# Patient Record
Sex: Male | Born: 1990 | Race: Black or African American | Hispanic: No | Marital: Single | Smoking: Current some day smoker
Health system: Southern US, Community
[De-identification: ages and names within clinical notes are randomized; demographics above are authoritative.]

## PROBLEM LIST (undated history)

## (undated) ENCOUNTER — Ambulatory Visit (HOSPITAL_COMMUNITY): Admission: EM | Payer: Medicaid Other | Source: Home / Self Care

## (undated) ENCOUNTER — Emergency Department (HOSPITAL_COMMUNITY): Admission: EM | Payer: Self-pay | Source: Home / Self Care

## (undated) DIAGNOSIS — F329 Major depressive disorder, single episode, unspecified: Secondary | ICD-10-CM

## (undated) DIAGNOSIS — F32A Depression, unspecified: Secondary | ICD-10-CM

## (undated) DIAGNOSIS — F209 Schizophrenia, unspecified: Secondary | ICD-10-CM

## (undated) HISTORY — PX: BACK SURGERY: SHX140

---
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
## 2011-01-11 LAB — COMPREHENSIVE METABOLIC PANEL
ALT: 14 U/L (ref 0–53)
AST: 24 U/L (ref 0–37)
Albumin: 4.6 g/dL (ref 3.5–5.2)
Alkaline Phosphatase: 62 U/L (ref 52–171)
BUN: 12 mg/dL (ref 6–23)
CO2: 24 mEq/L (ref 19–32)
Calcium: 9.5 mg/dL (ref 8.4–10.5)
Chloride: 105 mEq/L (ref 96–112)
Creatinine, Ser: 1.07 mg/dL (ref 0.4–1.5)
Glucose, Bld: 83 mg/dL (ref 70–99)
Potassium: 5 mEq/L (ref 3.5–5.1)
Sodium: 139 mEq/L (ref 135–145)
Total Bilirubin: 1.4 mg/dL — ABNORMAL HIGH (ref 0.3–1.2)
Total Protein: 7.1 g/dL (ref 6.0–8.3)

## 2011-01-11 LAB — DIFFERENTIAL
Basophils Absolute: 0 10*3/uL (ref 0.0–0.1)
Basophils Relative: 0 % (ref 0–1)
Eosinophils Absolute: 0.2 10*3/uL (ref 0.0–1.2)
Eosinophils Relative: 5 % (ref 0–5)
Lymphocytes Relative: 12 % — ABNORMAL LOW (ref 24–48)
Lymphs Abs: 0.6 10*3/uL — ABNORMAL LOW (ref 1.1–4.8)
Monocytes Absolute: 0.3 10*3/uL (ref 0.2–1.2)
Monocytes Relative: 6 % (ref 3–11)
Neutro Abs: 3.6 10*3/uL (ref 1.7–8.0)
Neutrophils Relative %: 77 % — ABNORMAL HIGH (ref 43–71)

## 2011-01-11 LAB — URINALYSIS, ROUTINE W REFLEX MICROSCOPIC
Bilirubin Urine: NEGATIVE
Glucose, UA: NEGATIVE mg/dL
Hgb urine dipstick: NEGATIVE
Ketones, ur: NEGATIVE mg/dL
Nitrite: NEGATIVE
Protein, ur: NEGATIVE mg/dL
Specific Gravity, Urine: 1.01 (ref 1.005–1.030)
Urobilinogen, UA: 0.2 mg/dL (ref 0.0–1.0)
pH: 6 (ref 5.0–8.0)

## 2011-01-11 LAB — CBC
HCT: 44.7 % (ref 36.0–49.0)
Hemoglobin: 15.6 g/dL (ref 12.0–16.0)
MCHC: 35 g/dL (ref 31.0–37.0)
MCV: 92.8 fL (ref 78.0–98.0)
Platelets: 192 10*3/uL (ref 150–400)
RBC: 4.82 MIL/uL (ref 3.80–5.70)
RDW: 13.4 % (ref 11.4–15.5)
WBC: 4.7 10*3/uL (ref 4.5–13.5)
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
## 2011-07-20 LAB — TYPE AND SCREEN
ABO/RH(D): A POS
Antibody Screen: NEGATIVE

## 2011-07-20 LAB — CBC
HCT: 39
HCT: 43.6
Hemoglobin: 13.5
Hemoglobin: 14.8 — ABNORMAL HIGH
MCHC: 34
MCHC: 34.7 — ABNORMAL HIGH
MCV: 90.2
MCV: 90.4
Platelets: 206
Platelets: 261
RBC: 4.33
RBC: 4.82
RDW: 13.3
RDW: 13.3
WBC: 10.8
WBC: 9

## 2011-07-20 LAB — I-STAT 8, (EC8 V) (CONVERTED LAB)
Acid-base deficit: 6 — ABNORMAL HIGH
BUN: 14
Bicarbonate: 23.2
Chloride: 103
Glucose, Bld: 114 — ABNORMAL HIGH
HCT: 47 — ABNORMAL HIGH
Hemoglobin: 16 — ABNORMAL HIGH
Operator id: 277751
Potassium: 3.6
Sodium: 139
TCO2: 25
pCO2, Ven: 58.7 — ABNORMAL HIGH
pH, Ven: 7.205 — ABNORMAL LOW

## 2011-07-20 LAB — ABO/RH: ABO/RH(D): A POS

## 2011-07-20 LAB — POCT I-STAT CREATININE
Creatinine, Ser: 1.1
Operator id: 277751

## 2011-07-20 LAB — PROTIME-INR
INR: 1
Prothrombin Time: 13.6

## 2011-10-20 ENCOUNTER — Emergency Department (HOSPITAL_COMMUNITY)
Admission: EM | Admit: 2011-10-20 | Discharge: 2011-10-21 | Discharge status: Against Medical Advice | Payer: Self-pay | Attending: Emergency Medicine | Admitting: Emergency Medicine

## 2011-10-20 ENCOUNTER — Encounter (HOSPITAL_COMMUNITY): Payer: Self-pay

## 2011-10-20 ENCOUNTER — Emergency Department (HOSPITAL_COMMUNITY)
Admission: EM | Admit: 2011-10-20 | Discharge: 2011-10-20 | Disposition: A | Discharge status: Home / Self Care | Payer: Self-pay | Attending: Emergency Medicine | Admitting: Emergency Medicine

## 2011-10-20 DIAGNOSIS — N342 Other urethritis: Secondary | ICD-10-CM | POA: Insufficient documentation

## 2011-10-20 DIAGNOSIS — R369 Urethral discharge, unspecified: Secondary | ICD-10-CM | POA: Insufficient documentation

## 2011-10-20 DIAGNOSIS — R109 Unspecified abdominal pain: Secondary | ICD-10-CM | POA: Insufficient documentation

## 2011-10-20 DIAGNOSIS — R110 Nausea: Secondary | ICD-10-CM | POA: Insufficient documentation

## 2011-10-20 MED ORDER — LIDOCAINE HCL 1 % IJ SOLN
INTRAMUSCULAR | Status: AC
  Administered 2011-10-20: 14:00:00
  Filled 2011-10-20: qty 20

## 2011-10-20 MED ORDER — PROMETHAZINE HCL 25 MG PO TABS
25.0000 mg | ORAL_TABLET | Freq: Once | ORAL | Status: DC
  Filled 2011-10-20: qty 1

## 2011-10-20 MED ORDER — AZITHROMYCIN 250 MG PO TABS
1000.0000 mg | ORAL_TABLET | Freq: Once | ORAL | Status: AC
  Administered 2011-10-20: 1000 mg via ORAL
  Filled 2011-10-20: qty 4

## 2011-10-20 MED ORDER — CEFTRIAXONE SODIUM 250 MG IJ SOLR
250.0000 mg | Freq: Once | INTRAMUSCULAR | Status: AC
  Administered 2011-10-20: 250 mg via INTRAMUSCULAR
  Filled 2011-10-20: qty 250

## 2011-10-20 NOTE — ED Notes (Signed)
Pt states he was just discharged and was given a PO antibiotic, since taking antibiotic pt has noted nausea and abd pain. Pt states he wants to be seen again for these new symptoms.

## 2011-10-20 NOTE — ED Notes (Signed)
EMS vitals: 140/60, 60.  Pain to testes upon palpation per pt report.

## 2011-10-20 NOTE — ED Notes (Signed)
Pt aware of need to stay 30 minutes post injection, pt verbalized understanding.

## 2011-10-20 NOTE — ED Provider Notes (Signed)
History     CSN: [id number]  Arrival date & time 10/20/11  1644   First MD Initiated Contact with Patient 10/20/11 1728      Chief Complaint  Patient presents with  . Abdominal Cramping  . Nausea    (Consider location/radiation/quality/duration/timing/severity/associated sxs/prior treatment) Patient is a 21 y.o. male presenting with cramps. The history is provided by the patient.  Abdominal Cramping The primary symptoms of the illness include abdominal pain and nausea. The primary symptoms of the illness do not include vomiting or diarrhea.  Symptoms associated with the illness do not include constipation.  Pt was seen here earlier today and txed for urethritis with IM rocephin and PO zithromax. He did take the zithromax with food but states he has had abd cramping and nausea since taking it. Denies diarrhea, vomiting.   No past medical history on file.  Past Surgical History  Procedure Date  . Back surgery     No family history on file.  History  Substance Use Topics  . Smoking status: Current Some Day Smoker  . Smokeless tobacco: Not on file  . Alcohol Use: Yes      Review of Systems  Constitutional: Negative.   Gastrointestinal: Positive for nausea and abdominal pain. Negative for vomiting, diarrhea, constipation and abdominal distention.  Genitourinary: Positive for discharge.  Skin: Negative.     Allergies  Review of patient's allergies indicates no known allergies.  Home Medications  No current outpatient prescriptions on file.  BP 122/68  Pulse 63  Temp(Src) 97.5 F (36.4 C) (Oral)  Resp 20  SpO2 100%  Physical Exam  Nursing note and vitals reviewed. Constitutional: He is oriented to person, place, and time. He appears well-developed and well-nourished. No distress.  HENT:  Head: Normocephalic and atraumatic.  Eyes: Conjunctivae and EOM are normal.  Neck: Normal range of motion.  Abdominal: Soft. Bowel sounds are normal. He exhibits no  distension and no mass. There is no tenderness. There is no rebound and no guarding.  Neurological: He is alert and oriented to person, place, and time.  Skin: He is not diaphoretic.    ED Course  Procedures (including critical care time)  Labs Reviewed - No data to display No results found.   1. Nausea       MDM  Pt with unremarkable abd exam, resting comfortably on exam. Order placed for antiemetic. He requests to eat.  Pt apparently left AMA prior to receiving antiemetic without informing ED staff.        Grant Fontana, [place] 10/20/11 2007

## 2011-10-20 NOTE — ED Notes (Signed)
Was just d/c from here.  Now c/o abd cramping w/nausea

## 2011-10-20 NOTE — ED Provider Notes (Signed)
History     CSN: [id number]  Arrival date & time 10/20/11  1318   First MD Initiated Contact with Patient 10/20/11 1336      Chief Complaint  Patient presents with  . Penile Discharge    white d/c x few days.  had unprotected sex last week.  . Groin Swelling    Rt testicle pain.    (Consider location/radiation/quality/duration/timing/severity/associated sxs/prior treatment) Patient is a 21 y.o. male presenting with penile discharge. The history is provided by the patient.  Penile Discharge Pertinent negatives include no abdominal pain.  pt c/o penile pain and discharge for past day. Constant, dull, non radiating. No hx same. Sexually active. No known std exposure. No scrotal or testicular pain. No abd pain. No fever or chills.   History reviewed. No pertinent past medical history.  Past Surgical History  Procedure Date  . Back surgery     History reviewed. No pertinent family history.  History  Substance Use Topics  . Smoking status: Current Some Day Smoker  . Smokeless tobacco: Not on file  . Alcohol Use: Yes      Review of Systems  Constitutional: Negative for fever and chills.  Gastrointestinal: Negative for vomiting and abdominal pain.  Genitourinary: Positive for discharge.  Skin: Negative for rash.    Allergies  Review of patient's allergies indicates not on file.  Home Medications  No current outpatient prescriptions on file.  BP 128/82  Pulse 63  Temp(Src) 98.3 F (36.8 C) (Oral)  Resp 16  Ht 5\' 5"  (1.651 m)  Wt 160 lb (72.576 kg)  BMI 26.63 kg/m2  SpO2 99%  Physical Exam  Nursing note and vitals reviewed. Constitutional: He is oriented to person, place, and time. He appears well-developed and well-nourished. No distress.  HENT:  Head: Atraumatic.  Eyes: Pupils are equal, round, and reactive to light.  Neck: Neck supple. No tracheal deviation present.  Cardiovascular: Normal rate.   Pulmonary/Chest: Effort normal. No accessory muscle  usage. No respiratory distress.  Abdominal: Soft. He exhibits no distension and no mass. There is no tenderness. There is no guarding.  Genitourinary:       Ext genitalia normal. No testicular or scrotal pain or tenderness. No ulcers. No parasites seen. No l/a.   Musculoskeletal: Normal range of motion.  Neurological: He is alert and oriented to person, place, and time.  Skin: Skin is warm and dry. No rash noted.  Psychiatric: He has a normal mood and affect.    ED Course  Procedures (including critical care time)   Labs Reviewed  GC/CHLAMYDIA PROBE AMP, GENITAL     MDM  Confirmed nkda w pt. Exam c/w urethritis. Rocephin im and zithromax po.         Suzi Roots, MD 10/20/11 1410

## 2011-10-21 NOTE — ED Provider Notes (Signed)
Medical screening examination/treatment/procedure(s) were performed by non-physician practitioner and as supervising physician I was immediately available for consultation/collaboration.   Sacha Topor A Jance Siek, MD 10/21/11 0050 

## 2011-10-23 LAB — GC/CHLAMYDIA PROBE AMP, GENITAL
Chlamydia, DNA Probe: NEGATIVE
GC Probe Amp, Genital: POSITIVE — AB

## 2011-10-24 NOTE — ED Notes (Signed)
+   Gonorrhea Patient treated with rocephin and zithromax.Chart appended per protocol MD.

## 2011-10-27 ENCOUNTER — Telehealth (HOSPITAL_COMMUNITY): Payer: Self-pay | Admitting: *Deleted

## 2011-10-28 ENCOUNTER — Telehealth (HOSPITAL_COMMUNITY): Payer: Self-pay | Admitting: Emergency Medicine

## 2011-10-28 NOTE — ED Notes (Signed)
Made several attempts to contact patient; Letter being sent to Epic address

## 2011-10-30 NOTE — ED Notes (Signed)
Letter sent to Epic address 1/21

## 2013-02-06 ENCOUNTER — Emergency Department (HOSPITAL_COMMUNITY)
Admission: EM | Admit: 2013-02-06 | Discharge: 2013-02-07 | Disposition: A | Discharge status: Home / Self Care | Payer: Self-pay | Attending: Emergency Medicine | Admitting: Emergency Medicine

## 2013-02-06 ENCOUNTER — Encounter (HOSPITAL_COMMUNITY): Payer: Self-pay | Admitting: *Deleted

## 2013-02-06 DIAGNOSIS — L299 Pruritus, unspecified: Secondary | ICD-10-CM | POA: Insufficient documentation

## 2013-02-06 DIAGNOSIS — T7840XA Allergy, unspecified, initial encounter: Secondary | ICD-10-CM

## 2013-02-06 DIAGNOSIS — R21 Rash and other nonspecific skin eruption: Secondary | ICD-10-CM | POA: Insufficient documentation

## 2013-02-06 DIAGNOSIS — F17200 Nicotine dependence, unspecified, uncomplicated: Secondary | ICD-10-CM | POA: Insufficient documentation

## 2013-02-06 NOTE — ED Notes (Addendum)
Pt states he started itching on his chest earlier today, but does not know an approximate time.  Pt states now that he is having itching in his groin.  Pt given 25 mg benadryl by [facility]

## 2013-02-06 NOTE — ED Provider Notes (Signed)
History     CSN: [id number]  Arrival date & time 02/06/13  2310   None     Chief Complaint  Patient presents with  . Allergic Reaction  . Pruritis    (Consider location/radiation/quality/duration/timing/severity/associated sxs/prior treatment) HPI Comments: She presents for sudden onset of an itchy rash x 2 hours. Patient states that he was at the laundry mat when all of a sudden he started feeling itchy all over. Patient states the itching sensation was constant without any aggravating or alleviating factors. Patient denies any new medications as well as eating or drinking prior to onset. Patient states the rash he experienced was on his forehead and was "bumpy". Patient denies fever, vision changes, difficulty swallowing or speaking, drooling, shortness of breath, chest pain, nausea or vomiting, lightheadedness or dizziness, syncope, numbness or tingling in her extremities. Patient further denies illicit drug use. Patient given 50mg  Benadryl by EMS PTA.  Patient is a 22 y.o. male presenting with allergic reaction. The history is provided by the patient. No language interpreter was used.  Allergic Reaction The primary symptoms are  rash. The primary symptoms do not include shortness of breath, nausea or vomiting.    History reviewed. No pertinent past medical history.  Past Surgical History  Procedure Laterality Date  . Back surgery      No family history on file.  History  Substance Use Topics  . Smoking status: Current Some Day Smoker  . Smokeless tobacco: Not on file  . Alcohol Use: Yes     Review of Systems  Constitutional: Negative for fever.  HENT: Negative for drooling, trouble swallowing and voice change.   Eyes: Negative for visual disturbance.  Respiratory: Negative for shortness of breath.   Gastrointestinal: Negative for nausea and vomiting.  Skin: Positive for rash.       +diffuse itching  Neurological: Negative for syncope, weakness and numbness.  All  other systems reviewed and are negative.    Allergies  Shellfish allergy  Home Medications  No current outpatient prescriptions on file.  BP 137/76  Pulse 71  Temp(Src) 97 F (36.1 C) (Oral)  Resp 16  Ht 5\' 5"  (1.651 m)  Wt 170 lb (77.111 kg)  BMI 28.29 kg/m2  SpO2 99%  Physical Exam  Nursing note and vitals reviewed. Constitutional: He is oriented to person, place, and time. He appears well-developed and well-nourished.  HENT:  Head: Normocephalic and atraumatic.  Mouth/Throat: Oropharynx is clear and moist. No oropharyngeal exudate.  Eyes: Conjunctivae are normal. Pupils are equal, round, and reactive to light. No scleral icterus.  Neck: Normal range of motion. Neck supple.  Cardiovascular: Normal rate, regular rhythm, normal heart sounds and intact distal pulses.   Pulmonary/Chest: Effort normal and breath sounds normal. No respiratory distress. He has no wheezes. He has no rales.  Abdominal: Soft. He exhibits no distension. There is no tenderness. There is no rebound and no guarding.  Musculoskeletal: Normal range of motion. He exhibits no edema.  Neurological: He is alert and oriented to person, place, and time.  Skin: Skin is warm and dry. No rash noted. No erythema. No pallor.  Psychiatric: He has a normal mood and affect. His behavior is normal.    ED Course  Procedures (including critical care time)  Labs Reviewed - No data to display No results found.   1. Allergic reaction, initial encounter     MDM  Patient presents for sudden onset of diffuse itching with rash while at the laundry mat tonight.  Patient unable to identify a trigger for symptoms and denies illicit drug use, throat swelling, difficulty swallowing, drooling, or shortness of breath. On physical exam patient is well and nontoxic appearing without tachycardia, tachypnea, dyspnea, or hypoxia. Lungs clear to auscultation bilaterally. Patient given 2 mg Benadryl by EMS prior to arrival. IV Pepcid,  Zofran, and IV fluid bolus ordered. Will continue to monitor.  Patient endorses improvement in symptoms with Benadryl, Pepcid, and IV fluids. Patient has remained well and nontoxic appearing, in no acute distress, and hemodynamically stable during monitoring in ED for 2.5 hours. Appropriate for discharge with primary care follow up for further evaluation of symptoms. Resource guide provided. Indications for ED return discussed. Patient states comfort and understanding with this discharge plan with no unaddressed concerns.   Antony Madura, PA-C 02/11/13 1515

## 2013-02-07 ENCOUNTER — Encounter (HOSPITAL_COMMUNITY): Payer: Self-pay | Admitting: *Deleted

## 2013-02-07 ENCOUNTER — Emergency Department (HOSPITAL_COMMUNITY)
Admission: EM | Admit: 2013-02-07 | Discharge: 2013-02-07 | Disposition: A | Discharge status: Home / Self Care | Payer: Self-pay | Attending: Emergency Medicine | Admitting: Emergency Medicine

## 2013-02-07 DIAGNOSIS — R5381 Other malaise: Secondary | ICD-10-CM | POA: Insufficient documentation

## 2013-02-07 DIAGNOSIS — Z5900 Homelessness unspecified: Secondary | ICD-10-CM | POA: Insufficient documentation

## 2013-02-07 DIAGNOSIS — R5383 Other fatigue: Secondary | ICD-10-CM

## 2013-02-07 DIAGNOSIS — F17200 Nicotine dependence, unspecified, uncomplicated: Secondary | ICD-10-CM | POA: Insufficient documentation

## 2013-02-07 MED ORDER — SODIUM CHLORIDE 0.9 % IV BOLUS (SEPSIS)
1000.0000 mL | Freq: Once | INTRAVENOUS | Status: AC
  Administered 2013-02-07: 1000 mL via INTRAVENOUS

## 2013-02-07 MED ORDER — FAMOTIDINE IN NACL 20-0.9 MG/50ML-% IV SOLN
20.0000 mg | Freq: Once | INTRAVENOUS | Status: AC
  Administered 2013-02-07: 20 mg via INTRAVENOUS
  Filled 2013-02-07: qty 50

## 2013-02-07 MED ORDER — ONDANSETRON HCL 4 MG/2ML IJ SOLN
4.0000 mg | INTRAMUSCULAR | Status: AC
  Administered 2013-02-07: 4 mg via INTRAVENOUS
  Filled 2013-02-07: qty 2

## 2013-02-07 MED ORDER — DIPHENHYDRAMINE HCL 50 MG/ML IJ SOLN: 25.0000 mg | Freq: Once | INTRAMUSCULAR | Status: DC

## 2013-02-07 NOTE — ED Notes (Signed)
Pt requesting food

## 2013-02-07 NOTE — ED Provider Notes (Signed)
History     CSN: [id number]  Arrival date & time 02/07/13  6213   First MD Initiated Contact with Patient 02/07/13 [phone]      Chief Complaint  Patient presents with  . Allergic Reaction    (Consider location/radiation/quality/duration/timing/severity/associated sxs/prior treatment) HPI Comments: [name] is a 22 y.o. Male who returns to the ED after being treated with Benadryl and Pepcid for a groin rash, because he is sleepy. He did not leave the facility after the discharge at 0246 this morning. He slept in the lobby for a while and decided to check back in, he complains of feeling "woozy. He states the rash is better. He is homeless. He has no additional complaints. There are no other known modifying factors.  Patient is a 22 y.o. male presenting with allergic reaction. The history is provided by the patient.  Allergic Reaction    History reviewed. No pertinent past medical history.  Past Surgical History  Procedure Laterality Date  . Back surgery      No family history on file.  History  Substance Use Topics  . Smoking status: Current Some Day Smoker  . Smokeless tobacco: Not on file  . Alcohol Use: Yes      Review of Systems  All other systems reviewed and are negative.    Allergies  Shellfish allergy  Home Medications  No current outpatient prescriptions on file.  BP 125/77  Pulse 50  Temp(Src) 98 F (36.7 C) (Oral)  Resp 18  SpO2 100%  Physical Exam  Nursing note and vitals reviewed. Constitutional: He is oriented to person, place, and time. He appears well-developed and well-nourished.  HENT:  Head: Normocephalic and atraumatic.  Right Ear: External ear normal.  Left Ear: External ear normal.  Eyes: Conjunctivae and EOM are normal. Pupils are equal, round, and reactive to light.  Neck: Normal range of motion and phonation normal. Neck supple.  Cardiovascular: Normal rate, regular rhythm, normal heart sounds and intact distal pulses.    Pulmonary/Chest: Effort normal and breath sounds normal. He exhibits no bony tenderness.  Abdominal: Soft. Normal appearance. There is no tenderness.  Musculoskeletal: Normal range of motion.  Neurological: He is alert and oriented to person, place, and time. He has normal strength. No cranial nerve deficit or sensory deficit. He exhibits normal muscle tone. Coordination normal.  Skin: Skin is warm, dry and intact.  Psychiatric: He has a normal mood and affect. His behavior is normal. Judgment and thought content normal.    ED Course  Procedures (including critical care time)  He is offered nutrition in emergency department.     Labs Reviewed - No data to display No results found.   1. Lethargy   2. Homeless       MDM  Nonspecific rash. Pt is homeless. Doubt metabolic instability, serious bacterial infection or impending vascular collapse; the patient is stable for discharge.  Plan: Home Medications- Benadryl prn; Home Treatments- rest; Recommended follow up- pcp prn        Flint Melter, MD 02/07/13 2207

## 2013-02-07 NOTE — ED Notes (Signed)
Pt given graham cracker, peanut butter, and apple juice. Pt states "i will get it i just want to sleep right now."

## 2013-02-07 NOTE — ED Notes (Signed)
Pt discharged.Vital signs stable and GCS 15 

## 2013-02-07 NOTE — ED Notes (Signed)
Pt states that he was seen here last night for an allergic reaction. Pt stayed in waiting room after discharge and tried to sleep. Pt checked back in due to feeling "woozy" and needing to sleep. Pt states that he feel nauseated because he has not eaten. Pt received pepcid, zofran, and fluids last night. NAD noted. A&Ox4

## 2013-02-11 NOTE — ED Provider Notes (Signed)
Medical screening examination/treatment/procedure(s) were performed by non-physician practitioner and as supervising physician I was immediately available for consultation/collaboration.   Anjeanette Petzold L Arsen Mangione, MD 02/11/13 1855 

## 2013-03-06 ENCOUNTER — Encounter (HOSPITAL_COMMUNITY): Payer: Self-pay | Admitting: Emergency Medicine

## 2013-03-06 ENCOUNTER — Emergency Department (HOSPITAL_COMMUNITY)
Admission: EM | Admit: 2013-03-06 | Discharge: 2013-03-06 | Disposition: A | Discharge status: Home / Self Care | Payer: Self-pay | Attending: Emergency Medicine | Admitting: Emergency Medicine

## 2013-03-06 DIAGNOSIS — R05 Cough: Secondary | ICD-10-CM | POA: Insufficient documentation

## 2013-03-06 DIAGNOSIS — R059 Cough, unspecified: Secondary | ICD-10-CM | POA: Insufficient documentation

## 2013-03-06 DIAGNOSIS — M25569 Pain in unspecified knee: Secondary | ICD-10-CM | POA: Insufficient documentation

## 2013-03-06 DIAGNOSIS — R062 Wheezing: Secondary | ICD-10-CM | POA: Insufficient documentation

## 2013-03-06 DIAGNOSIS — J9801 Acute bronchospasm: Secondary | ICD-10-CM | POA: Insufficient documentation

## 2013-03-06 DIAGNOSIS — F17200 Nicotine dependence, unspecified, uncomplicated: Secondary | ICD-10-CM | POA: Insufficient documentation

## 2013-03-06 DIAGNOSIS — G8929 Other chronic pain: Secondary | ICD-10-CM | POA: Insufficient documentation

## 2013-03-06 MED ORDER — ALBUTEROL SULFATE HFA 108 (90 BASE) MCG/ACT IN AERS
2.0000 | INHALATION_SPRAY | RESPIRATORY_TRACT | Status: DC | PRN
  Administered 2013-03-06: 2 via RESPIRATORY_TRACT
  Filled 2013-03-06: qty 6.7

## 2013-03-06 MED ORDER — ONDANSETRON 8 MG PO TBDP
8.0000 mg | ORAL_TABLET | Freq: Once | ORAL | Status: AC
  Administered 2013-03-06: 8 mg via ORAL
  Filled 2013-03-06: qty 1

## 2013-03-06 NOTE — ED Provider Notes (Signed)
History     CSN: [id number]  Arrival date & time 03/06/13  0208   First MD Initiated Contact with Patient 03/06/13 0243      Chief Complaint  Patient presents with  . Shortness of Breath  . Leg Pain   HPI  History provided by the patient. Patient is a 22 year old male with no significant PMH who presents with complaints of shortness of breath and wheezing symptoms as well as complaints of knee pain. Patient is a poor historian but states that he has had difficulties with his breathing and wheezing for a long period of time. He states that he will often have nightly awakening from shortness of breath. This evening patient states that he was smoking a cigarette and shortly after began having some shortness of breath and coughing. He was transported by EMS and was given 5 mg albuterol with improvement of symptoms. He denies having any chest pain. Currently symptoms are improved. Patient also complains of some chronic knee pains. He states since he is here he is wondering why he has pain in his knee and difficulty moving. Denies any injury or trauma. Symptoms have been going on for many months to years. He does not use any treatment for his symptoms. He is unable to tell of any specific aggravating or alleviating factors besides moving and walking. Denies any associated numbness or weakness in the foot. Denies any swelling. No skin changes.     History reviewed. No pertinent past medical history.  Past Surgical History  Procedure Laterality Date  . Back surgery      History reviewed. No pertinent family history.  History  Substance Use Topics  . Smoking status: Current Some Day Smoker  . Smokeless tobacco: Not on file  . Alcohol Use: Yes     Comment: occ      Review of Systems  Constitutional: Negative for fever, chills and diaphoresis.  Respiratory: Positive for cough, shortness of breath and wheezing.   Cardiovascular: Negative for chest pain and leg swelling.   Musculoskeletal:       Knee pain  All other systems reviewed and are negative.    Allergies  Shellfish allergy  Home Medications  No current outpatient prescriptions on file.  BP 129/70  Pulse 60  Temp(Src) 98.3 F (36.8 C) (Oral)  Resp 20  SpO2 99%  Physical Exam  Nursing note and vitals reviewed. Constitutional: He is oriented to person, place, and time. He appears well-developed and well-nourished. No distress.  HENT:  Head: Normocephalic.  Cardiovascular: Normal rate and regular rhythm.   Pulmonary/Chest: Effort normal. No respiratory distress. He has wheezes. He has no rales. He exhibits no tenderness.  Musculoskeletal: Normal range of motion. He exhibits no edema and no tenderness.  Patient has full range of motion at the knee and lower extremity joints. There is no swelling or deformity. No increased laxity with valgus or varus  stress. Negative anterior and posterior drawer test. Normal distal pulses and foot. Normal sensations.  Neurological: He is alert and oriented to person, place, and time.  Skin: Skin is warm.  Psychiatric: He has a normal mood and affect. His behavior is normal.    ED Course  Procedures      1. Bronchospasm       MDM  3:00 AM patient seen and evaluated. The patient appears drowsy but awakes easily in no acute distress. Normal respirations O2 sats. Patient is a current smoker. There is perhaps slight wheezing but no significant respiratory  problems. patient also admits to drinking. Prior arrival.   Knee exam is normal.        Angus Seller, PA-C 03/06/13 0407

## 2013-03-06 NOTE — ED Notes (Signed)
PA at bedside.

## 2013-03-06 NOTE — ED Notes (Signed)
Per EMS pt started c/o shortness of breath after smoking a cigarette about an hour ago  Pt was given albuterol 5mg  prior to arrival  No wheezing noted after breathing treatment  Pt states he has had this problem in the past  No acute distress noted upon arrival

## 2013-03-06 NOTE — ED Notes (Signed)
Pt states he wakes up wheezing every night and states this has been going on for a while now  Pt is also c/o right leg pain that he states has been also going on for a while  Pt states it hurt him to bend his right leg  Denies injury

## 2013-03-07 NOTE — ED Provider Notes (Signed)
Medical screening examination/treatment/procedure(s) were performed by non-physician practitioner and as supervising physician I was immediately available for consultation/collaboration.  Christepher Melchior, MD 03/07/13 0449 

## 2013-09-11 ENCOUNTER — Encounter (HOSPITAL_COMMUNITY): Payer: Self-pay | Admitting: Emergency Medicine

## 2013-09-11 ENCOUNTER — Emergency Department (HOSPITAL_COMMUNITY)
Admission: EM | Admit: 2013-09-11 | Discharge: 2013-09-11 | Disposition: A | Discharge status: Home / Self Care | Payer: Self-pay | Attending: Emergency Medicine | Admitting: Emergency Medicine

## 2013-09-11 ENCOUNTER — Emergency Department (HOSPITAL_COMMUNITY): Payer: Self-pay

## 2013-09-11 DIAGNOSIS — M25569 Pain in unspecified knee: Secondary | ICD-10-CM | POA: Insufficient documentation

## 2013-09-11 DIAGNOSIS — M79604 Pain in right leg: Secondary | ICD-10-CM

## 2013-09-11 DIAGNOSIS — F17200 Nicotine dependence, unspecified, uncomplicated: Secondary | ICD-10-CM | POA: Insufficient documentation

## 2013-09-11 MED ORDER — IBUPROFEN 800 MG PO TABS
800.0000 mg | ORAL_TABLET | Freq: Once | ORAL | Status: AC
  Administered 2013-09-11: 800 mg via ORAL
  Filled 2013-09-11: qty 1

## 2013-09-11 MED ORDER — IBUPROFEN 600 MG PO TABS: 600.0000 mg | ORAL_TABLET | Freq: Four times a day (QID) | ORAL | Status: DC | PRN

## 2013-09-11 NOTE — ED Notes (Signed)
Per EMS pt walking to homeless shelter and did not get to shelter in time. Pt reported having right leg pain and knee will not work. Pt was able to walk to ambulance truck.

## 2013-09-11 NOTE — ED Provider Notes (Signed)
CSN: [id number]     Arrival date & time 09/11/13  0102 History   First MD Initiated Contact with Patient 09/11/13 0103     Chief Complaint  Patient presents with  . Leg Pain   (Consider location/radiation/quality/duration/timing/severity/associated sxs/prior Treatment) HPI [name] is a 22 y.o. male who presents to emergency department complaining of right leg pain. Patient states he has pain all over in his legs. Patient states that he has not had any injuries but he has been walking a lot. He denies any fever, chills, leg swelling. States he has trouble bending his leg at the knee. States oral for a while but does not know why. According to EMS patient was walking to a homeless shelter but did not get there in time, called out because he was having leg pain. He was able to walk to the ambulance with no problems. Patient did not take any medications prior to coming in for this.  History reviewed. No pertinent past medical history. Past Surgical History  Procedure Laterality Date  . Back surgery     History reviewed. No pertinent family history. History  Substance Use Topics  . Smoking status: Current Some Day Smoker  . Smokeless tobacco: Not on file  . Alcohol Use: Yes     Comment: occ    Review of Systems  Constitutional: Negative for fever and chills.  Respiratory: Negative for cough, chest tightness and shortness of breath.   Cardiovascular: Negative for chest pain, palpitations and leg swelling.  Gastrointestinal: Negative for abdominal distention.  Musculoskeletal: Positive for arthralgias and myalgias. Negative for neck pain and neck stiffness.  Skin: Negative for rash.  Allergic/Immunologic: Negative for immunocompromised state.  Neurological: Negative for dizziness, weakness, light-headedness, numbness and headaches.    Allergies  Shellfish allergy  Home Medications  No current outpatient prescriptions on file. BP 131/82  Pulse 67  Temp(Src) 97.3 F (36.3 C)  (Oral)  Resp 19  SpO2 98% Physical Exam  Nursing note and vitals reviewed. Constitutional: He appears well-developed and well-nourished. No distress.  HENT:  Head: Normocephalic and atraumatic.  Eyes: Conjunctivae are normal.  Neck: Neck supple.  Cardiovascular: Normal rate, regular rhythm and normal heart sounds.   Pulmonary/Chest: Effort normal. No respiratory distress. He has no wheezes. He has no rales.  Musculoskeletal: He exhibits no edema.  Normal appearing right leg. Pt points to the shin area when asked when pain is. He is only able to flex right knee joint to 70 degrees, unable to flex more due to pain. No knee joint swelling or effusion. Normal calf with no tenderness. Normal ankle. Dorsal pedal pulses intact.   Neurological: He is alert.  Skin: Skin is warm and dry.    ED Course  Procedures (including critical care time) Labs Review Labs Reviewed - No data to display Imaging Review Dg Knee Complete 4 Views Right  09/11/2013   CLINICAL DATA:  Pain.  No injury.  EXAM: RIGHT KNEE - COMPLETE 4+ VIEW  COMPARISON:  None.  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 No acute fracture or dislocation. No joint effusion. The lateral views are mildly oblique. Joint spaces maintained.  IMPRESSION: No acute osseous abnormality.   Electronically Signed   By: Jeronimo Greaves M.D.   On: 09/11/2013 01:43    EKG Interpretation   None       MDM   1. Right leg pain     X-ray negative. No signs of  joint effusion, no signs of cellulitis. Dorsal pedal pulses intact. Feet are pink, warm. No edema. No signs of DVT. No wounds. At this time, will d/c home with ibuprofen and follow up.   Pt states he is homeless and has no where to go. Advised he can stay in the waiting room, or go to a shelter or family/friends.   Filed Vitals:   09/11/13 0102  BP: 131/82  Pulse: 67  Temp: 97.3 F (36.3 C)  TempSrc: Oral  Resp: 19  SpO2: 98%        Chalee Hirota A Arianah Torgeson, PA-C 09/11/13 0202

## 2013-09-12 NOTE — ED Provider Notes (Signed)
Medical screening examination/treatment/procedure(s) were performed by non-physician practitioner and as supervising physician I was immediately available for consultation/collaboration.  Kenny Stern T Everline Mahaffy, MD 09/12/13 0522 

## 2015-03-25 ENCOUNTER — Emergency Department (HOSPITAL_COMMUNITY)
Admission: EM | Admit: 2015-03-25 | Discharge: 2015-03-25 | Disposition: A | Discharge status: Home / Self Care | Payer: Self-pay | Attending: Emergency Medicine | Admitting: Emergency Medicine

## 2015-03-25 ENCOUNTER — Encounter (HOSPITAL_COMMUNITY): Payer: Self-pay | Admitting: *Deleted

## 2015-03-25 DIAGNOSIS — Z720 Tobacco use: Secondary | ICD-10-CM | POA: Insufficient documentation

## 2015-03-25 DIAGNOSIS — R1010 Upper abdominal pain, unspecified: Secondary | ICD-10-CM | POA: Insufficient documentation

## 2015-03-25 DIAGNOSIS — R197 Diarrhea, unspecified: Secondary | ICD-10-CM | POA: Insufficient documentation

## 2015-03-25 DIAGNOSIS — R112 Nausea with vomiting, unspecified: Secondary | ICD-10-CM | POA: Insufficient documentation

## 2015-03-25 LAB — COMPREHENSIVE METABOLIC PANEL
ALT: 15 U/L — ABNORMAL LOW (ref 17–63)
AST: 21 U/L (ref 15–41)
Albumin: 4.2 g/dL (ref 3.5–5.0)
Alkaline Phosphatase: 41 U/L (ref 38–126)
Anion gap: 7 (ref 5–15)
BUN: 11 mg/dL (ref 6–20)
CO2: 28 mmol/L (ref 22–32)
Calcium: 9.3 mg/dL (ref 8.9–10.3)
Chloride: 104 mmol/L (ref 101–111)
Creatinine, Ser: 1.29 mg/dL — ABNORMAL HIGH (ref 0.61–1.24)
GFR calc Af Amer: >60 mL/min (ref >60)
GFR calc non Af Amer: >60 mL/min (ref >60)
Glucose, Bld: 96 mg/dL (ref 65–99)
Potassium: 4.5 mmol/L (ref 3.5–5.1)
Sodium: 139 mmol/L (ref 135–145)
Total Bilirubin: 0.7 mg/dL (ref 0.3–1.2)
Total Protein: 6.6 g/dL (ref 6.5–8.1)

## 2015-03-25 LAB — CBC WITH DIFFERENTIAL/PLATELET
Basophils Absolute: 0 10*3/uL (ref 0.0–0.1)
Basophils Relative: 0 % (ref 0–1)
Eosinophils Absolute: 0.2 10*3/uL (ref 0.0–0.7)
Eosinophils Relative: 2 % (ref 0–5)
HCT: 44.8 % (ref 39.0–52.0)
Hemoglobin: 15.7 g/dL (ref 13.0–17.0)
Lymphocytes Relative: 9 % — ABNORMAL LOW (ref 12–46)
Lymphs Abs: 0.8 10*3/uL (ref 0.7–4.0)
MCH: 31 pg (ref 26.0–34.0)
MCHC: 35 g/dL (ref 30.0–36.0)
MCV: 88.5 fL (ref 78.0–100.0)
Monocytes Absolute: 0.7 10*3/uL (ref 0.1–1.0)
Monocytes Relative: 7 % (ref 3–12)
Neutro Abs: 7.1 10*3/uL (ref 1.7–7.7)
Neutrophils Relative %: 82 % — ABNORMAL HIGH (ref 43–77)
Platelets: 220 10*3/uL (ref 150–400)
RBC: 5.06 MIL/uL (ref 4.22–5.81)
RDW: 13.1 % (ref 11.5–15.5)
WBC: 8.8 10*3/uL (ref 4.0–10.5)

## 2015-03-25 LAB — URINE MICROSCOPIC-ADD ON

## 2015-03-25 LAB — RAPID URINE DRUG SCREEN, HOSP PERFORMED
Amphetamines: NOT DETECTED
Barbiturates: NOT DETECTED
Benzodiazepines: POSITIVE — AB
Cocaine: NOT DETECTED
Opiates: NOT DETECTED
Tetrahydrocannabinol: POSITIVE — AB

## 2015-03-25 LAB — URINALYSIS, ROUTINE W REFLEX MICROSCOPIC
Bilirubin Urine: NEGATIVE
Glucose, UA: NEGATIVE mg/dL
Hgb urine dipstick: NEGATIVE
Ketones, ur: NEGATIVE mg/dL
Nitrite: NEGATIVE
Protein, ur: NEGATIVE mg/dL
Specific Gravity, Urine: 1.025 (ref 1.005–1.030)
Urobilinogen, UA: 1 mg/dL (ref 0.0–1.0)
pH: 8 (ref 5.0–8.0)

## 2015-03-25 LAB — LIPASE, BLOOD: Lipase: 28 U/L (ref 22–51)

## 2015-03-25 LAB — ETHANOL: Alcohol, Ethyl (B): <5 mg/dL (ref <5)

## 2015-03-25 MED ORDER — PROMETHAZINE HCL 25 MG PO TABS: 25.0000 mg | ORAL_TABLET | Freq: Four times a day (QID) | ORAL | Status: DC | PRN

## 2015-03-25 MED ORDER — GI COCKTAIL ~~LOC~~
30.0000 mL | Freq: Once | ORAL | Status: AC
  Administered 2015-03-25: 30 mL via ORAL
  Filled 2015-03-25: qty 30

## 2015-03-25 MED ORDER — ONDANSETRON HCL 4 MG/2ML IJ SOLN
4.0000 mg | Freq: Once | INTRAMUSCULAR | Status: AC
  Administered 2015-03-25: 4 mg via INTRAVENOUS
  Filled 2015-03-25: qty 2

## 2015-03-25 MED ORDER — SODIUM CHLORIDE 0.9 % IV BOLUS (SEPSIS)
1000.0000 mL | Freq: Once | INTRAVENOUS | Status: AC
  Administered 2015-03-25: 1000 mL via INTRAVENOUS

## 2015-03-25 NOTE — ED Notes (Signed)
Pt is here with upper abdominal pain about one day ago and reports vomiting and diarrhea.  Denies everyday use of etoh.

## 2015-03-25 NOTE — Discharge Instructions (Signed)
Follow-up with the resources you have been given by social work  Abdominal Pain Many things can cause abdominal pain. Usually, abdominal pain is not caused by a disease and will improve without treatment. It can often be observed and treated at home. Your health care provider will do a physical exam and possibly order blood tests and X-rays to help determine the seriousness of your pain. However, in many cases, more time must pass before a clear cause of the pain can be found. Before that point, your health care provider may not know if you need more testing or further treatment. HOME CARE INSTRUCTIONS  Monitor your abdominal pain for any changes. The following actions may help to alleviate any discomfort you are experiencing:  Only take over-the-counter or prescription medicines as directed by your health care provider.  Do not take laxatives unless directed to do so by your health care provider.  Try a clear liquid diet (broth, tea, or water) as directed by your health care provider. Slowly move to a bland diet as tolerated. SEEK MEDICAL CARE IF:  You have unexplained abdominal pain.  You have abdominal pain associated with nausea or diarrhea.  You have pain when you urinate or have a bowel movement.  You experience abdominal pain that wakes you in the night.  You have abdominal pain that is worsened or improved by eating food.  You have abdominal pain that is worsened with eating fatty foods.  You have a fever. SEEK IMMEDIATE MEDICAL CARE IF:   Your pain does not go away within 2 hours.  You keep throwing up (vomiting).  Your pain is felt only in portions of the abdomen, such as the right side or the left lower portion of the abdomen.  You pass bloody or black tarry stools. MAKE SURE YOU:  Understand these instructions.   Will watch your condition.   Will get help right away if you are not doing well or get worse.  Document Released: 07/05/2005 Document Revised:  09/30/2013 Document Reviewed: 06/04/2013 [facility] Patient Information 2015 [place], [place]. This information is not intended to replace advice given to you by your health care provider. Make sure you discuss any questions you have with your health care provider.  Nausea and Vomiting Nausea is a sick feeling that often comes before throwing up (vomiting). Vomiting is a reflex where stomach contents come out of your mouth. Vomiting can cause severe loss of body fluids (dehydration). Children and elderly adults can become dehydrated quickly, especially if they also have diarrhea. Nausea and vomiting are symptoms of a condition or disease. It is important to find the cause of your symptoms. CAUSES   Direct irritation of the stomach lining. This irritation can result from increased acid production (gastroesophageal reflux disease), infection, food poisoning, taking certain medicines (such as nonsteroidal anti-inflammatory drugs), alcohol use, or tobacco use.  Signals from the brain.These signals could be caused by a headache, heat exposure, an inner ear disturbance, increased pressure in the brain from injury, infection, a tumor, or a concussion, pain, emotional stimulus, or metabolic problems.  An obstruction in the gastrointestinal tract (bowel obstruction).  Illnesses such as diabetes, hepatitis, gallbladder problems, appendicitis, kidney problems, cancer, sepsis, atypical symptoms of a heart attack, or eating disorders.  Medical treatments such as chemotherapy and radiation.  Receiving medicine that makes you sleep (general anesthetic) during surgery. DIAGNOSIS Your caregiver may ask for tests to be done if the problems do not improve after a few days. Tests may also be  done if symptoms are severe or if the reason for the nausea and vomiting is not clear. Tests may include:  Urine tests.  Blood tests.  Stool tests.  Cultures (to look for evidence of infection).  X-rays or other imaging  studies. Test results can help your caregiver make decisions about treatment or the need for additional tests. TREATMENT You need to stay well hydrated. Drink frequently but in small amounts.You may wish to drink water, sports drinks, clear broth, or eat frozen ice pops or gelatin dessert to help stay hydrated.When you eat, eating slowly may help prevent nausea.There are also some antinausea medicines that may help prevent nausea. HOME CARE INSTRUCTIONS   Take all medicine as directed by your caregiver.  If you do not have an appetite, do not force yourself to eat. However, you must continue to drink fluids.  If you have an appetite, eat a normal diet unless your caregiver tells you differently.  Eat a variety of complex carbohydrates (rice, wheat, potatoes, bread), lean meats, yogurt, fruits, and vegetables.  Avoid high-fat foods because they are more difficult to digest.  Drink enough water and fluids to keep your urine clear or pale yellow.  If you are dehydrated, ask your caregiver for specific rehydration instructions. Signs of dehydration may include:  Severe thirst.  Dry lips and mouth.  Dizziness.  Dark urine.  Decreasing urine frequency and amount.  Confusion.  Rapid breathing or pulse. SEEK IMMEDIATE MEDICAL CARE IF:   You have blood or brown flecks (like coffee grounds) in your vomit.  You have black or bloody stools.  You have a severe headache or stiff neck.  You are confused.  You have severe abdominal pain.  You have chest pain or trouble breathing.  You do not urinate at least once every 8 hours.  You develop cold or clammy skin.  You continue to vomit for longer than 24 to 48 hours.  You have a fever. MAKE SURE YOU:   Understand these instructions.  Will watch your condition.  Will get help right away if you are not doing well or get worse. Document Released: 09/25/2005 Document Revised: 12/18/2011 Document Reviewed:  02/22/2011 [facility] Patient Information 2015 [place], [place]. This information is not intended to replace advice given to you by your health care provider. Make sure you discuss any questions you have with your health care provider.

## 2015-03-25 NOTE — ED Provider Notes (Signed)
CSN: [id number]     Arrival date & time 03/25/15  1132 History   First MD Initiated Contact with Patient 03/25/15 1145     Chief Complaint  Patient presents with  . Abdominal Pain  . Emesis    Low 5 caveat due to uncooperativeness (Consider location/radiation/quality/duration/timing/severity/associated sxs/prior Treatment) Patient is a 24 y.o. male presenting with abdominal pain and vomiting. The history is provided by the patient and a parent.  Abdominal Pain Associated symptoms: diarrhea and vomiting   Associated symptoms: no fever   Emesis Associated symptoms: abdominal pain and diarrhea    patient presents with nausea and vomiting and abdominal pain. Reportedly has had some fecal incontinence also. Has been going for last couple days. Denies fevers. He is somewhat difficult historian and will not give much history. Patient's mother is with him and states that he has been on disability and they are moving down from [place] and that he will need to see psychiatry to get on some medications for depression and ADHD. Patient denies substance abuse.  History reviewed. No pertinent past medical history. Past Surgical History  Procedure Laterality Date  . Back surgery     No family history on file. History  Substance Use Topics  . Smoking status: Current Some Day Smoker  . Smokeless tobacco: Not on file  . Alcohol Use: Yes     Comment: occ    Review of Systems  Unable to perform ROS Constitutional: Negative for fever.  Gastrointestinal: Positive for vomiting, abdominal pain and diarrhea.      Allergies  Shellfish allergy  Home Medications   Prior to Admission medications   Medication Sig Start Date End Date Taking? Authorizing Provider  promethazine (PHENERGAN) 25 MG tablet Take 1 tablet (25 mg total) by mouth every 6 (six) hours as needed for nausea. 03/25/15   Benjiman Core, MD   BP 131/73 mmHg  Pulse 86  Temp(Src) 97.5 F (36.4 C) (Oral)  Resp 12  SpO2  96% Physical Exam  Constitutional: He appears well-developed.  Patient is rolling around in the bed but stops when I talk to him. Patient is slender  HENT:  Head: Atraumatic.  Eyes: Pupils are equal, round, and reactive to light.  Neck: Neck supple.  Cardiovascular: Normal rate.   Pulmonary/Chest: Breath sounds normal.  Abdominal: Soft. He exhibits no mass. There is no rebound and no guarding.  Musculoskeletal: Normal range of motion.  Neurological: He is alert.  Skin: Skin is warm.    ED Course  Procedures (including critical care time) Labs Review Labs Reviewed  CBC WITH DIFFERENTIAL/PLATELET - Abnormal; Notable for the following:    Neutrophils Relative % 82 (*)    Lymphocytes Relative 9 (*)    All other components within normal limits  COMPREHENSIVE METABOLIC PANEL - Abnormal; Notable for the following:    Creatinine, Ser 1.29 (*)    ALT 15 (*)    All other components within normal limits  URINALYSIS, ROUTINE W REFLEX MICROSCOPIC (NOT AT [facility]) - Abnormal; Notable for the following:    Leukocytes, UA TRACE (*)    All other components within normal limits  URINE RAPID DRUG SCREEN, HOSP PERFORMED - Abnormal; Notable for the following:    Benzodiazepines POSITIVE (*)    Tetrahydrocannabinol POSITIVE (*)    All other components within normal limits  LIPASE, BLOOD  ETHANOL  URINE MICROSCOPIC-ADD ON    Imaging Review No results found.   EKG Interpretation None      MDM  Final diagnoses:  Pain of upper abdomen  Non-intractable vomiting with nausea, vomiting of unspecified type    Patient with nausea vomiting and has had some diarrhea. Also has some underlying psychiatric disorder. Somewhat poor historian. He does not appear to meet involuntary commitment criteria at this time. Seen by social work and given resources. Will discharge home. Lab work overall reassuring.    Benjiman Core, MD 03/25/15 [phone]

## 2015-03-25 NOTE — Progress Notes (Signed)
Discussed case with MD regarding mother's concerns and needs for mental health resources. Met with patient at the bedside. Patient alert and oriented, bizarre behaviors along with inconsistent affect AEB laughing inappropriately and fighting with mom with CSW in room, not answering questions as directed, but with random answers.  Patient has good eye contact and appropriate with CSW Patient reports he was not brought up like everyone stating he did not have a car, have nice things, and remained tangential with thinking and comments.  Reports he is not depressed or SI.  Mother reports they just moved down from [place], plan to stay in [place] with extended family.  Patient has no job or insurance, but had Medicaid in [place].  LCSW explained process regarding applying for Medicaid here.    Unclear with assessment if patient has developmental delays.  Mom left room after fight and unknown where she went and patient reports he does not care.  List of resources and community numbers left as patient is new to the area. Information given about [facility] and walk in times along with crisis numbers.  No other needs known at this time. MD made aware consult complete.  Lane Hacker, MSW Clinical Social Work: Emergency Room [phone]

## 2015-05-23 ENCOUNTER — Emergency Department (HOSPITAL_COMMUNITY): Payer: Self-pay

## 2015-05-23 ENCOUNTER — Emergency Department (HOSPITAL_COMMUNITY)
Admission: EM | Admit: 2015-05-23 | Discharge: 2015-05-23 | Disposition: A | Discharge status: Home / Self Care | Payer: Self-pay | Attending: Emergency Medicine | Admitting: Emergency Medicine

## 2015-05-23 ENCOUNTER — Encounter (HOSPITAL_COMMUNITY): Payer: Self-pay | Admitting: Emergency Medicine

## 2015-05-23 DIAGNOSIS — M545 Low back pain: Secondary | ICD-10-CM | POA: Insufficient documentation

## 2015-05-23 DIAGNOSIS — Z720 Tobacco use: Secondary | ICD-10-CM | POA: Insufficient documentation

## 2015-05-23 DIAGNOSIS — Z036 Encounter for observation for suspected toxic effect from ingested substance ruled out: Secondary | ICD-10-CM | POA: Insufficient documentation

## 2015-05-23 DIAGNOSIS — Z7729 Contact with and (suspected ) exposure to other hazardous substances: Secondary | ICD-10-CM

## 2015-05-23 DIAGNOSIS — G8929 Other chronic pain: Secondary | ICD-10-CM | POA: Insufficient documentation

## 2015-05-23 DIAGNOSIS — J40 Bronchitis, not specified as acute or chronic: Secondary | ICD-10-CM | POA: Insufficient documentation

## 2015-05-23 MED ORDER — ALBUTEROL (5 MG/ML) CONTINUOUS INHALATION SOLN: 2.0000 mg/h | INHALATION_SOLUTION | RESPIRATORY_TRACT | Status: DC

## 2015-05-23 MED ORDER — PREDNISONE 50 MG PO TABS: 50.0000 mg | ORAL_TABLET | Freq: Every day | ORAL | Status: DC

## 2015-05-23 MED ORDER — PREDNISONE 20 MG PO TABS
60.0000 mg | ORAL_TABLET | Freq: Once | ORAL | Status: AC
  Administered 2015-05-23: 60 mg via ORAL
  Filled 2015-05-23: qty 3

## 2015-05-23 MED ORDER — ALBUTEROL (5 MG/ML) CONTINUOUS INHALATION SOLN
10.0000 mg/h | INHALATION_SOLUTION | Freq: Once | RESPIRATORY_TRACT | Status: AC
  Administered 2015-05-23: 10 mg/h via RESPIRATORY_TRACT
  Filled 2015-05-23: qty 20

## 2015-05-23 MED ORDER — IPRATROPIUM BROMIDE 0.02 % IN SOLN
0.5000 mg | Freq: Once | RESPIRATORY_TRACT | Status: AC
  Administered 2015-05-23: 0.5 mg via RESPIRATORY_TRACT
  Filled 2015-05-23: qty 2.5

## 2015-05-23 MED ORDER — ALBUTEROL SULFATE HFA 108 (90 BASE) MCG/ACT IN AERS
1.0000 | INHALATION_SPRAY | Freq: Once | RESPIRATORY_TRACT | Status: AC
  Administered 2015-05-23: 1 via RESPIRATORY_TRACT
  Filled 2015-05-23: qty 6.7

## 2015-05-23 NOTE — ED Notes (Signed)
MD at bedside. 

## 2015-05-23 NOTE — ED Notes (Signed)
Pt arrived to the ED via EMS with a complaint of "not feeling right."  Pt states his whole body is numb.  Pt states he took "moly" during the afternoon.  Pt is jittery and with rapid speech.  Pt states he just wants to feel right.

## 2015-05-23 NOTE — Discharge Instructions (Signed)
Please hydrate well. We anticipate that you will have few more hours of feeing unwell from the chemical exposures - and then it should clear up from your system and you should feel better. Please take the medicine prescribed for the bronchitis.  Please return to the ER if your symptoms worsen; you have increased pain, fevers, chills, inability to keep any medications down, confusion. Otherwise see the outpatient doctor as requested.  Acute Bronchitis Bronchitis is inflammation of the airways that extend from the windpipe into the lungs (bronchi). The inflammation often causes mucus to develop. This leads to a cough, which is the most common symptom of bronchitis.  In acute bronchitis, the condition usually develops suddenly and goes away over time, usually in a couple weeks. Smoking, allergies, and asthma can make bronchitis worse. Repeated episodes of bronchitis may cause further lung problems.  CAUSES Acute bronchitis is most often caused by the same virus that causes a cold. The virus can spread from person to person (contagious) through coughing, sneezing, and touching contaminated objects. SIGNS AND SYMPTOMS   Cough.   Fever.   Coughing up mucus.   Body aches.   Chest congestion.   Chills.   Shortness of breath.   Sore throat.  DIAGNOSIS  Acute bronchitis is usually diagnosed through a physical exam. Your health care provider will also ask you questions about your medical history. Tests, such as chest X-rays, are sometimes done to rule out other conditions.  TREATMENT  Acute bronchitis usually goes away in a couple weeks. Oftentimes, no medical treatment is necessary. Medicines are sometimes given for relief of fever or cough. Antibiotic medicines are usually not needed but may be prescribed in certain situations. In some cases, an inhaler may be recommended to help reduce shortness of breath and control the cough. A cool mist vaporizer may also be used to help thin  bronchial secretions and make it easier to clear the chest.  HOME CARE INSTRUCTIONS  Get plenty of rest.   Drink enough fluids to keep your urine clear or pale yellow (unless you have a medical condition that requires fluid restriction). Increasing fluids may help thin your respiratory secretions (sputum) and reduce chest congestion, and it will prevent dehydration.   Take medicines only as directed by your health care provider.  If you were prescribed an antibiotic medicine, finish it all even if you start to feel better.  Avoid smoking and secondhand smoke. Exposure to cigarette smoke or irritating chemicals will make bronchitis worse. If you are a smoker, consider using nicotine gum or skin patches to help control withdrawal symptoms. Quitting smoking will help your lungs heal faster.   Reduce the chances of another bout of acute bronchitis by washing your hands frequently, avoiding people with cold symptoms, and trying not to touch your hands to your mouth, nose, or eyes.   Keep all follow-up visits as directed by your health care provider.  SEEK MEDICAL CARE IF: Your symptoms do not improve after 1 week of treatment.  SEEK IMMEDIATE MEDICAL CARE IF:  You develop an increased fever or chills.   You have chest pain.   You have severe shortness of breath.  You have bloody sputum.   You develop dehydration.  You faint or repeatedly feel like you are going to pass out.  You develop repeated vomiting.  You develop a severe headache. MAKE SURE YOU:   Understand these instructions.  Will watch your condition.  Will get help right away if you are not  doing well or get worse. Document Released: 11/02/2004 Document Revised: 02/09/2014 Document Reviewed: 03/18/2013 [facility] Patient Information 2015 [place], [place]. This information is not intended to replace advice given to you by your health care provider. Make sure you discuss any questions you have with your health care  provider.

## 2015-05-23 NOTE — ED Provider Notes (Signed)
CSN: [id number]   Arrival date & time 05/23/15 0055  History  This chart was scribed for  Derwood Kaplan, MD by Bethel Born, ED Scribe. This patient was seen in room WA23/WA23 and the patient's care was started at 3:10 AM.  Chief Complaint  Patient presents with  . Fatigue    HPI The history is provided by the patient. No language interpreter was used.   Brought in by EMS, [name] is a 24 y.o. male who presents to the Emergency Department complaining of fatigue with gradual onset yesterday afternoon after doing few "Molly". He feels like he has no energy. Associated symptoms include sweating and diffuse numbness/tingling that has improved. He had no symptoms yesterday. Pt denies fever, chills, headache, sore throat, congestion, chest pain, runny nose, SOB, cough, abdominal pain, nausea, vomiting, diarrhea, dysuria, hematuria, and rash.  No pertinent medical history. No daily medication. No known sick contact.   History reviewed. No pertinent past medical history.  Past Surgical History  Procedure Laterality Date  . Back surgery      History reviewed. No pertinent family history.  Social History  Substance Use Topics  . Smoking status: Current Some Day Smoker  . Smokeless tobacco: None  . Alcohol Use: Yes     Comment: occ     Review of Systems  Constitutional: Positive for diaphoresis. Negative for fever and chills.  HENT: Negative for congestion and sore throat.   Respiratory: Negative for cough and shortness of breath.   Cardiovascular: Negative for chest pain.  Gastrointestinal: Negative for nausea, vomiting, abdominal pain and diarrhea.  Genitourinary: Negative for dysuria and hematuria.  Musculoskeletal: Positive for back pain (chronic).  Skin: Negative for rash.  Neurological: Positive for numbness. Negative for headaches.     Home Medications   Prior to Admission medications   Medication Sig Start Date End Date Taking? Authorizing Provider  predniSONE  (DELTASONE) 50 MG tablet Take 1 tablet (50 mg total) by mouth daily. 05/23/15   Derwood Kaplan, MD  promethazine (PHENERGAN) 25 MG tablet Take 1 tablet (25 mg total) by mouth every 6 (six) hours as needed for nausea. Patient not taking: Reported on 05/23/2015 03/25/15   Benjiman Core, MD    Allergies  Shellfish allergy  Triage Vitals: BP 132/69 mmHg  Pulse 64  Temp(Src) 98.2 F (36.8 C) (Oral)  Resp 19  Ht  (1.651 m)  Wt 160 lb (72.576 kg)  BMI 26.63 kg/m2  SpO2 100%  Physical Exam  Constitutional: He is oriented to person, place, and time. He appears well-developed and well-nourished.  HENT:  Head: Normocephalic.  Eyes: EOM are normal.  Pupils are 3 mm and equal No nystagmus  Neck: Normal range of motion.  No nuchal rigidity  Cardiovascular: Normal rate and regular rhythm.   Pulmonary/Chest: Effort normal. He has wheezes (diffuse). He has no rales.  Abdominal: Soft. He exhibits no distension.  Musculoskeletal: Normal range of motion.  Neurological: He is alert and oriented to person, place, and time.  Skin:  Clammy  Psychiatric: He has a normal mood and affect.  Nursing note and vitals reviewed.   ED Course  Procedures   DIAGNOSTIC STUDIES: Oxygen Saturation is 100% on RA, normal by my interpretation.    COORDINATION OF CARE: 3:16 AM Discussed treatment plan which includes CXR and breathing treatment with pt at bedside and pt agreed to plan.  Labs Review- Labs Reviewed - No data to display  Imaging Review Dg Chest 2 View  05/23/2015  CLINICAL DATA:  Acute onset of fatigue, diaphoresis, diffuse numbness and tingling. Initial encounter.  EXAM: CHEST  2 VIEW  COMPARISON:  None.  FINDINGS: The lungs are well-aerated and clear. There is no evidence of focal opacification, pleural effusion or pneumothorax. Bilateral nipple shadows are seen.  The heart is normal in size; the mediastinal contour is within normal limits. No acute osseous abnormalities are seen.   IMPRESSION: No acute cardiopulmonary process seen.   Electronically Signed   By: Roanna Raider M.D.   On: 05/23/2015 04:23    EKG Interpretation None    7:05 AM Pt reassessed x 2 times, once at 6 and once again now. At 6 he was getting his breathing tx - wheezing had improved, and he no longer is wheezing. He has been monitored in the ER for several hours from the "few molly" ingestion - and his vitals have stayed stable and had no ams, tachydysrhythmias on the tele monitoring.  Pt is stable for d/c. Return precautions discussed.  MDM   Final diagnoses:  Bronchitis  Exposure to toxic substance   Pt comes in with cc of feeling unwell.  He has diffuse wheezing. Pt denies nausea, emesis, fevers, chills, chest pains, shortness of breath, headaches, abdominal pain, uti like symptoms, neck stiffness, rashes. + wheezing.  He has numbness all over, and feels malaise and clammy. No chest pains, palpitations. He did use "few" molly's - and i think his systemic sx are from toxsyndrome and not from infectious process. We will monitor him in the ER under tele and start him on albuterol.       Derwood Kaplan, MD 05/23/15 [phone]

## 2015-05-23 NOTE — ED Notes (Signed)
Bed: WA23 Expected date:  Expected time:  Means of arrival:  Comments: EMS 

## 2015-05-25 ENCOUNTER — Encounter (HOSPITAL_COMMUNITY): Payer: Self-pay | Admitting: *Deleted

## 2015-05-25 ENCOUNTER — Emergency Department (HOSPITAL_COMMUNITY)
Admission: EM | Admit: 2015-05-25 | Discharge: 2015-05-26 | Disposition: A | Discharge status: Home / Self Care | Payer: Self-pay | Attending: Emergency Medicine | Admitting: Emergency Medicine

## 2015-05-25 DIAGNOSIS — Z23 Encounter for immunization: Secondary | ICD-10-CM | POA: Insufficient documentation

## 2015-05-25 DIAGNOSIS — S60512A Abrasion of left hand, initial encounter: Secondary | ICD-10-CM | POA: Insufficient documentation

## 2015-05-25 DIAGNOSIS — Y9389 Activity, other specified: Secondary | ICD-10-CM | POA: Insufficient documentation

## 2015-05-25 DIAGNOSIS — S60511A Abrasion of right hand, initial encounter: Secondary | ICD-10-CM | POA: Insufficient documentation

## 2015-05-25 DIAGNOSIS — Y92410 Unspecified street and highway as the place of occurrence of the external cause: Secondary | ICD-10-CM | POA: Insufficient documentation

## 2015-05-25 DIAGNOSIS — Z720 Tobacco use: Secondary | ICD-10-CM | POA: Insufficient documentation

## 2015-05-25 DIAGNOSIS — Y998 Other external cause status: Secondary | ICD-10-CM | POA: Insufficient documentation

## 2015-05-25 DIAGNOSIS — T07XXXA Unspecified multiple injuries, initial encounter: Secondary | ICD-10-CM

## 2015-05-25 DIAGNOSIS — Z7952 Long term (current) use of systemic steroids: Secondary | ICD-10-CM | POA: Insufficient documentation

## 2015-05-25 MED ORDER — TETANUS-DIPHTH-ACELL PERTUSSIS 5-2.5-18.5 LF-MCG/0.5 IM SUSP
0.5000 mL | Freq: Once | INTRAMUSCULAR | Status: AC
  Administered 2015-05-26: 0.5 mL via INTRAMUSCULAR
  Filled 2015-05-25: qty 0.5
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
## 2015-05-26 MED ORDER — AMOXICILLIN-POT CLAVULANATE 875-125 MG PO TABS: 1.0000 | ORAL_TABLET | Freq: Two times a day (BID) | ORAL | Status: DC

## 2015-05-26 NOTE — Discharge Instructions (Signed)
Keep wound clean with mild soap and water. Keep area covered with a topical antibiotic ointment and bandage, keep bandage dry. Ice and elevate for additional pain relief and swelling. Alternate between ibuprofen and Tylenol for additional pain relief. Take antibiotic as directed. Follow up with your primary care doctor or the [facility] in approximately 3 days for wound recheck. Monitor area for signs of infection to include, but not limited to: increasing pain, redness, drainage/pus, or swelling. Return to emergency department for emergent changing or worsening symptoms.    Abrasions An abrasion is a cut or scrape of the skin. Abrasions do not go through all layers of the skin. HOME CARE  If a bandage (dressing) was put on your wound, change it as told by your doctor. If the bandage sticks, soak it off with warm.  Wash the area with water and soap 2 times a day. Rinse off the soap. Pat the area dry with a clean towel.  Put on medicated cream (ointment) as told by your doctor.  Change your bandage right away if it gets wet or dirty.  Only take medicine as told by your doctor.  See your doctor within 24-48 hours to get your wound checked.  Check your wound for redness, puffiness (swelling), or yellowish-white fluid (pus). GET HELP RIGHT AWAY IF:   You have more pain in the wound.  You have redness, swelling, or tenderness around the wound.  You have pus coming from the wound.  You have a fever or lasting symptoms for more than 2-3 days.  You have a fever and your symptoms suddenly get worse.  You have a bad smell coming from the wound or bandage. MAKE SURE YOU:   Understand these instructions.  Will watch your condition.  Will get help right away if you are not doing well or get worse. Document Released: 03/13/2008 Document Revised: 06/19/2012 Document Reviewed: 08/29/2011 [facility] Patient Information 2015 [place], [place]. This information is not intended to  replace advice given to you by your health care provider. Make sure you discuss any questions you have with your health care provider.

## 2015-07-10 ENCOUNTER — Encounter (HOSPITAL_COMMUNITY): Payer: Self-pay | Admitting: Emergency Medicine

## 2015-07-10 ENCOUNTER — Emergency Department (HOSPITAL_COMMUNITY)
Admission: EM | Admit: 2015-07-10 | Discharge: 2015-07-11 | Disposition: A | Discharge status: Home / Self Care | Payer: Self-pay | Attending: Emergency Medicine | Admitting: Emergency Medicine

## 2015-07-10 DIAGNOSIS — Z720 Tobacco use: Secondary | ICD-10-CM | POA: Insufficient documentation

## 2015-07-10 DIAGNOSIS — K2920 Alcoholic gastritis without bleeding: Secondary | ICD-10-CM | POA: Insufficient documentation

## 2015-07-10 DIAGNOSIS — F10120 Alcohol abuse with intoxication, uncomplicated: Secondary | ICD-10-CM | POA: Insufficient documentation

## 2015-07-10 DIAGNOSIS — F1210 Cannabis abuse, uncomplicated: Secondary | ICD-10-CM | POA: Insufficient documentation

## 2015-07-10 DIAGNOSIS — F1310 Sedative, hypnotic or anxiolytic abuse, uncomplicated: Secondary | ICD-10-CM | POA: Insufficient documentation
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
## 2015-07-10 NOTE — ED Notes (Addendum)
Pt complains of nausea and vomiting. Pt says he has had a few shots of liquor and smoked marijuana tonight and feels he just needs to sleep. Pt is drowsy in triage.

## 2015-07-11 LAB — COMPREHENSIVE METABOLIC PANEL
ALT: 13 U/L — ABNORMAL LOW (ref 17–63)
AST: 23 U/L (ref 15–41)
Albumin: 4.3 g/dL (ref 3.5–5.0)
Alkaline Phosphatase: 39 U/L (ref 38–126)
Anion gap: 10 (ref 5–15)
BUN: 10 mg/dL (ref 6–20)
CO2: 28 mmol/L (ref 22–32)
Calcium: 9.3 mg/dL (ref 8.9–10.3)
Chloride: 100 mmol/L — ABNORMAL LOW (ref 101–111)
Creatinine, Ser: 1.11 mg/dL (ref 0.61–1.24)
GFR calc Af Amer: >60 mL/min (ref >60)
GFR calc non Af Amer: >60 mL/min (ref >60)
Glucose, Bld: 134 mg/dL — ABNORMAL HIGH (ref 65–99)
Potassium: 3.9 mmol/L (ref 3.5–5.1)
Sodium: 138 mmol/L (ref 135–145)
Total Bilirubin: 0.8 mg/dL (ref 0.3–1.2)
Total Protein: 6.4 g/dL — ABNORMAL LOW (ref 6.5–8.1)

## 2015-07-11 LAB — RAPID URINE DRUG SCREEN, HOSP PERFORMED
Amphetamines: NOT DETECTED
Barbiturates: NOT DETECTED
Benzodiazepines: POSITIVE — AB
Cocaine: NOT DETECTED
Opiates: NOT DETECTED
Tetrahydrocannabinol: POSITIVE — AB

## 2015-07-11 LAB — CBC
HCT: 40.5 % (ref 39.0–52.0)
Hemoglobin: 14.3 g/dL (ref 13.0–17.0)
MCH: 31.1 pg (ref 26.0–34.0)
MCHC: 35.3 g/dL (ref 30.0–36.0)
MCV: 88 fL (ref 78.0–100.0)
Platelets: 226 10*3/uL (ref 150–400)
RBC: 4.6 MIL/uL (ref 4.22–5.81)
RDW: 13.2 % (ref 11.5–15.5)
WBC: 5.7 10*3/uL (ref 4.0–10.5)

## 2015-07-11 LAB — ETHANOL: Alcohol, Ethyl (B): <5 mg/dL (ref <5)

## 2015-07-11 MED ORDER — ONDANSETRON 8 MG PO TBDP
8.0000 mg | ORAL_TABLET | Freq: Once | ORAL | Status: AC
  Administered 2015-07-11: 8 mg via ORAL
  Filled 2015-07-11: qty 1

## 2015-07-11 NOTE — Discharge Instructions (Signed)

## 2015-07-11 NOTE — ED Provider Notes (Signed)
CSN: [id number]     Arrival date & time 07/10/15  2308 History  By signing my name below, I, Bethel Born, attest that this documentation has been prepared under the direction and in the presence of Lorre Nick, MD. Electronically Signed: Bethel Born, ED Scribe. 07/11/2015. 1:16 AM    Chief Complaint  Patient presents with  . Nausea  . Alcohol Intoxication    The history is provided by the patient. No language interpreter was used.   [name] is a 24 y.o. male who presents to the Emergency Department complaining of nausea and 1 episode of emesis tonight after drinking 1/2 cup [employer] (Vodka) on an empty stomach. He states that at present he is "just sleepy".  Pt denies hematemesis and current abdominal pain. He also used marijuana tonight.  History reviewed. No pertinent past medical history. Past Surgical History  Procedure Laterality Date  . Back surgery     History reviewed. No pertinent family history. Social History  Substance Use Topics  . Smoking status: Current Some Day Smoker  . Smokeless tobacco: None  . Alcohol Use: Yes     Comment: occ    Review of Systems  Gastrointestinal: Positive for nausea and vomiting. Negative for abdominal pain.  All other systems reviewed and are negative.  Allergies  Shellfish allergy  Home Medications   Prior to Admission medications   Medication Sig Start Date End Date Taking? Authorizing Provider  amoxicillin-clavulanate (AUGMENTIN) 875-125 MG per tablet Take 1 tablet by mouth 2 (two) times daily. One po bid x 7 days Patient not taking: Reported on 07/10/2015 05/26/15   Mercedes Camprubi-Soms, PA-C  predniSONE (DELTASONE) 50 MG tablet Take 1 tablet (50 mg total) by mouth daily. Patient not taking: Reported on 07/10/2015 05/23/15   Derwood Kaplan, MD  promethazine (PHENERGAN) 25 MG tablet Take 1 tablet (25 mg total) by mouth every 6 (six) hours as needed for nausea. Patient not taking: Reported on 05/23/2015 03/25/15    Benjiman Core, MD   BP 141/76 mmHg  Pulse 58  Temp(Src) 97.4 F (36.3 C) (Oral)  Resp 16  SpO2 100% Physical Exam  Constitutional: He is oriented to person, place, and time. He appears well-developed and well-nourished.  Non-toxic appearance. No distress.  HENT:  Head: Normocephalic and atraumatic.  Eyes: Conjunctivae, EOM and lids are normal. Pupils are equal, round, and reactive to light.  Neck: Normal range of motion. Neck supple. No tracheal deviation present. No thyroid mass present.  Cardiovascular: Normal rate, regular rhythm and normal heart sounds.  Exam reveals no gallop.   No murmur heard. Pulmonary/Chest: Effort normal and breath sounds normal. No stridor. No respiratory distress. He has no decreased breath sounds. He has no wheezes. He has no rhonchi. He has no rales.  Abdominal: Soft. Normal appearance and bowel sounds are normal. He exhibits no distension. There is tenderness (slight) in the epigastric area. There is no rebound and no CVA tenderness.  Musculoskeletal: Normal range of motion. He exhibits no edema or tenderness.  Neurological: He is alert and oriented to person, place, and time. He has normal strength. No cranial nerve deficit or sensory deficit. GCS eye subscore is 4. GCS verbal subscore is 5. GCS motor subscore is 6.  Skin: Skin is warm and dry. No abrasion and no rash noted.  Psychiatric: He has a normal mood and affect. His speech is normal and behavior is normal.  Nursing note and vitals reviewed.   ED Course  Procedures (including critical care  time)  DIAGNOSTIC STUDIES: Oxygen Saturation is 100% on RA,  normal by my interpretation.    COORDINATION OF CARE: 1:08 AM Discussed treatment plan which includes lab work and an antiemetic with pt at bedside and pt agreed to plan.  Labs Review Labs Reviewed  CBC  COMPREHENSIVE METABOLIC PANEL  ETHANOL  URINE RAPID DRUG SCREEN, HOSP PERFORMED    Imaging Review No results found. I have  personally reviewed and evaluated these lab results as part of my medical decision-making.   EKG Interpretation None      MDM   Final diagnoses:  None    I personally performed the services described in this documentation, which was scribed in my presence. The recorded information has been reviewed and is accurate.  Patient with alcoholic gastritis. Given Zofran here. Stable for discharge  Lorre Nick, MD 07/11/15 [phone]

## 2015-07-24 ENCOUNTER — Encounter (HOSPITAL_COMMUNITY): Payer: Self-pay | Admitting: Emergency Medicine

## 2015-07-24 ENCOUNTER — Emergency Department (HOSPITAL_COMMUNITY)
Admission: EM | Admit: 2015-07-24 | Discharge: 2015-07-24 | Disposition: A | Discharge status: Home / Self Care | Payer: Self-pay | Attending: Emergency Medicine | Admitting: Emergency Medicine

## 2015-07-24 DIAGNOSIS — Z720 Tobacco use: Secondary | ICD-10-CM | POA: Insufficient documentation

## 2015-07-24 DIAGNOSIS — Y998 Other external cause status: Secondary | ICD-10-CM | POA: Insufficient documentation

## 2015-07-24 DIAGNOSIS — S8991XA Unspecified injury of right lower leg, initial encounter: Secondary | ICD-10-CM | POA: Insufficient documentation

## 2015-07-24 DIAGNOSIS — Y9389 Activity, other specified: Secondary | ICD-10-CM | POA: Insufficient documentation

## 2015-07-24 DIAGNOSIS — Z8709 Personal history of other diseases of the respiratory system: Secondary | ICD-10-CM | POA: Insufficient documentation

## 2015-07-24 DIAGNOSIS — W010XXA Fall on same level from slipping, tripping and stumbling without subsequent striking against object, initial encounter: Secondary | ICD-10-CM | POA: Insufficient documentation

## 2015-07-24 DIAGNOSIS — R0602 Shortness of breath: Secondary | ICD-10-CM | POA: Insufficient documentation

## 2015-07-24 DIAGNOSIS — R5383 Other fatigue: Secondary | ICD-10-CM | POA: Insufficient documentation

## 2015-07-24 DIAGNOSIS — Y9289 Other specified places as the place of occurrence of the external cause: Secondary | ICD-10-CM | POA: Insufficient documentation

## 2015-07-24 DIAGNOSIS — Z8659 Personal history of other mental and behavioral disorders: Secondary | ICD-10-CM | POA: Insufficient documentation

## 2015-07-24 DIAGNOSIS — R062 Wheezing: Secondary | ICD-10-CM | POA: Insufficient documentation

## 2015-07-24 DIAGNOSIS — M25561 Pain in right knee: Secondary | ICD-10-CM

## 2015-07-24 HISTORY — DX: Depression, unspecified: F32.A

## 2015-07-24 HISTORY — DX: Major depressive disorder, single episode, unspecified: F32.9

## 2015-07-24 MED ORDER — ALBUTEROL SULFATE HFA 108 (90 BASE) MCG/ACT IN AERS
2.0000 | INHALATION_SPRAY | RESPIRATORY_TRACT | Status: DC | PRN
  Filled 2015-07-24: qty 6.7
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
## 2015-07-24 NOTE — Discharge Instructions (Signed)
Bronchospasm, Adult A bronchospasm is when the tubes that carry air in and out of your lungs (airways) spasm or tighten. During a bronchospasm it is hard to breathe. This is because the airways get smaller. A bronchospasm can be triggered by:  Allergies. These may be to animals, pollen, food, or mold.  Infection. This is a common cause of bronchospasm.  Exercise.  Irritants. These include pollution, cigarette smoke, strong odors, aerosol sprays, and paint fumes.  Weather changes.  Stress.  Being emotional. HOME CARE   Always have a plan for getting help. Know when to call your doctor and local emergency services (911 in the [place] Know where you can get emergency care.  Only take medicines as told by your doctor.  If you were prescribed an inhaler or nebulizer machine, ask your doctor how to use it correctly. Always use a spacer with your inhaler if you were given one.  Stay calm during an attack. Try to relax and breathe more slowly.  Control your home environment:  Change your heating and air conditioning filter at least once a month.  Limit your use of fireplaces and wood stoves.  Do not  smoke. Do not  allow smoking in your home.  Avoid perfumes and fragrances.  Get rid of pests (such as roaches and mice) and their droppings.  Throw away plants if you see mold on them.  Keep your house clean and dust free.  Replace carpet with wood, tile, or vinyl flooring. Carpet can trap dander and dust.  Use allergy-proof pillows, mattress covers, and box spring covers.  Wash bed sheets and blankets every week in hot water. Dry them in a dryer.  Use blankets that are made of polyester or cotton.  Wash hands frequently. GET HELP IF:  You have muscle aches.  You have chest pain.  The thick spit you spit or cough up (sputum) changes from clear or white to yellow, green, gray, or bloody.  The thick spit you spit or cough up gets thicker.  There are problems that may be  related to the medicine you are given such as:  A rash.  Itching.  Swelling.  Trouble breathing. GET HELP RIGHT AWAY IF:  You feel you cannot breathe or catch your breath.  You cannot stop coughing.  Your treatment is not helping you breathe better.  You have very bad chest pain. MAKE SURE YOU:   Understand these instructions.  Will watch your condition.  Will get help right away if you are not doing well or get worse.   This information is not intended to replace advice given to you by your health care provider. Make sure you discuss any questions you have with your health care provider.   Document Released: 07/23/2009 Document Revised: 10/16/2014 Document Reviewed: 03/18/2013 Elsevier Interactive Patient Education 2016 [employer] Cryotherapy Cryotherapy means treatment with cold. Ice or gel packs can be used to reduce both pain and swelling. Ice is the most helpful within the first 24 to 48 hours after an injury or flare-up from overusing a muscle or joint. Sprains, strains, spasms, burning pain, shooting pain, and aches can all be eased with ice. Ice can also be used when recovering from surgery. Ice is effective, has very few side effects, and is safe for most people to use. PRECAUTIONS  Ice is not a safe treatment option for people with:  Raynaud phenomenon. This is a condition affecting small blood vessels in the extremities. Exposure to cold may cause your  problems to return.  Cold hypersensitivity. There are many forms of cold hypersensitivity, including:  Cold urticaria. Red, itchy hives appear on the skin when the tissues begin to warm after being iced.  Cold erythema. This is a red, itchy rash caused by exposure to cold.  Cold hemoglobinuria. Red blood cells break down when the tissues begin to warm after being iced. The hemoglobin that carry oxygen are passed into the urine because they cannot combine with blood proteins fast enough.  Numbness or altered  sensitivity in the area being iced. If you have any of the following conditions, do not use ice until you have discussed cryotherapy with your caregiver:  Heart conditions, such as arrhythmia, angina, or chronic heart disease.  High blood pressure.  Healing wounds or open skin in the area being iced.  Current infections.  Rheumatoid arthritis.  Poor circulation.  Diabetes. Ice slows the blood flow in the region it is applied. This is beneficial when trying to stop inflamed tissues from spreading irritating chemicals to surrounding tissues. However, if you expose your skin to cold temperatures for too long or without the proper protection, you can damage your skin or nerves. Watch for signs of skin damage due to cold. HOME CARE INSTRUCTIONS Follow these tips to use ice and cold packs safely.  Place a dry or damp towel between the ice and skin. A damp towel will cool the skin more quickly, so you may need to shorten the time that the ice is used.  For a more rapid response, add gentle compression to the ice.  Ice for no more than 10 to 20 minutes at a time. The bonier the area you are icing, the less time it will take to get the benefits of ice.  Check your skin after 5 minutes to make sure there are no signs of a poor response to cold or skin damage.  Rest 20 minutes or more between uses.  Once your skin is numb, you can end your treatment. You can test numbness by very lightly touching your skin. The touch should be so light that you do not see the skin dimple from the pressure of your fingertip. When using ice, most people will feel these normal sensations in this order: cold, burning, aching, and numbness.  Do not use ice on someone who cannot communicate their responses to pain, such as small children or people with dementia. HOW TO MAKE AN ICE PACK Ice packs are the most common way to use ice therapy. Other methods include ice massage, ice baths, and cryosprays. Muscle creams  that cause a cold, tingly feeling do not offer the same benefits that ice offers and should not be used as a substitute unless recommended by your caregiver. To make an ice pack, do one of the following:  Place crushed ice or a bag of frozen vegetables in a sealable plastic bag. Squeeze out the excess air. Place this bag inside another plastic bag. Slide the bag into a pillowcase or place a damp towel between your skin and the bag.  Mix 3 parts water with 1 part rubbing alcohol. Freeze the mixture in a sealable plastic bag. When you remove the mixture from the freezer, it will be slushy. Squeeze out the excess air. Place this bag inside another plastic bag. Slide the bag into a pillowcase or place a damp towel between your skin and the bag. SEEK MEDICAL CARE IF:  You develop white spots on your skin. This may give the  skin a blotchy (mottled) appearance.  Your skin turns blue or pale.  Your skin becomes waxy or hard.  Your swelling gets worse. MAKE SURE YOU:   Understand these instructions.  Will watch your condition.  Will get help right away if you are not doing well or get worse.   This information is not intended to replace advice given to you by your health care provider. Make sure you discuss any questions you have with your health care provider.   Document Released: 05/22/2011 Document Revised: 10/16/2014 Document Reviewed: 05/22/2011 Elsevier Interactive Patient Education [employer]

## 2015-07-24 NOTE — ED Provider Notes (Signed)
CSN: [id number]     Arrival date & time 07/24/15  0145 History   First MD Initiated Contact with Patient 07/24/15 0158     Chief Complaint  Patient presents with  . Asthma     (Consider location/radiation/quality/duration/timing/severity/associated sxs/prior Treatment) Patient is a 24 y.o. male presenting with asthma. The history is provided by the patient. No language interpreter was used.  Asthma This is a recurrent problem. The current episode started today. Associated symptoms include fatigue. Pertinent negatives include no chills or fever. Associated symptoms comments: Patient called EMS for SOB, wheezing. He reports a history of bronchitis. He also complains of right knee pain after tripping earlier tonight and falling onto the knee. nO other injury. The patient admits to alcohol use earlier tonight which he feels contributed to the fall. He feels tired and requests his "whole body to be checked out". He denies chest pain, abdominal pain, nausea, vomiting, diarrhea. .    Past Medical History  Diagnosis Date  . Depression    Past Surgical History  Procedure Laterality Date  . Back surgery     History reviewed. No pertinent family history. Social History  Substance Use Topics  . Smoking status: Current Some Day Smoker  . Smokeless tobacco: None  . Alcohol Use: Yes     Comment: occ    Review of Systems  Constitutional: Positive for fatigue. Negative for fever and chills.  HENT: Negative.   Respiratory: Positive for shortness of breath and wheezing.   Cardiovascular: Negative.   Gastrointestinal: Negative.   Musculoskeletal:       C/O right knee pain.  Skin: Negative.   Neurological: Negative.       Allergies  Shellfish allergy  Home Medications   Prior to Admission medications   Medication Sig Start Date End Date Taking? Authorizing Provider  amoxicillin-clavulanate (AUGMENTIN) 875-125 MG per tablet Take 1 tablet by mouth 2 (two) times daily. One po bid x 7  days Patient not taking: Reported on 07/10/2015 05/26/15   Mercedes Camprubi-Soms, PA-C  predniSONE (DELTASONE) 50 MG tablet Take 1 tablet (50 mg total) by mouth daily. Patient not taking: Reported on 07/10/2015 05/23/15   Derwood KaplanAnkit Nanavati, MD  promethazine (PHENERGAN) 25 MG tablet Take 1 tablet (25 mg total) by mouth every 6 (six) hours as needed for nausea. Patient not taking: Reported on 05/23/2015 03/25/15   Benjiman CoreNathan Pickering, MD   BP 116/54 mmHg  Pulse 69  Temp(Src) 98 F (36.7 C) (Oral)  Resp 16  Ht 5\' 5"  (1.651 m)  Wt 160 lb (72.576 kg)  BMI 26.63 kg/m2  SpO2 97% Physical Exam  Constitutional: He is oriented to person, place, and time. He appears well-developed and well-nourished.  HENT:  Head: Normocephalic.  Neck: Normal range of motion. Neck supple.  Cardiovascular: Normal rate and regular rhythm.   Pulmonary/Chest: Effort normal and breath sounds normal. He has no wheezes. He has no rales. He exhibits no tenderness.  Abdominal: Soft. Bowel sounds are normal. There is no tenderness. There is no rebound and no guarding.  Musculoskeletal: Normal range of motion. He exhibits no edema.  Right knee appears atraumatic. No bony deformity. No significant tenderness. No joint laxity.   Neurological: He is alert and oriented to person, place, and time.  Skin: Skin is warm and dry. No rash noted.  Psychiatric: He has a normal mood and affect.    ED Course  Procedures (including critical care time) Labs Review Labs Reviewed - No data to display  Imaging  Review No results found. I have personally reviewed and evaluated these images and lab results as part of my medical decision-making.   EKG Interpretation None      MDM   Final diagnoses:  None    1. Right knee pain 2. Fatigue 3. Wheezing  Wheezing resolved with nebulizer x 1 given en route. No hypoxia. Knee is not fractured and suspect mild contusion only. VSS. Patient is stable for discharge home.     Elpidio Anis,  PA-C 07/24/15 1610  Rolland Porter, MD 08/06/15 [phone]

## 2015-07-24 NOTE — ED Notes (Signed)
Bed: WA09 Expected date:  Expected time:  Means of arrival:  Comments: EMS Asthma/fall

## 2015-08-06 ENCOUNTER — Encounter (HOSPITAL_COMMUNITY): Payer: Self-pay | Admitting: Emergency Medicine

## 2015-08-06 ENCOUNTER — Emergency Department (HOSPITAL_COMMUNITY)
Admission: EM | Admit: 2015-08-06 | Discharge: 2015-08-06 | Disposition: A | Discharge status: Home / Self Care | Payer: Self-pay | Attending: Emergency Medicine | Admitting: Emergency Medicine

## 2015-08-06 DIAGNOSIS — R5383 Other fatigue: Secondary | ICD-10-CM | POA: Insufficient documentation

## 2015-08-06 DIAGNOSIS — Z8659 Personal history of other mental and behavioral disorders: Secondary | ICD-10-CM | POA: Insufficient documentation

## 2015-08-06 DIAGNOSIS — Z720 Tobacco use: Secondary | ICD-10-CM | POA: Insufficient documentation

## 2015-08-06 NOTE — ED Notes (Signed)
Patient is alert and orientedx4.  Patient was explained discharge instructions and they understood them with no questions.   

## 2015-08-06 NOTE — ED Provider Notes (Signed)
History  By signing my name below, I, Karle PlumberJennifer Tensley, attest that this documentation has been prepared under the direction and in the presence of Geoffery Lyonsouglas Minie Roadcap, MD. Electronically Signed: Karle PlumberJennifer Tensley, ED Scribe. 08/06/2015. 2:51 AM.  Chief Complaint  Patient presents with  . Leg Pain   Patient is a 24 y.o. male presenting with leg pain. The history is provided by the patient and medical records. No language interpreter was used.  Leg Pain Location:  Leg Time since incident:  1 week Leg location:  R leg and L leg Pain details:    Quality:  Unable to specify   Radiates to:  Does not radiate   Severity:  Unable to specify   Onset quality:  Unable to specify   Duration:  1 week   Timing:  Unable to specify   Progression:  Unable to specify Dislocation: no   Foreign body present:  No foreign bodies Prior injury to area:  No Relieved by:  None tried Worsened by:  Nothing tried Ineffective treatments:  None tried   HPI Comments:  [name] is a 24 y.o. male who presents to the Emergency Department complaining of bilateral leg pain that began earlier this week. He states his legs and body are tired. He has not done anything for treatment of his symptoms. He denies modifying factors. He does not have a PCP.   Past Medical History  Diagnosis Date  . Depression    Past Surgical History  Procedure Laterality Date  . Back surgery     No family history on file. Social History  Substance Use Topics  . Smoking status: Current Some Day Smoker  . Smokeless tobacco: None  . Alcohol Use: Yes    Review of Systems  Musculoskeletal: Positive for arthralgias.  All other systems reviewed and are negative.   Allergies  Shellfish allergy  Home Medications   Prior to Admission medications   Medication Sig Start Date End Date Taking? Authorizing Provider  amoxicillin-clavulanate (AUGMENTIN) 875-125 MG per tablet Take 1 tablet by mouth 2 (two) times daily. One po bid x 7  days Patient not taking: Reported on 07/10/2015 05/26/15   Mercedes Camprubi-Soms, PA-C  predniSONE (DELTASONE) 50 MG tablet Take 1 tablet (50 mg total) by mouth daily. Patient not taking: Reported on 07/10/2015 05/23/15   Derwood KaplanAnkit Nanavati, MD  promethazine (PHENERGAN) 25 MG tablet Take 1 tablet (25 mg total) by mouth every 6 (six) hours as needed for nausea. Patient not taking: Reported on 05/23/2015 03/25/15   Benjiman CoreNathan Pickering, MD   Triage Vitals: BP 135/74 mmHg  Pulse 89  Temp(Src) 98 F (36.7 C) (Oral)  Resp 18  SpO2 97% Physical Exam  Constitutional: He is oriented to person, place, and time. He appears well-developed and well-nourished.  HENT:  Head: Normocephalic.  Eyes: EOM are normal.  Neck: Normal range of motion.  Cardiovascular: Normal rate, regular rhythm and normal heart sounds.   No murmur heard. Pulmonary/Chest: Effort normal and breath sounds normal. No respiratory distress.  Abdominal: He exhibits no distension.  Musculoskeletal: Normal range of motion.  Neurological: He is alert and oriented to person, place, and time.  Psychiatric: He has a normal mood and affect.  Nursing note and vitals reviewed.   ED Course  Procedures (including critical care time) DIAGNOSTIC STUDIES: Oxygen Saturation is 97% on RA, normal by my interpretation.   COORDINATION OF CARE: 2:51 AM- Will provide pt a resource guide and advised him to follow up with a PCP. Pt  verbalizes understanding and agrees to plan.  Medications - No data to display  Labs Review Labs Reviewed - No data to display  Imaging Review No results found. I have personally reviewed and evaluated these images and lab results as part of my medical decision-making.   EKG Interpretation None      MDM   Final diagnoses:  None    Patient is a 23 year old male who presents to the emergency department requesting a "checkup". He denies to me that he has any specific complaints in that he just wants his health  evaluated. States he has been walking a lot lately and his legs can occasionally become sore, however has no complaints otherwise. I see no emergent process and do not feel as though any workup is indicated. I advised the patient that checkups are best to be performed at the health department or primary doctor's office. He was given the resource guide with him he can call to arrange an appointment.  I personally performed the services described in this documentation, which was scribed in my presence. The recorded information has been reviewed and is accurate.      Geoffery Lyons, MD 08/06/15 [phone]

## 2015-08-06 NOTE — ED Notes (Signed)
Pt. reports bilateral leg pain onset this week , denies injury , pt. stated " I've been walking a lot ". Denies injury/ ambulatory.

## 2015-08-06 NOTE — Discharge Instructions (Signed)
Fatigue Fatigue is feeling tired all of the time, a lack of energy, or a lack of motivation. Occasional or mild fatigue is often a normal response to activity or life in general. However, long-lasting (chronic) or extreme fatigue may indicate an underlying medical condition. HOME CARE INSTRUCTIONS  Watch your fatigue for any changes. The following actions may help to lessen any discomfort you are feeling:  Talk to your health care provider about how much sleep you need each night. Try to get the required amount every night.  Take medicines only as directed by your health care provider.  Eat a healthy and nutritious diet. Ask your health care provider if you need help changing your diet.  Drink enough fluid to keep your urine clear or pale yellow.  Practice ways of relaxing, such as yoga, meditation, massage therapy, or acupuncture.  Exercise regularly.   Change situations that cause you stress. Try to keep your work and personal routine reasonable.  Do not abuse illegal drugs.  Limit alcohol intake to no more than 1 drink per day for nonpregnant women and 2 drinks per day for men. One drink equals 12 ounces of beer, 5 ounces of wine, or 1 ounces of hard liquor.  Take a multivitamin, if directed by your health care provider. SEEK MEDICAL CARE IF:   Your fatigue does not get better.  You have a fever.   You have unintentional weight loss or gain.  You have headaches.   You have difficulty:   Falling asleep.  Sleeping throughout the night.  You feel angry, guilty, anxious, or sad.   You are unable to have a bowel movement (constipation).   You skin is dry.   Your legs or another part of your body is swollen.  SEEK IMMEDIATE MEDICAL CARE IF:   You feel confused.   Your vision is blurry.  You feel faint or pass out.   You have a severe headache.   You have severe abdominal, pelvic, or back pain.   You have chest pain, shortness of breath, or an  irregular or fast heartbeat.   You are unable to urinate or you urinate less than normal.   You develop abnormal bleeding, such as bleeding from the rectum, vagina, nose, lungs, or nipples.  You vomit blood.   You have thoughts about harming yourself or committing suicide.   You are worried that you might harm someone else.    This information is not intended to replace advice given to you by your health care provider. Make sure you discuss any questions you have with your health care provider.   Document Released: 07/23/2007 Document Revised: 10/16/2014 Document Reviewed: 01/27/2014 Elsevier Interactive Patient Education 2016 [employer].    Emergency Department Resource Guide 1) Find a Doctor and Pay Out of Pocket Although you won't have to find out who is covered by your insurance plan, it is a good idea to ask around and get recommendations. You will then need to call the office and see if the doctor you have chosen will accept you as a new patient and what types of options they offer for patients who are self-pay. Some doctors offer discounts or will set up payment plans for their patients who do not have insurance, but you will need to ask so you aren't surprised when you get to your appointment.  2) Contact Your Local Health Department Not all health departments have doctors that can see patients for sick visits, but many do, so it  is worth a call to see if yours does. If you don't know where your local health department is, you can check in your phone book. The CDC also has a tool to help you locate your state's health department, and many state websites also have listings of all of their local health departments.  3) Find a Walk-in Clinic If your illness is not likely to be very severe or complicated, you may want to try a walk in clinic. These are popping up all over the country in pharmacies, drugstores, and shopping centers. They're usually staffed by nurse  practitioners or physician assistants that have been trained to treat common illnesses and complaints. They're usually fairly quick and inexpensive. However, if you have serious medical issues or chronic medical problems, these are probably not your best option.  No Primary Care Doctor: - Call Health Connect at  [phone] - they can help you locate a primary care doctor that  accepts your insurance, provides certain services, etc. - Physician Referral Service- [phone]  Chronic Pain Problems: Organization         Address  Phone   Notes  [facility]  [phone] Patients need to be referred by their primary care doctor.   Medication Assistance: Organization         Address  Phone   Notes  [facility] Medication [facility] [address] [place], [place] [address] [phone] --Must be a resident of [facility] -- Must have NO insurance coverage whatsoever (no Medicaid/ Medicare, etc.) -- The pt. MUST have a primary care doctor that directs their care regularly and follows them in the community   [facility]  [phone]   [employer]  [phone]    Agencies that provide inexpensive medical care: Organization         Address  Phone   Notes  [facility]  [phone]   [facility]    [phone]   [facility] [address] [place], [place] [address] [phone]   [facility] [address] [place] [phone]   Planned Parenthood    [phone]   [facility]    [phone]   [facility]  [address], [place] Phone:  [phone], Fax:  [phone] Hours of Operation:  9 am - 6 pm, M-F.  Also accepts Medicaid/Medicare and self-pay.  [facility]  [address], [place] Phone: [phone], Fax: [phone]. Hours of Operation:  8:30 am -  5:30 pm, M-F.  Also accepts Medicaid and self-pay.  [facility] [place] [address] [place] Phone: [phone]   [facility] [address] Natasha Bence [place], [place] [phone], Ext. [phone] Mondays & Thursdays: 7-9 AM.  First 15 patients are seen on a first come, first serve basis.    Medicaid-accepting [facility] Providers:  Organization         Address  Phone   Notes  [facility] [address],  [phone] Also accepts self-pay patients.  [facility] [address] Laurell Josephs [place], [place]  [phone]   [facility] [address], [place] [phone]   [facility] [address], [place] [phone]   Renaye Rakers [address]  [address], [place]   [phone] Only accepts [place] Access Medicaid patients after they have their name applied to their card.   Self-Pay (no insurance) in [facility]:  Organization         Address  Phone   Notes  Sickle Cell Patients, [facility] [address], [place] [phone]) [phone]   [facility] [address], [place] [phone]) [phone]   [facility] [place]  [address], [place] [phone]) [phone]   [facility]/Dr. Osei-Bonsu  [address] [place] [address], [place] or [phone], [place] [phone] Phone number for both [place] and [place] locations is the same.  [facility] [address], [place] [phone]   [facility] [place] [address] [place] [address], [place] or [address] [phone]) [phone]   [facility] [address], [place] [phone]) [phone], phone; [phone]) [phone], fax Sees patients 1st and 3rd Saturday of every month.  Must not qualify for public or private insurance (i.e. Medicaid, Medicare, [place] Health Choice,  Veterans' Benefits)  Household income should be no more than 200% of the poverty level The clinic cannot treat you if you are pregnant or think you are pregnant  Sexually transmitted diseases are not treated at the clinic.    Dental Care: Organization         Address  Phone  Notes  [facility] of [facility] Health [facility] [address], [place] [phone]) [phone] Accepts children up to age 23 who are enrolled in [place] or [place] Health Choice; pregnant women with a Medicaid card; and children who have applied for Medicaid or [place] Health Choice, but were declined, whose parents can pay a reduced fee at time of service.  [facility] [place]  [address], [place] [phone]) [phone] Accepts children up to age 23 who are enrolled in [place] or [place] Health Choice; pregnant women with a Medicaid card; and children who have applied for Medicaid or [place] Health Choice, but were declined, whose parents can pay a reduced fee at time of service.  [facility]  [address], [place] [phone] Patients are seen by appointment only. Walk-ins are not accepted. [facility] will see patients 23 years of age and older. Monday - Tuesday (8am-5pm) Most Wednesdays (8:30-5pm) $30 per visit, cash only  [facility]  [address], [facility] [phone] Patients are seen by appointment only. Walk-ins are not accepted. [facility] will see patients 23 years of age and older. One Wednesday Evening (Monthly: Volunteer Based).  $30 per visit, cash only  [facility]  [phone]) [phone] for adults; Children under age 324, call Graduate Pediatric Dentistry at [phone]. Children aged 664-14, please call [phone]) [phone] to request a pediatric application.  Dental services are provided in all areas of dental care including fillings, crowns and bridges, complete and  partial dentures, implants, gum treatment, root canals, and extractions. Preventive care is also provided. Treatment is provided to both adults and children. Patients are selected via a lottery and there is often a waiting list.   [facility] [address], [place]  [phone] [email or web address]   [facility] [address], [place], [place] [phone], Ext. [phone] Second and Fourth Thursday  of each month, opens at 6:30 AM; Clinic ends at 9 AM.  Patients are seen on a first-come first-served basis, and a limited number are seen during each clinic.   [facility]  [address] Ether Griffins [place], [place] [phone]   Eligibility Requirements You must have lived in [place], [place], or [place] counties for at least the last three months.   You cannot be eligible for state or federal sponsored [place], including [employer], [place], or [employer].   You generally cannot be eligible for healthcare insurance through your employer.    How to apply: Eligibility screenings are held every Tuesday and Wednesday afternoon from 1:00 pm until 4:00 pm. You do not need an appointment for the interview!  [facility] [address] [place], [place] [phone]   [facility] Health Department  [phone]   [facility] Health Department  [phone]   [facility] Health Department  [phone]    Behavioral Health Resources in the Community: Intensive Outpatient Programs Organization         Address  Phone  Notes  [facility] Services [address] [place], [place] [phone]   [facility] Outpatient [address], [place], [place] [phone]   [facility] [address], [place], [place]  [phone]   [facility] [address]  [place], [place] [phone] or [phone]   Substance Abuse Resources Organization          Address  Phone  Notes  Alcohol [facility] Drug Services  [phone]   [facility]  [phone]   [facility] [place]  [phone]   Floydene Flock  [phone]   Residential & Outpatient Substance Abuse Program  [phone]   Psychological Services Organization         Address  Phone  Notes  [facility] Health  [phone]   [facility]  [phone]   [facility] Mental Health [address], [place] [phone] or [phone]    Mobile Crisis Teams Organization         Address  Phone  Notes  [facility], Mobile Crisis Care Unit  [phone]   [facility]  [address] [place], [place] [phone]   Doristine Locks [address] [place] [phone]    Self-Help/Support Groups Organization         Address  Phone             Notes  [facility] [place] - variety of support groups  [phone]- I7437963 Call for more information  Narcotics Anonymous (NA), [facility] [address], [employer] [place]  2 meetings at this location   Statistician         Address  Phone  Notes  [facility] [address],    [place]  [phone]   [facility]  [address], [place] [address], [place], [place] [phone]   [facility] [address] [place], [place] [phone] Admissions: 8am-3pm M-F  [facility] [address]    [place], [place] [phone]   [facility] [address] Starling Manns [place], [place] [phone]   [facility] [address]  [place], [place] [phone]   [facility] - Intensive Outpatient [address] Laurell Josephs [address], [place], [place] [phone]   [facility] ([facility] [phone]  [place], [place] [phone] or [phone]   [facility] ([facility]) [address] [place], [place]  [phone] Accepts Medicaid  [facility] [address],  [place] [phone] Substance Abuse/Addiction Treatment   [facility] Organization         Address  Phone  Notes  [facility]  [phone]   Angie Fava, PhD [address] Ervin Knack [place], [place]   [phone] or [phone]   [facility] Behavioral   [address] [place], [place] [phone]   [facility] [address], [place], [place] [phone] Insurance/Medicaid/sponsorship through [facility] Families [address]                                    [place], [place] [phone] Therapy/tele-psych/case  [facility] [address], [place] [phone]    Dr. Lolly Mustache  [phone]   [facility]  [employer] [facility] Dept. 1) [address] [place] 2) [address] [place] 3)  [address], [place] [phone]  [phone]   [facility] Child Abuse Hotline (437)547-7177 or [phone] (After Hours)

## 2016-04-28 ENCOUNTER — Encounter (HOSPITAL_COMMUNITY): Payer: Self-pay | Admitting: *Deleted

## 2016-04-28 ENCOUNTER — Emergency Department (HOSPITAL_COMMUNITY)
Admission: EM | Admit: 2016-04-28 | Discharge: 2016-05-02 | Disposition: A | Discharge status: Home / Self Care | Payer: Self-pay | Attending: Emergency Medicine | Admitting: Emergency Medicine

## 2016-04-28 DIAGNOSIS — Z5181 Encounter for therapeutic drug level monitoring: Secondary | ICD-10-CM | POA: Insufficient documentation

## 2016-04-28 DIAGNOSIS — F6381 Intermittent explosive disorder: Secondary | ICD-10-CM | POA: Insufficient documentation

## 2016-04-28 DIAGNOSIS — R45851 Suicidal ideations: Secondary | ICD-10-CM | POA: Insufficient documentation

## 2016-04-28 DIAGNOSIS — F17200 Nicotine dependence, unspecified, uncomplicated: Secondary | ICD-10-CM | POA: Insufficient documentation

## 2016-04-28 DIAGNOSIS — F329 Major depressive disorder, single episode, unspecified: Secondary | ICD-10-CM | POA: Insufficient documentation

## 2016-04-28 LAB — COMPREHENSIVE METABOLIC PANEL
ALT: 14 U/L — ABNORMAL LOW (ref 17–63)
AST: 21 U/L (ref 15–41)
Albumin: 5 g/dL (ref 3.5–5.0)
Alkaline Phosphatase: 46 U/L (ref 38–126)
Anion gap: 8 (ref 5–15)
BUN: 13 mg/dL (ref 6–20)
CO2: 29 mmol/L (ref 22–32)
Calcium: 9.5 mg/dL (ref 8.9–10.3)
Chloride: 103 mmol/L (ref 101–111)
Creatinine, Ser: 1.04 mg/dL (ref 0.61–1.24)
GFR calc Af Amer: >60 mL/min (ref >60)
GFR calc non Af Amer: >60 mL/min (ref >60)
Glucose, Bld: 96 mg/dL (ref 65–99)
Potassium: 3.8 mmol/L (ref 3.5–5.1)
Sodium: 140 mmol/L (ref 135–145)
Total Bilirubin: 0.6 mg/dL (ref 0.3–1.2)
Total Protein: 7.7 g/dL (ref 6.5–8.1)

## 2016-04-28 LAB — CBC
HCT: 44.4 % (ref 39.0–52.0)
Hemoglobin: 14.9 g/dL (ref 13.0–17.0)
MCH: 30.3 pg (ref 26.0–34.0)
MCHC: 33.6 g/dL (ref 30.0–36.0)
MCV: 90.2 fL (ref 78.0–100.0)
Platelets: 198 10*3/uL (ref 150–400)
RBC: 4.92 MIL/uL (ref 4.22–5.81)
RDW: 12.8 % (ref 11.5–15.5)
WBC: 4.6 10*3/uL (ref 4.0–10.5)

## 2016-04-28 LAB — RAPID URINE DRUG SCREEN, HOSP PERFORMED
Amphetamines: NOT DETECTED
Barbiturates: NOT DETECTED
Benzodiazepines: NOT DETECTED
Cocaine: NOT DETECTED
Opiates: NOT DETECTED
Tetrahydrocannabinol: NOT DETECTED

## 2016-04-28 LAB — ACETAMINOPHEN LEVEL: Acetaminophen (Tylenol), Serum: <10 ug/mL — ABNORMAL LOW (ref 10–30)

## 2016-04-28 LAB — ETHANOL: Alcohol, Ethyl (B): <5 mg/dL (ref <5)

## 2016-04-28 LAB — SALICYLATE LEVEL: Salicylate Lvl: <4 mg/dL (ref 2.8–30.0)

## 2016-04-28 MED ORDER — ACETAMINOPHEN 325 MG PO TABS: 650.0000 mg | ORAL_TABLET | Freq: Four times a day (QID) | ORAL | Status: DC | PRN

## 2016-04-28 MED ORDER — LORAZEPAM 1 MG PO TABS
1.0000 mg | ORAL_TABLET | Freq: Four times a day (QID) | ORAL | Status: DC | PRN
  Administered 2016-04-28 – 2016-04-30 (×3): 1 mg via ORAL
  Filled 2016-04-28 (×4): qty 1

## 2016-04-28 MED ORDER — TRAZODONE HCL 50 MG PO TABS
50.0000 mg | ORAL_TABLET | Freq: Every evening | ORAL | Status: DC | PRN
  Administered 2016-04-28 – 2016-04-30 (×3): 50 mg via ORAL
  Filled 2016-04-28 (×4): qty 1

## 2016-04-28 NOTE — ED Provider Notes (Signed)
CSN: [id number]     Arrival date & time 04/28/16  1405 History   First MD Initiated Contact with Patient 04/28/16 1529     Chief Complaint  Patient presents with  . Medical Clearance  . Suicidal     (Consider location/radiation/quality/duration/timing/severity/associated sxs/prior Treatment) HPI Comments: 25 year old male with history of depression and reported schizophrenia presents as an IVC to individual from [place]. He was taken to [facility] by his and for mental health help. She reports that the patient was reporting suicidal ideations as well as homicidal ideations per the IVC paperwork. During my evaluation the patient will answer basic questions. He mostly nods his head yes or no. When asked if he has thoughts of hurting himself he answered yes. When I asked him if he had thoughts of hurting others including his family members he shook his head no and then also stated of course not. He said he is unsure if he's ever tried to hurt himself in the past but denies recently trying to hurt himself. When I asked if he felt like he needed mental health help he said yes. I asked him if he was seen or hearing things that he knew were not there were not actually being sad and he said no.   Past Medical History  Diagnosis Date  . Depression    Past Surgical History  Procedure Laterality Date  . Back surgery     No family history on file. Social History  Substance Use Topics  . Smoking status: Current Some Day Smoker  . Smokeless tobacco: None  . Alcohol Use: Yes    Review of Systems  Unable to perform ROS: Psychiatric disorder      Allergies  Shellfish allergy  Home Medications   Prior to Admission medications   Not on File   There were no vitals taken for this visit. Physical Exam  Constitutional: He is oriented to person, place, and time. He appears well-developed and well-nourished. No distress.  HENT:  Head: Normocephalic and atraumatic.  Right Ear: External ear  normal.  Left Ear: External ear normal.  Mouth/Throat: Oropharynx is clear and moist. No oropharyngeal exudate.  Eyes: EOM are normal. Pupils are equal, round, and reactive to light.  Neck: Normal range of motion. Neck supple.  Cardiovascular: Normal rate, regular rhythm, normal heart sounds and intact distal pulses.   No murmur heard. Pulmonary/Chest: Effort normal. No respiratory distress. He has no wheezes. He has no rales.  Abdominal: Soft. He exhibits no distension. There is no tenderness.  Musculoskeletal: He exhibits no edema.  Neurological: He is alert and oriented to person, place, and time.  Skin: Skin is warm and dry. No rash noted. He is not diaphoretic.  Psychiatric: His affect is blunt. His speech is delayed. He is slowed and withdrawn. He expresses suicidal ideation. He expresses no homicidal ideation. He expresses no homicidal plans.  Vitals reviewed.   ED Course  Procedures (including critical care time) Labs Review Labs Reviewed  COMPREHENSIVE METABOLIC PANEL - Abnormal; Notable for the following:    ALT 14 (*)    All other components within normal limits  ACETAMINOPHEN LEVEL - Abnormal; Notable for the following:    Acetaminophen (Tylenol), Serum <10 (*)    All other components within normal limits  ETHANOL  SALICYLATE LEVEL  CBC  URINE RAPID DRUG SCREEN, HOSP PERFORMED    Imaging Review No results found. I have personally reviewed and evaluated these images and lab results as part of my medical  decision-making.   EKG Interpretation None      MDM  Patient was seen and evaluated at bedside. Patient did answer questions for me during evaluation although only with basic answers and mainly with head gestures. He does appear to need psychiatric help. He is medically cleared for psychiatric evaluation. TTS consult place. Patient seen by behavioral health specialist who recommended inpatient treatment. I agree with this plan. Final diagnoses:  None    1.   Behavioral change 2. Suicidal ideation    Leta BaptistEmily Roe Camden Knotek, MD 04/28/16 2307

## 2016-04-28 NOTE — ED Notes (Signed)
Pt brought in by [employer] [facility]. List of suicidal ideations, homicidal and psychotic,  [facility]  Took IVC papers out on pt. [facility]

## 2016-04-28 NOTE — ED Notes (Signed)
Patient is guarded and paranoid.  He denied drug use,but then made a smoking gesture to this Clinical research associatewriter.  He denied SI/HI.  States he doesn't work. Fluids given and oriented to unit.

## 2016-04-28 NOTE — BH Assessment (Signed)
Assessment Note  [name] is an 25 y.o. male that presents this date under IVC initiated by Aunt (name not noted in IVC or presenting paperwork). All information for the purposes of this assessment was obtained from collateral notes and contacting [name] RN at [facility] where patient was initially interviewed. [name] RN stated patient was brought in by Aunt who stated patient had made statements of self harm and threats to Aunt earlier this date. [name] could not verify that since patient would not speak or seem to understand the content of questions presented. Patient was unable to sign admission forms or any information presented to patient at the crisis center. Per IVC: "Respondent is very disorganized in his thoughts and would not give his name or DOB. He would not sign any forms and would not speak to any of the providers. Aunt reports that he made threatening statements to her and her children. Respondent also made suicidal statements." Admission Note states: "Pt brought in by [employer] with [facility]. List of suicidal ideations, homicidal and psychotic,[facility]." Upon review of admission paperwork it was noted that patient has been recently released from prison and has been residing with his Aunt. Per notes, patient has a history of Schizophrenia. UDS was negative for all substances. Patient refused to speak to this writer and seemed to be responding to internal stimuli. Case staffed with [name] FNP who recommended inpatient admission per IVC as appropriate placement is investigated.     Diagnosis: Schizophrenia per notes  Past Medical History:  Past Medical History  Diagnosis Date  . Depression     Past Surgical History  Procedure Laterality Date  . Back surgery      Family History: No family history on file.  Social History:  reports that he has been smoking.  He does not have any smokeless tobacco history on file. He reports that he drinks  alcohol. He reports that he uses illicit drugs (Marijuana).  Additional Social History:  Alcohol / Drug Use Pain Medications: See MAR Prescriptions: See MAR Over the Counter: See MAR History of alcohol / drug use?:  (UTA UDS negative on admission)  CIWA: CIWA-Ar BP: 125/78 mmHg Pulse Rate: 84 COWS:    Allergies:  Allergies  Allergen Reactions  . Shellfish Allergy Hives and Itching    Home Medications:  (Not in a hospital admission)  OB/GYN Status:  No LMP for male patient.  General Assessment Data Location of Assessment: [facility] TTS Assessment: In system Is this a Tele or Face-to-Face Assessment?: Face-to-Face Is this an Initial Assessment or a Re-assessment for this encounter?: Initial Assessment Marital status: Single Maiden name: na Is patient pregnant?: No Pregnancy Status: No Living Arrangements: Other relatives Can pt return to current living arrangement?:  (Unknown) Admission Status: Involuntary Is patient capable of signing voluntary admission?: No Referral Source: Other Museum/gallery curator([facility]) Insurance type: Med Pay  Medical Screening Exam [facility] Walk-[facility] ONLY) Medical Exam completed: Yes  Crisis Care Plan Living Arrangements: Other relatives Legal Guardian: Other: (NA) Name of Psychiatrist: None Name of Therapist: None  Education Status Is patient currently in school?: No Current Grade: na Highest grade of school patient has completed:  (UTA) Name of school:  (NA) Contact person: NA  Risk to self with the past 6 months Suicidal Ideation: Yes-Currently Present (per IVC) Has patient been a risk to self within the past 6 months prior to admission? : Yes (per IVC) Suicidal Intent: Yes-Currently Present Has patient had any suicidal  intent within the past 6 months prior to admission? :  (per IVC) Is patient at risk for suicide?: Yes Suicidal Plan?: No (not per IVC) Has patient had any suicidal plan within the past 6 months prior to admission? : No Access to Means: No  (Not per IVC) What has been your use of drugs/alcohol within the last 12 months?:  (UTA UDS was neg) Previous Attempts/Gestures:  (UTA) How many times?:  (UTA) Other Self Harm Risks:  (UTA) Triggers for Past Attempts:  (UTA) Intentional Self Injurious Behavior:  (UTA) Family Suicide History: Unable to assess Recent stressful life event(s):  (UTA) Persecutory voices/beliefs?:  Rich Reining) Depression:  (UTA) Depression Symptoms:  (UTA) Substance abuse history and/or treatment for substance abuse?: No Suicide prevention information given to non-admitted patients: Not applicable  Risk to Others within the past 6 months Homicidal Ideation: Yes-Currently Present (per IVC) Does patient have any lifetime risk of violence toward others beyond the six months prior to admission? : Yes (comment) (yes per IVC) Thoughts of Harm to Others: Yes-Currently Present (per IVC) Comment - Thoughts of Harm to Others: Yes per IVC Current Homicidal Intent: Yes-Currently Present (per IVC) Current Homicidal Plan: No Access to Homicidal Means: No Identified Victim: patient's aunt History of harm to others?:  (UTA) Assessment of Violence: None Noted Violent Behavior Description:  (UTA) Does patient have access to weapons?:  (UTA) Criminal Charges Pending?:  (UTA) Does patient have a court date:  (UTA) Is patient on probation?:  (UTA)  Psychosis Hallucinations:  (UTA) Delusions:  (UTA)  Mental Status Report Appearance/Hygiene: In scrubs Eye Contact: Poor Motor Activity: Freedom of movement Speech: Unable to assess Level of Consciousness: Unable to assess Mood:  (UTA) Affect: Unable to Assess Anxiety Level: None (noted on admission) Thought Processes: Unable to Assess Judgement: Unable to Assess Orientation: Unable to assess Obsessive Compulsive Thoughts/Behaviors: Unable to Assess  Cognitive Functioning Concentration: Unable to Assess Memory: Unable to Assess IQ:  (UTA) Insight: Unable to  Assess Impulse Control: Unable to Assess Appetite:  (UTA) Weight Loss:  (UTA) Weight Gain:  (UTA) Sleep: Unable to Assess Total Hours of Sleep:  (UTA) Vegetative Symptoms: Unable to Assess  ADLScreening [facility] Assessment Services) Patient's cognitive ability adequate to safely complete daily activities?: No (not on admission) Patient able to express need for assistance with ADLs?: No Independently performs ADLs?: Yes (appropriate for developmental age) (per observation)  Prior Inpatient Therapy Prior Inpatient Therapy:  (UTA) Prior Therapy Dates:  (UTA) Prior Therapy Facilty/Provider(s):  (UTA) Reason for Treatment:  (UTA)  Prior Outpatient Therapy Prior Outpatient Therapy:  (UTA) Prior Therapy Dates:  (UTA) Prior Therapy Facilty/Provider(s):  (UTA) Reason for Treatment:  (UTA) Does patient have an ACCT team?: No Does patient have Intensive In-House Services?  : No Does patient have [facility] services? : Yes Does patient have P4CC services?: Unknown  ADL Screening (condition at time of admission) Patient's cognitive ability adequate to safely complete daily activities?: No (not on admission) Is the patient deaf or have difficulty hearing?: No (per notes and observation) Does the patient have difficulty seeing, even when wearing glasses/contacts?: No (no per notes and observation) Does the patient have difficulty concentrating, remembering, or making decisions?: Yes Patient able to express need for assistance with ADLs?: No Does the patient have difficulty dressing or bathing?: No Independently performs ADLs?: Yes (appropriate for developmental age) (per observation) Does the patient have difficulty walking or climbing stairs?: No Weakness of Legs: None Weakness of Arms/Hands: None  Home Assistive Devices/Equipment Home Assistive  Devices/Equipment: None  Therapy Consults (therapy consults require a physician order) PT Evaluation Needed: No OT Evalulation Needed: No SLP  Evaluation Needed: No Abuse/Neglect Assessment (Assessment to be complete while patient is alone) Physical Abuse:  (UTA) Verbal Abuse:  (UTA) Sexual Abuse:  (UTA) Exploitation of patient/patient's resources:  (UTA) Self-Neglect:  (UTA) Values / Beliefs Cultural Requests During Hospitalization:  (UTA) Spiritual Requests During Hospitalization:  (UTA) Consults Spiritual Care Consult Needed: No Social Work Consult Needed: No Merchant navy officer (For Healthcare) Does patient have an advance directive?: No Would patient like information on creating an advanced directive?: No - patient declined information (pt declined)    Additional Information 1:1 In Past 12 Months?: No CIRT Risk:  (UTA) Elopement Risk:  (UTA) Does patient have medical clearance?: Yes     Disposition: Case staffed with Link Snuffer FNP who recommended inpatient admission per IVC as appropriate placement is investigated Disposition Initial Assessment Completed for this Encounter: Yes Disposition of Patient: Inpatient treatment program Type of inpatient treatment program: Adult  On Site Evaluation by:   Reviewed with Physician:    Alfredia Ferguson 04/28/2016 4:53 PM

## 2016-04-28 NOTE — ED Notes (Signed)
Patient noted in room. No complaints, stable, in no acute distress. Q15 minute rounds and monitoring via security cameras continue for safety. 

## 2016-04-29 DIAGNOSIS — F6381 Intermittent explosive disorder: Secondary | ICD-10-CM

## 2016-04-29 MED ORDER — CARBAMAZEPINE 200 MG PO TABS
200.0000 mg | ORAL_TABLET | Freq: Two times a day (BID) | ORAL | Status: DC
  Administered 2016-04-29 – 2016-05-02 (×6): 200 mg via ORAL
  Filled 2016-04-29 (×6): qty 1

## 2016-04-29 MED ORDER — HALOPERIDOL 5 MG PO TABS
5.0000 mg | ORAL_TABLET | Freq: Once | ORAL | Status: AC
  Administered 2016-04-29: 5 mg via ORAL
  Filled 2016-04-29: qty 1

## 2016-04-29 MED ORDER — BENZTROPINE MESYLATE 1 MG PO TABS
1.0000 mg | ORAL_TABLET | Freq: Once | ORAL | Status: AC
  Administered 2016-04-29: 1 mg via ORAL
  Filled 2016-04-29: qty 1

## 2016-04-29 NOTE — BH Assessment (Signed)
04/29/16 1545. Attempted to call three phone numbers in pt chart: pt grandmother, mother, and the home number.  The mother's number would not go through.  The grandmother's number rang but there was no opportunity to leave a message.  The home number had an answering machine and a message was left asking for a return call. This was an attempt to gain collateral information regarding this pt as the only info we currently have is from the [facility] petition from [place]. Daleen Squibb, LCSW

## 2016-04-29 NOTE — ED Notes (Signed)
Patient is sleeping  

## 2016-04-29 NOTE — ED Notes (Signed)
Pt presents with paranoia, appears to be responding to internal stimuli, laughing inappropriately, disorganized, thought blocking noted. NP notified. Special checks q 15 mins in place for safety. Video monitoring in place.

## 2016-04-29 NOTE — ED Notes (Signed)
Patient noted in room. No complaints, stable, in no acute distress. Q15 minute rounds and monitoring via security cameras continue for safety. 

## 2016-04-29 NOTE — Consult Note (Signed)
[facility] Face-to-Face Psychiatry Consult   Reason for Consult:  Psychiatric Evaluation Referring Physician:  EDP Patient Identification: [name] MRN:  [id number] Principal Diagnosis: Intermittent explosive disorder Diagnosis:   Patient Active Problem List   Diagnosis Date Noted  . Intermittent explosive disorder [F63.81] 04/29/2016    Total Time spent with patient: 45 minutes  Subjective:   [name] is a 25 y.o. male patient who states "I don't know why I'm here."  HPI:  [name] is a 25 yo [place] American male who presented to [place] ED under IVC. The patient is seen face-to-face with Dr. Linus Orn. The patient is alert and cooperative. He is able to slowly state the year. He cannot states month or date and states Barack Obama Apparently, the aunt placed the patient under IVC and no collateral information was able to obtained from the aunt. Per chart review, contact was made with Corinna Capra, RN at [facility] who initially interviewed the patient. The patient was brought to [facility] by his aunt who stated the patient made statements of wanting. The patient would not speak at [facility] or participate in an evaluation so he was sent to [facility] ED for evaluation.   The patient states he smoked marijuana and "started doing wild stuff." He would not elaborate on what he meant by "wild stuff." His UDS is negative for opiates, cocaine, benzodiazepines, amphetamines, THC, and barbituates.  He denies alcohol use; BAL < 5. He states he dropped out of school in the 8th grade. He denies suicidal and homicidal ideation. He appears to be responding to internal stimuli.  Past Psychiatric History: Depression  Risk to Self: Suicidal Ideation: Yes-Currently Present (per IVC) Suicidal Intent: Yes-Currently Present Is patient at risk for suicide?: Yes Suicidal Plan?: No (not per IVC) Access to Means: No (Not per IVC) What has been your use of drugs/alcohol within the last 12 months?:  (UTA UDS was  neg) How many times?:  (UTA) Other Self Harm Risks:  (UTA) Triggers for Past Attempts:  (UTA) Intentional Self Injurious Behavior:  (UTA) Risk to Others: Homicidal Ideation: Yes-Currently Present (per IVC) Thoughts of Harm to Others: Yes-Currently Present (per IVC) Comment - Thoughts of Harm to Others: Yes per IVC Current Homicidal Intent: Yes-Currently Present (per IVC) Current Homicidal Plan: No Access to Homicidal Means: No Identified Victim: patient's aunt History of harm to others?:  (UTA) Assessment of Violence: None Noted Violent Behavior Description:  (UTA) Does patient have access to weapons?:  (UTA) Criminal Charges Pending?:  (UTA) Does patient have a court date:  Special educational needs teacher) Prior Inpatient Therapy: Prior Inpatient Therapy:  (UTA) Prior Therapy Dates:  (UTA) Prior Therapy Facilty/Provider(s):  (UTA) Reason for Treatment:  (UTA) Prior Outpatient Therapy: Prior Outpatient Therapy:  (UTA) Prior Therapy Dates:  (UTA) Prior Therapy Facilty/Provider(s):  (UTA) Reason for Treatment:  (UTA) Does patient have an ACCT team?: No Does patient have Intensive In-House Services?  : No Does patient have [facility] services? : Yes Does patient have P4CC services?: Unknown  Past Medical History:  Past Medical History  Diagnosis Date  . Depression     Past Surgical History  Procedure Laterality Date  . Back surgery     Family History: No family history on file. Family Psychiatric  History: unknown Social History:  History  Alcohol Use  . Yes     History  Drug Use  . Yes  . Special: Marijuana    Comment: rare    Social History   Social History  . Marital  Status: Single    Spouse Name: N/A  . Number of Children: N/A  . Years of Education: N/A   Social History Main Topics  . Smoking status: Current Some Day Smoker  . Smokeless tobacco: None  . Alcohol Use: Yes  . Drug Use: Yes    Special: Marijuana     Comment: rare  . Sexual Activity: Yes    Birth Control/  Protection: Condom   Other Topics Concern  . None   Social History Narrative   Additional Social History:    Allergies:   Allergies  Allergen Reactions  . Shellfish Allergy Hives and Itching    Labs:  Results for orders placed or performed during the hospital encounter of 04/28/16 (from the past 48 hour(s))  Comprehensive metabolic panel     Status: Abnormal   Collection Time: 04/28/16  2:29 PM  Result Value Ref Range   Sodium 140 135 - 145 mmol/L   Potassium 3.8 3.5 - 5.1 mmol/L   Chloride 103 101 - 111 mmol/L   CO2 29 22 - 32 mmol/L   Glucose, Bld 96 65 - 99 mg/dL   BUN 13 6 - 20 mg/dL   Creatinine, Ser 1.04 0.61 - 1.24 mg/dL   Calcium 9.5 8.9 - 10.3 mg/dL   Total Protein 7.7 6.5 - 8.1 g/dL   Albumin 5.0 3.5 - 5.0 g/dL   AST 21 15 - 41 U/L   ALT 14 (L) 17 - 63 U/L   Alkaline Phosphatase 46 38 - 126 U/L   Total Bilirubin 0.6 0.3 - 1.2 mg/dL   GFR calc non Af Amer >60 >60 mL/min   GFR calc Af Amer >60 >60 mL/min    Comment: (NOTE) The eGFR has been calculated using the CKD EPI equation. This calculation has not been validated in all clinical situations. eGFR's persistently <60 mL/min signify possible Chronic Kidney Disease.    Anion gap 8 5 - 15  Ethanol     Status: None   Collection Time: 04/28/16  2:29 PM  Result Value Ref Range   Alcohol, Ethyl (B) <5 <5 mg/dL    Comment:        LOWEST DETECTABLE LIMIT FOR SERUM ALCOHOL IS 5 mg/dL FOR MEDICAL PURPOSES ONLY   Salicylate level     Status: None   Collection Time: 04/28/16  2:29 PM  Result Value Ref Range   Salicylate Lvl <7.0 2.8 - 30.0 mg/dL  Acetaminophen level     Status: Abnormal   Collection Time: 04/28/16  2:29 PM  Result Value Ref Range   Acetaminophen (Tylenol), Serum <10 (L) 10 - 30 ug/mL    Comment:        THERAPEUTIC CONCENTRATIONS VARY SIGNIFICANTLY. A RANGE OF 10-30 ug/mL MAY BE AN EFFECTIVE CONCENTRATION FOR MANY PATIENTS. HOWEVER, SOME ARE BEST TREATED AT CONCENTRATIONS OUTSIDE  THIS RANGE. ACETAMINOPHEN CONCENTRATIONS >150 ug/mL AT 4 HOURS AFTER INGESTION AND >50 ug/mL AT 12 HOURS AFTER INGESTION ARE OFTEN ASSOCIATED WITH TOXIC REACTIONS.   cbc     Status: None   Collection Time: 04/28/16  2:29 PM  Result Value Ref Range   WBC 4.6 4.0 - 10.5 K/uL   RBC 4.92 4.22 - 5.81 MIL/uL   Hemoglobin 14.9 13.0 - 17.0 g/dL   HCT 44.4 39.0 - 52.0 %   MCV 90.2 78.0 - 100.0 fL   MCH 30.3 26.0 - 34.0 pg   MCHC 33.6 30.0 - 36.0 g/dL   RDW 12.8 11.5 - 15.5 %  Platelets 198 150 - 400 K/uL  Rapid urine drug screen (hospital performed)     Status: None   Collection Time: 04/28/16  2:29 PM  Result Value Ref Range   Opiates NONE DETECTED NONE DETECTED   Cocaine NONE DETECTED NONE DETECTED   Benzodiazepines NONE DETECTED NONE DETECTED   Amphetamines NONE DETECTED NONE DETECTED   Tetrahydrocannabinol NONE DETECTED NONE DETECTED   Barbiturates NONE DETECTED NONE DETECTED    Comment:        DRUG SCREEN FOR MEDICAL PURPOSES ONLY.  IF CONFIRMATION IS NEEDED FOR ANY PURPOSE, NOTIFY LAB WITHIN 5 DAYS.        LOWEST DETECTABLE LIMITS FOR URINE DRUG SCREEN Drug Class       Cutoff (ng/mL) Amphetamine      1000 Barbiturate      200 Benzodiazepine   644 Tricyclics       034 Opiates          300 Cocaine          300 THC              50     Current Facility-Administered Medications  Medication Dose Route Frequency Provider Last Rate Last Dose  . acetaminophen (TYLENOL) tablet 650 mg  650 mg Oral Q6H PRN Lurena Nida, NP      . carbamazepine (TEGRETOL) tablet 200 mg  200 mg Oral BID PC Elna Radovich, MD      . LORazepam (ATIVAN) tablet 1 mg  1 mg Oral Q6H PRN Lurena Nida, NP   1 mg at 04/28/16 2325  . traZODone (DESYREL) tablet 50 mg  50 mg Oral QHS PRN Lurena Nida, NP   50 mg at 04/28/16 2325   No current outpatient prescriptions on file.    Musculoskeletal: Strength & Muscle Tone: within normal limits Gait & Station: normal Patient leans: N/A  Psychiatric  Specialty Exam: Physical Exam  ROS  Blood pressure 128/81, pulse 71, temperature 98.8 F (37.1 C), temperature source Oral, resp. rate 18, SpO2 100 %.There is no weight on file to calculate BMI.  General Appearance: Fairly Groomed  Eye Contact:  Fair  Speech:  Blocked and Normal Rate  Volume:  Normal  Mood:  Dysphoric  Affect:  Congruent  Thought Process:  Disorganized  Orientation:  Other:  Oriented to person and place  Thought Content:  Ideas of Reference:   Paranoia  Suicidal Thoughts:  No  Homicidal Thoughts:  No  Memory:  Immediate;   Poor Recent;   Poor Remote;   Poor  Judgement:  Poor  Insight:  Lacking  Psychomotor Activity:  Restlessness  Concentration:  Concentration: Poor and Attention Span: Poor  Recall:  Poor  Fund of Knowledge:  Poor  Language:  Poor  Akathisia:  No  Handed:  Right  AIMS (if indicated):     Assets:  Desire for Improvement Leisure Time Resilience  ADL's:  Intact  Cognition:  WNL  Sleep:       Treatment Plan Summary: Daily contact with patient to assess and evaluate symptoms and progress in treatment and Medication management  Tegretol 200 mg PO BID for mood stabilization. Ativan 1 mg PO every 6 hours for anxiety, agitation Trazodone 50 mg PO at bedtime for insomnia.  TTS/social work to obtain collateral information from family.  Disposition: Recommend psychiatric Inpatient admission when medically cleared.  Serena Colonel, FNP-BC [place] 04/29/2016 3:29 PM Patient seen face-to-face for psychiatric evaluation, chart reviewed and case discussed  with the physician extender and developed treatment plan. Reviewed the information documented and agree with the treatment plan. Corena Pilgrim, MD

## 2016-04-29 NOTE — Clinical Social Work Note (Signed)
CSW provided first opinion paperwork per psychiatry request.  Forms were signed to uphold patient's IVC status.  Completed forms placed in patient's chart.  Elray Buba, LCSW [facility] Clinical Social Worker - Weekend Coverage cell #: [phone]

## 2016-04-30 NOTE — ED Notes (Signed)
Pt disorganized, responding to internal stimuli. Presents with paranoia. Compliant with medication regimen. Special checks q 15 mins in place for safety. Video monitoring in place.

## 2016-04-30 NOTE — Progress Notes (Signed)
Reassessment  Patient was sleeping but willing to awake and answer all questions.  Patient currently denies SI/HI, A/VH, and other self-injurious behaviors.  Patient answer questions but only mumbling with hand gestures.  Patient admits to daily use of marijuana but denies other drug use.  Patient repots improvement in current symptoms.    According to [facility], patient urinated on the floor last night but he cleaned himself and has not presented with any problems since.     Maryelizabeth Rowan, MSW, [facility] Triage Specialist [phone]

## 2016-04-30 NOTE — ED Notes (Signed)
Patient noted in room. No complaints, stable, in no acute distress. Q15 minute rounds and monitoring via security cameras continue for safety. 

## 2016-05-01 NOTE — Progress Notes (Addendum)
Pt lying on his bed when Cm visited him Pt noted to be unable to remain on subject Needing redirecting back to question if he has a pcp.  Pt agreed to resources for uninsured providers in [place]  CM spoke with pt who confirms uninsured [employer] resident with no pcp.  CM discussed and provided written information to assist pt with determining choice for uninsured accepting pcps, discussed the importance of pcp vs EDP services for f/u care, www.needymeds.org, www.goodrx.com, discounted pharmacies and other [employer] such as [facility] [facility] , [employer], affordable care act, financial assistance, uninsured dental services, [place] med assist, DSS and  health department  Reviewed resources for [employer] uninsured accepting pcps like [facility], family medicine at [employer], [facility], [facility], local urgent care centers, [facility], [facility] family practice, general medical clinics, family services of the [place], [facility] urgent care plus others, medication resources, [facility] out patient pharmacies and housing Pt voiced understanding and appreciation of resources provided   Provided [facility] contact information Pt unable to agreed to a referral at this time Not being able to stay focus during offer of resources but pleasant  Placed resources in locker # 41 in pt belonging bag

## 2016-05-01 NOTE — Consult Note (Signed)
[facility] Face-to-Face Psychiatry Consult   Reason for Consult:  Psychiatric Evaluation Referring Physician:  EDP Patient Identification: [name] MRN:  [id number] Principal Diagnosis: Intermittent explosive disorder Diagnosis:   Patient Active Problem List   Diagnosis Date Noted  . Intermittent explosive disorder [F63.81] 04/29/2016    Priority: High    Total Time spent with patient: 30 minutes  Subjective:   [name] is a 25 y.o. male patient who states "I don't know why I'm here."  HPI:  [name] is a 25 yo [place] American male who presented to [facility] ED under IVC. The patient is seen face-to-face with Dr. Cephus Richer. The patient is alert and cooperative. He is able to slowly state the year. He cannot states month or date and states [address] Apparently, the aunt placed the patient under IVC and no collateral information was able to obtained from the aunt. Per chart review, contact was made with Romero Belling, RN at [facility] who initially interviewed the patient. The patient was brought to [facility] by his aunt who stated the patient made statements of wanting. The patient would not speak at [facility] or participate in an evaluation so he was sent to [facility] ED for evaluation.   The patient states he smoked marijuana and "started doing wild stuff." He would not elaborate on what he meant by "wild stuff." His UDS is negative for opiates, cocaine, benzodiazepines, amphetamines, THC, and barbituates.  He denies alcohol use; BAL < 5. He states he dropped out of school in the 8th grade. He denies suicidal and homicidal ideation. He appears to be responding to internal stimuli.  Today:  Patient is more coherent today and knows where he is and his name, still cannot say what brought him to the ED.  He is taking his medications and has stayed calm.  Observe for stabilization for discharge tomorrow.  Past Psychiatric History: Depression  Risk to Self: Suicidal Ideation: Yes-Currently  Present (per IVC) Suicidal Intent: Yes-Currently Present Is patient at risk for suicide?: Yes Suicidal Plan?: No (not per IVC) Access to Means: No (Not per IVC) What has been your use of drugs/alcohol within the last 12 months?:  (UTA UDS was neg) How many times?:  (UTA) Other Self Harm Risks:  (UTA) Triggers for Past Attempts:  (UTA) Intentional Self Injurious Behavior:  (UTA) Risk to Others: Homicidal Ideation: Yes-Currently Present (per IVC) Thoughts of Harm to Others: Yes-Currently Present (per IVC) Comment - Thoughts of Harm to Others: Yes per IVC Current Homicidal Intent: Yes-Currently Present (per IVC) Current Homicidal Plan: No Access to Homicidal Means: No Identified Victim: patient's aunt History of harm to others?:  (UTA) Assessment of Violence: None Noted Violent Behavior Description:  (UTA) Does patient have access to weapons?:  (UTA) Criminal Charges Pending?:  (UTA) 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  : No Does patient have [facility] services? : Yes Does patient have P4CC services?: Unknown  Past Medical History:  Past Medical History:  Diagnosis Date  . Depression     Past Surgical History:  Procedure Laterality Date  . BACK SURGERY     Family History: No family history on file.  Family Psychiatric  History: unknown Social History:  History  Alcohol Use  . Yes     History  Drug Use  . Types: Marijuana    Comment: rare    Social History   Social History  . Marital status: Single    Spouse name: N/A  . Number of children: N/A  . Years of education:  N/A   Social History Main Topics  . Smoking status: Current Some Day Smoker  . Smokeless tobacco: None  . Alcohol use Yes  . Drug use:     Types: Marijuana     Comment: rare  . Sexual activity: Yes    Birth control/ protection: Condom   Other Topics Concern  . None   Social History Narrative  . None   Additional Social History:    Allergies:   Allergies  Allergen Reactions  . Shellfish Allergy Hives and Itching    Labs:  No results found for this or any previous visit (from the past 48 hour(s)).  Current Facility-Administered Medications  Medication Dose Route Frequency Provider Last Rate Last Dose  . acetaminophen (TYLENOL) tablet 650 mg  650 mg Oral Q6H PRN Kristeen Mans, NP      . carbamazepine (TEGRETOL) tablet 200 mg  200 mg Oral BID PC Jackelin Correia, MD   200 mg at 05/01/16 1007  . LORazepam (ATIVAN) tablet 1 mg  1 mg Oral Q6H PRN Kristeen Mans, NP   1 mg at 04/30/16 2113  . traZODone (DESYREL) tablet 50 mg  50 mg Oral QHS PRN Kristeen Mans, NP   50 mg at 04/30/16 2113   No current outpatient prescriptions on file.    Musculoskeletal: Strength & Muscle Tone: within normal limits Gait & Station: normal Patient leans: N/A  Psychiatric Specialty Exam: Physical Exam  Constitutional: He is oriented to person, place, and time. He appears well-developed and well-nourished.  HENT:  Head: Normocephalic.  Neck: Normal range of motion.  Respiratory: Effort normal.  Musculoskeletal: Normal range of motion.  Neurological: He is alert and oriented to person, place, and time.  Skin: Skin is warm and dry.  Psychiatric: His speech is normal and behavior is normal. Thought content normal. His affect is blunt. Cognition and memory are impaired. He expresses impulsivity.    Review of Systems  Constitutional: Negative.   HENT: Negative.   Eyes: Negative.   Respiratory: Negative.   Cardiovascular: Negative.   Gastrointestinal: Negative.   Genitourinary: Negative.    Musculoskeletal: Negative.   Skin: Negative.   Neurological: Negative.   Endo/Heme/Allergies: Negative.   Psychiatric/Behavioral: Positive for memory loss.    Blood pressure 137/92, pulse 74, temperature 98 F (36.7 C), temperature source Oral, resp. rate 16, SpO2 99 %.There is no height or weight on file to calculate BMI.  General Appearance: Fairly Groomed  Eye Contact:  Fair  Speech:  Blocked and Normal Rate  Volume:  Normal  Mood:  Dysphoric  Affect:  Congruent  Thought Process:  Coherent  Orientation:  Other:  Oriented to person and place  Thought Content: still some confusion  Suicidal Thoughts:  No  Homicidal Thoughts:  No  Memory:   Immediate, fair; remote, fair; recent, fair  Judgement:  Fair  Insight:  Lacking  Psychomotor Activity:  Normal  Concentration:  Concentration: Poor and Attention Span: Poor  Recall:  [employer] of Knowledge:  Fair  Language:  Good  Akathisia:  No  Handed:  Right  AIMS (if indicated):  Assets:  Desire for Improvement Leisure Time Resilience  ADL's:  Intact  Cognition:  WNL  Sleep:       Treatment Plan Summary: Daily contact with patient to assess and evaluate symptoms and progress in treatment and Medication management Intermittent Explosive Disorder: Continue:  Tegretol 200 mg PO BID for mood stabilization, Ativan 1 mg PO every 6 hours for anxiety, agitation, Trazodone 50 mg PO at bedtime for insomnia.  Individual counseling  Disposition: Recommend psychiatric Inpatient admission when medically cleared.  Nanine Means, PMH-NP [facility] 05/01/2016 10:55 AM Patient seen face-to-face for psychiatric evaluation, chart reviewed and case discussed with the physician extender and developed treatment plan. Reviewed the information documented and agree with the treatment plan. Thedore Mins, MD

## 2016-05-01 NOTE — ED Notes (Signed)
Patient has been in his room resting on the bed most of the day.  He has been up a few times.  He is taking all medications and his appetite is good.  He has been cooperative with staff.  He did become tearful this afternoon when talking about his discharge.  States his family is in [place] and he came down here on a train because he was in trouble.  He will not elaborate on what kind of trouble.  He does not have family in [place] and does not want to go to the shelter.  Advised him to get to [facility] and get hooked in with services there as soon as possible.

## 2016-05-01 NOTE — ED Notes (Signed)
Patient noted in room. No complaints, stable, in no acute distress. Q15 minute rounds and monitoring via security cameras continue for safety. 

## 2016-05-02 MED ORDER — TRAZODONE HCL 50 MG PO TABS: 50.0000 mg | ORAL_TABLET | Freq: Every evening | ORAL | 0 refills | Status: DC | PRN

## 2016-05-02 MED ORDER — CARBAMAZEPINE 200 MG PO TABS: 200.0000 mg | ORAL_TABLET | Freq: Two times a day (BID) | ORAL | 0 refills | Status: DC

## 2016-05-02 NOTE — Consult Note (Signed)
[facility] Face-to-Face Psychiatry Consult   Reason for Consult:  Psychiatric Evaluation Referring Physician:  EDP Patient Identification: [name] MRN:  [id number] Principal Diagnosis: Intermittent explosive disorder Diagnosis:   Patient Active Problem List   Diagnosis Date Noted  . Intermittent explosive disorder [F63.81] 04/29/2016    Priority: High    Total Time spent with patient: 30 minutes  Subjective:   [name] is a 25 y.o. male patient seen this morning.  Mumbles "uuhmm-mmm" and non -verbal.. Communicated via nodding head up/down/sideways.  Per nursing he was verbally communicating with nurse staff.    HPI:  [name] is a 25 yo [place] American male who presented to [facility] ED under IVC. The patient is seen face-to-face with Dr. Jannifer Franklin. The patient is alert and cooperative. He is able to slowly state the year. He cannot states month or date and states [address] Apparently, the aunt placed the patient under IVC and no collateral information was able to obtained from the aunt. Per chart review, contact was made with Romero Belling, RN at [facility] who initially interviewed the patient. The patient was brought to [facility] by his aunt who stated the patient made statements of wanting. The patient would not speak at [facility] or participate in an evaluation so he was sent to [facility] ED for evaluation.   The patient states he smoked marijuana and "started doing wild stuff." He would not elaborate on what he meant by "wild stuff." His UDS is negative for opiates, cocaine, benzodiazepines, amphetamines, THC, and barbituates.  He denies alcohol use; BAL < 5. He states he dropped out of school in the 8th grade. He denies suicidal and homicidal ideation. He appears to be responding to internal stimuli.  Today:  He is nodding head as a way to communicate.  Sounding out "uumm hum" also to respond to questions.  He is taking his medications and has stayed calm.  He is discharged today and  follow up with [facility].  Past Psychiatric History: Depression  Risk to Self: Suicidal Ideation: Yes-Currently Present (per IVC) Suicidal Intent: Yes-Currently Present Is patient at risk for suicide?: Yes Suicidal Plan?: No (not per IVC) Access to Means: No (Not per IVC) What has been your use of drugs/alcohol within the last 12 months?:  (UTA UDS was neg) How many times?:  (UTA) Other Self Harm Risks:  (UTA) Triggers for Past Attempts:  (UTA) Intentional Self Injurious Behavior:  (UTA) Risk to Others: Homicidal Ideation: Yes-Currently Present (per IVC) Thoughts of Harm to Others: Yes-Currently Present (per IVC) Comment - Thoughts of Harm to Others: Yes per IVC Current Homicidal Intent: Yes-Currently Present (per IVC) Current Homicidal Plan: No Access to Homicidal Means: No Identified Victim: patient's aunt History of harm to others?:  (UTA) Assessment of Violence: None Noted Violent Behavior Description:  (UTA) Does patient have access to weapons?:  (UTA) Criminal Charges Pending?:  (UTA) 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  : No Does patient have [facility] services? : Yes Does patient have P4CC services?: Unknown  Past Medical History:  Past Medical History:  Diagnosis Date  . Depression     Past Surgical History:  Procedure Laterality Date  . BACK SURGERY     Family History: No family history on file. Family Psychiatric  History: unknown Social History:  History  Alcohol Use  . Yes     History  Drug Use  . Types: Marijuana    Comment: rare    Social History    Social History  . Marital status: Single    Spouse name: N/A  . Number of children: N/A  . Years of education: N/A   Social History Main Topics  . Smoking status: Current Some Day Smoker  . Smokeless tobacco: None  . Alcohol use Yes  . Drug use:     Types: Marijuana     Comment: rare  . Sexual activity: Yes    Birth control/ protection: Condom   Other Topics Concern  . None   Social History Narrative  . None   Additional Social History:    Allergies:   Allergies  Allergen Reactions  . Shellfish Allergy Hives and Itching    Labs:  No results found for this or any previous visit (from the past 48 hour(s)).  Current Facility-Administered Medications  Medication Dose Route Frequency Provider Last Rate Last Dose  . acetaminophen (TYLENOL) tablet 650 mg  650 mg Oral Q6H PRN Kristeen Mans, NP      . carbamazepine (TEGRETOL) tablet 200 mg  200 mg Oral BID PC Deserai Cansler, MD   200 mg at 05/02/16 0843  . LORazepam (ATIVAN) tablet 1 mg  1 mg Oral Q6H PRN Kristeen Mans, NP   1 mg at 04/30/16 2113  . traZODone (DESYREL) tablet 50 mg  50 mg Oral QHS PRN Kristeen Mans, NP   50 mg at 04/30/16 2113   No current outpatient prescriptions on file.    Musculoskeletal: Strength & Muscle Tone: within normal limits Gait & Station: normal Patient leans: N/A  Psychiatric Specialty Exam: Physical Exam  Nursing note and vitals reviewed. Constitutional: He is oriented to person, place, and time. He appears well-developed and well-nourished.  HENT:  Head: Normocephalic.  Neck: Normal range of motion.  Respiratory: Effort normal.  Musculoskeletal: Normal range of motion.  Neurological: He is alert and oriented to person, place, and time.  Skin: Skin is warm and dry.  Psychiatric: His speech is normal and behavior is normal. Thought content normal. His affect is blunt. Cognition and memory are impaired. He expresses impulsivity.    Review of Systems  Constitutional: Negative.    HENT: Negative.   Eyes: Negative.   Respiratory: Negative.   Cardiovascular: Negative.   Gastrointestinal: Negative.   Genitourinary: Negative.   Musculoskeletal: Negative.   Skin: Negative.   Neurological: Negative.   Endo/Heme/Allergies: Negative.   Psychiatric/Behavioral: Positive for memory loss.    Blood pressure 128/92, pulse 68, temperature 98 F (36.7 C), temperature source Oral, resp. rate 16, SpO2 100 %.There is no height or weight on file to calculate BMI.  General Appearance: Fairly Groomed  Eye Contact:  Fair  Speech:  Blocked and Normal Rate  Volume:  Normal  Mood:  Dysphoric  Affect:  Congruent  Thought Process:  Coherent  Orientation:  Other:  Oriented to person and place  Thought Content: still some confusion  Suicidal Thoughts:  No  Homicidal Thoughts:  No  Memory:   Immediate, fair; remote, fair; recent, fair  Judgement:  Fair  Insight:  Lacking  Psychomotor Activity:  Normal  Concentration:  Concentration: Poor and Attention Span: Poor  Recall:  Fair  Fund of Knowledge:  Fair  Language:  Good  Akathisia:  No  Handed:  Right  AIMS (if indicated):     Assets:  Desire for Improvement Leisure Time Resilience  ADL's:  Intact  Cognition:  WNL  Sleep:       Treatment Plan Summary: Daily contact with patient to assess and evaluate symptoms and progress in treatment and Medication management Intermittent Explosive Disorder: Continue:  Tegretol 200 mg PO BID for mood stabilization, Ativan 1 mg PO every 6 hours for anxiety, agitation, Trazodone 50 mg PO at bedtime for insomnia.  Individual counseling DC to Home today with F/U [facility]  Disposition: Recommend psychiatric Inpatient admission when medically cleared.  Lindwood Qua, NP-BC [facility] 05/02/2016 11:06 AM Patient seen face-to-face for psychiatric evaluation, chart reviewed and case discussed with the physician extender and developed treatment plan. Reviewed the information  documented and agree with the treatment plan. Thedore Mins, MD

## 2016-05-02 NOTE — ED Notes (Addendum)
Pt discharged home per MD order. Pt denies SI/HI at discharge. Discharge summary reviewed with pt. Pt uninterested, reports to this nurse he does not care. Pt provided with homeless shelter resources and bus pass. RX provided. Pt signed for personal property and property returned. Pt signed e-signature. Ambulatory off unit.

## 2016-05-02 NOTE — Discharge Instructions (Signed)
For your ongoing mental health needs, you are advised to follow up with [facility].  New and returning patients are seen at their walk-in clinic.  Walk-in hours are Monday - Friday from 8:00 am - 3:00 pm.  Walk-in patients are seen on a first come, first served basis.  Try to arrive as early as possible for he best chance of being seen the same day: ° °     [facility] °     [address] °     [place], [place] [address] °     [phone] °

## 2016-05-02 NOTE — BH Assessment (Signed)
BHH Assessment Progress Note  Per Thedore Mins, MD, this pt no longer requires psychiatric hospitalization.  Pt presents under IVC initiated by Royal Hawthorn, RN at [facility], which was initially upheld by Dr Jannifer Franklin, but has now been rescinded.  Pt is to be discharged from [facility] with recommendation to follow up with [facility].  This has been included in pt's discharge instructions.  Pt's nurse, Morrie Sheldon, has been notified.  Doylene Canning, MA Triage Specialist [phone]

## 2016-05-02 NOTE — BHH Suicide Risk Assessment (Cosign Needed)
Suicide Risk Assessment  Discharge Assessment   [facility] Discharge Suicide Risk Assessment   Principal Problem: Intermittent explosive disorder Discharge Diagnoses:  Patient Active Problem List   Diagnosis Date Noted  . Intermittent explosive disorder [F63.81] 04/29/2016    Priority: High    Total Time spent with patient: 30 minutes  Musculoskeletal: Strength & Muscle Tone: within normal limits Gait & Station: normal Patient leans: N/A  Psychiatric Specialty Exam: Physical Exam  Nursing note and vitals reviewed. Constitutional: He is oriented to person, place, and time. He appears well-developed and well-nourished.  HENT:  Head: Normocephalic.  Neck: Normal range of motion.  Respiratory: Effort normal.  Musculoskeletal: Normal range of motion.  Neurological: He is alert and oriented to person, place, and time.  Skin: Skin is warm and dry.  Psychiatric: His speech is normal and behavior is normal. Thought content normal. His affect is blunt. Cognition and memory are impaired. He expresses impulsivity.    Review of Systems  Constitutional: Negative.   HENT: Negative.   Eyes: Negative.   Respiratory: Negative.   Cardiovascular: Negative.   Gastrointestinal: Negative.   Genitourinary: Negative.   Musculoskeletal: Negative.   Skin: Negative.   Neurological: Negative.   Endo/Heme/Allergies: Negative.   Psychiatric/Behavioral: Positive for memory loss.    Blood pressure 128/92, pulse 68, temperature 98 F (36.7 C), temperature source Oral, resp. rate 16, SpO2 100 %.There is no height or weight on file to calculate BMI.  General Appearance: Fairly Groomed  Eye Contact:  Fair  Speech:  Blocked and Normal Rate  Volume:  Normal  Mood:  Dysphoric  Affect:  Congruent  Thought Process:  Coherent  Orientation:  Other:  Oriented to person and place  Thought Content: alert  Suicidal Thoughts:  No  Homicidal Thoughts:  No  Memory:   Immediate, fair; remote, fair; recent, fair   Judgement:  Fair  Insight:  Lacking  Psychomotor Activity:  Normal  Concentration:  Concentration: Poor and Attention Span: Poor  Recall:  [employer] of Knowledge:  Fair  Language:  Good  Akathisia:  No  Handed:  Right  AIMS (if indicated):     Assets:  Desire for Improvement Leisure Time Resilience  ADL's:  Intact  Cognition:  WNL  Sleep:      Mental Status Per Nursing Assessment::   On Admission:     Demographic Factors:  Male and Low socioeconomic status  Loss Factors: Legal issues  Historical Factors: Impulsivity  Risk Reduction Factors:   Positive social support  Continued Clinical Symptoms:  Alcohol/Substance Abuse/Dependencies  Cognitive Features That Contribute To Risk:  Closed-mindedness and Thought constriction (tunnel vision)    Suicide Risk:  Minimal: No identifiable suicidal ideation.  Patients presenting with no risk factors but with morbid ruminations; may be classified as minimal risk based on the severity of the depressive symptoms  Plan Of Care/Follow-up recommendations:  Activity:  as tol Diet:  as tol Home D/C  Lindwood Qua, NP [facility] 05/02/2016, 11:13 AM

## 2016-05-09 ENCOUNTER — Encounter (HOSPITAL_COMMUNITY): Payer: Self-pay | Admitting: Emergency Medicine

## 2016-05-09 ENCOUNTER — Emergency Department (HOSPITAL_COMMUNITY)
Admission: EM | Admit: 2016-05-09 | Discharge: 2016-05-10 | Disposition: A | Discharge status: Other Acute Inpatient Hospital | Payer: Self-pay | Attending: Emergency Medicine | Admitting: Emergency Medicine

## 2016-05-09 DIAGNOSIS — F603 Borderline personality disorder: Secondary | ICD-10-CM

## 2016-05-09 DIAGNOSIS — F6381 Intermittent explosive disorder: Secondary | ICD-10-CM | POA: Insufficient documentation

## 2016-05-09 DIAGNOSIS — F1220 Cannabis dependence, uncomplicated: Secondary | ICD-10-CM | POA: Diagnosis not present

## 2016-05-09 DIAGNOSIS — F329 Major depressive disorder, single episode, unspecified: Secondary | ICD-10-CM | POA: Insufficient documentation

## 2016-05-09 DIAGNOSIS — Z79899 Other long term (current) drug therapy: Secondary | ICD-10-CM | POA: Insufficient documentation

## 2016-05-09 DIAGNOSIS — F17200 Nicotine dependence, unspecified, uncomplicated: Secondary | ICD-10-CM | POA: Insufficient documentation

## 2016-05-09 LAB — CBC
HCT: 44.3 % (ref 39.0–52.0)
Hemoglobin: 15.2 g/dL (ref 13.0–17.0)
MCH: 30.6 pg (ref 26.0–34.0)
MCHC: 34.3 g/dL (ref 30.0–36.0)
MCV: 89.1 fL (ref 78.0–100.0)
Platelets: 217 10*3/uL (ref 150–400)
RBC: 4.97 MIL/uL (ref 4.22–5.81)
RDW: 12.8 % (ref 11.5–15.5)
WBC: 5.6 10*3/uL (ref 4.0–10.5)

## 2016-05-09 LAB — COMPREHENSIVE METABOLIC PANEL
ALT: 13 U/L — ABNORMAL LOW (ref 17–63)
AST: 20 U/L (ref 15–41)
Albumin: 4.7 g/dL (ref 3.5–5.0)
Alkaline Phosphatase: 42 U/L (ref 38–126)
Anion gap: 8 (ref 5–15)
BUN: 13 mg/dL (ref 6–20)
CO2: 28 mmol/L (ref 22–32)
Calcium: 9.6 mg/dL (ref 8.9–10.3)
Chloride: 103 mmol/L (ref 101–111)
Creatinine, Ser: 1.03 mg/dL (ref 0.61–1.24)
GFR calc Af Amer: >60 mL/min (ref >60)
GFR calc non Af Amer: >60 mL/min (ref >60)
Glucose, Bld: 101 mg/dL — ABNORMAL HIGH (ref 65–99)
Potassium: 4.3 mmol/L (ref 3.5–5.1)
Sodium: 139 mmol/L (ref 135–145)
Total Bilirubin: 0.5 mg/dL (ref 0.3–1.2)
Total Protein: 7.8 g/dL (ref 6.5–8.1)

## 2016-05-09 LAB — ACETAMINOPHEN LEVEL: Acetaminophen (Tylenol), Serum: <10 ug/mL — ABNORMAL LOW (ref 10–30)

## 2016-05-09 LAB — SALICYLATE LEVEL: Salicylate Lvl: <4 mg/dL (ref 2.8–30.0)

## 2016-05-09 LAB — ETHANOL: Alcohol, Ethyl (B): <5 mg/dL (ref <5)

## 2016-05-09 MED ORDER — LORAZEPAM 1 MG PO TABS: 1.0000 mg | ORAL_TABLET | Freq: Three times a day (TID) | ORAL | Status: DC | PRN

## 2016-05-09 MED ORDER — ACETAMINOPHEN 325 MG PO TABS
650.0000 mg | ORAL_TABLET | ORAL | Status: DC | PRN
Start: 2016-05-09 — End: 2016-05-10

## 2016-05-09 NOTE — BH Assessment (Addendum)
Assessment Note  [name] is an 25 y.o. male. He presents to [facility] brought by mothers ex-boyfriend [name], [phone]). [name] sts that he use to date patient's mother over 2 yrs ago. He has not seen patient in 2 yrs. [name] awakened this morning to patient sitting on the porch of his home in [place], [place]. Patient lives in [place] with his grandmother who raised him. [name] asked patient what he was doing on his porch and how did he get to [place]. Patient told [name] that he was dropped off by uncle early this morning. [name] decided to transport patient back to [place], grandmothers home. Sts that the grandmother did not want patient back in the home. [name] sts that the grandmother told him, "Take [name] to [facility] because he left without getting help". Writer did try to contact patient's grandmother [name] [phone]). The grandmothers phone rang continuously and did not provide an opportunity to leave voicemail.  Writer met with patient face to face. Patient immediately requesting to leave. Sts, "I don't want to be here let me go". He was apparently frustrated stating, "I'm going to get out of here one way or another". Patient did not seem to know or understand why he was brought to [facility]. He denies SI. No history of SI. No self mutilating behaviors. Denies depressive symptoms and anxiety related symptoms. No HI. Denies legal issues. No AVH's. Patient did report use of Cocaine and THC. He was not will to provide any details related to substance use.   Writer was not able to obtain much information from patient regarding reason of visit today. Contacted patient's grandmother as stated above and mother [name] [phone]). Each family members phones left no option to leave voice mails.  Patient bought to [facility] 04/29/2016 under IVC. Per notes from previous admission: "The patient is seen face-to-face with [name] and TTS staff. The patient was alert and cooperative.  He is able to slowly state the year. He was not oriented at that time to place, situation, and/or or time. Apparently his aunt placed the patient under IVC and no collateral information was able to obtained from the aunt. Per chart review, contact was made with Adrian Capra, RN at [facility] 04/29/2016 who initially interviewed the patient. The patient was brought to [facility] by his aunt who stated the patient made statements of wanting. The patient would not speak at [facility] or participate in an evaluation so he was sent to [facility] for evaluation. The patient states he smoked marijuana and "started doing wild stuff." He would not elaborate on what he meant by "wild stuff." His UDS is negative for opiates, cocaine, benzodiazepines, amphetamines, THC, and barbituates.  He denies alcohol use; BAL < 5. He states he dropped out of school in the 8th grade. He denies suicidal and homicidal ideation. He appears to be responding to internal stimuli."   Diagnosis:  Schizophrenia per notes   Past Medical History:  Past Medical History:  Diagnosis Date  . Depression     Past Surgical History:  Procedure Laterality Date  . BACK SURGERY      Family History: No family history on file.  Social History:  reports that he has been smoking.  He has never used smokeless tobacco. He reports that he drinks alcohol. He reports that he uses drugs, including Marijuana.  Additional Social History:  Alcohol / Drug Use Pain Medications: See MAR Prescriptions: See MAR Over the Counter: See MAR Substance #1 Name of Substance 1: Patient admits  to using Cocaine but refused to provide details regarding use.  1 - Age of First Use: unk 1 - Amount (size/oz): unk 1 - Frequency: unk 1 - Duration: unk 1 - Last Use / Amount: unk Substance #2 Name of Substance 2: Patient admits to using [facility] but refused to provide details regarding use.  2 - Age of First Use: unk 2 - Amount (size/oz): unk 2 - Frequency: unk 2 -  Duration: unk 2 - Last Use / Amount: unk  CIWA: CIWA-Ar BP: 149/87 Pulse Rate: 67 COWS:    Allergies:  Allergies  Allergen Reactions  . Shellfish Allergy Hives and Itching    Home Medications:  (Not in a hospital admission)  OB/GYN Status:  No LMP for male patient.  General Assessment Data [facility]: [facility] TTS Assessment: In system Is this a Tele or Face-to-Face Assessment?: Face-to-Face Is this an Initial Assessment or a Re-assessment for this encounter?: Initial Assessment Marital status: Single Maiden name:  (n/a) Is patient pregnant?: No Pregnancy Status: No Living Arrangements: Other relatives Can pt return to current living arrangement?: Yes Admission Status: Involuntary Is patient capable of signing voluntary admission?: Yes Referral Source: Self/Family/Friend Insurance type:  (Med Pay )  Medical Screening Exam ([place]) Medical Exam completed:  (n/a)  Crisis Care Plan Living Arrangements: Other relatives Name of Psychiatrist: None Name of Therapist: None  Education Status Is patient currently in school?: No Current Grade:  (n/a) Highest grade of school patient has completed:  (UTA) Name of school:  (NA) Contact person: NA  Risk to self with the past 6 months Suicidal Ideation: No (patient denies) Has patient been a risk to self within the past 6 months prior to admission? : No (per IVC) Suicidal Intent: No Has patient had any suicidal intent within the past 6 months prior to admission? : No (per IVC) Is patient at risk for suicide?: No Suicidal Plan?: No Has patient had any suicidal plan within the past 6 months prior to admission? : No Access to Means: No What has been your use of drugs/alcohol within the last 12 months?:  (patient admits to use of cocaine and THC) Previous Attempts/Gestures: No How many times?:  (0) Other Self Harm Risks:  (denies ) Triggers for Past Attempts: Other (Comment) (no previous suicide attempts  or gestures) Intentional Self Injurious Behavior: None Family Suicide History: Unable to assess Recent stressful life event(s):  (no stressful life events reported) Persecutory voices/beliefs?: No Depression: No Depression Symptoms:  (denies depressive symptoms ) Substance abuse history and/or treatment for substance abuse?: No Suicide prevention information given to non-admitted patients: Not applicable  Risk to Others within the past 6 months Homicidal Ideation: No Does patient have any lifetime risk of violence toward others beyond the six months prior to admission? : No (yes per IVC) Thoughts of Harm to Others: No Comment - Thoughts of Harm to Others:  (n/a) Current Homicidal Intent: No Current Homicidal Plan: No Access to Homicidal Means: No Identified Victim:  (n/a) History of harm to others?: No Assessment of Violence: None Noted Violent Behavior Description:  (patient is calm and cooperative ) Does patient have access to weapons?: No Criminal Charges Pending?: No Does patient have a court date: No Is patient on probation?: No  Psychosis Hallucinations: None noted Delusions: None noted  Mental Status Report Appearance/Hygiene: In scrubs Eye Contact: Good Motor Activity: Agitation, Freedom of movement Speech: Logical/coherent, Aggressive Level of Consciousness: Alert Mood: Irritable, Preoccupied, Anxious Affect: Appropriate to  circumstance Anxiety Level: None Thought Processes: Circumstantial Judgement: Partial Orientation: Unable to assess Obsessive Compulsive Thoughts/Behaviors: None  Cognitive Functioning Concentration: Decreased Memory: Recent Intact, Remote Intact IQ: Average Insight: Poor Impulse Control: Poor Appetite: Good Weight Loss:  (none reported) Weight Gain:  (none reported) Sleep:  (no sleep reported) Total Hours of Sleep:  (patient sts, "I don't know") Vegetative Symptoms: None  ADLScreening [facility] Assessment Services) Patient's  cognitive ability adequate to safely complete daily activities?: No Patient able to express need for assistance with ADLs?: No Independently performs ADLs?: Yes (appropriate for developmental age)  Prior Inpatient Therapy Prior Inpatient Therapy: Yes (UTA) Prior Therapy Dates:  (n/a) Prior Therapy Facilty/Provider(s):  (n/a) Reason for Treatment:  (n/a)  Prior Outpatient Therapy Prior Outpatient Therapy: No Prior Therapy Dates:  (n/a) Prior Therapy Facilty/Provider(s):  (n/a) Reason for Treatment:  (n/a) Does patient have an ACCT team?: No Does patient have Intensive In-House Services?  : No Does patient have [facility] services? : No Does patient have P4CC services?: No  ADL Screening (condition at time of admission) Patient's cognitive ability adequate to safely complete daily activities?: No Is the patient deaf or have difficulty hearing?: No Does the patient have difficulty seeing, even when wearing glasses/contacts?: No Does the patient have difficulty concentrating, remembering, or making decisions?: Yes Patient able to express need for assistance with ADLs?: No Does the patient have difficulty dressing or bathing?: No Independently performs ADLs?: Yes (appropriate for developmental age) Does the patient have difficulty walking or climbing stairs?: No Weakness of Legs: None Weakness of Arms/Hands: None  Home Assistive Devices/Equipment Home Assistive Devices/Equipment: None    Abuse/Neglect Assessment (Assessment to be complete while patient is alone) Physical Abuse: Denies Verbal Abuse: Denies Sexual Abuse: Denies Exploitation of patient/patient's resources: Denies Self-Neglect: Denies Values / Beliefs Cultural Requests During Hospitalization: None Spiritual Requests During Hospitalization: None   Advance Directives (For Healthcare) Does patient have an advance directive?: No Would patient like information on creating an advanced directive?: No - patient declined  information Nutrition Screen- [facility] Adult/[facility] Patient's home diet: Regular  Additional Information 1:1 In Past 12 Months?: No CIRT Risk: No Elopement Risk: No (UTA) Does patient have medical clearance?: No     Disposition:  Disposition Initial Assessment Completed for this Encounter: Yes Type of inpatient treatment program: Adult  On Site Evaluation by:   Reviewed with Physician:      Evangeline Gula 05/09/2016 3:23 PM

## 2016-05-09 NOTE — ED Provider Notes (Signed)
[facility]-EMERGENCY DEPT Provider Note   CSN: [id number] Arrival date & time: 05/09/16  1333  First Provider Contact:  First MD Initiated Contact with Patient 05/09/16 1411        History   Chief Complaint Chief Complaint  Patient presents with  . Suicidal    HPI [name] is a 25 y.o. male.  Patient with a history of depression and possible schizophrenia presents with suicidal ideations and abnormal behavior. Reportedly he was brought in by his godparents from [place] when the patient was making expressions that he wanted to kill himself. He also has been withdrawn and aggressive at times. History is limited as patient won't talk to me. He is sitting in the chair staring forward. He will nod his head yes or no. When I asked him if he is having thoughts of suicide he indicates yes. When I asked him if he is hearing voices, he initially nods his head yes then shakes his head no. He won't answer any further questions.  He was seen here in the ED on July 21st for similar symptoms.  He denies any ETOH or drug use.      Past Medical History:  Diagnosis Date  . Depression     Patient Active Problem List   Diagnosis Date Noted  . Intermittent explosive disorder 04/29/2016    Past Surgical History:  Procedure Laterality Date  . BACK SURGERY         Home Medications    Prior to Admission medications   Medication Sig Start Date End Date Taking? Authorizing Provider  carbamazepine (TEGRETOL) 200 MG tablet Take 1 tablet (200 mg total) by mouth 2 (two) times daily after a meal. 05/02/16  Yes Adonis Brook, NP  traZODone (DESYREL) 50 MG tablet Take 1 tablet (50 mg total) by mouth at bedtime as needed for sleep. 05/02/16  Yes Adonis Brook, NP    Family History No family history on file.  Social History Social History  Substance Use Topics  . Smoking status: Current Some Day Smoker  . Smokeless tobacco: Never Used  . Alcohol use Yes     Allergies   Shellfish  allergy   Review of Systems Review of Systems  Unable to perform ROS: Psychiatric disorder     Physical Exam Updated Vital Signs BP 136/83 (BP Location: Left Arm)   Pulse 70   Temp 98.8 F (37.1 C) (Oral)   Resp 16   SpO2 100%   Physical Exam  Constitutional: He is oriented to person, place, and time. He appears well-developed and well-nourished.  HENT:  Head: Normocephalic and atraumatic.  Eyes: Pupils are equal, round, and reactive to light.  Neck: Normal range of motion. Neck supple.  Cardiovascular: Normal rate, regular rhythm and normal heart sounds.   Pulmonary/Chest: Effort normal and breath sounds normal. No respiratory distress. He has no wheezes. He has no rales. He exhibits no tenderness.  Abdominal: Soft. Bowel sounds are normal. There is no tenderness. There is no rebound and no guarding.  Musculoskeletal: Normal range of motion. He exhibits no edema.  Lymphadenopathy:    He has no cervical adenopathy.  Neurological: He is alert and oriented to person, place, and time.  Skin: Skin is warm and dry. No rash noted.  Psychiatric: His affect is labile. He is agitated and withdrawn. He is noncommunicative.     ED Treatments / Results  Labs (all labs ordered are listed, but only abnormal results are displayed) Labs Reviewed  COMPREHENSIVE METABOLIC  PANEL - Abnormal; Notable for the following:       Result Value   Glucose, Bld 101 (*)    ALT 13 (*)    All other components within normal limits  ACETAMINOPHEN LEVEL - Abnormal; Notable for the following:    Acetaminophen (Tylenol), Serum <10 (*)    All other components within normal limits  ETHANOL  SALICYLATE LEVEL  CBC  URINE RAPID DRUG SCREEN, HOSP PERFORMED    EKG  EKG Interpretation None       Radiology No results found.  Procedures Procedures (including critical care time)  Medications Ordered in ED Medications  LORazepam (ATIVAN) tablet 1 mg (not administered)  acetaminophen (TYLENOL)  tablet 650 mg (not administered)     Initial Impression / Assessment and Plan / ED Course  I have reviewed the triage vital signs and the nursing notes.  Pertinent labs & imaging results that were available during my care of the patient were reviewed by me and considered in my medical decision making (see chart for details).  Clinical Course    Pt has been medically cleared, pending TTS evaluation  Final Clinical Impressions(s) / ED Diagnoses   Final diagnoses:  Intermittent explosive disorder  Explosive personality disorder    New Prescriptions New Prescriptions   No medications on file     Rolan Bucco, MD 05/09/16 [phone]

## 2016-05-09 NOTE — ED Triage Notes (Signed)
Patient brought in by "god parent" and states that he found patient sitting on his porch in [place].  Patient told god parent that his uncle brought him there.  Patient was seen here recently and god parent per patient's grandmother who raised him since age 25 states that he walked out and didn't get treatment, so he bringing him here to get help.  Patient having SI with out a plan.

## 2016-05-09 NOTE — ED Notes (Signed)
Patient is resting comfortably. 

## 2016-05-09 NOTE — ED Notes (Signed)
Patient is guarded with some disorganized thoughts. States "I just want to leave".  States he is homeless. Fluids given.  Support offered.

## 2016-05-09 NOTE — ED Notes (Signed)
Patient continues to have thought blocking.

## 2016-05-09 NOTE — ED Notes (Signed)
Patient wanded by Security.  Patient has two belonging bags at triage nurse's desk

## 2016-05-09 NOTE — [facility]HH Suicide Risk Assessment (Deleted)
Suicide Risk Assessment  Discharge Assessment   [facility] Discharge Suicide Risk Assessment   Principal Problem: <principal problem not specified> Discharge Diagnoses:  Patient Active Problem List   Diagnosis Date Noted  . Intermittent explosive disorder [F63.81] 04/29/2016    Priority: High    Total Time spent with patient: 45 minutes , based on TTS assessment  Musculoskeletal: Strength & Muscle Tone: within normal limits Gait & Station: normal Patient leans: N/A  Psychiatric Specialty Exam:   Blood pressure 149/87, pulse 67, temperature 98.2 F (36.8 C), temperature source Oral, resp. rate 18, SpO2 100 %.There is no height or weight on file to calculate BMI.  General Appearance: Casual  Eye Contact::  Good  Speech:  Normal Rate409  Volume:  Normal  Mood:  Anxious  Affect:  Congruent  Thought Process:  Descriptions of Associations: Intact, disorganized at times  Orientation:  Full (Time, Place, and Person)  Thought Content:  WDL  Suicidal Thoughts:  No  Homicidal Thoughts:  No  Memory:  Immediate;   Good Recent;   Fair Remote;   Good  Judgement:  Intact  Insight:  Fair  Psychomotor Activity:  Normal  Concentration:  Good  Recall:  Good  Fund of Knowledge:Good  Language: Good  Akathisia:  No  Handed:  Right  AIMS (if indicated):     Assets:  Leisure Time Physical Health Resilience Social Support  Sleep:     Cognition: WNL  ADL's:  Intact   Mental Status Per Nursing Assessment::   On Admission:   Family brought in for disorgainzed thoughts  Demographic Factors:  Male and Adolescent or young adult  Loss Factors: NA  Historical Factors: NA  Risk Reduction Factors:   Sense of responsibility to family, Living with another person, especially a relative and Positive social support  Continued Clinical Symptoms:  Some disorganized thought processes  Cognitive Features That Contribute To Risk:  None    Suicide Risk:  Minimal: No identifiable suicidal  ideation.  Patients presenting with no risk factors but with morbid ruminations; may be classified as minimal risk based on the severity of the depressive symptoms    Plan Of Care/Follow-up recommendations:  Activity:  as tolerated Diet:  heart healthy diet  Adrian Daddona, NP 05/09/2016, 5:01 PM

## 2016-05-09 NOTE — ED Notes (Signed)
[name], [phone].  Patient's ex-stepfather.

## 2016-05-10 ENCOUNTER — Inpatient Hospital Stay
Admission: AD | Admit: 2016-05-10 | Discharge: 2016-05-30 | DRG: 885 | Disposition: A | Discharge status: Home / Self Care | Payer: No Typology Code available for payment source | Source: Intra-hospital | Attending: Psychiatry | Admitting: Psychiatry

## 2016-05-10 DIAGNOSIS — Z87820 Personal history of traumatic brain injury: Secondary | ICD-10-CM | POA: Diagnosis not present

## 2016-05-10 DIAGNOSIS — R45850 Homicidal ideations: Secondary | ICD-10-CM | POA: Diagnosis present

## 2016-05-10 DIAGNOSIS — Z9889 Other specified postprocedural states: Secondary | ICD-10-CM | POA: Diagnosis not present

## 2016-05-10 DIAGNOSIS — F12151 Cannabis abuse with psychotic disorder with hallucinations: Secondary | ICD-10-CM

## 2016-05-10 DIAGNOSIS — F209 Schizophrenia, unspecified: Secondary | ICD-10-CM | POA: Diagnosis present

## 2016-05-10 DIAGNOSIS — F17200 Nicotine dependence, unspecified, uncomplicated: Secondary | ICD-10-CM | POA: Diagnosis present

## 2016-05-10 DIAGNOSIS — F29 Unspecified psychosis not due to a substance or known physiological condition: Secondary | ICD-10-CM

## 2016-05-10 DIAGNOSIS — F1290 Cannabis use, unspecified, uncomplicated: Secondary | ICD-10-CM | POA: Diagnosis present

## 2016-05-10 DIAGNOSIS — F2081 Schizophreniform disorder: Principal | ICD-10-CM | POA: Diagnosis present

## 2016-05-10 DIAGNOSIS — F1220 Cannabis dependence, uncomplicated: Secondary | ICD-10-CM

## 2016-05-10 DIAGNOSIS — F061 Catatonic disorder due to known physiological condition: Secondary | ICD-10-CM | POA: Diagnosis present

## 2016-05-10 DIAGNOSIS — G4700 Insomnia, unspecified: Secondary | ICD-10-CM | POA: Diagnosis present

## 2016-05-10 DIAGNOSIS — F603 Borderline personality disorder: Secondary | ICD-10-CM

## 2016-05-10 DIAGNOSIS — R4701 Aphasia: Secondary | ICD-10-CM | POA: Diagnosis present

## 2016-05-10 DIAGNOSIS — E538 Deficiency of other specified B group vitamins: Secondary | ICD-10-CM | POA: Diagnosis present

## 2016-05-10 DIAGNOSIS — R45851 Suicidal ideations: Secondary | ICD-10-CM | POA: Diagnosis present

## 2016-05-10 DIAGNOSIS — F6381 Intermittent explosive disorder: Secondary | ICD-10-CM

## 2016-05-10 MED ORDER — TRAZODONE HCL 100 MG PO TABS: 100.0000 mg | ORAL_TABLET | Freq: Every day | ORAL | Status: DC

## 2016-05-10 MED ORDER — CARBAMAZEPINE ER 200 MG PO TB12
200.0000 mg | ORAL_TABLET | Freq: Two times a day (BID) | ORAL | Status: DC
  Administered 2016-05-10: 200 mg via ORAL
  Filled 2016-05-10 (×2): qty 1

## 2016-05-10 MED ORDER — HALOPERIDOL 1 MG PO TABS
1.0000 mg | ORAL_TABLET | Freq: Two times a day (BID) | ORAL | Status: DC
  Administered 2016-05-10: 1 mg via ORAL
  Filled 2016-05-10: qty 1

## 2016-05-10 MED ORDER — HALOPERIDOL 0.5 MG PO TABS
1.0000 mg | ORAL_TABLET | Freq: Two times a day (BID) | ORAL | Status: DC
  Administered 2016-05-10 – 2016-05-11 (×2): 1 mg via ORAL
  Filled 2016-05-10 (×2): qty 2

## 2016-05-10 MED ORDER — BENZTROPINE MESYLATE 1 MG PO TABS: 0.5000 mg | ORAL_TABLET | Freq: Every day | ORAL | Status: DC

## 2016-05-10 MED ORDER — ALUM & MAG HYDROXIDE-SIMETH 200-200-20 MG/5ML PO SUSP: 30.0000 mL | ORAL | Status: DC | PRN

## 2016-05-10 MED ORDER — TRAZODONE HCL 100 MG PO TABS
100.0000 mg | ORAL_TABLET | Freq: Every day | ORAL | Status: DC
Start: 2016-05-10 — End: 2016-05-12
  Administered 2016-05-10 – 2016-05-11 (×2): 100 mg via ORAL
  Filled 2016-05-10 (×2): qty 1

## 2016-05-10 MED ORDER — ACETAMINOPHEN 325 MG PO TABS
650.0000 mg | ORAL_TABLET | Freq: Four times a day (QID) | ORAL | Status: DC | PRN
Start: 2016-05-10 — End: 2016-05-30

## 2016-05-10 MED ORDER — MAGNESIUM HYDROXIDE 400 MG/5ML PO SUSP: 30.0000 mL | Freq: Every day | ORAL | Status: DC | PRN

## 2016-05-10 NOTE — BH Assessment (Signed)
BHH Assessment Progress Note  Per Thedore Mins,, MD, this pt requires psychiatric hospitalization at this time.  Per [name], Transfer Coordinator, pt has been accepted to [facility] by Dr Ardyth Harps to Rm 316A.  EDP Shaune Pollack, MD concurs with this decision, and has initiated IVC for the pt.  IVC documents have been faxed to [facility], and at 15:54 Hortense Ramal confirms receipt.  As of this writing service of Findings and Custody Order is pending.  Posey Rea, LCSW agrees to fax IVC documents to [facility] when they are served.  Thereafter, pt will be transported via [facility].  Pt's nurse, Morrie Sheldon, has been notified and agrees to call report to [phone].  Doylene Canning, MA Triage Specialist [phone]

## 2016-05-10 NOTE — ED Provider Notes (Signed)
25 year old male with past medical history of possible schizophrenia who presents with suicidal ideations and abnormal behavior. Patient was seen by TTS and has been accepted to an outside facility. He was previously here voluntarily, but on my assessment, he is disoriented, with thought blocking and responding to internal stimuli. IVC placed for transport and patient transported to outside facility. Screening examination and face-to-face evaluation performed just prior to transfer and the patient appears medically stable and safe for transfer.   Shaune Pollack, MD 05/10/16 [phone]

## 2016-05-10 NOTE — Progress Notes (Signed)
CSW was informed by patient's Nurse that [name], Aunt wanted to speak with someone. CSW spoke with Mrs. [name] and she stated she was upset that patient was discharged from his last admission. She stated patient is not competent and that patient could hurt himself or someone else. She stated the patient resides with her and her grandchildren. She stated she wanted to speak with the Psychiatrist and CSW informed her that information is obtained from families to assist by CSW or Counselors. CSW also informed her that at this time, the recommendation for patient is inpatient treatment, however, should patient become stabilized while here in the ED, patient would be discharged. She stated she understood and that she had plans to come in the morning around 10:00am to speak with the Psychiatrist. No further questions noted for CSW at this time.  [name], Aunt [phone]   Elenore Paddy [phone] ED CSW 05/10/2016 2:05 PM

## 2016-05-10 NOTE — Consult Note (Signed)
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 Intermittent explosive disorder [F63.81] 04/29/2016    Priority: High  . Psychosis, confusion, reactive [F44.89] 05/10/2016  . Cannabis use disorder, moderate, dependence ([place]) [F12.20] 05/10/2016    Total Time spent with patient: 45 minutes  Subjective:   [name] is a 25 y.o. male patient admitted with psychosis, drug induced.  HPI:  On admission:  25 y.o. male. He presents to [facility] brought by mothers ex-boyfriend [name], [phone]). [name] sts that he use to date patient's mother over 2 yrs ago. He has not seen patient in 2 yrs. [name] awakened this morning to patient sitting on the porch of his home in [place], [place]. Patient lives in [place] with his grandmother who raised him. [name] asked patient what he was doing on his porch and how did he get to [place]. Patient told [name] that he was dropped off by uncle early this morning. [name] decided to transport patient back to [place], grandmothers home. Sts that the grandmother did not want patient back in the home. [name] sts that the grandmother told him, "Take [name] to [facility] because he left without getting help". Writer did try to contact patient's grandmother [name] [phone]). The grandmothers phone rang continuously and did not provide an opportunity to leave voicemail.  Writer met with patient face to face. Patient immediately requesting to leave. Sts, "I don't want to be here let me go". He was apparently frustrated stating, "I'm going to get out of here one way or another". Patient did not seem to  know or understand why he was brought to [facility]. He denies SI. No history of SI. No self mutilating behaviors. Denies depressive symptoms and anxiety related symptoms. No HI. Denies legal issues. No AVH's. Patient did report use of Cocaine and THC. He was not will to provide any details related to substance use.   Writer was not able to obtain much information from patient regarding reason of visit today. Contacted patient's grandmother as stated above and mother [name] [phone]). Each family members phones left no option to leave voice mails.  Patient bought to [facility] 04/29/2016 under IVC. Per notes from previous admission: "The patient is seen face-to-face with Dr. Radene Ou and TTS staff. The patient was alert and cooperative. He is able to slowly state the year. He was not oriented at that time to place, situation, and/or or time. Apparently his aunt placed the patient under IVC and no collateral information was able to obtained from the aunt. Per chart review, contact was made with Corinna Capra, RN at [facility] 04/29/2016 who initially interviewed the patient. The patient was brought to [facility] by his aunt who stated the patient made statements of wanting. The patient would not speak at [facility] or participate in an evaluation so he was sent to [facility] ED for evaluation. The patient states he smoked marijuana and "started doing wild stuff." He would not elaborate on what he meant by "wild stuff." His UDS is negative for opiates, cocaine, benzodiazepines, amphetamines, THC, and barbituates. He denies alcohol use; BAL <5.He states he dropped out of school in the 8th grade. He denies suicidal and homicidal ideation. He appears to  be responding to internal stimuli."  Today, the patient continues to respond to internal stimuli with disorganization and thought blocking.  He cannot tell us why he came to the ED, focused on back injury but denies pain.  Past Psychiatric History: cannabis abuse,  intermittent explosive disorder  Risk to Self: Suicidal Ideation: No (patient denies) Suicidal Intent: No Is patient at risk for suicide?: No Suicidal Plan?: No Access to Means: No What has been your use of drugs/alcohol within the last 12 months?:  (patient admits to use of cocaine and THC) How many times?:  (0) Other Self Harm Risks:  (denies ) Triggers for Past Attempts: Other (Comment) (no previous suicide attempts or gestures) Intentional Self Injurious Behavior: None Risk to Others: Homicidal Ideation: No Thoughts of Harm to Others: No Comment - Thoughts of Harm to Others:  (n/a) Current Homicidal Intent: No Current Homicidal Plan: No Access to Homicidal Means: No Identified Victim:  (n/a) History of harm to others?: No Assessment of Violence: None Noted Violent Behavior Description:  (patient is calm and cooperative ) Does patient have access to weapons?: No Criminal Charges Pending?: No Does patient have a court date: No Prior Inpatient Therapy: Prior Inpatient Therapy: Yes (UTA) Prior Therapy Dates:  (n/a) Prior Therapy Facilty/Provider(s):  (n/a) Reason for Treatment:  (n/a) Prior Outpatient Therapy: Prior Outpatient Therapy: No Prior Therapy Dates:  (n/a) Prior Therapy Facilty/Provider(s):  (n/a) Reason for Treatment:  (n/a) Does patient have an ACCT team?: No Does patient have Intensive In-House Services?  : No Does patient have [facility] services? : No Does patient have P4CC services?: No  Past Medical History:  Past Medical History:  Diagnosis Date  . Depression     Past Surgical History:  Procedure Laterality Date  . BACK SURGERY     Family History: No family history on file. Family Psychiatric  History: none Social History:  History  Alcohol Use  . Yes     History  Drug Use  . Types: Marijuana    Comment: rare    Social History   Social History  . Marital status: Single    Spouse name: N/A  . Number of children: N/A  . Years of education:  N/A   Social History Main Topics  . Smoking status: Current Some Day Smoker  . Smokeless tobacco: Never Used  . Alcohol use Yes  . Drug use:     Types: Marijuana     Comment: rare  . Sexual activity: Yes    Birth control/ protection: Condom   Other Topics Concern  . None   Social History Narrative  . None   Additional Social History:    Allergies:   Allergies  Allergen Reactions  . Shellfish Allergy Hives and Itching    Labs:  Results for orders placed or performed during the hospital encounter of 05/09/16 (from the past 48 hour(s))  Comprehensive metabolic panel     Status: Abnormal   Collection Time: 05/09/16  2:02 PM  Result Value Ref Range   Sodium 139 135 - 145 mmol/L   Potassium 4.3 3.5 - 5.1 mmol/L   Chloride 103 101 - 111 mmol/L   CO2 28 22 - 32 mmol/L   Glucose, Bld 101 (H) 65 - 99 mg/dL   BUN 13 6 - 20 mg/dL   Creatinine, Ser 1.03 0.61 - 1.24 mg/dL   Calcium 9.6 8.9 - 10.3 mg/dL   Total Protein 7.8 6.5 - 8.1 g/dL   Albumin 4.7 3.5 - 5.0 g/dL  AST 20 15 - 41 U/L   ALT 13 (L) 17 - 63 U/L   Alkaline Phosphatase 42 38 - 126 U/L   Total Bilirubin 0.5 0.3 - 1.2 mg/dL   GFR calc non Af Amer >60 >60 mL/min   GFR calc Af Amer >60 >60 mL/min    Comment: (NOTE) The eGFR has been calculated using the CKD EPI equation. This calculation has not been validated in all clinical situations. eGFR's persistently <60 mL/min signify possible Chronic Kidney Disease.    Anion gap 8 5 - 15  Ethanol     Status: None   Collection Time: 05/09/16  2:02 PM  Result Value Ref Range   Alcohol, Ethyl (B) <5 <5 mg/dL    Comment:        LOWEST DETECTABLE LIMIT FOR SERUM ALCOHOL IS 5 mg/dL FOR MEDICAL PURPOSES ONLY   Salicylate level     Status: None   Collection Time: 05/09/16  2:02 PM  Result Value Ref Range   Salicylate Lvl <1.2 2.8 - 30.0 mg/dL  Acetaminophen level     Status: Abnormal   Collection Time: 05/09/16  2:02 PM  Result Value Ref Range   Acetaminophen  (Tylenol), Serum <10 (L) 10 - 30 ug/mL    Comment:        THERAPEUTIC CONCENTRATIONS VARY SIGNIFICANTLY. A RANGE OF 10-30 ug/mL MAY BE AN EFFECTIVE CONCENTRATION FOR MANY PATIENTS. HOWEVER, SOME ARE BEST TREATED AT CONCENTRATIONS OUTSIDE THIS RANGE. ACETAMINOPHEN CONCENTRATIONS >150 ug/mL AT 4 HOURS AFTER INGESTION AND >50 ug/mL AT 12 HOURS AFTER INGESTION ARE OFTEN ASSOCIATED WITH TOXIC REACTIONS.   cbc     Status: None   Collection Time: 05/09/16  2:02 PM  Result Value Ref Range   WBC 5.6 4.0 - 10.5 K/uL   RBC 4.97 4.22 - 5.81 MIL/uL   Hemoglobin 15.2 13.0 - 17.0 g/dL   HCT 44.3 39.0 - 52.0 %   MCV 89.1 78.0 - 100.0 fL   MCH 30.6 26.0 - 34.0 pg   MCHC 34.3 30.0 - 36.0 g/dL   RDW 12.8 11.5 - 15.5 %   Platelets 217 150 - 400 K/uL    Current Facility-Administered Medications  Medication Dose Route Frequency Provider Last Rate Last Dose  . acetaminophen (TYLENOL) tablet 650 mg  650 mg Oral Q4H PRN Malvin Johns, MD      . carbamazepine (TEGRETOL XR) 12 hr tablet 200 mg  200 mg Oral BID Patrecia Pour, NP   200 mg at 05/10/16 8786  . haloperidol (HALDOL) tablet 1 mg  1 mg Oral BID Corena Pilgrim, MD   1 mg at 05/10/16 1032  . traZODone (DESYREL) tablet 100 mg  100 mg Oral QHS Patrecia Pour, NP       Current Outpatient Prescriptions  Medication Sig Dispense Refill  . carbamazepine (TEGRETOL) 200 MG tablet Take 1 tablet (200 mg total) by mouth 2 (two) times daily after a meal. 60 tablet 0  . traZODone (DESYREL) 50 MG tablet Take 1 tablet (50 mg total) by mouth at bedtime as needed for sleep. 30 tablet 0    Musculoskeletal: Strength & Muscle Tone: within normal limits Gait & Station: normal Patient leans: N/A  Psychiatric Specialty Exam: Physical Exam  Constitutional: He is oriented to person, place, and time. He appears well-developed and well-nourished.  HENT:  Head: Normocephalic.  Neck: Normal range of motion.  Respiratory: Effort normal.  Musculoskeletal:  Normal range of motion.  Neurological: He is alert and oriented  to person, place, and time.  Skin: Skin is warm and dry.  Psychiatric: His speech is normal. Thought content normal. His mood appears anxious. His affect is blunt. He is actively hallucinating. Cognition and memory are impaired. He expresses inappropriate judgment. He is inattentive.    Review of Systems  Constitutional: Negative.   HENT: Negative.   Eyes: Negative.   Respiratory: Negative.   Cardiovascular: Negative.   Gastrointestinal: Negative.   Genitourinary: Negative.   Musculoskeletal: Negative.   Skin: Negative.   Neurological: Negative.   Endo/Heme/Allergies: Negative.   Psychiatric/Behavioral: Positive for hallucinations, memory loss and substance abuse. The patient is nervous/anxious.     Blood pressure 126/76, pulse 62, temperature 98.2 F (36.8 C), temperature source Oral, resp. rate 16, SpO2 100 %.There is no height or weight on file to calculate BMI.  General Appearance: Casual  Eye Contact:  Fair  Speech:  Normal Rate  Volume:  Normal  Mood:  Anxious and Irritable  Affect:  Blunt  Thought Process:  Disorganized and Descriptions of Associations: Tangential  Orientation:  Other:  person and place  Thought Content:  Hallucinations: Auditory Visual  Suicidal Thoughts:  No  Homicidal Thoughts:  No  Memory:  Immediate;   Fair Recent;   Poor Remote;   Fair  Judgement:  Impaired  Insight:  Lacking  Psychomotor Activity:  Decreased  Concentration:  Concentration: Poor and Attention Span: Poor  Recall:  [employer] of Knowledge:  Fair  Language:  Fair  Akathisia:  No  Handed:  Right  AIMS (if indicated):     Assets:  Housing Leisure Time Physical Health Resilience Social Support  ADL's:  Intact  Cognition:  Impaired,  Moderate  Sleep:        Treatment Plan Summary: Daily contact with patient to assess and evaluate symptoms and progress in treatment, Medication management and Plan cannabis  abuse with induced psychosis with hallucinations:  -Crisis stabilization -Medication management:   Continue medical medications along with Tegretol 200 mg BID for mood stabilization and Trazodone 100 mg at bedtime for sleep.  Start Haldol 1 mg BID for psychosis, Cogentin 0.5 mg at bedtime for EPS. -Individual and substance abuse counseling  Disposition: Recommend psychiatric Inpatient admission when medically cleared.  Waylan Boga, NP 05/10/2016 11:01 AM  Patient seen face-to-face for psychiatric evaluation, chart reviewed and case discussed with the physician extender and developed treatment plan. Reviewed the information documented and agree with the treatment plan. Corena Pilgrim, MD

## 2016-05-10 NOTE — ED Notes (Signed)
Sheriff on unit to transport pt to [facility] per MD order. Pt signed for personal belongings and property given to sheriff for transport. Pt ambulatory off unit with sheriff.

## 2016-05-10 NOTE — BH Assessment (Signed)
Patient has been accepted to [facility].  Accepting physician is Dr. Ardyth Harps.  Attending Physician will be Dr. Ardyth Harps.  Patient has been assigned to room 316A, by [facility] Charge Nurse [place].  Call report to [phone].  Representative/Transfer Coordinator is Nicola Girt ER Staff Maisie Fus, TTS) made aware of acceptance.

## 2016-05-10 NOTE — Tx Team (Signed)
Initial Interdisciplinary Treatment Plan   PATIENT STRESSORS: Medication change or noncompliance Substance abuse     PATIENT STRENGTHS: Active sense of humor Motivation for treatment/growth   PROBLEM LIST: Problem List/Patient Goals Date to be addressed Date deferred Reason deferred Estimated date of resolution  Substance abuse 05/10/2016     depression 05/10/2016                                                DISCHARGE CRITERIA:  Improved stabilization in mood, thinking, and/or behavior  PRELIMINARY DISCHARGE PLAN: Return to previous living arrangement  PATIENT/FAMIILY INVOLVEMENT: This treatment plan has been presented to and reviewed with the patient, [name], and/or family member.  The patient and family have been given the opportunity to ask questions and make suggestions.  Foster Simpson 05/10/2016, 11:21 PM

## 2016-05-10 NOTE — Progress Notes (Addendum)
ED CM left pt uninsured [place] resources in his locker #42 CM spoke with pt who confirms uninsured [employer] resident with no pcp.  CM provided written information to assist pt with determining choice for uninsured accepting pcps, discussed the importance of pcp vs EDP services for f/u care, www.needymeds.org, www.goodrx.com, discounted pharmacies and other [employer] such as [facility] , [facility], affordable care act, financial assistance, uninsured dental services, [place] med assist, DSS and  health department  Provided resources for [employer] uninsured accepting pcps like [facility]ussmaul, family medicine at [facility], [facility], [facility], local urgent care centers, [facility], [facility] family practice, general medical clinics, family services of the [employer], [facility] urgent care plus others, medication resources, [facility] out patient pharmacies and housing Provided [facility] contact information  Pt with 2 ED visits in the last 6 months not admssions entered in d/c instructions  Please ue the resources left for you by the ED case manager to find a doctor for follow up care         Next Steps: Schedule an appointment as soon as possible for a visit today

## 2016-05-10 NOTE — ED Notes (Signed)
Patient refused Linus Orn made aware

## 2016-05-10 NOTE — ED Notes (Signed)
Limited assessment d/t Pt disorganized, thought blocking.  Poor eye contact, pt appears to be responding to internal stimuli. Special checks q 15 mins in place for safety. Video monitoring in place.

## 2016-05-10 NOTE — Progress Notes (Signed)
CSW obtained IVC paperwork and faxed information to [name], Counselor at [employer] CSW provided information to patient's Nurse regarding Accepting Physician and the number to call report. Nurse was informed call would need to be made for transport.   [name] [phone] ED CSW 05/10/2016 6:07 PM

## 2016-05-11 ENCOUNTER — Encounter: Payer: Self-pay | Admitting: Psychiatry

## 2016-05-11 DIAGNOSIS — F29 Unspecified psychosis not due to a substance or known physiological condition: Secondary | ICD-10-CM

## 2016-05-11 MED ORDER — LORAZEPAM 2 MG PO TABS
2.0000 mg | ORAL_TABLET | Freq: Every day | ORAL | Status: DC
Start: 2016-05-11 — End: 2016-05-12
  Administered 2016-05-11: 2 mg via ORAL
  Filled 2016-05-11: qty 1

## 2016-05-11 MED ORDER — LORAZEPAM 2 MG PO TABS
2.0000 mg | ORAL_TABLET | Freq: Once | ORAL | Status: AC
  Administered 2016-05-11: 2 mg via ORAL
  Filled 2016-05-11: qty 1

## 2016-05-11 MED ORDER — OLANZAPINE 7.5 MG PO TABS
15.0000 mg | ORAL_TABLET | Freq: Every day | ORAL | Status: DC
  Administered 2016-05-11 – 2016-05-12 (×2): 15 mg via ORAL
  Filled 2016-05-11 (×3): qty 2
  Filled 2016-05-11: qty 1

## 2016-05-11 NOTE — Progress Notes (Signed)
Recreation Therapy Notes  Date: 08.03.17 Time: 9:30 am Location: Craft Room  Group Topic: Leisure Education  Goal Area(s) Addresses:  Patient will identify activities for each letter of the alphabet. Patient will verbalize ability to use leisure as a Associate Professor.  Behavioral Response: Did not attend  Intervention: Leisure Alphabet  Activity: Patients were given a Leisure Information systems manager and instructed to write healthy leisure activities for each letter of the alphabet.  Education: LRT educated patients on what they need to participate in leisure.  Education Outcome: Patient did not attend group.  Clinical Observations/Feedback: Patient did not attend group.  Jacquelynn Cree, LRT/CTRS 05/11/2016 10:13 AM

## 2016-05-11 NOTE — Progress Notes (Signed)
Patient 25 year old BM admitted to the unit with psychosis and hallucinations. Pt is disorganized, paranoid, thought blocking and responding to internal stimuli. States he's feeling lost and really high. Reports smoking marijuana but did not provide any details related to substance use. Alert and cooperative. No voiced thoughts of hurting himself. Med compliant. No PRNs given. Old scarring upper/lower back. multiple tattoos to both arms, hands, upper torso and one tear drop underneath left eye. Pt searched for contraband none found. Skin checked with another nurse. Skin warm dry and intact.

## 2016-05-11 NOTE — [facility] Group Notes (Signed)
[facility] Group Notes:  (Nursing/MHT/Case Management/Adjunct)  Date:  05/11/2016  Time:  2:28 AM  Type of Therapy:  Group Therapy  Participation Level:  Minimal  Participation Quality:  Drowsy and Inattentive  Affect:  Blunted  Cognitive:  Disorganized  Insight:  Limited  Engagement in Group:  Lacking  Modes of Intervention:  Exploration  Summary of Progress/Problems: Staff asked pt. Numerous questions about himself. Pt responded "I don't even know right now." Staff observed pt. Looking around as though he was hearing things. When staff asked the pt. Was he okay. Pt. Laughed and shrugged his shoulders.   Fanny Skates Elita Dame 05/11/2016, 2:28 AM

## 2016-05-11 NOTE — H&P (Signed)
Psychiatric Admission Assessment Adult  Patient Identification: [name] MRN:  [id number] Date of Evaluation:  05/11/2016 Chief Complaint:  Schizophrenia Principal Diagnosis: Psychosis Diagnosis:   Patient Active Problem List   Diagnosis Date Noted  . 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 Cannabis use disorder, moderate, dependence (HCC) [F12.20] 05/10/2016   History of Present Illness:   25 year old male with history of schizophrenia presented to [facility] under  IVC  from [place]. He was taken to [facility] by his and for mental health help. She reported to ER that the patient was voicing suicidal ideations as well as homicidal ideations.   Per assement in ER: Patient was minimally responsive only answering some questions by nodding. He appeared to be responding to internal stimuli.  Per [facility]: patient would not speak or seem to understand the content of questions. Patient was unable to sign admission forms or any information presented to patient at the crisis center. Per IVC: "Respondent is very disorganized in his thoughts and would not give his name or DOB. He would not sign any forms and would not speak to any of the providers. Aunt reports that he made threatening statements to her and her children. "  Patient has been recently released from prison and has been residing with his Aunt. Per notes, patient has a history of Schizophrenia. UDS was negative for all substances.  Since he was transferred to our behavioral health unit the patient has remained mainly mute. He appears to have thought blocking. He is guarded and appears paranoid. Nurses tell me that the patient look at his food for a long period of time before he started eating.  He did not respond to any questions during the assessment.  Associated Signs/Symptoms: Depression Symptoms:  Per collateral informationThe patient made some suicidal  threats prior to admission (Hypo) Manic Symptoms:   n/a Anxiety Symptoms:  n/a Psychotic Symptoms:  Hallucinations: Auditory Paranoia, PTSD Symptoms: NA   Total Time spent with patient: 1 hour  Past Psychiatric History: Not much information is available at this time. Appears that he has a history of schizophrenia. He is being followed up at [facility]. He was recently released from jail  Is the patient at risk to self? Yes.    Has the patient been a risk to self in the past 6 months? No.  Has the patient been a risk to self within the distant past? No.  Is the patient a risk to others? No.  Has the patient been a risk to others in the past 6 months? No.  Has the patient been a risk to others within the distant past? No.     Past Medical History:  Past Medical History:  Diagnosis Date  . Depression     Past Surgical History:  Procedure Laterality Date  . BACK SURGERY     Family History: History reviewed. No pertinent family history.  Family Psychiatric  History: Unable to obtain at this time  Tobacco Screening: Have you used any form of tobacco in the last 30 days? (Cigarettes, Smokeless Tobacco, Cigars, and/or Pipes): No  Social History: Unable to obtain at this time History  Alcohol Use No     History  Drug Use  . Types: Marijuana    Comment: rare    Additional Social History:      History of alcohol / drug use?: No history of alcohol / drug abuse Negative Consequences of Use: Personal relationships Name of Substance 1: marijuana daily 1 - Age  of First Use: unk 1 - Amount (size/oz): unk 1 - Frequency: unk 1 - Duration: unk 1 - Last Use / Amount: unk      Allergies:   Allergies  Allergen Reactions  . Shellfish Allergy Hives and Itching   Lab Results: No results found for this or any previous visit (from the past 48 hour(s)).  Blood Alcohol level:  Lab Results  Component Value Date   ETH <5 05/09/2016   ETH <5 04/28/2016    Metabolic Disorder Labs:  No results found for: HGBA1C, MPG No results  found for: PROLACTIN No results found for: CHOL, TRIG, HDL, CHOLHDL, VLDL, LDLCALC  Current Medications: Current Facility-Administered Medications  Medication Dose Route Frequency Provider Last Rate Last Dose  . acetaminophen (TYLENOL) tablet 650 mg  650 mg Oral Q6H PRN Jimmy Footman, MD      . alum & mag hydroxide-simeth (MAALOX/MYLANTA) 200-200-20 MG/5ML suspension 30 mL  30 mL Oral Q4H PRN Jimmy Footman, MD      . magnesium hydroxide (MILK OF MAGNESIA) suspension 30 mL  30 mL Oral Daily PRN Jimmy Footman, MD      . traZODone (DESYREL) tablet 100 mg  100 mg Oral QHS Jimmy Footman, MD   100 mg at 05/10/16 2207   PTA Medications: Prescriptions Prior to Admission  Medication Sig Dispense Refill Last Dose  . carbamazepine (TEGRETOL) 200 MG tablet Take 1 tablet (200 mg total) by mouth 2 (two) times daily after a meal. 60 tablet 0 05/10/2016 at Unknown time  . traZODone (DESYREL) 50 MG tablet Take 1 tablet (50 mg total) by mouth at bedtime as needed for sleep. 30 tablet 0 05/10/2016 at Unknown time    Musculoskeletal: Strength & Muscle Tone: within normal limits Gait & Station: normal Patient leans: N/A  Psychiatric Specialty Exam: Physical Exam  Constitutional: He is oriented to person, place, and time. He appears well-developed and well-nourished.  HENT:  Head: Normocephalic and atraumatic.  Eyes: EOM are normal.  Neck: Normal range of motion.  Respiratory: Effort normal.  Musculoskeletal: Normal range of motion.  Neurological: He is alert and oriented to person, place, and time.    Review of Systems  Unable to perform ROS: Acuity of condition    Blood pressure 128/76, pulse (!) 111, temperature 98 F (36.7 C), temperature source Oral, resp. rate 18, height  (1.651 m), weight 72.8 kg (160 lb 8 oz), SpO2 100 %.Body mass index is 26.71 kg/m.  General Appearance: Fairly Groomed  Eye Contact:  Minimal  Speech:  thought blocking   Volume:  Decreased  Mood:  Anxious  Affect:  Constricted  Thought Process:  NA  Orientation:  NA  Thought Content:  NA  Suicidal Thoughts:  n/a  Homicidal Thoughts:  n/a  Memory:  NA  Judgement:  Impaired  Insight:  Lacking  Psychomotor Activity:  Decreased  Concentration:  Concentration: Poor and Attention Span: Poor  Recall:  Poor  Fund of Knowledge:  Poor  Language:  NA  Akathisia:  No  Handed:    AIMS (if indicated):     Assets:  Physical Health  ADL's:  Impaired needs prompting  Cognition:  Impaired,  Moderate  Sleep:  Number of Hours: 8    Treatment Plan Summary:  Unspecified psychotic disorder, possible past history of schizophrenia: There is not much information available at this time. There are no records in our system.  He certainly appears to have some catatonic symptoms. He is mute, has significant thought blocking.  There was a significant delay for him to start eating during mealtimes.  I will start him on olanzapine 15 mg at bedtime  For catatonic symptoms I will start him on Ativan.  He will receive 2 mg now and then 2 mg at bedtime  For insomnia I will order Ativan 2 mg by mouth daily at bedtime  Diet regular--- will order a paranoid tray  Vital signs daily  Hospitalization status IVC  Disposition back to his aunt once stable  Follow-up back to [facility] once stable  Labs we will order hemoglobin A1c, lipid panel, TSH, prolactin, HIV, RPR, B12.  I certify that inpatient services furnished can reasonably be expected to improve the patient's condition.    Jimmy Footman, MD 8/3/20174:22 PM

## 2016-05-11 NOTE — Progress Notes (Signed)
Shadow spent most of shift resting in bed.  He remains paranoid and he is unable to engage in logical conversation.  He remained isolative in the bed, he had no interaction with peers. He keeps saying he doesn't know how he got here. He took medication and was medication compliant. He appears to be in bed resting at this time.

## 2016-05-11 NOTE — BHH Counselor (Signed)
CSW attempted to complete psychosocial assessment with patient. Patient is too acute at this time. He did not know where he was and why he was at the hospital. CSW will attempt to meet with patient at later time.  Lynden Oxford, MSW, LCSW-A 05/11/16  11:16AM

## 2016-05-11 NOTE — BHH Group Notes (Signed)
Goals Group  Date/Time: 05/11/2016 9am  Type of Therapy and Topic: Group Therapy: Goals Group: SMART Goals   Pt was called, but did not attend   Masai Kidd F. Annise Boran, LCSWA, LCAS  

## 2016-05-11 NOTE — BHH Suicide Risk Assessment (Signed)
[facility] Admission Suicide Risk Assessment   Nursing information obtained from:    Demographic factors:    Current Mental Status:    Loss Factors:    Historical Factors:    Risk Reduction Factors:     Total Time spent with patient: 1 hour Principal Problem: Psychosis Diagnosis:   Patient Active Problem List   Diagnosis Date Noted  . 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 Cannabis use disorder, moderate, dependence (HCC) [F12.20] 05/10/2016   Subjective Data:   Continued Clinical Symptoms:  Alcohol Use Disorder Identification Test Final Score (AUDIT): 0 The "Alcohol Use Disorders Identification Test", Guidelines for Use in Primary Care, Second Edition.  World Science writer [facility]). Score between 0-7:  no or low risk or alcohol related problems. Score between 8-15:  moderate risk of alcohol related problems. Score between 16-19:  high risk of alcohol related problems. Score 20 or above:  warrants further diagnostic evaluation for alcohol dependence and treatment.   CLINICAL FACTORS:   Schizophrenia:   Paranoid or undifferentiated type Currently Psychotic Previous Psychiatric Diagnoses and Treatments    Psychiatric Specialty Exam: Physical Exam  ROS  Blood pressure 128/76, pulse (!) 111, temperature 98 F (36.7 C), temperature source Oral, resp. rate 18, height 5\' 5"  (1.651 m), weight 72.8 kg (160 lb 8 oz), SpO2 100 %.Body mass index is 26.71 kg/m.                                                    Sleep:  Number of Hours: 8      COGNITIVE FEATURES THAT CONTRIBUTE TO RISK:  Loss of executive function    SUICIDE RISK:   Moderate:  Frequent suicidal ideation with limited intensity, and duration, some specificity in terms of plans, no associated intent, good self-control, limited dysphoria/symptomatology, some risk factors present, and identifiable protective factors, including available and accessible social  support.   PLAN OF CARE: admit to [facility]  I certify that inpatient services furnished can reasonably be expected to improve the patient's condition.  Jimmy Footman, MD 05/11/2016, 4:39 PM

## 2016-05-11 NOTE — Progress Notes (Signed)
Patient exhibiting paranoid behavior.  First am states "I am so hungry but I cant' go down there with all those people. "  Allowed patient to eat meals in room.  Patient isolates to room, no interaction with peers.  No group attendance.  Laughs inappropriately.  Repeatedly states "I don't know what's going on and I don't know why I am here"  Medication compliant.  Support and encouragement offered.  Safety maintained.

## 2016-05-11 NOTE — BHH Group Notes (Signed)
[facility] LCSW Group Therapy   05/11/2016  1 pm  Type of Therapy: Group Therapy   Participation Level: Did Not Attend. Patient invited to participate but declined.    Dorothe Pea. Zylan Almquist, MSW, LCSWA, LCAS

## 2016-05-11 NOTE — BHH Group Notes (Signed)
[facility] Group Notes:  (Nursing/MHT/Case Management/Adjunct)  Date:  05/11/2016  Time:  3:54 PM  Type of Therapy:  Psychoeducational Skills  Participation Level:  Did Not Attend  Twanna Hy 05/11/2016, 3:54 PM

## 2016-05-12 ENCOUNTER — Encounter: Payer: Self-pay | Admitting: Psychiatry

## 2016-05-12 DIAGNOSIS — F209 Schizophrenia, unspecified: Secondary | ICD-10-CM

## 2016-05-12 LAB — LIPID PANEL
Cholesterol: 176 mg/dL (ref 0–200)
HDL: 55 mg/dL (ref >40)
LDL Cholesterol: 59 mg/dL (ref 0–99)
Total CHOL/HDL Ratio: 3.2 RATIO
Triglycerides: 312 mg/dL — ABNORMAL HIGH (ref <150)
VLDL: 62 mg/dL — ABNORMAL HIGH (ref 0–40)

## 2016-05-12 LAB — RAPID HIV SCREEN (HIV 1/2 AB+AG)
HIV 1/2 Antibodies: NONREACTIVE
HIV-1 P24 Antigen - HIV24: NONREACTIVE

## 2016-05-12 LAB — VITAMIN B12: Vitamin B-12: 309 pg/mL (ref 180–914)

## 2016-05-12 LAB — TSH: TSH: 2.842 u[IU]/mL (ref 0.350–4.500)

## 2016-05-12 LAB — HEMOGLOBIN A1C: Hgb A1c MFr Bld: 5.2 % (ref 4.0–6.0)

## 2016-05-12 MED ORDER — LORAZEPAM 0.5 MG PO TABS
0.5000 mg | ORAL_TABLET | Freq: Every day | ORAL | Status: AC
  Administered 2016-05-12: 0.5 mg via ORAL
  Filled 2016-05-12: qty 1

## 2016-05-12 MED ORDER — LORAZEPAM 0.5 MG PO TABS: 0.5000 mg | ORAL_TABLET | Freq: Every day | ORAL | Status: DC

## 2016-05-12 MED ORDER — LORAZEPAM 0.5 MG PO TABS
0.5000 mg | ORAL_TABLET | Freq: Three times a day (TID) | ORAL | Status: DC
  Administered 2016-05-13 – 2016-05-15 (×6): 0.5 mg via ORAL
  Filled 2016-05-12 (×7): qty 1

## 2016-05-12 NOTE — BHH Suicide Risk Assessment (Signed)
B[facility] INPATIENT:  Family/Significant Other Suicide Prevention Education  Suicide Prevention Education:  Education Completed; aunt, [name] ph#: [phone] has been identified by the patient as the family member/significant other with whom the patient will be residing, and identified as the person(s) who will aid the patient in the event of a mental health crisis (suicidal ideations/suicide attempt).  With written consent from the patient, the family member/significant other has been provided the following suicide prevention education, prior to the and/or following the discharge of the patient. Pt 's aunt stated that he has had bizarre behaviors for awhile. She stated that pt is always paranoid and afraid and sometimes forget how to function properly. She stated that he is disorganized and has hallucinations often. Pt's aunt is concerned about pt's mental health because he has not been treated for these symptoms that has been going for awhile.  The suicide prevention education provided includes the following:  Suicide risk factors  Suicide prevention and interventions  National Suicide Hotline telephone number  [facility] assessment telephone number  [facility] Emergency Assistance 911  [facility] and/or Residential Mobile Crisis Unit telephone number  Request made of family/significant other to:  Remove weapons (e.g., guns, rifles, knives), all items previously/currently identified as safety concern.    Remove drugs/medications (over-the-counter, prescriptions, illicit drugs), all items previously/currently identified as a safety concern.  The family member/significant other verbalizes understanding of the suicide prevention education information provided.  The family member/significant other agrees to remove the items of safety concern listed above.  Lynden Oxford, MSW, LCSW-A 05/12/2016, 2:45 PM

## 2016-05-12 NOTE — Progress Notes (Signed)
D:  Isolates to room.  Continues to exhibit paranoid behaviors.  States "I can't be around people.  "  Unable to verbalize why he is here.  Continually repeats "I am tired.  I need to be out"  Affect depressed.  Forwards very little information. A:  Support and encouragement offered. Q 15 minute rounding done.  R:  Unwilling to participate in treatment. Safety maintained.

## 2016-05-12 NOTE — BHH Counselor (Signed)
CSW attempted to complete psychosocial assessment with patient. Patient is too acute at this time. He did not know where he was and why he was at the hospital. CSW will attempt to meet with patient at later time.  Lynden Oxford, MSW, LCSW-A 05/12/16  2:50PM

## 2016-05-12 NOTE — BHH Suicide Risk Assessment (Signed)
CSW contacted pt's aunt, [name] ph#:[phone] to obtain more information in regards to pt's current illness and history. CSW left message and is awaiting call back.   Lynden Oxford, MSW, LCSW-A 11:57AM  05/12/16

## 2016-05-12 NOTE — BHH Group Notes (Signed)
[facility] Group Notes:  (Nursing/MHT/Case Management/Adjunct)  Date:  05/12/2016  Time:  3:59 PM  Type of Therapy:  Psychoeducational Skills  Participation Level:  Did Not Attend  Twanna Hy 05/12/2016, 3:59 PM

## 2016-05-12 NOTE — Tx Team (Addendum)
Interdisciplinary Treatment Plan Update (Adult)         Date: 05/12/2016   Time Reviewed: 10:30 AM   Progress in Treatment: Improving Attending groups: Yes  Participating in groups: Yes  Taking medication as prescribed: Yes  Tolerating medication: Yes  Family/Significant other contact made: CSW is still assessing proper contacts Patient understands diagnosis: Yes  Discussing patient identified problems/goals with staff: Yes  Medical problems stabilized or resolved: Yes  Denies suicidal/homicidal ideation: Yes  Issues/concerns per patient self-inventory: Yes  Other:   New problem(s) identified: N/A   Discharge Plan or Barriers: see below   Reason for Continuation of Hospitalization:   Depression   Anxiety   Medication Stabilization   Comments: N/A   Estimated length of stay: 5 days    Patient is a 25 year old  male admitted for psychosis. Patient lives in [place], [place]. 25-year-old male with history of schizophrenia presented to [facility] under  IVC  from [facility]. He was taken to [facility] by his and for mental health help. She reported to ER that the patient was voicing suicidal ideations as well as homicidal ideations.  Patient will benefit from crisis stabilization, medication evaluation, group therapy, and psycho education in addition to case management for discharge planning. Patient and CSW reviewed pt's identified goals and treatment plan. Pt verbalized understanding and agreed to treatment plan.    Review of initial/current patient goals per problem list:  1. Goal(s): Patient will participate in aftercare plan   Met: Goal Progressing   Target date: 3-5 days post admission date   As evidenced by: Patient will participate within aftercare plan AEB aftercare provider and housing plan at discharge being identified.   CSW will schedule walk-in appointment with [facility].  2. Goal (s): Patient will exhibit decreased depressive symptoms and suicidal ideations.    Met: Goal progressing   Target date: 3-5 days post admission date   As evidenced by: Patient will utilize self-rating of depression at 3 or below and demonstrate decreased signs of depression or be deemed stable for discharge by MD.   05/12/16: Pt is acute at this time. Cannot report a depression score at this time.  3. Goal(s): Patient will demonstrate decreased signs and symptoms of anxiety.   Met: Goal progressing   Target date: 3-5 days post admission date   As evidenced by: Patient will utilize self-rating of anxiety at 3 or below and demonstrated decreased signs of anxiety, or be deemed stable for discharge by MD   05/12/16: Pt is too acute this time; reports an anxiety score of 6 at this time.   4. Goal(s): Patient will demonstrate decreased signs of psychosis  * Met:  Goal progressing  * Target date: 3-5 days post admission date  * As evidenced by: Patient will demonstrate decreased frequency of AVH or return to baseline function   05/12/16: Pt experiencing psychosis at this time. Pt extremely paranoid at this time.    Attendees:  Patient: [name] Family:  Physician: Andrea Hernandez, MD    05/12/2016 10:30 AM  Nursing: Phyllis Cobb , RN     05/12/2016 10:30 AM  Clinical Social Worker:  R. , LCSWA  05/12/2016 10:30 AM    

## 2016-05-12 NOTE — Progress Notes (Signed)
Recreation Therapy Notes  Date: 08.04.17 Time: 1:00 pm Location: Craft Room  Group Topic: Coping Skills  Goal Area(s) Addresses:  Patient will participate in healthy coping skill. Patient will verbalize benefit of using art as a coping skill.  Behavioral Response: Did not attend  Intervention: Coloring  Activity: Patients were given coloring sheets to color and were instructed to think about the emotions they were feeling and what their mind was focused on.  Education: LRT educated patients on healthy coping skills.  Education Outcome: Patient did not attend group.   Clinical Observations/Feedback: Patient did not attend group.  Shardai Star M, LRT/CTRS 05/12/2016 2:06 PM 
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
## 2016-05-12 NOTE — Progress Notes (Signed)
May 12, 2016. Patient Identification: [name] MRN:  [id number] Date of Evaluation:  05/11/2016 Chief Complaint:  Schizophrenia Principal Diagnosis: Psychosis To [facility]:    25 year old male with history of schizophrenia presented to [facility] under IVC from [place]. He was taken to [facility] by his aunt for mental health evaluation. She reported to ER that the patient was voicing suicidal ideations as well as homicidal ideations.    Per assessment in ER: Patient was minimally responsive only answering some questions by nodding. He appeared to be responding to internal stimuli.   Per [facility]: patient would not speak or seem to understand the content of questions. Patient was unable to sign admission forms or any information presented to patient at the crisis center. Per [facility]: "Respondent is disorganized in his thoughts and would not give his name or DOB. He would not sign any forms and would not speak to any of the providers. Aunt reports that he made threatening statements to her and her children. "   Patient has been recently released from prison and has been residing with his Aunt. Per notes, patient has a history of Schizophrenia. UDS was negative for all substances.   Since he was transferred to our behavioral health unit the patient has remained mainly mute. He appears to have thought blocking. He is guarded and appears paranoid. Nurses tell me that the patient look at his food for a long period of time before he started eating.   He did not respond to any questions during the assessment.   Patient is not yet stable for discharge. We are recommending to extend her/his involuntary commitment for up to 30 days.  If for information is needed about this case please do not hesitated to contact me at [phone].   Sincerely,  Radene Journey M.D. [phone] [facility]/Behavioral health Unit

## 2016-05-13 ENCOUNTER — Inpatient Hospital Stay: Payer: No Typology Code available for payment source

## 2016-05-13 LAB — RPR: RPR Ser Ql: NONREACTIVE

## 2016-05-13 LAB — PROLACTIN: Prolactin: 30.5 ng/mL — ABNORMAL HIGH (ref 4.0–15.2)

## 2016-05-13 MED ORDER — OLANZAPINE 10 MG PO TABS
20.0000 mg | ORAL_TABLET | Freq: Every day | ORAL | Status: DC
  Administered 2016-05-13 – 2016-05-14 (×2): 20 mg via ORAL
  Filled 2016-05-13 (×2): qty 2

## 2016-05-13 NOTE — Progress Notes (Signed)
D:  Patient isolates to his room.  And sleeping majority of shift. First am sits up on side of bed without difficulty and states "I can't walk I need a wheelchair"  Patient drowsy.  Repeatedly states that "I need to get out of here"  Does not answer questions regarding SI/HI or AVH.  Affect flat and tense.  A:  Encouraged group attendance and to get out of bed and move around.  Scheduled medications given. Q 15 minute rounding done.  R: No group participation. Med compliant.  Safety maintained.

## 2016-05-13 NOTE — [facility] Group Notes (Addendum)
[facility] LCSW Group Therapy   05/13/2016  9:30am *Late entry  Type of Therapy: Group Therapy   Participation Level: Did Not Attend. Patient invited to participate but declined.    Keeya Dyckman F. Catarina Huntley, MSW, LCSWA, LCAS     

## 2016-05-13 NOTE — Progress Notes (Signed)
D:  Patient presents to nurses station inquiring about how long he has to be here.   States "All I do is stay in the bed and sleep"  Explained that he needed to start getting OOB and going to groups and interacting with peers.  Patient states "I can't be around these people, I don't know them and they might come tell someone I did something to them, I ain't stupid" tried to explain that the other patients would not bother him if he did not want to be bothered.  Patient insisted that he could not be around the other patient.  Continues to show paranoia.

## 2016-05-13 NOTE — Plan of Care (Signed)
Problem: Coping: Goal: Ability to cope will improve Outcome: Not Progressing Patient not able to cope due to unlearned coping skills [place] RN

## 2016-05-13 NOTE — [facility] Group Notes (Signed)
[facility] LCSW Group Therapy  05/13/2016 3:25 PM  Type of Therapy:  Group Therapy  Participation Level:  Pt did not attend group. CSW invited pt to group.    Summary of Progress/Problems: Self care: Patients discussed self care and how it impacts them. Patients were asked to define self care in their own words. They discussed what aspects in their lives has influenced their self care. Patients also discussed self care in the areas of self regulation/control, hygiene/appearance, sleep/relaxation, healthy leisure, healthy eating habits, exercise, inner peace/spirituality, self improvement, sobriety, and health management. They were challenged to identify changes that are needed in order to improve self care.  Butler Vegh G. Garnette Czech MSW, LCSWA 05/13/2016, 3:26 PM

## 2016-05-13 NOTE — Progress Notes (Signed)
D: Patient is alert and oriented on the unit this shift. Patient not participated in groups today. Patient denies suicidal ideation, homicidal ideation, auditory or visual hallucinations at the present time.  A: Scheduled medications are administered to patient as per MD orders. Emotional support and encouragement are provided. Patient is maintained on q.15 minute safety checks. Patient is informed to notify staff with questions or concerns. R: No adverse medication reactions are noted. Patient is cooperative with medication administration  . Patient is receptive,  Paranoid and cooperative on the unit at this time. Patient  Isolative  on the unit this shift. Patient contracts for safety at this time. Patient remains safe at this time.
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
## 2016-05-14 NOTE — Plan of Care (Signed)
Problem: Coping: Goal: Ability to cope will improve Outcome: Not Progressing Patient continues to isolate himself and refuses to attend group.

## 2016-05-14 NOTE — Plan of Care (Signed)
Problem: Coping: Goal: Ability to cope will improve Outcome: Not Progressing Pt not able to cope with feelings at this time [facility]

## 2016-05-14 NOTE — Plan of Care (Signed)
Problem: Activity: Goal: Interest or engagement in leisure activities will improve Outcome: Not Progressing Pt isolates to room and has minimal interaction with staff or peers

## 2016-05-14 NOTE — Progress Notes (Signed)
D: Patient is alert and oriented on the unit this shift. Patient not  tattended   in groups today. Patient denies suicidal ideation, homicidal ideation, auditory or visual hallucinations at the present time.  A: Scheduled medications are administered to patient as per MD orders. Emotional support and encouragement are provided. Patient is maintained on q.15 minute safety checks. Patient is informed to notify staff with questions or concerns. R: No adverse medication reactions are noted. Patient is cooperative with medication administration  . Patient is preoccupied,isolative,paranoid non receptive, and cooperative on the unit at this time. Patient I does  not nteracts   with others on the unit this shift. Patient contracts for safety at this time. Patient remains safe at this time.
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
## 2016-05-14 NOTE — [facility] Group Notes (Signed)
[facility] Group Notes:  (Nursing/MHT/Case Management/Adjunct)  Date:  05/14/2016  Time:  11:03 PM  Type of Therapy:  Group Therapy  Participation Level:  Did Not Attend    [name] 05/14/2016, 11:03 PM

## 2016-05-14 NOTE — [facility] Group Notes (Signed)
[facility] LCSW Group Therapy  05/14/2016 2:21 PM  Type of Therapy:  Group Therapy  Participation Level:  Pt did not attend group. CSW invited pt to group.   Summary of Progress/Problems: Emotional Regulation: Patients will identify both negative and positive emotions. They will discuss emotions they have difficulty regulating and how they impact their lives. Patients will be asked to identify healthy coping skills to combat unhealthy reactions to negative emotions. The group focused on the anger and how to manage anger appropriately.   Kialee Kham G. Garnette CzechSampson MSW, LCSWA 05/14/2016, 2:22 PM

## 2016-05-14 NOTE — [facility] Group Notes (Signed)
[facility] Group Notes:  (Nursing/MHT/Case Management/Adjunct)  Date:  05/14/2016  Time:  9:37 AM  Type of Therapy:  Goal setting   Participation Level:  Did Not Attend  Twanna Hymanda C Lavin Petteway 05/14/2016, 9:37 AM

## 2016-05-14 NOTE — Progress Notes (Signed)
Pt denies any SI/HI/AVH. Isolative.  Disorganized. Tangential.  Did not attend evening group. Pt only came out of room to eat snack. Pt stated to staff that he was "high as a MF" Inappropriate laughter noted. Medication compliant, but suspicious. Voices no additional concerns at this time. Safety maintained.

## 2016-05-14 NOTE — Progress Notes (Signed)
D:  Patient continues to isolate himself to his room.  Patient states," I did something very bad and I know that I am going to have to answer for it on the street".  Patient does not want to attend groups or eat in the dayroom.  Patient states," I am scared someone will recognize me and let people know that I am here." A:  Patient encouraged to go to group to learn more coping skills and how to actively use those skills.  Patient offered encouragement to come out of his room and participate in unit activities. R:  Patient came out of his room to eat lunch.  Patient came out of his room once to get medications.  Patient still continues to show signs of paranoia.

## 2016-05-15 DIAGNOSIS — F2081 Schizophreniform disorder: Principal | ICD-10-CM

## 2016-05-15 MED ORDER — OLANZAPINE 10 MG PO TABS
30.0000 mg | ORAL_TABLET | Freq: Every day | ORAL | Status: DC
Start: 2016-05-15 — End: 2016-05-16
  Administered 2016-05-15: 30 mg via ORAL
  Filled 2016-05-15: qty 3

## 2016-05-15 MED ORDER — VITAMIN B-12 1000 MCG PO TABS
1000.0000 ug | ORAL_TABLET | Freq: Every day | ORAL | Status: DC
  Administered 2016-05-18 – 2016-05-23 (×6): 1000 ug via ORAL
  Filled 2016-05-15 (×6): qty 1

## 2016-05-15 MED ORDER — CYANOCOBALAMIN 1000 MCG/ML IJ SOLN
1000.0000 ug | Freq: Every day | INTRAMUSCULAR | Status: AC
  Administered 2016-05-15 – 2016-05-17 (×3): 1000 ug via INTRAMUSCULAR
  Filled 2016-05-15 (×3): qty 1

## 2016-05-15 NOTE — Progress Notes (Signed)
[facility] MD Progress Note  05/15/2016 1:38 PM Orvel Cutsforth  MRN:  [id number]  Subjective:  Mr. Ausmus is crying throughout the interview. He is unable to provide any information except to tell me that she is hungry and he needs clothes. Per social worker report the patient used to receive disability but lost it while in prison. He is trying to get it back. Apparently there are legal charges pending including assault on a male and the patient will likely go to jail. He does not participate in programming. He takes medications as prescribed. There are no behavioral problems.  Principal Problem: Schizophreniform disorder (HCC) Diagnosis:   Patient Active Problem List   Diagnosis Date Noted  . 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 Cannabis use disorder, moderate, dependence (HCC) [F12.20] 05/10/2016   Total Time spent with patient: 20 minutes  Past Psychiatric History: Psychosis.  Past Medical History:  Past Medical History:  Diagnosis Date  . Depression     Past Surgical History:  Procedure Laterality Date  . BACK SURGERY     Family History: History reviewed. No pertinent family history. Family Psychiatric  History: See H&P. Social History:  History  Alcohol Use No     History  Drug Use  . Types: Marijuana    Comment: rare    Social History   Social History  . Marital status: Single    Spouse name: N/A  . Number of children: N/A  . Years of education: N/A   Social History Main Topics  . Smoking status: Current Some Day Smoker  . Smokeless tobacco: Never Used  . Alcohol use No  . Drug use:     Types: Marijuana     Comment: rare  . Sexual activity: Yes    Birth control/ protection: Condom   Other Topics Concern  . None   Social History Narrative  . None   Additional Social History:    History of alcohol / drug use?: No history of alcohol / drug abuse Negative Consequences of Use: Personal relationships Name of Substance 1: marijuana daily 1 -  Age of First Use: unk 1 - Amount (size/oz): unk 1 - Frequency: unk 1 - Duration: unk 1 - Last Use / Amount: unk                  Sleep: Fair  Appetite:  Fair  Current Medications: Current Facility-Administered Medications  Medication Dose Route Frequency Provider Last Rate Last Dose  . acetaminophen (TYLENOL) tablet 650 mg  650 mg Oral Q6H PRN Jimmy Footman, MD      . alum & mag hydroxide-simeth (MAALOX/MYLANTA) 200-200-20 MG/5ML suspension 30 mL  30 mL Oral Q4H PRN Jimmy Footman, MD      . LORazepam (ATIVAN) tablet 0.5 mg  0.5 mg Oral TID Jimmy Footman, MD   0.5 mg at 05/15/16 1158  . magnesium hydroxide (MILK OF MAGNESIA) suspension 30 mL  30 mL Oral Daily PRN Jimmy Footman, MD      . OLANZapine (ZYPREXA) tablet 20 mg  20 mg Oral QHS Darliss Ridgel, MD   20 mg at 05/14/16 2107    Lab Results: No results found for this or any previous visit (from the past 48 hour(s)).  Blood Alcohol level:  Lab Results  Component Value Date   [facility] <5 05/09/2016   ETH <5 04/28/2016    Metabolic Disorder Labs: Lab Results  Component Value Date   HGBA1C 5.2 05/12/2016   Lab Results  Component Value  Date   PROLACTIN 30.5 (H) 05/12/2016   Lab Results  Component Value Date   CHOL 176 05/12/2016   TRIG 312 (H) 05/12/2016   HDL 55 05/12/2016   CHOLHDL 3.2 05/12/2016   VLDL 62 (H) 05/12/2016   LDLCALC 59 05/12/2016    Physical Findings: AIMS: Facial and Oral Movements Muscles of Facial Expression: None, normal Lips and Perioral Area: None, normal Jaw: None, normal Tongue: None, normal,Extremity Movements Upper (arms, wrists, hands, fingers): None, normal Lower (legs, knees, ankles, toes): None, normal, Trunk Movements Neck, shoulders, hips: None, normal, Overall Severity Severity of abnormal movements (highest score from questions above): None, normal Incapacitation due to abnormal movements: None, normal Patient's awareness of  abnormal movements (rate only patient's report): No Awareness, Dental Status Current problems with teeth and/or dentures?: No Does patient usually wear dentures?: No  CIWA:  CIWA-Ar Total: 0 COWS:  COWS Total Score: 0  Musculoskeletal: Strength & Muscle Tone: within normal limits Gait & Station: normal Patient leans: N/A  Psychiatric Specialty Exam: Physical Exam  Nursing note and vitals reviewed.   Review of Systems  Psychiatric/Behavioral: Positive for hallucinations and substance abuse.  All other systems reviewed and are negative.   Blood pressure 117/75, pulse 74, temperature 98.2 F (36.8 C), resp. rate 18, height 5\' 5"  (1.651 m), weight 72.8 kg (160 lb 8 oz), SpO2 100 %.Body mass index is 26.71 kg/m.  General Appearance: Casual  Eye Contact:  Good  Speech:  Clear and Coherent  Volume:  Normal  Mood:  Anxious  Affect:  Appropriate  Thought Process:  Disorganized  Orientation:  Full (Time, Place, and Person)  Thought Content:  Delusions, Hallucinations: Auditory and Paranoid Ideation  Suicidal Thoughts:  No  Homicidal Thoughts:  No  Memory:  Immediate;   Fair Recent;   Fair Remote;   Fair  Judgement:  Impaired  Insight:  Lacking  Psychomotor Activity:  Normal  Concentration:  Concentration: Fair and Attention Span: Fair  Recall:  [employer]  Fund of Knowledge:  Fair  Language:  Fair  Akathisia:  No  Handed:  Right  AIMS (if indicated):     Assets:  Communication Skills Desire for Improvement Physical Health Resilience Social Support  ADL's:  Intact  Cognition:  WNL  Sleep:  Number of Hours: 7     Treatment Plan Summary: Daily contact with patient to assess and evaluate symptoms and progress in treatment and Medication management   [name] is a 25 year old male with a history of psychosis admitted for first psychotic break.   1. Schizophreniform disorder. The patient was started on olanzapine for psychosis.Will increase to 30 mg.  2, Catatonia.  Resolved. This was addressed with Ativan.  3. Substance abuse. The patient smokes marijuana. He minimizes his problems and is not interested in treatment.   4. Metabolic syndrome monitoring. Lipid profile, TSH and HgbA1C were normal. Prolactin 30.5.   5. B12 defficiency. Vit B12 levwl is low. Will start supplementation.   6. STDs. HIV and RPR are negative.   7. Head Ct scan is unremarkable.   8. Disposition. He will be discharged to home with family. He will follow up with [facility].     Kristine LineaJolanta Mattheu Brodersen, MD 05/15/2016, 1:38 PM

## 2016-05-15 NOTE — BHH Counselor (Signed)
Adult Comprehensive Assessment  Patient ID: [name], male   DOB: 1991-08-08, 25 y.o.   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  Family Relationships: No stressors identified  Surveyor, quantity / Lack of resources (include bankruptcy): No income  Housing / Lack of housing: No stressors identified  Physical health (include injuries & life threatening diseases): No stressors identified Social relationships: No stressors identified  Substance abuse: Marijuana use Bereavement / Loss: No stressors identified   Living/Environment/Situation:  Living Arrangements: Other relatives Living conditions (as described by patient or guardian): "It was alright" How long has patient lived in current situation?: Unknown What is atmosphere in current home: Comfortable, Supportive  Family History:  Marital status: Single Are you sexually active?: No What is your sexual orientation?: Straight  Has your sexual activity been affected by drugs, alcohol, medication, or emotional stress?: N/A Does patient have children?: No  Childhood History:  By whom was/is the patient raised?: Grandparents Description of patient's relationship with caregiver when they were a child: "I don't know" Patient's description of current relationship with people who raised him/her: "I don't know" How were you disciplined when you got in trouble as a child/adolescent?: "Man I guess punishment" Does patient have siblings?: Yes Number of Siblings: 1 Description of patient's current relationship with siblings: Does not talk to brother Did patient suffer any verbal/emotional/physical/sexual abuse as a child?: No Did patient suffer from severe childhood neglect?: No Has patient ever been sexually abused/assaulted/raped as an adolescent or adult?: No Was the patient ever a victim  of a crime or a disaster?: No Witnessed domestic violence?: No Has patient been effected by domestic violence as an adult?: No  Education:  Highest grade of school patient has completed: 9th grade Currently a student?: No Name of school: n/a  Employment/Work Situation:   Employment situation: Unemployed Patient's job has been impacted by current illness: Yes 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 What is the longest time patient has a held a job?: "For a few" Where was the patient employed at that time?: [employer] Has patient ever been in the [employer]?: No Has patient ever served in combat?: No Did You Receive Any Psychiatric Treatment/Services While in Equities trader?: No Are There Guns or Other Weapons in Your Home?: No Are These Weapons Safely Secured?: Yes (N/A) Who Could Verify You Are Able To Have These Secured:: N/A  Financial Resources:      Alcohol/Substance Abuse:   What has been your use of drugs/alcohol within the last 12 months?: Marijuana If attempted suicide, did drugs/alcohol play a role in this?: No Alcohol/Substance Abuse Treatment Hx: Denies past history  Social Support System:   Forensic psychologist System: Poor Describe Community Support System: "No one wants to help me - I cannot worry about them, only can worry about myself" Type of faith/religion: "I dont know" How does patient's faith help to cope with current illness?: Unknown  Leisure/Recreation:   Leisure and Hobbies: "I don't do nothing man"  Strengths/Needs:   What things does the patient do well?: Unknown In what areas does patient struggle / problems for patient: Trying to get money; keeping stable employment   Discharge Plan:   Does patient have access to transportation?: Yes Will patient be returning to same living situation after discharge?: Yes Currently receiving community mental health services: Yes (From Whom) Does patient have  financial barriers  related to discharge medications?: No ([facility] )  Summary/Recommendations:   Summary and Recommendations (to be completed by the evaluator): Patient presented to the hospital involuntarily due to psychosis. Patient is a 25 year old man with a diagnosis of unspecified schizophrenia spectrum disorder. Pt denies SI/HI at this time. Pt reports primary triggers for admission was not having money and not receiving the help that needs. Patient is unable to verbalize his concerns at this time. [place] aunt. Patient will benefit from crisis stabilization, medication evaluation, group therapy, and psycho education in addition to case management for discharge planning. Patient and CSW reviewed pt's identified goals and treatment plan. Pt verbalized understanding and agreed to treatment plan.  At discharge it is recommended that patient remain compliant with established plan and continue treatment.  Lynden OxfordKadijah R Talitha Warner, MSW, LCSW-A  05/15/2016

## 2016-05-15 NOTE — Plan of Care (Signed)
Problem: Coping: Goal: Ability to verbalize feelings will improve Outcome: Not Progressing Pt preoccupied with thoughts of needing food, clothes, money and being "afraid of people." Isolative. Did report to one meal in dayroom, refuses group. Encouraged group participation and meals in dayroom with peers. Will continue to monitor.

## 2016-05-15 NOTE — Progress Notes (Signed)
D: Alert and oriented today. Refused to eat breakfast in dayroom with peers. After much encouragement, pt did report to dayroom for lunch. Denies SI/HI/AVH at this time. Continuously reports he is "afriad of people" and that he is in need of "money, clothes, food." Showered.   A: Provided support and encouragement. Administered medications as ordered. Every 15 minute checks to maintain safety. Informed to contact nurse with needs.   R: Medication compliant. Paranoid, isolative, preoccupied. No interaction with peers. No group attendance. One meal in day room. Safety maintained.

## 2016-05-15 NOTE — Progress Notes (Signed)
Recreation Therapy Notes  Date: 08.07.17 Time: 1:00 pm Location: Craft Room  Group Topic: Self-expression  Goal Area(s) Addresses:  Patient will be able to identify a color that represents each emotion. Patient will verbalize benefit of using art as a means of self-expression. Patient will verbalize one emotion experienced while participating in activity.  Behavioral Response: Did not attend  Intervention: The Colors Within Me  Activity: Patients were given blank face worksheets and instructed to pick a color for each emotion they were experiencing and show on the face how much of that emotion they were experiencing.  Education: LRT educated patients on other forms of self-expression.  Education Outcome: Patient did not attend group.  Clinical Observations/Feedback: Patient did not attend group.  Bucky Grigg M, LRT/CTRS 05/15/2016 3:19 PM 

## 2016-05-16 MED ORDER — OLANZAPINE 10 MG PO TABS
30.0000 mg | ORAL_TABLET | Freq: Once | ORAL | Status: AC
  Administered 2016-05-16: 30 mg via ORAL
  Filled 2016-05-16: qty 3

## 2016-05-16 MED ORDER — OLANZAPINE 10 MG PO TABS
20.0000 mg | ORAL_TABLET | Freq: Every day | ORAL | Status: DC
  Administered 2016-05-17 – 2016-05-22 (×6): 20 mg via ORAL
  Filled 2016-05-16 (×7): qty 2

## 2016-05-16 MED ORDER — OLANZAPINE 10 MG PO TABS
10.0000 mg | ORAL_TABLET | Freq: Every day | ORAL | Status: DC
Start: 2016-05-17 — End: 2016-05-21
  Administered 2016-05-17 – 2016-05-21 (×5): 10 mg via ORAL
  Filled 2016-05-16 (×5): qty 1

## 2016-05-16 NOTE — Progress Notes (Signed)
D: Pt continues to be isolative to room except at mealtime. Continues to report need of money, clothes, food. Appears to be paranoid and disorganized. Will not engage in further conversation other than his needs. Denies SI/HI/AVH at this time.   A: Provided support an encouragement. Administered medications as ordered. Every 15 minute checks for safety. Informed to contact nurse with needs.  R: Medication compliant. Paranoid, isolative, preoccupied, but cooperative. No group attendance. Meals in dayroom. Safety maintained.

## 2016-05-16 NOTE — Progress Notes (Signed)
Recreation Therapy Notes  Date: 08.08.17 Time: 3:00 pm Location: Craft Room  Group Topic: Goal Setting  Goal Area(s) Addresses:  Patient will write at least one goal. Patient will write at least one obstacle.  Behavioral Response: Did not attend  Intervention: Recovery Goal Chart  Activity: Patients were instructed to make a Recovery Goal Chart including goals, obstacles, the date they started working on their goals, and the date they achieved their goals.  Education: LRT educated patients on healthy ways to celebrate reaching their goals.  Education Outcome: Patient did not attend group.  Clinical Observations/Feedback: Patient did not attend group.  Jacquelynn CreeGreene,Abygail Galeno M, LRT/CTRS 05/16/2016 3:59 PM

## 2016-05-16 NOTE — Progress Notes (Signed)
[facility] MD Progress Note  05/16/2016 6:05 PM Meer Reindl  MRN:  [id number]  Subjective:  There is no change in Mr. Faubert's behavior. He is still crying throughout the interview. He is unable to provide any information except to tell me that she is hungry and he needs clothes. Per social worker report the patient used to receive disability but lost it while in prison. He is trying to get it back. Apparently there are legal charges pending including assault on a male and the patient will likely go to jail. He does not participate in programming. He takes medications as prescribed. There are no behavioral problems.  SW spoke with his aunt who helped him apply for diasability and will be here tomorrow to help him with SSD interview.  Principal Problem: Schizophreniform disorder (HCC) Diagnosis:   Patient Active Problem List   Diagnosis Date Noted  . 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 Cannabis use disorder, moderate, dependence (HCC) [F12.20] 05/10/2016   Total Time spent with patient: 20 minutes  Past Psychiatric History: Psychosis.  Past Medical History:  Past Medical History:  Diagnosis Date  . Depression     Past Surgical History:  Procedure Laterality Date  . BACK SURGERY     Family History: History reviewed. No pertinent family history. Family Psychiatric  History: See H&P. Social History:  History  Alcohol Use No     History  Drug Use  . Types: Marijuana    Comment: rare    Social History   Social History  . Marital status: Single    Spouse name: N/A  . Number of children: N/A  . Years of education: N/A   Social History Main Topics  . Smoking status: Current Some Day Smoker  . Smokeless tobacco: Never Used  . Alcohol use No  . Drug use:     Types: Marijuana     Comment: rare  . Sexual activity: Yes    Birth control/ protection: Condom   Other Topics Concern  . None   Social History Narrative  . None   Additional Social History:     History of alcohol / drug use?: No history of alcohol / drug abuse Negative Consequences of Use: Personal relationships Name of Substance 1: marijuana daily 1 - Age of First Use: unk 1 - Amount (size/oz): unk 1 - Frequency: unk 1 - Duration: unk 1 - Last Use / Amount: unk                  Sleep: Fair  Appetite:  Fair  Current Medications: Current Facility-Administered Medications  Medication Dose Route Frequency Provider Last Rate Last Dose  . acetaminophen (TYLENOL) tablet 650 mg  650 mg Oral Q6H PRN Jimmy Footman, MD      . alum & mag hydroxide-simeth (MAALOX/MYLANTA) 200-200-20 MG/5ML suspension 30 mL  30 mL Oral Q4H PRN Jimmy Footman, MD      . cyanocobalamin ((VITAMIN B-12)) injection 1,000 mcg  1,000 mcg Intramuscular Daily Shari Prows, MD   1,000 mcg at 05/16/16 0957  . magnesium hydroxide (MILK OF MAGNESIA) suspension 30 mL  30 mL Oral Daily PRN Jimmy Footman, MD      . OLANZapine (ZYPREXA) tablet 30 mg  30 mg Oral QHS Shari Prows, MD   30 mg at 05/15/16 2218  . [START ON 05/18/2016] vitamin B-12 (CYANOCOBALAMIN) tablet 1,000 mcg  1,000 mcg Oral Daily Shari Prows, MD        Lab Results: No results  found for this or any previous visit (from the past 48 hour(s)).  Blood Alcohol level:  Lab Results  Component Value Date   [facility] <5 05/09/2016   ETH <5 04/28/2016    Metabolic Disorder Labs: Lab Results  Component Value Date   HGBA1C 5.2 05/12/2016   Lab Results  Component Value Date   PROLACTIN 30.5 (H) 05/12/2016   Lab Results  Component Value Date   CHOL 176 05/12/2016   TRIG 312 (H) 05/12/2016   HDL 55 05/12/2016   CHOLHDL 3.2 05/12/2016   VLDL 62 (H) 05/12/2016   LDLCALC 59 05/12/2016    Physical Findings: AIMS: Facial and Oral Movements Muscles of Facial Expression: None, normal Lips and Perioral Area: None, normal Jaw: None, normal Tongue: None, normal,Extremity Movements Upper  (arms, wrists, hands, fingers): None, normal Lower (legs, knees, ankles, toes): None, normal, Trunk Movements Neck, shoulders, hips: None, normal, Overall Severity Severity of abnormal movements (highest score from questions above): None, normal Incapacitation due to abnormal movements: None, normal Patient's awareness of abnormal movements (rate only patient's report): No Awareness, Dental Status Current problems with teeth and/or dentures?: No Does patient usually wear dentures?: No  CIWA:  CIWA-Ar Total: 0 COWS:  COWS Total Score: 0  Musculoskeletal: Strength & Muscle Tone: within normal limits Gait & Station: normal Patient leans: N/A  Psychiatric Specialty Exam: Physical Exam  Nursing note and vitals reviewed.   Review of Systems  Psychiatric/Behavioral: Positive for hallucinations and substance abuse.  All other systems reviewed and are negative.   Blood pressure (!) 147/72, pulse 64, temperature 98 F (36.7 C), resp. rate 18, height 5\' 5"  (1.651 m), weight 72.8 kg (160 lb 8 oz), SpO2 100 %.Body mass index is 26.71 kg/m.  General Appearance: Casual  Eye Contact:  Good  Speech:  Clear and Coherent  Volume:  Normal  Mood:  Anxious  Affect:  Appropriate  Thought Process:  Disorganized  Orientation:  Full (Time, Place, and Person)  Thought Content:  Delusions, Hallucinations: Auditory and Paranoid Ideation  Suicidal Thoughts:  No  Homicidal Thoughts:  No  Memory:  Immediate;   Fair Recent;   Fair Remote;   Fair  Judgement:  Impaired  Insight:  Lacking  Psychomotor Activity:  Normal  Concentration:  Concentration: Fair and Attention Span: Fair  Recall:  [employer] of Knowledge:  Fair  Language:  Fair  Akathisia:  No  Handed:  Right  AIMS (if indicated):     Assets:  Communication Skills Desire for Improvement Physical Health Resilience Social Support  ADL's:  Intact  Cognition:  WNL  Sleep:  Number of Hours: 6.5     Treatment Plan Summary: Daily contact  with patient to assess and evaluate symptoms and progress in treatment and Medication management   [name] is a 25 year old male with a history of psychosis admitted for first psychotic break.   1. Schizophreniform disorder. The patient was started on olanzapine for psychosis.Will gine 10 mg in the morning and 20 mg in the evening.   2, Catatonia. Resolved. This was addressed with Ativan.  3. Substance abuse. The patient smokes marijuana. He minimizes his problems and is not interested in treatment.   4. Metabolic syndrome monitoring. Lipid profile, TSH and HgbA1C were normal. Prolactin 30.5.   5. B12 defficiency. Vit B12 levwl is low. Will start supplementation.   6. STDs. HIV and RPR are negative.   7. Head Ct scan is unremarkable.   8. Disposition. He will be discharged  to home with family. He will follow up with [facility].     Kristine LineaJolanta Emmalyne Giacomo, MD 05/16/2016, 6:05 PM

## 2016-05-16 NOTE — Plan of Care (Signed)
Problem: Safety: Goal: Ability to remain free from injury will improve Outcome: Progressing Patient has remained free of injury during this shift.    

## 2016-05-16 NOTE — Plan of Care (Signed)
Problem: Nutrition: Goal: Adequate nutrition will be maintained Outcome: Progressing Order for double portions at mealtime placed yesterday. Pt reports of being hungry have decreased. Pt does report to dayroom for meals. Will continue to monitor and encourage mealtime in dayroom.

## 2016-05-16 NOTE — Progress Notes (Signed)
D: Patient has been isolative to his room. When assessing the patient he stated he wanted a medication that would help him know what was real and then when on to state he didn't know if I was real. He denies SI/HI/AVH. Denies pain.  A: Medication given with education. Encouragement provided.  R: Patient was compliant with medication. He remains calm and cooperative. Safety maintained with 15 min checks.

## 2016-05-16 NOTE — Social Work (Signed)
CSW spoke with pt's aunt, [name] who stated that pt has an appointment for SSI tomorrow (05/17/16). [name] [facility]. [name] approved of meeting. Will meet tomorrow at 10:30am for SSI appointment.   [name], MSW, LCSW-A 05/16/16   11:27AM

## 2016-05-17 MED ORDER — HALOPERIDOL 5 MG PO TABS
5.0000 mg | ORAL_TABLET | Freq: Two times a day (BID) | ORAL | Status: DC
  Administered 2016-05-17 – 2016-05-23 (×13): 5 mg via ORAL
  Filled 2016-05-17 (×13): qty 1

## 2016-05-17 NOTE — Progress Notes (Signed)
[facility] MD Progress Note  05/17/2016 7:54 AM [name]  MRN:  [id number]  Subjective:  [name] is very paranoid and afraid on the unit. He gets out of his room for meals only. He has double portions and paranoid tray. He came to my office for an interview for the first time today. He was unable to say much but has been attending to internal stimuli. He was unable to disclose the content. He accepts medications. No somatic complains. He has not been able to participate in programming.  Principal Problem: Schizophreniform disorder (HCC) Diagnosis:   Patient Active Problem List   Diagnosis Date Noted  . 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 Cannabis use disorder, moderate, dependence (HCC) [F12.20] 05/10/2016   Total Time spent with patient: 20 minutes  Past Psychiatric History: psychosis.  Past Medical History:  Past Medical History:  Diagnosis Date  . Depression     Past Surgical History:  Procedure Laterality Date  . BACK SURGERY     Family History: History reviewed. No pertinent family history. Family Psychiatric  History: see H&P. Social History:  History  Alcohol Use No     History  Drug Use  . Types: Marijuana    Comment: rare    Social History   Social History  . Marital status: Single    Spouse name: N/A  . Number of children: N/A  . Years of education: N/A   Social History Main Topics  . Smoking status: Current Some Day Smoker  . Smokeless tobacco: Never Used  . Alcohol use No  . Drug use:     Types: Marijuana     Comment: rare  . Sexual activity: Yes    Birth control/ protection: Condom   Other Topics Concern  . None   Social History Narrative  . None   Additional Social History:    History of alcohol / drug use?: No history of alcohol / drug abuse Negative Consequences of Use: Personal relationships Name of Substance 1: marijuana daily 1 - Age of First Use: unk 1 - Amount (size/oz): unk 1 - Frequency: unk 1 - Duration:  unk 1 - Last Use / Amount: unk                  Sleep: Fair  Appetite:  Fair  Current Medications: Current Facility-Administered Medications  Medication Dose Route Frequency Provider Last Rate Last Dose  . acetaminophen (TYLENOL) tablet 650 mg  650 mg Oral Q6H PRN Jimmy Footman, MD      . alum & mag hydroxide-simeth (MAALOX/MYLANTA) 200-200-20 MG/5ML suspension 30 mL  30 mL Oral Q4H PRN Jimmy Footman, MD      . cyanocobalamin ((VITAMIN B-12)) injection 1,000 mcg  1,000 mcg Intramuscular Daily Shari Prows, MD   1,000 mcg at 05/16/16 0957  . haloperidol (HALDOL) tablet 5 mg  5 mg Oral BID Anurag Scarfo B Wrangler Penning, MD      . magnesium hydroxide (MILK OF MAGNESIA) suspension 30 mL  30 mL Oral Daily PRN Jimmy Footman, MD      . OLANZapine (ZYPREXA) tablet 10 mg  10 mg Oral Q breakfast Johnathan Tortorelli B Mirage Pfefferkorn, MD      . OLANZapine (ZYPREXA) tablet 20 mg  20 mg Oral QHS Raffaella Edison B Rennie Rouch, MD      . Melene Muller ON 05/18/2016] vitamin B-12 (CYANOCOBALAMIN) tablet 1,000 mcg  1,000 mcg Oral Daily Tonya Carlile B Amariah Kierstead, MD        Lab Results: No results found for this  or any previous visit (from the past 48 hour(s)).  Blood Alcohol level:  Lab Results  Component Value Date   [facility] <5 05/09/2016   ETH <5 04/28/2016    Metabolic Disorder Labs: Lab Results  Component Value Date   HGBA1C 5.2 05/12/2016   Lab Results  Component Value Date   PROLACTIN 30.5 (H) 05/12/2016   Lab Results  Component Value Date   CHOL 176 05/12/2016   TRIG 312 (H) 05/12/2016   HDL 55 05/12/2016   CHOLHDL 3.2 05/12/2016   VLDL 62 (H) 05/12/2016   LDLCALC 59 05/12/2016    Physical Findings: AIMS: Facial and Oral Movements Muscles of Facial Expression: None, normal Lips and Perioral Area: None, normal Jaw: None, normal Tongue: None, normal,Extremity Movements Upper (arms, wrists, hands, fingers): None, normal Lower (legs, knees, ankles, toes): None, normal, Trunk  Movements Neck, shoulders, hips: None, normal, Overall Severity Severity of abnormal movements (highest score from questions above): None, normal Incapacitation due to abnormal movements: None, normal Patient's awareness of abnormal movements (rate only patient's report): No Awareness, Dental Status Current problems with teeth and/or dentures?: No Does patient usually wear dentures?: No  CIWA:  CIWA-Ar Total: 0 COWS:  COWS Total Score: 0  Musculoskeletal: Strength & Muscle Tone: within normal limits Gait & Station: normal Patient leans: N/A  Psychiatric Specialty Exam: Physical Exam  Nursing note and vitals reviewed.   Review of Systems  Psychiatric/Behavioral: Positive for hallucinations.  All other systems reviewed and are negative.   Blood pressure (!) 134/111, pulse 75, temperature 98.5 F (36.9 C), temperature source Oral, resp. rate 20, height  (1.651 m), weight 72.8 kg (160 lb 8 oz), SpO2 100 %.Body mass index is 26.71 kg/m.  General Appearance: Disheveled  Eye Contact:  Poor  Speech:  Slow  Volume:  Decreased  Mood:  Depressed and Hopeless  Affect:  Labile  Thought Process:  Disorganized  Orientation:  Full (Time, Place, and Person)  Thought Content:  Delusions and Paranoid Ideation  Suicidal Thoughts:  No  Homicidal Thoughts:  No  Memory:  Immediate;   Fair Recent;   Fair Remote;   Fair  Judgement:  Impaired  Insight:  Lacking  Psychomotor Activity:  Decreased  Concentration:  Concentration: Fair and Attention Span: Fair  Recall:  [employer] of Knowledge:  Fair  Language:  Fair  Akathisia:  No  Handed:  Right  AIMS (if indicated):     Assets:  Communication Skills Desire for Improvement Housing Physical Health Resilience Social Support  ADL's:  Intact  Cognition:  WNL  Sleep:  Number of Hours: 7.5     Treatment Plan Summary: Daily contact with patient to assess and evaluate symptoms and progress in treatment and Medication management    [name] is a 25 year old male with a history of psychosis admitted for first psychotic break.   1. Schizophreniform disorder. The patient was started on olanzapine for psychosis.Will gine 10 mg in the morning and 20 mg in the evening and added Haldol 5 mg bid.  2, Catatonia. Resolved. This was addressed with Ativan.  3. Substance abuse. The patient smokes marijuana. He minimizes his problems and is not interested in treatment.   4. Metabolic syndrome monitoring. Lipid profile, TSH and HgbA1C were normal. Prolactin 30.5.   5. B12 defficiency. Vit B12 levwl is low. Will start supplementation.   6. STDs. HIV and RPR are negative.   7. Head Ct scan is unremarkable.   8. Disposition. He will be  discharged to home with family. He will follow up with [facility].   Kristine LineaJolanta Jerred Zaremba, MD 05/17/2016, 7:54 AM

## 2016-05-17 NOTE — Progress Notes (Signed)
D: Patient appeared very sad during assessment. He has been isolative this evening. Denies SI/HI/AVH.  A: Patient had no evening medications. Encouragement to go to groups and interact on the unit was given.  R: Patient has been calm and cooperative. Safety maintained with 15 min checks.

## 2016-05-17 NOTE — Progress Notes (Signed)
Recreation Therapy Notes  Date: 08.09.17 Time: 9:30 am Location: Craft Room  Group Topic: Self-esteem  Goal Area(s) Addresses:  Patient will write at least one positive trait about self. Patient will verbalize benefit of having healthy self-esteem.  Behavioral Response: Did not attend  Intervention: I Am  Activity: Patients were given a worksheet with the letter I on it and instructed to write as many positive traits about themselves inside the letter.  Education: LRT educated patients on ways they can increase their self-esteem.  Education Outcome: Patient did not attend group.   Clinical Observations/Feedback: Patient did not attend group.  Monicia Tse M, LRT/CTRS 05/17/2016 10:29 AM 

## 2016-05-17 NOTE — Social Work (Signed)
CSW met with pt's aunt, [name] on the unit at her request to contact and have a conference call with [employer]. Unfortunately, Ms. Young from [employer] contacted the wrong phone number for [employer] and had to reschedule the appointment for August 25th at 1:30pm. CSW gave [name] follow-up information for [employer] and website information.    Emilie Rutter, MSW, LCSW-A 12:30 PM  05/17/16

## 2016-05-17 NOTE — Tx Team (Signed)
Interdisciplinary Treatment Plan Update (Adult)         Date: 05/17/2016   Time Reviewed: 10:30 AM   Progress in Treatment: Improving Attending groups: Yes  Participating in groups: Yes  Taking medication as prescribed: Yes  Tolerating medication: Yes  Family/Significant other contact made: CSW is still assessing proper contacts Patient understands diagnosis: Yes  Discussing patient identified problems/goals with staff: Yes  Medical problems stabilized or resolved: Yes  Denies suicidal/homicidal ideation: Yes  Issues/concerns per patient self-inventory: Yes  Other:   New problem(s) identified: N/A   Discharge Plan or Barriers: see below   Reason for Continuation of Hospitalization:   Depression   Anxiety   Medication Stabilization   Comments: N/A   Estimated length of stay: 5 days    Patient is a 25 year old  male admitted for psychosis. Patient lives in [place], [place]. 25 year old male with history of schizophrenia presented to [facility]  under  IVC  from [place]. He was taken to [facility] by his and for mental health help. She reported to ER that the patient was voicing suicidal ideations as well as homicidal ideations.  Patient will benefit from crisis stabilization, medication evaluation, group therapy, and psycho education in addition to case management for discharge planning. Patient and CSW reviewed pt's identified goals and treatment plan. Pt verbalized understanding and agreed to treatment plan.    Review of initial/current patient goals per problem list:  1. Goal(s): Patient will participate in aftercare plan   Met: Goal Progressing   Target date: 3-5 days post admission date   As evidenced by: Patient will participate within aftercare plan AEB aftercare provider and housing plan at discharge being identified.   CSW will schedule walk-in appointment with [facility] at discharge.  2. Goal (s): Patient will exhibit decreased depressive symptoms and suicidal  ideations.   Met: Goal progressing   Target date: 3-5 days post admission date   As evidenced by: Patient will utilize self-rating of depression at 3 or below and demonstrate decreased signs of depression or be deemed stable for discharge by MD.   05/12/16: Pt is acute at this time. Cannot report a depression score at this time. 05/17/16: Patient repeatedly stated that "he needs help" and "nothing is helping"   3. Goal(s): Patient will demonstrate decreased signs and symptoms of anxiety.   Met: Goal progressing   Target date: 3-5 days post admission date   As evidenced by: Patient will utilize self-rating of anxiety at 3 or below and demonstrated decreased signs of anxiety, or be deemed stable for discharge by MD   05/12/16: Pt is too acute this time; reports an anxiety score of 6 at this time. 05/17/16: Pt reports an anxiety score of 6 at this time. Pt is paranoid at this time - will only leave room for meals.   4. Goal(s): Patient will demonstrate decreased signs of psychosis  * Met:  Goal progressing  * Target date: 3-5 days post admission date  * As evidenced by: Patient will demonstrate decreased frequency of AVH or return to baseline function   05/12/16: Pt experiencing psychosis at this time. Pt extremely paranoid at this time. 05/17/16: Pt is still presenting with signs of psychosis at this time. Extremely paranoid and believes people are out to get him.   Attendees:  Patient: [name] Family:  Physician: Orson Slick, MD    05/17/2016 10:30 AM  Nursing: Polly Cobia , RN     05/17/2016 10:30 AM  Clinical Social  Worker: Emilie Rutter, LCSWA  05/17/2016 10:30 AM  Recreational Therapist: Everitt Amber                         05/17/2016 10:30 AM

## 2016-05-17 NOTE — Plan of Care (Signed)
Problem: Activity: Goal: Risk for activity intolerance will decrease Outcome: Not Progressing Patient has been isolative to room this evening.

## 2016-05-17 NOTE — Progress Notes (Signed)
Affect flat and sad.  Mumbles  Incoherently.  when asked if feels suicidal or if depressed. Isolates to room. Verbalizes that he is scared of the people here. Reassurance offered.  Support and encouragement offered.  Safety maintained.

## 2016-05-17 NOTE — Plan of Care (Signed)
Problem: Coping: Goal: Ability to verbalize feelings will improve Outcome: Progressing Able to verbalize that he is scared and he does not know what's going on and that he does not remember what was going on to bring him into the hospital

## 2016-05-18 NOTE — [facility] Group Notes (Signed)
[facility] LCSW Group Therapy  05/18/2016 4:48 PM  Type of Therapy:  Group Therapy  Participation Level:  Pt did not attend group. CSW invited pt to group.   Summary of Progress/Problems: Balance in life: Patients will discuss the concept of balance and how it looks and feels to be unbalanced. Pt will identify areas in their life that is unbalanced and ways to become more balanced.   Shailyn Weyandt G. Garnette CzechSampson MSW, LCSWA 05/18/2016, 4:49 PM

## 2016-05-18 NOTE — Progress Notes (Signed)
D: Pt denies SI/HI/AVH, but noted responding to internal stimuli. Patient is noted with confusion to place and situation, frequent redirection implemented for safety. Pt is cooperative with treatment plan. Pt appears, apprehensive and restless.   A: Pt was offered support and encouragement. Pt was given scheduled medications. Pt was encouraged to attend groups. Q 15 minute checks were done for safety.  R:Pt did not attend evening group. Pt is taking medication. Pt has no complaints.Pt receptive to treatment and safety maintained on unit.

## 2016-05-18 NOTE — Progress Notes (Signed)
[facility] MD Progress Note  05/18/2016 5:15 PM [name]  MRN:  [id number]  Subjective:  [name] continues to be very paranoid and afraid on the unit. He gets out of his room for meals only. He has double portions and paranoid tray. He came to my office for an interview for the first time today. He was unable to say much but has been attending to internal stimuli. He was unable to disclose the content. He accepts medications. No somatic complains. He has not been able to participate in programming.  Principal Problem: Schizophreniform disorder (HCC) Diagnosis:   Patient Active Problem List   Diagnosis Date Noted  . 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 Cannabis use disorder, moderate, dependence (HCC) [F12.20] 05/10/2016   Total Time spent with patient: 20 minutes  Past Psychiatric History: psychosis.  Past Medical History:  Past Medical History:  Diagnosis Date  . Depression     Past Surgical History:  Procedure Laterality Date  . BACK SURGERY     Family History: History reviewed. No pertinent family history. Family Psychiatric  History: see H&P. Social History:  History  Alcohol Use No     History  Drug Use  . Types: Marijuana    Comment: rare    Social History   Social History  . Marital status: Single    Spouse name: N/A  . Number of children: N/A  . Years of education: N/A   Social History Main Topics  . Smoking status: Current Some Day Smoker  . Smokeless tobacco: Never Used  . Alcohol use No  . Drug use:     Types: Marijuana     Comment: rare  . Sexual activity: Yes    Birth control/ protection: Condom   Other Topics Concern  . None   Social History Narrative  . None   Additional Social History:    History of alcohol / drug use?: No history of alcohol / drug abuse Negative Consequences of Use: Personal relationships Name of Substance 1: marijuana daily 1 - Age of First Use: unk 1 - Amount (size/oz): unk 1 - Frequency: unk 1  - Duration: unk 1 - Last Use / Amount: unk                  Sleep: Fair  Appetite:  Fair  Current Medications: Current Facility-Administered Medications  Medication Dose Route Frequency Provider Last Rate Last Dose  . acetaminophen (TYLENOL) tablet 650 mg  650 mg Oral Q6H PRN Jimmy Footman, MD      . alum & mag hydroxide-simeth (MAALOX/MYLANTA) 200-200-20 MG/5ML suspension 30 mL  30 mL Oral Q4H PRN Jimmy Footman, MD      . haloperidol (HALDOL) tablet 5 mg  5 mg Oral BID Shari Prows, MD   5 mg at 05/18/16 0839  . magnesium hydroxide (MILK OF MAGNESIA) suspension 30 mL  30 mL Oral Daily PRN Jimmy Footman, MD      . OLANZapine (ZYPREXA) tablet 10 mg  10 mg Oral Q breakfast Jolanta B Pucilowska, MD   10 mg at 05/18/16 0839  . OLANZapine (ZYPREXA) tablet 20 mg  20 mg Oral QHS Shari Prows, MD   20 mg at 05/17/16 2205  . vitamin B-12 (CYANOCOBALAMIN) tablet 1,000 mcg  1,000 mcg Oral Daily Shari Prows, MD   1,000 mcg at 05/18/16 4098    Lab Results: No results found for this or any previous visit (from the past 48 hour(s)).  Blood Alcohol level:  Lab Results  Component Value Date   ETH <5 05/09/2016   ETH <5 04/28/2016    Metabolic Disorder Labs: Lab Results  Component Value Date   HGBA1C 5.2 05/12/2016   Lab Results  Component Value Date   PROLACTIN 30.5 (H) 05/12/2016   Lab Results  Component Value Date   CHOL 176 05/12/2016   TRIG 312 (H) 05/12/2016   HDL 55 05/12/2016   CHOLHDL 3.2 05/12/2016   VLDL 62 (H) 05/12/2016   LDLCALC 59 05/12/2016    Physical Findings: AIMS: Facial and Oral Movements Muscles of Facial Expression: None, normal Lips and Perioral Area: None, normal Jaw: None, normal Tongue: None, normal,Extremity Movements Upper (arms, wrists, hands, fingers): None, normal Lower (legs, knees, ankles, toes): None, normal, Trunk Movements Neck, shoulders, hips: None, normal, Overall  Severity Severity of abnormal movements (highest score from questions above): None, normal Incapacitation due to abnormal movements: None, normal Patient's awareness of abnormal movements (rate only patient's report): No Awareness, Dental Status Current problems with teeth and/or dentures?: No Does patient usually wear dentures?: No  CIWA:  CIWA-Ar Total: 0 COWS:  COWS Total Score: 0  Musculoskeletal: Strength & Muscle Tone: within normal limits Gait & Station: normal Patient leans: N/A  Psychiatric Specialty Exam: Physical Exam  Nursing note and vitals reviewed.   Review of Systems  Psychiatric/Behavioral: Positive for hallucinations.  All other systems reviewed and are negative.   Blood pressure 128/86, pulse 88, temperature 98 F (36.7 C), temperature source Oral, resp. rate 20, height  (1.651 m), weight 72.8 kg (160 lb 8 oz), SpO2 100 %.Body mass index is 26.71 kg/m.  General Appearance: Disheveled  Eye Contact:  Poor  Speech:  Slow  Volume:  Decreased  Mood:  Depressed and Hopeless  Affect:  Labile  Thought Process:  Disorganized  Orientation:  Full (Time, Place, and Person)  Thought Content:  Delusions and Paranoid Ideation  Suicidal Thoughts:  No  Homicidal Thoughts:  No  Memory:  Immediate;   Fair Recent;   Fair Remote;   Fair  Judgement:  Impaired  Insight:  Lacking  Psychomotor Activity:  Decreased  Concentration:  Concentration: Fair and Attention Span: Fair  Recall:  [employer] of Knowledge:  Fair  Language:  Fair  Akathisia:  No  Handed:  Right  AIMS (if indicated):     Assets:  Communication Skills Desire for Improvement Housing Physical Health Resilience Social Support  ADL's:  Intact  Cognition:  WNL  Sleep:  Number of Hours: 7.75     Treatment Plan Summary: Daily contact with patient to assess and evaluate symptoms and progress in treatment and Medication management   [name] is a 25 year old male with a history of psychosis  admitted for first psychotic break.   1. Schizophreniform disorder. The patient was started on olanzapine for psychosis.Will gine 10 mg in the morning and 20 mg in the evening and added Haldol 5 mg bid.  2, Catatonia. Resolved. This was addressed with Ativan.  3. Substance abuse. The patient smokes marijuana. He minimizes his problems and is not interested in treatment.   4. Metabolic syndrome monitoring. Lipid profile, TSH and HgbA1C were normal. Prolactin 30.5.   5. B12 defficiency. Vit B12 levwl is low. Will start supplementation.   6. STDs. HIV and RPR are negative.   7. Head Ct scan is unremarkable.   8. Disposition. He will be discharged to home with family. He will follow up with [facility].   Jolanta Pucilowska,  MD 05/18/2016, 5:15 PM

## 2016-05-18 NOTE — Progress Notes (Signed)
Patient states "I am scared of people."Denies suicidal and homicidal ideations.Patient is positive for auditory hallucinations but he could not tell what it is.Attended some groups.Went outside for basketball.Encouraged patient for positive coping skills.Compliant with medications.

## 2016-05-18 NOTE — [facility] Group Notes (Signed)
[facility] Group Notes:  (Nursing/MHT/Case Management/Adjunct)  Date:  05/18/2016  Time:  4:39 AM  Type of Therapy:  Group Therapy  Participation Level:  Did Not Attend  [name] 05/18/2016, 4:39 AM

## 2016-05-18 NOTE — Progress Notes (Signed)
Recreation Therapy Notes  Date: 08.10.17 Time: 9:30 am Location: Craft Room  Group Topic: Leisure Education  Goal Area(s) Addresses:  Patient will identify things they are grateful for. Patient will verbalize why it is important to be grateful.  Behavioral Response: Inattentive, Left early  Intervention: Grateful Wheel  Activity: Patients were given an I Am Grateful For worksheet and instructed to write things they were grateful for under each category.  Education: LRT educated patients on how being grateful can be a positive change in their lives.  Education Outcome: Patient left before LRT educated group.   Clinical Observations/Feedback: Patient did not participate in group activity. Patient talked about how tired he was. Patient left group at approximately 9:56 am. Patient did not return to group.  Jacquelynn CreeGreene,Nakeitha Milligan M, LRT/CTRS 05/18/2016 10:34 AM

## 2016-05-18 NOTE — [facility] Group Notes (Signed)
Goals Group  Date/Time:  Type of Therapy and Topic: Group Therapy: Goals Group: SMART Goals  ?  Participation Level: Moderate  ?  Description of Group:  ?  The purpose of a daily goals group is to assist and guide patients in setting recovery/wellness-related goals. The objective is to set goals as they relate to the crisis in which they were admitted. Patients will be using SMART goal modalities to set measurable goals. Characteristics of realistic goals will be discussed and patients will be assisted in setting and processing how one will reach their goal. Facilitator will also assist patients in applying interventions and coping skills learned in psycho-education groups to the SMART goal and process how one will achieve defined goal.  ?  Therapeutic Goals:  ?  -Patients will develop and document one goal related to or their crisis in which brought them into treatment.  -Patients will be guided by LCSW using SMART goal setting modality in how to set a measurable, attainable, realistic and time sensitive goal.  -Patients will process barriers in reaching goal.  -Patients will process interventions in how to overcome and successful in reaching goal.  ?  Patient's Goal: Pt was invited but did not attend. ?  Therapeutic Modalities:  Motivational Interviewing  Cognitive Behavioral Therapy  Crisis Intervention Model  SMART goals setting  Hampton AbbotKadijah Holden Draughon, MSW, LCSW-A 05/18/16   10:54AM

## 2016-05-19 NOTE — Tx Team (Signed)
Interdisciplinary Treatment Plan Update (Adult)         Date: 05/19/2016   Time Reviewed: 10:30 AM   Progress in Treatment: Improving Attending groups: Yes  Participating in groups: Yes  Taking medication as prescribed: Yes  Tolerating medication: Yes  Family/Significant other contact made: CSW is still assessing proper contacts Patient understands diagnosis: Yes  Discussing patient identified problems/goals with staff: Yes  Medical problems stabilized or resolved: Yes  Denies suicidal/homicidal ideation: Yes  Issues/concerns per patient self-inventory: Yes  Other:   New problem(s) identified: N/A   Discharge Plan or Barriers: see below   Reason for Continuation of Hospitalization:   Depression   Anxiety   Medication Stabilization   Comments: N/A   Estimated length of stay: 7 days    Patient is a 25 year old  male admitted for psychosis. Patient lives in [place], [place]. 25 year old male with history of schizophrenia presented to [facility]  under  IVC  from [place]. He was taken to [facility] by his and for mental health help. She reported to ER that the patient was voicing suicidal ideations as well as homicidal ideations.  Patient will benefit from crisis stabilization, medication evaluation, group therapy, and psycho education in addition to case management for discharge planning. Patient and CSW reviewed pt's identified goals and treatment plan. Pt verbalized understanding and agreed to treatment plan.    Review of initial/current patient goals per problem list:  1. Goal(s): Patient will participate in aftercare plan   Met: Goal Progressing    Target date: 3-5 days post admission date   As evidenced by: Patient will participate within aftercare plan AEB aftercare provider and housing plan at discharge being identified.   CSW will schedule walk-in appointment with [facility] at discharge.  2. Goal (s): Patient will exhibit decreased depressive symptoms and suicidal  ideations.   Met: Goal progressing   Target date: 3-5 days post admission date   As evidenced by: Patient will utilize self-rating of depression at 3 or below and demonstrate decreased signs of depression or be deemed stable for discharge by MD.   05/12/16: Pt is acute at this time. Cannot report a depression score at this time. 05/17/16: Patient repeatedly stated that "he needs help" and "nothing is helping"  05/19/16: Pt is coming out of his room more, states that he is feeling better and reports a depression score of 4 at this time.  3. Goal(s): Patient will demonstrate decreased signs and symptoms of anxiety.   Met: Goal progressing   Target date: 3-5 days post admission date   As evidenced by: Patient will utilize self-rating of anxiety at 3 or below and demonstrated decreased signs of anxiety, or be deemed stable for discharge by MD   05/12/16: Pt is too acute this time; reports an anxiety score of 6 at this time. 05/17/16: Pt reports an anxiety score of 6 at this time. Pt is paranoid at this time - will only leave room for meals.  05/19/16: Pt is attending groups and reports an anxiety score of 5 at this time.   4. Goal(s): Patient will demonstrate decreased signs of psychosis  * Met:  Goal progressing  * Target date: 3-5 days post admission date  * As evidenced by: Patient will demonstrate decreased frequency of AVH or return to baseline function   05/12/16: Pt experiencing psychosis at this time. Pt extremely paranoid at this time. 05/17/16: Pt is still presenting with signs of psychosis at this time. Extremely paranoid and  believes people are out to get him. 05/19/16: Pt denies AVH at this time but still is experiencing some paranoia symptoms.  Attendees:  Patient: [name] Family:  Physician: Orson Slick, MD    05/19/2016 10:30 AM  Nursing: Floyde Parkins , RN     05/19/2016 10:30 AM  Clinical Social Worker: Emilie Rutter, [place]  05/19/2016 10:30 AM  Recreational Therapist:  Everitt Amber                         05/17/2016 10:30 AM

## 2016-05-19 NOTE — Progress Notes (Signed)
D: Pt denies SI/HI/AVHbut noted responding to internal stimuli. Patient isolates to his room not interacting with peers or staff on the unit. Pt appears less anxious, less apprehensive and suspicious of staff.  A: Pt was offered support and encouragement. Pt was given scheduled medications. Pt was encouraged to attend groups. Q 15 minute checks were done for safety.  R:Pt did not attend group. Pt is taking medication. Pt has no complaints.Pt receptive to treatment and safety maintained on unit.

## 2016-05-19 NOTE — Progress Notes (Signed)
Recreation Therapy Notes  Date: 08.11.17 Time: 1:00 pm Location: Craft Room  Group Topic: Coping Skills  Goal Area(s) Addresses:  Patient will participate in healthy coping skill. Patient will verbalize benefit of using art as a coping skill.  Behavioral Response: Did not attend  Intervention: Coloring  Activity: Patients were given coloring sheets to color and were instructed to think about the emotions they were feeling and what their minds were focused on.  Education: LRT educated patients on healthy coping skills.  Education Outcome: Patient did not attend group.  Clinical Observations/Feedback: Patient did not attend group.  Jacquelynn CreeGreene,Lyndall Bellot M, LRT/CTRS 05/19/2016 2:21 PM

## 2016-05-19 NOTE — [facility] Group Notes (Signed)
[facility] Group Notes:  (Nursing/MHT/Case Management/Adjunct)  Date:  05/19/2016  Time:  4:01 AM  Type of Therapy:  Psychoeducational Skills  Participation Level:  Did Not Attend  Summary of Progress/Problems:  Natara Monfort Y Brigido Mera 05/19/2016, 4:01 AM 

## 2016-05-19 NOTE — [facility] Group Notes (Signed)
[facility] LCSW Group Therapy  05/19/2016 3:02 PM  Type of Therapy:  Group Therapy  Participation Level:  Pt did not attend group. CSW invited pt to group.   Summary of Progress/Problems: Feelings around Relapse. Group members discussed the meaning of relapse and shared personal stories of relapse, how it affected them and others, and how they perceived themselves during this time. Group members were encouraged to identify triggers, warning signs and coping skills used when facing the possibility of relapse. Social supports were discussed and explored in detail.    [name] G. Garnette CzechSampson MSW, LCSWA 05/19/2016, 3:02 PM

## 2016-05-19 NOTE — Progress Notes (Signed)
[facility]  05/19/2016 3:35 PM Adrian GivensMarlo Warner  MRN:  [id number]  Subjective:  [name] is a 25 year old male with new onset psychosis of 6 months never treated before. He has been very paranoid and dysfunctional. He improves very slowly and is currently on Zyprexa and Haldol. There is likely underlying DD as he used to have SSD check. He [place] it [place] and it has not been reinstated so follow up is a problem.  Yesterday was the first day that he left his room other than for food. He played some basketball with peers in the courtyard. He attempted to attend group but had to leave early. He continues to be very paranoid and frightened. He cries frequently. He is not able to say much but has been attending to internal stimuli. He was unable to disclose the content. He accepts medications. No somatic complains.  Principal Problem: Schizophreniform disorder (HCC) Diagnosis:   Patient Active Problem List   Diagnosis Date Noted  . 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 Cannabis use disorder, moderate, dependence (HCC) [F12.20] 05/10/2016   Total Time spent with patient: 20 minutes  Past Psychiatric History: psychosis.  Past Medical History:  Past Medical History:  Diagnosis Date  . Depression     Past Surgical History:  Procedure Laterality Date  . BACK SURGERY     Family History: History reviewed. No pertinent family history. Family Psychiatric  History: see H&P. Social History:  History  Alcohol Use No     History  Drug Use  . Types: Marijuana    Comment: rare    Social History   Social History  . Marital status: Single    Spouse name: N/A  . Number of children: N/A  . Years of education: N/A   Social History Main Topics  . Smoking status: Current Some Day Smoker  . Smokeless tobacco: Never Used  . Alcohol use No  . Drug use:     Types: Marijuana     Comment: rare  . Sexual activity: Yes    Birth control/ protection: Condom    Other Topics Concern  . None   Social History Narrative  . None   Additional Social History:    History of alcohol / drug use?: No history of alcohol / drug abuse Negative Consequences of Use: Personal relationships Name of Substance 1: marijuana daily 1 - Age of First Use: unk 1 - Amount (size/oz): unk 1 - Frequency: unk 1 - Duration: unk 1 - Last Use / Amount: unk                  Sleep: Fair  Appetite:  Fair  Current Medications: Current Facility-Administered Medications  Medication Dose Route Frequency Provider Last Rate Last Dose  . acetaminophen (TYLENOL) tablet 650 mg  650 mg Oral Q6H PRN Jimmy FootmanAndrea Hernandez-Gonzalez, MD      . alum & mag hydroxide-simeth (MAALOX/MYLANTA) 200-200-20 MG/5ML suspension 30 mL  30 mL Oral Q4H PRN Jimmy FootmanAndrea Hernandez-Gonzalez, MD      . haloperidol (HALDOL) tablet 5 mg  5 mg Oral BID Shari ProwsJolanta B Pucilowska, MD   5 mg at 05/19/16 0936  . magnesium hydroxide (MILK OF MAGNESIA) suspension 30 mL  30 mL Oral Daily PRN Jimmy FootmanAndrea Hernandez-Gonzalez, MD      . OLANZapine (ZYPREXA) tablet 10 mg  10 mg Oral Q breakfast Shari ProwsJolanta B Pucilowska, MD   10 mg at 05/19/16 0936  . OLANZapine (ZYPREXA) tablet 20 mg  20 mg  Oral QHS Shari Prows, MD   20 mg at 05/18/16 2213  . vitamin B-12 (CYANOCOBALAMIN) tablet 1,000 mcg  1,000 mcg Oral Daily Shari Prows, MD   1,000 mcg at 05/19/16 4098    Lab Results: No results found for this or any previous visit (from the past 48 hour(s)).  Blood Alcohol level:  Lab Results  Component Value Date   [facility] <5 05/09/2016   ETH <5 04/28/2016    Metabolic Disorder Labs: Lab Results  Component Value Date   HGBA1C 5.2 05/12/2016   Lab Results  Component Value Date   PROLACTIN 30.5 (H) 05/12/2016   Lab Results  Component Value Date   CHOL 176 05/12/2016   TRIG 312 (H) 05/12/2016   HDL 55 05/12/2016   CHOLHDL 3.2 05/12/2016   VLDL 62 (H) 05/12/2016   LDLCALC 59 05/12/2016    Physical  Findings: AIMS: Facial and Oral Movements Muscles of Facial Expression: None, normal Lips and Perioral Area: None, normal Jaw: None, normal Tongue: None, normal,Extremity Movements Upper (arms, wrists, hands, fingers): None, normal Lower (legs, knees, ankles, toes): None, normal, Trunk Movements Neck, shoulders, hips: None, normal, Overall Severity Severity of abnormal movements (highest score from questions above): None, normal Incapacitation due to abnormal movements: None, normal Patient's awareness of abnormal movements (rate only patient's report): No Awareness, Dental Status Current problems with teeth and/or dentures?: No Does patient usually wear dentures?: No  CIWA:  CIWA-Ar Total: 0 COWS:  COWS Total Score: 0  Musculoskeletal: Strength & Muscle Tone: within normal limits Gait & Station: normal Patient leans: N/A  Psychiatric Specialty Exam: Physical Exam  Nursing note and vitals reviewed.   Review of Systems  Psychiatric/Behavioral: Positive for hallucinations.  All other systems reviewed and are negative.   Blood pressure (!) 150/99, pulse 67, temperature 98.1 F (36.7 C), temperature source Oral, resp. rate 18, height 5\' 5"  (1.651 m), weight 72.8 kg (160 lb 8 oz), SpO2 100 %.Body mass index is 26.71 kg/m.  General Appearance: Disheveled  Eye Contact:  Poor  Speech:  Slow  Volume:  Decreased  Mood:  Depressed and Hopeless  Affect:  Labile  Thought Process:  Disorganized  Orientation:  Full (Time, Place, and Person)  Thought Content:  Delusions and Paranoid Ideation  Suicidal Thoughts:  No  Homicidal Thoughts:  No  Memory:  Immediate;   Fair Recent;   Fair Remote;   Fair  Judgement:  Impaired  Insight:  Lacking  Psychomotor Activity:  Decreased  Concentration:  Concentration: Fair and Attention Span: Fair  Recall:  [employer] of Knowledge:  Fair  Language:  Fair  Akathisia:  No  Handed:  Right  AIMS (if indicated):     Assets:  Communication  Skills Desire for Improvement Housing Physical Health Resilience Social Support  ADL's:  Intact  Cognition:  WNL  Sleep:  Number of Hours: 7.75     Treatment Plan Summary: Daily contact with patient to assess and evaluate symptoms and progress in treatment and Medication management   [name] is a 25 year old male with a history of psychosis admitted for first psychotic break.   1. Schizophreniform disorder. The patient was started on olanzapine for psychosis.Will gine 10 mg in the morning and 20 mg in the evening and added Haldol 5 mg bid.  2, Catatonia. Resolved. This was addressed with Ativan.  3. Substance abuse. The patient smokes marijuana. He minimizes his problems and is not interested in treatment.   4. Metabolic syndrome  monitoring. Lipid profile, TSH and HgbA1C were normal. Prolactin 30.5.   5. B12 defficiency. Vit B12 levwl is low. Will start supplementation.   6. STDs. HIV and RPR are negative.   7. Head Ct scan is unremarkable.   8. Disposition. He will be discharged to home with family. He will follow up with [facility].   Kristine Linea, MD 05/19/2016, 3:35 PM

## 2016-05-19 NOTE — Plan of Care (Signed)
Problem: Health Behavior/Discharge Planning: Goal: Ability to manage health-related needs will improve Outcome: Progressing Med compliant. Mouth check done after medication administration.

## 2016-05-19 NOTE — [facility] Group Notes (Signed)
[facility] Group Notes:  (Nursing/MHT/Case Management/Adjunct)  Date:  05/19/2016  Time:  3:58 PM  Type of Therapy:  Psychoeducational Skills  Participation Level:  Did Not Attend   Lynelle SmokeCara Travis [facility] Sax[facility] Surgery CenterMadoni 05/19/2016, 3:58 PM

## 2016-05-19 NOTE — Progress Notes (Signed)
Patient noted to be isolated to his room, confused, bizarre, unable to complete a task and thought blocking. He stated that he was "scared of people", was breathing hard with hand gestures and kept staring into space. Patient also endorsed auditory hallucinations but couldn't state what the voices were saying. Patient was escorted to his room, when he got to his room he laid on the floor and had to be redirected. Patient reassured and was noted resting quietly in bed. Denied SI but endorsed AH. Staff made frequent verbal contacts through out.

## 2016-05-20 MED ORDER — LORAZEPAM 2 MG PO TABS
2.0000 mg | ORAL_TABLET | Freq: Once | ORAL | Status: AC
  Administered 2016-05-20: 2 mg via ORAL

## 2016-05-20 MED ORDER — LORAZEPAM 2 MG PO TABS
ORAL_TABLET | ORAL | Status: AC
  Filled 2016-05-20: qty 1

## 2016-05-20 NOTE — Progress Notes (Signed)
[facility] MD Progress Note  05/20/2016 2:20 PM [name]  MRN:  [id number]  Subjective:  [name] is a 25 year old male with new onset psychosis of 6 months never treated before. He has been very paranoid and dysfunctional. He improves very slowly and is currently on Zyprexa and Haldol. There is likely underlying DD as he used to have SSD check. He [place] it [place] in [place] and it has not been reinstated so follow up is a problem.  Patient interviewed. Chart reviewed. As of Saturday the 12th the patient remains clearly psychotic. He is staying withdrawn to his room. When I spoke with him today he did not speak at all except to whisper the words go home. Other questions that I ask him he responded with facial gestures or shrugs. Affect looks sad and sort of lost. He is not acting out in a dangerous manner. Seems to be tolerating medicine. Still not able to take care of himself adequately.  Principal Problem: Schizophreniform disorder (HCC) Diagnosis:   Patient Active Problem List   Diagnosis Date Noted  . 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 Cannabis use disorder, moderate, dependence (HCC) [F12.20] 05/10/2016   Total Time spent with patient: 20 minutes  Past Psychiatric History: psychosis.  Past Medical History:  Past Medical History:  Diagnosis Date  . Depression     Past Surgical History:  Procedure Laterality Date  . BACK SURGERY     Family History: History reviewed. No pertinent family history. Family Psychiatric  History: see H&P. Social History:  History  Alcohol Use No     History  Drug Use  . Types: Marijuana    Comment: rare    Social History   Social History  . Marital status: Single    Spouse name: N/A  . Number of children: N/A  . Years of education: N/A   Social History Main Topics  . Smoking status: Current Some Day Smoker  . Smokeless tobacco: Never Used  . Alcohol use No  . Drug use:     Types: Marijuana     Comment: rare  . Sexual  activity: Yes    Birth control/ protection: Condom   Other Topics Concern  . None   Social History Narrative  . None   Additional Social History:    History of alcohol / drug use?: No history of alcohol / drug abuse Negative Consequences of Use: Personal relationships Name of Substance 1: marijuana daily 1 - Age of First Use: unk 1 - Amount (size/oz): unk 1 - Frequency: unk 1 - Duration: unk 1 - Last Use / Amount: unk                  Sleep: Fair  Appetite:  Fair  Current Medications: Current Facility-Administered Medications  Medication Dose Route Frequency Provider Last Rate Last Dose  . LORazepam (ATIVAN) 2 MG tablet           . acetaminophen (TYLENOL) tablet 650 mg  650 mg Oral Q6H PRN Jimmy Footman, MD      . alum & mag hydroxide-simeth (MAALOX/MYLANTA) 200-200-20 MG/5ML suspension 30 mL  30 mL Oral Q4H PRN Jimmy Footman, MD      . haloperidol (HALDOL) tablet 5 mg  5 mg Oral BID Shari Prows, MD   5 mg at 05/20/16 0910  . magnesium hydroxide (MILK OF MAGNESIA) suspension 30 mL  30 mL Oral Daily PRN Jimmy Footman, MD      . OLANZapine [facility]) tablet 10  mg  10 mg Oral Q breakfast Jolanta B Pucilowska, MD   10 mg at 05/20/16 0910  . OLANZapine (ZYPREXA) tablet 20 mg  20 mg Oral QHS Shari Prows, MD   20 mg at 05/19/16 2154  . vitamin B-12 (CYANOCOBALAMIN) tablet 1,000 mcg  1,000 mcg Oral Daily Shari Prows, MD   1,000 mcg at 05/20/16 0910    Lab Results: No results found for this or any previous visit (from the past 48 hour(s)).  Blood Alcohol level:  Lab Results  Component Value Date   [facility] <5 05/09/2016   ETH <5 04/28/2016    Metabolic Disorder Labs: Lab Results  Component Value Date   HGBA1C 5.2 05/12/2016   Lab Results  Component Value Date   PROLACTIN 30.5 (H) 05/12/2016   Lab Results  Component Value Date   CHOL 176 05/12/2016   TRIG 312 (H) 05/12/2016   HDL 55 05/12/2016   CHOLHDL  3.2 05/12/2016   VLDL 62 (H) 05/12/2016   LDLCALC 59 05/12/2016    Physical Findings: AIMS: Facial and Oral Movements Muscles of Facial Expression: None, normal Lips and Perioral Area: None, normal Jaw: None, normal Tongue: None, normal,Extremity Movements Upper (arms, wrists, hands, fingers): None, normal Lower (legs, knees, ankles, toes): None, normal, Trunk Movements Neck, shoulders, hips: None, normal, Overall Severity Severity of abnormal movements (highest score from questions above): None, normal Incapacitation due to abnormal movements: None, normal Patient's awareness of abnormal movements (rate only patient's report): No Awareness, Dental Status Current problems with teeth and/or dentures?: No Does patient usually wear dentures?: No  CIWA:  CIWA-Ar Total: 0 COWS:  COWS Total Score: 0  Musculoskeletal: Strength & Muscle Tone: within normal limits Gait & Station: normal Patient leans: N/A  Psychiatric Specialty Exam: Physical Exam  Nursing note and vitals reviewed. Constitutional: He appears well-developed and well-nourished.  HENT:  Head: Normocephalic and atraumatic.  Eyes: Conjunctivae are normal. Pupils are equal, round, and reactive to light.  Neck: Normal range of motion.  Cardiovascular: Normal heart sounds.   Respiratory: Effort normal.  GI: Soft.  Musculoskeletal: Normal range of motion.  Neurological: He is alert.  Skin: Skin is warm and dry.  Psychiatric: His affect is blunt. His speech is delayed. He is slowed. He expresses inappropriate judgment. He is noncommunicative. He exhibits abnormal recent memory.    Review of Systems  Constitutional: Negative.   HENT: Negative.   Eyes: Negative.   Respiratory: Negative.   Cardiovascular: Negative.   Gastrointestinal: Negative.   Musculoskeletal: Negative.   Skin: Negative.   Neurological: Negative.   Psychiatric/Behavioral: Positive for hallucinations. The patient is nervous/anxious.   All other  systems reviewed and are negative.   Blood pressure 125/89, pulse 79, temperature 98.5 F (36.9 C), temperature source Oral, resp. rate 18, height  (1.651 m), weight 72.8 kg (160 lb 8 oz), SpO2 100 %.Body mass index is 26.71 kg/m.  General Appearance: Disheveled  Eye Contact:  Poor  Speech:  Slow  Volume:  Decreased  Mood:  Depressed and Hopeless  Affect:  Labile  Thought Process:  Disorganized  Orientation:  Full (Time, Place, and Person)  Thought Content:  Delusions and Paranoid Ideation  Suicidal Thoughts:  No  Homicidal Thoughts:  No  Memory:  Immediate;   Fair Recent;   Fair Remote;   Fair  Judgement:  Impaired  Insight:  Lacking  Psychomotor Activity:  Decreased  Concentration:  Shari Prowsir and Attention Span: Fair  Recall:  Fair  Fund of Knowledge:  Fair  Language:  Fair  Akathisia:  No  Handed:  Right  AIMS (if indicated):     Assets:  Communication Skills Desire for Improvement Housing Physical Health Resilience Social Support  ADL's:  Intact  Cognition:  WNL  Sleep:  Number of Hours: 6.5     Treatment Plan Summary: Daily contact with patient to assess and evaluate symptoms and progress in treatment and Medication management   [name] is a 25 year old male with a history of psychosis admitted for first psychotic break.   1. Schizophreniform disorder. The patient was started on olanzapine for psychosis.Will gine 10 mg in the morning and 20 mg in the evening and added Haldol 5 mg bid.  2, Catatonia. Resolved. This was addressed with Ativan.  3. Substance abuse. The patient smokes marijuana. He minimizes his problems and is not interested in treatment.   4. Metabolic syndrome monitoring. Lipid profile, TSH and HgbA1C were normal. Prolactin 30.5.   5. B12 defficiency. Vit B12 levwl is low. Will start supplementation.   6. STDs. HIV and RPR are negative.   7. Head Ct scan is unremarkable.   8. Disposition. He will be discharged  to home with family. He will follow up with [facility].  Labs negative. Vital signs stable. Patient appears to be having a psychotic disorder and possibly intermittent early response to medicine. Slightly sedated but mainly just withdrawn. Supportive therapy and encouragement completed. No change to medicine today.   Mordecai RasmussenJohn Anitria Andon, MD 05/20/2016, 2:20 PM

## 2016-05-20 NOTE — Progress Notes (Signed)
Patient with increasing anxiety and restlessness. MD orders Ativan 2 mg po prn OTO and medication administered. One on one with patient to process increasing anxiety and agitation. Patient states he is scared. Showers and dresses to help decrease anxiety. Watches tv for less then 5 minutes. Paces hall with Clinical research associatewriter. Returns to room to bed. Patient states people scare him. Safety maintained at this time.

## 2016-05-20 NOTE — [facility] Group Notes (Signed)
[facility] LCSW Group Therapy  05/20/2016 2:43 PM  Type of Therapy:  Group Therapy  Participation Level:  Pt did not attend group. CSW invited pt to group.   Summary of Progress/Problems:Self esteem: Patients discussed self esteem and how it impacts them. They discussed what aspects in their lives has influenced their self esteem. They were challenged to identify changes that are needed in order to improve self esteem. Patients participated in activity where they had to identify positive adjectives they felt described their personality. Patients shared with the group on the following areas: Things I am good at, What I like about my appearance, I've helped others by, What I value the most, compliments I have received, challenges I have overcome, thing that make me unique, and Times I've made others happy.    [name] G. Garnette CzechSampson MSW, LCSWA 05/20/2016, 2:43 PM

## 2016-05-20 NOTE — Progress Notes (Signed)
Patient calm and comfortable. Resting in room in bed. Awakens for assessment. No distress, no discomfort. Safety maintained.

## 2016-05-20 NOTE — [facility] Group Notes (Signed)
[facility] Group Notes:  (Nursing/MHT/Case Management/Adjunct)  Date:  05/20/2016  Time:  2:32 AM  Type of Therapy:  Psychoeducational Skills  Participation Level:  Did Not Attend   Summary of Progress/Problems:  Chancy MilroyLaquanda Y Lennon Richins 05/20/2016, 2:32 AM

## 2016-05-20 NOTE — [facility] Group Notes (Signed)
[facility] Group Notes:  (Nursing/MHT/Case Management/Adjunct)  Date:  05/20/2016  Time:  11:13 AM  Type of Therapy:  goal setting   Participation Level:  Did Not Attend  Twanna Hymanda C Wreatha Sturgeon 05/20/2016, 11:13 AM

## 2016-05-20 NOTE — Progress Notes (Signed)
Patient with depressed affect, cooperative with meds. Withdrawn and seclusive in room. Guarded with staff. Patient states "I am scared of people", remains paranoid. No SI/HI, safety maintained.

## 2016-05-20 NOTE — Progress Notes (Signed)
D: Pt denies SI/HI/AVH, but noted responding to internal stimuli. Patient continues to be suspicious of staff, isolates to room not interacting with peers,minimal interaction with staff. Patient's thoughts are disorganised delayed response and thought blocking noted during conversation. Patient appears less anxious.  A: Pt was offered support and encouragement. Pt was given scheduled medications. Pt was encouraged to attend groups. Q 15 minute checks were done for safety.  R:Pt did not attend evening group. Pt is taking medication. Pt has no complaints.Pt receptive to treatment and safety maintained on unit.

## 2016-05-20 NOTE — Progress Notes (Signed)
Patient states that the medicine (Ativan) he received helped him.

## 2016-05-21 MED ORDER — OLANZAPINE 7.5 MG PO TABS
15.0000 mg | ORAL_TABLET | Freq: Every day | ORAL | Status: DC
  Administered 2016-05-22 – 2016-05-23 (×2): 15 mg via ORAL
  Filled 2016-05-21: qty 2

## 2016-05-21 NOTE — Progress Notes (Signed)
Patient ID: Adrian GivensMarlo Warner, male   DOB: 27-Jul-1991, 25 y.o.   MRN: [id number] Patient now came to speak to me again. Communicated much better. He is tearful and talks about being frightened. Denies plans to hurt himself but is clearly very upset. Talks a lot about wanting to go home. Affect down and tearful. Try to give some supportive counseling with him. As noted previously medicines of increased. Encourage him to try and stay out of his room to work on being less nervous around others.

## 2016-05-21 NOTE — Progress Notes (Signed)
Pt disorganized. Difficult to assess. Pt walked out into hallway and stated " I can't walk, I need a wheelchair." He did this twice and both times his gait was steady. Telling staff, "I cannot move my feet" Attempted to get other Pts to go and get him something to drink from dayroom b/c he didn't want to walk down there. Did not attend evening group. Pt stated " I am tired, I don't know what is going on". Responses are delayed. Compliant with medications. Safety maintained.

## 2016-05-21 NOTE — [facility] Group Notes (Signed)
[facility] LCSW Group Therapy  05/21/2016 3:42 PM  Type of Therapy:  Group Therapy  Participation Level:  Pt did not attend group. CSW invited pt to group.   Summary of Progress/Problems:Safety Planning: Patients identified fears or worries surrounding discharge. Patients offered support to their peers and openly developed safety plans for their individual needs. Patients developed their own safety plan. Patients discussed their warning signs, coping strategies, support system with family and friends, identified mental health professionals, and how to keep their environments safe (ex. Removing unnecessary medications or removing weapons/guns). Patients then discussed their personalized safety plan with the group.    Shamari Lofquist G. Garnette CzechSampson MSW, LCSWA 05/21/2016, 3:42 PM

## 2016-05-21 NOTE — Progress Notes (Signed)
[employer] MD Progress Note  05/21/2016 4:42 PM [name]  MRN:  [id number]  Subjective:  [name] is a 25 year old male with new onset psychosis of 6 months never treated before. He has been very paranoid and dysfunctional. He improves very slowly and is currently on Zyprexa and Haldol. There is likely underlying DD as he used to have SSD check. He lost it while in [place] and it has not been reinstated so follow up is a problem.  Follow-up Sunday the 13th. Patient was able to speak to me more appropriately today. Actually used words. He says that he thinks he is ready to go home. He didn't say much else than that. Shakes his head no about any suicidal thoughts or hallucinations. Still stays in bed pretty much all of the time. No new physical complaints or problems. Principal Problem: Schizophreniform disorder (HCC) Diagnosis:   Patient Active Problem List   Diagnosis Date Noted  . 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 Cannabis use disorder, moderate, dependence (HCC) [F12.20] 05/10/2016   Total Time spent with patient: 20 minutes  Past Psychiatric History: psychosis.  Past Medical History:  Past Medical History:  Diagnosis Date  . Depression     Past Surgical History:  Procedure Laterality Date  . BACK SURGERY     Family History: History reviewed. No pertinent family history. Family Psychiatric  History: see H&P. Social History:  History  Alcohol Use No     History  Drug Use  . Types: Marijuana    Comment: rare    Social History   Social History  . Marital status: Single    Spouse name: N/A  . Number of children: N/A  . Years of education: N/A   Social History Main Topics  . Smoking status: Current Some Day Smoker  . Smokeless tobacco: Never Used  . Alcohol use No  . Drug use:     Types: Marijuana     Comment: rare  . Sexual activity: Yes    Birth control/ protection: Condom   Other Topics Concern  . None   Social History Narrative  . None    Additional Social History:    History of alcohol / drug use?: No history of alcohol / drug abuse Negative Consequences of Use: Personal relationships Name of Substance 1: marijuana daily 1 - Age of First Use: unk 1 - Amount (size/oz): unk 1 - Frequency: unk 1 - Duration: unk 1 - Last Use / Amount: unk                  Sleep: Fair  Appetite:  Fair  Current Medications: Current Facility-Administered Medications  Medication Dose Route Frequency Provider Last Rate Last Dose  . acetaminophen (TYLENOL) tablet 650 mg  650 mg Oral Q6H PRN Jimmy Footman, MD      . alum & mag hydroxide-simeth (MAALOX/MYLANTA) 200-200-20 MG/5ML suspension 30 mL  30 mL Oral Q4H PRN Jimmy Footman, MD      . haloperidol (HALDOL) tablet 5 mg  5 mg Oral BID Shari Prows, MD   5 mg at 05/21/16 0810  . magnesium hydroxide (MILK OF MAGNESIA) suspension 30 mL  30 mL Oral Daily PRN Jimmy Footman, MD      . OLANZapine (ZYPREXA) tablet 10 mg  10 mg Oral Q breakfast Jolanta B Pucilowska, MD   10 mg at 05/21/16 0810  . OLANZapine (ZYPREXA) tablet 20 mg  20 mg Oral QHS Jolanta B Pucilowska, MD   20 mg at  05/20/16 2136  . vitamin B-12 (CYANOCOBALAMIN) tablet 1,000 mcg  1,000 mcg Oral Daily Shari Prows, MD   1,000 mcg at 05/21/16 0809    Lab Results: No results found for this or any previous visit (from the past 48 hour(s)).  Blood Alcohol level:  Lab Results  Component Value Date   [facility] <5 05/09/2016   ETH <5 04/28/2016    Metabolic Disorder Labs: Lab Results  Component Value Date   HGBA1C 5.2 05/12/2016   Lab Results  Component Value Date   PROLACTIN 30.5 (H) 05/12/2016   Lab Results  Component Value Date   CHOL 176 05/12/2016   TRIG 312 (H) 05/12/2016   HDL 55 05/12/2016   CHOLHDL 3.2 05/12/2016   VLDL 62 (H) 05/12/2016   LDLCALC 59 05/12/2016    Physical Findings: AIMS: Facial and Oral Movements Muscles of Facial Expression: None,  normal Lips and Perioral Area: None, normal Jaw: None, normal Tongue: None, normal,Extremity Movements Upper (arms, wrists, hands, fingers): None, normal Lower (legs, knees, ankles, toes): None, normal, Trunk Movements Neck, shoulders, hips: None, normal, Overall Severity Severity of abnormal movements (highest score from questions above): None, normal Incapacitation due to abnormal movements: None, normal Patient's awareness of abnormal movements (rate only patient's report): No Awareness, Dental Status Current problems with teeth and/or dentures?: No Does patient usually wear dentures?: No  CIWA:  CIWA-Ar Total: 0 COWS:  COWS Total Score: 0  Musculoskeletal: Strength & Muscle Tone: within normal limits Gait & Station: normal Patient leans: N/A  Psychiatric Specialty Exam: Physical Exam  Nursing note and vitals reviewed. Constitutional: He appears well-developed and well-nourished.  HENT:  Head: Normocephalic and atraumatic.  Eyes: Conjunctivae are normal. Pupils are equal, round, and reactive to light.  Neck: Normal range of motion.  Cardiovascular: Normal heart sounds.   Respiratory: Effort normal.  GI: Soft.  Musculoskeletal: Normal range of motion.  Neurological: He is alert.  Skin: Skin is warm and dry.  Psychiatric: His affect is blunt. His speech is delayed. He is slowed. He expresses inappropriate judgment. He is noncommunicative. He exhibits abnormal recent memory.    Review of Systems  Constitutional: Negative.   HENT: Negative.   Eyes: Negative.   Respiratory: Negative.   Cardiovascular: Negative.   Gastrointestinal: Negative.   Musculoskeletal: Negative.   Skin: Negative.   Neurological: Negative.   Psychiatric/Behavioral: Positive for hallucinations. The patient is nervous/anxious.   All other systems reviewed and are negative.   Blood pressure 130/84, pulse 90, temperature 98.2 F (36.8 C), temperature source Oral, resp. rate 20, height  (1.651  m), weight 72.8 kg (160 lb 8 oz), SpO2 100 %.Body mass index is 26.71 kg/m.  General Appearance: Disheveled  Eye Contact:  Poor  Speech:  Slow  Volume:  Decreased  Mood:  Depressed and Hopeless  Affect:  Labile  Thought Process:  Disorganized  Orientation:  Full (Time, Place, and Person)  Thought Content:  Delusions and Paranoid Ideation  Suicidal Thoughts:  No  Homicidal Thoughts:  No  Memory:  Immediate;   Fair Recent;   Fair Remote;   Fair  Judgement:  Impaired  Insight:  Lacking  Psychomotor Activity:  Decreased  Concentration:  Concentration: Fair and Attention Span: Fair  Recall:  [employer] of Knowledge:  Fair  Language:  Fair  Akathisia:  No  Handed:  Right  AIMS (if indicated):     Assets:  Communication Skills Desire for Improvement Housing Physical Health Resilience Social Support  ADL's:  Intact  Cognition:  WNL  Sleep:  Number of Hours: 7.15     Treatment Plan Summary: Daily contact with patient to assess and evaluate symptoms and progress in treatment and Medication management   [name] is a 25 year old male with a history of psychosis admitted for first psychotic break.   1. Schizophreniform disorder. The patient was started on olanzapine for psychosis.Will gine 10 mg in the morning and 20 mg in the evening and added Haldol 5 mg bid.  2, Catatonia. Resolved. This was addressed with Ativan.  3. Substance abuse. The patient smokes marijuana. He minimizes his problems and is not interested in treatment.   4. Metabolic syndrome monitoring. Lipid profile, TSH and HgbA1C were normal. Prolactin 30.5.   5. B12 defficiency. Vit B12 levwl is low. Will start supplementation.   6. STDs. HIV and RPR are negative.   7. Head Ct scan is unremarkable.   8. Disposition. He will be discharged to home with family. He will follow up with [facility].  Labs negative. Vital signs stable. Patient appears to be having a psychotic disorder and possibly  intermittent early response to medicine. Slightly sedated but mainly just withdrawn. Supportive therapy and encouragement completed. No change to medicine today.  Continue current medicine. Encouraged him to get up and get out of bed. Vitals stable. No other change to medicine.   Mordecai RasmussenJohn Steffon Gladu, MD 05/21/2016, 4:42 PM

## 2016-05-21 NOTE — Plan of Care (Signed)
Problem: Coping: Goal: Ability to cope will improve Outcome: Not Progressing Patient remains suspicious and cautious. Will eat meals in dayroom.

## 2016-05-21 NOTE — [facility] Group Notes (Signed)
[facility] Group Notes:  (Nursing/MHT/Case Management/Adjunct)  Date:  05/21/2016  Time:  12:36 PM  Type of Therapy:  Psychoeducational Skills  Participation Level:  Did Not Attend   Lynelle SmokeCara Travis [facility] HospitalMadoni 05/21/2016, 12:36 PM

## 2016-05-21 NOTE — Progress Notes (Signed)
Patient ID: Adrian GivensMarlo Woon, male   DOB: 05-Oct-1991, 25 y.o.   MRN: [id number] Spoke with nursing again this afternoon. They are concerned about his lack of progress. Still very paranoid and withdrawn. He takes his medicine but otherwise interacts very little. I did increase his morning dose of Zyprexa to 15 mg.

## 2016-05-21 NOTE — Progress Notes (Addendum)
Patient with depressed affect, suspicious related hospital unit and other people. Patient isolative in room, will eat meals in dayroom. Noted to look behind himself when in med room and hall. Patient with no SI/HI, states he is scared of people. Minimal interaction with peers. Peers say hello to patient. Safety maintained. Does well with encouragement and support. Patient oriented to name only. MD meets with patient.

## 2016-05-22 NOTE — Progress Notes (Signed)
Recreation Therapy Notes  Date: 08.14.17 Time: 9:30 am Location: Craft Room  Group Topic: Self-expression  Goal Area(s) Addresses:  Patient will identify one color per emotion listed on wheel. Patient will verbalize one emotion experienced during session. Patient will be educated on other forms of self-expression.  Behavioral Response: Intermittently Attentive  Intervention: Emotion Wheel  Activity: Patients were given an Emotion Wheel worksheet and instructed to pick a color for each emotion listed on the wheel.  Education: LRT educated patients on different forms of self-expression.  Education Outcome: In group clarification offered  Clinical Observations/Feedback: Patient picked colors for 3 emotions. Patient stated he was very tired and laid his head on the table for the majority of group. Patient did not contribute to group discussion.  Jacquelynn CreeGreene,Ryin Schillo M, LRT/CTRS 05/22/2016 10:29 AM

## 2016-05-22 NOTE — Plan of Care (Signed)
Problem: Coping: Goal: Ability to verbalize feelings will improve Outcome: Not Progressing Continues to be paranoid and disorganized. Only communicates that he is ready to be discharged and that he's "afraid of people". Encouraged verbalization of feelings and attendance in group, to get out of room. Refuses. Will continue to monitor.

## 2016-05-22 NOTE — Progress Notes (Signed)
D: Patient was very bizarre and paranoid during assessment. He keeps stating he doesn't know where he is and what's going on. He has been isolative this evening. Denies SI/HI/AVH.  A: Medications given with education. Encouragement provided.  R: Patient was compliant with medication. Patient has been calm and cooperative. Safety maintained with 15 min checks.

## 2016-05-22 NOTE — Plan of Care (Signed)
Problem: Education: Goal: Will be free of psychotic symptoms Outcome: Not Progressing Patient remains paranoid and very bizarre.

## 2016-05-22 NOTE — [facility] Group Notes (Signed)
[facility] Group Notes:  (Nursing/MHT/Case Management/Adjunct)  Date:  05/22/2016  Time:  3:10 AM  Type of Therapy:  Group Therapy  Participation Level:  Did Not Attend    Veva Holesshley Imani Cindee Mclester 05/22/2016, 3:10 AM

## 2016-05-22 NOTE — Progress Notes (Signed)
D: Continues to be paranoid and disorganized, isolative. Denies SI/HI/AVH. Called nurse to room to report that he "Can't walk. It's like I"m walking, but I'm not."   A: Medications given as ordered. Support and encouragement provided. Safety checks every 15 minutes.  R: Medication compliant. Safety maintained every 15 minutes. Gait steady when ambulating.

## 2016-05-22 NOTE — Progress Notes (Addendum)
[facility]  05/22/2016 2:42 PM Talitha GivensMarlo Lenzen  MRN:  [id number]  Subjective:  [name] remains floridly psychotic, fearful, and frightened. Today he is unable to speak in full sentences and mumbles incomprehensively. Yesterday his Zyprexa was increased to 15 mg in the morning 20 mg at bedtime. He also gets Haldol. Appetite is poor in spite of paranoid today. The patient attends groups briefly only with outmost encouragement.   Principal Problem: Schizophreniform disorder (HCC) Diagnosis:   Patient Active Problem List   Diagnosis Date Noted  . 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 Cannabis use disorder, moderate, dependence (HCC) [F12.20] 05/10/2016   Total Time spent with patient: 20 minutes  Past Psychiatric History: psychosis.  Past Medical History:  Past Medical History:  Diagnosis Date  . Depression     Past Surgical History:  Procedure Laterality Date  . BACK SURGERY     Family History: History reviewed. No pertinent family history. Family Psychiatric  History: see H&P. Social History:  History  Alcohol Use No     History  Drug Use  . Types: Marijuana    Comment: rare    Social History   Social History  . Marital status: Single    Spouse name: N/A  . Number of children: N/A  . Years of education: N/A   Social History Main Topics  . Smoking status: Current Some Day Smoker  . Smokeless tobacco: Never Used  . Alcohol use No  . Drug use:     Types: Marijuana     Comment: rare  . Sexual activity: Yes    Birth control/ protection: Condom   Other Topics Concern  . None   Social History Narrative  . None   Additional Social History:    History of alcohol / drug use?: No history of alcohol / drug abuse Negative Consequences of Use: Personal relationships Name of Substance 1: marijuana daily 1 - Age of First Use: unk 1 - Amount (size/oz): unk 1 - Frequency: unk 1 - Duration: unk 1 - Last Use / Amount: unk                  Sleep: Fair  Appetite:  Fair  Current Medications: Current Facility-Administered Medications  Medication Dose Route Frequency Provider Last Rate Last Dose  . acetaminophen (TYLENOL) tablet 650 mg  650 mg Oral Q6H PRN Jimmy FootmanAndrea Hernandez-Gonzalez, MD      . alum & mag hydroxide-simeth (MAALOX/MYLANTA) 200-200-20 MG/5ML suspension 30 mL  30 mL Oral Q4H PRN Jimmy FootmanAndrea Hernandez-Gonzalez, MD      . haloperidol (HALDOL) tablet 5 mg  5 mg Oral BID Shari ProwsJolanta B Tomorrow Dehaas, MD   5 mg at 05/22/16 0935  . magnesium hydroxide (MILK OF MAGNESIA) suspension 30 mL  30 mL Oral Daily PRN Jimmy FootmanAndrea Hernandez-Gonzalez, MD      . OLANZapine (ZYPREXA) tablet 15 mg  15 mg Oral Q breakfast Audery AmelJohn T Clapacs, MD   15 mg at 05/22/16 0937  . OLANZapine (ZYPREXA) tablet 20 mg  20 mg Oral QHS Demontez Novack B Arbell Wycoff, MD   20 mg at 05/21/16 2100  . vitamin B-12 (CYANOCOBALAMIN) tablet 1,000 mcg  1,000 mcg Oral Daily Shari ProwsJolanta B Aspasia Rude, MD   1,000 mcg at 05/22/16 [id number]    Lab Results: No results found for this or any previous visit (from the past 48 hour(s)).  Blood Alcohol level:  Lab Results  Component Value Date   ETH <5 05/09/2016   ETH <5 04/28/2016  Metabolic Disorder Labs: Lab Results  Component Value Date   HGBA1C 5.2 05/12/2016   Lab Results  Component Value Date   PROLACTIN 30.5 (H) 05/12/2016   Lab Results  Component Value Date   CHOL 176 05/12/2016   TRIG 312 (H) 05/12/2016   HDL 55 05/12/2016   CHOLHDL 3.2 05/12/2016   VLDL 62 (H) 05/12/2016   LDLCALC 59 05/12/2016    Physical Findings: AIMS: Facial and Oral Movements Muscles of Facial Expression: None, normal Lips and Perioral Area: None, normal Jaw: None, normal Tongue: None, normal,Extremity Movements Upper (arms, wrists, hands, fingers): None, normal Lower (legs, knees, ankles, toes): None, normal, Trunk Movements Neck, shoulders, hips: None, normal, Overall Severity Severity of abnormal movements (highest score from questions above):  None, normal Incapacitation due to abnormal movements: None, normal Patient's awareness of abnormal movements (rate only patient's report): No Awareness, Dental Status Current problems with teeth and/or dentures?: No Does patient usually wear dentures?: No  CIWA:  CIWA-Ar Total: 0 COWS:  COWS Total Score: 0  Musculoskeletal: Strength & Muscle Tone: within normal limits Gait & Station: normal Patient leans: N/A  Psychiatric Specialty Exam: Physical Exam  Nursing note and vitals reviewed.   Review of Systems  Psychiatric/Behavioral: Positive for hallucinations.  All other systems reviewed and are negative.   Blood pressure (!) 142/89, pulse 86, temperature 97.7 F (36.5 C), temperature source Oral, resp. rate 20, height 5\' 5"  (1.651 m), weight 72.8 kg (160 lb 8 oz), SpO2 100 %.Body mass index is 26.71 kg/m.  General Appearance: Disheveled  Eye Contact:  Poor  Speech:  Slow  Volume:  Decreased  Mood:  Depressed and Hopeless  Affect:  Labile  Thought Process:  Disorganized  Orientation:  Full (Time, Place, and Person)  Thought Content:  Delusions and Paranoid Ideation  Suicidal Thoughts:  No  Homicidal Thoughts:  No  Memory:  Immediate;   Fair Recent;   Fair Remote;   Fair  Judgement:  Impaired  Insight:  Lacking  Psychomotor Activity:  Decreased  Concentration:  Concentration: Fair and Attention Span: Fair  Recall:  [employer] of Knowledge:  Fair  Language:  Fair  Akathisia:  No  Handed:  Right  AIMS (if indicated):     Assets:  Communication Skills Desire for Improvement Housing Physical Health Resilience Social Support  ADL's:  Intact  Cognition:  WNL  Sleep:  Number of Hours: 8     Treatment Plan Summary: Daily contact with patient to assess and evaluate symptoms and progress in treatment and Medication management   [name] is a 25 year old male with a history of psychosis admitted for first psychotic break.   1. Schizophreniform disorder. The  patient was started on olanzapine for psychosis.Currently 15 mg in the morning and 20 mg in the evening together with Haldol 5 mg bid.  2, Catatonia. Resolved. This was addressed with Ativan.  3. Substance abuse. The patient smokes marijuana. He minimizes his problems and is not interested in treatment.   4. Metabolic syndrome monitoring. Lipid profile, TSH and HgbA1C were normal. Prolactin 30.5.   5. B12 defficiency. Vit B12 levwl is low. Will start supplementation.   6. STDs. HIV and RPR are negative.   7. Head Ct scan is unremarkable.   8. Disposition. He will be discharged to home with family. He will follow up with [facility].   I certify that the services received since the previous certification/recertification were and continue to be medically necessary as the treatment provided  can be reasonably expected to improve the patient's condition; the medical record documents that the services furnished were intensive treatment services or their equivalent services, and this patient continues to need, on a daily basis, active treatment furnished directly by or requiring the supervision of inpatient psychiatric personnel.   Kristine LineaJolanta Habib Kise, MD 05/22/2016, 2:42 PM

## 2016-05-23 MED ORDER — AMANTADINE HCL 100 MG PO CAPS
100.0000 mg | ORAL_CAPSULE | Freq: Two times a day (BID) | ORAL | Status: DC
  Administered 2016-05-23 – 2016-05-30 (×14): 100 mg via ORAL
  Filled 2016-05-23 (×15): qty 1

## 2016-05-23 MED ORDER — CYANOCOBALAMIN 1000 MCG/ML IJ SOLN
1000.0000 ug | Freq: Every day | INTRAMUSCULAR | Status: DC
Start: 2016-05-23 — End: 2016-05-30
  Administered 2016-05-23 – 2016-05-30 (×8): 1000 ug via SUBCUTANEOUS
  Filled 2016-05-23 (×8): qty 1

## 2016-05-23 MED ORDER — LORAZEPAM 0.5 MG PO TABS
0.5000 mg | ORAL_TABLET | Freq: Three times a day (TID) | ORAL | Status: DC
  Administered 2016-05-23 – 2016-05-30 (×22): 0.5 mg via ORAL
  Filled 2016-05-23 (×22): qty 1

## 2016-05-23 MED ORDER — PALIPERIDONE ER 3 MG PO TB24
9.0000 mg | ORAL_TABLET | Freq: Every day | ORAL | Status: DC
  Administered 2016-05-23: 9 mg via ORAL
  Filled 2016-05-23: qty 3

## 2016-05-23 NOTE — Plan of Care (Signed)
Problem: Health Behavior/Discharge Planning: Goal: Compliance with prescribed medication regimen will improve Outcome: Progressing Remains medication compliant. Mouth check done. No cheecking noted.

## 2016-05-23 NOTE — Progress Notes (Signed)
Recreation Therapy Notes  Date: 08.15.17 Time: 3:00 pm Location: Craft Room  Group Topic: Goal Setting  Goal Area(s) Addresses:  Patient will write at least one goal. Patient will write at least one obstacle.  Behavioral Response: Did not attend  Intervention: Recovery Goal Chart  Activity: Patients were instructed to make a Recovery Goal Chart including goals, obstacles, the date they started working on their goals, and the date they achieved their goals.  Education: LRT educated patients on healthy ways to celebrate reaching their goals.  Education Outcome: Patient did not attend group.   Clinical Observations/Feedback: Patient did not attend group.  Lysa Livengood M, LRT/CTRS 05/23/2016 3:58 PM 

## 2016-05-23 NOTE — Progress Notes (Signed)
Patient observed to be bizarre(walked backwards, closed eyes while standing in the med room, made gestures). He stated that he was tired. He came out for meals and medications but did not attend groups. Remains isolative. Vague in thought process and kept asking when he is getting discharged. Frequent verbal contacts made. Encouraged to come out of room and interact but he refused. Will continue to monitor patient on checks for safety.

## 2016-05-23 NOTE — [facility] Group Notes (Signed)
[facility] Group Notes:  (Nursing/MHT/Case Management/Adjunct)  Date:  05/23/2016  Time:  2:23 PM  Type of Therapy:  Psychoeducational Skills  Participation Level:  Did Not Attend  Lynelle SmokeCara [facility] Center[facility]oni 05/23/2016, 2:23 PM

## 2016-05-23 NOTE — Plan of Care (Signed)
Problem: Health Behavior/Discharge Planning: Goal: Compliance with therapeutic regimen will improve Outcome: Progressing Patient medication compliant.   
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
## 2016-05-23 NOTE — [facility] Group Notes (Signed)
[facility] LCSW Group Therapy  05/23/2016 4:11 PM  Did not attend groups today due to acuity  Glennon MacSara P Horst Ostermiller 05/23/2016, 4:11 PM

## 2016-05-24 MED ORDER — PALIPERIDONE ER 3 MG PO TB24
12.0000 mg | ORAL_TABLET | Freq: Every day | ORAL | Status: DC
  Administered 2016-05-24 – 2016-05-29 (×6): 12 mg via ORAL
  Filled 2016-05-24 (×6): qty 4

## 2016-05-24 NOTE — [facility] Group Notes (Signed)
[facility] Group Notes:  (Nursing/MHT/Case Management/Adjunct)  Date:  05/24/2016  Time:  6:05 AM  Type of Therapy:  Group Therapy  Participation Level:  Active  Participation Quality:  Appropriate  Affect:  Appropriate  Cognitive:  Appropriate  Insight:  Appropriate  Engagement in Group:  Engaged  Modes of Intervention:  Limit-setting  Summary of Progress/Problems:  Adrian EkJanice Warner [name] 05/24/2016, 6:05 AM
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
## 2016-05-24 NOTE — Tx Team (Signed)
Interdisciplinary Treatment Plan Update (Adult)         Date: 05/24/2016   Time Reviewed: 10:30 AM   Progress in Treatment: Improving Attending groups: Yes  Participating in groups: Yes  Taking medication as prescribed: Yes  Tolerating medication: Yes  Family/Significant other contact made: CSW spoke with aunt, [name] Patient understands diagnosis: Yes  Discussing patient identified problems/goals with staff: Yes  Medical problems stabilized or resolved: Yes  Denies suicidal/homicidal ideation: Yes  Issues/concerns per patient self-inventory: Yes  Other:   New problem(s) identified: N/A   Discharge Plan or Barriers: see below   Reason for Continuation of Hospitalization:   Depression   Anxiety   Medication Stabilization   Comments: N/A   Estimated length of stay: 7 days    Patient is a 25 year old  male admitted for psychosis. Patient lives in [place], [place]. 25-year-old male with history of schizophrenia presented to [facility] under  IVC  from [facility]. He was taken to [facility] by his and for mental health help. She reported to ER that the patient was voicing suicidal ideations as well as homicidal ideations.  Patient will benefit from crisis stabilization, medication evaluation, group therapy, and psycho education in addition to case management for discharge planning. Patient and CSW reviewed pt's identified goals and treatment plan. Pt verbalized understanding and agreed to treatment plan.    Review of initial/current patient goals per problem list:  1. Goal(s): Patient will participate in aftercare plan   Met: Goal Progressing    Target date: 3-5 days post admission date   As evidenced by: Patient will participate within aftercare plan AEB aftercare provider and housing plan at discharge being identified.   CSW will schedule walk-in appointment with [facility] at discharge.  2. Goal (s): Patient will exhibit decreased depressive symptoms and suicidal  ideations.   Met: Goal progressing   Target date: 3-5 days post admission date   As evidenced by: Patient will utilize self-rating of depression at 3 or below and demonstrate decreased signs of depression or be deemed stable for discharge by MD.   05/12/16: Pt is acute at this time. Cannot report a depression score at this time. 05/17/16: Patient repeatedly stated that "he needs help" and "nothing is helping"  05/19/16: Pt is coming out of his room more, states that he is feeling better and reports a depression score of 4 at this time. 05/24/16: Pt is coming to group and is talking more to providers.  3. Goal(s): Patient will demonstrate decreased signs and symptoms of anxiety.   Met: Goal progressing   Target date: 3-5 days post admission date   As evidenced by: Patient will utilize self-rating of anxiety at 3 or below and demonstrated decreased signs of anxiety, or be deemed stable for discharge by MD   05/12/16: Pt is too acute this time; reports an anxiety score of 6 at this time. 05/17/16: Pt reports an anxiety score of 6 at this time. Pt is paranoid at this time - will only leave room for meals.  05/19/16: Pt is attending groups and reports an anxiety score of 5 at this time.  05/24/16: Pt reports an anxiety score of 4 at this time.  4. Goal(s): Patient will demonstrate decreased signs of psychosis  * Met:  Goal progressing  * Target date: 3-5 days post admission date  * As evidenced by: Patient will demonstrate decreased frequency of AVH or return to baseline function   05/12/16: Pt experiencing psychosis at   this time. Pt extremely paranoid at this time. 05/17/16: Pt is still presenting with signs of psychosis at this time. Extremely paranoid and believes people are out to get him. 05/19/16: Pt denies AVH at this time but still is experiencing some paranoia symptoms. 05/24/16: Pt has a lot of thought blocking, cannot talk properly and is still paranoid.   Attendees:  Patient: Adrian  Warner Family:  Physician: Andrea Hernandez, MD    05/24/2016 10:30 AM  Nursing: Gwen Farrish , RN     05/24/2016 10:30 AM  Clinical Social Worker:  R. , LCSWA  05/24/2016 10:30 AM  Recreational Therapist: Beth Greene                         05/17/2016 10:30 AM  

## 2016-05-24 NOTE — Progress Notes (Signed)
D: Pt remains isolative this evening. He requires much encouragement from writer to come to medication room for his medications. Pt stares at medications and appears to have trouble swallowing whole pills. Mouth checked done. No cheeking noted. Pt tells writer that he missed dinner and needs reassurance that it was documented that he ate his meals today. Denies SI/HI/AVH at this time. A: Emotional support and encouragement provided. Pt encouraged to attend groups. Medications administered with education. q15 minute safety checks maintained. R: Pt remains free from harm. Will continue to monitor.

## 2016-05-24 NOTE — Progress Notes (Signed)
Recreation Therapy Notes  Date: 08.16.17 Time: 1:00 pm Location: Craft Room  Group Topic: Self-esteem  Goal Area(s) Addresses:  Patient will write at least one positive trait. Patient will verbalize benefit of having a healthy self-esteem.  Behavioral Response: Arrived late, Intermittently Attentive   Intervention: I Am  Activity: Patients were given a worksheet with the letter I on it and instructed to write as many positive traits inside the letter.  Education: LRT educated patients on ways they can increase their self-esteem.  Education Outcome: In group clarification offered.   Clinical Observations/Feedback: Patient arrived to group at approximately 1:24 pm. LRT educated patient on activity. Patient did not participate in activity. Patient did not contribute to group discussion.  Jacquelynn CreeGreene,Fynn Vanblarcom M, LRT/CTRS 05/24/2016 2:51 PM

## 2016-05-24 NOTE — [facility] Group Notes (Signed)
[facility]  05/24/2016 3:08 PM  Did not attend.  Glennon MacSara P Nahmir Zeidman, MSW, LCSW 05/24/2016, 3:08 PM

## 2016-05-24 NOTE — Plan of Care (Signed)
Problem: Coping: Goal: Ability to verbalize feelings will improve Outcome: Progressing Pt reports that he is "really sad", although he does not specify what brought about these feelings.  Problem: Nutritional: Goal: Ability to achieve adequate nutritional intake will improve Outcome: Progressing Pt eating meals and snacks.

## 2016-05-24 NOTE — Plan of Care (Signed)
Problem: Coping: Goal: Ability to verbalize feelings will improve Outcome: Progressing Pt remains paranoid and cautious with interaction. He forwards little information regarding feelings despite encouragement from Clinical research associatewriter.  Problem: Health Behavior/Discharge Planning: Goal: Compliance with prescribed medication regimen will improve Outcome: Progressing Pt takes medications as prescribed. Writer ensures that pt does not cheek medications.

## 2016-05-24 NOTE — Progress Notes (Addendum)
Patient with sad affect, cooperative behavior with meal and meds. Patient eats am meal and presents for am meds. Patient takes Ativan and is unable to swallow Amantadine, stating " I have a hard time swallowing pills". Patient states he wants to discharge home. No SI/HI and safety maintained.

## 2016-05-25 NOTE — Plan of Care (Signed)
Problem: Activity: Goal: Interest or engagement in leisure activities will improve Outcome: Progressing Pt attends groups during the day and goes outside with peers this evening.  Problem: Safety: Goal: Ability to remain free from injury will improve Outcome: Progressing Pt remains free from harm.
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
## 2016-05-25 NOTE — [facility] Group Notes (Signed)
[facility] LCSW Group Therapy  05/25/2016 3:28 PM  Type of Therapy:  Group Therapy  Participation Level:  Active  Participation Quality:  Attentive  Affect:  Appropriate  Cognitive:  Alert  Insight:  Improving  Engagement in Therapy:  Improving  Modes of Intervention:  Discussion, Education and Support  Summary of Progress/Problems: Balance in life: Patients will discuss the concept of balance and how it looks and feels to be unbalanced. Pt will identify areas in their life that is unbalanced and ways to become more balanced. Pt attended group on this date and actively shared in the group discussion. Pt stated he wanted to pursue his GED when discharged to allow more employment opportunities.    Leeann Bady G. Garnette CzechSampson MSW, LCSWA 05/25/2016, 3:28 PM

## 2016-05-25 NOTE — Progress Notes (Signed)
Recreation Therapy Notes  Date: 08.17.17 Time: 9:30 am Location: Craft Room  Group Topic: Leisure Education  Goal Area(s) Addresses:  Patient will identify things they are grateful for.  Patient will be educated on why it is important to be grateful.  Behavioral Response: Did not attend  Intervention: Grateful Wheel  Activity: Patients were given an I Am Grateful For worksheet and instructed to write things they are grateful for under each category.  Education: LRT educated patients on why it is important to be grateful.  Education Outcome: Patient did not attend group.   Clinical Observations/Feedback: Patient did not attend group.  Jacquelynn CreeGreene,Jyllian Haynie M, LRT/CTRS 05/25/2016 10:26 AM

## 2016-05-25 NOTE — Plan of Care (Signed)
Problem: Activity: Goal: Imbalance in normal sleep/wake cycle will improve Outcome: Progressing Encouraged pt to stay up during the day, to get out of his room more. Attended group. Slight improvement. Will continue to monitor.

## 2016-05-25 NOTE — Progress Notes (Signed)
D: Pt is seen in the milieu more this evening. He does come out of his room for evening snack. Pt takes medications as prescribed and asks questions appropriately. Pt reports that he is "really sad." Denies SI/HI at this time. When asked if he is experiencing AVH, he initially says "yes" then changes his response. Pt verbalizes that he is "scared of people." Pt does not specify whether he is scared of people in the hospital or outside. Pt sees another pt in a wheelchair and states "I need a wheelchair, I can't walk." Pt then begins shuffling and refusing to talk while making gestures that he is unable to do either. Pt reports that he needs "help." A: Emotional support and encouragement provided. Pt redirected to his room where he showers and goes to sleep shortly after. Medications administered with education. q15 minute safety checks maintained. R: Pt remains free from harm. Will continue to monitor.

## 2016-05-25 NOTE — [facility] Group Notes (Signed)
[facility] Group Notes:  (Nursing/MHT/Case Management/Adjunct)  Date:  05/25/2016  Time:  4:22 PM  Type of Therapy:  Psychoeducational Skills  Participation Level:  Did Not Attend  Twanna Hymanda C Edy Belt 05/25/2016, 4:22 PM

## 2016-05-25 NOTE — [facility] Group Notes (Signed)
[facility] LCSW Group Therapy Note  Type of Therapy and Topic:  Group Therapy:  Goals Group: SMART Goals  Participation Level:  Pt did not attend group. CSW invited pt to group.   Description of Group:    The purpose of a daily goals group is to assist and guide patients in setting recovery/wellness-related goals.  The objective is to set goals as they relate to the crisis in which they were admitted. Patients will be using SMART goal modalities to set measurable goals.  Characteristics of realistic goals will be discussed and patients will be assisted in setting and processing how one will reach their goal. Facilitator will also assist patients in applying interventions and coping skills learned in psycho-education groups to the SMART goal and process how one will achieve defined goal.  Therapeutic Goals: -Patients will develop and document one goal related to or their crisis in which brought them into treatment. -Patients will be guided by LCSW using SMART goal setting modality in how to set a measurable, attainable, realistic and time sensitive goal.  -Patients will process barriers in reaching goal. -Patients will process interventions in how to overcome and successful in reaching goal.   Summary of Patient Progress:  Patient Goal: Pt did not attend group. CSW invited pt to group.   Therapeutic Modalities:   Motivational Interviewing  Engineer, manufacturing systemsCognitive Behavioral Therapy Crisis Intervention Model SMART goals setting  Jerson Furukawa G. Garnette CzechSampson MSW, LCSWA 05/25/2016 11:55 AM

## 2016-05-25 NOTE — Progress Notes (Signed)
D: Improvement noted with pt participation in group and coming out of his room into the milieu. Pt engaged in conversation with staff reporting he is sad and when encouraged to get out of his room more, he reports that he feels "it's like I want to get up and go to group, but I cant. I cant walk."   A: Support and encouragement provided. Medication administered as ordered. Safety checks every 15 minutes.   R: Safety maintained. Medication compliant. Attended group. Improving.

## 2016-05-26 MED ORDER — PALIPERIDONE PALMITATE 234 MG/1.5ML IM SUSP
234.0000 mg | Freq: Once | INTRAMUSCULAR | Status: AC
  Administered 2016-05-26: 234 mg via INTRAMUSCULAR
  Filled 2016-05-26: qty 1.5

## 2016-05-26 NOTE — Progress Notes (Signed)
D: Pt is pleasant and cooperative this evening. He attends group outside with staff and peers. Pt reports today was "the same as usual." Denies SI/HI/AVH at this time. Pt continues to ask for a wheelchair, stating that he can't walk. Gait is steady. Pt asks writer to take his pulse because "I think it's really high." VS WNL. A: Emotional support and encouragement provided. Medications administered with education. q15 minute safety checks maintained. R: Pt remains free from harm. Will continue to monitor.

## 2016-05-26 NOTE — [facility] Group Notes (Signed)
[facility] Group Notes:  (Nursing/MHT/Case Management/Adjunct)  Date:  05/26/2016  Time:  4:24 AM  Type of Therapy:  Evening Wrap-up Group  Participation Level:  Minimal  Participation Quality:  Appropriate  Affect:  Appropriate  Cognitive:  Alert and Appropriate  Insight:  Appropriate  Engagement in Group:  Engaged  Modes of Intervention:  Activity  Summary of Progress/Problems:  Tomasita MorrowChelsea Nanta Jamorris Ndiaye 05/26/2016, 4:24 AM

## 2016-05-26 NOTE — Tx Team (Signed)
Interdisciplinary Treatment Plan Update (Adult)         Date: 05/26/2016   Time Reviewed: 10:30 AM   Progress in Treatment: Improving Attending groups: Yes  Participating in groups: Yes  Taking medication as prescribed: Yes  Tolerating medication: Yes  Family/Significant other contact made: CSW spoke with aunt, [name] Patient understands diagnosis: Yes  Discussing patient identified problems/goals with staff: Yes  Medical problems stabilized or resolved: Yes  Denies suicidal/homicidal ideation: Yes  Issues/concerns per patient self-inventory: Yes  Other:   New problem(s) identified: N/A   Discharge Plan or Barriers: see below   Reason for Continuation of Hospitalization:   Depression   Anxiety   Medication Stabilization   Comments: N/A   Estimated length of stay: 7 days    Patient is a 25 year old  male admitted for psychosis. Patient lives in [place], [place]. 25 year old male with history of schizophrenia presented to [facility]  under  IVC  from [place]. He was taken to [facility] by his and for mental health help. She reported to ER that the patient was voicing suicidal ideations as well as homicidal ideations.  Patient will benefit from crisis stabilization, medication evaluation, group therapy, and psycho education in addition to case management for discharge planning. Patient and CSW reviewed pt's identified goals and treatment plan. Pt verbalized understanding and agreed to treatment plan.    Review of initial/current patient goals per problem list:  1. Goal(s): Patient will participate in aftercare plan   Met: Goal Progressing    Target date: 3-5 days post admission date   As evidenced by: Patient will participate within aftercare plan AEB aftercare provider and housing plan at discharge being identified.   CSW will schedule walk-in appointment with [facility] at discharge.  2. Goal (s): Patient will exhibit decreased depressive symptoms and suicidal  ideations.   Met: Yes  Target date: 3-5 days post admission date   As evidenced by: Patient will utilize self-rating of depression at 3 or below and demonstrate decreased signs of depression or be deemed stable for discharge by MD.   05/12/16: Pt is acute at this time. Cannot report a depression score at this time. 05/17/16: Patient repeatedly stated that "he needs help" and "nothing is helping"  05/19/16: Pt is coming out of his room more, states that he is feeling better and reports a depression score of 4 at this time. 05/24/16: Pt is coming to group and is talking more to providers. 05/26/16: Pt is anxious to leave - reports no depression or suicidal ideation.   3. Goal(s): Patient will demonstrate decreased signs and symptoms of anxiety.   Met: Goal progressing   Target date: 3-5 days post admission date   As evidenced by: Patient will utilize self-rating of anxiety at 3 or below and demonstrated decreased signs of anxiety, or be deemed stable for discharge by MD   05/12/16: Pt is too acute this time; reports an anxiety score of 6 at this time. 05/17/16: Pt reports an anxiety score of 6 at this time. Pt is paranoid at this time - will only leave room for meals.  05/19/16: Pt is attending groups and reports an anxiety score of 5 at this time.  05/24/16: Pt reports an anxiety score of 4 at this time. 05/26/16: Pt reports an anxiety score of 4 at this time.  4. Goal(s): Patient will demonstrate decreased signs of psychosis  * Met:  Goal progressing  * Target date: 3-5 days post admission date  *  As evidenced by: Patient will demonstrate decreased frequency of AVH or return to baseline function   05/12/16: Pt experiencing psychosis at this time. Pt extremely paranoid at this time. 05/17/16: Pt is still presenting with signs of psychosis at this time. Extremely paranoid and believes people are out to get him. 05/19/16: Pt denies AVH at this time but still is experiencing some paranoia  symptoms. 05/24/16: Pt has a lot of thought blocking, cannot talk properly and is still paranoid.  05/26/16: Pt is still experiencing thought blocking and catatonic behaviors.    Attendees:  Patient: [name] Family:  Physician: Merlyn Albert, MD    05/26/2016 10:30AM  Nursing: Elige Radon , RN     05/26/2016 10:30AM  Clinical Social Worker: Emilie Rutter, [place]  05/26/2016 10:30AM  Recreational Therapist: Everitt Amber                         05/17/2016 10:30 AM

## 2016-05-26 NOTE — Progress Notes (Signed)
Recreation Therapy Notes  Date: 08.18.17 Time: 1:00 pm Location: Craft Room  Group Topic: Self-expression, Coping Skills  Goal Area(s) Addresses:  Patient will effectively use art as a means of self-expression. Patient will recognize positive benefit of self-expression. Patient will be able to identify one emotion experienced during group session. Patient will identify use of art as a coping skill.  Behavioral Response: Did not attend  Intervention: Two Faces of Me  Activity: Patients were given a blank face worksheet and instructed to draw a line down the middle. On one side of the face, patients were instructed to draw or write how they felt when they were admitted to the hospital. On the other side of the face, patients were instructed to draw or write how they want to feel when they are d/c.  Education: LRT educated patients on healthy coping skills.  Education Outcome: Patient did not attend group.   Clinical Observations/Feedback: Patient did not attend group.  Jacquelynn CreeGreene,Merritt Kibby M, LRT/CTRS 05/26/2016 3:02 PM

## 2016-05-26 NOTE — Progress Notes (Signed)
D: Pt is pleasant and cooperative with care. Med and group compliant.  Denies SI/HI/AVH at this time.  A: Emotional support and encouragement provided. Medications administered with education. q15 minute safety checks maintained. R: Pt remains free from harm. Will continue to monitor
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
## 2016-05-26 NOTE — [facility] Group Notes (Signed)
[facility] LCSW Group Therapy  05/26/2016 11:37 AM  Type of Therapy:  Group Therapy  Participation Level:  Pt did not attend group. CSW invited pt to group.   Summary of Progress/Problems:Feelings around Relapse. Group members discussed the meaning of relapse and shared personal stories of relapse, how it affected them and others, and how they perceived themselves during this time. Group members were encouraged to identify triggers, warning signs and coping skills used when facing the possibility of relapse. Social supports were discussed and explored in detail. Patients also discussed facing disappointment and how that can trigger someone to relapse.   Octavia Mottola G. Garnette CzechSampson MSW, LCSWA 05/26/2016, 11:37 AM

## 2016-05-26 NOTE — Plan of Care (Signed)
Problem: Safety: Goal: Ability to remain free from injury will improve Outcome: Progressing No self harm reported or observed   

## 2016-05-26 NOTE — [facility] Group Notes (Signed)
[facility] Group Notes:  (Nursing/MHT/Case Management/Adjunct)  Date:  05/26/2016  Time:  4:53 PM  Type of Therapy:  Psychoeducational Skills  Participation Level:  Minimal  Participation Quality:  Attentive  Affect:  Flat  Cognitive:  Oriented  Insight:  Appropriate  Engagement in Group:  Limited  Modes of Intervention:  Discussion and Education  Summary of Progress/Problems:  [name] 05/26/2016, 4:53 PM
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
## 2016-05-27 NOTE — Progress Notes (Signed)
Pt has been seclusive to his room except for snack. Pt has been compliant with taking po meds. Pt denies SI and A/V hallucinations. Pt's mood and affect has been depressed. No inappropriate behaviors noted this shift thus far.

## 2016-05-27 NOTE — [facility] Group Notes (Signed)
[facility] LCSW Group Therapy  05/27/2016 3:29 PM  Type of Therapy:  Group Therapy  Participation Level:  Pt did not attend group. CSW invited pt to group.   Summary of Progress/Problems:Self esteem: Patients discussed self esteem and how it impacts them. They discussed what aspects in their lives has influenced their self esteem. They were challenged to identify changes that are needed in order to improve self esteem. Patients participated in activity where they had to identify positive adjectives they felt described their personality. Patients shared with the group on the following areas: Things I am good at, What I like about my appearance, I've helped others by, What I value the most, compliments I have received, challenges I have overcome, thing that make me unique, and Times I've made others happy.    [name] G. Garnette CzechSampson MSW, LCSWA 05/27/2016, 3:29 PM

## 2016-05-27 NOTE — [facility] Group Notes (Signed)
[facility] Group Notes:  (Nursing/MHT/Case Management/Adjunct)  Date:  05/27/2016  Time:  5:19 AM  Type of Therapy:  Psychoeducational Skills  Participation Level:  Active  Participation Quality:  Appropriate and Attentive  Affect:  Appropriate  Cognitive:  Appropriate  Insight:  Appropriate, Good and Improving  Engagement in Group:  Engaged and Improving  Modes of Intervention:  Discussion, Socialization and Support  Summary of Progress/Problems:  [name] 05/27/2016, 5:19 AM
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
## 2016-05-27 NOTE — Progress Notes (Signed)
D:Pt is pleasant and cooperative with care. Medication compliant. Attended evening group  Denies SI/HI/AVH at this time. Pleasant during interaction. No somatic complaints. A: Emotional support and encouragement provided. Medications administered as prescribed. q15 minute  checks maintained for safety. R:Pt remains free from harm. Voices no additional concerns at this time.Will continue to monitor.

## 2016-05-27 NOTE — Progress Notes (Signed)
Pt has been seclusive to his room. Pt has been compliant with taking po meds. Pt denies SI and A/V hallucinations. Pt's mood and affect has been depressed. No inappropriate behaviors noted this period. 

## 2016-05-28 NOTE — Plan of Care (Signed)
Problem: Activity: Goal: Interest or engagement in leisure activities will improve Outcome: Progressing Attended and participated in group today to include going outside and karaoke. Appropriately interacted with peers and staff today. Improvement noted. Will continue to monitor.

## 2016-05-28 NOTE — [facility] Group Notes (Signed)
[facility] LCSW Group Therapy  05/28/2016 2:58 PM  Type of Therapy:  Group Therapy  Participation Level:  Pt did not attend group. CSW invited pt to group.   Summary of Progress/Problems:Coping Skills: Patients defined and discussed healthy coping skills. Patients identified healthy coping skills they would like to try during hospitalization and after discharge. CSW offered insight to varying coping skills that may have been new to patients such as practicing mindfulness.  Shiana Rappleye G. Garnette CzechSampson MSW, LCSWA 05/28/2016, 2:58 PM

## 2016-05-28 NOTE — [facility] Group Notes (Signed)
[facility] Group Notes:  (Nursing/MHT/Case Management/Adjunct)  Date:  05/28/2016  Time:  5:03 AM  Type of Therapy:  Psychoeducational Skills  Participation Level:  Active  Participation Quality:  Appropriate  Affect:  Appropriate  Cognitive:  Appropriate  Insight:  Appropriate  Engagement in Group:  Engaged  Modes of Intervention:  Discussion, Socialization and Support  Summary of Progress/Problems:  Adrian MilroyLaquanda Y Liany Warner 05/28/2016, 5:03 AM
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
## 2016-05-28 NOTE — Progress Notes (Signed)
D: Improvement continued with pt out of room most of the day in dayroom interacting appropriately with peers. Pt continues to report he is ready to go home. Appropriately interacted with staff without reports of physical issues today. Denies SI/HI/AVH.   A: Medication administered as ordered. Support and encouragement provided. Every 15 minutes checks for safety.   R: Attended and participated in group activity. Medication compliant. Safety maintained.

## 2016-05-29 NOTE — Progress Notes (Signed)
Recreation Therapy Notes  Date: 08.21.17 Time: 9:30 am Location: Craft Room  Group Topic: Self-expression  Goal Area(s) Addresses:  Patient will identify one color per emotion listed on wheel. Patient will verbalize benefit of using art as a means of self-expression. Patient will verbalize one emotion experienced during session. Patient will be educated on other forms of self-expression.  Behavioral Response: Intermittently Attentive, Intermittently Interactive  Intervention: Emotion Wheel  Activity: Patients were given an Emotion Wheel worksheet and instructed to pick a color for each emotion listed on the wheel.  Education: LRT educated patients on other forms of self-expression.  Education Outcome: In group clarification offered  Clinical Observations/Feedback: Patient completed activity by picking a color for each emotion. Patient contributed to group discussion by stating what colors he picked for certain emotions and why.  Jacquelynn CreeGreene,[name], LRT/CTRS 05/29/2016 10:36 AM

## 2016-05-29 NOTE — [facility] Group Notes (Signed)
[facility] Group Notes:  (Nursing/MHT/Case Management/Adjunct)  Date:  05/29/2016  Time:  3:45 AM  Type of Therapy:  Group Therapy  Participation Level:  Minimal  Participation Quality:  Drowsy  Affect:  Flat  Cognitive:  Alert  Insight:  Lacking  Engagement in Group:  Lacking  Modes of Intervention:  Discussion  Summary of Progress/Problems: Staff asked pt about his goal, pt replied "I don't even know" repeatedly. Staff tried to redirect pt into a conversation staff and the pt had had prior. Pt. Laughed inappropriately and again stated "I don't even know."   Veva Holesshley Imani Margaret Cockerill 05/29/2016, 3:45 AM
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
## 2016-05-29 NOTE — [facility] Group Notes (Signed)
[facility] LCSW Group Therapy   05/29/2016 1pm Type of Therapy: Group Therapy   Participation Level: Active   Participation Quality: Attentive, Sharing and Supportive   Affect: Depressed and Flat   Cognitive: Alert and Oriented   Insight: Developing/Improving and Engaged   Engagement in Therapy: Developing/Improving and Engaged   Modes of Intervention: Clarification, Confrontation, Discussion, Education, Exploration,  Limit-setting, Orientation, Problem-solving, Rapport Building, Dance movement psychotherapisteality Testing, Socialization and Support   Summary of Progress/Problems: Pt identified obstacles faced currently and processed barriers involved in overcoming these obstacles. Pt identified steps necessary for overcoming these obstacles and explored motivation (internal and external) for facing these difficulties head on. Pt further identified one area of concern in their lives and chose a goal to focus on for today.  When asked his primary coping mechanism the pt replies, "I just don't know".  Pt declined to share further and just "laughed" when asked to share, but was otherwise polite and cooperative with the CSW and other group members and focused and attentive to the topics discussed and the sharing of others.   Dorothe PeaJonathan F. Cartha Rotert, LCSWA, LCAS

## 2016-05-29 NOTE — Plan of Care (Signed)
Problem: Pain Managment: Goal: General experience of comfort will improve Outcome: Progressing Patient denies pain at this time   

## 2016-05-29 NOTE — Progress Notes (Signed)
Patient remains bizarre. He sat on the floor at the med room, he stated that he could not walk. He however sprung up and took his medications. He also had other complains such as, "I cant see, I cant move." He also stopped mid way in the hallway as if lost and was redirected back to his room. No falls. Will continue to monitor Q15 min observation to ensure safety is maintained.

## 2016-05-29 NOTE — [facility] Group Notes (Signed)
[facility] Group Notes:  (Nursing/MHT/Case Management/Adjunct)  Date:  05/29/2016  Time:  11:15 PM  Type of Therapy:  Evening Wrap-up Group  Participation Level:  Did Not Attend  Participation Quality:  N/A  Affect:  N/A  Cognitive:  N/A  Insight:  None  Engagement in Group:  N/A  Modes of Intervention:  Activity and Discussion  Summary of Progress/Problems:  Adrian MorrowChelsea Warner [name] 05/29/2016, 11:15 PM

## 2016-05-29 NOTE — Progress Notes (Signed)
D: Patient still appears somewhat bizzare making statement such as, "do you have medication that's going to help me see." He also appears somewhat gamey. He has been isolative this evening. Denies SI/HI/AVH. Came out of his room for snack. A: Medications given with education. Encouragement provided.  R: Patient was compliant with medication. Patient has been calm and cooperative. Safety maintained with 15 min checks.

## 2016-05-29 NOTE — Progress Notes (Signed)
  D: Pt denies SI/HI/AVH, but noted responding to internal stimuli. Patient continues to be suspicious of staff, minimal interaction with staff and peers. Patient's thoughts are disorganized, delayed response and thought blocking noted during conversation. Patient appears less anxious.  A: Pt was offered support and encouragement. Pt was given scheduled medications. Pt was encouraged to attend groups. Q 15 minute checks were done for safety.  R:Pt attended evening group, but did not participate appropriately; he was unable to express himself. Pt is complaint with medication.Pt has no complaints.Pt receptive to treatment and safety maintained on unit.

## 2016-05-29 NOTE — Plan of Care (Signed)
Problem: Coping: Goal: Ability to verbalize feelings will improve Outcome: Progressing Patient attempted to express himself on several times but he experienced thought blocking.

## 2016-05-30 MED ORDER — PALIPERIDONE ER 6 MG PO TB24: 12.0000 mg | ORAL_TABLET | Freq: Every day | ORAL | 0 refills | Status: DC

## 2016-05-30 MED ORDER — PALIPERIDONE PALMITATE 234 MG/1.5ML IM SUSP: 234.0000 mg | INTRAMUSCULAR | Status: DC

## 2016-05-30 MED ORDER — PALIPERIDONE PALMITATE 234 MG/1.5ML IM SUSP: 234.0000 mg | INTRAMUSCULAR | 0 refills | Status: DC

## 2016-05-30 MED ORDER — PALIPERIDONE PALMITATE 156 MG/ML IM SUSP
156.0000 mg | Freq: Once | INTRAMUSCULAR | Status: AC
  Administered 2016-05-30: 156 mg via INTRAMUSCULAR
  Filled 2016-05-30: qty 1

## 2016-05-30 MED ORDER — AMANTADINE HCL 100 MG PO CAPS: 100.0000 mg | ORAL_CAPSULE | Freq: Two times a day (BID) | ORAL | 0 refills | Status: DC

## 2016-05-30 NOTE — Progress Notes (Signed)
  [facility] Discharge Plan :  Will you be returning to the same living situation after discharge:  Yes,  home with aunt At discharge, do you have transportation home?: Yes,  aunt will pick pt up Do you have the ability to pay for your medications: Yes,  [facility]  Release of information consent forms completed and in the chart;  Patient's signature needed at discharge.  Patient to Follow up at: Follow-up Information    [facility]. Go on 05/31/2016.   Why:  [facility] for your hospital follow-up appointment. It is impotant that you bring your discharge paperwork to this appointment. Arrive as early as possible for prompt services.  Contact information: Address: [address], [place], [place] [address] Hours: 8:30AM-5PM Phone: [phone]          Next level of care provider has access to [facility] Link:no  Safety Planning and Suicide Prevention discussed: Yes,  SPE reviewed with patient and aunt  Have you used any form of tobacco in the last 30 days? (Cigarettes, Smokeless Tobacco, Cigars, and/or Pipes): No  Has patient been referred to the Quitline?: N/A patient is not a smoker  Patient has been referred for addiction treatment: Pt. refused referral  Lynden OxfordKadijah R Nakiesha Rumsey, MSW, LCSW-A 05/30/2016, 12:00 PM
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
## 2016-05-30 NOTE — Tx Team (Signed)
Interdisciplinary Treatment Plan Update (Adult)         Date: 05/30/2016   Time Reviewed: 10:30 AM   Progress in Treatment: Improving Attending groups: Yes  Participating in groups: Yes  Taking medication as prescribed: Yes  Tolerating medication: Yes  Family/Significant other contact made: CSW spoke with aunt, [name] Patient understands diagnosis: Yes  Discussing patient identified problems/goals with staff: Yes  Medical problems stabilized or resolved: Yes  Denies suicidal/homicidal ideation: Yes  Issues/concerns per patient self-inventory: Yes  Other:   New problem(s) identified: N/A   Discharge Plan or Barriers: see below   Reason for Continuation of Hospitalization:   Depression   Anxiety   Medication Stabilization   Comments: N/A   Estimated length of stay: 7 days    Patient is a 25 year old  male admitted for psychosis. Patient lives in [place], [place]. 25 year old male with history of schizophrenia presented to [facility]  under  IVC  from [place]. He was taken to [facility] by his and for mental health help. She reported to ER that the patient was voicing suicidal ideations as well as homicidal ideations.  Patient will benefit from crisis stabilization, medication evaluation, group therapy, and psycho education in addition to case management for discharge planning. Patient and CSW reviewed pt's identified goals and treatment plan. Pt verbalized understanding and agreed to treatment plan.    Review of initial/current patient goals per problem list:  1. Goal(s): Patient will participate in aftercare plan   Met: Yes  Target date: 3-5 days post admission date   As evidenced by: Patient will participate within aftercare plan AEB aftercare provider and housing plan at discharge being identified.   Patient will follow-up with [facility].  2. Goal (s): Patient will exhibit decreased depressive symptoms and suicidal ideations.   Met: Yes  Target date: 3-5 days  post admission date   As evidenced by: Patient will utilize self-rating of depression at 3 or below and demonstrate decreased signs of depression or be deemed stable for discharge by MD.   05/12/16: Pt is acute at this time. Cannot report a depression score at this time. 05/17/16: Patient repeatedly stated that "he needs help" and "nothing is helping"  05/19/16: Pt is coming out of his room more, states that he is feeling better and reports a depression score of 4 at this time. 05/24/16: Pt is coming to group and is talking more to providers. 05/26/16: Pt is anxious to leave - reports no depression or suicidal ideation.  05/30/16: Adequate for discharge per MD.   3. Goal(s): Patient will demonstrate decreased signs and symptoms of anxiety.   Met: Goal progressing   Target date: 3-5 days post admission date   As evidenced by: Patient will utilize self-rating of anxiety at 3 or below and demonstrated decreased signs of anxiety, or be deemed stable for discharge by MD   05/12/16: Pt is too acute this time; reports an anxiety score of 6 at this time. 05/17/16: Pt reports an anxiety score of 6 at this time. Pt is paranoid at this time - will only leave room for meals.  05/19/16: Pt is attending groups and reports an anxiety score of 5 at this time.  05/24/16: Pt reports an anxiety score of 4 at this time. 05/26/16: Pt reports an anxiety score of 4 at this time. 05/30/16: Patient reports of feeling much better - no anxiety symptoms. Adequate for discharge per MD.   4. Goal(s): Patient will demonstrate decreased signs of  psychosis  * Met:  Yes * Target date: 3-5 days post admission date  * As evidenced by: Patient will demonstrate decreased frequency of AVH or return to baseline function   05/12/16: Pt experiencing psychosis at this time. Pt extremely paranoid at this time. 05/17/16: Pt is still presenting with signs of psychosis at this time. Extremely paranoid and believes people are out to get  him. 05/19/16: Pt denies AVH at this time but still is experiencing some paranoia symptoms. 05/24/16: Pt has a lot of thought blocking, cannot talk properly and is still paranoid.  05/26/16: Pt is still experiencing thought blocking and catatonic behaviors. 05/30/16: Pt shows no signs or symptoms of AVH at this time. Adequate for discharge per MD.    Attendees:  Patient: [name] Family:  Physician: Merlyn Albert, MD    05/30/2016 10:30AM  Nursing: Polly Cobia , RN     05/30/2016 10:30AM  Clinical Social Worker: Emilie Rutter, [place]  05/30/2016 10:30AM  Recreational Therapist: Everitt Amber                         05/30/2016 10:30 AM

## 2016-05-30 NOTE — Progress Notes (Signed)
Recreation Therapy Notes  Date: 08.22.17 Time: 9:30 am Location: Craft Room  Group Topic: Goal Setting  Goal Area(s) Addresses:  Patient will write one goal. Patient will write at least one supportive statement.  Behavioral Response: Attentive, Interactive   Intervention: Step By Step  Activity: Patients were given a foot worksheet and instructed to write a goal inside the foot. On the outside of the foot, patients were instructed to write supportive statements to help them reach their goals.  Education: LRT educated patients on healthy ways to celebrate reaching their goals.  Education Outcome: Acknowledges education/In group clarification offered  Clinical Observations/Feedback: Patient completed activity by writing a goal and supportive statements. Patient contributed to group discussion by stating ways he can celebrate reaching his goals in a healthy way.  Jacquelynn CreeGreene,Ilene Witcher M, LRT/CTRS 05/30/2016 10:28 AM

## 2016-05-30 NOTE — [facility] Suicide Risk Assessment (Signed)
[facility]   Principal Problem: Schizophreniform disorder [facility](HCC) Discharge Diagnoses:  Patient Active Problem List   Diagnosis Date Noted  . 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 Cannabis use disorder, moderate, dependence (HCC) [F12.20] 05/10/2016      Psychiatric Specialty Exam: ROS  Blood pressure 134/86, pulse 85, temperature 98.6 F (37 C), temperature source Oral, resp. rate 20, height 5\' 5"  (1.651 m), weight 72.8 kg (160 lb 8 oz), SpO2 100 %.Body mass index is 26.71 kg/m.                                                       Mental Status Per Nursing Assessment::   On Admission:     Demographic Factors:  Male, Low socioeconomic status and Unemployed  Loss Factors: Legal issues  Historical Factors: Impulsivity  Risk Reduction Factors:   Sense of responsibility to family and Positive social support  Continued Clinical Symptoms:  Alcohol/Substance Abuse/Dependencies Schizophrenia:   Less than 24 years old  Cognitive Features That Contribute To Risk:  Loss of executive function    Suicide Risk:  Minimal: No identifiable suicidal ideation.  Patients presenting with no risk factors but with morbid ruminations; may be classified as minimal risk based on the severity of the depressive symptoms    Jimmy FootmanHernandez-Gonzalez,  Moani Weipert, MD 05/30/2016, 9:18 AM

## 2016-05-30 NOTE — Progress Notes (Signed)
Patient's discharge instructions, medications and follow-up appointments discussed with the patient. He verbalized understanding. Patient's property was given back to him, he did not voice any concerns. He was escorted to the lobby where his aunt was waiting for him.

## 2016-06-02 ENCOUNTER — Emergency Department (HOSPITAL_COMMUNITY)
Admission: EM | Admit: 2016-06-02 | Discharge: 2016-06-04 | Disposition: A | Discharge status: Home / Self Care | Payer: Medicaid Other | Attending: Emergency Medicine | Admitting: Emergency Medicine

## 2016-06-02 ENCOUNTER — Encounter (HOSPITAL_COMMUNITY): Payer: Self-pay | Admitting: *Deleted

## 2016-06-02 DIAGNOSIS — F1290 Cannabis use, unspecified, uncomplicated: Secondary | ICD-10-CM | POA: Diagnosis not present

## 2016-06-02 DIAGNOSIS — Z046 Encounter for general psychiatric examination, requested by authority: Secondary | ICD-10-CM | POA: Diagnosis not present

## 2016-06-02 DIAGNOSIS — Z79899 Other long term (current) drug therapy: Secondary | ICD-10-CM | POA: Insufficient documentation

## 2016-06-02 DIAGNOSIS — F2081 Schizophreniform disorder: Secondary | ICD-10-CM

## 2016-06-02 DIAGNOSIS — F209 Schizophrenia, unspecified: Secondary | ICD-10-CM | POA: Diagnosis present

## 2016-06-02 DIAGNOSIS — F1220 Cannabis dependence, uncomplicated: Secondary | ICD-10-CM

## 2016-06-02 DIAGNOSIS — F17200 Nicotine dependence, unspecified, uncomplicated: Secondary | ICD-10-CM | POA: Diagnosis not present

## 2016-06-02 DIAGNOSIS — F919 Conduct disorder, unspecified: Secondary | ICD-10-CM | POA: Diagnosis not present

## 2016-06-02 LAB — CBC WITH DIFFERENTIAL/PLATELET
Basophils Absolute: 0 10*3/uL (ref 0.0–0.1)
Basophils Relative: 0 %
Eosinophils Absolute: 0.3 10*3/uL (ref 0.0–0.7)
Eosinophils Relative: 6 %
HCT: 41.5 % (ref 39.0–52.0)
Hemoglobin: 14.3 g/dL (ref 13.0–17.0)
Lymphocytes Relative: 38 %
Lymphs Abs: 2.1 10*3/uL (ref 0.7–4.0)
MCH: 30.6 pg (ref 26.0–34.0)
MCHC: 34.5 g/dL (ref 30.0–36.0)
MCV: 88.9 fL (ref 78.0–100.0)
Monocytes Absolute: 0.5 10*3/uL (ref 0.1–1.0)
Monocytes Relative: 9 %
Neutro Abs: 2.5 10*3/uL (ref 1.7–7.7)
Neutrophils Relative %: 47 %
Platelets: 189 10*3/uL (ref 150–400)
RBC: 4.67 MIL/uL (ref 4.22–5.81)
RDW: 13.3 % (ref 11.5–15.5)
WBC: 5.4 10*3/uL (ref 4.0–10.5)

## 2016-06-02 LAB — RAPID URINE DRUG SCREEN, HOSP PERFORMED
Amphetamines: NOT DETECTED
Barbiturates: NOT DETECTED
Benzodiazepines: NOT DETECTED
Cocaine: NOT DETECTED
Opiates: NOT DETECTED
Tetrahydrocannabinol: NOT DETECTED

## 2016-06-02 LAB — BASIC METABOLIC PANEL
Anion gap: 8 (ref 5–15)
BUN: 13 mg/dL (ref 6–20)
CO2: 28 mmol/L (ref 22–32)
Calcium: 9.6 mg/dL (ref 8.9–10.3)
Chloride: 104 mmol/L (ref 101–111)
Creatinine, Ser: 1.15 mg/dL (ref 0.61–1.24)
GFR calc Af Amer: >60 mL/min (ref >60)
GFR calc non Af Amer: >60 mL/min (ref >60)
Glucose, Bld: 95 mg/dL (ref 65–99)
Potassium: 3.8 mmol/L (ref 3.5–5.1)
Sodium: 140 mmol/L (ref 135–145)

## 2016-06-02 LAB — ETHANOL: Alcohol, Ethyl (B): <5 mg/dL (ref <5)

## 2016-06-02 MED ORDER — ZOLPIDEM TARTRATE 5 MG PO TABS: 5.0000 mg | ORAL_TABLET | Freq: Every evening | ORAL | Status: DC | PRN

## 2016-06-02 MED ORDER — AMANTADINE HCL 100 MG PO CAPS
100.0000 mg | ORAL_CAPSULE | Freq: Two times a day (BID) | ORAL | Status: DC
  Administered 2016-06-02: 100 mg via ORAL
  Filled 2016-06-02: qty 1

## 2016-06-02 MED ORDER — ZOLPIDEM TARTRATE 5 MG PO TABS
5.0000 mg | ORAL_TABLET | Freq: Every day | ORAL | Status: DC
  Administered 2016-06-02: 5 mg via ORAL
  Filled 2016-06-02: qty 1

## 2016-06-02 MED ORDER — ACETAMINOPHEN 325 MG PO TABS: 650.0000 mg | ORAL_TABLET | ORAL | Status: DC | PRN

## 2016-06-02 MED ORDER — IBUPROFEN 200 MG PO TABS: 600.0000 mg | ORAL_TABLET | Freq: Three times a day (TID) | ORAL | Status: DC | PRN

## 2016-06-02 MED ORDER — ALUM & MAG HYDROXIDE-SIMETH 200-200-20 MG/5ML PO SUSP: 30.0000 mL | ORAL | Status: DC | PRN

## 2016-06-02 MED ORDER — PALIPERIDONE ER 6 MG PO TB24
12.0000 mg | ORAL_TABLET | Freq: Every day | ORAL | Status: DC
  Filled 2016-06-02 (×2): qty 2

## 2016-06-02 MED ORDER — ONDANSETRON HCL 4 MG PO TABS: 4.0000 mg | ORAL_TABLET | Freq: Three times a day (TID) | ORAL | Status: DC | PRN

## 2016-06-02 NOTE — ED Notes (Signed)
Bed: WBH36 Expected date:  Expected time:  Means of arrival:  Comments: TR8 

## 2016-06-02 NOTE — ED Provider Notes (Signed)
  [facility]-EMERGENCY DEPT Provider Note   CSN: [id number] Arrival date & time: 06/02/16  1706     History   Chief Complaint Chief Complaint  Patient presents with  . Psychiatric Evaluation    HPI [name] is a 25 y.o. male.  HPI Patient presents to the emergency department being dropped off by someone for "going crazy at the barbershop".  That is all the information that I can obtain.  The patient will not give me any history.  He just stares at me Past Medical History:  Diagnosis Date  . Depression     Patient Active Problem List   Diagnosis Date Noted  . 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 Cannabis use disorder, moderate, dependence (HCC) 05/10/2016    Past Surgical History:  Procedure Laterality Date  . BACK SURGERY         Home Medications    Prior to Admission medications   Medication Sig Start Date End Date Taking? Authorizing Provider  amantadine (SYMMETREL) 100 MG capsule Take 1 capsule (100 mg total) by mouth 2 (two) times daily. 05/30/16   Jimmy FootmanAndrea Hernandez-Gonzalez, MD  paliperidone (INVEGA SUSTENNA) 234 MG/1.5ML SUSP injection Inject 234 mg into the muscle every 30 (thirty) days. 06/23/16   Jimmy FootmanAndrea Hernandez-Gonzalez, MD  paliperidone (INVEGA) 6 MG 24 hr tablet Take 2 tablets (12 mg total) by mouth at bedtime. 05/30/16   Jimmy FootmanAndrea Hernandez-Gonzalez, MD    Family History No family history on file.  Social History Social History  Substance Use Topics  . Smoking status: Current Some Day Smoker  . Smokeless tobacco: Never Used  . Alcohol use No     Allergies   Shellfish allergy   Review of Systems Review of Systems Level V caveat applies due to uncooperativeness  Physical Exam Updated Vital Signs BP 156/84 (BP Location: Left Arm)   Pulse 94   Temp 98.5 F (36.9 C) (Oral)   Resp 20   SpO2 100%   Physical Exam  Constitutional: He appears well-developed and well-nourished.  HENT:  Head: Normocephalic and atraumatic.    Pulmonary/Chest: Effort normal.  Neurological: He is alert.  Psychiatric: He is noncommunicative.     ED Treatments / Results  Labs (all labs ordered are listed, but only abnormal results are displayed) Labs Reviewed - No data to display  EKG  EKG Interpretation None       Radiology No results found.  Procedures Procedures (including critical care time)  Medications Ordered in ED Medications - No data to display   Initial Impression / Assessment and Plan / ED Course  I have reviewed the triage vital signs and the nursing notes.  Pertinent labs & imaging results that were available during my care of the patient were reviewed by me and considered in my medical decision making (see chart for details).  Clinical Course     Final Clinical Impressions(s) / ED Diagnoses   Final diagnoses:  None  The patient will need TTS assessment  New Prescriptions New Prescriptions   No medications on file     Charlestine NightChristopher Raesha Coonrod, PA-C 06/02/16 1741    Samuel JesterKathleen McManus, DO 06/03/16 1547

## 2016-06-02 NOTE — ED Notes (Signed)
Patient noted in room. No complaints, stable, in no acute distress. Q15 minute rounds and monitoring via Security Cameras to continue.  

## 2016-06-02 NOTE — BH Assessment (Addendum)
Assessment Note  [name] is an 25 y.o. male that presents this date and cannot be assessed due to patient's inability to answer questions. Patient seems to be responding to some internal stimuli and stares at the wall while pointing. Patient has multiple inpatient admissions with the last one at [facility] on 05/10/16 for psychosis. Admission note this date states: "Patient presents to the emergency department being dropped off by someone for "going crazy at the barbershop". That is all the information that I can obtain. He  will not give me any history. He just stares at me." Staff nurse Florina Ou RN stated: "Pt is primarily mute and has difficulty forming words with thought blocking. He stares into the hallway as if seeing something there. He is in distress and groans when he is asked questions. He indicated to this Clinical research associate that he is having difficulty forming words. Triage note stated (Quick RN): "Pt was brought in to ED by a his aunt that left after dropping off the the pt. The aunt told the front desk that the pt "flipped out at the barber shop". Pt does not speak in triage, is motioning to be put on oxygen. Pt's aunt Myrene Buddy [phone]) called, states pt needs another psych evaluation. Pt lives with aunt, who states the pt is taking his regular psychiatric medications. Patient was brought back to [facility] and would not respond to any of this writer's questions. After staffing case with Cresenciano Genre this writer requested patient be IVCed for monitoring and medication management. Lawyer PA was contacted and completed IVC. Case was staffed with Shaune Pollack DNP who recommended inpatient admission.    Diagnosis: Psychosis  Past Medical History:  Past Medical History:  Diagnosis Date  . Depression     Past Surgical History:  Procedure Laterality Date  . BACK SURGERY      Family History: No family history on file.  Social History:  reports that he has been smoking.  He has never used smokeless tobacco. He reports  that he uses drugs, including Marijuana. He reports that he does not drink alcohol.  Additional Social History:  Alcohol / Drug Use Pain Medications: See MAR Prescriptions: See MAR Over the Counter: See MAR History of alcohol / drug use?: No history of alcohol / drug abuse Negative Consequences of Use: Personal relationships Substance #1 Name of Substance 1: marijuana daily 1 - Age of First Use: unk 1 - Amount (size/oz): unk 1 - Frequency: unk 1 - Duration: unk 1 - Last Use / Amount: unk Substance #2 Name of Substance 2: Patient admits to using [facility] but refused to provide details regarding use.  2 - Age of First Use: unk 2 - Amount (size/oz): unk 2 - Frequency: unk 2 - Duration: unk 2 - Last Use / Amount: unk  CIWA: CIWA-Ar BP: 156/84 Pulse Rate: 94 COWS:    Allergies:  Allergies  Allergen Reactions  . Shellfish Allergy Hives and Itching    Home Medications:  (Not in a hospital admission)  OB/GYN Status:  No LMP for male patient.  General Assessment Data Location of Assessment: [facility] ED TTS Assessment: In system Is this a Tele or Face-to-Face Assessment?: Face-to-Face Is this an Initial Assessment or a Re-assessment for this encounter?: Initial Assessment Marital status: Single Maiden name: na Is patient pregnant?: No Pregnancy Status: No Living Arrangements: Other relatives Can pt return to current living arrangement?: Yes Admission Status: Involuntary Is patient capable of signing voluntary admission?: No Referral Source: Self/Family/Friend Insurance type: medicaid  Medical  Screening Exam [facility] Walk-in ONLY) Medical Exam completed: Yes  Crisis Care Plan Living Arrangements: Other relatives Legal Guardian:  (unknown) Name of Psychiatrist:  (UTA) Name of Therapist:  (UTA)  Education Status Is patient currently in school?: No Current Grade: na Highest grade of school patient has completed:  (UTA) Name of school: na Contact person:  (NA)  Risk to self  with the past 6 months Suicidal Ideation:  (UTA) Has patient been a risk to self within the past 6 months prior to admission? :  (UTA) Suicidal Intent:  (UTA) Has patient had any suicidal intent within the past 6 months prior to admission? :  (UTA) Is patient at risk for suicide?:  (UTA) Suicidal Plan?:  (UTA) Has patient had any suicidal plan within the past 6 months prior to admission? :  (UTA) Access to Means:  (UTA) What has been your use of drugs/alcohol within the last 12 months?: current use per notes Previous Attempts/Gestures:  (UTA) How many times?:  (UTA) Other Self Harm Risks:  (UTA) Triggers for Past Attempts:  (UTA) Intentional Self Injurious Behavior:  (UTA) Family Suicide History: Unknown Recent stressful life event(s):  (UTA) Persecutory voices/beliefs?:  Rich Reining) Depression:  (UTA) Depression Symptoms:  (UTA) Substance abuse history and/or treatment for substance abuse?:  (UTA) Suicide prevention information given to non-admitted patients: Not applicable  Risk to Others within the past 6 months Homicidal Ideation:  (UTA) Does patient have any lifetime risk of violence toward others beyond the six months prior to admission? :  (UTA) Thoughts of Harm to Others:  (UTA) Comment - Thoughts of Harm to Others:  (UTA) Current Homicidal Intent:  (UTA) Current Homicidal Plan:  (UTA) Access to Homicidal Means:  (UTA) Identified Victim:  (UTA) History of harm to others?:  (UTA) Assessment of Violence:  (UTA) Violent Behavior Description:  (UTA) Does patient have access to weapons?:  (UTA) Criminal Charges Pending?:  (UTA) Does patient have a court date:  (UTA) Is patient on probation?:  (UTA)  Psychosis Hallucinations:  (UTA) Delusions:  (UTA)  Mental Status Report Appearance/Hygiene: In scrubs Eye Contact: Poor Motor Activity: Agitation Speech: Elective mutism Level of Consciousness: Unable to assess Mood: Threatening Affect: Anxious Anxiety Level:  Moderate Thought Processes: Unable to Assess Judgement: Unable to Assess Orientation: Unable to assess Obsessive Compulsive Thoughts/Behaviors: Unable to Assess  Cognitive Functioning Concentration: Decreased Memory: Unable to Assess IQ:  (UTA) Insight:  (UTA) Impulse Control:  (UTA) Appetite:  (UTA) Weight Loss:  (UTA) Weight Gain:  (UTA) Sleep:  (UTA) Total Hours of Sleep:  (UTA) Vegetative Symptoms:  (UTA)  ADLScreening [facility] Assessment Services) Patient's cognitive ability adequate to safely complete daily activities?: No (not on admission) Patient able to express need for assistance with ADLs?: No (not on admission) Independently performs ADLs?: Yes (appropriate for developmental age) (per notes)  Prior Inpatient Therapy Prior Inpatient Therapy: Yes Prior Therapy Dates: 2017 Prior Therapy Facilty/Provider(s): [facility] Reason for Treatment: MH issues  Prior Outpatient Therapy Prior Outpatient Therapy:  (UTA) Prior Therapy Dates:  (UTA) Prior Therapy Facilty/Provider(s):  (UTA) Reason for Treatment:  (UTA) Does patient have an ACCT team?:  (UTA) Does patient have Intensive In-House Services?  : Unknown Does patient have [facility] services? : Unknown Does patient have P4CC services?: Unknown  ADL Screening (condition at time of admission) Patient's cognitive ability adequate to safely complete daily activities?: No (not on admission) Is the patient deaf or have difficulty hearing?: No Does the patient have difficulty seeing, even when wearing glasses/contacts?: No  Does the patient have difficulty concentrating, remembering, or making decisions?: Yes (on admission) Patient able to express need for assistance with ADLs?: No (not on admission) Does the patient have difficulty dressing or bathing?: Yes Independently performs ADLs?: Yes (appropriate for developmental age) (per notes) Does the patient have difficulty walking or climbing stairs?: No Weakness of Legs:  None Weakness of Arms/Hands: None  Home Assistive Devices/Equipment Home Assistive Devices/Equipment: None  Therapy Consults (therapy consults require a physician order) PT Evaluation Needed: No OT Evalulation Needed: No SLP Evaluation Needed: No Abuse/Neglect Assessment (Assessment to be complete while patient is alone) Physical Abuse: Denies Verbal Abuse: Denies Sexual Abuse: Denies Exploitation of patient/patient's resources: Denies Self-Neglect: Denies Values / Beliefs Cultural Requests During Hospitalization: None Spiritual Requests During Hospitalization: None Consults Spiritual Care Consult Needed: No Social Work Consult Needed: No Merchant navy officerAdvance Directives (For Healthcare) Does patient have an advance directive?: No Would patient like information on creating an advanced directive?: No - patient declined information    Additional Information 1:1 In Past 12 Months?: No CIRT Risk: No Elopement Risk: No Does patient have medical clearance?: Yes     Disposition: After staffing case with Shaune PollackLord DNP this writer requested patient be IVCed for monitoring and medication management. Lawyer PA was contacted and completed IVC. Case was staffed with Shaune PollackLord DNP who recommended inpatient admission.   Disposition Initial Assessment Completed for this Encounter: Yes Disposition of Patient: Inpatient treatment program Type of inpatient treatment program: Adult  On Site Evaluation by:   Reviewed with Physician:    Alfredia Fergusonavid L Talin Feister 06/02/2016 7:04 PM

## 2016-06-02 NOTE — ED Notes (Signed)
Report to include Situation, Background, Assessment, and Recommendations received from Diane RN. Patient alert, warm and dry, in no acute distress. Patient denies SI, HI, AVH and pain although he mumbles his responses. Patient made aware of Q15 minute rounds and security cameras for their safety. Patient instructed to come to me with needs or concerns.

## 2016-06-02 NOTE — ED Notes (Signed)
Patient noted sleeping in room. No complaints, stable, in no acute distress. Q15 minute rounds and monitoring via Security Cameras to continue.  

## 2016-06-02 NOTE — ED Notes (Signed)
Pt. Spit out Thaynenvega.

## 2016-06-02 NOTE — ED Notes (Signed)
Pt is primarily mute and has difficulty forming words with thought blocking. He stares into the hallway as if seeing something there. He is in distress and groans when he is asked questions. He told one staff member that he believes [facility] problem. He indicated to this Clinical research associatewriter that he is having difficulty forming words.

## 2016-06-02 NOTE — ED Triage Notes (Addendum)
Pt was brought in to ED by a his aunt that left after dropping off the the pt. The aunt told the front desk that the pt "flipped out at the barber shop".   Pt does not speak in triage, is motioning to be put on oxygen.   Pt's aunt Myrene BuddyYvonne [phone]) called, states pt needs another psych evaluation. Pt lives with aunt, who states the pt is taking his regular psychiatric medications.

## 2016-06-03 DIAGNOSIS — F2081 Schizophreniform disorder: Secondary | ICD-10-CM

## 2016-06-03 MED ORDER — BENZTROPINE MESYLATE 1 MG PO TABS
1.0000 mg | ORAL_TABLET | Freq: Two times a day (BID) | ORAL | Status: DC
  Administered 2016-06-03 – 2016-06-04 (×3): 1 mg via ORAL
  Filled 2016-06-03 (×4): qty 1

## 2016-06-03 MED ORDER — DIPHENHYDRAMINE HCL 25 MG PO CAPS
25.0000 mg | ORAL_CAPSULE | Freq: Once | ORAL | Status: AC
  Administered 2016-06-03: 25 mg via ORAL
  Filled 2016-06-03: qty 1

## 2016-06-03 MED ORDER — PALIPERIDONE ER 6 MG PO TB24
6.0000 mg | ORAL_TABLET | Freq: Every day | ORAL | Status: DC
  Administered 2016-06-03 – 2016-06-04 (×2): 6 mg via ORAL
  Filled 2016-06-03 (×2): qty 1

## 2016-06-03 MED ORDER — TRAZODONE HCL 50 MG PO TABS
50.0000 mg | ORAL_TABLET | Freq: Every day | ORAL | Status: DC
  Administered 2016-06-03 – 2016-06-04 (×2): 50 mg via ORAL
  Filled 2016-06-03: qty 1

## 2016-06-03 MED ORDER — LORAZEPAM 1 MG PO TABS
1.0000 mg | ORAL_TABLET | Freq: Once | ORAL | Status: AC
  Administered 2016-06-03: 1 mg via ORAL
  Filled 2016-06-03: qty 1

## 2016-06-03 MED ORDER — BENZTROPINE MESYLATE 1 MG PO TABS: 1.0000 mg | ORAL_TABLET | Freq: Two times a day (BID) | ORAL | 0 refills | Status: DC

## 2016-06-03 NOTE — ED Notes (Signed)
Report to include Situation, Background, Assessment, and Recommendations received from [facility]. Patient alert and oriented, warm and dry, in no acute distress. Patient denies SI, HI, AVH and pain but does appear anxious. Patient made aware of Q15 minute rounds and security cameras for their safety. Patient instructed to come to me with needs or concerns.

## 2016-06-03 NOTE — ED Notes (Signed)
Patient noted in room. No complaints, stable, in no acute distress. Q15 minute rounds and monitoring via Security Cameras to continue.  

## 2016-06-03 NOTE — ED Notes (Signed)
Patient noted sleeping in room. No complaints, stable, in no acute distress. Q15 minute rounds and monitoring via Security Cameras to continue.  

## 2016-06-03 NOTE — ED Notes (Addendum)
Dr A updated, pt to stay until tomorrow for re-eval.  IVC has been rescinded.  Pt is aware.

## 2016-06-03 NOTE — ED Notes (Signed)
Up to the bathroom 

## 2016-06-03 NOTE — BH Assessment (Signed)
[facility]  06/03/16: Per Akintayo MD patient will be monitored overnight and re-evaluated in the a.m.

## 2016-06-03 NOTE — ED Notes (Signed)
Up in the hall. 

## 2016-06-03 NOTE — ED Notes (Signed)
Pt reports no significant improvement after the benadryl, medication given, snack given.

## 2016-06-03 NOTE — ED Notes (Signed)
Patient noted in room restless. Stable, in no acute distress. Q15 minute rounds and monitoring via [employer] Cameras to continue.

## 2016-06-03 NOTE — [facility] Suicide Risk Assessment (Signed)
Suicide Risk Assessment  Discharge Assessment   [facility]   Principal Problem: Schizophreniform disorder [facility](HCC) Discharge Diagnoses:  Patient Active Problem List   Diagnosis Date Noted  . 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 Cannabis use disorder, moderate, dependence (HCC) [F12.20] 05/10/2016    Priority: High    Total Time spent with patient: 45 minutes   Musculoskeletal: Strength & Muscle Tone: within normal limits Gait & Station: normal Patient leans: N/A  Psychiatric Specialty Exam: Physical Exam  Constitutional: He is oriented to person, place, and time. He appears well-developed and well-nourished.  HENT:  Head: Normocephalic.  Neck: Normal range of motion.  Respiratory: Effort normal.  Musculoskeletal: Normal range of motion.  Neurological: He is alert and oriented to person, place, and time.  Skin: Skin is warm and dry.  Psychiatric: His speech is normal and behavior is normal. Judgment and thought content normal. His mood appears anxious. Cognition and memory are normal.    Review of Systems  Constitutional: Negative.   HENT: Negative.   Eyes: Negative.   Respiratory: Negative.   Cardiovascular: Negative.   Gastrointestinal: Negative.   Genitourinary: Negative.   Musculoskeletal: Negative.   Skin: Negative.   Neurological: Negative.   Endo/Heme/Allergies: Negative.   Psychiatric/Behavioral: The patient is nervous/anxious.     Blood pressure 148/94, pulse 92, temperature 98.8 F (37.1 C), temperature source Oral, resp. rate 20, SpO2 95 %.There is no height or weight on file to calculate BMI.  General Appearance: Casual  Eye Contact:  Good  Speech:  Normal Rate  Volume:  Normal  Mood:  Anxious  Affect:  Congruent  Thought Process:  Coherent and Descriptions of Associations: Intact  Orientation:  Full (Time, Place, and Person)  Thought Content:  WDL  Suicidal Thoughts:  No  Homicidal  Thoughts:  No  Memory:  Immediate;   Good Recent;   Good Remote;   Good  Judgement:  Fair  Insight:  Fair  Psychomotor Activity:  Decreased  Concentration:  Concentration: Fair and Attention Span: Fair  Recall:  [employer]Fair  Fund of Knowledge:  Fair  Language:  Good  Akathisia:  No  Handed:  Right  AIMS (if indicated):     Assets:  Housing Leisure Time Physical Health Resilience Social Support  ADL's:  Intact  Cognition:  WNL  Sleep:      Mental Status Per Nursing Assessment::   On Admission:   difficulty breathing  Demographic Factors:  Male and Adolescent or young adult  Loss Factors: NA  Historical Factors: NA  Risk Reduction Factors:   Sense of responsibility to family, Living with another person, especially a relative, Positive social support and Positive therapeutic relationship  Continued Clinical Symptoms:  Anxiety, mild  Cognitive Features That Contribute To Risk:  None    Suicide Risk:  Minimal: No identifiable suicidal ideation.  Patients presenting with no risk factors but with morbid ruminations; may be classified as minimal risk based on the severity of the depressive symptoms    Plan Of Care/Follow-up recommendations:  Activity:  as tolerated Diet:  heart healthy diet  Najai Waszak, NP 06/03/2016, 10:28 AM

## 2016-06-03 NOTE — ED Notes (Signed)
NAD, resting quielty.  Pt appears anxious and reports that he is having trouble breathing..."feels like I'm holding my breath..." Pt reports it is primarily when he lays down.  BS CTA, RA sats 99% pulse 87, resp even and unlabored.  Pt repositioned HOH raised with some relief.

## 2016-06-03 NOTE — Consult Note (Signed)
[facility] Consult   Reason for Consult:  Difficulty breathing Referring Physician:  EDP Patient Identification: [name] MRN:  [id number] Principal Diagnosis: Schizophreniform disorder [facility]) Diagnosis:   Patient Active Problem List   Diagnosis Date Noted  . Schizophreniform disorder ([place]) [F20.81] 05/12/2016    Priority: High  . Cannabis use disorder, moderate, dependence ([place]) [F12.20] 05/10/2016    Priority: High    Total Time spent with patient: 45 minutes  Subjective:   [name] is a 25 y.o. male patient does not warrant admission.  HPI:  25 yo male who reports he was having difficulty breathing yesterday which scared him.  He was also experiencing some difficulty swallowing.  After refusing his Lorayne Bender last, he feels better physically today.  Most likely experiencing EPS, Benadryl given.  He reports not talking last night because of his difficulty swallowing and breathing issues.  Denies suicidal/homicidal ideations, hallucinations, and alcohol/drug abuse.  He is cooperative and wants to leave after his breathing is better.    Past Psychiatric History: schizophreniform disorder, cannabis disorder  Risk to Self: Suicidal Ideation:  (UTA) Suicidal Intent:  (UTA) Is patient at risk for suicide?:  (UTA) Suicidal Plan?:  (UTA) Access to Means:  (UTA) What has been your use of drugs/alcohol within the last 12 months?: current use per notes How many times?:  (UTA) Other Self Harm Risks:  (UTA) Triggers for Past Attempts:  (UTA) Intentional Self Injurious Behavior:  (UTA) Risk to Others: Homicidal Ideation:  (UTA) Thoughts of Harm to Others:  (UTA) Comment - Thoughts of Harm to Others:  (UTA) Current Homicidal Intent:  (UTA) Current Homicidal Plan:  (UTA) Access to Homicidal Means:  (UTA) Identified Victim:  (UTA) History of harm to others?:  (UTA) Assessment of Violence:  (UTA) Violent Behavior Description:  (UTA) Does patient have access to  weapons?:  (UTA) Criminal Charges Pending?:  (UTA) Does patient have a court date:  (UTA) Prior Inpatient Therapy: Prior Inpatient Therapy: Yes Prior Therapy Dates: 2017 Prior Therapy Facilty/Provider(s): [facility] Reason for Treatment: MH issues Prior Outpatient Therapy: Prior Outpatient Therapy:  (UTA) Prior Therapy Dates:  (UTA) Prior Therapy Facilty/Provider(s):  (UTA) Reason for Treatment:  (UTA) Does patient have an ACCT team?:  (UTA) Does patient have Intensive In-House Services?  : Unknown Does patient have [facility] services? : Unknown Does patient have P4CC services?: Unknown  Past Medical History:  Past Medical History:  Diagnosis Date  . Depression     Past Surgical History:  Procedure Laterality Date  . BACK SURGERY     Family History: No family history on file. Family Psychiatric  History: none Social History:  History  Alcohol Use No     History  Drug Use  . Types: Marijuana    Comment: rare    Social History   Social History  . Marital status: Single    Spouse name: N/A  . Number of children: N/A  . Years of education: N/A   Social History Main Topics  . Smoking status: Current Some Day Smoker  . Smokeless tobacco: Never Used  . Alcohol use No  . Drug use:     Types: Marijuana     Comment: rare  . Sexual activity: Yes    Birth control/ protection: Condom   Other Topics Concern  . None   Social History Narrative  . None   Additional Social History:    Allergies:   Allergies  Allergen Reactions  . Shellfish Allergy Hives and Itching  Labs:  Results for orders placed or performed during the hospital encounter of 06/02/16 (from the past 48 hour(s))  Basic metabolic panel     Status: None   Collection Time: 06/02/16  6:17 PM  Result Value Ref Range   Sodium 140 135 - 145 mmol/L   Potassium 3.8 3.5 - 5.1 mmol/L   Chloride 104 101 - 111 mmol/L   CO2 28 22 - 32 mmol/L   Glucose, Bld 95 65 - 99 mg/dL   BUN 13 6 - 20 mg/dL   Creatinine,  Ser 1.15 0.61 - 1.24 mg/dL   Calcium 9.6 8.9 - 10.3 mg/dL   GFR calc non Af Amer >60 >60 mL/min   GFR calc Af Amer >60 >60 mL/min    Comment: (NOTE) The eGFR has been calculated using the CKD EPI equation. This calculation has not been validated in all clinical situations. eGFR's persistently <60 mL/min signify possible Chronic Kidney Disease.    Anion gap 8 5 - 15  CBC with Differential     Status: None   Collection Time: 06/02/16  6:17 PM  Result Value Ref Range   WBC 5.4 4.0 - 10.5 K/uL   RBC 4.67 4.22 - 5.81 MIL/uL   Hemoglobin 14.3 13.0 - 17.0 g/dL   HCT 41.5 39.0 - 52.0 %   MCV 88.9 78.0 - 100.0 fL   MCH 30.6 26.0 - 34.0 pg   MCHC 34.5 30.0 - 36.0 g/dL   RDW 13.3 11.5 - 15.5 %   Platelets 189 150 - 400 K/uL   Neutrophils Relative % 47 %   Neutro Abs 2.5 1.7 - 7.7 K/uL   Lymphocytes Relative 38 %   Lymphs Abs 2.1 0.7 - 4.0 K/uL   Monocytes Relative 9 %   Monocytes Absolute 0.5 0.1 - 1.0 K/uL   Eosinophils Relative 6 %   Eosinophils Absolute 0.3 0.0 - 0.7 K/uL   Basophils Relative 0 %   Basophils Absolute 0.0 0.0 - 0.1 K/uL  Ethanol     Status: None   Collection Time: 06/02/16  6:17 PM  Result Value Ref Range   Alcohol, Ethyl (B) <5 <5 mg/dL    Comment:        LOWEST DETECTABLE LIMIT FOR SERUM ALCOHOL IS 5 mg/dL FOR MEDICAL PURPOSES ONLY   Urine rapid drug screen (hosp performed)     Status: None   Collection Time: 06/02/16  6:33 PM  Result Value Ref Range   Opiates NONE DETECTED NONE DETECTED   Cocaine NONE DETECTED NONE DETECTED   Benzodiazepines NONE DETECTED NONE DETECTED   Amphetamines NONE DETECTED NONE DETECTED   Tetrahydrocannabinol NONE DETECTED NONE DETECTED   Barbiturates NONE DETECTED NONE DETECTED    Comment:        DRUG SCREEN FOR MEDICAL PURPOSES ONLY.  IF CONFIRMATION IS NEEDED FOR ANY PURPOSE, NOTIFY LAB WITHIN 5 DAYS.        LOWEST DETECTABLE LIMITS FOR URINE DRUG SCREEN Drug Class       Cutoff (ng/mL) Amphetamine       1000 Barbiturate      200 Benzodiazepine   161 Tricyclics       096 Opiates          300 Cocaine          300 THC              50     Current Facility-Administered Medications  Medication Dose Route Frequency Provider Last Rate Last Dose  .  acetaminophen (TYLENOL) tablet 650 mg  650 mg Oral Q4H PRN Dalia Heading, PA-C      . alum & mag hydroxide-simeth (MAALOX/MYLANTA) 200-200-20 MG/5ML suspension 30 mL  30 mL Oral PRN Dalia Heading, PA-C      . benztropine (COGENTIN) tablet 1 mg  1 mg Oral BID Nathalya Wolanski, MD      . ibuprofen (ADVIL,MOTRIN) tablet 600 mg  600 mg Oral Q8H PRN Christopher Lawyer, PA-C      . ondansetron (ZOFRAN) tablet 4 mg  4 mg Oral Q8H PRN Christopher Lawyer, PA-C      . paliperidone (INVEGA) 24 hr tablet 6 mg  6 mg Oral QHS Corena Pilgrim, MD       Current Outpatient Prescriptions  Medication Sig Dispense Refill  . amantadine (SYMMETREL) 100 MG capsule Take 1 capsule (100 mg total) by mouth 2 (two) times daily. 60 capsule 0  . [START ON 06/23/2016] paliperidone (INVEGA SUSTENNA) 234 MG/1.5ML SUSP injection Inject 234 mg into the muscle every 30 (thirty) days. 0.9 mL 0  . paliperidone (INVEGA) 6 MG 24 hr tablet Take 2 tablets (12 mg total) by mouth at bedtime. 60 tablet 0    Musculoskeletal: Strength & Muscle Tone: within normal limits Gait & Station: normal Patient leans: N/A  Psychiatric Specialty Exam: Physical Exam  Constitutional: He is oriented to person, place, and time. He appears well-developed and well-nourished.  HENT:  Head: Normocephalic.  Neck: Normal range of motion.  Respiratory: Effort normal.  Musculoskeletal: Normal range of motion.  Neurological: He is alert and oriented to person, place, and time.  Skin: Skin is warm and dry.  Psychiatric: His speech is normal and behavior is normal. Judgment and thought content normal. His mood appears anxious. Cognition and memory are normal.    Review of Systems  Constitutional:  Negative.   HENT: Negative.   Eyes: Negative.   Respiratory: Negative.   Cardiovascular: Negative.   Gastrointestinal: Negative.   Genitourinary: Negative.   Musculoskeletal: Negative.   Skin: Negative.   Neurological: Negative.   Endo/Heme/Allergies: Negative.   Psychiatric/Behavioral: The patient is nervous/anxious.     Blood pressure 148/94, pulse 92, temperature 98.8 F (37.1 C), temperature source Oral, resp. rate 20, SpO2 95 %.There is no height or weight on file to calculate BMI.  General Appearance: Casual  Eye Contact:  Good  Speech:  Normal Rate  Volume:  Normal  Mood:  Anxious  Affect:  Congruent  Thought Process:  Coherent and Descriptions of Associations: Intact  Orientation:  Full (Time, Place, and Person)  Thought Content:  WDL  Suicidal Thoughts:  No  Homicidal Thoughts:  No  Memory:  Immediate;   Good Recent;   Good Remote;   Good  Judgement:  Fair  Insight:  Fair  Psychomotor Activity:  Decreased  Concentration:  Concentration: Fair and Attention Span: Fair  Recall:  [employer] of Knowledge:  Fair  Language:  Good  Akathisia:  No  Handed:  Right  AIMS (if indicated):     Assets:  Housing Leisure Time Physical Health Resilience Social Support  ADL's:  Intact  Cognition:  WNL  Sleep:        Treatment Plan Summary: Daily contact with patient to assess and evaluate symptoms and progress in treatment, Medication management and Plan schizophreniform disorder:  -Crisis stabilization -Medication management: Benadryl 25 mg once for EPS.  Stopped Symmetrel 100 mg BID and started Cogentin 1 mg BID for EPS.  Continued Invega 6 mg daily  for psychosis. -Individual counseling  Disposition: No evidence of imminent risk to self or others at present.    Waylan Boga, NP 06/03/2016 10:19 AM  Patient seen face-to-face for psychiatric evaluation, chart reviewed and case discussed with the physician extender and developed treatment plan. Reviewed the  information documented and agree with the treatment plan. Corena Pilgrim, MD

## 2016-06-03 NOTE — ED Notes (Addendum)
Up tot he bathroom to shower and change scrubs 

## 2016-06-03 NOTE — ED Notes (Signed)
Pt reports he is still not feeling well., speech some better. Was able to eat crackers/soda.

## 2016-06-03 NOTE — ED Notes (Signed)
Dr Mervyn SkeetersA and Catha NottinghamJamison DNP into see.  Pt reports he lives with his grandmother and aunt.  Pt reports that he "flipped out" at the barber shop when he had difficulty breathing.  Pt also reports that he has been having difficulty swallowing.  Pt continues to denies si/hi/avh.  Pt's speech slow and he has difficulty opening his mouth fully.

## 2016-06-04 MED ORDER — PALIPERIDONE ER 6 MG PO TB24: 6.0000 mg | ORAL_TABLET | Freq: Every day | ORAL | 0 refills | Status: DC

## 2016-06-04 NOTE — ED Notes (Signed)
Patient noted sleeping in room. No complaints, stable, in no acute distress. Q15 minute rounds and monitoring via Security Cameras to continue.  

## 2016-06-04 NOTE — ED Notes (Signed)
TTS into see 

## 2016-06-04 NOTE — Discharge Instructions (Signed)
Stop Symmetrel

## 2016-06-04 NOTE — Consult Note (Signed)
[facility] Consult   Reason for Consult:  Difficulty breathing Referring Physician:  EDP Patient Identification: [name] MRN:  [id number] Principal Diagnosis: Schizophreniform disorder [facility]) Diagnosis:   Patient Active Problem List   Diagnosis Date Noted  . Schizophreniform disorder ([place]) [F20.81] 05/12/2016    Priority: High  . Cannabis use disorder, moderate, dependence ([place]) [F12.20] 05/10/2016    Priority: High    Total Time spent with patient: 30 minutes  Subjective:   [name] is a 25 y.o. male patient does not warrant admission.  HPI:  On admission:  25 yo male who reports he was having difficulty breathing yesterday which scared him.  He was also experiencing some difficulty swallowing.  After refusing his Lorayne Bender last, he feels better physically today.  Most likely experiencing EPS, Benadryl given.  He reports not talking last night because of his difficulty swallowing and breathing issues.  Denies suicidal/homicidal ideations, hallucinations, and alcohol/drug abuse.  He is cooperative and wants to leave after his breathing is better.    Patient was kept over night due to his reporting paranoia, people following him, and EPS resolution.  Today, he smiles on assessment and denies paranoia or hallucinations.  No EPS or difficulty swallowing.  Stable for discharge.    Past Psychiatric History: schizophreniform disorder, cannabis disorder  Risk to Self: Suicidal Ideation:  (UTA) Suicidal Intent:  (UTA) Is patient at risk for suicide?:  (UTA) Suicidal Plan?:  (UTA) Access to Means:  (UTA) What has been your use of drugs/alcohol within the last 12 months?: current use per notes How many times?:  (UTA) Other Self Harm Risks:  (UTA) Triggers for Past Attempts:  (UTA) Intentional Self Injurious Behavior:  (UTA) Risk to Others: Homicidal Ideation:  (UTA) Thoughts of Harm to Others:  (UTA) Comment - Thoughts of Harm to Others:  (UTA) Current Homicidal  Intent:  (UTA) Current Homicidal Plan:  (UTA) Access to Homicidal Means:  (UTA) Identified Victim:  (UTA) History of harm to others?:  (UTA) Assessment of Violence:  (UTA) Violent Behavior Description:  (UTA) Does patient have access to weapons?:  (UTA) Criminal Charges Pending?:  (UTA) Does patient have a court date:  (UTA) Prior Inpatient Therapy: Prior Inpatient Therapy: Yes Prior Therapy Dates: 2017 Prior Therapy Facilty/Provider(s): [facility] Reason for Treatment: MH issues Prior Outpatient Therapy: Prior Outpatient Therapy:  (UTA) Prior Therapy Dates:  (UTA) Prior Therapy Facilty/Provider(s):  (UTA) Reason for Treatment:  (UTA) Does patient have an ACCT team?:  (UTA) Does patient have Intensive In-House Services?  : Unknown Does patient have [facility] services? : Unknown Does patient have P4CC services?: Unknown  Past Medical History:  Past Medical History:  Diagnosis Date  . Depression     Past Surgical History:  Procedure Laterality Date  . BACK SURGERY     Family History: No family history on file. Family Psychiatric  History: none Social History:  History  Alcohol Use No     History  Drug Use  . Types: Marijuana    Comment: rare    Social History   Social History  . Marital status: Single    Spouse name: N/A  . Number of children: N/A  . Years of education: N/A   Social History Main Topics  . Smoking status: Current Some Day Smoker  . Smokeless tobacco: Never Used  . Alcohol use No  . Drug use:     Types: Marijuana     Comment: rare  . Sexual activity: Yes    Birth  control/ protection: Condom   Other Topics Concern  . None   Social History Narrative  . None   Additional Social History:    Allergies:   Allergies  Allergen Reactions  . Shellfish Allergy Hives and Itching    Labs:  Results for orders placed or performed during the hospital encounter of 06/02/16 (from the past 48 hour(s))  Basic metabolic panel     Status: None   Collection  Time: 06/02/16  6:17 PM  Result Value Ref Range   Sodium 140 135 - 145 mmol/L   Potassium 3.8 3.5 - 5.1 mmol/L   Chloride 104 101 - 111 mmol/L   CO2 28 22 - 32 mmol/L   Glucose, Bld 95 65 - 99 mg/dL   BUN 13 6 - 20 mg/dL   Creatinine, Ser 1.15 0.61 - 1.24 mg/dL   Calcium 9.6 8.9 - 10.3 mg/dL   GFR calc non Af Amer >60 >60 mL/min   GFR calc Af Amer >60 >60 mL/min    Comment: (NOTE) The eGFR has been calculated using the CKD EPI equation. This calculation has not been validated in all clinical situations. eGFR's persistently <60 mL/min signify possible Chronic Kidney Disease.    Anion gap 8 5 - 15  CBC with Differential     Status: None   Collection Time: 06/02/16  6:17 PM  Result Value Ref Range   WBC 5.4 4.0 - 10.5 K/uL   RBC 4.67 4.22 - 5.81 MIL/uL   Hemoglobin 14.3 13.0 - 17.0 g/dL   HCT 41.5 39.0 - 52.0 %   MCV 88.9 78.0 - 100.0 fL   MCH 30.6 26.0 - 34.0 pg   MCHC 34.5 30.0 - 36.0 g/dL   RDW 13.3 11.5 - 15.5 %   Platelets 189 150 - 400 K/uL   Neutrophils Relative % 47 %   Neutro Abs 2.5 1.7 - 7.7 K/uL   Lymphocytes Relative 38 %   Lymphs Abs 2.1 0.7 - 4.0 K/uL   Monocytes Relative 9 %   Monocytes Absolute 0.5 0.1 - 1.0 K/uL   Eosinophils Relative 6 %   Eosinophils Absolute 0.3 0.0 - 0.7 K/uL   Basophils Relative 0 %   Basophils Absolute 0.0 0.0 - 0.1 K/uL  Ethanol     Status: None   Collection Time: 06/02/16  6:17 PM  Result Value Ref Range   Alcohol, Ethyl (B) <5 <5 mg/dL    Comment:        LOWEST DETECTABLE LIMIT FOR SERUM ALCOHOL IS 5 mg/dL FOR MEDICAL PURPOSES ONLY   Urine rapid drug screen (hosp performed)     Status: None   Collection Time: 06/02/16  6:33 PM  Result Value Ref Range   Opiates NONE DETECTED NONE DETECTED   Cocaine NONE DETECTED NONE DETECTED   Benzodiazepines NONE DETECTED NONE DETECTED   Amphetamines NONE DETECTED NONE DETECTED   Tetrahydrocannabinol NONE DETECTED NONE DETECTED   Barbiturates NONE DETECTED NONE DETECTED    Comment:         DRUG SCREEN FOR MEDICAL PURPOSES ONLY.  IF CONFIRMATION IS NEEDED FOR ANY PURPOSE, NOTIFY LAB WITHIN 5 DAYS.        LOWEST DETECTABLE LIMITS FOR URINE DRUG SCREEN Drug Class       Cutoff (ng/mL) Amphetamine      1000 Barbiturate      200 Benzodiazepine   387 Tricyclics       564 Opiates          300  Cocaine          300 THC              50     Current Facility-Administered Medications  Medication Dose Route Frequency Provider Last Rate Last Dose  . acetaminophen (TYLENOL) tablet 650 mg  650 mg Oral Q4H PRN Dalia Heading, PA-C      . alum & mag hydroxide-simeth (MAALOX/MYLANTA) 200-200-20 MG/5ML suspension 30 mL  30 mL Oral PRN Dalia Heading, PA-C      . benztropine (COGENTIN) tablet 1 mg  1 mg Oral BID Corena Pilgrim, MD   1 mg at 06/04/16 0929  . ibuprofen (ADVIL,MOTRIN) tablet 600 mg  600 mg Oral Q8H PRN Dalia Heading, PA-C      . ondansetron [facility]) tablet 4 mg  4 mg Oral Q8H PRN Dalia Heading, PA-C      . paliperidone (INVEGA) 24 hr tablet 6 mg  6 mg Oral QHS Corena Pilgrim, MD   6 mg at 06/03/16 2104  . traZODone (DESYREL) tablet 50 mg  50 mg Oral QHS Dara Hoyer, PA-C   50 mg at 06/03/16 2104   Current Outpatient Prescriptions  Medication Sig Dispense Refill  . amantadine (SYMMETREL) 100 MG capsule Take 1 capsule (100 mg total) by mouth 2 (two) times daily. 60 capsule 0  . benztropine (COGENTIN) 1 MG tablet Take 1 tablet (1 mg total) by mouth 2 (two) times daily. 60 tablet 0  . [START ON 06/23/2016] paliperidone (INVEGA SUSTENNA) 234 MG/1.5ML SUSP injection Inject 234 mg into the muscle every 30 (thirty) days. 0.9 mL 0  . paliperidone (INVEGA) 6 MG 24 hr tablet Take 2 tablets (12 mg total) by mouth at bedtime. 60 tablet 0    Musculoskeletal: Strength & Muscle Tone: within normal limits Gait & Station: normal Patient leans: N/A  Psychiatric Specialty Exam: Physical Exam  Constitutional: He is oriented to person, place, and time. He  appears well-developed and well-nourished.  HENT:  Head: Normocephalic.  Neck: Normal range of motion.  Respiratory: Effort normal.  Musculoskeletal: Normal range of motion.  Neurological: He is alert and oriented to person, place, and time.  Skin: Skin is warm and dry.  Psychiatric: He has a normal mood and affect. His speech is normal and behavior is normal. Judgment and thought content normal. Cognition and memory are normal.    Review of Systems  Constitutional: Negative.   HENT: Negative.   Eyes: Negative.   Respiratory: Negative.   Cardiovascular: Negative.   Gastrointestinal: Negative.   Genitourinary: Negative.   Musculoskeletal: Negative.   Skin: Negative.   Neurological: Negative.   Endo/Heme/Allergies: Negative.   Psychiatric/Behavioral: Negative.     Blood pressure 104/66, pulse 70, temperature 98.1 F (36.7 C), temperature source Oral, resp. rate 19, SpO2 99 %.There is no height or weight on file to calculate BMI.  General Appearance: Casual  Eye Contact:  Good  Speech:  Normal Rate  Volume:  Normal  Mood:  Euthymic  Affect:  Congruent  Thought Process:  Coherent and Descriptions of Associations: Intact  Orientation:  Full (Time, Place, and Person)  Thought Content:  WDL  Suicidal Thoughts:  No  Homicidal Thoughts:  No  Memory:  Immediate;   Good Recent;   Good Remote;   Good  Judgement:  Fair  Insight:  Fair  Psychomotor Activity:  Decreased  Concentration:  Concentration: Fair and Attention Span: Fair  Recall:  [employer] of Knowledge:  Fair  LanguageKermit Balo  Akathisia:  No  Handed:  Right  AIMS (if indicated):     Assets:  Housing Leisure Time Physical Health Resilience Social Support  ADL's:  Intact  Cognition:  WNL  Sleep:        Treatment Plan Summary: Daily contact with patient to assess and evaluate symptoms and progress in treatment, Medication management and Plan schizophreniform disorder:  -Crisis stabilization -Medication  management:  Continued Cogentin 1 mg BID for EPS and Invega 6 mg daily for psychosis. -Individual counseling  Disposition: No evidence of imminent risk to self or others at present.    Waylan Boga, NP 06/04/2016 10:46 AM  Patient seen face-to-face for psychiatric evaluation, chart reviewed and case discussed with the physician extender and developed treatment plan. Reviewed the information documented and agree with the treatment plan. Corena Pilgrim, MD

## 2016-06-04 NOTE — ED Notes (Signed)
Up in room, nad, waiting for grandmother to arrive. Verbal permission from pt to review dc instruction w/ grandmother.  Will recontact grandmother reguarding picking pt up.

## 2016-06-04 NOTE — ED Notes (Addendum)
Pt's aunt (lavern) called and she is going to arrainge for someone to pick him up.  Pt's grandmother is out of town but will be home later today.  Jamison DNP updated, OK to dc,  give bedtime meds (invega and cogentin) on dc. VORB

## 2016-06-04 NOTE — ED Notes (Signed)
Pt resting quietly, ate all of breakfast.  NAD, but minimal verbal responses and slow to respond.  Medicated w/o difficulty,

## 2016-06-04 NOTE — ED Notes (Signed)
Up to the bathroom 

## 2016-06-04 NOTE — [facility] Suicide Risk Assessment (Signed)
Suicide Risk Assessment  Discharge Assessment   [facility]   Principal Problem: Schizophreniform disorder [facility](HCC) Discharge Diagnoses:  Patient Active Problem List   Diagnosis Date Noted  . 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 Cannabis use disorder, moderate, dependence (HCC) [F12.20] 05/10/2016    Priority: High    Total Time spent with patient: 30 minutes    Musculoskeletal: Strength & Muscle Tone: within normal limits Gait & Station: normal Patient leans: N/A  Psychiatric Specialty Exam: Physical Exam  Constitutional: He is oriented to person, place, and time. He appears well-developed and well-nourished.  HENT:  Head: Normocephalic.  Neck: Normal range of motion.  Respiratory: Effort normal.  Musculoskeletal: Normal range of motion.  Neurological: He is alert and oriented to person, place, and time.  Skin: Skin is warm and dry.  Psychiatric: He has a normal mood and affect. His speech is normal and behavior is normal. Judgment and thought content normal. Cognition and memory are normal.    Review of Systems  Constitutional: Negative.   HENT: Negative.   Eyes: Negative.   Respiratory: Negative.   Cardiovascular: Negative.   Gastrointestinal: Negative.   Genitourinary: Negative.   Musculoskeletal: Negative.   Skin: Negative.   Neurological: Negative.   Endo/Heme/Allergies: Negative.   Psychiatric/Behavioral: Negative.     Blood pressure 104/66, pulse 70, temperature 98.1 F (36.7 C), temperature source Oral, resp. rate 19, SpO2 99 %.There is no height or weight on file to calculate BMI.  General Appearance: Casual  Eye Contact:  Good  Speech:  Normal Rate  Volume:  Normal  Mood:  Euthymic  Affect:  Congruent  Thought Process:  Coherent and Descriptions of Associations: Intact  Orientation:  Full (Time, Place, and Person)  Thought Content:  WDL  Suicidal Thoughts:  No  Homicidal Thoughts:  No   Memory:  Immediate;   Good Recent;   Good Remote;   Good  Judgement:  Fair  Insight:  Fair  Psychomotor Activity:  Decreased  Concentration:  Concentration: Fair and Attention Span: Fair  Recall:  FiservFair  Fund of Knowledge:  Fair  Language:  Good  Akathisia:  No  Handed:  Right  AIMS (if indicated):     Assets:  Housing Leisure Time Physical Health Resilience Social Support  ADL's:  Intact  Cognition:  WNL  Sleep:       Mental Status Per Nursing Assessment::   On Admission:   EPS and paranoia  Demographic Factors:  Male and Adolescent or young adult  Loss Factors: NA  Historical Factors: NA  Risk Reduction Factors:   Sense of responsibility to family, Positive social support and Positive therapeutic relationship  Continued Clinical Symptoms:  NONE  Cognitive Features That Contribute To Risk:  None    Suicide Risk:  Minimal: No identifiable suicidal ideation.  Patients presenting with no risk factors but with morbid ruminations; may be classified as minimal risk based on the severity of the depressive symptoms    Plan Of Care/Follow-up recommendations:  Activity:  as tolerated Diet:  heart healthy diet  Lexx Monte, NP 06/04/2016, 10:50 AM

## 2016-06-04 NOTE — ED Notes (Signed)
Up to th bathroom, pt's grandmother to pick him up later today

## 2016-06-04 NOTE — ED Notes (Signed)
Up tot he bathroom to shower and change scrubs 

## 2016-06-04 NOTE — ED Notes (Addendum)
Snack given.  Pt reports he does not have anyplace to go, and that he stayed with his grandmother, not his aunt.

## 2016-06-04 NOTE — ED Notes (Signed)
Message left for grandmother to call

## 2016-06-04 NOTE — ED Notes (Signed)
No answer at home number, message left for g'dmother to return call on cell

## 2016-07-22 ENCOUNTER — Encounter (HOSPITAL_COMMUNITY): Payer: Self-pay | Admitting: Emergency Medicine

## 2016-07-22 DIAGNOSIS — J45901 Unspecified asthma with (acute) exacerbation: Secondary | ICD-10-CM | POA: Insufficient documentation

## 2016-07-22 DIAGNOSIS — F17200 Nicotine dependence, unspecified, uncomplicated: Secondary | ICD-10-CM | POA: Insufficient documentation

## 2016-07-22 MED ORDER — ALBUTEROL SULFATE (2.5 MG/3ML) 0.083% IN NEBU
INHALATION_SOLUTION | RESPIRATORY_TRACT | Status: AC
  Filled 2016-07-22: qty 6

## 2016-07-22 MED ORDER — ALBUTEROL SULFATE (2.5 MG/3ML) 0.083% IN NEBU
5.0000 mg | INHALATION_SOLUTION | Freq: Once | RESPIRATORY_TRACT | Status: AC
  Administered 2016-07-22: 5 mg via RESPIRATORY_TRACT

## 2016-07-22 NOTE — ED Triage Notes (Signed)
Pt. reports productive cough , wheezing , chest congestion and nasal congestion onset this evening , denies fever or chills.

## 2016-07-23 ENCOUNTER — Emergency Department (HOSPITAL_COMMUNITY): Payer: Medicaid Other

## 2016-07-23 ENCOUNTER — Emergency Department (HOSPITAL_COMMUNITY)
Admission: EM | Admit: 2016-07-23 | Discharge: 2016-07-23 | Disposition: A | Discharge status: Home / Self Care | Payer: Medicaid Other | Attending: Emergency Medicine | Admitting: Emergency Medicine

## 2016-07-23 DIAGNOSIS — J45901 Unspecified asthma with (acute) exacerbation: Secondary | ICD-10-CM

## 2016-07-23 MED ORDER — ALBUTEROL SULFATE HFA 108 (90 BASE) MCG/ACT IN AERS
2.0000 | INHALATION_SPRAY | RESPIRATORY_TRACT | Status: DC | PRN
Start: 2016-07-23 — End: 2016-07-23
  Administered 2016-07-23: 2 via RESPIRATORY_TRACT
  Filled 2016-07-23: qty 6.7

## 2016-07-23 MED ORDER — PREDNISONE 20 MG PO TABS
40.0000 mg | ORAL_TABLET | Freq: Once | ORAL | Status: AC
  Administered 2016-07-23: 40 mg via ORAL
  Filled 2016-07-23: qty 2

## 2016-07-23 MED ORDER — PREDNISONE 10 MG PO TABS: 20.0000 mg | ORAL_TABLET | Freq: Two times a day (BID) | ORAL | 0 refills | Status: DC

## 2016-07-23 NOTE — ED Notes (Addendum)
Patient to registration stating he is SOB and if he isn't seen soon will leave.  Patient back to triage to be rechecked

## 2016-07-23 NOTE — ED Notes (Signed)
EDP explained plan of care to pt.  

## 2016-07-23 NOTE — ED Provider Notes (Signed)
[facility]-EMERGENCY DEPT Provider Note   CSN: [id number] Arrival date & time: 07/22/16  2306  By signing my name below, I, [name], attest that this documentation has been prepared under the direction and in the presence of Adrian Lyons, MD. Electronically Signed: Soijett Warner, ED Scribe. 07/23/16. 3:43 AM.   History   Chief Complaint Chief Complaint  Patient presents with  . Asthma    HPI  [name] is a 25 y.o. male who presents to the Emergency Department complaining of exacerbation of asthma onset PTA. Pt denies having an albuterol inhaler for his symptoms. He states that he is having associated symptoms of productive cough, wheezing, nasal congestion. He states that he was given a breathing treatment while in the ED with no relief of his symptoms. He denies any other symptoms.   The history is provided by the patient. No language interpreter was used.    Past Medical History:  Diagnosis Date  . Depression     Patient Active Problem List   Diagnosis Date Noted  . 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 Cannabis use disorder, moderate, dependence (HCC) 05/10/2016    Past Surgical History:  Procedure Laterality Date  . BACK SURGERY         Home Medications    Prior to Admission medications   Medication Sig Start Date End Date Taking? Authorizing Provider  amantadine (SYMMETREL) 100 MG capsule Take 1 capsule (100 mg total) by mouth 2 (two) times daily. 05/30/16   Jimmy Footman, MD  benztropine (COGENTIN) 1 MG tablet Take 1 tablet (1 mg total) by mouth 2 (two) times daily. 06/03/16   Charm Rings, NP  paliperidone (INVEGA SUSTENNA) 234 MG/1.5ML SUSP injection Inject 234 mg into the muscle every 30 (thirty) days. 06/23/16   Jimmy Footman, MD  paliperidone (INVEGA) 6 MG 24 hr tablet Take 2 tablets (12 mg total) by mouth at bedtime. 05/30/16   Jimmy Footman, MD  paliperidone (INVEGA) 6 MG 24 hr tablet Take 1 tablet (6 mg  total) by mouth at bedtime. 06/04/16   Charm Rings, NP  predniSONE (DELTASONE) 10 MG tablet Take 2 tablets (20 mg total) by mouth 2 (two) times daily. 07/23/16   Adrian Lyons, MD    Family History No family history on file.  Social History Social History  Substance Use Topics  . Smoking status: Current Some Day Smoker  . Smokeless tobacco: Never Used  . Alcohol use No     Allergies   Shellfish allergy   Review of Systems Review of Systems  All other systems reviewed and are negative.    Physical Exam Updated Vital Signs BP (!) 130/103 (BP Location: Right Arm)   Pulse 73   Temp 99.2 F (37.3 C) (Oral)   Resp 18   Ht 5\' 8"  (1.727 m)   Wt 177 lb (80.3 kg)   SpO2 95%   BMI 26.91 kg/m   Physical Exam  Constitutional: He is oriented to person, place, and time. He appears well-developed and well-nourished. No distress.  HENT:  Head: Normocephalic and atraumatic.  Right Ear: Hearing normal.  Left Ear: Hearing normal.  Nose: Nose normal.  Mouth/Throat: Oropharynx is clear and moist and mucous membranes are normal.  Eyes: Conjunctivae and EOM are normal. Pupils are equal, round, and reactive to light.  Neck: Normal range of motion. Neck supple.  Cardiovascular: Normal rate, regular rhythm, S1 normal, S2 normal and normal heart sounds.  Exam reveals no gallop and no friction rub.  No murmur heard. Pulmonary/Chest: Effort normal. No respiratory distress. He has wheezes. He has no rales. He exhibits no tenderness.  Slight expiratory wheezing bilaterally. No respiratory distress.  Abdominal: Soft. Normal appearance and bowel sounds are normal. There is no hepatosplenomegaly. There is no tenderness. There is no rebound, no guarding, no tenderness at McBurney's point and negative Murphy's sign. No hernia.  Musculoskeletal: Normal range of motion.  Neurological: He is alert and oriented to person, place, and time. He has normal strength. No cranial nerve deficit or sensory  deficit. Coordination normal. GCS eye subscore is 4. GCS verbal subscore is 5. GCS motor subscore is 6.  Skin: Skin is warm, dry and intact. No rash noted. No cyanosis.  Psychiatric: He has a normal mood and affect. His speech is normal and behavior is normal. Thought content normal.  Nursing note and vitals reviewed.    ED Treatments / Results  DIAGNOSTIC STUDIES: Oxygen Saturation is 95% on RA, adequate by my interpretation.    COORDINATION OF CARE: 3:39 AM Discussed treatment plan with pt at bedside which includes breathing treatment, CXR, steroid, albuterol inhaler, and pt agreed to plan.   Radiology Dg Chest 2 View  Result Date: 07/23/2016 CLINICAL DATA:  25 y/o M; productive cough, wheezing, and chest congestion. EXAM: CHEST  2 VIEW COMPARISON:  05/23/2015 chest radiograph FINDINGS: The heart size and mediastinal contours are within normal limits and stable. Both lungs are clear. The visualized skeletal structures are unremarkable. IMPRESSION: No active cardiopulmonary disease. Electronically Signed   By: Mitzi HansenLance  Furusawa-Stratton M.D.   On: 07/23/2016 02:54    Procedures Procedures (including critical care time)  Medications Ordered in ED Medications  albuterol (PROVENTIL) (2.5 MG/3ML) 0.083% nebulizer solution (not administered)  albuterol (PROVENTIL) (2.5 MG/3ML) 0.083% nebulizer solution 5 mg (5 mg Nebulization Given 07/22/16 2319)     Initial Impression / Assessment and Plan / ED Course  I have reviewed the triage vital signs and the nursing notes.  Pertinent imaging results that were available during my care of the patient were reviewed by me and considered in my medical decision making (see chart for details).  Clinical Course    Patient will be treated for an asthma exacerbation. There is no hypoxia and no respiratory distress. He is to return as needed if he worsens.  Final Clinical Impressions(s) / ED Diagnoses   Final diagnoses:  Moderate asthma with  exacerbation, unspecified whether persistent    New Prescriptions New Prescriptions   PREDNISONE (DELTASONE) 10 MG TABLET    Take 2 tablets (20 mg total) by mouth 2 (two) times daily.    I personally performed the services described in this documentation, which was scribed in my presence. The recorded information has been reviewed and is accurate.        Adrian Lyonsouglas Koua Deeg, MD 07/23/16 [phone]

## 2016-07-23 NOTE — Discharge Instructions (Signed)
Prednisone as prescribed.  Albuterol inhaler: 2 puffs every 4 hours as needed for wheezing.  Return to the emergency department if your breathing significantly worsens, you develop chest pain, or other new and concerning symptoms.

## 2017-05-25 ENCOUNTER — Emergency Department (HOSPITAL_COMMUNITY)
Admission: EM | Admit: 2017-05-25 | Discharge: 2017-05-25 | Disposition: A | Discharge status: Home / Self Care | Payer: Medicaid Other | Attending: Emergency Medicine | Admitting: Emergency Medicine

## 2017-05-25 ENCOUNTER — Encounter (HOSPITAL_COMMUNITY): Payer: Self-pay

## 2017-05-25 DIAGNOSIS — Z79899 Other long term (current) drug therapy: Secondary | ICD-10-CM | POA: Insufficient documentation

## 2017-05-25 DIAGNOSIS — F17210 Nicotine dependence, cigarettes, uncomplicated: Secondary | ICD-10-CM | POA: Diagnosis not present

## 2017-05-25 DIAGNOSIS — F29 Unspecified psychosis not due to a substance or known physiological condition: Secondary | ICD-10-CM | POA: Insufficient documentation

## 2017-05-25 LAB — COMPREHENSIVE METABOLIC PANEL
ALT: 12 U/L — ABNORMAL LOW (ref 17–63)
AST: 19 U/L (ref 15–41)
Albumin: 4.7 g/dL (ref 3.5–5.0)
Alkaline Phosphatase: 37 U/L — ABNORMAL LOW (ref 38–126)
Anion gap: 9 (ref 5–15)
BUN: 14 mg/dL (ref 6–20)
CO2: 27 mmol/L (ref 22–32)
Calcium: 9.8 mg/dL (ref 8.9–10.3)
Chloride: 104 mmol/L (ref 101–111)
Creatinine, Ser: 1.23 mg/dL (ref 0.61–1.24)
GFR calc Af Amer: >60 mL/min (ref >60)
GFR calc non Af Amer: >60 mL/min (ref >60)
Glucose, Bld: 114 mg/dL — ABNORMAL HIGH (ref 65–99)
Potassium: 3.7 mmol/L (ref 3.5–5.1)
Sodium: 140 mmol/L (ref 135–145)
Total Bilirubin: 1.3 mg/dL — ABNORMAL HIGH (ref 0.3–1.2)
Total Protein: 7.2 g/dL (ref 6.5–8.1)

## 2017-05-25 LAB — RAPID URINE DRUG SCREEN, HOSP PERFORMED
Amphetamines: NOT DETECTED
Barbiturates: NOT DETECTED
Benzodiazepines: NOT DETECTED
Cocaine: NOT DETECTED
Opiates: NOT DETECTED
Tetrahydrocannabinol: POSITIVE — AB

## 2017-05-25 LAB — CBC
HCT: 43.4 % (ref 39.0–52.0)
Hemoglobin: 15.6 g/dL (ref 13.0–17.0)
MCH: 31.3 pg (ref 26.0–34.0)
MCHC: 35.9 g/dL (ref 30.0–36.0)
MCV: 87 fL (ref 78.0–100.0)
Platelets: 185 10*3/uL (ref 150–400)
RBC: 4.99 MIL/uL (ref 4.22–5.81)
RDW: 12.7 % (ref 11.5–15.5)
WBC: 4 10*3/uL (ref 4.0–10.5)

## 2017-05-25 LAB — SALICYLATE LEVEL: Salicylate Lvl: <7 mg/dL (ref 2.8–30.0)

## 2017-05-25 LAB — ACETAMINOPHEN LEVEL: Acetaminophen (Tylenol), Serum: <10 ug/mL — ABNORMAL LOW (ref 10–30)

## 2017-05-25 LAB — ETHANOL: Alcohol, Ethyl (B): <5 mg/dL (ref <5)

## 2017-05-25 NOTE — ED Notes (Signed)
Patient is up for discharge. This nurse called patient [name] at [phone]. Patient's aunt states she will be at [facility] shortly to pick up patient.

## 2017-05-25 NOTE — ED Notes (Signed)
Discharge instructions reviewed with patient and patient's Aunt, at patient's request. Patient verbalizes understanding. VSS. Patient aunt to drive patient home.

## 2017-05-25 NOTE — BH Assessment (Signed)
[facility] Assessment Progress Note Patient is asking to be discharged as this Clinical research associate consults with Mesner MD to assist with evaluation. Mesner MD noted, "Sounds like worsening psychotic symptoms. States compliance. Will discuss with his aunt who apparently helps him. otherwise will consult TTS. TTS consult, no need for IVC. Patient wanting to leave. Reevaluated and not SI/HI. Psychosis is minor. Will seek help with his psychiatrist and contracts for safety and return if worsening symptoms. No indication for IVC, will dc.

## 2017-05-25 NOTE — BH Assessment (Addendum)
Assessment Note  [name] is an 26 y.o. male that presents this date voluntary stating his "aunt brought him in." Patient is very vague in reference to why he presents stating he currently resides with his Aunt and is "not sure" why he is here. Patient denies any S/I, H/I or AVH. Patient presents with a pleasant affect and is time/place oriented. Patient does admit to having a history of Schizophrenia and reports he receives services from a provider "[place]town." Patient reports he is currently compliant with his medication management and his aunt [name] [phone]) helps him "keep track of when he takes them." Patient cannot recall what medication he is currently receiving but reports he "took them this morning." Patient attempts to contact his aunt by phone while this writer is present in the room but aunt could not be reached. Patient is asking to be discharged and denies any thoughts of self harm or H/I. Patient does admit to ongoing VH but states "that is normal." Patient will not elaborate on [facility] but denies current hallucinations. Per note review, patient was last admitted on 05/09/16 for medication management and increased aggression. Patient was a poor historian as information was gathered from notes to complete assessment. Per notes, patient has a history of schizophrenia and bipolar disorder. Patient unsure of medical history or what medication he takes daily. Patient reports he was dropped off at the ER by his aunt, who gives him his medications. This Clinical research associate staffed case with Shaune Pollack DNP who recommended patient be re-evaluated in the a.m. Patient is asking to be discharged as this Clinical research associate consults with Mesner MD to assist with evaluation. Mesner MD noted, "Sounds like worsening psychotic symptoms. States compliance. Will discuss with his aunt who apparently helps him. otherwise will consult TTS. TTS consult, no need for IVC. Patient wanting to leave. Reevaluated and not SI/HI. Psychosis is minor.  Will seek help with his psychiatrist and contracts for safety and return if worsening symptoms. No indication for IVC, will dc.   Diagnosis: Schizophrenia  Past Medical History:  Past Medical History:  Diagnosis Date  . Depression     Past Surgical History:  Procedure Laterality Date  . BACK SURGERY      Family History: History reviewed. No pertinent family history.  Social History:  reports that he has been smoking.  He has never used smokeless tobacco. He reports that he uses drugs, including Marijuana. He reports that he does not drink alcohol.  Additional Social History:  Alcohol / Drug Use Pain Medications: See MAR Prescriptions: See MAR Over the Counter: See MAR History of alcohol / drug use?: No history of alcohol / drug abuse Longest period of sobriety (when/how long): NA Negative Consequences of Use:  (Denies) Withdrawal Symptoms:  (NA)  CIWA: CIWA-Ar BP: 107/74 Pulse Rate: 76 COWS:    Allergies:  Allergies  Allergen Reactions  . Shellfish Allergy Hives and Itching    Home Medications:  (Not in a hospital admission)  OB/GYN Status:  No LMP for male patient.  General Assessment Data Is this a Tele or Face-to-Face Assessment?: Face-to-Face Is this an Initial Assessment or a Re-assessment for this encounter?: Initial Assessment Marital status: Single Maiden name: NA Is patient pregnant?: No Pregnancy Status: No Living Arrangements: Other relatives Can pt return to current living arrangement?: Yes Admission Status: Voluntary Is patient capable of signing voluntary admission?: Yes Referral Source: Self/Family/Friend Insurance type: Med Pay  Medical Screening Exam [facility] Walk-in ONLY) Medical Exam completed: Yes  Crisis Care Plan  Living Arrangements: Other relatives Legal Guardian:  (NA) Name of Psychiatrist: Pt cannot recall Name of Therapist: None  Education Status Is patient currently in school?: No Current Grade:  (NA) Highest grade of  school patient has completed:  (NA) Name of school:  (NA) Contact person:  (NA)  Risk to self with the past 6 months Suicidal Ideation: No Has patient been a risk to self within the past 6 months prior to admission? : No Suicidal Intent: No Has patient had any suicidal intent within the past 6 months prior to admission? : No Is patient at risk for suicide?: No Suicidal Plan?: No Has patient had any suicidal plan within the past 6 months prior to admission? : No Access to Means: No What has been your use of drugs/alcohol within the last 12 months?: Denies Previous Attempts/Gestures: No How many times?: 0 Other Self Harm Risks:  (NA) Triggers for Past Attempts: Unknown Intentional Self Injurious Behavior: None Family Suicide History: No Recent stressful life event(s): Other (Comment) (Pt denies any current stressors ) Persecutory voices/beliefs?: No Depression: No Depression Symptoms:  (NA) Substance abuse history and/or treatment for substance abuse?: No Suicide prevention information given to non-admitted patients: Not applicable  Risk to Others within the past 6 months Homicidal Ideation: No Does patient have any lifetime risk of violence toward others beyond the six months prior to admission? : No Thoughts of Harm to Others: No Current Homicidal Intent: No Current Homicidal Plan: No Access to Homicidal Means: No Identified Victim: NA History of harm to others?: No Assessment of Violence: None Noted Violent Behavior Description: NA Does patient have access to weapons?: No Criminal Charges Pending?: No Does patient have a court date: No Is patient on probation?: No  Psychosis Hallucinations: Visual Delusions: None noted  Mental Status Report Appearance/Hygiene: In scrubs Eye Contact: Good Motor Activity: Freedom of movement Speech: Unremarkable Level of Consciousness: Alert Mood: Pleasant Affect: Appropriate to circumstance Anxiety Level: Minimal Thought  Processes: Coherent, Relevant Judgement: Unimpaired Orientation: Person, Place, Time Obsessive Compulsive Thoughts/Behaviors: None  Cognitive Functioning Concentration: Good Memory: Recent Intact, Remote Intact IQ: Average Insight: Fair Impulse Control: Fair Appetite: Good Weight Loss: 0 Weight Gain: 0 Sleep: No Change Total Hours of Sleep: 7 Vegetative Symptoms: None  ADLScreening [facility] Assessment Services) Patient's cognitive ability adequate to safely complete daily activities?: Yes Patient able to express need for assistance with ADLs?: Yes Independently performs ADLs?: Yes (appropriate for developmental age)  Prior Inpatient Therapy Prior Inpatient Therapy: Yes Prior Therapy Dates: 2017 Prior Therapy Facilty/Provider(s): [facility] Reason for Treatment: MH issues  Prior Outpatient Therapy Prior Outpatient Therapy: Yes Prior Therapy Dates: Ongoing Prior Therapy Facilty/Provider(s): Pt cannot recall Reason for Treatment: MH issues Does patient have an ACCT team?: No Does patient have Intensive In-House Services?  : No Does patient have [facility] services? : No Does patient have P4CC services?: No  ADL Screening (condition at time of admission) Patient's cognitive ability adequate to safely complete daily activities?: Yes Is the patient deaf or have difficulty hearing?: No Does the patient have difficulty seeing, even when wearing glasses/contacts?: No Does the patient have difficulty concentrating, remembering, or making decisions?: No Patient able to express need for assistance with ADLs?: Yes Does the patient have difficulty dressing or bathing?: No Independently performs ADLs?: Yes (appropriate for developmental age) Does the patient have difficulty walking or climbing stairs?: No Weakness of Legs: None Weakness of Arms/Hands: None  Home Assistive Devices/Equipment Home Assistive Devices/Equipment: None  Therapy Consults (therapy consults require  a physician  order) PT Evaluation Needed: No OT Evalulation Needed: No SLP Evaluation Needed: No Abuse/Neglect Assessment (Assessment to be complete while patient is alone) Physical Abuse: Denies Verbal Abuse: Denies Sexual Abuse: Denies Exploitation of patient/patient's resources: Denies Self-Neglect: Denies Values / Beliefs Cultural Requests During Hospitalization: None Spiritual Requests During Hospitalization: None Consults Spiritual Care Consult Needed: No Social Work Consult Needed: No Merchant navy officer (For Healthcare) Does Patient Have a Medical Advance Directive?: No Would patient like information on creating a medical advance directive?: No - Patient declined    Additional Information 1:1 In Past 12 Months?: No CIRT Risk: No Elopement Risk: No Does patient have medical clearance?: Yes     Disposition: Patient is asking to be discharged as this Clinical research associate consults with Mesner MD to assist with evaluation. Mesner MD noted, "Sounds like worsening psychotic symptoms. States compliance. Will discuss with his aunt who apparently helps him. otherwise will consult TTS. TTS consult, no need for IVC. Patient wanting to leave. Reevaluated and not SI/HI. Psychosis is minor. Will seek help with his psychiatrist and contracts for safety and return if worsening symptoms. No indication for IVC, will dc.    Disposition Initial Assessment Completed for this Encounter: Yes Disposition of Patient: Other dispositions Other disposition(s): Other (Comment) (Pt to be discharged this date)  On Site Evaluation by:   Reviewed with Physician:    Alfredia Ferguson 05/25/2017 6:33 PM

## 2017-05-25 NOTE — ED Notes (Signed)
Bed: BLT9 Expected date:  Expected time:  Means of arrival:  Comments: [place], [place]

## 2017-05-25 NOTE — ED Triage Notes (Signed)
Patient with history of schizophrenia and bipolar disorder states he is feeling "manic" and states he feels "extra paranoid and scared of everyone." Patient unsure of medical history or what medication he takes daily. Patient reports he was dropped off at the ER by his auntie, who gives him his meds.

## 2017-05-25 NOTE — ED Provider Notes (Signed)
[facility]-EMERGENCY DEPT Provider Note   CSN: [id number] Arrival date & time: 05/25/17  1533     History   Chief Complaint Chief Complaint  Patient presents with  . Psychiatric Evaluation    HPI [name] is a 26 y.o. male.   Mental Health Problem  Presenting symptoms: bizarre behavior, delusional and paranoid behavior   Presenting symptoms: no suicidal thoughts, no suicidal threats and no suicide attempt   Degree of incapacity (severity):  Mild Onset quality:  Gradual Timing:  Constant Progression:  Worsening Chronicity:  Recurrent Context: drug abuse and medication   Context: not recent medication change   Treatment compliance:  Most of the time Ineffective treatments:  None tried   Past Medical History:  Diagnosis Date  . Depression     Patient Active Problem List   Diagnosis Date Noted  . 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 Cannabis use disorder, moderate, dependence (HCC) 05/10/2016    Past Surgical History:  Procedure Laterality Date  . BACK SURGERY         Home Medications    Prior to Admission medications   Medication Sig Start Date End Date Taking? Authorizing Provider  amantadine (SYMMETREL) 100 MG capsule Take 1 capsule (100 mg total) by mouth 2 (two) times daily. 05/30/16   Jimmy Footman, MD  benztropine (COGENTIN) 1 MG tablet Take 1 tablet (1 mg total) by mouth 2 (two) times daily. 06/03/16   Charm Rings, NP  paliperidone (INVEGA SUSTENNA) 234 MG/1.5ML SUSP injection Inject 234 mg into the muscle every 30 (thirty) days. 06/23/16   Jimmy Footman, MD  paliperidone (INVEGA) 6 MG 24 hr tablet Take 2 tablets (12 mg total) by mouth at bedtime. 05/30/16   Jimmy Footman, MD  paliperidone (INVEGA) 6 MG 24 hr tablet Take 1 tablet (6 mg total) by mouth at bedtime. 06/04/16   Charm Rings, NP  predniSONE (DELTASONE) 10 MG tablet Take 2 tablets (20 mg total) by mouth 2 (two) times daily. 07/23/16   Geoffery Lyons, MD    Family History History reviewed. No pertinent family history.  Social History Social History  Substance Use Topics  . Smoking status: Current Some Day Smoker  . Smokeless tobacco: Never Used  . Alcohol use No     Allergies   Shellfish allergy   Review of Systems Review of Systems  Psychiatric/Behavioral: Positive for paranoia. Negative for suicidal ideas.  All other systems reviewed and are negative.    Physical Exam Updated Vital Signs BP 107/74 (BP Location: Right Arm)   Pulse 76   Temp (!) 97.1 F (36.2 C) (Oral)   Resp 18   Ht 5\' 5"  (1.651 m)   Wt 72.6 kg (160 lb)   SpO2 99%   BMI 26.63 kg/m   Physical Exam  Constitutional: He appears well-developed and well-nourished.  HENT:  Head: Normocephalic and atraumatic.  Eyes: Conjunctivae and EOM are normal.  Neck: Normal range of motion.  Cardiovascular: Normal rate.   Pulmonary/Chest: Effort normal. No respiratory distress.  Abdominal: Soft. He exhibits no distension.  Musculoskeletal: Normal range of motion.  Neurological: He is alert.  Skin: Skin is warm and dry.  Psychiatric: His speech is tangential. His speech is not rapid and/or pressured. He is slowed. Thought content is paranoid and delusional. He expresses no homicidal and no suicidal ideation.  Nursing note and vitals reviewed.    ED Treatments / Results  Labs (all labs ordered are listed, but only abnormal results are displayed) Labs  Reviewed  COMPREHENSIVE METABOLIC PANEL - Abnormal; Notable for the following:       Result Value   Glucose, Bld 114 (*)    ALT 12 (*)    Alkaline Phosphatase 37 (*)    Total Bilirubin 1.3 (*)    All other components within normal limits  ACETAMINOPHEN LEVEL - Abnormal; Notable for the following:    Acetaminophen (Tylenol), Serum <10 (*)    All other components within normal limits  RAPID URINE DRUG SCREEN, HOSP PERFORMED - Abnormal; Notable for the following:    Tetrahydrocannabinol POSITIVE  (*)    All other components within normal limits  ETHANOL  SALICYLATE LEVEL  CBC    EKG  EKG Interpretation None       Radiology No results found.  Procedures Procedures (including critical care time)  Medications Ordered in ED Medications - No data to display   Initial Impression / Assessment and Plan / ED Course  I have reviewed the triage vital signs and the nursing notes.  Pertinent labs & imaging results that were available during my care of the patient were reviewed by me and considered in my medical decision making (see chart for details).    Sounds like worsening psychotic symptoms. States compliance. Will discuss with his aunt who apparently helps him. otherwise will consult TTS.   TTS consult, no need for IVC. Patient wanting to leave. Reevaluated and not SI/HI. Psychosis is minor. Will seek help with his psychiatrist and contracts for safety and return if worsening symptoms. No indication for IVC, will dc.   Final Clinical Impressions(s) / ED Diagnoses   Final diagnoses:  Psychosis, unspecified psychosis type      Marily Memos, MD 05/25/17 1816

## 2018-01-15 ENCOUNTER — Other Ambulatory Visit: Payer: Self-pay

## 2018-01-15 ENCOUNTER — Encounter (HOSPITAL_COMMUNITY): Payer: Self-pay

## 2018-01-15 ENCOUNTER — Emergency Department (HOSPITAL_COMMUNITY)
Admission: EM | Admit: 2018-01-15 | Discharge: 2018-01-16 | Disposition: A | Discharge status: Other Acute Inpatient Hospital | Payer: Medicaid Other | Attending: Emergency Medicine | Admitting: Emergency Medicine

## 2018-01-15 DIAGNOSIS — F22 Delusional disorders: Secondary | ICD-10-CM | POA: Insufficient documentation

## 2018-01-15 DIAGNOSIS — R0602 Shortness of breath: Secondary | ICD-10-CM | POA: Insufficient documentation

## 2018-01-15 DIAGNOSIS — Z79899 Other long term (current) drug therapy: Secondary | ICD-10-CM | POA: Insufficient documentation

## 2018-01-15 DIAGNOSIS — F17210 Nicotine dependence, cigarettes, uncomplicated: Secondary | ICD-10-CM | POA: Insufficient documentation

## 2018-01-15 DIAGNOSIS — R062 Wheezing: Secondary | ICD-10-CM | POA: Insufficient documentation

## 2018-01-15 DIAGNOSIS — F2081 Schizophreniform disorder: Secondary | ICD-10-CM | POA: Insufficient documentation

## 2018-01-15 DIAGNOSIS — F29 Unspecified psychosis not due to a substance or known physiological condition: Secondary | ICD-10-CM | POA: Diagnosis present

## 2018-01-15 LAB — SALICYLATE LEVEL: Salicylate Lvl: <7 mg/dL (ref 2.8–30.0)

## 2018-01-15 LAB — RAPID URINE DRUG SCREEN, HOSP PERFORMED
Amphetamines: NOT DETECTED
Barbiturates: NOT DETECTED
Benzodiazepines: NOT DETECTED
Cocaine: NOT DETECTED
Opiates: NOT DETECTED
Tetrahydrocannabinol: POSITIVE — AB

## 2018-01-15 LAB — COMPREHENSIVE METABOLIC PANEL
ALT: 13 U/L — ABNORMAL LOW (ref 17–63)
AST: 22 U/L (ref 15–41)
Albumin: 4.7 g/dL (ref 3.5–5.0)
Alkaline Phosphatase: 47 U/L (ref 38–126)
Anion gap: 10 (ref 5–15)
BUN: 16 mg/dL (ref 6–20)
CO2: 29 mmol/L (ref 22–32)
Calcium: 9.7 mg/dL (ref 8.9–10.3)
Chloride: 102 mmol/L (ref 101–111)
Creatinine, Ser: 1.15 mg/dL (ref 0.61–1.24)
GFR calc Af Amer: >60 mL/min (ref >60)
GFR calc non Af Amer: >60 mL/min (ref >60)
Glucose, Bld: 103 mg/dL — ABNORMAL HIGH (ref 65–99)
Potassium: 3.9 mmol/L (ref 3.5–5.1)
Sodium: 141 mmol/L (ref 135–145)
Total Bilirubin: 1 mg/dL (ref 0.3–1.2)
Total Protein: 7.5 g/dL (ref 6.5–8.1)

## 2018-01-15 LAB — CBC
HCT: 47.3 % (ref 39.0–52.0)
Hemoglobin: 16.6 g/dL (ref 13.0–17.0)
MCH: 31.1 pg (ref 26.0–34.0)
MCHC: 35.1 g/dL (ref 30.0–36.0)
MCV: 88.6 fL (ref 78.0–100.0)
Platelets: 235 10*3/uL (ref 150–400)
RBC: 5.34 MIL/uL (ref 4.22–5.81)
RDW: 12.8 % (ref 11.5–15.5)
WBC: 7 10*3/uL (ref 4.0–10.5)

## 2018-01-15 LAB — ACETAMINOPHEN LEVEL: Acetaminophen (Tylenol), Serum: <10 ug/mL — ABNORMAL LOW (ref 10–30)

## 2018-01-15 LAB — ETHANOL: Alcohol, Ethyl (B): <10 mg/dL (ref <10)

## 2018-01-15 NOTE — ED Notes (Signed)
[id number] Expected date:  Expected time:  Means of arrival:  Comments: Hold for room 3

## 2018-01-15 NOTE — ED Provider Notes (Signed)
[facility]-EMERGENCY DEPT Provider Note   CSN: [id number] Arrival date & time: 01/15/18  1346     History   Chief Complaint Chief Complaint  Patient presents with  . Medical Clearance    HPI [name] is a 27 y.o. male.  The history is provided by the patient. No language interpreter was used.  Mental Health Problem  Presenting symptoms: paranoid behavior   Presenting symptoms: no aggressive behavior, no agitation, no delusions, no depression and no hallucinations   Degree of incapacity (severity):  Moderate Onset quality:  Gradual Timing:  Constant Progression:  Worsening Chronicity:  Chronic Treatment compliance:  Untreated Relieved by:  Nothing Worsened by:  Nothing Ineffective treatments:  None tried Associated symptoms: no abdominal pain, no chest pain, no fatigue and no headaches   Wheezing   This is a chronic problem. The current episode started 1 to 2 hours ago. The problem occurs constantly. The problem has been resolved. Pertinent negatives include no chest pain, no fever, no abdominal pain, no vomiting, no diarrhea, no dysuria, no headaches, no rhinorrhea, no neck pain, no cough, no sputum production and no rash. The problem's precipitants include pollens. He has tried beta-agonist inhalers for the symptoms. The treatment provided significant relief. His past medical history is significant for asthma.    Past Medical History:  Diagnosis Date  . Depression     Patient Active Problem List   Diagnosis Date Noted  . 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 Cannabis use disorder, moderate, dependence (HCC) 05/10/2016    Past Surgical History:  Procedure Laterality Date  . BACK SURGERY          Home Medications    Prior to Admission medications   Medication Sig Start Date End Date Taking? Authorizing Provider  amantadine (SYMMETREL) 100 MG capsule Take 1 capsule (100 mg total) by mouth 2 (two) times daily. 05/30/16    Jimmy Footman, MD  benztropine (COGENTIN) 1 MG tablet Take 1 tablet (1 mg total) by mouth 2 (two) times daily. 06/03/16   Charm Rings, NP  paliperidone (INVEGA SUSTENNA) 234 MG/1.5ML SUSP injection Inject 234 mg into the muscle every 30 (thirty) days. 06/23/16   Jimmy Footman, MD  paliperidone (INVEGA) 6 MG 24 hr tablet Take 2 tablets (12 mg total) by mouth at bedtime. 05/30/16   Jimmy Footman, MD  paliperidone (INVEGA) 6 MG 24 hr tablet Take 1 tablet (6 mg total) by mouth at bedtime. 06/04/16   Charm Rings, NP  predniSONE (DELTASONE) 10 MG tablet Take 2 tablets (20 mg total) by mouth 2 (two) times daily. 07/23/16   Geoffery Lyons, MD    Family History No family history on file.  Social History Social History   Tobacco Use  . Smoking status: Current Some Day Smoker  . Smokeless tobacco: Never Used  Substance Use Topics  . Alcohol use: No  . Drug use: Yes    Types: Marijuana    Comment: rare     Allergies   Shellfish allergy   Review of Systems Review of Systems  Constitutional: Negative for chills, diaphoresis, fatigue and fever.  HENT: Negative for congestion and rhinorrhea.   Eyes: Negative for visual disturbance.  Respiratory: Positive for shortness of breath and wheezing. Negative for cough, sputum production, chest tightness and stridor.   Cardiovascular: Negative for chest pain.  Gastrointestinal: Negative for abdominal pain, constipation, diarrhea, nausea and vomiting.  Genitourinary: Negative for dysuria, flank pain and frequency.  Musculoskeletal: Negative for back  pain, neck pain and neck stiffness.  Skin: Negative for rash and wound.  Neurological: Negative for light-headedness and headaches.  Psychiatric/Behavioral: Positive for paranoia. Negative for agitation and hallucinations.  All other systems reviewed and are negative.    Physical Exam Updated Vital Signs BP (!) 135/94 (BP Location: Right Arm)   Pulse 86    Temp (!) 97.5 F (36.4 C) (Oral)   Resp 18   Ht 5\' 5"  (1.651 m)   Wt 74.8 kg (165 lb)   SpO2 96%   BMI 27.46 kg/m   Physical Exam  Constitutional: He is oriented to person, place, and time. He appears well-developed and well-nourished. No distress.  HENT:  Head: Normocephalic.  Nose: Nose normal.  Mouth/Throat: Oropharynx is clear and moist. No oropharyngeal exudate.  Eyes: Pupils are equal, round, and reactive to light. Conjunctivae and EOM are normal.  Neck: Normal range of motion. Neck supple.  Cardiovascular: Normal rate.  No murmur heard. Pulmonary/Chest: Effort normal. No stridor. No respiratory distress. He has wheezes. He has no rales. He exhibits no tenderness.  Abdominal: Soft. He exhibits no distension. There is no tenderness.  Musculoskeletal: He exhibits no edema or tenderness.  Neurological: He is alert and oriented to person, place, and time. No sensory deficit. He exhibits normal muscle tone.  Skin: Capillary refill takes less than 2 seconds. He is not diaphoretic. No erythema. No pallor.  Psychiatric: He has a normal mood and affect.  Nursing note and vitals reviewed.    ED Treatments / Results  Labs (all labs ordered are listed, but only abnormal results are displayed) Labs Reviewed  COMPREHENSIVE METABOLIC PANEL - Abnormal; Notable for the following components:      Result Value   Glucose, Bld 103 (*)    ALT 13 (*)    All other components within normal limits  ACETAMINOPHEN LEVEL - Abnormal; Notable for the following components:   Acetaminophen (Tylenol), Serum <10 (*)    All other components within normal limits  RAPID URINE DRUG SCREEN, HOSP PERFORMED - Abnormal; Notable for the following components:   Tetrahydrocannabinol POSITIVE (*)    All other components within normal limits  ETHANOL  SALICYLATE LEVEL  CBC    EKG None  Radiology No results found.  Procedures Procedures (including critical care time)  Medications Ordered in  ED Medications - No data to display   Initial Impression / Assessment and Plan / ED Course  I have reviewed the triage vital signs and the nursing notes.  Pertinent labs & imaging results that were available during my care of the patient were reviewed by me and considered in my medical decision making (see chart for details).     [name] is a 27 y.o. male with a reported past medical history significant for paranoid schizophrenia and asthma who presents with wheezing and shortness of breath as well as possible mental health concern.  According to patient, he was outside "getting some air" when he began having shortness of breath.  He reports he has not had an inhaler for some time and the pollen in the spring time has gotten his asthma worsened.  He reports that he called EMS for breathing troubles and was given a breathing treatment.  He reports that the wheezing has greatly improved and her shortness breath is resolved.  According to EMS report to nursing, patient expressed some concerns of depression or paranoia.  He reports that he is always worried that "people are out to get him".  He reports that this is been a chronic problem but he denies any other complaints today.  He specifically denies any SI or HI.  He denies any hallucinations.  He denies any drug use or alcohol use.  He denies any other complaints.  Next  On exam, patient had mild wheezing.  Patient is in no respiratory distress.  Abdomen and chest are nontender.  Patient is alert and oriented.  On my initial examination, patient does not appear to be acutely suicidal or homicidal.  Patient reports that he is paranoid about other people but he reports this is unchanged from prior.  He reports that he was on medications before but stopped taking them.  He reports that he was primary here for his wheezing and now that is better he would like to go home.  A conversation was held the patient and he agreed to talk to the psychiatry  team about his paranoia.  Do not feel patient needs involuntary commitment at this time.  Patient had screening laboratory testing.    Patient's laboratory testing is all reassuring.  Patient is medically cleared for further management.  Anticipate following up on TTS recommendations.    Final Clinical Impressions(s) / ED Diagnoses   Final diagnoses:  Paranoia (HCC)    Clinical Impression: 1. Paranoia (HCC)     Disposition: Awaiting psychiatric recommendations and reassessment.     Liborio Saccente, Canary Brim, MD 01/15/18 [phone]

## 2018-01-15 NOTE — ED Notes (Signed)
[facility]: [facility] Expected date:  Expected time:  Means of arrival:  Comments: 

## 2018-01-15 NOTE — ED Notes (Addendum)
Pt sleeping at present, no distress noted, calm & cooperative. No bizarre behavior noted at present.  Pt paranoid that people may want to harm him.  Monitoring for safety, Q 15 min checks in effect.

## 2018-01-15 NOTE — [facility] Counselor (Signed)
Disposition:   Elta GuadeloupeLaurie Parks, NP, observe and monitor overnight for safety. Re-assess in the morning.

## 2018-01-15 NOTE — ED Notes (Addendum)
Pt has 1 personal belongings bag behind the triage nursing station. 

## 2018-01-15 NOTE — ED Notes (Addendum)
Patient moved from triage room 4 to room 37.  Belongings placed in locker 37.  Patient alert, oriented.  Patient reports EMS brought him in after his brother called because patient says he cannot be around children and his brother has children.  Patient said he could not be around his brother's children that it made him have difficulty breathing.  He denies HI, SI, or AVH.  Reports he is not on any medications and does not have a psychiatric history.
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
## 2018-01-15 NOTE — ED Triage Notes (Signed)
[facility]states patient stated he was wheezing in his right lung because "his GF didn't have his babies" patient also stated he was a paranoid schizophrenic that doesn't know if he is in reality-patient was given 5 mg of Albuterol in route-lungs clear after treatment

## 2018-01-15 NOTE — ED Triage Notes (Signed)
Pt reports that he is tired of life. Pt reports that his mind is gone, and is not sure if he is alive or dead. Pt states he is unsure of reality.  Pt is not sure if he is having auditory and visual hallucination. Pt reports that he is scared at time and afraid that people want to harm him. " this life has not felt like I am meant to be in it". Pt reports that he is paranoid at time.  Pt is talkative, and is cooperative and calm.

## 2018-01-16 ENCOUNTER — Encounter (HOSPITAL_COMMUNITY): Payer: Self-pay | Admitting: *Deleted

## 2018-01-16 ENCOUNTER — Other Ambulatory Visit: Payer: Self-pay

## 2018-01-16 ENCOUNTER — Inpatient Hospital Stay (HOSPITAL_COMMUNITY)
Admission: AD | Admit: 2018-01-16 | Discharge: 2018-01-22 | DRG: 885 | Disposition: A | Discharge status: Home / Self Care | Payer: Medicaid Other | Source: Intra-hospital | Attending: Psychiatry | Admitting: Psychiatry

## 2018-01-16 DIAGNOSIS — Z791 Long term (current) use of non-steroidal anti-inflammatories (NSAID): Secondary | ICD-10-CM | POA: Diagnosis not present

## 2018-01-16 DIAGNOSIS — Z91013 Allergy to seafood: Secondary | ICD-10-CM | POA: Diagnosis not present

## 2018-01-16 DIAGNOSIS — F419 Anxiety disorder, unspecified: Secondary | ICD-10-CM | POA: Diagnosis present

## 2018-01-16 DIAGNOSIS — F209 Schizophrenia, unspecified: Principal | ICD-10-CM | POA: Diagnosis present

## 2018-01-16 DIAGNOSIS — R0602 Shortness of breath: Secondary | ICD-10-CM | POA: Diagnosis not present

## 2018-01-16 DIAGNOSIS — Z638 Other specified problems related to primary support group: Secondary | ICD-10-CM | POA: Diagnosis not present

## 2018-01-16 DIAGNOSIS — R45851 Suicidal ideations: Secondary | ICD-10-CM

## 2018-01-16 DIAGNOSIS — Z590 Homelessness: Secondary | ICD-10-CM | POA: Diagnosis not present

## 2018-01-16 DIAGNOSIS — F29 Unspecified psychosis not due to a substance or known physiological condition: Secondary | ICD-10-CM | POA: Diagnosis not present

## 2018-01-16 DIAGNOSIS — F481 Depersonalization-derealization syndrome: Secondary | ICD-10-CM | POA: Diagnosis present

## 2018-01-16 DIAGNOSIS — F1290 Cannabis use, unspecified, uncomplicated: Secondary | ICD-10-CM

## 2018-01-16 DIAGNOSIS — G4700 Insomnia, unspecified: Secondary | ICD-10-CM | POA: Diagnosis present

## 2018-01-16 DIAGNOSIS — Z560 Unemployment, unspecified: Secondary | ICD-10-CM | POA: Diagnosis not present

## 2018-01-16 DIAGNOSIS — Z79899 Other long term (current) drug therapy: Secondary | ICD-10-CM | POA: Diagnosis not present

## 2018-01-16 DIAGNOSIS — F200 Paranoid schizophrenia: Secondary | ICD-10-CM | POA: Diagnosis not present

## 2018-01-16 DIAGNOSIS — R451 Restlessness and agitation: Secondary | ICD-10-CM | POA: Diagnosis present

## 2018-01-16 DIAGNOSIS — F17210 Nicotine dependence, cigarettes, uncomplicated: Secondary | ICD-10-CM

## 2018-01-16 DIAGNOSIS — J45909 Unspecified asthma, uncomplicated: Secondary | ICD-10-CM | POA: Diagnosis not present

## 2018-01-16 MED ORDER — DIPHENHYDRAMINE HCL 50 MG/ML IJ SOLN
50.0000 mg | Freq: Once | INTRAMUSCULAR | Status: AC
  Administered 2018-01-16: 50 mg via INTRAMUSCULAR
  Filled 2018-01-16: qty 1

## 2018-01-16 MED ORDER — TRAZODONE HCL 50 MG PO TABS
50.0000 mg | ORAL_TABLET | Freq: Every evening | ORAL | Status: DC | PRN
  Administered 2018-01-17 – 2018-01-18 (×2): 50 mg via ORAL
  Filled 2018-01-16 (×3): qty 1

## 2018-01-16 MED ORDER — ZIPRASIDONE MESYLATE 20 MG IM SOLR
10.0000 mg | Freq: Once | INTRAMUSCULAR | Status: AC
  Administered 2018-01-16: 10 mg via INTRAMUSCULAR
  Filled 2018-01-16: qty 20

## 2018-01-16 MED ORDER — BENZTROPINE MESYLATE 1 MG PO TABS: 1.0000 mg | ORAL_TABLET | Freq: Two times a day (BID) | ORAL | Status: DC

## 2018-01-16 MED ORDER — PALIPERIDONE ER 6 MG PO TB24
6.0000 mg | ORAL_TABLET | Freq: Every day | ORAL | Status: DC
  Administered 2018-01-17 – 2018-01-22 (×6): 6 mg via ORAL
  Filled 2018-01-16 (×7): qty 1

## 2018-01-16 MED ORDER — MAGNESIUM HYDROXIDE 400 MG/5ML PO SUSP
30.0000 mL | Freq: Every day | ORAL | Status: DC | PRN
Start: 2018-01-16 — End: 2018-01-22

## 2018-01-16 MED ORDER — ACETAMINOPHEN 325 MG PO TABS: 650.0000 mg | ORAL_TABLET | Freq: Four times a day (QID) | ORAL | Status: DC | PRN

## 2018-01-16 MED ORDER — PALIPERIDONE ER 6 MG PO TB24: 6.0000 mg | ORAL_TABLET | Freq: Every day | ORAL | Status: DC

## 2018-01-16 MED ORDER — ALUM & MAG HYDROXIDE-SIMETH 200-200-20 MG/5ML PO SUSP: 30.0000 mL | ORAL | Status: DC | PRN

## 2018-01-16 MED ORDER — LORAZEPAM 2 MG/ML IJ SOLN
2.0000 mg | Freq: Once | INTRAMUSCULAR | Status: AC
  Administered 2018-01-16: 2 mg via INTRAMUSCULAR
  Filled 2018-01-16: qty 1

## 2018-01-16 MED ORDER — HYDROXYZINE HCL 25 MG PO TABS
25.0000 mg | ORAL_TABLET | Freq: Three times a day (TID) | ORAL | Status: DC | PRN
  Administered 2018-01-17 – 2018-01-18 (×2): 25 mg via ORAL
  Filled 2018-01-16 (×2): qty 1

## 2018-01-16 MED ORDER — BENZTROPINE MESYLATE 1 MG PO TABS
1.0000 mg | ORAL_TABLET | Freq: Two times a day (BID) | ORAL | Status: DC
Start: 2018-01-17 — End: 2018-01-18
  Administered 2018-01-17 – 2018-01-18 (×3): 1 mg via ORAL
  Filled 2018-01-16 (×5): qty 1

## 2018-01-16 MED ORDER — ZIPRASIDONE MESYLATE 20 MG IM SOLR: 20.0000 mg | Freq: Two times a day (BID) | INTRAMUSCULAR | Status: DC | PRN

## 2018-01-16 MED ORDER — LORAZEPAM 1 MG PO TABS: 1.0000 mg | ORAL_TABLET | ORAL | Status: DC | PRN

## 2018-01-16 NOTE — ED Notes (Signed)
Pt A&O x 3, no distress noted, calm & cooperative.  [facility] and [facility] at 10:00[facility]  Monitoring for safety, Q 15 min checks in effect.

## 2018-01-16 NOTE — Progress Notes (Signed)
[name] is a 27 year old male pt admitted on involuntary basis. On admission, [name] appears anxious and paranoid and hesitant to provide answers as to why he is in the hospital. He reports that he came in because he was having a breathing problem and reports people were asking him questions and now he is over here. When asked about some of the comments he made about thinking that he is losing his mind and that he is tired of life he just rambled on about not meaning anything by it and repeating about how he just wanted to go home. He does endorse marijuana usage and denies any other substance abuse issue. When asked about medicine he reports that he is not on any medications and when asked if he is suppose to be he does affirm that he is but reports that he "can't take medications like that, I'm afraid I won't wake up, I can't do it". He does endorse paranoia but dismisses any significance of it. He reports that he lives with his aunt and reports that he will go back there upon discharge. [name] was singularly focused on going home and how he can go about it. He was provided information in regards to process and he was able to verbalize understanding. Juddson was oriented to the unit and safety maintained.

## 2018-01-16 NOTE — ED Notes (Signed)
Pt resting at present, no distress noted.  Will continue to monitor.

## 2018-01-16 NOTE — Consult Note (Signed)
[facility] Consult   Reason for Consult:  Psychosis Referring Physician:  EDP Patient Identification: [name] MRN:  [id number] Principal Diagnosis: Psychosis [facility]) Diagnosis:   Patient Active Problem List   Diagnosis Date Noted  . Schizophreniform disorder ([place]) [F20.81] 05/12/2016  . Cannabis use disorder, moderate, dependence ([place]) [F12.20] 05/10/2016    Total Time spent with patient: 30 minutes  Subjective:   [name] is a 27 y.o. male patient admitted with SI and paranoia.  HPI:   [name] reports that he was admitted to the hospital because he couldn't breath. He is unable to explain why he ended up in the acute psychiatric unit. He denies SI, HI or AVH. He denies a history of psychiatric illness although per chart review he has a history of schizophreniform disorder. He denies problems with sleep or appetite. He denies problems with anxiety. On admission, he reported SI and paranoia. He reports feeling confused if he was dead or alive. He was given a one time dose of Geodon 10 mg daily, Ativan 2 mg and Benadryl 50 mg this morning.   Past Psychiatric History: Schizophreniform disorder and marijuana use disorder.   Risk to Self: Suicidal Ideation: No Suicidal Intent: No Is patient at risk for suicide?: No Suicidal Plan?: No Access to Means: No What has been your use of drugs/alcohol within the last 12 months?: pt denies How many times?: 0 Other Self Harm Risks: pt denies Triggers for Past Attempts: None known Intentional Self Injurious Behavior: None Risk to Others: Homicidal Ideation: No Thoughts of Harm to Others: No Current Homicidal Intent: No Current Homicidal Plan: No Access to Homicidal Means: No Identified Victim: n/a History of harm to others?: No Assessment of Violence: None Noted Violent Behavior Description: none noted Does patient have access to weapons?: No Criminal Charges Pending?: No Does patient have a court date: No Prior  Inpatient Therapy: Prior Inpatient Therapy: No Prior Outpatient Therapy: Prior Outpatient Therapy: No Does patient have an ACCT team?: No Does patient have Intensive In-House Services?  : No Does patient have [facility] services? : No Does patient have P4CC services?: No  Past Medical History:  Past Medical History:  Diagnosis Date  . Depression     Past Surgical History:  Procedure Laterality Date  . BACK SURGERY     Family History: No family history on file. Family Psychiatric  History: Denies Social History:  Social History   Substance and Sexual Activity  Alcohol Use No     Social History   Substance and Sexual Activity  Drug Use Yes  . Types: Marijuana   Comment: rare    Social History   Socioeconomic History  . Marital status: Single    Spouse name: Not on file  . Number of children: Not on file  . Years of education: Not on file  . Highest education level: Not on file  Occupational History  . Not on file  Social Needs  . Financial resource strain: Not on file  . Food insecurity:    Worry: Not on file    Inability: Not on file  . Transportation needs:    Medical: Not on file    Non-medical: Not on file  Tobacco Use  . Smoking status: Current Some Day Smoker  . Smokeless tobacco: Never Used  Substance and Sexual Activity  . Alcohol use: No  . Drug use: Yes    Types: Marijuana    Comment: rare  . Sexual activity: Yes  Birth control/protection: Condom  Lifestyle  . Physical activity:    Days per week: Not on file    Minutes per session: Not on file  . Stress: Not on file  Relationships  . Social connections:    Talks on phone: Not on file    Gets together: Not on file    Attends religious service: Not on file    Active member of club or organization: Not on file    Attends meetings of clubs or organizations: Not on file    Relationship status: Not on file  Other Topics Concern  . Not on file  Social History Narrative  . Not on file    Additional Social History: He lives with his aunt. He has a good relationship with her. He is unemployed. He denies alcohol use. He denies illicit substance use but UDS is positive for marijuana.     Allergies:   Allergies  Allergen Reactions  . Shellfish Allergy Hives and Itching    Labs:  Results for orders placed or performed during the hospital encounter of 01/15/18 (from the past 48 hour(s))  Rapid urine drug screen (hospital performed)     Status: Abnormal   Collection Time: 01/15/18  4:23 PM  Result Value Ref Range   Opiates NONE DETECTED NONE DETECTED   Cocaine NONE DETECTED NONE DETECTED   Benzodiazepines NONE DETECTED NONE DETECTED   Amphetamines NONE DETECTED NONE DETECTED   Tetrahydrocannabinol POSITIVE (A) NONE DETECTED   Barbiturates NONE DETECTED NONE DETECTED    Comment: (NOTE) DRUG SCREEN FOR MEDICAL PURPOSES ONLY.  IF CONFIRMATION IS NEEDED FOR ANY PURPOSE, NOTIFY LAB WITHIN 5 DAYS. LOWEST DETECTABLE LIMITS FOR URINE DRUG SCREEN Drug Class                     Cutoff (ng/mL) Amphetamine and metabolites    1000 Barbiturate and metabolites    200 Benzodiazepine                 409 Tricyclics and metabolites     300 Opiates and metabolites        300 Cocaine and metabolites        300 THC                            50 Performed at [facility], [place] [address] [place], [place] [address]   Comprehensive metabolic panel     Status: Abnormal   Collection Time: 01/15/18  4:46 PM  Result Value Ref Range   Sodium 141 135 - 145 mmol/L   Potassium 3.9 3.5 - 5.1 mmol/L   Chloride 102 101 - 111 mmol/L   CO2 29 22 - 32 mmol/L   Glucose, Bld 103 (H) 65 - 99 mg/dL   BUN 16 6 - 20 mg/dL   Creatinine, Ser 1.15 0.61 - 1.24 mg/dL   Calcium 9.7 8.9 - 10.3 mg/dL   Total Protein 7.5 6.5 - 8.1 g/dL   Albumin 4.7 3.5 - 5.0 g/dL   AST 22 15 - 41 U/L   ALT 13 (L) 17 - 63 U/L   Alkaline Phosphatase 47 38 - 126 U/L   Total Bilirubin 1.0 0.3 - 1.2 mg/dL    GFR calc non Af Amer >60 >60 mL/min   GFR calc Af Amer >60 >60 mL/min    Comment: (NOTE) The eGFR has been calculated using the CKD EPI equation. This calculation has not  been validated in all clinical situations. eGFR's persistently <60 mL/min signify possible Chronic Kidney Disease.    Anion gap 10 5 - 15    Comment: Performed at [facility], [place] [address] [place], [place] [address]  Ethanol     Status: None   Collection Time: 01/15/18  4:46 PM  Result Value Ref Range   Alcohol, Ethyl (B) <10 <10 mg/dL    Comment:        LOWEST DETECTABLE LIMIT FOR SERUM ALCOHOL IS 10 mg/dL FOR MEDICAL PURPOSES ONLY Performed at [facility] [address], [place], [place] [address]   Salicylate level     Status: None   Collection Time: 01/15/18  4:46 PM  Result Value Ref Range   Salicylate Lvl <8.3 2.8 - 30.0 mg/dL    Comment: Performed at [facility], [place] [address] [place], [place] [address]  Acetaminophen level     Status: Abnormal   Collection Time: 01/15/18  4:46 PM  Result Value Ref Range   Acetaminophen (Tylenol), Serum <10 (L) 10 - 30 ug/mL    Comment:        THERAPEUTIC CONCENTRATIONS VARY SIGNIFICANTLY. A RANGE OF 10-30 ug/mL MAY BE AN EFFECTIVE CONCENTRATION FOR MANY PATIENTS. HOWEVER, SOME ARE BEST TREATED AT CONCENTRATIONS OUTSIDE THIS RANGE. ACETAMINOPHEN CONCENTRATIONS >150 ug/mL AT 4 HOURS AFTER INGESTION AND >50 ug/mL AT 12 HOURS AFTER INGESTION ARE OFTEN ASSOCIATED WITH TOXIC REACTIONS. Performed at [facility], [place] [address] [place], [place] [address]   cbc     Status: None   Collection Time: 01/15/18  4:46 PM  Result Value Ref Range   WBC 7.0 4.0 - 10.5 K/uL   RBC 5.34 4.22 - 5.81 MIL/uL   Hemoglobin 16.6 13.0 - 17.0 g/dL   HCT 47.3 39.0 - 52.0 %   MCV 88.6 78.0 - 100.0 fL   MCH 31.1 26.0 - 34.0 pg   MCHC 35.1 30.0 - 36.0 g/dL   RDW 12.8 11.5 - 15.5 %   Platelets 235  150 - 400 K/uL    Comment: Performed at [facility], [place] [address] [place], [place] [address]    No current facility-administered medications for this encounter.    Current Outpatient Medications  Medication Sig Dispense Refill  . paliperidone (INVEGA SUSTENNA) 234 MG/1.5ML SUSP injection Inject 234 mg into the muscle every 30 (thirty) days. 0.9 mL 0  . amantadine (SYMMETREL) 100 MG capsule Take 1 capsule (100 mg total) by mouth 2 (two) times daily. 60 capsule 0  . benztropine (COGENTIN) 1 MG tablet Take 1 tablet (1 mg total) by mouth 2 (two) times daily. 60 tablet 0  . paliperidone (INVEGA) 6 MG 24 hr tablet Take 2 tablets (12 mg total) by mouth at bedtime. 60 tablet 0  . paliperidone (INVEGA) 6 MG 24 hr tablet Take 1 tablet (6 mg total) by mouth at bedtime. 30 tablet 0  . predniSONE (DELTASONE) 10 MG tablet Take 2 tablets (20 mg total) by mouth 2 (two) times daily. 20 tablet 0    Musculoskeletal: Strength & Muscle Tone: within normal limits Gait & Station: normal Patient leans: N/A  Psychiatric Specialty Exam: Physical Exam  Constitutional: He is oriented to person, place, and time. He appears well-developed and well-nourished.  HENT:  Head: Normocephalic and atraumatic.  Neck: Normal range of motion.  Respiratory: Effort normal.  Musculoskeletal: Normal range of motion.  Neurological: He is alert and oriented to person, place,  and time.  Psychiatric: His speech is normal and behavior is normal. Thought content normal. His affect is blunt. Cognition and memory are impaired. He expresses impulsivity.    Review of Systems  Psychiatric/Behavioral: Positive for substance abuse. Negative for hallucinations and suicidal ideas. The patient is not nervous/anxious and does not have insomnia.   All other systems reviewed and are negative.   Blood pressure 126/81, pulse 67, temperature 97.7 F (36.5 C), temperature source Oral, resp. rate 16, height _0  (1.651 m),  weight 74.8 kg (165 lb), SpO2 98 %.Body mass index is 27.46 kg/m.  General Appearance: Fairly Groomed, young, African American male wearing paper hospital scrubs and sitting up in bed. NAD.   Eye Contact:  Good  Speech:  Clear and Coherent and Normal Rate  Volume:  Normal  Mood:  Euthymic  Affect:  Constricted  Thought Process:  Goal Directed, Linear and Descriptions of Associations: Intact  Orientation:  Full (Time, Place, and Person)  Thought Content:  Delusions and Paranoid Ideation  Suicidal Thoughts:  No  Homicidal Thoughts:  No  Memory:  Immediate;   Fair Recent;   Fair Remote;   Fair  Judgement:  Impaired  Insight:  Lacking  Psychomotor Activity:  Normal  Concentration:  Concentration: Fair and Attention Span: Fair  Recall:  [employer] of Knowledge:  Poor  Language:  Fair  Akathisia:  No  Handed:  Right  AIMS (if indicated):   N/A  Assets:  Housing Social Support  ADL's:  Intact  Cognition: Impaired due to psychiatric illness.   Sleep:   Okay   Assessment:  [name] is a 27 y.o. male who was admitted with SI and paranoia. He has a history of schizophreniform disorder. He received behavioral medications this morning for anxiety which is likely secondary to hallucinations. He warrants inpatient psychiatric hospitalization for stabilization and treatment.   Treatment Plan Summary: Daily contact with patient to assess and evaluate symptoms and progress in treatment and Medication management  -Restart Invega 6 mg daily for psychosis.  -Restart Cogentin 1 mg BID for EPS.   Disposition: Recommend psychiatric Inpatient admission when medically cleared.  Faythe Dingwall, DO 01/16/2018 12:14 PM

## 2018-01-16 NOTE — ED Notes (Addendum)
Pt states he is feeling hot and crazy at this moment, requesting meds for anxiety.  When asked if he is hearing voices or seeing things he states he does not know.  Pt continues to state he has difficulty breathing.  Lung sounds clear, no resp distress noted.  Skin color good, no JVD noted.  Pt cooperative with injections.

## 2018-01-16 NOTE — ED Notes (Signed)
Pt preferred to go home today, but has been calm and cooperative, sleeping all day.

## 2018-01-16 NOTE — Tx Team (Signed)
Initial Treatment Plan 01/16/2018 11:11 PM Adrian GivensMarlo Fitzsimons ZOX:[id number]    PATIENT STRESSORS: Medication change or noncompliance   PATIENT STRENGTHS: Average or above average intelligence Capable of independent living General fund of knowledge   PATIENT IDENTIFIED PROBLEMS: Paranoia Psychosis "I don't know I just want to go home"                     DISCHARGE CRITERIA:  Ability to meet basic life and health needs Improved stabilization in mood, thinking, and/or behavior Verbal commitment to aftercare and medication compliance  PRELIMINARY DISCHARGE PLAN: Attend aftercare/continuing care group Return to previous living arrangement  PATIENT/FAMILY INVOLVEMENT: This treatment plan has been presented to and reviewed with the patient, Adrian GivensMarlo Eddleman, and/or family member, .  The patient and family have been given the opportunity to ask questions and make suggestions.  Brian Kocourek, [place], [place]RN 01/16/2018, 11:11 PM

## 2018-01-16 NOTE — ED Notes (Signed)
[employer] transport requested. 

## 2018-01-16 NOTE — BH Assessment (Signed)
[facility]  Per Juanetta BeetsJacqueline Norman, DO, this pt requires psychiatric hospitalization.  Malva LimesLinsey Strader, RN, [id number]; [facility] will be ready to receive pt at 21:00.  Dr Sharma CovertNorman also finds that pt meets criteria for IVC, which she has initiated.  [place], and at [employer]12:52 Magistrate Watts confirms receipt.  Findings and Custody Order has since been served, and IVC documents [facility].  Pt's nurse, Diane, has been notified, and agrees to call report to [phone].  Pt is to be transported via Patent examinerlaw enforcement.   Doylene Canninghomas Abram Sax, [place] Behavioral Health Coordinator [phone]

## 2018-01-16 NOTE — ED Notes (Signed)
Report accepted by RN Debbe BalesBrook, [facility].  Pending [employer] transport.

## 2018-01-17 DIAGNOSIS — R0602 Shortness of breath: Secondary | ICD-10-CM

## 2018-01-17 DIAGNOSIS — F209 Schizophrenia, unspecified: Principal | ICD-10-CM

## 2018-01-17 DIAGNOSIS — F17210 Nicotine dependence, cigarettes, uncomplicated: Secondary | ICD-10-CM

## 2018-01-17 DIAGNOSIS — F419 Anxiety disorder, unspecified: Secondary | ICD-10-CM

## 2018-01-17 MED ORDER — ALBUTEROL SULFATE HFA 108 (90 BASE) MCG/ACT IN AERS
2.0000 | INHALATION_SPRAY | RESPIRATORY_TRACT | Status: DC | PRN
Start: 2018-01-17 — End: 2018-01-22
  Administered 2018-01-17: 2 via RESPIRATORY_TRACT

## 2018-01-17 MED ORDER — ALBUTEROL SULFATE HFA 108 (90 BASE) MCG/ACT IN AERS
INHALATION_SPRAY | RESPIRATORY_TRACT | Status: AC
  Administered 2018-01-17: 2 via RESPIRATORY_TRACT
  Filled 2018-01-17: qty 6.7

## 2018-01-17 NOTE — BHH Counselor (Signed)
Adult Comprehensive Assessment  Patient ID: [name], male   DOB: [birth year], 27 y.o.   MRN: [id number]    Information Source: Information source: Patient  Current Stressors:  Educational / Learning stressors: N/A Employment / Job issues: Pt receives SSI   Family Relationships: N/A Surveyor, quantityinancial / Lack of resources (include bankruptcy): Pt has limited income  Housing / Lack of housing: Pt lives with his aunt  Physical health (include injuries & life threatening diseases): N/A Social relationships: Pt has few social relationships  Substance abuse: Pt denies all substance use  Bereavement / Loss: N/A   Living/Environment/Situation:  Living Arrangements: Pt lives with his aunt  Living conditions (as described by patient or guardian): "it's ok" How long has patient lived in current situation?: 2 weeks  What is atmosphere in current home: Comfortable, Supportive  Family History:  Marital status: Single Are you sexually active?: No What is your sexual orientation?: Straight  Has your sexual activity been affected by drugs, alcohol, medication, or emotional stress?: N/A Does patient have children?: No  Childhood History:  By whom was/is the patient raised?: Grandparents Description of patient's relationship with caregiver when they were a child: "I don't know" Patient's description of current relationship with people who raised him/her: "I don't know" How were you disciplined when you got in trouble as a child/adolescent?: "Man I guess punishment" Does patient have siblings?: Yes Number of Siblings: 1 Description of patient's current relationship with siblings: Does not talk to brother Did patient suffer any verbal/emotional/physical/sexual abuse as a child?: No Did patient suffer from severe childhood neglect?: No Has patient ever been sexually abused/assaulted/raped as an adolescent or adult?: No Was the patient ever a victim of a crime or a disaster?: No Witnessed domestic  violence?: No Has patient been effected by domestic violence as an adult?: No  Education:  Highest grade of school patient has completed: 9th grade Currently a student?: No Name of school: n/a  Employment/Work Situation:   Employment situation: Pt receives SSI, unknown when payments began Patient's job has been impacted by current illness: Yes 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 What is the longest time patient has a held a job?: "For a few" Where was the patient employed at that time?: [employer] Has patient ever been in the Eli Lilly and Companymilitary?: No Has patient ever served in combat?: No Did You Receive Any Psychiatric Treatment/Services While in Equities traderthe Military?: No Are There Guns or Other Weapons in Your Home?: No Are These Weapons Safely Secured?: Yes (N/A) Who Could Verify You Are Able To Have These Secured:: N/A  Financial Resources:    Alcohol/Substance Abuse:   What has been your use of drugs/alcohol within the last 12 months?: Pt denies all substance use  If attempted suicide, did drugs/alcohol play a role in this?: No Alcohol/Substance Abuse Treatment Hx: Denies past history  Social Support System:   Forensic psychologistatient's Community Support System: Poor Describe Community Support System: "I don't know"  Type of faith/religion: N/A How does patient's faith help to cope with current illness?: N/A  Leisure/Recreation:   Leisure and Hobbies: Football, watching tv  Strengths/Needs:   What things does the patient do well?: Video games  In what areas does patient struggle / problems for patient: "I want my life to be better"   Discharge Plan:   Does patient have access to transportation?: Yes Will patient be returning to same living situation after discharge?: Yes Currently receiving community mental health services: No (From Whom)  Does  patient have financial barriers related to discharge medications?: No    Summary/Recommendations:   Summary and  Recommendations (to be completed by the evaluator): [name] is a 27 year old African American male who has been diagnosed with Schizophrenia.  He presents with anxiety and paranoia.  He states that he does not know why he is at the hospital.  He has not been compliant with medications or follow up appointment.  Upon discharge he will return home with his aunt and will follow up with [facility].  While in the hospital he can benefit from crisis stabilization, medication mangement, therapeutic milieu, and a referral for services.   Aram Beecham. 01/17/2018

## 2018-01-17 NOTE — Progress Notes (Signed)
Psychoeducational Group Note  Date:  01/17/2018 Time:  2047  Group Topic/Focus:  Wrap-Up Group:   The focus of this group is to help patients review their daily goal of treatment and discuss progress on daily workbooks.  Participation Level: Did Not Attend  Participation Quality:  Not Applicable  Affect:  Not Applicable  Cognitive:  Not Applicable  Insight:  Not Applicable  Engagement in Group: Not Applicable  Additional Comments:  The patient did not attend group this evening.   Hazle CocaGOODMAN, Chudney Scheffler S 01/17/2018, 8:47 PM

## 2018-01-17 NOTE — Progress Notes (Signed)
Patient ID: Adrian GivensMarlo Warner, male   DOB: 26-Aug-1991, 26 y.o.   MRN: [id number]  D: Patient out of his room in the hallway talking to Clinical research associatewriter. Pt reports he spent most of the day in his room because of feeling tired on his new medication regime. Pt mood and affect appeared depressed and flat. Pt denies SI/HI/AVH and pain. Cooperative with assessment.  A: Medications administered as prescribed. Support and encouragement provided to attend groups and engage in milieu. Pt encouraged to discuss feelings and come to staff with any question or concerns.  R: Patient remains safe and complaint with medications.

## 2018-01-17 NOTE — Progress Notes (Signed)
Patient denies SI, HI and AVH.  Patient is paranoid and refuses to leave from his room.  Patient has poor hygiene and is malodorous. Patient has had no behavioral dyscontrol.   Assess patient for safety, offer medications as prescribed, engage patient as 1:1 staff talks.   Patient able to contract for safety, continue to monitor as planned.

## 2018-01-17 NOTE — [facility] Suicide Risk Assessment (Signed)
[facility] INPATIENT:  Family/Significant Other Suicide Prevention Education  Suicide Prevention Education:  Patient Refusal for Family/Significant Other Suicide Prevention Education: The patient [name] has refused to provide written consent for family/significant other to be provided Family/Significant Other Suicide Prevention Education during admission and/or prior to discharge.  Physician notified.  [name] 01/17/2018, 10:30 AM

## 2018-01-17 NOTE — [facility] Suicide Risk Assessment (Signed)
[facility]   Nursing information obtained from:    Demographic factors:    Current Mental Status:    Loss Factors:    Historical Factors:    Risk Reduction Factors:     Total Time spent with patient: 1 hour Principal Problem: Schizophrenia (HCC) Diagnosis:   Patient Active Problem List   Diagnosis Date Noted  . Psychosis (HCC) [F29]   . Schizophrenia (HCC) [F20.9] 05/12/2016  . Cannabis use disorder, moderate, dependence (HCC) [F12.20] 05/10/2016   Subjective Data: See H&P for details  Continued Clinical Symptoms:  Alcohol Use Disorder Identification Test Final Score (AUDIT): 0 The "Alcohol Use Disorders Identification Test", Guidelines for Use in Primary Care, Second Edition.  World Science writerHealth Organization [facility](WHO). Score between 0-7:  no or low risk or alcohol related problems. Score between 8-15:  moderate risk of alcohol related problems. Score between 16-19:  high risk of alcohol related problems. Score 20 or above:  warrants further diagnostic evaluation for alcohol dependence and treatment.   CLINICAL FACTORS:   Schizophrenia:   Paranoid or undifferentiated type Currently Psychotic Previous Psychiatric Diagnoses and Treatments  Psychiatric Specialty Exam: Physical Exam  Nursing note and vitals reviewed.   ROS- See H&P for details  Blood pressure (!) 136/110, pulse 79, temperature 98.3 F (36.8 C), temperature source Oral, resp. rate 18, height 5\' 6"  (1.676 m), weight 66.2 kg (146 lb).Body mass index is 23.57 kg/m.   COGNITIVE FEATURES THAT CONTRIBUTE TO RISK:  Closed-mindedness    SUICIDE RISK:   Minimal: No identifiable suicidal ideation.  Patients presenting with no risk factors but with morbid ruminations; may be classified as minimal risk based on the severity of the depressive symptoms  PLAN OF CARE: See H&P for details  I certify that inpatient services furnished can reasonably be expected to improve the patient's condition.    Micheal Likenshristopher T Merle Whitehorn, MD 01/17/2018, 4:26 PM

## 2018-01-17 NOTE — [facility] Group Notes (Signed)
[facility] LCSW Group Therapy 01/17/2018 1:15pm  Type of Therapy: Group Therapy- Feelings Around Discharge & Establishing a Supportive Framework  Participation Level:  Did Not Attend  Description of Group:   What is a supportive framework? What does it look like feel like and how do I discern it from and unhealthy non-supportive network? Learn how to cope when supports are not helpful and don't support you. Discuss what to do when your family/friends are not supportive.  Summary of Patient Progress   Therapeutic Modalities:   Cognitive Behavioral Therapy Person-Centered Therapy Motivational Interviewing   Carlynn Heraldngel M Mikaylah Libbey, Student-Social Work 01/17/2018 2:38 PM

## 2018-01-17 NOTE — Progress Notes (Signed)
Recreation Therapy Notes  INPATIENT RECREATION THERAPY ASSESSMENT  Patient Details Name: [name] MRN: [id number] DOB: 02/22/1991 Today's Date: 01/17/2018       Information Obtained From: Patient  Able to Participate in Assessment/Interview: Yes  Patient Presentation: Responsive  Reason for Admission (Per Patient): Patient unable to identify  Patient reports that he went to the hospital due to having chest pain. Patients states that [facility]   Patient Stressors: Relationship  Coping Skills:   Isolation, Sports, TV, Music  Leisure Interests (2+):  Individual - Phone, Games - Video games  Frequency of Recreation/Participation: Marketing executiveWeekly  Awareness of Community Resources:  No  Expressed Interest in State [employer] CorporationCommunity Resource Information: No  Patient Main Form of Transportation: Therapist, musicublic Transportation  Patient Strengths:  Football, cooking, playing games   Patient Identified Areas of Improvement:  Patient was not able to identify  Patient Goal for Hospitalization:  Patient was not able to identify   Current SI (including self-harm):  No  Current HI:  No  Current AVH: No  Staff Intervention Plan: Group Attendance, Collaborate with Interdisciplinary Treatment Team  Consent to Intern Participation: Yes  Sheryle Hailarian Roland Lipke, Recreation Therapy Intern    Sheryle HailDarian [place] Inabinet 01/17/2018, 1:45 PM

## 2018-01-17 NOTE — Progress Notes (Signed)
Recreation Therapy Notes  Date: 4.11.19 Time: 10:00 a.m.  Location: [address] Dayroom   Group Topic: Self-Awareness   Goal Area(s) Addresses:  Goal 1.1: To increase self-awareness   - Patient will identify the importance of self-awareness  - Patient will identify at least three things of how others view them  - Patient will identify at least three things of how they view themselves   Intervention: Craft   Activity: Patients were instructed to decorate the outside of their bag using the magazines provided to represent how other people see them and put things inside that represent how they really are on the inside. At the end of the session, patients shared their bags with one another.  Education: Chief Executive Officerelf-Awareness   Education Outcome:Acknowledges Education  Clinical Observations/Feedback: Patient did not attend    Sheryle HailDarian Dejanira Pamintuan, Recreation Therapy Intern   Sheryle HailDarian Vaidehi Braddy 01/17/2018 12:55 PM

## 2018-01-17 NOTE — H&P (Signed)
Psychiatric Admission Assessment Adult  Patient Identification: [name] MRN:  [id number] Date of Evaluation:  01/17/2018 Chief Complaint:  psychosis Principal Diagnosis: Schizophrenia ([place]) Diagnosis:   Patient Active Problem List   Diagnosis Date Noted  . Psychosis ([place]) [F29]   . Schizophrenia ([place]) [F20.9] 05/12/2016  . 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 He had agitation in the ED requiring PRN of geodon, ativan, and benadryl. IVC was placed in the ED, and pt was transferred to [facility] for additional treatment and evaluation.   Upon initial presentation, pt shares, "I couldn't breathe. I was wheezing, and the ambulance took me to the hospital. I'm just paranoid of people. I'm just trying to get some rest and get back to reality. I was thinking someone was trying to kill me. All that moving around makes me paranoid." Pt is generally vague, minimizing, and poor historian. He is somewhat tangential with his responses. He endorses vague derealization that he was unable to discern what is real from unreal. He denies SI/HI/AH/VH. He cites stressors of homelessness, poor social support, and unemployment. He endorses depressive symptoms of poor sleep with initial insomnia and decreased appetite, but he otherwise denies symptoms of depression. He denies symptoms of mania, OCD, and PTSD. He is vague about his substance use but he indicates he has only been using cannabis about "one blunt" per day, and UDS was only positive for THC.  Discussed with patient about treatment options. He  has not been taking any medications outside the hospital. He has relevant history of admission to [facility] in 2017 at which time he was attempted on trial of olanzapine and haldol (starting with olanzapine monotherapy and then both in combination) which were ineffective for the patient. He was transitioned to [place] oral form which had good efficacy and he was discharged on long-acting injectable form of [place]. Pt indicates he was not adherent to outpatient follow up. He is in agreement to be resumed on oral form of Invega at this time. He had no further questions, comments, or concerns.   Associated Signs/Symptoms: Depression Symptoms:  insomnia, anxiety, decreased appetite, (Hypo) Manic Symptoms:  Delusions, Anxiety Symptoms:  Excessive Worry, Psychotic Symptoms:  Delusions, Paranoia, PTSD Symptoms: NA Total Time spent with patient: 1 hour  Past Psychiatric History:  -Previous dx of schizophreniform disorder and intermittent explosive disorder - previous admission [facility] in [facility] - No current outpatient provider - denies previous hx of suicide attempt  Is the patient at risk to self? Yes.    Has the patient been a risk to self in the past 6 months? Yes.    Has the patient been a risk to self within the distant past? No.  Is the patient a risk to others? No.  Has the patient been a risk to others in the past 6 months? No.  Has the patient been a risk to others within the distant past? No.   Prior Inpatient Therapy:   Prior Outpatient Therapy:    Alcohol Screening: 1. How often do you have a drink containing alcohol?: Never 2. How many drinks containing alcohol  do you have on a typical day when you are drinking?: 1 or 2 3. How often do you have six or more drinks on one occasion?: Never AUDIT-C Score: 0 4. How often during the last year have you found that you were not able to stop drinking once you had started?: Never 5. How often during the last year have you failed to do  what was normally expected from you becasue of drinking?: Never 6. How often during the last year have you needed a first drink in the morning to get yourself going after a heavy drinking session?: Never 7. How often during the last year have you had a feeling of guilt of remorse after drinking?: Never 8. How often during the last year have you been unable to remember what happened the night before because you had been drinking?: Never 9. Have you or someone else been injured as a result of your drinking?: No 10. Has a relative or friend or a doctor or another health worker been concerned about your drinking or suggested you cut down?: No Alcohol Use Disorder Identification Test Final Score (AUDIT): 0 Intervention/Follow-up: AUDIT Score <7 follow-up not indicated Substance Abuse History in the last 12 months:  Yes.   Consequences of Substance Abuse: Medical Consequences:  worsened psychosis Previous Psychotropic Medications: Yes  Psychological Evaluations: Yes  Past Medical History:  Past Medical History:  Diagnosis Date  . Depression     Past Surgical History:  Procedure Laterality Date  . BACK SURGERY     Family History: History reviewed. No pertinent family history. Family Psychiatric  History: Denies family psychiatric history Tobacco Screening: Have you used any form of tobacco in the last 30 days? (Cigarettes, Smokeless Tobacco, Cigars, and/or Pipes): Yes Tobacco use, Select all that apply: 4 or less cigarettes per day Are you interested in Tobacco Cessation Medications?: No, patient refused Counseled patient on smoking cessation including recognizing danger situations, developing coping skills and basic information about quitting provided: Refused/Declined practical counseling Social History: Pt was born and raised in [place] and has been living in [place] area for several years. He is homeless and sometimes stays with his grandmother. He is not working. He has never been  married and has no children. He has no legal history. He denies trauma history.  Social History   Substance and Sexual Activity  Alcohol Use No     Social History   Substance and Sexual Activity  Drug Use Yes  . Types: Marijuana   Comment: rare    Additional Social History:                           Allergies:   Allergies  Allergen Reactions  . Shellfish Allergy Hives and Itching   Lab Results:  Results for orders placed or performed during the hospital encounter of 01/15/18 (from the past 48 hour(s))  Rapid urine drug screen (hospital performed)     Status: Abnormal   Collection Time: 01/15/18  4:23 PM  Result Value Ref Range   Opiates NONE DETECTED NONE DETECTED   Cocaine NONE DETECTED NONE DETECTED   Benzodiazepines NONE DETECTED NONE DETECTED   Amphetamines NONE DETECTED NONE DETECTED   Tetrahydrocannabinol POSITIVE (A) NONE DETECTED   Barbiturates NONE DETECTED NONE DETECTED    Comment: (NOTE) DRUG SCREEN FOR MEDICAL PURPOSES ONLY.  IF CONFIRMATION IS NEEDED FOR ANY PURPOSE, NOTIFY LAB WITHIN 5 DAYS. LOWEST DETECTABLE LIMITS FOR URINE DRUG SCREEN Drug Class  Cutoff (ng/mL) Amphetamine and metabolites    1000 Barbiturate and metabolites    200 Benzodiazepine                 751 Tricyclics and metabolites     300 Opiates and metabolites        300 Cocaine and metabolites        300 THC                            50 Performed at [facility], [place] [address] [place], [place] [address]   Comprehensive metabolic panel     Status: Abnormal   Collection Time: 01/15/18  4:46 PM  Result Value Ref Range   Sodium 141 135 - 145 mmol/L   Potassium 3.9 3.5 - 5.1 mmol/L   Chloride 102 101 - 111 mmol/L   CO2 29 22 - 32 mmol/L   Glucose, Bld 103 (H) 65 - 99 mg/dL   BUN 16 6 - 20 mg/dL   Creatinine, Ser 1.15 0.61 - 1.24 mg/dL   Calcium 9.7 8.9 - 10.3 mg/dL   Total Protein 7.5 6.5 - 8.1 g/dL   Albumin 4.7 3.5 - 5.0 g/dL    AST 22 15 - 41 U/L   ALT 13 (L) 17 - 63 U/L   Alkaline Phosphatase 47 38 - 126 U/L   Total Bilirubin 1.0 0.3 - 1.2 mg/dL   GFR calc non Af Amer >60 >60 mL/min   GFR calc Af Amer >60 >60 mL/min    Comment: (NOTE) The eGFR has been calculated using the CKD EPI equation. This calculation has not been validated in all clinical situations. eGFR's persistently <60 mL/min signify possible Chronic Kidney Disease.    Anion gap 10 5 - 15    Comment: Performed at [facility], [place] [address] [place], [place] [address]  Ethanol     Status: None   Collection Time: 01/15/18  4:46 PM  Result Value Ref Range   Alcohol, Ethyl (B) <10 <10 mg/dL    Comment:        LOWEST DETECTABLE LIMIT FOR SERUM ALCOHOL IS 10 mg/dL FOR MEDICAL PURPOSES ONLY Performed at [place] [address] [place], [place] [address]   Salicylate level     Status: None   Collection Time: 01/15/18  4:46 PM  Result Value Ref Range   Salicylate Lvl <6.3 2.8 - 30.0 mg/dL    Comment: Performed at [facility], [place] [address], [place], [place] [address]  Acetaminophen level     Status: Abnormal   Collection Time: 01/15/18  4:46 PM  Result Value Ref Range   Acetaminophen (Tylenol), Serum <10 (L) 10 - 30 ug/mL    Comment:        THERAPEUTIC CONCENTRATIONS VARY SIGNIFICANTLY. A RANGE OF 10-30 ug/mL MAY BE AN EFFECTIVE CONCENTRATION FOR MANY PATIENTS. HOWEVER, SOME ARE BEST TREATED AT CONCENTRATIONS OUTSIDE THIS RANGE. ACETAMINOPHEN CONCENTRATIONS >150 ug/mL AT 4 HOURS AFTER INGESTION AND >50 ug/mL AT 12 HOURS AFTER INGESTION ARE OFTEN ASSOCIATED WITH TOXIC REACTIONS. Performed at [facility], [place] [address], [place],  [address]   cbc     Status: None   Collection Time: 01/15/18  4:46 PM  Result Value Ref Range   WBC 7.0 4.0 - 10.5 K/uL   RBC 5.34 4.22 - 5.81 MIL/uL   Hemoglobin 16.6 13.0 - 17.0 g/dL   HCT  47.3 39.0 - 52.0 %    MCV 88.6 78.0 - 100.0 fL   MCH 31.1 26.0 - 34.0 pg   MCHC 35.1 30.0 - 36.0 g/dL   RDW 12.8 11.5 - 15.5 %   Platelets 235 150 - 400 K/uL    Comment: Performed at [facility], [place] [address], [place], [place] [address]    Blood Alcohol level:  Lab Results  Component Value Date   [facility] <10 01/15/2018   ETH <5 52/84/1324    Metabolic Disorder Labs:  Lab Results  Component Value Date   HGBA1C 5.2 05/12/2016   Lab Results  Component Value Date   PROLACTIN 30.5 (H) 05/12/2016   Lab Results  Component Value Date   CHOL 176 05/12/2016   TRIG 312 (H) 05/12/2016   HDL 55 05/12/2016   CHOLHDL 3.2 05/12/2016   VLDL 62 (H) 05/12/2016   LDLCALC 59 05/12/2016    Current Medications: Current Facility-Administered Medications  Medication Dose Route Frequency Provider Last Rate Last Dose  . acetaminophen (TYLENOL) tablet 650 mg  650 mg Oral Q6H PRN Ethelene Hal, NP      . alum & mag hydroxide-simeth (MAALOX/MYLANTA) 200-200-20 MG/5ML suspension 30 mL  30 mL Oral Q4H PRN Ethelene Hal, NP      . benztropine (COGENTIN) tablet 1 mg  1 mg Oral BID Ethelene Hal, NP   1 mg at 01/17/18 0958  . hydrOXYzine (ATARAX/VISTARIL) tablet 25 mg  25 mg Oral TID PRN Ethelene Hal, NP      . ziprasidone (GEODON) injection 20 mg  20 mg Intramuscular Q12H PRN Ethelene Hal, NP       And  . LORazepam (ATIVAN) tablet 1 mg  1 mg Oral PRN Ethelene Hal, NP      . magnesium hydroxide (MILK OF MAGNESIA) suspension 30 mL  30 mL Oral Daily PRN Ethelene Hal, NP      . paliperidone (INVEGA) 24 hr tablet 6 mg  6 mg Oral Daily Ethelene Hal, NP   6 mg at 01/17/18 0958  . traZODone (DESYREL) tablet 50 mg  50 mg Oral QHS PRN Ethelene Hal, NP       PTA Medications: Medications Prior to Admission  Medication Sig Dispense Refill Last Dose  . benztropine (COGENTIN) 1 MG tablet Take 1 tablet (1 mg total) by mouth 2 (two) times  daily. 60 tablet 0 unknown  . paliperidone (INVEGA SUSTENNA) 234 MG/1.5ML SUSP injection Inject 234 mg into the muscle every 30 (thirty) days. 0.9 mL 0 Past Month at Unknown time  . paliperidone (INVEGA) 6 MG 24 hr tablet Take 1 tablet (6 mg total) by mouth at bedtime. 30 tablet 0 unknown  . predniSONE (DELTASONE) 10 MG tablet Take 2 tablets (20 mg total) by mouth 2 (two) times daily. 20 tablet 0 unknown    Musculoskeletal: Strength & Muscle Tone: within normal limits Gait & Station: normal Patient leans: N/A  Psychiatric Specialty Exam: Physical Exam  Nursing note and vitals reviewed.   Review of Systems  Constitutional: Negative for chills and fever.  Respiratory: Negative for cough and shortness of breath.   Cardiovascular: Negative for chest pain.  Gastrointestinal: Negative for abdominal pain, heartburn, nausea and vomiting.  Psychiatric/Behavioral: Negative for depression, hallucinations and suicidal ideas. The patient is not nervous/anxious and does not have insomnia.     Blood pressure (!) 136/110, pulse 79, temperature 98.3 F (36.8 C), temperature source Oral, resp. rate  18, height '5\' 6"'  (1.676 m), weight 66.2 kg (146 lb).Body mass index is 23.57 kg/m.  General Appearance: Casual and Disheveled  Eye Contact:  Fair  Speech:  Clear and Coherent and Normal Rate  Volume:  Normal  Mood:  Euthymic  Affect:  Blunt and Congruent  Thought Process:  Coherent, Goal Directed and Descriptions of Associations: Loose  Orientation:  Full (Time, Place, and Person)  Thought Content:  Delusions and Paranoid Ideation  Suicidal Thoughts:  No  Homicidal Thoughts:  No  Memory:  Immediate;   Fair Recent;   Fair Remote;   Fair  Judgement:  Poor  Insight:  Lacking  Psychomotor Activity:  Normal  Concentration:  Concentration: Fair  Recall:  [employer] of Knowledge:  Fair  Language:  Fair  Akathisia:  No  Handed:    AIMS (if indicated):     Assets:  Resilience  ADL's:  Intact   Cognition:  WNL  Sleep:  Number of Hours: 5   Treatment Plan Summary: Daily contact with patient to assess and evaluate symptoms and progress in treatment and Medication management  Observation Level/Precautions:  15 minute checks  Laboratory:  CBC Chemistry Profile UDS UA  Psychotherapy:  Encourage participation in groups and therapeutic milieu   Medications:  Start Invega 67m po qDay with plan to transition to long-acting injectable form. Start cogentin 132mpo BID. Start vistaril 2543mo TID prn anxiety. Start trazodone 85m62m qhs prn insomnia. Start agitation protocol with geodon/ativan.  Consultations:  NA  Discharge Concerns:    Estimated LOS: 5-7 days  Other:     Physician Treatment Plan for Primary Diagnosis: [place]ia ([place]CC[place] Term Goal(s): Improvement in symptoms so as ready for discharge  Short Term Goals: Ability to identify and develop effective coping behaviors will improve  Physician Treatment Plan for Secondary Diagnosis: Principal Problem:   [place]ia ([place]CC[place] Term Goal(s): Improvement in symptoms so as ready for discharge  Short Term Goals: Ability to demonstrate self-control will improve  I certify that inpatient services furnished can reasonably be expected to improve the patient's condition.    ChriPennelope Bracken 4/11/20194:07 PM

## 2018-01-18 MED ORDER — PALIPERIDONE PALMITATE 156 MG/ML IM SUSP
156.0000 mg | INTRAMUSCULAR | Status: DC
  Administered 2018-01-22: 156 mg via INTRAMUSCULAR

## 2018-01-18 MED ORDER — BENZTROPINE MESYLATE 1 MG PO TABS: 1.0000 mg | ORAL_TABLET | Freq: Two times a day (BID) | ORAL | Status: DC | PRN

## 2018-01-18 MED ORDER — PALIPERIDONE PALMITATE 234 MG/1.5ML IM SUSP
234.0000 mg | Freq: Once | INTRAMUSCULAR | Status: AC
  Administered 2018-01-18: 234 mg via INTRAMUSCULAR

## 2018-01-18 NOTE — Progress Notes (Addendum)
Recreation Therapy Notes  Date: 4.12.19 Time: 10 a.m. Location: [address] Dayroom Group Topic: Stress Management, Forgiveness    Goal Area(s) Addresses:  Goal 1.1: To reduce stress  -Patient will identify how forgiveness can be beneficial  -Patient will identify the importance of stress management  -Patient will participate during Recreation Therapy group tx.    Intervention: Stress Management   Activity: Meditation- Patients were in a peaceful environment with soft lighting enhancing patients mood. Patients listened to a forgiveness meditation on the calm app to help decrease stress levels   Education: Stress Management, Discharge Planning.    Education Outcome: Acknowledges edcuation/In group clarification offered/Needs additional education   Clinical Observations/Feedback:: Patient did not attend    Sheryle HailDarian Denzil Bristol, Recreation Therapy Intern   Sheryle HailDarian Julizza Sassone 01/18/2018 11:15 AM

## 2018-01-18 NOTE — Plan of Care (Signed)
  Problem: Safety: Goal: Periods of time without injury will increase Outcome: Progressing   

## 2018-01-18 NOTE — Progress Notes (Signed)
[facility] MD Progress Note  01/18/2018 1:04 PM [name]  MRN:  [id number] Subjective:    [name] is a 27 y/o M with history of schizophreniform disorder and intermittent explosive disorder who was admitted from [facility] on IVC placed in the ED after he was brought in by emergency services whom he called with complaint of shortness of breath and wheezing, but then also reported feelings of derealization, paranoia, and SI without specific plan. He had agitation in the ED requiring PRN of geodon, ativan, and benadryl. IVC was placed in the ED, and pt was transferred to [facility] for additional treatment and evaluation. He was started on trial of Invega oral form with plan to transition to long-acting injectable Sustenna. Pt has been reporting improvement of his presenting symptoms.  Today upon evaluation, pt shares, "I'm trying to get discharged." Discussed with patient that he will need more time in the hospital so that we can safely transition to long-acting injectable form of [place], and pt verbalized good understanding. Pt denies other specific concerns today. He is sleeping well. His appetite is good. He denies physical complaints. He denies SI/HI/AH/VH. He remains mildly paranoid that he was being stalked prior to admission, and he is somewhat tangential with his responses. He denies feelings of derealization. He reports he is tolerating his medications well, and he is in agreement to start initial dose of [place] today. He had no further questions, comments, or concerns.  Principal Problem: Schizophrenia (HCC) Diagnosis:   Patient Active Problem List   Diagnosis Date Noted  . Psychosis (HCC) [F29]   . Schizophrenia (HCC) [F20.9] 05/12/2016  . Cannabis use disorder, moderate, dependence (HCC) [F12.20] 05/10/2016   Total Time spent with patient: 30 minutes  Past Psychiatric History: see H&P  Past Medical History:  Past Medical History:  Diagnosis Date  . Depression     Past  Surgical History:  Procedure Laterality Date  . BACK SURGERY     Family History: History reviewed. No pertinent family history. Family Psychiatric  History: see H&P Social History:  Social History   Substance and Sexual Activity  Alcohol Use No     Social History   Substance and Sexual Activity  Drug Use Yes  . Types: Marijuana   Comment: rare    Social History   Socioeconomic History  . Marital status: Single    Spouse name: Not on file  . Number of children: Not on file  . Years of education: Not on file  . Highest education level: Not on file  Occupational History  . Not on file  Social Needs  . Financial resource strain: Not on file  . Food insecurity:    Worry: Not on file    Inability: Not on file  . Transportation needs:    Medical: Not on file    Non-medical: Not on file  Tobacco Use  . Smoking status: Current Some Day Smoker  . Smokeless tobacco: Never Used  Substance and Sexual Activity  . Alcohol use: No  . Drug use: Yes    Types: Marijuana    Comment: rare  . Sexual activity: Yes    Birth control/protection: Condom  Lifestyle  . Physical activity:    Days per week: Not on file    Minutes per session: Not on file  . Stress: Not on file  Relationships  . Social connections:    Talks on phone: Not on file    Gets together: Not on file  Attends religious service: Not on file    Active member of club or organization: Not on file    Attends meetings of clubs or organizations: Not on file    Relationship status: Not on file  Other Topics Concern  . Not on file  Social History Narrative  . Not on file   Additional Social History:                         Sleep: Good  Appetite:  Good  Current Medications: Current Facility-Administered Medications  Medication Dose Route Frequency Provider Last Rate Last Dose  . acetaminophen (TYLENOL) tablet 650 mg  650 mg Oral Q6H PRN Laveda Abbe, NP      . albuterol (PROVENTIL  HFA;VENTOLIN HFA) 108 (90 Base) MCG/ACT inhaler 2 puff  2 puff Inhalation Q4H PRN Rankin, Shuvon B, NP   2 puff at 01/17/18 1757  . alum & mag hydroxide-simeth (MAALOX/MYLANTA) 200-200-20 MG/5ML suspension 30 mL  30 mL Oral Q4H PRN Laveda Abbe, NP      . benztropine (COGENTIN) tablet 1 mg  1 mg Oral BID Laveda Abbe, NP   1 mg at 01/18/18 9604  . hydrOXYzine (ATARAX/VISTARIL) tablet 25 mg  25 mg Oral TID PRN Laveda Abbe, NP   25 mg at 01/17/18 2132  . ziprasidone (GEODON) injection 20 mg  20 mg Intramuscular Q12H PRN Laveda Abbe, NP       And  . LORazepam (ATIVAN) tablet 1 mg  1 mg Oral PRN Laveda Abbe, NP      . magnesium hydroxide (MILK OF MAGNESIA) suspension 30 mL  30 mL Oral Daily PRN Laveda Abbe, NP      . paliperidone (INVEGA SUSTENNA) injection 234 mg  234 mg Intramuscular Once Micheal Likens, MD       Followed by  . [START ON 01/22/2018] paliperidone (INVEGA SUSTENNA) injection 156 mg  156 mg Intramuscular Q30 days Jolyne Loa T, MD      . paliperidone (INVEGA) 24 hr tablet 6 mg  6 mg Oral Daily Laveda Abbe, NP   6 mg at 01/18/18 5409  . traZODone (DESYREL) tablet 50 mg  50 mg Oral QHS PRN Laveda Abbe, NP   50 mg at 01/17/18 2132    Lab Results: No results found for this or any previous visit (from the past 48 hour(s)).  Blood Alcohol level:  Lab Results  Component Value Date   ETH <10 01/15/2018   ETH <5 05/25/2017    Metabolic Disorder Labs: Lab Results  Component Value Date   HGBA1C 5.2 05/12/2016   Lab Results  Component Value Date   PROLACTIN 30.5 (H) 05/12/2016   Lab Results  Component Value Date   CHOL 176 05/12/2016   TRIG 312 (H) 05/12/2016   HDL 55 05/12/2016   CHOLHDL 3.2 05/12/2016   VLDL 62 (H) 05/12/2016   LDLCALC 59 05/12/2016    Physical Findings: AIMS: Facial and Oral Movements Muscles of Facial Expression: None, normal Lips and Perioral Area:  None, normal Jaw: None, normal Tongue: None, normal,Extremity Movements Upper (arms, wrists, hands, fingers): None, normal Lower (legs, knees, ankles, toes): None, normal, Trunk Movements Neck, shoulders, hips: None, normal, Overall Severity Severity of abnormal movements (highest score from questions above): None, normal Incapacitation due to abnormal movements: None, normal Patient's awareness of abnormal movements (rate only patient's report): No Awareness, Dental Status Current problems with teeth and/or dentures?:  No Does patient usually wear dentures?: No  CIWA:    COWS:     Musculoskeletal: Strength & Muscle Tone: within normal limits Gait & Station: normal Patient leans: N/A  Psychiatric Specialty Exam: Physical Exam  Nursing note and vitals reviewed.   Review of Systems  Constitutional: Negative for chills and fever.  Respiratory: Negative for cough and shortness of breath.   Cardiovascular: Negative for chest pain.  Gastrointestinal: Negative for abdominal pain, heartburn, nausea and vomiting.  Psychiatric/Behavioral: Negative for depression, hallucinations and suicidal ideas. The patient is not nervous/anxious and does not have insomnia.     Blood pressure (!) 136/110, pulse 79, temperature 98.3 F (36.8 C), temperature source Oral, resp. rate 18, height 5\' 6"  (1.676 m), weight 66.2 kg (146 lb).Body mass index is 23.57 kg/m.  General Appearance: Casual and Fairly Groomed  Eye Contact:  Good  Speech:  Clear and Coherent and Normal Rate  Volume:  Normal  Mood:  Euthymic  Affect:  Appropriate, Congruent and Flat  Thought Process:  Coherent and Goal Directed  Orientation:  Full (Time, Place, and Person)  Thought Content:  Paranoid Ideation and Tangential  Suicidal Thoughts:  No  Homicidal Thoughts:  No  Memory:  Immediate;   Fair Recent;   Fair Remote;   Fair  Judgement:  Impaired  Insight:  Lacking  Psychomotor Activity:  Normal  Concentration:   Concentration: Fair  Recall:  [employer]Fair  Fund of Knowledge:  Fair  Language:  Fair  Akathisia:  No  Handed:    AIMS (if indicated):     Assets:  Resilience Social Support  ADL's:  Intact  Cognition:  WNL  Sleep:  Number of Hours: 6.5   Treatment Plan Summary: Daily contact with patient to assess and evaluate symptoms and progress in treatment and Medication management   -Continue inpatient hospitalization  -Schizophrenia   - Continue Invega 6mg  po qDay   - Start Hinda GlatterInvega Sustenna 234mg  IM once (administer today 01/18/18) followed by Gean BirchwoodInvega Sustenna 156mg  IM q30 days starting in 7 days +/- 4 days  -EPS   - Continue cogentin 1mg  po BID  -Anxiety   - Continue vistaril 25mg  po TID prn anxiety  -Insomnia   - Continue trazodone 50mg  po qhs prn insomnia  - Agitation   -Continue agitation protocol with geodon/ativan  -Encourage participation in groups and therapeutic milieu  -Disposition planning will be ongoing  Micheal Likenshristopher T Kahner Yanik, MD 01/18/2018, 1:04 PM

## 2018-01-18 NOTE — Tx Team (Signed)
Interdisciplinary Treatment and Diagnostic Plan Update  01/18/2018 Time of Session: 9:15 AM  [name] MRN: [id number]  Principal Diagnosis: Schizophrenia (HCC)  Secondary Diagnoses: Principal Problem:   Schizophrenia (HCC)   Current Medications:  Current Facility-Administered Medications  Medication Dose Route Frequency Provider Last Rate Last Dose  . acetaminophen (TYLENOL) tablet 650 mg  650 mg Oral Q6H PRN Laveda AbbeParks, Laurie Britton, NP      . albuterol (PROVENTIL HFA;VENTOLIN HFA) 108 (90 Base) MCG/ACT inhaler 2 puff  2 puff Inhalation Q4H PRN Rankin, Shuvon B, NP   2 puff at 01/17/18 1757  . alum & mag hydroxide-simeth (MAALOX/MYLANTA) 200-200-20 MG/5ML suspension 30 mL  30 mL Oral Q4H PRN Laveda AbbeParks, Laurie Britton, NP      . benztropine (COGENTIN) tablet 1 mg  1 mg Oral BID Laveda AbbeParks, Laurie Britton, NP   1 mg at 01/18/18 [id number]  . hydrOXYzine (ATARAX/VISTARIL) tablet 25 mg  25 mg Oral TID PRN Laveda AbbeParks, Laurie Britton, NP   25 mg at 01/17/18 2132  . ziprasidone (GEODON) injection 20 mg  20 mg Intramuscular Q12H PRN Laveda AbbeParks, Laurie Britton, NP       And  . LORazepam (ATIVAN) tablet 1 mg  1 mg Oral PRN Laveda AbbeParks, Laurie Britton, NP      . magnesium hydroxide (MILK OF MAGNESIA) suspension 30 mL  30 mL Oral Daily PRN Laveda AbbeParks, Laurie Britton, NP      . paliperidone (INVEGA) 24 hr tablet 6 mg  6 mg Oral Daily Laveda AbbeParks, Laurie Britton, NP   6 mg at 01/18/18 [id number]  . traZODone (DESYREL) tablet 50 mg  50 mg Oral QHS PRN Laveda AbbeParks, Laurie Britton, NP   50 mg at 01/17/18 2132    PTA Medications: Medications Prior to Admission  Medication Sig Dispense Refill Last Dose  . benztropine (COGENTIN) 1 MG tablet Take 1 tablet (1 mg total) by mouth 2 (two) times daily. 60 tablet 0 unknown  . paliperidone (INVEGA SUSTENNA) 234 MG/1.5ML SUSP injection Inject 234 mg into the muscle every 30 (thirty) days. 0.9 mL 0 Past Month at Unknown time  . paliperidone (INVEGA) 6 MG 24 hr tablet Take 1 tablet (6 mg total) by mouth at bedtime.  30 tablet 0 unknown  . predniSONE (DELTASONE) 10 MG tablet Take 2 tablets (20 mg total) by mouth 2 (two) times daily. 20 tablet 0 unknown    Treatment Modalities: Medication Management, Group therapy, Case management,  1 to 1 session with clinician, Psychoeducation, Recreational therapy.  Patient Stressors: Medication change or noncompliance  Patient Strengths: Average or above average intelligence Capable of independent living General fund of knowledge   Physician Treatment Plan for Primary Diagnosis: Schizophrenia (HCC) Long Term Goal(s): Improvement in symptoms so as ready for discharge  Short Term Goals: Ability to identify and develop effective coping behaviors will improve Ability to demonstrate self-control will improve  Medication Management: Evaluate patient's response, side effects, and tolerance of medication regimen.  Therapeutic Interventions: 1 to 1 sessions, Unit Group sessions and Medication administration.  Evaluation of Outcomes: Progressing  Physician Treatment Plan for Secondary Diagnosis: Principal Problem:   Schizophrenia (HCC)  Long Term Goal(s): Improvement in symptoms so as ready for discharge  Short Term Goals: Ability to identify and develop effective coping behaviors will improve Ability to demonstrate self-control will improve  Medication Management: Evaluate patient's response, side effects, and tolerance of medication regimen.  Therapeutic Interventions: 1 to 1 sessions, Unit Group sessions and Medication administration.  Evaluation of Outcomes: Progressing   RN Treatment  Plan for Primary Diagnosis: Schizophrenia (HCC) Long Term Goal(s): Knowledge of disease and therapeutic regimen to maintain health will improve  Short Term Goals: Ability to demonstrate self-control, Ability to participate in decision making will improve, Ability to identify and develop effective coping behaviors will improve and Compliance with prescribed medications will  improve  Medication Management: RN will administer medications as ordered by provider, will assess and evaluate patient's response and provide education to patient for prescribed medication. RN will report any adverse and/or side effects to prescribing provider.  Therapeutic Interventions: 1 on 1 counseling sessions, Psychoeducation, Medication administration, Evaluate responses to treatment, Monitor vital signs and CBGs as ordered, Perform/monitor CIWA, COWS, AIMS and Fall Risk screenings as ordered, Perform wound care treatments as ordered.  Evaluation of Outcomes: Progressing   LCSW Treatment Plan for Primary Diagnosis: Schizophrenia (HCC) Long Term Goal(s): Safe transition to appropriate next level of care at discharge, Engage patient in therapeutic group addressing interpersonal concerns.  Short Term Goals: Engage patient in aftercare planning with referrals and resources, Increase social support, Facilitate acceptance of mental health diagnosis and concerns, Identify triggers associated with mental health/substance abuse issues and Increase skills for wellness and recovery  Therapeutic Interventions: Assess for all discharge needs, 1 to 1 time with Social worker, Explore available resources and support systems, Assess for adequacy in community support network, Educate family and significant other(s) on suicide prevention, Complete Psychosocial Assessment, Interpersonal group therapy.  Evaluation of Outcomes: Progressing   Progress in Treatment: Attending groups: Yes Participating in groups: Yes Taking medication as prescribed: Yes Toleration of medication: Yes, no side effects reported at this time Family/Significant other contact made: No Patient understands diagnosis: No, limited insight  Discussing patient identified problems/goals with staff: Yes Medical problems stabilized or resolved: Yes Denies suicidal/homicidal ideation: Yes Issues/concerns per patient self-inventory:  None Other: N/A  New problem(s) identified: None identified at this time.   New Short Term/Long Term Goal(s): "Getting out of the hospital and maintaining stability".    Discharge Plan or Barriers: Upon discharge pt will return home with his aunt and will [facility] up at [facility].  Reason for Continuation of Hospitalization: Hallucinations Medication stabilization  Estimated Length of Stay: 01/22/18  Attendees: Patient: [name]  01/18/2018  9:15 AM  Physician: Jolyne Loa, MD 01/18/2018  9:15 AM  Nursing: Estella Husk, RN 01/18/2018  9:15 AM  RN Care Manager: Onnie Boer, RN 01/18/2018  9:15 AM  Social Worker: Richelle Ito, LCSW; Melba Coon, Social Work Intern 01/18/2018  9:15 AM  Recreational Therapist: Caroll Rancher, LRT 01/18/2018  9:15 AM  Other: Tomasita Morrow, P4CC 01/18/2018  9:15 AM  Other:  01/18/2018  9:15 AM  Other: 01/18/2018  9:15 AM    Scribe for Treatment Team: Aram Beecham, Student-Social Work 01/18/2018 9:15 AM

## 2018-01-18 NOTE — Progress Notes (Signed)
Patient denies SI, HI and AVH.  Patient is paranoid and refuses to leave from his room.  Patient has poor hygiene and is malodorous. Patient has had no behavioral dyscontrol.   Assess patient for safety, offer medications as prescribed, engage patient as 1:1 staff talks.   Patient able to contract for safety, continue to monitor as planned.  

## 2018-01-18 NOTE — Progress Notes (Signed)
D: Pt denies SI/HI/AVH. Pt is pleasant and cooperative. Pt presents very paranoid. Pt spends little time on the milieu. Pt appears to be responding at times.  A: Pt was offered support and encouragement. Pt was given scheduled medications. Pt was encourage to attend groups. Q 15 minute checks were done for safety.   R:Pt attends groups and interacts well with peers and staff. Pt is taking medication. Pt  safety maintained on unit.

## 2018-01-18 NOTE — [facility] Group Notes (Signed)
LCSW Group Therapy Note   01/18/2018 1:15pm   Type of Therapy and Topic:  Group Therapy:  Positive Affirmations   Participation Level:  Did Not Attend  Description of Group: This group addressed positive affirmation toward self and others. Patients went around the room and identified two positive things about themselves and two positive things about a peer in the room. Patients reflected on how it felt to share something positive with others, to identify positive things about themselves, and to hear positive things from others. Patients were encouraged to have a daily reflection of positive characteristics or circumstances.  Therapeutic Goals 1. Patient will verbalize two of their positive qualities 2. Patient will demonstrate empathy for others by stating two positive qualities about a peer in the group 3. Patient will verbalize their feelings when voicing positive self affirmations and when voicing positive affirmations of others 4. Patients will discuss the potential positive impact on their wellness/recovery of focusing on positive traits of self and others. Summary of Patient Progress:    Therapeutic Modalities Cognitive Behavioral Therapy Motivational Interviewing  Carlynn Heraldngel M [name] , Student-Social Work 01/18/2018 1:02 PM

## 2018-01-19 DIAGNOSIS — J45909 Unspecified asthma, uncomplicated: Secondary | ICD-10-CM

## 2018-01-19 DIAGNOSIS — G4700 Insomnia, unspecified: Secondary | ICD-10-CM

## 2018-01-19 DIAGNOSIS — F200 Paranoid schizophrenia: Secondary | ICD-10-CM

## 2018-01-19 DIAGNOSIS — F1290 Cannabis use, unspecified, uncomplicated: Secondary | ICD-10-CM

## 2018-01-19 MED ORDER — LORAZEPAM 1 MG PO TABS
1.0000 mg | ORAL_TABLET | ORAL | Status: AC | PRN
  Administered 2018-01-19: 1 mg via ORAL
  Filled 2018-01-19: qty 1

## 2018-01-19 MED ORDER — HYDROXYZINE HCL 25 MG PO TABS
25.0000 mg | ORAL_TABLET | Freq: Three times a day (TID) | ORAL | Status: DC | PRN
  Administered 2018-01-19 – 2018-01-21 (×5): 25 mg via ORAL
  Filled 2018-01-19 (×5): qty 1

## 2018-01-19 MED ORDER — ZIPRASIDONE MESYLATE 20 MG IM SOLR: 20.0000 mg | Freq: Two times a day (BID) | INTRAMUSCULAR | Status: DC | PRN

## 2018-01-19 MED ORDER — TRAZODONE HCL 100 MG PO TABS
100.0000 mg | ORAL_TABLET | Freq: Every evening | ORAL | Status: DC | PRN
  Administered 2018-01-19 – 2018-01-21 (×3): 100 mg via ORAL
  Filled 2018-01-19 (×2): qty 1

## 2018-01-19 NOTE — Progress Notes (Signed)
[facility] MD Progress Note  01/19/2018 2:03 PM Ronnell Clinger  MRN:  [id number]  Subjective: Haig reports, "I'm not happy being in this place. I'm ready to go home. I'm upset for being in this place for no reason. I do not go to groups. I don't like being around people. Why are you guys giving medicines?  I don't want to be on any medicines. I don't like medicines. I did not sleep well last night because this is not my home. It has nothing to do with taking medicines or not. It is because, I don't sleep well outside my home".  Sabre Leonetti is a 27 y/o M with history of schizophreniform disorder and intermittent explosive disorder who was admitted from [facility] on IVC placed in the ED after he was brought in by emergency services whom he called with complaint of shortness of breath and wheezing, but then also reported feelings of derealization, paranoia, and SI without specific plan. He had agitation in the ED requiring PRN of geodon, ativan, and benadryl. IVC was placed in the ED, and pt was transferred to [facility] for additional treatment and evaluation  Today, [name] is seen, chart reviewed. The chart findings discussed with the treatment team. He is lying in his bed with the door closed. He is easily aroused. He is verbally responsive, making a fair eye contact. He says he does not need to take medicines because he does not like medications. The RN reports patient did take his medicines today, but reported having difficulty swallowing the medicines this am. However, when offered Gatorade to swallow the medicines with, patient did not present any more issues swallowing the medicines. Traevon has no problems eating & swallowing his meals. He is not very visible on the unit expect when taking medicines. He is not attending group sessions. He says the reason is because he does not like to be around people. He adds that he is upset for being here & that he is ready to go home. He presents with a restricted affect. Although, he  says he doesn't want medicines, the RN reports that patient is cooperative in taking his medicines. No adverse effects reported. He does not appear to be responding to any internal stimuli. He denies any SIHI, AVH. Staff continue to provide support.  Principal Problem: Schizophrenia (HCC)  Diagnosis:   Patient Active Problem List   Diagnosis Date Noted  . Psychosis (HCC) [F29]   . Schizophrenia (HCC) [F20.9] 05/12/2016  . Cannabis use disorder, moderate, dependence (HCC) [F12.20] 05/10/2016   Total Time spent with patient: 25 minutes  Past Psychiatric History: See H&P  Past Medical History:  Past Medical History:  Diagnosis Date  . Depression     Past Surgical History:  Procedure Laterality Date  . BACK SURGERY     Family History: History reviewed. No pertinent family history.  Family Psychiatric  History: See H&P.  Social History:  Social History   Substance and Sexual Activity  Alcohol Use No     Social History   Substance and Sexual Activity  Drug Use Yes  . Types: Marijuana   Comment: rare    Social History   Socioeconomic History  . Marital status: Single    Spouse name: Not on file  . Number of children: Not on file  . Years of education: Not on file  . Highest education level: Not on file  Occupational History  . Not on file  Social Needs  . Financial resource strain: Not on file  .  Food insecurity:    Worry: Not on file    Inability: Not on file  . Transportation needs:    Medical: Not on file    Non-medical: Not on file  Tobacco Use  . Smoking status: Current Some Day Smoker  . Smokeless tobacco: Never Used  Substance and Sexual Activity  . Alcohol use: No  . Drug use: Yes    Types: Marijuana    Comment: rare  . Sexual activity: Yes    Birth control/protection: Condom  Lifestyle  . Physical activity:    Days per week: Not on file    Minutes per session: Not on file  . Stress: Not on file  Relationships  . Social connections:     Talks on phone: Not on file    Gets together: Not on file    Attends religious service: Not on file    Active member of club or organization: Not on file    Attends meetings of clubs or organizations: Not on file    Relationship status: Not on file  Other Topics Concern  . Not on file  Social History Narrative  . Not on file   Additional Social History:   Sleep: Fair  Appetite:  Good  Current Medications: Current Facility-Administered Medications  Medication Dose Route Frequency Provider Last Rate Last Dose  . acetaminophen (TYLENOL) tablet 650 mg  650 mg Oral Q6H PRN Laveda Abbe, NP      . albuterol (PROVENTIL HFA;VENTOLIN HFA) 108 (90 Base) MCG/ACT inhaler 2 puff  2 puff Inhalation Q4H PRN Rankin, Shuvon B, NP   2 puff at 01/17/18 1757  . alum & mag hydroxide-simeth (MAALOX/MYLANTA) 200-200-20 MG/5ML suspension 30 mL  30 mL Oral Q4H PRN Laveda Abbe, NP      . benztropine (COGENTIN) tablet 1 mg  1 mg Oral BID PRN Micheal Likens, MD      . hydrOXYzine (ATARAX/VISTARIL) tablet 25 mg  25 mg Oral TID PRN Micheal Likens, MD      . ziprasidone (GEODON) injection 20 mg  20 mg Intramuscular Q12H PRN Micheal Likens, MD       And  . LORazepam (ATIVAN) tablet 1 mg  1 mg Oral PRN Micheal Likens, MD      . magnesium hydroxide (MILK OF MAGNESIA) suspension 30 mL  30 mL Oral Daily PRN Laveda Abbe, NP      . Melene Muller ON 01/22/2018] paliperidone (INVEGA SUSTENNA) injection 156 mg  156 mg Intramuscular Q30 days Jolyne Loa T, MD      . paliperidone (INVEGA) 24 hr tablet 6 mg  6 mg Oral Daily Laveda Abbe, NP   6 mg at 01/19/18 0857  . traZODone (DESYREL) tablet 50 mg  50 mg Oral QHS PRN Laveda Abbe, NP   50 mg at 01/18/18 2110    Lab Results: No results found for this or any previous visit (from the past 48 hour(s)).  Blood Alcohol level:  Lab Results  Component Value Date   ETH <10 01/15/2018    ETH <5 05/25/2017   Metabolic Disorder Labs: Lab Results  Component Value Date   HGBA1C 5.2 05/12/2016   Lab Results  Component Value Date   PROLACTIN 30.5 (H) 05/12/2016   Lab Results  Component Value Date   CHOL 176 05/12/2016   TRIG 312 (H) 05/12/2016   HDL 55 05/12/2016   CHOLHDL 3.2 05/12/2016   VLDL 62 (H) 05/12/2016   LDLCALC 59  05/12/2016   Physical Findings: AIMS: Facial and Oral Movements Muscles of Facial Expression: None, normal Lips and Perioral Area: None, normal Jaw: None, normal Tongue: None, normal,Extremity Movements Upper (arms, wrists, hands, fingers): None, normal Lower (legs, knees, ankles, toes): None, normal, Trunk Movements Neck, shoulders, hips: None, normal, Overall Severity Severity of abnormal movements (highest score from questions above): None, normal Incapacitation due to abnormal movements: None, normal Patient's awareness of abnormal movements (rate only patient's report): No Awareness, Dental Status Current problems with teeth and/or dentures?: No Does patient usually wear dentures?: No  CIWA:    COWS:     Musculoskeletal: Strength & Muscle Tone: within normal limits Gait & Station: normal Patient leans: N/A  Psychiatric Specialty Exam: Physical Exam  Nursing note and vitals reviewed.   Review of Systems  Psychiatric/Behavioral: Positive for hallucinations and substance abuse (Hx. THC use). Negative for depression (Denies symptoms of depression), memory loss and suicidal ideas. The patient is not nervous/anxious and does not have insomnia.     Blood pressure (!) 133/94, pulse 90, temperature (!) 97.2 F (36.2 C), temperature source Oral, resp. rate 18, height 5\' 6"  (1.676 m), weight 66.2 kg (146 lb).Body mass index is 23.57 kg/m.  General Appearance: Casual and Disheveled  Eye Contact:  Fair  Speech:  Clear and Coherent and Normal Rate  Volume:  Normal  Mood:  Euthymic  Affect:  Blunt and Congruent  Thought Process:   Coherent, Goal Directed and Descriptions of Associations: Loose  Orientation:  Full (Time, Place, and Person)  Thought Content:  Delusions and Paranoid Ideation  Suicidal Thoughts:  No  Homicidal Thoughts:  No  Memory:  Immediate;   Fair Recent;   Fair Remote;   Fair  Judgement:  Poor  Insight:  Lacking  Psychomotor Activity:  Normal  Concentration:  Concentration: Fair  Recall:  [employer]Fair  Fund of Knowledge:  Fair  Language:  Fair  Akathisia:  No  Handed:    AIMS (if indicated):     Assets:  Resilience  ADL's:  Intact  Cognition:  WNL  Sleep: 5.25      Treatment Plan Summary: Daily contact with patient to assess and evaluate symptoms and progress in treatment:  - Continue inpatient hospitalization.  - Will continue today 01/19/2018 plan as below except where it is noted.  Mood control.    - Continue paliperidone injectable Hinda Glatter(Invega Sustenna) 156 mg/ml IM due to be given on 01-22-18.    - Continue paliperidone 6 mg po daily.   EPS.    - Continue Cogentin 1 mg po prn.  Anxiety.    - Continue Hydroxyzine 25 mg po prn Q 6 hours.  Asthma/SOB.    - Continue Albuterol inhaler 108  (90 base) 2 puffs Q 4 hours prn.  Insomnia.    - Continue Trazodone 50 po Q hs prn.  Agitation/psychosis.    - Continue the agitation protocols using Geodon 20 mg IM Q 12 hrs prn & Lorazepam 1 mg po Q 12 hours prn.     - Staff to continue to encourage patient to attend group sessions.    - Discharge disposition ongoing.  Armandina StammerAgnes Lechelle Wrigley, NP, pmhnp, fnp-bc. 01/19/2018, 2:03 PM

## 2018-01-19 NOTE — Progress Notes (Signed)
Patient ID: Adrian GivensMarlo Warner, male   DOB: 1991-07-26, 26 y.o.   MRN: [id number]     D: Pt has been very labile on the unit today. At times patient reported being very depressed, other times he was at the nursing station laughing and rolling in the floor. Pt reported that he was just ready to go home, pt was given medication to help with anxiety. Pt refused to then come out of his room, reporting that he did not want to be around people. Pt refused to fill out his patient self inventory sheet. Pt reported being negative SI/HI, no AH/VH noted. A: 15 min checks continued for patient safety. R: Pt safety maintained.

## 2018-01-19 NOTE — [facility] Group Notes (Signed)
[facility] Group Notes: (Clinical Social Work)   01/19/2018      Type of Therapy:  Group Therapy   Participation Level:  Did Not Attend despite MHT prompting   Adrian MantleMareida Grossman-Orr, LCSW 01/19/2018, 12:10 PM

## 2018-01-19 NOTE — [facility] Group Notes (Signed)
[facility] Group Notes:  (Nursing/MHT/Case Management/Adjunct)  Date:  01/19/2018  Time:  12:20 PM  Type of Therapy:  Psychoeducational Skills  Participation Level:  Did Not Attend  Participation Quality:  Did not attend  Affect:  Did not attend  Cognitive:  Did not attend  Insight: Did not attend   Engagement in Group:  Did not attend  Modes of Intervention:  Did not attend  Summary of Progress/Problems: Did not attend Orientation/ Goals group  Janne LabShaneka E Modest Draeger 01/19/2018, 12:20 PM

## 2018-01-19 NOTE — [facility] Group Notes (Signed)
[facility] Group Notes:  (Nursing/MHT/Case Management/Adjunct)  Date:  01/19/2018  Time:  12:37 PM  Type of Therapy:  Psychoeducational Skills  Participation Level:  Did Not Attend  Participation Quality:  Did not attend  Affect:  Did not attend  Cognitive:  Did not attend  Insight: Did not attend   Engagement in Group:  Did not attend  Modes of Intervention:  Did not attend  Summary of Progress/Problems: Pt did not attend Psychoeducational skills group with topic Anger management  Dorota Heinrichs E Avir Deruiter 01/19/2018, 12:37 PM 

## 2018-01-20 NOTE — Plan of Care (Signed)
Problem: Health Behavior/Discharge Planning: Goal: Compliance with treatment plan for underlying cause of condition will improve Intervention: Patient encouraged to take medications as prescribed and attend groups. Patient encouraged to be active in their recovery. Outcome: Patient is taking medications as prescribed. Patient continues to need encouragement to attend groups. 01/20/2018 10:17 AM - Progressing by Ferrel Loganollazo, Elward Nocera A, RN    Problem: Safety: Goal: Periods of time without injury will increase Intervention: Patient contracts for safety on the unit. Low fall risk precautions in place. Safety monitored with q15 minute checks. Outcome: Patient remains safe on the unit at this time. 01/20/2018 10:17 AM - Progressing by Ferrel Loganollazo, Adin Lariccia A, RN

## 2018-01-20 NOTE — [facility] Group Notes (Signed)
[facility] Group Notes:  (Nursing/MHT/Case Management/Adjunct)  Date:  01/20/2018  Time:  10:22 AM  Type of Therapy:  Psychoeducational Skills  Participation Level:  Did Not Attend  Participation Quality:  Did not attend  Affect:  Did not attend  Cognitive:  Did not attend  Insight:  None  Engagement in Group:  Did not attend  Modes of Intervention:  Did not attend  Summary of Progress/Problems: Pt did not attend Psychoeducational group with topic healthy support systems.  Jacquelyne BalintForrest, Sai Moura Shanta 01/20/2018, 10:22 AM

## 2018-01-20 NOTE — Progress Notes (Signed)
D: Pt denies SI/HI/AVH. Pt is pleasant and cooperative. Pt continues to be paranoid and suspicious. Pt stated it was because he was ready to go home . Pt visibly disturbed / anxious, pt had to be verbally de escalated. Pt continues to keep to himself due to not being able to tolerate being around other people.   A: Pt was offered support and encouragement. Pt was given scheduled medications. Pt was encourage to attend groups. Q 15 minute checks were done for safety.   R: safety maintained on unit.

## 2018-01-20 NOTE — Plan of Care (Signed)
  Problem: Coping: Goal: Ability to demonstrate self-control will improve Outcome: Progressing   Problem: Safety: Goal: Periods of time without injury will increase Outcome: Progressing   Problem: Coping: Goal: Ability to verbalize frustrations and anger appropriately will improve Outcome: Progressing

## 2018-01-20 NOTE — Progress Notes (Signed)
[facility] MD Progress Note  01/20/2018 1:00 PM [name]  MRN:  [id number]  Subjective: [name] reports, "I'm ready to go home. I do not go to groups. I don't like being around people. 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 He had agitation in the ED requiring PRN of geodon, ativan, and benadryl. IVC was placed in the ED, and pt was transferred to [facility] for additional treatment and evaluation  Today, [name] is seen, chart reviewed. The chart findings discussed with the treatment team. He is lying in his bed with the door closed. He is easily aroused. He is verbally responsive, making a fair eye contact. He says yesterday that he does not need to take medicines because he does not like medications. The RN reports patient did take his medicines medications as recommended. No reports about having difficulty swallowing the medicines today. [name] has no problems eating & swallowing his meals. He is not very visible on the unit expect when taking medicines. He is not attending group sessions. He says the reason is because he does not like to be around people. He adds that he is upset for being here & that he is ready to go home. He presents with a restricted affect. Although, he says he doesn't want medicines, the RN reports that patient is cooperative in taking his medicines. No adverse effects reported. He does not appear to be responding to any internal stimuli. He denies any SIHI, AVH. Staff continue to provide support. Will continue with the current plan of care as already in progress. No changes made. His [name] is due to be administered IM tomorrow  01-22-18.  Principal Problem: Schizophrenia (HCC)  Diagnosis:   Patient Active Problem List   Diagnosis Date Noted  . Psychosis (HCC) [F29]   . Schizophrenia (HCC) [F20.9] 05/12/2016  . Cannabis use disorder, moderate, dependence (HCC) [F12.20] 05/10/2016   Total Time spent with patient: 25 minutes  Past Psychiatric History: See H&P  Past Medical History:  Past Medical History:  Diagnosis Date  . Depression     Past Surgical History:  Procedure Laterality Date  . BACK SURGERY     Family History: History reviewed. No pertinent family history.  Family Psychiatric  History: See H&P.  Social History:  Social History   Substance and Sexual Activity  Alcohol Use No     Social History   Substance and Sexual Activity  Drug Use Yes  . Types: Marijuana   Comment: rare    Social History   Socioeconomic History  . Marital status: Single    Spouse name: Not on file  . Number of children: Not on file  . Years of education: Not on file  . Highest education level: Not on file  Occupational History  . Not on file  Social Needs  . Financial resource strain: Not on file  . Food insecurity:    Worry: Not on file    Inability: Not on file  . Transportation needs:    Medical: Not on file    Non-medical: Not on file  Tobacco Use  . Smoking status: Current Some Day Smoker  . Smokeless  tobacco: Never Used  Substance and Sexual Activity  . Alcohol use: No  . Drug use: Yes    Types: Marijuana    Comment: rare  . Sexual activity: Yes    Birth control/protection: Condom  Lifestyle  . Physical activity:    Days per week: Not on file    Minutes per session: Not on file  . Stress: Not on file  Relationships  . Social connections:    Talks on phone: Not on file    Gets together: Not on file    Attends religious service: Not on file    Active member of club or organization: Not on file    Attends meetings of clubs or organizations: Not on file    Relationship status:  Not on file  Other Topics Concern  . Not on file  Social History Narrative  . Not on file   Additional Social History:   Sleep: Fair  Appetite:  Good  Current Medications: Current Facility-Administered Medications  Medication Dose Route Frequency Provider Last Rate Last Dose  . acetaminophen (TYLENOL) tablet 650 mg  650 mg Oral Q6H PRN Laveda AbbeParks, Laurie Britton, NP      . albuterol (PROVENTIL HFA;VENTOLIN HFA) 108 (90 Base) MCG/ACT inhaler 2 puff  2 puff Inhalation Q4H PRN Rankin, Shuvon B, NP   2 puff at 01/17/18 1757  . alum & mag hydroxide-simeth (MAALOX/MYLANTA) 200-200-20 MG/5ML suspension 30 mL  30 mL Oral Q4H PRN Laveda AbbeParks, Laurie Britton, NP      . benztropine (COGENTIN) tablet 1 mg  1 mg Oral BID PRN Micheal Likensainville, Christopher T, MD      . hydrOXYzine (ATARAX/VISTARIL) tablet 25 mg  25 mg Oral TID PRN Micheal Likensainville, Christopher T, MD   25 mg at 01/19/18 2122  . magnesium hydroxide (MILK OF MAGNESIA) suspension 30 mL  30 mL Oral Daily PRN Laveda AbbeParks, Laurie Britton, NP      . Melene Muller[START ON 01/22/2018] paliperidone (INVEGA Warner) injection 156 mg  156 mg Intramuscular Q30 days Jolyne Loaainville, Christopher T, MD      . paliperidone (INVEGA) 24 hr tablet 6 mg  6 mg Oral Daily Laveda AbbeParks, Laurie Britton, NP   6 mg at 01/20/18 [id number]  . traZODone (DESYREL) tablet 100 mg  100 mg Oral QHS PRN Nira ConnBerry, Jason A, NP   100 mg at 01/19/18 2122  . ziprasidone (GEODON) injection 20 mg  20 mg Intramuscular Q12H PRN Micheal Likensainville, Christopher T, MD        Lab Results: No results found for this or any previous visit (from the past 48 hour(s)).  Blood Alcohol level:  Lab Results  Component Value Date   ETH <10 01/15/2018   ETH <5 05/25/2017   Metabolic Disorder Labs: Lab Results  Component Value Date   HGBA1C 5.2 05/12/2016   Lab Results  Component Value Date   PROLACTIN 30.5 (H) 05/12/2016   Lab Results  Component Value Date   CHOL 176 05/12/2016   TRIG 312 (H) 05/12/2016   HDL 55 05/12/2016   CHOLHDL 3.2 05/12/2016    VLDL 62 (H) 05/12/2016   LDLCALC 59 05/12/2016   Physical Findings: AIMS: Facial and Oral Movements Muscles of Facial Expression: None, normal Lips and Perioral Area: None, normal Jaw: None, normal Tongue: None, normal,Extremity Movements Upper (arms, wrists, hands, fingers): None, normal Lower (legs, knees, ankles, toes): None, normal, Trunk Movements Neck, shoulders, hips: None, normal, Overall Severity Severity of abnormal movements (highest score from questions above): None, normal Incapacitation due to abnormal movements:  None, normal Patient's awareness of abnormal movements (rate only patient's report): No Awareness, Dental Status Current problems with teeth and/or dentures?: No Does patient usually wear dentures?: No  CIWA:    COWS:     Musculoskeletal: Strength & Muscle Tone: within normal limits Gait & Station: normal Patient leans: N/A  Psychiatric Specialty Exam: Physical Exam  Nursing note and vitals reviewed.   Review of Systems  Psychiatric/Behavioral: Positive for hallucinations and substance abuse (Hx. THC use). Negative for depression (Denies symptoms of depression), memory loss and suicidal ideas. The patient is not nervous/anxious and does not have insomnia.     Blood pressure (!) 129/98, pulse (!) 122, temperature 98.1 F (36.7 C), temperature source Oral, resp. rate 18, height 5\' 6"  (1.676 m), weight 66.2 kg (146 lb).Body mass index is 23.57 kg/m.  General Appearance: Casual and Disheveled  Eye Contact:  Fair  Speech:  Clear and Coherent and Normal Rate  Volume:  Normal  Mood:  Euthymic  Affect:  Blunt and Congruent  Thought Process:  Coherent, Goal Directed and Descriptions of Associations: Loose  Orientation:  Full (Time, Place, and Person)  Thought Content:  Delusions and Paranoid Ideation  Suicidal Thoughts:  No  Homicidal Thoughts:  No  Memory:  Immediate;   Fair Recent;   Fair Remote;   Fair  Judgement:  Poor  Insight:  Lacking   Psychomotor Activity:  Normal  Concentration:  Concentration: Fair  Recall:  [employer] of Knowledge:  Fair  Language:  Fair  Akathisia:  No  Handed:    AIMS (if indicated):     Assets:  Resilience  ADL's:  Intact  Cognition:  WNL  Sleep: 5.25      Treatment Plan Summary: Daily contact with patient to assess and evaluate symptoms and progress in treatment:  - Continue inpatient hospitalization.  - Will continue today 01/20/2018 plan as below except where it is noted.  Mood control.    - Continue paliperidone injectable [name]) 156 mg/ml IM due to be given on 01-22-18.    - Continue paliperidone 6 mg po daily.   EPS.    - Continue Cogentin 1 mg po prn.  Anxiety.    - Continue Hydroxyzine 25 mg po prn Q 6 hours.  Asthma/SOB.    - Continue Albuterol inhaler 108  (90 base) 2 puffs Q 4 hours prn.  Insomnia.    - Continue Trazodone 50 po Q hs prn.  Agitation/psychosis.    - Continue the agitation protocols using Geodon 20 mg IM Q 12 hrs prn & Lorazepam 1 mg po Q 12 hours prn.     - Staff to continue to encourage patient to attend group sessions.    - Discharge disposition ongoing.  Armandina Stammer, NP, pmhnp, fnp-bc. 01/20/2018, 1:00 PMPatient ID: [name], male   DOB: 1991/01/28, 27 y.o.   MRN: [id number]

## 2018-01-20 NOTE — [facility] Group Notes (Signed)
[facility] Group Notes:  (Nursing/MHT/Case Management/Adjunct)  Date:  01/20/2018  Time:  10:16 AM  Type of Therapy:  Orientation/Goals group  Participation Level:  Did Not Attend  Participation Quality:  Did Not Attend  Affect:  Did Not Attend  Cognitive:  Did Not Attend  Insight:  None  Engagement in Group:  Did Not Attend  Modes of Intervention:  Did Not Attend  Summary of Progress/Problems: Pt did not attend patient self inventory group/orientation group. Summary of Progress/Problems:  Chaz Ronning Shanta 01/20/2018, 10:16 AM 

## 2018-01-20 NOTE — [facility] Group Notes (Signed)
[facility] LCSW Group Therapy Note  Date/Time:  01/20/2018  11:00AM-12:00PM  Type of Therapy and Topic:  Group Therapy:  Music and Mood  Participation Level:  Did Not Attend   Description of Group: In this process group, members listened to a variety of genres of music and identified that different types of music evoke different responses.  Patients were encouraged to identify music that was soothing for them and music that was energizing for them.  Patients discussed how this knowledge can help with wellness and recovery in various ways including managing depression and anxiety as well as encouraging healthy sleep habits.    Therapeutic Goals: 1. Patients will explore the impact of different varieties of music on mood 2. Patients will verbalize the thoughts they have when listening to different types of music 3. Patients will identify music that is soothing to them as well as music that is energizing to them 4. Patients will discuss how to use this knowledge to assist in maintaining wellness and recovery 5. Patients will explore the use of music as a coping skill  Summary of Patient Progress:  N/A  Therapeutic Modalities: Solution Focused Brief Therapy Activity   Ambrose MantleMareida Grossman-Orr, LCSW

## 2018-01-20 NOTE — Progress Notes (Signed)
Patient ID: Adrian GivensMarlo Warner, male   DOB: [birth year], 27 y.o.   MRN: [id number]  Nursing Progress Note [id number]  Data: Patient presents with inappropriate laughter and paranoia. Patient is isolative to his room but is complaint with scheduled medications. Patient refusing to attend groups or go to the cafeteria. Patient denies SI/HI/AVH or pain. Patient contracts for safety on the unit at this time. Patient completed self-inventory sheet and rates depression, hopelessness, and anxiety 0,0,0 respectively. Patient states goal for today is "no" per inventory sheet. Patient with poor insight and limited thinking.   Action: Patient educated about and provided medication per provider's orders. Patient safety maintained with q15 min safety checks. Low fall risk precautions in place. Emotional support given. 1:1 interaction and active listening provided. Patient encouraged to attend meals and groups. Labs, vital signs and patient behavior monitored throughout shift. Patient encouraged to work on treatment plan and goals.  Response: Patient remains safe on the unit at this time. Patient is observed mainly in his room. Will continue to support and monitor.

## 2018-01-21 NOTE — Progress Notes (Signed)
D: Pt denies SI/HI/AVH. Pt is paranoid, pt isolates to his room . Pt has minimal interaction with others.  A: Pt was offered support and encouragement. Pt was given scheduled medications. Pt was encourage to attend groups. Q 15 minute checks were done for safety.  R: Pt is taking medication. Pt has no complaints.Pt receptive to treatment and safety maintained on unit.

## 2018-01-21 NOTE — Progress Notes (Signed)
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 He had agitation in the ED requiring PRN of geodon, ativan, and benadryl. IVC was placed in the ED, and [facility]. He was started on trial of Invega oral form and transitioned to long-acting injectable Sustenna. Pt has been reporting improvement of his presenting symptoms.  Today upon evaluation, pt shares, "I'm trying to get home." He denies any specific complaints aside from seeking his discharge. He denies any physical concerns. He is sleeping well. His appetite is good. He denies SI/HI/AH/VH. He denies paranoia. He denies symptoms of derealization. He is tolerating his medications well, and he is in agreement to continue on current regimen without changes. Discussed with patient about plan to give booster injection of [place] Sustenna tomorrow, and he is in agreement. He had no further questions, comments, or concerns.   Principal Problem: Schizophrenia (HCC) Diagnosis:   Patient Active Problem List   Diagnosis Date Noted  . Psychosis (HCC) [F29]   . Schizophrenia (HCC) [F20.9] 05/12/2016  . Cannabis use disorder, moderate, dependence (HCC) [F12.20] 05/10/2016   Total Time spent with patient: 30 minutes  Past Psychiatric History: see H&P  Past Medical History:  Past Medical History:  Diagnosis Date  . Depression     Past Surgical History:  Procedure Laterality Date  . BACK SURGERY     Family History: History reviewed. No pertinent family history. Family Psychiatric  History: see  H&P Social History:  Social History   Substance and Sexual Activity  Alcohol Use No     Social History   Substance and Sexual Activity  Drug Use Yes  . Types: Marijuana   Comment: rare    Social History   Socioeconomic History  . Marital status: Single    Spouse name: Not on file  . Number of children: Not on file  . Years of education: Not on file  . Highest education level: Not on file  Occupational History  . Not on file  Social Needs  . Financial resource strain: Not on file  . Food insecurity:    Worry: Not on file    Inability: Not on file  . Transportation needs:    Medical: Not on file    Non-medical: Not on file  Tobacco Use  . Smoking status: Current Some Day Smoker  . Smokeless tobacco: Never Used  Substance and Sexual Activity  . Alcohol use: No  . Drug use: Yes    Types: Marijuana    Comment: rare  . Sexual activity: Yes    Birth control/protection: Condom  Lifestyle  . Physical activity:    Days per week: Not on file    Minutes per session: Not on file  . Stress: Not on file  Relationships  . Social connections:    Talks on phone: Not on file    Gets together: Not on file    Attends religious service: Not on file    Active member of club or organization: Not on file    Attends meetings of clubs or organizations: Not  on file    Relationship status: Not on file  Other Topics Concern  . Not on file  Social History Narrative  . Not on file   Additional Social History:                         Sleep: Good  Appetite:  Good  Current Medications: Current Facility-Administered Medications  Medication Dose Route Frequency Provider Last Rate Last Dose  . acetaminophen (TYLENOL) tablet 650 mg  650 mg Oral Q6H PRN Laveda Abbe, NP      . albuterol (PROVENTIL HFA;VENTOLIN HFA) 108 (90 Base) MCG/ACT inhaler 2 puff  2 puff Inhalation Q4H PRN Rankin, Shuvon B, NP   2 puff at 01/17/18 1757  . alum & mag hydroxide-simeth  (MAALOX/MYLANTA) 200-200-20 MG/5ML suspension 30 mL  30 mL Oral Q4H PRN Laveda Abbe, NP      . benztropine (COGENTIN) tablet 1 mg  1 mg Oral BID PRN Micheal Likens, MD      . hydrOXYzine (ATARAX/VISTARIL) tablet 25 mg  25 mg Oral TID PRN Micheal Likens, MD   25 mg at 01/20/18 2102  . magnesium hydroxide (MILK OF MAGNESIA) suspension 30 mL  30 mL Oral Daily PRN Laveda Abbe, NP      . Melene Muller ON 01/22/2018] paliperidone (INVEGA SUSTENNA) injection 156 mg  156 mg Intramuscular Q30 days Jolyne Loa T, MD      . paliperidone (INVEGA) 24 hr tablet 6 mg  6 mg Oral Daily Laveda Abbe, NP   6 mg at 01/21/18 0745  . traZODone (DESYREL) tablet 100 mg  100 mg Oral QHS PRN [name] A, NP   100 mg at 01/20/18 2101  . ziprasidone (GEODON) injection 20 mg  20 mg Intramuscular Q12H PRN Micheal Likens, MD        Lab Results: No results found for this or any previous visit (from the past 48 hour(s)).  Blood Alcohol level:  Lab Results  Component Value Date   ETH <10 01/15/2018   ETH <5 05/25/2017    Metabolic Disorder Labs: Lab Results  Component Value Date   HGBA1C 5.2 05/12/2016   Lab Results  Component Value Date   PROLACTIN 30.5 (H) 05/12/2016   Lab Results  Component Value Date   CHOL 176 05/12/2016   TRIG 312 (H) 05/12/2016   HDL 55 05/12/2016   CHOLHDL 3.2 05/12/2016   VLDL 62 (H) 05/12/2016   LDLCALC 59 05/12/2016    Physical Findings: AIMS: Facial and Oral Movements Muscles of Facial Expression: None, normal Lips and Perioral Area: None, normal Jaw: None, normal Tongue: None, normal,Extremity Movements Upper (arms, wrists, hands, fingers): None, normal Lower (legs, knees, ankles, toes): None, normal, Trunk Movements Neck, shoulders, hips: None, normal, Overall Severity Severity of abnormal movements (highest score from questions above): None, normal Incapacitation due to abnormal movements: None,  normal Patient's awareness of abnormal movements (rate only patient's report): No Awareness, Dental Status Current problems with teeth and/or dentures?: No Does patient usually wear dentures?: No  CIWA:    COWS:     Musculoskeletal: Strength & Muscle Tone: within normal limits Gait & Station: normal Patient leans: N/A  Psychiatric Specialty Exam: Physical Exam  Nursing note and vitals reviewed.   Review of Systems  Constitutional: Negative for chills and fever.  Respiratory: Negative for cough and shortness of breath.   Cardiovascular: Negative for chest pain.  Gastrointestinal: Negative for abdominal pain,  heartburn, nausea and vomiting.  Psychiatric/Behavioral: Negative for depression, hallucinations and suicidal ideas. The patient is not nervous/anxious and does not have insomnia.     Blood pressure 134/90, pulse (!) 114, temperature 98.1 F (36.7 C), temperature source Oral, resp. rate 18, height 5\' 6"  (1.676 m), weight 66.2 kg (146 lb).Body mass index is 23.57 kg/m.  General Appearance: Casual and Fairly Groomed  Eye Contact:  Good  Speech:  Clear and Coherent and Normal Rate  Volume:  Normal  Mood:  Euthymic  Affect:  Appropriate, Blunt and Congruent  Thought Process:  Coherent and Goal Directed  Orientation:  Full (Time, Place, and Person)  Thought Content:  Logical  Suicidal Thoughts:  No  Homicidal Thoughts:  No  Memory:  Immediate;   Fair Recent;   Fair Remote;   Fair  Judgement:  Fair  Insight:  Lacking  Psychomotor Activity:  Normal  Concentration:  Concentration: Fair  Recall:  [employer] of Knowledge:  Fair  Language:  Fair  Akathisia:  No  Handed:    AIMS (if indicated):     Assets:  IT sales professional Social Support  ADL's:  Intact  Cognition:  WNL  Sleep:  Number of Hours: 6   Treatment Plan Summary: Daily contact with patient to assess and evaluate symptoms and progress in treatment and Medication management    -Continue inpatient hospitalization  -Schizophrenia             - Continue Invega 6mg  po qDay             -  Continue Invega Sustenna 156mg  IM q30 days administer on 01/22/18 Hinda Glatter Sustenna 234mg  IM once given on 01/18/18)  -EPS             - Continue cogentin 1mg  po BID  -Anxiety             - Continue vistaril 25mg  po TID prn anxiety  -Asthma/SOB.   - Continue Albuterol inhaler 108  (90 base) 2 puffs Q 4 hours prn.  -Insomnia             - Continue trazodone 50mg  po qhs prn insomnia  - Agitation             -Continue agitation protocol with geodon/ativan  -Encourage participation in groups and therapeutic milieu  -Disposition planning will be ongoing  Micheal Likens, MD 01/21/2018, 1:12 PM

## 2018-01-21 NOTE — Plan of Care (Signed)
  Problem: Safety: Goal: Periods of time without injury will increase Outcome: Progressing   Problem: Coping: Goal: Coping ability will improve Outcome: Not Progressing   Problem: Coping: Goal: Will verbalize feelings Outcome: Not Progressing   

## 2018-01-21 NOTE — Progress Notes (Signed)
Patient ID: [name], male   DOB: 07-24-1991, 27 y.o.   MRN: [id number]  Nursing Progress Note [id number]  Data: Patient presents with paranoia and inappropriate laughter this morning. Patient is isolative to his room and observed responding to internal stimuli. Patient does come out of his room at times and is seen sitting in the middle of the hallway. Patient complaint with scheduled medications. Patient denies SI/HI/AVH or pain. Patient contracts for safety on the unit at this time. Patient completed self-inventory sheet and rates depression, hopelessness, and anxiety 0,0,0 respectively. Patient does not have a goal for the day. Patient reports he had "intense, scary dreams last night".  Action: Patient educated about and provided medication per provider's orders. Patient safety maintained with q15 min safety checks. Low fall risk precautions in place. Emotional support given. 1:1 interaction and active listening provided. Patient encouraged to attend meals and groups. Labs, vital signs and patient behavior monitored throughout shift. Patient encouraged to work on treatment plan and goals.  Response: Patient remains safe on the unit at this time. Patient is isolative to his room. Will continue to support and monitor.

## 2018-01-21 NOTE — Progress Notes (Signed)
Recreation Therapy Notes   Date: 2.27.19 Time: 10:45 a.m. Location: [address] Dayroom   Group Topic: Coping Skills   Goal Area(s) Addresses:  Goal 1.1: To improve coping skills  - Group will identify at least three new coping skills  - Group will identify at least three healthy coping skills they have  - Group will be able to identify how coping skills can improve their wellness  Intervention: Craft   Activity: Coping Skill Vision Board: Patients created a coping skills vison board using the magazines provided. Patients were expected to find at least three new coping skills as well as identify three coping skills they already have. Once the activity was completed, patients shared their vision board with the group.   Education: Coping Skills   Education Outcome: Acknowledges Education  Clinical Observations/Feedback: Patient did not attend   Sheryle HailDarian Armonee Bojanowski, Recreation Therapy Intern   Sheryle HailDarian Kavish Lafitte 01/21/2018 11:56 AM

## 2018-01-22 MED ORDER — TRAZODONE HCL 100 MG PO TABS: 100.0000 mg | ORAL_TABLET | Freq: Every evening | ORAL | 0 refills | Status: DC | PRN

## 2018-01-22 MED ORDER — PALIPERIDONE ER 6 MG PO TB24: 6.0000 mg | ORAL_TABLET | Freq: Every day | ORAL | 0 refills | Status: DC

## 2018-01-22 MED ORDER — PALIPERIDONE PALMITATE 156 MG/ML IM SUSP: 156.0000 mg | INTRAMUSCULAR | 0 refills | Status: DC

## 2018-01-22 MED ORDER — ALBUTEROL SULFATE HFA 108 (90 BASE) MCG/ACT IN AERS: 2.0000 | INHALATION_SPRAY | RESPIRATORY_TRACT | Status: DC | PRN

## 2018-01-22 MED ORDER — BENZTROPINE MESYLATE 1 MG PO TABS: 1.0000 mg | ORAL_TABLET | Freq: Two times a day (BID) | ORAL | 0 refills | Status: DC | PRN

## 2018-01-22 MED ORDER — HYDROXYZINE HCL 25 MG PO TABS: 25.0000 mg | ORAL_TABLET | Freq: Three times a day (TID) | ORAL | 0 refills | Status: DC | PRN

## 2018-01-22 NOTE — Progress Notes (Signed)
Pt discharged with all his belongings, scripts, and aftercare appt information.  Pt endorses no complaints and was discharged with bus passes.

## 2018-01-22 NOTE — Discharge Summary (Addendum)
Physician Discharge Summary Note  Patient:  [name] is an 27 y.o., male  MRN:  [id number]  DOB:  05/06/1991  Patient phone:  [phone] (home)   Patient address:   [address] [place] [address],   Total Time spent with patient: Greater than 30 minutes  Date of Admission:  01/16/2018  Date of Discharge: 01/22/18  Reason for Admission: Feelings of derealization, paranoia, and SI without specific plan.  Principal Problem: Schizophrenia [facility](HCC)  Discharge Diagnoses: Patient Active Problem List   Diagnosis Date Noted  . Psychosis (HCC) [F29]   . Schizophrenia (HCC) [F20.9] 05/12/2016  . Cannabis use disorder, moderate, dependence (HCC) [F12.20] 05/10/2016   Past Psychiatric History: Not much information is available at this time. Appears that he has a history of schizophrenia. [facility]. He was [facility] from jail   Past Medical History:      Past Medical History:  Diagnosis Date  . Depression          Past Surgical History:  Procedure Laterality Date  . BACK SURGERY     Family History: History reviewed. No pertinent family history.  Family Psychiatric  History: Unable to obtain at this time  Tobacco Screening: Have you used any form of tobacco in the last 30 days? (Cigarettes, Smokeless Tobacco, Cigars, and/or Pipes): No  Social History: Unable to obtain at this time    History  Alcohol Use No          History  Drug Use  . Types: Marijuana    Comment: rare   Social History:  Social History   Substance and Sexual Activity  Alcohol Use No     Social History   Substance and Sexual Activity  Drug Use Yes  . Types: Marijuana   Comment: rare    Social History   Socioeconomic History  . Marital status: Single    Spouse name: Not on file  . Number of children: Not on file  . Years of education: Not on file  . Highest education level: Not on file  Occupational History  . Not on file  Social Needs   . Financial resource strain: Not on file  . Food insecurity:    Worry: Not on file    Inability: Not on file  . Transportation needs:    Medical: Not on file    Non-medical: Not on file  Tobacco Use  . Smoking status: Current Some Day Smoker  . Smokeless tobacco: Never Used  Substance and Sexual Activity  . Alcohol use: No  . Drug use: Yes    Types: Marijuana    Comment: rare  . Sexual activity: Yes    Birth control/protection: Condom  Lifestyle  . Physical activity:    Days per week: Not on file    Minutes per session: Not on file  . Stress: Not on file  Relationships  . Social connections:    Talks on phone: Not on file    Gets together: Not on file    Attends religious service: Not on file    Active member of club or organization: Not on file    Attends meetings of clubs or organizations: Not on file    Relationship status: Not on file  Other Topics Concern  . Not on file  Social History Narrative  . Not on file   Hospital Course:  (Per Md's discharge SRA): [name] is a 27 y/o M with history of schizophreniform disorder  and intermittent explosive disorder who was [facility] from [facility] on IVC placed in the ED after he was brought in by emergency services whom he called with complaint of shortness of breath and wheezing, but then also reported feelings of derealization, paranoia, and SI without specific plan. He had agitation in the ED requiring PRN of geodon, ativan, and benadryl. IVC was placed in the ED, and pt was transferred to [facility] for additional treatment and evaluation.He was started on trial of Invega oral formandtransitionedto long-acting injectable Sustenna. Pt has been reporting improvement of his presenting symptoms.  Besides the use of paliperidone injectable 156 mg/ml IM for mood control & paliperidone tablets 6 mg po for mood control, [name] was also medicated & discharged on; Cogentin 1 mg for EPS. Hydroxyzine 25 mg prn for anxiety & Trazodone 100 mg for  insomnia. He was enrolled in the group counseling sessions being offered & held on this unit. He declined to attend citing, he does not like to be around people. He tolerated his treatment regimen without any adverse effects or reactions reported.   Today upon his discharge evaluation, pt shares, "I'm ready to go. I feel strong - I feel good." He denies any specific complaints. He is sleeping well. His appetite is good. He denies physical complaints. He denies SI/HI/AH/VH. He denies paranoia. He reports that his medications have been helpful. He is in agreement to continue his current treatment regimen and to follow up at [facility] for future Invega Sustenna Injections, and he will receive Gean Birchwood booster injection today before discharge. He was able to engage in safety planning including plan to return to [facility] or contact emergency services if he feels unable to maintain his own safety or the safety of others. Pt had no further questions  Upon discharge, [name] presents mentally & medically stable. He is to continue mental health care on an outpatient basis as noted below. He was provided with all the necessary information needed to make this appointment without problems. He left [facility] with all personal belongs in no apparent distress. Transportation per family.  Physical Findings: AIMS: Facial and Oral Movements Muscles of Facial Expression: None, normal Lips and Perioral Area: None, normal Jaw: None, normal Tongue: None, normal,Extremity Movements Upper (arms, wrists, hands, fingers): None, normal Lower (legs, knees, ankles, toes): None, normal, Trunk Movements Neck, shoulders, hips: None, normal, Overall Severity Severity of abnormal movements (highest score from questions above): None, normal Incapacitation due to abnormal movements: None, normal Patient's awareness of abnormal movements (rate only patient's report): No Awareness, Dental Status Current problems with teeth and/or dentures?:  No Does patient usually wear dentures?: No  CIWA:    COWS:     Musculoskeletal: Strength & Muscle Tone: within normal limits Gait & Station: normal Patient leans: N/A  Psychiatric Specialty Exam: Physical Exam  Nursing note and vitals reviewed. Constitutional: He appears well-developed.  HENT:  Head: Normocephalic.  Eyes: Pupils are equal, round, and reactive to light.  Neck: Normal range of motion.  Cardiovascular: Normal rate.  Respiratory: Effort normal.  GI: Soft.  Genitourinary:  Genitourinary Comments: Deferred  Musculoskeletal: Normal range of motion.  Neurological: He is alert.  Skin: Skin is warm.    Review of Systems  Constitutional: Negative.   HENT: Negative.   Eyes: Negative.   Respiratory: Negative.   Cardiovascular: Negative.   Gastrointestinal: Negative.   Genitourinary: Negative.   Musculoskeletal: Negative.   Skin: Negative.   Neurological: Negative.   Endo/Heme/Allergies: Negative.   Psychiatric/Behavioral: Positive for  depression (Stable), hallucinations (Hx. Psychosis, paranoia (stable)) and substance abuse (Hx. THC use disorder). Negative for memory loss and suicidal ideas. The patient has insomnia (Stable). The patient is not nervous/anxious.     Blood pressure (!) 151/113, pulse 95, temperature 97.8 F (36.6 C), temperature source Oral, resp. rate 20, height 5\' 6"  (1.676 m), weight 66.2 kg (146 lb).Body mass index is 23.57 kg/m.  See H&P   Have you used any form of tobacco in the last 30 days? (Cigarettes, Smokeless Tobacco, Cigars, and/or Pipes): Yes  Has this patient used any form of tobacco in the last 30 days? (Cigarettes, Smokeless Tobacco, Cigars, and/or Pipes): N/A  Blood Alcohol level:  Lab Results  Component Value Date   ETH <10 01/15/2018   ETH <5 05/25/2017   Metabolic Disorder Labs:  Lab Results  Component Value Date   HGBA1C 5.2 05/12/2016   Lab Results  Component Value Date   PROLACTIN 30.5 (H) 05/12/2016   Lab  Results  Component Value Date   CHOL 176 05/12/2016   TRIG 312 (H) 05/12/2016   HDL 55 05/12/2016   CHOLHDL 3.2 05/12/2016   VLDL 62 (H) 05/12/2016   LDLCALC 59 05/12/2016   See Psychiatric Specialty Exam and Suicide Risk Assessment completed by Attending Physician prior to discharge.  Discharge destination:  Home  Is patient on multiple antipsychotic therapies at discharge: No,   Do you recommend tapering to monotherapy for antipsychotics?  NA    Has Patient had three or more failed trials of antipsychotic monotherapy by history:  No  Recommended Plan for Multiple Antipsychotic Therapies: NA  Allergies as of 01/22/2018      Reactions   Shellfish Allergy Hives, Itching      Medication List    STOP taking these medications   predniSONE 10 MG tablet Commonly known as:  DELTASONE     TAKE these medications     Indication  albuterol 108 (90 Base) MCG/ACT inhaler Commonly known as:  PROVENTIL HFA;VENTOLIN HFA Inhale 2 puffs into the lungs every 4 (four) hours as needed for wheezing or shortness of breath (cough).  Indication:  Asthma   benztropine 1 MG tablet Commonly known as:  COGENTIN Take 1 tablet (1 mg total) by mouth 2 (two) times daily as needed (EPS). What changed:    when to take this  reasons to take this  Indication:  Extrapyramidal Reaction caused by Medications   hydrOXYzine 25 MG tablet Commonly known as:  ATARAX/VISTARIL Take 1 tablet (25 mg total) by mouth 3 (three) times daily as needed (mild/moderate anxiety).  Indication:  Feeling Anxious   paliperidone 156 MG/ML Susp injection Commonly known as:  INVEGA SUSTENNA Inject 1 mL (156 mg total) into the muscle every 30 (thirty) days. For mood control Start taking on:  02/21/2018 What changed:    medication strength  how much to take  additional instructions  Indication:  Mood control   paliperidone 6 MG 24 hr tablet Commonly known as:  INVEGA Take 1 tablet (6 mg total) by mouth at bedtime.  For mood control What changed:  additional instructions  Indication:  Mood control   traZODone 100 MG tablet Commonly known as:  DESYREL Take 1 tablet (100 mg total) by mouth at bedtime as needed for sleep.  Indication:  Trouble Sleeping      Follow-up Information    [facility] Follow up on 01/24/2018.   Why:  Thursday at 8AM with Tresa Endo for your hospital follow up appointment.  Bring along  your ID and your hospital d/c paperwork Contact information: [address] [place] [address] [phone]          Follow-up care recommendation: Activity:  As tolerated Diet: As recommended by your primary care doctor. Keep all scheduled follow-up appointments as recommended.  Comments: Patient is instructed prior to discharge to: Take all medications as prescribed by his/her mental healthcare provider. Report any adverse effects and or reactions from the medicines to his/her outpatient provider promptly. Patient has been instructed & cautioned: To not engage in alcohol and or illegal drug use while on prescription medicines. In the event of worsening symptoms, patient is instructed to call the crisis hotline, 911 and or go to the nearest ED for appropriate evaluation and treatment of symptoms. To follow-up with his/her primary care provider for your other medical issues, concerns and or health care needs.   Signed: Armandina Stammer, NP, PMHNP, FNP-BC 01/22/2018, 9:43 AM   Patient seen, Suicide Assessment Completed.  Disposition Plan Reviewed

## 2018-01-22 NOTE — Plan of Care (Signed)
4.16.19. Patient did not meet goal due to not attending groups

## 2018-01-22 NOTE — Progress Notes (Signed)
Recreation Therapy Notes  INPATIENT RECREATION TR PLAN  Patient Details Name: Tharon Bomar MRN: [id number] DOB: July 26, 1991 Today's Date: 01/22/2018  Rec Therapy Plan Is patient appropriate for Therapeutic Recreation?: Yes Treatment times per week: At least three  Estimated Length of Stay: 5-7 days  TR Treatment/Interventions: Group participation (Appropriate participation in Recreation Therapy group tx.)  Discharge Criteria Pt will be discharged from therapy if:: Discharged Treatment plan/goals/alternatives discussed and agreed upon by:: Patient/family  Discharge Summary Short term goals set: See Care Plans  Short term goals met: Not met Reason goals not met: Patient did not attend groups  Therapeutic equipment acquired: None  Reason patient discharged from therapy: Discharge from hospital Pt/family agrees with progress & goals achieved: Yes Date patient discharged from therapy: 01/22/18  Ranell Patrick, Recreation Therapy Intern   Ranell Patrick 01/22/2018, 12:02 PM

## 2018-01-22 NOTE — Progress Notes (Signed)
Recreation Therapy Notes  Date: 4.16.19 Time: 10:00 p.m. Location: [address] Dayroom   Group Topic: Self-Esteem   Goal Area(s) Addresses:  Goal 1.1: To increase self-esteem  - Group will improve mood through participation during Recreation Therapy tx.  - Group will identify at least one positive affirmation by the end of therapy session.   - Group will identify the importance of self-esteem    Intervention: Art   Activity: Patients were to choose a positive affirmation template. Patents were then given the opportunity to design their poster using the colored pencils and markers provided. Afterwards, Patients shared their positive affirmation to the group and explained how it can help improve their self-esteem.   Education: Self-Esteem   Education Outcome: Acknowledges Education  Clinical Observations/Feedback: Patient did not attend   Sheryle HailDarian Venna Berberich, Recreation Therapy Intern  Sheryle HailDarian Brittish Bolinger 01/22/2018 11:59 AM

## 2018-01-22 NOTE — Progress Notes (Signed)
  [facility] Discharge Plan :  Will you be returning to the same living situation after discharge:  Yes,  home At discharge, do you have transportation home?: Yes,  family Do you have the ability to pay for your medications: Yes,  mental health  Release of information consent forms completed and in the chart;  Patient's signature needed at discharge.  Patient to Follow up at: Follow-up Information    [facility] Follow up on 01/24/2018.   Why:  Thursday at 8AM with Tresa EndoKelly for your hospital follow up appointment.  Bring along your ID and your hospital d/c paperwork Contact information: [address] [place] [address] [phone]           Next level of care provider has access to [facility] Link:no  Safety Planning and Suicide Prevention discussed: Yes,  yes  Have you used any form of tobacco in the last 30 days? (Cigarettes, Smokeless Tobacco, Cigars, and/or Pipes): Yes  Has patient been referred to the Quitline?: Patient refused referral  Patient has been referred for addiction treatment: Pt. refused referral  Ida RogueRodney B Morris Longenecker, LCSW 01/22/2018, 8:47 AM

## 2018-01-22 NOTE — [facility] Suicide Risk Assessment (Signed)
[facility]   Principal Problem: Schizophrenia [facility](HCC) Discharge Diagnoses:  Patient Active Problem List   Diagnosis Date Noted  . Psychosis (HCC) [F29]   . Schizophrenia (HCC) [F20.9] 05/12/2016  . Cannabis use disorder, moderate, dependence (HCC) [F12.20] 05/10/2016    Total Time spent with patient: 30 minutes  Musculoskeletal: Strength & Muscle Tone: within normal limits Gait & Station: normal Patient leans: N/A  Psychiatric Specialty Exam: Review of Systems  Constitutional: Negative for chills and fever.  Respiratory: Negative for cough and shortness of breath.   Cardiovascular: Negative for chest pain.  Gastrointestinal: Negative for abdominal pain, heartburn, nausea and vomiting.  Psychiatric/Behavioral: Negative for depression, hallucinations and suicidal ideas. The patient is not nervous/anxious and does not have insomnia.     Blood pressure (!) 151/113, pulse 95, temperature 97.8 F (36.6 C), temperature source Oral, resp. rate 20, height 5\' 6"  (1.676 m), weight 66.2 kg (146 lb).Body mass index is 23.57 kg/m.  General Appearance: Casual and Fairly Groomed  Patent attorneyye Contact::  Good  Speech:  Clear and Coherent and Normal Rate  Volume:  Normal  Mood:  Euthymic  Affect:  Appropriate and Congruent  Thought Process:  Coherent and Goal Directed  Orientation:  Full (Time, Place, and Person)  Thought Content:  Logical  Suicidal Thoughts:  No  Homicidal Thoughts:  No  Memory:  Immediate;   Fair Recent;   Fair Remote;   Fair  Judgement:  Fair  Insight:  Fair  Psychomotor Activity:  Normal  Concentration:  Fair  Recall:  FiservFair  Fund of Knowledge:Fair  Language: Fair  Akathisia:  No  Handed:    AIMS (if indicated):     Assets:  Manufacturing systems engineerCommunication Skills Physical Health Resilience Social Support  Sleep:  Number of Hours: 6.5  Cognition: WNL  ADL's:  Intact   Mental Status Per Nursing Assessment::   On Admission:     Demographic Factors:  Male  and Low socioeconomic status  Loss Factors: NA  Historical Factors: Impulsivity  Risk Reduction Factors:   Positive social support, Positive therapeutic relationship and Positive coping skills or problem solving skills  Continued Clinical Symptoms:  Schizophrenia:   Less than 26 years old Paranoid or undifferentiated type  Cognitive Features That Contribute To Risk:  None    Suicide Risk:  Minimal: No identifiable suicidal ideation.  Patients presenting with no risk factors but with morbid ruminations; may be classified as minimal risk based on the severity of the depressive symptoms  Follow-up Information    [facility] Follow up on 01/24/2018.   Why:  Thursday at 8AM with Tresa EndoKelly for your hospital follow up appointment.  Bring along your ID and your hospital d/c paperwork Contact information: [address] [place] [address] [phone]         Subjective Data:  Adrian GivensMarlo Warner is a 27 y/o M with history of schizophreniform disorder and intermittent explosive disorder who was admitted from [facility] on IVC placed in the ED after he was brought in by emergency services whom he called with complaint of shortness of breath and wheezing, but then also reported feelings of derealization, paranoia, and SI without specific plan. He had agitation in the ED requiring PRN of geodon, ativan, and benadryl. IVC was placed in the ED, and pt was [facility] for additional treatment and evaluation.He was started on trial of Invega oral form and transitioned to long-acting injectable Sustenna. Pt has been reporting improvement of his presenting symptoms.  Today upon  evaluation, pt shares, "I'm ready to go. I feel strong - I feel good." He denies any specific complaints. He is sleeping well. His appetite is good. He denies physical complaints. He denies SI/HI/AH/VH. He denies paranoia. He reports that his medications have been helpful. He is in agreement to continue his current treatment regimen  and to follow up at [facility] for future Invega Sustenna Injections, and he will receive Gean Birchwood booster injection today before discharge. He was able to engage in safety planning including plan to return to [facility] or contact emergency services if he feels unable to maintain his own safety or the safety of others. Pt had no further questions, comments, or concerns.   Plan Of Care/Follow-up recommendations:   -Continue inpatient hospitalization  -Schizophrenia - Discontinue Invega 6mg  po qDay -  Continue Invega Sustenna 156mg  IM q30 days administer on 01/22/18 Hinda Glatter Sustenna 234mg  IM once given on 01/18/18)  -EPS - Continue cogentin 1mg  po BID  -Anxiety - Continue vistaril 25mg  po TID prn anxiety  -Asthma/SOB.             - Continue Albuterol inhaler 108 (90 base) 2 puffs Q 4 hours prn.  -Insomnia - Continue trazodone 50mg  po qhs prn insomnia  Activity:  as tolerated Diet:  normal Tests:  NA Other:  see above for DC plan  Micheal Likens, MD 01/22/2018, 9:10 AM

## 2018-01-23 ENCOUNTER — Other Ambulatory Visit: Payer: Self-pay

## 2018-01-23 ENCOUNTER — Emergency Department (HOSPITAL_COMMUNITY): Payer: Medicaid Other

## 2018-01-23 ENCOUNTER — Encounter (HOSPITAL_COMMUNITY): Payer: Self-pay | Admitting: Nurse Practitioner

## 2018-01-23 ENCOUNTER — Emergency Department (HOSPITAL_COMMUNITY)
Admission: EM | Admit: 2018-01-23 | Discharge: 2018-01-24 | Disposition: A | Discharge status: Home / Self Care | Payer: Medicaid Other | Attending: Emergency Medicine | Admitting: Emergency Medicine

## 2018-01-23 DIAGNOSIS — Z79899 Other long term (current) drug therapy: Secondary | ICD-10-CM | POA: Diagnosis not present

## 2018-01-23 DIAGNOSIS — R062 Wheezing: Secondary | ICD-10-CM | POA: Insufficient documentation

## 2018-01-23 DIAGNOSIS — F17210 Nicotine dependence, cigarettes, uncomplicated: Secondary | ICD-10-CM | POA: Insufficient documentation

## 2018-01-23 DIAGNOSIS — F1220 Cannabis dependence, uncomplicated: Secondary | ICD-10-CM | POA: Diagnosis not present

## 2018-01-23 DIAGNOSIS — F209 Schizophrenia, unspecified: Secondary | ICD-10-CM | POA: Diagnosis not present

## 2018-01-23 DIAGNOSIS — Z008 Encounter for other general examination: Secondary | ICD-10-CM

## 2018-01-23 DIAGNOSIS — F203 Undifferentiated schizophrenia: Secondary | ICD-10-CM

## 2018-01-23 HISTORY — DX: Schizophrenia, unspecified: F20.9

## 2018-01-23 LAB — COMPREHENSIVE METABOLIC PANEL
ALT: 22 U/L (ref 17–63)
AST: 33 U/L (ref 15–41)
Albumin: 4.7 g/dL (ref 3.5–5.0)
Alkaline Phosphatase: 46 U/L (ref 38–126)
Anion gap: 14 (ref 5–15)
BUN: 15 mg/dL (ref 6–20)
CO2: 25 mmol/L (ref 22–32)
Calcium: 10.1 mg/dL (ref 8.9–10.3)
Chloride: 101 mmol/L (ref 101–111)
Creatinine, Ser: 0.99 mg/dL (ref 0.61–1.24)
GFR calc Af Amer: >60 mL/min (ref >60)
GFR calc non Af Amer: >60 mL/min (ref >60)
Glucose, Bld: 117 mg/dL — ABNORMAL HIGH (ref 65–99)
Potassium: 4.2 mmol/L (ref 3.5–5.1)
Sodium: 140 mmol/L (ref 135–145)
Total Bilirubin: 0.7 mg/dL (ref 0.3–1.2)
Total Protein: 7.9 g/dL (ref 6.5–8.1)

## 2018-01-23 LAB — CBC WITH DIFFERENTIAL/PLATELET
Basophils Absolute: 0 10*3/uL (ref 0.0–0.1)
Basophils Relative: 0 %
Eosinophils Absolute: 0.3 10*3/uL (ref 0.0–0.7)
Eosinophils Relative: 4 %
HCT: 47.9 % (ref 39.0–52.0)
Hemoglobin: 16.4 g/dL (ref 13.0–17.0)
Lymphocytes Relative: 28 %
Lymphs Abs: 2.2 10*3/uL (ref 0.7–4.0)
MCH: 30.8 pg (ref 26.0–34.0)
MCHC: 34.2 g/dL (ref 30.0–36.0)
MCV: 90 fL (ref 78.0–100.0)
Monocytes Absolute: 0.6 10*3/uL (ref 0.1–1.0)
Monocytes Relative: 8 %
Neutro Abs: 4.8 10*3/uL (ref 1.7–7.7)
Neutrophils Relative %: 60 %
Platelets: 203 10*3/uL (ref 150–400)
RBC: 5.32 MIL/uL (ref 4.22–5.81)
RDW: 13.3 % (ref 11.5–15.5)
WBC: 8 10*3/uL (ref 4.0–10.5)

## 2018-01-23 LAB — RAPID URINE DRUG SCREEN, HOSP PERFORMED
Amphetamines: NOT DETECTED
Barbiturates: NOT DETECTED
Benzodiazepines: NOT DETECTED
Cocaine: NOT DETECTED
Opiates: NOT DETECTED
Tetrahydrocannabinol: POSITIVE — AB

## 2018-01-23 LAB — ACETAMINOPHEN LEVEL: Acetaminophen (Tylenol), Serum: <10 ug/mL — ABNORMAL LOW (ref 10–30)

## 2018-01-23 LAB — ETHANOL: Alcohol, Ethyl (B): <10 mg/dL (ref <10)

## 2018-01-23 LAB — SALICYLATE LEVEL: Salicylate Lvl: <7 mg/dL (ref 2.8–30.0)

## 2018-01-23 MED ORDER — HYDROXYZINE HCL 25 MG PO TABS
25.0000 mg | ORAL_TABLET | Freq: Three times a day (TID) | ORAL | Status: DC | PRN
  Administered 2018-01-23: 25 mg via ORAL
  Filled 2018-01-23: qty 1

## 2018-01-23 MED ORDER — BENZTROPINE MESYLATE 1 MG PO TABS: 1.0000 mg | ORAL_TABLET | Freq: Two times a day (BID) | ORAL | Status: DC | PRN

## 2018-01-23 MED ORDER — PREDNISONE 20 MG PO TABS
40.0000 mg | ORAL_TABLET | Freq: Every day | ORAL | Status: DC
  Administered 2018-01-24: 40 mg via ORAL
  Filled 2018-01-23: qty 2

## 2018-01-23 MED ORDER — PREDNISONE 20 MG PO TABS
60.0000 mg | ORAL_TABLET | Freq: Once | ORAL | Status: AC
  Administered 2018-01-23: 60 mg via ORAL
  Filled 2018-01-23: qty 3

## 2018-01-23 MED ORDER — IPRATROPIUM-ALBUTEROL 0.5-2.5 (3) MG/3ML IN SOLN
3.0000 mL | Freq: Once | RESPIRATORY_TRACT | Status: AC
  Administered 2018-01-23: 3 mL via RESPIRATORY_TRACT
  Filled 2018-01-23: qty 3

## 2018-01-23 MED ORDER — PALIPERIDONE ER 6 MG PO TB24
6.0000 mg | ORAL_TABLET | Freq: Every day | ORAL | Status: DC
  Administered 2018-01-23: 6 mg via ORAL
  Filled 2018-01-23 (×2): qty 1

## 2018-01-23 MED ORDER — ALBUTEROL SULFATE HFA 108 (90 BASE) MCG/ACT IN AERS: 2.0000 | INHALATION_SPRAY | RESPIRATORY_TRACT | Status: DC | PRN

## 2018-01-23 MED ORDER — TRAZODONE HCL 100 MG PO TABS
100.0000 mg | ORAL_TABLET | Freq: Every evening | ORAL | Status: DC | PRN
  Administered 2018-01-23: 100 mg via ORAL
  Filled 2018-01-23: qty 1

## 2018-01-23 NOTE — ED Notes (Signed)
SBAR report given to Denise RN

## 2018-01-23 NOTE — ED Notes (Signed)
Report given to RN

## 2018-01-23 NOTE — ED Triage Notes (Signed)
Patient brought in by EMS due to smoking Weed which made his Schizophrenia worse. Patient is complaining of being scared. [facility] called 911 bc he was acting strange.

## 2018-01-23 NOTE — ED Notes (Signed)
Bed: [facility]37 Expected date:  Expected time:  Means of arrival:  Comments: Hold for 27 

## 2018-01-23 NOTE — ED Notes (Signed)
After some coaxing, pt decided to take meds.

## 2018-01-23 NOTE — ED Notes (Signed)
Pt very paranoid, refusing medications, stating he takes too many medications.

## 2018-01-23 NOTE — ED Provider Notes (Signed)
[facility]-EMERGENCY DEPT Provider Note   CSN: [id number] Arrival date & time: 01/23/18  1429     History   Chief Complaint Chief Complaint  Patient presents with  . Medical Clearance  . Wheezing    HPI [name] is a 27 y.o. male with a history of schizophrenia, psychosis, cannabis abuse who presents emergency department today for medical clearance.  Patient states that this morning he smoked a large amount of marijuana.  Since that time he has been having visual and auditory hallucinations.  He will not tell me what he sees or hears.  He denies any SI or HI.  He notes that he has not been taking his home medications.  He states that he is afraid of taking pills.  He notes that he has a therapist on an outpatient basis but has not followed up with him in some time.  He does not have a psychiatrist, psychologist or PCP.  Patient reports that he also has been using cocaine with the last ingestion earlier this morning.  He reports that he became very anxious when he first came in.  He reports for the last 2 days he has been having wheezing as well as chest tightness.  He reports he is out of his home albuterol medication.  He smokes on a daily basis.  He denies any fever, chills, cough, chest pain, hemoptysis, lower leg swelling.  Not taken anything for his symptoms.  Patient denies any other physical complaints at this time.  HPI  Past Medical History:  Diagnosis Date  . Depression   . Schizophrenia [facility])     Patient Active Problem List   Diagnosis Date Noted  . Psychosis (HCC)   . Schizophrenia (HCC) 05/12/2016  . Cannabis use disorder, moderate, dependence (HCC) 05/10/2016    Past Surgical History:  Procedure Laterality Date  . BACK SURGERY          Home Medications    Prior to Admission medications   Medication Sig Start Date End Date Taking? Authorizing Provider  albuterol (PROVENTIL HFA;VENTOLIN HFA) 108 (90 Base) MCG/ACT inhaler Inhale 2  puffs into the lungs every 4 (four) hours as needed for wheezing or shortness of breath (cough). 01/22/18   Armandina Stammer I, NP  benztropine (COGENTIN) 1 MG tablet Take 1 tablet (1 mg total) by mouth 2 (two) times daily as needed (EPS). 01/22/18   Armandina Stammer I, NP  hydrOXYzine (ATARAX/VISTARIL) 25 MG tablet Take 1 tablet (25 mg total) by mouth 3 (three) times daily as needed (mild/moderate anxiety). 01/22/18   Armandina Stammer I, NP  paliperidone (INVEGA SUSTENNA) 156 MG/ML SUSP injection Inject 1 mL (156 mg total) into the muscle every 30 (thirty) days. For mood control 02/21/18   Armandina Stammer I, NP  paliperidone (INVEGA) 6 MG 24 hr tablet Take 1 tablet (6 mg total) by mouth at bedtime. For mood control 01/22/18   Armandina Stammer I, NP  traZODone (DESYREL) 100 MG tablet Take 1 tablet (100 mg total) by mouth at bedtime as needed for sleep. 01/22/18   Sanjuana Kava, NP    Family History History reviewed. No pertinent family history.  Social History Social History   Tobacco Use  . Smoking status: Current Some Day Smoker  . Smokeless tobacco: Never Used  Substance Use Topics  . Alcohol use: No  . Drug use: Yes    Types: Marijuana    Comment: rare     Allergies   Shellfish allergy  Review of Systems Review of Systems  All other systems reviewed and are negative.    Physical Exam Updated Vital Signs Ht 5\' 5"  (1.651 m)   Wt 74.8 kg (165 lb)   BMI 27.46 kg/m   Physical Exam  Constitutional: He appears well-developed and well-nourished.  HENT:  Head: Normocephalic and atraumatic.  Right Ear: External ear normal.  Left Ear: External ear normal.  Nose: Nose normal.  Mouth/Throat: Uvula is midline, oropharynx is clear and moist and mucous membranes are normal. No tonsillar exudate.  Eyes: Pupils are equal, round, and reactive to light. Right eye exhibits no discharge. Left eye exhibits no discharge. No scleral icterus.  Neck: Trachea normal. Neck supple. No spinous process tenderness  present. No neck rigidity. Normal range of motion present.  Cardiovascular: Normal rate, regular rhythm and intact distal pulses.  No murmur heard. Pulses:      Radial pulses are 2+ on the right side, and 2+ on the left side.       Dorsalis pedis pulses are 2+ on the right side, and 2+ on the left side.       Posterior tibial pulses are 2+ on the right side, and 2+ on the left side.  No lower extremity swelling or edema. Calves symmetric in size bilaterally.  Pulmonary/Chest: Effort normal. No accessory muscle usage. No tachypnea. No respiratory distress. He has no decreased breath sounds. He has wheezes (global, expiratory ). He exhibits no tenderness.  Patient satting at 100% on RA. No increased work of breathing. No accessory muscle use. Patient is sitting upright, speaking in full sentences without difficulty   Abdominal: Soft. Bowel sounds are normal. There is no tenderness. There is no rebound and no guarding.  Musculoskeletal: He exhibits no edema.  Lymphadenopathy:    He has no cervical adenopathy.  Neurological: He is alert.  Speech clear. Follows commands. No facial droop. PERRLA. EOM grossly intact. CN III-XII grossly intact. Grossly moves all extremities 4 without ataxia. Able and appropriate strength for age to upper and lower extremities bilaterally.   Skin: Skin is warm and dry. No rash noted. He is not diaphoretic.  Psychiatric: He has a normal mood and affect.  Nursing note and vitals reviewed.    ED Treatments / Results  Labs (all labs ordered are listed, but only abnormal results are displayed) Labs Reviewed  RAPID URINE DRUG SCREEN, HOSP PERFORMED - Abnormal; Notable for the following components:      Result Value   Tetrahydrocannabinol POSITIVE (*)    All other components within normal limits  ACETAMINOPHEN LEVEL - Abnormal; Notable for the following components:   Acetaminophen (Tylenol), Serum <10 (*)    All other components within normal limits  COMPREHENSIVE  METABOLIC PANEL - Abnormal; Notable for the following components:   Glucose, Bld 117 (*)    All other components within normal limits  ETHANOL  SALICYLATE LEVEL  CBC WITH DIFFERENTIAL/PLATELET    EKG EKG Interpretation  Date/Time:  Wednesday January 23 2018 15:46:03 EDT Ventricular Rate:  95 PR Interval:  126 QRS Duration: 100 QT Interval:  356 QTC Calculation: 447 R Axis:   80 Text Interpretation:  Normal sinus rhythm Left ventricular hypertrophy Early repolarization Abnormal ECG No significant change since last tracing Confirmed by Melene Plan [phone]) on 01/23/2018 3:51:48 PM   Radiology Dg Chest 2 View  Result Date: 01/23/2018 CLINICAL DATA:  Wheezing EXAM: CHEST - 2 VIEW COMPARISON:  07/23/2016 FINDINGS: The heart size and mediastinal contours are within normal  limits. Both lungs are clear. The visualized skeletal structures are unremarkable. IMPRESSION: No active cardiopulmonary disease. Electronically Signed   By: Marlan Palauharles  Clark M.D.   On: 01/23/2018 15:35    Procedures Procedures (including critical care time)  Medications Ordered in ED Medications  albuterol (PROVENTIL HFA;VENTOLIN HFA) 108 (90 Base) MCG/ACT inhaler 2 puff (has no administration in time range)  hydrOXYzine (ATARAX/VISTARIL) tablet 25 mg (has no administration in time range)  paliperidone (INVEGA) 24 hr tablet 6 mg (has no administration in time range)  traZODone (DESYREL) tablet 100 mg (has no administration in time range)  benztropine (COGENTIN) tablet 1 mg (has no administration in time range)  predniSONE (DELTASONE) tablet 40 mg (has no administration in time range)  predniSONE (DELTASONE) tablet 60 mg (60 mg Oral Given 01/23/18 1539)  ipratropium-albuterol (DUONEB) 0.5-2.5 (3) MG/3ML nebulizer solution 3 mL (3 mLs Nebulization Given 01/23/18 1539)     Initial Impression / Assessment and Plan / ED Course  I have reviewed the triage vital signs and the nursing notes.  Pertinent labs & imaging results  that were available during my care of the patient were reviewed by me and considered in my medical decision making (see chart for details).     27 year old male with history of schizophrenia, psychosis, cannabis abuse presenting the emergency department today for medical clearance.  Patient notes that he has been having visual and auditory hallucinations as well as increased anxiety since smoking marijuana this morning.  Patient has not been taking his home medication.  He notes that he has been having frequent episodes of visual and auditory hallucinations.  He denies any SI or HI.  He has not followed up with his outpatient therapist in some time.  He is not established psychiatrist, psychologist or PCP.  She denies any alcohol use.  He notes over the last 2 days he has had some wheezing.  No chest pain.  No other complaints at this time.  Patient vital signs are reassuring on presentation.  Patient does have expiratory wheezing on exam.  No evidence of respiratory distress.  He is satting at 100% on room air.  Chest x-ray without evidence of cardiopulmonary disease.  Patient's symptoms improved with albuterol treatment.  Lungs are clear to auscultation bilaterally.  He states his breathing is back at baseline.  Patient given prednisone in the department.  Patient's labs are reassuring.  Patient is medically cleared.  Home medication reordered. 5 day steroid burst order placed. Albuterol prn placed. Patient placed in psych hold.  TTS consult placed.   Final Clinical Impressions(s) / ED Diagnoses   Final diagnoses:  Wheezing  Encounter for medical clearance for patient hold    ED Discharge Orders    None       Jacinto HalimMaczis, Kayson Bullis M, PA-C 01/23/18 1707    Melene PlanFloyd, Dan, DO 01/24/18 [id number]
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
## 2018-01-23 NOTE — ED Notes (Signed)
Pt A&O x 3, no distress noted, calm & cooperative, sleeping at present.  Monitoring for safety, Q 15 min checks in effect. 

## 2018-01-23 NOTE — ED Notes (Signed)
Bed: WA26 Expected date:  Expected time:  Means of arrival:  Comments: 

## 2018-01-24 DIAGNOSIS — F209 Schizophrenia, unspecified: Secondary | ICD-10-CM | POA: Diagnosis not present

## 2018-01-24 DIAGNOSIS — F17210 Nicotine dependence, cigarettes, uncomplicated: Secondary | ICD-10-CM | POA: Diagnosis not present

## 2018-01-24 DIAGNOSIS — F1220 Cannabis dependence, uncomplicated: Secondary | ICD-10-CM | POA: Diagnosis not present

## 2018-01-24 MED ORDER — PREDNISONE 20 MG PO TABS: 40.0000 mg | ORAL_TABLET | Freq: Every day | ORAL | 0 refills | Status: DC

## 2018-01-24 NOTE — Discharge Instructions (Signed)
For your mental health needs, you are advised to follow up with [facility].  New and returning patients are seen at their walk-in clinic.  Walk-in hours are Monday - Friday from 8:00 am - 3:00 pm.  Walk-in patients are seen on a first come, first served basis.  Try to arrive as early as possible for he best chance of being seen the same day: ° °     [facility] °     [address] °     [place], [place] [address] °     [phone] °

## 2018-01-24 NOTE — ED Notes (Signed)
Pt discharged , ambulatory, reviewed dc instructions and prescriptions. Pt given bus pass. 1 bag of belongings returned to patient.

## 2018-01-24 NOTE — [facility] Suicide Risk Assessment (Signed)
Suicide Risk Assessment  Discharge Assessment   [facility]   Principal Problem: Schizophrenia [facility](HCC) Discharge Diagnoses:  Patient Active Problem List   Diagnosis Date Noted  . Schizophrenia (HCC) [F20.9] 05/12/2016    Priority: High  . Cannabis use disorder, moderate, dependence (HCC) [F12.20] 05/10/2016    Priority: High  . Psychosis (HCC) [F29]     Total Time spent with patient: 45 minutes   Musculoskeletal: Strength & Muscle Tone: within normal limits Gait & Station: normal Patient leans: N/A  Psychiatric Specialty Exam: Physical Exam  Constitutional: He is oriented to person, place, and time. He appears well-developed and well-nourished.  HENT:  Head: Normocephalic.  Neck: Normal range of motion.  Respiratory: Effort normal.  Musculoskeletal: Normal range of motion.  Neurological: He is alert and oriented to person, place, and time.  Psychiatric: He has a normal mood and affect. His speech is normal and behavior is normal. Judgment and thought content normal. Cognition and memory are normal.    Review of Systems  Psychiatric/Behavioral: Positive for substance abuse.  All other systems reviewed and are negative.   Blood pressure (!) 139/57, pulse 98, temperature (!) 97.5 F (36.4 C), resp. rate 20, height 5\' 5"  (1.651 m), weight 74.8 kg (165 lb), SpO2 99 %.Body mass index is 27.46 kg/m.  General Appearance: Casual  Eye Contact:  Good  Speech:  Normal Rate  Volume:  Normal  Mood:  Euthymic  Affect:  Congruent  Thought Process:  Coherent and Descriptions of Associations: Intact  Orientation:  Full (Time, Place, and Person)  Thought Content:  WDL and Logical  Suicidal Thoughts:  No  Homicidal Thoughts:  No  Memory:  Immediate;   Good Recent;   Good Remote;   Good  Judgement:  Fair  Insight:  Fair  Psychomotor Activity:  Normal  Concentration:  Concentration: Good and Attention Span: Good  Recall:  Good  Fund of Knowledge:  Fair   Language:  Good  Akathisia:  No  Handed:  Right  AIMS (if indicated):     Assets:  Leisure Time Physical Health Resilience  ADL's:  Intact  Cognition:  WNL  Sleep:      Mental Status Per Nursing Assessment::   On Admission:   bizarre behaviors  Demographic Factors:  Male  Loss Factors: NA  Historical Factors: NA  Risk Reduction Factors:   Sense of responsibility to family, Living with another person, especially a relative and Positive social support  Continued Clinical Symptoms:  None  Cognitive Features That Contribute To Risk:  None    Suicide Risk:  Minimal: No identifiable suicidal ideation.  Patients presenting with no risk factors but with morbid ruminations; may be classified as minimal risk based on the severity of the depressive symptoms    Plan Of Care/Follow-up recommendations:  Activity:  as tolerated Diet:  heart healthy diet  LORD, JAMISON, NP 01/24/2018, 3:11 PM

## 2018-01-24 NOTE — Clinical Social Work Note (Cosign Needed Addendum)
Clinical Social Work Assessment  Patient Details  Name: Adrian GivensMarlo Warner MRN: [id number] Date of Birth: 12/10/1990  Date of referral:  01/24/18               Reason for consult:  Housing Concerns/Homelessness                Permission sought to share information with:    Permission granted to share information::     Name::        Agency::     Relationship::     Contact Information:     Housing/Transportation Living arrangements for the past 2 months:  Homeless Source of Information:  Patient Patient Interpreter Needed:  None Criminal Activity/Legal Involvement Pertinent to Current Situation/Hospitalization:    Significant Relationships:  None Lives with:  Self Do you feel safe going back to the place where you live?    Need for family participation in patient care:     Care giving concerns:  Patient brought in by EMS due to smoking weed which made his Schizophrenia worse. Patient also expressing concerns of homelessness.   Social Worker assessment / plan:  CSW intern completed assessment for discharge planning. This Clinical research associatewriter asked patient where he was currently living and patient could not answer. 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   Patient continued to reiterate to this writer that he feels paranoid and does not like to be around a great deal of people at one time. CSW intern validated patients current emotions. This Clinical research associatewriter presented patient with a list of shelter resources for safe housing once stable for discharge.   Patient informed this Clinical research associatewriter that he did have a job at [employer], however was fired several weeks ago. Patient is willing to apply to more jobs similar to what he was doing at [employer]. CSW intern assessed for outside supports and the patient informed this Clinical research associatewriter that he currently has nobody to help him.   Transportation: Buss pass and shelter resources were  left with patients nurse once ready for discharge.    Employment status:  Unemployed Health and safety inspectornsurance information:  [place] Recommendations:  Not assessed at this time Information / Referral to community resources:  Shelter  Patient/Family's Response to care:  Patient was appreciative of care being provided by the ED team and observed to be proactive to find a new job.   Patient/Family's Understanding of and Emotional Response to Diagnosis, Current Treatment, and Prognosis:  Patient is understanding of his current diagnosis and treatment interventions at this time.   Emotional Assessment Appearance:  Appears stated age Attitude/Demeanor/Rapport:    Affect (typically observed):  Accepting, Pleasant, Appropriate Orientation:  Oriented to Self, Oriented to Place, Oriented to  Time, Oriented to Situation Alcohol / Substance use:    Psych involvement (Current and /or in the community):  Yes (Comment)  Discharge Needs  Concerns to be addressed:  No discharge needs identified Readmission within the last 30 days:  Yes Current discharge risk:  None Barriers to Discharge:  No Barriers Identified   Ulis RiasKayla R Jayd Forrey, Student-Social Work 01/24/2018, 11:09 AM

## 2018-01-24 NOTE — Consult Note (Addendum)
[place] Psychiatry Consult   Reason for Consult:  Bizarre behaviors Referring Physician:  EDP Patient Identification: [name] MRN:  [id number] Principal Diagnosis: Schizophrenia [facility]) Diagnosis:   Patient Active Problem List   Diagnosis Date Noted  . Schizophrenia ([place]) [F20.9] 05/12/2016    Priority: High  . Cannabis use disorder, moderate, dependence ([place]) [F12.20] 05/10/2016    Priority: High  . Psychosis ([place]) [F29]     Total Time spent with patient: 45 minutes  Subjective:   [name] is a 27 y.o. male patient does not warrant admission.  HPI:  27 yo male who presented to the ED after using cannabis and having paranoia.  Today, he denies suicidal/homicidal ideations, hallucinations, and substance issues besides cannabis.  He lives with his aunt but does not like it there because of "kids running around."  He wants to get a room in a boarding house but is always too late.  Discharged from [facility] on 4/16 and encouraged to follow-up with [facility], stable for discharge.  Past Psychiatric History: Schizophrenia  Risk to Self: Suicidal Ideation: No Suicidal Intent: No Is patient at risk for suicide?: No Suicidal Plan?: No Access to Means: No Intentional Self Injurious Behavior: None Risk to Others: Homicidal Ideation: No Thoughts of Harm to Others: No Current Homicidal Intent: No Current Homicidal Plan: No Access to Homicidal Means: No History of harm to others?: No Assessment of Violence: None Noted Does patient have access to weapons?: No Criminal Charges Pending?: No Does patient have a court date: No Prior Inpatient Therapy: Prior Inpatient Therapy: Yes Prior Therapy Dates: 4/10-4/16/2019 Prior Therapy Facilty/Provider(s): [facility] Reason for Treatment: disorganized thinking; SI Prior Outpatient Therapy: Prior Outpatient Therapy: No Does patient have an ACCT team?: No Does patient have Intensive In-House Services?  : No Does patient have [facility]  services? : Unknown Does patient have P4CC services?: No  Past Medical History:  Past Medical History:  Diagnosis Date  . Depression   . Schizophrenia [facility])     Past Surgical History:  Procedure Laterality Date  . BACK SURGERY     Family History: History reviewed. No pertinent family history. Family Psychiatric  History: none Social History:  Social History   Substance and Sexual Activity  Alcohol Use No     Social History   Substance and Sexual Activity  Drug Use Yes  . Types: Marijuana   Comment: rare    Social History   Socioeconomic History  . Marital status: Single    Spouse name: Not on file  . Number of children: Not on file  . Years of education: Not on file  . Highest education level: Not on file  Occupational History  . Not on file  Social Needs  . Financial resource strain: Not on file  . Food insecurity:    Worry: Not on file    Inability: Not on file  . Transportation needs:    Medical: Not on file    Non-medical: Not on file  Tobacco Use  . Smoking status: Current Some Day Smoker  . Smokeless tobacco: Never Used  Substance and Sexual Activity  . Alcohol use: No  . Drug use: Yes    Types: Marijuana    Comment: rare  . Sexual activity: Yes    Birth control/protection: Condom  Lifestyle  . Physical activity:    Days per week: Not on file    Minutes per session: Not on file  . Stress: Not on file  Relationships  . Social  connections:    Talks on phone: Not on file    Gets together: Not on file    Attends religious service: Not on file    Active member of club or organization: Not on file    Attends meetings of clubs or organizations: Not on file    Relationship status: Not on file  Other Topics Concern  . Not on file  Social History Narrative  . Not on file   Additional Social History: N/A    Allergies:   Allergies  Allergen Reactions  . Shellfish Allergy Hives and Itching    Labs:  Results for orders placed or performed  during the hospital encounter of 01/23/18 (from the past 48 hour(s))  Rapid urine drug screen (hospital performed)     Status: Abnormal   Collection Time: 01/23/18  2:47 PM  Result Value Ref Range   Opiates NONE DETECTED NONE DETECTED   Cocaine NONE DETECTED NONE DETECTED   Benzodiazepines NONE DETECTED NONE DETECTED   Amphetamines NONE DETECTED NONE DETECTED   Tetrahydrocannabinol POSITIVE (A) NONE DETECTED   Barbiturates NONE DETECTED NONE DETECTED    Comment: (NOTE) DRUG SCREEN FOR MEDICAL PURPOSES ONLY.  IF CONFIRMATION IS NEEDED FOR ANY PURPOSE, NOTIFY LAB WITHIN 5 DAYS. LOWEST DETECTABLE LIMITS FOR URINE DRUG SCREEN Drug Class                     Cutoff (ng/mL) Amphetamine and metabolites    1000 Barbiturate and metabolites    200 Benzodiazepine                 540 Tricyclics and metabolites     300 Opiates and metabolites        300 Cocaine and metabolites        300 THC                            50 Performed at [facility], [place] [address] [place], [place] [address]   Acetaminophen level     Status: Abnormal   Collection Time: 01/23/18  3:40 PM  Result Value Ref Range   Acetaminophen (Tylenol), Serum <10 (L) 10 - 30 ug/mL    Comment:        THERAPEUTIC CONCENTRATIONS VARY SIGNIFICANTLY. A RANGE OF 10-30 ug/mL MAY BE AN EFFECTIVE CONCENTRATION FOR MANY PATIENTS. HOWEVER, SOME ARE BEST TREATED AT CONCENTRATIONS OUTSIDE THIS RANGE. ACETAMINOPHEN CONCENTRATIONS >150 ug/mL AT 4 HOURS AFTER INGESTION AND >50 ug/mL AT 12 HOURS AFTER INGESTION ARE OFTEN ASSOCIATED WITH TOXIC REACTIONS. Performed at [facility], [place] [name] [place], [place] [address]   Comprehensive metabolic panel     Status: Abnormal   Collection Time: 01/23/18  3:40 PM  Result Value Ref Range   Sodium 140 135 - 145 mmol/L   Potassium 4.2 3.5 - 5.1 mmol/L   Chloride 101 101 - 111 mmol/L   CO2 25 22 - 32 mmol/L   Glucose, Bld 117 (H) 65 - 99 mg/dL    BUN 15 6 - 20 mg/dL   Creatinine, Ser 0.99 0.61 - 1.24 mg/dL   Calcium 10.1 8.9 - 10.3 mg/dL   Total Protein 7.9 6.5 - 8.1 g/dL   Albumin 4.7 3.5 - 5.0 g/dL   AST 33 15 - 41 U/L   ALT 22 17 - 63 U/L   Alkaline Phosphatase 46 38 - 126 U/L   Total Bilirubin 0.7 0.3 - 1.2  mg/dL   GFR calc non Af Amer >60 >60 mL/min   GFR calc Af Amer >60 >60 mL/min    Comment: (NOTE) The eGFR has been calculated using the CKD EPI equation. This calculation has not been validated in all clinical situations. eGFR's persistently <60 mL/min signify possible Chronic Kidney Disease.    Anion gap 14 5 - 15    Comment: Performed at [facility], [place] [address] [place], [place] [address]  Ethanol     Status: None   Collection Time: 01/23/18  3:40 PM  Result Value Ref Range   Alcohol, Ethyl (B) <10 <10 mg/dL    Comment:        LOWEST DETECTABLE LIMIT FOR SERUM ALCOHOL IS 10 mg/dL FOR MEDICAL PURPOSES ONLY Performed at [facility], [place] [address], [place], [place] [address]   Salicylate level     Status: None   Collection Time: 01/23/18  3:40 PM  Result Value Ref Range   Salicylate Lvl <6.2 2.8 - 30.0 mg/dL    Comment: Performed at [facility], [place] [address], [place], [place] [address]  CBC with Differential     Status: None   Collection Time: 01/23/18  3:40 PM  Result Value Ref Range   WBC 8.0 4.0 - 10.5 K/uL   RBC 5.32 4.22 - 5.81 MIL/uL   Hemoglobin 16.4 13.0 - 17.0 g/dL   HCT 47.9 39.0 - 52.0 %   MCV 90.0 78.0 - 100.0 fL   MCH 30.8 26.0 - 34.0 pg   MCHC 34.2 30.0 - 36.0 g/dL   RDW 13.3 11.5 - 15.5 %   Platelets 203 150 - 400 K/uL   Neutrophils Relative % 60 %   Neutro Abs 4.8 1.7 - 7.7 K/uL   Lymphocytes Relative 28 %   Lymphs Abs 2.2 0.7 - 4.0 K/uL   Monocytes Relative 8 %   Monocytes Absolute 0.6 0.1 - 1.0 K/uL   Eosinophils Relative 4 %   Eosinophils Absolute 0.3 0.0 - 0.7 K/uL   Basophils Relative 0 %   Basophils Absolute  0.0 0.0 - 0.1 K/uL    Comment: Performed at [facility], [place] [address] [place], [place] [address]    Current Facility-Administered Medications  Medication Dose Route Frequency Provider Last Rate Last Dose  . albuterol (PROVENTIL HFA;VENTOLIN HFA) 108 (90 Base) MCG/ACT inhaler 2 puff  2 puff Inhalation Q4H PRN Maczis, Barth Kirks, PA-C      . benztropine (COGENTIN) tablet 1 mg  1 mg Oral BID PRN Maczis, Barth Kirks, PA-C      . hydrOXYzine (ATARAX/VISTARIL) tablet 25 mg  25 mg Oral TID PRN Jillyn Ledger, PA-C   25 mg at 01/23/18 2230  . paliperidone (INVEGA) 24 hr tablet 6 mg  6 mg Oral QHS Jillyn Ledger, PA-C   6 mg at 01/23/18 2230  . predniSONE (DELTASONE) tablet 40 mg  40 mg Oral Q breakfast Jillyn Ledger, PA-C   40 mg at 01/24/18 8768  . traZODone (DESYREL) tablet 100 mg  100 mg Oral QHS PRN Jillyn Ledger, PA-C   100 mg at 01/23/18 2230   Current Outpatient Medications  Medication Sig Dispense Refill  . [START ON 02/21/2018] paliperidone (INVEGA SUSTENNA) 156 MG/ML SUSP injection Inject 1 mL (156 mg total) into the muscle every 30 (thirty) days. For mood control 0.9 mL 0  . albuterol (PROVENTIL HFA;VENTOLIN HFA) 108 (90 Base) MCG/ACT inhaler Inhale 2 puffs  into the lungs every 4 (four) hours as needed for wheezing or shortness of breath (cough).    . benztropine (COGENTIN) 1 MG tablet Take 1 tablet (1 mg total) by mouth 2 (two) times daily as needed (EPS). 60 tablet 0  . hydrOXYzine (ATARAX/VISTARIL) 25 MG tablet Take 1 tablet (25 mg total) by mouth 3 (three) times daily as needed (mild/moderate anxiety). 60 tablet 0  . paliperidone (INVEGA) 6 MG 24 hr tablet Take 1 tablet (6 mg total) by mouth at bedtime. For mood control 15 tablet 0  . traZODone (DESYREL) 100 MG tablet Take 1 tablet (100 mg total) by mouth at bedtime as needed for sleep. 30 tablet 0    Musculoskeletal: Strength & Muscle Tone: within normal limits Gait & Station: normal Patient leans:  N/A  Psychiatric Specialty Exam: Physical Exam  Nursing note and vitals reviewed. Constitutional: He is oriented to person, place, and time. He appears well-developed and well-nourished.  HENT:  Head: Normocephalic.  Neck: Normal range of motion.  Respiratory: Effort normal.  Musculoskeletal: Normal range of motion.  Neurological: He is alert and oriented to person, place, and time.  Psychiatric: He has a normal mood and affect. His speech is normal and behavior is normal. Judgment and thought content normal. Cognition and memory are normal.    Review of Systems  Psychiatric/Behavioral: Positive for substance abuse.  All other systems reviewed and are negative.   Blood pressure (!) 139/57, pulse 98, temperature (!) 97.5 F (36.4 C), resp. rate 20, height 5' 5" (1.651 m), weight 74.8 kg (165 lb), SpO2 99 %.Body mass index is 27.46 kg/m.  General Appearance: Casual  Eye Contact:  Good  Speech:  Normal Rate  Volume:  Normal  Mood:  Euthymic  Affect:  Congruent  Thought Process:  Coherent and Descriptions of Associations: Intact  Orientation:  Full (Time, Place, and Person)  Thought Content:  WDL and Logical  Suicidal Thoughts:  No  Homicidal Thoughts:  No  Memory:  Immediate;   Good Recent;   Good Remote;   Good  Judgement:  Fair  Insight:  Fair  Psychomotor Activity:  Normal  Concentration:  Concentration: Good and Attention Span: Good  Recall:  Good  Fund of Knowledge:  Fair  Language:  Good  Akathisia:  No  Handed:  Right  AIMS (if indicated):   N/A  Assets:  Leisure Time Physical Health Resilience  ADL's:  Intact  Cognition:  WNL  Sleep:   N/A     Treatment Plan Summary: Daily contact with patient to assess and evaluate symptoms and progress in treatment, Medication management and Plan schizophrenia, undifferentiated type:  -Crisis stabilization -Medication management:  Restarted Invega 6 mg daily, Cogentin 1 mg BID PRN for EPS, and Trazodone 100 mg at  bedtime for sleep PRN -Individual counseling  Disposition: No evidence of imminent risk to self or others at present.    Waylan Boga, NP 01/24/2018 11:16 AM   Patient seen face-to-face for psychiatric evaluation, chart reviewed and case discussed with the physician extender and developed treatment plan. Reviewed the information documented and agree with the treatment plan.  Buford Dresser, DO 01/24/18 5:21 PM

## 2018-01-24 NOTE — BH Assessment (Addendum)
[facility]  Per Juanetta BeetsJacqueline Norman, DO, this pt does not require psychiatric hospitalization at this time.  [facility]  This has been included in pt's discharge instructions.  Social worker agrees to speak to pt about area supportive services for the homeless.  Pt's nurse has been notified.  Doylene Canninghomas Shameer Molstad, MA Triage Specialist [phone]

## 2018-01-24 NOTE — ED Notes (Signed)
Pt is resting at this time. Will give medication during rounds

## 2018-01-26 ENCOUNTER — Other Ambulatory Visit: Payer: Self-pay

## 2018-01-26 ENCOUNTER — Emergency Department (HOSPITAL_COMMUNITY)
Admission: EM | Admit: 2018-01-26 | Discharge: 2018-01-26 | Disposition: A | Discharge status: Home / Self Care | Payer: Medicaid Other | Attending: Emergency Medicine | Admitting: Emergency Medicine

## 2018-01-26 ENCOUNTER — Encounter (HOSPITAL_COMMUNITY): Payer: Self-pay

## 2018-01-26 DIAGNOSIS — F17210 Nicotine dependence, cigarettes, uncomplicated: Secondary | ICD-10-CM | POA: Insufficient documentation

## 2018-01-26 DIAGNOSIS — M79601 Pain in right arm: Secondary | ICD-10-CM | POA: Diagnosis present

## 2018-01-26 MED ORDER — ACETAMINOPHEN 325 MG PO TABS
650.0000 mg | ORAL_TABLET | Freq: Once | ORAL | Status: AC
  Administered 2018-01-26: 650 mg via ORAL
  Filled 2018-01-26: qty 2

## 2018-01-26 NOTE — ED Notes (Signed)
Pt reports to RN that he smoked a blunt today, his arm hurts and his vision is blurry.  Pt is wandering around the room and will not sit still. RN instructed pt to sit down for vital signs, pt sat for that then got back up and is wandering around.

## 2018-01-26 NOTE — ED Provider Notes (Signed)
[facility]-EMERGENCY DEPT Provider Note   CSN: [id number] Arrival date & time: 01/26/18  1627     History   Chief Complaint Chief Complaint  Patient presents with  . Arm Pain    HPI [name] is a 27 y.o. male w PMHx schizophrenia, marijuana abuse, presenting to the ED with complaint of right arm pain. Pt states he had a shot in his right arm 3 days ago at the hospital, and it is been sore since.  Pain is worse with movement and palpation.  No medications tried prior to arrival.  Denies swelling of lips or tongue, fever, rash, or other complaints.  Endorses marijuana use today.   The history is provided by the patient.    Past Medical History:  Diagnosis Date  . Depression   . Schizophrenia [facility](HCC)     Patient Active Problem List   Diagnosis Date Noted  . Psychosis (HCC)   . Schizophrenia (HCC) 05/12/2016  . Cannabis use disorder, moderate, dependence ([facility]CC) 05/10/2016    Past Surgical History:  Procedure Laterality Date  . BACK SURGERY          Home Medications    Prior to Admission medications   Medication Sig Start Date End Date Taking? Authorizing Provider  albuterol (PROVENTIL HFA;VENTOLIN HFA) 108 (90 Base) MCG/ACT inhaler Inhale 2 puffs into the lungs every 4 (four) hours as needed for wheezing or shortness of breath (cough). 01/22/18   Armandina StammerNwoko, Agnes I, NP  benztropine (COGENTIN) 1 MG tablet Take 1 tablet (1 mg total) by mouth 2 (two) times daily as needed (EPS). 01/22/18   Armandina StammerNwoko, Agnes I, NP  hydrOXYzine (ATARAX/VISTARIL) 25 MG tablet Take 1 tablet (25 mg total) by mouth 3 (three) times daily as needed (mild/moderate anxiety). 01/22/18   Armandina StammerNwoko, Agnes I, NP  paliperidone (INVEGA SUSTENNA) 156 MG/ML SUSP injection Inject 1 mL (156 mg total) into the muscle every 30 (thirty) days. For mood control 02/21/18   Armandina StammerNwoko, Agnes I, NP  paliperidone (INVEGA) 6 MG 24 hr tablet Take 1 tablet (6 mg total) by mouth at bedtime. For mood control 01/22/18    Armandina StammerNwoko, Agnes I, NP  predniSONE (DELTASONE) 20 MG tablet Take 2 tablets (40 mg total) by mouth daily with breakfast. 01/25/18   Charm RingsLord, Jamison Y, NP  traZODone (DESYREL) 100 MG tablet Take 1 tablet (100 mg total) by mouth at bedtime as needed for sleep. 01/22/18   Sanjuana KavaNwoko, Agnes I, NP    Family History History reviewed. No pertinent family history.  Social History Social History   Tobacco Use  . Smoking status: Current Some Day Smoker  . Smokeless tobacco: Never Used  Substance Use Topics  . Alcohol use: No  . Drug use: Yes    Types: Marijuana    Comment: rare     Allergies   Shellfish allergy   Review of Systems Review of Systems  Constitutional: Negative for fever.  HENT: Negative for facial swelling.   Musculoskeletal: Positive for myalgias.  Skin: Negative for color change and rash.     Physical Exam Updated Vital Signs BP (!) 156/108 (BP Location: Left Arm)   Pulse 92   Temp 98.2 F (36.8 C) (Oral)   Resp 14   SpO2 99%   Physical Exam  Constitutional: He appears well-developed and well-nourished. No distress.  HENT:  Head: Normocephalic and atraumatic.  Mouth/Throat: Oropharynx is clear and moist.  Eyes: Conjunctivae are normal.  Cardiovascular: Normal rate, regular rhythm and intact distal pulses.  Pulmonary/Chest: Effort normal and breath sounds normal. No stridor.  Musculoskeletal:  Right deltoid with tenderness. Skin appears normal. No erythema, warmth, edema, rash. Normal ROM of RUE.   Psychiatric: He has a normal mood and affect. His behavior is normal.  Nursing note and vitals reviewed.    ED Treatments / Results  Labs (all labs ordered are listed, but only abnormal results are displayed) Labs Reviewed - No data to display  EKG None  Radiology No results found.  Procedures Procedures (including critical care time)  Medications Ordered in ED Medications  acetaminophen (TYLENOL) tablet 650 mg (650 mg Oral Given 01/26/18 1742)      Initial Impression / Assessment and Plan / ED Course  I have reviewed the triage vital signs and the nursing notes.  Pertinent labs & imaging results that were available during my care of the patient were reviewed by me and considered in my medical decision making (see chart for details).     Pt with complaint of right arm pain after IM injection 3 days ago. Exam without evidence of local or systemic reaction. No erythema, edema, warmth or rash. Pt is well-appearing, not in distress. Discussed that IM injections can cause soreness for the days following and this is normal. Pain treated with tylenol. Safe for discharge.  Discussed results, findings, treatment and follow up. Patient advised of return precautions. Patient verbalized understanding and agreed with plan.   Final Clinical Impressions(s) / ED Diagnoses   Final diagnoses:  Right arm pain    ED Discharge Orders    None       Eloyce Bultman, [place] N, PA-C 01/26/18 1805    Phillis Haggis, MD 01/26/18 1820

## 2018-01-26 NOTE — ED Triage Notes (Signed)
Pt reports R shoulder pain since an IM shot that he received 3 days ago. He also reports drowsiness. He states that he smoked 1 blunt today PTA. Ambulatory.

## 2018-01-26 NOTE — Discharge Instructions (Signed)
It is normal for your arm to be sore after an injection. You can apply ice for 20 minutes at a time as needed for pain. You can take tylenol every 6 hours as needed for pain.

## 2018-01-26 NOTE — ED Notes (Signed)
Pt handed RN his personal cell phone and told me she wanted to talk to me. RN listened to person on phone speak and assured them we were going to go over discharge information with pt.  The person the pt was speaking to said "He's a mental health pt and he needs things explained to him so he understands. i'm his legal guardian." RN could not confirm that the person on the phone was his true legal guardian. RN could not give any medical information on the phone.  RN reviewed d/c instructions with pt and pt was discharged.

## 2018-01-26 NOTE — ED Notes (Signed)
Bed: WTR6 Expected date:  Expected time:  Means of arrival:  Comments: 27 y/o male complaining of arm pain from IM shot received several days ago

## 2018-01-28 ENCOUNTER — Emergency Department (HOSPITAL_COMMUNITY): Payer: Medicaid Other

## 2018-01-28 ENCOUNTER — Emergency Department (HOSPITAL_COMMUNITY)
Admission: EM | Admit: 2018-01-28 | Discharge: 2018-01-28 | Disposition: A | Discharge status: Home / Self Care | Payer: Medicaid Other | Attending: Emergency Medicine | Admitting: Emergency Medicine

## 2018-01-28 ENCOUNTER — Encounter (HOSPITAL_COMMUNITY): Payer: Self-pay | Admitting: Emergency Medicine

## 2018-01-28 DIAGNOSIS — R062 Wheezing: Secondary | ICD-10-CM | POA: Insufficient documentation

## 2018-01-28 DIAGNOSIS — F17210 Nicotine dependence, cigarettes, uncomplicated: Secondary | ICD-10-CM | POA: Diagnosis not present

## 2018-01-28 DIAGNOSIS — F329 Major depressive disorder, single episode, unspecified: Secondary | ICD-10-CM | POA: Diagnosis not present

## 2018-01-28 DIAGNOSIS — F1220 Cannabis dependence, uncomplicated: Secondary | ICD-10-CM | POA: Insufficient documentation

## 2018-01-28 DIAGNOSIS — R0602 Shortness of breath: Secondary | ICD-10-CM | POA: Diagnosis not present

## 2018-01-28 DIAGNOSIS — F209 Schizophrenia, unspecified: Secondary | ICD-10-CM | POA: Diagnosis not present

## 2018-01-28 LAB — COMPREHENSIVE METABOLIC PANEL
ALT: 14 U/L — ABNORMAL LOW (ref 17–63)
AST: 20 U/L (ref 15–41)
Albumin: 4.2 g/dL (ref 3.5–5.0)
Alkaline Phosphatase: 42 U/L (ref 38–126)
Anion gap: 11 (ref 5–15)
BUN: 14 mg/dL (ref 6–20)
CO2: 27 mmol/L (ref 22–32)
Calcium: 9.3 mg/dL (ref 8.9–10.3)
Chloride: 103 mmol/L (ref 101–111)
Creatinine, Ser: 1.05 mg/dL (ref 0.61–1.24)
GFR calc Af Amer: >60 mL/min (ref >60)
GFR calc non Af Amer: >60 mL/min (ref >60)
Glucose, Bld: 74 mg/dL (ref 65–99)
Potassium: 3.6 mmol/L (ref 3.5–5.1)
Sodium: 141 mmol/L (ref 135–145)
Total Bilirubin: 0.8 mg/dL (ref 0.3–1.2)
Total Protein: 7.2 g/dL (ref 6.5–8.1)

## 2018-01-28 LAB — RAPID URINE DRUG SCREEN, HOSP PERFORMED
Amphetamines: NOT DETECTED
Barbiturates: NOT DETECTED
Benzodiazepines: NOT DETECTED
Cocaine: NOT DETECTED
Opiates: NOT DETECTED
Tetrahydrocannabinol: POSITIVE — AB

## 2018-01-28 LAB — CBC
HCT: 42.5 % (ref 39.0–52.0)
Hemoglobin: 14.5 g/dL (ref 13.0–17.0)
MCH: 30.9 pg (ref 26.0–34.0)
MCHC: 34.1 g/dL (ref 30.0–36.0)
MCV: 90.4 fL (ref 78.0–100.0)
Platelets: 202 10*3/uL (ref 150–400)
RBC: 4.7 MIL/uL (ref 4.22–5.81)
RDW: 12.9 % (ref 11.5–15.5)
WBC: 4.3 10*3/uL (ref 4.0–10.5)

## 2018-01-28 LAB — ETHANOL: Alcohol, Ethyl (B): <10 mg/dL (ref <10)

## 2018-01-28 LAB — SALICYLATE LEVEL: Salicylate Lvl: <7 mg/dL (ref 2.8–30.0)

## 2018-01-28 LAB — ACETAMINOPHEN LEVEL: Acetaminophen (Tylenol), Serum: <10 ug/mL — ABNORMAL LOW (ref 10–30)

## 2018-01-28 MED ORDER — ALBUTEROL SULFATE (2.5 MG/3ML) 0.083% IN NEBU
5.0000 mg | INHALATION_SOLUTION | Freq: Once | RESPIRATORY_TRACT | Status: AC
  Administered 2018-01-28: 5 mg via RESPIRATORY_TRACT
  Filled 2018-01-28: qty 6

## 2018-01-28 MED ORDER — PALIPERIDONE ER 6 MG PO TB24
6.0000 mg | ORAL_TABLET | Freq: Every day | ORAL | Status: DC
  Filled 2018-01-28: qty 1

## 2018-01-28 MED ORDER — LORAZEPAM 1 MG PO TABS
1.0000 mg | ORAL_TABLET | Freq: Once | ORAL | Status: DC
  Filled 2018-01-28: qty 1

## 2018-01-28 MED ORDER — ALBUTEROL SULFATE HFA 108 (90 BASE) MCG/ACT IN AERS
1.0000 | INHALATION_SPRAY | Freq: Once | RESPIRATORY_TRACT | Status: DC
  Filled 2018-01-28: qty 6.7

## 2018-01-28 MED ORDER — BENZTROPINE MESYLATE 1 MG/ML IJ SOLN: 1.0000 mg | Freq: Two times a day (BID) | INTRAMUSCULAR | Status: DC

## 2018-01-28 NOTE — ED Notes (Signed)
Bed: WTR6 Expected date:  Expected time:  Means of arrival:  Comments: 

## 2018-01-28 NOTE — Progress Notes (Signed)
PT used nebulizer treatment for approximately 15 seconds- stated he did not want anymore. RN aware.

## 2018-01-28 NOTE — ED Triage Notes (Signed)
Per [facility] they were at gas station when pt approached them stating he needed help. Pt reports hallucinations that are visual. Denies SI/HI. Used THC and not taking mental health medications.
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
## 2018-01-28 NOTE — Progress Notes (Addendum)
Patient ID: Adrian GivensMarlo Warner, male   DOB: [birth year], 27 y.o.   MRN: [id number]  Pt was seen by TTS and this Clinical research associatewriter. Pt was discussed with the treatment team and Dr Sharma CovertNorman.  Pt would not reveal much information to TTS counselor but did talk with this Clinical research associatewriter and stated he is here because he doesn't feel good. Pt was not able to specify to this writer exactly what was bothering him. Upon further questioning Pt stated he does not have a home and he is [facility] and [facility] since [facility], 4-17, 4-18, and 4-20, and today. [facility] saying his arm hurt from where he got an IM injection three days ago. Pt's UDS positive for THC, BAL negative. Upon investigation it was found that Pt's aunt is his guardian, he lives with her and his name is on the lease. Pt has not followed up with any of the outpatient recommendations given to him and has been instructed to [facility] for [facility] management. Pt's aunt will come to pick him up today and will bring the guardian paperwork with her so it can be scanned into his chart. Pt is psychiatrically clear for discharge.   Patient's chart reviewed and case discussed with the physician extender and developed treatment plan. Reviewed the information documented and agree with the treatment plan.  Adrian BeetsJacqueline Shalane Florendo, DO 01/28/18 5:27 PM
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
## 2018-01-28 NOTE — ED Provider Notes (Signed)
[facility]-EMERGENCY DEPT Provider Note   CSN: [id number] Arrival date & time: 01/28/18  1130     History   Chief Complaint Chief Complaint  Patient presents with  . Hallucinations    HPI Adrian GivensMarlo Warner is a 27 y.o. male.  HPI  Adrian GivensMarlo Kantner is a 27yo male with a history of asthma and schizophrenia who presents to the emergency department due to "need to wash the marijuana out of my system." Patient states that he approached EMS at a gas station stating he needed help. Reports being anxious and having both auditory and visual hallucinations.  When asked specifically about what he sees/hears he states "people and voices." He does not report further information, but states this occurs daily. He has not been taking previously prescribed antipsychotics because he doesn't like the way they make him feel. He does not see a psychiatrist or counselor. Reports using marijuana yesterday. States "I cant live on the street anymore I need help with my mental health." He denies SI/HI. He also reports wheezing and having trouble breathing. Has been diagnosed with asthma in the past, but does not have an inhaler. Denies history of DVT/PE, chest pain, leg swelling/calf pain, recent surgery or immobilization. Denies fever, chills, congestion, sore throat, abdominal pain, nausea/vomiting, dysuria, urinary frequency, lightheadedness, syncope.   Past Medical History:  Diagnosis Date  . Depression   . Schizophrenia [facility](HCC)     Patient Active Problem List   Diagnosis Date Noted  . Psychosis (HCC)   . Schizophrenia (HCC) 05/12/2016  . Cannabis use disorder, moderate, dependence (HCC) 05/10/2016    Past Surgical History:  Procedure Laterality Date  . BACK SURGERY          Home Medications    Prior to Admission medications   Medication Sig Start Date End Date Taking? Authorizing Provider  albuterol (PROVENTIL HFA;VENTOLIN HFA) 108 (90 Base) MCG/ACT inhaler Inhale 2 puffs into  the lungs every 4 (four) hours as needed for wheezing or shortness of breath (cough). Patient not taking: Reported on 01/28/2018 01/22/18   Armandina StammerNwoko, Agnes I, NP  benztropine (COGENTIN) 1 MG tablet Take 1 tablet (1 mg total) by mouth 2 (two) times daily as needed (EPS). Patient not taking: Reported on 01/28/2018 01/22/18   Armandina StammerNwoko, Agnes I, NP  hydrOXYzine (ATARAX/VISTARIL) 25 MG tablet Take 1 tablet (25 mg total) by mouth 3 (three) times daily as needed (mild/moderate anxiety). Patient not taking: Reported on 01/28/2018 01/22/18   Armandina StammerNwoko, Agnes I, NP  paliperidone (INVEGA SUSTENNA) 156 MG/ML SUSP injection Inject 1 mL (156 mg total) into the muscle every 30 (thirty) days. For mood control Patient not taking: Reported on 01/28/2018 02/21/18   Armandina StammerNwoko, Agnes I, NP  paliperidone (INVEGA) 6 MG 24 hr tablet Take 1 tablet (6 mg total) by mouth at bedtime. For mood control Patient not taking: Reported on 01/28/2018 01/22/18   Armandina StammerNwoko, Agnes I, NP  predniSONE (DELTASONE) 20 MG tablet Take 2 tablets (40 mg total) by mouth daily with breakfast. Patient not taking: Reported on 01/28/2018 01/25/18   Charm RingsLord, Jamison Y, NP  traZODone (DESYREL) 100 MG tablet Take 1 tablet (100 mg total) by mouth at bedtime as needed for sleep. Patient not taking: Reported on 01/28/2018 01/22/18   Armandina StammerNwoko, Agnes I, NP    Family History No family history on file.  Social History Social History   Tobacco Use  . Smoking status: Current Some Day Smoker  . Smokeless tobacco: Never Used  Substance Use Topics  .  Alcohol use: No  . Drug use: Yes    Types: Marijuana    Comment: rare     Allergies   Shellfish allergy   Review of Systems Review of Systems  Constitutional: Negative for chills and fever.  HENT: Negative for congestion, ear pain and sore throat.   Eyes: Negative for visual disturbance.  Respiratory: Positive for shortness of breath and wheezing. Negative for cough and stridor.   Cardiovascular: Negative for chest pain,  palpitations and leg swelling.  Gastrointestinal: Negative for abdominal pain, nausea and vomiting.  Genitourinary: Negative for difficulty urinating and dysuria.  Musculoskeletal: Negative for gait problem.  Skin: Negative for wound.  Neurological: Negative for syncope, speech difficulty, light-headedness and headaches.  Psychiatric/Behavioral: Positive for hallucinations (visual and auditory). Negative for suicidal ideas.     Physical Exam Updated Vital Signs BP 127/77 (BP Location: Left Arm)   Pulse 84   Temp 98.8 F (37.1 C) (Oral)   Resp 18   Ht 5\' 5"  (1.651 m)   Wt 74.8 kg (165 lb)   SpO2 98%   BMI 27.46 kg/m   Physical Exam  Constitutional: He appears well-developed and well-nourished. No distress.  HENT:  Head: Normocephalic and atraumatic.  Mouth/Throat: Oropharynx is clear and moist. No oropharyngeal exudate.  Eyes: Pupils are equal, round, and reactive to light. Conjunctivae are normal. Right eye exhibits no discharge. Left eye exhibits no discharge.  Neck: Normal range of motion. Neck supple.  Cardiovascular: Normal rate, regular rhythm and intact distal pulses. Exam reveals no friction rub.  No murmur heard. Pulmonary/Chest: Effort normal. No respiratory distress.  No respiratory distress.  Speaking in full sentences.  Bilateral inspiratory and expiratory wheezing in lung bases.  No stridor, rales or rhonchi.  Abdominal: Soft. Bowel sounds are normal. There is no tenderness.  Musculoskeletal:  No calf swelling or tenderness.   Neurological: He is alert. Coordination normal.  Skin: Skin is warm and dry. Capillary refill takes less than 2 seconds. He is not diaphoretic.  Psychiatric:  Alert and oriented x3.  He appears poorly kempt.  Flat affect.  Endorses visual and auditory hallucinations.  Denies suicidal or homicidal ideation.  Nursing note and vitals reviewed.    ED Treatments / Results  Labs (all labs ordered are listed, but only abnormal results are  displayed) Labs Reviewed  COMPREHENSIVE METABOLIC PANEL - Abnormal; Notable for the following components:      Result Value   ALT 14 (*)    All other components within normal limits  ACETAMINOPHEN LEVEL - Abnormal; Notable for the following components:   Acetaminophen (Tylenol), Serum <10 (*)    All other components within normal limits  RAPID URINE DRUG SCREEN, HOSP PERFORMED - Abnormal; Notable for the following components:   Tetrahydrocannabinol POSITIVE (*)    All other components within normal limits  ETHANOL  SALICYLATE LEVEL  CBC    EKG None  Radiology No results found.  Procedures Procedures (including critical care time)  Medications Ordered in ED Medications  LORazepam (ATIVAN) tablet 1 mg (has no administration in time range)  benztropine mesylate (COGENTIN) injection 1 mg (has no administration in time range)  paliperidone (INVEGA) 24 hr tablet 6 mg (has no administration in time range)  albuterol (PROVENTIL) (2.5 MG/3ML) 0.083% nebulizer solution 5 mg (5 mg Nebulization Given 01/28/18 1420)     Initial Impression / Assessment and Plan / ED Course  I have reviewed the triage vital signs and the nursing notes.  Pertinent labs &  imaging results that were available during my care of the patient were reviewed by me and considered in my medical decision making (see chart for details).    Patient with a history of schizophrenia, not currently taking medications presents with feeling anxious and having auditory and visual hallucinations. Reports using marijuana yesterday and wanting to "wash the drugs" out of his system.   He also reports shortness of breath and wheezing.  He is a known asthmatic and has not been using his albuterol inhaler.  On exam no respiratory distress, although bilateral expiratory wheezing and lung bases.  Patient received 1 albuterol nebulizer treatment and on recheck lungs CTA.   Labs reviewed. CBC and CMP WNL. UDS positive for THC.  Acetaminophen, salicylate and ethanol negative. CXR without acute abnormality.   Medically cleared, awaiting TTS consult. Patient assessed by NP Elta Guadeloupe who reports that patient is psychiatrically clear for discharge.  He has been given instructions to follow-up with [facility] for medication management.  This information was related to his legal guardian (aunt) who agrees with plan.  Patient also discharged with albuterol inhaler for PRN use.  Final Clinical Impressions(s) / ED Diagnoses   Final diagnoses:  Cannabis use disorder, moderate, dependence [facility])    ED Discharge Orders    None       Lawrence Marseilles 01/28/18 1559    Lorre Nick, MD 01/29/18 [phone]

## 2018-01-28 NOTE — ED Notes (Signed)
Call brother [phone] at 1415, to inform him pt was in hospital. He noted that he would inform the aunt he is here.

## 2018-01-28 NOTE — ED Notes (Signed)
Educated aunt on medication and appointments. Pt has all paperwork and property. Staff will continue to monitor and meet needs.

## 2018-01-28 NOTE — ED Notes (Signed)
Patient denies pain and is resting comfortably.  

## 2018-01-28 NOTE — BH Assessment (Signed)
[facility] Assessment Progress Note  Case was staffed with Parks FNP who recommended patient be observed and monitored for safety. Patient will be seen by psychiatry in the a.m.     

## 2018-01-28 NOTE — ED Notes (Signed)
Bed: WBH43 Expected date:  Expected time:  Means of arrival:  Comments: 

## 2018-01-30 ENCOUNTER — Encounter (HOSPITAL_COMMUNITY): Payer: Self-pay | Admitting: Emergency Medicine

## 2018-01-30 ENCOUNTER — Other Ambulatory Visit: Payer: Self-pay

## 2018-01-30 ENCOUNTER — Emergency Department (HOSPITAL_COMMUNITY)
Admission: EM | Admit: 2018-01-30 | Discharge: 2018-01-31 | Disposition: A | Discharge status: Home / Self Care | Payer: Medicaid Other | Attending: Emergency Medicine | Admitting: Emergency Medicine

## 2018-01-30 DIAGNOSIS — R0981 Nasal congestion: Secondary | ICD-10-CM

## 2018-01-30 DIAGNOSIS — F17200 Nicotine dependence, unspecified, uncomplicated: Secondary | ICD-10-CM | POA: Insufficient documentation

## 2018-01-30 DIAGNOSIS — J302 Other seasonal allergic rhinitis: Secondary | ICD-10-CM | POA: Insufficient documentation

## 2018-01-30 NOTE — ED Triage Notes (Signed)
Pt BIB [facility] for nasal congestion. Denies other symptoms at this time.

## 2018-01-30 NOTE — ED Notes (Signed)
Spoke with patient's mother with his permission. Provided updates.

## 2018-01-31 MED ORDER — CETIRIZINE-PSEUDOEPHEDRINE ER 5-120 MG PO TB12: 1.0000 | ORAL_TABLET | Freq: Every day | ORAL | 0 refills | Status: DC

## 2018-01-31 NOTE — Discharge Instructions (Addendum)
Take the medicine prescribed. °

## 2018-01-31 NOTE — ED Notes (Signed)
Dewitt Hoeseresa Hypes wants someone to call her and let her know how Mr Gala Lewandowskyorain is doing. [place] [phone].

## 2018-02-01 ENCOUNTER — Encounter (HOSPITAL_COMMUNITY): Payer: Self-pay | Admitting: Emergency Medicine

## 2018-02-01 ENCOUNTER — Emergency Department (HOSPITAL_COMMUNITY)
Admission: EM | Admit: 2018-02-01 | Discharge: 2018-02-01 | Discharge status: Against Medical Advice | Payer: Medicaid Other | Attending: Emergency Medicine | Admitting: Emergency Medicine

## 2018-02-01 ENCOUNTER — Emergency Department (HOSPITAL_COMMUNITY)
Admission: EM | Admit: 2018-02-01 | Discharge: 2018-02-01 | Discharge status: Against Medical Advice | Payer: Medicaid Other

## 2018-02-01 ENCOUNTER — Other Ambulatory Visit: Payer: Self-pay

## 2018-02-01 DIAGNOSIS — Z5320 Procedure and treatment not carried out because of patient's decision for unspecified reasons: Secondary | ICD-10-CM | POA: Insufficient documentation

## 2018-02-01 DIAGNOSIS — R0602 Shortness of breath: Secondary | ICD-10-CM | POA: Diagnosis present

## 2018-02-01 DIAGNOSIS — R0981 Nasal congestion: Secondary | ICD-10-CM | POA: Insufficient documentation

## 2018-02-01 DIAGNOSIS — J301 Allergic rhinitis due to pollen: Secondary | ICD-10-CM | POA: Diagnosis not present

## 2018-02-01 DIAGNOSIS — J302 Other seasonal allergic rhinitis: Secondary | ICD-10-CM

## 2018-02-01 DIAGNOSIS — F17210 Nicotine dependence, cigarettes, uncomplicated: Secondary | ICD-10-CM | POA: Diagnosis not present

## 2018-02-01 NOTE — ED Notes (Signed)
Pt left AMA °

## 2018-02-01 NOTE — ED Triage Notes (Signed)
Pt arriving [facility] with complaint of shortness of breath. Pt was given a breathing treatment prior to arrival (5mg  Albuterol). Pt having no difficulty breathing at this time and lungs are clear. [facility] or [facility] for same complaint multiple times over the last week. 99% on room air. Pt reports being schizophrenic and not on any medication.

## 2018-02-01 NOTE — ED Provider Notes (Signed)
[facility]-EMERGENCY DEPT Provider Note   CSN: [id number] Arrival date & time: 02/01/18  0346     History   Chief Complaint Chief Complaint  Patient presents with  . Shortness of Breath    HPI [name] is a 27 y.o. male with a hx of depression, schizophrenia presents to the Emergency Department complaining of gradual, persistent, progressively worsening nasal congestion onset several days ago.  Pt reports he was walking to the gas station tonight when he began to feel like he couldn't breath.  Pt reports he had no chest pain.  Pt reports he was not wheezing or struggling to breath.  Pt reports he is a daily smoker of cigarettes and marijuana.  Pt reports no hx of asthma.  He states associated sneezing, watery eyes and itchy eyes.  He is not taking any allergy medications at this time.  Pt was given albuterol treatment by EMS which he reports did not really help his nasal congestion.  Pt denies fever, chills, headache, neck pain, chest pain, abd pain, N/V/D, weakness, dizziness, syncope.     The history is provided by the patient, medical records and the EMS personnel. No language interpreter was used.    Past Medical History:  Diagnosis Date  . Depression   . Schizophrenia [facility](HCC)     Patient Active Problem List   Diagnosis Date Noted  . Psychosis (HCC)   . Schizophrenia (HCC) 05/12/2016  . Cannabis use disorder, moderate, dependence (HCC) 05/10/2016    Past Surgical History:  Procedure Laterality Date  . BACK SURGERY          Home Medications    Prior to Admission medications   Medication Sig Start Date End Date Taking? Authorizing Provider  albuterol (PROVENTIL HFA;VENTOLIN HFA) 108 (90 Base) MCG/ACT inhaler Inhale 2 puffs into the lungs every 4 (four) hours as needed for wheezing or shortness of breath (cough). Patient not taking: Reported on 01/28/2018 01/22/18   Armandina StammerNwoko, Agnes I, NP  benztropine (COGENTIN) 1 MG tablet Take 1 tablet (1 mg  total) by mouth 2 (two) times daily as needed (EPS). Patient not taking: Reported on 01/28/2018 01/22/18   Armandina StammerNwoko, Agnes I, NP  cetirizine-pseudoephedrine (ZYRTEC-D) 5-120 MG tablet Take 1 tablet by mouth daily. 01/31/18   Derwood KaplanNanavati, Ankit, MD  hydrOXYzine (ATARAX/VISTARIL) 25 MG tablet Take 1 tablet (25 mg total) by mouth 3 (three) times daily as needed (mild/moderate anxiety). Patient not taking: Reported on 01/28/2018 01/22/18   Armandina StammerNwoko, Agnes I, NP  paliperidone (INVEGA SUSTENNA) 156 MG/ML SUSP injection Inject 1 mL (156 mg total) into the muscle every 30 (thirty) days. For mood control Patient not taking: Reported on 01/28/2018 02/21/18   Armandina StammerNwoko, Agnes I, NP  paliperidone (INVEGA) 6 MG 24 hr tablet Take 1 tablet (6 mg total) by mouth at bedtime. For mood control Patient not taking: Reported on 01/28/2018 01/22/18   Armandina StammerNwoko, Agnes I, NP  predniSONE (DELTASONE) 20 MG tablet Take 2 tablets (40 mg total) by mouth daily with breakfast. Patient not taking: Reported on 01/28/2018 01/25/18   Charm RingsLord, Jamison Y, NP  traZODone (DESYREL) 100 MG tablet Take 1 tablet (100 mg total) by mouth at bedtime as needed for sleep. Patient not taking: Reported on 01/28/2018 01/22/18   Armandina StammerNwoko, Agnes I, NP    Family History No family history on file.  Social History Social History   Tobacco Use  . Smoking status: Current Some Day Smoker  . Smokeless tobacco: Never Used  Substance Use Topics  .  Alcohol use: No  . Drug use: Yes    Types: Marijuana    Comment: rare     Allergies   Shellfish allergy   Review of Systems Review of Systems  Constitutional: Negative for appetite change, diaphoresis, fatigue, fever and unexpected weight change.  HENT: Positive for congestion, rhinorrhea and sinus pressure. Negative for mouth sores.   Eyes: Positive for discharge ( clear) and itching. Negative for visual disturbance.  Respiratory: Positive for shortness of breath. Negative for cough, chest tightness and wheezing.     Cardiovascular: Negative for chest pain.  Gastrointestinal: Negative for abdominal pain, constipation, diarrhea, nausea and vomiting.  Endocrine: Negative for polydipsia, polyphagia and polyuria.  Genitourinary: Negative for dysuria, frequency, hematuria and urgency.  Musculoskeletal: Negative for back pain and neck stiffness.  Skin: Negative for rash.  Allergic/Immunologic: Negative for immunocompromised state.  Neurological: Negative for syncope, light-headedness and headaches.  Hematological: Does not bruise/bleed easily.  Psychiatric/Behavioral: Negative for sleep disturbance. The patient is not nervous/anxious.      Physical Exam Updated Vital Signs BP 128/76   Pulse 81   Temp 97.8 F (36.6 C)   Resp 19   Ht 5\' 5"  (1.651 m)   Wt 74.8 kg (165 lb)   SpO2 97%   BMI 27.46 kg/m   Physical Exam  Constitutional: He appears well-developed and well-nourished. No distress.  HENT:  Head: Normocephalic and atraumatic.  Right Ear: Tympanic membrane, external ear and ear canal normal.  Left Ear: Tympanic membrane, external ear and ear canal normal.  Nose: Mucosal edema and rhinorrhea present. No epistaxis. Right sinus exhibits no maxillary sinus tenderness and no frontal sinus tenderness. Left sinus exhibits no maxillary sinus tenderness and no frontal sinus tenderness.  Mouth/Throat: Uvula is midline and mucous membranes are normal. Mucous membranes are not pale and not cyanotic. No oropharyngeal exudate, posterior oropharyngeal edema, posterior oropharyngeal erythema or tonsillar abscesses.  Eyes: Pupils are equal, round, and reactive to light. Right eye exhibits discharge ( clear). Right eye exhibits no chemosis and no exudate. Left eye exhibits discharge ( clear). Left eye exhibits no chemosis and no exudate. Right conjunctiva is injected ( mild). Left conjunctiva is injected (mild).  Neck: Normal range of motion and full passive range of motion without pain.  Cardiovascular: Normal  rate and intact distal pulses.  Pulmonary/Chest: Effort normal and breath sounds normal. No stridor.  Clear and equal breath sounds without focal wheezes, rhonchi, rales  Abdominal: Soft. There is no tenderness.  Musculoskeletal: Normal range of motion.  Lymphadenopathy:    He has no cervical adenopathy.  Neurological: He is alert.  Skin: Skin is warm and dry. No rash noted. He is not diaphoretic.  Psychiatric: He has a normal mood and affect.  Nursing note and vitals reviewed.    ED Treatments / Results   Procedures Procedures (including critical care time)  Medications Ordered in ED Medications - No data to display   Initial Impression / Assessment and Plan / ED Course  I have reviewed the triage vital signs and the nursing notes.  Pertinent labs & imaging results that were available during my care of the patient were reviewed by me and considered in my medical decision making (see chart for details).     Patient presents with complaints of shortness of breath.  On my exam he has no hypoxia, no tachypnea, no accessory muscle usage.  Breath sounds are clear and equal.  Patient does have clear rhinorrhea, clear conjunctival discharge and mild anatomical  injection.  Patient reports he never had any pain in his chest and albuterol treatment by EMS did not improve his symptoms.  Suspect patient's shortness of breath was secondary to his nasal congestion.  Discussed the importance of allergy medication including nasal spray.  Patient requests this here in the emergency department.  5:06 AM After discussion with patient, he left prior to my ability to order additional treatments.  He was well-appearing without signs of respiratory distress.  Highly doubt pneumonia, pneumothorax or pulmonary edema.  No clinical evidence of asthma exacerbation.  Suspect symptoms are secondary to seasonal allergies.  Final Clinical Impressions(s) / ED Diagnoses   Final diagnoses:  Nasal congestion    Seasonal allergies  SOB (shortness of breath)    ED Discharge Orders    None       Keyton Bhat, Boyd Kerbs 02/01/18 0507    Dione Booze, MD 02/01/18 [phone]

## 2018-02-01 NOTE — ED Notes (Signed)
ED Provider at bedside. 

## 2018-02-01 NOTE — ED Provider Notes (Signed)
[name] Provider Note   CSN: [id number] Arrival date & time: 01/30/18  2314     History   Chief Complaint Chief Complaint  Patient presents with  . Nasal Congestion    HPI [name] is a 27 y.o. male.  HPI 27 year old male with history of depression and schizophrenia comes in with chief complaint of nasal congestion.  Patient states that he started feeling congestion tonight and decided to come to the ER.  Patient has not taken any medications.  Patient is unaware of any specific allergies.  He also complains of teary and itchy eyes.  Past Medical History:  Diagnosis Date  . Depression   . Schizophrenia [facility](HCC)     Patient Active Problem List   Diagnosis Date Noted  . Psychosis (HCC)   . Schizophrenia (HCC) 05/12/2016  . Cannabis use disorder, moderate, dependence (HCC) 05/10/2016    Past Surgical History:  Procedure Laterality Date  . BACK SURGERY          Home Medications    Prior to Admission medications   Medication Sig Start Date End Date Taking? Authorizing Provider  albuterol (PROVENTIL HFA;VENTOLIN HFA) 108 (90 Base) MCG/ACT inhaler Inhale 2 puffs into the lungs every 4 (four) hours as needed for wheezing or shortness of breath (cough). Patient not taking: Reported on 01/28/2018 01/22/18   Armandina StammerNwoko, Agnes I, NP  benztropine (COGENTIN) 1 MG tablet Take 1 tablet (1 mg total) by mouth 2 (two) times daily as needed (EPS). Patient not taking: Reported on 01/28/2018 01/22/18   Armandina StammerNwoko, Agnes I, NP  cetirizine-pseudoephedrine (ZYRTEC-D) 5-120 MG tablet Take 1 tablet by mouth daily. 01/31/18   Derwood KaplanNanavati, Jaylina Ramdass, MD  hydrOXYzine (ATARAX/VISTARIL) 25 MG tablet Take 1 tablet (25 mg total) by mouth 3 (three) times daily as needed (mild/moderate anxiety). Patient not taking: Reported on 01/28/2018 01/22/18   Armandina StammerNwoko, Agnes I, NP  paliperidone (INVEGA SUSTENNA) 156 MG/ML SUSP injection Inject 1 mL (156 mg total) into the muscle every 30  (thirty) days. For mood control Patient not taking: Reported on 01/28/2018 02/21/18   Armandina StammerNwoko, Agnes I, NP  paliperidone (INVEGA) 6 MG 24 hr tablet Take 1 tablet (6 mg total) by mouth at bedtime. For mood control Patient not taking: Reported on 01/28/2018 01/22/18   Armandina StammerNwoko, Agnes I, NP  predniSONE (DELTASONE) 20 MG tablet Take 2 tablets (40 mg total) by mouth daily with breakfast. Patient not taking: Reported on 01/28/2018 01/25/18   Charm RingsLord, Jamison Y, NP  traZODone (DESYREL) 100 MG tablet Take 1 tablet (100 mg total) by mouth at bedtime as needed for sleep. Patient not taking: Reported on 01/28/2018 01/22/18   Armandina StammerNwoko, Agnes I, NP    Family History No family history on file.  Social History Social History   Tobacco Use  . Smoking status: Current Some Day Smoker  . Smokeless tobacco: Never Used  Substance Use Topics  . Alcohol use: No  . Drug use: Yes    Types: Marijuana    Comment: rare     Allergies   Shellfish allergy   Review of Systems Review of Systems  Constitutional: Positive for activity change.  HENT: Positive for congestion.   Eyes: Positive for itching.  Respiratory: Negative for cough and shortness of breath.      Physical Exam Updated Vital Signs BP 130/82 (BP Location: Right Arm)   Pulse 86   Temp (!) 97.5 F (36.4 C)   Resp 16   SpO2 100%   Physical  Exam  Constitutional: He is oriented to person, place, and time. He appears well-developed.  HENT:  Head: Atraumatic.  Eyes:  Injected conjunctiva bilaterally  Neck: Neck supple.  Cardiovascular: Normal rate.  Pulmonary/Chest: Effort normal.  Lymphadenopathy:    He has no cervical adenopathy.  Neurological: He is alert and oriented to person, place, and time.  Skin: Skin is warm.  Nursing note and vitals reviewed.    ED Treatments / Results  Labs (all labs ordered are listed, but only abnormal results are displayed) Labs Reviewed - No data to display  EKG None  Radiology No results  found.  Procedures Procedures (including critical care time)  Medications Ordered in ED Medications - No data to display   Initial Impression / Assessment and Plan / ED Course  I have reviewed the triage vital signs and the nursing notes.  Pertinent labs & imaging results that were available during my care of the patient were reviewed by me and considered in my medical decision making (see chart for details).    Patient has viral versus allergic URI. Will treat symptoms.   Final Clinical Impressions(s) / ED Diagnoses   Final diagnoses:  Nasal congestion  Seasonal allergies    ED Discharge Orders        Ordered    cetirizine-pseudoephedrine (ZYRTEC-D) 5-120 MG tablet  Daily     01/31/18 0156       Derwood Kaplan, MD 02/01/18 1011

## 2018-02-01 NOTE — ED Notes (Signed)
While taking vitals PT made comment to this writer state "you smell good make me want to grab someone". This Engineer, manufacturingwriter info PT do not make inappropriate statement to staff. PT receive phone call state ride outside. Left AMA

## 2018-02-01 NOTE — ED Notes (Addendum)
No sign of respiratory discomfort. Patient sitting up talking on cell phone

## 2018-02-02 ENCOUNTER — Encounter (HOSPITAL_COMMUNITY): Payer: Self-pay

## 2018-02-02 ENCOUNTER — Emergency Department (HOSPITAL_COMMUNITY)
Admission: EM | Admit: 2018-02-02 | Discharge: 2018-02-02 | Disposition: A | Discharge status: Home / Self Care | Payer: Medicaid Other | Attending: Emergency Medicine | Admitting: Emergency Medicine

## 2018-02-02 DIAGNOSIS — Z5321 Procedure and treatment not carried out due to patient leaving prior to being seen by health care provider: Secondary | ICD-10-CM | POA: Insufficient documentation

## 2018-02-02 DIAGNOSIS — R0602 Shortness of breath: Secondary | ICD-10-CM | POA: Insufficient documentation

## 2018-02-02 NOTE — ED Triage Notes (Signed)
PT arrives to ED via EMS with complaints of difficulty breathing x 2 weeks. PT was seen for same ar [facility] yesterday. PT is schizophrenic and not on any medication. Lung sounds clear.   138/84 Hr 90 spo2 99% rr 18

## 2018-02-02 NOTE — ED Notes (Signed)
Pt approached the desk asking about the wait time. Pt informed that he has been here for 30 mins and there are people ahead of him and we would get to him as quickly as possible. Pt stated that he didn't have a way home and that he wanted a bus pass. Pt informed that we would be able to assist him with that upon discharge. Pt stated that he was ready to be discharged, pt informed that I would not be able to do that for him since he need to be seen by a doctor. Pt stated that he was leaving and left the ER doors. Pt A&O and NAD noted.

## 2018-02-14 ENCOUNTER — Inpatient Hospital Stay (HOSPITAL_COMMUNITY)
Admission: RE | Admit: 2018-02-14 | Discharge: 2018-02-19 | DRG: 885 | Disposition: A | Discharge status: Home / Self Care | Payer: Medicaid Other | Attending: Psychiatry | Admitting: Psychiatry

## 2018-02-14 DIAGNOSIS — F2081 Schizophreniform disorder: Secondary | ICD-10-CM | POA: Diagnosis present

## 2018-02-14 DIAGNOSIS — F1490 Cocaine use, unspecified, uncomplicated: Secondary | ICD-10-CM | POA: Diagnosis not present

## 2018-02-14 DIAGNOSIS — Z79899 Other long term (current) drug therapy: Secondary | ICD-10-CM | POA: Diagnosis not present

## 2018-02-14 DIAGNOSIS — F29 Unspecified psychosis not due to a substance or known physiological condition: Secondary | ICD-10-CM | POA: Diagnosis not present

## 2018-02-14 DIAGNOSIS — G4700 Insomnia, unspecified: Secondary | ICD-10-CM | POA: Diagnosis not present

## 2018-02-14 DIAGNOSIS — F17210 Nicotine dependence, cigarettes, uncomplicated: Secondary | ICD-10-CM | POA: Diagnosis not present

## 2018-02-14 DIAGNOSIS — F2089 Other schizophrenia: Secondary | ICD-10-CM | POA: Diagnosis present

## 2018-02-14 DIAGNOSIS — J45909 Unspecified asthma, uncomplicated: Secondary | ICD-10-CM | POA: Diagnosis not present

## 2018-02-14 DIAGNOSIS — F339 Major depressive disorder, recurrent, unspecified: Secondary | ICD-10-CM | POA: Diagnosis not present

## 2018-02-14 DIAGNOSIS — R451 Restlessness and agitation: Secondary | ICD-10-CM | POA: Diagnosis present

## 2018-02-14 DIAGNOSIS — F17200 Nicotine dependence, unspecified, uncomplicated: Secondary | ICD-10-CM | POA: Diagnosis not present

## 2018-02-14 DIAGNOSIS — F200 Paranoid schizophrenia: Principal | ICD-10-CM | POA: Diagnosis present

## 2018-02-14 DIAGNOSIS — E221 Hyperprolactinemia: Secondary | ICD-10-CM | POA: Diagnosis not present

## 2018-02-14 DIAGNOSIS — F419 Anxiety disorder, unspecified: Secondary | ICD-10-CM | POA: Diagnosis not present

## 2018-02-14 DIAGNOSIS — F6381 Intermittent explosive disorder: Secondary | ICD-10-CM | POA: Diagnosis present

## 2018-02-14 DIAGNOSIS — F329 Major depressive disorder, single episode, unspecified: Secondary | ICD-10-CM | POA: Diagnosis not present

## 2018-02-14 DIAGNOSIS — F1290 Cannabis use, unspecified, uncomplicated: Secondary | ICD-10-CM | POA: Diagnosis not present

## 2018-02-14 DIAGNOSIS — Z91013 Allergy to seafood: Secondary | ICD-10-CM | POA: Diagnosis not present

## 2018-02-14 DIAGNOSIS — G259 Extrapyramidal and movement disorder, unspecified: Secondary | ICD-10-CM | POA: Diagnosis not present

## 2018-02-14 MED ORDER — NICOTINE 21 MG/24HR TD PT24
21.0000 mg | MEDICATED_PATCH | Freq: Every day | TRANSDERMAL | Status: DC
  Filled 2018-02-14 (×5): qty 1

## 2018-02-14 MED ORDER — LORATADINE 10 MG PO TABS
10.0000 mg | ORAL_TABLET | Freq: Every day | ORAL | Status: DC
  Administered 2018-02-15 – 2018-02-19 (×5): 10 mg via ORAL
  Filled 2018-02-14 (×9): qty 1

## 2018-02-14 MED ORDER — TRAZODONE HCL 100 MG PO TABS
100.0000 mg | ORAL_TABLET | Freq: Every evening | ORAL | Status: DC | PRN
  Administered 2018-02-14 – 2018-02-18 (×5): 100 mg via ORAL
  Filled 2018-02-14 (×5): qty 1

## 2018-02-14 MED ORDER — MAGNESIUM HYDROXIDE 400 MG/5ML PO SUSP: 30.0000 mL | Freq: Every day | ORAL | Status: DC | PRN

## 2018-02-14 MED ORDER — ACETAMINOPHEN 325 MG PO TABS: 650.0000 mg | ORAL_TABLET | Freq: Four times a day (QID) | ORAL | Status: DC | PRN

## 2018-02-14 MED ORDER — PALIPERIDONE ER 6 MG PO TB24
6.0000 mg | ORAL_TABLET | Freq: Every day | ORAL | Status: DC
  Administered 2018-02-14 – 2018-02-18 (×5): 6 mg via ORAL
  Filled 2018-02-14 (×8): qty 1

## 2018-02-14 MED ORDER — ALUM & MAG HYDROXIDE-SIMETH 200-200-20 MG/5ML PO SUSP: 30.0000 mL | ORAL | Status: DC | PRN

## 2018-02-14 MED ORDER — BENZTROPINE MESYLATE 1 MG PO TABS: 1.0000 mg | ORAL_TABLET | Freq: Two times a day (BID) | ORAL | Status: DC | PRN

## 2018-02-14 MED ORDER — HYDROXYZINE HCL 25 MG PO TABS
25.0000 mg | ORAL_TABLET | Freq: Three times a day (TID) | ORAL | Status: DC | PRN
  Administered 2018-02-15: 25 mg via ORAL
  Filled 2018-02-14: qty 1

## 2018-02-14 MED ORDER — MONTELUKAST SODIUM 10 MG PO TABS
10.0000 mg | ORAL_TABLET | Freq: Every day | ORAL | Status: DC
  Administered 2018-02-15 – 2018-02-18 (×4): 10 mg via ORAL
  Filled 2018-02-14 (×7): qty 1

## 2018-02-14 MED ORDER — ALBUTEROL SULFATE HFA 108 (90 BASE) MCG/ACT IN AERS
2.0000 | INHALATION_SPRAY | RESPIRATORY_TRACT | Status: DC | PRN
  Administered 2018-02-14: 2 via RESPIRATORY_TRACT
  Filled 2018-02-14: qty 6.7
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
## 2018-02-14 NOTE — H&P (Signed)
Behavioral Health Medical Screening Exam  [name] is an 27 y.o. male.[name] is an 28 y.o. male presents to [facility] voluntarily and alone. Pt states he has hx of scitzophrenia and is hearing voices but unsure what they are saying. Pt states he is paranoid and needs sleep.     Total Time spent with patient: 15 minutes  Psychiatric Specialty Exam: Physical Exam  Nursing note and vitals reviewed. Constitutional: He appears well-developed and well-nourished. No distress.  HENT:  Head: Normocephalic.  Cardiovascular: Normal rate.  Respiratory: No respiratory distress. He has wheezes. He has no rales. He exhibits tenderness.  Skin: Skin is warm and dry. He is not diaphoretic.    Review of Systems  HENT: Positive for congestion.   Respiratory: Positive for cough and wheezing. Negative for hemoptysis, sputum production and shortness of breath.     There were no vitals taken for this visit.There is no height or weight on file to calculate BMI.  General Appearance: Fairly Groomed and Guarded  Eye Contact:  Fair  Speech:  Pressured and Slow  Volume:  Normal  Mood:  Depressed  Affect:  Full Range  Thought Process:  Disorganized and Descriptions of Associations: Circumstantial  Orientation:  Full (Time, Place, and Person)  Thought Content:  Paranoid Ideation  Suicidal Thoughts:  No  Homicidal Thoughts:  No  Memory:  Immediate;   Fair Recent;   Fair  Judgement:  Impaired  Insight:  Lacking  Psychomotor Activity:  Normal  Concentration: Concentration: Fair and Attention Span: Fair  Recall:  [employer] of Knowledge:Fair  Language: Fair  Akathisia:  No  Handed:  Right  AIMS (if indicated):     Assets:  Communication Skills Desire for Improvement Financial Resources/Insurance Leisure Time Physical Health Social Support Vocational/Educational  Sleep:       Musculoskeletal: Strength & Muscle Tone: within normal limits Gait & Station: normal Patient leans: N/A  There  were no vitals taken for this visit.  Recommendations: will admit inpatient to the [facility]  Based on my evaluation the patient does not appear to have an emergency medical condition.  Truman Hayward, FNP 02/14/2018, 10:50 PM

## 2018-02-15 ENCOUNTER — Encounter (HOSPITAL_COMMUNITY): Payer: Self-pay

## 2018-02-15 ENCOUNTER — Other Ambulatory Visit: Payer: Self-pay

## 2018-02-15 DIAGNOSIS — F200 Paranoid schizophrenia: Principal | ICD-10-CM

## 2018-02-15 DIAGNOSIS — G4700 Insomnia, unspecified: Secondary | ICD-10-CM

## 2018-02-15 DIAGNOSIS — F1290 Cannabis use, unspecified, uncomplicated: Secondary | ICD-10-CM

## 2018-02-15 DIAGNOSIS — F17210 Nicotine dependence, cigarettes, uncomplicated: Secondary | ICD-10-CM

## 2018-02-15 DIAGNOSIS — F419 Anxiety disorder, unspecified: Secondary | ICD-10-CM

## 2018-02-15 LAB — URINALYSIS, ROUTINE W REFLEX MICROSCOPIC
Bilirubin Urine: NEGATIVE
Glucose, UA: NEGATIVE mg/dL
Hgb urine dipstick: NEGATIVE
Ketones, ur: NEGATIVE mg/dL
Leukocytes, UA: NEGATIVE
Nitrite: NEGATIVE
Protein, ur: NEGATIVE mg/dL
Specific Gravity, Urine: 1.013 (ref 1.005–1.030)
pH: 8 (ref 5.0–8.0)

## 2018-02-15 LAB — CBC
HCT: 43.5 % (ref 39.0–52.0)
Hemoglobin: 14.7 g/dL (ref 13.0–17.0)
MCH: 30.8 pg (ref 26.0–34.0)
MCHC: 33.8 g/dL (ref 30.0–36.0)
MCV: 91.2 fL (ref 78.0–100.0)
Platelets: 229 10*3/uL (ref 150–400)
RBC: 4.77 MIL/uL (ref 4.22–5.81)
RDW: 13.5 % (ref 11.5–15.5)
WBC: 4.8 10*3/uL (ref 4.0–10.5)

## 2018-02-15 LAB — RAPID URINE DRUG SCREEN, HOSP PERFORMED
Amphetamines: NOT DETECTED
Barbiturates: NOT DETECTED
Benzodiazepines: NOT DETECTED
Cocaine: NOT DETECTED
Opiates: NOT DETECTED
Tetrahydrocannabinol: POSITIVE — AB

## 2018-02-15 LAB — ETHANOL: Alcohol, Ethyl (B): <10 mg/dL (ref <10)

## 2018-02-15 LAB — TSH: TSH: 1.615 u[IU]/mL (ref 0.350–4.500)

## 2018-02-15 MED ORDER — ZIPRASIDONE MESYLATE 20 MG IM SOLR
20.0000 mg | Freq: Two times a day (BID) | INTRAMUSCULAR | Status: DC | PRN
  Administered 2018-02-15: 20 mg via INTRAMUSCULAR
  Filled 2018-02-15: qty 20

## 2018-02-15 MED ORDER — HYDROXYZINE HCL 50 MG PO TABS
50.0000 mg | ORAL_TABLET | Freq: Three times a day (TID) | ORAL | Status: DC | PRN
  Administered 2018-02-16 – 2018-02-18 (×3): 50 mg via ORAL
  Filled 2018-02-15 (×3): qty 1

## 2018-02-15 MED ORDER — LORAZEPAM 1 MG PO TABS
1.0000 mg | ORAL_TABLET | Freq: Four times a day (QID) | ORAL | Status: DC | PRN
  Administered 2018-02-16 – 2018-02-18 (×2): 1 mg via ORAL
  Filled 2018-02-15 (×2): qty 1

## 2018-02-15 MED ORDER — PALIPERIDONE PALMITATE ER 156 MG/ML IM SUSY
156.0000 mg | PREFILLED_SYRINGE | INTRAMUSCULAR | Status: DC
  Administered 2018-02-15: 156 mg via INTRAMUSCULAR

## 2018-02-15 MED ORDER — OLANZAPINE 10 MG PO TBDP
10.0000 mg | ORAL_TABLET | Freq: Three times a day (TID) | ORAL | Status: DC | PRN
Start: 2018-02-15 — End: 2018-02-19
  Administered 2018-02-16 – 2018-02-17 (×2): 10 mg via ORAL
  Filled 2018-02-15 (×2): qty 1

## 2018-02-15 MED ORDER — LORAZEPAM 2 MG/ML IJ SOLN
1.0000 mg | Freq: Four times a day (QID) | INTRAMUSCULAR | Status: DC | PRN
  Administered 2018-02-15: 1 mg via INTRAMUSCULAR
  Filled 2018-02-15: qty 1

## 2018-02-15 NOTE — Plan of Care (Addendum)
  Problem: Safety: Goal: Periods of time without injury will increase Outcome: Progressing   Problem: Nutritional: Goal: Ability to achieve adequate nutritional intake will improve Outcome: Progressing   Problem: Role Relationship: Goal: Ability to communicate needs accurately will improve Outcome: Progressing D: Pt A & O X3. Denies SI, HI, AVH and pain at time of assessment. Presents with blunted affect, irritable, and is anxious on interactions. Appears to be thought blocking / preoccupied and intermittent pacing on unit. Pt observed to be restless / fidgety. Reports racing thoughts "I want to sleep, but I can't, my mind is everywhere, now I just want to sleep, I can't relax". Rates his depression 3/10, anxiety 8/10 and hopelessness 0/10. Pt's goal today is to "go home", I'm alright now, I want to leave". Pt has progressed to being paranoid "I don't want to sleep in that room, I want to move into room 504 and I want someone to watch me when I sleep; please". A: Introduced self to pt. Emotional support and availability provided to pt.  Assigned physician made a ware of pt's concerns. New order received for agitation protocol. Pt updated on changes made to treatment regiment. Scheduled and PRN medications given with verbal education and effects monitored. Safety checks maintained at Q 15 minutes intervals without outburst or self harm gestures.   R: Pt receptive to care. Compliant with medications when offered. Denies adverse drug reactions when assessed this shift. Tolerates all PO intake well. POC continues for safety and mood stability.

## 2018-02-15 NOTE — Progress Notes (Signed)
Adult Psychoeducational Group Note  Date:  02/15/2018 Time:  9:12 PM  Group Topic/Focus:  Wrap-Up Group:   The focus of this group is to help patients review their daily goal of treatment and discuss progress on daily workbooks.  Participation Level:  Active  Participation Quality:  Appropriate  Affect:  Appropriate  Cognitive:  Appropriate  Insight: Appropriate  Engagement in Group:  Engaged  Modes of Intervention:  Discussion  Additional Comments: The patient expressed that he rates today a  7.The patient also said that he does attend from for his situation.  Octavio Manns 02/15/2018, 9:12 PM

## 2018-02-15 NOTE — Tx Team (Signed)
Initial Treatment Plan 02/15/2018 12:32 AM [name] ZOX:[id number]    PATIENT STRESSORS: Health problems Medication change or noncompliance Other: psychosis   PATIENT STRENGTHS: Ability for insight Communication skills General fund of knowledge   PATIENT IDENTIFIED PROBLEMS: Psychosis  "I am schizophrenic and I couldn't breathe"  "interested in treatment"  "Im tired'  "I need a ride back home to [place]"             DISCHARGE CRITERIA:  Ability to meet basic life and health needs Medical problems require only outpatient monitoring Verbal commitment to aftercare and medication compliance  PRELIMINARY DISCHARGE PLAN: Return to previous living arrangement  PATIENT/FAMILY INVOLVEMENT: This treatment plan has been presented to and reviewed with the patient, Dabid Emmer. The patient and family have been given the opportunity to ask questions and make suggestions.  Aurora Mask, RN 02/15/2018, 12:32 AM

## 2018-02-15 NOTE — Progress Notes (Signed)
Patient ID: [name], male   DOB: 03-21-1991, 27 y.o.   MRN: [id number]  27 year old male presents to [facility] accompanied by EMS. Pt reports to writer "I am schizophrenic and I couldn't breathe." Pt reports extensive history of asthma and breathing difficulties. Respirations even and unlabored, audible wheezing heard during assessment. Pt is a poor historian, keeps repeating "I'm tired." Assessment completed with in person assessment and chart review. Pt has difficulty standing for long period of time. Pt reports to Clinical research associate that he is homeless now and would "like a ride back home to [place]." Pt denies using any substances except occasional tobacco use. Denies wanting tobacco cessation interventions while admitted. Pt reports not taking his medication. Auditory hallucinations of voice. Pt currently denies SI/HI and visual hallucinations. Pt verbally agrees to seek staff if SI/HI or VH occurs, AH worsens and to consult with staff before acting on any harmful thoughts. Consents signed, skin/belongings search completed and pt oriented to unit. Pt stable at this time. Pt given the opportunity to express concerns and ask questions. Pt given toiletries and a snack. Will continue to monitor.

## 2018-02-15 NOTE — [facility] Suicide Risk Assessment (Signed)
[facility] INPATIENT:  Family/Significant Other Suicide Prevention Education  Suicide Prevention Education:  Contact Attempts: LaVerne [name] (pt's aunt) [phone] has been identified by the patient as the family member/significant other with whom the patient will be residing, and identified as the person(s) who will aid the patient in the event of a mental health crisis.  With written consent from the patient, two attempts were made to provide suicide prevention education, prior to and/or following the patient's discharge.  We were unsuccessful in providing suicide prevention education.  A suicide education pamphlet was given to the patient to share with family/significant other.  Date and time of first attempt: 10:20AM 02/15/18 Date and time of second attempt: 02/15/18 at 2:05PM (voicemail left requesting call back at her earliest convenience).   Cheney Gosch N Smart LCSW 02/15/2018, 2:06 PM

## 2018-02-15 NOTE — [facility] Suicide Risk Assessment (Signed)
[facility] Admission Suicide Risk Assessment   Nursing information obtained from:    Demographic factors:    Current Mental Status:    Loss Factors:    Historical Factors:    Risk Reduction Factors:     Total Time spent with patient: 1 hour Principal Problem: Schizophrenia, paranoid type (HCC) Diagnosis:   Patient Active Problem List   Diagnosis Date Noted  . 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 Psychosis (HCC) [F29]   . Schizophrenia (HCC) [F20.9] 05/12/2016  . Cannabis use disorder, moderate, dependence (HCC) [F12.20] 05/10/2016   Subjective Data: See H&P for details  Continued Clinical Symptoms:    The "Alcohol Use Disorders Identification Test", Guidelines for Use in Primary Care, Second Edition.  World Science writer [facility]). Score between 0-7:  no or low risk or alcohol related problems. Score between 8-15:  moderate risk of alcohol related problems. Score between 16-19:  high risk of alcohol related problems. Score 20 or above:  warrants further diagnostic evaluation for alcohol dependence and treatment.   CLINICAL FACTORS:   Severe Anxiety and/or Agitation Schizophrenia:   Paranoid or undifferentiated type Currently Psychotic Unstable or Poor Therapeutic Relationship Previous Psychiatric Diagnoses and Treatments Medical Diagnoses and Treatments/Surgeries    Psychiatric Specialty Exam: Physical Exam  Nursing note and vitals reviewed.   ROS- see H&P for details  Blood pressure (!) 129/93, pulse 83, temperature 98.3 F (36.8 C), temperature source Oral, resp. rate 18, height  (1.651 m), weight 74.8 kg (165 lb), SpO2 100 %.Body mass index is 27.46 kg/m.    COGNITIVE FEATURES THAT CONTRIBUTE TO RISK:  Closed-mindedness and Thought constriction (tunnel vision)    SUICIDE RISK:   Minimal: No identifiable suicidal ideation.  Patients presenting with no risk factors but with morbid ruminations; may be classified as minimal risk based on the  severity of the depressive symptoms  PLAN OF CARE: See H&P for details  I certify that inpatient services furnished can reasonably be expected to improve the patient's condition.   Micheal Likens, MD 02/15/2018, 10:46 AM

## 2018-02-15 NOTE — H&P (Signed)
Psychiatric Admission Assessment Adult  Patient Identification: [name] MRN:  [id number] Date of Evaluation:  02/15/2018 Chief Complaint:  schizophrenia Principal Diagnosis: Schizophrenia, paranoid type (HCC) Diagnosis:   Patient Active Problem List   Diagnosis Date Noted  . 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 Psychosis (HCC) [F29]   . Schizophrenia (HCC) [F20.9] 05/12/2016  . Cannabis use disorder, moderate, dependence (HCC) [F12.20] 05/10/2016   History of Present Illness:   [name] is a 27 y/o M with history of schizophrenia who was admitted voluntarily to [facility] after brought in by EMS as per his request, after he reported that he was hearing voices and having paranoia. Upon initial presentation to [facility] he reported paranoia and AH of a voice. Pt has recent relevant history of admission to [facility] last month with discharge on 01/22/18. Since that time pt has had multiple presentations to [facility]-ED for symptoms of paranoia and anxiety in the context of substance use of cannabis and cocaine. He was restarted on oral form of Invega upon admission. He is due for next injection of Invega Sustenna 156IM q30 days on 02/19/18.  Upon initial evaluation, pt is guarded, minimizing of his presenting symptoms, and requesting for discharge. He shares, "I couldn't breathe around all those babies, so I called for the ambulance, and I asked that they bring me here instead of [facility]." Pt relates that he had increased anxiety around the children at his aunt's home where he stays, and so he called EMS to bring him in, but instead he requested to be taken to [facility]. When asked why he asked to be taken to [facility] instead of the ED, pt is unsure. Pt was asked about AH, and he denies having voices and he denies having them recently despite multiple presentations to the ED where had reported AH. He denies current SI/HI/AH/VH. He denies difficulties with his sleep. He denies symptoms of depression. He  denies symptoms of mania, OCD, and PTSD. He denies all recent illicit substance use. Pt had visit to ED within recent weeks where he had discussed use of cannabis, and he was confronted with this recent history but he consistently stated that he has not used cannabis or any other illicit substance in recent weeks.  Discussed with patient about treatment options. He is due for his next injection of [place]  IM q30 days on 02/19/18 and he is in agreement to receive the injection today. He agrees to continue on oral form of Invega while at the hospital. He is in agreement to sign ROI for the treatment team to contact his Aunt with whom he stays for collateral information. Pt was in agreement with the above plan, and he had no further questions, comments, or concerns.   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 Yes.    Has the patient been a risk to self in the past 6 months? Yes.    Has the patient been a risk to self within the distant  past? Yes.    Is the patient a risk to others? No.  Has the patient been a risk to others in the past 6 months? No.  Has the patient been a risk to others within the distant past? No.   Prior Inpatient Therapy: Prior Inpatient Therapy: Yes Prior Therapy Dates: 01/16/18,01/23/18 Prior Therapy Facilty/Provider(s): [facility] Reason for Treatment: MH issues Prior Outpatient Therapy: Prior Outpatient  Therapy: No Does patient have an ACCT team?: No Does patient have Intensive In-House Services?  : No Does patient have [facility] services? : Unknown Does patient have P4CC services?: No  Alcohol Screening: Patient refused Alcohol Screening Tool: Yes Substance Abuse History in the last 12 months:  Yes.   Consequences of Substance Abuse: Medical Consequences:  worsened symptoms of psychosis Previous Psychotropic Medications: Yes  Psychological Evaluations: No  Past Medical History:  Past Medical History:  Diagnosis Date  . Depression   . Schizophrenia [facility])     Past Surgical History:  Procedure Laterality Date  . BACK SURGERY     Family History: History reviewed. No pertinent family history. Family Psychiatric  History: Denies family psychiatric history. Tobacco Screening: Have you used any form of tobacco in the last 30 days? (Cigarettes, Smokeless Tobacco, Cigars, and/or Pipes): Yes Tobacco use, Select all that apply: 4 or less cigarettes per day Are you interested in Tobacco Cessation Medications?: No, patient refused Counseled patient on smoking cessation including recognizing danger situations, developing coping skills and basic information about quitting provided: Refused/Declined practical counseling Social History: Pt was born and raised in [place] and has been living in [place] area for several years. He stays with his aunt. He is not working. He has never been married and has no children. He has no legal history. He denies trauma history.    Social History   Substance and Sexual Activity  Alcohol Use No     Social History   Substance and Sexual Activity  Drug Use Yes  . Types: Marijuana   Comment: rare    Additional Social History: Marital status: Single    Pain Medications: see PTA meds Prescriptions: see PTA meds Over the Counter: see PTA meds History of alcohol / drug use?: Yes Longest period of sobriety (when/how long): Unknown Name of Substance 1:  Cannabis per record review 1 - Age of First Use: Unknown 1 - Amount (size/oz): Unknown 1 - Frequency: Unknown 1 - Duration: Unknown 1 - Last Use / Amount: Unknown                  Allergies:   Allergies  Allergen Reactions  . Shellfish Allergy Hives and Itching   Lab Results:  Results for orders placed or performed during the hospital encounter of 02/14/18 (from the past 48 hour(s))  Ethanol     Status: None   Collection Time: 02/15/18  6:26 AM  Result Value Ref Range   Alcohol, Ethyl (B) <10 <10 mg/dL    Comment:        LOWEST DETECTABLE LIMIT FOR SERUM ALCOHOL IS 10 mg/dL FOR MEDICAL PURPOSES ONLY Performed at [facility], [address] [place], [place] [address]   TSH     Status: None   Collection Time: 02/15/18  6:26 AM  Result Value Ref Range   TSH 1.615 0.350 - 4.500 uIU/mL    Comment: Performed by a 3rd Generation assay with a functional sensitivity of <=0.01 uIU/mL. Performed at [facility], [address] [place], [place] [address]  Blood Alcohol level:  Lab Results  Component Value Date   ETH <10 02/15/2018   ETH <10 01/28/2018    Metabolic Disorder Labs:  Lab Results  Component Value Date   HGBA1C 5.2 05/12/2016   Lab Results  Component Value Date   PROLACTIN 30.5 (H) 05/12/2016   Lab Results  Component Value Date   CHOL 176 05/12/2016   TRIG 312 (H) 05/12/2016   HDL 55 05/12/2016   CHOLHDL 3.2 05/12/2016   VLDL 62 (H) 05/12/2016   LDLCALC 59 05/12/2016    Current Medications: Current Facility-Administered Medications  Medication Dose Route Frequency Provider Last Rate Last Dose  . acetaminophen (TYLENOL) tablet 650 mg  650 mg Oral Q6H PRN Truman Hayward, FNP      . albuterol (PROVENTIL HFA;VENTOLIN HFA) 108 (90 Base) MCG/ACT inhaler 2 puff  2 puff Inhalation Q4H PRN Truman Hayward, FNP   2 puff at 02/14/18 2309  . alum & mag hydroxide-simeth (MAALOX/MYLANTA) 200-200-20 MG/5ML  suspension 30 mL  30 mL Oral Q4H PRN Starkes, Takia S, FNP      . benztropine (COGENTIN) tablet 1 mg  1 mg Oral BID PRN Truman Hayward, FNP      . hydrOXYzine (ATARAX/VISTARIL) tablet 25 mg  25 mg Oral TID PRN Truman Hayward, FNP   25 mg at 02/15/18 0935  . loratadine (CLARITIN) tablet 10 mg  10 mg Oral Daily Truman Hayward, FNP   10 mg at 02/15/18 0910  . magnesium hydroxide (MILK OF MAGNESIA) suspension 30 mL  30 mL Oral Daily PRN Starkes, Takia S, FNP      . montelukast (SINGULAIR) tablet 10 mg  10 mg Oral QHS Starkes, Takia S, FNP      . nicotine (NICODERM CQ - dosed in mg/24 hours) patch 21 mg  21 mg Transdermal Q0600 Starkes, Takia S, FNP      . paliperidone (INVEGA SUSTENNA) injection 156 mg  156 mg Intramuscular Q30 days Jolyne Loa T, MD      . paliperidone (INVEGA) 24 hr tablet 6 mg  6 mg Oral QHS Truman Hayward, FNP   6 mg at 02/14/18 2308  . traZODone (DESYREL) tablet 100 mg  100 mg Oral QHS PRN Truman Hayward, FNP   100 mg at 02/14/18 2308   PTA Medications: Medications Prior to Admission  Medication Sig Dispense Refill Last Dose  . albuterol (PROVENTIL HFA;VENTOLIN HFA) 108 (90 Base) MCG/ACT inhaler Inhale 2 puffs into the lungs every 4 (four) hours as needed for wheezing or shortness of breath (cough). (Patient not taking: Reported on 01/28/2018)   Not Taking at Unknown time  . benztropine (COGENTIN) 1 MG tablet Take 1 tablet (1 mg total) by mouth 2 (two) times daily as needed (EPS). (Patient not taking: Reported on 01/28/2018) 60 tablet 0 Not Taking at Unknown time  . cetirizine-pseudoephedrine (ZYRTEC-D) 5-120 MG tablet Take 1 tablet by mouth daily. 14 tablet 0   . hydrOXYzine (ATARAX/VISTARIL) 25 MG tablet Take 1 tablet (25 mg total) by mouth 3 (three) times daily as needed (mild/moderate anxiety). (Patient not taking: Reported on 01/28/2018) 60 tablet 0 Not Taking at Unknown time  . [START ON 02/21/2018] paliperidone (INVEGA SUSTENNA) 156 MG/ML SUSP injection  Inject 1 mL (156 mg total) into the muscle every 30 (thirty) days. For mood control (Patient not taking: Reported on 01/28/2018) 0.9 mL 0 Not Taking at Unknown time  . paliperidone (INVEGA) 6 MG 24 hr tablet Take 1 tablet (  6 mg total) by mouth at bedtime. For mood control (Patient not taking: Reported on 01/28/2018) 15 tablet 0 Not Taking at Unknown time  . predniSONE (DELTASONE) 20 MG tablet Take 2 tablets (40 mg total) by mouth daily with breakfast. (Patient not taking: Reported on 01/28/2018) 4 tablet 0 Not Taking at Unknown time  . traZODone (DESYREL) 100 MG tablet Take 1 tablet (100 mg total) by mouth at bedtime as needed for sleep. (Patient not taking: Reported on 01/28/2018) 30 tablet 0 Not Taking at Unknown time    Musculoskeletal: Strength & Muscle Tone: within normal limits Gait & Station: normal Patient leans: N/A  Psychiatric Specialty Exam: Physical Exam  Nursing note and vitals reviewed.   Review of Systems  Constitutional: Negative for chills and fever.  Respiratory: Negative for cough and shortness of breath.   Cardiovascular: Negative for chest pain.  Gastrointestinal: Negative for abdominal pain, heartburn, nausea and vomiting.  Psychiatric/Behavioral: Negative for depression, hallucinations and suicidal ideas. The patient is not nervous/anxious and does not have insomnia.     Blood pressure (!) 129/93, pulse 83, temperature 98.3 F (36.8 C), temperature source Oral, resp. rate 18, height  (1.651 m), weight 74.8 kg (165 lb), SpO2 100 %.Body mass index is 27.46 kg/m.  General Appearance: Casual and Fairly Groomed  Eye Contact:  Good  Speech:  Clear and Coherent and Normal Rate  Volume:  Normal  Mood:  Euthymic  Affect:  Appropriate, Congruent and Flat  Thought Process:  Coherent and Goal Directed  Orientation:  Full (Time, Place, and Person)  Thought Content:  Hallucinations: Auditory and Paranoid Ideation  Suicidal Thoughts:  No  Homicidal Thoughts:  No  Memory:   Immediate;   Fair Recent;   Fair Remote;   Fair  Judgement:  Poor  Insight:  Lacking  Psychomotor Activity:  Normal  Concentration:  Concentration: Fair  Recall:  [employer] of Knowledge:  Fair  Language:  Fair  Akathisia:  No  Handed:    AIMS (if indicated):     Assets:  Housing Physical Health Resilience Social Support  ADL's:  Intact  Cognition:  WNL  Sleep:       Treatment Plan Summary: Daily contact with patient to assess and evaluate symptoms and progress in treatment and Medication management  Observation Level/Precautions:  15 minute checks  Laboratory:  CBC Chemistry Profile HbAIC UDS UA  Psychotherapy:  Encourage participation in groups and therapeutic milieu   Medications:  Restart invega  po qDay. Administer Gean Birchwood  IM q30days for which he is due. Continue albuterol 108 mcg/act 2 puffs q4h prn SOB/wheeze. Continue cogentin  po BID prn EPS. Continue atarax  po TID prn anxiety. Continue claritin  po qDay. Continue singulair  po qhs. Continue trazodone  po qhs prn insomnia.  Consultations:    Discharge Concerns:    Estimated LOS: 3-5 days  Other:     Physician Treatment Plan for Primary Diagnosis: Schizophrenia, paranoid type (HCC) Long Term Goal(s): Improvement in symptoms so as ready for discharge  Short Term Goals: Compliance with prescribed medications will improve  Physician Treatment Plan for Secondary Diagnosis: Principal Problem:   Schizophrenia, paranoid type (HCC)  Long Term Goal(s): Improvement in symptoms so as ready for discharge  Short Term Goals: Ability to identify and develop effective coping behaviors will improve  I certify that inpatient services furnished can reasonably be expected to improve the patient's condition.    Micheal Likens, MD 5/10/201910:24 AM

## 2018-02-15 NOTE — Progress Notes (Signed)
Patient ID: [name], male   DOB: 1990/10/11, 27 y.o.   MRN: [id number]  Pt currently requesting medication for sleep. Provider notified of patients wheezes, to assess in person. Report given to accepting RN, Bonita Quin.

## 2018-02-15 NOTE — Progress Notes (Signed)
Recreation Therapy Notes  Date: 5.10.19 Time: 1000 Location: 500 Hall Dayroom  Group Topic: Stress Management  Goal Area(s) Addresses:  Patient will verbalize importance of using healthy stress management.  Patient will identify positive emotions associated with healthy stress management.   Intervention: Stress Management  Activity :  Loving-Kindness Meditation.  LRT played a meditation on focusing on giving love to people in our lives.  Patients were to follow along as the meditation played.  Education:  Stress Management, Discharge Planning.   Education Outcome: Acknowledges edcuation/In group clarification offered/Needs additional education  Clinical Observations/Feedback: Pt did not attend group.     Crystalynn Mcinerney, LRT/CTRS         Teigen Bellin A 02/15/2018 12:16 PM 

## 2018-02-15 NOTE — Progress Notes (Signed)
Pt in dayroom alone for assessment.  Pt sts that he is paranoid and cannot sit still.  Pt c/o insomnia and wants to be able to sleep.  Pt is flat, makes brief eye contact, and is anxious.  Pt wantys to be observed while sleeping d/t paranoid thoughts and does not like his room.  Pt worried that he will not be able to sleep. Pt given support and encouragement.  Pt is med compliant.  Pt denies SI, HI and AVH.  Pt verbally contracts for safety.  Pt safety maintained with q 15 min checks. Pt remains safe on unit.

## 2018-02-15 NOTE — Progress Notes (Signed)
Recreation Therapy Notes  Patient admitted to unit 5.9.19. Due to admission within last year, no new assessment conducted at this time. Last assessment conducted 4.11.19. Patient stated he didn't have any stressors.  Pt stated he came to the hospital because he couldn't breathe.    Patient denies SI, HI, AVH at this time. Patient reports goal of needing someone to talk to.  Information found below from assessment conducted 4.11.19:  Coping Skills:  Isolation, Sports, TV, Music  Leisure Interests:  Phone, Video Games  Areas of Improvement:  Pt unable to identify  Pt goal for hospitalization:  Pt unable to identify    Gabriele Loveland Lillia Abed, LRT/CTRS     Caroll Rancher A 02/15/2018 12:58 PM

## 2018-02-15 NOTE — [facility] Counselor (Signed)
Adult Comprehensive Assessment  Patient ID: [name], male   DOB: October 28, 1990, 27 y.o.   MRN: [id number]  Information Source: Information source: Patient  Current Stressors: Educational / Learning stressors: N/A Employment / Job issues:Pt receives SSI and [place] medicaid Family Relationships: aunt is primary Youth worker / Lack of resources (include bankruptcy):Pt has limited income Housing / Lack of housing:Pt lives with his aunt Physical health (include injuries & life threatening diseases): N/A Social relationships:Pt has few social relationships Substance abuse:Pt denies all substance usehowever pt positive for THC Bereavement / Loss: N/A  Living/Environment/Situation: Living Arrangements:Pt lives with his aunt Living conditions (as described by patient or guardian):"it's ok" How long has patient lived in current situation?:over a month What is atmosphere in current home: Comfortable, Supportive  Family History: Marital status: Single Are you sexually active?: No What is your sexual orientation?: Straight  Has your sexual activity been affected by drugs, alcohol, medication, or emotional stress?: N/A Does patient have children?: No  Childhood History: By whom was/is the patient raised?: Grandparents Description of patient's relationship with caregiver when they were a child: "I don't know" Patient's description of current relationship with people who raised him/her: "I don't know" How were you disciplined when you got in trouble as a child/adolescent?: "Man I guess punishment" Does patient have siblings?: Yes Number of Siblings: 1 Description of patient's current relationship with siblings: Does not talk to brother Did patient suffer any verbal/emotional/physical/sexual abuse as a child?: No Did patient suffer from severe childhood neglect?: No Has patient ever been sexually abused/assaulted/raped as an adolescent or adult?: No Was the patient  ever a victim of a crime or a disaster?: No Witnessed domestic violence?: No Has patient been effected by domestic violence as an adult?: No  Education: Highest grade of school patient has completed: 9th grade Currently a student?: No Name of school: n/a  Employment/Work Situation: Employment situation:Pt receives SSI, unknown when payments began Patient's job has been impacted by current illness: Yes 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 What is the longest time patient has a held a job?: "For a few" Where was the patient employed at that time?: [employer] Has patient ever been in the [employer]?: No Has patient ever served in combat?: No Did You Receive Any Psychiatric Treatment/Services While in Equities trader?: No Are There Guns or Other Weapons in Your Home?: No Are These Weapons Safely Secured?: Yes (N/A) Who Could Verify You Are Able To Have These Secured:: N/A  Financial Resources:   Alcohol/Substance Abuse: What has been your use of drugs/alcohol within the last 12 months?:Pt denies all substance use; however pt positive for marijuana.  If attempted suicide, did drugs/alcohol play a role in this?: No Alcohol/Substance Abuse Treatment Hx: admitted to [facility] 01/16/18  Social Support System: Patient's Community Support System: Poor Describe Community Support System:"I don't know" Type of faith/religion:N/A How does patient's faith help to cope with current illness?:N/A  Leisure/Recreation: Leisure and Hobbies:Football, watching tv  Strengths/Needs: What things does the patient do well?:Video games In what areas does patient struggle / problems for patient:"I want my life to be better"  Discharge Plan: Does patient have access to transportation?: Yes Will patient be returning to same living situation after discharge?: Yes--home with aunt. Currently receiving community mental health services:No(From Whom) --[facility]  appt will be made. Does patient have financial barriers related to discharge medications?: No         Summary/Recommendations:   Summary and Recommendations (to be completed  by the evaluator): Patient is 26yo male living in [place], [place]) with his aunt. Patient was admitted to the hospital due to [facility], paranoia, breathing issues, and for medication stabilization. Patient has a prior diagnosis of Schizophrenia. Patient denies all symtpoms and is requesting to discharge. Patient denies SI/HI/AVH. Pt has not been compliant with medications and discharged from [facility] last month. Recommendations for patient include: crisis stabilization, therapeutic milieu, encourage group attendance and participation, medication management for mood stabilization, and development of comprehensive mental wellness plan. CSW assessing for appropriate referrals.   [name] Smart LCSW 02/15/2018 1:55 PM

## 2018-02-15 NOTE — [facility] Group Notes (Signed)
[facility] LCSW Group Therapy  02/15/2018  1:05 PM  Type of Therapy:  Group therapy  Participation Level:  Invited.  Chose to not attend.  Participation Quality:  Attentive  Affect:  Flat  Cognitive:  Oriented  Insight:  Limited  Engagement in Therapy:  Limited  Modes of Intervention:  Discussion, Socialization  Summary of Progress/Problems:  Chaplain was here to lead a group on themes of hope and courage.  [name] 02/15/2018 10:36 AM

## 2018-02-15 NOTE — Tx Team (Signed)
Interdisciplinary Treatment and Diagnostic Plan Update  02/15/2018 Time of Session: 1:05 PM  [name] MRN: [id number]  Principal Diagnosis: Schizophrenia, paranoid type [facility])  Secondary Diagnoses: Principal Problem:   Schizophrenia, paranoid type ([place])   Current Medications:  Current Facility-Administered Medications  Medication Dose Route Frequency Provider Last Rate Last Dose  . acetaminophen (TYLENOL) tablet 650 mg  650 mg Oral Q6H PRN Nanci Pina, FNP      . albuterol (PROVENTIL HFA;VENTOLIN HFA) 108 (90 Base) MCG/ACT inhaler 2 puff  2 puff Inhalation Q4H PRN Nanci Pina, FNP   2 puff at 02/14/18 2309  . alum & mag hydroxide-simeth (MAALOX/MYLANTA) 200-200-20 MG/5ML suspension 30 mL  30 mL Oral Q4H PRN Starkes, Takia S, FNP      . benztropine (COGENTIN) tablet 1 mg  1 mg Oral BID PRN Nanci Pina, FNP      . hydrOXYzine (ATARAX/VISTARIL) tablet 25 mg  25 mg Oral TID PRN Nanci Pina, FNP   25 mg at 02/15/18 0935  . loratadine (CLARITIN) tablet 10 mg  10 mg Oral Daily Nanci Pina, FNP   10 mg at 02/15/18 0910  . magnesium hydroxide (MILK OF MAGNESIA) suspension 30 mL  30 mL Oral Daily PRN Starkes, Takia S, FNP      . montelukast (SINGULAIR) tablet 10 mg  10 mg Oral QHS Starkes, Takia S, FNP      . nicotine (NICODERM CQ - dosed in mg/24 hours) patch 21 mg  21 mg Transdermal Q0600 Starkes, Takia S, FNP      . paliperidone (INVEGA SUSTENNA) injection 156 mg  156 mg Intramuscular Q30 days Pennelope Bracken, MD   156 mg at 02/15/18 1030  . paliperidone (INVEGA) 24 hr tablet 6 mg  6 mg Oral QHS Nanci Pina, FNP   6 mg at 02/14/18 2308  . traZODone (DESYREL) tablet 100 mg  100 mg Oral QHS PRN Nanci Pina, FNP   100 mg at 02/14/18 2308    PTA Medications: Medications Prior to Admission  Medication Sig Dispense Refill Last Dose  . albuterol (PROVENTIL HFA;VENTOLIN HFA) 108 (90 Base) MCG/ACT inhaler Inhale 2 puffs into the lungs every 4 (four) hours  as needed for wheezing or shortness of breath (cough). (Patient not taking: Reported on 01/28/2018)   Not Taking at Unknown time  . benztropine (COGENTIN) 1 MG tablet Take 1 tablet (1 mg total) by mouth 2 (two) times daily as needed (EPS). (Patient not taking: Reported on 01/28/2018) 60 tablet 0 Not Taking at Unknown time  . cetirizine-pseudoephedrine (ZYRTEC-D) 5-120 MG tablet Take 1 tablet by mouth daily. 14 tablet 0   . hydrOXYzine (ATARAX/VISTARIL) 25 MG tablet Take 1 tablet (25 mg total) by mouth 3 (three) times daily as needed (mild/moderate anxiety). (Patient not taking: Reported on 01/28/2018) 60 tablet 0 Not Taking at Unknown time  . [START ON 02/21/2018] paliperidone (INVEGA SUSTENNA) 156 MG/ML SUSP injection Inject 1 mL (156 mg total) into the muscle every 30 (thirty) days. For mood control (Patient not taking: Reported on 01/28/2018) 0.9 mL 0 Not Taking at Unknown time  . paliperidone (INVEGA) 6 MG 24 hr tablet Take 1 tablet (6 mg total) by mouth at bedtime. For mood control (Patient not taking: Reported on 01/28/2018) 15 tablet 0 Not Taking at Unknown time  . predniSONE (DELTASONE) 20 MG tablet Take 2 tablets (40 mg total) by mouth daily with breakfast. (Patient not taking: Reported on 01/28/2018) 4 tablet 0 Not Taking  at Unknown time  . traZODone (DESYREL) 100 MG tablet Take 1 tablet (100 mg total) by mouth at bedtime as needed for sleep. (Patient not taking: Reported on 01/28/2018) 30 tablet 0 Not Taking at Unknown time    Patient Stressors: Health problems Medication change or noncompliance Other: psychosis  Patient Strengths: Ability for insight Communication skills General fund of knowledge  Treatment Modalities: Medication Management, Group therapy, Case management,  1 to 1 session with clinician, Psychoeducation, Recreational therapy.   Physician Treatment Plan for Primary Diagnosis: Schizophrenia, paranoid type ([place]) Long Term Goal(s): Improvement in symptoms so as ready for  discharge  Short Term Goals: Compliance with prescribed medications will improve Ability to identify and develop effective coping behaviors will improve  Medication Management: Evaluate patient's response, side effects, and tolerance of medication regimen.  Therapeutic Interventions: 1 to 1 sessions, Unit Group sessions and Medication administration.  Evaluation of Outcomes: Progressing  Physician Treatment Plan for Secondary Diagnosis: Principal Problem:   Schizophrenia, paranoid type ([place])   Long Term Goal(s): Improvement in symptoms so as ready for discharge  Short Term Goals: Compliance with prescribed medications will improve Ability to identify and develop effective coping behaviors will improve  Medication Management: Evaluate patient's response, side effects, and tolerance of medication regimen.  Therapeutic Interventions: 1 to 1 sessions, Unit Group sessions and Medication administration.  Evaluation of Outcomes: Progressing   RN Treatment Plan for Primary Diagnosis: Schizophrenia, paranoid type ([place]) Long Term Goal(s): Knowledge of disease and therapeutic regimen to maintain health will improve  Short Term Goals: Ability to identify and develop effective coping behaviors will improve and Compliance with prescribed medications will improve  Medication Management: RN will administer medications as ordered by provider, will assess and evaluate patient's response and provide education to patient for prescribed medication. RN will report any adverse and/or side effects to prescribing provider.  Therapeutic Interventions: 1 on 1 counseling sessions, Psychoeducation, Medication administration, Evaluate responses to treatment, Monitor vital signs and CBGs as ordered, Perform/monitor CIWA, COWS, AIMS and Fall Risk screenings as ordered, Perform wound care treatments as ordered.  Evaluation of Outcomes: Progressing   LCSW Treatment Plan for Primary Diagnosis: Schizophrenia,  paranoid type ([place]) Long Term Goal(s): Safe transition to appropriate next level of care at discharge, Engage patient in therapeutic group addressing interpersonal concerns.  Short Term Goals: Engage patient in aftercare planning with referrals and resources  Therapeutic Interventions: Assess for all discharge needs, 1 to 1 time with Social worker, Explore available resources and support systems, Assess for adequacy in community support network, Educate family and significant other(s) on suicide prevention, Complete Psychosocial Assessment, Interpersonal group therapy.  Evaluation of Outcomes: Met  Return home, follow up outpt   Progress in Treatment: Attending groups: Yes Participating in groups: Yes Taking medication as prescribed: Yes Toleration medication: Yes, no side effects reported at this time Family/Significant other contact made: No Patient understands diagnosis: No Limited insight Discussing patient identified problems/goals with staff: Yes Medical problems stabilized or resolved: Yes Denies suicidal/homicidal ideation: Yes Issues/concerns per patient self-inventory: None Other: N/A  New problem(s) identified: None identified at this time.   New Short Term/Long Term Goal(s): "I came in because I was having trouble breathing.  I'm OK now.  I'm ready to go home."   Discharge Plan or Barriers:   Reason for Continuation of Hospitalization: Disorganization Paranoia Medication stabilization   Estimated Length of Stay: 5/15  Attendees: Patient: [name] 02/15/2018  1:05 PM  Physician: Maris Berger, MD 02/15/2018  1:05  PM  Nursing: Phillis Haggis, RN 02/15/2018  1:05 PM  RN Care Manager: Lars Pinks, RN 02/15/2018  1:05 PM  Social Worker: Ripley Fraise 02/15/2018  1:05 PM  Recreational Therapist: Winfield Cunas 02/15/2018  1:05 PM  Other: Norberto Sorenson 02/15/2018  1:05 PM  Other:  02/15/2018  1:05 PM    Scribe for Treatment Team:  Roque Lias LCSW 02/15/2018  1:05 PM

## 2018-02-16 DIAGNOSIS — R451 Restlessness and agitation: Secondary | ICD-10-CM

## 2018-02-16 DIAGNOSIS — J45909 Unspecified asthma, uncomplicated: Secondary | ICD-10-CM

## 2018-02-16 DIAGNOSIS — F339 Major depressive disorder, recurrent, unspecified: Secondary | ICD-10-CM

## 2018-02-16 DIAGNOSIS — G259 Extrapyramidal and movement disorder, unspecified: Secondary | ICD-10-CM

## 2018-02-16 DIAGNOSIS — F29 Unspecified psychosis not due to a substance or known physiological condition: Secondary | ICD-10-CM

## 2018-02-16 LAB — PROLACTIN: Prolactin: 79.4 ng/mL — ABNORMAL HIGH (ref 4.0–15.2)

## 2018-02-16 NOTE — Progress Notes (Addendum)
[facility] MD Progress Note  02/16/2018 11:20 AM Krystian Younglove  MRN:  [id number]  Subjective: Lamoyne reports, "I'm doing okay. I'm ready to go home. I don't know why I came to this hospital. I need to be discharged today. I'm doing well".   Jamarkis Branam is a 27 y/o M with history of schizophrenia who was admitted voluntarily to [facility] after brought in by EMS as per his request, after he reported that he was hearing voices and having paranoia. Upon initial presentation to [facility] he reported paranoia and AH of a voice. Pt has recent relevant history of admission to [facility] last month with discharge on 01/22/18. Since that time pt has had multiple presentations to [facility] for symptoms of paranoia and anxiety in the context of substance use of cannabis and cocaine. He was restarted on oral form of Invega upon admission. He is due for next injection of Invega Sustenna 156IM q30 days on 02/19/18. Upon initial evaluation, pt is guarded, minimizing of his presenting symptoms, and requesting for discharge. He shares, "I couldn't breathe around all those babies, so I called for the ambulance, and I asked that they bring me here instead of [facility]."   Today, 02-16-18, Harlee is seen, chart reviewed. The chart findings dicussed with the treatment team. He is lying down in his bed. He is making fair eye contact. He asked why he is in the hospital. He says he is ready to go home. He denies any new issues or concerns. He has not attended any group sessions as yet. He is taking & tolerating his treatment regimen. He currently denies any SIHI, AVH or delusional thoughts. Staff reports that patient displays some form of paranoia. No delusions observed. He has agreed to continue current plan of care already in progress.   Principal Problem: Schizophrenia, paranoid type (HCC)  Diagnosis:   Patient Active Problem List   Diagnosis Date Noted  . 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 Psychosis (HCC) [F29]   . Schizophrenia  (HCC) [F20.9] 05/12/2016  . Cannabis use disorder, moderate, dependence (HCC) [F12.20] 05/10/2016   Total Time spent with patient: 25 minutes  Past Psychiatric History: See H&P  Past Medical History:  Past Medical History:  Diagnosis Date  . Depression   . Schizophrenia [facility])     Past Surgical History:  Procedure Laterality Date  . BACK SURGERY     Family History: History reviewed. No pertinent family history.  Family Psychiatric  History: See H&P  Social History:  Social History   Substance and Sexual Activity  Alcohol Use No     Social History   Substance and Sexual Activity  Drug Use Yes  . Types: Marijuana   Comment: rare    Social History   Socioeconomic History  . Marital status: Single    Spouse name: Not on file  . Number of children: Not on file  . Years of education: Not on file  . Highest education level: Not on file  Occupational History  . Not on file  Social Needs  . Financial resource strain: Not on file  . Food insecurity:    Worry: Not on file    Inability: Not on file  . Transportation needs:    Medical: Not on file    Non-medical: Not on file  Tobacco Use  . Smoking status: Current Some Day Smoker  . Smokeless tobacco: Never Used  Substance and Sexual Activity  . Alcohol use: No  . Drug use: Yes  Types: Marijuana    Comment: rare  . Sexual activity: Yes    Birth control/protection: Condom  Lifestyle  . Physical activity:    Days per week: Not on file    Minutes per session: Not on file  . Stress: Not on file  Relationships  . Social connections:    Talks on phone: Not on file    Gets together: Not on file    Attends religious service: Not on file    Active member of club or organization: Not on file    Attends meetings of clubs or organizations: Not on file    Relationship status: Not on file  Other Topics Concern  . Not on file  Social History Narrative  . Not on file   Additional Social History:    Pain  Medications: see PTA meds Prescriptions: see PTA meds Over the Counter: see PTA meds History of alcohol / drug use?: Yes Longest period of sobriety (when/how long): Unknown Name of Substance 1: Cannabis per record review 1 - Age of First Use: Unknown 1 - Amount (size/oz): Unknown 1 - Frequency: Unknown 1 - Duration: Unknown 1 - Last Use / Amount: Unknown  Sleep: Good  Appetite:  Good  Current Medications: Current Facility-Administered Medications  Medication Dose Route Frequency Provider Last Rate Last Dose  . acetaminophen (TYLENOL) tablet 650 mg  650 mg Oral Q6H PRN Truman Hayward, FNP      . albuterol (PROVENTIL HFA;VENTOLIN HFA) 108 (90 Base) MCG/ACT inhaler 2 puff  2 puff Inhalation Q4H PRN Truman Hayward, FNP   2 puff at 02/14/18 2309  . alum & mag hydroxide-simeth (MAALOX/MYLANTA) 200-200-20 MG/5ML suspension 30 mL  30 mL Oral Q4H PRN Starkes, Takia S, FNP      . benztropine (COGENTIN) tablet 1 mg  1 mg Oral BID PRN Truman Hayward, FNP      . hydrOXYzine (ATARAX/VISTARIL) tablet 50 mg  50 mg Oral TID PRN Micheal Likens, MD      . loratadine (CLARITIN) tablet 10 mg  10 mg Oral Daily Truman Hayward, FNP   10 mg at 02/16/18 0819  . LORazepam (ATIVAN) tablet 1 mg  1 mg Oral Q6H PRN Micheal Likens, MD       Or  . LORazepam (ATIVAN) injection 1 mg  1 mg Intramuscular Q6H PRN Micheal Likens, MD   1 mg at 02/15/18 1515  . magnesium hydroxide (MILK OF MAGNESIA) suspension 30 mL  30 mL Oral Daily PRN Darcella Gasman, Takia S, FNP      . montelukast (SINGULAIR) tablet 10 mg  10 mg Oral QHS Truman Hayward, FNP   10 mg at 02/15/18 2118  . nicotine (NICODERM CQ - dosed in mg/24 hours) patch 21 mg  21 mg Transdermal Q0600 Truman Hayward, FNP      . OLANZapine zydis (ZYPREXA) disintegrating tablet 10 mg  10 mg Oral Q8H PRN Micheal Likens, MD       Or  . ziprasidone (GEODON) injection 20 mg  20 mg Intramuscular Q12H PRN Micheal Likens, MD    20 mg at 02/15/18 1515  . paliperidone (INVEGA SUSTENNA) injection 156 mg  156 mg Intramuscular Q30 days Micheal Likens, MD   156 mg at 02/15/18 1030  . paliperidone (INVEGA) 24 hr tablet 6 mg  6 mg Oral QHS Truman Hayward, FNP   6 mg at 02/15/18 2118  . traZODone (DESYREL) tablet 100 mg  100 mg  Oral QHS PRN Truman Hayward, FNP   100 mg at 02/15/18 2118   Lab Results:  Results for orders placed or performed during the hospital encounter of 02/14/18 (from the past 48 hour(s))  CBC     Status: None   Collection Time: 02/15/18  6:26 AM  Result Value Ref Range   WBC 4.8 4.0 - 10.5 K/uL   RBC 4.77 4.22 - 5.81 MIL/uL   Hemoglobin 14.7 13.0 - 17.0 g/dL   HCT 96.0 45.4 - 09.8 %   MCV 91.2 78.0 - 100.0 fL   MCH 30.8 26.0 - 34.0 pg   MCHC 33.8 30.0 - 36.0 g/dL   RDW 11.9 14.7 - 82.9 %   Platelets 229 150 - 400 K/uL    Comment: Performed at [facility], [address] [place], [place] [address]  Ethanol     Status: None   Collection Time: 02/15/18  6:26 AM  Result Value Ref Range   Alcohol, Ethyl (B) <10 <10 mg/dL    Comment:        LOWEST DETECTABLE LIMIT FOR SERUM ALCOHOL IS 10 mg/dL FOR MEDICAL PURPOSES ONLY Performed at [facility], [address], [place], [place] [address]   TSH     Status: None   Collection Time: 02/15/18  6:26 AM  Result Value Ref Range   TSH 1.615 0.350 - 4.500 uIU/mL    Comment: Performed by a 3rd Generation assay with a functional sensitivity of <=0.01 uIU/mL. Performed at [facility], [address], [place], [place] [address]   Prolactin     Status: Abnormal   Collection Time: 02/15/18  6:26 AM  Result Value Ref Range   Prolactin 79.4 (H) 4.0 - 15.2 ng/mL    Comment: (NOTE) Performed At: [facility] [address] [place], [place] [id number] Jolene Schimke MD [id number] Performed at [facility], [address], [place], [place] [address]    Urinalysis, Routine w reflex microscopic     Status: Abnormal   Collection Time: 02/15/18 12:26 PM  Result Value Ref Range   Color, Urine STRAW (A) YELLOW   APPearance CLEAR CLEAR   Specific Gravity, Urine 1.013 1.005 - 1.030   pH 8.0 5.0 - 8.0   Glucose, UA NEGATIVE NEGATIVE mg/dL   Hgb urine dipstick NEGATIVE NEGATIVE   Bilirubin Urine NEGATIVE NEGATIVE   Ketones, ur NEGATIVE NEGATIVE mg/dL   Protein, ur NEGATIVE NEGATIVE mg/dL   Nitrite NEGATIVE NEGATIVE   Leukocytes, UA NEGATIVE NEGATIVE    Comment: Performed at [facility], [address] [place], [place] [address]  Urine rapid drug screen (hosp performed)not at [facility]     Status: Abnormal   Collection Time: 02/15/18 12:26 PM  Result Value Ref Range   Opiates NONE DETECTED NONE DETECTED   Cocaine NONE DETECTED NONE DETECTED   Benzodiazepines NONE DETECTED NONE DETECTED   Amphetamines NONE DETECTED NONE DETECTED   Tetrahydrocannabinol POSITIVE (A) NONE DETECTED   Barbiturates NONE DETECTED NONE DETECTED    Comment: (NOTE) DRUG SCREEN FOR MEDICAL PURPOSES ONLY.  IF CONFIRMATION IS NEEDED FOR ANY PURPOSE, NOTIFY LAB WITHIN 5 DAYS. LOWEST DETECTABLE LIMITS FOR URINE DRUG SCREEN Drug Class                     Cutoff (ng/mL) Amphetamine and metabolites    1000 Barbiturate and metabolites    200 Benzodiazepine  200 Tricyclics and metabolites     300 Opiates and metabolites        300 Cocaine and metabolites        300 THC                            50 Performed at [facility], [address], [place], [place] [address]    Blood Alcohol level:  Lab Results  Component Value Date   ETH <10 02/15/2018   ETH <10 01/28/2018   Metabolic Disorder Labs: Lab Results  Component Value Date   HGBA1C 5.2 05/12/2016   Lab Results  Component Value Date   PROLACTIN 79.4 (H) 02/15/2018   PROLACTIN 30.5 (H) 05/12/2016   Lab Results  Component Value Date   CHOL 176  05/12/2016   TRIG 312 (H) 05/12/2016   HDL 55 05/12/2016   CHOLHDL 3.2 05/12/2016   VLDL 62 (H) 05/12/2016   LDLCALC 59 05/12/2016   Physical Findings: AIMS: Facial and Oral Movements Muscles of Facial Expression: None, normal Lips and Perioral Area: None, normal Jaw: None, normal Tongue: None, normal,Extremity Movements Upper (arms, wrists, hands, fingers): None, normal Lower (legs, knees, ankles, toes): None, normal, Trunk Movements Neck, shoulders, hips: None, normal, Overall Severity Severity of abnormal movements (highest score from questions above): None, normal Incapacitation due to abnormal movements: None, normal Patient's awareness of abnormal movements (rate only patient's report): No Awareness, Dental Status Current problems with teeth and/or dentures?: No Does patient usually wear dentures?: No  CIWA:    COWS:     Musculoskeletal: Strength & Muscle Tone: within normal limits Gait & Station: normal Patient leans: N/A  Psychiatric Specialty Exam: Physical Exam  Nursing note and vitals reviewed.   Review of Systems  Psychiatric/Behavioral: Positive for depression and substance abuse (Hx. Cannabis use disorder). Negative for hallucinations, memory loss and suicidal ideas. The patient is not nervous/anxious and does not have insomnia.     Blood pressure (!) 129/93, pulse 83, temperature 98.3 F (36.8 C), temperature source Oral, resp. rate 18, height  (1.651 m), weight 74.8 kg (165 lb), SpO2 100 %.Body mass index is 27.46 kg/m.  General Appearance: Casual and Fairly Groomed  Eye Contact:  Good  Speech:  Clear and Coherent and Normal Rate  Volume:  Normal  Mood:  Euthymic  Affect:  Appropriate, Congruent and Flat  Thought Process:  Coherent and Goal Directed  Orientation:  Full (Time, Place, and Person)  Thought Content:  Hallucinations: Auditory and Paranoid Ideation  Suicidal Thoughts:  No  Homicidal Thoughts:  No  Memory:  Immediate;   Fair Recent;    Fair Remote;   Fair  Judgement:  Poor  Insight:  Lacking  Psychomotor Activity:  Normal  Concentration:  Concentration: Fair  Recall:  [employer] of Knowledge:  Fair  Language:  Fair  Akathisia:  No  Handed:    AIMS (if indicated):     Assets:  Housing Physical Health Resilience Social Support  ADL's:  Intact  Cognition:  WNL  Sleep: 6.5       Treatment Plan Summary: Daily contact with patient to assess and evaluate symptoms and progress in treatment.  - Continue inpatient hospitalization.  - Will continue today 02/16/2018 plan as below except where it is noted.  - Mood control.       - Continue paliperidone (Invega) 6 mg po daily.       - Continue  paliperidone (Invega SUSTENNa) IN Q 30 days (administered 02-15-18).  - EPS.      - Continue Cogentin 1 mg po bid prn.  - Anxiety.      - Continue Hydroxyzine 50 mg po tid prn.  - Insomnia.      - Continue Trazodone 100 mg po Q hs prn.  - Nicotine withdrawal.       - Continue Nicoderm patch 21 mg topically Q 24 hours.  - Agitation/psychosis.      - Continue Olanzapine Zydis (Zyprexa) 10 mg po Q 8 hrs prn.         Or      - Ziprasidone (Geodone) 20 mg IM Q 12 hours prn.  - Other medical issues.     Asthma: Continue Montelukast (singulair) 10 mg po daily.                   - Continue Albuterol inhaler 109 (90 Base) MCG/ACT 2 puffs Every 4 hours prn For SOB.  - Patient to continue to attend & participate in group milieu.  - Discharge disposition plan ongoing.  Armandina Stammer, NP, PMHNP, FNP-BC 02/16/2018, 11:20 AM   ..Agree with NP Progress Note

## 2018-02-16 NOTE — Progress Notes (Signed)
Patient has been increasingly paranoid.  Patient required prn medications to help with reduction of symptoms.  Patient currently resting quietly in his room.   Assess patient for safety offer medications as prescribed engage patient in 1:1 staff talks.   Continue to monitor as planned. Patient able to maintain safety.

## 2018-02-16 NOTE — [facility] Group Notes (Signed)
[facility] Group Notes: (Clinical Social Work)   02/16/2018      Type of Therapy:  Group Therapy   Participation Level:  Did Not Attend - sleeping   Kyleena Scheirer Grossman-Orr, LCSW 02/16/2018, 12:03 PM 

## 2018-02-16 NOTE — Plan of Care (Signed)
D: Pt denies SI/HI/AVH. Pt is pleasant and cooperative. Pt very paranoid, pt initially refused to take his Invega, but Clinical research associate convince pt to take it, pt visibly having issues swallowing pills . Pt voiced his desire to leave" I don't like places like this" pt reassured he was safe here.   A: Pt was offered support and encouragement. Pt was given scheduled medications. Pt was encourage to attend groups. Q 15 minute checks were done for safety.   R: safety maintained on unit.   Problem: Activity: Goal: Sleeping patterns will improve Outcome: Progressing   Problem: Safety: Goal: Periods of time without injury will increase Outcome: Progressing

## 2018-02-17 NOTE — [facility] Group Notes (Signed)
[facility] Group Notes: (Clinical Social Work)   02/17/2018      Type of Therapy:  Group Therapy   Participation Level:  Did Not Attend despite MHT prompting   Ambrose Mantle, LCSW 02/17/2018, 1:00 PM

## 2018-02-17 NOTE — Plan of Care (Signed)
D: Pt denies SI/HI/AVH. Pt is paranoid and appears to be responding to internal stimuli at times . Pt continues to endorse having issues swallowing pills. Pt continues to say he wants to go home, and does not like it here . " I don't like place like this, I feel worse" A: Pt was offered support and encouragement. Pt was given scheduled medications. Pt was encourage to attend groups. Q 15 minute checks were done for safety.   R: safety maintained on unit.   Problem: Activity: Goal: Sleeping patterns will improve Outcome: Progressing   Problem: Coping: Goal: Ability to verbalize frustrations and anger appropriately will improve Outcome: Progressing   Problem: Coping: Goal: Ability to demonstrate self-control will improve Outcome: Progressing

## 2018-02-17 NOTE — Plan of Care (Signed)
Patient acting paranoid, and responding to internal stimuli. Patient endorsing Ah. Patient reports poor sleep. Patient contracts for safety and denies SI.

## 2018-02-17 NOTE — Progress Notes (Addendum)
D: Patient presents irritable, guarded, paranoid, responding to internal stimuli. Patient was observed talking to himself, muttering incoherently. Patient approached nurse's station and c/o AH, but wasn't able to describe the voices or what they were saying. Patient pointed to window, saying "they are, I don't know" and trailed off. Patient acting suspicious when taking medications, and appeared to be fearful to swallow pills. Patient reports he slept poorly last night, and was tired this morning. Patient acting as if he was going to fall when walking to room. Patient denies SI/HI.  A: Patient checked q15 min, and checks reviewed. Reviewed medication changes with patient and educated on side effects. Educated patient on importance of attending group therapy sessions and educated on several coping skills. Encouarged participation in milieu through recreation therapy and attending meals with peers. Administered Zyprexa  ODT for AH, agitation, psychosis. Encouraged patient to take a nap. Walked patient to room, and reviewed fall risk safety precautions. R: Patient receptive to education on medications, and is medication compliant. Patient did not fill out self inventory. Patient frequently isolates to room. Patient contracts for safety on the unit.

## 2018-02-17 NOTE — Progress Notes (Addendum)
[facility] MD Progress Note  02/17/2018 11:36 AM [name]  MRN:  [id number]  Subjective: [name] reports, "I'm doing okay. I'm just ready to go home. I don't feel like talking right now".   [name] is a 27 y/o M with history of schizophrenia who was admitted voluntarily to [facility] after brought in by EMS as per his request, after he reported that he was hearing voices and having paranoia. Upon initial presentation to [facility] he reported paranoia and AH of a voice. Pt has recent relevant history of admission to [facility] last month with discharge on 01/22/18. Since that time pt has had multiple presentations to [facility] for symptoms of paranoia and anxiety in the context of substance use of cannabis and cocaine. He was restarted on oral form of Invega upon admission. He is due for next injection of Invega Sustenna 156IM q30 days on 02/19/18. Upon initial evaluation, pt is guarded, minimizing of his presenting symptoms, and requesting for discharge. He shares, "I couldn't breathe around all those babies, so I called for the ambulance, and I asked that they bring me here instead of [facility]."   Today, 02-18-18, [name] is seen, chart reviewed. The chart findings dicussed with the treatment team. Just like yesterday during follow-up care evaluation, [name] is lying down in his bed. He is making fair eye contact. He says,"I'm doing okay. I'm just ready to go home. I don't feel like talking right now".  Staff reports per chart review that patient reported today of having auditory hallucinations & was responding to some internal stimuli. However, that patient was unable to elaborate, other than pointing at a window as he presented paranoid. Patient seem to be thought blocking as well. He received a paliperidone sustenna 156 mg/ml IM dose on 02-15-18. He continues on the daily dose of po paliperidone 6 mg as recommended. He has not attended any group sessions as of yet. He is taking & tolerating his treatment regimen. He currently  denies any SIHI. He has agreed to continue current plan of care already in progress. No questions asked & no concerns displayed.  Principal Problem: Schizophrenia, paranoid type (HCC)  Diagnosis:   Patient Active Problem List   Diagnosis Date Noted  . 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 Psychosis (HCC) [F29]   . Schizophrenia (HCC) [F20.9] 05/12/2016  . Cannabis use disorder, moderate, dependence (HCC) [F12.20] 05/10/2016   Total Time spent with patient: 25 minutes  Past Psychiatric History: See H&P  Past Medical History:  Past Medical History:  Diagnosis Date  . Depression   . Schizophrenia [facility])     Past Surgical History:  Procedure Laterality Date  . BACK SURGERY     Family History: History reviewed. No pertinent family history.  Family Psychiatric  History: See H&P  Social History:  Social History   Substance and Sexual Activity  Alcohol Use No     Social History   Substance and Sexual Activity  Drug Use Yes  . Types: Marijuana   Comment: rare    Social History   Socioeconomic History  . Marital status: Single    Spouse name: Not on file  . Number of children: Not on file  . Years of education: Not on file  . Highest education level: Not on file  Occupational History  . Not on file  Social Needs  . Financial resource strain: Not on file  . Food insecurity:    Worry: Not on file    Inability: Not on  file  . Transportation needs:    Medical: Not on file    Non-medical: Not on file  Tobacco Use  . Smoking status: Current Some Day Smoker  . Smokeless tobacco: Never Used  Substance and Sexual Activity  . Alcohol use: No  . Drug use: Yes    Types: Marijuana    Comment: rare  . Sexual activity: Yes    Birth control/protection: Condom  Lifestyle  . Physical activity:    Days per week: Not on file    Minutes per session: Not on file  . Stress: Not on file  Relationships  . Social connections:    Talks on phone: Not on file     Gets together: Not on file    Attends religious service: Not on file    Active member of club or organization: Not on file    Attends meetings of clubs or organizations: Not on file    Relationship status: Not on file  Other Topics Concern  . Not on file  Social History Narrative  . Not on file   Additional Social History:    Pain Medications: see PTA meds Prescriptions: see PTA meds Over the Counter: see PTA meds History of alcohol / drug use?: Yes Longest period of sobriety (when/how long): Unknown Name of Substance 1: Cannabis per record review 1 - Age of First Use: Unknown 1 - Amount (size/oz): Unknown 1 - Frequency: Unknown 1 - Duration: Unknown 1 - Last Use / Amount: Unknown  Sleep: Good  Appetite:  Good  Current Medications: Current Facility-Administered Medications  Medication Dose Route Frequency Provider Last Rate Last Dose  . acetaminophen (TYLENOL) tablet 650 mg  650 mg Oral Q6H PRN Truman Hayward, FNP      . albuterol (PROVENTIL HFA;VENTOLIN HFA) 108 (90 Base) MCG/ACT inhaler 2 puff  2 puff Inhalation Q4H PRN Truman Hayward, FNP   2 puff at 02/14/18 2309  . alum & mag hydroxide-simeth (MAALOX/MYLANTA) 200-200-20 MG/5ML suspension 30 mL  30 mL Oral Q4H PRN Starkes, Takia S, FNP      . benztropine (COGENTIN) tablet 1 mg  1 mg Oral BID PRN Truman Hayward, FNP      . hydrOXYzine (ATARAX/VISTARIL) tablet 50 mg  50 mg Oral TID PRN Micheal Likens, MD   50 mg at 02/16/18 2151  . loratadine (CLARITIN) tablet 10 mg  10 mg Oral Daily Truman Hayward, FNP   10 mg at 02/17/18 0829  . LORazepam (ATIVAN) tablet 1 mg  1 mg Oral Q6H PRN Micheal Likens, MD   1 mg at 02/16/18 1318   Or  . LORazepam (ATIVAN) injection 1 mg  1 mg Intramuscular Q6H PRN Micheal Likens, MD   1 mg at 02/15/18 1515  . magnesium hydroxide (MILK OF MAGNESIA) suspension 30 mL  30 mL Oral Daily PRN Starkes, Takia S, FNP      . montelukast (SINGULAIR) tablet 10 mg  10  mg Oral QHS Truman Hayward, FNP   10 mg at 02/16/18 2150  . nicotine (NICODERM CQ - dosed in mg/24 hours) patch 21 mg  21 mg Transdermal Q0600 Truman Hayward, FNP      . OLANZapine zydis (ZYPREXA) disintegrating tablet 10 mg  10 mg Oral Q8H PRN Micheal Likens, MD   10 mg at 02/17/18 0932   Or  . ziprasidone (GEODON) injection 20 mg  20 mg Intramuscular Q12H PRN Micheal Likens, MD   20 mg  at 02/15/18 1515  . paliperidone (INVEGA SUSTENNA) injection 156 mg  156 mg Intramuscular Q30 days Micheal Likens, MD   156 mg at 02/15/18 1030  . paliperidone (INVEGA) 24 hr tablet 6 mg  6 mg Oral QHS Truman Hayward, FNP   6 mg at 02/16/18 2150  . traZODone (DESYREL) tablet 100 mg  100 mg Oral QHS PRN Truman Hayward, FNP   100 mg at 02/16/18 2151   Lab Results:  Results for orders placed or performed during the hospital encounter of 02/14/18 (from the past 48 hour(s))  Urinalysis, Routine w reflex microscopic     Status: Abnormal   Collection Time: 02/15/18 12:26 PM  Result Value Ref Range   Color, Urine STRAW (A) YELLOW   APPearance CLEAR CLEAR   Specific Gravity, Urine 1.013 1.005 - 1.030   pH 8.0 5.0 - 8.0   Glucose, UA NEGATIVE NEGATIVE mg/dL   Hgb urine dipstick NEGATIVE NEGATIVE   Bilirubin Urine NEGATIVE NEGATIVE   Ketones, ur NEGATIVE NEGATIVE mg/dL   Protein, ur NEGATIVE NEGATIVE mg/dL   Nitrite NEGATIVE NEGATIVE   Leukocytes, UA NEGATIVE NEGATIVE    Comment: Performed at [facility], [address], [place], [place] [address]  Urine rapid drug screen (hosp performed)not at [facility]     Status: Abnormal   Collection Time: 02/15/18 12:26 PM  Result Value Ref Range   Opiates NONE DETECTED NONE DETECTED   Cocaine NONE DETECTED NONE DETECTED   Benzodiazepines NONE DETECTED NONE DETECTED   Amphetamines NONE DETECTED NONE DETECTED   Tetrahydrocannabinol POSITIVE (A) NONE DETECTED   Barbiturates NONE DETECTED NONE DETECTED    Comment:  (NOTE) DRUG SCREEN FOR MEDICAL PURPOSES ONLY.  IF CONFIRMATION IS NEEDED FOR ANY PURPOSE, NOTIFY LAB WITHIN 5 DAYS. LOWEST DETECTABLE LIMITS FOR URINE DRUG SCREEN Drug Class                     Cutoff (ng/mL) Amphetamine and metabolites    1000 Barbiturate and metabolites    200 Benzodiazepine                 200 Tricyclics and metabolites     300 Opiates and metabolites        300 Cocaine and metabolites        300 THC                            50 Performed at [facility], [address], [place], [place] [address]    Blood Alcohol level:  Lab Results  Component Value Date   ETH <10 02/15/2018   ETH <10 01/28/2018   Metabolic Disorder Labs: Lab Results  Component Value Date   HGBA1C 5.2 05/12/2016   Lab Results  Component Value Date   PROLACTIN 79.4 (H) 02/15/2018   PROLACTIN 30.5 (H) 05/12/2016   Lab Results  Component Value Date   CHOL 176 05/12/2016   TRIG 312 (H) 05/12/2016   HDL 55 05/12/2016   CHOLHDL 3.2 05/12/2016   VLDL 62 (H) 05/12/2016   LDLCALC 59 05/12/2016   Physical Findings: AIMS: Facial and Oral Movements Muscles of Facial Expression: None, normal Lips and Perioral Area: None, normal Jaw: None, normal Tongue: None, normal,Extremity Movements Upper (arms, wrists, hands, fingers): None, normal Lower (legs, knees, ankles, toes): None, normal, Trunk Movements Neck, shoulders, hips: None, normal, Overall Severity Severity of abnormal movements (highest score from questions  above): None, normal Incapacitation due to abnormal movements: None, normal Patient's awareness of abnormal movements (rate only patient's report): No Awareness, Dental Status Current problems with teeth and/or dentures?: No Does patient usually wear dentures?: No  CIWA:  CIWA-Ar Total: 0 COWS:  COWS Total Score: 1  Musculoskeletal: Strength & Muscle Tone: within normal limits Gait & Station: normal Patient leans: N/A  Psychiatric Specialty  Exam: Physical Exam  Nursing note and vitals reviewed.   Review of Systems  Psychiatric/Behavioral: Positive for depression and substance abuse (Hx. Cannabis use disorder). Negative for hallucinations, memory loss and suicidal ideas. The patient is not nervous/anxious and does not have insomnia.     Blood pressure 127/87, pulse 89, temperature 98.1 F (36.7 C), temperature source Oral, resp. rate 18, height  (1.651 m), weight 74.8 kg (165 lb), SpO2 100 %.Body mass index is 27.46 kg/m.  General Appearance: Casual and Fairly Groomed  Eye Contact:  Good  Speech:  Clear and Coherent and Normal Rate  Volume:  Normal  Mood:  Euthymic  Affect:  Appropriate, Congruent and Flat  Thought Process:  Coherent and Goal Directed  Orientation:  Full (Time, Place, and Person)  Thought Content:  Hallucinations: Auditory and Paranoid Ideation  Suicidal Thoughts:  No  Homicidal Thoughts:  No  Memory:  Immediate;   Fair Recent;   Fair Remote;   Fair  Judgement:  Poor  Insight:  Lacking  Psychomotor Activity:  Normal  Concentration:  Concentration: Fair  Recall:  [employer] of Knowledge:  Fair  Language:  Fair  Akathisia:  No  Handed:    AIMS (if indicated):     Assets:  Housing Physical Health Resilience Social Support  ADL's:  Intact  Cognition:  WNL  Sleep: 6.5       Treatment Plan Summary: Daily contact with patient to assess and evaluate symptoms and progress in treatment.  - Continue inpatient hospitalization.  - Will continue today 02/17/2018 plan as below except where it is noted.  - Mood control.       - Continue paliperidone (Invega) 6 mg po daily.       - Continue paliperidone (Invega SUSTENNa) IN Q 30 days (administered 02-15-18).  - EPS.      - Continue Cogentin 1 mg po bid prn.  - Anxiety.      - Continue Hydroxyzine 50 mg po tid prn.  - Insomnia.      - Continue Trazodone 100 mg po Q hs prn.  - Nicotine withdrawal.       - Continue Nicoderm patch 21 mg  topically Q 24 hours.  - Agitation/psychosis.      - Continue Olanzapine Zydis (Zyprexa) 10 mg po Q 8 hrs prn.         Or      - Ziprasidone (Geodone) 20 mg IM Q 12 hours prn.  - Other medical issues.     Asthma: Continue Montelukast (singulair) 10 mg po daily.                   - Continue Albuterol inhaler 109 (90 Base) MCG/ACT 2 puffs Every 4 hours prn For SOB.  - Patient to continue to attend & participate in group milieu.  - Discharge disposition plan ongoing.  Armandina Stammer, NP, PMHNP, FNP-BC 02/17/2018, 11:36 AMPatient ID: [name], male   DOB: November 27, 1990, 27 y.o.   MRN: [id number] .Marland KitchenAgree with NP Progress Note

## 2018-02-18 DIAGNOSIS — F329 Major depressive disorder, single episode, unspecified: Secondary | ICD-10-CM

## 2018-02-18 DIAGNOSIS — E221 Hyperprolactinemia: Secondary | ICD-10-CM

## 2018-02-18 NOTE — Progress Notes (Signed)
[facility] MD Progress Note  02/18/2018 12:45 PM [name]  MRN:  [id number]  Subjective: Phat reports, "I'm OK. I really just want to go home. Can I go home today?"  Objective: Pt seen and chart reviewed. Pt is alert/oriented x4, calm, cooperative, and appropriate to situation. Pt denies suicidal/homicidal ideation and psychosis and does not appear to be responding to internal stimuli. Pt reports that he feels much better in terms of a reduction in paranoid ideation. Pt presents as linear, logical, and goal-directed. His eyes are wide open which is somewhat characteristic of chronic schizophrenia, yet he is not endorsing paranoid ideation and does not present as fearful at this time. This may be baseline for the patient.   Discussed discharge plans with patient and the goal is to discharge home tomorrow after his second Gean Birchwood injection which is scheduled for 02/19/18. He has some hyperprolactinemia as evident in labs from 02/15/18 at 79.4 although appears to be asymptomatic.    Principal Problem: Schizophrenia, paranoid type (HCC)  Diagnosis:   Patient Active Problem List   Diagnosis Date Noted  . 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 Psychosis (HCC) [F29]   . Schizophrenia (HCC) [F20.9] 05/12/2016  . Cannabis use disorder, moderate, dependence (HCC) [F12.20] 05/10/2016   Total Time spent with patient: 25 minutes  Past Psychiatric History: See H&P  Past Medical History:  Past Medical History:  Diagnosis Date  . Depression   . Schizophrenia [facility])     Past Surgical History:  Procedure Laterality Date  . BACK SURGERY     Family History: History reviewed. No pertinent family history.  Family Psychiatric  History: See H&P  Social History:  Social History   Substance and Sexual Activity  Alcohol Use No     Social History   Substance and Sexual Activity  Drug Use Yes  . Types: Marijuana   Comment: rare    Social History   Socioeconomic History   . Marital status: Single    Spouse name: Not on file  . Number of children: Not on file  . Years of education: Not on file  . Highest education level: Not on file  Occupational History  . Not on file  Social Needs  . Financial resource strain: Not on file  . Food insecurity:    Worry: Not on file    Inability: Not on file  . Transportation needs:    Medical: Not on file    Non-medical: Not on file  Tobacco Use  . Smoking status: Current Some Day Smoker  . Smokeless tobacco: Never Used  Substance and Sexual Activity  . Alcohol use: No  . Drug use: Yes    Types: Marijuana    Comment: rare  . Sexual activity: Yes    Birth control/protection: Condom  Lifestyle  . Physical activity:    Days per week: Not on file    Minutes per session: Not on file  . Stress: Not on file  Relationships  . Social connections:    Talks on phone: Not on file    Gets together: Not on file    Attends religious service: Not on file    Active member of club or organization: Not on file    Attends meetings of clubs or organizations: Not on file    Relationship status: Not on file  Other Topics Concern  . Not on file  Social History Narrative  . Not on file   Additional Social History:  Pain Medications: see PTA meds Prescriptions: see PTA meds Over the Counter: see PTA meds History of alcohol / drug use?: Yes Longest period of sobriety (when/how long): Unknown Name of Substance 1: Cannabis per record review 1 - Age of First Use: Unknown 1 - Amount (size/oz): Unknown 1 - Frequency: Unknown 1 - Duration: Unknown 1 - Last Use / Amount: Unknown  Sleep: Good  Appetite:  Good  Current Medications: Current Facility-Administered Medications  Medication Dose Route Frequency Provider Last Rate Last Dose  . acetaminophen (TYLENOL) tablet 650 mg  650 mg Oral Q6H PRN Truman Hayward, FNP      . albuterol (PROVENTIL HFA;VENTOLIN HFA) 108 (90 Base) MCG/ACT inhaler 2 puff  2 puff Inhalation  Q4H PRN Truman Hayward, FNP   2 puff at 02/14/18 2309  . alum & mag hydroxide-simeth (MAALOX/MYLANTA) 200-200-20 MG/5ML suspension 30 mL  30 mL Oral Q4H PRN Starkes, Takia S, FNP      . benztropine (COGENTIN) tablet 1 mg  1 mg Oral BID PRN Truman Hayward, FNP      . hydrOXYzine (ATARAX/VISTARIL) tablet 50 mg  50 mg Oral TID PRN Micheal Likens, MD   50 mg at 02/17/18 2037  . loratadine (CLARITIN) tablet 10 mg  10 mg Oral Daily Truman Hayward, FNP   10 mg at 02/18/18 0854  . LORazepam (ATIVAN) tablet 1 mg  1 mg Oral Q6H PRN Micheal Likens, MD   1 mg at 02/18/18 1035   Or  . LORazepam (ATIVAN) injection 1 mg  1 mg Intramuscular Q6H PRN Micheal Likens, MD   1 mg at 02/15/18 1515  . magnesium hydroxide (MILK OF MAGNESIA) suspension 30 mL  30 mL Oral Daily PRN Darcella Gasman, Takia S, FNP      . montelukast (SINGULAIR) tablet 10 mg  10 mg Oral QHS Truman Hayward, FNP   10 mg at 02/17/18 2037  . OLANZapine zydis (ZYPREXA) disintegrating tablet 10 mg  10 mg Oral Q8H PRN Micheal Likens, MD   10 mg at 02/17/18 0932   Or  . ziprasidone (GEODON) injection 20 mg  20 mg Intramuscular Q12H PRN Micheal Likens, MD   20 mg at 02/15/18 1515  . paliperidone (INVEGA SUSTENNA) injection 156 mg  156 mg Intramuscular Q30 days Micheal Likens, MD   156 mg at 02/15/18 1030  . paliperidone (INVEGA) 24 hr tablet 6 mg  6 mg Oral QHS Truman Hayward, FNP   6 mg at 02/17/18 2037  . traZODone (DESYREL) tablet 100 mg  100 mg Oral QHS PRN Truman Hayward, FNP   100 mg at 02/17/18 2037   Lab Results:  No results found for this or any previous visit (from the past 48 hour(s)). Blood Alcohol level:  Lab Results  Component Value Date   ETH <10 02/15/2018   ETH <10 01/28/2018   Metabolic Disorder Labs: Lab Results  Component Value Date   HGBA1C 5.2 05/12/2016   Lab Results  Component Value Date   PROLACTIN 79.4 (H) 02/15/2018   PROLACTIN 30.5 (H) 05/12/2016    Lab Results  Component Value Date   CHOL 176 05/12/2016   TRIG 312 (H) 05/12/2016   HDL 55 05/12/2016   CHOLHDL 3.2 05/12/2016   VLDL 62 (H) 05/12/2016   LDLCALC 59 05/12/2016   Physical Findings: AIMS: Facial and Oral Movements Muscles of Facial Expression: None, normal Lips and Perioral Area: None, normal Jaw: None, normal Tongue: None,  normal,Extremity Movements Upper (arms, wrists, hands, fingers): None, normal Lower (legs, knees, ankles, toes): None, normal, Trunk Movements Neck, shoulders, hips: None, normal, Overall Severity Severity of abnormal movements (highest score from questions above): None, normal Incapacitation due to abnormal movements: None, normal Patient's awareness of abnormal movements (rate only patient's report): No Awareness, Dental Status Current problems with teeth and/or dentures?: No Does patient usually wear dentures?: No  CIWA:  CIWA-Ar Total: 0 COWS:  COWS Total Score: 1  Musculoskeletal: Strength & Muscle Tone: within normal limits Gait & Station: normal Patient leans: N/A  Psychiatric Specialty Exam: Physical Exam  Nursing note and vitals reviewed.   Review of Systems  Psychiatric/Behavioral: Positive for depression and substance abuse (Hx. Cannabis use disorder). Negative for hallucinations, memory loss and suicidal ideas. The patient is not nervous/anxious and does not have insomnia.   All other systems reviewed and are negative.   Blood pressure 127/87, pulse 89, temperature 98.1 F (36.7 C), temperature source Oral, resp. rate 18, height  (1.651 m), weight 74.8 kg (165 lb), SpO2 100 %.Body mass index is 27.46 kg/m.  General Appearance: Casual, fairly groomed  Eye Contact:  Fair  Speech:  Clear and Coherent  Volume:  Normal, variable  Mood: Euthymic  Affect:  Appropriate, Congruent  Thought Process:  Coherent and Goal Directed  Orientation:  Full (Time, Place, and Person)  Thought Content:  Focused on discharge plans, no  evidence of hallucinations today  Suicidal Thoughts:  Denies  Homicidal Thoughts:  Denies  Memory:  Immediate;   Good Recent;   Fair Remote;   Fair  Judgement:  Poor  Insight:  Lacking  Psychomotor Activity:  Normal  Concentration:  Concentration: Fair  Recall:  [employer] of Knowledge:  Fair  Language:  Fair  Akathisia:  No  Handed:    AIMS (if indicated):     Assets:  Housing Physical Health Resilience Social Support  ADL's:  Intact  Cognition:  WNL  Sleep: 6.5       Treatment Plan Summary: Daily contact with patient to assess and evaluate symptoms and progress in treatment.  - Continue inpatient hospitalization.  - Will continue today 02/18/2018 plan as below except where it is noted (no changes today on 02/18/18 as pt is progressing well).  - Mood control.       - Continue paliperidone (Invega) 6 mg po daily.       - Continue paliperidone (Invega SUSTENNa) IN Q 30 days (administered 02-15-18).  - EPS.      - Continue Cogentin 1 mg po bid prn.  - Anxiety.      - Continue Hydroxyzine 50 mg po tid prn.  - Insomnia.      - Continue Trazodone 100 mg po Q hs prn.  - Nicotine withdrawal.       - Continue Nicoderm patch 21 mg topically Q 24 hours.  - Agitation/psychosis.      - Continue Olanzapine Zydis (Zyprexa) 10 mg po Q 8 hrs prn.         Or      - Ziprasidone (Geodone) 20 mg IM Q 12 hours prn.  - Other medical issues.     Asthma: Continue Montelukast (singulair) 10 mg po daily.                   - Continue Albuterol inhaler 109 (90 Base) MCG/ACT 2 puffs Every 4 hours prn For SOB.  - Patient to continue to attend & participate  in group milieu.  - Discharge disposition plan ongoing with probable discharge tomorrow 02/19/18.   Beau Fanny, FNP, FNP-BC 02/18/2018, 12:45 PM  Patient ID: [name], male   DOB: [birth year], 27 y.o.   MRN: [id number]

## 2018-02-18 NOTE — [facility] Group Notes (Signed)
Patient did not attend Orientation and Goals Group.  

## 2018-02-18 NOTE — Plan of Care (Signed)
D: Pt denies SI/HI/AVH. Pt is pleasant and cooperative. Pt stated he was ready to leave and stated he was going to do what he needed to do not to come back here.   A: Pt was offered support and encouragement. Pt was given scheduled medications. Pt was encourage to attend groups. Q 15 minute checks were done for safety.   R:Pt attends groups and interacts well with peers and staff. Pt is taking medication. Pt receptive to treatment and safety maintained on unit.   Problem: Safety: Goal: Periods of time without injury will increase Outcome: Progressing   Problem: Education: Goal: Will be free of psychotic symptoms Outcome: Progressing   Problem: Coping: Goal: Coping ability will improve Outcome: Progressing   Problem: Self-Concept: Goal: Ability to identify factors that promote anxiety will improve Outcome: Progressing

## 2018-02-18 NOTE — Progress Notes (Signed)
Recreation Therapy Notes  Date: 5.13.19 Time: 0950 Location: [address] Dayroom  Group Topic: Goal Setting  Goal Area(s) Addresses:  Patient will be able to identify at least 3 life goals.  Patient will be able to identify benefit of investing in goals.  Patient will be able to identify benefit of setting life goals.   Intervention: Worksheet  Activity: Goal Planning.  Patients were to identify goals they wanted to accomplish in a week, month, year and 5 years.  Patients were to then identify any obstacles they would face, what they need to accomplish their goal and what they can start doing to reach their goals.  Education:  Discharge Planning, Coping Skills, Leisure Education   Education Outcome: Acknowledges Education/In Group Clarification Provided/Needs Additional Education  Clinical Observations: Pt did not attend group.    Polly Barner, LRT/CTRS         Eliam Snapp A 02/18/2018 11:12 AM 

## 2018-02-18 NOTE — Progress Notes (Signed)
Patient walking hallway, said "I'm freaking out.  I don't know where I'm out.  I'm supposed to be leaving tomorrow.  I don't know what to do." MHT/Nurse talking to patient.  Told him there is nothing to be afraid of at the hospital.  He is being taken care of and being watched.

## 2018-02-18 NOTE — [facility] Suicide Risk Assessment (Signed)
[facility] INPATIENT:  Family/Significant Other Suicide Prevention Education  Suicide Prevention Education:  Education Completed; [name], aunt, [phone], has been identified by the patient as the family member/significant other with whom the patient will be residing, and identified as the person(s) who will aid the patient in the event of a mental health crisis (suicidal ideations/suicide attempt).  With written consent from the patient, the family member/significant other has been provided the following suicide prevention education, prior to the and/or following the discharge of the patient.  The suicide prevention education provided includes the following:  Suicide risk factors  Suicide prevention and interventions  National Suicide Hotline telephone number  [facility] assessment telephone number  [facility] Emergency Assistance 911  [facility] and/or Residential Mobile Crisis Unit telephone number  Request made of family/significant other to:  Remove weapons (e.g., guns, rifles, knives), all items previously/currently identified as safety concern.  No guns in the home, per aunt.  Remove drugs/medications (over-the-counter, prescriptions, illicit drugs), all items previously/currently identified as a safety concern.  The family member/significant other verbalizes understanding of the suicide prevention education information provided.  The family member/significant other agrees to remove the items of safety concern listed above.  Aunt reports that pt disappeared on Tuesday and she has not known where he was.  CSW explained that we did not have her correct phone number and had been trying to reach her as well.  Pt keeps calling 911 and saying he can't breath.  She has not noticed problems with his schizophrenia recently but is concerned that he has been of his psych meds.  Pt smokes both cigarettes and marijuana, but likes to say he can't breath due to his psych  meds.  Pt can return to live with her and she will be available to pick him up at discharge.  Lorri Frederick, LCSW 02/18/2018, 9:19 AM

## 2018-02-18 NOTE — [facility] Group Notes (Signed)
[facility] LCSW Group Therapy Note  Date/Time: 02/18/18, 1315  Type of Therapy and Topic:  Group Therapy:  Overcoming Obstacles  Participation Level:  Did not attend  Description of Group:    In this group patients will be encouraged to explore what they see as obstacles to their own wellness and recovery. They will be guided to discuss their thoughts, feelings, and behaviors related to these obstacles. The group will process together ways to cope with barriers, with attention given to specific choices patients can make. Each patient will be challenged to identify changes they are motivated to make in order to overcome their obstacles. This group will be process-oriented, with patients participating in exploration of their own experiences as well as giving and receiving support and challenge from other group members.  Therapeutic Goals: 1. Patient will identify personal and current obstacles as they relate to admission. 2. Patient will identify barriers that currently interfere with their wellness or overcoming obstacles.  3. Patient will identify feelings, thought process and behaviors related to these barriers. 4. Patient will identify two changes they are willing to make to overcome these obstacles:    Summary of Patient Progress      Therapeutic Modalities:   Cognitive Behavioral Therapy Solution Focused Therapy Motivational Interviewing Relapse Prevention Therapy  [employer], LCSW

## 2018-02-18 NOTE — Progress Notes (Signed)
D:  Patient denied Si and HI, contracts for safety.  Denied A/V hallucinations. A:  Medications administered per MD orders.  Emotional support and encouragement given patient. R:  Safety maintained with 15 minute checks.  

## 2018-02-18 NOTE — Plan of Care (Signed)
Nurse discussed depression, anxiety and coping skills with patient.  

## 2018-02-18 NOTE — Progress Notes (Signed)
Patient stated he is confused, scared, worried about discharge tomorrow.  MD informed.  Patient resting in dayroom and tv on.  Respirations even and unlabored.  No signs/symptoms of pain/distress noted on patient's face/body movements.  Safety maintained with 15 minute checks.

## 2018-02-19 DIAGNOSIS — F1490 Cocaine use, unspecified, uncomplicated: Secondary | ICD-10-CM

## 2018-02-19 DIAGNOSIS — F17200 Nicotine dependence, unspecified, uncomplicated: Secondary | ICD-10-CM

## 2018-02-19 MED ORDER — MONTELUKAST SODIUM 10 MG PO TABS: 10.0000 mg | ORAL_TABLET | Freq: Every day | ORAL | Status: DC

## 2018-02-19 MED ORDER — ALBUTEROL SULFATE HFA 108 (90 BASE) MCG/ACT IN AERS: 2.0000 | INHALATION_SPRAY | RESPIRATORY_TRACT | Status: DC | PRN

## 2018-02-19 MED ORDER — LORATADINE 10 MG PO TABS: 10.0000 mg | ORAL_TABLET | Freq: Every day | ORAL | Status: DC

## 2018-02-19 MED ORDER — PALIPERIDONE ER 6 MG PO TB24: 6.0000 mg | ORAL_TABLET | Freq: Every day | ORAL | 0 refills | Status: DC

## 2018-02-19 MED ORDER — TRAZODONE HCL 100 MG PO TABS: 100.0000 mg | ORAL_TABLET | Freq: Every evening | ORAL | 0 refills | Status: DC | PRN

## 2018-02-19 MED ORDER — BENZTROPINE MESYLATE 1 MG PO TABS: 1.0000 mg | ORAL_TABLET | Freq: Two times a day (BID) | ORAL | 0 refills | Status: DC | PRN

## 2018-02-19 MED ORDER — PALIPERIDONE PALMITATE ER 156 MG/ML IM SUSY: 156.0000 mg | PREFILLED_SYRINGE | INTRAMUSCULAR | 0 refills | Status: DC

## 2018-02-19 MED ORDER — HYDROXYZINE HCL 50 MG PO TABS: 50.0000 mg | ORAL_TABLET | Freq: Three times a day (TID) | ORAL | 0 refills | Status: DC | PRN

## 2018-02-19 NOTE — [facility] Suicide Risk Assessment (Signed)
[facility] Discharge Suicide Risk Assessment   Principal Problem: Schizophrenia, paranoid type [name]) Discharge Diagnoses:  Patient Active Problem List   Diagnosis Date Noted  . 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 Psychosis (HCC) [F29]   . Schizophrenia (HCC) [F20.9] 05/12/2016  . Cannabis use disorder, moderate, dependence (HCC) [F12.20] 05/10/2016    Total Time spent with patient: 30 minutes  Musculoskeletal: Strength & Muscle Tone: within normal limits Gait & Station: normal Patient leans: N/A  Psychiatric Specialty Exam: Review of Systems  Constitutional: Negative for chills and fever.  Respiratory: Negative for cough and shortness of breath.   Cardiovascular: Negative for chest pain.  Gastrointestinal: Negative for abdominal pain, heartburn, nausea and vomiting.  Psychiatric/Behavioral: Negative for depression, hallucinations and suicidal ideas. The patient is not nervous/anxious and does not have insomnia.     Blood pressure (!) 111/52, pulse (!) 114, temperature 98.2 F (36.8 C), temperature source Oral, resp. rate 16, height  (1.651 m), weight 74.8 kg (165 lb), SpO2 100 %.Body mass index is 27.46 kg/m.  General Appearance: Casual and Fairly Groomed  Patent attorney::  Good  Speech:  Clear and Coherent and Normal Rate  Volume:  Normal  Mood:  Euthymic  Affect:  Congruent and Flat  Thought Process:  Coherent and Goal Directed  Orientation:  Full (Time, Place, and Person)  Thought Content:  Logical  Suicidal Thoughts:  No  Homicidal Thoughts:  No  Memory:  Immediate;   Fair Recent;   Fair Remote;   Fair  Judgement:  Fair  Insight:  Fair  Psychomotor Activity:  Normal  Concentration:  Fair  Recall:  [employer] of Knowledge:Fair  Language: Fair  Akathisia:  No  Handed:    AIMS (if indicated):     Assets:  Communication Skills Resilience Social Support  Sleep:  Number of Hours: 6.5  Cognition: WNL  ADL's:  Intact   Mental Status Per  Nursing Assessment::   On Admission:     Demographic Factors:  Male, Low socioeconomic status and Unemployed  Loss Factors: Financial problems/change in socioeconomic status  Historical Factors: Impulsivity  Risk Reduction Factors:   Positive social support, Positive therapeutic relationship and Positive coping skills or problem solving skills  Continued Clinical Symptoms:  Severe Anxiety and/or Agitation Schizophrenia:   Less than 86 years old Paranoid or undifferentiated type  Cognitive Features That Contribute To Risk:  None    Suicide Risk:  Minimal: No identifiable suicidal ideation.  Patients presenting with no risk factors but with morbid ruminations; may be classified as minimal risk based on the severity of the depressive symptoms  Follow-up Information    [facility] Follow up on 02/21/2018.   Specialty:  Behavioral Health Why:  [facility] follow-up on Thursday, 02/21/18 at 8:15AM. Please bring photo ID and Medicaid card to this appt. Thank you.  Contact information: [address] [place] [address] [phone]        [facility], [place]. Go on 02/26/2018.   Why:  Please attend your intake appt on Tuesday, 02/26/18, at 4:00pm. Contact information: [address] [place] [address] [phone]         Subjective Data:  [name] is a 27 y/o M with history of schizophrenia who was admitted voluntarily to [facility] after brought in by EMS as per his request, after he reported that he was hearing voices and having paranoia. Upon initial presentation to [facility] he reported paranoia and AH of a voice. Pt has recent  relevant history of admission to [facility] last month with discharge on 01/22/18. Since that time pt has had multiple presentations to [facility] for symptoms of paranoia and anxiety in the context of substance use of cannabis and cocaine. He was restarted on oral form of Invega upon admission, and he was due for next injection of [place] 156IM q30 days  within a few days of admission, so that was administered during his stay. Pt reported improvement of his presenting symptoms.  Today upon evaluation, pt shares, "I'm doing good. When do I get to go home?" He denies any specific concerns. He is sleeping well. His appetite is good. He denies SI/HI/AH/VH. He denies paranoia. He is tolerating his medications well, and he denies any side effects. He is in agreement to continue his current regimen without changes, and he will follow up at [facility]. He plans to return to staying with his aunt after discharge. He was able to engage in safety planning including plan to return to [facility] or contact emergency services if he feels unable to maintain his own safety or the safety of others. Pt had no further questions, comments, or concerns.   Plan Of Care/Follow-up recommendations:   - Discharge to outpatient level of care  -Schizophrenia       - Continue paliperidone (Invega) 6 mg po daily.       - Continue paliperidone Hinda Glatter Lorelei Pont)  IM q30 days (administered 02-15-18).  - EPS.      - Continue Cogentin 1 mg po bid prn EPS.  - Anxiety.      - Continue Hydroxyzine 50 mg po tid prn anxiety.  - Insomnia.      - Continue Trazodone 100 mg po Q hs prn insomnia.  - Other medical issues.     Asthma: Continue Montelukast (singulair) 10 mg po daily.                   - Continue Albuterol inhaler 109 (90 Base) MCG/ACT 2 puffs Every 4 hours prn For SOB.  Activity:  as tolerated Diet:  normal Tests:  NA Other:  see above for DC plan  Micheal Likens, MD 02/19/2018, 9:13 AM

## 2018-02-19 NOTE — Progress Notes (Signed)
  [facility] Adult Case Management Discharge Plan :  Will you be returning to the same living situation after discharge:  Yes,  with aunt At discharge, do you have transportation home?: Yes,  aunt Do you have the ability to pay for your medications: Yes,  medicaid  Release of information consent forms completed and in the chart;  Patient's signature needed at discharge.  Patient to Follow up at: Follow-up Information    [facility] Follow up on 02/21/2018.   Specialty:  Behavioral Health Why:  Hospital follow-up on Thursday, 02/21/18 at 8:15AM. Please bring photo ID and Medicaid card to this appt. Thank you.  Contact information: [address] [place] [address] [phone]        [facility], [place]. Go on 02/26/2018.   Why:  Please attend your intake appt on Tuesday, 02/26/18, at 4:00pm. Contact information: [address] [place] [address] [phone]           Next level of care provider has access to [facility] Link:no  Safety Planning and Suicide Prevention discussed: Yes,  with aunt  Have you used any form of tobacco in the last 30 days? (Cigarettes, Smokeless Tobacco, Cigars, and/or Pipes): Yes  Has patient been referred to the Quitline?: Yes, faxed on 02/19/18  Patient has been referred for addiction treatment: Yes  Lorri Frederick, LCSW 02/19/2018, 9:27 AM

## 2018-02-19 NOTE — Plan of Care (Signed)
Patient is interacting appropriately in dayroom.  Problem: Activity: Goal: Interest or engagement in activities will improve Outcome: Progressing

## 2018-02-19 NOTE — Progress Notes (Signed)
Recreation Therapy Notes  Date: 5.14.19 Time: 0950 Location: 500 Hall Dayroom  Group Topic: Coping Skills  Goal Area(s) Addresses:  Patient will be able to identify positive coping skills. Patients will be able to identify benefits of using coping skills post d/c.  Intervention: Scissors, glue, magazines, worksheet  Activity: Coping Skills Collage.  Patients were given [facility] worksheet that was divided into 5 categories (diversions, social, cognitive, tension releasers and physical).  Patients were to find pictures from the magazines that represent each category that could be used as coping skills.  Education: Coping Skills, Discharge Planning.   Education Outcome: Acknowledges understanding/In group clarification offered/Needs additional education.   Clinical Observations/Feedback: Pt did not attend group.    [name], LRT/CTRS         [name] 02/19/2018 11:09 AM 

## 2018-02-19 NOTE — Progress Notes (Signed)
Pt d/c from the hospital with his aunt. All items returned. D/C instructions given and prescriptions given. Pt denies si and hi.

## 2018-02-19 NOTE — Progress Notes (Signed)
D: Patient presents pleasant, but guarded this morning and states he is ready to go home. Patient denied Si and HI, contracts for safety.  Denied A/V hallucinations.  A:  Medications administered per MD orders. Patient compliant with medications. Emotional support and encouragement given patient. R:  Safety maintained with 15 minute checks.

## 2018-02-19 NOTE — Progress Notes (Signed)
Recreation Therapy Notes  INPATIENT RECREATION TR PLAN  Patient Details Name: [name] MRN: [id number] DOB: [birth year] Today's Date: 02/19/2018  Rec Therapy Plan Is patient appropriate for Therapeutic Recreation?: Yes Treatment times per week: At least 3 Estimated Length of Stay: 5-7 days TR Treatment/Interventions: Group participation (Comment)  Discharge Criteria Pt will be discharged from therapy if:: Discharged Treatment plan/goals/alternatives discussed and agreed upon by:: Patient/family  Discharge Summary Short term goals set: See care plan Short term goals met: Not met Reason goals not met: Pt did not attend groups Therapeutic equipment acquired: None Reason patient discharged from therapy: Discharge from hospital Pt/family agrees with progress & goals achieved: Yes Date patient discharged from therapy: 02/19/18     Victorino Sparrow, LRT/CTRS  Ria Comment, Ayauna Mcnay A 02/19/2018, 11:15 AM

## 2018-02-19 NOTE — Discharge Summary (Addendum)
Physician Discharge Summary Note  Patient:  [name] is an 27 y.o., male  MRN:  [id number]  DOB:  [birth year]  Patient phone:  [phone] (home)   Patient address:   [address] [place] [address],   Total Time spent with patient: Greater than 30 minutes  Date of Admission:  02/14/2018  Date of Discharge: 02/18/18  Reason for Admission: Hearing voices and having paranoia. Upon initial presentation to [facility] he reported paranoia and AH of a voice.    Principal Problem: Schizophrenia, paranoid type [facility])  Discharge Diagnoses: Patient Active Problem List   Diagnosis Date Noted  . 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 Psychosis (HCC) [F29]   . Schizophrenia (HCC) [F20.9] 05/12/2016  . Cannabis use disorder, moderate, dependence (HCC) [F12.20] 05/10/2016   Past Psychiatric History: Schizophrenia. He is being followed up at [facility].   Past Medical History:      Past Medical History:  Diagnosis Date  . Depression          Past Surgical History:  Procedure Laterality Date  . BACK SURGERY     Family History: History reviewed. No pertinent family history.  Family Psychiatric  History: Unable to obtain at this time  Tobacco Screening: Have you used any form of tobacco in the last 30 days? (Cigarettes, Smokeless Tobacco, Cigars, and/or Pipes): No  Social History: Unable to obtain at this time    History  Alcohol Use No          History  Drug Use  . Types: Marijuana    Comment: rare   Social History:  Social History   Substance and Sexual Activity  Alcohol Use No     Social History   Substance and Sexual Activity  Drug Use Yes  . Types: Marijuana   Comment: rare    Social History   Socioeconomic History  . Marital status: Single    Spouse name: Not on file  . Number of children: Not on file  . Years of education: Not on file  . Highest education level: Not on file  Occupational History  . Not on file  Social Needs  .  Financial resource strain: Not on file  . Food insecurity:    Worry: Not on file    Inability: Not on file  . Transportation needs:    Medical: Not on file    Non-medical: Not on file  Tobacco Use  . Smoking status: Current Some Day Smoker  . Smokeless tobacco: Never Used  Substance and Sexual Activity  . Alcohol use: No  . Drug use: Yes    Types: Marijuana    Comment: rare  . Sexual activity: Yes    Birth control/protection: Condom  Lifestyle  . Physical activity:    Days per week: Not on file    Minutes per session: Not on file  . Stress: Not on file  Relationships  . Social connections:    Talks on phone: Not on file    Gets together: Not on file    Attends religious service: Not on file    Active member of club or organization: Not on file    Attends meetings of clubs or organizations: Not on file    Relationship status: Not on file  Other Topics Concern  . Not on file  Social History Narrative  . Not on file   Hospital Course:  (Per Md's discharge SRA): [name] is a 27 y/o M with history of schizophrenia who  was admitted voluntarily to [facility] after brought in by EMS as per his request, after he reported that he was hearing voices and having paranoia. Upon initial presentation to [facility] he reported paranoia and AH of a voice. Pt has recent relevant history of admission to [facility] last month with discharge on 01/22/18. Since that time pt has had multiple presentations to [facility] for symptoms of paranoia and anxiety in the context of substance use of cannabis and cocaine. He was restarted on oral form of Invega upon admission, and he was due for next injection of [place] 156IM q30 days within a few days of admission, so that was administered during his stay. Pt reported improvement of his presenting symptoms.  Besides the use of paliperidone injectable 156 mg/ml IM for mood control & paliperidone tablets 6 mg po for mood control, [name] was also medicated & discharged on;  Cogentin 1 mg for EPS. Hydroxyzine 50 mg prn for anxiety & Trazodone 100 mg for insomnia. He was enrolled in the group counseling sessions being offered & held on this unit. He declined to attend citing, he does not like to be around people. He was resumed & discharged on all his pertinent home medications for the other pre-existing medical issues presented. He tolerated his treatment regimen without any adverse effects or reactions reported.   Today upon his discharge evaluation, pt shares, "I'm doing good. When do I get to go home?" He denies any specific concerns. He is sleeping well. His appetite is good. He denies SI/HI/AH/VH. He denies paranoia. He is tolerating his medications well, and he denies any side effects. He is in agreement to continue his current regimen without changes, and he will follow up at [facility]. He plans to return to staying with his aunt after discharge. He was able to engage in safety planning including plan to return to [facility] or contact emergency services if he feels unable to maintain his own safety or the safety of others. Pt had no further questions, comments, or concerns.  Upon discharge, [name] presents mentally & medically stable. He is to continue mental health care on an outpatient basis as noted below. He was provided with all the necessary information needed to make this appointment without problems. He left [facility] with all personal belongs in no apparent distress. Transportation per aunt.  Physical Findings: AIMS: Facial and Oral Movements Muscles of Facial Expression: None, normal Lips and Perioral Area: None, normal Jaw: None, normal Tongue: None, normal,Extremity Movements Upper (arms, wrists, hands, fingers): None, normal Lower (legs, knees, ankles, toes): None, normal, Trunk Movements Neck, shoulders, hips: None, normal, Overall Severity Severity of abnormal movements (highest score from questions above): None, normal Incapacitation due to abnormal movements:  None, normal Patient's awareness of abnormal movements (rate only patient's report): No Awareness, Dental Status Current problems with teeth and/or dentures?: No Does patient usually wear dentures?: No  CIWA:  CIWA-Ar Total: 1 COWS:  COWS Total Score: 2  Musculoskeletal: Strength & Muscle Tone: within normal limits Gait & Station: normal Patient leans: N/A  Psychiatric Specialty Exam: Physical Exam  Nursing note and vitals reviewed. Constitutional: He appears well-developed.  HENT:  Head: Normocephalic.  Eyes: Pupils are equal, round, and reactive to light.  Neck: Normal range of motion.  Cardiovascular: Normal rate.  Respiratory: Effort normal.  GI: Soft.  Genitourinary:  Genitourinary Comments: Deferred  Musculoskeletal: Normal range of motion.  Neurological: He is alert.  Skin: Skin is warm.    Review of Systems  Constitutional: Negative.  HENT: Negative.   Eyes: Negative.   Respiratory: Negative.   Cardiovascular: Negative.   Gastrointestinal: Negative.   Genitourinary: Negative.   Musculoskeletal: Negative.   Skin: Negative.   Neurological: Negative.   Endo/Heme/Allergies: Negative.   Psychiatric/Behavioral: Positive for depression (Stable), hallucinations (Hx. Psychosis, paranoia (stable)) and substance abuse (Hx. THC use disorder). Negative for memory loss and suicidal ideas. The patient has insomnia (Stable). The patient is not nervous/anxious.     Blood pressure (!) 111/52, pulse (!) 114, temperature 98.2 F (36.8 C), temperature source Oral, resp. rate 16, height  (1.651 m), weight 74.8 kg (165 lb), SpO2 100 %.Body mass index is 27.46 kg/m.  See H&P   Have you used any form of tobacco in the last 30 days? (Cigarettes, Smokeless Tobacco, Cigars, and/or Pipes): Yes  Has this patient used any form of tobacco in the last 30 days? (Cigarettes, Smokeless Tobacco, Cigars, and/or Pipes): N/A  Blood Alcohol level:  Lab Results  Component Value Date   ETH  <10 02/15/2018   ETH <10 01/28/2018   Metabolic Disorder Labs:  Lab Results  Component Value Date   HGBA1C 5.2 05/12/2016   Lab Results  Component Value Date   PROLACTIN 79.4 (H) 02/15/2018   PROLACTIN 30.5 (H) 05/12/2016   Lab Results  Component Value Date   CHOL 176 05/12/2016   TRIG 312 (H) 05/12/2016   HDL 55 05/12/2016   CHOLHDL 3.2 05/12/2016   VLDL 62 (H) 05/12/2016   LDLCALC 59 05/12/2016   See Psychiatric Specialty Exam and Suicide Risk Assessment completed by Attending Physician prior to discharge.  Discharge destination:  Home  Is patient on multiple antipsychotic therapies at discharge: No,   Do you recommend tapering to monotherapy for antipsychotics?  NA    Has Patient had three or more failed trials of antipsychotic monotherapy by history:  No  Recommended Plan for Multiple Antipsychotic Therapies: NA  Allergies as of 02/19/2018      Reactions   Shellfish Allergy Hives, Itching      Medication List    STOP taking these medications   cetirizine-pseudoephedrine 5-120 MG tablet Commonly known as:  ZYRTEC-D   paliperidone 156 MG/ML Susp injection Commonly known as:  INVEGA SUSTENNA Replaced by:  paliperidone 156 MG/ML Susy injection   predniSONE 20 MG tablet Commonly known as:  DELTASONE     TAKE these medications     Indication  albuterol 108 (90 Base) MCG/ACT inhaler Commonly known as:  PROVENTIL HFA;VENTOLIN HFA Inhale 2 puffs into the lungs every 4 (four) hours as needed for wheezing or shortness of breath (cough).  Indication:  Asthma   benztropine 1 MG tablet Commonly known as:  COGENTIN Take 1 tablet (1 mg total) by mouth 2 (two) times daily as needed (EPS).  Indication:  Extrapyramidal Reaction caused by Medications   hydrOXYzine 50 MG tablet Commonly known as:  ATARAX/VISTARIL Take 1 tablet (50 mg total) by mouth 3 (three) times daily as needed (mild anxiety). What changed:    medication strength  how much to take  reasons  to take this  Indication:  Feeling Anxious   loratadine 10 MG tablet Commonly known as:  CLARITIN Take 1 tablet (10 mg total) by mouth daily. For allergies Start taking on:  02/20/2018  Indication:  Perennial Allergic Rhinitis, Hayfever   montelukast 10 MG tablet Commonly known as:  SINGULAIR Take 1 tablet (10 mg total) by mouth at bedtime. For Asthma  Indication:  Asthma   paliperidone 156  MG/ML Susy injection Commonly known as:  INVEGA SUSTENNA Inject 1 mL (156 mg total) into the muscle every 30 (thirty) days. (Due on 03-17-18): For mood stabilization Start taking on:  03/17/2018 Replaces:  paliperidone 156 MG/ML Susp injection  Indication:  Schizophrenia, Mood control   paliperidone 6 MG 24 hr tablet Commonly known as:  INVEGA Take 1 tablet (6 mg total) by mouth at bedtime. For mood control  Indication:  Schizophrenia, Mood control   traZODone 100 MG tablet Commonly known as:  DESYREL Take 1 tablet (100 mg total) by mouth at bedtime as needed for sleep.  Indication:  Trouble Sleeping      Follow-up Information    [facility] Follow up on 02/21/2018.   Specialty:  Behavioral Health Why:  Hospital follow-up on Thursday, 02/21/18 at 8:15AM. Please bring photo ID and Medicaid card to this appt. Thank you.  Contact information: [address] [place] [address] [phone]        [facility], [place]. Go on 02/26/2018.   Why:  Please attend your intake appt on Tuesday, 02/26/18, at 4:00pm. Contact information: [address] [place] [address] [phone]          Follow-up care recommendation: Activity:  As tolerated Diet: As recommended by your primary care doctor. Keep all scheduled follow-up appointments as recommended.  Comments: Patient is instructed prior to discharge to: Take all medications as prescribed by his/her mental healthcare provider. Report any adverse effects and or reactions from the medicines to his/her outpatient provider  promptly. Patient has been instructed & cautioned: To not engage in alcohol and or illegal drug use while on prescription medicines. In the event of worsening symptoms, patient is instructed to call the crisis hotline, 911 and or go to the nearest ED for appropriate evaluation and treatment of symptoms. To follow-up with his/her primary care provider for your other medical issues, concerns and or health care needs.   Signed: Armandina Stammer, NP, PMHNP, FNP-BC 02/19/2018, 9:41 AM    Patient seen, Suicide Assessment Completed.  Disposition Plan Reviewed

## 2018-02-19 NOTE — Plan of Care (Signed)
Pt did not attend groups.   Tyjae Issa, LRT/CTRS 

## 2018-02-21 ENCOUNTER — Encounter (HOSPITAL_COMMUNITY): Payer: Self-pay | Admitting: Emergency Medicine

## 2018-02-21 ENCOUNTER — Emergency Department (HOSPITAL_COMMUNITY)
Admission: EM | Admit: 2018-02-21 | Discharge: 2018-02-21 | Disposition: A | Discharge status: Home / Self Care | Payer: Medicaid Other | Attending: Emergency Medicine | Admitting: Emergency Medicine

## 2018-02-21 DIAGNOSIS — R0602 Shortness of breath: Secondary | ICD-10-CM | POA: Insufficient documentation

## 2018-02-21 DIAGNOSIS — F17210 Nicotine dependence, cigarettes, uncomplicated: Secondary | ICD-10-CM | POA: Insufficient documentation

## 2018-02-21 DIAGNOSIS — F22 Delusional disorders: Secondary | ICD-10-CM | POA: Insufficient documentation

## 2018-02-21 DIAGNOSIS — Z79899 Other long term (current) drug therapy: Secondary | ICD-10-CM | POA: Insufficient documentation

## 2018-02-21 NOTE — Discharge Instructions (Addendum)
Call [facility] for further evaluation of feeling like someone is going to hurt you. Please come back here anytime you need urgent assistance.

## 2018-02-21 NOTE — ED Notes (Signed)
Patient verbalizes understanding of discharge instructions. Opportunity for questioning and answers were provided. Armband removed by staff, pt discharged from ED ambulatory with bus pass.  

## 2018-02-21 NOTE — ED Triage Notes (Signed)
  Patient was standing at a stop sign complaining of SOB.  [facility] transported patient to [facility] and patient was given  Albuterol, 0.5mg  Atrovent, and  solumedrol.  Patient has hx of bronchitis and asthma.  Bilateral wheezing heard on auscultation on arrival.  Patient is able to talk in short sentences.

## 2018-02-21 NOTE — ED Provider Notes (Signed)
[name] EMERGENCY DEPARTMENT Provider Note   CSN: [id number] Arrival date & time: 02/21/18  0457     History   Chief Complaint Chief Complaint  Patient presents with  . Shortness of Breath  . Respiratory Distress    HPI [name] is a 27 y.o. male.  Patient arrives with concern for SOB, wheezing. History of bronchitis per EMS. Given 10 mg Albuterol, 0.5 mg Atrovent, solumedrol IV and appears comfortable on arrival. On interview the patient does not provide details of history or able to state any concerns. He states he was SOB and that this is better. Repeatedly answers "I don't know" when asked questions.   The history is provided by the patient. No language interpreter was used.    Past Medical History:  Diagnosis Date  . Depression   . Schizophrenia [facility])     Patient Active Problem List   Diagnosis Date Noted  . Schizophrenia, paranoid type (HCC) 02/14/2018  . Psychosis (HCC)   . Schizophrenia (HCC) 05/12/2016  . Cannabis use disorder, moderate, dependence (HCC) 05/10/2016    Past Surgical History:  Procedure Laterality Date  . BACK SURGERY          Home Medications    Prior to Admission medications   Medication Sig Start Date End Date Taking? Authorizing Provider  albuterol (PROVENTIL HFA;VENTOLIN HFA) 108 (90 Base) MCG/ACT inhaler Inhale 2 puffs into the lungs every 4 (four) hours as needed for wheezing or shortness of breath (cough). 02/19/18   Armandina Stammer I, NP  benztropine (COGENTIN) 1 MG tablet Take 1 tablet (1 mg total) by mouth 2 (two) times daily as needed (EPS). 02/19/18   Nwoko, Nicole Kindred I, NP  hydrOXYzine (ATARAX/VISTARIL) 50 MG tablet Take 1 tablet (50 mg total) by mouth 3 (three) times daily as needed (mild anxiety). 02/19/18   Armandina Stammer I, NP  loratadine (CLARITIN) 10 MG tablet Take 1 tablet (10 mg total) by mouth daily. For allergies 02/20/18   Armandina Stammer I, NP  montelukast (SINGULAIR) 10 MG tablet Take 1 tablet (10 mg  total) by mouth at bedtime. For Asthma 02/19/18   Armandina Stammer I, NP  paliperidone (INVEGA SUSTENNA) 156 MG/ML SUSY injection Inject 1 mL (156 mg total) into the muscle every 30 (thirty) days. (Due on 03-17-18): For mood stabilization 03/17/18   Armandina Stammer I, NP  paliperidone (INVEGA) 6 MG 24 hr tablet Take 1 tablet (6 mg total) by mouth at bedtime. For mood control 02/19/18   Armandina Stammer I, NP  traZODone (DESYREL) 100 MG tablet Take 1 tablet (100 mg total) by mouth at bedtime as needed for sleep. 02/19/18   Sanjuana Kava, NP    Family History History reviewed. No pertinent family history.  Social History Social History   Tobacco Use  . Smoking status: Current Some Day Smoker  . Smokeless tobacco: Never Used  Substance Use Topics  . Alcohol use: No  . Drug use: Yes    Types: Marijuana    Comment: rare     Allergies   Shellfish allergy   Review of Systems Review of Systems  Respiratory: Positive for shortness of breath and wheezing.        Resolved     Physical Exam Updated Vital Signs BP 133/74 (BP Location: Right Arm)   Pulse 79   Temp 97.6 F (36.4 C) (Oral)   Resp 18   SpO2 99%   Physical Exam  Constitutional: He appears well-developed and well-nourished.  HENT:  Head: Normocephalic.  Neck: Normal range of motion. Neck supple.  Cardiovascular: Normal rate and regular rhythm.  Pulmonary/Chest: Effort normal and breath sounds normal.  Abdominal: Soft. Bowel sounds are normal. There is no tenderness. There is no rebound and no guarding.  Musculoskeletal: Normal range of motion.  Neurological: He is alert. No cranial nerve deficit.  Skin: Skin is warm and dry. No rash noted.  Psychiatric: His affect is blunt. His speech is delayed. He is withdrawn. He is noncommunicative.     ED Treatments / Results  Labs (all labs ordered are listed, but only abnormal results are displayed) Labs Reviewed - No data to display  EKG None  Radiology No results  found.  Procedures Procedures (including critical care time)  Medications Ordered in ED Medications - No data to display   Initial Impression / Assessment and Plan / ED Course  I have reviewed the triage vital signs and the nursing notes.  Pertinent labs & imaging results that were available during my care of the patient were reviewed by me and considered in my medical decision making (see chart for details).     The patient is here by EMS c/o SOB, wheezing. Given albuterol/atrovent prior to arrival and appears comfortable.  He has a very flat affect. He does not answer questions and does not answer what he needs from ED evaluation. Does not cooperate on exam.  Per chart there is a history of schizophrenia. He admits to hearing voices but denies they are command voices. He states he feels people are trying to hurt him but denies wanting to hurt himself or others. He denies visual hallucinations. TTS considered to assist in evaluation but the patient requests to be discharged without psych assessment.   He is not aggressive, denies SI/HI. No basis for IVC petition identified. He is stable physically, ambulatory, has eaten. He was asked a second time if he wanted to stay for further evaluation and he declined. Discussed he could return any time for further assistance.  Final Clinical Impressions(s) / ED Diagnoses   Final diagnoses:  None   1. Paranoia 2. SOB, resolved  ED Discharge Orders    None       Danne Harbor 02/21/18 2231    Palumbo, April, MD 02/21/18 2320

## 2018-02-23 ENCOUNTER — Emergency Department (EMERGENCY_DEPARTMENT_HOSPITAL)
Admission: EM | Admit: 2018-02-23 | Discharge: 2018-02-24 | Disposition: A | Discharge status: Home / Self Care | Payer: Medicaid Other | Source: Home / Self Care | Attending: Emergency Medicine | Admitting: Emergency Medicine

## 2018-02-23 ENCOUNTER — Emergency Department (HOSPITAL_COMMUNITY)
Admission: EM | Admit: 2018-02-23 | Discharge: 2018-02-23 | Disposition: A | Discharge status: Home / Self Care | Payer: Medicaid Other | Attending: Emergency Medicine | Admitting: Emergency Medicine

## 2018-02-23 ENCOUNTER — Emergency Department (HOSPITAL_COMMUNITY): Payer: Medicaid Other

## 2018-02-23 ENCOUNTER — Encounter (HOSPITAL_COMMUNITY): Payer: Self-pay | Admitting: Emergency Medicine

## 2018-02-23 ENCOUNTER — Other Ambulatory Visit: Payer: Self-pay

## 2018-02-23 DIAGNOSIS — R0602 Shortness of breath: Secondary | ICD-10-CM

## 2018-02-23 DIAGNOSIS — Z0489 Encounter for examination and observation for other specified reasons: Secondary | ICD-10-CM | POA: Insufficient documentation

## 2018-02-23 DIAGNOSIS — F200 Paranoid schizophrenia: Secondary | ICD-10-CM | POA: Diagnosis not present

## 2018-02-23 DIAGNOSIS — Z79899 Other long term (current) drug therapy: Secondary | ICD-10-CM

## 2018-02-23 DIAGNOSIS — R443 Hallucinations, unspecified: Secondary | ICD-10-CM

## 2018-02-23 DIAGNOSIS — G4700 Insomnia, unspecified: Secondary | ICD-10-CM | POA: Diagnosis not present

## 2018-02-23 DIAGNOSIS — Z5321 Procedure and treatment not carried out due to patient leaving prior to being seen by health care provider: Secondary | ICD-10-CM | POA: Insufficient documentation

## 2018-02-23 DIAGNOSIS — G259 Extrapyramidal and movement disorder, unspecified: Secondary | ICD-10-CM | POA: Diagnosis not present

## 2018-02-23 DIAGNOSIS — F1220 Cannabis dependence, uncomplicated: Secondary | ICD-10-CM | POA: Diagnosis present

## 2018-02-23 DIAGNOSIS — F1210 Cannabis abuse, uncomplicated: Secondary | ICD-10-CM | POA: Diagnosis not present

## 2018-02-23 DIAGNOSIS — F17210 Nicotine dependence, cigarettes, uncomplicated: Secondary | ICD-10-CM

## 2018-02-23 DIAGNOSIS — F209 Schizophrenia, unspecified: Secondary | ICD-10-CM

## 2018-02-23 DIAGNOSIS — F2089 Other schizophrenia: Secondary | ICD-10-CM | POA: Diagnosis present

## 2018-02-23 LAB — RAPID URINE DRUG SCREEN, HOSP PERFORMED
Amphetamines: NOT DETECTED
Barbiturates: NOT DETECTED
Benzodiazepines: NOT DETECTED
Cocaine: NOT DETECTED
Opiates: NOT DETECTED
Tetrahydrocannabinol: POSITIVE — AB

## 2018-02-23 LAB — COMPREHENSIVE METABOLIC PANEL
ALT: 12 U/L — ABNORMAL LOW (ref 17–63)
AST: 16 U/L (ref 15–41)
Albumin: 4.3 g/dL (ref 3.5–5.0)
Alkaline Phosphatase: 42 U/L (ref 38–126)
Anion gap: 11 (ref 5–15)
BUN: 21 mg/dL — ABNORMAL HIGH (ref 6–20)
CO2: 26 mmol/L (ref 22–32)
Calcium: 9.4 mg/dL (ref 8.9–10.3)
Chloride: 101 mmol/L (ref 101–111)
Creatinine, Ser: 1.23 mg/dL (ref 0.61–1.24)
GFR calc Af Amer: >60 mL/min (ref >60)
GFR calc non Af Amer: >60 mL/min (ref >60)
Glucose, Bld: 97 mg/dL (ref 65–99)
Potassium: 4.1 mmol/L (ref 3.5–5.1)
Sodium: 138 mmol/L (ref 135–145)
Total Bilirubin: 0.7 mg/dL (ref 0.3–1.2)
Total Protein: 7.3 g/dL (ref 6.5–8.1)

## 2018-02-23 LAB — SALICYLATE LEVEL: Salicylate Lvl: <7 mg/dL (ref 2.8–30.0)

## 2018-02-23 LAB — CBC
HCT: 42.5 % (ref 39.0–52.0)
Hemoglobin: 15 g/dL (ref 13.0–17.0)
MCH: 31.4 pg (ref 26.0–34.0)
MCHC: 35.3 g/dL (ref 30.0–36.0)
MCV: 88.9 fL (ref 78.0–100.0)
Platelets: 194 10*3/uL (ref 150–400)
RBC: 4.78 MIL/uL (ref 4.22–5.81)
RDW: 13 % (ref 11.5–15.5)
WBC: 5.8 10*3/uL (ref 4.0–10.5)

## 2018-02-23 LAB — ETHANOL: Alcohol, Ethyl (B): <10 mg/dL (ref <10)

## 2018-02-23 LAB — ACETAMINOPHEN LEVEL: Acetaminophen (Tylenol), Serum: <10 ug/mL — ABNORMAL LOW (ref 10–30)

## 2018-02-23 MED ORDER — MONTELUKAST SODIUM 10 MG PO TABS
10.0000 mg | ORAL_TABLET | Freq: Every day | ORAL | Status: DC
  Administered 2018-02-23: 10 mg via ORAL
  Filled 2018-02-23: qty 1

## 2018-02-23 MED ORDER — ALBUTEROL SULFATE HFA 108 (90 BASE) MCG/ACT IN AERS
2.0000 | INHALATION_SPRAY | RESPIRATORY_TRACT | Status: DC | PRN
Start: 2018-02-23 — End: 2018-02-24

## 2018-02-23 MED ORDER — HYDROXYZINE HCL 25 MG PO TABS: 50.0000 mg | ORAL_TABLET | Freq: Three times a day (TID) | ORAL | Status: DC | PRN

## 2018-02-23 MED ORDER — ACETAMINOPHEN 325 MG PO TABS: 650.0000 mg | ORAL_TABLET | ORAL | Status: DC | PRN

## 2018-02-23 MED ORDER — LORATADINE 10 MG PO TABS
10.0000 mg | ORAL_TABLET | Freq: Every day | ORAL | Status: DC
  Administered 2018-02-23 – 2018-02-24 (×2): 10 mg via ORAL
  Filled 2018-02-23 (×2): qty 1

## 2018-02-23 NOTE — ED Notes (Signed)
Bed: WA26 Expected date:  Expected time:  Means of arrival:  Comments: EMS hallucinations 

## 2018-02-23 NOTE — ED Provider Notes (Signed)
[facility]-EMERGENCY DEPT Provider Note   CSN: [id number] Arrival date & time: 02/23/18  1359     History   Chief Complaint Chief Complaint  Patient presents with  . Hallucinations    HPI [name] is a 27 y.o. male.  [name] is a 27 y.o. Male with a history of depression and schizophrenia, presents to the ED via EMS for evaluation of auditory and visual hallucinations.  Per EMS he was found sitting outside and reported to EMS that he was having hallucinations.  Patient denies any SI or AVH, but during my encounter repeatedly reports "I am very scared, I am having a panic attack, I need help".  Patient repeatedly endorses that he is having auditory and visual hallucinations, but patient is unable to describe them, when asked about the hallucinations patient reports I do not know I am just very scared.  Patient reports he is not currently taking any of his regular medications, based on chart review patient is on invega, Atarax, and Cogentin and trazodone.  Patient reports he came here because he needs to get help and he needs to go to behavioral health.  Patient denies alcohol use or substance abuse.  Patient reports he feels short of breath, he presented to the emergency department a few days ago for evaluation of the same, patient appears to be breathing comfortably, he denies fevers or chills, he denies cough, no chest pain, patient denies any headaches, vision changes, abdominal pain or nausea vomiting.     Past Medical History:  Diagnosis Date  . Depression   . Schizophrenia [facility])     Patient Active Problem List   Diagnosis Date Noted  . Schizophrenia, paranoid type (HCC) 02/14/2018  . Psychosis (HCC)   . Schizophrenia (HCC) 05/12/2016  . Cannabis use disorder, moderate, dependence (HCC) 05/10/2016    Past Surgical History:  Procedure Laterality Date  . BACK SURGERY          Home Medications    Prior to Admission medications     Medication Sig Start Date End Date Taking? Authorizing Provider  albuterol (PROVENTIL HFA;VENTOLIN HFA) 108 (90 Base) MCG/ACT inhaler Inhale 2 puffs into the lungs every 4 (four) hours as needed for wheezing or shortness of breath (cough). 02/19/18   Armandina Stammer I, NP  benztropine (COGENTIN) 1 MG tablet Take 1 tablet (1 mg total) by mouth 2 (two) times daily as needed (EPS). 02/19/18   Nwoko, Nicole Kindred I, NP  hydrOXYzine (ATARAX/VISTARIL) 50 MG tablet Take 1 tablet (50 mg total) by mouth 3 (three) times daily as needed (mild anxiety). 02/19/18   Armandina Stammer I, NP  loratadine (CLARITIN) 10 MG tablet Take 1 tablet (10 mg total) by mouth daily. For allergies 02/20/18   Armandina Stammer I, NP  montelukast (SINGULAIR) 10 MG tablet Take 1 tablet (10 mg total) by mouth at bedtime. For Asthma 02/19/18   Armandina Stammer I, NP  paliperidone (INVEGA SUSTENNA) 156 MG/ML SUSY injection Inject 1 mL (156 mg total) into the muscle every 30 (thirty) days. (Due on 03-17-18): For mood stabilization 03/17/18   Armandina Stammer I, NP  paliperidone (INVEGA) 6 MG 24 hr tablet Take 1 tablet (6 mg total) by mouth at bedtime. For mood control 02/19/18   Armandina Stammer I, NP  traZODone (DESYREL) 100 MG tablet Take 1 tablet (100 mg total) by mouth at bedtime as needed for sleep. 02/19/18   Sanjuana Kava, NP    Family History No family history on  file.  Social History Social History   Tobacco Use  . Smoking status: Current Some Day Smoker  . Smokeless tobacco: Never Used  Substance Use Topics  . Alcohol use: No  . Drug use: Yes    Types: Marijuana    Comment: rare     Allergies   Shellfish allergy   Review of Systems Review of Systems  Constitutional: Negative for chills and fever.  HENT: Negative for congestion, rhinorrhea and sore throat.   Eyes: Negative for photophobia and visual disturbance.  Respiratory: Positive for shortness of breath. Negative for cough.   Cardiovascular: Negative for chest pain and leg swelling.   Gastrointestinal: Negative for abdominal pain, nausea and vomiting.  Genitourinary: Negative for dysuria.  Musculoskeletal: Negative for arthralgias and myalgias.  Skin: Negative for color change, rash and wound.  Neurological: Negative for dizziness, weakness, light-headedness, numbness and headaches.  Psychiatric/Behavioral: Positive for dysphoric mood and hallucinations. Negative for agitation and suicidal ideas.     Physical Exam Updated Vital Signs BP 127/82 (BP Location: Left Arm)   Pulse 87   Temp 98.8 F (37.1 C) (Oral)   Resp 20   SpO2 97%   Physical Exam  Constitutional: He appears well-developed and well-nourished. No distress.  HENT:  Head: Normocephalic and atraumatic.  Mouth/Throat: Oropharynx is clear and moist.  Eyes: Pupils are equal, round, and reactive to light. EOM are normal. Right eye exhibits no discharge. Left eye exhibits no discharge.  Cardiovascular: Normal rate, regular rhythm, normal heart sounds and intact distal pulses.  Pulmonary/Chest: Effort normal and breath sounds normal. No stridor. No respiratory distress. He has no wheezes. He has no rales.  Respirations equal and unlabored, patient able to speak in full sentences, lungs clear to auscultation bilaterally  Abdominal: Soft. Bowel sounds are normal. He exhibits no distension and no mass. There is no tenderness. There is no guarding.  Abdomen soft, nondistended, nontender to palpation in all quadrants without guarding or peritoneal signs  Musculoskeletal: He exhibits no edema.  Neurological: He is alert. Coordination normal.  Skin: Skin is warm and dry. He is not diaphoretic.  Psychiatric: His mood appears anxious. His affect is labile. His speech is delayed. He is withdrawn. He is not actively hallucinating. He expresses impulsivity. He expresses no homicidal and no suicidal ideation. He expresses no suicidal plans and no homicidal plans. He is noncommunicative.  Nursing note and vitals  reviewed.    ED Treatments / Results  Labs (all labs ordered are listed, but only abnormal results are displayed) Labs Reviewed  COMPREHENSIVE METABOLIC PANEL - Abnormal; Notable for the following components:      Result Value   BUN 21 (*)    ALT 12 (*)    All other components within normal limits  ACETAMINOPHEN LEVEL - Abnormal; Notable for the following components:   Acetaminophen (Tylenol), Serum <10 (*)    All other components within normal limits  RAPID URINE DRUG SCREEN, HOSP PERFORMED - Abnormal; Notable for the following components:   Tetrahydrocannabinol POSITIVE (*)    All other components within normal limits  ETHANOL  SALICYLATE LEVEL  CBC    EKG None  Radiology Dg Chest 2 View  Result Date: 02/23/2018 CLINICAL DATA:  Acute shortness of breath. EXAM: CHEST - 2 VIEW COMPARISON:  01/28/2018 and prior radiographs FINDINGS: The cardiomediastinal silhouette is unremarkable. Mild chronic peribronchial thickening noted. There is no evidence of focal airspace disease, pulmonary edema, suspicious pulmonary nodule/mass, pleural effusion, or pneumothorax. No acute bony abnormalities  are identified. IMPRESSION: No active cardiopulmonary disease. Electronically Signed   By: Harmon Pier M.D.   On: 02/23/2018 15:20    Procedures Procedures (including critical care time)  Medications Ordered in ED Medications - No data to display   Initial Impression / Assessment and Plan / ED Course  I have reviewed the triage vital signs and the nursing notes.  Pertinent labs & imaging results that were available during my care of the patient were reviewed by me and considered in my medical decision making (see chart for details).  Patient with known history of schizophrenia presents to the ED for evaluation of auditory and visual hallucinations.  Patient denies any SI or HI, but repeatedly reports he is very scared and having a panic attack and needs help.  He is exhibiting some negative  symptoms, but does not appear to be actively hallucinating.  Patient reports he feels short of breath, of note patient is breathing easy on exam, and appears completely comfortable with normal O2 saturations, no tachypnea, vitals otherwise normal.  Patient denies any fevers or cough.  Chest x-ray is clear and EKG is unremarkable.  Patient reported to for similar symptoms a few days ago with normal work-up then as well.  I feel that patient's complaint of shortness of breath is more likely related to his anxiety.  He is PERC negative, I doubt ACS.  Patient is medically cleared for psychiatric evaluation at this time.  TTS consult placed and patient placed under ED psychiatric hold.  Home medications started, although patient reports he has not been on any of his psychiatric medications at this time so we will hold off on restarting invega or Cogentin.  Awaiting TTS recommendations for appropriate disposition.  TTS recommends overnight observation, will reevaluate in the morning.  Final Clinical Impressions(s) / ED Diagnoses   Final diagnoses:  Hallucinations  Schizophrenia, unspecified type (HCC)  SOB (shortness of breath)    ED Discharge Orders    None       Legrand Rams 02/23/18 2359    Loren Racer, MD 02/24/18 [phone]

## 2018-02-23 NOTE — ED Notes (Signed)
Third attempt to locate pt. Unsuccessful.

## 2018-02-23 NOTE — ED Notes (Signed)
Patient transported to X-ray 

## 2018-02-23 NOTE — ED Notes (Signed)
Bed: [facility] Expected date:  Expected time:  Means of arrival:  Comments: 

## 2018-02-23 NOTE — ED Triage Notes (Signed)
Per EMS personnel pt reports [facility]. Pt then states that he needed to go to behavioral health. Pt reports being off meds.

## 2018-02-23 NOTE — ED Notes (Signed)
Bed: [facility] Expected date:  Expected time:  Means of arrival:  Comments: 

## 2018-02-23 NOTE — ED Notes (Signed)
ED Provider at bedside. 

## 2018-02-23 NOTE — ED Notes (Signed)
Bed: [facility] Expected date:  Expected time:  Means of arrival:  Comments: [place]
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
## 2018-02-23 NOTE — ED Notes (Signed)
Bed: [facility] Expected date:  Expected time:  Means of arrival:  Comments: Urine on bed and floor. Waiting for House Cleaning

## 2018-02-23 NOTE — ED Triage Notes (Signed)
Pt came in by [facility] from home where he was sitting outside. Pt reported to EMS that he is having auditory and visual hallucinations. Denies SI or HI. Reports he is not currently taking any medications.

## 2018-02-23 NOTE — ED Notes (Signed)
[facility]: [facility] Expected date:  Expected time:  Means of arrival:  Comments: 

## 2018-02-23 NOTE — ED Notes (Signed)
Second attempt to locate pt. Unsuccessful.

## 2018-02-23 NOTE — ED Notes (Signed)
Looked for pt, he was not in room. Did not tell anyone if he was leaving. Noticed security

## 2018-02-23 NOTE — ED Notes (Addendum)
Pt A&O x 3, complaint of auditory and visual hallucinations.  Denies SI or HI.  Calm, cooperative, not interactive at present. Pt not forthcoming with information.  No distress noted,  Monitoring for safety, Q 15 min checks in effect.  Pt resting at present.

## 2018-02-23 NOTE — ED Notes (Signed)
Pt's guardian is Joslyn Hy and her telephone number is [phone].  She called to check on him.

## 2018-02-24 DIAGNOSIS — F1210 Cannabis abuse, uncomplicated: Secondary | ICD-10-CM | POA: Diagnosis not present

## 2018-02-24 DIAGNOSIS — F200 Paranoid schizophrenia: Secondary | ICD-10-CM

## 2018-02-24 DIAGNOSIS — G4700 Insomnia, unspecified: Secondary | ICD-10-CM | POA: Diagnosis not present

## 2018-02-24 DIAGNOSIS — G259 Extrapyramidal and movement disorder, unspecified: Secondary | ICD-10-CM | POA: Diagnosis not present

## 2018-02-24 DIAGNOSIS — F17200 Nicotine dependence, unspecified, uncomplicated: Secondary | ICD-10-CM | POA: Diagnosis not present

## 2018-02-24 MED ORDER — BENZTROPINE MESYLATE 1 MG PO TABS
1.0000 mg | ORAL_TABLET | Freq: Two times a day (BID) | ORAL | Status: DC | PRN
Start: 2018-02-24 — End: 2018-02-24

## 2018-02-24 MED ORDER — PALIPERIDONE ER 6 MG PO TB24: 6.0000 mg | ORAL_TABLET | Freq: Every day | ORAL | Status: DC

## 2018-02-24 MED ORDER — TRAZODONE HCL 100 MG PO TABS: 100.0000 mg | ORAL_TABLET | Freq: Every evening | ORAL | Status: DC | PRN

## 2018-02-24 MED ORDER — PALIPERIDONE ER 6 MG PO TB24
6.0000 mg | ORAL_TABLET | Freq: Every day | ORAL | Status: DC
Start: 2018-02-24 — End: 2018-02-24
  Administered 2018-02-24: 6 mg via ORAL
  Filled 2018-02-24: qty 1

## 2018-02-24 NOTE — Consult Note (Addendum)
[facility] Consult   Reason for Consult:  Hallucinations  Referring Physician:  EDP Patient Identification: [name] MRN:  [id number] Principal Diagnosis: Schizophrenia, paranoid type [facility]) Diagnosis:   Patient Active Problem List   Diagnosis Date Noted  . Schizophrenia, paranoid type ([place]) [F20.0] 02/14/2018    Priority: High  . Cannabis use disorder, moderate, dependence ([place]) [F12.20] 05/10/2016    Priority: High  . Psychosis ([place]) [F29]     Total Time spent with patient: 45 minutes  Subjective:   [name] is a 27 y.o. male patient does not warrant admission.  HPI:  27 yo male who came to the ED complaining of hallucinations.  Evidently, he had just received his long term injectable and came here.  His aunt is trying to care for him but he often does his own thing and does not tell her.  RN will notify his aunt as he is at his baseline.  No suicidal/homicidal ideations or substance abuse other than cannabis.  Some hallucinations but not command type, this is his baseline.    Past Psychiatric History: schizophrenia  Risk to Self: Suicidal Ideation: No Suicidal Intent: No Is patient at risk for suicide?: No Suicidal Plan?: No Access to Means: No What has been your use of drugs/alcohol within the last 12 months?: THC(pt UDS's positive for THC ) How many times?: 0 Other Self Harm Risks: pt denies  Triggers for Past Attempts: Unknown Intentional Self Injurious Behavior: None Risk to Others: Homicidal Ideation: No Thoughts of Harm to Others: No Current Homicidal Intent: No Current Homicidal Plan: No Access to Homicidal Means: No Identified Victim: n/a History of harm to others?: No Assessment of Violence: None Noted Violent Behavior Description: none noted Does patient have access to weapons?: No Criminal Charges Pending?: No Does patient have a court date: No Prior Inpatient Therapy: Prior Inpatient Therapy: Yes Prior Therapy Dates:  01/16/18,01/23/18 Prior Therapy Facilty/Provider(s): [facility] Reason for Treatment: MH issues Prior Outpatient Therapy: Prior Outpatient Therapy: No Does patient have an ACCT team?: No Does patient have Intensive In-House Services?  : No Does patient have [facility] services? : No Does patient have P4CC services?: No  Past Medical History:  Past Medical History:  Diagnosis Date  . Depression   . Schizophrenia [facility])     Past Surgical History:  Procedure Laterality Date  . BACK SURGERY     Family History: No family history on file. Family Psychiatric  History: none Social History:  Social History   Substance and Sexual Activity  Alcohol Use No     Social History   Substance and Sexual Activity  Drug Use Yes  . Types: Marijuana   Comment: rare    Social History   Socioeconomic History  . Marital status: Single    Spouse name: Not on file  . Number of children: Not on file  . Years of education: Not on file  . Highest education level: Not on file  Occupational History  . Not on file  Social Needs  . Financial resource strain: Not on file  . Food insecurity:    Worry: Not on file    Inability: Not on file  . Transportation needs:    Medical: Not on file    Non-medical: Not on file  Tobacco Use  . Smoking status: Current Some Day Smoker  . Smokeless tobacco: Never Used  Substance and Sexual Activity  . Alcohol use: No  . Drug use: Yes    Types: Marijuana  Comment: rare  . Sexual activity: Yes    Birth control/protection: Condom  Lifestyle  . Physical activity:    Days per week: Not on file    Minutes per session: Not on file  . Stress: Not on file  Relationships  . Social connections:    Talks on phone: Not on file    Gets together: Not on file    Attends religious service: Not on file    Active member of club or organization: Not on file    Attends meetings of clubs or organizations: Not on file    Relationship status: Not on file  Other Topics Concern   . Not on file  Social History Narrative  . Not on file   Additional Social History:    Allergies:   Allergies  Allergen Reactions  . Shellfish Allergy Hives and Itching    Labs:  Results for orders placed or performed during the hospital encounter of 02/23/18 (from the past 48 hour(s))  Rapid urine drug screen (hospital performed)     Status: Abnormal   Collection Time: 02/23/18  2:16 PM  Result Value Ref Range   Opiates NONE DETECTED NONE DETECTED   Cocaine NONE DETECTED NONE DETECTED   Benzodiazepines NONE DETECTED NONE DETECTED   Amphetamines NONE DETECTED NONE DETECTED   Tetrahydrocannabinol POSITIVE (A) NONE DETECTED   Barbiturates NONE DETECTED NONE DETECTED    Comment: (NOTE) DRUG SCREEN FOR MEDICAL PURPOSES ONLY.  IF CONFIRMATION IS NEEDED FOR ANY PURPOSE, NOTIFY LAB WITHIN 5 DAYS. LOWEST DETECTABLE LIMITS FOR URINE DRUG SCREEN Drug Class                     Cutoff (ng/mL) Amphetamine and metabolites    1000 Barbiturate and metabolites    200 Benzodiazepine                 604 Tricyclics and metabolites     300 Opiates and metabolites        300 Cocaine and metabolites        300 THC                            50 Performed at [facility], [place] [address], [place], [place] [address]   Comprehensive metabolic panel     Status: Abnormal   Collection Time: 02/23/18  2:32 PM  Result Value Ref Range   Sodium 138 135 - 145 mmol/L   Potassium 4.1 3.5 - 5.1 mmol/L   Chloride 101 101 - 111 mmol/L   CO2 26 22 - 32 mmol/L   Glucose, Bld 97 65 - 99 mg/dL   BUN 21 (H) 6 - 20 mg/dL   Creatinine, Ser 1.23 0.61 - 1.24 mg/dL   Calcium 9.4 8.9 - 10.3 mg/dL   Total Protein 7.3 6.5 - 8.1 g/dL   Albumin 4.3 3.5 - 5.0 g/dL   AST 16 15 - 41 U/L   ALT 12 (L) 17 - 63 U/L   Alkaline Phosphatase 42 38 - 126 U/L   Total Bilirubin 0.7 0.3 - 1.2 mg/dL   GFR calc non Af Amer >60 >60 mL/min   GFR calc Af Amer >60 >60 mL/min    Comment: (NOTE) The eGFR has  been calculated using the CKD EPI equation. This calculation has not been validated in all clinical situations. eGFR's persistently <60 mL/min signify possible Chronic Kidney Disease.    Anion gap 11  5 - 15    Comment: Performed at [facility], [place] [address] [place], [place] [address]  Ethanol     Status: None   Collection Time: 02/23/18  2:32 PM  Result Value Ref Range   Alcohol, Ethyl (B) <10 <10 mg/dL    Comment: (NOTE) Lowest detectable limit for serum alcohol is 10 mg/dL. For medical purposes only. Performed at [facility], [place] [address], [place], [place] [address]   Salicylate level     Status: None   Collection Time: 02/23/18  2:32 PM  Result Value Ref Range   Salicylate Lvl <3.7 2.8 - 30.0 mg/dL    Comment: Performed at [facility], [place] [address] [place], [place] [address]  Acetaminophen level     Status: Abnormal   Collection Time: 02/23/18  2:32 PM  Result Value Ref Range   Acetaminophen (Tylenol), Serum <10 (L) 10 - 30 ug/mL    Comment: (NOTE) Therapeutic concentrations vary significantly. A range of 10-30 ug/mL  may be an effective concentration for many patients. However, some  are best treated at concentrations outside of this range. Acetaminophen concentrations >150 ug/mL at 4 hours after ingestion  and >50 ug/mL at 12 hours after ingestion are often associated with  toxic reactions. Performed at [facility], [place] [address], [place], [place] [address]   cbc     Status: None   Collection Time: 02/23/18  2:32 PM  Result Value Ref Range   WBC 5.8 4.0 - 10.5 K/uL   RBC 4.78 4.22 - 5.81 MIL/uL   Hemoglobin 15.0 13.0 - 17.0 g/dL   HCT 42.5 39.0 - 52.0 %   MCV 88.9 78.0 - 100.0 fL   MCH 31.4 26.0 - 34.0 pg   MCHC 35.3 30.0 - 36.0 g/dL   RDW 13.0 11.5 - 15.5 %   Platelets 194 150 - 400 K/uL    Comment: Performed at [facility], [place] [address]  [place], [place] [address]    Current Facility-Administered Medications  Medication Dose Route Frequency Provider Last Rate Last Dose  . acetaminophen (TYLENOL) tablet 650 mg  650 mg Oral Q4H PRN Jacqlyn Larsen, PA-C      . albuterol (PROVENTIL HFA;VENTOLIN HFA) 108 (90 Base) MCG/ACT inhaler 2 puff  2 puff Inhalation Q4H PRN Benedetto Goad N, PA-C      . benztropine (COGENTIN) tablet 1 mg  1 mg Oral BID PRN Patrecia Pour, NP      . hydrOXYzine (ATARAX/VISTARIL) tablet 50 mg  50 mg Oral TID PRN Jacqlyn Larsen, PA-C      . loratadine (CLARITIN) tablet 10 mg  10 mg Oral Daily Benedetto Goad N, PA-C   10 mg at 02/23/18 1551  . montelukast (SINGULAIR) tablet 10 mg  10 mg Oral QHS Benedetto Goad N, PA-C   10 mg at 02/23/18 1551  . paliperidone (INVEGA) 24 hr tablet 6 mg  6 mg Oral Daily Lord, Jamison Y, NP      . traZODone (DESYREL) tablet 100 mg  100 mg Oral QHS PRN Patrecia Pour, NP       Current Outpatient Medications  Medication Sig Dispense Refill  . albuterol (PROVENTIL HFA;VENTOLIN HFA) 108 (90 Base) MCG/ACT inhaler Inhale 2 puffs into the lungs every 4 (four) hours as needed for wheezing or shortness of breath (cough).    . benztropine (COGENTIN) 1 MG tablet Take 1 tablet (1 mg total) by mouth 2 (  two) times daily as needed (EPS). 60 tablet 0  . hydrOXYzine (ATARAX/VISTARIL) 50 MG tablet Take 1 tablet (50 mg total) by mouth 3 (three) times daily as needed (mild anxiety). 75 tablet 0  . loratadine (CLARITIN) 10 MG tablet Take 1 tablet (10 mg total) by mouth daily. For allergies    . montelukast (SINGULAIR) 10 MG tablet Take 1 tablet (10 mg total) by mouth at bedtime. For Asthma    . [START ON 03/17/2018] paliperidone (INVEGA SUSTENNA) 156 MG/ML SUSY injection Inject 1 mL (156 mg total) into the muscle every 30 (thirty) days. (Due on 03-17-18): For mood stabilization 1.2 mL 0  . paliperidone (INVEGA) 6 MG 24 hr tablet Take 1 tablet (6 mg total) by mouth at bedtime. For mood control 30 tablet 0  .  traZODone (DESYREL) 100 MG tablet Take 1 tablet (100 mg total) by mouth at bedtime as needed for sleep. 30 tablet 0    Musculoskeletal: Strength & Muscle Tone: within normal limits Gait & Station: normal Patient leans: N/A  Psychiatric Specialty Exam: Physical Exam  Constitutional: He is oriented to person, place, and time. He appears well-developed and well-nourished.  HENT:  Head: Normocephalic.  Neck: Normal range of motion.  Respiratory: Effort normal.  Musculoskeletal: Normal range of motion.  Neurological: He is alert and oriented to person, place, and time.  Psychiatric: He has a normal mood and affect. His speech is normal. Judgment and thought content normal. He is actively hallucinating. Cognition and memory are normal.    Review of Systems  Psychiatric/Behavioral: Positive for hallucinations and substance abuse.  All other systems reviewed and are negative.   Blood pressure 121/71, pulse (!) 55, temperature 98.1 F (36.7 C), temperature source Oral, resp. rate 18, SpO2 97 %.There is no height or weight on file to calculate BMI.  General Appearance: Casual  Eye Contact:  Good  Speech:  Normal Rate  Volume:  Normal  Mood:  Euthymic  Affect:  Blunt  Thought Process:  Coherent and Descriptions of Associations: Intact  Orientation:  Full (Time, Place, and Person)  Thought Content:  WDL and Logical  Suicidal Thoughts:  No  Homicidal Thoughts:  No  Memory:  Immediate;   Fair Recent;   Fair Remote;   Fair  Judgement:  Fair  Insight:  Fair  Psychomotor Activity:  Normal  Concentration:  Concentration: Good and Attention Span: Good  Recall:  Good  Fund of Knowledge:  Fair  Language:  Good  Akathisia:  No  Handed:  Right  AIMS (if indicated):     Assets:  Housing Leisure Time Physical Health Resilience Social Support  ADL's:  Intact  Cognition:  WNL  Sleep:        Treatment Plan Summary: Schizophrenia, paranoid type: -Continued Invega 6 mg daily for  psychosis  Insomnia -Started Trazodone 100 mg at bedtime for sleep PRN  EPS: -Restarted Cogentin 1 mg BID PRN   Disposition: No evidence of imminent risk to self or others at present.    Waylan Boga, NP 02/24/2018 9:43 AM   Patient seen face to face for this evaluation, case discussed with treatment team and physician extender and formulated treatment plan. Reviewed the information documented and agree with the treatment plan.  Ambrose Finland, MD 02/24/2018

## 2018-02-24 NOTE — ED Notes (Signed)
Aunt will pt up between 1330-1400.

## 2018-02-24 NOTE — ED Notes (Signed)
Patient denies pain and is resting comfortably.  

## 2018-02-24 NOTE — BHH Suicide Risk Assessment (Signed)
Suicide Risk Assessment  Discharge Assessment   [facility] Discharge Suicide Risk Assessment   Principal Problem: Schizophrenia, paranoid type [facility]) Discharge Diagnoses:  Patient Active Problem List   Diagnosis Date Noted  . Schizophrenia, paranoid type (HCC) [F20.0] 02/14/2018    Priority: High  . Cannabis use disorder, moderate, dependence (HCC) [F12.20] 05/10/2016    Priority: High  . Psychosis (HCC) [F29]     Total Time spent with patient: 45 minutes  Musculoskeletal: Strength & Muscle Tone: within normal limits Gait & Station: normal Patient leans: N/A  Psychiatric Specialty Exam: Physical Exam  Constitutional: He is oriented to person, place, and time. He appears well-developed and well-nourished.  HENT:  Head: Normocephalic.  Neck: Normal range of motion.  Respiratory: Effort normal.  Musculoskeletal: Normal range of motion.  Neurological: He is alert and oriented to person, place, and time.  Psychiatric: He has a normal mood and affect. His speech is normal. Judgment and thought content normal. He is actively hallucinating. Cognition and memory are normal.    Review of Systems  Psychiatric/Behavioral: Positive for hallucinations and substance abuse.  All other systems reviewed and are negative.   Blood pressure 121/71, pulse (!) 55, temperature 98.1 F (36.7 C), temperature source Oral, resp. rate 18, SpO2 97 %.There is no height or weight on file to calculate BMI.  General Appearance: Casual  Eye Contact:  Good  Speech:  Normal Rate  Volume:  Normal  Mood:  Euthymic  Affect:  Blunt  Thought Process:  Coherent and Descriptions of Associations: Intact  Orientation:  Full (Time, Place, and Person)  Thought Content:  WDL and Logical  Suicidal Thoughts:  No  Homicidal Thoughts:  No  Memory:  Immediate;   Fair Recent;   Fair Remote;   Fair  Judgement:  Fair  Insight:  Fair  Psychomotor Activity:  Normal  Concentration:  Concentration: Good and Attention Span: Good   Recall:  Good  Fund of Knowledge:  Fair  Language:  Good  Akathisia:  No  Handed:  Right  AIMS (if indicated):     Assets:  Housing Leisure Time Physical Health Resilience Social Support  ADL's:  Intact  Cognition:  WNL  Sleep:       Mental Status Per Nursing Assessment::   On Admission:   hallucinations  Demographic Factors:  Male and Adolescent or young adult  Loss Factors: NA  Historical Factors: NA  Risk Reduction Factors:   Sense of responsibility to family, Living with another person, especially a relative and Positive social support  Continued Clinical Symptoms:  Hallucinations, mild  Cognitive Features That Contribute To Risk:  None    Suicide Risk:  Minimal: No identifiable suicidal ideation.  Patients presenting with no risk factors but with morbid ruminations; may be classified as minimal risk based on the severity of the depressive symptoms    Plan Of Care/Follow-up recommendations:  Activity:  as tolerated Diet:  heart healthy diet  Charina Fons, NP 02/24/2018, 9:54 AM

## 2018-02-24 NOTE — ED Notes (Signed)
Unable able to reach aunt. Will try back before being discharge. Pt is aware will be discharge.

## 2018-02-25 ENCOUNTER — Encounter (HOSPITAL_COMMUNITY): Payer: Self-pay | Admitting: Emergency Medicine

## 2018-02-25 ENCOUNTER — Emergency Department (HOSPITAL_COMMUNITY)
Admission: EM | Admit: 2018-02-25 | Discharge: 2018-02-26 | Disposition: A | Discharge status: Home / Self Care | Payer: Medicaid Other | Source: Home / Self Care | Attending: Emergency Medicine | Admitting: Emergency Medicine

## 2018-02-25 ENCOUNTER — Emergency Department (HOSPITAL_COMMUNITY)
Admission: EM | Admit: 2018-02-25 | Discharge: 2018-02-25 | Discharge status: Against Medical Advice | Payer: Medicaid Other | Attending: Emergency Medicine | Admitting: Emergency Medicine

## 2018-02-25 ENCOUNTER — Encounter (HOSPITAL_COMMUNITY): Payer: Self-pay | Admitting: *Deleted

## 2018-02-25 DIAGNOSIS — F419 Anxiety disorder, unspecified: Secondary | ICD-10-CM | POA: Diagnosis present

## 2018-02-25 DIAGNOSIS — Z79899 Other long term (current) drug therapy: Secondary | ICD-10-CM | POA: Diagnosis not present

## 2018-02-25 DIAGNOSIS — F17200 Nicotine dependence, unspecified, uncomplicated: Secondary | ICD-10-CM | POA: Insufficient documentation

## 2018-02-25 DIAGNOSIS — F209 Schizophrenia, unspecified: Secondary | ICD-10-CM | POA: Diagnosis not present

## 2018-02-25 DIAGNOSIS — F1220 Cannabis dependence, uncomplicated: Secondary | ICD-10-CM | POA: Insufficient documentation

## 2018-02-25 LAB — COMPREHENSIVE METABOLIC PANEL
ALT: 12 U/L — ABNORMAL LOW (ref 17–63)
AST: 15 U/L (ref 15–41)
Albumin: 4 g/dL (ref 3.5–5.0)
Alkaline Phosphatase: 36 U/L — ABNORMAL LOW (ref 38–126)
Anion gap: 10 (ref 5–15)
BUN: 18 mg/dL (ref 6–20)
CO2: 26 mmol/L (ref 22–32)
Calcium: 9.5 mg/dL (ref 8.9–10.3)
Chloride: 102 mmol/L (ref 101–111)
Creatinine, Ser: 1.13 mg/dL (ref 0.61–1.24)
GFR calc Af Amer: >60 mL/min (ref >60)
GFR calc non Af Amer: >60 mL/min (ref >60)
Glucose, Bld: 136 mg/dL — ABNORMAL HIGH (ref 65–99)
Potassium: 3.7 mmol/L (ref 3.5–5.1)
Sodium: 138 mmol/L (ref 135–145)
Total Bilirubin: 0.9 mg/dL (ref 0.3–1.2)
Total Protein: 7 g/dL (ref 6.5–8.1)

## 2018-02-25 LAB — RAPID URINE DRUG SCREEN, HOSP PERFORMED
Amphetamines: NOT DETECTED
Barbiturates: NOT DETECTED
Benzodiazepines: NOT DETECTED
Cocaine: NOT DETECTED
Opiates: NOT DETECTED
Tetrahydrocannabinol: POSITIVE — AB

## 2018-02-25 LAB — ACETAMINOPHEN LEVEL: Acetaminophen (Tylenol), Serum: <10 ug/mL — ABNORMAL LOW (ref 10–30)

## 2018-02-25 LAB — CBC WITH DIFFERENTIAL/PLATELET
Basophils Absolute: 0 10*3/uL (ref 0.0–0.1)
Basophils Relative: 1 %
Eosinophils Absolute: 0.3 10*3/uL (ref 0.0–0.7)
Eosinophils Relative: 6 %
HCT: 43.2 % (ref 39.0–52.0)
Hemoglobin: 15.4 g/dL (ref 13.0–17.0)
Lymphocytes Relative: 53 %
Lymphs Abs: 2.9 10*3/uL (ref 0.7–4.0)
MCH: 31 pg (ref 26.0–34.0)
MCHC: 35.6 g/dL (ref 30.0–36.0)
MCV: 86.9 fL (ref 78.0–100.0)
Monocytes Absolute: 0.2 10*3/uL (ref 0.1–1.0)
Monocytes Relative: 4 %
Neutro Abs: 1.9 10*3/uL (ref 1.7–7.7)
Neutrophils Relative %: 36 %
Platelets: 196 10*3/uL (ref 150–400)
RBC: 4.97 MIL/uL (ref 4.22–5.81)
RDW: 12.8 % (ref 11.5–15.5)
WBC: 5.5 10*3/uL (ref 4.0–10.5)

## 2018-02-25 LAB — ETHANOL: Alcohol, Ethyl (B): <10 mg/dL (ref <10)

## 2018-02-25 LAB — SALICYLATE LEVEL: Salicylate Lvl: <7 mg/dL (ref 2.8–30.0)

## 2018-02-25 MED ORDER — MONTELUKAST SODIUM 10 MG PO TABS: 10.0000 mg | ORAL_TABLET | Freq: Every day | ORAL | Status: DC

## 2018-02-25 MED ORDER — HYDROXYZINE HCL 25 MG PO TABS: 50.0000 mg | ORAL_TABLET | Freq: Three times a day (TID) | ORAL | Status: DC | PRN

## 2018-02-25 MED ORDER — BENZTROPINE MESYLATE 1 MG PO TABS: 1.0000 mg | ORAL_TABLET | Freq: Two times a day (BID) | ORAL | Status: DC | PRN

## 2018-02-25 MED ORDER — TRAZODONE HCL 100 MG PO TABS: 100.0000 mg | ORAL_TABLET | Freq: Every evening | ORAL | Status: DC | PRN

## 2018-02-25 MED ORDER — LORATADINE 10 MG PO TABS: 10.0000 mg | ORAL_TABLET | Freq: Every day | ORAL | Status: DC

## 2018-02-25 MED ORDER — PALIPERIDONE ER 6 MG PO TB24: 6.0000 mg | ORAL_TABLET | Freq: Every day | ORAL | Status: DC

## 2018-02-25 NOTE — BH Assessment (Addendum)
Assessment Note  [name] is an 27 y.o. male.  -Clinician reviewed note from Dr. Linwood Warner.  Patient presents to the emergency room for evaluation of recurrent anxiety.  Patient feels like he is having a panic attack.  He says he is having trouble breathing and cannot catch his breath.  Patient has a history of schizophrenia and was seen in the emergency room yesterday for the same symptoms.  He was evaluated by behavioral health and discharged.  Patient was brought in by EMS.  Initially he wanted to go to behavioral health [facility] but was instructed to come to the emergency room here  Dr. Lynelle Warner saw patient in the AM today.  Previous clinician [name]) attempted to assess patient but pt was sleeping and would not participate in assessment.  Around 12:47 patient left [facility] after he told the nurse he wanted to leave.    Pt called EMS later and said he could not breathe.  He was picked up on a side walk.  Pt wanted to go to [facility].  Patient was resting when this clinician talked to him.  Patient said he still felt like he could not breathe.  He denies any SI, no plan or intention.  No HI or plan to harm others.  Patient says he hears voices but cannot tell what they are saying.    Patient said he wanted to go back to [facility].  Patient was asked if he followed up with discharge instructions when he was d/c'ed from [facility] on 02/20/18.  Patient was asked if he followed up with [facility] and he says he did on Friday but cannot remember what happened when he went to [place].  Patient confirmed that he lives with his aunt.  Patient said he wanted to go to sleep then turned his back on clinician.  -Clinician discussed patient care with Adrian Sievert, PA.  [name] said that patient did not meet inpatient care criteria.  Clinician let PA Cortni know about disposition.  Diagnosis: F20.9 Schizophrenia  Past Medical History:  Past Medical History:  Diagnosis Date  . Depression   . Schizophrenia [name])     Past  Surgical History:  Procedure Laterality Date  . BACK SURGERY      Family History: No family history on file.  Social History:  reports that he has been smoking.  He has never used smokeless tobacco. He reports that he has current or past drug history. Drug: Marijuana. He reports that he does not drink alcohol.  Additional Social History:  Alcohol / Drug Use Pain Medications: See [name] d/c medication list from 02/20/18 Prescriptions: [name] d/c list from 02/20/18 Over the Counter: Share [name] d/c list from 02/20/18 Negative Consequences of Use: Personal relationships Substance #1 Name of Substance 1: Marijuana (per record review) 1 - Age of First Use: Pt would not talk about it. 1 - Amount (size/oz): Unknown 1 - Frequency: Unknown 1 - Duration: Unknown 1 - Last Use / Amount: Unknown  CIWA:   COWS:    Allergies:  Allergies  Allergen Reactions  . Shellfish Allergy Hives and Itching    Home Medications:  (Not in a Warner admission)  OB/GYN Status:  No LMP for male patient.  General Assessment Data [facility] of Assessment: [facility] TTS Assessment: In system Is this a Tele or Face-to-Face Assessment?: Face-to-Face Is this an Initial Assessment or a Re-assessment for this encounter?: Initial Assessment Marital status: Single Is patient pregnant?: No Pregnancy Status: No Living Arrangements: Other relatives(Staying with his aunt.)  Can pt return to current living arrangement?: Yes Admission Status: Voluntary Is patient capable of signing voluntary admission?: Yes Referral Source: Self/Family/Friend(Pt called EMS because he "could not breathe.") Insurance type: MCD     Crisis Care Plan Living Arrangements: Other relatives(Staying with his aunt.) Name of Psychiatrist: None Name of Therapist: None  Education Status Is patient currently in school?: No Is the patient employed, unemployed or receiving disability?: Receiving disability income  Risk to self with the past 6  months Suicidal Ideation: No Has patient been a risk to self within the past 6 months prior to admission? : No Suicidal Intent: No Has patient had any suicidal intent within the past 6 months prior to admission? : No Is patient at risk for suicide?: No Suicidal Plan?: No Has patient had any suicidal plan within the past 6 months prior to admission? : No Access to Means: No What has been your use of drugs/alcohol within the last 12 months?: Pt denies. Previous Attempts/Gestures: No How many times?: 0 Other Self Harm Risks: None Triggers for Past Attempts: None known Intentional Self Injurious Behavior: None Family Suicide History: No Recent stressful life event(s): Other (Comment)(Pt just identifies not being able to breathe well.) Persecutory voices/beliefs?: No Depression: Yes Depression Symptoms: Despondent, Loss of interest in usual pleasures, Insomnia, Isolating Substance abuse history and/or treatment for substance abuse?: Yes Suicide prevention information given to non-admitted patients: Not applicable  Risk to Others within the past 6 months Homicidal Ideation: No Does patient have any lifetime risk of violence toward others beyond the six months prior to admission? : No Thoughts of Harm to Others: No Current Homicidal Intent: No Current Homicidal Plan: No Access to Homicidal Means: No Identified Victim: No one History of harm to others?: No Assessment of Violence: None Noted Violent Behavior Description: None reported Does patient have access to weapons?: No Criminal Charges Pending?: No Does patient have a court date: No Is patient on probation?: No  Psychosis Hallucinations: Auditory(Cannot tell what they are saying) Delusions: None noted  Mental Status Report Appearance/Hygiene: Unremarkable Eye Contact: Fair Motor Activity: Freedom of movement, Unremarkable Speech: Logical/coherent, Soft, Slow Level of Consciousness: Quiet/awake Mood: Helpless,  Sad Affect: Sad, Blunted Anxiety Level: Minimal Thought Processes: Coherent, Relevant Judgement: Unable to Assess Orientation: Appropriate for developmental age Obsessive Compulsive Thoughts/Behaviors: None  Cognitive Functioning Concentration: Decreased Memory: Recent Impaired, Remote Intact Is patient IDD: No Is patient DD?: Yes Insight: Poor Impulse Control: Fair Appetite: Fair Have you had any weight changes? : No Change Sleep: Decreased Total Hours of Sleep: (UTA) Vegetative Symptoms: None  ADLScreening [facility] Assessment Services) Patient's cognitive ability adequate to safely complete daily activities?: Yes Patient able to express need for assistance with ADLs?: Yes Independently performs ADLs?: Yes (appropriate for developmental age)  Prior Inpatient Therapy Prior Inpatient Therapy: Yes Prior Therapy Dates: 05/10-05/15/19; 01/16/18,01/23/18 Prior Therapy Facilty/Provider(s): [facility] Reason for Treatment: Psychosis  Prior Outpatient Therapy Prior Outpatient Therapy: No(Pt may have gone to [facility] on 02/22/18.) Does patient have an ACCT team?: No Does patient have Intensive In-House Services?  : No Does patient have [facility] services? : No Does patient have P4CC services?: No  ADL Screening (condition at time of admission) Patient's cognitive ability adequate to safely complete daily activities?: Yes Is the patient deaf or have difficulty hearing?: No Does the patient have difficulty seeing, even when wearing glasses/contacts?: No Does the patient have difficulty concentrating, remembering, or making decisions?: Yes Patient able to express need for assistance with ADLs?: Yes Does the  patient have difficulty dressing or bathing?: No Independently performs ADLs?: Yes (appropriate for developmental age) Does the patient have difficulty walking or climbing stairs?: No Weakness of Legs: None Weakness of Arms/Hands: None       Abuse/Neglect Assessment (Assessment to be  complete while patient is alone) Physical Abuse: Denies Verbal Abuse: Denies Sexual Abuse: Denies Exploitation of patient/patient's resources: Denies Self-Neglect: Denies     Merchant navy officer (For Healthcare) Does Patient Have a Medical Advance Directive?: No Would patient like information on creating a medical advance directive?: No - Patient declined          Disposition:  Disposition Initial Assessment Completed for this Encounter: Yes Patient referred to: Other (Comment)(To be reviewed by PA)  On Site Evaluation by:   Reviewed with Physician:    Alexandria Lodge 02/25/2018 10:39 PM

## 2018-02-25 NOTE — ED Triage Notes (Signed)
Per EMS-states patient having anxiety since this am-wanted to go to [facility] but they sent him here-states he cant sit down-was here 2 days ago for the same symptoms

## 2018-02-25 NOTE — ED Provider Notes (Signed)
[facility]-EMERGENCY DEPT Provider Note   CSN: [id number] Arrival date & time: 02/25/18  1032     History   Chief Complaint Chief Complaint  Patient presents with  . Anxiety    HPI [name] is a 27 y.o. male.  HPI Patient presents to the emergency room for evaluation of recurrent anxiety.  Patient feels like he is having a panic attack.  He says he is having trouble breathing and cannot catch his breath.  Patient has a history of schizophrenia and was seen in the emergency room yesterday for the same symptoms.  He was evaluated by behavioral health and discharged.  Patient was brought in by EMS.  Initially he wanted to go to behavioral health hospital but was instructed to come to the emergency room here. Past Medical History:  Diagnosis Date  . Depression   . Schizophrenia [facility])     Patient Active Problem List   Diagnosis Date Noted  . Schizophrenia, paranoid type (HCC) 02/14/2018  . Psychosis (HCC)   . Cannabis use disorder, moderate, dependence (HCC) 05/10/2016    Past Surgical History:  Procedure Laterality Date  . BACK SURGERY          Home Medications    Prior to Admission medications   Medication Sig Start Date End Date Taking? Authorizing Provider  albuterol (PROVENTIL HFA;VENTOLIN HFA) 108 (90 Base) MCG/ACT inhaler Inhale 2 puffs into the lungs every 4 (four) hours as needed for wheezing or shortness of breath (cough). 02/19/18   Armandina Stammer I, NP  benztropine (COGENTIN) 1 MG tablet Take 1 tablet (1 mg total) by mouth 2 (two) times daily as needed (EPS). 02/19/18   Nwoko, Nicole Kindred I, NP  hydrOXYzine (ATARAX/VISTARIL) 50 MG tablet Take 1 tablet (50 mg total) by mouth 3 (three) times daily as needed (mild anxiety). 02/19/18   Armandina Stammer I, NP  loratadine (CLARITIN) 10 MG tablet Take 1 tablet (10 mg total) by mouth daily. For allergies 02/20/18   Armandina Stammer I, NP  montelukast (SINGULAIR) 10 MG tablet Take 1 tablet (10 mg total) by mouth  at bedtime. For Asthma 02/19/18   Armandina Stammer I, NP  paliperidone (INVEGA SUSTENNA) 156 MG/ML SUSY injection Inject 1 mL (156 mg total) into the muscle every 30 (thirty) days. (Due on 03-17-18): For mood stabilization 03/17/18   Armandina Stammer I, NP  paliperidone (INVEGA) 6 MG 24 hr tablet Take 1 tablet (6 mg total) by mouth at bedtime. For mood control 02/19/18   Armandina Stammer I, NP  traZODone (DESYREL) 100 MG tablet Take 1 tablet (100 mg total) by mouth at bedtime as needed for sleep. 02/19/18   Sanjuana Kava, NP    Family History No family history on file.  Social History Social History   Tobacco Use  . Smoking status: Current Some Day Smoker  . Smokeless tobacco: Never Used  Substance Use Topics  . Alcohol use: No  . Drug use: Yes    Types: Marijuana    Comment: rare     Allergies   Shellfish allergy   Review of Systems Review of Systems  All other systems reviewed and are negative.    Physical Exam Updated Vital Signs BP 117/74 (BP Location: Right Arm)   Pulse 99   Temp 98.7 F (37.1 C) (Oral)   Resp 16   SpO2 96%   Physical Exam  Constitutional: He appears well-developed and well-nourished. No distress.  HENT:  Head: Normocephalic and atraumatic.  Right Ear:  External ear normal.  Left Ear: External ear normal.  Eyes: Conjunctivae are normal. Right eye exhibits no discharge. Left eye exhibits no discharge. No scleral icterus.  Neck: Neck supple. No tracheal deviation present.  Cardiovascular: Normal rate, regular rhythm and intact distal pulses.  Pulmonary/Chest: Effort normal and breath sounds normal. No stridor. No respiratory distress. He has no wheezes. He has no rales.  Abdominal: Soft. Bowel sounds are normal. He exhibits no distension. There is no tenderness. There is no rebound and no guarding.  Musculoskeletal: He exhibits no edema or tenderness.  Neurological: He is alert. He has normal strength. No cranial nerve deficit (no facial droop, extraocular  movements intact, no slurred speech) or sensory deficit. He exhibits normal muscle tone. He displays no seizure activity. Coordination normal.  Skin: Skin is warm and dry. No rash noted.  Psychiatric: His affect is blunt. His speech is delayed. He is slowed and withdrawn. He expresses no homicidal and no suicidal ideation. He expresses no suicidal plans and no homicidal plans. He is noncommunicative.  Patient initially would not answer any questions and was just staring at me, he eventually started nodding yes and no in response to questions, ultimately the patient did eventually speak to me  Nursing note and vitals reviewed.    ED Treatments / Results  Labs (all labs ordered are listed, but only abnormal results are displayed) Labs Reviewed - No data to display  EKG None  Radiology Dg Chest 2 View  Result Date: 02/23/2018 CLINICAL DATA:  Acute shortness of breath. EXAM: CHEST - 2 VIEW COMPARISON:  01/28/2018 and prior radiographs FINDINGS: The cardiomediastinal silhouette is unremarkable. Mild chronic peribronchial thickening noted. There is no evidence of focal airspace disease, pulmonary edema, suspicious pulmonary nodule/mass, pleural effusion, or pneumothorax. No acute bony abnormalities are identified. IMPRESSION: No active cardiopulmonary disease. Electronically Signed   By: Harmon Pier M.D.   On: 02/23/2018 15:20    Procedures Procedures (including critical care time)  Medications Ordered in ED Medications  benztropine (COGENTIN) tablet 1 mg (has no administration in time range)  hydrOXYzine (ATARAX/VISTARIL) tablet 50 mg (has no administration in time range)  loratadine (CLARITIN) tablet 10 mg (has no administration in time range)  montelukast (SINGULAIR) tablet 10 mg (has no administration in time range)  paliperidone (INVEGA) 24 hr tablet 6 mg (has no administration in time range)  traZODone (DESYREL) tablet 100 mg (has no administration in time range)     Initial  Impression / Assessment and Plan / ED Course  I have reviewed the triage vital signs and the nursing notes.  Pertinent labs & imaging results that were available during my care of the patient were reviewed by me and considered in my medical decision making (see chart for details).   Patient presented with recurrent anxiety issues likely as result of his chronic schizophrenia.  Patient denied any suicidal or homicidal ideation.  He was just assessed by psychiatry yesterday.  Plan was to given his psychiatric medications and consider reconsultation with psychiatry.  Patient did not meet any criteria for involuntary commitment.  Patient ended up deciding while he was still waiting to be assessed that he was feeling better and wanted to leave.  Final Clinical Impressions(s) / ED Diagnoses   Final diagnoses:  Schizophrenia, unspecified type [facility])    ED Discharge Orders    None       Linwood Dibbles, MD 02/25/18 1626

## 2018-02-25 NOTE — ED Provider Notes (Signed)
[facility]-EMER[facility]PT Provider Note   CSN: [id number] Arrival date & time: 02/25/18  1602     History   Chief Complaint Chief Complaint  Patient presents with  . Anxiety    HPI [name] is a 27 y.o. male.  HPI   Patient is a 27 year old male with a history of depression and schizophrenia who presents the emergency department today requesting psychiatric evaluation.  Patient states that he started having a panic attack earlier today and began having shortness of breath.  He states this feels consistent with his panic attacks that he has had in the past.  He denies any chest pain or pain with inspiration.  Denies any abdominal pain, nausea, vomiting, diarrhea, fevers or other medical complaints at this time he denies any recent URI symptoms.  He is denying suicidal or homicidal ideations.  He does endorse audio and visual hallucinations but will not expand on this.  He is requesting to speak with psychiatry at this time.  Patient endorses marijuana use prior to arrival but denies any other drug use.  No alcohol use.  Of note patient was recently seen in the ED earlier today for similar complaints.  At that time the plan was to give the patient his psychiatric medications and reconsider a consultation with psychiatry.  The patient ultimately decided to leave prior to being assessed by psychiatry as he stated he was feeling better. He presents now requesting a psychiatric evaluation.   Patient was also seen on 02/23/2018 for similar complaints.  He had a chest x-ray completed at that time which was negative for any infiltrates, pneumothorax, or active disease.  Past Medical History:  Diagnosis Date  . Depression   . Schizophrenia [facility])     Patient Active Problem List   Diagnosis Date Noted  . Schizophrenia, paranoid type (HCC) 02/14/2018  . Psychosis (HCC)   . Cannabis use disorder, moderate, dependence (HCC) 05/10/2016    Past Surgical History:    Procedure Laterality Date  . BACK SURGERY          Home Medications    Prior to Admission medications   Medication Sig Start Date End Date Taking? Authorizing Provider  albuterol (PROVENTIL HFA;VENTOLIN HFA) 108 (90 Base) MCG/ACT inhaler Inhale 2 puffs into the lungs every 4 (four) hours as needed for wheezing or shortness of breath (cough). 02/19/18   Armandina Stammer I, NP  benztropine (COGENTIN) 1 MG tablet Take 1 tablet (1 mg total) by mouth 2 (two) times daily as needed (EPS). 02/19/18   Nwoko, Nicole Kindred I, NP  hydrOXYzine (ATARAX/VISTARIL) 50 MG tablet Take 1 tablet (50 mg total) by mouth 3 (three) times daily as needed (mild anxiety). 02/19/18   Armandina Stammer I, NP  loratadine (CLARITIN) 10 MG tablet Take 1 tablet (10 mg total) by mouth daily. For allergies 02/20/18   Armandina Stammer I, NP  montelukast (SINGULAIR) 10 MG tablet Take 1 tablet (10 mg total) by mouth at bedtime. For Asthma 02/19/18   Armandina Stammer I, NP  paliperidone (INVEGA SUSTENNA) 156 MG/ML SUSY injection Inject 1 mL (156 mg total) into the muscle every 30 (thirty) days. (Due on 03-17-18): For mood stabilization 03/17/18   Armandina Stammer I, NP  paliperidone (INVEGA) 6 MG 24 hr tablet Take 1 tablet (6 mg total) by mouth at bedtime. For mood control 02/19/18   Armandina Stammer I, NP  traZODone (DESYREL) 100 MG tablet Take 1 tablet (100 mg total) by mouth at bedtime as needed for  sleep. 02/19/18   Sanjuana Kava, NP    Family History No family history on file.  Social History Social History   Tobacco Use  . Smoking status: Current Some Day Smoker  . Smokeless tobacco: Never Used  Substance Use Topics  . Alcohol use: No  . Drug use: Yes    Types: Marijuana    Comment: rare     Allergies   Shellfish allergy   Review of Systems Review of Systems  Constitutional: Negative for chills and fever.  HENT: Negative for ear pain and sore throat.   Eyes: Negative for pain and visual disturbance.  Respiratory: Positive for shortness of  breath. Negative for cough.   Cardiovascular: Negative for chest pain and palpitations.  Gastrointestinal: Negative for abdominal pain, constipation, diarrhea, nausea and vomiting.  Genitourinary: Negative for dysuria and hematuria.  Musculoskeletal: Negative for back pain.  Skin: Negative for color change and rash.  Neurological: Negative for headaches.  Psychiatric/Behavioral:       No SI, HI. Positive AVH. Positive anxiety.   All other systems reviewed and are negative.    Physical Exam Updated Vital Signs BP 116/75 (BP Location: Left Arm)   Pulse (!) 56   Temp 97.7 F (36.5 C) (Oral)   Resp 14   SpO2 93%   Physical Exam  Constitutional: He appears well-developed and well-nourished.  NAD  HENT:  Head: Normocephalic and atraumatic.  Eyes: Conjunctivae are normal.  Neck: Neck supple.  Cardiovascular: Normal rate, regular rhythm and normal heart sounds.  No murmur heard. Pulmonary/Chest: Effort normal and breath sounds normal. No respiratory distress. He has no wheezes.  No respiratory distress  Abdominal: Soft. Bowel sounds are normal. He exhibits no distension. There is no tenderness. There is no guarding.  Musculoskeletal: He exhibits no edema.  Neurological: He is alert.  Skin: Skin is warm and dry. Capillary refill takes less than 2 seconds.  Psychiatric:  Appears anxious on exam and initially will not answer questions. Eventually begins to answer questions. Pt has blunt affect and responds in one word answers.   Nursing note and vitals reviewed.   ED Treatments / Results  Labs (all labs ordered are listed, but only abnormal results are displayed) Labs Reviewed  COMPREHENSIVE METABOLIC PANEL - Abnormal; Notable for the following components:      Result Value   Glucose, Bld 136 (*)    ALT 12 (*)    Alkaline Phosphatase 36 (*)    All other components within normal limits  RAPID URINE DRUG SCREEN, HOSP PERFORMED - Abnormal; Notable for the following components:     Tetrahydrocannabinol POSITIVE (*)    All other components within normal limits  ACETAMINOPHEN LEVEL - Abnormal; Notable for the following components:   Acetaminophen (Tylenol), Serum <10 (*)    All other components within normal limits  CBC WITH DIFFERENTIAL/PLATELET  SALICYLATE LEVEL  ETHANOL    EKG None  Radiology No results found.  Procedures Procedures (including critical care time)  Medications Ordered in ED Medications - No data to display   Initial Impression / Assessment and Plan / ED Course  I have reviewed the triage vital signs and the nursing notes.  Pertinent labs & imaging results that were available during my care of the patient were reviewed by me and considered in my medical decision making (see chart for details).  Final Clinical Impressions(s) / ED Diagnoses   Final diagnoses:  Anxiety   Pt presenting complaining of panic attack that began prior  to arrival.  He will not elaborate on what caused his panic attack.  Vital signs are stable and he is afebrile in the ED.  He is in no acute distress on exam. His cardiopulmonary exam is WNL and he is satting well on room air.  He is not tachypnic and is in no respiratory distress. He is denying SI, HI, but does endorse AVH.  Will not elaborate further on his hallucinations.  Patient had presented to the ED earlier today for similar complaints and later decided that he was feeling better and decided to be discharged home prior to [facility] assessment.  He presented again today requesting to be assessed by psychiatry. Pt was assessed by psychiatry and he did not meet inpatient criteria. He was advised by psychiatry to follow up with [facility] and to continue his home medications.   Lab work shows no leukocytosis.  CMP is benign with normal creatinine and liver function.  Salicylate and acetaminophen-levels are normal.  Ethanol level is negative.  UDS positive for marijuana.  He had a chest x-ray completed 2 days ago which was  negative for any acute abnormalities. Pt is PERC negative. Do not suspect ACS in this patient and do not suspect any other acute cardiopulmonary process at this time.   Patient was reevaluated in case he continues to deny suicidal ideations and homicidal ideations.  He has been comfortably sleeping throughout the majority of his emergency department stay.  He has been in no acute distress.  Feel patient is safe for discharge and do not feel that he requires any intervention for any emergent pathology at this time.  Gave him outpatient resources for counseling and gave him information to follow-up with [facility].  He agrees to do so.  Advised him to return to the ER if he has any thoughts of harming himself or others or if he has any continued symptoms.  ED Discharge Orders    None       Rayne Du 02/26/18 0116    [name], MD 02/26/18 [phone]

## 2018-02-25 NOTE — ED Notes (Signed)
Pt asleep on the floor in triage 3.

## 2018-02-25 NOTE — ED Notes (Signed)
Pt told this nurse he wanted to leave and walked out of door.

## 2018-02-25 NOTE — [facility] Assessment (Signed)
[facility] Assessment Progress Note 1213 hours: Patient is observed to be sedated or impaired and will not participate in the assessment. Patient will be seen later this date.

## 2018-02-25 NOTE — ED Notes (Signed)
Bed: WLPT4 Expected date:  Expected time:  Means of arrival:  Comments: 

## 2018-02-25 NOTE — ED Triage Notes (Signed)
Per EMS, pt told EMS he could not breathe. Pt was not answering questions for EMS. Pt called EMS, was pciked up on sidewalk, pt requested to go to [facility].   BP 118/78 O2 98 on RA RR 18

## 2018-02-26 ENCOUNTER — Other Ambulatory Visit: Payer: Self-pay

## 2018-02-26 NOTE — ED Notes (Signed)
Spoke with pts guardian to inform her that pt is being discharged. Guardian states she is unable to pick up pt because she is working until 8am and there are no other available family members. Pt will be discharged to lobby and guardian will pick him up when she gets off work.

## 2018-02-26 NOTE — Discharge Instructions (Signed)
Please follow up with your primary care provider within 5-7 days for re-evaluation of your symptoms. If you do not have a primary care provider, information for a healthcare clinic has been provided for you to make arrangements for follow up care. Please return to the emergency department for any new or worsening symptoms. Return to the emergency department immediately if you have any suicidal thoughts, thoughts of harming yourself, thoughts of harming others, or any new or worsening symptoms.

## 2018-02-26 NOTE — ED Notes (Signed)
Spoke with guardian reports that she will pick patient up when she gets off work.

## 2018-02-27 ENCOUNTER — Emergency Department (HOSPITAL_COMMUNITY)
Admission: EM | Admit: 2018-02-27 | Discharge: 2018-02-27 | Disposition: A | Discharge status: Home / Self Care | Payer: Medicaid Other | Attending: Emergency Medicine | Admitting: Emergency Medicine

## 2018-02-27 DIAGNOSIS — Z79899 Other long term (current) drug therapy: Secondary | ICD-10-CM | POA: Diagnosis not present

## 2018-02-27 DIAGNOSIS — F329 Major depressive disorder, single episode, unspecified: Secondary | ICD-10-CM | POA: Diagnosis not present

## 2018-02-27 DIAGNOSIS — F410 Panic disorder [episodic paroxysmal anxiety] without agoraphobia: Secondary | ICD-10-CM | POA: Diagnosis present

## 2018-02-27 DIAGNOSIS — F17200 Nicotine dependence, unspecified, uncomplicated: Secondary | ICD-10-CM | POA: Diagnosis not present

## 2018-02-27 DIAGNOSIS — F200 Paranoid schizophrenia: Secondary | ICD-10-CM | POA: Insufficient documentation

## 2018-02-27 DIAGNOSIS — F22 Delusional disorders: Secondary | ICD-10-CM

## 2018-02-27 LAB — CBG MONITORING, ED: Glucose-Capillary: 93 mg/dL (ref 65–99)

## 2018-02-27 MED ORDER — HYDROXYZINE HCL 25 MG PO TABS
25.0000 mg | ORAL_TABLET | Freq: Once | ORAL | Status: AC
  Administered 2018-02-27: 25 mg via ORAL
  Filled 2018-02-27: qty 1

## 2018-02-27 NOTE — ED Provider Notes (Addendum)
Patient is chronically paranoid suffers from schizophrenia. Reports that he does not currently have a psychiatrist. He became more paranoid today after smoking marijuana. He denies point to harm himself or anyone else. Otherwiseasymptomatic. Patient is alert Glasgow Coma Score 15 and was felt difficulty and appears in no distress. He was given a meal here, which he ate in fDoug Souwitz, Rona Tomson, MD 02/27/18 1515 CBG 93, normal. Patient will be furnished with resource guide for psychiatric help. Diagnosis #1 paranoia #2 substance abuse   Doug Sou, MD 02/27/18 1549

## 2018-02-27 NOTE — ED Notes (Signed)
Pt discharged safely with Bus pass.  Pt was in no distress at discharge.  Pt left ambulatory with all belongings.

## 2018-02-27 NOTE — ED Provider Notes (Signed)
[facility]-EMER[facility]PT Provider Note   CSN: [id number] Arrival date & time: 02/27/18  1344     History   Chief Complaint No chief complaint on file.   HPI [name] is a 27 y.o. male.  HPI  27 year old male with history of schizophrenia, depression, and alcohol use presenting here for evaluation of a panic attack.  Patient with known history of panic attack, was last seen in the ER for panic attack 2 days ago.  At that time, he denies any SI/HI but does endorse hallucination.  He was evaluated by psychiatry and he did not meet inpatient criteria for admission.  He was subsequently discharged home with recommendation to follow-up with [facility] for further management..  Furthermore, he was seen on 5/18 for similar complaints.  His evaluation at that time was unremarkable.  He is back again today with complaints of symptoms of panic attack.  Patient report after using marijuana today, he developed panic attack.  He does not go into detail exactly how he feels but states that he feels panicky.  He mention fearing for his life.  He does not describe if anyone in specific is trying to harm him.  He denies homicidal or suicidal ideation.  He denies having any active pain.  History is limited.  Past Medical History:  Diagnosis Date  . Depression   . Schizophrenia [facility])     Patient Active Problem List   Diagnosis Date Noted  . Schizophrenia, paranoid type (HCC) 02/14/2018  . Psychosis (HCC)   . Cannabis use disorder, moderate, dependence (HCC) 05/10/2016    Past Surgical History:  Procedure Laterality Date  . BACK SURGERY          Home Medications    Prior to Admission medications   Medication Sig Start Date End Date Taking? Authorizing Provider  albuterol (PROVENTIL HFA;VENTOLIN HFA) 108 (90 Base) MCG/ACT inhaler Inhale 2 puffs into the lungs every 4 (four) hours as needed for wheezing or shortness of breath (cough). 02/19/18   Armandina Stammer I, NP    benztropine (COGENTIN) 1 MG tablet Take 1 tablet (1 mg total) by mouth 2 (two) times daily as needed (EPS). 02/19/18   Nwoko, Nicole Kindred I, NP  hydrOXYzine (ATARAX/VISTARIL) 50 MG tablet Take 1 tablet (50 mg total) by mouth 3 (three) times daily as needed (mild anxiety). 02/19/18   Armandina Stammer I, NP  loratadine (CLARITIN) 10 MG tablet Take 1 tablet (10 mg total) by mouth daily. For allergies 02/20/18   Armandina Stammer I, NP  montelukast (SINGULAIR) 10 MG tablet Take 1 tablet (10 mg total) by mouth at bedtime. For Asthma 02/19/18   Armandina Stammer I, NP  paliperidone (INVEGA SUSTENNA) 156 MG/ML SUSY injection Inject 1 mL (156 mg total) into the muscle every 30 (thirty) days. (Due on 03-17-18): For mood stabilization 03/17/18   Armandina Stammer I, NP  paliperidone (INVEGA) 6 MG 24 hr tablet Take 1 tablet (6 mg total) by mouth at bedtime. For mood control 02/19/18   Armandina Stammer I, NP  traZODone (DESYREL) 100 MG tablet Take 1 tablet (100 mg total) by mouth at bedtime as needed for sleep. 02/19/18   Sanjuana Kava, NP    Family History No family history on file.  Social History Social History   Tobacco Use  . Smoking status: Current Some Day Smoker  . Smokeless tobacco: Never Used  Substance Use Topics  . Alcohol use: No  . Drug use: Yes    Types: Marijuana  Comment: rare     Allergies   Shellfish allergy   Review of Systems Review of Systems  Unable to perform ROS: Psychiatric disorder     Physical Exam Updated Vital Signs BP 112/84 (BP Location: Right Arm)   Pulse 73   Temp 98.5 F (36.9 C) (Oral)   Resp 16   SpO2 98%   Physical Exam  Constitutional: He appears well-developed and well-nourished. No distress.  HENT:  Head: Atraumatic.  Eyes: Conjunctivae are normal.  Neck: Neck supple.  Cardiovascular: Normal rate and regular rhythm.  Pulmonary/Chest: Effort normal and breath sounds normal.  Abdominal: Soft.  Neurological: He is alert. GCS eye subscore is 4. GCS verbal subscore is 5.  GCS motor subscore is 6.  Skin: No rash noted.  Psychiatric: His affect is blunt. His speech is delayed. He is slowed and withdrawn. Thought content is paranoid. He expresses inappropriate judgment. He expresses no homicidal and no suicidal ideation.  Nursing note and vitals reviewed.    ED Treatments / Results  Labs (all labs ordered are listed, but only abnormal results are displayed) Labs Reviewed  CBG MONITORING, ED    EKG None  Radiology No results found.  Procedures Procedures (including critical care time)  Medications Ordered in ED Medications - No data to display   Initial Impression / Assessment and Plan / ED Course  I have reviewed the triage vital signs and the nursing notes.  Pertinent labs & imaging results that were available during my care of the patient were reviewed by me and considered in my medical decision making (see chart for details).    BP 112/84 (BP Location: Right Arm)   Pulse 73   Temp 98.5 F (36.9 C) (Oral)   Resp 16   SpO2 98%    Final Clinical Impressions(s) / ED Diagnoses   Final diagnoses:  Paranoid behavior [facility])    ED Discharge Orders    None     2:17 PM Patient with history of panic attack and anxiety as well as history of schizophrenia and depression.  He is here due to having panic attack after using marijuana.  He has been seen in the ED multiple times for similar panic attack, most recent was 2 days ago.  At that time, the psychiatric team did evaluate patient and felt that he does not meet inpatient criteria for admission and recommend patient to follow-up with [facility].  Current plan is to provide Vistaril to help with his anxiety.  Anticipating discharge   Fayrene Helper, Cordelia Poche 02/27/18 1559    Doug Sou, MD 02/27/18 [phone]

## 2018-02-27 NOTE — ED Triage Notes (Signed)
Per EMS, pt is coming from home with reports of anxiety. EMS reports that pt is paranoid and used marijuana prior to EMS arrival. EMS states that pt is not answering questions but does not appear to be combative. Pt has a hx of schizophrenia, anxiety disorder, and asthma.

## 2018-02-27 NOTE — Discharge Instructions (Addendum)
Avoid marijuana and other illicit drugs. Marijuana causes paranoiaCall any of the numbers listed to get psychiatric help and to get help with your drug problem

## 2018-07-05 ENCOUNTER — Encounter (HOSPITAL_COMMUNITY): Payer: Self-pay

## 2018-07-05 ENCOUNTER — Emergency Department (HOSPITAL_COMMUNITY)
Admission: EM | Admit: 2018-07-05 | Discharge: 2018-07-05 | Disposition: A | Discharge status: Home / Self Care | Payer: Medicaid Other | Attending: Emergency Medicine | Admitting: Emergency Medicine

## 2018-07-05 ENCOUNTER — Other Ambulatory Visit: Payer: Self-pay

## 2018-07-05 DIAGNOSIS — F430 Acute stress reaction: Secondary | ICD-10-CM | POA: Insufficient documentation

## 2018-07-05 DIAGNOSIS — F200 Paranoid schizophrenia: Secondary | ICD-10-CM | POA: Diagnosis not present

## 2018-07-05 DIAGNOSIS — Z590 Homelessness: Secondary | ICD-10-CM | POA: Insufficient documentation

## 2018-07-05 DIAGNOSIS — F1290 Cannabis use, unspecified, uncomplicated: Secondary | ICD-10-CM | POA: Diagnosis not present

## 2018-07-05 DIAGNOSIS — F17210 Nicotine dependence, cigarettes, uncomplicated: Secondary | ICD-10-CM | POA: Diagnosis not present

## 2018-07-05 DIAGNOSIS — Z79899 Other long term (current) drug therapy: Secondary | ICD-10-CM | POA: Insufficient documentation

## 2018-07-05 LAB — RAPID URINE DRUG SCREEN, HOSP PERFORMED
Amphetamines: NOT DETECTED
Barbiturates: NOT DETECTED
Benzodiazepines: NOT DETECTED
Cocaine: NOT DETECTED
Opiates: NOT DETECTED
Tetrahydrocannabinol: POSITIVE — AB

## 2018-07-05 LAB — COMPREHENSIVE METABOLIC PANEL
ALT: 9 U/L (ref 0–44)
AST: 19 U/L (ref 15–41)
Albumin: 4.2 g/dL (ref 3.5–5.0)
Alkaline Phosphatase: 37 U/L — ABNORMAL LOW (ref 38–126)
Anion gap: 10 (ref 5–15)
BUN: 14 mg/dL (ref 6–20)
CO2: 27 mmol/L (ref 22–32)
Calcium: 9.4 mg/dL (ref 8.9–10.3)
Chloride: 104 mmol/L (ref 98–111)
Creatinine, Ser: 1.19 mg/dL (ref 0.61–1.24)
GFR calc Af Amer: >60 mL/min (ref >60)
GFR calc non Af Amer: >60 mL/min (ref >60)
Glucose, Bld: 86 mg/dL (ref 70–99)
Potassium: 4 mmol/L (ref 3.5–5.1)
Sodium: 141 mmol/L (ref 135–145)
Total Bilirubin: 0.8 mg/dL (ref 0.3–1.2)
Total Protein: 6.7 g/dL (ref 6.5–8.1)

## 2018-07-05 LAB — CBC
HCT: 42.3 % (ref 39.0–52.0)
Hemoglobin: 14.9 g/dL (ref 13.0–17.0)
MCH: 31.2 pg (ref 26.0–34.0)
MCHC: 35.2 g/dL (ref 30.0–36.0)
MCV: 88.7 fL (ref 78.0–100.0)
Platelets: 228 10*3/uL (ref 150–400)
RBC: 4.77 MIL/uL (ref 4.22–5.81)
RDW: 12.9 % (ref 11.5–15.5)
WBC: 6.3 10*3/uL (ref 4.0–10.5)

## 2018-07-05 LAB — ETHANOL: Alcohol, Ethyl (B): <10 mg/dL (ref <10)

## 2018-07-05 LAB — ACETAMINOPHEN LEVEL: Acetaminophen (Tylenol), Serum: <10 ug/mL — ABNORMAL LOW (ref 10–30)

## 2018-07-05 LAB — SALICYLATE LEVEL: Salicylate Lvl: <7 mg/dL (ref 2.8–30.0)

## 2018-07-05 MED ORDER — ALBUTEROL SULFATE HFA 108 (90 BASE) MCG/ACT IN AERS
1.0000 | INHALATION_SPRAY | Freq: Four times a day (QID) | RESPIRATORY_TRACT | Status: DC | PRN
  Administered 2018-07-05: 1 via RESPIRATORY_TRACT
  Filled 2018-07-05: qty 6.7

## 2018-07-05 MED ORDER — HYDROXYZINE HCL 25 MG PO TABS: 25.0000 mg | ORAL_TABLET | Freq: Once | ORAL | Status: DC

## 2018-07-05 MED ORDER — AEROCHAMBER Z-STAT PLUS/MEDIUM MISC
1.0000 | Freq: Once | Status: AC
  Administered 2018-07-05: 1
  Filled 2018-07-05 (×2): qty 1

## 2018-07-05 MED ORDER — PREDNISONE 20 MG PO TABS: 40.0000 mg | ORAL_TABLET | Freq: Every day | ORAL | 0 refills | Status: AC

## 2018-07-05 MED ORDER — HYDROXYZINE HCL 25 MG PO TABS: 25.0000 mg | ORAL_TABLET | Freq: Four times a day (QID) | ORAL | 0 refills | Status: DC

## 2018-07-05 MED ORDER — PREDNISONE 20 MG PO TABS
40.0000 mg | ORAL_TABLET | Freq: Once | ORAL | Status: AC
  Administered 2018-07-05: 40 mg via ORAL
  Filled 2018-07-05: qty 2

## 2018-07-05 NOTE — ED Notes (Signed)
Pt's belongings placed over by the nurse's station in triage. Pt has one dollar, a phone with a broken screen, a phone charger, and some clothes.

## 2018-07-05 NOTE — ED Triage Notes (Signed)
Pt reports that he is schizophrenic, homeless, and needs a place to sleep. He states that he doesn't know if he wants to harm himself or not. He denies HI. Reports compliance with medication, but states that he needs something stronger.
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
## 2018-07-05 NOTE — Discharge Instructions (Addendum)
Please return the emergency department if you develop any increase in stress, thoughts of harming yourself or thoughts of harming anyone else.  You are prescribed prednisone, 40 mg daily.  Please take this for 4 days.  This medication can make you jittery, so please take the hydroxyzine as needed every 6 hours if you experience anxiety.  You need to take your inhaler every 6 hours for the next 5 days.  You need follow-up for your asthma.  I placed a consult to case management.  There are also resources on your paperwork to establish primary care.  Thank you for allowing [place] to participate in your care today.

## 2018-07-05 NOTE — ED Notes (Signed)
Bed: WA30 Expected date:  Expected time:  Means of arrival:  Comments: 

## 2018-07-05 NOTE — Progress Notes (Addendum)
CSW received a request from the [employer] to assist with disposition for the pt once pt is medically cleared.  CSW received verbal permission from the pt to call his contacts after telling pt he should be ready for D/C shortly, per the [employer].  CSW called pt listed as his Legal Irving Shows at ph: [phone].  Miss York stated her name is actually [name] and stated she is not the legal guardian, that the pt is his own legal guardian and that Miss Lck is is Aunt and the pt's "payee" and handles his correspondence for the pt but that he is his on legal guardian.  Miss York stated her daughter has her car and she can't p/u the pt, but that the pt can return when D/C'd to live at home.  CSW updated the [employer] who will finish treating asthma issues for the pt an if no other issues arise will D/C the pt.  CSW will provide the pt's RN with the pt's taxi voucher and will call [employer] at ph: [phone] and request a taxi and stating RN has a taxi voucher to be used.  RN/EPD aware.  Voucher provided.  Pt updated. Pt was told a taxi would be provided and that his Aunt is welcoming the pt home once the [employer] delivered any needed asthma treatment/meds and pt is D/C'd.  Pt did not present as happy when the news was delivered but nodded in the affirmative and said, "Okay".  CSW thanked the pt for his patience and updated the RN as to the pt's reaction to the news he will be D/C home via taxi.  Please reconsult if future social work needs arise.  CSW signing off, as social work intervention is no longer needed.  Dorothe Pea. Tiffiney Sparrow, LCSW, LCAS, CSI Clinical Social Worker Ph: [phone]

## 2018-07-05 NOTE — ED Notes (Signed)
Christiane Ha, CSW, to [facility].

## 2018-07-05 NOTE — ED Notes (Signed)
Pt provided ham sandwich & Sprite.
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
## 2018-07-24 ENCOUNTER — Emergency Department (HOSPITAL_COMMUNITY)
Admission: EM | Admit: 2018-07-24 | Discharge: 2018-07-25 | Disposition: A | Discharge status: Home / Self Care | Payer: Medicaid Other | Attending: Emergency Medicine | Admitting: Emergency Medicine

## 2018-07-24 ENCOUNTER — Encounter (HOSPITAL_COMMUNITY): Payer: Self-pay | Admitting: Emergency Medicine

## 2018-07-24 ENCOUNTER — Emergency Department (HOSPITAL_COMMUNITY): Payer: Medicaid Other

## 2018-07-24 DIAGNOSIS — R0602 Shortness of breath: Secondary | ICD-10-CM | POA: Insufficient documentation

## 2018-07-24 DIAGNOSIS — F251 Schizoaffective disorder, depressive type: Secondary | ICD-10-CM | POA: Diagnosis not present

## 2018-07-24 DIAGNOSIS — F17210 Nicotine dependence, cigarettes, uncomplicated: Secondary | ICD-10-CM | POA: Insufficient documentation

## 2018-07-24 DIAGNOSIS — Z79899 Other long term (current) drug therapy: Secondary | ICD-10-CM | POA: Diagnosis not present

## 2018-07-24 MED ORDER — DEXAMETHASONE 4 MG PO TABS
10.0000 mg | ORAL_TABLET | Freq: Once | ORAL | Status: AC
  Administered 2018-07-24: 10 mg via ORAL
  Filled 2018-07-24: qty 2

## 2018-07-24 MED ORDER — ONDANSETRON 8 MG PO TBDP
8.0000 mg | ORAL_TABLET | Freq: Once | ORAL | Status: AC
  Administered 2018-07-24: 8 mg via ORAL
  Filled 2018-07-24: qty 1

## 2018-07-24 NOTE — ED Notes (Signed)
Patient transported to X-ray 

## 2018-07-24 NOTE — ED Notes (Signed)
Bed: ZO10 Expected date:  Expected time:  Means of arrival:  Comments: EMS asthma / Psych Hx

## 2018-07-24 NOTE — ED Provider Notes (Signed)
[facility]-EMERGENCY DEPT Provider Note   CSN: [id number] Arrival date & time: 07/24/18  2158     History   Chief Complaint Chief Complaint  Patient presents with  . Shortness of Breath    HPI [name] is a 27 y.o. male.   27 year old male with a history of depression and schizophrenia presents to the emergency department from home complaining of shortness of breath.  Reports history of asthma and was given an inhaler at his last visit, but states that he does not know how to use this.  He states that he generally does not feel well.  This began earlier today.  Notes one episode of vomiting this morning.  Is, otherwise, not very forthcoming with his symptoms.  Was given 10 albuterol and 0.5 Atrovent via EMS in route.  No fevers, congestion, diarrhea.     Past Medical History:  Diagnosis Date  . Depression   . Schizophrenia [facility])     Patient Active Problem List   Diagnosis Date Noted  . Schizophrenia, paranoid type (HCC) 02/14/2018  . Psychosis (HCC)   . Cannabis use disorder, moderate, dependence (HCC) 05/10/2016    Past Surgical History:  Procedure Laterality Date  . BACK SURGERY          Home Medications    Prior to Admission medications   Medication Sig Start Date End Date Taking? Authorizing Provider  albuterol (PROVENTIL HFA;VENTOLIN HFA) 108 (90 Base) MCG/ACT inhaler Inhale 2 puffs into the lungs every 4 (four) hours as needed for wheezing or shortness of breath (cough). 02/19/18   Armandina Stammer I, NP  benztropine (COGENTIN) 1 MG tablet Take 1 tablet (1 mg total) by mouth 2 (two) times daily as needed (EPS). 02/19/18   Armandina Stammer I, NP  hydrOXYzine (ATARAX/VISTARIL) 25 MG tablet Take 1 tablet (25 mg total) by mouth every 6 (six) hours. 07/05/18   Aviva Kluver B, PA-C  loratadine (CLARITIN) 10 MG tablet Take 1 tablet (10 mg total) by mouth daily. For allergies 02/20/18   Armandina Stammer I, NP  montelukast (SINGULAIR) 10 MG tablet Take 1  tablet (10 mg total) by mouth at bedtime. For Asthma 02/19/18   Armandina Stammer I, NP  paliperidone (INVEGA SUSTENNA) 156 MG/ML SUSY injection Inject 1 mL (156 mg total) into the muscle every 30 (thirty) days. (Due on 03-17-18): For mood stabilization 03/17/18   Armandina Stammer I, NP  paliperidone (INVEGA) 6 MG 24 hr tablet Take 1 tablet (6 mg total) by mouth at bedtime. For mood control 02/19/18   Armandina Stammer I, NP  traZODone (DESYREL) 100 MG tablet Take 1 tablet (100 mg total) by mouth at bedtime as needed for sleep. 02/19/18   Sanjuana Kava, NP    Family History History reviewed. No pertinent family history.  Social History Social History   Tobacco Use  . Smoking status: Current Some Day Smoker  . Smokeless tobacco: Never Used  Substance Use Topics  . Alcohol use: No  . Drug use: Yes    Types: Marijuana    Comment: rare     Allergies   Shellfish allergy   Review of Systems Review of Systems Ten systems reviewed and are negative for acute change, except as noted in the HPI.    Physical Exam Updated Vital Signs BP 118/78 (BP Location: Left Arm)   Pulse 66   Temp 98.1 F (36.7 C) (Oral)   Resp 17   SpO2 96%   Physical Exam  Constitutional: He  is oriented to person, place, and time. He appears well-developed and well-nourished. No distress.   Nontoxic. Facial tattoos.  HENT:  Head: Normocephalic and atraumatic.  Eyes: Conjunctivae and EOM are normal. No scleral icterus.  Neck: Normal range of motion.  Cardiovascular: Regular rhythm and intact distal pulses.  Mild tachycardia  Pulmonary/Chest: Effort normal. No stridor. No respiratory distress. He has no wheezes. He has no rales.  Lungs are grossly clear. No signs of respiratory distress.  Abdominal: He exhibits no distension.  Musculoskeletal: Normal range of motion.  Neurological: He is alert and oriented to person, place, and time. He exhibits normal muscle tone. Coordination normal.  Skin: Skin is warm and dry. No rash  noted. He is not diaphoretic. No erythema. No pallor.  Psychiatric: He has a normal mood and affect. His speech is normal. He is withdrawn. Thought content is paranoid (mild).  Nursing note and vitals reviewed.    ED Treatments / Results  Labs (all labs ordered are listed, but only abnormal results are displayed) Labs Reviewed - No data to display  EKG None  Radiology Dg Chest 2 View  Result Date: 07/24/2018 CLINICAL DATA:  Shortness of breath EXAM: CHEST - 2 VIEW COMPARISON:  Chest radiograph 02/23/2018 FINDINGS: The heart size and mediastinal contours are within normal limits. Both lungs are clear. The visualized skeletal structures are unremarkable. IMPRESSION: Clear lungs Electronically Signed   By: Deatra Robinson M.D.   On: 07/24/2018 22:51    Procedures Procedures (including critical care time)  Medications Ordered in ED Medications  ondansetron (ZOFRAN-ODT) disintegrating tablet 8 mg (8 mg Oral Given 07/24/18 2231)  dexamethasone (DECADRON) tablet 10 mg (10 mg Oral Given 07/24/18 2231)     Initial Impression / Assessment and Plan / ED Course  I have reviewed the triage vital signs and the nursing notes.  Pertinent labs & imaging results that were available during my care of the patient were reviewed by me and considered in my medical decision making (see chart for details).     26 year old male presents to the emergency department for evaluation of shortness of breath.  He was given a DuoNeb treatment by EMS prior to arrival.  Supplemented with Decadron upon arrival to the ED.  Also complains of nausea which was treated with Zofran.  The patient has clear lung sounds on initial and repeat examination.  He has no hypoxia or signs of respiratory distress.  Suspect asthma exacerbation as cause of symptoms today.  The patient has been sleeping comfortably with no additional complaints.  Tolerating PO fluids.  Chest x-ray without evidence of focal consolidation, pneumonia,  pleural effusion, pneumothorax.  I believe the patient is appropriate for continued outpatient follow-up with his primary care doctor.  Return precautions discussed and provided. Patient discharged in stable condition with no unaddressed concerns.   Final Clinical Impressions(s) / ED Diagnoses   Final diagnoses:  SOB (shortness of breath)    ED Discharge Orders    None       Antony Madura, PA-C 07/25/18 0202    Tilden Fossa, MD 07/28/18 [phone]

## 2018-07-24 NOTE — ED Triage Notes (Addendum)
Per EMS, pt. From home with complaint of sob . Pt. Has hx of asthma and schizophrenia. Per caregiver, pt. Has been compliant of his psych meds but not on his asthma medicines. Pt. Stated that SOB started few days ago but  is getting worst this evening. Also reported of being nauseous. A/o x4. Calm and cooperative.denied SI/HI. Received a total of 10 albuterol and 0.5 atrovent via EMS. claimed a little relief upon arrival to ED.

## 2018-07-25 NOTE — Discharge Instructions (Addendum)
Continue your daily prescribed medications.  Follow-up with your primary care doctor.

## 2018-07-25 NOTE — ED Notes (Addendum)
Pt ambulated from room to hall way and past charge nurse desk and back to bed UNAD. O2sats remain between 95-98%, pulse 98-108

## 2018-11-15 ENCOUNTER — Encounter (HOSPITAL_COMMUNITY): Payer: Self-pay | Admitting: Emergency Medicine

## 2018-11-15 ENCOUNTER — Emergency Department (HOSPITAL_COMMUNITY)
Admission: EM | Admit: 2018-11-15 | Discharge: 2018-11-16 | Disposition: A | Discharge status: Home / Self Care | Payer: Medicaid Other | Attending: Emergency Medicine | Admitting: Emergency Medicine

## 2018-11-15 DIAGNOSIS — F200 Paranoid schizophrenia: Secondary | ICD-10-CM | POA: Diagnosis not present

## 2018-11-15 DIAGNOSIS — F2089 Other schizophrenia: Secondary | ICD-10-CM | POA: Diagnosis present

## 2018-11-15 DIAGNOSIS — R440 Auditory hallucinations: Secondary | ICD-10-CM | POA: Diagnosis present

## 2018-11-15 DIAGNOSIS — F209 Schizophrenia, unspecified: Secondary | ICD-10-CM

## 2018-11-15 DIAGNOSIS — F17200 Nicotine dependence, unspecified, uncomplicated: Secondary | ICD-10-CM | POA: Insufficient documentation

## 2018-11-15 LAB — RAPID URINE DRUG SCREEN, HOSP PERFORMED
Amphetamines: NOT DETECTED
Barbiturates: NOT DETECTED
Benzodiazepines: NOT DETECTED
Cocaine: NOT DETECTED
Opiates: NOT DETECTED
Tetrahydrocannabinol: POSITIVE — AB

## 2018-11-15 LAB — CBC
HCT: 45.8 % (ref 39.0–52.0)
Hemoglobin: 15.1 g/dL (ref 13.0–17.0)
MCH: 30 pg (ref 26.0–34.0)
MCHC: 33 g/dL (ref 30.0–36.0)
MCV: 90.9 fL (ref 80.0–100.0)
Platelets: 220 10*3/uL (ref 150–400)
RBC: 5.04 MIL/uL (ref 4.22–5.81)
RDW: 13.1 % (ref 11.5–15.5)
WBC: 5.7 10*3/uL (ref 4.0–10.5)
nRBC: 0 % (ref 0.0–0.2)

## 2018-11-15 LAB — ETHANOL: Alcohol, Ethyl (B): <10 mg/dL (ref <10)

## 2018-11-15 LAB — COMPREHENSIVE METABOLIC PANEL
ALT: 12 U/L (ref 0–44)
AST: 18 U/L (ref 15–41)
Albumin: 4.8 g/dL (ref 3.5–5.0)
Alkaline Phosphatase: 33 U/L — ABNORMAL LOW (ref 38–126)
Anion gap: 8 (ref 5–15)
BUN: 15 mg/dL (ref 6–20)
CO2: 29 mmol/L (ref 22–32)
Calcium: 9.2 mg/dL (ref 8.9–10.3)
Chloride: 102 mmol/L (ref 98–111)
Creatinine, Ser: 1.18 mg/dL (ref 0.61–1.24)
GFR calc Af Amer: >60 mL/min (ref >60)
GFR calc non Af Amer: >60 mL/min (ref >60)
Glucose, Bld: 108 mg/dL — ABNORMAL HIGH (ref 70–99)
Potassium: 3.6 mmol/L (ref 3.5–5.1)
Sodium: 139 mmol/L (ref 135–145)
Total Bilirubin: 0.4 mg/dL (ref 0.3–1.2)
Total Protein: 7.6 g/dL (ref 6.5–8.1)

## 2018-11-15 MED ORDER — TRAZODONE HCL 100 MG PO TABS
100.0000 mg | ORAL_TABLET | Freq: Every day | ORAL | Status: DC
  Administered 2018-11-15: 100 mg via ORAL
  Filled 2018-11-15: qty 1

## 2018-11-15 MED ORDER — PALIPERIDONE PALMITATE ER 156 MG/ML IM SUSY
156.0000 mg | PREFILLED_SYRINGE | Freq: Once | INTRAMUSCULAR | Status: AC
  Administered 2018-11-16: 156 mg via INTRAMUSCULAR
  Filled 2018-11-15: qty 1

## 2018-11-15 MED ORDER — HYDROXYZINE HCL 25 MG PO TABS: 25.0000 mg | ORAL_TABLET | Freq: Three times a day (TID) | ORAL | Status: DC | PRN

## 2018-11-15 MED ORDER — BENZTROPINE MESYLATE 1 MG PO TABS
1.0000 mg | ORAL_TABLET | Freq: Two times a day (BID) | ORAL | Status: DC
  Administered 2018-11-15 – 2018-11-16 (×2): 1 mg via ORAL
  Filled 2018-11-15 (×2): qty 1

## 2018-11-15 MED ORDER — PALIPERIDONE ER 6 MG PO TB24
6.0000 mg | ORAL_TABLET | Freq: Every day | ORAL | Status: DC
  Administered 2018-11-15 – 2018-11-16 (×2): 6 mg via ORAL
  Filled 2018-11-15 (×2): qty 1

## 2018-11-15 NOTE — ED Notes (Signed)
Bed: WBH39 Expected date:  Expected time:  Means of arrival:  Comments: 

## 2018-11-15 NOTE — ED Notes (Signed)
Bed: WA27 Expected date:  Expected time:  Means of arrival:  Comments: EMS-hallucinations

## 2018-11-15 NOTE — ED Notes (Signed)
Pt encouraged to provide urine per MD order. Specimen cup provided.

## 2018-11-15 NOTE — ED Provider Notes (Signed)
[facility]-EMERGENCY DEPT Provider Note   CSN: [id number] Arrival date & time: 11/15/18  1607     History   Chief Complaint Chief Complaint  Patient presents with  . Hallucinations  . Anxiety    HPI [name] is a 28 y.o. male. Level 5 caveat due to psychiatric disorder. HPI Patient presents because he feels light.  States he needs a shot to help feel better.  Does have a history of schizophrenia.  States he is not on any medicines and has not supposed be on medicines for it.  Does not know who he sees.  Has voices that he hears talking to him.  No suicidal thoughts.  States he will occasionally drink some alcohol socially.  Denies chest pain or trouble breathing. Past Medical History:  Diagnosis Date  . Depression   . Schizophrenia [facility])     Patient Active Problem List   Diagnosis Date Noted  . Schizophrenia, paranoid type (HCC) 02/14/2018  . Psychosis (HCC)   . Cannabis use disorder, moderate, dependence (HCC) 05/10/2016    Past Surgical History:  Procedure Laterality Date  . BACK SURGERY          Home Medications    Prior to Admission medications   Medication Sig Start Date End Date Taking? Authorizing Provider  albuterol (PROVENTIL HFA;VENTOLIN HFA) 108 (90 Base) MCG/ACT inhaler Inhale 2 puffs into the lungs every 4 (four) hours as needed for wheezing or shortness of breath (cough). Patient not taking: Reported on 11/15/2018 02/19/18   Armandina Stammer I, NP  benztropine (COGENTIN) 1 MG tablet Take 1 tablet (1 mg total) by mouth 2 (two) times daily as needed (EPS). Patient not taking: Reported on 11/15/2018 02/19/18   Armandina Stammer I, NP  hydrOXYzine (ATARAX/VISTARIL) 25 MG tablet Take 1 tablet (25 mg total) by mouth every 6 (six) hours. Patient not taking: Reported on 11/15/2018 07/05/18   Aviva Kluver B, PA-C  loratadine (CLARITIN) 10 MG tablet Take 1 tablet (10 mg total) by mouth daily. For allergies Patient not taking: Reported on 11/15/2018  02/20/18   Armandina Stammer I, NP  montelukast (SINGULAIR) 10 MG tablet Take 1 tablet (10 mg total) by mouth at bedtime. For Asthma Patient not taking: Reported on 11/15/2018 02/19/18   Armandina Stammer I, NP  paliperidone (INVEGA SUSTENNA) 156 MG/ML SUSY injection Inject 1 mL (156 mg total) into the muscle every 30 (thirty) days. (Due on 03-17-18): For mood stabilization Patient not taking: Reported on 11/15/2018 03/17/18   Armandina Stammer I, NP  paliperidone (INVEGA) 6 MG 24 hr tablet Take 1 tablet (6 mg total) by mouth at bedtime. For mood control Patient not taking: Reported on 11/15/2018 02/19/18   Armandina Stammer I, NP  traZODone (DESYREL) 100 MG tablet Take 1 tablet (100 mg total) by mouth at bedtime as needed for sleep. Patient not taking: Reported on 11/15/2018 02/19/18   Armandina Stammer I, NP    Family History No family history on file.  Social History Social History   Tobacco Use  . Smoking status: Current Some Day Smoker  . Smokeless tobacco: Never Used  Substance Use Topics  . Alcohol use: No  . Drug use: Yes    Types: Marijuana    Comment: rare     Allergies   Shellfish allergy   Review of Systems Review of Systems  Unable to perform ROS: Psychiatric disorder     Physical Exam Updated Vital Signs BP 117/67 (BP Location: Left Arm)   Pulse Marland Kitchen)  52   Temp 98.3 F (36.8 C) (Oral)   Resp 14   SpO2 95%   Physical Exam HENT:     Head: Atraumatic.     Mouth/Throat:     Mouth: Mucous membranes are moist.  Eyes:     Extraocular Movements: Extraocular movements intact.  Neck:     Musculoskeletal: Neck supple.  Cardiovascular:     Rate and Rhythm: Normal rate and regular rhythm.  Pulmonary:     Breath sounds: No wheezing or rhonchi.  Abdominal:     Tenderness: There is no abdominal tenderness.  Musculoskeletal:     Right lower leg: No edema.     Left lower leg: No edema.  Skin:    General: Skin is warm.     Capillary Refill: Capillary refill takes less than 2 seconds.    Neurological:     Mental Status: He is alert.     Comments: Awake and pleasant.  Moving all extremities.  Difficult to get history from.  Psychiatric:     Comments: Patient is somewhat difficult to get history from.      ED Treatments / Results  Labs (all labs ordered are listed, but only abnormal results are displayed) Labs Reviewed  COMPREHENSIVE METABOLIC PANEL - Abnormal; Notable for the following components:      Result Value   Glucose, Bld 108 (*)    Alkaline Phosphatase 33 (*)    All other components within normal limits  RAPID URINE DRUG SCREEN, HOSP PERFORMED - Abnormal; Notable for the following components:   Tetrahydrocannabinol POSITIVE (*)    All other components within normal limits  ETHANOL  CBC    EKG None  Radiology No results found.  Procedures Procedures (including critical care time)  Medications Ordered in ED Medications  paliperidone (INVEGA) 24 hr tablet 6 mg (6 mg Oral Given 11/15/18 1851)  hydrOXYzine (ATARAX/VISTARIL) tablet 25 mg (has no administration in time range)  traZODone (DESYREL) tablet 100 mg (100 mg Oral Given 11/15/18 2107)  benztropine (COGENTIN) tablet 1 mg (1 mg Oral Given 11/15/18 2107)  paliperidone (INVEGA SUSTENNA) injection 156 mg (has no administration in time range)     Initial Impression / Assessment and Plan / ED Course  I have reviewed the triage vital signs and the nursing notes.  Pertinent labs & imaging results that were available during my care of the patient were reviewed by me and considered in my medical decision making (see chart for details).     Schizophrenia.  States he feels off.  Lab work reassuring.  To be seen by TTS.   Final Clinical Impressions(s) / ED Diagnoses   Final diagnoses:  Schizophrenia, unspecified type [facility])    ED Discharge Orders    None       Benjiman Core, MD 11/15/18 2319

## 2018-11-15 NOTE — ED Triage Notes (Signed)
Patient here via EMS from home with complaints of anxiety related to auditory hallucinations. Reports that voices are "just talking to him". Denies SI, HI.  Homeless. "I just need a shot to make the voices stop".

## 2018-11-15 NOTE — ED Notes (Signed)
Pt sitting up and eating dinner.

## 2018-11-15 NOTE — ED Notes (Signed)
Pt alert and oriented. Pt denies any pain or discomfort. Pt denies any si and hi. Pt c/o of auditory hallucinations. Pt denies any visual hallucinations. Pt states " the voices aren't telling me to do anything, they're just there. Pt safe, will continue to monitor.

## 2018-11-15 NOTE — [facility] Counselor (Signed)
Pt gave verbal and written consent to speak to his aunt [name], [phone]) to obtain collateral information. Pt consented for clinician to provide information to his aunt. Clinician introduced herself to the pt's aunt. Pt's aunt wanted to know why the pt was at the hospital. Pt's aunt expressed when the pt acts like this he has used marijuana. Pt's aunt reported, the pt is linked to [facility] for medication management. Pt's aunt reported, the pt does not take his medication (Benztropine Mesylate 1 mg and his Invega shot once every four weeks) as prescribed. Pt's aunt was concerned that the pt had a panic attack today because he did not take his medication. Pt's aunt denies any concerns for his safety. Pt also stays with his aunt however he reported, he was homeless and staying with a friend. Clinician spoke to the pt and expressed the nature of the call with his aunt. Clinician expressed to the pt that per his aunt he is linked to medication management from [facility] however he reported, he has not been to [place] in a while. Clinician observed the pt smile. Pt's aunt wanted the pt to return her call. Pt declined to call his aunt. Clinician expressed that he can call her if he changes his mind, as she is concerned.   Redmond Pulling, MS, [facility], [facility] Triage Specialist [phone]

## 2018-11-15 NOTE — ED Notes (Signed)
Pt oriented to room and unit.  Pt states he feels as if he is in a fog.  He is hearing voices.  He denies S/I and H/I.  Blood drawn and pt encouraged to provide urine.

## 2018-11-15 NOTE — BH Assessment (Addendum)
Assessment Note  Adrian GivensMarlo Warner is an 28 y.o. male, who presents voluntary and [facility]accompan[facility]. Clinician asked the pt, "what brought you to the hospital?" Pt reported, "I need a shot because I'm spaced out so I can come back to reality." Pt reported, he is all over the place and can't focus. Pt reported, hearing voices talking to him (chatter,) and seeing people. Pt was unable to give specifics of what he was hearing. Pt reported, his life and his mothers' heath are his main stressors. Pt denies, SI, HI, AVH, self-injurious behaviors and access to weapons.   Pt denies abuse. Pt reported, smoking a cigarettes once in a while. Pt's UDS is pending. Pt denies, being linked to OPT resources (medication management and/or counseling.) Pt reported, [facility].   Pt presents quiet/awake in scrubs with logical, coherent speech. Pt's eye contact was good. Pt's preoccupied. Pt's affect was flat. Pt's thought process was partial. Pt's judgement was impaired. Pt's concentration was normal. Pt's insight was poor. Pt's impulse control was fair. Pt reported, he does not feel safe outside [facility] because of life and he is scared of people. Pt did not expound when asked. Pt reported, if inpatient treatment is recommended he would sign-in voluntarily.   Diagnosis: Schizophrenia.   Past Medical History:  Past Medical History:  Diagnosis Date  . Depression   . Schizophrenia [facility](HCC)     Past Surgical History:  Procedure Laterality Date  . BACK SURGERY      Family History: No family history on file.  Social History:  reports that he has been smoking. He has never used smokeless tobacco. He reports current drug use. Drug: Marijuana. He reports that he does not drink alcohol.  Additional Social History:  Alcohol / Drug Use Pain Medications: See MAR Prescriptions: See MAR Over the Counter: See MAR History of alcohol / drug use?: Yes Substance #1 Name of Substance 1: Cigarettes.   1 - Age of First Use: UTA 1 - Amount (size/oz): Pt reported, smoking a cigarette once in a while.  1 - Frequency: Once in a while.  1 - Duration: Varies.  1 - Last Use / Amount: UTA  CIWA: CIWA-Ar BP: 133/76 Pulse Rate: 64 COWS:    Allergies:  Allergies  Allergen Reactions  . Shellfish Allergy Hives and Itching    Home Medications: (Not in a hospital admission)   OB/GYN Status:  No LMP for male patient.  General Assessment Data Location of Assessment: [facility] TTS Assessment: In system Is this a Tele or Face-to-Face Assessment?: Face-to-Face Is this an Initial Assessment or a Re-assessment for this encounter?: Initial Assessment Patient Accompanied by:: N/A Language Other than English: No Living Arrangements: Other (Comment)(Friend. ) What gender do you identify as?: Male Marital status: Single Living Arrangements: Other relatives(Aunt. ) Can pt return to current living arrangement?: Yes Admission Status: Voluntary Is patient capable of signing voluntary admission?: Yes Referral Source: Self/Family/Friend Insurance type: Medicaid.      Crisis Care Plan Living Arrangements: Other relatives(Aunt. ) Legal Guardian: Other:(Self. ) Name of Psychiatrist: NA Name of Therapist: NA  Education Status Is patient currently in school?: No Is the patient employed, unemployed or receiving disability?: Unemployed  Risk to self with the past 6 months Suicidal Ideation: No(Pt denies. ) Has patient been a risk to self within the past 6 months prior to admission? : No Suicidal Intent: No Has patient had any suicidal intent within the past 6 months prior to  admission? : No Is patient at risk for suicide?: No Suicidal Plan?: No(Pt denies. ) Has patient had any suicidal plan within the past 6 months prior to admission? : No Access to Means: No What has been your use of drugs/alcohol within the last 12 months?: Cigarettes.  Previous Attempts/Gestures: No How many times?: 0 Other  Self Harm Risks: NA Triggers for Past Attempts: None known Intentional Self Injurious Behavior: None(Pt denies. ) Family Suicide History: No Recent stressful life event(s): Other (Comment)("my life," mothers' health. ) Persecutory voices/beliefs?: No Depression: Yes Depression Symptoms: Feeling worthless/self pity Substance abuse history and/or treatment for substance abuse?: No(Pt denies. ) Suicide prevention information given to non-admitted patients: Not applicable  Risk to Others within the past 6 months Homicidal Ideation: No(Pt denies. ) Does patient have any lifetime risk of violence toward others beyond the six months prior to admission? : No(Pt denies. ) Thoughts of Harm to Others: No(Pt denies. ) Current Homicidal Intent: No Current Homicidal Plan: No(Pt denies. ) Access to Homicidal Means: No Identified Victim: NA History of harm to others?: No(Pt denies. ) Assessment of Violence: None Noted Violent Behavior Description: NA Does patient have access to weapons?: No Criminal Charges Pending?: No Does patient have a court date: No Is patient on probation?: No  Psychosis Hallucinations: Auditory, Visual Delusions: None noted  Mental Status Report Appearance/Hygiene: In scrubs Eye Contact: Good Motor Activity: Unremarkable Speech: Logical/coherent Level of Consciousness: Quiet/awake Mood: Preoccupied Affect: Flat Anxiety Level: None Thought Processes: Coherent, Relevant Judgement: Impaired Orientation: Person, Place, Time, Situation Obsessive Compulsive Thoughts/Behaviors: None  Cognitive Functioning Concentration: Normal Memory: Recent Intact Is patient IDD: No Insight: Poor Impulse Control: Fair Appetite: Fair Have you had any weight changes? : No Change Sleep: Decreased Total Hours of Sleep: (Pt reported, "not sleeping." ) Vegetative Symptoms: Staying in bed, Not bathing, Decreased grooming  ADLScreening [facility] Assessment Services) Patient's cognitive  ability adequate to safely complete daily activities?: Yes Patient able to express need for assistance with ADLs?: Yes Independently performs ADLs?: Yes (appropriate for developmental age)  Prior Inpatient Therapy Prior Inpatient Therapy: Yes Prior Therapy Dates: Pt unsure.  Prior Therapy Facilty/Provider(s): [facility]. Reason for Treatment: Schizophrenia.   Prior Outpatient Therapy Prior Outpatient Therapy: Yes Prior Therapy Dates: [facility]. Prior Therapy Facilty/Provider(s): Pt reported, a while ago.  Reason for Treatment: Medication management.  Does patient have an ACCT team?: No Does patient have Intensive In-House Services?  : No Does patient have [facility] services? : No(Not currently. ) Does patient have P4CC services?: No  ADL Screening (condition at time of admission) Patient's cognitive ability adequate to safely complete daily activities?: Yes Is the patient deaf or have difficulty hearing?: No Does the patient have difficulty seeing, even when wearing glasses/contacts?: No Does the patient have difficulty concentrating, remembering, or making decisions?: Yes Patient able to express need for assistance with ADLs?: Yes Does the patient have difficulty dressing or bathing?: No Independently performs ADLs?: Yes (appropriate for developmental age) Does the patient have difficulty walking or climbing stairs?: Yes(Pt reported, feeling unsteady on his feet. Pt reported, feeling like he is going to fall. ) Weakness of Legs: None(Pt reported, upper back pain. ) Weakness of Arms/Hands: None  Home Assistive Devices/Equipment Home Assistive Devices/Equipment: None    Abuse/Neglect Assessment (Assessment to be complete while patient is alone) Abuse/Neglect Assessment Can Be Completed: Yes Physical Abuse: Denies(Pt denies. ) Verbal Abuse: Denies(Pt denies. ) Sexual Abuse: Denies(Pt denies. ) Exploitation of patient/patient's resources: Denies(Pt denies. ) Self-Neglect: Denies(Pt  denies. )     Advance Directives (For Healthcare) Does Patient Have a Medical Advance Directive?: No          Disposition: Elta Guadeloupe, FNP recommends overnight observation for safety, stabilization and re-evaluation. Disposition discussed with Dr. Rubin Payor and Melina Schools, RN.    Disposition Initial Assessment Completed for this Encounter: Yes  On Site Evaluation by: Redmond Pulling, MS, [facility], [facility]. Reviewed with Physician: Elta Guadeloupe, FNP.  Redmond Pulling 11/15/2018 8:07 PM   Redmond Pulling, MS, [facility], [facility] Triage Specialist [phone]

## 2018-11-16 DIAGNOSIS — F200 Paranoid schizophrenia: Secondary | ICD-10-CM

## 2018-11-16 NOTE — ED Notes (Signed)
Allowed Invega injection to be given. Calm and cooperative. Would like to be discharged. Reports non-disturbing hallucinations. Reports marijuana makes it better.

## 2018-11-16 NOTE — ED Notes (Signed)
Spoke with pt's guardian and aunt, Clide Cliff, on the telephone and she said that she does not have transportation for pt at this time. She said that it is ok to send him home on a bus or cab. Pt said that he often goes home on the bus or cab.

## 2018-11-16 NOTE — Progress Notes (Signed)
Patient's guardian was called, Joslyn Hy.  She denies any safety concerns at this time and agreeable for the patient to return home.    Nanine Means, PMHNP

## 2018-11-16 NOTE — ED Notes (Signed)
This writer walked pt to the exit and waited with him until the taxi arrived. He was given a taxi voucher by [employer] with SW. Pt's guardian Verl Blalock , with whom this writer spoke earlier, was aware that pt is being discharged and satisfied with [place] providing transportation home. Pt was calm and cooperative. Denies SI/HI/AVH.

## 2018-11-16 NOTE — Consult Note (Addendum)
[facility]  11/16/2018 12:39 PM Talitha GivensMarlo Alter  MRN:  [id number] Principal Problem: Schizophrenia, paranoid type [facility](HCC) Discharge Diagnoses: Principal Problem:   Schizophrenia, paranoid type [facility](HCC)  Subjective: 28 yo male who presented to the ED with an increase in auditory hallucinations and requesting an injection.  Total Time spent with patient: 45 minutes  Past Psychiatric History: Patient presents with an increase in auditory hallucinations requesting an injection. Stressors increased his hallucinations and medications improve them.  Auditory hallucinations present but her reports they are tolerable and always there, sees people at times.  He wants to leave as his injection was given earlier.  No suicidal or homicidal ideations, cannabis use at times which improve his hallucinations.  Calm, cooperative, pleasant on assessment.  His guardian, Comer LocketLavern York, was contacted and she was agreeable for him to return home, no concerns at this time.  She could not come and get him but agreeable for him to take the bus or cab like he normally does.  Past Medical History:  Past Medical History:  Diagnosis Date  . Depression   . Schizophrenia [facility](HCC)     Past Surgical History:  Procedure Laterality Date  . BACK SURGERY     Family History: No family history on file. Family Psychiatric  History: none  Social History:  Social History   Substance and Sexual Activity  Alcohol Use No     Social History   Substance and Sexual Activity  Drug Use Yes  . Types: Marijuana   Comment: rare    Social History   Socioeconomic History  . Marital status: Single    Spouse name: Not on file  . Number of children: Not on file  . Years of education: Not on file  . Highest education level: Not on file  Occupational History  . Not on file  Social Needs  . Financial resource strain: Not on file  . Food insecurity:    Worry: Not on file    Inability: Not on file  . Transportation needs:     Medical: Not on file    Non-medical: Not on file  Tobacco Use  . Smoking status: Current Some Day Smoker  . Smokeless tobacco: Never Used  Substance and Sexual Activity  . Alcohol use: No  . Drug use: Yes    Types: Marijuana    Comment: rare  . Sexual activity: Yes    Birth control/protection: Condom  Lifestyle  . Physical activity:    Days per week: Not on file    Minutes per session: Not on file  . Stress: Not on file  Relationships  . Social connections:    Talks on phone: Not on file    Gets together: Not on file    Attends religious service: Not on file    Active member of club or organization: Not on file    Attends meetings of clubs or organizations: Not on file    Relationship status: Not on file  Other Topics Concern  . Not on file  Social History Narrative  . Not on file    Has this patient used any form of tobacco in the last 30 days? (Cigarettes, Smokeless Tobacco, Cigars, and/or Pipes) A prescription for an FDA-approved tobacco cessation medication was offered at discharge and the patient refused  Current Medications: Current Facility-Administered Medications  Medication Dose Route Frequency Provider Last Rate Last Dose  . benztropine (COGENTIN) tablet 1 mg  1 mg Oral BID Laveda AbbeParks, Laurie Britton, NP  1 mg at 11/16/18 1030  . hydrOXYzine (ATARAX/VISTARIL) tablet 25 mg  25 mg Oral TID PRN Laveda Abbe, NP      . paliperidone (INVEGA) 24 hr tablet 6 mg  6 mg Oral Daily Laveda Abbe, NP   6 mg at 11/16/18 1030  . traZODone (DESYREL) tablet 100 mg  100 mg Oral QHS Laveda Abbe, NP   100 mg at 11/15/18 2107   Current Outpatient Medications  Medication Sig Dispense Refill  . albuterol (PROVENTIL HFA;VENTOLIN HFA) 108 (90 Base) MCG/ACT inhaler Inhale 2 puffs into the lungs every 4 (four) hours as needed for wheezing or shortness of breath (cough). (Patient not taking: Reported on 11/15/2018)    . benztropine (COGENTIN) 1 MG tablet Take 1 tablet  (1 mg total) by mouth 2 (two) times daily as needed (EPS). (Patient not taking: Reported on 11/15/2018) 60 tablet 0  . hydrOXYzine (ATARAX/VISTARIL) 25 MG tablet Take 1 tablet (25 mg total) by mouth every 6 (six) hours. (Patient not taking: Reported on 11/15/2018) 12 tablet 0  . loratadine (CLARITIN) 10 MG tablet Take 1 tablet (10 mg total) by mouth daily. For allergies (Patient not taking: Reported on 11/15/2018)    . montelukast (SINGULAIR) 10 MG tablet Take 1 tablet (10 mg total) by mouth at bedtime. For Asthma (Patient not taking: Reported on 11/15/2018)    . paliperidone (INVEGA SUSTENNA) 156 MG/ML SUSY injection Inject 1 mL (156 mg total) into the muscle every 30 (thirty) days. (Due on 03-17-18): For mood stabilization (Patient not taking: Reported on 11/15/2018) 1.2 mL 0  . paliperidone (INVEGA) 6 MG 24 hr tablet Take 1 tablet (6 mg total) by mouth at bedtime. For mood control (Patient not taking: Reported on 11/15/2018) 30 tablet 0  . traZODone (DESYREL) 100 MG tablet Take 1 tablet (100 mg total) by mouth at bedtime as needed for sleep. (Patient not taking: Reported on 11/15/2018) 30 tablet 0   PTA Medications: (Not in a hospital admission)   Musculoskeletal: Strength & Muscle Tone: within normal limits Gait & Station: normal Patient leans: N/A  Psychiatric Specialty Exam: Physical Exam  Nursing note and vitals reviewed. Constitutional: He is oriented to person, place, and time. He appears well-developed and well-nourished.  HENT:  Head: Normocephalic.  Neck: Normal range of motion.  Cardiovascular: Normal rate.  Respiratory: Effort normal.  Musculoskeletal: Normal range of motion.  Neurological: He is alert and oriented to person, place, and time.  Psychiatric: His speech is normal. Judgment and thought content normal. His mood appears anxious. His affect is blunt. He is actively hallucinating. Cognition and memory are normal.    Review of Systems  Psychiatric/Behavioral: Positive for  hallucinations.  All other systems reviewed and are negative.   Blood pressure 110/75, pulse (!) 52, temperature 98 F (36.7 C), temperature source Oral, resp. rate 16, SpO2 99 %.There is no height or weight on file to calculate BMI.  General Appearance: Casual  Eye Contact:  Good  Speech:  Normal Rate  Volume:  Normal  Mood:  Anxious, mild  Affect:  Flat  Thought Process:  Coherent and Descriptions of Associations: Intact  Orientation:  Full (Time, Place, and Person)  Thought Content:  Hallucinations: Auditory Visual  Suicidal Thoughts:  No  Homicidal Thoughts:  No  Memory:  Immediate;   Good Recent;   Good Remote;   Good  Judgement:  Fair  Insight:  Fair  Psychomotor Activity:  Normal  Concentration:  Concentration: Fair and Attention Span:  Fair  Recall:  Dudley MajorGood  Fund of Knowledge:  Fair  Language:  Good  Akathisia:  No  Handed:  Right  AIMS (if indicated):     Assets:  Housing Leisure Time Physical Health Resilience Social Support  ADL's:  Intact  Cognition:  WNL  Sleep:       Demographic Factors:  Male and Adolescent or young adult  Loss Factors: NA  Historical Factors: NA  Risk Reduction Factors:   Sense of responsibility to family, Living with another person, especially a relative and Positive social support  Continued Clinical Symptoms:  Chronic hallucinations  Cognitive Features That Contribute To Risk:  None    Suicide Risk:  Minimal: No identifiable suicidal ideation.  Patients presenting with no risk factors but with morbid ruminations; may be classified as minimal risk based on the severity of the depressive symptoms   Plan Of Care/Follow-up recommendations:  Schizophrenia, paranoid type: -Continued Invega 6 mg daily  EPS: -Continued Cogentin 1 mg BID   Anxiety: -Continued hydroxyzine 25 mg TID PRN  Insomnia: -Continued Trazodone 100 mg at bedtime  Activity:  as tolerated Diet:  heart healthy diet  Disposition: Discharge  home Nanine MeansLORD, JAMISON, NP 11/16/2018, 12:39 PM  Patient seen face-to-face for psychiatric evaluation, chart reviewed and case discussed with the physician extender and developed treatment plan. Reviewed the information documented and agree with the treatment plan. Thedore MinsMojeed Donatello Kleve, MD

## 2020-01-04 ENCOUNTER — Ambulatory Visit (HOSPITAL_COMMUNITY)
Admission: RE | Admit: 2020-01-04 | Discharge: 2020-01-04 | Disposition: A | Discharge status: Home / Self Care | Payer: Medicaid Other | Attending: Psychiatry | Admitting: Psychiatry

## 2020-01-04 ENCOUNTER — Ambulatory Visit (HOSPITAL_COMMUNITY)
Admission: RE | Admit: 2020-01-04 | Disposition: A | Discharge status: Home / Self Care | Payer: Self-pay | Source: Home / Self Care | Attending: Psychiatry | Admitting: Psychiatry

## 2020-01-04 DIAGNOSIS — F209 Schizophrenia, unspecified: Secondary | ICD-10-CM | POA: Diagnosis not present

## 2020-01-04 DIAGNOSIS — Z538 Procedure and treatment not carried out for other reasons: Secondary | ICD-10-CM | POA: Insufficient documentation

## 2020-01-05 ENCOUNTER — Inpatient Hospital Stay (HOSPITAL_COMMUNITY)
Admission: AD | Admit: 2020-01-05 | Discharge: 2020-01-08 | DRG: 885 | Disposition: A | Discharge status: Home / Self Care | Payer: Medicaid Other | Attending: Psychiatry | Admitting: Psychiatry

## 2020-01-05 ENCOUNTER — Other Ambulatory Visit: Payer: Self-pay

## 2020-01-05 ENCOUNTER — Encounter (HOSPITAL_COMMUNITY): Payer: Self-pay | Admitting: Psychiatry

## 2020-01-05 DIAGNOSIS — F29 Unspecified psychosis not due to a substance or known physiological condition: Secondary | ICD-10-CM | POA: Diagnosis present

## 2020-01-05 DIAGNOSIS — F200 Paranoid schizophrenia: Secondary | ICD-10-CM | POA: Diagnosis present

## 2020-01-05 DIAGNOSIS — Z91013 Allergy to seafood: Secondary | ICD-10-CM | POA: Diagnosis not present

## 2020-01-05 DIAGNOSIS — F1220 Cannabis dependence, uncomplicated: Secondary | ICD-10-CM | POA: Diagnosis present

## 2020-01-05 DIAGNOSIS — F2089 Other schizophrenia: Secondary | ICD-10-CM | POA: Diagnosis present

## 2020-01-05 DIAGNOSIS — F329 Major depressive disorder, single episode, unspecified: Secondary | ICD-10-CM | POA: Diagnosis present

## 2020-01-05 DIAGNOSIS — G4700 Insomnia, unspecified: Secondary | ICD-10-CM | POA: Diagnosis present

## 2020-01-05 DIAGNOSIS — F209 Schizophrenia, unspecified: Secondary | ICD-10-CM | POA: Diagnosis present

## 2020-01-05 DIAGNOSIS — F1410 Cocaine abuse, uncomplicated: Secondary | ICD-10-CM | POA: Diagnosis present

## 2020-01-05 DIAGNOSIS — Z20822 Contact with and (suspected) exposure to covid-19: Secondary | ICD-10-CM | POA: Diagnosis present

## 2020-01-05 LAB — RESPIRATORY PANEL BY RT PCR (FLU A&B, COVID)
Influenza A by PCR: NEGATIVE
Influenza B by PCR: NEGATIVE
SARS Coronavirus 2 by RT PCR: NEGATIVE

## 2020-01-05 MED ORDER — MAGNESIUM HYDROXIDE 400 MG/5ML PO SUSP: 30.0000 mL | Freq: Every day | ORAL | Status: DC | PRN

## 2020-01-05 MED ORDER — ALUM & MAG HYDROXIDE-SIMETH 200-200-20 MG/5ML PO SUSP: 30.0000 mL | ORAL | Status: DC | PRN

## 2020-01-05 MED ORDER — ACETAMINOPHEN 325 MG PO TABS: 650.0000 mg | ORAL_TABLET | Freq: Four times a day (QID) | ORAL | Status: DC | PRN

## 2020-01-05 MED ORDER — BENZTROPINE MESYLATE 1 MG PO TABS
1.0000 mg | ORAL_TABLET | Freq: Two times a day (BID) | ORAL | Status: DC
  Administered 2020-01-05 – 2020-01-08 (×6): 1 mg via ORAL
  Filled 2020-01-05 (×2): qty 1
  Filled 2020-01-05: qty 14
  Filled 2020-01-05 (×5): qty 1
  Filled 2020-01-05: qty 14
  Filled 2020-01-05 (×4): qty 1

## 2020-01-05 MED ORDER — TRAZODONE HCL 100 MG PO TABS
100.0000 mg | ORAL_TABLET | Freq: Every evening | ORAL | Status: DC | PRN
  Administered 2020-01-07: 100 mg via ORAL
  Filled 2020-01-05: qty 1
  Filled 2020-01-05: qty 7

## 2020-01-05 MED ORDER — RISPERIDONE 1 MG PO TBDP
3.0000 mg | ORAL_TABLET | Freq: Two times a day (BID) | ORAL | Status: DC
  Administered 2020-01-05 – 2020-01-07 (×4): 3 mg via ORAL
  Filled 2020-01-05 (×7): qty 3

## 2020-01-05 MED ORDER — HALOPERIDOL 5 MG PO TABS
5.0000 mg | ORAL_TABLET | Freq: Four times a day (QID) | ORAL | Status: DC | PRN
  Administered 2020-01-05: 5 mg via ORAL
  Filled 2020-01-05: qty 1

## 2020-01-05 MED ORDER — HALOPERIDOL LACTATE 5 MG/ML IJ SOLN: 10.0000 mg | Freq: Four times a day (QID) | INTRAMUSCULAR | Status: DC | PRN

## 2020-01-05 MED ORDER — TEMAZEPAM 30 MG PO CAPS
30.0000 mg | ORAL_CAPSULE | Freq: Every day | ORAL | Status: DC
  Administered 2020-01-05 – 2020-01-07 (×3): 30 mg via ORAL
  Filled 2020-01-05 (×3): qty 1

## 2020-01-05 NOTE — [facility] Group Notes (Signed)
LCSW Group Therapy Notes 01/05/2020 3:01 PM  Type of Therapy and Topic: Group Therapy: Overcoming Obstacles  Participation Level: Did Not Attend  Description of Group:  In this group patients will be encouraged to explore what they see as obstacles to their own wellness and recovery. They will be guided to discuss their thoughts, feelings, and behaviors related to these obstacles. The group will process together ways to cope with barriers, with attention given to specific choices patients can make. Each patient will be challenged to identify changes they are motivated to make in order to overcome their obstacles. This group will be process-oriented, with patients participating in exploration of their own experiences as well as giving and receiving support and challenge from other group members.  Therapeutic Goals: 1. Patient will identify personal and current obstacles as they relate to admission. 2. Patient will identify barriers that currently interfere with their wellness or overcoming obstacles.  3. Patient will identify feelings, thought process and behaviors related to these barriers. 4. Patient will identify two changes they are willing to make to overcome these obstacles:   Summary of Patient Progress Sleeping, did not attend.    Therapeutic Modalities:  Cognitive Behavioral Therapy Solution Focused Therapy Motivational Interviewing Relapse Prevention Therapy  Taiya Nutting, MSW, LCSWA 01/05/2020 3:01 PM   

## 2020-01-05 NOTE — Progress Notes (Signed)
Pt alert but not oriented, being admitted for observation per NP. He is voluntary and a poor historian for all information. Denied any SI/HI. He had a conflicting answer when asked about AVH; indicated that he might be but did not want to elaborate. He did not answer clearly to most clearly, fairly tangential. Pt did not list anyone to talk to or give permission for staff to speak with. Pt states he is nervous around women and has trouble talking to them. Pt was paranoid when he came in because there was a Emergency planning/management officer present and he thought he was going get into trouble. He did admit to marijuana use and that he sometimes smokes to "calm down" and drinks sometimes. Pt per history goes to [facility] for meds but kept stating that he does not need to go there and doesn't need meds. Thought process is disorganized and impaired, answering differently to each person. Pt safety is maintained with Q15 min checks. Will continue to monitor.

## 2020-01-05 NOTE — Progress Notes (Signed)
Pt alert and oriented to self.  Presents with moderate confusion, anxious, paranoid, disorganized, actively responding to AVH and is agitated. Observed talking to a guy in his room, telling unseen person about leaving and going home. Believes others are after him "they are coming here, not you but they are looking for me and it's not a lie". Required multiple prompts to cooperative with assessment. Pt transferred to [address] hall. Report given to receiving nurse Joy, RN. Q 15 minutes safety checks maintained without self harm gestures or outburst to note thus far.

## 2020-01-05 NOTE — H&P (Signed)
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, he is 2, he is homeless, drug screen once again reflects dependence on or abuse of cannabis, and he has a history of cocaine abuse as well  Was brought to the emergency department by law enforcement as he was outside the police station behaving bizarrely and endorsing paranoid thoughts.  He was last admitted for full inpatient stabilization in 02/2018, at that point diagnosed with schizophrenia complicated by the substance abuse issues of course and discharged on long-acting injectable paliperidone as well as oral paliperidone 6 mg a day after a 4-day hospital stay.  At the present time he is a poor historian he keeps telling me "I do not know" and giggles through the exam he will not answer numerous questions but states he wants to "kill people were after him" but does not have anyone specific in mind.  When asked if he has hallucinations states "I do not know" and he is generally restless it is clear he needs inpatient stabilization and he is a chronically untreated or undertreated schizophrenic.  He understands though remains contract for safety and can do so when I ask him although again he is somewhat giddy during the exam so his history is unreliable  According to the assessment team note 3/28 [name] is an 29 y.o. single male who presents unaccompanied to [facility] after being  transported voluntarily by Event organiser. Law enforcement reports Pt was outside the police station behaving oddly and stating that people were following him. Pt agreed to come to [place] for assessment.  Pt is a very poor historian, stating "I don't know" to most questions. He isn't oriented to place, time or situation. He says that people have been bothering him and following him. He says this makes him angry and want to fight people. He indicates he doesn't have a place to live. He would not answer if he was experiencing suicidal ideation, homicidal ideation or experiencing hallucinations. He would not answer if asked if he had used alcohol or any other substances. He would not identify anyone to contact for collateral information.  Pt's medical record indicates Pt has a history of schizophrenia. He has a history of disorganized thought process, auditory hallucinations and paranoia. According to medical record, these symptoms are exacerbated when Pt uses drugs. Pt has had multiple presentations to [facility] for symptoms of paranoia and anxiety in the context of substance use of cannabis and cocaine. Pt's aunt, [name] [phone] is listed as a support but Pt was not living with her. When asked for permission to contact his aunt, Pt did not respond. Pt receives medication management through [facility]. He was last psychiatrically hospitalized at [facility] in May 2019.  Pt is casually dressed, alert and only oriented to person. Pt speaks in a clear tone, at moderate volume and normal pace. Motor behavior appears normal. Eye contact is fair. Pt's mood is anxious and affect is congruent with mood. Thought process is disorganized and tangential. Pt's insight and judgment  are impaired.   Associated Signs/Symptoms: Depression Symptoms:  insomnia, psychomotor agitation, (Hypo) Manic Symptoms:  Delusions, Distractibility, Anxiety Symptoms:  n/a Psychotic Symptoms:  Delusions, PTSD  Symptoms: NA Total Time spent with patient: 45 minutes  Past Psychiatric History: Chronic cocaine abuse and cannabis dependency versus abuse  Is the patient at risk to self? Yes.    Has the patient been a risk to self in the past 6 months? No.  Has the patient been a risk to self within the distant past? Yes.    Is the patient a risk to others? No.  Has the patient been a risk to others in the past 6 months? No.  Has the patient been a risk to others within the distant past? No.   Prior Inpatient Therapy:   Prior Outpatient Therapy:    Alcohol Screening: 1. How often do you have a drink containing alcohol?: Monthly or less 2. How many drinks containing alcohol do you have on a typical day when you are drinking?: 1 or 2 3. How often do you have six or more drinks on one occasion?: Never AUDIT-C Score: 1 4. How often during the last year have you found that you were not able to stop drinking once you had started?: Never 5. How often during the last year have you failed to do what was normally expected from you becasue of drinking?: Never 6. How often during the last year have you needed a first drink in the morning to get yourself going after a heavy drinking session?: Never 7. How often during the last year have you had a feeling of guilt of remorse after drinking?: Never 8. How often during the last year have you been unable to remember what happened the night before because you had been drinking?: Never 9. Have you or someone else been injured as a result of your drinking?: No 10. Has a relative or friend or a doctor or another health worker been concerned about your drinking or suggested you cut down?: No Alcohol Use Disorder Identification Test Final Score (AUDIT): 1 Substance Abuse History in the last 12 months:  Yes.   Consequences of Substance Abuse: NA Previous Psychotropic Medications: Yes  Psychological Evaluations: No  Past Medical History:  Past Medical History:   Diagnosis Date  . Depression   . Schizophrenia [facility])     Past Surgical History:  Procedure Laterality Date  . BACK SURGERY     Family History: History reviewed. No pertinent family history. Family Psychiatric  History: see eval Tobacco Screening:   Social History:  Social History   Substance and Sexual Activity  Alcohol Use No     Social History   Substance and Sexual Activity  Drug Use Yes  . Types: Marijuana   Comment: rare    Additional Social History:                           Allergies:   Allergies  Allergen Reactions  . Shellfish Allergy Hives and Itching   Lab Results:  Results for orders placed or performed during the hospital encounter of 01/05/20 (from the past 48 hour(s))  Respiratory Panel by RT PCR (Flu A&B, Covid) - Nasopharyngeal Swab     Status: None   Collection Time: 01/05/20 12:17 AM   Specimen: Nasopharyngeal Swab  Result Value Ref Range   SARS Coronavirus 2 by RT PCR NEGATIVE NEGATIVE    Comment: (NOTE) SARS-CoV-2 target nucleic acids are  NOT DETECTED. The SARS-CoV-2 RNA is generally detectable in upper respiratoy specimens during the acute phase of infection. The lowest concentration of SARS-CoV-2 viral copies this assay can detect is 131 copies/mL. A negative result does not preclude SARS-Cov-2 infection and should not be used as the sole basis for treatment or other patient management decisions. A negative result may occur with  improper specimen collection/handling, submission of specimen other than nasopharyngeal swab, presence of viral mutation(s) within the areas targeted by this assay, and inadequate number of viral copies (<131 copies/mL). A negative result must be combined with clinical observations, patient history, and epidemiological information. The expected result is Negative. Fact Sheet for Patients:  https://www.moore.com/ Fact Sheet for Healthcare Providers:   https://www.young.biz/ This test is not yet ap proved or cleared by the [place] FDA and  has been authorized for detection and/or diagnosis of SARS-CoV-2 by FDA under an Emergency Use Authorization (EUA). This EUA will remain  in effect (meaning this test can be used) for the duration of the COVID-19 declaration under Section 564(b)(1) of the Act, 21 U.S.C. section 360bbb-3(b)(1), unless the authorization is terminated or revoked sooner.    Influenza A by PCR NEGATIVE NEGATIVE   Influenza B by PCR NEGATIVE NEGATIVE    Comment: (NOTE) The Xpert Xpress SARS-CoV-2/FLU/RSV assay is intended as an aid in  the diagnosis of influenza from Nasopharyngeal swab specimens and  should not be used as a sole basis for treatment. Nasal washings and  aspirates are unacceptable for Xpert Xpress SARS-CoV-2/FLU/RSV  testing. Fact Sheet for Patients: https://www.moore.com/ Fact Sheet for Healthcare Providers: https://www.young.biz/ This test is not yet approved or cleared by the [place] FDA and  has been authorized for detection and/or diagnosis of SARS-CoV-2 by  FDA under an Emergency Use Authorization (EUA). This EUA will remain  in effect (meaning this test can be used) for the duration of the  Covid-19 declaration under Section 564(b)(1) of the Act, 21  U.S.C. section 360bbb-3(b)(1), unless the authorization is  terminated or revoked. Performed at [facility], [address] [place], [place] [address]     Blood Alcohol level:  Lab Results  Component Value Date   [facility] <10 11/15/2018   ETH <10 07/05/2018    Metabolic Disorder Labs:  Lab Results  Component Value Date   HGBA1C 5.2 05/12/2016   Lab Results  Component Value Date   PROLACTIN 79.4 (H) 02/15/2018   PROLACTIN 30.5 (H) 05/12/2016   Lab Results  Component Value Date   CHOL 176 05/12/2016   TRIG 312 (H) 05/12/2016   HDL 55 05/12/2016    CHOLHDL 3.2 05/12/2016   VLDL 62 (H) 05/12/2016   LDLCALC 59 05/12/2016    Current Medications: Current Facility-Administered Medications  Medication Dose Route Frequency Provider Last Rate Last Admin  . acetaminophen (TYLENOL) tablet 650 mg  650 mg Oral Q6H PRN Gillermo Murdoch, NP      . alum & mag hydroxide-simeth (MAALOX/MYLANTA) 200-200-20 MG/5ML suspension 30 mL  30 mL Oral Q4H PRN Gillermo Murdoch, NP      . benztropine (COGENTIN) tablet 1 mg  1 mg Oral BID Malvin Johns, MD      . haloperidol (HALDOL) tablet 5 mg  5 mg Oral Q6H PRN Malvin Johns, MD       Or  . haloperidol lactate (HALDOL) injection 10 mg  10 mg Intramuscular Q6H PRN Malvin Johns, MD      . magnesium hydroxide (MILK OF MAGNESIA) suspension 30 mL  30 mL Oral Daily PRN Gillermo Murdoch, NP      . risperiDONE (RISPERDAL M-TABS) disintegrating tablet 3 mg  3 mg Oral BID Malvin Johns, MD      . temazepam (RESTORIL) capsule 30 mg  30 mg Oral QHS Malvin Johns, MD      . traZODone (DESYREL) tablet 100 mg  100 mg Oral QHS PRN Gillermo Murdoch, NP       PTA Medications: No medications prior to admission.    Musculoskeletal: Strength & Muscle Tone: within normal limits Gait & Station: normal Patient leans: N/A  Psychiatric Specialty Exam: Physical Exam  Review of Systems  Blood pressure (!) 156/82, pulse 79, temperature 98.1 F (36.7 C), temperature source Oral, resp. rate 20, height 5\' 5"  (1.651 m), weight 61.2 kg.Body mass index is 22.47 kg/m.  General Appearance: Casual  Eye Contact:  sporadic  Speech:  Pressured  Volume:  Normal  Mood:  Variable sometimes daily sometimes oppositional  Affect:  Non-Congruent  Thought Process:  Irrelevant  Orientation:  Full (Time, Place, and Person)  Thought Content:  Illogical and Delusions  Suicidal Thoughts:  No  Homicidal Thoughts:  Yes.  without intent/plan  Memory:  Immediate;   Poor  Judgement:  Impaired  Insight:  Lacking and Shallow   Psychomotor Activity:  Normal  Concentration:  Concentration: Poor and Attention Span: Poor  Recall:  Poor  Fund of Knowledge:  Poor  Language:  Good  Akathisia:  Negative  Handed:  Right  AIMS (if indicated):     Assets:  Communication Skills Physical Health Resilience  ADL's:  Intact  Cognition:  WNL  Sleep:       Treatment Plan Summary: Daily contact with patient to assess and evaluate symptoms and progress in treatment and Medication management  Observation Level/Precautions:  15 minute checks  Laboratory:  UDS  Psychotherapy: Rehab reality based  Medications: Begin antipsychotics  Consultations:    Discharge Concerns: Longer-term compliance stability and housing  Estimated LOS: 7-10  Other:     Physician Treatment Plan for Primary Diagnosis:   Axis I-chronic paranoid schizophrenia-chronic resistance to treatment will need long-acting injectable again Cocaine abuse in the past negative on the screen chronic cannabis abuse dependency Axis II deferred Axis III medically stable  Long Term Goal(s): Improvement in symptoms so as ready for discharge  Short Term Goals: Ability to verbalize feelings will improve, Ability to disclose and discuss suicidal ideas and Ability to demonstrate self-control will improve  Physician Treatment Plan for Secondary Diagnosis: Active Problems:   Cannabis use disorder, moderate, dependence (HCC)   Psychosis (HCC)   Schizophrenia, paranoid type (HCC)   Psychotic disorder (HCC)   Paranoid schizophrenia (HCC)  Long Term Goal(s): Improvement in symptoms so as ready for discharge  Short Term Goals: Ability to verbalize feelings will improve, Ability to disclose and discuss suicidal ideas and Ability to demonstrate self-control will improve  I certify that inpatient services furnished can reasonably be expected to improve the patient's condition.    , MD 3/29/202111:48 AM

## 2020-01-05 NOTE — BH Assessment (Signed)
Assessment Note  [name] is an 29 y.o. single male who presents unaccompanied to [facility] after being transported voluntarily by Event organiser. Law enforcement reports Pt was outside the police station behaving oddly and stating that people were following him. Pt agreed to come to [place] for assessment.  Pt is a very poor historian, stating "I don't know" to most questions. He isn't oriented to place, time or situation. He says that people have been bothering him and following him. He says this makes him angry and want to fight people. He indicates he doesn't have a place to live. He would not answer if he was experiencing suicidal ideation, homicidal ideation or experiencing hallucinations. He would not answer if asked if he had used alcohol or any other substances. He would not identify anyone to contact for collateral information.  Pt's medical record indicates Pt has a history of schizophrenia. He has a history of disorganized thought process, auditory hallucinations and paranoia. According to medical record, these symptoms are exacerbated when Pt uses drugs. Pt has had multiple presentations to [facility] for symptoms of paranoia and anxiety in the context of substance use of cannabis and cocaine. Pt's aunt, [name] [phone] is listed as a support but Pt was not living with her. When asked for permission to contact his aunt, Pt did not respond. Pt receives medication management through [facility]. He was last psychiatrically hospitalized at [facility] in May 2019.  Pt is casually dressed, alert and only oriented to person. Pt speaks in a clear tone, at moderate volume and normal pace. Motor behavior appears normal. Eye contact is fair. Pt's mood is anxious and affect is congruent with mood. Thought process is disorganized and tangential. Pt's insight and judgment are impaired.   Diagnosis: F20.9 Schizophrenia  Past Medical History:  Past Medical History:  Diagnosis Date  . Depression    . Schizophrenia [facility])     Past Surgical History:  Procedure Laterality Date  . BACK SURGERY      Family History: No family history on file.  Social History:  reports that he has been smoking. He has never used smokeless tobacco. He reports current drug use. Drug: Marijuana. He reports that he does not drink alcohol.  Additional Social History:  Alcohol / Drug Use Pain Medications: Unknown Prescriptions: Unknown Over the Counter: Unknown History of alcohol / drug use?: Yes Longest period of sobriety (when/how long): Unknown Substance #1 Name of Substance 1: Marijuana 1 - Age of First Use: unknown 1 - Amount (size/oz): unknown 1 - Frequency: unknown 1 - Duration: unknown 1 - Last Use / Amount: unknown  CIWA:   COWS:    Allergies:  Allergies  Allergen Reactions  . Shellfish Allergy Hives and Itching    Home Medications: (Not in a hospital admission)   OB/GYN Status:  No LMP for male patient.  General Assessment Data Location of Assessment: [facility] TTS Assessment: In system Is this a Tele or Face-to-Face Assessment?: Face-to-Face Is this an Initial Assessment or a Re-assessment for this encounter?: Initial Assessment Patient Accompanied by:: N/A Language Other than English: No Living Arrangements: Homeless/Shelter What gender do you identify as?: Male Marital status: Single Pregnancy Status: No Living Arrangements: Alone Can pt return to current living arrangement?: Yes Admission Status: Voluntary Is patient capable of signing voluntary admission?: Yes Referral Source: Other(Law enforcement) Insurance type: Medicaid  Medical Screening Exam ([place]) Medical Exam completed: Leotis Pain, NP)  Crisis Care Plan Living Arrangements:  Alone Legal Guardian: Other:(Self) Name of Psychiatrist: Vesta Mixer Name of Therapist: None  Education Status Is patient currently in school?: No Is the patient employed, unemployed or  receiving disability?: Unemployed  Risk to self with the past 6 months Suicidal Ideation: (Unable to assess due to Pt's mental status) Has patient been a risk to self within the past 6 months prior to admission? : (Unable to assess due to Pt's mental status) Suicidal Intent: (Unable to assess due to Pt's mental status) Has patient had any suicidal intent within the past 6 months prior to admission? : (Unable to assess due to Pt's mental status) Is patient at risk for suicide?: (Unable to assess due to Pt's mental status) Suicidal Plan?: (Unable to assess due to Pt's mental status) Has patient had any suicidal plan within the past 6 months prior to admission? : (Unable to assess due to Pt's mental status) Access to Means: (Unable to assess due to Pt's mental status) What has been your use of drugs/alcohol within the last 12 months?: Pt has history of marijuana use Previous Attempts/Gestures: (Unable to assess due to Pt's mental status) How many times?: (Unable to assess due to Pt's mental status) Other Self Harm Risks: Pt appears disoriented Triggers for Past Attempts: (Unable to assess due to Pt's mental status) Intentional Self Injurious Behavior: (Unable to assess due to Pt's mental status) Family Suicide History: Unable to assess Recent stressful life event(s): Other (Comment)(Unable to assess due to Pt's mental status) Persecutory voices/beliefs?: Yes Depression: Yes Depression Symptoms: Feeling angry/irritable, Loss of interest in usual pleasures Substance abuse history and/or treatment for substance abuse?: Yes Suicide prevention information given to non-admitted patients: Not applicable  Risk to Others within the past 6 months Homicidal Ideation: No Does patient have any lifetime risk of violence toward others beyond the six months prior to admission? : Unknown Thoughts of Harm to Others: Yes-Currently Present Comment - Thoughts of Harm to Others: Pt reports thoughts of fighting  people Current Homicidal Intent: No Current Homicidal Plan: No Access to Homicidal Means: (Unable to assess due to Pt's mental status) Identified Victim: None History of harm to others?: (Unable to assess due to Pt's mental status) Assessment of Violence: (Unable to assess due to Pt's mental status) Violent Behavior Description: (Unable to assess due to Pt's mental status) Does patient have access to weapons?: (Unable to assess due to Pt's mental status) Criminal Charges Pending?: (Unable to assess due to Pt's mental status) Does patient have a court date: (Unable to assess due to Pt's mental status) Is patient on probation?: Unknown  Psychosis Hallucinations: (Unable to assess due to Pt's mental status) Delusions: Persecutory  Mental Status Report Appearance/Hygiene: Other (Comment)(Casually dressed) Eye Contact: Fair Motor Activity: Freedom of movement Speech: Tangential Level of Consciousness: Alert Mood: Anxious Affect: Anxious Anxiety Level: Moderate Thought Processes: Flight of Ideas, Tangential Judgement: Impaired Orientation: Person Obsessive Compulsive Thoughts/Behaviors: None  Cognitive Functioning Concentration: Decreased Memory: Unable to Assess Is patient IDD: No Insight: Poor Impulse Control: Fair Appetite: Good Have you had any weight changes? : No Change Sleep: Unable to Assess Total Hours of Sleep: (Unable to assess due to Pt's mental status) Vegetative Symptoms: Unable to Assess  ADLScreening [facility]) Patient's cognitive ability adequate to safely complete daily activities?: Yes Patient able to express need for assistance with ADLs?: Yes Independently performs ADLs?: Yes (appropriate for developmental age)  Prior Inpatient Therapy Prior Inpatient Therapy: Yes Prior Therapy Dates: 02/2018, multiple admits Prior Therapy Facilty/Provider(s): [facility] Reason  for Treatment: Schizophrenia  Prior Outpatient Therapy Prior Outpatient  Therapy: Yes Prior Therapy Dates: Current Prior Therapy Facilty/Provider(s): [facility] Reason for Treatment: schizophrenia Does patient have an ACCT team?: No Does patient have Intensive In-House Services?  : No Does patient have [facility] services? : Yes Does patient have P4CC services?: No  ADL Screening (condition at time of admission) Patient's cognitive ability adequate to safely complete daily activities?: Yes Is the patient deaf or have difficulty hearing?: No Does the patient have difficulty seeing, even when wearing glasses/contacts?: No Does the patient have difficulty concentrating, remembering, or making decisions?: Yes Patient able to express need for assistance with ADLs?: Yes Does the patient have difficulty dressing or bathing?: No Independently performs ADLs?: Yes (appropriate for developmental age) Does the patient have difficulty walking or climbing stairs?: No Weakness of Legs: None Weakness of Arms/Hands: None  Home Assistive Devices/Equipment Home Assistive Devices/Equipment: None    Abuse/Neglect Assessment (Assessment to be complete while patient is alone) Abuse/Neglect Assessment Can Be Completed: Unable to assess, patient is non-responsive or altered mental status     Advance Directives (For Healthcare) Does Patient Have a Medical Advance Directive?: Unable to assess, patient is non-responsive or altered mental status          Disposition: Consulted with Gillermo Murdoch, NP who completed MSE and recommended Pt be observed overnight and evaluated by psychiatry in the morning.  Disposition Initial Assessment Completed for this Encounter: Yes Disposition of Patient: (Observation unit) Patient refused recommended treatment: No  On Site Evaluation by:  Gillermo Murdoch, NP Reviewed with Physician:     Pamalee Leyden, [facility], [facility] Triage Specialist [phone]  Patsy Baltimore, Harlin Rain 01/05/2020 12:18 AM

## 2020-01-05 NOTE — Progress Notes (Signed)
Received pt on 500 adult unit from Obs unit. Pt's nurse and BHT brought pt over, pt is paranoid and agitated was given PRN PO Haldol for this complaint, took it and refused to discuss details of reason for feeling agitation, reports he feels paranoid and that people are out to get in at his home but feels safe here, states, "I don't want to talk to you. I'm going to my room to sleep." Will continue to monitor pt per Q15 minute face checks and monitor for safety.

## 2020-01-05 NOTE — H&P (Signed)
BH Observation Unit Provider Admission PAA/H&P  Patient Identification: [name] MRN:  [id number] Date of Evaluation:  01/05/2020 Chief Complaint:  Psychotic disorder (HCC) [F29] Principal Diagnosis: <principal problem not specified> Diagnosis:  Active Problems:   Cannabis use disorder, moderate, dependence (HCC)   Psychosis (HCC)   Schizophrenia, paranoid type (HCC)   Psychotic disorder (HCC)  History of Present Illness: [name] is a 29 y.o. single male who presents to [facility] unaccompanied and voluntarily. The patient was brought in by law enforcement which, at that time, they disclosed that the patient was found sitting on the police station's steps, and he was behaving strangely. The Officers stated that the patient told them that people were following him.  The patient is a poor historian, stating "I don't know" to almost all questions. The patient is oriented to place, time or situation. He says that people have been bothering him and following him. He says this makes him angry and want to fight people. He indicates he doesn't have a place to live. He would not answer if he were experiencing suicidal ideation, homicidal ideation, or experiencing hallucinations. He would not answer if asked if he had used alcohol or any other substances. He would not identify anyone to contact for collateral information. Associated Signs/Symptoms: Depression Symptoms:  anhedonia, impaired memory, anxiety, (Hypo) Manic Symptoms:  Un able to assess Anxiety Symptoms:  Unable to assess. Psychotic Symptoms:  Delusions, PTSD Symptoms: Unable to assess. Total Time spent with patient: 30 minutes  Past Psychiatric History: Schizophrenia,   Is the patient at risk to self? No.  Has the patient been a risk to self in the past 6 months? No.  Has the patient been a risk to self within the distant past? No.  Is the patient a risk to others? No.  Has the patient been a risk to others in the past 6  months? No.  Has the patient been a risk to others within the distant past? No.   Prior Inpatient Therapy:  Yes Prior Outpatient Therapy:  Yes  Alcohol Screening: 1. How often do you have a drink containing alcohol?: Monthly or less 2. How many drinks containing alcohol do you have on a typical day when you are drinking?: 1 or 2 3. How often do you have six or more drinks on one occasion?: Never AUDIT-C Score: 1 4. How often during the last year have you found that you were not able to stop drinking once you had started?: Never 5. How often during the last year have you failed to do what was normally expected from you becasue of drinking?: Never 6. How often during the last year have you needed a first drink in the morning to get yourself going after a heavy drinking session?: Never 7. How often during the last year have you had a feeling of guilt of remorse after drinking?: Never 8. How often during the last year have you been unable to remember what happened the night before because you had been drinking?: Never 9. Have you or someone else been injured as a result of your drinking?: No 10. Has a relative or friend or a doctor or another health worker been concerned about your drinking or suggested you cut down?: No Alcohol Use Disorder Identification Test Final Score (AUDIT): 1 Substance Abuse History in the last 12 months:  No. Consequences of Substance Abuse: Unable to assess Previous Psychotropic Medications: Yes  Psychological Evaluations: Yes  Past Medical History:  Past Medical History:  Diagnosis Date  . Depression   . Schizophrenia [facility])     Past Surgical History:  Procedure Laterality Date  . BACK SURGERY     Family History: History reviewed. No pertinent family history. Family Psychiatric History: Unknown Tobacco Screening:   Social History:  Social History   Substance and Sexual Activity  Alcohol Use No     Social History   Substance and Sexual Activity  Drug  Use Yes  . Types: Marijuana   Comment: rare    Additional Social History:                           Allergies:   Allergies  Allergen Reactions  . Shellfish Allergy Hives and Itching   Lab Results: No results found for this or any previous visit (from the past 48 hour(s)).  Blood Alcohol level:  Lab Results  Component Value Date   ETH <10 11/15/2018   ETH <10 07/05/2018    Metabolic Disorder Labs:  Lab Results  Component Value Date   HGBA1C 5.2 05/12/2016   Lab Results  Component Value Date   PROLACTIN 79.4 (H) 02/15/2018   PROLACTIN 30.5 (H) 05/12/2016   Lab Results  Component Value Date   CHOL 176 05/12/2016   TRIG 312 (H) 05/12/2016   HDL 55 05/12/2016   CHOLHDL 3.2 05/12/2016   VLDL 62 (H) 05/12/2016   LDLCALC 59 05/12/2016    Current Medications: Current Facility-Administered Medications  Medication Dose Route Frequency Provider Last Rate Last Admin  . acetaminophen (TYLENOL) tablet 650 mg  650 mg Oral Q6H PRN Gillermo Murdoch, NP      . alum & mag hydroxide-simeth (MAALOX/MYLANTA) 200-200-20 MG/5ML suspension 30 mL  30 mL Oral Q4H PRN Gillermo Murdoch, NP      . magnesium hydroxide (MILK OF MAGNESIA) suspension 30 mL  30 mL Oral Daily PRN Gillermo Murdoch, NP      . traZODone (DESYREL) tablet 100 mg  100 mg Oral QHS PRN Gillermo Murdoch, NP       PTA Medications: Medications Prior to Admission  Medication Sig Dispense Refill Last Dose  . albuterol (PROVENTIL HFA;VENTOLIN HFA) 108 (90 Base) MCG/ACT inhaler Inhale 2 puffs into the lungs every 4 (four) hours as needed for wheezing or shortness of breath (cough). (Patient not taking: Reported on 11/15/2018)   Unknown at Unknown time  . benztropine (COGENTIN) 1 MG tablet Take 1 tablet (1 mg total) by mouth 2 (two) times daily as needed (EPS). (Patient not taking: Reported on 11/15/2018) 60 tablet 0 Unknown at Unknown time  . hydrOXYzine (ATARAX/VISTARIL) 25 MG tablet Take 1 tablet (25 mg  total) by mouth every 6 (six) hours. (Patient not taking: Reported on 11/15/2018) 12 tablet 0 Unknown at Unknown time  . loratadine (CLARITIN) 10 MG tablet Take 1 tablet (10 mg total) by mouth daily. For allergies (Patient not taking: Reported on 11/15/2018)   Unknown at Unknown time  . montelukast (SINGULAIR) 10 MG tablet Take 1 tablet (10 mg total) by mouth at bedtime. For Asthma (Patient not taking: Reported on 11/15/2018)   Unknown at Unknown time  . paliperidone (INVEGA SUSTENNA) 156 MG/ML SUSY injection Inject 1 mL (156 mg total) into the muscle every 30 (thirty) days. (Due on 03-17-18): For mood stabilization (Patient not taking: Reported on 11/15/2018) 1.2 mL 0 Unknown at Unknown time  . paliperidone (INVEGA) 6 MG 24 hr tablet Take 1 tablet (6 mg total) by mouth at bedtime.  For mood control (Patient not taking: Reported on 11/15/2018) 30 tablet 0 Unknown at Unknown time  . traZODone (DESYREL) 100 MG tablet Take 1 tablet (100 mg total) by mouth at bedtime as needed for sleep. (Patient not taking: Reported on 11/15/2018) 30 tablet 0 Unknown at Unknown time    Musculoskeletal: Strength & Muscle Tone: within normal limits Gait & Station: normal Patient leans: N/A  Psychiatric Specialty Exam: Physical Exam  Constitutional: He appears well-developed.  Musculoskeletal:     Cervical back: Normal range of motion and neck supple.  Neurological: He is alert.    Review of Systems  Psychiatric/Behavioral: Positive for behavioral problems and confusion. The patient is nervous/anxious.   All other systems reviewed and are negative.   There were no vitals taken for this visit.There is no height or weight on file to calculate BMI.  General Appearance: Bizarre  Eye Contact:  None  Speech:  Garbled  Volume:  Decreased  Mood:  Anxious and Euphoric  Affect:  Congruent, Inappropriate and Restricted  Thought Process:  Disorganized and Descriptions of Associations: Tangential  Orientation:  Other:  Unable to  assess  Thought Content:  Illogical, Delusions and Paranoid Ideation  Suicidal Thoughts:  Unable to assess  Homicidal Thoughts:  Unable to assess  Memory:  Immediate;   Poor Recent;   Poor Remote;   Poor  Judgement:  Impaired  Insight:  Lacking  Psychomotor Activity:  Increased  Concentration:  Concentration: Poor and Attention Span: Poor  Recall:  Poor  Fund of Knowledge:  Poor  Language:  Poor  Akathisia:  Negative  Handed:  Right  AIMS (if indicated):     Assets:  Communication Skills Desire for Improvement Social Support  ADL's:  Intact  Cognition:  Impaired,  Moderate  Sleep:         Treatment Plan Summary: Daily contact with patient to assess and evaluate symptoms and progress in treatment, Medication management and Plan The patient will remain under observation overnight and reassess in the A.M. to determine if he meets criteria for inpatient hospitalization or he can be discharge.  Observation Level/Precautions:  15 minute checks Laboratory:  Order placed Psychotherapy:   Medications:   Consultations:   Discharge Concerns:   Estimated LOS: Other:      Caroline Sauger, NP 3/29/20211:52 AM

## 2020-01-05 NOTE — Progress Notes (Signed)
Pt transferred from 200 hall to 401-1, pt ambulatory but responding to internal stimuli and appeared confused. Pt oriented to the unit, support and encouragement offered as needed, will continue to monitor.

## 2020-01-05 NOTE — Progress Notes (Signed)
Adult Psychoeducational Group Note  Date:  01/05/2020 Time:  8:53 PM  Group Topic/Focus:  Wrap-Up Group:   The focus of this group is to help patients review their daily goal of treatment and discuss progress on daily workbooks.  Participation Level:  Did Not Attend  Participation Quality:  Did Not Attend  Affect:  Did Not Attend  Cognitive:  Did Not Attend  Insight: None  Engagement in Group:  Did Not Attend  Modes of Intervention:  Did Not Attend  Additional Comments:  Pt did not attend evening wrap up group.  Felipa Furnace 01/05/2020, 8:53 PM

## 2020-01-05 NOTE — BH Assessment (Signed)
BHH Assessment Progress Note  Per Malvin Johns, MD, this voluntary pt requires psychiatric hospitalization at this time.  Jasmine has assigned pt to [facility] Rm 503-2.  Pt's nurse, Lincoln Maxin, has been notified.  Doylene Canning, [place] Behavioral Health Coordinator [phone]

## 2020-01-05 NOTE — BHH Suicide Risk Assessment (Signed)
[facility] Admission Suicide Risk Assessment   Nursing information obtained from:  Patient Demographic factors:  Male, Unemployed, Low socioeconomic status Current Mental Status:  NA Loss Factors:  Financial problems / change in socioeconomic status Historical Factors:  NA Risk Reduction Factors:  NA  Total Time spent with patient: 45 minutes Principal Problem: <principal problem not specified> Diagnosis:  Active Problems:   Cannabis use disorder, moderate, dependence (HCC)   Psychosis (HCC)   Schizophrenia, paranoid type (HCC)   Psychotic disorder (HCC)   Paranoid schizophrenia (HCC)  Subjective Data: Chronic untreated psychosis  Continued Clinical Symptoms:  Alcohol Use Disorder Identification Test Final Score (AUDIT): 1 The "Alcohol Use Disorders Identification Test", Guidelines for Use in Primary Care, Second Edition.  World Science writer [facility]). Score between 0-7:  no or low risk or alcohol related problems. Score between 8-15:  moderate risk of alcohol related problems. Score between 16-19:  high risk of alcohol related problems. Score 20 or above:  warrants further diagnostic evaluation for alcohol dependence and treatment.   CLINICAL FACTORS:   Schizophrenia:   Less than 72 years old Musculoskeletal: Strength & Muscle Tone: within normal limits Gait & Station: normal Patient leans: N/A  Psychiatric Specialty Exam: Physical Exam  Review of Systems  Blood pressure (!) 156/82, pulse 79, temperature 98.1 F (36.7 C), temperature source Oral, resp. rate 20, height 5\' 5"  (1.651 m), weight 61.2 kg.Body mass index is 22.47 kg/m.  General Appearance: Casual  Eye Contact:  sporadic  Speech:  Pressured  Volume:  Normal  Mood:  Variable sometimes daily sometimes oppositional  Affect:  Non-Congruent  Thought Process:  Irrelevant  Orientation:  Full (Time, Place, and Person)  Thought Content:  Illogical and Delusions  Suicidal Thoughts:  No  Homicidal Thoughts:  Yes.   without intent/plan  Memory:  Immediate;   Poor  Judgement:  Impaired  Insight:  Lacking and Shallow  Psychomotor Activity:  Normal  Concentration:  Concentration: Poor and Attention Span: Poor  Recall:  Poor  Fund of Knowledge:  Poor  Language:  Good  Akathisia:  Negative  Handed:  Right  AIMS (if indicated):     Assets:  Physical Health Resilience  ADL's:  Intact  Cognition:  WNL  Sleep:      COGNITIVE FEATURES THAT CONTRIBUTE TO RISK:  Polarized thinking    SUICIDE RISK:   Minimal: No identifiable suicidal ideation.  Patients presenting with no risk factors but with morbid ruminations; may be classified as minimal risk based on the severity of the depressive symptoms  PLAN OF CARE: Admit for stabilization  I certify that inpatient services furnished can reasonably be expected to improve the patient's condition.   Manufacturing systems engineer, MD 01/05/2020, 11:55 AM

## 2020-01-05 NOTE — Plan of Care (Signed)
BHH Observation Crisis Plan  Reason for Crisis Plan:  Crisis Stabilization   Plan of Care:  Referral for Telepsychiatry/Psychiatric Consult  Family Support:   did not name family   Current Living Environment:   cannot fully assess, states homeless  Insurance:   Hospital Account    Name Acct ID Class Status Primary Coverage   [name], [name] [id number] BEHAVIORAL HEALTH OBSERVATION [place]        Guarantor Account (for Hospital Account [id number])    Name Relation to Pt Service Area Active? Acct Type   [name] Self [facility] Yes [facility]   Address Phone       [address] [place], [place] [address] [phone](H)          Coverage Information (for Hospital Account [id number])    F/O Payor/Plan Precert #   [facility] [place]    Subscriber Subscriber #   [name], [name] [id number]   Address Phone   [address] [place], [place] [address] [phone]      Legal Guardian:     Primary Care Provider:  Patient, No Pcp Per  Current Outpatient Providers:  [facility]  Psychiatrist:     Counselor/Therapist:     Compliant with Medications:  No  Additional Information: UTA fully d/t diagnosis   [name] 3/29/20211:07 AM

## 2020-01-05 NOTE — Plan of Care (Signed)
D: Patient in room only coming out for medications. Denies SI/HI/AVH. A: Medications administered as prescribed. Needs met as able. Support and encouragement provided.  R: Patient remains safe on the unit. Will continue to monitor for safety and stability.   Problem: Education: Goal: Knowledge of [place] [facility] Education information/materials will improve Outcome: Progressing Goal: Emotional status will improve Outcome: Progressing Goal: Mental status will improve Outcome: Progressing Goal: Verbalization of understanding the information provided will improve Outcome: Progressing   Problem: Activity: Goal: Sleeping patterns will improve Outcome: Progressing   Problem: Coping: Goal: Ability to verbalize frustrations and anger appropriately will improve Outcome: Progressing Goal: Ability to demonstrate self-control will improve Outcome: Progressing   Problem: Health Behavior/Discharge Planning: Goal: Identification of resources available to assist in meeting health care needs will improve Outcome: Progressing Goal: Compliance with treatment plan for underlying cause of condition will improve Outcome: Progressing   Problem: Physical Regulation: Goal: Ability to maintain clinical measurements within normal limits will improve Outcome: Progressing   Problem: Safety: Goal: Periods of time without injury will increase Outcome: Progressing   Problem: Nutritional: Goal: Ability to achieve adequate nutritional intake will improve Outcome: Progressing   Problem: Self-Care: Goal: Ability to participate in self-care as condition permits will improve Outcome: Progressing   Problem: Self-Concept: Goal: Will verbalize positive feelings about self Outcome: Progressing

## 2020-01-06 DIAGNOSIS — F200 Paranoid schizophrenia: Principal | ICD-10-CM

## 2020-01-06 NOTE — Progress Notes (Signed)
   01/06/20 0900  Psych Admission Type (Psych Patients Only)  Admission Status Voluntary  Psychosocial Assessment  Patient Complaints None  Eye Contact Brief;Darting  Facial Expression Flat  Affect Blunted  Speech Logical/coherent  Interaction Cautious;Guarded  Motor Activity Slow  Appearance/Hygiene Unremarkable  Behavior Characteristics Cooperative  Mood Pleasant  Thought Process  Coherency Concrete thinking  Content Paranoia;Compulsions  Delusions None reported or observed  Perception WDL  Hallucination None reported or observed  Judgment Limited  Confusion WDL  Danger to Self  Current suicidal ideation? Denies  Danger to Others  Danger to Others None reported or observed

## 2020-01-06 NOTE — Progress Notes (Signed)
[facility] MD Progress Note  01/06/2020 8:01 AM [name]  MRN:  [id number] Subjective:  This is the latest of multiple healthcare system encounters/admissions for [name], he is 51, he is homeless, drug screen once again reflects dependence on or abuse of cannabis, and he has a history of cocaine abuse as well- Was brought to the emergency department by law enforcement as he was outside the police station behaving bizarrely and endorsing paranoid thoughts.  He was last admitted for full inpatient stabilization in 02/2018, at that point diagnosed with schizophrenia complicated by the substance abuse issues of course and discharged on long-acting injectable paliperidone as well as oral paliperidone 6 mg a day after a 4-day hospital stay.  Seen in his room he is oriented to person place general situation and he makes random statements.  He will say "how are you?"  And not respond specifically to my questions but again makes random statements but when more attentive and focused he can tell me he denies auditory or visual hallucinations at the present time or thoughts of self-harm and he has become compliant with medication. He denies hallucinations Principal Problem: Paranoid schizophrenia/substance abuse Diagnosis: Active Problems:   Cannabis use disorder, moderate, dependence (HCC)   Psychosis (HCC)   Schizophrenia, paranoid type (HCC)   Psychotic disorder (HCC)   Paranoid schizophrenia (HCC)   Schizophrenia (HCC)  Total Time spent with patient: 20 minutes  Past Psychiatric History: See eval  Past Medical History:  Past Medical History:  Diagnosis Date  . Depression   . Schizophrenia [facility])     Past Surgical History:  Procedure Laterality Date  . BACK SURGERY     Family History: History reviewed. No pertinent family history. Family Psychiatric  History: Denies Social History:  Social History   Substance and Sexual Activity  Alcohol Use No     Social History   Substance and  Sexual Activity  Drug Use Yes  . Types: Marijuana   Comment: rare    Social History   Socioeconomic History  . Marital status: Single    Spouse name: Not on file  . Number of children: Not on file  . Years of education: Not on file  . Highest education level: Not on file  Occupational History  . Occupation: unemployed  Tobacco Use  . Smoking status: Current Some Day Smoker    Types: Cigarettes  . Smokeless tobacco: Never Used  Substance and Sexual Activity  . Alcohol use: No  . Drug use: Yes    Types: Marijuana    Comment: rare  . Sexual activity: Yes    Birth control/protection: Condom  Other Topics Concern  . Not on file  Social History Narrative   UTA certain topics - tangential thought process, cannot answer clearly. Pt not sure about school; reportedly is homeless.    Social Determinants of Health   Financial Resource Strain:   . Difficulty of Paying Living Expenses:   Food Insecurity:   . Worried About Programme researcher, broadcasting/film/video in the Last Year:   . Barista in the Last Year:   Transportation Needs:   . Freight forwarder (Medical):   Marland Kitchen Lack of Transportation (Non-Medical):   Physical Activity:   . Days of Exercise per Week:   . Minutes of Exercise per Session:   Stress:   . Feeling of Stress :   Social Connections:   . Frequency of Communication with Friends and Family:   . Frequency of Social Gatherings  with Friends and Family:   . Attends Religious Services:   . Active Member of Clubs or Organizations:   . Attends Banker Meetings:   Marland Kitchen Marital Status:    Additional Social History:                         Sleep: Fair  Appetite:  Fair  Current Medications: Current Facility-Administered Medications  Medication Dose Route Frequency Provider Last Rate Last Admin  . acetaminophen (TYLENOL) tablet 650 mg  650 mg Oral Q6H PRN Gillermo Murdoch, NP      . alum & mag hydroxide-simeth (MAALOX/MYLANTA) 200-200-20 MG/5ML  suspension 30 mL  30 mL Oral Q4H PRN Gillermo Murdoch, NP      . benztropine (COGENTIN) tablet 1 mg  1 mg Oral BID Malvin Johns, MD   1 mg at 01/05/20 2052  . haloperidol (HALDOL) tablet 5 mg  5 mg Oral Q6H PRN Malvin Johns, MD   5 mg at 01/05/20 1543   Or  . haloperidol lactate (HALDOL) injection 10 mg  10 mg Intramuscular Q6H PRN Malvin Johns, MD      . magnesium hydroxide (MILK OF MAGNESIA) suspension 30 mL  30 mL Oral Daily PRN Gillermo Murdoch, NP      . risperiDONE (RISPERDAL M-TABS) disintegrating tablet 3 mg  3 mg Oral BID Malvin Johns, MD   3 mg at 01/05/20 1651  . temazepam (RESTORIL) capsule 30 mg  30 mg Oral QHS Malvin Johns, MD   30 mg at 01/05/20 2052  . traZODone (DESYREL) tablet 100 mg  100 mg Oral QHS PRN Gillermo Murdoch, NP        Lab Results:  Results for orders placed or performed during the hospital encounter of 01/05/20 (from the past 48 hour(s))  Respiratory Panel by RT PCR (Flu A&B, Covid) - Nasopharyngeal Swab     Status: None   Collection Time: 01/05/20 12:17 AM   Specimen: Nasopharyngeal Swab  Result Value Ref Range   SARS Coronavirus 2 by RT PCR NEGATIVE NEGATIVE    Comment: (NOTE) SARS-CoV-2 target nucleic acids are NOT DETECTED. The SARS-CoV-2 RNA is generally detectable in upper respiratoy specimens during the acute phase of infection. The lowest concentration of SARS-CoV-2 viral copies this assay can detect is 131 copies/mL. A negative result does not preclude SARS-Cov-2 infection and should not be used as the sole basis for treatment or other patient management decisions. A negative result may occur with  improper specimen collection/handling, submission of specimen other than nasopharyngeal swab, presence of viral mutation(s) within the areas targeted by this assay, and inadequate number of viral copies (<131 copies/mL). A negative result must be combined with clinical observations, patient history, and epidemiological information.  The expected result is Negative. Fact Sheet for Patients:  https://www.moore.com/ Fact Sheet for Healthcare Providers:  https://www.young.biz/ This test is not yet ap proved or cleared by the [place] FDA and  has been authorized for detection and/or diagnosis of SARS-CoV-2 by FDA under an Emergency Use Authorization (EUA). This EUA will remain  in effect (meaning this test can be used) for the duration of the COVID-19 declaration under Section 564(b)(1) of the Act, 21 U.S.C. section 360bbb-3(b)(1), unless the authorization is terminated or revoked sooner.    Influenza A by PCR NEGATIVE NEGATIVE   Influenza B by PCR NEGATIVE NEGATIVE    Comment: (NOTE) The Xpert Xpress SARS-CoV-2/FLU/RSV assay is intended as an aid in  the diagnosis of influenza  from Nasopharyngeal swab specimens and  should not be used as a sole basis for treatment. Nasal washings and  aspirates are unacceptable for Xpert Xpress SARS-CoV-2/FLU/RSV  testing. Fact Sheet for Patients: PinkCheek.be Fact Sheet for Healthcare Providers: GravelBags.it This test is not yet approved or cleared by the [place] FDA and  has been authorized for detection and/or diagnosis of SARS-CoV-2 by  FDA under an Emergency Use Authorization (EUA). This EUA will remain  in effect (meaning this test can be used) for the duration of the  Covid-19 declaration under Section 564(b)(1) of the Act, 21  U.S.C. section 360bbb-3(b)(1), unless the authorization is  terminated or revoked. Performed at [facility], [place] [address] [place], [place] [address]     Blood Alcohol level:  Lab Results  Component Value Date   [facility] <10 11/15/2018   ETH <10 93/26/7124    Metabolic Disorder Labs: Lab Results  Component Value Date   HGBA1C 5.2 05/12/2016   Lab Results  Component Value Date   PROLACTIN 79.4 (H) 02/15/2018    PROLACTIN 30.5 (H) 05/12/2016   Lab Results  Component Value Date   CHOL 176 05/12/2016   TRIG 312 (H) 05/12/2016   HDL 55 05/12/2016   CHOLHDL 3.2 05/12/2016   VLDL 62 (H) 05/12/2016   LDLCALC 59 05/12/2016    Physical Findings: AIMS: Facial and Oral Movements Muscles of Facial Expression: None, normal Lips and Perioral Area: None, normal Jaw: None, normal Tongue: None, normal,Extremity Movements Upper (arms, wrists, hands, fingers): None, normal Lower (legs, knees, ankles, toes): None, normal, Trunk Movements Neck, shoulders, hips: None, normal, Overall Severity Severity of abnormal movements (highest score from questions above): None, normal Incapacitation due to abnormal movements: None, normal Patient's awareness of abnormal movements (rate only patient's report): No Awareness, Dental Status Current problems with teeth and/or dentures?: No Does patient usually wear dentures?: No  CIWA:    COWS:     Musculoskeletal: Strength & Muscle Tone: within normal limits Gait & Station: normal Patient leans: N/A  Psychiatric Specialty Exam: Physical Exam  Review of Systems  Blood pressure 131/82, pulse (!) 108, temperature 97.7 F (36.5 C), temperature source Oral, resp. rate 20, height 5\' 5"  (1.651 m), weight 61.2 kg.Body mass index is 22.47 kg/m.  General Appearance: Casual  Eye Contact:  Minimal  Speech:  Pressured  Volume:  Normal  Mood:  Ranges from euthymic to silly  Affect:  Non-Congruent  Thought Process:  Irrelevant and Descriptions of Associations: Loose  Orientation:  Other:  Person place situation month  Thought Content:  Some inappropriate laughter but denies hallucinations thoughts are generally random speech generally random  Suicidal Thoughts:  No  Homicidal Thoughts:  No  Memory:  Recent;   Poor Remote;   Fair  Judgement:  Poor  Insight:  Shallow  Psychomotor Activity:  Normal  Concentration:  Concentration: Fair and Attention Span: Fair  Recall:   [employer] of Knowledge:  Fair  Language:  Fair  Akathisia:  Negative  Handed:  Right  AIMS (if indicated):     Assets:  Physical Health Resilience  ADL's:  Intact  Cognition:  WNL  Sleep:  Number of Hours: 7.25     Treatment Plan Summary: Daily contact with patient to assess and evaluate symptoms and progress in treatment and Medication management  No change in precautions continue antipsychotic therapy continue to monitor for safety continue reality based therapy thus far patient is indeed compliant  Samyuktha Brau, MD 01/06/2020, 8:01  AM

## 2020-01-06 NOTE — Progress Notes (Signed)
Recreation Therapy Notes  INPATIENT RECREATION THERAPY ASSESSMENT  Patient Details Name: [name] MRN: [id number] DOB: 1991/03/05 Today's Date: 01/06/2020       Information Obtained From: Patient  Able to Participate in Assessment/Interview: Yes  Patient Presentation: Alert  Reason for Admission (Per Patient): Other (Comments)(Pt stated he was scared.)  Patient Stressors: (People getting on his nerves and it ain't right)  Coping Skills:   Isolation, TV, Arguments, Music  Leisure Interests (2+):  ("I don't know right not, it's time to get right".)  Expressed Interest in [employer] Information: No  [place] of Residence:  [place]  Patient Main Form of Transportation: (Pt did not answer.)  Patient Strengths:  "I don't know"  Patient Identified Areas of Improvement:  "I don't know"  Patient Goal for Hospitalization:  "lay my butt down somewhere"  Current SI (including self-harm):  No  Current HI:  No  Current AVH: No  Staff Intervention Plan: Group Attendance, Collaborate with Interdisciplinary Treatment Team  Consent to Intern Participation: N/A    Caroll Rancher, LRT/CTRS  Caroll Rancher A 01/06/2020, 1:53 PM

## 2020-01-06 NOTE — [facility] Suicide Risk Assessment (Signed)
[facility] INPATIENT:  Family/Significant Other Suicide Prevention Education  Suicide Prevention Education:  Patient Refusal for Family/Significant Other Suicide Prevention Education: The patient [name] has refused to provide written consent for family/significant other to be provided Family/Significant Other Suicide Prevention Education during admission and/or prior to discharge.  Physician notified.  Darreld Mclean 01/06/2020, 11:06 AM

## 2020-01-06 NOTE — [facility] Counselor (Signed)
Adult Comprehensive Assessment  Patient ID: [name], male   DOB: 1991/01/23, 29 y.o.   MRN: [id number]  Information Source: Information source: Patient  Current Stressors:  Patient states their primary concerns and needs for treatment are:: "I was scared for." Reports he believes a specific person was out to kill him, but does not feel comfortable sharing information about this person. Patient states their goals for this hospitilization and ongoing recovery are:: "Let things cool down for a minute."   Educational / Learning stressors: N/A Employment / Job issues:Pt receives [employer] Family Relationships: "I have no family." Chart review indicates patient used to reside with his aunt. Financial / Lack of resources (include bankruptcy):Limited income Housing / Lack of housing:"I stay by myself." Did not disclose specifics. Reports he plans to get a hotel outside of [place] "where its safe," after discharge. Physical health (include injuries & life threatening diseases): Denies Social relationships:No social supports. "I keep to my self." Has a hard time trusting others. Substance abuse:Denies all substance use. Bereavement / Loss: N/A  Living/Environment/Situation: Living Arrangements:Lives alone Living conditions (as described by patient or guardian):"Not safe." How long has patient lived in current situation?:Unknown What is atmosphere in current home: Dangerous, temporary  Family History: Marital status: Single Are you sexually active?: No What is your sexual orientation?: Straight  Has your sexual activity been affected by drugs, alcohol, medication, or emotional stress?: N/A Does patient have children?: No  Childhood History: By whom was/is the patient raised?: Grandparents Description of patient's relationship with caregiver when they were a child: "I don't know" Patient's description of current relationship with people who raised him/her: "I  don't know" How were you disciplined when you got in trouble as a child/adolescent?: "Man I guess punishment" Does patient have siblings?: Yes Number of Siblings: 1 Description of patient's current relationship with siblings: Does not talk to brother Did patient suffer any verbal/emotional/physical/sexual abuse as a child?: No Did patient suffer from severe childhood neglect?: No Has patient ever been sexually abused/assaulted/raped as an adolescent or adult?: No Was the patient ever a victim of a crime or a disaster?: No Witnessed domestic violence?: No Has patient been effected by domestic violence as an adult?: No  Education: Highest grade of school patient has completed: 9th grade Currently a student?: No Name of school: n/a  Employment/Work Situation: Employment situation:Pt receives SSI, unknown when payments began Patient's job has been impacted by current illness: Yes 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 What is the longest time patient has a held a job?: "For a few" Where was the patient employed at that time?: [employer] Has patient ever been in the [employer]?: No Has patient ever served in combat?: No Did You Receive Any Psychiatric Treatment/Services While in Equities trader?: No Are There Guns or Other Weapons in Your Home?: No Are These Weapons Safely Secured?: Yes (N/A) Who Could Verify You Are Able To Have These Secured:: N/A  Financial Resources:   Alcohol/Substance Abuse: What has been your use of drugs/alcohol within the last 12 months?:Pt denies all substance use If attempted suicide, did drugs/alcohol play a role in this?: No Alcohol/Substance Abuse Treatment Hx: [facility] twice in 2019  Social Support System: Patient's Community Support System: None Describe Community Support System: No supports Type of faith/religion:N/A How does patient's faith help to cope with current illness?:N/A  Leisure/Recreation: Leisure  and Hobbies:Football, watching tv  Strengths/Needs: What things does the patient do well?:Video games  Discharge Plan: Does patient  have access to transportation?: Yes, public transportation. Will patient be returning to same living situation after discharge?: No reports he plans to get a hotel outside of [place] after discharge, "somewhere that's safe." Currently receiving community mental health services:No. Has received services from [facility] in the past but declines outpatient follow up. Does patient have financial barriers related to discharge medications?: No, has Medicaid.  Summary/Recommendations:   Summary and Recommendations (to be completed by the evaluator): [name] is a 29 year old male living in [place], [place] ([place]. He presents to [facility] after being transported voluntarily by Event organiser. Law enforcement reports patient was outside the police station behaving oddly and stating that people were following him. Pt has a history of schizophrenia. Pt has not been compliant with medications,he was last inpatient at [facility] in 2019. Recommendations for patient include: crisis stabilization, therapeutic milieu, encourage group attendance and participation, medication management for mood stabilization, and development of comprehensive mental wellness plan.  [name]. 01/06/2020

## 2020-01-06 NOTE — Progress Notes (Signed)
Recreation Therapy Notes  Date: 3.30.21 Time: 0950 Location: [address] Dayroom  Group Topic: Wellness  Goal Area(s) Addresses:  Patient will define components of whole wellness. Patient will verbalize benefit of whole wellness.  Intervention: Music  Activity:  Exercise.  LRT led patients in a series of stretches to loosen up the muscles and body.  Each patient took turns in leading the group in an exercise of their choosing.  Patients were encouraged to take breaks and drink water as needed.  Education:Wellness, Discharge Planning.   Education Outcome: Acknowledges education/In group clarification offered/Needs additional education.   Clinical Observations/Feedback:  Pt did not attend group session.   Caroll Rancher, LRT/CTRS        Caroll Rancher A 01/06/2020 11:17 AM

## 2020-01-07 MED ORDER — PALIPERIDONE PALMITATE ER 234 MG/1.5ML IM SUSY
234.0000 mg | PREFILLED_SYRINGE | Freq: Once | INTRAMUSCULAR | Status: AC
  Administered 2020-01-07: 234 mg via INTRAMUSCULAR
  Filled 2020-01-07: qty 1.5

## 2020-01-07 MED ORDER — RISPERIDONE 2 MG PO TBDP
4.0000 mg | ORAL_TABLET | Freq: Two times a day (BID) | ORAL | Status: DC
  Administered 2020-01-07 – 2020-01-08 (×2): 4 mg via ORAL
  Filled 2020-01-07 (×6): qty 2

## 2020-01-07 NOTE — Progress Notes (Signed)
D:  Patient denied SI and HI, contracts for safety.  Denied A/V hallucinations.  Denied pain. A:  Medications administered per MD orders.  Emotional support and encouragement given patient. R:  Safety maintained with 15 minute checks.  

## 2020-01-07 NOTE — Progress Notes (Signed)
Patient ID: [name], male   DOB: 1991-02-12, 29 y.o.   MRN: [id number]    [place] NOVEL CORONAVIRUS (COVID-19) DAILY CHECK-OFF SYMPTOMS - answer yes or no to each - every day NO YES  Have you had a fever in the past 24 hours?  . Fever (Temp > 37.80C / 100F) X   Have you had any of these symptoms in the past 24 hours? . New Cough .  Sore Throat  .  Shortness of Breath .  Difficulty Breathing .  Unexplained Body Aches   X   Have you had any one of these symptoms in the past 24 hours not related to allergies?   . Runny Nose .  Nasal Congestion .  Sneezing   X   If you have had runny nose, nasal congestion, sneezing in the past 24 hours, has it worsened?  X   EXPOSURES - check yes or no X   Have you traveled outside the state in the past 14 days?  X   Have you been in contact with someone with a confirmed diagnosis of COVID-19 or PUI in the past 14 days without wearing appropriate PPE?  X   Have you been living in the same home as a person with confirmed diagnosis of COVID-19 or a PUI (household contact)?    X   Have you been diagnosed with COVID-19?    X              What to do next: Answered NO to all: Answered YES to anything:   Proceed with unit schedule Follow the [facility] Inpatient Flowsheet.

## 2020-01-07 NOTE — Progress Notes (Signed)
   01/06/20 2300  Psych Admission Type (Psych Patients Only)  Admission Status Involuntary  Psychosocial Assessment  Patient Complaints Tension  Eye Contact Brief  Facial Expression Flat  Affect Blunted;Apprehensive  Speech Logical/coherent  Interaction Defensive;Isolative;Minimal  Motor Activity Slow  Appearance/Hygiene Unremarkable  Behavior Characteristics Guarded;Cooperative  Mood Apprehensive;Preoccupied  Aggressive Behavior  Targets Other (Comment) (pt reports thoughts of aggression toward unspecified people)  Type of Behavior Provoked or triggered (pt reports being triggered today by unspecified people here)  Effect No apparent injury (reports isolated instead of engaging)  Thought Process  Coherency Concrete thinking  Content Blaming others;Paranoia  Delusions None reported or observed  Perception WDL  Hallucination None reported or observed  Judgment Limited  Confusion None  Danger to Self  Current suicidal ideation? Denies  Danger to Others  Danger to Others Reported or observed  Danger to Others Abnormal  Harmful Behavior to others Threats of violence towards other people observed or expressed   Description of Harmful Behavior reports wanting to 'hurt' those who triggered him but maintained control and went to room   D: Patient in room only coming out for medications & snacks. Denies SI/AVH. Reports being triggered by people talking to him today and feelings of wanting to 'put them in their place'. Reports maintaining calm and self isolating instead of acting on thoughts.  A: Medications administered as prescribed. Needs met as able. Support and encouragement provided.  R: Patient remains safe on the unit. Will continue to monitor for safety and stability.

## 2020-01-07 NOTE — Tx Team (Signed)
Interdisciplinary Treatment and Diagnostic Plan Update  01/07/2020 Time of Session: 9:00am [name] MRN: [id number]  Principal Diagnosis: <principal problem not specified>  Secondary Diagnoses: Active Problems:   Cannabis use disorder, moderate, dependence (HCC)   Psychosis (HCC)   Schizophrenia, paranoid type ([place])   Psychotic disorder ([place])   Paranoid schizophrenia ([place])   Schizophrenia ([place])   Current Medications:  Current Facility-Administered Medications  Medication Dose Route Frequency Provider Last Rate Last Admin  . acetaminophen (TYLENOL) tablet 650 mg  650 mg Oral Q6H PRN Caroline Sauger, NP      . alum & mag hydroxide-simeth (MAALOX/MYLANTA) 200-200-20 MG/5ML suspension 30 mL  30 mL Oral Q4H PRN Caroline Sauger, NP      . benztropine (COGENTIN) tablet 1 mg  1 mg Oral BID Johnn Hai, MD   1 mg at 01/07/20 0810  . haloperidol (HALDOL) tablet 5 mg  5 mg Oral Q6H PRN Johnn Hai, MD   5 mg at 01/05/20 1543   Or  . haloperidol lactate (HALDOL) injection 10 mg  10 mg Intramuscular Q6H PRN Johnn Hai, MD      . magnesium hydroxide (MILK OF MAGNESIA) suspension 30 mL  30 mL Oral Daily PRN Caroline Sauger, NP      . paliperidone (INVEGA SUSTENNA) injection 234 mg  234 mg Intramuscular Once Johnn Hai, MD      . risperiDONE (RISPERDAL M-TABS) disintegrating tablet 4 mg  4 mg Oral BID Johnn Hai, MD      . temazepam (RESTORIL) capsule 30 mg  30 mg Oral QHS Johnn Hai, MD   30 mg at 01/06/20 2041  . traZODone (DESYREL) tablet 100 mg  100 mg Oral QHS PRN Caroline Sauger, NP       PTA Medications: No medications prior to admission.    Patient Stressors:    Patient Strengths:    Treatment Modalities: Medication Management, Group therapy, Case management,  1 to 1 session with clinician, Psychoeducation, Recreational therapy.   Physician Treatment Plan for Primary Diagnosis: <principal problem not specified> Long Term Goal(s): Improvement in  symptoms so as ready for discharge Improvement in symptoms so as ready for discharge   Short Term Goals: Ability to verbalize feelings will improve Ability to disclose and discuss suicidal ideas Ability to demonstrate self-control will improve Ability to verbalize feelings will improve Ability to disclose and discuss suicidal ideas Ability to demonstrate self-control will improve  Medication Management: Evaluate patient's response, side effects, and tolerance of medication regimen.  Therapeutic Interventions: 1 to 1 sessions, Unit Group sessions and Medication administration.  Evaluation of Outcomes: Not Met  Physician Treatment Plan for Secondary Diagnosis: Active Problems:   Cannabis use disorder, moderate, dependence (HCC)   Psychosis ([place])   Schizophrenia, paranoid type ([place])   Psychotic disorder ([place])   Paranoid schizophrenia ([place])   Schizophrenia ([place])  Long Term Goal(s): Improvement in symptoms so as ready for discharge Improvement in symptoms so as ready for discharge   Short Term Goals: Ability to verbalize feelings will improve Ability to disclose and discuss suicidal ideas Ability to demonstrate self-control will improve Ability to verbalize feelings will improve Ability to disclose and discuss suicidal ideas Ability to demonstrate self-control will improve     Medication Management: Evaluate patient's response, side effects, and tolerance of medication regimen.  Therapeutic Interventions: 1 to 1 sessions, Unit Group sessions and Medication administration.  Evaluation of Outcomes: Not Met   RN Treatment Plan for Primary Diagnosis: <principal problem not specified> Long Term Goal(s): Knowledge  of disease and therapeutic regimen to maintain health will improve  Short Term Goals: Ability to verbalize feelings will improve, Ability to identify and develop effective coping behaviors will improve and Compliance with prescribed medications will improve  Medication  Management: RN will administer medications as ordered by provider, will assess and evaluate patient's response and provide education to patient for prescribed medication. RN will report any adverse and/or side effects to prescribing provider.  Therapeutic Interventions: 1 on 1 counseling sessions, Psychoeducation, Medication administration, Evaluate responses to treatment, Monitor vital signs and CBGs as ordered, Perform/monitor CIWA, COWS, AIMS and Fall Risk screenings as ordered, Perform wound care treatments as ordered.  Evaluation of Outcomes: Not Met   LCSW Treatment Plan for Primary Diagnosis: <principal problem not specified> Long Term Goal(s): Safe transition to appropriate next level of care at discharge, Engage patient in therapeutic group addressing interpersonal concerns.  Short Term Goals: Engage patient in aftercare planning with referrals and resources, Increase social support, Identify triggers associated with mental health/substance abuse issues and Increase skills for wellness and recovery  Therapeutic Interventions: Assess for all discharge needs, 1 to 1 time with Social worker, Explore available resources and support systems, Assess for adequacy in community support network, Educate family and significant other(s) on suicide prevention, Complete Psychosocial Assessment, Interpersonal group therapy.  Evaluation of Outcomes: Not Met  Progress in Treatment: Attending groups: No. Participating in groups: No. Taking medication as prescribed: Yes. Toleration medication: Yes. Family/Significant other contact made: No, will contact:  declined consents. Patient understands diagnosis: No. Discussing patient identified problems/goals with staff: Yes. Medical problems stabilized or resolved: Yes. Denies suicidal/homicidal ideation: Yes. Issues/concerns per patient self-inventory: Yes.  New problem(s) identified: Yes, Describe:  unstable housing.  New Short Term/Long Term  Goal(s): medication management for mood stabilization; elimination of SI thoughts; development of comprehensive mental wellness/sobriety plan.  Patient Goals:  "Get some rest and get discharged. Go somewhere safe."  Discharge Plan or Barriers: Patient reports he plans to leave [facility] and stay in a hotel. Patient declines outpatient follow up at this time. CSW continuing to assess for appropriate referrals.   Reason for Continuation of Hospitalization: Anxiety Delusions  Medication stabilization  Estimated Length of Stay: 3-5 days  Attendees: Patient: [name] 01/07/2020 9:40 AM  Physician: Dr.Farah 01/07/2020 9:40 AM  Nursing:  01/07/2020 9:40 AM  RN Care Manager: 01/07/2020 9:40 AM  Social Worker: [name] Acre, [place] 01/07/2020 9:40 AM  Recreational Therapist:  01/07/2020 9:40 AM  Other:  01/07/2020 9:40 AM  Other:  01/07/2020 9:40 AM  Other: 01/07/2020 9:40 AM    Scribe for Treatment Team: Joellen Jersey, [place] 01/07/2020 9:40 AM

## 2020-01-07 NOTE — Progress Notes (Signed)
Recreation Therapy Notes  Date: 3.31.21 Time: 1000 Location: 500 [address] Dayroom  Group Topic: Communication, Team Building, Problem Solving  Goal Area(s) Addresses:  Patient will effectively work with peer towards shared goal.  Patient will identify skill used to make activity successful.  Patient will identify how skills used during activity can be used to reach post d/c goals.   Intervention: STEM Activity   Activity: In team's, using 10 red plastic cups and a rubber band with strings attached, patients were asked to flip the cups over and stack them into a pyramid.    Education: Social Skills, Discharge Planning.   Education Outcome: Acknowledges education/In group clarification offered/Needs additional education.   Clinical Observations/Feedback: Pt did not attend group.     Daveda Larock, LRT/CTRS         Desera Graffeo A 01/07/2020 11:53 AM 

## 2020-01-07 NOTE — Progress Notes (Signed)
[facility] MD Progress Note  01/07/2020 9:40 AM [name]  MRN:  [id number] Subjective:    Patient continues to endorse some paranoia believes someone is "after him" last night and specifically they were outside the hospital  So again he is still paranoid denies current hallucinations and acknowledges he is homeless but states he will find a place we will administer long-acting injectable today discussed respected side effects  Principal Problem: Psychosis Diagnosis: Active Problems:   Cannabis use disorder, moderate, dependence (HCC)   Psychosis (HCC)   Schizophrenia, paranoid type (HCC)   Psychotic disorder (HCC)   Paranoid schizophrenia (HCC)   Schizophrenia (HCC)  Total Time spent with patient: 20 minutes  Past Psychiatric History: see eval  Past Medical History:  Past Medical History:  Diagnosis Date  . Depression   . Schizophrenia [facility])     Past Surgical History:  Procedure Laterality Date  . BACK SURGERY     Family History: History reviewed. No pertinent family history. Family Psychiatric  History: see eval Social History:  Social History   Substance and Sexual Activity  Alcohol Use No     Social History   Substance and Sexual Activity  Drug Use Yes  . Types: Marijuana   Comment: rare    Social History   Socioeconomic History  . Marital status: Single    Spouse name: Not on file  . Number of children: Not on file  . Years of education: Not on file  . Highest education level: Not on file  Occupational History  . Occupation: unemployed  Tobacco Use  . Smoking status: Current Some Day Smoker    Types: Cigarettes  . Smokeless tobacco: Never Used  Substance and Sexual Activity  . Alcohol use: No  . Drug use: Yes    Types: Marijuana    Comment: rare  . Sexual activity: Yes    Birth control/protection: Condom  Other Topics Concern  . Not on file  Social History Narrative   UTA certain topics - tangential thought process, cannot answer clearly. Pt not  sure about school; reportedly is homeless.    Social Determinants of Health   Financial Resource Strain:   . Difficulty of Paying Living Expenses:   Food Insecurity:   . Worried About Programme researcher, broadcasting/film/video in the Last Year:   . Barista in the Last Year:   Transportation Needs:   . Freight forwarder (Medical):   Marland Kitchen Lack of Transportation (Non-Medical):   Physical Activity:   . Days of Exercise per Week:   . Minutes of Exercise per Session:   Stress:   . Feeling of Stress :   Social Connections:   . Frequency of Communication with Friends and Family:   . Frequency of Social Gatherings with Friends and Family:   . Attends Religious Services:   . Active Member of Clubs or Organizations:   . Attends Banker Meetings:   Marland Kitchen Marital Status:    Additional Social History:                         Sleep: Fair  Appetite:  Fair  Current Medications: Current Facility-Administered Medications  Medication Dose Route Frequency Provider Last Rate Last Admin  . acetaminophen (TYLENOL) tablet 650 mg  650 mg Oral Q6H PRN Gillermo Murdoch, NP      . alum & mag hydroxide-simeth (MAALOX/MYLANTA) 200-200-20 MG/5ML suspension 30 mL  30 mL Oral Q4H PRN Gillermo Murdoch,  NP      . benztropine (COGENTIN) tablet 1 mg  1 mg Oral BID Malvin Johns, MD   1 mg at 01/07/20 0810  . haloperidol (HALDOL) tablet 5 mg  5 mg Oral Q6H PRN Malvin Johns, MD   5 mg at 01/05/20 1543   Or  . haloperidol lactate (HALDOL) injection 10 mg  10 mg Intramuscular Q6H PRN Malvin Johns, MD      . magnesium hydroxide (MILK OF MAGNESIA) suspension 30 mL  30 mL Oral Daily PRN Gillermo Murdoch, NP      . paliperidone (INVEGA SUSTENNA) injection 234 mg  234 mg Intramuscular Once Malvin Johns, MD      . risperiDONE (RISPERDAL M-TABS) disintegrating tablet 4 mg  4 mg Oral BID Malvin Johns, MD      . temazepam (RESTORIL) capsule 30 mg  30 mg Oral QHS Malvin Johns, MD   30 mg at 01/06/20 2041   . traZODone (DESYREL) tablet 100 mg  100 mg Oral QHS PRN Gillermo Murdoch, NP        Lab Results: No results found for this or any previous visit (from the past 48 hour(s)).  Blood Alcohol level:  Lab Results  Component Value Date   ETH <10 11/15/2018   ETH <10 07/05/2018    Metabolic Disorder Labs: Lab Results  Component Value Date   HGBA1C 5.2 05/12/2016   Lab Results  Component Value Date   PROLACTIN 79.4 (H) 02/15/2018   PROLACTIN 30.5 (H) 05/12/2016   Lab Results  Component Value Date   CHOL 176 05/12/2016   TRIG 312 (H) 05/12/2016   HDL 55 05/12/2016   CHOLHDL 3.2 05/12/2016   VLDL 62 (H) 05/12/2016   LDLCALC 59 05/12/2016    Physical Findings: AIMS: Facial and Oral Movements Muscles of Facial Expression: None, normal Lips and Perioral Area: None, normal Jaw: None, normal Tongue: None, normal,Extremity Movements Upper (arms, wrists, hands, fingers): None, normal Lower (legs, knees, ankles, toes): None, normal, Trunk Movements Neck, shoulders, hips: None, normal, Overall Severity Severity of abnormal movements (highest score from questions above): None, normal Incapacitation due to abnormal movements: None, normal Patient's awareness of abnormal movements (rate only patient's report): No Awareness, Dental Status Current problems with teeth and/or dentures?: No Does patient usually wear dentures?: No  CIWA:    COWS:     Musculoskeletal: Strength & Muscle Tone: within normal limits Gait & Station: normal Patient leans: N/A  Psychiatric Specialty Exam: Physical Exam  Review of Systems  Blood pressure 131/86, pulse 66, temperature 97.7 F (36.5 C), temperature source Oral, resp. rate 18, height 5\' 5"  (1.651 m), weight 61.2 kg.Body mass index is 22.47 kg/m.  General Appearance: Casual  Eye Contact:  Good  Speech:  Pressured  Volume:  Decreased  Mood:  Euthymic  Affect:  Congruent  Thought Process:  Goal Directed  Orientation:  Full (Time,  Place, and Person)  Thought Content:  Delusions and Paranoid Ideation  Suicidal Thoughts:  No  Homicidal Thoughts:  No  Memory:  Immediate;   Poor Recent;   Poor Remote;   Fair  Judgement:  Impaired  Insight:  Fair  Psychomotor Activity:  Normal  Concentration:  Concentration: Good and Attention Span: Good  Recall:  Fair  Fund of Knowledge:  Good  Language:  Fair  Akathisia:  Negative  Handed:  Right  AIMS (if indicated):     Assets:  Communication Skills Desire for Improvement  ADL's:  Intact  Cognition:  WNL  Sleep:  Number of Hours: 7.25    Treatment Plan Summary: Daily contact with patient to assess and evaluate symptoms and progress in treatment and Medication management  Administer long-acting injectable ESCALATE oral risperidone No change in precautions Rivkah Wolz, MD 01/07/2020, 9:40 AM

## 2020-01-07 NOTE — B[facility] Group Notes (Signed)
LCSW Aftercare Discharge Planning Group Note  01/07/2020   Type of Group and Topic: Psychoeducational Group: Discharge Planning  Participation Level: Did Not Attend  Description of Group  Discharge planning group reviews patient's anticipated discharge plans and assists patients to anticipate and address any barriers to wellness/recovery in the community. Suicide prevention education is reviewed with patients in group.  Therapeutic Goals  1. Patients will state their anticipated discharge plan and mental health aftercare  2. Patients will identify potential barriers to wellness in the community setting  3. Patients will engage in problem solving, solution focused discussion of ways to anticipate and address barriers to wellness/recovery  Summary of Patient Progress  Plan for Discharge/Comments:  Transportation Means:  Supports:  Therapeutic Modalities:  Motivational Interviewing  Jaylan Hinojosa C [name], LCSWA  01/07/2020 1:46 PM 

## 2020-01-08 MED ORDER — RISPERIDONE 3 MG PO TABS: 6.0000 mg | ORAL_TABLET | Freq: Every day | ORAL | 3 refills | Status: DC

## 2020-01-08 MED ORDER — INVEGA SUSTENNA 234 MG/1.5ML IM SUSY: 234.0000 mg | PREFILLED_SYRINGE | Freq: Once | INTRAMUSCULAR | 11 refills | Status: DC

## 2020-01-08 MED ORDER — BENZTROPINE MESYLATE 1 MG PO TABS: 1.0000 mg | ORAL_TABLET | Freq: Two times a day (BID) | ORAL | 2 refills | Status: DC

## 2020-01-08 MED ORDER — RISPERIDONE 3 MG PO TABS
6.0000 mg | ORAL_TABLET | Freq: Every day | ORAL | Status: DC
  Filled 2020-01-08: qty 14

## 2020-01-08 MED ORDER — TRAZODONE HCL 100 MG PO TABS: 100.0000 mg | ORAL_TABLET | Freq: Every evening | ORAL | 1 refills | Status: DC | PRN

## 2020-01-08 NOTE — [facility] Suicide Risk Assessment (Signed)
[facility] Discharge Suicide Risk Assessment   Principal Problem: <principal problem not specified> Discharge Diagnoses: Active Problems:   Cannabis use disorder, moderate, dependence (HCC)   Psychosis (HCC)   Schizophrenia, paranoid type (HCC)   Psychotic disorder (HCC)   Paranoid schizophrenia (HCC)   Schizophrenia (HCC)   Total Time spent with patient: 45 minutes Musculoskeletal: Strength & Muscle Tone: within normal limits Gait & Station: normal Patient leans: N/A  Psychiatric Specialty Exam: Physical Exam  Review of Systems  Blood pressure 134/86, pulse (!) 101, temperature 97.7 F (36.5 C), temperature source Oral, resp. rate 18, height 5\' 5"  (1.651 m), weight 61.2 kg.Body mass index is 22.47 kg/m.  General Appearance: Casual  Eye Contact:  Minimal  Speech:  Normal Rate  Volume:  Normal  Mood:  Euthymic  Affect:  Appropriate  Thought Process:  Linear  Orientation:  Full (Time, Place, and Person)  Thought Content:  Logical and Paranoid Ideation mild  Suicidal Thoughts:  No  Homicidal Thoughts:  No  Memory:  Immediate;   Fair Recent;   Fair Remote;   Fair  Judgement:  Fair  Insight:  Fair  Psychomotor Activity:  TD  Concentration:  Concentration: Fair and Attention Span: Fair  Recall:  of Knowledge:  Fair  Language:  Fair  Akathisia:  Negative  Handed:  Right  AIMS (if indicated):     Assets:  Resilience Social Support Talents/Skills  ADL's:  Intact  Cognition:  WNL  Sleep:  Number of Hours: 6.75   Mental Status Per Nursing Assessment::   On Admission:  NA  Demographic Factors:  Living alone and Unemployed  Loss Factors: Decrease in vocational status  Historical Factors: Impulsivity  Risk Reduction Factors:   Religious beliefs about death  Continued Clinical Symptoms:  Schizophrenia:   Paranoid or undifferentiated type  Cognitive Features That Contribute To Risk:  Loss of executive function    Suicide Risk:  Minimal: No  identifiable suicidal ideation.  Patients presenting with no risk factors but with morbid ruminations; may be classified as minimal risk based on the severity of the depressive symptoms  Follow-up Information    [facility] [place]. Go to.   Specialty: Professional Counselor Why: Please go to this provider's office during their walk in hours:  Monday through Friday, 8:30  am to 12:00 pm and 1:00 pm to 2:30 pm. Contact information: Wednesday of the [employer] [address] [place] [phone]           Plan Of Care/Follow-up recommendations:  Activity:  full  Shakiah Wester, MD 01/08/2020, 10:54 AM

## 2020-01-08 NOTE — Discharge Summary (Signed)
Physician Discharge Summary Note  Patient:  [name] is an 29 y.o., male MRN:  [id number] DOB:  11-22-1990 Patient phone:  [phone] (home)  Patient address:   [address] [place] [address],  Total Time spent with patient: 45 minutes  Date of Admission:  01/05/2020 Date of Discharge: 01/08/2020  Reason for Admission:    This is the latest of multiple healthcare system encounters/admissions for [name], he is 57, he is homeless, drug screen once again reflects dependence on or abuse of cannabis, and he has a history of cocaine abuse as well  Was brought to the emergency department by law enforcement as he was outside the police station behaving bizarrely and endorsing paranoid thoughts.  He was last admitted for full inpatient stabilization in 02/2018, at that point diagnosed with schizophrenia complicated by the substance abuse issues of course and discharged on long-acting injectable paliperidone as well as oral paliperidone 6 mg a day after a 4-day hospital stay.  At the present time he is a poor historian he keeps telling me "I do not know" and giggles through the exam he will not answer numerous questions but states he wants to "kill people were after him" but does not have anyone specific in mind.  When asked if he has hallucinations states "I do not know" and he is generally restless it is clear he needs inpatient stabilization and he is a chronically untreated or undertreated schizophrenic.  He understands though remains contract for safety and can do so when I ask him although again he is somewhat giddy during the exam so his history is unreliable  According to the assessment team note 3/28 Adrian Torainis an 29 y.o.singlemalewho presents unaccompanied to [facility] after being transported voluntarily by Patent examiner. Law enforcement reports Pt was outside the police station behaving oddly and stating that people were following him.Pt agreed to come to [facility] for  assessment.  Pt is a very poor historian, stating "I don't know" to most questions. He isn't oriented to place, time or situation. He says that people have been bothering him and following him. He says this makes him angry and want to fight people. He indicates he doesn't have a place to live. He would not answer if he was experiencing suicidal ideation, homicidal ideation or experiencing hallucinations. He would not answer if asked if he had used alcohol or any other substances. He would not identify anyone to contact for collateral information.  Pt's medical record indicates Pt has a history of schizophrenia. He has a history of disorganized thought process, auditory hallucinations and paranoia. According to medical record, these symptoms are exacerbated when Pt uses drugs. Pt has had multiple presentations to [facility] for symptoms of paranoia and anxiety in the context of substance use of cannabis and cocaine. Pt's aunt, [name] [phone] is listed as a support but Pt was not living with her. When asked for permission to contact his aunt, Pt did not respond. Pt receives medication management through [facility]. He was last psychiatrically hospitalized at [facility] in May 2019.  Pt iscasually dressed,alert and only oriented to person. Pt speaks in a clear tone, at moderate volume and normal pace. Motor behavior appears normal. Eye contact isfair. Pt's mood isanxiousand affect is congruent with mood. Thought process is disorganized and tangential.Pt's insight and judgment are impaired.  Principal Problem: <principal problem not specified> Discharge Diagnoses: Active Problems:   Cannabis use disorder, moderate, dependence (HCC)   Psychosis (HCC)  Schizophrenia, paranoid type (HCC)   Psychotic disorder (HCC)   Paranoid schizophrenia (HCC)   Schizophrenia (HCC)   Past Psychiatric History: see eval  Past Medical History:  Past Medical History:  Diagnosis Date  . Depression   .  Schizophrenia [facility])     Past Surgical History:  Procedure Laterality Date  . BACK SURGERY     Family History: History reviewed. No pertinent family history. Family Psychiatric  History: see eval Social History:  Social History   Substance and Sexual Activity  Alcohol Use No     Social History   Substance and Sexual Activity  Drug Use Yes  . Types: Marijuana   Comment: rare    Social History   Socioeconomic History  . Marital status: Single    Spouse name: Not on file  . Number of children: Not on file  . Years of education: Not on file  . Highest education level: Not on file  Occupational History  . Occupation: unemployed  Tobacco Use  . Smoking status: Current Some Day Smoker    Types: Cigarettes  . Smokeless tobacco: Never Used  Substance and Sexual Activity  . Alcohol use: No  . Drug use: Yes    Types: Marijuana    Comment: rare  . Sexual activity: Yes    Birth control/protection: Condom  Other Topics Concern  . Not on file  Social History Narrative   UTA certain topics - tangential thought process, cannot answer clearly. Pt not sure about school; reportedly is homeless.    Social Determinants of Health   Financial Resource Strain:   . Difficulty of Paying Living Expenses:   Food Insecurity:   . Worried About Programme researcher, broadcasting/film/video in the Last Year:   . Barista in the Last Year:   Transportation Needs:   . Freight forwarder (Medical):   Marland Kitchen Lack of Transportation (Non-Medical):   Physical Activity:   . Days of Exercise per Week:   . Minutes of Exercise per Session:   Stress:   . Feeling of Stress :   Social Connections:   . Frequency of Communication with Friends and Family:   . Frequency of Social Gatherings with Friends and Family:   . Attends Religious Services:   . Active Member of Clubs or Organizations:   . Attends Banker Meetings:   Marland Kitchen Marital Status:     Hospital Course:    Discussed mother was admitted under  petition for involuntary commitment he readily acknowledged paranoia and being off of medications but he was compliant here and had the insight to know it was due to "schizophrenia"  He was given Risperdal with overall success of reducing his paranoia although he did not have a full resolution he denied auditory visual hallucinations and only mild paranoia by the time of discharge.  He also received long-acting injectable.  He had no thoughts of harming self or others and could contract fully for safety point discharge.  Discharge meds include the oral Risperdal and long-acting injectable paliperidone patient states he had money to stay in a hotel.  Physical Findings: AIMS: Facial and Oral Movements Muscles of Facial Expression: None, normal Lips and Perioral Area: None, normal Jaw: None, normal Tongue: None, normal,Extremity Movements Upper (arms, wrists, hands, fingers): None, normal Lower (legs, knees, ankles, toes): None, normal, Trunk Movements Neck, shoulders, hips: None, normal, Overall Severity Severity of abnormal movements (highest score from questions above): None, normal Incapacitation due to abnormal movements: None,  normal Patient's awareness of abnormal movements (rate only patient's report): No Awareness, Dental Status Current problems with teeth and/or dentures?: No Does patient usually wear dentures?: No  CIWA:    COWS:     Musculoskeletal: Strength & Muscle Tone: within normal limits Gait & Station: normal Patient leans: N/A  Psychiatric Specialty Exam: Physical Exam  Review of Systems  Blood pressure 134/86, pulse (!) 101, temperature 97.7 F (36.5 C), temperature source Oral, resp. rate 18, height 5\' 5"  (1.651 m), weight 61.2 kg.Body mass index is 22.47 kg/m.  General Appearance: Casual  Eye Contact:  Minimal  Speech:  Normal Rate  Volume:  Normal  Mood:  Euthymic  Affect:  Appropriate  Thought Process:  Linear  Orientation:  Full (Time, Place, and Person)   Thought Content:  Logical and Paranoid Ideation mild  Suicidal Thoughts:  No  Homicidal Thoughts:  No  Memory:  Immediate;   Fair Recent;   Fair Remote;   Fair  Judgement:  Fair  Insight:  Fair  Psychomotor Activity:  TD  Concentration:  Concentration: Fair and Attention Span: Fair  Recall:  [place] of Knowledge:  Fair  Language:  Fair  Akathisia:  Negative  Handed:  Right  AIMS (if indicated):     Assets:  Resilience Social Support Talents/Skills  ADL's:  Intact  Cognition:  WNL  Sleep:  Number of Hours: 6.75        Has this patient used any form of tobacco in the last 30 days? (Cigarettes, Smokeless Tobacco, Cigars, and/or Pipes) Yes, No  Blood Alcohol level:  Lab Results  Component Value Date   ETH <10 11/15/2018   ETH <10 57/32/2025    Metabolic Disorder Labs:  Lab Results  Component Value Date   HGBA1C 5.2 05/12/2016   Lab Results  Component Value Date   PROLACTIN 79.4 (H) 02/15/2018   PROLACTIN 30.5 (H) 05/12/2016   Lab Results  Component Value Date   CHOL 176 05/12/2016   TRIG 312 (H) 05/12/2016   HDL 55 05/12/2016   CHOLHDL 3.2 05/12/2016   VLDL 62 (H) 05/12/2016   LDLCALC 59 05/12/2016    See Psychiatric Specialty Exam and Suicide Risk Assessment completed by Attending Physician prior to discharge.  Discharge destination:  Home  Is patient on multiple antipsychotic therapies at discharge:  No   Has Patient had three or more failed trials of antipsychotic monotherapy by history:  No  Recommended Plan for Multiple Antipsychotic Therapies: NA   Allergies as of 01/08/2020      Reactions   Shellfish Allergy Hives, Itching      Medication List    TAKE these medications     Indication  benztropine 1 MG tablet Commonly known as: COGENTIN Take 1 tablet (1 mg total) by mouth 2 (two) times daily.  Indication: Extrapyramidal Reaction caused by Medications   Kirt Boys 234 MG/1.5ML Susy injection Generic drug: paliperidone Inject  234 mg into the muscle once for 1 dose. Due 4/25  Indication: Schizophrenia   risperiDONE 3 MG tablet Commonly known as: RisperDAL Take 2 tablets (6 mg total) by mouth at bedtime.  Indication: Schizophrenia   traZODone 100 MG tablet Commonly known as: DESYREL Take 1 tablet (100 mg total) by mouth at bedtime as needed for sleep.  Indication: [place] to.   Specialty: Professional Counselor Why: Please go to this provider's office during their  walk in hours:  Monday through Friday, 8:30  am to 12:00 pm and 1:00 pm to 2:30 pm. Contact information: [facility] [place] [address] [place] [address] [phone]          Signed: Malvin Johns, MD 01/08/2020, 10:50 AM

## 2020-01-08 NOTE — Progress Notes (Signed)
   01/07/20 1100  Psych Admission Type (Psych Patients Only)  Admission Status Involuntary  Psychosocial Assessment  Patient Complaints None  Eye Contact Fair  Facial Expression Flat  Affect Blunted  Speech Logical/coherent  Interaction Minimal  Motor Activity Other (Comment) (WNL)  Appearance/Hygiene In hospital gown  Behavior Characteristics Cooperative  Mood Preoccupied  Thought Process  Coherency Concrete thinking  Content Blaming others;Paranoia  Delusions None reported or observed  Perception WDL  Hallucination None reported or observed  Judgment Limited  Confusion None  Danger to Self  Current suicidal ideation? Denies  Danger to Others  Danger to Others None reported or observed

## 2020-01-08 NOTE — Progress Notes (Signed)
Pt discharged to lobby. Pt was stable and appreciative at that time. All papers, samples and prescriptions were given and valuables returned. Verbal understanding expressed. Denies SI/HI and A/VH. Pt given opportunity to express concerns and ask questions.  

## 2020-01-08 NOTE — Progress Notes (Signed)
  [facility] Adult Case Management Discharge Plan :  Will you be returning to the same living situation after discharge:  No. Going to stay in a hotel. At discharge, do you have transportation home?: Yes,  has bus passes. Do you have the ability to pay for your medications: Yes,  has Medicaid.  Release of information consent forms completed and in the chart;  Patient's signature needed at discharge.  Patient to Follow up at: Follow-up Information    [facility] [employer]. Go to.   Specialty: Professional Counselor Why: Please go to this provider's office during their walk in hours:  Monday through Friday, 8:30  am to 12:00 pm and 1:00 pm to 2:30 pm. Contact information: [employer] [facility] [place] [address] [place] [address] [phone]           Next level of care provider has access to [facility] Link:no  Safety Planning and Suicide Prevention discussed: Yes,  with patient.  Has patient been referred to the Quitline?: N/A patient is not a smoker  Patient has been referred for addiction treatment: Pt. refused referral  Darreld Mclean, LCSWA 01/08/2020, 11:30 AM

## 2020-01-08 NOTE — Progress Notes (Signed)
Recreation Therapy Notes  Date: 4.1.21 Time: 1000 Location: 500 Hall Dayroom  Group Topic: Self-Esteem  Goal Area(s) Addresses:  Patient will successfully identify positive attributes about themselves.  Patient will successfully identify benefit of improved self-esteem.   Intervention: Blank Armed forces logistics/support/administrative officer, Holiday representative paper, glue sticks, scissors, music  Activity: Personalized Plates.  Patients were to create personalized license plates that highlight unique things about them such as accomplishments, important dates, special talents, etc.  Education:  Self-Esteem, Building control surveyor.   Education Outcome: Acknowledges education/In group clarification offered/Needs additional education  Clinical Observations/Feedback: Pt did not attend group.    Caroll Rancher, LRT/CTRS    Caroll Rancher A 01/08/2020 11:27 AM

## 2020-01-09 ENCOUNTER — Other Ambulatory Visit: Payer: Self-pay

## 2020-01-09 ENCOUNTER — Emergency Department (HOSPITAL_COMMUNITY)
Admission: EM | Admit: 2020-01-09 | Discharge: 2020-01-09 | Disposition: A | Discharge status: Home / Self Care | Payer: Medicaid Other | Attending: Emergency Medicine | Admitting: Emergency Medicine

## 2020-01-09 ENCOUNTER — Encounter (HOSPITAL_COMMUNITY): Payer: Self-pay | Admitting: Emergency Medicine

## 2020-01-09 DIAGNOSIS — F329 Major depressive disorder, single episode, unspecified: Secondary | ICD-10-CM | POA: Diagnosis not present

## 2020-01-09 DIAGNOSIS — F17210 Nicotine dependence, cigarettes, uncomplicated: Secondary | ICD-10-CM | POA: Diagnosis not present

## 2020-01-09 DIAGNOSIS — F209 Schizophrenia, unspecified: Secondary | ICD-10-CM | POA: Insufficient documentation

## 2020-01-09 DIAGNOSIS — Z79899 Other long term (current) drug therapy: Secondary | ICD-10-CM | POA: Insufficient documentation

## 2020-01-09 DIAGNOSIS — Z046 Encounter for general psychiatric examination, requested by authority: Secondary | ICD-10-CM | POA: Diagnosis present

## 2020-01-09 NOTE — BH Assessment (Signed)
Tele Assessment Note   Patient Name: [name] MRN: [id number] Referring Physician: Veatrice Kells, MD Location of Patient: [facility] Location of Provider: [place] is an 29 y.o. male who presents to the ED voluntarily. Pt was d/c from [facility] on 01/08/[facility] and returned to the ED on 01/09/2020. Pt states he does not know why he came to the ED. He states he "just needs to be away from people who agitate me." TTS asked the pt to describe who he needs to be away from and the pt states "just everyone in general." Pt denies any specific stressor. Pt denies SI, HI, and AVH. Pt states he is homeless and has no support or resources. Pt has a hx of inpt admissions. Pt denies a current [place] provider, however per chart review pt has been followed by [facility] in the past for OPT MH needs. Pt was d/c from [facility] and given resources to follow up with for [facility].   Pt is vague during the TTS assessment. Pt is cooperative and responds to direct questions. Pt makes appropriate eye contact. Pt has a flat affect and mood is euthymic. Pt thought process is coherent and relevant.   Anette Riedel, NP states the pt does not meet criteria for inpt tx. EDP Palumbo, April, MD has been advised.   Diagnosis: Schizophrenia  Past Medical History:  Past Medical History:  Diagnosis Date  . Depression   . Schizophrenia [facility])     Past Surgical History:  Procedure Laterality Date  . BACK SURGERY      Family History: History reviewed. No pertinent family history.  Social History:  reports that he has been smoking cigarettes. He has never used smokeless tobacco. He reports current drug use. Drug: Marijuana. He reports that he does not drink alcohol.  Additional Social History:  Alcohol / Drug Use Pain Medications: See MAR Prescriptions: See MAR Over the Counter: See MAR History of alcohol / drug use?: No history of alcohol / drug abuse  CIWA: CIWA-Ar BP:  125/85 Pulse Rate: 65 COWS:    Allergies:  Allergies  Allergen Reactions  . Shellfish Allergy Hives and Itching    Home Medications: (Not in a hospital admission)   OB/GYN Status:  No LMP for male patient.  General Assessment Data Assessment unable to be completed: Yes Reason for not completing assessment: TTS cart currently in use, 2 pt's ahead of this consult. TTS to assess pt when cart is available  Location of Assessment: [facility] TTS Assessment: In system Is this a Tele or Face-to-Face Assessment?: Tele Assessment Is this an Initial Assessment or a Re-assessment for this encounter?: Initial Assessment Patient Accompanied by:: N/A Language Other than English: No Living Arrangements: Homeless/Shelter What gender do you identify as?: Male Marital status: Single Pregnancy Status: No Living Arrangements: Alone Can pt return to current living arrangement?: Yes Admission Status: Voluntary Is patient capable of signing voluntary admission?: Yes Referral Source: Self/Family/Friend Insurance type: MCD     Crisis Care Plan Living Arrangements: Alone Name of Psychiatrist: Beverly Sessions Name of Therapist: None  Education Status Is patient currently in school?: No Is the patient employed, unemployed or receiving disability?: Unemployed  Risk to self with the past 6 months Suicidal Ideation: No Has patient been a risk to self within the past 6 months prior to admission? : No Suicidal Intent: No Has patient had any suicidal intent within the past 6 months prior to admission? : No Is patient at  risk for suicide?: No Suicidal Plan?: No Has patient had any suicidal plan within the past 6 months prior to admission? : No Access to Means: No What has been your use of drugs/alcohol within the last 12 months?: pt denies, UDS pending during assessment Previous Attempts/Gestures: No Triggers for Past Attempts: None known Intentional Self Injurious Behavior: None Family Suicide History:  No Recent stressful life event(s): Financial Problems, Other (Comment)(homeless) Persecutory voices/beliefs?: No Depression: No Substance abuse history and/or treatment for substance abuse?: No Suicide prevention information given to non-admitted patients: Not applicable  Risk to Others within the past 6 months Homicidal Ideation: No Does patient have any lifetime risk of violence toward others beyond the six months prior to admission? : No Thoughts of Harm to Others: No Current Homicidal Intent: No Current Homicidal Plan: No Access to Homicidal Means: No History of harm to others?: No Assessment of Violence: None Noted Does patient have access to weapons?: No Criminal Charges Pending?: No Does patient have a court date: No Is patient on probation?: No  Psychosis Hallucinations: None noted Delusions: None noted  Mental Status Report Appearance/Hygiene: Unremarkable Eye Contact: Good Motor Activity: Freedom of movement Speech: Logical/coherent Level of Consciousness: Alert Mood: Euthymic Affect: Flat Anxiety Level: None Thought Processes: Relevant, Coherent Judgement: Unimpaired Orientation: Person, Place, Time, Situation, Appropriate for developmental age Obsessive Compulsive Thoughts/Behaviors: None  Cognitive Functioning Concentration: Normal Memory: Remote Intact, Recent Intact Is patient IDD: No Insight: Good Impulse Control: Good Appetite: Good Have you had any weight changes? : No Change Sleep: No Change Total Hours of Sleep: 8 Vegetative Symptoms: None  ADLScreening [facility] Assessment Services) Patient's cognitive ability adequate to safely complete daily activities?: Yes Patient able to express need for assistance with ADLs?: Yes Independently performs ADLs?: Yes (appropriate for developmental age)  Prior Inpatient Therapy Prior Inpatient Therapy: Yes Prior Therapy Dates: 02/2018, multiple admits Prior Therapy Facilty/Provider(s): [facility] Reason for  Treatment: Schizophrenia  Prior Outpatient Therapy Prior Outpatient Therapy: Yes Prior Therapy Dates: Current Prior Therapy Facilty/Provider(s): [facility] Reason for Treatment: schizophrenia Does patient have an ACCT team?: No Does patient have Intensive In-House Services?  : No Does patient have [facility] services? : Yes Does patient have P4CC services?: No  ADL Screening (condition at time of admission) Patient's cognitive ability adequate to safely complete daily activities?: Yes Is the patient deaf or have difficulty hearing?: No Does the patient have difficulty seeing, even when wearing glasses/contacts?: No Does the patient have difficulty concentrating, remembering, or making decisions?: No Patient able to express need for assistance with ADLs?: Yes Does the patient have difficulty dressing or bathing?: No Independently performs ADLs?: Yes (appropriate for developmental age) Does the patient have difficulty walking or climbing stairs?: No Weakness of Legs: None Weakness of Arms/Hands: None  Home Assistive Devices/Equipment Home Assistive Devices/Equipment: None    Abuse/Neglect Assessment (Assessment to be complete while patient is alone) Abuse/Neglect Assessment Can Be Completed: Yes Physical Abuse: Denies Verbal Abuse: Denies Sexual Abuse: Denies Exploitation of patient/patient's resources: Denies Self-Neglect: Denies     Merchant navy officer (For Healthcare) Does Patient Have a Medical Advance Directive?: No Would patient like information on creating a medical advance directive?: No - Patient declined          Disposition: Lerry Liner, NP states the pt does not meet criteria for inpt tx. EDP Palumbo, April, MD has been advised.   Disposition Initial Assessment Completed for this Encounter: Yes Disposition of Patient: Discharge Patient refused recommended treatment: No Mode of transportation if  patient is discharged/movement?: Walking  This service was  provided via telemedicine using a 2-way, interactive audio and video technology.  Names of all persons participating in this telemedicine service and their role in this encounter. Name: [name] Role: Patient  Name: Princess Bruins, [place] Role: TTS  Name: Lerry Liner, NP Role: [facility] Provider       [name] 01/09/2020 5:28 AM

## 2020-01-09 NOTE — Progress Notes (Signed)
TTS cart currently in use, 2 pt's ahead of this consult. TTS to assess pt when cart is available

## 2020-01-09 NOTE — ED Provider Notes (Signed)
[facility] DEPT Provider Note   CSN: [id number] Arrival date & time: 01/09/20  0302     History Warner chief complaint on file.   [name] is a 29 y.o. male.  The history is provided by the patient.  Mental Health Problem Presenting symptoms: Warner aggressive behavior, Warner agitation, Warner bizarre behavior, Warner delusions, Warner depression, Warner disorganized speech, Warner disorganized thought process, Warner hallucinations, Warner homicidal ideas, Warner paranoid behavior, Warner self-mutilation, Warner suicidal thoughts, Warner suicidal threats and Warner suicide attempt   Patient accompanied by: none. Degree of incapacity (severity):  Mild Onset quality:  Gradual Timing:  Rare Progression:  Unchanged Chronicity:  Recurrent Context: noncompliance   Context: not alcohol use   Treatment compliance:  Some of the time Relieved by:  Nothing Worsened by:  Nothing Ineffective treatments:  None tried Associated symptoms: Warner abdominal pain, Warner anhedonia, Warner anxiety, Warner appetite change, Warner chest pain, Warner decreased need for sleep, not distractible, Warner euphoric mood, Warner fatigue, Warner feelings of worthlessness, Warner headaches, Warner hypersomnia, Warner hyperventilation, Warner insomnia, Warner irritability, Warner poor judgment, Warner school problems and Warner weight change   Risk factors: hx of mental illness   Risk factors: Warner family hx of mental illness   Patient with schizophrenia discharged from [facility] on 3/29 presents for evaluation.  Patient is not taking his medication but feels fine but does not know why he is here.  He denies SI or HI Warner AH Warner VH. When asked what we can do for him he states he just needs to watch TV.  He denies depression.       Past Medical History:  Diagnosis Date  . Depression   . Schizophrenia [facility])     Patient Active Problem List   Diagnosis Date Noted  . Psychotic disorder ([place]) 01/05/2020  . Paranoid schizophrenia ([place]) 01/05/2020  . Schizophrenia ([place]) 01/05/2020  . Schizophrenia, paranoid type  ([place]) 02/14/2018  . Psychosis ([place])   . Cannabis use disorder, moderate, dependence ([place]) 05/10/2016    Past Surgical History:  Procedure Laterality Date  . BACK SURGERY         History reviewed. Warner pertinent family history.  Social History   Tobacco Use  . Smoking status: Current Some Day Smoker    Types: Cigarettes  . Smokeless tobacco: Never Used  Substance Use Topics  . Alcohol use: Warner  . Drug use: Yes    Types: Marijuana    Comment: rare    Home Medications Prior to Admission medications   Medication Sig Start Date End Date Taking? Authorizing Provider  benztropine (COGENTIN) 1 MG tablet Take 1 tablet (1 mg total) by mouth 2 (two) times daily. 01/08/20   Johnn Hai, MD  paliperidone (INVEGA SUSTENNA) 234 MG/1.5ML SUSY injection Inject 234 mg into the muscle once for 1 dose. Due 4/25 01/08/20 01/09/20  Johnn Hai, MD  risperiDONE (RISPERDAL) 3 MG tablet Take 2 tablets (6 mg total) by mouth at bedtime. 01/08/20   Johnn Hai, MD  traZODone (DESYREL) 100 MG tablet Take 1 tablet (100 mg total) by mouth at bedtime as needed for sleep. 01/08/20   Johnn Hai, MD    Allergies    Shellfish allergy  Review of Systems   Review of Systems  Constitutional: Negative for appetite change, fatigue and irritability.  HENT: Negative for congestion.   Eyes: Negative for visual disturbance.  Respiratory: Negative for shortness of breath.   Cardiovascular: Negative for chest pain.  Gastrointestinal: Negative for  abdominal pain.  Genitourinary: Negative for difficulty urinating.  Musculoskeletal: Negative for arthralgias.  Skin: Negative for rash.  Neurological: Negative for headaches.  Psychiatric/Behavioral: Negative for agitation, hallucinations, homicidal ideas, paranoia, self-injury and suicidal ideas. The patient is not nervous/anxious and does not have insomnia.   All other systems reviewed and are negative.   Physical Exam Updated Vital Signs BP 125/85 (BP Location: Right  Arm)   Pulse 65   Temp 98.1 F (36.7 C) (Oral)   Resp 18   SpO2 100%   Physical Exam Vitals and nursing note reviewed.  Constitutional:      General: He is not in acute distress.    Appearance: Normal appearance.  HENT:     Head: Normocephalic and atraumatic.     Nose: Nose normal.  Eyes:     Conjunctiva/sclera: Conjunctivae normal.     Pupils: Pupils are equal, round, and reactive to light.  Cardiovascular:     Rate and Rhythm: Normal rate and regular rhythm.     Pulses: Normal pulses.     Heart sounds: Normal heart sounds.  Pulmonary:     Effort: Pulmonary effort is normal.     Breath sounds: Normal breath sounds.  Abdominal:     General: Abdomen is flat. Bowel sounds are normal.     Tenderness: There is Warner abdominal tenderness. There is Warner guarding.  Musculoskeletal:        General: Normal range of motion.     Cervical back: Normal range of motion and neck supple.  Skin:    General: Skin is warm and dry.     Capillary Refill: Capillary refill takes less than 2 seconds.  Neurological:     General: Warner focal deficit present.     Mental Status: He is alert.     Deep Tendon Reflexes: Reflexes normal.  Psychiatric:        Mood and Affect: Mood normal.        Behavior: Behavior normal.     ED Results / Procedures / Treatments   Labs (all labs ordered are listed, but only abnormal results are displayed) Labs Reviewed - Warner data to display  EKG None  Radiology Warner results found.  Procedures Procedures (including critical care time)  Medications Ordered in ED Medications - Warner data to display  ED Course  I have reviewed the triage vital signs and the nursing notes.  Pertinent labs & imaging results that were available during my care of the patient were reviewed by me and considered in my medical decision making (see chart for details).    I do not see any signs of an acute psychiatric condition.  The patient has been medically cleared by me.    Final  Clinical Impression(s) / ED Diagnoses Final diagnoses:  Schizophrenia, unspecified type (HCC)   Cleared by TTS Warner inpatient criteria    Ruchel Brandenburger, MD 01/09/20 0174

## 2020-01-09 NOTE — ED Triage Notes (Signed)
Pt stated he feels odd after being discharge from [facility] yesterday.

## 2020-01-09 NOTE — Progress Notes (Addendum)
Received [name] directly from EMS, he was feeling odd after leaving [facility] earlier yesterday around noon. He stated he took the medication to leave the hospital, but does not plan to take the "crazy pills." He is unsure how we can help him at this time. He denied feeling suicidal or homicidal. He stated he was not informed of a diagnosis prior to discharge and denied receiving discharge paperwork. Pt stated he is homeless. TTS was notified for an assessment per Dr verbal order. Pt was notified of his discharged and given his clothes to get dress. He was escorted to the front door without incident at 0535 hrs. He refused to take the discharge AVS.

## 2020-01-12 ENCOUNTER — Other Ambulatory Visit: Payer: Self-pay

## 2020-01-12 ENCOUNTER — Encounter (HOSPITAL_COMMUNITY): Payer: Self-pay

## 2020-01-12 DIAGNOSIS — Z79899 Other long term (current) drug therapy: Secondary | ICD-10-CM | POA: Insufficient documentation

## 2020-01-12 DIAGNOSIS — R0981 Nasal congestion: Secondary | ICD-10-CM | POA: Insufficient documentation

## 2020-01-12 DIAGNOSIS — F17210 Nicotine dependence, cigarettes, uncomplicated: Secondary | ICD-10-CM | POA: Diagnosis not present

## 2020-01-12 NOTE — ED Triage Notes (Addendum)
Pt reports congestion and runny nose today after smoking some weed. Lung sounds clear. No respiratory distress noted.

## 2020-01-13 ENCOUNTER — Emergency Department (HOSPITAL_COMMUNITY)
Admission: EM | Admit: 2020-01-13 | Discharge: 2020-01-13 | Disposition: A | Discharge status: Home / Self Care | Payer: Medicaid Other | Attending: Emergency Medicine | Admitting: Emergency Medicine

## 2020-01-13 DIAGNOSIS — R0981 Nasal congestion: Secondary | ICD-10-CM

## 2020-01-13 NOTE — ED Provider Notes (Signed)
[facility] DEPT Provider Note  CSN: [id number] Arrival date & time: 01/12/20 2330  Chief Complaint(s) Nasal Congestion  HPI [name] is a 29 y.o. male with a past medical history listed below who presents to the emergency department for nasal congestion and a place to sleep.  [place]  He reports that he does not have any money to pay for room at the hotel.  Denies any suicidal ideation.  No homicidal ideation.  No auditory/visual hallucinations.  Did endorse to drinking small amount of alcohol this evening. No fevers or chills.  No coughing or congestion. Denies any other physical complaints.    HPI  Past Medical History Past Medical History:  Diagnosis Date  . Depression   . Schizophrenia [facility])    Patient Active Problem List   Diagnosis Date Noted  . Psychotic disorder ([place]) 01/05/2020  . Paranoid schizophrenia ([place]) 01/05/2020  . Schizophrenia ([place]) 01/05/2020  . Schizophrenia, paranoid type ([place]) 02/14/2018  . Psychosis ([place])   . Cannabis use disorder, moderate, dependence ([place]) 05/10/2016   Home Medication(s) Prior to Admission medications   Medication Sig Start Date End Date Taking? Authorizing Provider  benztropine (COGENTIN) 1 MG tablet Take 1 tablet (1 mg total) by mouth 2 (two) times daily. 01/08/20   Johnn Hai, MD  paliperidone (INVEGA SUSTENNA) 234 MG/1.5ML SUSY injection Inject 234 mg into the muscle once for 1 dose. Due 4/25 01/08/20 01/09/20  Johnn Hai, MD  risperiDONE (RISPERDAL) 3 MG tablet Take 2 tablets (6 mg total) by mouth at bedtime. 01/08/20   Johnn Hai, MD  traZODone (DESYREL) 100 MG tablet Take 1 tablet (100 mg total) by mouth at bedtime as needed for sleep. 01/08/20   Johnn Hai, MD                                                                                                                                    Past Surgical History Past Surgical History:  Procedure Laterality  Date  . BACK SURGERY     Family History History reviewed. No pertinent family history.  Social History Social History   Tobacco Use  . Smoking status: Current Some Day Smoker    Types: Cigarettes  . Smokeless tobacco: Never Used  Substance Use Topics  . Alcohol use: No  . Drug use: Yes    Types: Marijuana    Comment: rare   Allergies Shellfish allergy  Review of Systems Review of Systems All other systems are reviewed and are negative for acute change except as noted in the HPI  Physical Exam Vital Signs  I have reviewed the triage vital signs BP (!) 134/93 (BP Location: Left Arm)   Pulse 95   Temp 97.7 F (36.5 C) (Oral)   Resp 14   Ht 5\' 5"  (1.651 m)   Wt 68.5 kg   SpO2 99%   BMI 25.13  kg/m   Physical Exam Vitals reviewed.  Constitutional:      General: He is not in acute distress.    Appearance: He is well-developed. He is not diaphoretic.  HENT:     Head: Normocephalic and atraumatic.     Nose: Congestion and rhinorrhea present.  Eyes:     General: No scleral icterus.       Right eye: No discharge.        Left eye: No discharge.     Conjunctiva/sclera: Conjunctivae normal.     Pupils: Pupils are equal, round, and reactive to light.  Cardiovascular:     Rate and Rhythm: Normal rate and regular rhythm.     Heart sounds: No murmur. No friction rub. No gallop.   Pulmonary:     Effort: Pulmonary effort is normal. No respiratory distress.     Breath sounds: Normal breath sounds. No stridor. No rales.  Abdominal:     General: There is no distension.     Palpations: Abdomen is soft.     Tenderness: There is no abdominal tenderness.  Musculoskeletal:        General: No tenderness.     Cervical back: Normal range of motion and neck supple.  Skin:    General: Skin is warm and dry.     Findings: No erythema or rash.  Neurological:     Mental Status: He is alert and oriented to person, place, and time.     ED Results and Treatments Labs (all labs  ordered are listed, but only abnormal results are displayed) Labs Reviewed - No data to display                                                                                                                       EKG  EKG Interpretation  Date/Time:    Ventricular Rate:    PR Interval:    QRS Duration:   QT Interval:    QTC Calculation:   R Axis:     Text Interpretation:        Radiology No results found.  Pertinent labs & imaging results that were available during my care of the patient were reviewed by me and considered in my medical decision making (see chart for details).  Medications Ordered in ED Medications - No data to display  Procedures Procedures  (including critical care time)  Medical Decision Making / ED Course I have reviewed the nursing notes for this encounter and the patient's prior records (if available in EHR or on provided paperwork).   [name] was evaluated in Emergency Department on 01/13/2020 for the symptoms described in the history of present illness. He was evaluated in the context of the global COVID-19 pandemic, which necessitated consideration that the patient might be at risk for infection with the SARS-CoV-2 virus that causes COVID-19. Institutional protocols and algorithms that pertain to the evaluation of patients at risk for COVID-19 are in a state of rapid change based on information released by regulatory bodies including the CDC and federal and state organizations. These policies and algorithms were followed during the patient's care in the ED.  Afebrile with stable vital signs.  His lung clear to auscultation. Patient's main concern is a place to sleep. Does not appear to be acutely psychotic and does not appear to be a threat to himself or others.  No acute psychiatric disorder requiring behavioral health  evaluation at this time.  Patient provided with food.  Allowed to rest.  Clinically sober.       Final Clinical Impression(s) / ED Diagnoses Final diagnoses:  Nasal congestion    The patient appears reasonably screened and/or stabilized for discharge and I doubt any other medical condition or other [facility] requiring further screening, evaluation, or treatment in the ED at this time prior to discharge. Safe for discharge with strict return precautions.  Disposition: Discharge  Condition: Good  I have discussed the results, Dx and Tx plan with the patient/family who expressed understanding and agree(s) with the plan. Discharge instructions discussed at length. The patient/family was given strict return precautions who verbalized understanding of the instructions. No further questions at time of discharge.    ED Discharge Orders    None       Follow Up: Primary care provider  Schedule an appointment as soon as possible for a visit       This chart was dictated using voice recognition software.  Despite best efforts to proofread,  errors can occur which can change the documentation meaning.   [name], MD 01/13/20 [phone]

## 2020-01-13 NOTE — Discharge Instructions (Addendum)
You may take over-the-counter medicine for symptomatic relief, such as Tylenol, Motrin, TheraFlu, Alka seltzer , black elderberry, etc. Please limit acetaminophen (Tylenol) to 4000 mg and Ibuprofen (Motrin, Advil, etc.) to 2400 mg for a 24hr period. Please note that other over-the-counter medicine may contain acetaminophen or ibuprofen as a component of their ingredients.   

## 2020-02-19 ENCOUNTER — Emergency Department (HOSPITAL_COMMUNITY)
Admission: EM | Admit: 2020-02-19 | Discharge: 2020-02-22 | Disposition: A | Discharge status: Other Acute Inpatient Hospital | Payer: Medicaid Other | Attending: Emergency Medicine | Admitting: Emergency Medicine

## 2020-02-19 ENCOUNTER — Encounter (HOSPITAL_COMMUNITY): Payer: Self-pay | Admitting: Emergency Medicine

## 2020-02-19 DIAGNOSIS — R4689 Other symptoms and signs involving appearance and behavior: Secondary | ICD-10-CM

## 2020-02-19 DIAGNOSIS — F1220 Cannabis dependence, uncomplicated: Secondary | ICD-10-CM | POA: Diagnosis present

## 2020-02-19 DIAGNOSIS — Z79899 Other long term (current) drug therapy: Secondary | ICD-10-CM | POA: Insufficient documentation

## 2020-02-19 DIAGNOSIS — Z20822 Contact with and (suspected) exposure to covid-19: Secondary | ICD-10-CM | POA: Insufficient documentation

## 2020-02-19 DIAGNOSIS — F200 Paranoid schizophrenia: Secondary | ICD-10-CM | POA: Insufficient documentation

## 2020-02-19 DIAGNOSIS — F17210 Nicotine dependence, cigarettes, uncomplicated: Secondary | ICD-10-CM | POA: Insufficient documentation

## 2020-02-19 DIAGNOSIS — F2089 Other schizophrenia: Secondary | ICD-10-CM | POA: Diagnosis present

## 2020-02-19 LAB — CBC WITH DIFFERENTIAL/PLATELET
Abs Immature Granulocytes: 0.01 10*3/uL (ref 0.00–0.07)
Basophils Absolute: 0 10*3/uL (ref 0.0–0.1)
Basophils Relative: 1 %
Eosinophils Absolute: 0.5 10*3/uL (ref 0.0–0.5)
Eosinophils Relative: 9 %
HCT: 40.5 % (ref 39.0–52.0)
Hemoglobin: 13.9 g/dL (ref 13.0–17.0)
Immature Granulocytes: 0 %
Lymphocytes Relative: 54 %
Lymphs Abs: 3 10*3/uL (ref 0.7–4.0)
MCH: 31.2 pg (ref 26.0–34.0)
MCHC: 34.3 g/dL (ref 30.0–36.0)
MCV: 91 fL (ref 80.0–100.0)
Monocytes Absolute: 0.4 10*3/uL (ref 0.1–1.0)
Monocytes Relative: 7 %
Neutro Abs: 1.6 10*3/uL — ABNORMAL LOW (ref 1.7–7.7)
Neutrophils Relative %: 29 %
Platelets: 204 10*3/uL (ref 150–400)
RBC: 4.45 MIL/uL (ref 4.22–5.81)
RDW: 13 % (ref 11.5–15.5)
WBC: 5.5 10*3/uL (ref 4.0–10.5)
nRBC: 0 % (ref 0.0–0.2)

## 2020-02-19 LAB — COMPREHENSIVE METABOLIC PANEL
ALT: 13 U/L (ref 0–44)
AST: 18 U/L (ref 15–41)
Albumin: 4 g/dL (ref 3.5–5.0)
Alkaline Phosphatase: 36 U/L — ABNORMAL LOW (ref 38–126)
Anion gap: 8 (ref 5–15)
BUN: 15 mg/dL (ref 6–20)
CO2: 28 mmol/L (ref 22–32)
Calcium: 9.2 mg/dL (ref 8.9–10.3)
Chloride: 103 mmol/L (ref 98–111)
Creatinine, Ser: 1.04 mg/dL (ref 0.61–1.24)
GFR calc Af Amer: >60 mL/min (ref >60)
GFR calc non Af Amer: >60 mL/min (ref >60)
Glucose, Bld: 84 mg/dL (ref 70–99)
Potassium: 4.1 mmol/L (ref 3.5–5.1)
Sodium: 139 mmol/L (ref 135–145)
Total Bilirubin: 0.8 mg/dL (ref 0.3–1.2)
Total Protein: 6.4 g/dL — ABNORMAL LOW (ref 6.5–8.1)

## 2020-02-19 LAB — ACETAMINOPHEN LEVEL: Acetaminophen (Tylenol), Serum: <10 ug/mL — ABNORMAL LOW (ref 10–30)

## 2020-02-19 LAB — ETHANOL: Alcohol, Ethyl (B): <10 mg/dL (ref <10)

## 2020-02-19 MED ORDER — RISPERIDONE 2 MG PO TABS
6.0000 mg | ORAL_TABLET | Freq: Every day | ORAL | Status: DC
  Administered 2020-02-20 – 2020-02-21 (×2): 6 mg via ORAL
  Filled 2020-02-19 (×2): qty 3

## 2020-02-19 MED ORDER — ALUM & MAG HYDROXIDE-SIMETH 200-200-20 MG/5ML PO SUSP: 30.0000 mL | Freq: Four times a day (QID) | ORAL | Status: DC | PRN

## 2020-02-19 MED ORDER — ZIPRASIDONE HCL 20 MG PO CAPS
20.0000 mg | ORAL_CAPSULE | Freq: Once | ORAL | Status: DC
  Filled 2020-02-19: qty 1

## 2020-02-19 MED ORDER — ZIPRASIDONE MESYLATE 20 MG IM SOLR: 20.0000 mg | Freq: Once | INTRAMUSCULAR | Status: DC

## 2020-02-19 MED ORDER — TRAZODONE HCL 100 MG PO TABS: 100.0000 mg | ORAL_TABLET | Freq: Every evening | ORAL | Status: DC | PRN

## 2020-02-19 MED ORDER — ACETAMINOPHEN 325 MG PO TABS: 650.0000 mg | ORAL_TABLET | ORAL | Status: DC | PRN

## 2020-02-19 MED ORDER — BENZTROPINE MESYLATE 1 MG PO TABS
1.0000 mg | ORAL_TABLET | Freq: Two times a day (BID) | ORAL | Status: DC
  Administered 2020-02-19 – 2020-02-22 (×5): 1 mg via ORAL
  Filled 2020-02-19 (×6): qty 1

## 2020-02-19 MED ORDER — RISPERIDONE 2 MG PO TABS: 6.0000 mg | ORAL_TABLET | Freq: Every day | ORAL | Status: DC

## 2020-02-19 NOTE — BH Assessment (Addendum)
Assessment Note  [name] is an 29 y.o. male with history of Schizophrenia. Patient presents to [facility], voluntarily. States that a nurse brought him to [facility]. The nurse's identify is unknown. However, [place] staff noted that the nurse patient was brought in by "nurse that he met at a store". Nurse left before questioned by this RN. Registration was told by male "nurse" that pt is paranoid, SI and wanting to harm others.  Upon assessing patient he was unable to explain the reason or need to present to [facility]. He answered most questions asked, "I don't know, I don't know". The more questions asked the more he appeared to became agitated and/or irritable. Patient did express that he needs help but was unable to provide any further details. He appeared to lack decision making skills and needed guidance in answering questions appropriately.   Patient asked if he was experiencing suicidal ideations. He initially states, "I don't know, I don't know, I don't want to answer any questions". He later responded, "No, No, I don't want to hurt myself".  Patient also denied a history of self mutilating behaviors. Patient denied access to means. Denies family history of mental health illness. He was asked about stressors and states, "Everything, I don't know, I don't know". He was asked about appetite and sleep but responding, "I don't know".   Denies HI. He is unsure if he has a history of violent and/or aggressive behaviors. He did not confirm or deny a history of legal issues. He denied AVH's. However, appears paranoid and their may be some question of delusional behaviors as noted.   Patient did not respond when asked about history of substance use. However, previous notes indicate that their may be a history of marijuana use.   Patient is not oriented to time, person, place, and situation. He had difficulty providing his name. He asked this clinician where he was currently at and why he was at [facility]. Speech is  pressured and times. Mood is irritable and agitated. Affect is irritable. Patient's judgement and insight are both poor. Impulse control is poor.   Patient asked if their is anyone to call to obtain collateral information. Patient states, "No, do not call anyone" and "If you need information you get it from me".   Diagnosis: Schizophrenia   Past Medical History:  Past Medical History:  Diagnosis Date  . Depression   . Schizophrenia [facility])     Past Surgical History:  Procedure Laterality Date  . BACK SURGERY      Family History: No family history on file.  Social History:  reports that he has been smoking cigarettes. He has never used smokeless tobacco. He reports current drug use. Drug: Marijuana. He reports that he does not drink alcohol.  Additional Social History:  Alcohol / Drug Use Pain Medications: See MAR Over the Counter: See MAR History of alcohol / drug use?: No history of alcohol / drug abuse Negative Consequences of Use: Personal relationships Substance #1 Name of Substance 1: "I don't know, I don't know"; patient noted to have a history of THC use 1 - Age of First Use: unk 1 - Amount (size/oz): unk 1 - Frequency: unk 1 - Duration: unk 1 - Last Use / Amount: unk  CIWA: CIWA-Ar BP: 136/71 Pulse Rate: 89 COWS:    Allergies:  Allergies  Allergen Reactions  . Shellfish Allergy Hives and Itching    Home Medications: (Not in a hospital admission)   OB/GYN Status:  No LMP for  male patient.  General Assessment Data Location of Assessment: [facility] TTS Assessment: In system Is this a Tele or Face-to-Face Assessment?: Tele Assessment Is this an Initial Assessment or a Re-assessment for this encounter?: Initial Assessment Patient Accompanied by:: N/A Language Other than English: No Living Arrangements: Homeless/Shelter What gender do you identify as?: Male Marital status: Single Maiden name: (n/a) Pregnancy Status: No Living Arrangements: Alone Can pt  return to current living arrangement?: Yes Admission Status: Voluntary Is patient capable of signing voluntary admission?: Yes Referral Source: Psychiatrist Insurance type: (Medicaid )     Crisis Care Plan Living Arrangements: Alone Name of Psychiatrist: Beverly Sessions Name of Therapist: None  Education Status Is the patient employed, unemployed or receiving disability?: Unemployed  Risk to self with the past 6 months Suicidal Ideation: No Has patient been a risk to self within the past 6 months prior to admission? : No Suicidal Intent: No Has patient had any suicidal intent within the past 6 months prior to admission? : No Is patient at risk for suicide?: No, but patient needs Medical Clearance Suicidal Plan?: No Has patient had any suicidal plan within the past 6 months prior to admission? : No Access to Means: No What has been your use of drugs/alcohol within the last 12 months?: (patient denies ) Previous Attempts/Gestures: No How many times?: (0) Other Self Harm Risks: (n/a) Triggers for Past Attempts: None known Intentional Self Injurious Behavior: None Family Suicide History: No Recent stressful life event(s): Other (Comment)("I need someone to help me, I don't know, I don't know") Persecutory voices/beliefs?: No Depression: No  Risk to Others within the past 6 months Homicidal Ideation: No Does patient have any lifetime risk of violence toward others beyond the six months prior to admission? : No Thoughts of Harm to Others: No Current Homicidal Intent: No Current Homicidal Plan: No Access to Homicidal Means: No Identified Victim: (no ) History of harm to others?: No Assessment of Violence: None Noted Violent Behavior Description: (currently calm and cooperative ) Does patient have access to weapons?: No Criminal Charges Pending?: No Does patient have a court date: No Is patient on probation?: No  Psychosis Hallucinations: None noted Delusions: None  noted  Mental Status Report Appearance/Hygiene: Unremarkable Eye Contact: Good Motor Activity: Freedom of movement Speech: Logical/coherent Level of Consciousness: Alert Affect: Flat Anxiety Level: None Thought Processes: Coherent, Relevant Judgement: Unimpaired Orientation: Person, Place, Time, Situation, Appropriate for developmental age Obsessive Compulsive Thoughts/Behaviors: None  Cognitive Functioning Concentration: Normal Memory: Recent Intact, Remote Intact Is patient IDD: No Insight: Good Impulse Control: Good Appetite: Good Have you had any weight changes? : No Change Sleep: No Change Total Hours of Sleep: (8 hrs of sleep per night) Vegetative Symptoms: None  ADLScreening [facility] Assessment Services) Patient's cognitive ability adequate to safely complete daily activities?: Yes Patient able to express need for assistance with ADLs?: Yes Independently performs ADLs?: Yes (appropriate for developmental age)  Prior Inpatient Therapy Prior Inpatient Therapy: Yes Prior Therapy Dates: 02/2018, multiple admits Prior Therapy Facilty/Provider(s): [facility] Reason for Treatment: Schizophrenia  Prior Outpatient Therapy Prior Outpatient Therapy: Yes Prior Therapy Dates: Current Prior Therapy Facilty/Provider(s): [facility] Reason for Treatment: schizophrenia Does patient have an ACCT team?: No Does patient have Intensive In-House Services?  : No Does patient have [facility] services? : Yes Does patient have P4CC services?: No  ADL Screening (condition at time of admission) Patient's cognitive ability adequate to safely complete daily activities?: Yes Is the patient deaf or have difficulty hearing?: No Does  the patient have difficulty seeing, even when wearing glasses/contacts?: No Does the patient have difficulty concentrating, remembering, or making decisions?: No Patient able to express need for assistance with ADLs?: Yes Does the patient have difficulty dressing or  bathing?: No Independently performs ADLs?: Yes (appropriate for developmental age) Does the patient have difficulty walking or climbing stairs?: No Weakness of Legs: None Weakness of Arms/Hands: None  Home Assistive Devices/Equipment Home Assistive Devices/Equipment: None  Therapy Consults (therapy consults require a physician order) PT Evaluation Needed: No OT Evalulation Needed: No SLP Evaluation Needed: No Abuse/Neglect Assessment (Assessment to be complete while patient is alone) Abuse/Neglect Assessment Can Be Completed: Yes Physical Abuse: Denies Verbal Abuse: Denies Sexual Abuse: Denies Exploitation of patient/patient's resources: Denies Self-Neglect: Denies Values / Beliefs Cultural Requests During Hospitalization: None Spiritual Requests During Hospitalization: None   Advance Directives (For Healthcare) Does Patient Have a Medical Advance Directive?: (pt not answering) Nutrition Screen- [facility] Adult/[facility] Patient's home diet: Regular Has the patient recently lost weight without trying?: No Has the patient been eating poorly because of a decreased appetite?: No Malnutrition Screening Tool Score: 0        Disposition: Per Merlyn Lot, NP, patient meets criteria for overnight observation. Pending am psych evaluation.  Disposition Initial Assessment Completed for this Encounter: Yes  On Site Evaluation by:   Reviewed with Physician:    Waldon Merl 02/19/2020 5:09 PM

## 2020-02-19 NOTE — Progress Notes (Signed)
Received [place] from the main ED, stated he is not doing well. He cannot verbalize more about not doing well. He was medicated per order and went to bed with the sitter at the bedside. He slept throughout the night.  [name] - his aunt /guardian called looking for [facility]. She stated she left him at the barber shop in the morning on 5/13. Her number is [phone].

## 2020-02-19 NOTE — ED Provider Notes (Signed)
[facility] DEPT Provider Note   CSN: [id number] Arrival date & time: 02/19/20  1535     History No chief complaint on file.   [name] is a 29 y.o. male.  HPI Patient reportedly was brought in by "a nurse that he met at the store".  The person who presented with the patient but did not stay for additional history told registration that the patient is paranoid, suicidal and wanting to harm others.  The patient is up and pacing around.  He is agitated.  He is telling me he needs help, it is no game.  He reiterates do not play games with me.  He tells me he does not know what is going on.  He does not know how he feels but he knows that he is really agitated and out-of-control.  He tells me he cannot specify if he is suicidal or homicidal.  He is not sure.  He then reiterates that he needs help and do not play games.  He says he is unsure if he is having any hallucinations.  This is the total extent of the type of conversation.  He is no more specific about what is happened before coming into the emergency department or any other questions relating to drug use or his recent medical health.    Past Medical History:  Diagnosis Date  . Depression   . Schizophrenia [facility])     Patient Active Problem List   Diagnosis Date Noted  . Psychotic disorder ([place]) 01/05/2020  . Paranoid schizophrenia ([place]) 01/05/2020  . Schizophrenia ([place]) 01/05/2020  . Schizophrenia, paranoid type ([place]) 02/14/2018  . Psychosis ([place])   . Cannabis use disorder, moderate, dependence ([place]) 05/10/2016    Past Surgical History:  Procedure Laterality Date  . BACK SURGERY         No family history on file.  Social History   Tobacco Use  . Smoking status: Current Some Day Smoker    Types: Cigarettes  . Smokeless tobacco: Never Used  Substance Use Topics  . Alcohol use: No  . Drug use: Yes    Types: Marijuana    Comment: rare    Home Medications Prior to Admission  medications   Medication Sig Start Date End Date Taking? Authorizing Provider  benztropine (COGENTIN) 1 MG tablet Take 1 tablet (1 mg total) by mouth 2 (two) times daily. 01/08/20   Johnn Hai, MD  paliperidone (INVEGA SUSTENNA) 234 MG/1.5ML SUSY injection Inject 234 mg into the muscle once for 1 dose. Due 4/25 01/08/20 01/09/20  Johnn Hai, MD  risperiDONE (RISPERDAL) 3 MG tablet Take 2 tablets (6 mg total) by mouth at bedtime. 01/08/20   Johnn Hai, MD  traZODone (DESYREL) 100 MG tablet Take 1 tablet (100 mg total) by mouth at bedtime as needed for sleep. 01/08/20   Johnn Hai, MD    Allergies    Shellfish allergy  Review of Systems   Review of Systems Level 5 caveat cannot obtain review of systems due to agitation. Physical Exam Updated Vital Signs BP 136/71 (BP Location: Left Arm)   Pulse 89   Temp 98.3 F (36.8 C) (Oral)   Resp 16   SpO2 98%   Physical Exam Constitutional:      Comments: Patient is alert and walking around the emergency department.  As I approached the room he is out telling the nursing staff that he needs someone to get into see him.  His gait is coordinated.  His  speech is clear.  HENT:     Head: Normocephalic and atraumatic.  Eyes:     Extraocular Movements: Extraocular movements intact.  Pulmonary:     Effort: Pulmonary effort is normal.  Musculoskeletal:        General: Normal range of motion.  Skin:    General: Skin is warm and dry.  Neurological:     General: No focal deficit present.     Coordination: Coordination normal.  Psychiatric:     Comments: Moderately agitated but directable.     ED Results / Procedures / Treatments   Labs (all labs ordered are listed, but only abnormal results are displayed) Labs Reviewed - No data to display  EKG None  Radiology No results found.  Procedures Procedures (including critical care time)  Medications Ordered in ED Medications - No data to display  ED Course  I have reviewed the triage  vital signs and the nursing notes.  Pertinent labs & imaging results that were available during my care of the patient were reviewed by me and considered in my medical decision making (see chart for details).    MDM Rules/Calculators/A&P                     Patient presents as outlined above.  He has known history of paranoid schizophrenia and psychotic episodes.  Patient is very disorganized in his thoughts.  He is agitated and pacing.  Patient reports that he is currently out of control and cannot make any commitment to safety for suicidal or homicidal ideations.  A bystander who escorted the patient to the hospital stated patient did exhibit suicidal and homicidal thoughts although that person did not remain to give additional history.  At this point, with patient agitated, pacing and verbally asking for help and reporting he is out of control, IVC paperwork is done for patient and staff safety.  Patient was agreeable to taking Geodon orally.  At this time will proceed with TTS consult for paranoid schizophrenia with apparent psychotic episode.  Patient does not exhibit signs of medical illness at this time.  Will obtain screening lab work.  Final Clinical Impression(s) / ED Diagnoses Final diagnoses:  Aggressive behavior  Paranoid schizophrenia ([place])    Rx / DC Orders ED Discharge Orders    None       Charlesetta Shanks, MD 02/19/20 [phone]

## 2020-02-19 NOTE — ED Notes (Signed)
Pt yelling out in triage room. This RN went to see what patient needed. Pt states, " I need help and yall aren't helping me". Informed patient that waiting for a doctor to come see patient. They would be coming shortly and they would be able to help him when once they evaluate him. Pt replies, "well I am getting frustrated".

## 2020-02-19 NOTE — ED Notes (Signed)
Pt expressed he "Was not in the mood" when asked to perform blood draw, will monitor and attempt when pt is in a better mood.

## 2020-02-19 NOTE — ED Triage Notes (Signed)
Pt not answering triage questions when asked by this RN. Pt stares off in space.  Pt brought in by "nurse that he met at a store". Nurse left before questioned by this RN. Registration was told by male "nurse" that pt is paranoid, SI and wanting to harm others.

## 2020-02-19 NOTE — BH Assessment (Signed)
Per Ophelia Shoulder, NP, patient to remain in the ED overnight for observation. Pending am psych evaluation.

## 2020-02-20 LAB — RAPID URINE DRUG SCREEN, HOSP PERFORMED
Amphetamines: NOT DETECTED
Barbiturates: NOT DETECTED
Benzodiazepines: NOT DETECTED
Cocaine: NOT DETECTED
Opiates: NOT DETECTED
Tetrahydrocannabinol: POSITIVE — AB

## 2020-02-20 LAB — URINALYSIS, ROUTINE W REFLEX MICROSCOPIC
Bilirubin Urine: NEGATIVE
Glucose, UA: NEGATIVE mg/dL
Hgb urine dipstick: NEGATIVE
Ketones, ur: NEGATIVE mg/dL
Leukocytes,Ua: NEGATIVE
Nitrite: NEGATIVE
Protein, ur: NEGATIVE mg/dL
Specific Gravity, Urine: 1.023 (ref 1.005–1.030)
pH: 6 (ref 5.0–8.0)

## 2020-02-20 LAB — SARS CORONAVIRUS 2 BY RT PCR (HOSPITAL ORDER, PERFORMED IN ~~LOC~~ HOSPITAL LAB): SARS Coronavirus 2: NEGATIVE

## 2020-02-20 NOTE — ED Notes (Signed)
Pt's aunt, Ilda Basset [phone], is working on being pt's POA, but not guardian.  He lives with her.  She hopes that he will be admitted to "[facility]" because he needs to get back on his medications.  He is not taking po meds or his Invega injection.

## 2020-02-20 NOTE — Consult Note (Signed)
[facility]  02/20/2020 1:33 PM [name]  MRN:  [id number] Subjective:  "I don't need no meds, I'm in and out, nobody can keep up with me." Patient assessed by nurse practitioner along with Dr Lucianne MussKumar. Appears paranoid, states "I cannot walk right."  Patient observed ambulating in room, no distress noted. Patient denies suicidal or homicidal ideation ideations at this time.  Patient denies history of self-harm.  Patient denies auditory visual hallucinations. Patient reports he resides with his aunt "but we do not see eye to eye." Patient currently noncompliant with home medications. Emergency department staff spoke with patient's aunt who verbalizes concerns that patient is paranoid and refusing medications.  Principal Problem: <principal problem not specified> Diagnosis:  Active Problems:   * No active hospital problems. *  Total Time spent with patient: 20 minutes  Past Psychiatric History: Cannabis use disorder, schizophrenia  Past Medical History:  Past Medical History:  Diagnosis Date  . Depression   . Schizophrenia [facility])     Past Surgical History:  Procedure Laterality Date  . BACK SURGERY     Family History: No family history on file. Family Psychiatric  History: Unknown Social History:  Social History   Substance and Sexual Activity  Alcohol Use No     Social History   Substance and Sexual Activity  Drug Use Yes  . Types: Marijuana   Comment: rare    Social History   Socioeconomic History  . Marital status: Single    Spouse name: Not on file  . Number of children: Not on file  . Years of education: Not on file  . Highest education level: Not on file  Occupational History  . Occupation: unemployed  Tobacco Use  . Smoking status: Current Some Day Smoker    Types: Cigarettes  . Smokeless tobacco: Never Used  Substance and Sexual Activity  . Alcohol use: No  . Drug use: Yes    Types: Marijuana    Comment: rare  . Sexual activity: Yes     Birth control/protection: Condom  Other Topics Concern  . Not on file  Social History Narrative   UTA certain topics - tangential thought process, cannot answer clearly. Pt not sure about school; reportedly is homeless.    Social Determinants of Health   Financial Resource Strain:   . Difficulty of Paying Living Expenses:   Food Insecurity:   . Worried About Programme researcher, broadcasting/film/videounning Out of Food in the Last Year:   . Baristaan Out of Food in the Last Year:   Transportation Needs:   . Freight forwarderLack of Transportation (Medical):   Marland Kitchen. Lack of Transportation (Non-Medical):   Physical Activity:   . Days of Exercise per Week:   . Minutes of Exercise per Session:   Stress:   . Feeling of Stress :   Social Connections:   . Frequency of Communication with Friends and Family:   . Frequency of Social Gatherings with Friends and Family:   . Attends Religious Services:   . Active Member of Clubs or Organizations:   . Attends BankerClub or Organization Meetings:   Marland Kitchen. Marital Status:     Sleep: Fair  Appetite:  Fair  Current Medications: Current Facility-Administered Medications  Medication Dose Route Frequency Provider Last Rate Last Admin  . acetaminophen (TYLENOL) tablet 650 mg  650 mg Oral Q4H PRN Arby BarrettePfeiffer, Marcy, MD      . alum & mag hydroxide-simeth (MAALOX/MYLANTA) 200-200-20 MG/5ML suspension 30 mL  30 mL Oral Q6H PRN Pfeiffer,  Lebron Conners, MD      . benztropine (COGENTIN) tablet 1 mg  1 mg Oral BID Arby [place], MD   1 mg at 02/19/20 2243  . risperiDONE (RISPERDAL) tablet 6 mg  6 mg Oral QHS Pfeiffer, Marcy, MD      . traZODone (DESYREL) tablet 100 mg  100 mg Oral QHS PRN Arby [place], MD      . ziprasidone (GEODON) capsule 20 mg  20 mg Oral Once Arby [place], MD       Current Outpatient Medications  Medication Sig Dispense Refill  . benztropine (COGENTIN) 1 MG tablet Take 1 tablet (1 mg total) by mouth 2 (two) times daily. 60 tablet 2  . paliperidone (INVEGA SUSTENNA) 234 MG/1.5ML SUSY injection Inject 234 mg  into the muscle once for 1 dose. Due 4/25 (Patient not taking: Reported on 02/19/2020) 1.5 mL 11  . risperiDONE (RISPERDAL) 3 MG tablet Take 2 tablets (6 mg total) by mouth at bedtime. 60 tablet 3  . traZODone (DESYREL) 100 MG tablet Take 1 tablet (100 mg total) by mouth at bedtime as needed for sleep. 90 tablet 1    Lab Results:  Results for orders placed or performed during the hospital encounter of 02/19/20 (from the past 48 hour(s))  Acetaminophen level     Status: Abnormal   Collection Time: 02/19/20  8:02 PM  Result Value Ref Range   Acetaminophen (Tylenol), Serum <10 (L) 10 - 30 ug/mL    Comment: (NOTE) Therapeutic concentrations vary significantly. A range of 10-30 ug/mL  may be an effective concentration for many patients. However, some  are best treated at concentrations outside of this range. Acetaminophen concentrations >150 ug/mL at 4 hours after ingestion  and >50 ug/mL at 12 hours after ingestion are often associated with  toxic reactions. Performed at [facility], [address], [place], [place] [address]   Comprehensive metabolic panel     Status: Abnormal   Collection Time: 02/19/20  8:02 PM  Result Value Ref Range   Sodium 139 135 - 145 mmol/L   Potassium 4.1 3.5 - 5.1 mmol/L   Chloride 103 98 - 111 mmol/L   CO2 28 22 - 32 mmol/L   Glucose, Bld 84 70 - 99 mg/dL    Comment: Glucose reference range applies only to samples taken after fasting for at least 8 hours.   BUN 15 6 - 20 mg/dL   Creatinine, Ser 4.40 0.61 - 1.24 mg/dL   Calcium 9.2 8.9 - 10.2 mg/dL   Total Protein 6.4 (L) 6.5 - 8.1 g/dL   Albumin 4.0 3.5 - 5.0 g/dL   AST 18 15 - 41 U/L   ALT 13 0 - 44 U/L   Alkaline Phosphatase 36 (L) 38 - 126 U/L   Total Bilirubin 0.8 0.3 - 1.2 mg/dL   GFR calc non Af Amer >60 >60 mL/min   GFR calc Af Amer >60 >60 mL/min   Anion gap 8 5 - 15    Comment: Performed at [facility], [address], [place], [place] [address]   Ethanol     Status: None   Collection Time: 02/19/20  8:02 PM  Result Value Ref Range   Alcohol, Ethyl (B) <10 <10 mg/dL    Comment: (NOTE) Lowest detectable limit for serum alcohol is 10 mg/dL. For medical purposes only. Performed at [facility], [address], [place], [place] [address]   CBC with Differential     Status: Abnormal  Collection Time: 02/19/20  8:02 PM  Result Value Ref Range   WBC 5.5 4.0 - 10.5 K/uL   RBC 4.45 4.22 - 5.81 MIL/uL   Hemoglobin 13.9 13.0 - 17.0 g/dL   HCT 35.5 97.4 - 16.3 %   MCV 91.0 80.0 - 100.0 fL   MCH 31.2 26.0 - 34.0 pg   MCHC 34.3 30.0 - 36.0 g/dL   RDW 84.5 36.4 - 68.0 %   Platelets 204 150 - 400 K/uL   nRBC 0.0 0.0 - 0.2 %   Neutrophils Relative % 29 %   Neutro Abs 1.6 (L) 1.7 - 7.7 K/uL   Lymphocytes Relative 54 %   Lymphs Abs 3.0 0.7 - 4.0 K/uL   Monocytes Relative 7 %   Monocytes Absolute 0.4 0.1 - 1.0 K/uL   Eosinophils Relative 9 %   Eosinophils Absolute 0.5 0.0 - 0.5 K/uL   Basophils Relative 1 %   Basophils Absolute 0.0 0.0 - 0.1 K/uL   Immature Granulocytes 0 %   Abs Immature Granulocytes 0.01 0.00 - 0.07 K/uL    Comment: Performed at [facility], [address] [place], [place] [address]  Urinalysis, Routine w reflex microscopic     Status: None   Collection Time: 02/20/20  8:06 AM  Result Value Ref Range   Color, Urine YELLOW YELLOW   APPearance CLEAR CLEAR   Specific Gravity, Urine 1.023 1.005 - 1.030   pH 6.0 5.0 - 8.0   Glucose, UA NEGATIVE NEGATIVE mg/dL   Hgb urine dipstick NEGATIVE NEGATIVE   Bilirubin Urine NEGATIVE NEGATIVE   Ketones, ur NEGATIVE NEGATIVE mg/dL   Protein, ur NEGATIVE NEGATIVE mg/dL   Nitrite NEGATIVE NEGATIVE   Leukocytes,Ua NEGATIVE NEGATIVE    Comment: Performed at [facility], [address], [place], [place] [address]  Urine rapid drug screen (hosp performed)     Status: Abnormal   Collection Time: 02/20/20  8:06 AM   Result Value Ref Range   Opiates NONE DETECTED NONE DETECTED   Cocaine NONE DETECTED NONE DETECTED   Benzodiazepines NONE DETECTED NONE DETECTED   Amphetamines NONE DETECTED NONE DETECTED   Tetrahydrocannabinol POSITIVE (A) NONE DETECTED   Barbiturates NONE DETECTED NONE DETECTED    Comment: (NOTE) DRUG SCREEN FOR MEDICAL PURPOSES ONLY.  IF CONFIRMATION IS NEEDED FOR ANY PURPOSE, NOTIFY LAB WITHIN 5 DAYS. LOWEST DETECTABLE LIMITS FOR URINE DRUG SCREEN Drug Class                     Cutoff (ng/mL) Amphetamine and metabolites    1000 Barbiturate and metabolites    200 Benzodiazepine                 200 Tricyclics and metabolites     300 Opiates and metabolites        300 Cocaine and metabolites        300 THC                            50 Performed at [facility], [address], [place], [place] [address]   SARS Coronavirus 2 by RT PCR (hospital order, performed in [facility] hospital lab) Nasopharyngeal Nasopharyngeal Swab     Status: None   Collection Time: 02/20/20  8:12 AM   Specimen: Nasopharyngeal Swab  Result Value Ref Range   SARS Coronavirus 2 NEGATIVE NEGATIVE    Comment: (NOTE) SARS-CoV-2 target nucleic  acids are NOT DETECTED. The SARS-CoV-2 RNA is generally detectable in upper and lower respiratory specimens during the acute phase of infection. The lowest concentration of SARS-CoV-2 viral copies this assay can detect is 250 copies / mL. A negative result does not preclude SARS-CoV-2 infection and should not be used as the sole basis for treatment or other patient management decisions.  A negative result may occur with improper specimen collection / handling, submission of specimen other than nasopharyngeal swab, presence of viral mutation(s) within the areas targeted by this assay, and inadequate number of viral copies (<250 copies / mL). A negative result must be combined with clinical observations, patient history, and epidemiological  information. Fact Sheet for Patients:   StrictlyIdeas.no Fact Sheet for Healthcare Providers: BankingDealers.co.za This test is not yet approved or cleared  by the [place] FDA and has been authorized for detection and/or diagnosis of SARS-CoV-2 by FDA under an Emergency Use Authorization (EUA).  This EUA will remain in effect (meaning this test can be used) for the duration of the COVID-19 declaration under Section 564(b)(1) of the Act, 21 U.S.C. section 360bbb-3(b)(1), unless the authorization is terminated or revoked sooner. Performed at [facility], [address], [place], [place] [address]     Blood Alcohol level:  Lab Results  Component Value Date   ETH <10 02/19/2020   ETH <10 11/15/2018    Physical Findings: AIMS:  , ,  ,  ,    CIWA:    COWS:     Musculoskeletal: Strength & Muscle Tone: within normal limits Gait & Station: normal Patient leans: N/A  Psychiatric Specialty Exam: Physical Exam Vitals and nursing note reviewed.  Constitutional:      Appearance: He is well-developed.  HENT:     Head: Normocephalic.  Cardiovascular:     Rate and Rhythm: Normal rate.  Pulmonary:     Effort: Pulmonary effort is normal.  Neurological:     Mental Status: He is alert and oriented to person, place, and time.  Psychiatric:        Attention and Perception: Attention normal.        Mood and Affect: Mood is anxious.        Speech: Speech is tangential.        Behavior: Behavior is cooperative.        Thought Content: Thought content is paranoid.        Cognition and Memory: Cognition normal.        Judgment: Judgment is impulsive.     Review of Systems  Constitutional: Negative.   HENT: Negative.   Eyes: Negative.   Respiratory: Negative.   Cardiovascular: Negative.   Gastrointestinal: Negative.   Genitourinary: Negative.   Musculoskeletal: Negative.   Skin: Negative.   Neurological:  Negative.   Psychiatric/Behavioral: The patient is nervous/anxious.     Blood pressure 128/83, pulse (!) 59, temperature 98.4 F (36.9 C), temperature source Oral, resp. rate 18, SpO2 98 %.There is no height or weight on file to calculate BMI.  General Appearance: Casual and Fairly Groomed  Eye Contact:  Good  Speech:  Clear and Coherent and Normal Rate  Volume:  Normal  Mood:  Anxious  Affect:  Non-Congruent  Thought Process:  Coherent, Goal Directed and Descriptions of Associations: Tangential  Orientation:  Full (Time, Place, and Person)  Thought Content:  Tangential  Suicidal Thoughts:  No  Homicidal Thoughts:  No  Memory:  Immediate;   Good Recent;   Good Remote;  Good  Judgement:  Impaired  Insight:  Lacking  Psychomotor Activity:  Normal  Concentration:  Concentration: Fair and Attention Span: Fair  Recall:  Good  Fund of Knowledge:  Good  Language:  Good  Akathisia:  No  Handed:  Right  AIMS (if indicated):     Assets:  Communication Skills Desire for Improvement Financial Resources/Insurance Housing Intimacy Leisure Time Physical Health Resilience Social Support  ADL's:  Intact  Cognition:  WNL  Sleep:         Treatment Plan Summary: Patient discussed with Dr Lucianne Muss. Home medications restarted. Patient currently under involuntary commitment.  Plan inpatient psychiatric treatment  Patrcia Dolly, FNP 02/20/2020, 1:33 PM

## 2020-02-21 DIAGNOSIS — F200 Paranoid schizophrenia: Secondary | ICD-10-CM | POA: Diagnosis not present

## 2020-02-21 NOTE — Consult Note (Addendum)
[facility] Face-to-Face Psychiatry Consult   Reason for Consult:  Psychosis  Referring Physician:  EDP Patient Identification: [name] MRN:  [id number] Principal Diagnosis: Schizophrenia, paranoid type ([place]) Diagnosis:  Principal Problem:   Schizophrenia, paranoid type ([place]) Active Problems:   Cannabis use disorder, moderate, dependence ([place])   Total Time spent with patient: 30 minutes  Subjective:   [name] is a 29 y.o. male patient admitted with psychosis.  Had the home machine to just run the pads around patient seen and evaluated in person by this provider.  He denies all symptoms despite staring and responding to internal stimuli on assessment.  Slightly irritable and requesting to leave.  He does have a guardian per chart and she was notified for collateral information.  She reports that he has not been taking medications "like he should be" and has been disappearing at times with paranoia and increase in aggression.  He was on a long-term injectable and this worked well for him, Charity fundraiser.  He is agreeable to restart medications and guardian is agreeable for him to be admitted which is her preference.  HPI per TTS:  [name] is an 29 y.o. male with history of Schizophrenia. Patient presents to [facility], voluntarily. States that a nurse brought him to [facility]. The nurse's identify is unknown. However, [facility] staff noted that the nurse patient was brought in by "nurse that he met at a store". Nurse left before questioned by this RN. Registration was told by male "nurse" that pt is paranoid, SI and wanting to harm others.  Upon assessing patient he was unable to explain the reason or need to present to [facility]. He answered most questions asked, "I don't know, I don't know". The more questions asked the more he appeared to became agitated and/or irritable. Patient did express that he needs help but was unable to provide any further details. He appeared to lack decision making skills and needed  guidance in answering questions appropriately.   Patient asked if he was experiencing suicidal ideations. He initially states, "I don't know, I don't know, I don't want to answer any questions". He later responded, "No, No, I don't want to hurt myself".  Patient also denied a history of self mutilating behaviors. Patient denied access to means. Denies family history of mental health illness. He was asked about stressors and states, "Everything, I don't know, I don't know". He was asked about appetite and sleep but responding, "I don't know".   Denies HI. He is unsure if he has a history of violent and/or aggressive behaviors. He did not confirm or deny a history of legal issues. He denied AVH's. However, appears paranoid and their may be some question of delusional behaviors as noted.   Patient did not respond when asked about history of substance use. However, previous notes indicate that their may be a history of marijuana use.   Patient is not oriented to time, person, place, and situation. He had difficulty providing his name. He asked this clinician where he was currently at and why he was at [facility]. Speech is pressured and times. Mood is irritable and agitated. Affect is irritable. Patient's judgement and insight are both poor. Impulse control is poor.   Patient asked if their is anyone to call to obtain collateral information. Patient states, "No, do not call anyone" and "If you need information you get it from me".   Past Psychiatric History: schizophrenia, cannabis use disorder  Risk to Self: Suicidal Ideation: No Suicidal Intent: No Is  patient at risk for suicide?: No, but patient needs Medical Clearance Suicidal Plan?: No Access to Means: No What has been your use of drugs/alcohol within the last 12 months?: (patient denies ) How many times?: (0) Other Self Harm Risks: (n/a) Triggers for Past Attempts: None known Intentional Self Injurious Behavior: None Risk to Others:  Homicidal Ideation: No Thoughts of Harm to Others: No Current Homicidal Intent: No Current Homicidal Plan: No Access to Homicidal Means: No Identified Victim: (no ) History of harm to others?: No Assessment of Violence: None Noted Violent Behavior Description: (currently calm and cooperative ) Does patient have access to weapons?: No Criminal Charges Pending?: No Does patient have a court date: No Prior Inpatient Therapy: Prior Inpatient Therapy: Yes Prior Therapy Dates: 02/2018, multiple admits Prior Therapy Facilty/Provider(s): [facility] Reason for Treatment: Schizophrenia Prior Outpatient Therapy: Prior Outpatient Therapy: Yes Prior Therapy Dates: Current Prior Therapy Facilty/Provider(s): [facility] Reason for Treatment: schizophrenia Does patient have an ACCT team?: No Does patient have Intensive In-House Services?  : No Does patient have [facility] services? : Yes Does patient have P4CC services?: No  Past Medical History:  Past Medical History:  Diagnosis Date  . Depression   . Schizophrenia [facility])     Past Surgical History:  Procedure Laterality Date  . BACK SURGERY     Family History: No family history on file. Family Psychiatric  History: none Social History:  Social History   Substance and Sexual Activity  Alcohol Use No     Social History   Substance and Sexual Activity  Drug Use Yes  . Types: Marijuana   Comment: rare    Social History   Socioeconomic History  . Marital status: Single    Spouse name: Not on file  . Number of children: Not on file  . Years of education: Not on file  . Highest education level: Not on file  Occupational History  . Occupation: unemployed  Tobacco Use  . Smoking status: Current Some Day Smoker    Types: Cigarettes  . Smokeless tobacco: Never Used  Substance and Sexual Activity  . Alcohol use: No  . Drug use: Yes    Types: Marijuana    Comment: rare  . Sexual activity: Yes    Birth control/protection: Condom   Other Topics Concern  . Not on file  Social History Narrative   UTA certain topics - tangential thought process, cannot answer clearly. Pt not sure about school; reportedly is homeless.    Social Determinants of Health   Financial Resource Strain:   . Difficulty of Paying Living Expenses:   Food Insecurity:   . Worried About Charity fundraiser in the Last Year:   . Arboriculturist in the Last Year:   Transportation Needs:   . Film/video editor (Medical):   Marland Kitchen Lack of Transportation (Non-Medical):   Physical Activity:   . Days of Exercise per Week:   . Minutes of Exercise per Session:   Stress:   . Feeling of Stress :   Social Connections:   . Frequency of Communication with Friends and Family:   . Frequency of Social Gatherings with Friends and Family:   . Attends Religious Services:   . Active Member of Clubs or Organizations:   . Attends Archivist Meetings:   Marland Kitchen Marital Status:    Additional Social History:    Allergies:   Allergies  Allergen Reactions  . Shellfish Allergy Hives and Itching    seafood  Labs:  Results for orders placed or performed during the hospital encounter of 02/19/20 (from the past 48 hour(s))  Acetaminophen level     Status: Abnormal   Collection Time: 02/19/20  8:02 PM  Result Value Ref Range   Acetaminophen (Tylenol), Serum <10 (L) 10 - 30 ug/mL    Comment: (NOTE) Therapeutic concentrations vary significantly. A range of 10-30 ug/mL  may be an effective concentration for many patients. However, some  are best treated at concentrations outside of this range. Acetaminophen concentrations >150 ug/mL at 4 hours after ingestion  and >50 ug/mL at 12 hours after ingestion are often associated with  toxic reactions. Performed at [facility], [place] [address], [place], [place] [address]   Comprehensive metabolic panel     Status: Abnormal   Collection Time: 02/19/20  8:02 PM  Result Value Ref Range    Sodium 139 135 - 145 mmol/L   Potassium 4.1 3.5 - 5.1 mmol/L   Chloride 103 98 - 111 mmol/L   CO2 28 22 - 32 mmol/L   Glucose, Bld 84 70 - 99 mg/dL    Comment: Glucose reference range applies only to samples taken after fasting for at least 8 hours.   BUN 15 6 - 20 mg/dL   Creatinine, Ser 1.04 0.61 - 1.24 mg/dL   Calcium 9.2 8.9 - 10.3 mg/dL   Total Protein 6.4 (L) 6.5 - 8.1 g/dL   Albumin 4.0 3.5 - 5.0 g/dL   AST 18 15 - 41 U/L   ALT 13 0 - 44 U/L   Alkaline Phosphatase 36 (L) 38 - 126 U/L   Total Bilirubin 0.8 0.3 - 1.2 mg/dL   GFR calc non Af Amer >60 >60 mL/min   GFR calc Af Amer >60 >60 mL/min   Anion gap 8 5 - 15    Comment: Performed at [facility], [place] [address] [place], [place] [address]  Ethanol     Status: None   Collection Time: 02/19/20  8:02 PM  Result Value Ref Range   Alcohol, Ethyl (B) <10 <10 mg/dL    Comment: (NOTE) Lowest detectable limit for serum alcohol is 10 mg/dL. For medical purposes only. Performed at [facility], [place] [address] [place],  [address]   CBC with Differential     Status: Abnormal   Collection Time: 02/19/20  8:02 PM  Result Value Ref Range   WBC 5.5 4.0 - 10.5 K/uL   RBC 4.45 4.22 - 5.81 MIL/uL   Hemoglobin 13.9 13.0 - 17.0 g/dL   HCT 40.5 39.0 - 52.0 %   MCV 91.0 80.0 - 100.0 fL   MCH 31.2 26.0 - 34.0 pg   MCHC 34.3 30.0 - 36.0 g/dL   RDW 13.0 11.5 - 15.5 %   Platelets 204 150 - 400 K/uL   nRBC 0.0 0.0 - 0.2 %   Neutrophils Relative % 29 %   Neutro Abs 1.6 (L) 1.7 - 7.7 K/uL   Lymphocytes Relative 54 %   Lymphs Abs 3.0 0.7 - 4.0 K/uL   Monocytes Relative 7 %   Monocytes Absolute 0.4 0.1 - 1.0 K/uL   Eosinophils Relative 9 %   Eosinophils Absolute 0.5 0.0 - 0.5 K/uL   Basophils Relative 1 %   Basophils Absolute 0.0 0.0 - 0.1 K/uL   Immature Granulocytes 0 %   Abs Immature Granulocytes 0.01 0.00 - 0.07 K/uL    Comment: Performed at [facility], [address] [place], [place] [address]  Urinalysis, Routine w reflex microscopic     Status: None   Collection Time: 02/20/20  8:06 AM  Result Value Ref Range   Color, Urine YELLOW YELLOW   APPearance CLEAR CLEAR   Specific Gravity, Urine 1.023 1.005 - 1.030   pH 6.0 5.0 - 8.0   Glucose, UA NEGATIVE NEGATIVE mg/dL   Hgb urine dipstick NEGATIVE NEGATIVE   Bilirubin Urine NEGATIVE NEGATIVE   Ketones, ur NEGATIVE NEGATIVE mg/dL   Protein, ur NEGATIVE NEGATIVE mg/dL   Nitrite NEGATIVE NEGATIVE   Leukocytes,Ua NEGATIVE NEGATIVE    Comment: Performed at [place] [address] [place], [place] [address]  Urine rapid drug screen (hosp performed)     Status: Abnormal   Collection Time: 02/20/20  8:06 AM  Result Value Ref Range   Opiates NONE DETECTED NONE DETECTED   Cocaine NONE DETECTED NONE DETECTED   Benzodiazepines NONE DETECTED NONE DETECTED   Amphetamines NONE DETECTED NONE DETECTED   Tetrahydrocannabinol POSITIVE (A) NONE DETECTED   Barbiturates NONE DETECTED NONE DETECTED    Comment: (NOTE) DRUG SCREEN FOR MEDICAL PURPOSES ONLY.  IF CONFIRMATION IS NEEDED FOR ANY PURPOSE, NOTIFY LAB WITHIN 5 DAYS. LOWEST DETECTABLE LIMITS FOR URINE DRUG SCREEN Drug Class                     Cutoff (ng/mL) Amphetamine and metabolites    1000 Barbiturate and metabolites    200 Benzodiazepine                 562 Tricyclics and metabolites     300 Opiates and metabolites        300 Cocaine and metabolites        300 THC                            50 Performed at [facility], [place] [address], [place], [place] [address]   SARS Coronavirus 2 by RT PCR (hospital order, performed in [facility] hospital lab) Nasopharyngeal Nasopharyngeal Swab     Status: None   Collection Time: 02/20/20  8:12 AM   Specimen: Nasopharyngeal Swab  Result Value Ref Range   SARS Coronavirus 2 NEGATIVE NEGATIVE    Comment: (NOTE) SARS-CoV-2 target nucleic acids are NOT  DETECTED. The SARS-CoV-2 RNA is generally detectable in upper and lower respiratory specimens during the acute phase of infection. The lowest concentration of SARS-CoV-2 viral copies this assay can detect is 250 copies / mL. A negative result does not preclude SARS-CoV-2 infection and should not be used as the sole basis for treatment or other patient management decisions.  A negative result may occur with improper specimen collection / handling, submission of specimen other than nasopharyngeal swab, presence of viral mutation(s) within the areas targeted by this assay, and inadequate number of viral copies (<250 copies / mL). A negative result must be combined with clinical observations, patient history, and epidemiological information. Fact Sheet for Patients:   StrictlyIdeas.no Fact Sheet for Healthcare Providers: BankingDealers.co.za This test is not yet approved or cleared  by the [place] FDA and has been authorized for detection and/or diagnosis of SARS-CoV-2 by FDA under an Emergency Use Authorization (EUA).  This EUA will remain in effect (meaning this test can be used) for the duration of the COVID-19 declaration under Section 564(b)(1) of the Act, 21 U.S.C. section 360bbb-3(b)(1), unless the authorization  is terminated or revoked sooner. Performed at [facility], [place] [address], [place], [place] [address]     Current Facility-Administered Medications  Medication Dose Route Frequency Provider Last Rate Last Admin  . acetaminophen (TYLENOL) tablet 650 mg  650 mg Oral Q4H PRN Charlesetta Shanks, MD      . alum & mag hydroxide-simeth (MAALOX/MYLANTA) 200-200-20 MG/5ML suspension 30 mL  30 mL Oral Q6H PRN Charlesetta Shanks, MD      . benztropine (COGENTIN) tablet 1 mg  1 mg Oral BID Charlesetta Shanks, MD   1 mg at 02/21/20 1005  . risperiDONE (RISPERDAL) tablet 6 mg  6 mg Oral QHS Charlesetta Shanks, MD   6 mg at  02/20/20 2110  . traZODone (DESYREL) tablet 100 mg  100 mg Oral QHS PRN Charlesetta Shanks, MD      . ziprasidone (GEODON) capsule 20 mg  20 mg Oral Once Charlesetta Shanks, MD       Current Outpatient Medications  Medication Sig Dispense Refill  . benztropine (COGENTIN) 1 MG tablet Take 1 tablet (1 mg total) by mouth 2 (two) times daily. 60 tablet 2  . paliperidone (INVEGA SUSTENNA) 234 MG/1.5ML SUSY injection Inject 234 mg into the muscle once for 1 dose. Due 4/25 (Patient not taking: Reported on 02/19/2020) 1.5 mL 11  . risperiDONE (RISPERDAL) 3 MG tablet Take 2 tablets (6 mg total) by mouth at bedtime. 60 tablet 3  . traZODone (DESYREL) 100 MG tablet Take 1 tablet (100 mg total) by mouth at bedtime as needed for sleep. 90 tablet 1    Musculoskeletal: Strength & Muscle Tone: within normal limits Gait & Station: normal Patient leans: N/A  Psychiatric Specialty Exam: Physical Exam  Nursing note and vitals reviewed. Constitutional: He is oriented to person, place, and time. He appears well-developed and well-nourished.  HENT:  Head: Normocephalic.  Musculoskeletal:        General: Normal range of motion.     Cervical back: Normal range of motion.  Neurological: He is alert and oriented to person, place, and time.  Psychiatric: His speech is normal. Judgment normal. His mood appears anxious. His affect is blunt. He is actively hallucinating. Thought content is paranoid. Cognition and memory are impaired.    Review of Systems  Psychiatric/Behavioral: Positive for hallucinations. The patient is nervous/anxious.   All other systems reviewed and are negative.   Blood pressure 120/74, pulse (!) 53, temperature 98.4 F (36.9 C), temperature source Oral, resp. rate 18, SpO2 100 %.There is no height or weight on file to calculate BMI.  General Appearance: Casual  Eye Contact:  stares  Speech:  Normal Rate  Volume:  Normal  Mood:  Anxious  Affect:  Flat  Thought Process:  Coherent and  Descriptions of Associations: Intact  Orientation:  Full (Time, Place, and Person)  Thought Content:  Delusions and Hallucinations: Auditory  Suicidal Thoughts:  No  Homicidal Thoughts:  No  Memory:  Immediate;   Fair Recent;   Fair Remote;   Fair  Judgement:  Impaired  Insight:  Lacking  Psychomotor Activity:  Normal  Concentration:  Concentration: Fair and Attention Span: Fair  Recall:  [employer] of Knowledge:  Fair  Language:  Good  Akathisia:  No  Handed:  Right  AIMS (if indicated):     Assets:  Housing Leisure Time Physical Health Resilience Social Support  ADL's:  Intact  Cognition:  WNL  Sleep:        Treatment Plan Summary:  Daily contact with patient to assess and evaluate symptoms and progress in treatment, Medication management and Plan schizophrenia, paranoid type:  -Restarted Risperdal 6 mg at bedtime -Recommend inpatient psychiatric hospitalization for stabilization with long-term antipsychotic injectable initiation.  EPS: -Cogentin 1 mg twice daily  Insomnia: -Trazodone 100 mg as needed  Disposition: Recommend psychiatric Inpatient admission when medically cleared.  Waylan Boga, NP 02/21/2020 5:31 PM  Patient seen face-to-face for psychiatric evaluation, chart reviewed and case discussed with the physician extender and developed treatment plan. Reviewed the information documented and agree with the treatment plan. Corena Pilgrim, MD

## 2020-02-21 NOTE — Progress Notes (Signed)
Received [name] at the change of shift asleep in his bed with the sitter at the bedside. He woke up and received a snack, afterwards he was compliant with his medications. He remains disorganized and unable to explain why he needed to come to the hospital. He denied feeling suicidal at the present time. He had a brief interruption in his sleep last night related to using the bathroom.

## 2020-02-22 ENCOUNTER — Inpatient Hospital Stay (HOSPITAL_COMMUNITY)
Admission: AD | Admit: 2020-02-22 | Discharge: 2020-02-24 | DRG: 885 | Disposition: A | Discharge status: Home / Self Care | Payer: Medicaid Other | Source: Other Acute Inpatient Hospital | Attending: Psychiatry | Admitting: Psychiatry

## 2020-02-22 ENCOUNTER — Other Ambulatory Visit: Payer: Self-pay

## 2020-02-22 ENCOUNTER — Encounter (HOSPITAL_COMMUNITY): Payer: Self-pay | Admitting: Psychiatry

## 2020-02-22 DIAGNOSIS — Z560 Unemployment, unspecified: Secondary | ICD-10-CM

## 2020-02-22 DIAGNOSIS — Z9114 Patient's other noncompliance with medication regimen: Secondary | ICD-10-CM

## 2020-02-22 DIAGNOSIS — Z79899 Other long term (current) drug therapy: Secondary | ICD-10-CM | POA: Diagnosis not present

## 2020-02-22 DIAGNOSIS — F1290 Cannabis use, unspecified, uncomplicated: Secondary | ICD-10-CM | POA: Diagnosis present

## 2020-02-22 DIAGNOSIS — F2089 Other schizophrenia: Secondary | ICD-10-CM | POA: Diagnosis present

## 2020-02-22 DIAGNOSIS — G4700 Insomnia, unspecified: Secondary | ICD-10-CM | POA: Diagnosis present

## 2020-02-22 DIAGNOSIS — F329 Major depressive disorder, single episode, unspecified: Secondary | ICD-10-CM | POA: Diagnosis present

## 2020-02-22 DIAGNOSIS — F200 Paranoid schizophrenia: Principal | ICD-10-CM | POA: Diagnosis present

## 2020-02-22 DIAGNOSIS — Z79891 Long term (current) use of opiate analgesic: Secondary | ICD-10-CM

## 2020-02-22 DIAGNOSIS — F17210 Nicotine dependence, cigarettes, uncomplicated: Secondary | ICD-10-CM | POA: Diagnosis present

## 2020-02-22 DIAGNOSIS — Z91013 Allergy to seafood: Secondary | ICD-10-CM

## 2020-02-22 DIAGNOSIS — Z9119 Patient's noncompliance with other medical treatment and regimen: Secondary | ICD-10-CM | POA: Diagnosis not present

## 2020-02-22 MED ORDER — PALIPERIDONE PALMITATE ER 234 MG/1.5ML IM SUSY
234.0000 mg | PREFILLED_SYRINGE | Freq: Once | INTRAMUSCULAR | Status: AC
  Administered 2020-02-22: 234 mg via INTRAMUSCULAR
  Filled 2020-02-22: qty 1.5

## 2020-02-22 MED ORDER — ACETAMINOPHEN 325 MG PO TABS: 650.0000 mg | ORAL_TABLET | ORAL | Status: DC | PRN

## 2020-02-22 MED ORDER — MAGNESIUM HYDROXIDE 400 MG/5ML PO SUSP: 30.0000 mL | Freq: Every day | ORAL | Status: DC | PRN

## 2020-02-22 MED ORDER — RISPERIDONE 3 MG PO TABS: 6.0000 mg | ORAL_TABLET | Freq: Every day | ORAL | Status: DC

## 2020-02-22 MED ORDER — ALUM & MAG HYDROXIDE-SIMETH 200-200-20 MG/5ML PO SUSP: 30.0000 mL | Freq: Four times a day (QID) | ORAL | Status: DC | PRN

## 2020-02-22 MED ORDER — TRAZODONE HCL 100 MG PO TABS: 100.0000 mg | ORAL_TABLET | Freq: Every evening | ORAL | Status: DC | PRN

## 2020-02-22 MED ORDER — PALIPERIDONE ER 6 MG PO TB24
6.0000 mg | ORAL_TABLET | Freq: Every day | ORAL | Status: DC
  Administered 2020-02-22 – 2020-02-23 (×2): 6 mg via ORAL
  Filled 2020-02-22 (×4): qty 1

## 2020-02-22 MED ORDER — ACETAMINOPHEN 325 MG PO TABS
650.0000 mg | ORAL_TABLET | Freq: Four times a day (QID) | ORAL | Status: DC | PRN
Start: 2020-02-22 — End: 2020-02-22

## 2020-02-22 MED ORDER — BENZTROPINE MESYLATE 1 MG PO TABS
1.0000 mg | ORAL_TABLET | Freq: Two times a day (BID) | ORAL | Status: DC
  Administered 2020-02-22 – 2020-02-24 (×4): 1 mg via ORAL
  Filled 2020-02-22 (×8): qty 1

## 2020-02-22 MED ORDER — ALUM & MAG HYDROXIDE-SIMETH 200-200-20 MG/5ML PO SUSP: 30.0000 mL | ORAL | Status: DC | PRN
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
## 2020-02-22 NOTE — BHH Suicide Risk Assessment (Signed)
[facility] Admission Suicide Risk Assessment   Nursing information obtained from:    Demographic factors:    Current Mental Status:    Loss Factors:    Historical Factors:    Risk Reduction Factors:     Total Time spent with patient: 30 minutes Principal Problem: <principal problem not specified> Diagnosis:  Active Problems:   Schizophrenia, paranoid type (HCC)  Subjective Data: Patient is seen and examined.  Patient is a 29 year old male with a past psychiatric history significant for schizophrenia who was brought by a nurse to the [facility] emergency department.  Patient stated it was a nurse who spoken to his mother.  During the interview today the only answer to questions was "I do not know, I do not know".  He denied auditory or visual hallucinations.  He denied suicidal or homicidal ideation.  He stated he was not sure how long he had not had his medications.  He stated he did not believe that he had followed up with psychiatry.  He was unsure when he had gotten his last long-acting Invega injection.  He told the folks in the emergency department that they could not speak to his mother, but I asked him if I could speak to her and he agreed to that.  He does have a guardian.  Apparently he lives with his aunt, but she is not his guardian.  We discussed the long-acting paliperidone injection, and he agreed to get back on that medicine today.  He was admitted to the hospital for evaluation and stabilization.  His last psychiatric hospitalization was on 01/05/2020.  He had been brought in by law enforcement because of acting bizarrely and endorsing paranoid thinking.  He was discharged on the long-acting paliperidone injection and oral paliperidone.  He was seen in the emergency department on 01/09/2020.  This again was secondary to noncompliance with medications, but his behavior was fine at that time.  He was not held at that time.  Continued Clinical Symptoms:    The "Alcohol Use  Disorders Identification Test", Guidelines for Use in Primary Care, Second Edition.  World Science writer [facility]). Score between 0-7:  no or low risk or alcohol related problems. Score between 8-15:  moderate risk of alcohol related problems. Score between 16-19:  high risk of alcohol related problems. Score 20 or above:  warrants further diagnostic evaluation for alcohol dependence and treatment.   CLINICAL FACTORS:   Schizophrenia:   Less than 30 years old Paranoid or undifferentiated type   Musculoskeletal: Strength & Muscle Tone: within normal limits Gait & Station: normal Patient leans: N/A  Psychiatric Specialty Exam: Physical Exam  Nursing note and vitals reviewed. Constitutional: He is oriented to person, place, and time. He appears well-developed and well-nourished.  HENT:  Head: Normocephalic and atraumatic.  Respiratory: Effort normal.  Neurological: He is alert and oriented to person, place, and time.    Review of Systems  Blood pressure (!) 128/94, pulse 100, temperature 98.1 F (36.7 C), temperature source Oral, SpO2 100 %.There is no height or weight on file to calculate BMI.  General Appearance: Casual  Eye Contact:  Fair  Speech:  Normal Rate  Volume:  Normal  Mood:  Dysphoric  Affect:  Constricted  Thought Process:  Goal Directed and Descriptions of Associations: Circumstantial  Orientation:  Negative  Thought Content:  Paranoid Ideation  Suicidal Thoughts:  No  Homicidal Thoughts:  No  Memory:  Immediate;   Poor Recent;   Poor Remote;  Poor  Judgement:  Impaired  Insight:  Fair  Psychomotor Activity:  Normal  Concentration:  Concentration: Fair and Attention Span: Fair  Recall:  [employer] of Knowledge:  Fair  Language:  Fair  Akathisia:  Negative  Handed:  Right  AIMS (if indicated):     Assets:  Desire for Improvement Resilience  ADL's:  Intact  Cognition:  WNL  Sleep:         COGNITIVE FEATURES THAT CONTRIBUTE TO RISK:   Closed-mindedness    SUICIDE RISK:   Mild:  Suicidal ideation of limited frequency, intensity, duration, and specificity.  There are no identifiable plans, no associated intent, mild dysphoria and related symptoms, good self-control (both objective and subjective assessment), few other risk factors, and identifiable protective factors, including available and accessible social support.  PLAN OF CARE: Patient is seen and examined.  Patient is a 29 year old male with the above-stated past psychiatric history who was admitted secondary to paranoia and noncompliance with medications.  He will be admitted to the hospital.  He will be integrated into the milieu.  He will be encouraged to attend groups.  We will restart the paliperidone oral 6 mg p.o. nightly for psychosis.  He has agreed today to take the paliperidone long-acting injection 234 mg x 1.  The last time he had the injection was somewhere around March during his hospitalization.  He also be placed on Cogentin for possible side effects of medications.  We will also write for trazodone 100 mg p.o. nightly as needed insomnia.  We will contact his guardian with regard to these medicines.  Review of his laboratories revealed essentially normal electrolytes.  His CBC was normal.  Urinalysis was normal.  His blood alcohol was less than 10.  His drug screen was positive for marijuana.  His vital signs are stable, and he is afebrile.  I certify that inpatient services furnished can reasonably be expected to improve the patient's condition.   Sharma Covert, MD 02/22/2020, 1:50 PM

## 2020-02-22 NOTE — Progress Notes (Signed)
Patient accepted to [facility] [phone], please call report.  Nanine Means, PMHNP

## 2020-02-22 NOTE — Tx Team (Signed)
Initial Treatment Plan 02/22/2020 3:02 PM Urho Rio [id number]:[id number]    PATIENT STRESSORS: Other: mental illness   PATIENT STRENGTHS: Barrister's clerk for treatment/growth Physical Health Supportive family/friends   PATIENT IDENTIFIED PROBLEMS:        "needed help"     (depression)     (paranoia)         DISCHARGE CRITERIA:  Ability to meet basic life and health needs Adequate post-discharge living arrangements Improved stabilization in mood, thinking, and/or behavior Verbal commitment to aftercare and medication compliance  PRELIMINARY DISCHARGE PLAN: Outpatient therapy Return to previous living arrangement  PATIENT/FAMILY INVOLVEMENT: This treatment plan has been presented to and reviewed with the patient, [name],.  The patient has been given the opportunity to ask questions and make suggestions.  Shela Nevin, RN 02/22/2020, 3:02 PM

## 2020-02-22 NOTE — ED Notes (Signed)
Pt alert, cooperative, no s/s of distress. Pt cooperative with medication.  Pt pacing in room. Pt has expressed several times wants to go home.

## 2020-02-22 NOTE — Progress Notes (Signed)
Pt accepted to the service of MD [facility] at [facility], Room 506-01.  Report may be called to [phone] - the adult unit.

## 2020-02-22 NOTE — H&P (Signed)
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  Patient is a 29 year old male with a past psychiatric history significant for schizophrenia who was brought by a nurse to the [facility] emergency department.  Patient stated it was a nurse who spoken to his mother.  During the interview today the only answer to questions was "I do not know, I do not know".  He denied auditory or visual hallucinations.  He denied suicidal or homicidal ideation.  He stated he was not sure how long he had not had his medications.  He stated he did not believe that he had followed up with psychiatry.  He was unsure when he had gotten his last long-acting Invega injection.  He told the folks in the emergency department that they could not speak to his mother, but I asked him if I could speak to her and he agreed to that.  He does have a guardian.  Apparently he lives with his aunt, but she is not his guardian.  We discussed the long-acting paliperidone injection, and he agreed to get back on that medicine today.  He was admitted to the hospital for evaluation and stabilization.  His last psychiatric hospitalization was on 01/05/2020.  He had been brought in by law enforcement because of acting bizarrely and endorsing paranoid thinking.  He was discharged on the long-acting paliperidone injection and oral paliperidone.  He was seen in the emergency department on 01/09/2020.  This again was secondary to noncompliance with medications, but his behavior was fine at that time.  He was not held at that time.  In the past he has had auditory and visual hallucinations.  Associated  Signs/Symptoms: Depression Symptoms:  anhedonia, insomnia, disturbed sleep, (Hypo) Manic Symptoms:  Delusions, Anxiety Symptoms:  Excessive Worry, Psychotic Symptoms:  Delusions, Paranoia, PTSD Symptoms: Negative Total Time spent with patient: 30 minutes  Past Psychiatric History: Patient's last admission to our facility was in 01/05/2020.  Prior to that was a hospitalization here on 02/14/2018.  He has had previous psychiatric admissions to [facility] in 2017 as well.  He has had follow-up set up with [facility] as an outpatient, but he is usually noncompliant.  He has received the paliperidone long-acting injection as far back as 2019.  Is the patient at risk to self? Yes.    Has the patient been a risk to self in the past 6 months? Yes.    Has the patient been a risk to self within the distant past? Yes.    Is the patient a risk to others? Yes.    Has the patient been a risk to others in the past 6 months? Yes.    Has the patient been a risk to others within the distant past? Yes.     Prior Inpatient Therapy:   Prior Outpatient Therapy:    Alcohol Screening:   Substance Abuse History in the last 12 months:  Yes.   Consequences of Substance Abuse: Medical Consequences:  Clearly could worsen his psychotic symptoms. Previous Psychotropic Medications: Yes  Psychological Evaluations: Yes  Past Medical History:  Past Medical History:  Diagnosis Date  . Depression   . Schizophrenia [facility])     Past Surgical History:  Procedure Laterality  Date  . BACK SURGERY     Family History: No family history on file. Family Psychiatric  History: Unable to obtain full history. Tobacco Screening:   Social History:  Social History   Substance and Sexual Activity  Alcohol Use No     Social History   Substance and Sexual Activity  Drug Use Yes  . Types: Marijuana   Comment: rare    Additional Social History:                           Allergies:    Allergies  Allergen Reactions  . Shellfish Allergy Hives and Itching    seafood   Lab Results: No results found for this or any previous visit (from the past 48 hour(s)).  Blood Alcohol level:  Lab Results  Component Value Date   ETH <10 02/19/2020   ETH <10 11/15/2018    Metabolic Disorder Labs:  Lab Results  Component Value Date   HGBA1C 5.2 05/12/2016   Lab Results  Component Value Date   PROLACTIN 79.4 (H) 02/15/2018   PROLACTIN 30.5 (H) 05/12/2016   Lab Results  Component Value Date   CHOL 176 05/12/2016   TRIG 312 (H) 05/12/2016   HDL 55 05/12/2016   CHOLHDL 3.2 05/12/2016   VLDL 62 (H) 05/12/2016   LDLCALC 59 05/12/2016    Current Medications: Current Facility-Administered Medications  Medication Dose Route Frequency Provider Last Rate Last Admin  . acetaminophen (TYLENOL) tablet 650 mg  650 mg Oral Q4H PRN Antonieta Pert, MD      . alum & mag hydroxide-simeth (MAALOX/MYLANTA) 200-200-20 MG/5ML suspension 30 mL  30 mL Oral Q6H PRN Antonieta Pert, MD      . benztropine (COGENTIN) tablet 1 mg  1 mg Oral BID Antonieta Pert, MD      . magnesium hydroxide (MILK OF MAGNESIA) suspension 30 mL  30 mL Oral Daily PRN Antonieta Pert, MD      . paliperidone (INVEGA) 24 hr tablet 6 mg  6 mg Oral QHS Antonieta Pert, MD      . traZODone (DESYREL) tablet 100 mg  100 mg Oral QHS PRN Antonieta Pert, MD       PTA Medications: Medications Prior to Admission  Medication Sig Dispense Refill Last Dose  . benztropine (COGENTIN) 1 MG tablet Take 1 tablet (1 mg total) by mouth 2 (two) times daily. 60 tablet 2   . paliperidone (INVEGA SUSTENNA) 234 MG/1.5ML SUSY injection Inject 234 mg into the muscle once for 1 dose. Due 4/25 (Patient not taking: Reported on 02/19/2020) 1.5 mL 11   . risperiDONE (RISPERDAL) 3 MG tablet Take 2 tablets (6 mg total) by mouth at bedtime. 60 tablet 3   . traZODone (DESYREL) 100 MG tablet Take 1 tablet (100 mg total) by mouth at  bedtime as needed for sleep. 90 tablet 1     Musculoskeletal: Strength & Muscle Tone: within normal limits Gait & Station: normal Patient leans: N/A  Psychiatric Specialty Exam: Physical Exam  Nursing note and vitals reviewed. Constitutional: He appears well-developed and well-nourished.  HENT:  Head: Normocephalic and atraumatic.  Respiratory: Effort normal.  Neurological: He is alert.    Review of Systems  Blood pressure (!) 128/94, pulse 100, temperature 98.1 F (36.7 C), temperature source Oral, SpO2 100 %.There is no height or weight on file to calculate BMI.  General Appearance: Casual  Eye Contact:  Fair  Speech:  Normal Rate  Volume:  Decreased  Mood:  Anxious and Dysphoric  Affect:  Congruent  Thought Process:  Goal Directed and Descriptions of Associations: Circumstantial  Orientation:  Negative  Thought Content:  Delusions  Suicidal Thoughts:  No  Homicidal Thoughts:  No  Memory:  Immediate;   Poor Recent;   Poor Remote;   Poor  Judgement:  Impaired  Insight:  Fair  Psychomotor Activity:  Decreased  Concentration:  Concentration: Fair and Attention Span: Fair  Recall:  [employer] of Knowledge:  Fair  Language:  Good  Akathisia:  Negative  Handed:  Right  AIMS (if indicated):     Assets:  Desire for Improvement Resilience  ADL's:  Intact  Cognition:  WNL  Sleep:       Treatment Plan Summary: Daily contact with patient to assess and evaluate symptoms and progress in treatment, Medication management and Plan : Patient is seen and examined.  Patient is a 29 year old male with the above-stated past psychiatric history who was admitted secondary to paranoia and noncompliance with medications.  He will be admitted to the hospital.  He will be integrated into the milieu.  He will be encouraged to attend groups.  We will restart the paliperidone oral 6 mg p.o. nightly for psychosis.  He has agreed today to take the paliperidone long-acting injection 234 mg x 1.   The last time he had the injection was somewhere around March during his hospitalization.  He also be placed on Cogentin for possible side effects of medications.  We will also write for trazodone 100 mg p.o. nightly as needed insomnia.  We will contact his guardian with regard to these medicines.  Review of his laboratories revealed essentially normal electrolytes.  His CBC was normal.  Urinalysis was normal.  His blood alcohol was less than 10.  His drug screen was positive for marijuana.  His vital signs are stable, and he is afebrile.  Observation Level/Precautions:  15 minute checks  Laboratory:  Chemistry Profile  Psychotherapy:    Medications:    Consultations:    Discharge Concerns:    Estimated LOS:  Other:     Physician Treatment Plan for Primary Diagnosis: <principal problem not specified> Long Term Goal(s): Improvement in symptoms so as ready for discharge  Short Term Goals: Ability to identify changes in lifestyle to reduce recurrence of condition will improve, Ability to verbalize feelings will improve, Ability to disclose and discuss suicidal ideas, Ability to demonstrate self-control will improve, Ability to identify and develop effective coping behaviors will improve, Ability to maintain clinical measurements within normal limits will improve, Compliance with prescribed medications will improve and Ability to identify triggers associated with substance abuse/mental health issues will improve  Physician Treatment Plan for Secondary Diagnosis: Active Problems:   Schizophrenia, paranoid type (HCC)  Long Term Goal(s): Improvement in symptoms so as ready for discharge  Short Term Goals: Ability to identify changes in lifestyle to reduce recurrence of condition will improve, Ability to verbalize feelings will improve, Ability to disclose and discuss suicidal ideas, Ability to demonstrate self-control will improve, Ability to identify and develop effective coping behaviors will  improve, Ability to maintain clinical measurements within normal limits will improve, Compliance with prescribed medications will improve and Ability to identify triggers associated with substance abuse/mental health issues will improve  I certify that inpatient services furnished can reasonably be expected to improve the patient's condition.    Antonieta Pert, MD 5/16/20211:58 PM

## 2020-02-22 NOTE — ED Notes (Signed)
Pt off unit to [facility]. Pt calm, cooperative, no s/s of distress. DC information given to [employer] for facility . Belongings given to [employer] for facility . Pt ambulatory off unit, escorted by NT and [employer]. Pt transported by [employer].

## 2020-02-23 NOTE — Progress Notes (Signed)
   02/22/20 2125  COVID-19 Daily Checkoff  Have you had a fever (temp > 37.80C/100F)  in the past 24 hours?  No  If you have had runny nose, nasal congestion, sneezing in the past 24 hours, has it worsened? No  COVID-19 EXPOSURE  Have you traveled outside the state in the past 14 days? No  Have you been in contact with someone with a confirmed diagnosis of COVID-19 or PUI in the past 14 days without wearing appropriate PPE? No  Have you been living in the same home as a person with confirmed diagnosis of COVID-19 or a PUI (household contact)? No  Have you been diagnosed with COVID-19? No

## 2020-02-23 NOTE — BHH Counselor (Signed)
Adult Comprehensive Assessment  Patient ID: [name], male   DOB: Sep 26, 1991, 29 y.o.   MRN: [id number]  Information Source: Patient     Current Stressors: Patient states their primary concerns and needs for treatment are: "I just told my nurse I wanted to come to the hospital, but I don't know why." Patient is unable to identify reason that lead him to request to be brought to the hospital.  Patient states their goals for this hospitilization and ongoing recovery are: "I just want to go home, I got to figure it out on my own"  Educational / Learning stressors: N/Adrian Employment / Job issues:Pt receives [employer] Family Relationships: "I don't mess around with people." Pt states he is currently residing with his aunt [name].  Financial / Lack of resources (include bankruptcy):Limited income Housing / Lack of housing:Reports he stays with his aunt and/or "on the streets"  Physical health (include injuries & life threatening diseases): Denies Social relationships:No social supports. Continued to say throughout assessment "people be acting funny" Has Adrian hard time trusting others. Substance abuse:Denies all substance use. Bereavement / Loss: N/Adrian  Living/Environment/Situation: Living Arrangements:Lives with aunt and/or on the streets Living conditions (as described by patient or guardian):"It's ok" How long has patient lived in current situation?:Unknown What is atmosphere in current home: Temporary  Family History: Marital status: Single Are you sexually active?: No What is your sexual orientation?: Straight  Has your sexual activity been affected by drugs, alcohol, medication, or emotional stress?: N/Adrian Does patient have children?: No  Childhood History: By whom was/is the patient raised?: Grandparents Description of patient's relationship with caregiver when they were Adrian child: "I don't know" Patient's description of current relationship with people who  raised him/her: "I don't know" How were you disciplined when you got in trouble as Adrian child/adolescent?: "Man I guess punishment" Does patient have siblings?: Yes Number of Siblings: 1 Description of patient's current relationship with siblings: Does not talk to brother Did patient suffer any verbal/emotional/physical/sexual abuse as Adrian child?: No Did patient suffer from severe childhood neglect?: No Has patient ever been sexually abused/assaulted/raped as an adolescent or adult?: No Was the patient ever Adrian victim of Adrian crime or Adrian disaster?: No Witnessed domestic violence?: No Has patient been effected by domestic violence as an adult?: No  Education: Highest grade of school patient has completed: 9th grade Currently Adrian student?: No Name of school: n/Adrian  Employment/Work Situation: Employment situation:Pt receives SSI, unknown when payments began Patient's job has been impacted by current illness: Yes Describe how patient's job has been impacted: Research scientist (medical) hold Adrian job, reported he was fired from last job at [employer] due to drinking the day before and having Adrian no call/no show.  What is the longest time patient has Adrian held Adrian job?: "For Adrian few" Pt is unable to identify length of time. Where was the patient employed at that time?: [employer] Has patient ever been in the [employer]?: No Has patient ever served in combat?: No Did You Receive Any Psychiatric Treatment/Services While in Passenger transport manager?: No Are There Guns or Other Weapons in [place]?: No Are These Hooker?: Yes (N/Adrian) Who Could Verify You Are Able To Have These Secured: N/Adrian  Financial Resources: Pt currently receiving SSI, interested in employment   Alcohol/Substance Abuse: What has been your use of drugs/alcohol within the last 12 months?:Pt denies all substance use If attempted suicide, did drugs/alcohol play Adrian role in this?: No Alcohol/Substance Abuse Treatment Hx: [facility]  twice in 2019  Social Support  System:  Patient's Community Support System: None Describe Community Support System: No supports Type of faith/religion:N/Adrian How does patient's faith help to cope with current illness?:N/Adrian  Leisure/Recreation: Leisure and Hobbies:Football, watching tv, playing games  Strengths/Needs: What things does the patient do well?:Video games, "stay to myself"  Discharge Plan: Does patient have access to transportation?: Yes, public transportation. Will patient be returning to same living situation after discharge?: Yes, plans to return to aunts residence.  Currently receiving community mental health services:No. Has received services from [place] in the past. Stated he is open to going to [facility] [place] for follow-up. Does patient have financial barriers related to discharge medications?: No, has Medicaid.  Summary/Recommendations:   [name] is Adrian 29 year old male living in [place], [place]. He denies all symptoms despite staring and responding to internal stimuli on assessment. Slightly irritable and requesting to leave. He does have Adrian guardian per chart and she was notified for collateral information. She reports that he has not been taking medications "like he should be" and has been disappearing at times with paranoia and increase in aggression. 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 He is agreeable to restart medications and guardian is agreeable for him to be admitted which is her preferenceRecommendations for patient include: crisis stabilization, therapeutic milieu, encourage group attendance and participation, medication management for mood stabilization, and development of comprehensive mental wellness plan.     [name] [name]. 02/23/2020

## 2020-02-23 NOTE — Tx Team (Addendum)
Interdisciplinary Treatment and Diagnostic Plan Update  02/23/2020 Time of Session: 9:05am [name] MRN: [id number]  Principal Diagnosis: <principal problem not specified>  Secondary Diagnoses: Active Problems:   Schizophrenia, paranoid type ([name])   Current Medications:  Current Facility-Administered Medications  Medication Dose Route Frequency Provider Last Rate Last Admin  . acetaminophen (TYLENOL) tablet 650 mg  650 mg Oral Q4H PRN Sharma Covert, MD      . alum & mag hydroxide-simeth (MAALOX/MYLANTA) 200-200-20 MG/5ML suspension 30 mL  30 mL Oral Q6H PRN Sharma Covert, MD      . benztropine (COGENTIN) tablet 1 mg  1 mg Oral BID Sharma Covert, MD   1 mg at 02/23/20 0742  . magnesium hydroxide (MILK OF MAGNESIA) suspension 30 mL  30 mL Oral Daily PRN Sharma Covert, MD      . paliperidone (INVEGA) 24 hr tablet 6 mg  6 mg Oral QHS Sharma Covert, MD   6 mg at 02/22/20 2110  . traZODone (DESYREL) tablet 100 mg  100 mg Oral QHS PRN Sharma Covert, MD       PTA Medications: Medications Prior to Admission  Medication Sig Dispense Refill Last Dose  . benztropine (COGENTIN) 1 MG tablet Take 1 tablet (1 mg total) by mouth 2 (two) times daily. 60 tablet 2   . paliperidone (INVEGA SUSTENNA) 234 MG/1.5ML SUSY injection Inject 234 mg into the muscle once for 1 dose. Due 4/25 (Patient not taking: Reported on 02/19/2020) 1.5 mL 11   . risperiDONE (RISPERDAL) 3 MG tablet Take 2 tablets (6 mg total) by mouth at bedtime. 60 tablet 3   . traZODone (DESYREL) 100 MG tablet Take 1 tablet (100 mg total) by mouth at bedtime as needed for sleep. 90 tablet 1     Patient Stressors: Other: mental illness  Patient Strengths: Agricultural engineer for treatment/growth Physical Health Supportive family/friends  Treatment Modalities: Medication Management, Group therapy, Case management,  1 to 1 session with clinician, Psychoeducation, Recreational  therapy.   Physician Treatment Plan for Primary Diagnosis: <principal problem not specified> Long Term Goal(s): Improvement in symptoms so as ready for discharge Improvement in symptoms so as ready for discharge   Short Term Goals: Ability to identify changes in lifestyle to reduce recurrence of condition will improve Ability to verbalize feelings will improve Ability to disclose and discuss suicidal ideas Ability to demonstrate self-control will improve Ability to identify and develop effective coping behaviors will improve Ability to maintain clinical measurements within normal limits will improve Compliance with prescribed medications will improve Ability to identify triggers associated with substance abuse/mental health issues will improve Ability to identify changes in lifestyle to reduce recurrence of condition will improve Ability to verbalize feelings will improve Ability to disclose and discuss suicidal ideas Ability to demonstrate self-control will improve Ability to identify and develop effective coping behaviors will improve Ability to maintain clinical measurements within normal limits will improve Compliance with prescribed medications will improve Ability to identify triggers associated with substance abuse/mental health issues will improve  Medication Management: Evaluate patient's response, side effects, and tolerance of medication regimen.  Therapeutic Interventions: 1 to 1 sessions, Unit Group sessions and Medication administration.  Evaluation of Outcomes: Not Met  Physician Treatment Plan for Secondary Diagnosis: Active Problems:   Schizophrenia, paranoid type ([name])  Long Term Goal(s): Improvement in symptoms so as ready for discharge Improvement in symptoms so as ready for discharge   Short Term Goals: Ability to identify changes in  lifestyle to reduce recurrence of condition will improve Ability to verbalize feelings will improve Ability to disclose and  discuss suicidal ideas Ability to demonstrate self-control will improve Ability to identify and develop effective coping behaviors will improve Ability to maintain clinical measurements within normal limits will improve Compliance with prescribed medications will improve Ability to identify triggers associated with substance abuse/mental health issues will improve Ability to identify changes in lifestyle to reduce recurrence of condition will improve Ability to verbalize feelings will improve Ability to disclose and discuss suicidal ideas Ability to demonstrate self-control will improve Ability to identify and develop effective coping behaviors will improve Ability to maintain clinical measurements within normal limits will improve Compliance with prescribed medications will improve Ability to identify triggers associated with substance abuse/mental health issues will improve     Medication Management: Evaluate patient's response, side effects, and tolerance of medication regimen.  Therapeutic Interventions: 1 to 1 sessions, Unit Group sessions and Medication administration.  Evaluation of Outcomes: Not Met   RN Treatment Plan for Primary Diagnosis: <principal problem not specified> Long Term Goal(s): Knowledge of disease and therapeutic regimen to maintain health will improve  Short Term Goals: Ability to verbalize feelings will improve, Ability to identify and develop effective coping behaviors will improve and Compliance with prescribed medications will improve  Medication Management: RN will administer medications as ordered by provider, will assess and evaluate patient's response and provide education to patient for prescribed medication. RN will report any adverse and/or side effects to prescribing provider.  Therapeutic Interventions: 1 on 1 counseling sessions, Psychoeducation, Medication administration, Evaluate responses to treatment, Monitor vital signs and CBGs as ordered,  Perform/monitor CIWA, COWS, AIMS and Fall Risk screenings as ordered, Perform wound care treatments as ordered.  Evaluation of Outcomes: Not Met   LCSW Treatment Plan for Primary Diagnosis: <principal problem not specified> Long Term Goal(s): Safe transition to appropriate next level of care at discharge, Engage patient in therapeutic group addressing interpersonal concerns.  Short Term Goals: Engage patient in aftercare planning with referrals and resources, Increase social support, Increase ability to appropriately verbalize feelings and Increase skills for wellness and recovery  Therapeutic Interventions: Assess for all discharge needs, 1 to 1 time with Social worker, Explore available resources and support systems, Assess for adequacy in community support network, Educate family and significant other(s) on suicide prevention, Complete Psychosocial Assessment, Interpersonal group therapy.  Evaluation of Outcomes: Not Met   Progress in Treatment: Attending groups: No. Participating in groups: No. Taking medication as prescribed: Yes. Toleration medication: Yes. Family/Significant other contact made: No, will contact:  once consent is provided  Patient understands diagnosis: Yes. Discussing patient identified problems/goals with staff: Yes. Medical problems stabilized or resolved: Yes. Denies suicidal/homicidal ideation: Yes. Issues/concerns per patient self-inventory: No.   New problem(s) identified: No, Describe:  none.  New Short Term/Long Term Goal(s):   Patient Goals:  "To get out of here, I'm ready to be discharged"   Discharge Plan or Barriers: None noted.  Reason for Continuation of Hospitalization: Aggression Hallucinations Medication stabilization  Estimated Length of Stay: 3-5 days  Attendees: Patient: [name] 02/23/2020   Physician:  02/23/2020   Nursing:  02/23/2020   RN Care Manager: 02/23/2020   Social Worker: Darletta Moll, Latanya Presser 02/23/2020    Recreational Therapist:  02/23/2020   Other: Marvia Pickles, [place] 02/23/2020   Other:  02/23/2020   Other: 02/23/2020     Scribe for Treatment Team: Vassie Moselle, LCSW 02/23/2020 9:35 AM

## 2020-02-23 NOTE — Progress Notes (Signed)
Patient at first refused his medication but later came up to take it. He reported that wanted to take it so that he can be discharged soon.    02/22/20 2125  Psych Admission Type (Psych Patients Only)  Admission Status Voluntary  Psychosocial Assessment  Patient Complaints None  Eye Contact Fair  Facial Expression Flat  Affect Flat  Speech Logical/coherent  Interaction Minimal  Motor Activity Other (Comment) (WNL)  Appearance/Hygiene Unremarkable  Aggressive Behavior  Targets Other (Comment)  Type of Behavior Provoked or triggered  Effect No apparent injury  Thought Process  Coherency WDL  Content WDL  Delusions None reported or observed;WDL  Perception Derealization  Hallucination None reported or observed  Judgment Impaired  Confusion None  Danger to Self  Current suicidal ideation? Denies  Danger to Others  Danger to Others None reported or observed  Danger to Others Abnormal  Harmful Behavior to others Threats of violence towards other people observed or expressed

## 2020-02-23 NOTE — Progress Notes (Signed)
[facility] MD Progress Note  02/23/2020 9:41 AM [name]  MRN:  [id number] Subjective:  "I want to leave."  [name] found sitting in his room. He is requesting discharge. He remains vague concerning reasons for hospitalization. He states he had been speaking with a nurse from [place] at the store and told her that he wanted to go to the hospital. He is unsure if he has an ACT team. He states "I don't know" when asked about reasons for wanting to go the hospital. Per notes from the ED, the nurse who accompanied him stated the patient was suicidal, paranoid, and having thoughts of harming others. He denies SI/HI/AVH. He denies paranoid thoughts and shows no visible signs of paranoia. He shows no signs of responding to internal stimuli. He reports good sleep overnight, with 6.75 hours recorded. He states that he had been taking Cogentin at home but is unable to name other home medications or when he last took them. He received Invega Sustenna 234 mg yesterday. Nursing reports patient has been calm and cooperative.  From admission H&P: Patient is a 29 year old male with a past psychiatric history significant for schizophrenia who was brought by a nurse to the [facility] emergency department. Patient stated it was a nurse who spoken to his mother. During the interview today the only answer to questions was "I do not know, I do not know".  Principal Problem: <principal problem not specified> Diagnosis: Active Problems:   Schizophrenia, paranoid type ([place])  Total Time spent with patient: 15 minutes  Past Psychiatric History: See admission H&P  Past Medical History:  Past Medical History:  Diagnosis Date  . Depression   . Schizophrenia [facility])     Past Surgical History:  Procedure Laterality Date  . BACK SURGERY     Family History: History reviewed. No pertinent family history. Family Psychiatric  History: See admission H&P Social History:  Social History   Substance and  Sexual Activity  Alcohol Use No     Social History   Substance and Sexual Activity  Drug Use Yes  . Types: Marijuana   Comment: rare    Social History   Socioeconomic History  . Marital status: Single    Spouse name: Not on file  . Number of children: Not on file  . Years of education: Not on file  . Highest education level: Not on file  Occupational History  . Occupation: unemployed  Tobacco Use  . Smoking status: Current Some Day Smoker    Types: Cigarettes  . Smokeless tobacco: Never Used  Substance and Sexual Activity  . Alcohol use: No  . Drug use: Yes    Types: Marijuana    Comment: rare  . Sexual activity: Yes    Birth control/protection: Condom  Other Topics Concern  . Not on file  Social History Narrative   UTA certain topics - tangential thought process, cannot answer clearly. Pt not sure about school; reportedly is homeless.    Social Determinants of Health   Financial Resource Strain:   . Difficulty of Paying Living Expenses:   Food Insecurity:   . Worried About Charity fundraiser in the Last Year:   . Arboriculturist in the Last Year:   Transportation Needs:   . Film/video editor (Medical):   Marland Kitchen Lack of Transportation (Non-Medical):   Physical Activity:   . Days of Exercise per Week:   . Minutes of Exercise per Session:   Stress:   .  Feeling of Stress :   Social Connections:   . Frequency of Communication with Friends and Family:   . Frequency of Social Gatherings with Friends and Family:   . Attends Religious Services:   . Active Member of Clubs or Organizations:   . Attends Banker Meetings:   Marland Kitchen Marital Status:    Additional Social History:                         Sleep: Good  Appetite:  Good  Current Medications: Current Facility-Administered Medications  Medication Dose Route Frequency Provider Last Rate Last Admin  . acetaminophen (TYLENOL) tablet 650 mg  650 mg Oral Q4H PRN Antonieta Pert, MD       . alum & mag hydroxide-simeth (MAALOX/MYLANTA) 200-200-20 MG/5ML suspension 30 mL  30 mL Oral Q6H PRN Antonieta Pert, MD      . benztropine (COGENTIN) tablet 1 mg  1 mg Oral BID Antonieta Pert, MD   1 mg at 02/23/20 0742  . magnesium hydroxide (MILK OF MAGNESIA) suspension 30 mL  30 mL Oral Daily PRN Antonieta Pert, MD      . paliperidone (INVEGA) 24 hr tablet 6 mg  6 mg Oral QHS Antonieta Pert, MD   6 mg at 02/22/20 2110  . traZODone (DESYREL) tablet 100 mg  100 mg Oral QHS PRN Antonieta Pert, MD        Lab Results: No results found for this or any previous visit (from the past 48 hour(s)).  Blood Alcohol level:  Lab Results  Component Value Date   ETH <10 02/19/2020   ETH <10 11/15/2018    Metabolic Disorder Labs: Lab Results  Component Value Date   HGBA1C 5.2 05/12/2016   Lab Results  Component Value Date   PROLACTIN 79.4 (H) 02/15/2018   PROLACTIN 30.5 (H) 05/12/2016   Lab Results  Component Value Date   CHOL 176 05/12/2016   TRIG 312 (H) 05/12/2016   HDL 55 05/12/2016   CHOLHDL 3.2 05/12/2016   VLDL 62 (H) 05/12/2016   LDLCALC 59 05/12/2016    Physical Findings: AIMS:  , ,  ,  ,    CIWA:    COWS:     Musculoskeletal: Strength & Muscle Tone: within normal limits Gait & Station: normal Patient leans: N/A  Psychiatric Specialty Exam: Physical Exam  Nursing note and vitals reviewed. Constitutional: He is oriented to person, place, and time. He appears well-developed and well-nourished.  Cardiovascular: Normal rate.  Respiratory: Effort normal.  Neurological: He is alert and oriented to person, place, and time.    Review of Systems  Constitutional: Negative.   Respiratory: Negative for cough and shortness of breath.   Psychiatric/Behavioral: Negative for agitation, behavioral problems, dysphoric mood, hallucinations, self-injury, sleep disturbance and suicidal ideas. The patient is not nervous/anxious and is not hyperactive.     Blood  pressure 134/87, pulse 79, temperature 97.8 F (36.6 C), temperature source Oral, resp. rate 16, height 5\' 5"  (1.651 m), weight 69.4 kg, SpO2 100 %.Body mass index is 25.46 kg/m.  General Appearance: Casual  Eye Contact:  Good  Speech:  Normal Rate  Volume:  Normal  Mood:  Euthymic  Affect:  Congruent  Thought Process:  Coherent  Orientation:  Full (Time, Place, and Person)  Thought Content:  Poverty of content  Suicidal Thoughts:  No  Homicidal Thoughts:  No  Memory:  Immediate;   Good Recent;  Good  Judgement:  Intact  Insight:  Lacking  Psychomotor Activity:  Normal  Concentration:  Concentration: Fair and Attention Span: Fair  Recall:  [employer] of Knowledge:  Fair  Language:  Good  Akathisia:  No  Handed:  Right  AIMS (if indicated):     Assets:  Communication Skills Desire for Improvement Housing Physical Health  ADL's:  Intact  Cognition:  WNL  Sleep:  Number of Hours: 6.75     Treatment Plan Summary: Daily contact with patient to assess and evaluate symptoms and progress in treatment and Medication management   Continue inpatient hospitalization.  Patient received Invega Sustenna 234 mg IM on 02/22/20 Continue Invega 6 mg PO QHS for psychosis Continue Cogentin 1 mg PO BID for EPS Continue trazodone 100 mg PO QHS PRN insomnia  Patient will participate in the therapeutic group milieu.  Discharge disposition in progress.   Aldean Baker, NP 02/23/2020, 9:41 AM

## 2020-02-23 NOTE — Progress Notes (Signed)
Recreation Therapy Notes  Date: 5.17.21 Time: 1000 Location: 500 Hall Dayroom  Group Topic: Coping Skills  Goal Area(s) Addresses:  Patients will identify positive coping strategies. Patients will identify benefit of using positive coping strategies over negative coping strategies.  Intervention: Worksheet, pencils  Activity: Healthy vs. Unhealthy Coping Strategies.  Patients were to identify a current problem they are facing, identify the unhealthy coping strategies they have used to address this problem and consequences of those coping strategies.  Patients then identified healthy coping strategies, benefits and barriers to using these strategies.  Education: Pharmacologist, Building control surveyor.   Education Outcome: Acknowledges understanding/In group clarification offered/Needs additional education.   Clinical Observations/Feedback: Pt did not attend group session.     Caroll Rancher, LRT/CTRS     Caroll Rancher A 02/23/2020 11:44 AM

## 2020-02-23 NOTE — Progress Notes (Signed)
   02/23/20 2030  Psych Admission Type (Psych Patients Only)  Admission Status Involuntary  Psychosocial Assessment  Patient Complaints None  Eye Contact Fair  Facial Expression Flat  Affect Flat  Speech Logical/coherent  Interaction Minimal  Motor Activity Other (Comment) (WNL)  Appearance/Hygiene Unremarkable;In scrubs  Behavior Characteristics Cooperative  Mood Pleasant  Thought Process  Coherency WDL  Content WDL  Delusions None reported or observed  Perception WDL  Hallucination None reported or observed  Judgment Impaired  Confusion None  Danger to Self  Current suicidal ideation? Denies  Danger to Others  Danger to Others None reported or observed   Pt was in dayroom interacting. Pt took meds without incident. Forwards little.

## 2020-02-23 NOTE — Progress Notes (Signed)
   02/23/20 1700  Psych Admission Type (Psych Patients Only)  Admission Status Involuntary  Psychosocial Assessment  Patient Complaints None  Eye Contact Fair  Facial Expression Flat  Affect Flat  Speech Logical/coherent  Interaction Minimal  Motor Activity Other (Comment) (WNL)  Appearance/Hygiene Unremarkable  Behavior Characteristics Cooperative  Mood Pleasant  Aggressive Behavior  Targets Other (Comment)  Type of Behavior Provoked or triggered  Effect No apparent injury  Thought Process  Coherency WDL  Content WDL  Delusions None reported or observed;WDL  Perception Derealization  Hallucination None reported or observed  Judgment Impaired  Confusion None  Danger to Self  Current suicidal ideation? Denies  Danger to Others  Danger to Others None reported or observed  Danger to Others Abnormal  Harmful Behavior to others Threats of violence towards other people observed or expressed

## 2020-02-23 NOTE — Progress Notes (Signed)
Recreation Therapy Notes  Patient admitted to unit 5.16.21. Due to admission within last year, no new assessment conducted at this time. Last assessment conducted 3.30.21. Patient could not give a reason for current admission.  Patient reports no stressors for current admission.  Patient identified music as current coping skill.  Patient expressed fishing was a leisure interest but he doesn't go often.  Patient could not identify strengths and expressed no areas of improvement.   Patient denies SI, HI, AVH at this time. Patient reports goal of staying positive, maintain, keeping head up and being good.      Caroll Rancher, LRT/CTRS   Caroll Rancher A 02/23/2020 12:18 PM

## 2020-02-24 MED ORDER — PALIPERIDONE ER 6 MG PO TB24: 6.0000 mg | ORAL_TABLET | Freq: Every day | ORAL | 0 refills | Status: DC

## 2020-02-24 MED ORDER — BENZTROPINE MESYLATE 1 MG PO TABS: 1.0000 mg | ORAL_TABLET | Freq: Two times a day (BID) | ORAL | 0 refills | Status: DC

## 2020-02-24 MED ORDER — TRAZODONE HCL 100 MG PO TABS: 100.0000 mg | ORAL_TABLET | Freq: Every evening | ORAL | 0 refills | Status: DC | PRN

## 2020-02-24 NOTE — BHH Counselor (Signed)
CSW spoke with [facility] ACT Team who expressed no concerns for pt discharging at this time and reported that they would follow up with this pt at his residence on 02/25/20 if he is discharged on this date.    Ruthann Cancer MSW, [employer] Clincal Social Worker  [facility]

## 2020-02-24 NOTE — Progress Notes (Signed)
  [facility] Adult Case Management Discharge Plan :  Will you be returning to the same living situation after discharge:  Yes,  to aunts residence At discharge, do you have transportation home?: No. Safe Transport will be arranged.  Do you have the ability to pay for your medications: Yes,  has insurance.  Release of information consent forms completed and in the chart;  Patient's signature needed at discharge.  Patient to Follow up at: Follow-up Information    [facility] Follow up on 02/25/2020.   Why: ACTT will follow up on Wednesday 5/19 at residence at 11:30am. Contact information: [address] [place] [id number] [phone]           Next level of care provider has access to [facility] Link:no  Safety Planning and Suicide Prevention discussed: Yes,  with aunt, [name].     Has patient been referred to the Quitline?: Patient refused referral  Patient has been referred for addiction treatment: N/A  Otelia Santee, LCSWA 02/24/2020, 11:18 AM

## 2020-02-24 NOTE — Discharge Summary (Signed)
Physician Discharge Summary Note  Patient:  [name] is an 29 y.o., male MRN:  [id number] DOB:  04/05/1991 Patient phone:  [phone] (home)  Patient address:   [address] [place] [address],  Total Time spent with patient: 45 minutes  Date of Admission:  02/22/2020 Date of Discharge: 02/24/2020  Reason for Admission:  Patient is seen and examined. Patient is a 29 year old male with a past psychiatric history significant for schizophrenia who was brought by a nurse to the [facility] emergency department. Patient stated it was a nurse who spoken to his mother. During the interview today the only answer to questions was "I do not know, I do not know". He denied auditory or visual hallucinations. He denied suicidal or homicidal ideation. He stated he was not sure how long he had not had his medications. He stated he did not believe that he had followed up with psychiatry. He was unsure when he had gotten his last long-acting Invega injection. He told the folks in the emergency department that they could not speak to his mother, but I asked him if I could speak to her and he agreed to that. He does have a guardian. Apparently he lives with his aunt, but she is not his guardian. We discussed the long-acting paliperidone injection, and he agreed to get back on that medicine today. He was admitted to the hospital for evaluation and stabilization. His last psychiatric hospitalization was on 01/05/2020. He had been brought in by law enforcement because of acting bizarrely and endorsing paranoid thinking. He was discharged on the long-acting paliperidone injection and oral paliperidone. He was seen in the emergency department on 01/09/2020. This again was secondary to noncompliance with medications, but his behavior was fine at that time. He was not held at that time.  In the past he has had auditory and visual hallucinations.  Associated Signs/Symptoms: Depression  Symptoms:  anhedonia, insomnia, disturbed sleep, (Hypo) Manic Symptoms:  Delusions, Anxiety Symptoms:  Excessive Worry, Psychotic Symptoms:  Delusions, Paranoia, PTSD Symptoms: Negative   Past Psychiatric History: Patient's last admission to our facility was in 01/05/2020.  Prior to that was a hospitalization here on 02/14/2018.  He has had previous psychiatric admissions to  Hospital in 2017 as well.  He has had follow-up set up with [facility] as an outpatient, but he is usually noncompliant.  He has received the paliperidone long-acting injection as far back as 2019.   Principal Problem: <principal problem not specified> Discharge Diagnoses: Active Problems:   Schizophrenia, paranoid type [facility])   Past Medical History:  Past Medical History:  Diagnosis Date  . Depression   . Schizophrenia [facility])     Past Surgical History:  Procedure Laterality Date  . BACK SURGERY     Family History: History reviewed. No pertinent family history. Family Psychiatric  History: Unable to obtain  Social History:  Social History   Substance and Sexual Activity  Alcohol Use No     Social History   Substance and Sexual Activity  Drug Use Yes  . Types: Marijuana   Comment: rare    Social History   Socioeconomic History  . Marital status: Single    Spouse name: Not on file  . Number of children: Not on file  . Years of education: Not on file  . Highest education level: Not on file  Occupational History  . Occupation: unemployed  Tobacco Use  . Smoking status: Current Some Day Smoker  Types: Cigarettes  . Smokeless tobacco: Never Used  Substance and Sexual Activity  . Alcohol use: No  . Drug use: Yes    Types: Marijuana    Comment: rare  . Sexual activity: Yes    Birth control/protection: Condom  Other Topics Concern  . Not on file  Social History Narrative   [employer] certain topics - tangential thought process, cannot answer clearly. Pt not sure about school;  reportedly is homeless.    Social Determinants of Health   Financial Resource Strain:   . Difficulty of Paying Living Expenses:   Food Insecurity:   . Worried About Programme researcher, broadcasting/film/video in the Last Year:   . Barista in the Last Year:   Transportation Needs:   . Freight forwarder (Medical):   Marland Kitchen Lack of Transportation (Non-Medical):   Physical Activity:   . Days of Exercise per Week:   . Minutes of Exercise per Session:   Stress:   . Feeling of Stress :   Social Connections:   . Frequency of Communication with Friends and Family:   . Frequency of Social Gatherings with Friends and Family:   . Attends Religious Services:   . Active Member of Clubs or Organizations:   . Attends Banker Meetings:   Marland Kitchen Marital Status:     Hospital Course:   29 year old male with the above-stated psychiatric history who was admitted due to secondary paranoia and noncompliance to medication.  He was restarted on his paliperidone or 6 mg p.o. nightly for psychosis.  He also agreed during his admission to take the paliperidone long-acting injectable, which was initiated at 234 mg IM and a single dose.  He was also placed on Cogentin for possible side effects of medication.  Due to his history of insomnia he was started on trazodone 100 mg p.o. nightly as needed for insomnia throughout the admission we did remain in contact with his guardian regarding changes to medication and his treatment plan.  His labs that were obtained on admission were reviewed and determined to be within normal to include his CBC, urinalysis and blood alcohol level.  His urine drug screen was positive for marijuana.  Upon discharge he had no thoughts of harming himself, others, and could contract for safety.  Physical Findings: AIMS: Facial and Oral Movements Muscles of Facial Expression: None, normal Lips and Perioral Area: None, normal Jaw: None, normal Tongue: None, normal,Extremity Movements Upper (arms,  wrists, hands, fingers): None, normal Lower (legs, knees, ankles, toes): None, normal, Trunk Movements Neck, shoulders, hips: None, normal, Overall Severity Severity of abnormal movements (highest score from questions above): None, normal Incapacitation due to abnormal movements: None, normal Patient's awareness of abnormal movements (rate only patient's report): No Awareness, Dental Status Current problems with teeth and/or dentures?: No Does patient usually wear dentures?: No  CIWA:    COWS:     Musculoskeletal: Strength & Muscle Tone: within normal limits Gait & Station: normal Patient leans: N/A  Psychiatric Specialty Exam: See MD SRA Physical Exam   Review of Systems   Blood pressure (!) 123/99, pulse 95, temperature 97.8 F (36.6 C), temperature source Oral, resp. rate 16, height 5\' 5"  (1.651 m), weight 69.4 kg, SpO2 100 %.Body mass index is 25.46 kg/m.  Sleep:  Number of Hours: 6.75        Has this patient used any form of tobacco in the last 30 days? (Cigarettes, Smokeless Tobacco, Cigars, and/or Pipes)  No  Blood Alcohol  level:  Lab Results  Component Value Date   ETH <10 02/19/2020   ETH <10 44/96/7591    Metabolic Disorder Labs:  Lab Results  Component Value Date   HGBA1C 5.2 05/12/2016   Lab Results  Component Value Date   PROLACTIN 79.4 (H) 02/15/2018   PROLACTIN 30.5 (H) 05/12/2016   Lab Results  Component Value Date   CHOL 176 05/12/2016   TRIG 312 (H) 05/12/2016   HDL 55 05/12/2016   CHOLHDL 3.2 05/12/2016   VLDL 62 (H) 05/12/2016   LDLCALC 59 05/12/2016    See Psychiatric Specialty Exam and Suicide Risk Assessment completed by Attending Physician prior to discharge.  Discharge destination:  Home  Is patient on multiple antipsychotic therapies at discharge:  No   Has Patient had three or more failed trials of antipsychotic monotherapy by history:  No  Recommended Plan for Multiple Antipsychotic Therapies: NA  Discharge Instructions     Discharge instructions   Complete by: As directed    Please continue to take medications as directed. If your symptoms return, worsen, or persist please call your 911, report to local ER, or contact crisis hotline. Please do not drink alcohol or use any illegal substances while taking prescription medications.   Discharge instructions   Complete by: As directed    Please continue to take medications as directed. If your symptoms return, worsen, or persist please call your 911, report to local ER, or contact crisis hotline. Please do not drink alcohol or use any illegal substances while taking prescription medications.     Allergies as of 02/24/2020      Reactions   Shellfish Allergy Hives, Itching   seafood      Medication List    STOP taking these medications   Invega Sustenna 234 MG/1.5ML Susy injection Generic drug: paliperidone   risperiDONE 3 MG tablet Commonly known as: RisperDAL     TAKE these medications     Indication  benztropine 1 MG tablet Commonly known as: COGENTIN Take 1 tablet (1 mg total) by mouth 2 (two) times daily.  Indication: Extrapyramidal Reaction caused by Medications   paliperidone 6 MG 24 hr tablet Commonly known as: INVEGA Take 1 tablet (6 mg total) by mouth at bedtime.  Indication: Schizophrenia   traZODone 100 MG tablet Commonly known as: DESYREL Take 1 tablet (100 mg total) by mouth at bedtime as needed for sleep.  Indication: Trouble Sleeping      Follow-up Information    [facility] Follow up on 02/25/2020.   Why: ACTT will follow up on Wednesday 5/19 at residence at 11:30am. Contact information: [place] [address] [phone]          Signed: Suella Broad, FNP 02/24/2020, 2:23 PM

## 2020-02-24 NOTE — [facility] Suicide Risk Assessment (Signed)
[facility] Discharge Suicide Risk Assessment   Principal Problem: <principal problem not specified> Discharge Diagnoses: Active Problems:   Schizophrenia, paranoid type (HCC)   Total Time spent with patient: 15 minutes  Musculoskeletal: Strength & Muscle Tone: within normal limits Gait & Station: normal Patient leans: N/A  Psychiatric Specialty Exam: Review of Systems  All other systems reviewed and are negative.   Blood pressure (!) 123/99, pulse 95, temperature 97.8 F (36.6 C), temperature source Oral, resp. rate 16, height 5\' 5"  (1.651 m), weight 69.4 kg, SpO2 100 %.Body mass index is 25.46 kg/m.  General Appearance: Casual  Eye Contact::  Good  Speech:  Normal Rate409  Volume:  Normal  Mood:  Euthymic  Affect:  Congruent  Thought Process:  Coherent and Descriptions of Associations: Intact  Orientation:  Full (Time, Place, and Person)  Thought Content:  Logical  Suicidal Thoughts:  No  Homicidal Thoughts:  No  Memory:  Immediate;   Good Recent;   Good Remote;   Good  Judgement:  Intact  Insight:  Fair  Psychomotor Activity:  Normal  Concentration:  Good  Recall:  Good  Fund of Knowledge:Good  Language: Good  Akathisia:  Negative  Handed:  Right  AIMS (if indicated):     Assets:  Desire for Improvement Housing Resilience Social Support Talents/Skills  Sleep:  Number of Hours: 6.75  Cognition: WNL  ADL's:  Intact   Mental Status Per Nursing Assessment::   On Admission:  NA  Demographic Factors:  Male and Low socioeconomic status  Loss Factors: NA  Historical Factors: Impulsivity  Risk Reduction Factors:   Living with another person, especially a relative and Positive social support  Continued Clinical Symptoms:  Schizophrenia:   Less than 73 years old Paranoid or undifferentiated type  Cognitive Features That Contribute To Risk:  None    Suicide Risk:  Minimal: No identifiable suicidal ideation.  Patients presenting with no risk factors but  with morbid ruminations; may be classified as minimal risk based on the severity of the depressive symptoms  Follow-up Information    [facility] Follow up on 02/25/2020.   Why: ACTT will follow up on Wednesday 5/19 at residence at 11:30am. Contact information: [address] [place] [phone]           Plan Of Care/Follow-up recommendations:  Activity:  ad lib  [phone], MD 02/24/2020, 11:01 AM

## 2020-02-24 NOTE — Plan of Care (Signed)
Pt is being discharged.    Jania Steinke, LRT/CTRS 

## 2020-02-24 NOTE — [facility] Suicide Risk Assessment (Signed)
Suicide Prevention Education: Education Completed; Aunt [name] [phone]) has been identified by the patient as the family member/significant other with whom the patient will be residing, and identified as the person(s) who will aid the patient in the event of a mental health crisis (suicidal ideations/suicide attempt).  With written consent from the patient, the family member/significant other has been provided the following suicide prevention education, prior to the and/or following the discharge of the patient.  The suicide prevention education provided includes the following:  Suicide risk factors  Suicide prevention and interventions  National Suicide Hotline telephone number  [facility] assessment telephone number  [facility] Emergency Assistance 911  [facility] and/or Residential Mobile Crisis Unit telephone number   Request made of family/significant other to:  Remove weapons (e.g., guns, rifles, knives), all items previously/currently identified as safety concern.    Remove drugs/medications (over-the-counter, prescriptions, illicit drugs), all items previously/currently identified as a safety concern.   The family member/significant other verbalizes understanding of the suicide prevention education information provided.  The family member/significant other agrees to remove the items of safety concern listed above.  Pt's aunt, [name] ([phone]) stated that pt is able to return to her residence once discharged. Per Ms. [name] pt is currently receiving ACTT services through [facility]. Ms. [name] states that she has no safety concerns with him returning home, but stated that pt leaves the house on a daily basis and wanders around [place], which concerns her. Pt's aunt also stated that he has not been engaged in services and is not compliant with his medications.    Ruthann Cancer MSW, [employer] Clincal Social Worker  [facility]

## 2020-02-24 NOTE — Progress Notes (Signed)
RN met with pt and reviewed pt's discharge instructions.  Pt verbalized understanding of discharge instructions and pt did not have any questions. RN returned pt's belongings to pt.  Discharge instructions, SRA, and Transition Record paperwork given to pt. Pt denied SI and HI and voiced no concerns.  Pt was appreciative of the care pt received at [facility].  Patient discharged to the lobby where pt's Aunt was waiting to take pt home.  RN reviewed pt's AVS with pt's Aunt per pt's request.

## 2020-02-24 NOTE — Progress Notes (Signed)
Recreation Therapy Notes  INPATIENT RECREATION TR PLAN  Patient Details Name: [name] MRN: [id number] DOB: Jan 16, 1991 Today's Date: 02/24/2020  Rec Therapy Plan Is patient appropriate for Therapeutic Recreation?: Yes Treatment times per week: about 3 days Estimated Length of Stay: 5-7 days TR Treatment/Interventions: Group participation (Comment)  Discharge Criteria Pt will be discharged from therapy if:: Discharged Treatment plan/goals/alternatives discussed and agreed upon by:: Patient/family  Discharge Summary Short term goals set: See patient care plan Short term goals met: Not met Reason goals not met: Pt is being discharged Therapeutic equipment acquired: N/A Reason patient discharged from therapy: Discharge from hospital Pt/family agrees with progress & goals achieved: Yes Date patient discharged from therapy: 02/24/20     Victorino Sparrow, LRT/CTRS  Ria Comment, [place] 02/24/2020, 12:20 PM

## 2020-05-08 ENCOUNTER — Ambulatory Visit (HOSPITAL_COMMUNITY)
Admission: EM | Admit: 2020-05-08 | Discharge: 2020-05-08 | Disposition: A | Discharge status: Home / Self Care | Payer: Medicaid Other | Attending: Nurse Practitioner | Admitting: Nurse Practitioner

## 2020-05-08 ENCOUNTER — Other Ambulatory Visit: Payer: Self-pay

## 2020-05-08 ENCOUNTER — Encounter (HOSPITAL_COMMUNITY): Payer: Self-pay | Admitting: Emergency Medicine

## 2020-05-08 DIAGNOSIS — Z20822 Contact with and (suspected) exposure to covid-19: Secondary | ICD-10-CM | POA: Diagnosis not present

## 2020-05-08 DIAGNOSIS — Z590 Homelessness: Secondary | ICD-10-CM | POA: Insufficient documentation

## 2020-05-08 DIAGNOSIS — F200 Paranoid schizophrenia: Secondary | ICD-10-CM | POA: Diagnosis not present

## 2020-05-08 DIAGNOSIS — F17210 Nicotine dependence, cigarettes, uncomplicated: Secondary | ICD-10-CM | POA: Diagnosis not present

## 2020-05-08 DIAGNOSIS — F1290 Cannabis use, unspecified, uncomplicated: Secondary | ICD-10-CM | POA: Insufficient documentation

## 2020-05-08 LAB — POCT URINE DRUG SCREEN - MANUAL ENTRY (I-SCREEN)
POC Amphetamine UR: NOT DETECTED
POC Buprenorphine (BUP): NOT DETECTED
POC Cocaine UR: NOT DETECTED
POC Marijuana UR: POSITIVE — AB
POC Methadone UR: NOT DETECTED
POC Methamphetamine UR: NOT DETECTED
POC Morphine: NOT DETECTED
POC Oxazepam (BZO): NOT DETECTED
POC Oxycodone UR: NOT DETECTED
POC Secobarbital (BAR): NOT DETECTED

## 2020-05-08 LAB — COMPREHENSIVE METABOLIC PANEL
ALT: 14 U/L (ref 0–44)
AST: 22 U/L (ref 15–41)
Albumin: 4.2 g/dL (ref 3.5–5.0)
Alkaline Phosphatase: 34 U/L — ABNORMAL LOW (ref 38–126)
Anion gap: 10 (ref 5–15)
BUN: 13 mg/dL (ref 6–20)
CO2: 26 mmol/L (ref 22–32)
Calcium: 9.3 mg/dL (ref 8.9–10.3)
Chloride: 103 mmol/L (ref 98–111)
Creatinine, Ser: 1.33 mg/dL — ABNORMAL HIGH (ref 0.61–1.24)
GFR calc Af Amer: >60 mL/min (ref >60)
GFR calc non Af Amer: >60 mL/min (ref >60)
Glucose, Bld: 97 mg/dL (ref 70–99)
Potassium: 3.5 mmol/L (ref 3.5–5.1)
Sodium: 139 mmol/L (ref 135–145)
Total Bilirubin: 1 mg/dL (ref 0.3–1.2)
Total Protein: 6.6 g/dL (ref 6.5–8.1)

## 2020-05-08 LAB — CBC WITH DIFFERENTIAL/PLATELET
Abs Immature Granulocytes: 0.01 10*3/uL (ref 0.00–0.07)
Basophils Absolute: 0 10*3/uL (ref 0.0–0.1)
Basophils Relative: 1 %
Eosinophils Absolute: 0.3 10*3/uL (ref 0.0–0.5)
Eosinophils Relative: 6 %
HCT: 43.5 % (ref 39.0–52.0)
Hemoglobin: 14.8 g/dL (ref 13.0–17.0)
Immature Granulocytes: 0 %
Lymphocytes Relative: 60 %
Lymphs Abs: 3.2 10*3/uL (ref 0.7–4.0)
MCH: 31 pg (ref 26.0–34.0)
MCHC: 34 g/dL (ref 30.0–36.0)
MCV: 91 fL (ref 80.0–100.0)
Monocytes Absolute: 0.3 10*3/uL (ref 0.1–1.0)
Monocytes Relative: 6 %
Neutro Abs: 1.5 10*3/uL — ABNORMAL LOW (ref 1.7–7.7)
Neutrophils Relative %: 27 %
Platelets: 214 10*3/uL (ref 150–400)
RBC: 4.78 MIL/uL (ref 4.22–5.81)
RDW: 12.5 % (ref 11.5–15.5)
WBC: 5.3 10*3/uL (ref 4.0–10.5)
nRBC: 0 % (ref 0.0–0.2)

## 2020-05-08 LAB — POC SARS CORONAVIRUS 2 AG: SARS Coronavirus 2 Ag: NEGATIVE

## 2020-05-08 MED ORDER — MAGNESIUM HYDROXIDE 400 MG/5ML PO SUSP: 30.0000 mL | Freq: Every day | ORAL | Status: DC | PRN

## 2020-05-08 MED ORDER — ACETAMINOPHEN 325 MG PO TABS: 650.0000 mg | ORAL_TABLET | Freq: Four times a day (QID) | ORAL | Status: DC | PRN

## 2020-05-08 MED ORDER — ALUM & MAG HYDROXIDE-SIMETH 200-200-20 MG/5ML PO SUSP: 30.0000 mL | ORAL | Status: DC | PRN

## 2020-05-08 MED ORDER — BENZTROPINE MESYLATE 1 MG PO TABS
1.0000 mg | ORAL_TABLET | Freq: Two times a day (BID) | ORAL | Status: DC
  Administered 2020-05-08: 1 mg via ORAL
  Filled 2020-05-08: qty 1

## 2020-05-08 MED ORDER — HYDROXYZINE HCL 25 MG PO TABS
25.0000 mg | ORAL_TABLET | Freq: Three times a day (TID) | ORAL | Status: DC | PRN
  Administered 2020-05-08: 25 mg via ORAL
  Filled 2020-05-08: qty 1

## 2020-05-08 MED ORDER — PALIPERIDONE ER 6 MG PO TB24
6.0000 mg | ORAL_TABLET | Freq: Every day | ORAL | Status: DC
  Administered 2020-05-08: 6 mg via ORAL
  Filled 2020-05-08: qty 1

## 2020-05-08 MED ORDER — TRAZODONE HCL 100 MG PO TABS: 100.0000 mg | ORAL_TABLET | Freq: Every evening | ORAL | Status: DC | PRN

## 2020-05-08 NOTE — ED Triage Notes (Signed)
Presents with auditory hallucinations and paranoia.

## 2020-05-08 NOTE — ED Notes (Signed)
Pt. Given sandwich.

## 2020-05-08 NOTE — ED Provider Notes (Signed)
FBC/OBS ASAP Discharge Summary  Date and Time: 05/08/2020 10:05 AM  Name: [name]  MRN:  [id number]   Discharge Diagnoses:  Final diagnoses:  Schizophrenia, paranoid (HCC)    Subjective: Patient reports this morning that he is feeling better.  He denies any suicidal or homicidal ideations and denies any hallucinations.  Patient reports that he does not have anywhere to live and that he needs some help with housing.  Patient reports that he has been acting that he cannot remember the name of him.  Patient reports other than needing housing that he is doing good. Social work contacted [employer] and patient is active with them.  They have been coming to contact him for several days.  They report that patient actually lives with his aunt but has not been staying there because there is too manipulating the stay in the house.  They also report that the patient is in ATC program to assist with housing.  They suggest him going to a shelter or back to his aunt's house so that he can get housing until they can follow-up with him on Monday. Contacted patient's legal guardian, [name], and she was notified of the patient being here and being discharged today.  She states that he is not homeless but chooses not to stay at his aunt's house sometimes.  She states that she has no safety concerns with him being discharged today if he is denying suicidal and homicidal ideations.  She is informed that the ACT team has been notified of his discharge and that they will follow-up with him on Monday and she stated that that was a good plan. Patient was informed of contact with his legal guardian as well as with [facility].  He states that he will return to his aunt's house and stay there until Monday so he can be followed up by [name].  Patient has continued to deny any suicidal homicidal ideations and denied any hallucinations.  Patient has been compliant with his medications.  Stay Summary: Patient is a  29 year old male who presented to the [facility] with law enforcement reporting paranoid ideation.  Patient was admitted to the continuous observation unit and medications restarted.  Patient remained calm and cooperative on the unit and was eating well, sleeping well, and was taking his medications as prescribed.  Patient's legal guardian and ACT team was contacted.  Safety plan was established and [facility] will follow-up with patient on Monday.  Patient has continued to deny any suicidal or homicidal ideations and denies any hallucinations.  Patient stated he felt safe returning to his aunt's house knowing that [facility] will follow up with him on Monday and that they are assisting him with housing arrangements.  Total Time spent with patient: 30 minutes  Past Psychiatric History: Schizophrenia.  He was inpatient at behavioral health [facility] March 2021, May 2021, April 2019, May 2019 Past Medical History:  Past Medical History:  Diagnosis Date  . Depression   . Schizophrenia [facility])     Past Surgical History:  Procedure Laterality Date  . BACK SURGERY     Family History: History reviewed. No pertinent family history. Family Psychiatric History: None reported Social History:  Social History   Substance and Sexual Activity  Alcohol Use No     Social History   Substance and Sexual Activity  Drug Use Yes  . Types: Marijuana   Comment: rare    Social History   Socioeconomic History  . Marital status:  Single    Spouse name: Not on file  . Number of children: Not on file  . Years of education: Not on file  . Highest education level: Not on file  Occupational History  . Occupation: unemployed  Tobacco Use  . Smoking status: Current Some Day Smoker    Types: Cigarettes  . Smokeless tobacco: Never Used  Vaping Use  . Vaping Use: Never used  Substance and Sexual Activity  . Alcohol use: No  . Drug use: Yes    Types: Marijuana    Comment: rare  . Sexual activity: Yes    Birth  control/protection: Condom  Other Topics Concern  . Not on file  Social History Narrative   [employer] certain topics - tangential thought process, cannot answer clearly. Pt not sure about school; reportedly is homeless.    Social Determinants of Health   Financial Resource Strain:   . Difficulty of Paying Living Expenses:   Food Insecurity:   . Worried About Programme researcher, broadcasting/film/video in the Last Year:   . Barista in the Last Year:   Transportation Needs:   . Freight forwarder (Medical):   Marland Kitchen Lack of Transportation (Non-Medical):   Physical Activity:   . Days of Exercise per Week:   . Minutes of Exercise per Session:   Stress:   . Feeling of Stress :   Social Connections:   . Frequency of Communication with Friends and Family:   . Frequency of Social Gatherings with Friends and Family:   . Attends Religious Services:   . Active Member of Clubs or Organizations:   . Attends Banker Meetings:   Marland Kitchen Marital Status:    SDOH:  SDOH Screenings   Alcohol Screen: Low Risk   . Last Alcohol Screening Score (AUDIT): 0  Depression (PHQ2-9): Medium Risk  . PHQ-2 Score: 6  Financial Resource Strain:   . Difficulty of Paying Living Expenses:   Food Insecurity:   . Worried About Programme researcher, broadcasting/film/video in the Last Year:   . [employer] of Food in the Last Year:   Housing:   . Last Housing Risk Score:   Physical Activity:   . Days of Exercise per Week:   . Minutes of Exercise per Session:   Social Connections:   . Frequency of Communication with Friends and Family:   . Frequency of Social Gatherings with Friends and Family:   . Attends Religious Services:   . Active Member of Clubs or Organizations:   . Attends Banker Meetings:   Marland Kitchen Marital Status:   Stress:   . Feeling of Stress :   Tobacco Use: High Risk  . Smoking Tobacco Use: Current Some Day Smoker  . Smokeless Tobacco Use: Never Used  Transportation Needs:   . Freight forwarder (Medical):   Marland Kitchen Lack  of Transportation (Non-Medical):     Has this patient used any form of tobacco in the last 30 days? (Cigarettes, Smokeless Tobacco, Cigars, and/or Pipes) A prescription for an FDA-approved tobacco cessation medication was offered at discharge and the patient refused  Current Medications:  Current Facility-Administered Medications  Medication Dose Route Frequency Provider Last Rate Last Admin  . acetaminophen (TYLENOL) tablet 650 mg  650 mg Oral Q6H PRN [name] A, NP      . alum & mag hydroxide-simeth (MAALOX/MYLANTA) 200-200-20 MG/5ML suspension 30 mL  30 mL Oral Q4H PRN Jackelyn Poling, NP      .  benztropine (COGENTIN) tablet 1 mg  1 mg Oral BID [name] A, NP   1 mg at 05/08/20 5329  . hydrOXYzine (ATARAX/VISTARIL) tablet 25 mg  25 mg Oral TID PRN Jackelyn Poling, NP   25 mg at 05/08/20 0527  . magnesium hydroxide (MILK OF MAGNESIA) suspension 30 mL  30 mL Oral Daily PRN [name] A, NP      . paliperidone (INVEGA) 24 hr tablet 6 mg  6 mg Oral QHS [name] A, NP   6 mg at 05/08/20 0527  . traZODone (DESYREL) tablet 100 mg  100 mg Oral QHS PRN Jackelyn Poling, NP       Current Outpatient Medications  Medication Sig Dispense Refill  . benztropine (COGENTIN) 1 MG tablet Take 1 tablet (1 mg total) by mouth 2 (two) times daily. 60 tablet 0  . paliperidone (INVEGA) 6 MG 24 hr tablet Take 1 tablet (6 mg total) by mouth at bedtime. 30 tablet 0  . traZODone (DESYREL) 100 MG tablet Take 1 tablet (100 mg total) by mouth at bedtime as needed for sleep. 30 tablet 0    PTA Medications: (Not in a hospital admission)   Musculoskeletal  Strength & Muscle Tone: within normal limits Gait & Station: normal Patient leans: N/A  Psychiatric Specialty Exam  Presentation  General Appearance: Appropriate for Environment;Casual  Eye Contact:Good  Speech:Clear and Coherent;Normal Rate  Speech Volume:Normal  Handedness:Right   Mood and Affect   Mood:Euthymic  Affect:Congruent   Thought Process  Thought Processes:Coherent  Descriptions of Associations:Intact  Orientation:Full (Time, Place and Person)  Thought Content:WDL  Hallucinations:Hallucinations: None  Ideas of Reference:None  Suicidal Thoughts:Suicidal Thoughts: No  Homicidal Thoughts:Homicidal Thoughts: No   Sensorium  Memory:Immediate Good;Recent Good;Remote Good  Judgment:Fair  Insight:Fair   Executive Functions  Concentration:Good  Attention Span:Good  Recall:Good  Fund of Knowledge:Fair  Language:Good   Psychomotor Activity  Psychomotor Activity:Psychomotor Activity: Normal   Assets  Assets:Communication Skills;Desire for Improvement;Financial Resources/Insurance;Housing;Social Support;Transportation   Sleep  Sleep:Sleep: Good   Physical Exam  Physical Exam Vitals and nursing note reviewed.  Constitutional:      Appearance: He is well-developed.  Cardiovascular:     Rate and Rhythm: Normal rate.  Pulmonary:     Effort: Pulmonary effort is normal.  Musculoskeletal:        General: Normal range of motion.  Skin:    General: Skin is warm.  Neurological:     Mental Status: He is alert and oriented to person, place, and time.    Review of Systems  Constitutional: Negative.   HENT: Negative.   Eyes: Negative.   Respiratory: Negative.   Cardiovascular: Negative.   Gastrointestinal: Negative.   Genitourinary: Negative.   Musculoskeletal: Negative.   Skin: Negative.   Neurological: Negative.   Endo/Heme/Allergies: Negative.   Psychiatric/Behavioral: Negative.    Blood pressure 115/73, pulse 61, temperature 97.9 F (36.6 C), temperature source Temporal, resp. rate 16, height 5' 7.72" (1.72 m), weight 152 lb (68.9 kg), SpO2 100 %. Body mass index is 23.31 kg/m.  Demographic Factors:  Male  Loss Factors: NA  Historical Factors: NA  Risk Reduction Factors:   Living with another person, especially a relative,  Positive social support, Positive therapeutic relationship and Positive coping skills or problem solving skills  Continued Clinical Symptoms:  Previous Psychiatric Diagnoses and Treatments  Cognitive Features That Contribute To Risk:  None    Suicide Risk:  Minimal: No identifiable suicidal ideation.  Patients presenting with no  risk factors but with morbid ruminations; may be classified as minimal risk based on the severity of the depressive symptoms  Plan Of Care/Follow-up recommendations:  Continue activity as tolerated. Continue diet as recommended by your PCP. Ensure to keep all appointments with outpatient providers.  Disposition: Discharge home and lives with his aunt. Has [name] for follow up  Maryfrances Bunnellravis B Nandan Willems, FNP 05/08/2020, 10:05 AM

## 2020-05-08 NOTE — ED Notes (Signed)
Pt. Belongings placed in locker number 21

## 2020-05-08 NOTE — ED Provider Notes (Signed)
Behavioral Health Admission [facility]&P [facility](FBC & OBS)  Date: 05/08/20 Patient Name: [name] MRN: [id number] Chief Complaint:  Chief Complaint  Patient presents with  . Hallucinations  . Paranoid   Chief Complaint/Presenting Problem: Pt has diagnosis of schizophrenia and reports feeling paranoid and fearful.  Diagnoses:  Final diagnoses:  Schizophrenia, paranoid (HCC)    HPI: [name] is a 29 y.o. male who [facility] with [facility] enforcement due to paranoid ideation.  Patient reports that he contacted the police because he was feeling paranoid and stated that he felt unsafe outside.  He states "everything is just weird outside, I don't know."  States that unknown people are after him. Based on review of previous assessments, the patient appeared to be more interactive during this assessment.  He does state "I don't know" to a lot of questions, but then follows with an appropriate response.  When asked about his appetite, he states that he has not been eating well because he recently had some trouble with his girlfriend.  He then states "but, Im not going to let that bother me. I am going to get over it, because you need energy to eat." Patient reports that he has not taken medication or follow-up with psychiatry since [facility].  There is no record of any encounters since his discharge in May 2021.  When asked if he wanted to restart his medications, he stated no, but then stated that he needed medication for paranoia and to help him relax.  He states he does not know when he received his last paliperidone injection.   On evaluation, the patient is alert and oriented x4.  He is pleasant and cooperative.  His speech is clear and coherent, normal pace, normal volume.  He reports his mood is depressed.  His affect is congruent with mood.  He does smile appropriately at times.  He denies auditory and visual hallucinations.  However, he  does appear to briefly respond.  To internal stimuli at times.  He reports paranoia.  He denies suicidal ideations.  He denies homicidal ideations.  He reports regular use of marijuana.  He states that he has used cocaine in the past, but not recently.  He denies alcohol use.  Urine drug screen is positive for marijuana, otherwise negative.  PHQ 2-9:    [facility]  Thoughts that you would be better off dead, or of hurting yourself in some way Not at all  PHQ-9 Total Score 6        [facility]) [facility] Ad[facility] ([facility]) [facility] 11[facility]15/2018 in [facility]  C-SSRS RISK CATEGORY Error: Question 2 not populated No Risk No Risk       Total Time spent with patient: 30 minutes  Musculoskeletal  Strength & Muscle Tone: within normal limits Gait & Station: normal Patient leans: N/A  Psychiatric Specialty Exam  Presentation General Appearance: Appropriate for Environment;Fairly Groomed  Eye Contact:Fair  Speech:Clear and Coherent;Normal Rate  Speech Volume:Normal  Handedness:Right   Mood and Affect  Mood:Anxious;Depressed;Hopeless  Affect:Congruent;Depressed   Thought Process  Thought Processes:Coherent;Linear  Descriptions of Associations:Intact  Orientation:Full (Time, Place and Person)  Thought Content:Paranoid Ideation  Hallucinations:Hallucinations: Other (comment) (Denies, appears to be responding to internal stimuli at times)  Ideas of Reference:Paranoia  Suicidal Thoughts:Suicidal Thoughts: No  Homicidal Thoughts:Homicidal Thoughts: No   Sensorium  Memory:Immediate Fair;Recent Fair;Remote Fair  Judgment:Intact  Insight:Lacking   Executive Functions  Concentration:Fair  Attention Span:Fair  Recall:Fair  Fund of  Knowledge:Fair  Language:Good   Psychomotor Activity  Psychomotor Activity:Psychomotor Activity: Normal   Assets  Assets:Desire for Improvement;Leisure Time;Physical Health   Sleep  Sleep:Sleep: Fair   Physical Exam Constitutional:      General: He is not in acute distress.    Appearance: He is not ill-appearing, toxic-appearing or diaphoretic.  HENT:     Head: Normocephalic.     Right Ear: External ear normal.     Left Ear: External ear normal.  Eyes:     Pupils: Pupils are equal, round, and reactive to light.  Cardiovascular:     Rate and Rhythm: Normal rate.  Pulmonary:     Effort: Pulmonary effort is normal. No respiratory distress.  Musculoskeletal:        General: Normal range of motion.  Neurological:     Mental Status: He is alert and oriented to person, place, and time.  Psychiatric:        Mood and Affect: Mood is depressed.        Behavior: Behavior is cooperative.        Thought Content: Thought content is paranoid. Thought content is not delusional. Thought content does not include homicidal or suicidal ideation. Thought content does not include suicidal plan.    Review of Systems  Constitutional: Negative for chills, diaphoresis, fever, malaise/fatigue and weight loss.  HENT: Negative for congestion.   Respiratory: Negative for cough and shortness of breath.   Cardiovascular: Negative for chest pain and palpitations.  Gastrointestinal: Negative for diarrhea, nausea and vomiting.  Neurological: Negative for dizziness and seizures.  Psychiatric/Behavioral: Positive for depression. Negative for hallucinations, memory loss, substance abuse and suicidal ideas. The patient is nervous/anxious and has insomnia.   All other systems reviewed and are negative.   Blood pressure (!) 129/73, pulse 62, temperature (!) 97.1 F (36.2 C), temperature source Tympanic, resp. rate 18, height 5' 7.72" (1.72 m), weight 152 lb (68.9 kg), SpO2 97 %. Body mass index is 23.31  kg/m.  Past Psychiatric History: Schizophrenia.  He was inpatient at behavioral health [facility] March 2021, May 2021, April 2019, May 2019.  Is the patient at risk to self? Yes  Has the patient been a risk to self in the past 6 months? Yes .    Has the patient been a risk to self within the distant past? No   Is the patient a risk to others? No   Has the patient been a risk to others in the past 6 months? No   Has the patient been a risk to others within the distant past? No   Past Medical History:  Past Medical History:  Diagnosis Date  . Depression   . Schizophrenia [facility])     Past Surgical History:  Procedure Laterality Date  . BACK SURGERY      Family History: History reviewed. No pertinent family history.  Social History:  Social History   Socioeconomic History  . Marital status: Single    Spouse name: Not on file  . Number of children: Not on file  . Years of education: Not on file  . Highest education level: Not on file  Occupational History  . Occupation: unemployed  Tobacco Use  . Smoking status: Current Some Day Smoker    Types: Cigarettes  . Smokeless tobacco: Never Used  Vaping  Use  . Vaping Use: Never used  Substance and Sexual Activity  . Alcohol use: No  . Drug use: Yes    Types: Marijuana    Comment: rare  . Sexual activity: Yes    Birth control/protection: Condom  Other Topics Concern  . Not on file  Social History Narrative   UTA certain topics - tangential thought process, cannot answer clearly. Pt not sure about school; reportedly is homeless.    Social Determinants of Health   Financial Resource Strain:   . Difficulty of Paying Living Expenses:   Food Insecurity:   . Worried About Programme researcher, broadcasting/film/video in the Last Year:   . Barista in the Last Year:   Transportation Needs:   . Freight forwarder (Medical):   Marland Kitchen Lack of Transportation (Non-Medical):   Physical Activity:   . Days of Exercise per Week:   . Minutes of Exercise  per Session:   Stress:   . Feeling of Stress :   Social Connections:   . Frequency of Communication with Friends and Family:   . Frequency of Social Gatherings with Friends and Family:   . Attends Religious Services:   . Active Member of Clubs or Organizations:   . Attends Banker Meetings:   Marland Kitchen Marital Status:   Intimate Partner Violence:   . Fear of Current or Ex-Partner:   . Emotionally Abused:   Marland Kitchen Physically Abused:   . Sexually Abused:     SDOH:  SDOH Screenings   Alcohol Screen: Low Risk   . Last Alcohol Screening Score (AUDIT): 0  Depression (PHQ2-9): Medium Risk  . PHQ-2 Score: 6  Financial Resource Strain:   . Difficulty of Paying Living Expenses:   Food Insecurity:   . Worried About Programme researcher, broadcasting/film/video in the Last Year:   . [employer] of Food in the Last Year:   Housing:   . Last Housing Risk Score:   Physical Activity:   . Days of Exercise per Week:   . Minutes of Exercise per Session:   Social Connections:   . Frequency of Communication with Friends and Family:   . Frequency of Social Gatherings with Friends and Family:   . Attends Religious Services:   . Active Member of Clubs or Organizations:   . Attends Banker Meetings:   Marland Kitchen Marital Status:   Stress:   . Feeling of Stress :   Tobacco Use: High Risk  . Smoking Tobacco Use: Current Some Day Smoker  . Smokeless Tobacco Use: Never Used  Transportation Needs:   . Freight forwarder (Medical):   Marland Kitchen Lack of Transportation (Non-Medical):     Last Labs:  Admission on 05/08/2020  Component Date Value Ref Range Status  . POC Amphetamine UR 05/08/2020 None Detected  None Detected Preliminary  . POC Secobarbital (BAR) 05/08/2020 None Detected  None Detected Preliminary  . POC Buprenorphine (BUP) 05/08/2020 None Detected  None Detected Preliminary  . POC Oxazepam (BZO) 05/08/2020 None Detected  None Detected Preliminary  . POC Cocaine UR 05/08/2020 None Detected  None Detected  Preliminary  . POC Methamphetamine UR 05/08/2020 None Detected  None Detected Preliminary  . POC Morphine 05/08/2020 None Detected  None Detected Preliminary  . POC Oxycodone UR 05/08/2020 None Detected  None Detected Preliminary  . POC Methadone UR 05/08/2020 None Detected  None Detected Preliminary  . POC Marijuana UR 05/08/2020 Positive* None Detected Preliminary  . SARS Coronavirus 2 Ag  05/08/2020 NEGATIVE  NEGATIVE Final   Comment: (NOTE) SARS-CoV-2 antigen NOT DETECTED.   Negative results are presumptive.  Negative results do not preclude SARS-CoV-2 infection and should not be used as the sole basis for treatment or other patient management decisions, including infection  control decisions, particularly in the presence of clinical signs and  symptoms consistent with COVID-19, or in those who have been in contact with the virus.  Negative results must be combined with clinical observations, patient history, and epidemiological information. The expected result is Negative.  Fact Sheet for Patients: https://sanders-williams.net/  Fact Sheet for Healthcare Providers: https://martinez.com/   This test is not yet approved or cleared by the [place] FDA and  has been authorized for detection and/or diagnosis of SARS-CoV-2 by FDA under an Emergency Use Authorization (EUA).  This EUA will remain in effect (meaning this test can be used) for the duration of  the C                          OVID-19 declaration under Section 564(b)(1) of the Act, 21 U.S.C. section 360bbb-3(b)(1), unless the authorization is terminated or revoked sooner.    Admission on 02/19/2020, Discharged on 02/22/2020  Component Date Value Ref Range Status  . SARS Coronavirus 2 02/20/2020 NEGATIVE  NEGATIVE Final   Comment: (NOTE) SARS-CoV-2 target nucleic acids are NOT DETECTED. The SARS-CoV-2 RNA is generally detectable in upper and lower respiratory specimens during the  acute phase of infection. The lowest concentration of SARS-CoV-2 viral copies this assay can detect is 250 copies / mL. A negative result does not preclude SARS-CoV-2 infection and should not be used as the sole basis for treatment or other patient management decisions.  A negative result may occur with improper specimen collection / handling, submission of specimen other than nasopharyngeal swab, presence of viral mutation(s) within the areas targeted by this assay, and inadequate number of viral copies (<250 copies / mL). A negative result must be combined with clinical observations, patient history, and epidemiological information. Fact Sheet for Patients:   BoilerBrush.com.cy Fact Sheet for Healthcare Providers: https://pope.com/ This test is not yet approved or cleared                           by the [place] FDA and has been authorized for detection and/or diagnosis of SARS-CoV-2 by FDA under an Emergency Use Authorization (EUA).  This EUA will remain in effect (meaning this test can be used) for the duration of the COVID-19 declaration under Section 564(b)(1) of the Act, 21 U.S.C. section 360bbb-3(b)(1), unless the authorization is terminated or revoked sooner. Performed at [facility], [address] [place], [place] [address]   . Acetaminophen (Tylenol), Serum 02/19/2020 <10* 10 - 30 ug/mL Final   Comment: (NOTE) Therapeutic concentrations vary significantly. A range of 10-30 ug/mL  may be an effective concentration for many patients. However, some  are best treated at concentrations outside of this range. Acetaminophen concentrations >150 ug/mL at 4 hours after ingestion  and >50 ug/mL at 12 hours after ingestion are often associated with  toxic reactions. Performed at [facility], [address], [place], [place] [address]   . Sodium 02/19/2020 139  135 - 145 mmol/L Final  .  Potassium 02/19/2020 4.1  3.5 - 5.1 mmol/L Final  . Chloride 02/19/2020 103  98 - 111 mmol/L Final  . CO2 02/19/2020 28  22 - 32 mmol/L Final  . Glucose, Bld 02/19/2020 84  70 - 99 mg/dL Final   Glucose reference range applies only to samples taken after fasting for at least 8 hours.  . BUN 02/19/2020 15  6 - 20 mg/dL Final  . Creatinine, Ser 02/19/2020 1.04  0.61 - 1.24 mg/dL Final  . Calcium 03/50/0938 9.2  8.9 - 10.3 mg/dL Final  . Total Protein 02/19/2020 6.4* 6.5 - 8.1 g/dL Final  . Albumin 18/29/9371 4.0  3.5 - 5.0 g/dL Final  . AST 69/67/8938 18  15 - 41 U/L Final  . ALT 02/19/2020 13  0 - 44 U/L Final  . Alkaline Phosphatase 02/19/2020 36* 38 - 126 U/L Final  . Total Bilirubin 02/19/2020 0.8  0.3 - 1.2 mg/dL Final  . GFR calc non Af Amer 02/19/2020 >60  >60 mL/min Final  . GFR calc Af Amer 02/19/2020 >60  >60 mL/min Final  . Anion gap 02/19/2020 8  5 - 15 Final   Performed at [facility], [address] [place], [place] [address]  . Alcohol, Ethyl (B) 02/19/2020 <10  <10 mg/dL Final   Comment: (NOTE) Lowest detectable limit for serum alcohol is 10 mg/dL. For medical purposes only. Performed at [facility], [address] [place], [place] [address]   . WBC 02/19/2020 5.5  4.0 - 10.5 K/uL Final  . RBC 02/19/2020 4.45  4.22 - 5.81 MIL/uL Final  . Hemoglobin 02/19/2020 13.9  13.0 - 17.0 g/dL Final  . HCT 52/77/8242 40.5  39 - 52 % Final  . MCV 02/19/2020 91.0  80.0 - 100.0 fL Final  . MCH 02/19/2020 31.2  26.0 - 34.0 pg Final  . MCHC 02/19/2020 34.3  30.0 - 36.0 g/dL Final  . RDW 35/36/1443 13.0  11.5 - 15.5 % Final  . Platelets 02/19/2020 204  150 - 400 K/uL Final  . nRBC 02/19/2020 0.0  0.0 - 0.2 % Final  . Neutrophils Relative % 02/19/2020 29  % Final  . Neutro Abs 02/19/2020 1.6* 1.7 - 7.7 K/uL Final  . Lymphocytes Relative 02/19/2020 54  % Final  . Lymphs Abs 02/19/2020 3.0  0.7 - 4.0 K/uL Final  . Monocytes Relative 02/19/2020  7  % Final  . Monocytes Absolute 02/19/2020 0.4  0 - 1 K/uL Final  . Eosinophils Relative 02/19/2020 9  % Final  . Eosinophils Absolute 02/19/2020 0.5  0 - 0 K/uL Final  . Basophils Relative 02/19/2020 1  % Final  . Basophils Absolute 02/19/2020 0.0  0 - 0 K/uL Final  . Immature Granulocytes 02/19/2020 0  % Final  . Abs Immature Granulocytes 02/19/2020 0.01  0.00 - 0.07 K/uL Final   Performed at [facility], [address], [place], [place] [address]  . Color, Urine 02/20/2020 YELLOW  YELLOW Final  . APPearance 02/20/2020 CLEAR  CLEAR Final  . Specific Gravity, Urine 02/20/2020 1.023  1.005 - 1.030 Final  . pH 02/20/2020 6.0  5.0 - 8.0 Final  . Glucose, UA 02/20/2020 NEGATIVE  NEGATIVE mg/dL Final  . Hgb urine dipstick 02/20/2020 NEGATIVE  NEGATIVE Final  . Bilirubin Urine 02/20/2020 NEGATIVE  NEGATIVE Final  . Ketones, ur 02/20/2020 NEGATIVE  NEGATIVE mg/dL Final  . Protein, ur 86/76/1950 NEGATIVE  NEGATIVE mg/dL Final  . Nitrite 93/26/7124 NEGATIVE  NEGATIVE Final  . Glori Luis 02/20/2020 NEGATIVE  NEGATIVE Final   Performed at [facility], [address], [place], [place] [address]  .  Opiates 02/20/2020 NONE DETECTED  NONE DETECTED Final  . Cocaine 02/20/2020 NONE DETECTED  NONE DETECTED Final  . Benzodiazepines 02/20/2020 NONE DETECTED  NONE DETECTED Final  . Amphetamines 02/20/2020 NONE DETECTED  NONE DETECTED Final  . Tetrahydrocannabinol 02/20/2020 POSITIVE* NONE DETECTED Final  . Barbiturates 02/20/2020 NONE DETECTED  NONE DETECTED Final   Comment: (NOTE) DRUG SCREEN FOR MEDICAL PURPOSES ONLY.  IF CONFIRMATION IS NEEDED FOR ANY PURPOSE, NOTIFY LAB WITHIN 5 DAYS. LOWEST DETECTABLE LIMITS FOR URINE DRUG SCREEN Drug Class                     Cutoff (ng/mL) Amphetamine and metabolites    1000 Barbiturate and metabolites    200 Benzodiazepine                 200 Tricyclics and metabolites     300 Opiates and metabolites         300 Cocaine and metabolites        300 THC                            50 Performed at [facility], [address] [place], [place] [address]   Admission on 01/05/2020, Discharged on 01/08/2020  Component Date Value Ref Range Status  . SARS Coronavirus 2 by RT PCR 01/05/2020 NEGATIVE  NEGATIVE Final   Comment: (NOTE) SARS-CoV-2 target nucleic acids are NOT DETECTED. The SARS-CoV-2 RNA is generally detectable in upper respiratoy specimens during the acute phase of infection. The lowest concentration of SARS-CoV-2 viral copies this assay can detect is 131 copies/mL. A negative result does not preclude SARS-Cov-2 infection and should not be used as the sole basis for treatment or other patient management decisions. A negative result may occur with  improper specimen collection/handling, submission of specimen other than nasopharyngeal swab, presence of viral mutation(s) within the areas targeted by this assay, and inadequate number of viral copies (<131 copies/mL). A negative result must be combined with clinical observations, patient history, and epidemiological information. The expected result is Negative. Fact Sheet for Patients:  https://www.moore.com/ Fact Sheet for Healthcare Providers:  https://www.young.biz/ This test is not yet ap                          proved or cleared by the [place] FDA and  has been authorized for detection and/or diagnosis of SARS-CoV-2 by FDA under an Emergency Use Authorization (EUA). This EUA will remain  in effect (meaning this test can be used) for the duration of the COVID-19 declaration under Section 564(b)(1) of the Act, 21 U.S.C. section 360bbb-3(b)(1), unless the authorization is terminated or revoked sooner.   . Influenza A by PCR 01/05/2020 NEGATIVE  NEGATIVE Final  . Influenza B by PCR 01/05/2020 NEGATIVE  NEGATIVE Final   Comment: (NOTE) The Xpert Xpress  SARS-CoV-2/FLU/RSV assay is intended as an aid in  the diagnosis of influenza from Nasopharyngeal swab specimens and  should not be used as a sole basis for treatment. Nasal washings and  aspirates are unacceptable for Xpert Xpress SARS-CoV-2/FLU/RSV  testing. Fact Sheet for Patients: https://www.moore.com/ Fact Sheet for Healthcare Providers: https://www.young.biz/ This test is not yet approved or cleared by the [place] FDA and  has been authorized for detection and/or diagnosis of SARS-CoV-2 by  FDA under an Emergency Use Authorization (EUA). This EUA will remain  in effect (meaning this test can  be used) for the duration of the  Covid-19 declaration under Section 564(b)(1) of the Act, 21  U.S.C. section 360bbb-3(b)(1), unless the authorization is  terminated or revoked. Performed at [facility], [address], [place], [place] [address]     Allergies: Shellfish allergy  PTA Medications: (Not in a hospital admission)   Medical Decision Making   Urine drug screen is positive for marijuana, otherwise negative. Resume paliperidone  orally nightly for paranoia Resume benztropine 1 mg twice daily for EPS Resume trazodone 100 mg nightly as needed for sleep    Recommendations  Based on my evaluation the patient does not appear to have an emergency medical condition.   Patient will be placed in the continuous assessment area at [facility] for treatment and stabilization. He will be reevaluated on 05/08/2020. The treatment team will determine disposition at that time.      Jackelyn Poling, NP 05/08/20  6:18 AM

## 2020-05-08 NOTE — Progress Notes (Signed)
CSW contacted pt's ACT Team/on call staff through [facility] to notify them of pt's [facility] admission and concerns regarding: homelessness; medication noncompliance/acute substance use and mental health decompensation.  CSW spoke with [employer] Lead, [name]. She reports that they have been trying to locate [facility] for the past few days and have been working with him on housing, as he had been staying at a [employer]. They have completed TCLI (Transitional Community Living Initiative) paperwork for him--Carol contacted their housing specialist. There are no resources available this weekend. [name] suggested [facility]--CSW contacted--FULL. [name] shared that pt stays with his aunt in [place] but dislikes staying there due to there being a lot of children in the home. He prefers to sleep at a laundromat down the street.   Pt instructed to contact Wendelyn Breslow [facility] Team Lead) [phone] upon discharge from [facility] to schedule Monday 8/2 appointment to discuss housing/medications/etc.    [facility] ACT team Phone Numbers: [phone]  [place] Crisis Line: [phone] ext [phone]  [facility] OF [place]--DAY SHELTER/RESOURCES [address] [place], [place] [address] PHONE: [phone] Hours: Monday-Friday 8:00AM-3:00PM  This agency offers the following services for free:   *Integrated Care -Case management -[facility] [address] [employer] -Medical clinic -Mental health nurse -Referrals  *Fundamental Services -Showers and hygiene supplies -Laundry -Phone access -Mailing addresses and mailboxes -Replacement IDs -Onsite barbershop -Storage lockers -[employer] winter warming center  *Engineer, drilling -Skilled trade classes -Job skills classes -Resume and jobs application assistance -Interview training -GED classes -Environmental health practitioner -Financial literacy   Substance abuse resources and Residential Options:  [facility] day residential  substance abuse facility (not an option if you have active assault charges). [address] [place], [place] [address] Phone: [phone]: Ask for Drema Pry in admissions to complete intake if interested in pursuing this option.  [facility]: Can get intake scheduled; (not an option if you have active assault charges). [address] [place], [place] [phone]) Call Mon-Fri.  [facility] ([facility]): (offers outpatient therapy and intensive outpatient substance abuse therapy).  [address], [place], [place] [address] Phone: [phone]  [facility] : Offers FREE recovery skills classes, support groups, 1:1 Peer Support, and Compeer Classes. [address], [place], [place] [address] Phone: [phone] (Call to complete intake).   [facility] [address] [place], [place] [address] Phone: [phone] ext [phone]  [facility] provides food, shelter and other programs and services to the homeless men of [place] through our [employer] program.  By offering safe shelter, three meals a day, clean clothing, Biblical counseling, financial planning, vocational training, GED/education and employment assistance, we've helped mend the shattered lives of many homeless men since opening in 1974.  We have approximately 267 beds available, with a max of 312 beds including mats for emergency situations and currently house an average of 270 men a night.  Prospective Client Check-In Information Photo ID Required (State/ Out of State/ [facility]) - if photo ID is not available, clients are required to have a printout of a police/sheriff's criminal history report. Help out with chores around the [facility]. No sex offender of any type (pending, charged, registered and/or any other sex related offenses) will be permitted to check in. Must be willing to abide by all rules, regulations, and policies established by the  [employer]. The following will be provided - shelter, food, clothing, and biblical counseling. If you or someone you know is in need of assistance  at our [facility] shelter in [place], [place], please call [phone] ext. [phone].   Kane Kusek S. Alan Ripper, MSW, LCSW Clinical Social Worker 05/08/2020 10:00 AM

## 2020-05-08 NOTE — BH Assessment (Addendum)
Comprehensive Clinical Assessment (CCA) Note  05/08/2020 [name] [id number]  Pt is a 29 year old single male who presents unaccompanied to [facility] after contacting law enforcement and requesting assistance. Pt has a diagnosis of schizophrenia and cannabis use. He has been evaluated several times in the past for mental health symptoms including disorganized thought process, auditory hallucinations and paranoia. He also has a history of not taking medications in an outpatient setting. Today he reports he feels paranoid and "scared for my life." He states that unknown people are trying to harm him and that "things are weird when you're outside." He says he has not been sleeping because he cannot sleep when he is around people. He states his appetite is poor because a girl hurt his feelings. Pt answers some questions freely but at other times appears anxious and states, "I don't know." He denies auditory or visual hallucinations but during assessment appears to be experiencing thought blocking or stares and appears distracted. He denies current suicidal ideation and says he wants to protect his life. He denies thoughts of harming others and has no known history of aggression.   Pt reports using marijuana daily when available. He also reports drinking alcohol but is unable to give details of use. He says he has used cocaine in the past but not recently. He denies use of other substances.  Pt reports he is currently homeless and living on the street. He cannot identify any family or friends who are supportive, stating that he cannot trust people. He says he feels he needs a complete evaluation. He denies any current legal problems. He denies access to firearms. He says he has no outpatient mental health providers and doesn't take any medication. Pt's medical record indicates he was last psychiatrically [facility] at [facility] 05/16-05/18/2021.  Pt cannot identify anyone to contact for collateral  information.  Pt is casually dressed and well-groomed. He is alert and oriented x4. Pt speaks in a clear tone, at moderate volume and normal pace. Motor behavior appears normal. Eye contact is good but at times stares. Pt's mood is anxious and affect is congruent with mood. Thought process is coherent but at times tangential. Insight and judgment are impaired. Pt was cooperative throughout assessment. He says he willing to stay overnight. When asked if he would take medication to help with paranoia, Pt states he will take "a chill pill."   Visit Diagnosis:    F20.9 Schizophrenia  F12.20 Cannabis use disorder, Moderate   DISPOSITION: [name], FNP completed MSE and recommended Pt be admitted to Continuous Assessment for further evaluation.  PHQ9 SCORE ONLY 05/08/2020  PHQ-9 Total Score 6     CCA Screening, Triage and Referral (STR)  Patient Reported Information How did you hear about [place]? Self  Referral name: Pt contacted law enforcement requesting help.  Referral phone number: No data recorded  Whom do you see for routine medical problems? I don't have a doctor  Practice/Facility Name: No data recorded Practice/Facility Phone Number: No data recorded Name of Contact: No data recorded Contact Number: No data recorded Contact Fax Number: No data recorded Prescriber Name: No data recorded Prescriber Address (if known): No data recorded  What Is the Reason for Your Visit/Call Today? Pt states he is paranoid and "scared for my life"  How Long Has This Been Causing You Problems? > than 6 months  What Do You Feel Would Help You the Most Today? Medication   Have You Recently Been in Any Inpatient Treatment (Hospital/Detox/Crisis Center/28-Day  Program)? Yes  Name/Location of Program/Hospital:[facility]  How Long Were You There? 2 days  When Were You Discharged? 02/24/20   Have You Ever Received Services From [facility] Before? Yes  Who [facility] You [facility] at [facility]   Have You Recently Had Any Thoughts About Hurting Yourself? No  Are You Planning to Commit Suicide/Harm Yourself At This time? No   Have you Recently Had Thoughts About Hurting Someone Karolee Ohslse? No  Explanation: No data recorded  Have You Used Any Alcohol or Drugs in the Past 24 Hours? Yes  How Long Ago Did You Use Drugs or Alcohol? 1200  What Did You Use and How Much? Alcohol and marijuana, amount unknown   Do You Currently Have a Therapist/Psychiatrist? No  Name of Therapist/Psychiatrist: No data recorded  Have You Been Recently Discharged From Any Office Practice or Programs? No  Explanation of Discharge From Practice/Program: No data recorded    CCA Screening Triage Referral Assessment Type of Contact: Face-to-Face  Is this Initial or Reassessment? No data recorded Date Telepsych consult ordered in [facility]:  No data recorded Time Telepsych consult ordered in [facility]:  No data recorded  Patient Reported Information Reviewed? Yes  Patient Left Without Being Seen? No data recorded Reason for Not Completing Assessment: TTS cart currently in use, 2 pt's ahead of this consult. TTS to assess pt when cart is available    Collateral Involvement: None   Does Patient Have a Autom[employer] engineerCourt Appointed Legal Guardian? No data recorded Name and Contact of Legal Guardian: self  If Minor and Not Living with Parent(s), Who has Custody? No data recorded Is CPS involved or ever been involved? No data recorded Is APS involved or ever been involved? No data recorded  Patient Determined To Be At Risk for Harm To Self or Others Based on Review of Patient Reported Information or Presenting Complaint? No  Method: No data recorded Availability of Means: No data recorded Intent: No data recorded Notification Required: No data recorded Additional Information for Danger to Others Potential: No data recorded Additional Comments for Danger to Others Potential: No data recorded Are There Guns or Other  Weapons in Your Home? No data recorded Types of Guns/Weapons: No data recorded Are These Weapons Safely Secured?                            No data recorded Who Could Verify You Are Able To Have These Secured: No data recorded Do You Have any Outstanding Charges, Pending Court Dates, Parole/Probation? No data recorded Contacted To Inform of Risk of Harm To Self or Others: Unable to Contact:   [facility] Assessment[facility]   Does Patient Present under Involuntary Commitment? No  IVC Papers Initial File Date: No data recorded  [place]   Patient Currently Receiving the Following Services: Not Receiving Services   Determination of Need: Urgent (48 hours)   Options For Referral: [facility]     CCA Biopsychosocial  Intake/Chief Complaint:  CCA Intake With Chief Complaint CCA Part Two Date: 05/08/20 CCA Part Two Time: 0400 Chief Complaint/Presenting Problem: Pt has diagnosis of schizophrenia and reports feeling paranoid and fearful. Patient's Currently Reported Symptoms/Problems: Pt reports decreased sleep, decreased appetite and is fearful that unknown people are trying to harm him Individual's Strengths: Pt appears physically health and cooperative Individual's Preferences: Pt states he wants to be in a safe place. Type of  Services Patient Feels Are Needed: Pt says he needs a thorough examination. Initial Clinical Notes/Concerns: Pt is a poor historian  Mental Health Symptoms Depression:  Depression: Difficulty Concentrating, Increase/decrease in appetite, Sleep (too much or little), Duration of symptoms greater than two weeks  Mania:  Mania: None  Anxiety:   Anxiety: Difficulty concentrating, Fatigue, Sleep, Tension, Worrying  Psychosis:  Psychosis: Duration of symptoms greater than six months, Delusions  Trauma:  Trauma: None  Obsessions:  Obsessions: None  Compulsions:  Compulsions: None  Inattention:  Inattention: None   Hyperactivity/Impulsivity:  Hyperactivity/Impulsivity: N/A  Oppositional/Defiant Behaviors:  Oppositional/Defiant Behaviors: N/A  Emotional Irregularity:  Emotional Irregularity: N/A  Other Mood/Personality Symptoms:      Mental Status Exam Appearance and self-care  Stature:  Stature: Average  Weight:  Weight: Average weight  Clothing:  Clothing: Casual  Grooming:  Grooming: Well-groomed  Cosmetic use:  Cosmetic Use: None  Posture/gait:  Posture/Gait: Normal  Motor activity:  Motor Activity: Not Remarkable  Sensorium  Attention:  Attention: Distractible  Concentration:  Concentration: Variable  Orientation:  Orientation: X5  Recall/memory:  Recall/Memory: Defective in Remote  Affect and Mood  Affect:  Affect: Anxious  Mood:  Mood: Anxious  Relating  Eye contact:  Eye Contact: Normal, Staring  Facial expression:  Facial Expression: Anxious  Attitude toward examiner:  Attitude Toward Examiner: Cooperative  Thought and Language  Speech flow: Speech Flow: Other (Comment) (Pt speech appears blocked at times.)  Thought content:  Thought Content: Delusions  Preoccupation:  Preoccupations: None  Hallucinations:  Hallucinations: Other (Comment) (Pt denies but possibly responding to internal stimuli)  Organization:     Company secretary of Knowledge:  Fund of Knowledge: Average  Intelligence:  Intelligence: Average  Abstraction:  Abstraction: Functional  Judgement:  Judgement: Impaired  Reality Testing:  Reality Testing: Distorted  Insight:  Insight: Lacking  Decision Making:  Decision Making: Only simple  Social Functioning  Social Maturity:  Social Maturity: Isolates  Social Judgement:  Social Judgement: Normal  Stress  Stressors:  Stressors: Relationship, Surveyor, quantity, Housing  Coping Ability:  Coping Ability: Deficient supports  Skill Deficits:  Skill Deficits: Decision making  Supports:  Supports: Support needed     Religion:    Leisure/Recreation:     Exercise/Diet: Exercise/Diet Do You Follow a Special Diet?: No Do You Have Any Trouble Sleeping?: Yes Explanation of Sleeping Difficulties: Pt reports he cannot sleep when people are around.   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 What is the longest time patient has a held a job?: "For a few" Where was the patient employed at that time?: [employer] Has patient ever been in the [employer]?: No  Education: Education Is Patient Currently Attending School?: No   CCA Family/Childhood History  Family and Relationship History: Family history Marital status: Single What is your sexual orientation?: Heterosexual Does patient have children?: No  Childhood History:  Childhood History By whom was/is the patient raised?: Grandparents Did patient suffer any verbal/emotional/physical/sexual abuse as a child?: No Did patient suffer from severe childhood neglect?: No Has patient ever been sexually abused/assaulted/raped as an adolescent or adult?: No Was the patient ever a victim of a crime or a disaster?: No Witnessed domestic violence?: No Has patient been affected by domestic violence as an adult?: No  Child/Adolescent Assessment:     CCA Substance Use  Alcohol/Drug  Use: Alcohol / Drug Use Pain Medications: See MAR Prescriptions: See MAR Over the Counter: See MAR History of alcohol / drug use?: Yes Negative Consequences of Use: Personal relationships Substance #1 Name of Substance 1: Marijuana 1 - Age of First Use: unknown 1 - Amount (size/oz): unknown 1 - Frequency: daily when available 1 - Duration: ongoing 1 - Last Use / Amount: 05/08/2020 Substance #2 Name of Substance 2: Alcohol 2 - Age of First Use: unknown 2 - Amount (size/oz): unknown 2 - Frequency: unknown 2 -  Duration: unknown 2 - Last Use / Amount: unknown                      ASAM's:  Six Dimensions of Multidimensional Assessment  Dimension 1:  Acute Intoxication and/or Withdrawal Potential:      Dimension 2:  Biomedical Conditions and Complications:      Dimension 3:  Emotional, Behavioral, or Cognitive Conditions and Complications:     Dimension 4:  Readiness to Change:     Dimension 5:  Relapse, Continued use, or Continued Problem Potential:     Dimension 6:  Recovery/Living Environment:     ASAM Severity Score:    ASAM Recommended Level of Treatment:     Substance use Disorder (SUD)    Recommendations for Services/Supports/Treatments:    DSM5 Diagnoses: Patient Active Problem List   Diagnosis Date Noted  . Schizophrenia, paranoid type (HCC) 02/14/2018  . Cannabis use disorder, moderate, dependence (HCC) 05/10/2016    Patient Centered Plan: Patient is on the following Treatment Plan(s):     Referrals to Alternative Service(s): Referred to Alternative Service(s):   Place:   Date:   Time:    Referred to Alternative Service(s):   Place:   Date:   Time:    Referred to Alternative Service(s):   Place:   Date:   Time:    Referred to Alternative Service(s):   Place:   Date:   Time:     Pamalee Leyden, [facility], [facility] Triage Specialist [phone]  Patsy Baltimore, Harlin Rain

## 2020-05-08 NOTE — ED Notes (Signed)
Pt A&O x 4, presents with auditory hallucinations and paranoia.  Pt feels that unknown people are trying to harm him.  Denies SI or HI.  Skin assessment completed.  Monitoring for safety, pt calm & cooperative.

## 2020-05-08 NOTE — ED Notes (Signed)
Pt. Given breakfast; muffin, granola bar, orange juice

## 2020-05-08 NOTE — Discharge Instructions (Signed)
CALL CAROL EAST ([place] TEAM LEAD) IMMEDIATELY TO SCHEDULE APPT FOR Monday 8/2. SHE HAS BEEN TRYING TO REACH YOU FOR THE PAST FEW DAYS AND IS TRYING TO ASSIST YOU WITH HOUSING SUPPORRT.   [facility] ACT team Phone Numbers: [phone]  [place] Crisis Line: [phone] ext [phone]  [facility] OF [place]--DAY SHELTER/RESOURCES [address] [place], [place] [address] PHONE: [phone] Hours: Monday-Friday 8:00AM-3:00PM  This agency offers the following services for free:   *Integrated Care -Case management -[facility] [employer] -Medical clinic -Mental health nurse -Referrals  *Fundamental Services -Showers and hygiene supplies -Laundry -Phone access -Mailing addresses and mailboxes -Replacement IDs -Onsite barbershop -Storage lockers -[employer] winter warming center  *Engineer, drilling -Skilled trade classes -Job skills classes -Resume and jobs application assistance -Interview training -GED classes -Environmental health practitioner -Financial literacy   Substance abuse resources and Residential Options:  [facility]-14 day residential substance abuse facility (not an option if you have active assault charges). [address] [place], [place] [address] Phone: [phone]: Ask for Drema Pry in admissions to complete intake if interested in pursuing this option.  [facility]: Can get intake scheduled; (not an option if you have active assault charges). [address] [place], [place] [phone]) Call Mon-Fri.  [facility] ([facility]): (offers outpatient therapy and intensive outpatient substance abuse therapy).  [address] [place], [place] [address] Phone: [phone]  [facility] [place]: Offers FREE recovery skills classes, support groups, 1:1 Peer Support, and Compeer Classes. [address] [place], [place] [address] Phone: [phone] (Call to complete intake).   [facility] [address] [place], [place] [address] Phone: [phone] ext [phone]  [facility] provides food, shelter and other programs and services to the homeless men of [place] through our [employer] program.  By offering safe shelter, three meals a day, clean clothing, Biblical counseling, financial planning, vocational training, GED/education and employment assistance, we've helped mend the shattered lives of many homeless men since opening in 1974.  We have approximately 267 beds available, with a max of 312 beds including mats for emergency situations and currently house an average of 270 men a night.  Prospective Client Check-In Information Photo ID Required (State/ Out of State/ [facility]) - if photo ID is not available, clients are required to have a printout of a police/sheriff's criminal history report. Help out with chores around the [facility]. No sex offender of any type (pending, charged, registered and/or any other sex related offenses) will be permitted to check in. Must be willing to abide by all rules, regulations, and policies established by the [employer]. The following will be provided - shelter, food, clothing, and biblical counseling. If you or someone you know is in need of assistance at our [facility] shelter in [place], [place], please call [phone] ext. [phone].

## 2020-05-08 NOTE — ED Notes (Signed)
Pt discharged in no acute distress. Verbalized understanding of all discharge instructions reviewed by staff. Denies SI/HI, A/VH upon discharge. All belongings returned to pt intact. Bus pass given for transport to home. Pt escorted to lobby via staff. Safety maintained.

## 2020-05-24 ENCOUNTER — Other Ambulatory Visit: Payer: Self-pay

## 2020-05-24 ENCOUNTER — Ambulatory Visit (HOSPITAL_COMMUNITY)
Admission: EM | Admit: 2020-05-24 | Discharge: 2020-05-25 | Disposition: A | Discharge status: Home / Self Care | Payer: Medicaid Other | Attending: Nurse Practitioner | Admitting: Nurse Practitioner

## 2020-05-24 DIAGNOSIS — Z20822 Contact with and (suspected) exposure to covid-19: Secondary | ICD-10-CM | POA: Diagnosis not present

## 2020-05-24 DIAGNOSIS — F209 Schizophrenia, unspecified: Secondary | ICD-10-CM | POA: Diagnosis not present

## 2020-05-24 DIAGNOSIS — F329 Major depressive disorder, single episode, unspecified: Secondary | ICD-10-CM | POA: Insufficient documentation

## 2020-05-24 DIAGNOSIS — F17210 Nicotine dependence, cigarettes, uncomplicated: Secondary | ICD-10-CM | POA: Insufficient documentation

## 2020-05-24 MED ORDER — MAGNESIUM HYDROXIDE 400 MG/5ML PO SUSP: 30.0000 mL | Freq: Every day | ORAL | Status: DC | PRN

## 2020-05-24 MED ORDER — ALUM & MAG HYDROXIDE-SIMETH 200-200-20 MG/5ML PO SUSP: 30.0000 mL | ORAL | Status: DC | PRN

## 2020-05-24 MED ORDER — ACETAMINOPHEN 325 MG PO TABS: 650.0000 mg | ORAL_TABLET | Freq: Four times a day (QID) | ORAL | Status: DC | PRN

## 2020-05-24 MED ORDER — TRAZODONE HCL 50 MG PO TABS: 50.0000 mg | ORAL_TABLET | Freq: Every evening | ORAL | Status: DC | PRN

## 2020-05-24 MED ORDER — HYDROXYZINE HCL 25 MG PO TABS: 25.0000 mg | ORAL_TABLET | Freq: Three times a day (TID) | ORAL | Status: DC | PRN

## 2020-05-24 NOTE — BH Assessment (Addendum)
Comprehensive Clinical Assessment (CCA) Note  05/25/2020 [name] [id number]   [name] is a 29 year old male who presents voluntary and unaccompanied to [facility]. Pt was a poor historian during the assessment and answered most questions, "no, I don't know." Clinician asked the pt, "what brought you to the hospital?" Pt reported, "I don't feel right, I need a shot to make me feel right, to help me move, get up." Clinician observed pt moving as he was speaking. Pt reported, he wants to sleep. Pt denies, SI, HI, AVH, self-injurious behaviors and access to weapons.   Pt denies, substance use. Pt denies, being linked to OPT resources (medication management and/or counseling) however per chart pt has an ACT Team through [facility]. Pt denies, prescribed medications.   Pt presents quiet, awake with blocked speech. Pt's eye contact was staring off at times. Pt's mood, affect was anxious. Pt's was oriented x2. Pt's judgement was impaired. Pt's insight was poor. During the assessment pt appeared to be responding to internal stimuli. Clinician asked the pt if he could contract for safety, if discharged pt replied, "I don't know."   Disposition: [name], NP recommends pt to be admitted to Continuing Observation Unit. Discussed with Joanie Coddington, RN.   Diagnosis: Schizophrenia.   Visit Diagnosis:   No diagnosis found.    CCA Screening, Triage and Referral (STR)  Patient Reported Information How did you hear about [place]? Other (Comment) (EMS.)  Referral name: Pt contacted law enforcement requesting help.  Referral phone number: No data recorded  Whom do you see for routine medical problems? I don't have a doctor  Practice/Facility Name: No data recorded Practice/Facility Phone Number: No data recorded Name of Contact: No data recorded Contact Number: No data recorded Contact Fax Number: No data recorded Prescriber Name: No data recorded Prescriber Address (if known): No data recorded  What Is  the Reason for Your Visit/Call Today? Pt states he is paranoid and "scared for my life"  How Long Has This Been Causing You Problems? > than 6 months  What Do You Feel Would Help You the Most Today? Medication   Have You Recently Been in Any Inpatient Treatment (Hospital/Detox/Crisis Center/28-Day Program)? Yes  Name/Location of Program/Hospital:[facility]  How Long Were You There? Per chart, "2 days."  When Were You Discharged? 02/24/20 (Per chart.)   Have You Ever Received Services From [facility] Before? No  [facility]   Have You Recently Had Any Thoughts About Hurting Yourself? No  Are You Planning to Commit Suicide/Harm Yourself At This time? No   Have you Recently Had Thoughts About Hurting Someone [name]? No  Explanation: No data recorded  Have You Used Any Alcohol or Drugs in the Past 24 Hours? No  How Long Ago Did You Use Drugs or Alcohol? 1200  What Did You Use and How Much? Pt denies.   Do You Currently Have a Therapist/Psychiatrist? Yes  Name of Therapist/Psychiatrist: Per chart, pt has an ACT Team however pt currently denies.   Have You Been Recently Discharged From Any Office Practice or Programs? No  Explanation of Discharge From Practice/Program: No data recorded    CCA Screening Triage Referral Assessment Type of Contact: Face-to-Face  Is this Initial or Reassessment? No data recorded Date Telepsych consult ordered in [facility]:  No data recorded Time Telepsych consult ordered in [facility]:  No data recorded  Patient Reported Information Reviewed? Yes  Patient Left Without Being Seen? No data recorded Reason  for Not Completing Assessment: TTS cart currently in use, 2 pt's ahead of this consult. TTS to assess pt when cart is available    Collateral Involvement: None.   Does Patient Have a Automotive engineer Guardian? No data recorded Name and Contact of Legal Guardian: self  If Minor and Not Living with Parent(s), Who  has Custody? No data recorded Is CPS involved or ever been involved? In the Past  Is APS involved or ever been involved? No data recorded  Patient Determined To Be At Risk for Harm To Self or Others Based on Review of Patient Reported Information or Presenting Complaint? No  Method: No data recorded Availability of Means: No data recorded Intent: No data recorded Notification Required: No data recorded Additional Information for Danger to Others Potential: No data recorded Additional Comments for Danger to Others Potential: No data recorded Are There Guns or Other Weapons in Your Home? No data recorded Types of Guns/Weapons: No data recorded Are These Weapons Safely Secured?                            No data recorded Who Could Verify You Are Able To Have These Secured: No data recorded Do You Have any Outstanding Charges, Pending Court Dates, Parole/Probation? No data recorded Contacted To Inform of Risk of Harm To Self or Others: Unable to Contact:   Location of Assessment: [facility]   Does Patient Present under Involuntary Commitment? No  IVC Papers Initial File Date: No data recorded  [place] of Residence: [place]   Patient Currently Receiving the Following Services: ACTT Psychologist, educational)   Determination of Need: Urgent (48 hours)   Options For Referral: Other: Comment;Inpatient Hospitalization;Medication Management     CCA Biopsychosocial  Intake/Chief Complaint:  CCA Intake With Chief Complaint CCA Part Two Date: 05/24/20 CCA Part Two Time: 2328 Chief Complaint/Presenting Problem: Ptreported, he does not feel right and needs a shot. Patient's Currently Reported Symptoms/Problems: Pt reported, wanting to sleep, and is in need of medication. Individual's Strengths: UTA Individual's Preferences: UTA Type of Services Patient Feels Are Needed: Pt reported, needing medication. Initial Clinical Notes/Concerns: Pt is a poor  historian  Mental Health Symptoms Depression:  Depression: Difficulty Concentrating  Mania:  Mania: None  Anxiety:   Anxiety: Difficulty concentrating  Psychosis:  Psychosis:  (Pt appears to be responding to internal stimuli.)  Trauma:  Trauma: None  Obsessions:  Obsessions: None  Compulsions:  Compulsions: None  Inattention:  Inattention: None  Hyperactivity/Impulsivity:  Hyperactivity/Impulsivity: N/A  Oppositional/Defiant Behaviors:  Oppositional/Defiant Behaviors: N/A  Emotional Irregularity:  Emotional Irregularity: N/A  Other Mood/Personality Symptoms:      Mental Status Exam Appearance and self-care  Stature:  Stature: Average  Weight:  Weight: Average weight  Clothing:  Clothing: Casual  Grooming:  Grooming: Normal  Cosmetic use:  Cosmetic Use: None  Posture/gait:  Posture/Gait: Normal  Motor activity:  Motor Activity: Not Remarkable  Sensorium  Attention:  Attention: Distractible  Concentration:  Concentration: Variable  Orientation:  Orientation: Person, Place  Recall/memory:  Recall/Memory: Defective in Remote, Defective in Recent  Affect and Mood  Affect:  Affect: Anxious  Mood:  Mood: Anxious  Relating  Eye contact:  Eye Contact: Staring  Facial expression:  Facial Expression: Anxious  Attitude toward examiner:  Attitude Toward Examiner: Cooperative  Thought and Language  Speech flow: Speech Flow: Blocked  Thought content:  Thought Content:  (UTA)  Preoccupation:  Preoccupations:  None  Hallucinations:  Hallucinations: Other (Comment) (Pt denies but possibly responding to internal stimuli)  Organization:     Company secretary of Knowledge:  Fund of Knowledge: Poor  Intelligence:  Intelligence: Average  Abstraction:  Abstraction:  Industrial/product designer)  Judgement:  Judgement: Impaired  Reality Testing:  Reality Testing:  (UTA)  Insight:  Insight: Poor  Decision Making:  Decision Making: Confused  Social Functioning  Social Maturity:  Social Maturity:  Industrial/product designer)   Social Judgement:  Social Judgement:  (UTA)  Stress  Stressors:  Stressors: Housing  Coping Ability:  Coping Ability:  Industrial/product designer)  Skill Deficits:  Skill Deficits: Decision making  Supports:  Supports:  Industrial/product designer)     Religion: Religion/Spirituality Are You A Religious Person?:  Industrial/product designer)  Leisure/Recreation: Leisure / Recreation Do You Have Hobbies?:  (UTA)  Exercise/Diet: Exercise/Diet Do You Exercise?:  (UTA) Do You Follow a Special Diet?: No Do You Have Any Trouble Sleeping?: Yes   CCA Employment/Education  Employment/Work Situation: Employment / Work Situation Employment situation:  Industrial/product designer) Has patient ever been in the [employer]?: No  Education: Education Is Patient Currently Attending School?: No Last Grade Completed:  (Pt reported, 9th or 10th grade.) Did Garment/textile technologist From [employer]?: No Did You Product manager?: No Did Designer, television/film set?: No   CCA Family/Childhood History  Family and Relationship History: Family history Marital status: Single Does patient have children?: No  Childhood History:  Childhood History Additional childhood history information: NA Description of patient's relationship with caregiver when they were a child: NA Patient's description of current relationship with people who raised him/her: NA How were you disciplined when you got in trouble as a child/adolescent?: NA Did patient suffer any verbal/emotional/physical/sexual abuse as a child?: No Did patient suffer from severe childhood neglect?: No Has patient ever been sexually abused/assaulted/raped as an adolescent or adult?: No Was the patient ever a victim of a crime or a disaster?: No Witnessed domestic violence?: No Has patient been affected by domestic violence as an adult?: No  Child/Adolescent Assessment:     CCA Substance Use  Alcohol/Drug Use: Alcohol / Drug Use Pain Medications: See MAR Prescriptions: See MAR Over the Counter: See MAR History of alcohol / drug  use?: No history of alcohol / drug abuse (Pt denies.)       ASAM's:  Six Dimensions of Multidimensional Assessment  Dimension 1:  Acute Intoxication and/or Withdrawal Potential:      Dimension 2:  Biomedical Conditions and Complications:      Dimension 3:  Emotional, Behavioral, or Cognitive Conditions and Complications:     Dimension 4:  Readiness to Change:     Dimension 5:  Relapse, Continued use, or Continued Problem Potential:     Dimension 6:  Recovery/Living Environment:     ASAM Severity Score:    ASAM Recommended Level of Treatment:     Substance use Disorder (SUD)    Recommendations for Services/Supports/Treatments: Recommendations for Services/Supports/Treatments Recommendations For Services/Supports/Treatments: Other (Comment) (Continuing Observation Unit.)  DSM5 Diagnoses: Patient Active Problem List   Diagnosis Date Noted  . Schizophrenia, paranoid type (HCC) 02/14/2018  . Cannabis use disorder, moderate, dependence (HCC) 05/10/2016     Referrals to Alternative Service(s): Referred to Alternative Service(s):   Place:   Date:   Time:    Referred to Alternative Service(s):   Place:   Date:   Time:    Referred to Alternative Service(s):   Place:   Date:   Time:    Referred  to Alternative Service(s):   Place:   Date:   Time:     Redmond Pulling  Comprehensive Clinical Assessment (CCA) Screening, Triage and Referral Note  05/25/2020 [name] [id number]  Visit Diagnosis: No diagnosis found.  Patient Reported Information How did you hear about [place]? Other (Comment) (EMS.)   Referral name: Pt contacted law enforcement requesting help.   Referral phone number: No data recorded Whom do you see for routine medical problems? I don't have a doctor   Practice/Facility Name: No data recorded  Practice/Facility Phone Number: No data recorded  Name of Contact: No data recorded  Contact Number: No data recorded  Contact Fax Number: No data  recorded  Prescriber Name: No data recorded  Prescriber Address (if known): No data recorded What Is the Reason for Your Visit/Call Today? Pt states he is paranoid and "scared for my life"  How Long Has This Been Causing You Problems? > than 6 months  Have You Recently Been in Any Inpatient Treatment (Hospital/Detox/Crisis Center/28-Day Program)? Yes   Name/Location of Program/Hospital:[facility]   How Long Were You There? Per chart, "2 days."   When Were You Discharged? 02/24/20 (Per chart.)  Have You Ever Received Services From [facility] Before? No   Who Do You See at [name]? [facility].  Have You Recently Had Any Thoughts About Hurting Yourself? No   Are You Planning to Commit Suicide/Harm Yourself At This time?  No  Have you Recently Had Thoughts About Hurting Someone [name]? No   Explanation: No data recorded Have You Used Any Alcohol or Drugs in the Past 24 Hours? No   How Long Ago Did You Use Drugs or Alcohol?  1200   What Did You Use and How Much? Pt denies.  What Do You Feel Would Help You the Most Today? Medication  Do You Currently Have a Therapist/Psychiatrist? Yes   Name of Therapist/Psychiatrist: Per chart, pt has an ACT Team however pt currently denies.   Have You Been Recently Discharged From Any Office Practice or Programs? No   Explanation of Discharge From Practice/Program:  No data recorded    CCA Screening Triage Referral Assessment Type of Contact: Face-to-Face   Is this Initial or Reassessment? No data recorded  Date Telepsych consult ordered in [facility]:  No data recorded  Time Telepsych consult ordered in [facility]:  No data recorded Patient Reported Information Reviewed? Yes   Patient Left Without Being Seen? No data recorded  Reason for Not Completing Assessment: TTS cart currently in use, 2 pt's ahead of this consult. TTS to assess pt when cart is available   Collateral Involvement: None.  Does Patient Have a Automotive engineer Guardian? No  data recorded  Name and Contact of Legal Guardian:  self  If Minor and Not Living with Parent(s), Who has Custody? No data recorded Is CPS involved or ever been involved? In the Past  Is APS involved or ever been involved? No data recorded Patient Determined To Be At Risk for Harm To Self or Others Based on Review of Patient Reported Information or Presenting Complaint? No   Method: No data recorded  Availability of Means: No data recorded  Intent: No data recorded  Notification Required: No data recorded  Additional Information for Danger to Others Potential:  No data recorded  Additional Comments for Danger to Others Potential:  No data recorded  Are There Guns or Other Weapons in Your Home?  No data recorded   Types of Guns/Weapons:  No data recorded   Are These Weapons Safely Secured?                              No data recorded   Who Could Verify You Are Able To Have These Secured:    No data recorded Do You Have any Outstanding Charges, Pending Court Dates, Parole/Probation? No data recorded Contacted To Inform of Risk of Harm To Self or Others: Unable to Contact:  [facility]  Does Patient Present under Involuntary Commitment? No   IVC Papers Initial File Date: No data recorded  [place]  Patient Currently Receiving the Following Services: ACTT Psychologist, educational([facility])   Determination of Need: Urgent (48 hours)   Options For Referral: Other: Comment;Inpatient Hospitalization;Medication Management   Redmond Pullingreylese D Izza Bickle, [facility]    Redmond Pullingreylese D Cynai Skeens, MS, [facility], [facility] Triage Specialist [phone]

## 2020-05-24 NOTE — BH Assessment (Signed)
Clinician attempted to call pt's guardian, [name], [phone], however the phone rang once then received a error message. Clinician unable to leave HIPPA complaint voice message.    Redmond Pulling, MS, [facility], [facility] Triage Specialist [phone]

## 2020-05-24 NOTE — ED Provider Notes (Signed)
Behavioral Health Admission [facility](FBC & OBS)  Date: 05/25/20 Patient Name: [name] MRN: [id number] Chief Complaint:  Chief Complaint  Patient presents with  . Schizophrenia   Chief Complaint/Presenting Problem: Ptreported, he does not feel right and needs a shot.  Diagnoses:  Final diagnoses:  Schizophrenia, unspecified type (HCC)    HPI: Adrian GivensMarlo Warner is a 29 y.o. male with a history of schizophrenia who [facility] to [facility] voluntarily with law enforcement.  Patient initially states that he does not know [facility]. He later reports that he called 911 and requested that they take him to the behavioral health hospital.  He states that he feels safe at the behavioral health hospital.  Patient mostly responds "no" or "I don't know" to most questions.  He denies suicidal ideations.  He denies homicidal ideations.  Patient denies auditory and visual hallucinations.  However, he appears to be responding to internal stimuli.  Patient looks at various points in the room and laughs.  He states that he does not feel safe outside.  When asked why he does not feel safe he states "I don't know, I am trying to figure that out."  Patient initially responded no, when asked if he has an ACT Team.  When asked when was the last time that he saw him that he saw his ACT Team he states "I don't know, I am trying to figure that out."  Chart review indicates that the patient receives ACT services though [facility].  Patient states that he does not take any medications.  He denies substance use.  His urine drug screen is positive for marijuana. [facility] inpatient [facility] March 2021, May 2021, April 2019, May 2019.   PHQ 2-9:    [facility]  Thoughts that you would be better off dead, or of hurting yourself in some way Not at all  PHQ-9 Total Score 6        [facility]) [facility] Admission (Discharged) from [facility] ED from 11/15/2018 in [facility]  C-SSRS RISK CATEGORY Error: Question 2 not populated No Risk No Risk       Total Time spent with patient: 30 minutes  Musculoskeletal  Strength & Muscle Tone: within normal limits Gait & Station: normal Patient leans: N/A  Psychiatric Specialty Exam  Presentation General Appearance: Casual;Neat  Eye Contact:Minimal  Speech:Normal Rate  Speech Volume:Normal  Handedness:Right   Mood and Affect  Mood:Euthymic  Affect:Restricted   Thought Process  Thought Processes:Coherent  Descriptions of Associations:Intact  Orientation:Full (Time, Place and Person)  Thought Content:Illogical;Paranoid Ideation  Hallucinations:Hallucinations: Other (comment) (denies, appears to be responding to internal stimuli)  Ideas of Reference:Paranoia  Suicidal Thoughts:Suicidal Thoughts: No  Homicidal Thoughts:Homicidal Thoughts: No   Sensorium  Memory:Immediate Poor;Recent Poor;Remote Poor  Judgment:Impaired  Insight:Lacking   Executive Functions  Concentration:Poor  Attention Span:Poor  Recall:Poor  Fund of Knowledge:Poor  Language:Fair   Psychomotor Activity  Psychomotor Activity:Psychomotor Activity: Normal   Assets  Assets:Desire for Improvement;Social Support;Financial Resources/Insurance   Sleep  Sleep:Sleep: Good   Physical Exam Constitutional:      General: He is not in acute distress.    Appearance: He is not ill-appearing, toxic-appearing or diaphoretic.  HENT:     Head: Normocephalic.     Right Ear: External ear normal.     Left Ear: External  ear normal.  Eyes:     Pupils: Pupils are equal, round, and reactive to light.  Cardiovascular:     Rate and Rhythm: Normal rate.  Pulmonary:     Effort: Pulmonary effort is normal. No respiratory distress.  Musculoskeletal:        General:  Normal range of motion.  Skin:    General: Skin is warm and dry.  Neurological:     Mental Status: He is alert and oriented to person, place, and time.  Psychiatric:        Speech: Speech normal.        Behavior: Behavior is cooperative.        Thought Content: Thought content is paranoid. Thought content is not delusional. Thought content does not include homicidal or suicidal ideation. Thought content does not include suicidal plan.    Review of Systems  Constitutional: Negative for chills, diaphoresis, fever, malaise/fatigue and weight loss.  HENT: Negative.   Respiratory: Negative for cough and shortness of breath.   Cardiovascular: Negative for chest pain.  Gastrointestinal: Negative for diarrhea, nausea and vomiting.  Genitourinary: Negative.   Musculoskeletal: Negative.   Skin: Negative.   Neurological: Negative.   Psychiatric/Behavioral: Positive for substance abuse. The patient is nervous/anxious and has insomnia.     Blood pressure (!) 144/85, pulse 64, temperature (!) 97.5 F (36.4 C), temperature source Tympanic, resp. rate 18, height 5' 8.11" (1.73 m), weight 147 lb (66.7 kg), SpO2 100 %. Body mass index is 22.28 kg/m.  Past Psychiatric History: Schizophrenia.  He was inpatient at behavioral health [facility] March 2021, May 2021, April 2019, May 2019.  Is the patient at risk to self? No  Has the patient been a risk to self in the past 6 months? Yes .    Has the patient been a risk to self within the distant past? No   Is the patient a risk to others? No   Has the patient been a risk to others in the past 6 months? No   Has the patient been a risk to others within the distant past? No   Past Medical History:  Past Medical History:  Diagnosis Date  . Depression   . Schizophrenia [facility])     Past Surgical History:  Procedure Laterality Date  . BACK SURGERY      Family History: History reviewed. No pertinent family history.  Social History:  Social History    Socioeconomic History  . Marital status: Single    Spouse name: Not on file  . Number of children: Not on file  . Years of education: Not on file  . Highest education level: Not on file  Occupational History  . Occupation: unemployed  Tobacco Use  . Smoking status: Current Some Day Smoker    Types: Cigarettes  . Smokeless tobacco: Never Used  Vaping Use  . Vaping Use: Never used  Substance and Sexual Activity  . Alcohol use: No  . Drug use: Yes    Types: Marijuana    Comment: rare  . Sexual activity: Yes    Birth control/protection: Condom  Other Topics Concern  . Not on file  Social History Narrative   UTA certain topics - tangential thought process, cannot answer clearly. Pt not sure about school; reportedly is homeless.    Social Determinants of Health   Financial Resource Strain:   . Difficulty of Paying Living Expenses:   Food Insecurity:   . Worried About Programme researcher, broadcasting/film/video in the Last  Year:   . Ran Out of Food in the Last Year:   Transportation Needs:   . Freight forwarder (Medical):   Marland Kitchen Lack of Transportation (Non-Medical):   Physical Activity:   . Days of Exercise per Week:   . Minutes of Exercise per Session:   Stress:   . Feeling of Stress :   Social Connections:   . Frequency of Communication with Friends and Family:   . Frequency of Social Gatherings with Friends and Family:   . Attends Religious Services:   . Active Member of Clubs or Organizations:   . Attends Banker Meetings:   Marland Kitchen Marital Status:   Intimate Partner Violence:   . Fear of Current or Ex-Partner:   . Emotionally Abused:   Marland Kitchen Physically Abused:   . Sexually Abused:     SDOH:  SDOH Screenings   Alcohol Screen: Low Risk   . Last Alcohol Screening Score (AUDIT): 0  Depression (PHQ2-9): Medium Risk  . PHQ-2 Score: 6  Financial Resource Strain:   . Difficulty of Paying Living Expenses:   Food Insecurity:   . Worried About Programme researcher, broadcasting/film/video in the Last  Year:   . [employer] of Food in the Last Year:   Housing:   . Last Housing Risk Score:   Physical Activity:   . Days of Exercise per Week:   . Minutes of Exercise per Session:   Social Connections:   . Frequency of Communication with Friends and Family:   . Frequency of Social Gatherings with Friends and Family:   . Attends Religious Services:   . Active Member of Clubs or Organizations:   . Attends Banker Meetings:   Marland Kitchen Marital Status:   Stress:   . Feeling of Stress :   Tobacco Use: High Risk  . Smoking Tobacco Use: Current Some Day Smoker  . Smokeless Tobacco Use: Never Used  Transportation Needs:   . Freight forwarder (Medical):   Marland Kitchen Lack of Transportation (Non-Medical):     Last Labs:  Admission on 05/24/2020  Component Date Value Ref Range Status  . SARS Coronavirus 2 05/24/2020 NEGATIVE  NEGATIVE Final   Comment: (NOTE) SARS-CoV-2 target nucleic acids are NOT DETECTED.  The SARS-CoV-2 RNA is generally detectable in upper and lower respiratory specimens during the acute phase of infection. The lowest concentration of SARS-CoV-2 viral copies this assay can detect is 250 copies / mL. A negative result does not preclude SARS-CoV-2 infection and should not be used as the sole basis for treatment or other patient management decisions.  A negative result may occur with improper specimen collection / handling, submission of specimen other than nasopharyngeal swab, presence of viral mutation(s) within the areas targeted by this assay, and inadequate number of viral copies (<250 copies / mL). A negative result must be combined with clinical observations, patient history, and epidemiological information.  Fact Sheet for Patients:   BoilerBrush.com.cy  Fact Sheet for Healthcare Providers: https://pope.com/  This test is not yet approved or                           cleared by the [place] FDA and has been  authorized for detection and/or diagnosis of SARS-CoV-2 by FDA under an Emergency Use Authorization (EUA).  This EUA will remain in effect (meaning this test can be used) for the duration of the COVID-19 declaration under Section 564(b)(1) of the Act, 21 U.S.C.  section 360bbb-3(b)(1), unless the authorization is terminated or revoked sooner.  Performed at [facility], [address], [place], [place] [address]   . WBC 05/24/2020 5.6  4.0 - 10.5 K/uL Final  . RBC 05/24/2020 4.60  4.22 - 5.81 MIL/uL Final  . Hemoglobin 05/24/2020 14.1  13.0 - 17.0 g/dL Final  . HCT 60/45/4098 41.3  39 - 52 % Final  . MCV 05/24/2020 89.8  80.0 - 100.0 fL Final  . MCH 05/24/2020 30.7  26.0 - 34.0 pg Final  . MCHC 05/24/2020 34.1  30.0 - 36.0 g/dL Final  . RDW 11/91/4782 12.5  11.5 - 15.5 % Final  . Platelets 05/24/2020 219  150 - 400 K/uL Final  . nRBC 05/24/2020 0.0  0.0 - 0.2 % Final  . Neutrophils Relative % 05/24/2020 34  % Final  . Neutro Abs 05/24/2020 1.9  1.7 - 7.7 K/uL Final  . Lymphocytes Relative 05/24/2020 55  % Final  . Lymphs Abs 05/24/2020 3.1  0.7 - 4.0 K/uL Final  . Monocytes Relative 05/24/2020 5  % Final  . Monocytes Absolute 05/24/2020 0.3  0 - 1 K/uL Final  . Eosinophils Relative 05/24/2020 5  % Final  . Eosinophils Absolute 05/24/2020 0.3  0 - 0 K/uL Final  . Basophils Relative 05/24/2020 1  % Final  . Basophils Absolute 05/24/2020 0.0  0 - 0 K/uL Final  . Immature Granulocytes 05/24/2020 0  % Final  . Abs Immature Granulocytes 05/24/2020 0.01  0.00 - 0.07 K/uL Final   Performed at [facility], [address], [place], [place] [address]  . Sodium 05/24/2020 140  135 - 145 mmol/L Final  . Potassium 05/24/2020 3.7  3.5 - 5.1 mmol/L Final  . Chloride 05/24/2020 101  98 - 111 mmol/L Final  . CO2 05/24/2020 27  22 - 32 mmol/L Final  . Glucose, Bld 05/24/2020 101* 70 - 99 mg/dL Final   Glucose reference range applies only to samples taken after fasting for at least 8  hours.  . BUN 05/24/2020 14  6 - 20 mg/dL Final  . Creatinine, Ser 05/24/2020 1.29* 0.61 - 1.24 mg/dL Final  . Calcium 30/86/5784 9.6  8.9 - 10.3 mg/dL Final  . Total Protein 05/24/2020 6.4* 6.5 - 8.1 g/dL Final  . Albumin 69/62/9528 4.1  3.5 - 5.0 g/dL Final  . AST 41/32/4401 18  15 - 41 U/L Final  . ALT 05/24/2020 12  0 - 44 U/L Final  . Alkaline Phosphatase 05/24/2020 37* 38 - 126 U/L Final  . Total Bilirubin 05/24/2020 0.6  0.3 - 1.2 mg/dL Final  . GFR calc non Af Amer 05/24/2020 >60  >60 mL/min Final  . GFR calc Af Amer 05/24/2020 >60  >60 mL/min Final  . Anion gap 05/24/2020 12  5 - 15 Final   Performed at [facility], [address] [place], [place] [address]  . SARS Coronavirus 2 Ag 05/25/2020 NEGATIVE  NEGATIVE Final   Comment: (NOTE) SARS-CoV-2 antigen NOT DETECTED.   Negative results are presumptive.  Negative results do not preclude SARS-CoV-2 infection and should not be used as the sole basis for treatment or other patient management decisions, including infection  control decisions, particularly in the presence of clinical signs and  symptoms consistent with COVID-19, or in those who have been in contact with the virus.  Negative results must be combined with clinical observations, patient history, and epidemiological information. The expected result is Negative.  Fact Sheet for Patients: https://sanders-williams.net/  Fact Sheet for Healthcare Providers: https://martinez.com/   This test is not yet approved or cleared by the [place] FDA and  has been authorized for detection and/or diagnosis of SARS-CoV-2 by FDA under an Emergency Use Authorization (EUA).  This EUA will remain in effect (meaning this test can be used) for the duration of  the C                          OVID-19 declaration under Section 564(b)(1) of the Act, 21 U.S.C. section 360bbb-3(b)(1), unless the authorization is terminated or revoked  sooner.    Admission on 05/08/2020, Discharged on 05/08/2020  Component Date Value Ref Range Status  . WBC 05/08/2020 5.3  4.0 - 10.5 K/uL Final  . RBC 05/08/2020 4.78  4.22 - 5.81 MIL/uL Final  . Hemoglobin 05/08/2020 14.8  13.0 - 17.0 g/dL Final  . HCT 21/19/4174 43.5  39 - 52 % Final  . MCV 05/08/2020 91.0  80.0 - 100.0 fL Final  . MCH 05/08/2020 31.0  26.0 - 34.0 pg Final  . MCHC 05/08/2020 34.0  30.0 - 36.0 g/dL Final  . RDW 05/22/4817 12.5  11.5 - 15.5 % Final  . Platelets 05/08/2020 214  150 - 400 K/uL Final  . nRBC 05/08/2020 0.0  0.0 - 0.2 % Final  . Neutrophils Relative % 05/08/2020 27  % Final  . Neutro Abs 05/08/2020 1.5* 1.7 - 7.7 K/uL Final  . Lymphocytes Relative 05/08/2020 60  % Final  . Lymphs Abs 05/08/2020 3.2  0.7 - 4.0 K/uL Final  . Monocytes Relative 05/08/2020 6  % Final  . Monocytes Absolute 05/08/2020 0.3  0 - 1 K/uL Final  . Eosinophils Relative 05/08/2020 6  % Final  . Eosinophils Absolute 05/08/2020 0.3  0 - 0 K/uL Final  . Basophils Relative 05/08/2020 1  % Final  . Basophils Absolute 05/08/2020 0.0  0 - 0 K/uL Final  . Immature Granulocytes 05/08/2020 0  % Final  . Abs Immature Granulocytes 05/08/2020 0.01  0.00 - 0.07 K/uL Final   Performed at [facility], [address] [place], [place] [address]  . Sodium 05/08/2020 139  135 - 145 mmol/L Final  . Potassium 05/08/2020 3.5  3.5 - 5.1 mmol/L Final  . Chloride 05/08/2020 103  98 - 111 mmol/L Final  . CO2 05/08/2020 26  22 - 32 mmol/L Final  . Glucose, Bld 05/08/2020 97  70 - 99 mg/dL Final   Glucose reference range applies only to samples taken after fasting for at least 8 hours.  . BUN 05/08/2020 13  6 - 20 mg/dL Final  . Creatinine, Ser 05/08/2020 1.33* 0.61 - 1.24 mg/dL Final  . Calcium 97/11/6376 9.3  8.9 - 10.3 mg/dL Final  . Total Protein 05/08/2020 6.6  6.5 - 8.1 g/dL Final  . Albumin 58/85/0277 4.2  3.5 - 5.0 g/dL Final  . AST 41/28/7867 22  15 - 41 U/L Final  . ALT 05/08/2020 14  0  - 44 U/L Final  . Alkaline Phosphatase 05/08/2020 34* 38 - 126 U/L Final  . Total Bilirubin 05/08/2020 1.0  0.3 - 1.2 mg/dL Final  . GFR calc non Af Amer 05/08/2020 >60  >60 mL/min Final  . GFR calc Af Amer 05/08/2020 >60  >60 mL/min Final  . Anion gap 05/08/2020 10  5 - 15 Final   Performed at [facility], [address] [place], [place] [address]  . POC Amphetamine UR 05/08/2020 None Detected  None Detected Preliminary  . POC Secobarbital (BAR) 05/08/2020 None Detected  None Detected Preliminary  . POC Buprenorphine (BUP) 05/08/2020 None Detected  None Detected Preliminary  . POC Oxazepam (BZO) 05/08/2020 None Detected  None Detected Preliminary  . POC Cocaine UR 05/08/2020 None Detected  None Detected Preliminary  . POC Methamphetamine UR 05/08/2020 None Detected  None Detected Preliminary  . POC Morphine 05/08/2020 None Detected  None Detected Preliminary  . POC Oxycodone UR 05/08/2020 None Detected  None Detected Preliminary  . POC Methadone UR 05/08/2020 None Detected  None Detected Preliminary  . POC Marijuana UR 05/08/2020 Positive* None Detected Preliminary  . SARS Coronavirus 2 Ag 05/08/2020 NEGATIVE  NEGATIVE Final   Comment: (NOTE) SARS-CoV-2 antigen NOT DETECTED.   Negative results are presumptive.  Negative results do not preclude SARS-CoV-2 infection and should not be used as the sole basis for treatment or other patient management decisions, including infection  control decisions, particularly in the presence of clinical signs and  symptoms consistent with COVID-19, or in those who have been in contact with the virus.  Negative results must be combined with clinical observations, patient history, and epidemiological information. The expected result is Negative.  Fact Sheet for Patients: https://sanders-williams.net/  Fact Sheet for Healthcare Providers: https://martinez.com/   This test is not yet approved or cleared by the  [place] FDA and  has been authorized for detection and/or diagnosis of SARS-CoV-2 by FDA under an Emergency Use Authorization (EUA).  This EUA will remain in effect (meaning this test can be used) for the duration of  the C                          OVID-19 declaration under Section 564(b)(1) of the Act, 21 U.S.C. section 360bbb-3(b)(1), unless the authorization is terminated or revoked sooner.    Admission on 02/19/2020, Discharged on 02/22/2020  Component Date Value Ref Range Status  . SARS Coronavirus 2 02/20/2020 NEGATIVE  NEGATIVE Final   Comment: (NOTE) SARS-CoV-2 target nucleic acids are NOT DETECTED. The SARS-CoV-2 RNA is generally detectable in upper and lower respiratory specimens during the acute phase of infection. The lowest concentration of SARS-CoV-2 viral copies this assay can detect is 250 copies / mL. A negative result does not preclude SARS-CoV-2 infection and should not be used as the sole basis for treatment or other patient management decisions.  A negative result may occur with improper specimen collection / handling, submission of specimen other than nasopharyngeal swab, presence of viral mutation(s) within the areas targeted by this assay, and inadequate number of viral copies (<250 copies / mL). A negative result must be combined with clinical observations, patient history, and epidemiological information. Fact Sheet for Patients:   BoilerBrush.com.cy Fact Sheet for Healthcare Providers: https://pope.com/ This test is not yet approved or cleared                           by the [place] FDA and has been authorized for detection and/or diagnosis of SARS-CoV-2 by FDA under an Emergency Use Authorization (EUA).  This EUA will remain in effect (meaning this test can be used) for the duration of the COVID-19 declaration under Section 564(b)(1) of the Act, 21 U.S.C. section 360bbb-3(b)(1), unless the  authorization is terminated or revoked sooner. Performed at [facility], [address] [place], [place] [address]   . Acetaminophen (Tylenol), Serum 02/19/2020 <10* 10 - 30 ug/mL Final   Comment: (NOTE) Therapeutic concentrations vary significantly. A range of 10-30 ug/mL  may be an effective concentration for many patients. However, some  are best treated at concentrations outside of this range. Acetaminophen concentrations >150 ug/mL at 4 hours after ingestion  and >50 ug/mL at 12 hours after ingestion are often associated with  toxic reactions. Performed at [facility], [address], [place], [place] [address]   . Sodium 02/19/2020 139  135 - 145 mmol/L Final  . Potassium 02/19/2020 4.1  3.5 - 5.1 mmol/L Final  . Chloride 02/19/2020 103  98 - 111 mmol/L Final  . CO2 02/19/2020 28  22 - 32 mmol/L Final  . Glucose, Bld 02/19/2020 84  70 - 99 mg/dL Final   Glucose reference range applies only to samples taken after fasting for at least 8 hours.  . BUN 02/19/2020 15  6 - 20 mg/dL Final  . Creatinine, Ser 02/19/2020 1.04  0.61 - 1.24 mg/dL Final  . Calcium 62/95/2841 9.2  8.9 - 10.3 mg/dL Final  . Total Protein 02/19/2020 6.4* 6.5 - 8.1 g/dL Final  . Albumin 32/44/0102 4.0  3.5 - 5.0 g/dL Final  . AST 72/53/6644 18  15 - 41 U/L Final  . ALT 02/19/2020 13  0 - 44 U/L Final  . Alkaline Phosphatase 02/19/2020 36* 38 - 126 U/L Final  . Total Bilirubin 02/19/2020 0.8  0.3 - 1.2 mg/dL Final  . GFR calc non Af Amer 02/19/2020 >60  >60 mL/min Final  . GFR calc Af Amer 02/19/2020 >60  >60 mL/min Final  . Anion gap 02/19/2020 8  5 - 15 Final   Performed at [facility], [address] [place], [place] [address]  . Alcohol, Ethyl (B) 02/19/2020 <10  <10 mg/dL Final   Comment: (NOTE) Lowest detectable limit for serum alcohol is 10 mg/dL. For medical purposes only. Performed at [facility], [address] [place], [place] [address]   . WBC 02/19/2020 5.5  4.0 - 10.5 K/uL Final  . RBC 02/19/2020 4.45  4.22 - 5.81 MIL/uL Final  . Hemoglobin 02/19/2020 13.9  13.0 - 17.0 g/dL Final  . HCT 38/75/6433 40.5  39 - 52 % Final  . MCV 02/19/2020 91.0  80.0 - 100.0 fL Final  . MCH 02/19/2020 31.2  26.0 - 34.0 pg Final  . MCHC 02/19/2020 34.3  30.0 - 36.0 g/dL Final  . RDW 29/51/8841 13.0  11.5 - 15.5 % Final  . Platelets 02/19/2020 204  150 - 400 K/uL Final  . nRBC 02/19/2020 0.0  0.0 - 0.2 % Final  . Neutrophils Relative % 02/19/2020 29  % Final  . Neutro Abs 02/19/2020 1.6* 1.7 - 7.7 K/uL Final  . Lymphocytes Relative 02/19/2020 54  % Final  . Lymphs Abs 02/19/2020 3.0  0.7 - 4.0 K/uL Final  . Monocytes Relative 02/19/2020 7  % Final  . Monocytes Absolute 02/19/2020 0.4  0 - 1 K/uL Final  . Eosinophils Relative 02/19/2020 9  % Final  . Eosinophils Absolute 02/19/2020 0.5  0 - 0 K/uL Final  . Basophils Relative 02/19/2020 1  % Final  . Basophils Absolute 02/19/2020 0.0  0 - 0 K/uL Final  . Immature Granulocytes 02/19/2020 0  % Final  . Abs Immature Granulocytes 02/19/2020 0.01  0.00 - 0.07 K/uL Final   Performed at [facility], [address] [place], [place] [address]  . Color, Urine 02/20/2020 YELLOW  YELLOW Final  . APPearance 02/20/2020 CLEAR  CLEAR Final  . Specific Gravity, Urine 02/20/2020 1.023  1.005 - 1.030 Final  . pH 02/20/2020 6.0  5.0 - 8.0 Final  . Glucose, UA 02/20/2020 NEGATIVE  NEGATIVE mg/dL Final  . Hgb urine dipstick 02/20/2020 NEGATIVE  NEGATIVE Final  . Bilirubin Urine 02/20/2020 NEGATIVE  NEGATIVE Final  . Ketones, ur 02/20/2020 NEGATIVE  NEGATIVE mg/dL Final  . Protein, ur 60/45/4098 NEGATIVE  NEGATIVE mg/dL Final  . Nitrite 11/91/4782 NEGATIVE  NEGATIVE Final  . Glori Luis 02/20/2020 NEGATIVE  NEGATIVE Final   Performed at [facility], [address] [place], [place] [address]  . Opiates 02/20/2020 NONE DETECTED  NONE  DETECTED Final  . Cocaine 02/20/2020 NONE DETECTED  NONE DETECTED Final  . Benzodiazepines 02/20/2020 NONE DETECTED  NONE DETECTED Final  . Amphetamines 02/20/2020 NONE DETECTED  NONE DETECTED Final  . Tetrahydrocannabinol 02/20/2020 POSITIVE* NONE DETECTED Final  . Barbiturates 02/20/2020 NONE DETECTED  NONE DETECTED Final   Comment: (NOTE) DRUG SCREEN FOR MEDICAL PURPOSES ONLY.  IF CONFIRMATION IS NEEDED FOR ANY PURPOSE, NOTIFY LAB WITHIN 5 DAYS. LOWEST DETECTABLE LIMITS FOR URINE DRUG SCREEN Drug Class                     Cutoff (ng/mL) Amphetamine and metabolites    1000 Barbiturate and metabolites    200 Benzodiazepine                 200 Tricyclics and metabolites     300 Opiates and metabolites        300 Cocaine and metabolites        300 THC                            50 Performed at [facility], [address] [place], [place] [address]   Admission on 01/05/2020, Discharged on 01/08/2020  Component Date Value Ref Range Status  . SARS Coronavirus 2 by RT PCR 01/05/2020 NEGATIVE  NEGATIVE Final   Comment: (NOTE) SARS-CoV-2 target nucleic acids are NOT DETECTED. The SARS-CoV-2 RNA is generally detectable in upper respiratoy specimens during the acute phase of infection. The lowest concentration of SARS-CoV-2 viral copies this assay can detect is 131 copies/mL. A negative result does not preclude SARS-Cov-2 infection and should not be used as the sole basis for treatment or other patient management decisions. A negative result may occur with  improper specimen collection/handling, submission of specimen other than nasopharyngeal swab, presence of viral mutation(s) within the areas targeted by this assay, and inadequate number of viral copies (<131 copies/mL). A negative result must be combined with clinical observations, patient history, and epidemiological information. The expected result is Negative. Fact Sheet for Patients:   https://www.moore.com/ Fact Sheet for Healthcare Providers:  https://www.young.biz/ This test is not yet ap                          proved or cleared by the [place] FDA and  has been authorized for detection and/or diagnosis of SARS-CoV-2 by FDA under an Emergency Use Authorization (EUA). This EUA will remain  in effect (meaning this test can be used) for the duration of the COVID-19 declaration under Section 564(b)(1) of the Act, 21 U.S.C. section 360bbb-3(b)(1), unless the authorization is  terminated or revoked sooner.   . Influenza A by PCR 01/05/2020 NEGATIVE  NEGATIVE Final  . Influenza B by PCR 01/05/2020 NEGATIVE  NEGATIVE Final   Comment: (NOTE) The Xpert Xpress SARS-CoV-2/FLU/RSV assay is intended as an aid in  the diagnosis of influenza from Nasopharyngeal swab specimens and  should not be used as a sole basis for treatment. Nasal washings and  aspirates are unacceptable for Xpert Xpress SARS-CoV-2/FLU/RSV  testing. Fact Sheet for Patients: https://www.moore.com/ Fact Sheet for Healthcare Providers: https://www.young.biz/ This test is not yet approved or cleared by the [place] FDA and  has been authorized for detection and/or diagnosis of SARS-CoV-2 by  FDA under an Emergency Use Authorization (EUA). This EUA will remain  in effect (meaning this test can be used) for the duration of the  Covid-19 declaration under Section 564(b)(1) of the Act, 21  U.S.C. section 360bbb-3(b)(1), unless the authorization is  terminated or revoked. Performed at [facility], [address] [place], [place] [address]     Allergies: Shellfish allergy  PTA Medications: (Not in a hospital admission)   Medical Decision Making  Creatinine slightly elevated at 1.29, creatinine was 1.33 on 05/08/2020. CMP otherwise within normal limits. CBC within normal limits UDS positive for  marijuana  Resume Invega 6 mg nightly for schizophrenia Resume Cogentin 1 mg twice daily for EPS prevention Trazodone 50 mg nightly as needed for sleep Hydroxyzine 25 mg 3 times daily as needed for anxiety    Recommendations  Based on my evaluation the patient does not appear to have an emergency medical condition.   Patient will be placed in the continuous assessment area at [facility] for treatment and stabilization. He will be reevaluated on 05/25/2020. The treatment team will determine disposition at that time.      Jackelyn Poling, NP 05/25/20  4:33 AM

## 2020-05-24 NOTE — ED Notes (Addendum)
Patients phone charger socks and pants in locker 19

## 2020-05-25 ENCOUNTER — Other Ambulatory Visit: Payer: Self-pay

## 2020-05-25 ENCOUNTER — Encounter (HOSPITAL_COMMUNITY): Payer: Self-pay | Admitting: Emergency Medicine

## 2020-05-25 LAB — CBC WITH DIFFERENTIAL/PLATELET
Abs Immature Granulocytes: 0.01 10*3/uL (ref 0.00–0.07)
Basophils Absolute: 0 10*3/uL (ref 0.0–0.1)
Basophils Relative: 1 %
Eosinophils Absolute: 0.3 10*3/uL (ref 0.0–0.5)
Eosinophils Relative: 5 %
HCT: 41.3 % (ref 39.0–52.0)
Hemoglobin: 14.1 g/dL (ref 13.0–17.0)
Immature Granulocytes: 0 %
Lymphocytes Relative: 55 %
Lymphs Abs: 3.1 10*3/uL (ref 0.7–4.0)
MCH: 30.7 pg (ref 26.0–34.0)
MCHC: 34.1 g/dL (ref 30.0–36.0)
MCV: 89.8 fL (ref 80.0–100.0)
Monocytes Absolute: 0.3 10*3/uL (ref 0.1–1.0)
Monocytes Relative: 5 %
Neutro Abs: 1.9 10*3/uL (ref 1.7–7.7)
Neutrophils Relative %: 34 %
Platelets: 219 10*3/uL (ref 150–400)
RBC: 4.6 MIL/uL (ref 4.22–5.81)
RDW: 12.5 % (ref 11.5–15.5)
WBC: 5.6 10*3/uL (ref 4.0–10.5)
nRBC: 0 % (ref 0.0–0.2)

## 2020-05-25 LAB — COMPREHENSIVE METABOLIC PANEL
ALT: 12 U/L (ref 0–44)
AST: 18 U/L (ref 15–41)
Albumin: 4.1 g/dL (ref 3.5–5.0)
Alkaline Phosphatase: 37 U/L — ABNORMAL LOW (ref 38–126)
Anion gap: 12 (ref 5–15)
BUN: 14 mg/dL (ref 6–20)
CO2: 27 mmol/L (ref 22–32)
Calcium: 9.6 mg/dL (ref 8.9–10.3)
Chloride: 101 mmol/L (ref 98–111)
Creatinine, Ser: 1.29 mg/dL — ABNORMAL HIGH (ref 0.61–1.24)
GFR calc Af Amer: >60 mL/min (ref >60)
GFR calc non Af Amer: >60 mL/min (ref >60)
Glucose, Bld: 101 mg/dL — ABNORMAL HIGH (ref 70–99)
Potassium: 3.7 mmol/L (ref 3.5–5.1)
Sodium: 140 mmol/L (ref 135–145)
Total Bilirubin: 0.6 mg/dL (ref 0.3–1.2)
Total Protein: 6.4 g/dL — ABNORMAL LOW (ref 6.5–8.1)

## 2020-05-25 LAB — POC SARS CORONAVIRUS 2 AG: SARS Coronavirus 2 Ag: NEGATIVE

## 2020-05-25 LAB — SARS CORONAVIRUS 2 BY RT PCR (HOSPITAL ORDER, PERFORMED IN ~~LOC~~ HOSPITAL LAB): SARS Coronavirus 2: NEGATIVE

## 2020-05-25 MED ORDER — BENZTROPINE MESYLATE 1 MG PO TABS
1.0000 mg | ORAL_TABLET | Freq: Two times a day (BID) | ORAL | Status: DC
  Administered 2020-05-25: 1 mg via ORAL
  Filled 2020-05-25: qty 1

## 2020-05-25 MED ORDER — PALIPERIDONE ER 6 MG PO TB24: 6.0000 mg | ORAL_TABLET | Freq: Every day | ORAL | Status: DC

## 2020-05-25 NOTE — Discharge Instructions (Addendum)
Take medications as prescribed °Keep scheduled appointments with [facility] °

## 2020-05-25 NOTE — Progress Notes (Signed)
Per Reola Calkins, NP, the patient is psychiatrically cleared for discharge. The patient is recommended to continue to follow up with his [name] team for outpatient psychiatric services.    CSW attempted to contact the patient's legal guardian, [name] ([phone]) to inform him of the patient's disposition. There was no answer.   CSW attempted to contact the patient's mother, [name] [phone]). The number listed is not currently operating. Unable to leave voicemail.     CSW spoke with [facility] Team Lead, [name] [phone]) of [name]. [name] reports she will pick the patient up at discharge. [name] states the patient is currently active with their team and his outpatient services will continue.      CSW will continue to follow.     Baldo Daub, MSW, LCSW Clinical Social Worker [facility]

## 2020-05-25 NOTE — ED Notes (Signed)
Patient denies SI/HI. Patient interacts minimal with staff and peers still sleepy. Monitoring continues and patient remains safe.

## 2020-05-25 NOTE — ED Notes (Signed)
Pt resting with eyes closed. Safety maintained. 

## 2020-05-25 NOTE — ED Notes (Signed)
Pt sleep in no acute distress. Safety maintained.

## 2020-05-25 NOTE — ED Notes (Signed)
Pt sleeping at present, no distress noted, calm at present, monitoring for safety.

## 2020-05-25 NOTE — ED Notes (Signed)
Pt discharged to ACTT rep who stated they have had pt for over a year and never knew him to have a legal guardian. Representative stated pt have a payee named Jeral Fruit but no legal guardian. NP notified. Safety maintained.

## 2020-05-25 NOTE — ED Notes (Signed)
Pt received sandwich and chips, drinking H2O

## 2020-05-25 NOTE — ED Notes (Signed)
Pt asleep but easily aroused. Pt received am medication without difficulty. Pt states in a laughing tone, "I've been running a lot out there. I needed some rest. I slept good last night". Denies SI/HI at present. Denies A/VH. Informed pt to notify staff with any needs or concerns. Safety maintained.

## 2020-05-25 NOTE — ED Notes (Signed)
Pt presents responding to internal stimuli, stating he doesn't feel right.  Denies SI, HI or AVH.  Skin search completed, monitoring for safety.  No distress noted, calm & cooperative at present.

## 2020-05-25 NOTE — ED Provider Notes (Addendum)
FBC/OBS ASAP Discharge Summary  Date and Time: 05/25/2020 1:17 PM  Name: [name]  MRN:  [id number]   Discharge Diagnoses:  Final diagnoses:  Schizophrenia, unspecified type [facility])    Subjective: Patient reports today that he is doing better.  He states that he just felt paranoid yesterday and asked why he called EMS.  He does report that he is current with [facility] but cannot tell me the last time he saw them.  He states they are supposed to come to his house and that he is supposed to be taking his medications but he has not been compliant with them recently.  He denies any suicidal homicidal ideations and denies any hallucinations.  Patient states that he is ready for discharge and he is informed that we will have to contact his ACT team to try to set up a good safety plan.  Stay Summary: Patient is a 29 year old male with a history of schizophrenia who presented to the [facility] voluntarily with law emforcement.  Patient reported last night that he was having paranoia and was requesting EMS to take him to the hospital.  Patient was admitted to the continuous observation unit and was restarted on medications.  Patient slept well last night and reports this morning that he has not been taking his medications as prescribed and that he needs to see his ACT team.  Social work contacted [employer] team and they are going to pick the patient up from here so they can have him for his appointment and adjust medications if needed.  Patient has denied any suicidal homicidal ideations and denies any hallucinations.  Patient is responding to questions better and giving full answers and does not appear to be responding to any internal or external stimuli at this time.  At this time patient does not meet inpatient psychiatric treatment criteria.  Attempted to contact patient's listed legal guardian multiple times and also called other listed relatives in his chart.  Was unable to contact anyone in the list.   Patient is discharged with his ACT team which based off of previous notes that is who patient has been discharged with in the past.  Total Time spent with patient: 30 minutes  Past Psychiatric History: Schizophrenia, cannabis use disorder, Current with [facility],Numerous ED visits and hospitalizations. Last known admit was 02/22/20 at [facility]  Past Medical History:  Past Medical History:  Diagnosis Date  . Depression   . Schizophrenia [facility])     Past Surgical History:  Procedure Laterality Date  . BACK SURGERY     Family History: History reviewed. No pertinent family history. Family Psychiatric History: None reported Social History:  Social History   Substance and Sexual Activity  Alcohol Use No     Social History   Substance and Sexual Activity  Drug Use Yes  . Types: Marijuana   Comment: rare    Social History   Socioeconomic History  . Marital status: Single    Spouse name: Not on file  . Number of children: Not on file  . Years of education: Not on file  . Highest education level: Not on file  Occupational History  . Occupation: unemployed  Tobacco Use  . Smoking status: Current Some Day Smoker    Types: Cigarettes  . Smokeless tobacco: Never Used  Vaping Use  . Vaping Use: Never used  Substance and Sexual Activity  . Alcohol use: No  . Drug use: Yes    Types: Marijuana  Comment: rare  . Sexual activity: Yes    Birth control/protection: Condom  Other Topics Concern  . Not on file  Social History Narrative   UTA certain topics - tangential thought process, cannot answer clearly. Pt not sure about school; reportedly is homeless.    Social Determinants of Health   Financial Resource Strain:   . Difficulty of Paying Living Expenses:   Food Insecurity:   . Worried About Programme researcher, broadcasting/film/video in the Last Year:   . Barista in the Last Year:   Transportation Needs:   . Freight forwarder (Medical):   Marland Kitchen Lack of Transportation (Non-Medical):    Physical Activity:   . Days of Exercise per Week:   . Minutes of Exercise per Session:   Stress:   . Feeling of Stress :   Social Connections:   . Frequency of Communication with Friends and Family:   . Frequency of Social Gatherings with Friends and Family:   . Attends Religious Services:   . Active Member of Clubs or Organizations:   . Attends Banker Meetings:   Marland Kitchen Marital Status:    SDOH:  SDOH Screenings   Alcohol Screen: Low Risk   . Last Alcohol Screening Score (AUDIT): 0  Depression (PHQ2-9): Medium Risk  . PHQ-2 Score: 6  Financial Resource Strain:   . Difficulty of Paying Living Expenses:   Food Insecurity:   . Worried About Programme researcher, broadcasting/film/video in the Last Year:   . [employer] of Food in the Last Year:   Housing:   . Last Housing Risk Score:   Physical Activity:   . Days of Exercise per Week:   . Minutes of Exercise per Session:   Social Connections:   . Frequency of Communication with Friends and Family:   . Frequency of Social Gatherings with Friends and Family:   . Attends Religious Services:   . Active Member of Clubs or Organizations:   . Attends Banker Meetings:   Marland Kitchen Marital Status:   Stress:   . Feeling of Stress :   Tobacco Use: High Risk  . Smoking Tobacco Use: Current Some Day Smoker  . Smokeless Tobacco Use: Never Used  Transportation Needs:   . Freight forwarder (Medical):   Marland Kitchen Lack of Transportation (Non-Medical):     Has this patient used any form of tobacco in the last 30 days? (Cigarettes, Smokeless Tobacco, Cigars, and/or Pipes) A prescription for an FDA-approved tobacco cessation medication was offered at discharge and the patient refused  Current Medications:  Current Facility-Administered Medications  Medication Dose Route Frequency Provider Last Rate Last Admin  . acetaminophen (TYLENOL) tablet 650 mg  650 mg Oral Q6H PRN [name] A, NP      . alum & mag hydroxide-simeth (MAALOX/MYLANTA) 200-200-20  MG/5ML suspension 30 mL  30 mL Oral Q4H PRN [name] A, NP      . benztropine (COGENTIN) tablet 1 mg  1 mg Oral BID [name] A, NP   1 mg at 05/25/20 7035  . hydrOXYzine (ATARAX/VISTARIL) tablet 25 mg  25 mg Oral TID PRN [name] A, NP      . magnesium hydroxide (MILK OF MAGNESIA) suspension 30 mL  30 mL Oral Daily PRN [name] A, NP      . paliperidone (INVEGA) 24 hr tablet 6 mg  6 mg Oral QHS [name] A, NP      . traZODone (DESYREL) tablet 50  mg  50 mg Oral QHS PRN Jackelyn Poling, NP       Current Outpatient Medications  Medication Sig Dispense Refill  . benztropine (COGENTIN) 1 MG tablet Take 1 tablet (1 mg total) by mouth 2 (two) times daily. (Patient not taking: Reported on 05/25/2020) 60 tablet 0  . paliperidone (INVEGA) 6 MG 24 hr tablet Take 1 tablet (6 mg total) by mouth at bedtime. (Patient not taking: Reported on 05/25/2020) 30 tablet 0  . traZODone (DESYREL) 100 MG tablet Take 1 tablet (100 mg total) by mouth at bedtime as needed for sleep. (Patient not taking: Reported on 05/25/2020) 30 tablet 0    PTA Medications: (Not in a hospital admission)   Musculoskeletal  Strength & Muscle Tone: within normal limits Gait & Station: normal Patient leans: N/A  Psychiatric Specialty Exam  Presentation  General Appearance: Appropriate for Environment;Casual  Eye Contact:Fair  Speech:Clear and Coherent;Normal Rate  Speech Volume:Normal  Handedness:Right   Mood and Affect  Mood:Anxious  Affect:Congruent;Appropriate   Thought Process  Thought Processes:Coherent  Descriptions of Associations:Intact  Orientation:Full (Time, Place and Person)  Thought Content:WDL  Hallucinations:Hallucinations: None  Ideas of Reference:None  Suicidal Thoughts:Suicidal Thoughts: No  Homicidal Thoughts:Homicidal Thoughts: No   Sensorium  Memory:Immediate Fair;Recent Fair;Remote Fair  Judgment:Fair  Insight:Fair   Executive Functions   Concentration:Fair  Attention Span:Fair  Recall:Fair  Fund of Knowledge:Fair  Language:Good   Psychomotor Activity  Psychomotor Activity:Psychomotor Activity: Normal   Assets  Assets:Communication Skills;Desire for Improvement;Financial Resources/Insurance;Housing;Social Support;Transportation   Sleep  Sleep:Sleep: Good   Physical Exam  Physical Exam Vitals and nursing note reviewed.  Constitutional:      Appearance: He is well-developed.  Cardiovascular:     Rate and Rhythm: Normal rate.  Pulmonary:     Effort: Pulmonary effort is normal.  Musculoskeletal:        General: Normal range of motion.  Skin:    General: Skin is warm.  Neurological:     Mental Status: He is alert and oriented to person, place, and time.    Review of Systems  Constitutional: Negative.   HENT: Negative.   Eyes: Negative.   Respiratory: Negative.   Cardiovascular: Negative.   Gastrointestinal: Negative.   Genitourinary: Negative.   Musculoskeletal: Negative.   Skin: Negative.   Neurological: Negative.   Endo/Heme/Allergies: Negative.   Psychiatric/Behavioral: Negative.    Blood pressure 124/81, pulse 64, temperature 97.7 F (36.5 C), temperature source Temporal, resp. rate 18, height 5' 8.11" (1.73 m), weight 147 lb (66.7 kg), SpO2 100 %. Body mass index is 22.28 kg/m.  Demographic Factors:  NA  Loss Factors: NA  Historical Factors: Impulsivity  Risk Reduction Factors:   Living with another person, especially a relative, Positive social support, Positive therapeutic relationship and Positive coping skills or problem solving skills  Continued Clinical Symptoms:  Previous Psychiatric Diagnoses and Treatments  Cognitive Features That Contribute To Risk:  None    Suicide Risk:  Mild:  Suicidal ideation of limited frequency, intensity, duration, and specificity.  There are no identifiable plans, no associated intent, mild dysphoria and related symptoms, good  self-control (both objective and subjective assessment), few other risk factors, and identifiable protective factors, including available and accessible social support.  Plan Of Care/Follow-up recommendations:  Continue activity as tolerated. Continue diet as recommended by your PCP. Ensure to keep all appointments with outpatient providers.  Disposition: Discharge home with ACT services  Gerlene Burdock Verna Hamon, FNP 05/25/2020, 1:17 PM

## 2020-05-25 NOTE — ED Triage Notes (Signed)
Pt responding to internal stimuli, states he doesn't feel right.

## 2020-06-11 ENCOUNTER — Encounter (HOSPITAL_COMMUNITY): Payer: Self-pay | Admitting: *Deleted

## 2020-06-11 ENCOUNTER — Other Ambulatory Visit: Payer: Self-pay

## 2020-06-11 ENCOUNTER — Emergency Department (HOSPITAL_COMMUNITY)
Admission: EM | Admit: 2020-06-11 | Discharge: 2020-06-11 | Disposition: A | Discharge status: Home / Self Care | Payer: Medicaid Other | Attending: Emergency Medicine | Admitting: Emergency Medicine

## 2020-06-11 DIAGNOSIS — F99 Mental disorder, not otherwise specified: Secondary | ICD-10-CM | POA: Insufficient documentation

## 2020-06-11 DIAGNOSIS — Z5321 Procedure and treatment not carried out due to patient leaving prior to being seen by health care provider: Secondary | ICD-10-CM | POA: Diagnosis not present

## 2020-06-11 LAB — ETHANOL: Alcohol, Ethyl (B): <10 mg/dL (ref <10)

## 2020-06-11 LAB — CBC
HCT: 43.1 % (ref 39.0–52.0)
Hemoglobin: 14.8 g/dL (ref 13.0–17.0)
MCH: 30.8 pg (ref 26.0–34.0)
MCHC: 34.3 g/dL (ref 30.0–36.0)
MCV: 89.8 fL (ref 80.0–100.0)
Platelets: 226 10*3/uL (ref 150–400)
RBC: 4.8 MIL/uL (ref 4.22–5.81)
RDW: 12.9 % (ref 11.5–15.5)
WBC: 6.3 10*3/uL (ref 4.0–10.5)
nRBC: 0.3 % — ABNORMAL HIGH (ref 0.0–0.2)

## 2020-06-11 LAB — COMPREHENSIVE METABOLIC PANEL
ALT: 12 U/L (ref 0–44)
AST: 18 U/L (ref 15–41)
Albumin: 4.6 g/dL (ref 3.5–5.0)
Alkaline Phosphatase: 35 U/L — ABNORMAL LOW (ref 38–126)
Anion gap: 9 (ref 5–15)
BUN: 16 mg/dL (ref 6–20)
CO2: 29 mmol/L (ref 22–32)
Calcium: 9.3 mg/dL (ref 8.9–10.3)
Chloride: 102 mmol/L (ref 98–111)
Creatinine, Ser: 1.02 mg/dL (ref 0.61–1.24)
GFR calc Af Amer: >60 mL/min (ref >60)
GFR calc non Af Amer: >60 mL/min (ref >60)
Glucose, Bld: 88 mg/dL (ref 70–99)
Potassium: 3.6 mmol/L (ref 3.5–5.1)
Sodium: 140 mmol/L (ref 135–145)
Total Bilirubin: 1.3 mg/dL — ABNORMAL HIGH (ref 0.3–1.2)
Total Protein: 6.8 g/dL (ref 6.5–8.1)

## 2020-06-11 NOTE — ED Notes (Signed)
Pt not in waiting room. Called x3.

## 2020-06-11 NOTE — ED Triage Notes (Signed)
Pt arrives via [facility]. Pt says that he is "a mental health patient and needs some help" " I am broke, don't have anywhere to stay, I need some of those burgundy scrubs" Pt says that he does fear for his life, but denies SI or HI. Reports that he has used marijuana and xanax today. Ambulatory with steady gait.

## 2020-06-17 ENCOUNTER — Other Ambulatory Visit: Payer: Self-pay

## 2020-06-17 ENCOUNTER — Encounter (HOSPITAL_COMMUNITY): Payer: Self-pay | Admitting: Emergency Medicine

## 2020-06-17 ENCOUNTER — Emergency Department (HOSPITAL_COMMUNITY)
Admission: EM | Admit: 2020-06-17 | Discharge: 2020-06-17 | Disposition: A | Discharge status: Home / Self Care | Payer: Medicaid Other | Attending: Emergency Medicine | Admitting: Emergency Medicine

## 2020-06-17 DIAGNOSIS — F200 Paranoid schizophrenia: Secondary | ICD-10-CM | POA: Insufficient documentation

## 2020-06-17 DIAGNOSIS — Z79899 Other long term (current) drug therapy: Secondary | ICD-10-CM | POA: Diagnosis not present

## 2020-06-17 DIAGNOSIS — F17210 Nicotine dependence, cigarettes, uncomplicated: Secondary | ICD-10-CM | POA: Diagnosis not present

## 2020-06-17 DIAGNOSIS — R440 Auditory hallucinations: Secondary | ICD-10-CM | POA: Diagnosis present

## 2020-06-17 DIAGNOSIS — Z590 Homelessness: Secondary | ICD-10-CM | POA: Insufficient documentation

## 2020-06-17 LAB — CBC
HCT: 41.5 % (ref 39.0–52.0)
Hemoglobin: 13.7 g/dL (ref 13.0–17.0)
MCH: 29.9 pg (ref 26.0–34.0)
MCHC: 33 g/dL (ref 30.0–36.0)
MCV: 90.6 fL (ref 80.0–100.0)
Platelets: 217 10*3/uL (ref 150–400)
RBC: 4.58 MIL/uL (ref 4.22–5.81)
RDW: 13.1 % (ref 11.5–15.5)
WBC: 5.6 10*3/uL (ref 4.0–10.5)
nRBC: 0 % (ref 0.0–0.2)

## 2020-06-17 LAB — COMPREHENSIVE METABOLIC PANEL
ALT: 13 U/L (ref 0–44)
AST: 21 U/L (ref 15–41)
Albumin: 4.1 g/dL (ref 3.5–5.0)
Alkaline Phosphatase: 31 U/L — ABNORMAL LOW (ref 38–126)
Anion gap: 12 (ref 5–15)
BUN: 10 mg/dL (ref 6–20)
CO2: 23 mmol/L (ref 22–32)
Calcium: 9.3 mg/dL (ref 8.9–10.3)
Chloride: 104 mmol/L (ref 98–111)
Creatinine, Ser: 1.22 mg/dL (ref 0.61–1.24)
GFR calc Af Amer: >60 mL/min (ref >60)
GFR calc non Af Amer: >60 mL/min (ref >60)
Glucose, Bld: 109 mg/dL — ABNORMAL HIGH (ref 70–99)
Potassium: 3.5 mmol/L (ref 3.5–5.1)
Sodium: 139 mmol/L (ref 135–145)
Total Bilirubin: 0.9 mg/dL (ref 0.3–1.2)
Total Protein: 6 g/dL — ABNORMAL LOW (ref 6.5–8.1)

## 2020-06-17 LAB — ACETAMINOPHEN LEVEL: Acetaminophen (Tylenol), Serum: <10 ug/mL — ABNORMAL LOW (ref 10–30)

## 2020-06-17 LAB — ETHANOL: Alcohol, Ethyl (B): <10 mg/dL (ref <10)

## 2020-06-17 LAB — SALICYLATE LEVEL: Salicylate Lvl: <7 mg/dL — ABNORMAL LOW (ref 7.0–30.0)

## 2020-06-17 NOTE — ED Notes (Signed)
ACT team in lobby to pick up patient

## 2020-06-17 NOTE — Progress Notes (Signed)
CSW spoke with Wendelyn Breslow [phone]) team lead with [name]. She states that pt was on the schedule to be seen today. Pt's ACTT case worker will come to [facility] ED to pick pt up at 12pm. Lanora Manis at [facility] ED notified.   Wells Guiles, MSW, LCSW, LCAS Clinical Social Worker II Disposition CSW [phone]

## 2020-06-17 NOTE — Discharge Instructions (Signed)
Follow-up with your routine psychiatric provider.

## 2020-06-17 NOTE — ED Provider Notes (Addendum)
[name] EMERGENCY DEPARTMENT Provider Note   CSN: [id number] Arrival date & time: 06/17/20  0408     History Chief Complaint  Patient presents with  . Medical Clearance    [name] is a 29 y.o. male.  HPI     This is a 29 year old male with history of schizophrenia who presents with "I need some medicine to get my brain right."  He will not further elaborate.  He tells me he has never been on medication for his brain.  He denies history of schizophrenia but clearly has a documented history of schizophrenia in the chart.  He reports hearing voices but cannot tell me what he is hearing.  He denies SI, HI, alcohol or drug use.  He states that he needs something to "help me focus."  He is homeless.  Past Medical History:  Diagnosis Date  . Depression   . Schizophrenia [facility])     Patient Active Problem List   Diagnosis Date Noted  . Schizophrenia, paranoid type (HCC) 02/14/2018  . Cannabis use disorder, moderate, dependence (HCC) 05/10/2016    Past Surgical History:  Procedure Laterality Date  . BACK SURGERY         No family history on file.  Social History   Tobacco Use  . Smoking status: Current Some Day Smoker    Types: Cigarettes  . Smokeless tobacco: Never Used  Vaping Use  . Vaping Use: Never used  Substance Use Topics  . Alcohol use: No  . Drug use: Yes    Types: Marijuana    Comment: rare    Home Medications Prior to Admission medications   Medication Sig Start Date End Date Taking? Authorizing Provider  benztropine (COGENTIN) 1 MG tablet Take 1 tablet (1 mg total) by mouth 2 (two) times daily. Patient not taking: Reported on 05/25/2020 02/24/20   Maryagnes Amos, FNP  paliperidone (INVEGA) 6 MG 24 hr tablet Take 1 tablet (6 mg total) by mouth at bedtime. Patient not taking: Reported on 05/25/2020 02/24/20   Maryagnes Amos, FNP  traZODone (DESYREL) 100 MG tablet Take 1 tablet (100 mg total) by mouth at bedtime as  needed for sleep. Patient not taking: Reported on 05/25/2020 02/24/20   Maryagnes Amos, FNP    Allergies    Shellfish allergy  Review of Systems   Review of Systems  Constitutional: Negative for fever.  Respiratory: Negative for shortness of breath.   Cardiovascular: Negative for chest pain.  Gastrointestinal: Negative for abdominal pain, nausea and vomiting.  Genitourinary: Negative for dysuria.  Psychiatric/Behavioral: Positive for hallucinations. Negative for suicidal ideas.  All other systems reviewed and are negative.   Physical Exam Updated Vital Signs BP 124/65 (BP Location: Right Arm)   Pulse 60   Temp 98.1 F (36.7 C) (Oral)   Resp 16   SpO2 99%   Physical Exam Vitals and nursing note reviewed.  Constitutional:      Appearance: He is well-developed. He is not ill-appearing.  HENT:     Head: Normocephalic and atraumatic.     Nose: Nose normal.     Mouth/Throat:     Mouth: Mucous membranes are moist.  Cardiovascular:     Rate and Rhythm: Normal rate and regular rhythm.     Heart sounds: Normal heart sounds. No murmur heard.   Pulmonary:     Effort: Pulmonary effort is normal. No respiratory distress.     Breath sounds: Normal breath sounds. No wheezing.  Abdominal:  Palpations: Abdomen is soft.     Tenderness: There is no abdominal tenderness.  Musculoskeletal:     Cervical back: Neck supple.     Right lower leg: No edema.     Left lower leg: No edema.  Skin:    General: Skin is warm and dry.  Neurological:     Mental Status: He is alert and oriented to person, place, and time.  Psychiatric:     Comments: Bizarre affect     ED Results / Procedures / Treatments   Labs (all labs ordered are listed, but only abnormal results are displayed) Labs Reviewed  COMPREHENSIVE METABOLIC PANEL - Abnormal; Notable for the following components:      Result Value   Glucose, Bld 109 (*)    Total Protein 6.0 (*)    Alkaline Phosphatase 31 (*)    All  other components within normal limits  SALICYLATE LEVEL - Abnormal; Notable for the following components:   Salicylate Lvl <7.0 (*)    All other components within normal limits  ACETAMINOPHEN LEVEL - Abnormal; Notable for the following components:   Acetaminophen (Tylenol), Serum <10 (*)    All other components within normal limits  ETHANOL  CBC  RAPID URINE DRUG SCREEN, HOSP PERFORMED    EKG None  Radiology No results found.  Procedures Procedures (including critical care time)  Medications Ordered in ED Medications - No data to display  ED Course  I have reviewed the triage vital signs and the nursing notes.  Pertinent labs & imaging results that were available during my care of the patient were reviewed by me and considered in my medical decision making (see chart for details).    MDM Rules/Calculators/A&P                           Patient presents with marked bizarre affect requesting medications to "focus."  He appears to be responding to internal stimuli does endorse auditory hallucinations although will not disclose what he hears.  He is otherwise nontoxic without physical complaints.  Lab work reviewed and he is medically cleared for TTS evaluation.  6:49 AM  Spoke with Berna Spare.  We will have social work contact act team for plan of care.  He is otherwise psychiatrically cleared.  Final Clinical Impression(s) / ED Diagnoses Final diagnoses:  Paranoid schizophrenia [facility])    Rx / DC Orders ED Discharge Orders    None       Momin Misko, Mayer Masker, MD 06/17/20 5621    Shon Baton, MD 06/17/20 [phone]

## 2020-06-17 NOTE — ED Notes (Signed)
ACT team going to pick up pt at noon

## 2020-06-17 NOTE — ED Provider Notes (Signed)
12:10 PM patient cleared by psychiatry.  Notified by RN that patient's act team is here to pick him up.  He has a safe disposition at this time.  Reviewed labs.  Patient ready for discharge.  BP 126/73 (BP Location: Left Arm)   Pulse 67   Temp 98.7 F (37.1 C) (Oral)   Resp 18   SpO2 99%     Renne Crigler, PA-C 06/17/20 1210    Terrilee Files, MD 06/17/20 [phone]

## 2020-06-17 NOTE — ED Notes (Signed)
TTS to be done

## 2020-06-17 NOTE — ED Triage Notes (Signed)
Patient arrives via [facility].  Patient stated to EMS that he "needs to figure out life".  He states that he wants a medicine for shortness of breath, which he does not have at this time.  Patient denies any SI/HI.  No drugs or ETOH per patient today.

## 2020-06-17 NOTE — ED Notes (Signed)
RN walked pt to lobby- ride no longer there. Looked outside. Pt left. RN attempted to call ACT to notify but was not able to get through.

## 2020-06-17 NOTE — BH Assessment (Signed)
Tele Assessment Note   Patient Name: [name] MRN: [id number] Referring Physician: Dr. Ross  Location of Patient: [name] Location of Provider: Behavioral Health TTS Department  [name] is an 29 y.o. male.  -Clinician reviewed note by Dr. Wilkie Aye.  This is a 29 year old male with history of schizophrenia who presents with "I need some medicine to get my brain right."  He will not further elaborate.  He tells me he has never been on medication for his brain.  He denies history of schizophrenia but clearly has a documented history of schizophrenia in the chart.  He reports hearing voices but cannot tell me what he is hearing.  He denies SI, HI, alcohol or drug use.  He states that he needs something to "help me focus."  He is homeless.  Pt is not a fountain of information.  He answers questions with "I don't know, I don't understand" or simply Yes, sir, no sir.  Patient says that he needs to be on medication "to make my brain right."  Patient then says he has no problem with concentration, memory or making decisions.  Patient denies any SI, intention or plan.  He denies any past suicide attempts.  Patient also denies any HI.  He also denies any A/V hallucinations.  Pt has a past hx of hallucinations but he denies this now.  Patient has a flat affect, good eye contact and is oriented x3.  Pt appears confused but is not actively responding to internal stimuli.  Pt thought process is coherent.  He says he wants to get back on medication and get help with housing.  Pt reports normal appetite and says he only gets about 4 hours sleep.    Patient was at [facility] on 05-24-20 and 05-08-20.  He was inpatient at [facility] in 02/2020 and 12/2019.  He says he has no outpatient services.  He actually has [name] [phone].  Pt says he has a guardian named [name].  -Clinician discussed pt care with [name], FNP who said patient was psych cleared.  CSW to contact [facility]  to coordinate patient being picked up.  Clinician informed Dr. Wilkie Aye of disposition.  She was fine with patient's ACTT person being contacted.   Diagnosis: F25.0 Schizophrenia  Past Medical History:  Past Medical History:  Diagnosis Date  . Depression   . Schizophrenia [facility])     Past Surgical History:  Procedure Laterality Date  . BACK SURGERY      Family History: No family history on file.  Social History:  reports that he has been smoking cigarettes. He has never used smokeless tobacco. He reports current drug use. Drug: Marijuana. He reports that he does not drink alcohol.  Additional Social History:  Alcohol / Drug Use Pain Medications: NOne Prescriptions: Pt does not know what his medications are and wants to get back on them. Over the Counter: None History of alcohol / drug use?: No history of alcohol / drug abuse  CIWA: CIWA-Ar BP: 124/65 Pulse Rate: 60 COWS:    Allergies:  Allergies  Allergen Reactions  . Shellfish Allergy Hives and Itching    seafood    Home Medications: (Not in a hospital admission)   OB/GYN Status:  No LMP for male patient.  General Assessment Data Location of Assessment: [facility] ED TTS Assessment: In system Is this a Tele or Face-to-Face Assessment?: Tele Assessment Is this an Initial Assessment or a Re-assessment for this encounter?: Initial Assessment Patient Accompanied  by:: N/A Language Other than English: No Living Arrangements: Homeless/Shelter What gender do you identify as?: Male Date Telepsych consult ordered in [facility]: 06/17/20 Time Telepsych consult ordered in [facility]: 0510 Marital status: Single Pregnancy Status: No Living Arrangements: Other (Comment) (Pt is homeless.) Can pt return to current living arrangement?: Yes Admission Status: Voluntary Is patient capable of signing voluntary admission?: Yes Referral Source: Self/Family/Friend (Pt contacted EMS to come to the hospital.) Insurance type: [employer] [place] Access      Crisis Care Plan Living Arrangements: Other (Comment) (Pt is homeless.) Name of Psychiatrist: None Name of Therapist: None  Education Status Is patient currently in school?: No Is the patient employed, unemployed or receiving disability?: Receiving disability income  Risk to self with the past 6 months Suicidal Ideation: No Has patient been a risk to self within the past 6 months prior to admission? : No Suicidal Intent: No Has patient had any suicidal intent within the past 6 months prior to admission? : No Is patient at risk for suicide?: No Suicidal Plan?: No Has patient had any suicidal plan within the past 6 months prior to admission? : No Access to Means: No What has been your use of drugs/alcohol within the last 12 months?: Denies Previous Attempts/Gestures: No How many times?: 0 Other Self Harm Risks: None Triggers for Past Attempts: None known Intentional Self Injurious Behavior: None Family Suicide History: No Recent stressful life event(s): Other (Comment) (Not being on medication and being homeless) Persecutory voices/beliefs?: No Depression: No Depression Symptoms:  (Denies depressive symptoms) Substance abuse history and/or treatment for substance abuse?: No Suicide prevention information given to non-admitted patients: Not applicable  Risk to Others within the past 6 months Homicidal Ideation: No Does patient have any lifetime risk of violence toward others beyond the six months prior to admission? : No Thoughts of Harm to Others: No Current Homicidal Intent: No Current Homicidal Plan: No Access to Homicidal Means: No Identified Victim: No one History of harm to others?: No Assessment of Violence: None Noted Violent Behavior Description: Denies Does patient have access to weapons?: No Criminal Charges Pending?: No Does patient have a court date: No Is patient on probation?: No  Psychosis Hallucinations: None noted (Past hx of auditory  hallucinations, denies now.) Delusions: None noted  Mental Status Report Appearance/Hygiene: Body odor, In scrubs Eye Contact: Good Motor Activity: Unremarkable Speech: Logical/coherent Level of Consciousness: Alert Mood: Helpless Affect: Appropriate to circumstance Anxiety Level: Minimal Thought Processes: Circumstantial Judgement: Impaired Orientation: Person, Place, Situation Obsessive Compulsive Thoughts/Behaviors: None  Cognitive Functioning Concentration: Decreased Memory: Remote Intact, Recent Intact Is patient IDD: No Insight: Poor Impulse Control: Fair Appetite: Good Have you had any weight changes? : No Change Sleep: Decreased Total Hours of Sleep:  (<4H/D) Vegetative Symptoms: Decreased grooming  ADLScreening [facility] Assessment Services) Patient's cognitive ability adequate to safely complete daily activities?: Yes Patient able to express need for assistance with ADLs?: Yes Independently performs ADLs?: Yes (appropriate for developmental age)  Prior Inpatient Therapy Prior Inpatient Therapy: Yes Prior Therapy Dates: 02-22-20 & 01-05-20 Prior Therapy Facilty/Provider(s): [facility] Reason for Treatment: schizophrenia  Prior Outpatient Therapy Prior Outpatient Therapy: No Does patient have an ACCT team?: No Does patient have Intensive In-House Services?  : No Does patient have [facility] services? : No Does patient have P4CC services?: No  ADL Screening (condition at time of admission) Patient's cognitive ability adequate to safely complete daily activities?: Yes Is the patient deaf or have difficulty hearing?: No Does the patient have difficulty seeing,  even when wearing glasses/contacts?: No Does the patient have difficulty concentrating, remembering, or making decisions?: Yes Patient able to express need for assistance with ADLs?: Yes Does the patient have difficulty dressing or bathing?: No Independently performs ADLs?: Yes (appropriate for developmental  age) Does the patient have difficulty walking or climbing stairs?: No Weakness of Legs: None Weakness of Arms/Hands: None  Home Assistive Devices/Equipment Home Assistive Devices/Equipment: None    Abuse/Neglect Assessment (Assessment to be complete while patient is alone) Abuse/Neglect Assessment Can Be Completed: Yes Physical Abuse: Denies Verbal Abuse: Denies Sexual Abuse: Denies Exploitation of patient/patient's resources: Denies Self-Neglect: Denies     Merchant navy officer (For Healthcare) Does Patient Have a Medical Advance Directive?: No Would patient like information on creating a medical advance directive?: No - Patient declined          Disposition:  Disposition Initial Assessment Completed for this Encounter: Yes  This service was provided via telemedicine using a 2-way, interactive audio and video technology.  Names of all persons participating in this telemedicine service and their role in this encounter. Name: [name] Role: patient  Name: Beatriz Stallion, M.S. LCAS QP Role: clinician  Name:  Role:   Name:  Role:     Alexandria Lodge 06/17/2020 6:38 AM

## 2020-06-27 ENCOUNTER — Ambulatory Visit (HOSPITAL_COMMUNITY)
Admission: EM | Admit: 2020-06-27 | Discharge: 2020-06-28 | Disposition: A | Discharge status: Home / Self Care | Payer: Medicaid Other | Attending: Behavioral Health | Admitting: Behavioral Health

## 2020-06-27 ENCOUNTER — Other Ambulatory Visit: Payer: Self-pay

## 2020-06-27 ENCOUNTER — Emergency Department (HOSPITAL_COMMUNITY)
Admission: EM | Admit: 2020-06-27 | Discharge: 2020-06-27 | Disposition: A | Discharge status: Other Acute Inpatient Hospital | Payer: Medicaid Other | Attending: Emergency Medicine | Admitting: Emergency Medicine

## 2020-06-27 ENCOUNTER — Encounter (HOSPITAL_COMMUNITY): Payer: Self-pay | Admitting: Emergency Medicine

## 2020-06-27 DIAGNOSIS — F1220 Cannabis dependence, uncomplicated: Secondary | ICD-10-CM | POA: Insufficient documentation

## 2020-06-27 DIAGNOSIS — Z20822 Contact with and (suspected) exposure to covid-19: Secondary | ICD-10-CM | POA: Diagnosis not present

## 2020-06-27 DIAGNOSIS — Z008 Encounter for other general examination: Secondary | ICD-10-CM

## 2020-06-27 DIAGNOSIS — F1210 Cannabis abuse, uncomplicated: Secondary | ICD-10-CM | POA: Insufficient documentation

## 2020-06-27 DIAGNOSIS — F200 Paranoid schizophrenia: Secondary | ICD-10-CM | POA: Insufficient documentation

## 2020-06-27 DIAGNOSIS — F17210 Nicotine dependence, cigarettes, uncomplicated: Secondary | ICD-10-CM | POA: Insufficient documentation

## 2020-06-27 DIAGNOSIS — Z046 Encounter for general psychiatric examination, requested by authority: Secondary | ICD-10-CM | POA: Insufficient documentation

## 2020-06-27 DIAGNOSIS — R440 Auditory hallucinations: Secondary | ICD-10-CM | POA: Insufficient documentation

## 2020-06-27 DIAGNOSIS — R454 Irritability and anger: Secondary | ICD-10-CM | POA: Diagnosis not present

## 2020-06-27 DIAGNOSIS — Y909 Presence of alcohol in blood, level not specified: Secondary | ICD-10-CM | POA: Insufficient documentation

## 2020-06-27 DIAGNOSIS — F2089 Other schizophrenia: Secondary | ICD-10-CM | POA: Diagnosis present

## 2020-06-27 DIAGNOSIS — Z01818 Encounter for other preprocedural examination: Secondary | ICD-10-CM | POA: Diagnosis present

## 2020-06-27 LAB — COMPREHENSIVE METABOLIC PANEL
ALT: 12 U/L (ref 0–44)
AST: 17 U/L (ref 15–41)
Albumin: 4.2 g/dL (ref 3.5–5.0)
Alkaline Phosphatase: 32 U/L — ABNORMAL LOW (ref 38–126)
Anion gap: 8 (ref 5–15)
BUN: 14 mg/dL (ref 6–20)
CO2: 28 mmol/L (ref 22–32)
Calcium: 8.9 mg/dL (ref 8.9–10.3)
Chloride: 102 mmol/L (ref 98–111)
Creatinine, Ser: 1.3 mg/dL — ABNORMAL HIGH (ref 0.61–1.24)
GFR calc Af Amer: >60 mL/min (ref >60)
GFR calc non Af Amer: >60 mL/min (ref >60)
Glucose, Bld: 92 mg/dL (ref 70–99)
Potassium: 4 mmol/L (ref 3.5–5.1)
Sodium: 138 mmol/L (ref 135–145)
Total Bilirubin: 0.6 mg/dL (ref 0.3–1.2)
Total Protein: 6.7 g/dL (ref 6.5–8.1)

## 2020-06-27 LAB — CBC
HCT: 39.4 % (ref 39.0–52.0)
Hemoglobin: 13.7 g/dL (ref 13.0–17.0)
MCH: 31.3 pg (ref 26.0–34.0)
MCHC: 34.8 g/dL (ref 30.0–36.0)
MCV: 90 fL (ref 80.0–100.0)
Platelets: 204 10*3/uL (ref 150–400)
RBC: 4.38 MIL/uL (ref 4.22–5.81)
RDW: 12.9 % (ref 11.5–15.5)
WBC: 5.6 10*3/uL (ref 4.0–10.5)
nRBC: 0 % (ref 0.0–0.2)

## 2020-06-27 LAB — SARS CORONAVIRUS 2 BY RT PCR (HOSPITAL ORDER, PERFORMED IN ~~LOC~~ HOSPITAL LAB): SARS Coronavirus 2: NEGATIVE

## 2020-06-27 LAB — ACETAMINOPHEN LEVEL: Acetaminophen (Tylenol), Serum: <10 ug/mL — ABNORMAL LOW (ref 10–30)

## 2020-06-27 LAB — ETHANOL: Alcohol, Ethyl (B): <10 mg/dL (ref <10)

## 2020-06-27 LAB — SALICYLATE LEVEL: Salicylate Lvl: <7 mg/dL — ABNORMAL LOW (ref 7.0–30.0)

## 2020-06-27 MED ORDER — ACETAMINOPHEN 325 MG PO TABS: 650.0000 mg | ORAL_TABLET | Freq: Four times a day (QID) | ORAL | Status: DC | PRN

## 2020-06-27 MED ORDER — ALUM & MAG HYDROXIDE-SIMETH 200-200-20 MG/5ML PO SUSP: 30.0000 mL | ORAL | Status: DC | PRN

## 2020-06-27 MED ORDER — TRAZODONE HCL 100 MG PO TABS
100.0000 mg | ORAL_TABLET | Freq: Once | ORAL | Status: AC
  Administered 2020-06-27: 100 mg via ORAL
  Filled 2020-06-27: qty 1

## 2020-06-27 MED ORDER — MAGNESIUM HYDROXIDE 400 MG/5ML PO SUSP: 30.0000 mL | Freq: Every day | ORAL | Status: DC | PRN

## 2020-06-27 NOTE — ED Notes (Signed)
Transfer from [facility], pt with history of Schizophrenia, presents with auditory hallucinations.  Denies SI or HI.  Pt responding to internal stimuli, laughing to self at times  Calm & cooperative at present, no distress noted.  Skin search completed, monitoring for safety.  Comfort measures given.

## 2020-06-27 NOTE — ED Notes (Signed)
Patient belongings placed in [place] [address]7(shoes, clothes)

## 2020-06-27 NOTE — BH Assessment (Addendum)
Comprehensive Clinical Assessment (CCA) Note  06/27/2020 [name] [id number]   [name] is a 29 year old male who presents voluntary and unaccompanied to [facility]. Pt was a poor historian during the assessment and when asked questions pt, reported, that makes him mad. Pt reported, he doesn't want to be around other people. Clinician asked the pt, "what brought you to the hospital?" Pt reported, "I can't breathe I needs medications to focus on reality." Pt denies, SI, HI, AVH, self-injurious behaviors and access to weapons.   Pt smoking marijuana everyday. Pt denies, being linked to OPT resources (medication management and/or counseling.)    Pt presents quiet, awake with blocked speech at times. Pt's eye contact was poor. Pt's mood, affect was anxious. Pt's was oriented x2. Pt's judgement was impaired. Pt's insight was poor. During the assessment pt appeared easily irritated when asked questions. Pt reported, if discharged from [facility] he can contract for safety.   Disposition: Gillermo Murdoch, NP recommends pt to be admitted to Continuing Observation Unit. Discussed with Willia Craze, RN.   Diagnosis: Schizophrenia.  *Pt declined for clinician to contact family, friends support to gather additional information.*   Visit Diagnosis:   No diagnosis found.   CCA Screening, Triage and Referral (STR)  Patient Reported Information How did you hear about [place]? Self (UTA)  Referral name: Pt contacted law enforcement requesting help.  Referral phone number: No data recorded  Whom do you see for routine medical problems? I don't have a doctor  Practice/Facility Name: No data recorded Practice/Facility Phone Number: No data recorded Name of Contact: No data recorded Contact Number: No data recorded Contact Fax Number: No data recorded Prescriber Name: No data recorded Prescriber Address (if known): No data recorded  What Is the Reason for Your Visit/Call Today? Paranoid  schizophrenia.  How Long Has This Been Causing You Problems? 1 wk - 1 month  What Do You Feel Would Help You the Most Today? Medication   Have You Recently Been in Any Inpatient Treatment (Hospital/Detox/Crisis Center/28-Day Program)? Yes  Name/Location of Program/Hospital:[facility]  How Long Were You There? Per chart, "2 days."  When Were You Discharged? 02/24/20 (Per chart.)   Have You Ever Received Services From [facility] Before? Yes  Who Do You See at [facility]? [facility] and [facility] Cont[facility].   Have You Recently Had Any Thoughts About Hurting Yourself? No  Are You Planning to Commit Suicide/Harm Yourself At This time? No   Have you Recently Had Thoughts About Hurting Someone [name]? No  Explanation: No data recorded  Have You Used Any Alcohol or Drugs in the Past 24 Hours? Yes  How Long Ago Did You Use Drugs or Alcohol? 1200  What Did You Use and How Much? Pt reported, smoking marijuana everyday.   Do You Currently Have a Therapist/Psychiatrist? No  Name of Therapist/Psychiatrist: Per chart, pt has an ACT Team however pt currently denies.   Have You Been Recently Discharged From Any Office Practice or Programs? No  Explanation of Discharge From Practice/Program: No data recorded    CCA Screening Triage Referral Assessment Type of Contact: Face-to-Face  Is this Initial or Reassessment? No data recorded Date Telepsych consult ordered in [facility]:  06/17/20  Time Telepsych consult ordered in [facility]:  0510   Patient Reported Information Reviewed? Yes  Patient Left Without Being Seen? No data recorded Reason for Not Completing Assessment: TTS cart currently in use, 2 pt's ahead of this consult. TTS to assess pt when cart  is available    Collateral Involvement: Pt declined for clinician to contact collaterals.   Does Patient Have a Automotive engineer Guardian? No data recorded Name and Contact of Legal Guardian: [name]  If Minor  and Not Living with Parent(s), Who has Custody? No data recorded Is CPS involved or ever been involved? In the Past  Is APS involved or ever been involved? No data recorded  Patient Determined To Be At Risk for Harm To Self or Others Based on Review of Patient Reported Information or Presenting Complaint? No  Method: No data recorded Availability of Means: No data recorded Intent: No data recorded Notification Required: No data recorded Additional Information for Danger to Others Potential: No data recorded Additional Comments for Danger to Others Potential: No data recorded Are There Guns or Other Weapons in Your Home? No data recorded Types of Guns/Weapons: No data recorded Are These Weapons Safely Secured?                            No data recorded Who Could Verify You Are Able To Have These Secured: No data recorded Do You Have any Outstanding Charges, Pending Court Dates, Parole/Probation? No data recorded Contacted To Inform of Risk of Harm To Self or Others: Unable to Contact:   Location of Assessment: [facility] Services   Does Patient Present under Involuntary Commitment? No  IVC Papers Initial File Date: No data recorded  [place] of Residence: [place]   Patient Currently Receiving the Following Services: Not Receiving Services   Determination of Need: Emergent (2 hours)   Options For Referral: Other: Comment ([facility] Continuing Obsevation Unit.)   CCA Biopsychosocial  Intake/Chief Complaint:  CCA Intake With Chief Complaint CCA Part Two Date: 06/27/20 CCA Part Two Time: 2249 Chief Complaint/Presenting Problem: Pt reported, "I can't breathe I needs medicaitions to focus on reality." Patient's Currently Reported Symptoms/Problems: Pt reported, "I can't breathe I needs medicaitions to focus on reality." Individual's Strengths: UTA Individual's Preferences: UTA Type of Services Patient Feels Are Needed: Pt reported, needing medication. Initial Clinical  Notes/Concerns: Pt is a poor historian  Mental Health Symptoms Depression:  Depression: Difficulty Concentrating  Mania:  Mania: None  Anxiety:   Anxiety: Difficulty concentrating  Psychosis:  Psychosis:  (Pt denies.)  Trauma:  Trauma: None  Obsessions:  Obsessions: None  Compulsions:  Compulsions: None  Inattention:  Inattention: None  Hyperactivity/Impulsivity:  Hyperactivity/Impulsivity: N/A  Oppositional/Defiant Behaviors:  Oppositional/Defiant Behaviors: N/A  Emotional Irregularity:  Emotional Irregularity: N/A  Other Mood/Personality Symptoms:      Mental Status Exam Appearance and self-care  Stature:  Stature: Average  Weight:  Weight: Average weight  Clothing:  Clothing: Casual  Grooming:  Grooming: Normal  Cosmetic use:  Cosmetic Use: None  Posture/gait:  Posture/Gait: Normal  Motor activity:  Motor Activity: Not Remarkable  Sensorium  Attention:  Attention: Distractible  Concentration:  Concentration: Variable  Orientation:  Orientation: Person, Place  Recall/memory:  Recall/Memory: Defective in Remote, Defective in Recent  Affect and Mood  Affect:  Affect: Anxious  Mood:  Mood: Anxious  Relating  Eye contact:  Eye Contact:  (Poor.)  Facial expression:  Facial Expression: Anxious  Attitude toward examiner:  Attitude Toward Examiner: Guarded  Thought and Language  Speech flow: Speech Flow: Blocked  Thought content:  Thought Content:  (UTA)  Preoccupation:  Preoccupations: None  Hallucinations:  Hallucinations: Other (Comment) (Pt denies.)  Organization:     Art therapist  Fund of Knowledge:  Fund of Knowledge: Poor  Intelligence:  Intelligence: Average  Abstraction:  Abstraction:  Industrial/product designer)  Judgement:  Judgement: Impaired  Reality Testing:  Reality Testing:  (UTA)  Insight:  Insight: Poor  Decision Making:  Decision Making: Confused  Social Functioning  Social Maturity:  Social Maturity:  Industrial/product designer)  Social Judgement:  Social Judgement:  (UTA)  Stress   Stressors:  Stressors: Other (Comment) (UTA)  Coping Ability:  Coping Ability:  (UTA)  Skill Deficits:  Skill Deficits: Decision making  Supports:  Supports: Support needed     Religion: Religion/Spirituality Are You A Religious Person?:  (UTA)  Leisure/Recreation: Leisure / Recreation Do You Have Hobbies?:  (UTA)  Exercise/Diet: Exercise/Diet Do You Exercise?:  (UTA) Do You Follow a Special Diet?: No Do You Have Any Trouble Sleeping?:  (UTA)   CCA Employment/Education  Employment/Work Situation: Employment / Work Situation Employment situation:  Industrial/product designer) Patient's job has been impacted by current illness:  (UTA) Describe how patient's job has been impacted: UTA What is the longest time patient has a held a job?: UTA 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 Has patient ever been in the [employer]?: No (Per chart.)  Education: Education Is Patient Currently Attending School?: No (Per chart.) Last Grade Completed:  (Per chart, 9th or 10th grade.) Name of High School: UTA Did Garment/textile technologist From [employer]?: No (Per chart.) Did You Attend College?: No (Per chart.) Did You Attend Graduate School?: No (Per chart.) Did You Have Any Special Interests In School?: UTA Did You Have An Individualized Education Program (IIEP):  (UTA) Did You Have Any Difficulty At School?:  (UTA) Patient's Education Has Been Impacted by Current Illness:  (UTA)   CCA Family/Childhood History  Family and Relationship History: Family history Marital status:  (UTA) Are you sexually active?:  (UTA) What is your sexual orientation?: UTA Has your sexual activity been affected by drugs, alcohol, medication, or emotional stress?: UTA Does patient have children?:  (UTA)  Childhood History:  Childhood History By whom was/is the patient raised?:  (UTA) Additional childhood history information: UTA Description of patient's relationship with caregiver when they were a child: UTA Patient's  description of current relationship with people who raised him/her: UTA How were you disciplined when you got in trouble as a child/adolescent?: UTA Does patient have siblings?:  (UTA) Did patient suffer any verbal/emotional/physical/sexual abuse as a child?:  (UTA) Did patient suffer from severe childhood neglect?:  (UTA) Has patient ever been sexually abused/assaulted/raped as an adolescent or adult?:  (UTA.) Was the patient ever a victim of a crime or a disaster?:  (UTA) Witnessed domestic violence?:  (UTA) Has patient been affected by domestic violence as an adult?:  Industrial/product designer)  Child/Adolescent Assessment:    CCA Substance Use  Alcohol/Drug Use: Alcohol / Drug Use Pain Medications: See MAR Prescriptions: See MAR Over the Counter: See MAR History of alcohol / drug use?: Yes Longest period of sobriety (when/how long): Unknown Negative Consequences of Use: Personal relationships Substance #1 Name of Substance 1: Marijuana 1 - Age of First Use: UTA 1 - Amount (size/oz): UTA 1 - Frequency: Per pt, "everyday." 1 - Duration: Ongoing. 1 - Last Use / Amount: Daily.    ASAM's:  Six Dimensions of Multidimensional Assessment  Dimension 1:  Acute Intoxication and/or Withdrawal Potential:      Dimension 2:  Biomedical Conditions and Complications:      Dimension 3:  Emotional, Behavioral, or Cognitive Conditions and Complications:  Dimension 4:  Readiness to Change:     Dimension 5:  Relapse, Continued use, or Continued Problem Potential:     Dimension 6:  Recovery/Living Environment:     ASAM Severity Score:    ASAM Recommended Level of Treatment:     Substance use Disorder (SUD)    Recommendations for Services/Supports/Treatments: Recommendations for Services/Supports/Treatments Recommendations For Services/Supports/Treatments: Other (Comment) (Continuing Observation Unit.)  DSM5 Diagnoses: Patient Active Problem List   Diagnosis Date Noted  . Schizophrenia, paranoid  type (HCC) 02/14/2018  . Cannabis use disorder, moderate, dependence (HCC) 05/10/2016    Referrals to Alternative Service(s): Referred to Alternative Service(s):   Place:   Date:   Time:    Referred to Alternative Service(s):   Place:   Date:   Time:    Referred to Alternative Service(s):   Place:   Date:   Time:    Referred to Alternative Service(s):   Place:   Date:   Time:     Redmond Pullingreylese D Micaila Ziemba  Comprehensive Clinical Assessment (CCA) Screening, Triage and Referral Note  06/27/2020 Talitha GivensMarlo Bakos [id number]  Visit Diagnosis: No diagnosis found.  Patient Reported Information How did you hear about us? Self (UTA)   Referral name: Pt contacted law enforcement requesting help.   Referral phone number: No data recorded Whom do you see for routine medical problems? I don't have a doctor   Practice/Facility Name: No data recorded  Practice/Facility Phone Number: No data recorded  Name of Contact: No data recorded  Contact Number: No data recorded  Contact Fax Number: No data recorded  Prescriber Name: No data recorded  Prescriber Address (if known): No data recorded What Is the Reason for Your Visit/Call Today? Paranoid schizophrenia.  How Long Has This Been Causing You Problems? 1 wk - 1 month  Have You Recently Been in Any Inpatient Treatment (Hospital/Detox/Crisis Center/28-Day Program)? Yes   Name/Location of Program/Hospital:[facility]   How Long Were You There? Per chart, "2 days."   When Were You Discharged? 02/24/20 (Per chart.)  [facility] You [facility]? Yes   [facility] and [facility] Cont[facility] Observation Unit.  Have You Recently Had Any Thoughts About Hurting Yourself? No   Are You Planning to Commit Suicide/Harm Yourself At This time?  No  Have you Recently Had Thoughts About Hurting Someone Karolee Ohslse? No   Explanation: No data recorded Have You Used Any Alcohol or Drugs in the Past 24 Hours? Yes   How  Long Ago Did You Use Drugs or Alcohol?  1200   What Did You Use and How Much? Pt reported, smoking marijuana everyday.  What Do You Feel Would Help You the Most Today? Medication  Do You Currently Have a Therapist/Psychiatrist? No   Name of Therapist/Psychiatrist: Per chart, pt has an ACT Team however pt currently denies.   Have You Been Recently Discharged From Any Office Practice or Programs? No   Explanation of Discharge From Practice/Program:  No data recorded    CCA Screening Triage Referral Assessment Type of Contact: Face-to-Face   Is this Initial or Reassessment? No data recorded  Date Telepsych consult ordered in [facility]:  06/17/20   [facility]  0510  Patient Reported Information Reviewed? Yes   Patient Left Without Being Seen? No data recorded  Reason for Not Completing Assessment: TTS cart currently in use, 2 pt's ahead of this consult. TTS to assess pt when cart is available  Collateral Involvement: Pt declined for clinician to contact collaterals.  Does Patient Have a Automotive engineer Guardian? No data recorded  Name and Contact of Legal Guardian:  [name]  If Minor and Not Living with Parent(s), Who has Custody? No data recorded Is CPS involved or ever been involved? In the Past  Is APS involved or ever been involved? No data recorded Patient Determined To Be At Risk for Harm To Self or Others Based on Review of Patient Reported Information or Presenting Complaint? No   Method: No data recorded  Availability of Means: No data recorded  Intent: No data recorded  Notification Required: No data recorded  Additional Information for Danger to Others Potential:  No data recorded  Additional Comments for Danger to Others Potential:  No data recorded  Are There Guns or Other Weapons in Your Home?  No data recorded   Types of Guns/Weapons: No data recorded   Are These Weapons Safely Secured?                              No data  recorded   Who Could Verify You Are Able To Have These Secured:    No data recorded Do You Have any Outstanding Charges, Pending Court Dates, Parole/Probation? No data recorded Contacted To Inform of Risk of Harm To Self or Others: Unable to Contact:  Location of Assessment: [facility] Assessment Services  Does Patient Present under Involuntary Commitment? No   IVC Papers Initial File Date: No data recorded  [place] of Residence: [place]  Patient Currently Receiving the Following Services: Not Receiving Services   Determination of Need: Emergent (2 hours)   Options For Referral: Other: Comment ([facility] Continuing Obsevation Unit.)   Redmond Pulling, [facility]   Redmond Pulling, MS, [facility], [facility] Triage Specialist [phone]

## 2020-06-27 NOTE — ED Triage Notes (Signed)
Pt with Hx of Schizophrenia, presents complaint of hearing voices, denies SI or HI.

## 2020-06-27 NOTE — ED Triage Notes (Signed)
Patient arrives via EMS stating "I need to figure out my life." Patient here recently with same complaint. Patient is having auditory hallucinations, not command. Patient noted to be agitated.

## 2020-06-27 NOTE — ED Provider Notes (Addendum)
[facility]-EMERGENCY DEPT Provider Note   CSN: [id number] Arrival date & time: 06/27/20  0505     History Chief Complaint  Patient presents with  . Medical Clearance    [name] is a 29 y.o. male -with history of schizophrenia presents to the ER for several issues.  Patient is found asleep in the bed, appears very drowsy.  Takes a few minutes to remember why he is here.  He then tells me he is "scared for my life".  When asked him to elaborate he responds "I do not know" and looks away.  Patient is a poor historian.  Ask him if he has had suicidal thoughts or attempts and that is why he is scared of his life but he says that he has not.  He denies homicidal ideations.  When asked if somebody is trying to hurt him he looks away and says "I do not know".  He is asking to be sent to behavioral.  Level 5 caveat due to patient cooperation, poor historian.  HPI     Past Medical History:  Diagnosis Date  . Depression   . Schizophrenia [facility])     Patient Active Problem List   Diagnosis Date Noted  . Schizophrenia, paranoid type (HCC) 02/14/2018  . Cannabis use disorder, moderate, dependence (HCC) 05/10/2016    Past Surgical History:  Procedure Laterality Date  . BACK SURGERY         No family history on file.  Social History   Tobacco Use  . Smoking status: Current Some Day Smoker    Types: Cigarettes  . Smokeless tobacco: Never Used  Vaping Use  . Vaping Use: Never used  Substance Use Topics  . Alcohol use: No  . Drug use: Yes    Types: Marijuana    Comment: rare    Home Medications Prior to Admission medications   Medication Sig Start Date End Date Taking? Authorizing Provider  benztropine (COGENTIN) 1 MG tablet Take 1 tablet (1 mg total) by mouth 2 (two) times daily. Patient not taking: Reported on 05/25/2020 02/24/20   Maryagnes Amos, FNP  paliperidone (INVEGA) 6 MG 24 hr tablet Take 1 tablet (6 mg total) by mouth at  bedtime. Patient not taking: Reported on 05/25/2020 02/24/20   Maryagnes Amos, FNP  traZODone (DESYREL) 100 MG tablet Take 1 tablet (100 mg total) by mouth at bedtime as needed for sleep. Patient not taking: Reported on 05/25/2020 02/24/20   Maryagnes Amos, FNP    Allergies    Shellfish allergy  Review of Systems   Review of Systems  Unable to perform ROS: Other  All other systems reviewed and are negative.   Physical Exam Updated Vital Signs BP (!) 130/91 (BP Location: Right Arm)   Pulse 79   Temp 98.4 F (36.9 C) (Oral)   Ht 5\' 5"  (1.651 m)   Wt 72.6 kg   SpO2 95%   BMI 26.63 kg/m   Physical Exam Constitutional:      Appearance: He is well-developed.  HENT:     Head: Normocephalic.     Nose: Nose normal.  Eyes:     General: Lids are normal.  Cardiovascular:     Rate and Rhythm: Normal rate.  Pulmonary:     Effort: Pulmonary effort is normal. No respiratory distress.  Musculoskeletal:        General: Normal range of motion.     Cervical back: Normal range of motion.  Neurological:  Mental Status: He is alert.  Psychiatric:        Mood and Affect: Affect is inappropriate.     Comments: Bizarre affect.  Good eye contact but when answering questions he looks away.  Can give very simple answers like "I do not know" in yes/no.  Denies SI.  Denies HI.  Endorsing auditory hallucinations.     ED Results / Procedures / Treatments   Labs (all labs ordered are listed, but only abnormal results are displayed) Labs Reviewed  COMPREHENSIVE METABOLIC PANEL - Abnormal; Notable for the following components:      Result Value   Creatinine, Ser 1.30 (*)    Alkaline Phosphatase 32 (*)    All other components within normal limits  SALICYLATE LEVEL - Abnormal; Notable for the following components:   Salicylate Lvl <7.0 (*)    All other components within normal limits  ACETAMINOPHEN LEVEL - Abnormal; Notable for the following components:   Acetaminophen  (Tylenol), Serum <10 (*)    All other components within normal limits  SARS CORONAVIRUS 2 BY RT PCR (HOSPITAL ORDER, PERFORMED IN [facility])  CBC  ETHANOL  RAPID URINE DRUG SCREEN, HOSP PERFORMED    EKG None  Radiology No results found.  Procedures Procedures (including critical care time)  Medications Ordered in ED Medications - No data to display  ED Course  I have reviewed the triage vital signs and the nursing notes.  Pertinent labs & imaging results that were available during my care of the patient were reviewed by me and considered in my medical decision making (see chart for details).    MDM Rules/Calculators/A&P                          29 year old male with history of schizophrenia, homelessness presents to the ER for behavioral health evaluation.  He is specifically requesting this.    ER work-up initiated in triage including screening labs, EtOH, APAP, ASA.  These were personally reviewed and interpreted by me, no significant abnormalities.  Vital signs are acceptable.  He has no medical complaints. No indication for further medical work up here.   Discussed patient with [facility] UC APP Annice Pih and gave report, agrees with transfer to [facility] UC for evaluation.  Patient medically cleared at this time. Final Clinical Impression(s) / ED Diagnoses Final diagnoses:  Evaluation by psychiatric service required    Rx / DC Orders ED Discharge Orders    None       Liberty Handy, PA-C 06/27/20 1434    Margarita Grizzle, MD 06/29/20 1335

## 2020-06-28 MED ORDER — PALIPERIDONE ER 6 MG PO TB24
6.0000 mg | ORAL_TABLET | Freq: Every day | ORAL | Status: DC
  Administered 2020-06-28: 6 mg via ORAL
  Filled 2020-06-28: qty 1

## 2020-06-28 MED ORDER — BENZTROPINE MESYLATE 1 MG PO TABS
1.0000 mg | ORAL_TABLET | Freq: Two times a day (BID) | ORAL | Status: DC
  Administered 2020-06-28: 1 mg via ORAL
  Filled 2020-06-28: qty 1

## 2020-06-28 MED ORDER — PALIPERIDONE ER 6 MG PO TB24
6.0000 mg | ORAL_TABLET | Freq: Every day | ORAL | Status: DC
Start: 2020-06-28 — End: 2020-06-28

## 2020-06-28 NOTE — ED Notes (Signed)
Patient is resting in bed with eyes opened. Monitoring continues.

## 2020-06-28 NOTE — ED Notes (Signed)
Pt resting comfortably, no distress noted, monitoring for safety. 

## 2020-06-28 NOTE — ED Notes (Signed)
Patient alert and oriented. Patient denies SI/HI. Patient denies A/V/H. Patient voices no concerns. Patient given morning medications. Monitoring continues safety maintained.

## 2020-06-28 NOTE — ED Provider Notes (Signed)
Behavioral Health Admission H&P [facility])  Date: 06/28/20 Patient Name: [name] MRN: [id number] Chief Complaint:  Chief Complaint  Patient presents with  . Schizophrenia   Chief Complaint/Presenting Problem: Pt reported, "I can't breathe I needs medicaitions to focus on reality."  Diagnoses:Schizophrenia, paranoid type  Cannabis use disorder, moderate, dependence    HPI: [name] is a 29 y.o. male who presents voluntarily and unaccompanied to [facility]. The patient is a poor historian and irritated during the assessment. The patient voiced that he does not want to participate in the assessment process. He states, "I am tired. I do not want to answer any more questions." TTS clinician asked the patient, "what brought you to the hospital?" Pt reported, "I can't breathe. I need medications to focus on reality." The patient does not appear to be responding to internal or external stimuli. Neither is the patient presenting with any delusional thinking. The patient denies auditory or visual hallucinations. The patient denies any suicidal, homicidal, or self-harm ideations. The patient is not presenting with any psychotic or paranoid behaviors. During an encounter with the patient, he was able to answer questions appropriately. The patient smokes marijuana every day. The patient plan is to being linked to OPT resources (medication management or counseling.)   The patient will remain under observation overnight and reassess in the A.M. to determine if he meets the criteria for psychiatric inpatient admission or can be discharged.  PHQ 2-9:    ED from 05/08/2020 in [facility]  Thoughts that you would be better off dead, or of hurting yourself in some way Not at all  PHQ-9 Total Score 6        [facility]) [facility] Ad[facility] (Discharged) [facility]  ED from 11/15/2018 in [facility]  C-SSRS RISK CATEGORY Error: Question 2 not populated No Risk No Risk       Total Time spent with patient: 20 minutes  Musculoskeletal  Strength & Muscle Tone: within normal limits Gait & Station: normal Patient leans: N/A  Psychiatric Specialty Exam  Presentation General Appearance: Bizarre  Eye Contact:Poor  Speech:Clear and Coherent  Speech Volume:Normal  Handedness:Right   Mood and Affect  Mood:Anxious;Angry  Affect:Congruent   Thought Process  Thought Processes:Coherent  Descriptions of Associations:Intact  Orientation:Full (Time, Place and Person)  Thought Content:WDL  Hallucinations:Hallucinations: None  Ideas of Reference:None  Suicidal Thoughts:Suicidal Thoughts: No  Homicidal Thoughts:Homicidal Thoughts: No   Sensorium  Memory:Immediate Good;Recent Good;Remote Good  Judgment:Fair  Insight:Fair   Executive Functions  Concentration:Fair  Attention Span:Fair  Recall:Fair  Fund of Knowledge:Fair  Language:Good   Psychomotor Activity  Psychomotor Activity:Psychomotor Activity: Normal   Assets  Assets:Communication Skills;Desire for Improvement;Financial Resources/Insurance;Housing;Social Support   Sleep  Sleep:Sleep: Good   Physical Exam ROS  Blood pressure 132/88, pulse (!) 57, temperature (!) 95.8 F (35.4 C), temperature source Tympanic, resp. rate 18, height 5\' 5"  (1.651 m), weight 72.6 kg, SpO2 100 %. Body mass index is 26.63 kg/m.  Past Psychiatric History: Schizophrenia, paranoid type (HCC)   Is the patient at risk to self? No  Has the patient been a risk to self in the past 6 months? No .    Has the patient been a risk to self within the distant past? No   Is the patient a risk to others? No   Has the patient been a risk to others in  the past 6 months? No   Has the patient been a risk to others within the distant past? No   Past Medical  History:  Past Medical History:  Diagnosis Date  . Depression   . Schizophrenia [facility])     Past Surgical History:  Procedure Laterality Date  . BACK SURGERY      Family History: History reviewed. No pertinent family history.  Social History:  Social History   Socioeconomic History  . Marital status: Single    Spouse name: Not on file  . Number of children: Not on file  . Years of education: Not on file  . Highest education level: Not on file  Occupational History  . Occupation: unemployed  Tobacco Use  . Smoking status: Current Some Day Smoker    Types: Cigarettes  . Smokeless tobacco: Never Used  Vaping Use  . Vaping Use: Never used  Substance and Sexual Activity  . Alcohol use: No  . Drug use: Yes    Types: Marijuana    Comment: rare  . Sexual activity: Yes    Birth control/protection: Condom  Other Topics Concern  . Not on file  Social History Narrative   UTA certain topics - tangential thought process, cannot answer clearly. Pt not sure about school; reportedly is homeless.    Social Determinants of Health   Financial Resource Strain:   . Difficulty of Paying Living Expenses: Not on file  Food Insecurity:   . Worried About Programme researcher, broadcasting/film/video in the Last Year: Not on file  . Ran Out of Food in the Last Year: Not on file  Transportation Needs:   . Lack of Transportation (Medical): Not on file  . Lack of Transportation (Non-Medical): Not on file  Physical Activity:   . Days of Exercise per Week: Not on file  . Minutes of Exercise per Session: Not on file  Stress:   . Feeling of Stress : Not on file  Social Connections:   . Frequency of Communication with Friends and Family: Not on file  . Frequency of Social Gatherings with Friends and Family: Not on file  . Attends Religious Services: Not on file  . Active Member of Clubs or Organizations: Not on file  . Attends Banker Meetings: Not on file  . Marital Status: Not on file  Intimate  Partner Violence:   . Fear of Current or Ex-Partner: Not on file  . Emotionally Abused: Not on file  . Physically Abused: Not on file  . Sexually Abused: Not on file    SDOH:  SDOH Screenings   Alcohol Screen: Low Risk   . Last Alcohol Screening Score (AUDIT): 0  Depression (PHQ2-9): Medium Risk  . PHQ-2 Score: 6  Financial Resource Strain:   . Difficulty of Paying Living Expenses: Not on file  Food Insecurity:   . Worried About Programme researcher, broadcasting/film/video in the Last Year: Not on file  . Ran Out of Food in the Last Year: Not on file  Housing:   . Last Housing Risk Score: Not on file  Physical Activity:   . Days of Exercise per Week: Not on file  . Minutes of Exercise per Session: Not on file  Social Connections:   . Frequency of Communication with Friends and Family: Not on file  . Frequency of Social Gatherings with Friends and Family: Not on file  . Attends Religious Services: Not on file  . Active Member of Clubs or Organizations: Not on  file  . Attends BankerClub or Organization Meetings: Not on file  . Marital Status: Not on file  Stress:   . Feeling of Stress : Not on file  Tobacco Use: High Risk  . Smoking Tobacco Use: Current Some Day Smoker  . Smokeless Tobacco Use: Never Used  Transportation Needs:   . Freight forwarderLack of Transportation (Medical): Not on file  . Lack of Transportation (Non-Medical): Not on file    Last Labs:  Admission on 06/27/2020, Discharged on 06/27/2020  Component Date Value Ref Range Status  . WBC 06/27/2020 5.6  4.0 - 10.5 K/uL Final  . RBC 06/27/2020 4.38  4.22 - 5.81 MIL/uL Final  . Hemoglobin 06/27/2020 13.7  13.0 - 17.0 g/dL Final  . HCT 16/10/960409/19/2021 39.4  39 - 52 % Final  . MCV 06/27/2020 90.0  80.0 - 100.0 fL Final  . MCH 06/27/2020 31.3  26.0 - 34.0 pg Final  . MCHC 06/27/2020 34.8  30.0 - 36.0 g/dL Final  . RDW 54/09/811909/19/2021 12.9  11.5 - 15.5 % Final  . Platelets 06/27/2020 204  150 - 400 K/uL Final  . nRBC 06/27/2020 0.0  0.0 - 0.2 % Final    Performed at [facility], [address] [place], [place] [address]  . Sodium 06/27/2020 138  135 - 145 mmol/L Final  . Potassium 06/27/2020 4.0  3.5 - 5.1 mmol/L Final  . Chloride 06/27/2020 102  98 - 111 mmol/L Final  . CO2 06/27/2020 28  22 - 32 mmol/L Final  . Glucose, Bld 06/27/2020 92  70 - 99 mg/dL Final   Glucose reference range applies only to samples taken after fasting for at least 8 hours.  . BUN 06/27/2020 14  6 - 20 mg/dL Final  . Creatinine, Ser 06/27/2020 1.30* 0.61 - 1.24 mg/dL Final  . Calcium 95/62/130809/19/2021 8.9  8.9 - 10.3 mg/dL Final  . Total Protein 06/27/2020 6.7  6.5 - 8.1 g/dL Final  . Albumin 65/78/469609/19/2021 4.2  3.5 - 5.0 g/dL Final  . AST 29/52/841309/19/2021 17  15 - 41 U/L Final  . ALT 06/27/2020 12  0 - 44 U/L Final  . Alkaline Phosphatase 06/27/2020 32* 38 - 126 U/L Final  . Total Bilirubin 06/27/2020 0.6  0.3 - 1.2 mg/dL Final  . GFR calc non Af Amer 06/27/2020 >60  >60 mL/min Final  . GFR calc Af Amer 06/27/2020 >60  >60 mL/min Final  . Anion gap 06/27/2020 8  5 - 15 Final   Performed at [facility], [address] [place], [place] [address]  . Alcohol, Ethyl (B) 06/27/2020 <10  <10 mg/dL Final   Comment: (NOTE) Lowest detectable limit for serum alcohol is 10 mg/dL.  For medical purposes only. Performed at [facility], [address] [place], [place] [address]   . Salicylate Lvl 06/27/2020 <7.0* 7.0 - 30.0 mg/dL Final   Performed at [facility], [address] [place], [place] [address]  . Acetaminophen (Tylenol), Serum 06/27/2020 <10* 10 - 30 ug/mL Final   Comment: (NOTE) Therapeutic concentrations vary significantly. A range of 10-30 ug/mL  may be an effective concentration for many patients. However, some  are best treated at concentrations outside of this range. Acetaminophen concentrations >150 ug/mL at 4 hours after ingestion  and >50 ug/mL at 12 hours after ingestion are often  associated with  toxic reactions.  Performed at [facility], [address] [place], [place] [address]   .  SARS Coronavirus 2 06/27/2020 NEGATIVE  NEGATIVE Final   Comment: (NOTE) SARS-CoV-2 target nucleic acids are NOT DETECTED.  The SARS-CoV-2 RNA is generally detectable in upper and lower respiratory specimens during the acute phase of infection. The lowest concentration of SARS-CoV-2 viral copies this assay can detect is 250 copies / mL. A negative result does not preclude SARS-CoV-2 infection and should not be used as the sole basis for treatment or other patient management decisions.  A negative result may occur with improper specimen collection / handling, submission of specimen other than nasopharyngeal swab, presence of viral mutation(s) within the areas targeted by this assay, and inadequate number of viral copies (<250 copies / mL). A negative result must be combined with clinical observations, patient history, and epidemiological information.  Fact Sheet for Patients:   BoilerBrush.com.cy  Fact Sheet for Healthcare Providers: https://pope.com/  This test is not yet approved or                           cleared by the [place] FDA and has been authorized for detection and/or diagnosis of SARS-CoV-2 by FDA under an Emergency Use Authorization (EUA).  This EUA will remain in effect (meaning this test can be used) for the duration of the COVID-19 declaration under Section 564(b)(1) of the Act, 21 U.S.C. section 360bbb-3(b)(1), unless the authorization is terminated or revoked sooner.  Performed at [facility], [address], [place], [place] [address]   Admission on 06/17/2020, Discharged on 06/17/2020  Component Date Value Ref Range Status  . Sodium 06/17/2020 139  135 - 145 mmol/L Final  . Potassium 06/17/2020 3.5  3.5 - 5.1 mmol/L Final  . Chloride 06/17/2020 104  98 -  111 mmol/L Final  . CO2 06/17/2020 23  22 - 32 mmol/L Final  . Glucose, Bld 06/17/2020 109* 70 - 99 mg/dL Final   Glucose reference range applies only to samples taken after fasting for at least 8 hours.  . BUN 06/17/2020 10  6 - 20 mg/dL Final  . Creatinine, Ser 06/17/2020 1.22  0.61 - 1.24 mg/dL Final  . Calcium 65/78/4696 9.3  8.9 - 10.3 mg/dL Final  . Total Protein 06/17/2020 6.0* 6.5 - 8.1 g/dL Final  . Albumin 29/52/8413 4.1  3.5 - 5.0 g/dL Final  . AST 24/40/1027 21  15 - 41 U/L Final  . ALT 06/17/2020 13  0 - 44 U/L Final  . Alkaline Phosphatase 06/17/2020 31* 38 - 126 U/L Final  . Total Bilirubin 06/17/2020 0.9  0.3 - 1.2 mg/dL Final  . GFR calc non Af Amer 06/17/2020 >60  >60 mL/min Final  . GFR calc Af Amer 06/17/2020 >60  >60 mL/min Final  . Anion gap 06/17/2020 12  5 - 15 Final   Performed at [facility], [address] [place], [place] [address]  . Alcohol, Ethyl (B) 06/17/2020 <10  <10 mg/dL Final   Comment: (NOTE) Lowest detectable limit for serum alcohol is 10 mg/dL.  For medical purposes only. Performed at [facility], [address] [place], [place] [address]   . Salicylate Lvl 06/17/2020 <7.0* 7.0 - 30.0 mg/dL Final   Performed at [facility], [address] [place], [place] [address]  . Acetaminophen (Tylenol), Serum 06/17/2020 <10* 10 - 30 ug/mL Final   Comment: (NOTE) Therapeutic concentrations vary significantly. A range of 10-30 ug/mL  may be an effective concentration for many patients.  However, some  are best treated at concentrations outside of this range. Acetaminophen concentrations >150 ug/mL at 4 hours after ingestion  and >50 ug/mL at 12 hours after ingestion are often associated with  toxic reactions.  Performed at [facility], [address] [place], [place] [address]   . WBC 06/17/2020 5.6  4.0 - 10.5 K/uL Final  . RBC 06/17/2020 4.58  4.22 - 5.81 MIL/uL Final  . Hemoglobin 06/17/2020 13.7  13.0 - 17.0 g/dL  Final  . HCT 60/45/4098 41.5  39 - 52 % Final  . MCV 06/17/2020 90.6  80.0 - 100.0 fL Final  . MCH 06/17/2020 29.9  26.0 - 34.0 pg Final  . MCHC 06/17/2020 33.0  30.0 - 36.0 g/dL Final  . RDW 11/91/4782 13.1  11.5 - 15.5 % Final  . Platelets 06/17/2020 217  150 - 400 K/uL Final  . nRBC 06/17/2020 0.0  0.0 - 0.2 % Final   Performed at [facility], [address] [place], [place] [address]  Admission on 06/11/2020, Discharged on 06/11/2020  Component Date Value Ref Range Status  . Sodium 06/11/2020 140  135 - 145 mmol/L Final  . Potassium 06/11/2020 3.6  3.5 - 5.1 mmol/L Final  . Chloride 06/11/2020 102  98 - 111 mmol/L Final  . CO2 06/11/2020 29  22 - 32 mmol/L Final  . Glucose, Bld 06/11/2020 88  70 - 99 mg/dL Final   Glucose reference range applies only to samples taken after fasting for at least 8 hours.  . BUN 06/11/2020 16  6 - 20 mg/dL Final  . Creatinine, Ser 06/11/2020 1.02  0.61 - 1.24 mg/dL Final  . Calcium 30/86/5784 9.3  8.9 - 10.3 mg/dL Final  . Total Protein 06/11/2020 6.8  6.5 - 8.1 g/dL Final  . Albumin 69/62/9528 4.6  3.5 - 5.0 g/dL Final  . AST 41/32/4401 18  15 - 41 U/L Final  . ALT 06/11/2020 12  0 - 44 U/L Final  . Alkaline Phosphatase 06/11/2020 35* 38 - 126 U/L Final  . Total Bilirubin 06/11/2020 1.3* 0.3 - 1.2 mg/dL Final  . GFR calc non Af Amer 06/11/2020 >60  >60 mL/min Final  . GFR calc Af Amer 06/11/2020 >60  >60 mL/min Final  . Anion gap 06/11/2020 9  5 - 15 Final   Performed at [facility], [address], [place], [place] [address]  . Alcohol, Ethyl (B) 06/11/2020 <10  <10 mg/dL Final   Comment: (NOTE) Lowest detectable limit for serum alcohol is 10 mg/dL.  For medical purposes only. Performed at [facility], [address] [place], [place] [address]   . WBC 06/11/2020 6.3  4.0 - 10.5 K/uL Final  . RBC 06/11/2020 4.80  4.22 - 5.81 MIL/uL Final  . Hemoglobin 06/11/2020 14.8  13.0 - 17.0 g/dL Final   . HCT 03/47/4259 43.1  39 - 52 % Final  . MCV 06/11/2020 89.8  80.0 - 100.0 fL Final  . MCH 06/11/2020 30.8  26.0 - 34.0 pg Final  . MCHC 06/11/2020 34.3  30.0 - 36.0 g/dL Final  . RDW 56/38/7564 12.9  11.5 - 15.5 % Final  . Platelets 06/11/2020 226  150 - 400 K/uL Final  . nRBC 06/11/2020 0.3* 0.0 - 0.2 % Final   Performed at [facility], [address], [place], [place] [address]  Admission on 05/24/2020, Discharged on 05/25/2020  Component Date Value Ref Range Status  .  SARS Coronavirus 2 05/24/2020 NEGATIVE  NEGATIVE Final   Comment: (NOTE) SARS-CoV-2 target nucleic acids are NOT DETECTED.  The SARS-CoV-2 RNA is generally detectable in upper and lower respiratory specimens during the acute phase of infection. The lowest concentration of SARS-CoV-2 viral copies this assay can detect is 250 copies / mL. A negative result does not preclude SARS-CoV-2 infection and should not be used as the sole basis for treatment or other patient management decisions.  A negative result may occur with improper specimen collection / handling, submission of specimen other than nasopharyngeal swab, presence of viral mutation(s) within the areas targeted by this assay, and inadequate number of viral copies (<250 copies / mL). A negative result must be combined with clinical observations, patient history, and epidemiological information.  Fact Sheet for Patients:   BoilerBrush.com.cy  Fact Sheet for Healthcare Providers: https://pope.com/  This test is not yet approved or                           cleared by the [place] FDA and has been authorized for detection and/or diagnosis of SARS-CoV-2 by FDA under an Emergency Use Authorization (EUA).  This EUA will remain in effect (meaning this test can be used) for the duration of the COVID-19 declaration under Section 564(b)(1) of the Act, 21 U.S.C. section 360bbb-3(b)(1), unless  the authorization is terminated or revoked sooner.  Performed at [facility], [address] [place], [place] [address]   . WBC 05/24/2020 5.6  4.0 - 10.5 K/uL Final  . RBC 05/24/2020 4.60  4.22 - 5.81 MIL/uL Final  . Hemoglobin 05/24/2020 14.1  13.0 - 17.0 g/dL Final  . HCT 97/94/8016 41.3  39 - 52 % Final  . MCV 05/24/2020 89.8  80.0 - 100.0 fL Final  . MCH 05/24/2020 30.7  26.0 - 34.0 pg Final  . MCHC 05/24/2020 34.1  30.0 - 36.0 g/dL Final  . RDW 55/37/4827 12.5  11.5 - 15.5 % Final  . Platelets 05/24/2020 219  150 - 400 K/uL Final  . nRBC 05/24/2020 0.0  0.0 - 0.2 % Final  . Neutrophils Relative % 05/24/2020 34  % Final  . Neutro Abs 05/24/2020 1.9  1.7 - 7.7 K/uL Final  . Lymphocytes Relative 05/24/2020 55  % Final  . Lymphs Abs 05/24/2020 3.1  0.7 - 4.0 K/uL Final  . Monocytes Relative 05/24/2020 5  % Final  . Monocytes Absolute 05/24/2020 0.3  0 - 1 K/uL Final  . Eosinophils Relative 05/24/2020 5  % Final  . Eosinophils Absolute 05/24/2020 0.3  0 - 0 K/uL Final  . Basophils Relative 05/24/2020 1  % Final  . Basophils Absolute 05/24/2020 0.0  0 - 0 K/uL Final  . Immature Granulocytes 05/24/2020 0  % Final  . Abs Immature Granulocytes 05/24/2020 0.01  0.00 - 0.07 K/uL Final   Performed at [facility], [address], [place], [place] [address]  . Sodium 05/24/2020 140  135 - 145 mmol/L Final  . Potassium 05/24/2020 3.7  3.5 - 5.1 mmol/L Final  . Chloride 05/24/2020 101  98 - 111 mmol/L Final  . CO2 05/24/2020 27  22 - 32 mmol/L Final  . Glucose, Bld 05/24/2020 101* 70 - 99 mg/dL Final   Glucose reference range applies only to samples taken after fasting for at least 8 hours.  . BUN 05/24/2020 14  6 - 20 mg/dL Final  . Creatinine, Ser 05/24/2020  1.29* 0.61 - 1.24 mg/dL Final  . Calcium 81/19/1478 9.6  8.9 - 10.3 mg/dL Final  . Total Protein 05/24/2020 6.4* 6.5 - 8.1 g/dL Final  . Albumin 29/56/2130 4.1  3.5 - 5.0 g/dL Final  . AST 86/57/8469 18  15 - 41 U/L  Final  . ALT 05/24/2020 12  0 - 44 U/L Final  . Alkaline Phosphatase 05/24/2020 37* 38 - 126 U/L Final  . Total Bilirubin 05/24/2020 0.6  0.3 - 1.2 mg/dL Final  . GFR calc non Af Amer 05/24/2020 >60  >60 mL/min Final  . GFR calc Af Amer 05/24/2020 >60  >60 mL/min Final  . Anion gap 05/24/2020 12  5 - 15 Final   Performed at [facility], [address] [place], [place] [address]  . SARS Coronavirus 2 Ag 05/25/2020 NEGATIVE  NEGATIVE Final   Comment: (NOTE) SARS-CoV-2 antigen NOT DETECTED.   Negative results are presumptive.  Negative results do not preclude SARS-CoV-2 infection and should not be used as the sole basis for treatment or other patient management decisions, including infection  control decisions, particularly in the presence of clinical signs and  symptoms consistent with COVID-19, or in those who have been in contact with the virus.  Negative results must be combined with clinical observations, patient history, and epidemiological information. The expected result is Negative.  Fact Sheet for Patients: https://sanders-williams.net/  Fact Sheet for Healthcare Providers: https://martinez.com/   This test is not yet approved or cleared by the [place] FDA and  has been authorized for detection and/or diagnosis of SARS-CoV-2 by FDA under an Emergency Use Authorization (EUA).  This EUA will remain in effect (meaning this test can be used) for the duration of  the C                          OVID-19 declaration under Section 564(b)(1) of the Act, 21 U.S.C. section 360bbb-3(b)(1), unless the authorization is terminated or revoked sooner.    Admission on 05/08/2020, Discharged on 05/08/2020  Component Date Value Ref Range Status  . WBC 05/08/2020 5.3  4.0 - 10.5 K/uL Final  . RBC 05/08/2020 4.78  4.22 - 5.81 MIL/uL Final  . Hemoglobin 05/08/2020 14.8  13.0 - 17.0 g/dL Final  . HCT 84/13/2440 43.5  39 - 52 % Final  . MCV  05/08/2020 91.0  80.0 - 100.0 fL Final  . MCH 05/08/2020 31.0  26.0 - 34.0 pg Final  . MCHC 05/08/2020 34.0  30.0 - 36.0 g/dL Final  . RDW 08/05/2535 12.5  11.5 - 15.5 % Final  . Platelets 05/08/2020 214  150 - 400 K/uL Final  . nRBC 05/08/2020 0.0  0.0 - 0.2 % Final  . Neutrophils Relative % 05/08/2020 27  % Final  . Neutro Abs 05/08/2020 1.5* 1.7 - 7.7 K/uL Final  . Lymphocytes Relative 05/08/2020 60  % Final  . Lymphs Abs 05/08/2020 3.2  0.7 - 4.0 K/uL Final  . Monocytes Relative 05/08/2020 6  % Final  . Monocytes Absolute 05/08/2020 0.3  0 - 1 K/uL Final  . Eosinophils Relative 05/08/2020 6  % Final  . Eosinophils Absolute 05/08/2020 0.3  0 - 0 K/uL Final  . Basophils Relative 05/08/2020 1  % Final  . Basophils Absolute 05/08/2020 0.0  0 - 0 K/uL Final  . Immature Granulocytes 05/08/2020 0  % Final  . Abs Immature Granulocytes 05/08/2020 0.01  0.00 - 0.07 K/uL Final  Performed at [facility], [address], [place], [place] [address]  . Sodium 05/08/2020 139  135 - 145 mmol/L Final  . Potassium 05/08/2020 3.5  3.5 - 5.1 mmol/L Final  . Chloride 05/08/2020 103  98 - 111 mmol/L Final  . CO2 05/08/2020 26  22 - 32 mmol/L Final  . Glucose, Bld 05/08/2020 97  70 - 99 mg/dL Final   Glucose reference range applies only to samples taken after fasting for at least 8 hours.  . BUN 05/08/2020 13  6 - 20 mg/dL Final  . Creatinine, Ser 05/08/2020 1.33* 0.61 - 1.24 mg/dL Final  . Calcium 60/45/4098 9.3  8.9 - 10.3 mg/dL Final  . Total Protein 05/08/2020 6.6  6.5 - 8.1 g/dL Final  . Albumin 11/91/4782 4.2  3.5 - 5.0 g/dL Final  . AST 95/62/1308 22  15 - 41 U/L Final  . ALT 05/08/2020 14  0 - 44 U/L Final  . Alkaline Phosphatase 05/08/2020 34* 38 - 126 U/L Final  . Total Bilirubin 05/08/2020 1.0  0.3 - 1.2 mg/dL Final  . GFR calc non Af Amer 05/08/2020 >60  >60 mL/min Final  . GFR calc Af Amer 05/08/2020 >60  >60 mL/min Final  . Anion gap 05/08/2020 10  5 - 15 Final   Performed at  [facility], [address], [place], [place] [address]  . POC Amphetamine UR 05/08/2020 None Detected  None Detected Preliminary  . POC Secobarbital (BAR) 05/08/2020 None Detected  None Detected Preliminary  . POC Buprenorphine (BUP) 05/08/2020 None Detected  None Detected Preliminary  . POC Oxazepam (BZO) 05/08/2020 None Detected  None Detected Preliminary  . POC Cocaine UR 05/08/2020 None Detected  None Detected Preliminary  . POC Methamphetamine UR 05/08/2020 None Detected  None Detected Preliminary  . POC Morphine 05/08/2020 None Detected  None Detected Preliminary  . POC Oxycodone UR 05/08/2020 None Detected  None Detected Preliminary  . POC Methadone UR 05/08/2020 None Detected  None Detected Preliminary  . POC Marijuana UR 05/08/2020 Positive* None Detected Preliminary  . SARS Coronavirus 2 Ag 05/08/2020 NEGATIVE  NEGATIVE Final   Comment: (NOTE) SARS-CoV-2 antigen NOT DETECTED.   Negative results are presumptive.  Negative results do not preclude SARS-CoV-2 infection and should not be used as the sole basis for treatment or other patient management decisions, including infection  control decisions, particularly in the presence of clinical signs and  symptoms consistent with COVID-19, or in those who have been in contact with the virus.  Negative results must be combined with clinical observations, patient history, and epidemiological information. The expected result is Negative.  Fact Sheet for Patients: https://sanders-williams.net/  Fact Sheet for Healthcare Providers: https://martinez.com/   This test is not yet approved or cleared by the [place] FDA and  has been authorized for detection and/or diagnosis of SARS-CoV-2 by FDA under an Emergency Use Authorization (EUA).  This EUA will remain in effect (meaning this test can be used) for the duration of  the C                          OVID-19 declaration under Section 564(b)(1)  of the Act, 21 U.S.C. section 360bbb-3(b)(1), unless the authorization is terminated or revoked sooner.    Admission on 02/19/2020, Discharged on 02/22/2020  Component Date Value Ref Range Status  . SARS Coronavirus 2 02/20/2020 NEGATIVE  NEGATIVE Final   Comment: (NOTE) SARS-CoV-2 target nucleic acids  are NOT DETECTED. The SARS-CoV-2 RNA is generally detectable in upper and lower respiratory specimens during the acute phase of infection. The lowest concentration of SARS-CoV-2 viral copies this assay can detect is 250 copies / mL. A negative result does not preclude SARS-CoV-2 infection and should not be used as the sole basis for treatment or other patient management decisions.  A negative result may occur with improper specimen collection / handling, submission of specimen other than nasopharyngeal swab, presence of viral mutation(s) within the areas targeted by this assay, and inadequate number of viral copies (<250 copies / mL). A negative result must be combined with clinical observations, patient history, and epidemiological information. Fact Sheet for Patients:   BoilerBrush.com.cy Fact Sheet for Healthcare Providers: https://pope.com/ This test is not yet approved or cleared                           by the [place] FDA and has been authorized for detection and/or diagnosis of SARS-CoV-2 by FDA under an Emergency Use Authorization (EUA).  This EUA will remain in effect (meaning this test can be used) for the duration of the COVID-19 declaration under Section 564(b)(1) of the Act, 21 U.S.C. section 360bbb-3(b)(1), unless the authorization is terminated or revoked sooner. Performed at [facility], [address], [place], [place] [address]   . Acetaminophen (Tylenol), Serum 02/19/2020 <10* 10 - 30 ug/mL Final   Comment: (NOTE) Therapeutic concentrations vary significantly. A range of 10-30 ug/mL  may  be an effective concentration for many patients. However, some  are best treated at concentrations outside of this range. Acetaminophen concentrations >150 ug/mL at 4 hours after ingestion  and >50 ug/mL at 12 hours after ingestion are often associated with  toxic reactions. Performed at [facility], [address] [place], [place] [address]   . Sodium 02/19/2020 139  135 - 145 mmol/L Final  . Potassium 02/19/2020 4.1  3.5 - 5.1 mmol/L Final  . Chloride 02/19/2020 103  98 - 111 mmol/L Final  . CO2 02/19/2020 28  22 - 32 mmol/L Final  . Glucose, Bld 02/19/2020 84  70 - 99 mg/dL Final   Glucose reference range applies only to samples taken after fasting for at least 8 hours.  . BUN 02/19/2020 15  6 - 20 mg/dL Final  . Creatinine, Ser 02/19/2020 1.04  0.61 - 1.24 mg/dL Final  . Calcium 36/64/4034 9.2  8.9 - 10.3 mg/dL Final  . Total Protein 02/19/2020 6.4* 6.5 - 8.1 g/dL Final  . Albumin 74/25/9563 4.0  3.5 - 5.0 g/dL Final  . AST 87/56/4332 18  15 - 41 U/L Final  . ALT 02/19/2020 13  0 - 44 U/L Final  . Alkaline Phosphatase 02/19/2020 36* 38 - 126 U/L Final  . Total Bilirubin 02/19/2020 0.8  0.3 - 1.2 mg/dL Final  . GFR calc non Af Amer 02/19/2020 >60  >60 mL/min Final  . GFR calc Af Amer 02/19/2020 >60  >60 mL/min Final  . Anion gap 02/19/2020 8  5 - 15 Final   Performed at [facility], [address], [place], [place] [address]  . Alcohol, Ethyl (B) 02/19/2020 <10  <10 mg/dL Final   Comment: (NOTE) Lowest detectable limit for serum alcohol is 10 mg/dL. For medical purposes only. Performed at [facility], [address] [place], [place] [address]   . WBC 02/19/2020 5.5  4.0 - 10.5 K/uL  Final  . RBC 02/19/2020 4.45  4.22 - 5.81 MIL/uL Final  . Hemoglobin 02/19/2020 13.9  13.0 - 17.0 g/dL Final  . HCT 02/54/2706 40.5  39 - 52 % Final  . MCV 02/19/2020 91.0  80.0 - 100.0 fL Final  . MCH 02/19/2020 31.2  26.0 - 34.0 pg  Final  . MCHC 02/19/2020 34.3  30.0 - 36.0 g/dL Final  . RDW 23/76/2831 13.0  11.5 - 15.5 % Final  . Platelets 02/19/2020 204  150 - 400 K/uL Final  . nRBC 02/19/2020 0.0  0.0 - 0.2 % Final  . Neutrophils Relative % 02/19/2020 29  % Final  . Neutro Abs 02/19/2020 1.6* 1.7 - 7.7 K/uL Final  . Lymphocytes Relative 02/19/2020 54  % Final  . Lymphs Abs 02/19/2020 3.0  0.7 - 4.0 K/uL Final  . Monocytes Relative 02/19/2020 7  % Final  . Monocytes Absolute 02/19/2020 0.4  0 - 1 K/uL Final  . Eosinophils Relative 02/19/2020 9  % Final  . Eosinophils Absolute 02/19/2020 0.5  0 - 0 K/uL Final  . Basophils Relative 02/19/2020 1  % Final  . Basophils Absolute 02/19/2020 0.0  0 - 0 K/uL Final  . Immature Granulocytes 02/19/2020 0  % Final  . Abs Immature Granulocytes 02/19/2020 0.01  0.00 - 0.07 K/uL Final   Performed at [facility], [address] [place], [place] [address]  . Color, Urine 02/20/2020 YELLOW  YELLOW Final  . APPearance 02/20/2020 CLEAR  CLEAR Final  . Specific Gravity, Urine 02/20/2020 1.023  1.005 - 1.030 Final  . pH 02/20/2020 6.0  5.0 - 8.0 Final  . Glucose, UA 02/20/2020 NEGATIVE  NEGATIVE mg/dL Final  . Hgb urine dipstick 02/20/2020 NEGATIVE  NEGATIVE Final  . Bilirubin Urine 02/20/2020 NEGATIVE  NEGATIVE Final  . Ketones, ur 02/20/2020 NEGATIVE  NEGATIVE mg/dL Final  . Protein, ur 60/73/7106 NEGATIVE  NEGATIVE mg/dL Final  . Nitrite 26/94/8546 NEGATIVE  NEGATIVE Final  . Glori Luis 02/20/2020 NEGATIVE  NEGATIVE Final   Performed at [facility], [address], [place], [place] [address]  . Opiates 02/20/2020 NONE DETECTED  NONE DETECTED Final  . Cocaine 02/20/2020 NONE DETECTED  NONE DETECTED Final  . Benzodiazepines 02/20/2020 NONE DETECTED  NONE DETECTED Final  . Amphetamines 02/20/2020 NONE DETECTED  NONE DETECTED Final  . Tetrahydrocannabinol 02/20/2020 POSITIVE* NONE DETECTED Final  . Barbiturates 02/20/2020 NONE DETECTED   NONE DETECTED Final   Comment: (NOTE) DRUG SCREEN FOR MEDICAL PURPOSES ONLY.  IF CONFIRMATION IS NEEDED FOR ANY PURPOSE, NOTIFY LAB WITHIN 5 DAYS. LOWEST DETECTABLE LIMITS FOR URINE DRUG SCREEN Drug Class                     Cutoff (ng/mL) Amphetamine and metabolites    1000 Barbiturate and metabolites    200 Benzodiazepine                 200 Tricyclics and metabolites     300 Opiates and metabolites        300 Cocaine and metabolites        300 THC                            50 Performed at [facility], [address] [place], [place] [address]   Admission on 01/05/2020, Discharged on 01/08/2020  Component Date Value Ref Range Status  . SARS Coronavirus 2 by  RT PCR 01/05/2020 NEGATIVE  NEGATIVE Final   Comment: (NOTE) SARS-CoV-2 target nucleic acids are NOT DETECTED. The SARS-CoV-2 RNA is generally detectable in upper respiratoy specimens during the acute phase of infection. The lowest concentration of SARS-CoV-2 viral copies this assay can detect is 131 copies/mL. A negative result does not preclude SARS-Cov-2 infection and should not be used as the sole basis for treatment or other patient management decisions. A negative result may occur with  improper specimen collection/handling, submission of specimen other than nasopharyngeal swab, presence of viral mutation(s) within the areas targeted by this assay, and inadequate number of viral copies (<131 copies/mL). A negative result must be combined with clinical observations, patient history, and epidemiological information. The expected result is Negative. Fact Sheet for Patients:  https://www.moore.com/ Fact Sheet for Healthcare Providers:  https://www.young.biz/ This test is not yet ap                          proved or cleared by the [place] FDA and  has been authorized for detection and/or diagnosis of SARS-CoV-2 by FDA under an Emergency Use  Authorization (EUA). This EUA will remain  in effect (meaning this test can be used) for the duration of the COVID-19 declaration under Section 564(b)(1) of the Act, 21 U.S.C. section 360bbb-3(b)(1), unless the authorization is terminated or revoked sooner.   . Influenza A by PCR 01/05/2020 NEGATIVE  NEGATIVE Final  . Influenza B by PCR 01/05/2020 NEGATIVE  NEGATIVE Final   Comment: (NOTE) The Xpert Xpress SARS-CoV-2/FLU/RSV assay is intended as an aid in  the diagnosis of influenza from Nasopharyngeal swab specimens and  should not be used as a sole basis for treatment. Nasal washings and  aspirates are unacceptable for Xpert Xpress SARS-CoV-2/FLU/RSV  testing. Fact Sheet for Patients: https://www.moore.com/ Fact Sheet for Healthcare Providers: https://www.young.biz/ This test is not yet approved or cleared by the [place] FDA and  has been authorized for detection and/or diagnosis of SARS-CoV-2 by  FDA under an Emergency Use Authorization (EUA). This EUA will remain  in effect (meaning this test can be used) for the duration of the  Covid-19 declaration under Section 564(b)(1) of the Act, 21  U.S.C. section 360bbb-3(b)(1), unless the authorization is  terminated or revoked. Performed at [facility], [address] [place], [place] [address]     Allergies: Shellfish allergy  PTA Medications: (Not in a hospital admission)   Medical Decision Making  The patient will remain under observation overnight and reassess in the A.M. to determine if he meets the criteria for psychiatric inpatient admission or can be discharged. Recommendations  Based on my evaluation the patient does not appear to have an emergency medical condition.  Gillermo Murdoch, NP 06/28/20  1:35 AM

## 2020-06-28 NOTE — ED Provider Notes (Signed)
FBC/OBS ASAP Discharge Summary  Date and Time: 06/28/2020 5:52 PM  Name: Adrian GivensMarlo Meller  MRN:  [id number]   Discharge Diagnoses:  Final diagnoses:  Schizophrenia, paranoid type (HCC)    Subjective:  [name] is a 29 year old, male with a PmHx/PPsychHx significant for schizophrenia (paranoid type) and cannabis use disorder (moderate dependence) who [facility]. Patient states that he is doing pretty good today. Patient endorses good sleep and appetite. Patient denies suicide ideation, homicide ideation, and auditory or visual hallucinations. Patient is comfortable with being discharged and can contract for safety.  Stay Summary:  Behavioral Health Admission H&P 06/28/2020: Adrian GivensMarlo Erway is a 29 y.o. male who [facility] voluntarily and [facility]. The patient is a poor historian and irritated during the assessment. The patient voiced that he does not want to participate in the assessment process. He states, "I am tired. I do not want to answer any more questions." TTS clinician asked the patient, "what brought you to the hospital?" Pt reported, "I can't breathe. I need medications to focus on reality." The patient does not appear to be responding to internal or external stimuli. Neither is the patient presenting with any delusional thinking. The patient denies auditory or visual hallucinations. The patient denies any suicidal, homicidal, or self-harm ideations. The patient is not presenting with any psychotic or paranoid behaviors. During an encounter with the patient, he was able to answer questions appropriately. The patient smokes marijuana every day. The patient plan is to being linked to OPT resources (medication management or counseling.)   The patient will remain under observation overnight and reassess in the A.M. to determine if he meets the criteria for psychiatric inpatient admission or can be discharged.  Adrian GivensMarlo Petrey  is a 29 year old, African American, male with a PmHx/PPsychHx significant for schizophrenia (paranoid type) and cannabis use disorder (moderate dependence) who [facility]. When [facility] had [facility], patient reported, "I can't breath I needs medications to focus on reality. Upon further evaluation it was determined the patient remain with [facility] for continuous assessment.  Patient was given the following medications during stay: - Benztropine (Cogentin) tablet 1 mg - Paliperidone (Invega) 24 hr tablet 6 mg  Today, patient denied suicide ideation, homicide ideation, and auditory or visual hallucinations. Patient is comfortable with being discharged and can contract for safety. Patient stated that he did not need psych follow-up. Patient was encouraged to follow up with outpatient services as recommended. Patient was setup with transportation services before discharge.  Total Time spent with patient: 30 minutes  Past Psychiatric History: Schizophrenia, paranoid type; cannabis use disorder, moderate dependence. Past Medical History:  Past Medical History:  Diagnosis Date  . Depression   . Schizophrenia [facility](HCC)     Past Surgical History:  Procedure Laterality Date  . BACK SURGERY     Family History: History reviewed. No pertinent family history. Family Psychiatric History: N/A Social History:  Social History   Substance and Sexual Activity  Alcohol Use No     Social History   Substance and Sexual Activity  Drug Use Yes  . Types: Marijuana   Comment: rare    Social History   Socioeconomic History  . Marital status: Single    Spouse name: Not on file  . Number of children: Not on file  . Years of education: Not on file  . Highest education level: Not on file  Occupational History  . Occupation:  unemployed  Tobacco Use  . Smoking status: Current Some Day Smoker    Types: Cigarettes  . Smokeless tobacco:  Never Used  Vaping Use  . Vaping Use: Never used  Substance and Sexual Activity  . Alcohol use: No  . Drug use: Yes    Types: Marijuana    Comment: rare  . Sexual activity: Yes    Birth control/protection: Condom  Other Topics Concern  . Not on file  Social History Narrative   UTA certain topics - tangential thought process, cannot answer clearly. Pt not sure about school; reportedly is homeless.    Social Determinants of Health   Financial Resource Strain:   . Difficulty of Paying Living Expenses: Not on file  Food Insecurity:   . Worried About Programme researcher, broadcasting/film/video in the Last Year: Not on file  . Ran Out of Food in the Last Year: Not on file  Transportation Needs:   . Lack of Transportation (Medical): Not on file  . Lack of Transportation (Non-Medical): Not on file  Physical Activity:   . Days of Exercise per Week: Not on file  . Minutes of Exercise per Session: Not on file  Stress:   . Feeling of Stress : Not on file  Social Connections:   . Frequency of Communication with Friends and Family: Not on file  . Frequency of Social Gatherings with Friends and Family: Not on file  . Attends Religious Services: Not on file  . Active Member of Clubs or Organizations: Not on file  . Attends Banker Meetings: Not on file  . Marital Status: Not on file   SDOH:  SDOH Screenings   Alcohol Screen: Low Risk   . Last Alcohol Screening Score (AUDIT): 0  Depression (PHQ2-9): Medium Risk  . PHQ-2 Score: 6  Financial Resource Strain:   . Difficulty of Paying Living Expenses: Not on file  Food Insecurity:   . Worried About Programme researcher, broadcasting/film/video in the Last Year: Not on file  . Ran Out of Food in the Last Year: Not on file  Housing:   . Last Housing Risk Score: Not on file  Physical Activity:   . Days of Exercise per Week: Not on file  . Minutes of Exercise per Session: Not on file  Social Connections:   . Frequency of Communication with Friends and Family: Not on file   . Frequency of Social Gatherings with Friends and Family: Not on file  . Attends Religious Services: Not on file  . Active Member of Clubs or Organizations: Not on file  . Attends Banker Meetings: Not on file  . Marital Status: Not on file  Stress:   . Feeling of Stress : Not on file  Tobacco Use: High Risk  . Smoking Tobacco Use: Current Some Day Smoker  . Smokeless Tobacco Use: Never Used  Transportation Needs:   . Freight forwarder (Medical): Not on file  . Lack of Transportation (Non-Medical): Not on file    Has this patient used any form of tobacco in the last 30 days? (Cigarettes, Smokeless Tobacco, Cigars, and/or Pipes) A prescription for an FDA-approved tobacco cessation medication was offered at discharge and the patient refused and Prescription not provided because: Patient did not request  Current Medications:  Current Facility-Administered Medications  Medication Dose Route Frequency Provider Last Rate Last Admin  . acetaminophen (TYLENOL) tablet 650 mg  650 mg Oral Q6H PRN Gillermo Murdoch, NP      .  alum & mag hydroxide-simeth (MAALOX/MYLANTA) 200-200-20 MG/5ML suspension 30 mL  30 mL Oral Q4H PRN Gillermo Murdoch, NP      . benztropine (COGENTIN) tablet 1 mg  1 mg Oral BID Money, Gerlene Burdock, FNP   1 mg at 06/28/20 0818  . magnesium hydroxide (MILK OF MAGNESIA) suspension 30 mL  30 mL Oral Daily PRN Gillermo Murdoch, NP      . paliperidone (INVEGA) 24 hr tablet 6 mg  6 mg Oral Daily Money, Gerlene Burdock, FNP   6 mg at 06/28/20 0818   No current outpatient medications on file.    PTA Medications: (Not in a hospital admission)   Musculoskeletal  Strength & Muscle Tone: within normal limits Gait & Station: normal Patient leans: N/A  Psychiatric Specialty Exam  Presentation  General Appearance: Appropriate for Environment  Eye Contact:Fair  Speech:Clear and Coherent;Normal Rate  Speech Volume:Normal  Handedness:Right   Mood and  Affect  Mood:Irritable (During encounter, patient states that he gets easily irritated)  Affect:Congruent   Thought Process  Thought Processes:Coherent  Descriptions of Associations:Intact  Orientation:Full (Time, Place and Person)  Thought Content:WDL  Hallucinations:Hallucinations: None  Ideas of Reference:None  Suicidal Thoughts:Suicidal Thoughts: No  Homicidal Thoughts:Homicidal Thoughts: No   Sensorium  Memory:Immediate Good;Recent Good;Remote Good  Judgment:Fair  Insight:Fair   Executive Functions  Concentration:Fair  Attention Span:Fair  Recall:Fair  Fund of Knowledge:Fair  Language:Good   Psychomotor Activity  Psychomotor Activity:Psychomotor Activity: Normal   Assets  Assets:Communication Skills;Financial Resources/Insurance;Housing   Sleep  Sleep:Sleep: Good   Physical Exam  Physical Exam Constitutional:      Appearance: Normal appearance.  HENT:     Head: Normocephalic and atraumatic.     Nose: Nose normal.  Eyes:     Extraocular Movements: Extraocular movements intact.     Pupils: Pupils are equal, round, and reactive to light.  Cardiovascular:     Rate and Rhythm: Normal rate and regular rhythm.  Pulmonary:     Effort: Pulmonary effort is normal.     Breath sounds: Normal breath sounds.  Abdominal:     General: Abdomen is flat. Bowel sounds are normal.     Palpations: Abdomen is soft.  Musculoskeletal:        General: Normal range of motion.     Cervical back: Normal range of motion and neck supple.  Skin:    General: Skin is warm and dry.  Neurological:     General: No focal deficit present.     Mental Status: He is alert and oriented to person, place, and time.  Psychiatric:        Mood and Affect: Mood normal.        Behavior: Behavior normal.    Review of Systems  Constitutional: Negative.   HENT: Negative.   Eyes: Negative.   Respiratory: Negative.   Cardiovascular: Negative.   Gastrointestinal: Negative.    Musculoskeletal: Negative.   Skin: Negative.   Neurological: Negative.   Endo/Heme/Allergies: Negative.   Psychiatric/Behavioral: Negative for depression, hallucinations and suicidal ideas. The patient does not have insomnia.    Blood pressure (!) 133/99, pulse (!) 58, temperature 97.6 F (36.4 C), temperature source Oral, resp. rate 18, height 5\' 5"  (1.651 m), weight 160 lb (72.6 kg), SpO2 100 %. Body mass index is 26.63 kg/m.  Demographic Factors:  Male and Living alone  Loss Factors: NA  Historical Factors: History of cannabis use disorder  Risk Reduction Factors:   NA  Continued Clinical Symptoms:  Alcohol/Substance Abuse/Dependencies  Schizophrenia:   Paranoid or undifferentiated type  Cognitive Features That Contribute To Risk:  None    Suicide Risk:  Mild:  Suicidal ideation of limited frequency, intensity, duration, and specificity.  There are no identifiable plans, no associated intent, mild dysphoria and related symptoms, good self-control (both objective and subjective assessment), few other risk factors, and identifiable protective factors, including available and accessible social support.  Plan Of Care/Follow-up recommendations:  Patient was advised and encouraged to contact [facility] center for outpatient psychiatric services for medication management and therapy services. Physical activity as recommended. Diet as recommended.  Disposition:  Based on my evaluation, the patient does not meet the criteria for being admitted to the emergency department or inpatient behavioral health services. Patient is being discharged with referral to psych outpatient follow-up.  Meta Hatchet, PA 06/28/2020, 5:52 PM

## 2020-06-28 NOTE — ED Notes (Signed)
AVS/Follow-Up reviewed with patient and copy given to patient and he verbalized understanding. Patient denies SI/HI and A/V/H at discharge. All patient belongings returned to him. Patient escorted off unit to meet safe transport driver to go home.

## 2020-06-28 NOTE — Discharge Instructions (Addendum)
Used resources provided to schedule follow-up appointments.  Patient was encouraged to follow-up with out patient services as recommended. Physical activity as recommended. Diet as recommended.

## 2020-07-01 ENCOUNTER — Emergency Department (HOSPITAL_COMMUNITY)
Admission: EM | Admit: 2020-07-01 | Discharge: 2020-07-01 | Disposition: A | Discharge status: Home / Self Care | Payer: Medicaid Other | Attending: Emergency Medicine | Admitting: Emergency Medicine

## 2020-07-01 ENCOUNTER — Other Ambulatory Visit: Payer: Self-pay

## 2020-07-01 ENCOUNTER — Encounter (HOSPITAL_COMMUNITY): Payer: Self-pay

## 2020-07-01 DIAGNOSIS — L299 Pruritus, unspecified: Secondary | ICD-10-CM | POA: Insufficient documentation

## 2020-07-01 DIAGNOSIS — F22 Delusional disorders: Secondary | ICD-10-CM | POA: Diagnosis not present

## 2020-07-01 DIAGNOSIS — F17210 Nicotine dependence, cigarettes, uncomplicated: Secondary | ICD-10-CM | POA: Insufficient documentation

## 2020-07-01 DIAGNOSIS — T7840XA Allergy, unspecified, initial encounter: Secondary | ICD-10-CM | POA: Diagnosis present

## 2020-07-01 NOTE — ED Provider Notes (Signed)
[facility]-EMERGENCY DEPT Provider Note: Lowella Dell, MD, FACEP  CSN: [id number] MRN: [id number] ARRIVAL: 07/01/20 at 0129 ROOM: [facility]   CHIEF COMPLAINT  Allergic Reaction   HISTORY OF PRESENT ILLNESS  07/01/20 1:57 AM [name] is a 29 y.o. male who developed itching and hives about 1 AM.  He does not know what triggered this.  Symptoms were moderate and not associated with difficulty breathing, nausea, vomiting or diarrhea.  He was given 50 mg of Benadryl orally by EMS prior to arrival.  He states the Benadryl did not help.  The patient tells me he does not want to be touched in any way.   Past Medical History:  Diagnosis Date  . Depression   . Schizophrenia [facility])     Past Surgical History:  Procedure Laterality Date  . BACK SURGERY      No family history on file.  Social History   Tobacco Use  . Smoking status: Current Some Day Smoker    Types: Cigarettes  . Smokeless tobacco: Never Used  Vaping Use  . Vaping Use: Never used  Substance Use Topics  . Alcohol use: No  . Drug use: Yes    Types: Marijuana    Comment: rare    Prior to Admission medications   Not on File    Allergies Bee pollen and Shellfish allergy   REVIEW OF SYSTEMS  Negative except as noted here or in the History of Present Illness.   PHYSICAL EXAMINATION  Initial Vital Signs Blood pressure 132/88, pulse 68, temperature 98.1 F (36.7 C), resp. rate 18, height 5\' 5"  (1.651 m), weight 72.6 kg, SpO2 100 %.  Examination General: Well-developed, well-nourished male in no acute distress; appearance consistent with age of record HENT: normocephalic; atraumatic Eyes: Normal appearance Neck: supple Heart: regular rate and rhythm Lungs: clear to auscultation bilaterally Abdomen: soft; nondistended; bowel sounds present Extremities: No deformity; full range of motion Neurologic: Awake, alert and oriented; motor function intact in all extremities and symmetric; no facial droop Skin:  Warm and dry; no rash; no evidence of scratching Psychiatric: Flat affect; paranoid behavior   RESULTS  Summary of this visit's results, reviewed and interpreted by myself:   EKG Interpretation  Date/Time:    Ventricular Rate:    PR Interval:    QRS Duration:   QT Interval:    QTC Calculation:   R Axis:     Text Interpretation:        Laboratory Studies: No results found for this or any previous visit (from the past 24 hour(s)). Imaging Studies: No results found.  ED COURSE and MDM  Nursing notes, initial and subsequent vitals signs, including pulse oximetry, reviewed and interpreted by myself.  Vitals:   07/01/20 0130 07/01/20 0131 07/01/20 0132  BP:   132/88  Pulse:   68  Resp:   18  Temp:   98.1 F (36.7 C)  SpO2: 96%  100%  Weight:  72.6 kg   Height:  5\' 5"  (1.651 m)    Medications - No data to display  It is unclear if the patient actually had an allergic reaction.  He has a normal examination.  His affect is flat and he experiences paranoid behavior, notably a refusal to be touched although he did allow me to listen to her with a stethoscope.  PROCEDURES  Procedures   ED DIAGNOSES     ICD-10-CM   1. Itching  L29.9   2. Paranoid behavior (HCC)  F22  Misa Fedorko, Jonny Ruiz, MD 07/01/20 7915

## 2020-07-01 NOTE — ED Notes (Signed)
Contacted pt's legal guardian regarding picking him up at discharge. Legal guardian says that she does not have gas to come get him.

## 2020-07-01 NOTE — ED Triage Notes (Addendum)
Pt arrives EMS after itching and hives developed around 0100. 50mg  benadryl PO administered by EMS. Denies being around any allergens.

## 2020-07-25 ENCOUNTER — Emergency Department (HOSPITAL_COMMUNITY)
Admission: EM | Admit: 2020-07-25 | Discharge: 2020-07-26 | Disposition: A | Discharge status: Home / Self Care | Payer: Medicaid Other | Attending: Emergency Medicine | Admitting: Emergency Medicine

## 2020-07-25 ENCOUNTER — Encounter (HOSPITAL_COMMUNITY): Payer: Self-pay | Admitting: Emergency Medicine

## 2020-07-25 ENCOUNTER — Other Ambulatory Visit: Payer: Self-pay

## 2020-07-25 DIAGNOSIS — F17210 Nicotine dependence, cigarettes, uncomplicated: Secondary | ICD-10-CM | POA: Diagnosis not present

## 2020-07-25 DIAGNOSIS — F209 Schizophrenia, unspecified: Secondary | ICD-10-CM | POA: Diagnosis not present

## 2020-07-25 DIAGNOSIS — R404 Transient alteration of awareness: Secondary | ICD-10-CM | POA: Insufficient documentation

## 2020-07-25 DIAGNOSIS — Z20822 Contact with and (suspected) exposure to covid-19: Secondary | ICD-10-CM | POA: Insufficient documentation

## 2020-07-25 LAB — COMPREHENSIVE METABOLIC PANEL
ALT: 16 U/L (ref 0–44)
AST: 21 U/L (ref 15–41)
Albumin: 5 g/dL (ref 3.5–5.0)
Alkaline Phosphatase: 38 U/L (ref 38–126)
Anion gap: 9 (ref 5–15)
BUN: 15 mg/dL (ref 6–20)
CO2: 28 mmol/L (ref 22–32)
Calcium: 9.4 mg/dL (ref 8.9–10.3)
Chloride: 100 mmol/L (ref 98–111)
Creatinine, Ser: 1.06 mg/dL (ref 0.61–1.24)
GFR, Estimated: >60 mL/min (ref >60)
Glucose, Bld: 90 mg/dL (ref 70–99)
Potassium: 4 mmol/L (ref 3.5–5.1)
Sodium: 137 mmol/L (ref 135–145)
Total Bilirubin: 1.1 mg/dL (ref 0.3–1.2)
Total Protein: 7.7 g/dL (ref 6.5–8.1)

## 2020-07-25 LAB — CBC
HCT: 43.8 % (ref 39.0–52.0)
Hemoglobin: 15 g/dL (ref 13.0–17.0)
MCH: 31.2 pg (ref 26.0–34.0)
MCHC: 34.2 g/dL (ref 30.0–36.0)
MCV: 91.1 fL (ref 80.0–100.0)
Platelets: 216 10*3/uL (ref 150–400)
RBC: 4.81 MIL/uL (ref 4.22–5.81)
RDW: 13.1 % (ref 11.5–15.5)
WBC: 7.1 10*3/uL (ref 4.0–10.5)
nRBC: 0 % (ref 0.0–0.2)

## 2020-07-25 LAB — ETHANOL: Alcohol, Ethyl (B): <10 mg/dL (ref <10)

## 2020-07-25 NOTE — ED Provider Notes (Signed)
[facility]-EMERGENCY DEPT Provider Note   CSN: [id number] Arrival date & time: 07/25/20  2122     History Chief Complaint  Patient presents with  . Psychiatric Evaluation    [name] is a 29 y.o. male.  HPI   Presents via EMS after being found at the gas station.  Patient reported to EMS, and to me that he needs his mind checked.  He needs to speak with someone.  He seemingly acknowledges visual hallucination, denies auditory. He denies physical pain, complaints, nausea.  It is unclear if he is aware of his psychiatric history, which on chart review is notable for schizophrenia. He denies suicidal or homicidal ideation.  Patient answers all questions and very brief responses when he answers them at all.  Level 5 caveat secondary to psychiatric condition.  Past Medical History:  Diagnosis Date  . Depression   . Schizophrenia [facility])     Patient Active Problem List   Diagnosis Date Noted  . Schizophrenia, paranoid type (HCC) 02/14/2018  . Cannabis use disorder, moderate, dependence (HCC) 05/10/2016    Past Surgical History:  Procedure Laterality Date  . BACK SURGERY         History reviewed. No pertinent family history.  Social History   Tobacco Use  . Smoking status: Current Some Day Smoker    Types: Cigarettes  . Smokeless tobacco: Never Used  Vaping Use  . Vaping Use: Never used  Substance Use Topics  . Alcohol use: No  . Drug use: Yes    Types: Marijuana    Comment: rare    Home Medications Prior to Admission medications   Not on File    Allergies    Bee pollen and Shellfish allergy  Review of Systems   Review of Systems  Unable to perform ROS: Psychiatric disorder    Physical Exam Updated Vital Signs BP (!) 139/92 (BP Location: Left Arm)   Pulse 68   Temp 98 F (36.7 C) (Oral)   Resp 18   SpO2 100%   Physical Exam Vitals and nursing note reviewed.  Constitutional:      General: He is not in acute distress.     Appearance: He is well-developed.  HENT:     Head: Normocephalic and atraumatic.  Eyes:     Conjunctiva/sclera: Conjunctivae normal.  Pulmonary:     Effort: Pulmonary effort is normal. No respiratory distress.     Breath sounds: No stridor.  Abdominal:     General: There is no distension.  Skin:    General: Skin is warm and dry.  Neurological:     Mental Status: He is alert.     Cranial Nerves: No cranial nerve deficit, dysarthria or facial asymmetry.     Motor: No tremor.  Psychiatric:        Attention and Perception: He is inattentive. He perceives visual hallucinations.        Mood and Affect: Affect is flat.        Speech: Speech is delayed.        Behavior: Behavior is withdrawn.     ED Results / Procedures / Treatments   Labs (all labs ordered are listed, but only abnormal results are displayed) Labs Reviewed  COMPREHENSIVE METABOLIC PANEL  ETHANOL  CBC  RAPID URINE DRUG SCREEN, HOSP PERFORMED    EKG None  Radiology No results found.  Procedures Procedures (including critical care time)  ED Course  I have reviewed the triage vital signs and the  nursing notes.  Pertinent labs & imaging results that were available during my care of the patient were reviewed by me and considered in my medical decision making (see chart for details).  Adult male with history of schizophrenia presents with request for evaluation.  Patient has no physical complaints, is in no distress, has no hemodynamic instability.  Patient medically appropriate for behavioral health evaluation. Final Clinical Impression(s) / ED Diagnoses Final diagnoses:  Transient alteration of awareness      Gerhard Munch, MD 07/25/20 2300

## 2020-07-25 NOTE — ED Notes (Signed)
Pt belongings left with 16-18's section nurse

## 2020-07-25 NOTE — ED Notes (Addendum)
Called legal guardian [name] at [phone]. His legal guardian states that he has been asking for mental health help all week and she has been looking for him all day. She also states that his mother just died. She was made aware that he is here and safe. Call her for updates.

## 2020-07-25 NOTE — ED Triage Notes (Signed)
Pt BIB [facility] from the gas station. Hx of schizophrenia. Told EMS that he needed his "mind checked." Denies pain or SOB. Denies ETOH or drug use. Will not offer a specific complaint.

## 2020-07-26 ENCOUNTER — Encounter (HOSPITAL_COMMUNITY): Payer: Self-pay | Admitting: Emergency Medicine

## 2020-07-26 ENCOUNTER — Ambulatory Visit (HOSPITAL_COMMUNITY)
Admission: EM | Admit: 2020-07-26 | Discharge: 2020-07-26 | Disposition: A | Discharge status: Home / Self Care | Payer: Medicaid Other | Attending: Behavioral Health | Admitting: Behavioral Health

## 2020-07-26 ENCOUNTER — Other Ambulatory Visit: Payer: Self-pay

## 2020-07-26 DIAGNOSIS — F17210 Nicotine dependence, cigarettes, uncomplicated: Secondary | ICD-10-CM | POA: Insufficient documentation

## 2020-07-26 DIAGNOSIS — F209 Schizophrenia, unspecified: Secondary | ICD-10-CM | POA: Insufficient documentation

## 2020-07-26 DIAGNOSIS — F200 Paranoid schizophrenia: Secondary | ICD-10-CM

## 2020-07-26 LAB — RAPID URINE DRUG SCREEN, HOSP PERFORMED
Amphetamines: NOT DETECTED
Barbiturates: NOT DETECTED
Benzodiazepines: NOT DETECTED
Cocaine: NOT DETECTED
Opiates: NOT DETECTED
Tetrahydrocannabinol: POSITIVE — AB

## 2020-07-26 LAB — RESPIRATORY PANEL BY RT PCR (FLU A&B, COVID)
Influenza A by PCR: NEGATIVE
Influenza B by PCR: NEGATIVE
SARS Coronavirus 2 by RT PCR: NEGATIVE

## 2020-07-26 MED ORDER — BENZTROPINE MESYLATE 1 MG PO TABS
1.0000 mg | ORAL_TABLET | Freq: Two times a day (BID) | ORAL | Status: DC
  Administered 2020-07-26: 1 mg via ORAL
  Filled 2020-07-26: qty 1

## 2020-07-26 MED ORDER — ACETAMINOPHEN 325 MG PO TABS: 650.0000 mg | ORAL_TABLET | Freq: Four times a day (QID) | ORAL | Status: DC | PRN

## 2020-07-26 MED ORDER — STERILE WATER FOR INJECTION IJ SOLN
INTRAMUSCULAR | Status: AC
  Filled 2020-07-26: qty 10

## 2020-07-26 MED ORDER — MAGNESIUM HYDROXIDE 400 MG/5ML PO SUSP: 30.0000 mL | Freq: Every day | ORAL | Status: DC | PRN

## 2020-07-26 MED ORDER — ALUM & MAG HYDROXIDE-SIMETH 200-200-20 MG/5ML PO SUSP: 30.0000 mL | ORAL | Status: DC | PRN

## 2020-07-26 MED ORDER — PALIPERIDONE ER 6 MG PO TB24
6.0000 mg | ORAL_TABLET | Freq: Every day | ORAL | Status: DC
  Administered 2020-07-26: 6 mg via ORAL
  Filled 2020-07-26: qty 1

## 2020-07-26 MED ORDER — ZIPRASIDONE MESYLATE 20 MG IM SOLR
20.0000 mg | Freq: Once | INTRAMUSCULAR | Status: DC
  Filled 2020-07-26: qty 20

## 2020-07-26 NOTE — Discharge Instructions (Addendum)

## 2020-07-26 NOTE — ED Notes (Signed)
Lunch given.

## 2020-07-26 NOTE — ED Notes (Signed)
Pt sleeping in no acute distress. Environmental checks performed. Safety maintained. 

## 2020-07-26 NOTE — ED Notes (Signed)
Patient belongings placed in locker 28.  °

## 2020-07-26 NOTE — ED Provider Notes (Signed)
[facility]/OBS ASAP Discharge Summary  Date and Time: 07/26/2020 2:16 PM  Name: [name]  MRN:  [id number]   Discharge Diagnoses:  Final diagnoses:  None    Subjective: Patient reports today "this is both yet."  He states that he is ready to go and he is tired of being here.  Patient reports that he has been on medications in the past but he is not interested in continuing them.  Patient states that make him feel like a "zombie."  Patient states that he is interested in possibly going to [facility] or for therapy at the outpatient [facility].  Patient states he would like to have information about these programs.  Patient continues to deny any suicidal homicidal ideations and denies any hallucinations and again states that he is ready to discharge.  Patient reports that he lives with some roommates and friends at a house in [place].  Stay Summary: Patient is a 29 year old male who presented unaccompanied to [place] along the ED and was later transferred to the [facility] after he was picked up outside of a gas station by EMS.  Patient had reported to the EMS that he has schizophrenia and need to have his mind checked.  Patient was admitted to the [facility] continuous assessment unit for overnight observation.  Patient seem to appear to have some thought blocking and difficulty concentrating.  Patient only showed positive for THC.  Patient was restarted on his home medications of Invega 6 mg p.o. daily and Cogentin however, patient had stated that he was not going to take the medications and did not want to continue them.  Patient did agree to follow-up with outpatient services at [facility] at open access.  Patient was provided with the information for open access.  Patient had continued to deny any suicidal homicidal ideations and denied any hallucinations.  Patient was demanding to be discharged so that he can go back home.  Patient does not meet inpatient psychiatric treatment criteria and was discharged.  Total Time spent  with patient: 20 minutes  Past Psychiatric History: schizophrenia, substance abuse, depression Past Medical History:  Past Medical History:  Diagnosis Date  . Depression   . Schizophrenia [facility])     Past Surgical History:  Procedure Laterality Date  . BACK SURGERY     Family History: History reviewed. No pertinent family history. Family Psychiatric History: None reported Social History:  Social History   Substance and Sexual Activity  Alcohol Use No     Social History   Substance and Sexual Activity  Drug Use Yes  . Types: Marijuana   Comment: rare    Social History   Socioeconomic History  . Marital status: Single    Spouse name: Not on file  . Number of children: Not on file  . Years of education: Not on file  . Highest education level: Not on file  Occupational History  . Occupation: unemployed  Tobacco Use  . Smoking status: Current Some Day Smoker    Types: Cigarettes  . Smokeless tobacco: Never Used  Vaping Use  . Vaping Use: Never used  Substance and Sexual Activity  . Alcohol use: No  . Drug use: Yes    Types: Marijuana    Comment: rare  . Sexual activity: Yes    Birth control/protection: Condom  Other Topics Concern  . Not on file  Social History Narrative   UTA certain topics - tangential thought process, cannot answer clearly. Pt not sure about  school; reportedly is homeless.    Social Determinants of Health   Financial Resource Strain:   . Difficulty of Paying Living Expenses: Not on file  Food Insecurity:   . Worried About Programme researcher, broadcasting/film/video in the Last Year: Not on file  . Ran Out of Food in the Last Year: Not on file  Transportation Needs:   . Lack of Transportation (Medical): Not on file  . Lack of Transportation (Non-Medical): Not on file  Physical Activity:   . Days of Exercise per Week: Not on file  . Minutes of Exercise per Session: Not on file  Stress:   . Feeling of Stress : Not on file  Social Connections:   . Frequency of  Communication with Friends and Family: Not on file  . Frequency of Social Gatherings with Friends and Family: Not on file  . Attends Religious Services: Not on file  . Active Member of Clubs or Organizations: Not on file  . Attends Banker Meetings: Not on file  . Marital Status: Not on file   SDOH:  SDOH Screenings   Alcohol Screen: Low Risk   . Last Alcohol Screening Score (AUDIT): 0  Depression (PHQ2-9): Medium Risk  . PHQ-2 Score: 6  Financial Resource Strain:   . Difficulty of Paying Living Expenses: Not on file  Food Insecurity:   . Worried About Programme researcher, broadcasting/film/video in the Last Year: Not on file  . Ran Out of Food in the Last Year: Not on file  Housing:   . Last Housing Risk Score: Not on file  Physical Activity:   . Days of Exercise per Week: Not on file  . Minutes of Exercise per Session: Not on file  Social Connections:   . Frequency of Communication with Friends and Family: Not on file  . Frequency of Social Gatherings with Friends and Family: Not on file  . Attends Religious Services: Not on file  . Active Member of Clubs or Organizations: Not on file  . Attends Banker Meetings: Not on file  . Marital Status: Not on file  Stress:   . Feeling of Stress : Not on file  Tobacco Use: High Risk  . Smoking Tobacco Use: Current Some Day Smoker  . Smokeless Tobacco Use: Never Used  Transportation Needs:   . Freight forwarder (Medical): Not on file  . Lack of Transportation (Non-Medical): Not on file    Has this patient used any form of tobacco in the last 30 days? (Cigarettes, Smokeless Tobacco, Cigars, and/or Pipes) A prescription for an FDA-approved tobacco cessation medication was offered at discharge and the patient refused  Current Medications:  Current Facility-Administered Medications  Medication Dose Route Frequency Provider Last Rate Last Admin  . acetaminophen (TYLENOL) tablet 650 mg  650 mg Oral Q6H PRN Gillermo Murdoch,  NP      . alum & mag hydroxide-simeth (MAALOX/MYLANTA) 200-200-20 MG/5ML suspension 30 mL  30 mL Oral Q4H PRN Gillermo Murdoch, NP      . benztropine (COGENTIN) tablet 1 mg  1 mg Oral BID Marks Scalera, Gerlene Burdock, FNP   1 mg at 07/26/20 0915  . magnesium hydroxide (MILK OF MAGNESIA) suspension 30 mL  30 mL Oral Daily PRN Gillermo Murdoch, NP      . paliperidone (INVEGA) 24 hr tablet 6 mg  6 mg Oral Daily Carlos Quackenbush, Gerlene Burdock, FNP   6 mg at 07/26/20 0915   No current outpatient medications on file.  PTA Medications: (Not in a hospital admission)   Musculoskeletal  Strength & Muscle Tone: within normal limits Gait & Station: normal Patient leans: N/A  Psychiatric Specialty Exam  Presentation  General Appearance: Appropriate for Environment;Casual  Eye Contact:Good  Speech:Clear and Coherent;Normal Rate  Speech Volume:Normal  Handedness:Right   Mood and Affect  Mood:Anxious  Affect:Appropriate;Congruent   Thought Process  Thought Processes:Coherent  Descriptions of Associations:Intact  Orientation:Full (Time, Place and Person)  Thought Content:WDL  Hallucinations:Hallucinations: None  Ideas of Reference:None  Suicidal Thoughts:Suicidal Thoughts: No  Homicidal Thoughts:Homicidal Thoughts: No   Sensorium  Memory:Immediate Good;Recent Good;Remote Good  Judgment:Fair  Insight:Fair   Executive Functions  Concentration:Good  Attention Span:Good  Recall:Good  Fund of Knowledge:Good  Language:Good   Psychomotor Activity  Psychomotor Activity:Psychomotor Activity: Normal   Assets  Assets:Communication Skills;Desire for Improvement;Housing;Physical Health;Social Support;Transportation   Sleep  Sleep:Sleep: Good   Physical Exam  Physical Exam Vitals and nursing note reviewed.  Constitutional:      Appearance: He is well-developed.  HENT:     Head: Normocephalic.  Eyes:     Pupils: Pupils are equal, round, and reactive to light.   Cardiovascular:     Rate and Rhythm: Normal rate.  Pulmonary:     Effort: Pulmonary effort is normal.  Musculoskeletal:        General: Normal range of motion.  Neurological:     Mental Status: He is alert and oriented to person, place, and time.    Review of Systems  Constitutional: Negative.   HENT: Negative.   Eyes: Negative.   Respiratory: Negative.   Cardiovascular: Negative.   Gastrointestinal: Negative.   Genitourinary: Negative.   Musculoskeletal: Negative.   Skin: Negative.   Neurological: Negative.   Endo/Heme/Allergies: Negative.   Psychiatric/Behavioral: Positive for substance abuse.   Blood pressure (!) 134/93, pulse 68, temperature (!) 96.8 F (36 C), temperature source Temporal, resp. rate 18, height 5\' 7"  (1.702 m), weight 165 lb (74.8 kg), SpO2 100 %. Body mass index is 25.84 kg/m.  Demographic Factors:  Male and Low socioeconomic status  Loss Factors: NA  Historical Factors: NA  Risk Reduction Factors:   Living with another person, especially a relative, Positive social support and Positive therapeutic relationship  Continued Clinical Symptoms:  Alcohol/Substance Abuse/Dependencies Previous Psychiatric Diagnoses and Treatments  Cognitive Features That Contribute To Risk:  None    Suicide Risk:  Mild:  Suicidal ideation of limited frequency, intensity, duration, and specificity.  There are no identifiable plans, no associated intent, mild dysphoria and related symptoms, good self-control (both objective and subjective assessment), few other risk factors, and identifiable protective factors, including available and accessible social support.  Plan Of Care/Follow-up recommendations:  Continue activity as tolerated. Continue diet as recommended by your PCP. Ensure to keep all appointments with outpatient providers.  Disposition: Discharge home with safe transport providing transportation  , FNP 07/26/2020, 2:16 PM

## 2020-07-26 NOTE — ED Notes (Signed)
Pt continues to pace in front of bed in hall D. Pt began talking to himself and seemed to become a little irritated. Pt continues to walk back and forth to the restroom.

## 2020-07-26 NOTE — ED Notes (Signed)
Patient A&O x 4, ambulatory. Patient discharged in no acute distress. Patient denied SI/HI, A/VH upon discharge. Patient verbalized understanding of all discharge instructions explained by staff, to include follow up appointments and safety plan. Pt belongings returned to patient from locker #28 intact. Patient escorted to lobby via staff for transport to home. Safety maintained.

## 2020-07-26 NOTE — BH Assessment (Addendum)
Tele Assessment Note   Patient Name: Roverto Bodmer MRN: [id number] Referring Physician: Gerhard Munch, MD Location of Patient: [facility], [facility] Location of Provider: Behavioral Health TTS Department  Jagar Lua is an 29 y.o. single male who presents unaccompanied to [facility] ED after being picked up outside a gar station by EMS. Pt has a diagnosis of schizophrenia and told staff he needed his "mind checked." Pt is a poor historian and often gives contradictory answers that appear to be the result of anxiety or poor concentration. Pt frequently repeats phrases several times trying to express himself, e.g. "I think.. um.. I think.. um.. I think.. um.. I don't know." Pt says he is "scared for his life" and that he is "not thinking right." Pt says he needs medication. When asked if he takes psychiatric medications, Pt says he does not want to take medication. When asked why he was asking for medication if he does not to take medication, Pt says, "I need a chill pill." Pt says he needs something to help him focus. Pt denies current suicidal ideation or history of suicide attempts. He denies current homicidal ideation or history of violence. He denies currently experiencing auditory or visual hallucinations. Pt denies alcohol or other substance use, however Pt has a history of using substances and his urine drug screen is positive for cannabis.   Pt says he is currently homeless. He cannot identify family or friends who are supportive. Pt's medical record indicates Pt has a legal guardian, [name] [phone]. TTS attempted to contact [name] but voicemail gave a different name and therefore no message was left. Pt denies history of abuse. He denies legal problems. He denies access to firearms. Pt says he does not have a psychiatrist or therapist. Pt has been followed by [name] in the past be he says he is not currently receiving [facility] services.  Pt is dressed in hospital scrubs,  alert and oriented x4. Pt speaks in a soft tone, at moderate volume and somewhat stuttering pace. Motor behavior appears restless and Pt was observed by staff pacing and talking to himself. Eye contact is good. Pt's mood is anxious and affect is congruent with mood. Thought process is circumstantial. Insight and judgment are impaired. Pt's concentration is poor. He was cooperative throughout assessment. He says he is willing to be admitted to a psychiatric facility.   Diagnosis: F20.9 Schizophrenia  Past Medical History:  Past Medical History:  Diagnosis Date  . Depression   . Schizophrenia [facility])     Past Surgical History:  Procedure Laterality Date  . BACK SURGERY      Family History: History reviewed. No pertinent family history.  Social History:  reports that he has been smoking cigarettes. He has never used smokeless tobacco. He reports current drug use. Drug: Marijuana. He reports that he does not drink alcohol.  Additional Social History:  Alcohol / Drug Use Pain Medications: See MAR Prescriptions: See MAR Over the Counter: See MAR History of alcohol / drug use?: Yes Longest period of sobriety (when/how long): Unknown Negative Consequences of Use: Personal relationships  CIWA: CIWA-Ar BP: 122/84 Pulse Rate: 80 COWS:    Allergies:  Allergies  Allergen Reactions  . Bee Pollen Itching  . Shellfish Allergy Hives and Itching    seafood    Home Medications: (Not in a hospital admission)   OB/GYN Status:  No LMP for male patient.  General Assessment Data Location of Assessment: [facility] ED TTS Assessment: In system  Is this a Tele or Face-to-Face Assessment?: Tele Assessment Is this an Initial Assessment or a Re-assessment for this encounter?: Initial Assessment Patient Accompanied by:: N/A Language Other than English: No Living Arrangements: Homeless/Shelter What gender do you identify as?: Male Date Telepsych consult ordered in [facility]: 07/25/20 Time Telepsych consult  ordered in [facility]: 2204 Marital status: Single Maiden name: NA Pregnancy Status: No Living Arrangements: Other (Comment) (Homeless) Can pt return to current living arrangement?: Yes Admission Status: Voluntary Is patient capable of signing voluntary admission?: Yes Referral Source: Self/Family/Friend Insurance type: Medicaid     Crisis Care Plan Living Arrangements: Other (Comment) (Homeless) Legal Guardian: Other: [name]) Name of Psychiatrist: None Name of Therapist: None  Education Status Is patient currently in school?: No Is the patient employed, unemployed or receiving disability?: Receiving disability income  Risk to self with the past 6 months Suicidal Ideation: No Has patient been a risk to self within the past 6 months prior to admission? : No Suicidal Intent: No Has patient had any suicidal intent within the past 6 months prior to admission? : No Is patient at risk for suicide?: No Suicidal Plan?: No Has patient had any suicidal plan within the past 6 months prior to admission? : No Access to Means: No What has been your use of drugs/alcohol within the last 12 months?: Pt denies. UDS positive for cannabis. Previous Attempts/Gestures: No How many times?: 0 Other Self Harm Risks: None Triggers for Past Attempts: None known Intentional Self Injurious Behavior: None Family Suicide History: No Recent stressful life event(s): Other (Comment) (Homelessness) Persecutory voices/beliefs?: Yes Depression: No Depression Symptoms: Isolating, Fatigue, Loss of interest in usual pleasures Substance abuse history and/or treatment for substance abuse?: Yes Suicide prevention information given to non-admitted patients: Not applicable  Risk to Others within the past 6 months Homicidal Ideation: No Does patient have any lifetime risk of violence toward others beyond the six months prior to admission? : No Thoughts of Harm to Others: No Current Homicidal Intent: No Current  Homicidal Plan: No Access to Homicidal Means: No Identified Victim: None History of harm to others?: No Assessment of Violence: None Noted Violent Behavior Description: None Does patient have access to weapons?: No Criminal Charges Pending?: No Does patient have a court date: No Is patient on probation?: No  Psychosis Hallucinations: None noted Delusions: Persecutory  Mental Status Report Appearance/Hygiene: In scrubs Eye Contact: Good Motor Activity: Unremarkable, Freedom of movement Speech: Other (Comment) (Stuttering) Level of Consciousness: Alert Mood: Anxious, Helpless Affect: Anxious Anxiety Level: Moderate Thought Processes: Circumstantial Judgement: Impaired Orientation: Person, Place, Situation Obsessive Compulsive Thoughts/Behaviors: None  Cognitive Functioning Concentration: Decreased Memory: Recent Intact, Remote Intact Is patient IDD: No Insight: Poor Impulse Control: Fair Appetite: Fair Have you had any weight changes? : No Change Sleep: Decreased Total Hours of Sleep: 3 Vegetative Symptoms: Decreased grooming  ADLScreening [facility] Assessment Services) Patient's cognitive ability adequate to safely complete daily activities?: Yes Patient able to express need for assistance with ADLs?: Yes Independently performs ADLs?: Yes (appropriate for developmental age)  Prior Inpatient Therapy Prior Inpatient Therapy: Yes Prior Therapy Dates: 02-22-20 & 01-05-20 Prior Therapy Facilty/Provider(s): [facility] Reason for Treatment: schizophrenia  Prior Outpatient Therapy Prior Outpatient Therapy: No Does patient have an ACCT team?: No Does patient have Intensive In-House Services?  : No Does patient have [facility] services? : No Does patient have P4CC services?: No  ADL Screening (condition at time of admission) Patient's cognitive ability adequate to safely complete daily activities?: Yes Is the  patient deaf or have difficulty hearing?: No Does the patient have  difficulty seeing, even when wearing glasses/contacts?: No Does the patient have difficulty concentrating, remembering, or making decisions?: Yes Patient able to express need for assistance with ADLs?: Yes Does the patient have difficulty dressing or bathing?: No Independently performs ADLs?: Yes (appropriate for developmental age) Does the patient have difficulty walking or climbing stairs?: No Weakness of Legs: None Weakness of Arms/Hands: None       Abuse/Neglect Assessment (Assessment to be complete while patient is alone) Abuse/Neglect Assessment Can Be Completed: Yes Physical Abuse: Denies Verbal Abuse: Denies Sexual Abuse: Denies Exploitation of patient/patient's resources: Denies Self-Neglect: Denies     Merchant [employer] officer (For Healthcare) Does Patient Have a Medical Advance Directive?: No (Unable to reach legal guardian) Would patient like information on creating a medical advance directive?: No - Patient declined          Disposition: Gave clinical report to Gillermo Murdoch, NP who recommended Pt be [facility] to [facility] for continuous assessment pending COVID test. Notified Mia McDonald, PA-C and Bertram Millard, RN of recommendation.  Disposition Initial Assessment Completed for this Encounter: Yes  This service was provided via telemedicine using a 2-way, interactive audio and video technology.  Names of all persons participating in this telemedicine service and their role in this encounter. Name: [name] Role: Patient  Name: Shela Commons, [facility] Role: TTS counselor         Harlin Rain Patsy [place], [facility], [facility] Triage Specialist [phone]  Pamalee Leyden 07/26/2020 3:19 AM

## 2020-07-26 NOTE — ED Triage Notes (Signed)
Presents with anxiety and poor concentration, Hx of Schizophrenia.

## 2020-07-26 NOTE — ED Notes (Signed)
Patient discharged.

## 2020-07-26 NOTE — ED Notes (Signed)
Presents with anxiety and requesting meds to help him focus.  Denies SI, HI.  Reports experiencing auditory and visual hallucinations.  Skin search completed, monitoring for safety, no distress noted.

## 2020-07-26 NOTE — ED Provider Notes (Signed)
Behavioral Health Admission H&P [facility])  Date: 07/26/20 Patient Name: [name] MRN: [id number] Chief Complaint:  Chief Complaint  Patient presents with  . Anxiety  . Schizophrenia      Diagnoses:  HPI: [name] is an 29 y.o. single male who presented unaccompanied to [facility] ED and was later transferee to [facility] to continue care. The patient was picked up outside a gas station by EMS. The patient has a diagnosis of schizophrenia and told staff he needed his "mind checked." The patient continues to say he needs his medications to get right. He voiced, "I need some good medicines. I need my mind to work good." The patient is a poor historian and often gives contradictory answers that appear to result from anxiety or poor concentration. Pt frequently repeats phrases several times trying to express himself, e.g., "I think.. um. I think.. um. I think.[name]. I don't know." The patient says he is "scared for his life" and "not thinking right." The patient says he needs medication.   PHQ 2-9:    ED from 05/08/2020 in [facility]  Thoughts that you would be better off dead, or of hurting yourself in some way Not at all  PHQ-9 Total Score 6        [facility]) [facility] Ad[facility] (Discharged) [facility]15/2018 [facility]  C-SSRS RISK CATEGORY Error: Question 2 not populated No Risk No Risk       Total Time spent with patient: 20 minutes  Musculoskeletal  Strength & Muscle Tone: within normal limits Gait & Station: normal Patient leans: N/A  Psychiatric Specialty Exam  Presentation General Appearance: Appropriate for Environment  Eye Contact:Fair  Speech:Clear and Coherent  Speech Volume:Normal  Handedness:Right   Mood and Affect   Mood:Depressed;Irritable  Affect:Congruent   Thought Process  Thought Processes:Coherent  Descriptions of Associations:Intact  Orientation:Full (Time, Place and Person)  Thought Content:WDL  Hallucinations:Hallucinations: None  Ideas of Reference:None  Suicidal Thoughts:Suicidal Thoughts: No  Homicidal Thoughts:Homicidal Thoughts: No   Sensorium  Memory:Immediate Good  Judgment:Fair  Insight:Fair   Executive Functions  Concentration:Fair  Attention Span:Fair  Recall:Fair  Fund of Knowledge:Fair  Language:Fair   Psychomotor Activity  Psychomotor Activity:Psychomotor Activity: Normal   Assets  Assets:Communication Skills;Financial Resources/Insurance;Housing   Sleep  Sleep:Sleep: Good   Physical Exam HENT:     Right Ear: Tympanic membrane normal.     Left Ear: Tympanic membrane normal.  Cardiovascular:     Pulses: Normal pulses.  Musculoskeletal:        General: Normal range of motion.     Cervical back: Normal range of motion and neck supple.  Neurological:     General: No focal deficit present.     Mental Status: He is alert.  Psychiatric:        Attention and Perception: He is inattentive. He perceives auditory hallucinations.        Mood and Affect: Mood is anxious and elated. Affect is blunt.        Speech: Speech is delayed.        Behavior: Behavior is uncooperative and agitated.        Thought Content: Thought content is paranoid and delusional.        Cognition and Memory: Cognition and memory normal.        Judgment: Judgment is impulsive and inappropriate.  Review of Systems  Psychiatric/Behavioral: Positive for depression and substance abuse. The patient is nervous/anxious.   All other systems reviewed and are negative.   Blood pressure (!) 139/99, pulse 70, temperature (!) 97.3 F (36.3 C), temperature source Tympanic, resp. rate 18, height 5\' 7"  (1.702 m), weight 74.8 kg, SpO2 100 %. Body mass index is 25.84  kg/m.  Past Psychiatric History:  Is the patient at risk to self? No  Has the patient been a risk to self in the past 6 months? No .    Has the patient been a risk to self within the distant past? No   Is the patient a risk to others? No   Has the patient been a risk to others in the past 6 months? No   Has the patient been a risk to others within the distant past? No   Past Medical History:  Past Medical History:  Diagnosis Date  . Depression   . Schizophrenia [facility])     Past Surgical History:  Procedure Laterality Date  . BACK SURGERY      Family History: History reviewed. No pertinent family history.  Social History:  Social History   Socioeconomic History  . Marital status: Single    Spouse name: Not on file  . Number of children: Not on file  . Years of education: Not on file  . Highest education level: Not on file  Occupational History  . Occupation: unemployed  Tobacco Use  . Smoking status: Current Some Day Smoker    Types: Cigarettes  . Smokeless tobacco: Never Used  Vaping Use  . Vaping Use: Never used  Substance and Sexual Activity  . Alcohol use: No  . Drug use: Yes    Types: Marijuana    Comment: rare  . Sexual activity: Yes    Birth control/protection: Condom  Other Topics Concern  . Not on file  Social History Narrative   UTA certain topics - tangential thought process, cannot answer clearly. Pt not sure about school; reportedly is homeless.    Social Determinants of Health   Financial Resource Strain:   . Difficulty of Paying Living Expenses: Not on file  Food Insecurity:   . Worried About [facility] in the Last Year: Not on file  . Ran Out of Food in the Last Year: Not on file  Transportation Needs:   . Lack of Transportation (Medical): Not on file  . Lack of Transportation (Non-Medical): Not on file  Physical Activity:   . Days of Exercise per Week: Not on file  . Minutes of Exercise per Session: Not on file  Stress:   .  Feeling of Stress : Not on file  Social Connections:   . Frequency of Communication with Friends and Family: Not on file  . Frequency of Social Gatherings with Friends and Family: Not on file  . Attends Religious Services: Not on file  . Active Member of Clubs or Organizations: Not on file  . Attends Programme researcher, broadcasting/film/video Meetings: Not on file  . Marital Status: Not on file  Intimate Partner Violence:   . Fear of Current or Ex-Partner: Not on file  . Emotionally Abused: Not on file  . Physically Abused: Not on file  . Sexually Abused: Not on file    SDOH:  SDOH Screenings   Alcohol Screen: Low Risk   . Last Alcohol Screening Score (AUDIT): 0  Depression (PHQ2-9): Medium Risk  . PHQ-2 Score: 6  Financial Resource  Strain:   . Difficulty of Paying Living Expenses: Not on file  Food Insecurity:   . Worried About Programme researcher, broadcasting/film/video in the Last Year: Not on file  . Ran Out of Food in the Last Year: Not on file  Housing:   . Last Housing Risk Score: Not on file  Physical Activity:   . Days of Exercise per Week: Not on file  . Minutes of Exercise per Session: Not on file  Social Connections:   . Frequency of Communication with Friends and Family: Not on file  . Frequency of Social Gatherings with Friends and Family: Not on file  . Attends Religious Services: Not on file  . Active Member of Clubs or Organizations: Not on file  . Attends Banker Meetings: Not on file  . Marital Status: Not on file  Stress:   . Feeling of Stress : Not on file  Tobacco Use: High Risk  . Smoking Tobacco Use: Current Some Day Smoker  . Smokeless Tobacco Use: Never Used  Transportation Needs:   . Freight forwarder (Medical): Not on file  . Lack of Transportation (Non-Medical): Not on file    Last Labs:  Admission on 07/25/2020, Discharged on 07/26/2020  Component Date Value Ref Range Status  . Sodium 07/25/2020 137  135 - 145 mmol/L Final  . Potassium 07/25/2020 4.0  3.5 - 5.1  mmol/L Final  . Chloride 07/25/2020 100  98 - 111 mmol/L Final  . CO2 07/25/2020 28  22 - 32 mmol/L Final  . Glucose, Bld 07/25/2020 90  70 - 99 mg/dL Final   Glucose reference range applies only to samples taken after fasting for at least 8 hours.  . BUN 07/25/2020 15  6 - 20 mg/dL Final  . Creatinine, Ser 07/25/2020 1.06  0.61 - 1.24 mg/dL Final  . Calcium 16/07/9603 9.4  8.9 - 10.3 mg/dL Final  . Total Protein 07/25/2020 7.7  6.5 - 8.1 g/dL Final  . Albumin 54/06/8118 5.0  3.5 - 5.0 g/dL Final  . AST 14/78/2956 21  15 - 41 U/L Final  . ALT 07/25/2020 16  0 - 44 U/L Final  . Alkaline Phosphatase 07/25/2020 38  38 - 126 U/L Final  . Total Bilirubin 07/25/2020 1.1  0.3 - 1.2 mg/dL Final  . GFR, Estimated 07/25/2020 >60  >60 mL/min Final  . Anion gap 07/25/2020 9  5 - 15 Final   Performed at [facility], [address] [place], [place] [address]  . Alcohol, Ethyl (B) 07/25/2020 <10  <10 mg/dL Final   Comment: (NOTE) Lowest detectable limit for serum alcohol is 10 mg/dL.  For medical purposes only. Performed at [facility], [address], [place], [place] [address]   . WBC 07/25/2020 7.1  4.0 - 10.5 K/uL Final  . RBC 07/25/2020 4.81  4.22 - 5.81 MIL/uL Final  . Hemoglobin 07/25/2020 15.0  13.0 - 17.0 g/dL Final  . HCT 69/62/9528 43.8  39 - 52 % Final  . MCV 07/25/2020 91.1  80.0 - 100.0 fL Final  . MCH 07/25/2020 31.2  26.0 - 34.0 pg Final  . MCHC 07/25/2020 34.2  30.0 - 36.0 g/dL Final  . RDW 41/32/4401 13.1  11.5 - 15.5 % Final  . Platelets 07/25/2020 216  150 - 400 K/uL Final  . nRBC 07/25/2020 0.0  0.0 - 0.2 % Final   Performed at [facility], [address] [place], [place] [address]  .  Opiates 07/25/2020 NONE DETECTED  NONE DETECTED Final  . Cocaine 07/25/2020 NONE DETECTED  NONE DETECTED Final  . Benzodiazepines 07/25/2020 NONE DETECTED  NONE DETECTED Final  . Amphetamines 07/25/2020 NONE DETECTED  NONE  DETECTED Final  . Tetrahydrocannabinol 07/25/2020 POSITIVE* NONE DETECTED Final  . Barbiturates 07/25/2020 NONE DETECTED  NONE DETECTED Final   Comment: (NOTE) DRUG SCREEN FOR MEDICAL PURPOSES ONLY.  IF CONFIRMATION IS NEEDED FOR ANY PURPOSE, NOTIFY LAB WITHIN 5 DAYS.  LOWEST DETECTABLE LIMITS FOR URINE DRUG SCREEN Drug Class                     Cutoff (ng/mL) Amphetamine and metabolites    1000 Barbiturate and metabolites    200 Benzodiazepine                 200 Tricyclics and metabolites     300 Opiates and metabolites        300 Cocaine and metabolites        300 THC                            50 Performed at [facility], [address], [place], [place] [address]   . SARS Coronavirus 2 by RT PCR 07/25/2020 NEGATIVE  NEGATIVE Final   Comment: (NOTE) SARS-CoV-2 target nucleic acids are NOT DETECTED.  The SARS-CoV-2 RNA is generally detectable in upper respiratoy specimens during the acute phase of infection. The lowest concentration of SARS-CoV-2 viral copies this assay can detect is 131 copies/mL. A negative result does not preclude SARS-Cov-2 infection and should not be used as the sole basis for treatment or other patient management decisions. A negative result may occur with  improper specimen collection/handling, submission of specimen other than nasopharyngeal swab, presence of viral mutation(s) within the areas targeted by this assay, and inadequate number of viral copies (<131 copies/mL). A negative result must be combined with clinical observations, patient history, and epidemiological information. The expected result is Negative.  Fact Sheet for Patients:  https://www.moore.com/  Fact Sheet for Healthcare Providers:  https://www.young.biz/  This test is no                          t yet approved or cleared by the [place] FDA and  has been authorized for detection and/or diagnosis of SARS-CoV-2  by FDA under an Emergency Use Authorization (EUA). This EUA will remain  in effect (meaning this test can be used) for the duration of the COVID-19 declaration under Section 564(b)(1) of the Act, 21 U.S.C. section 360bbb-3(b)(1), unless the authorization is terminated or revoked sooner.    . Influenza A by PCR 07/25/2020 NEGATIVE  NEGATIVE Final  . Influenza B by PCR 07/25/2020 NEGATIVE  NEGATIVE Final   Comment: (NOTE) The Xpert Xpress SARS-CoV-2/FLU/RSV assay is intended as an aid in  the diagnosis of influenza from Nasopharyngeal swab specimens and  should not be used as a sole basis for treatment. Nasal washings and  aspirates are unacceptable for Xpert Xpress SARS-CoV-2/FLU/RSV  testing.  Fact Sheet for Patients: https://www.moore.com/  Fact Sheet for Healthcare Providers: https://www.young.biz/  This test is not yet approved or cleared by the [place] FDA and  has been authorized for detection and/or diagnosis of SARS-CoV-2 by  FDA under an Emergency Use Authorization (EUA). This EUA will remain  in effect (meaning this test can be used) for the duration  of the  Covid-19 declaration under Section 564(b)(1) of the Act, 21  U.S.C. section 360bbb-3(b)(1), unless the authorization is  terminated or revoked. Performed at [facility], [address], [place], [place] [address]   Admission on 06/27/2020, Discharged on 06/27/2020  Component Date Value Ref Range Status  . WBC 06/27/2020 5.6  4.0 - 10.5 K/uL Final  . RBC 06/27/2020 4.38  4.22 - 5.81 MIL/uL Final  . Hemoglobin 06/27/2020 13.7  13.0 - 17.0 g/dL Final  . HCT 78/29/5621 39.4  39 - 52 % Final  . MCV 06/27/2020 90.0  80.0 - 100.0 fL Final  . MCH 06/27/2020 31.3  26.0 - 34.0 pg Final  . MCHC 06/27/2020 34.8  30.0 - 36.0 g/dL Final  . RDW 30/86/5784 12.9  11.5 - 15.5 % Final  . Platelets 06/27/2020 204  150 - 400 K/uL Final  . nRBC 06/27/2020 0.0  0.0 -  0.2 % Final   Performed at [facility], [address] [place], [place] [address]  . Sodium 06/27/2020 138  135 - 145 mmol/L Final  . Potassium 06/27/2020 4.0  3.5 - 5.1 mmol/L Final  . Chloride 06/27/2020 102  98 - 111 mmol/L Final  . CO2 06/27/2020 28  22 - 32 mmol/L Final  . Glucose, Bld 06/27/2020 92  70 - 99 mg/dL Final   Glucose reference range applies only to samples taken after fasting for at least 8 hours.  . BUN 06/27/2020 14  6 - 20 mg/dL Final  . Creatinine, Ser 06/27/2020 1.30* 0.61 - 1.24 mg/dL Final  . Calcium 52/84/1324 8.9  8.9 - 10.3 mg/dL Final  . Total Protein 06/27/2020 6.7  6.5 - 8.1 g/dL Final  . Albumin 40/07/2724 4.2  3.5 - 5.0 g/dL Final  . AST 36/64/4034 17  15 - 41 U/L Final  . ALT 06/27/2020 12  0 - 44 U/L Final  . Alkaline Phosphatase 06/27/2020 32* 38 - 126 U/L Final  . Total Bilirubin 06/27/2020 0.6  0.3 - 1.2 mg/dL Final  . GFR calc non Af Amer 06/27/2020 >60  >60 mL/min Final  . GFR calc Af Amer 06/27/2020 >60  >60 mL/min Final  . Anion gap 06/27/2020 8  5 - 15 Final   Performed at [facility], [address] [place], [place] [address]  . Alcohol, Ethyl (B) 06/27/2020 <10  <10 mg/dL Final   Comment: (NOTE) Lowest detectable limit for serum alcohol is 10 mg/dL.  For medical purposes only. Performed at [facility], [address] [place], [place] [address]   . Salicylate Lvl 06/27/2020 <7.0* 7.0 - 30.0 mg/dL Final   Performed at [facility], [address], [place], [place] [address]  . Acetaminophen (Tylenol), Serum 06/27/2020 <10* 10 - 30 ug/mL Final   Comment: (NOTE) Therapeutic concentrations vary significantly. A range of 10-30 ug/mL  may be an effective concentration for many patients. However, some  are best treated at concentrations outside of this range. Acetaminophen concentrations >150 ug/mL at 4 hours after ingestion  and >50 ug/mL at 12 hours after  ingestion are often associated with  toxic reactions.  Performed at [facility], [address], [place], [place] [address]   . SARS Coronavirus 2 06/27/2020 NEGATIVE  NEGATIVE Final   Comment: (NOTE) SARS-CoV-2 target nucleic acids are NOT DETECTED.  The SARS-CoV-2 RNA is generally detectable in upper and lower respiratory specimens during the acute phase of infection. The  lowest concentration of SARS-CoV-2 viral copies this assay can detect is 250 copies / mL. A negative result does not preclude SARS-CoV-2 infection and should not be used as the sole basis for treatment or other patient management decisions.  A negative result may occur with improper specimen collection / handling, submission of specimen other than nasopharyngeal swab, presence of viral mutation(s) within the areas targeted by this assay, and inadequate number of viral copies (<250 copies / mL). A negative result must be combined with clinical observations, patient history, and epidemiological information.  Fact Sheet for Patients:   BoilerBrush.com.cy  Fact Sheet for Healthcare Providers: https://pope.com/  This test is not yet approved or                           cleared by the [place] FDA and has been authorized for detection and/or diagnosis of SARS-CoV-2 by FDA under an Emergency Use Authorization (EUA).  This EUA will remain in effect (meaning this test can be used) for the duration of the COVID-19 declaration under Section 564(b)(1) of the Act, 21 U.S.C. section 360bbb-3(b)(1), unless the authorization is terminated or revoked sooner.  Performed at [facility], [address], [place], [place] [address]   Admission on 06/17/2020, Discharged on 06/17/2020  Component Date Value Ref Range Status  . Sodium 06/17/2020 139  135 - 145 mmol/L Final  . Potassium 06/17/2020 3.5  3.5 - 5.1 mmol/L Final  . Chloride  06/17/2020 104  98 - 111 mmol/L Final  . CO2 06/17/2020 23  22 - 32 mmol/L Final  . Glucose, Bld 06/17/2020 109* 70 - 99 mg/dL Final   Glucose reference range applies only to samples taken after fasting for at least 8 hours.  . BUN 06/17/2020 10  6 - 20 mg/dL Final  . Creatinine, Ser 06/17/2020 1.22  0.61 - 1.24 mg/dL Final  . Calcium 09/81/1914 9.3  8.9 - 10.3 mg/dL Final  . Total Protein 06/17/2020 6.0* 6.5 - 8.1 g/dL Final  . Albumin 78/29/5621 4.1  3.5 - 5.0 g/dL Final  . AST 30/86/5784 21  15 - 41 U/L Final  . ALT 06/17/2020 13  0 - 44 U/L Final  . Alkaline Phosphatase 06/17/2020 31* 38 - 126 U/L Final  . Total Bilirubin 06/17/2020 0.9  0.3 - 1.2 mg/dL Final  . GFR calc non Af Amer 06/17/2020 >60  >60 mL/min Final  . GFR calc Af Amer 06/17/2020 >60  >60 mL/min Final  . Anion gap 06/17/2020 12  5 - 15 Final   Performed at [facility], [address], [place], [place] [address]  . Alcohol, Ethyl (B) 06/17/2020 <10  <10 mg/dL Final   Comment: (NOTE) Lowest detectable limit for serum alcohol is 10 mg/dL.  For medical purposes only. Performed at [facility], [address], [place], [place] [address]   . Salicylate Lvl 06/17/2020 <7.0* 7.0 - 30.0 mg/dL Final   Performed at [facility], [address], [place], [place] [address]  . Acetaminophen (Tylenol), Serum 06/17/2020 <10* 10 - 30 ug/mL Final   Comment: (NOTE) Therapeutic concentrations vary significantly. A range of 10-30 ug/mL  may be an effective concentration for many patients. However, some  are best treated at concentrations outside of this range. Acetaminophen concentrations >150 ug/mL at 4 hours after ingestion  and >50 ug/mL at 12 hours after ingestion are often associated with  toxic reactions.  Performed  at [facility], [address], [place], [place] [address]   . WBC 06/17/2020 5.6  4.0 - 10.5 K/uL Final  . RBC 06/17/2020 4.58  4.22 - 5.81 MIL/uL Final  . Hemoglobin 06/17/2020  13.7  13.0 - 17.0 g/dL Final  . HCT 60/45/4098 41.5  39 - 52 % Final  . MCV 06/17/2020 90.6  80.0 - 100.0 fL Final  . MCH 06/17/2020 29.9  26.0 - 34.0 pg Final  . MCHC 06/17/2020 33.0  30.0 - 36.0 g/dL Final  . RDW 11/91/4782 13.1  11.5 - 15.5 % Final  . Platelets 06/17/2020 217  150 - 400 K/uL Final  . nRBC 06/17/2020 0.0  0.0 - 0.2 % Final   Performed at [facility], [address], [place], [place] [address]  Admission on 06/11/2020, Discharged on 06/11/2020  Component Date Value Ref Range Status  . Sodium 06/11/2020 140  135 - 145 mmol/L Final  . Potassium 06/11/2020 3.6  3.5 - 5.1 mmol/L Final  . Chloride 06/11/2020 102  98 - 111 mmol/L Final  . CO2 06/11/2020 29  22 - 32 mmol/L Final  . Glucose, Bld 06/11/2020 88  70 - 99 mg/dL Final   Glucose reference range applies only to samples taken after fasting for at least 8 hours.  . BUN 06/11/2020 16  6 - 20 mg/dL Final  . Creatinine, Ser 06/11/2020 1.02  0.61 - 1.24 mg/dL Final  . Calcium 30/86/5784 9.3  8.9 - 10.3 mg/dL Final  . Total Protein 06/11/2020 6.8  6.5 - 8.1 g/dL Final  . Albumin 69/62/9528 4.6  3.5 - 5.0 g/dL Final  . AST 41/32/4401 18  15 - 41 U/L Final  . ALT 06/11/2020 12  0 - 44 U/L Final  . Alkaline Phosphatase 06/11/2020 35* 38 - 126 U/L Final  . Total Bilirubin 06/11/2020 1.3* 0.3 - 1.2 mg/dL Final  . GFR calc non Af Amer 06/11/2020 >60  >60 mL/min Final  . GFR calc Af Amer 06/11/2020 >60  >60 mL/min Final  . Anion gap 06/11/2020 9  5 - 15 Final   Performed at [facility], [address] [place], [place] [address]  . Alcohol, Ethyl (B) 06/11/2020 <10  <10 mg/dL Final   Comment: (NOTE) Lowest detectable limit for serum alcohol is 10 mg/dL.  For medical purposes only. Performed at [facility], [address], [place], [place] [address]   . WBC 06/11/2020 6.3  4.0 - 10.5 K/uL Final  . RBC 06/11/2020 4.80  4.22 - 5.81 MIL/uL Final  . Hemoglobin 06/11/2020 14.8   13.0 - 17.0 g/dL Final  . HCT 03/47/4259 43.1  39 - 52 % Final  . MCV 06/11/2020 89.8  80.0 - 100.0 fL Final  . MCH 06/11/2020 30.8  26.0 - 34.0 pg Final  . MCHC 06/11/2020 34.3  30.0 - 36.0 g/dL Final  . RDW 56/38/7564 12.9  11.5 - 15.5 % Final  . Platelets 06/11/2020 226  150 - 400 K/uL Final  . nRBC 06/11/2020 0.3* 0.0 - 0.2 % Final   Performed at [facility], [address] [place], [place] [address]  Admission on 05/24/2020, Discharged on 05/25/2020  Component Date Value Ref Range Status  . SARS Coronavirus 2 05/24/2020 NEGATIVE  NEGATIVE Final   Comment: (NOTE) SARS-CoV-2 target nucleic acids are NOT DETECTED.  The SARS-CoV-2 RNA is generally detectable in upper and lower respiratory specimens during the acute phase of infection.  The lowest concentration of SARS-CoV-2 viral copies this assay can detect is 250 copies / mL. A negative result does not preclude SARS-CoV-2 infection and should not be used as the sole basis for treatment or other patient management decisions.  A negative result may occur with improper specimen collection / handling, submission of specimen other than nasopharyngeal swab, presence of viral mutation(s) within the areas targeted by this assay, and inadequate number of viral copies (<250 copies / mL). A negative result must be combined with clinical observations, patient history, and epidemiological information.  Fact Sheet for Patients:   BoilerBrush.com.cy  Fact Sheet for Healthcare Providers: https://pope.com/  This test is not yet approved or                           cleared by the [place] FDA and has been authorized for detection and/or diagnosis of SARS-CoV-2 by FDA under an Emergency Use Authorization (EUA).  This EUA will remain in effect (meaning this test can be used) for the duration of the COVID-19 declaration under Section 564(b)(1) of the Act, 21 U.S.C. section  360bbb-3(b)(1), unless the authorization is terminated or revoked sooner.  Performed at [facility], [address] [place], [place] [address]   . WBC 05/24/2020 5.6  4.0 - 10.5 K/uL Final  . RBC 05/24/2020 4.60  4.22 - 5.81 MIL/uL Final  . Hemoglobin 05/24/2020 14.1  13.0 - 17.0 g/dL Final  . HCT 60/45/4098 41.3  39 - 52 % Final  . MCV 05/24/2020 89.8  80.0 - 100.0 fL Final  . MCH 05/24/2020 30.7  26.0 - 34.0 pg Final  . MCHC 05/24/2020 34.1  30.0 - 36.0 g/dL Final  . RDW 11/91/4782 12.5  11.5 - 15.5 % Final  . Platelets 05/24/2020 219  150 - 400 K/uL Final  . nRBC 05/24/2020 0.0  0.0 - 0.2 % Final  . Neutrophils Relative % 05/24/2020 34  % Final  . Neutro Abs 05/24/2020 1.9  1.7 - 7.7 K/uL Final  . Lymphocytes Relative 05/24/2020 55  % Final  . Lymphs Abs 05/24/2020 3.1  0.7 - 4.0 K/uL Final  . Monocytes Relative 05/24/2020 5  % Final  . Monocytes Absolute 05/24/2020 0.3  0.1 - 1.0 K/uL Final  . Eosinophils Relative 05/24/2020 5  % Final  . Eosinophils Absolute 05/24/2020 0.3  0.0 - 0.5 K/uL Final  . Basophils Relative 05/24/2020 1  % Final  . Basophils Absolute 05/24/2020 0.0  0.0 - 0.1 K/uL Final  . Immature Granulocytes 05/24/2020 0  % Final  . Abs Immature Granulocytes 05/24/2020 0.01  0.00 - 0.07 K/uL Final   Performed at [facility], [address], [place], [place] [address]  . Sodium 05/24/2020 140  135 - 145 mmol/L Final  . Potassium 05/24/2020 3.7  3.5 - 5.1 mmol/L Final  . Chloride 05/24/2020 101  98 - 111 mmol/L Final  . CO2 05/24/2020 27  22 - 32 mmol/L Final  . Glucose, Bld 05/24/2020 101* 70 - 99 mg/dL Final   Glucose reference range applies only to samples taken after fasting for at least 8 hours.  . BUN 05/24/2020 14  6 - 20 mg/dL Final  . Creatinine, Ser 05/24/2020 1.29* 0.61 - 1.24 mg/dL Final  . Calcium 30/86/5784 9.6  8.9 - 10.3 mg/dL Final  . Total Protein 05/24/2020 6.4* 6.5 - 8.1 g/dL Final  . Albumin 69/62/9528 4.1  3.5 - 5.0 g/dL  Final   . AST 05/24/2020 18  15 - 41 U/L Final  . ALT 05/24/2020 12  0 - 44 U/L Final  . Alkaline Phosphatase 05/24/2020 37* 38 - 126 U/L Final  . Total Bilirubin 05/24/2020 0.6  0.3 - 1.2 mg/dL Final  . GFR calc non Af Amer 05/24/2020 >60  >60 mL/min Final  . GFR calc Af Amer 05/24/2020 >60  >60 mL/min Final  . Anion gap 05/24/2020 12  5 - 15 Final   Performed at [facility], [address] [place], [place] [address]  . SARS Coronavirus 2 Ag 05/25/2020 NEGATIVE  NEGATIVE Final   Comment: (NOTE) SARS-CoV-2 antigen NOT DETECTED.   Negative results are presumptive.  Negative results do not preclude SARS-CoV-2 infection and should not be used as the sole basis for treatment or other patient management decisions, including infection  control decisions, particularly in the presence of clinical signs and  symptoms consistent with COVID-19, or in those who have been in contact with the virus.  Negative results must be combined with clinical observations, patient history, and epidemiological information. The expected result is Negative.  Fact Sheet for Patients: https://sanders-williams.net/  Fact Sheet for Healthcare Providers: https://martinez.com/   This test is not yet approved or cleared by the [place] FDA and  has been authorized for detection and/or diagnosis of SARS-CoV-2 by FDA under an Emergency Use Authorization (EUA).  This EUA will remain in effect (meaning this test can be used) for the duration of  the C                          OVID-19 declaration under Section 564(b)(1) of the Act, 21 U.S.C. section 360bbb-3(b)(1), unless the authorization is terminated or revoked sooner.    Admission on 05/08/2020, Discharged on 05/08/2020  Component Date Value Ref Range Status  . WBC 05/08/2020 5.3  4.0 - 10.5 K/uL Final  . RBC 05/08/2020 4.78  4.22 - 5.81 MIL/uL Final  . Hemoglobin 05/08/2020 14.8  13.0 - 17.0 g/dL Final  . HCT 84/66/5993  43.5  39 - 52 % Final  . MCV 05/08/2020 91.0  80.0 - 100.0 fL Final  . MCH 05/08/2020 31.0  26.0 - 34.0 pg Final  . MCHC 05/08/2020 34.0  30.0 - 36.0 g/dL Final  . RDW 57/10/7791 12.5  11.5 - 15.5 % Final  . Platelets 05/08/2020 214  150 - 400 K/uL Final  . nRBC 05/08/2020 0.0  0.0 - 0.2 % Final  . Neutrophils Relative % 05/08/2020 27  % Final  . Neutro Abs 05/08/2020 1.5* 1.7 - 7.7 K/uL Final  . Lymphocytes Relative 05/08/2020 60  % Final  . Lymphs Abs 05/08/2020 3.2  0.7 - 4.0 K/uL Final  . Monocytes Relative 05/08/2020 6  % Final  . Monocytes Absolute 05/08/2020 0.3  0.1 - 1.0 K/uL Final  . Eosinophils Relative 05/08/2020 6  % Final  . Eosinophils Absolute 05/08/2020 0.3  0.0 - 0.5 K/uL Final  . Basophils Relative 05/08/2020 1  % Final  . Basophils Absolute 05/08/2020 0.0  0.0 - 0.1 K/uL Final  . Immature Granulocytes 05/08/2020 0  % Final  . Abs Immature Granulocytes 05/08/2020 0.01  0.00 - 0.07 K/uL Final   Performed at [facility], [address] [place], [place] [address]  . Sodium 05/08/2020 139  135 - 145 mmol/L Final  . Potassium 05/08/2020 3.5  3.5 - 5.1 mmol/L Final  .  Chloride 05/08/2020 103  98 - 111 mmol/L Final  . CO2 05/08/2020 26  22 - 32 mmol/L Final  . Glucose, Bld 05/08/2020 97  70 - 99 mg/dL Final   Glucose reference range applies only to samples taken after fasting for at least 8 hours.  . BUN 05/08/2020 13  6 - 20 mg/dL Final  . Creatinine, Ser 05/08/2020 1.33* 0.61 - 1.24 mg/dL Final  . Calcium 54/00/8676 9.3  8.9 - 10.3 mg/dL Final  . Total Protein 05/08/2020 6.6  6.5 - 8.1 g/dL Final  . Albumin 19/50/9326 4.2  3.5 - 5.0 g/dL Final  . AST 71/24/5809 22  15 - 41 U/L Final  . ALT 05/08/2020 14  0 - 44 U/L Final  . Alkaline Phosphatase 05/08/2020 34* 38 - 126 U/L Final  . Total Bilirubin 05/08/2020 1.0  0.3 - 1.2 mg/dL Final  . GFR calc non Af Amer 05/08/2020 >60  >60 mL/min Final  . GFR calc Af Amer 05/08/2020 >60  >60 mL/min Final  . Anion gap  05/08/2020 10  5 - 15 Final   Performed at [facility], [address] [place], [place] [address]  . POC Amphetamine UR 05/08/2020 None Detected  None Detected Preliminary  . POC Secobarbital (BAR) 05/08/2020 None Detected  None Detected Preliminary  . POC Buprenorphine (BUP) 05/08/2020 None Detected  None Detected Preliminary  . POC Oxazepam (BZO) 05/08/2020 None Detected  None Detected Preliminary  . POC Cocaine UR 05/08/2020 None Detected  None Detected Preliminary  . POC Methamphetamine UR 05/08/2020 None Detected  None Detected Preliminary  . POC Morphine 05/08/2020 None Detected  None Detected Preliminary  . POC Oxycodone UR 05/08/2020 None Detected  None Detected Preliminary  . POC Methadone UR 05/08/2020 None Detected  None Detected Preliminary  . POC Marijuana UR 05/08/2020 Positive* None Detected Preliminary  . SARS Coronavirus 2 Ag 05/08/2020 NEGATIVE  NEGATIVE Final   Comment: (NOTE) SARS-CoV-2 antigen NOT DETECTED.   Negative results are presumptive.  Negative results do not preclude SARS-CoV-2 infection and should not be used as the sole basis for treatment or other patient management decisions, including infection  control decisions, particularly in the presence of clinical signs and  symptoms consistent with COVID-19, or in those who have been in contact with the virus.  Negative results must be combined with clinical observations, patient history, and epidemiological information. The expected result is Negative.  Fact Sheet for Patients: https://sanders-williams.net/  Fact Sheet for Healthcare Providers: https://martinez.com/   This test is not yet approved or cleared by the [place] FDA and  has been authorized for detection and/or diagnosis of SARS-CoV-2 by FDA under an Emergency Use Authorization (EUA).  This EUA will remain in effect (meaning this test can be used) for the duration of  the C                           OVID-19 declaration under Section 564(b)(1) of the Act, 21 U.S.C. section 360bbb-3(b)(1), unless the authorization is terminated or revoked sooner.    Admission on 02/19/2020, Discharged on 02/22/2020  Component Date Value Ref Range Status  . SARS Coronavirus 2 02/20/2020 NEGATIVE  NEGATIVE Final   Comment: (NOTE) SARS-CoV-2 target nucleic acids are NOT DETECTED. The SARS-CoV-2 RNA is generally detectable in upper and lower respiratory specimens during the acute phase of infection. The lowest concentration of SARS-CoV-2 viral copies this assay can detect is 250 copies / mL.  A negative result does not preclude SARS-CoV-2 infection and should not be used as the sole basis for treatment or other patient management decisions.  A negative result may occur with improper specimen collection / handling, submission of specimen other than nasopharyngeal swab, presence of viral mutation(s) within the areas targeted by this assay, and inadequate number of viral copies (<250 copies / mL). A negative result must be combined with clinical observations, patient history, and epidemiological information. Fact Sheet for Patients:   BoilerBrush.com.cy Fact Sheet for Healthcare Providers: https://pope.com/ This test is not yet approved or cleared                           by the [place] FDA and has been authorized for detection and/or diagnosis of SARS-CoV-2 by FDA under an Emergency Use Authorization (EUA).  This EUA will remain in effect (meaning this test can be used) for the duration of the COVID-19 declaration under Section 564(b)(1) of the Act, 21 U.S.C. section 360bbb-3(b)(1), unless the authorization is terminated or revoked sooner. Performed at [facility], [address], [place], [place] [address]   . Acetaminophen (Tylenol), Serum 02/19/2020 <10* 10 - 30 ug/mL Final   Comment: (NOTE) Therapeutic concentrations vary  significantly. A range of 10-30 ug/mL  may be an effective concentration for many patients. However, some  are best treated at concentrations outside of this range. Acetaminophen concentrations >150 ug/mL at 4 hours after ingestion  and >50 ug/mL at 12 hours after ingestion are often associated with  toxic reactions. Performed at [facility], [address], [place], [place] [address]   . Sodium 02/19/2020 139  135 - 145 mmol/L Final  . Potassium 02/19/2020 4.1  3.5 - 5.1 mmol/L Final  . Chloride 02/19/2020 103  98 - 111 mmol/L Final  . CO2 02/19/2020 28  22 - 32 mmol/L Final  . Glucose, Bld 02/19/2020 84  70 - 99 mg/dL Final   Glucose reference range applies only to samples taken after fasting for at least 8 hours.  . BUN 02/19/2020 15  6 - 20 mg/dL Final  . Creatinine, Ser 02/19/2020 1.04  0.61 - 1.24 mg/dL Final  . Calcium 09/81/1914 9.2  8.9 - 10.3 mg/dL Final  . Total Protein 02/19/2020 6.4* 6.5 - 8.1 g/dL Final  . Albumin 78/29/5621 4.0  3.5 - 5.0 g/dL Final  . AST 30/86/5784 18  15 - 41 U/L Final  . ALT 02/19/2020 13  0 - 44 U/L Final  . Alkaline Phosphatase 02/19/2020 36* 38 - 126 U/L Final  . Total Bilirubin 02/19/2020 0.8  0.3 - 1.2 mg/dL Final  . GFR calc non Af Amer 02/19/2020 >60  >60 mL/min Final  . GFR calc Af Amer 02/19/2020 >60  >60 mL/min Final  . Anion gap 02/19/2020 8  5 - 15 Final   Performed at [facility] [address], [place], [place] [address]  . Alcohol, Ethyl (B) 02/19/2020 <10  <10 mg/dL Final   Comment: (NOTE) Lowest detectable limit for serum alcohol is 10 mg/dL. For medical purposes only. Performed at [facility], [address] [place], [place] [address]   . WBC 02/19/2020 5.5  4.0 - 10.5 K/uL Final  . RBC 02/19/2020 4.45  4.22 - 5.81 MIL/uL Final  . Hemoglobin 02/19/2020 13.9  13.0 - 17.0 g/dL Final  . HCT 32/44/0102 40.5  39 - 52 % Final  . MCV  02/19/2020 91.0  80.0 - 100.0 fL  Final  . MCH 02/19/2020 31.2  26.0 - 34.0 pg Final  . MCHC 02/19/2020 34.3  30.0 - 36.0 g/dL Final  . RDW 53/66/4403 13.0  11.5 - 15.5 % Final  . Platelets 02/19/2020 204  150 - 400 K/uL Final  . nRBC 02/19/2020 0.0  0.0 - 0.2 % Final  . Neutrophils Relative % 02/19/2020 29  % Final  . Neutro Abs 02/19/2020 1.6* 1.7 - 7.7 K/uL Final  . Lymphocytes Relative 02/19/2020 54  % Final  . Lymphs Abs 02/19/2020 3.0  0.7 - 4.0 K/uL Final  . Monocytes Relative 02/19/2020 7  % Final  . Monocytes Absolute 02/19/2020 0.4  0.1 - 1.0 K/uL Final  . Eosinophils Relative 02/19/2020 9  % Final  . Eosinophils Absolute 02/19/2020 0.5  0.0 - 0.5 K/uL Final  . Basophils Relative 02/19/2020 1  % Final  . Basophils Absolute 02/19/2020 0.0  0.0 - 0.1 K/uL Final  . Immature Granulocytes 02/19/2020 0  % Final  . Abs Immature Granulocytes 02/19/2020 0.01  0.00 - 0.07 K/uL Final   Performed at [facility], [address] [place], [place] [address]  . Color, Urine 02/20/2020 YELLOW  YELLOW Final  . APPearance 02/20/2020 CLEAR  CLEAR Final  . Specific Gravity, Urine 02/20/2020 1.023  1.005 - 1.030 Final  . pH 02/20/2020 6.0  5.0 - 8.0 Final  . Glucose, UA 02/20/2020 NEGATIVE  NEGATIVE mg/dL Final  . Hgb urine dipstick 02/20/2020 NEGATIVE  NEGATIVE Final  . Bilirubin Urine 02/20/2020 NEGATIVE  NEGATIVE Final  . Ketones, ur 02/20/2020 NEGATIVE  NEGATIVE mg/dL Final  . Protein, ur 95/63/8756 NEGATIVE  NEGATIVE mg/dL Final  . Nitrite 43/32/9518 NEGATIVE  NEGATIVE Final  . Glori Luis 02/20/2020 NEGATIVE  NEGATIVE Final   Performed at [facility], [address] [place], [place] [address]  . Opiates 02/20/2020 NONE DETECTED  NONE DETECTED Final  . Cocaine 02/20/2020 NONE DETECTED  NONE DETECTED Final  . Benzodiazepines 02/20/2020 NONE DETECTED  NONE DETECTED Final  . Amphetamines 02/20/2020 NONE DETECTED  NONE DETECTED Final  . Tetrahydrocannabinol 02/20/2020 POSITIVE* NONE  DETECTED Final  . Barbiturates 02/20/2020 NONE DETECTED  NONE DETECTED Final   Comment: (NOTE) DRUG SCREEN FOR MEDICAL PURPOSES ONLY.  IF CONFIRMATION IS NEEDED FOR ANY PURPOSE, NOTIFY LAB WITHIN 5 DAYS. LOWEST DETECTABLE LIMITS FOR URINE DRUG SCREEN Drug Class                     Cutoff (ng/mL) Amphetamine and metabolites    1000 Barbiturate and metabolites    200 Benzodiazepine                 200 Tricyclics and metabolites     300 Opiates and metabolites        300 Cocaine and metabolites        300 THC                            50 Performed at [facility], [address], [place], [place] [address]     Allergies: Bee pollen and Shellfish allergy  PTA Medications: (Not in a hospital admission)   Medical Decision Making  The patient will remain under observation overnight and reassess in the A.M. to determine if he meets the criteria for psychiatric inpatient admission or can be discharged. Recommendations  Based on my evaluation the  patient does not appear to have an emergency medical condition.  Gillermo MurdochJacqueline Shameek Nyquist, NP 07/26/20  7:24 AM

## 2020-07-26 NOTE — ED Notes (Addendum)
Pt received medication with difficulty. Pt states, "the medication is no good. It's plain medicine. I just need something to make me chill. This medicine makes me feel funny. I don't need this medicine. I just hope that it don't make me angry and flip on somebody". Encouraged compliance with recommendations or talk with NP concerning other tx options. Verbalized agreement. Pt denies SI/HI. Denies A/VH. Paranoid about taking medication. Pt states that the medication does something to his body. Reassurance attempted but pt remains fixated on medication and stated, "this is going to be the last time I take any medicines". Pt walked away mumbling, agitated about taking medication. Safety maintained.

## 2020-08-05 ENCOUNTER — Other Ambulatory Visit: Payer: Self-pay

## 2020-08-05 ENCOUNTER — Encounter (HOSPITAL_COMMUNITY): Payer: Self-pay

## 2020-08-05 DIAGNOSIS — F209 Schizophrenia, unspecified: Secondary | ICD-10-CM | POA: Diagnosis not present

## 2020-08-05 DIAGNOSIS — Z20822 Contact with and (suspected) exposure to covid-19: Secondary | ICD-10-CM | POA: Diagnosis not present

## 2020-08-05 DIAGNOSIS — F17210 Nicotine dependence, cigarettes, uncomplicated: Secondary | ICD-10-CM | POA: Insufficient documentation

## 2020-08-05 DIAGNOSIS — Z9114 Patient's other noncompliance with medication regimen: Secondary | ICD-10-CM | POA: Insufficient documentation

## 2020-08-05 LAB — CBC
HCT: 41.9 % (ref 39.0–52.0)
Hemoglobin: 14.3 g/dL (ref 13.0–17.0)
MCH: 31.6 pg (ref 26.0–34.0)
MCHC: 34.1 g/dL (ref 30.0–36.0)
MCV: 92.7 fL (ref 80.0–100.0)
Platelets: 251 10*3/uL (ref 150–400)
RBC: 4.52 MIL/uL (ref 4.22–5.81)
RDW: 13 % (ref 11.5–15.5)
WBC: 5.3 10*3/uL (ref 4.0–10.5)
nRBC: 0 % (ref 0.0–0.2)

## 2020-08-05 NOTE — ED Triage Notes (Addendum)
Pt sts he needs medication for his mind. Legal guardian called and informs triage that pt has refused to take medications when he is not at the hospital. Requests getting him to [facility] for medication adjustment. Hx schizophrenia. Clinched fist in triage and not answering questions.

## 2020-08-06 ENCOUNTER — Encounter (HOSPITAL_COMMUNITY): Payer: Self-pay

## 2020-08-06 ENCOUNTER — Ambulatory Visit (HOSPITAL_COMMUNITY)
Admission: EM | Admit: 2020-08-06 | Discharge: 2020-08-06 | Disposition: A | Discharge status: Home / Self Care | Payer: Medicaid Other | Attending: Psychiatry | Admitting: Psychiatry

## 2020-08-06 ENCOUNTER — Other Ambulatory Visit: Payer: Self-pay

## 2020-08-06 ENCOUNTER — Emergency Department (HOSPITAL_COMMUNITY)
Admission: EM | Admit: 2020-08-06 | Discharge: 2020-08-06 | Disposition: A | Discharge status: Other Acute Inpatient Hospital | Payer: Medicaid Other | Attending: Emergency Medicine | Admitting: Emergency Medicine

## 2020-08-06 DIAGNOSIS — Z20822 Contact with and (suspected) exposure to covid-19: Secondary | ICD-10-CM | POA: Insufficient documentation

## 2020-08-06 DIAGNOSIS — F209 Schizophrenia, unspecified: Secondary | ICD-10-CM

## 2020-08-06 DIAGNOSIS — F17210 Nicotine dependence, cigarettes, uncomplicated: Secondary | ICD-10-CM | POA: Insufficient documentation

## 2020-08-06 DIAGNOSIS — Z9114 Patient's other noncompliance with medication regimen: Secondary | ICD-10-CM

## 2020-08-06 LAB — COMPREHENSIVE METABOLIC PANEL
ALT: 14 U/L (ref 0–44)
AST: 17 U/L (ref 15–41)
Albumin: 4.4 g/dL (ref 3.5–5.0)
Alkaline Phosphatase: 41 U/L (ref 38–126)
Anion gap: 9 (ref 5–15)
BUN: 14 mg/dL (ref 6–20)
CO2: 28 mmol/L (ref 22–32)
Calcium: 9.4 mg/dL (ref 8.9–10.3)
Chloride: 103 mmol/L (ref 98–111)
Creatinine, Ser: 1.24 mg/dL (ref 0.61–1.24)
GFR, Estimated: >60 mL/min (ref >60)
Glucose, Bld: 113 mg/dL — ABNORMAL HIGH (ref 70–99)
Potassium: 4.3 mmol/L (ref 3.5–5.1)
Sodium: 140 mmol/L (ref 135–145)
Total Bilirubin: 0.7 mg/dL (ref 0.3–1.2)
Total Protein: 7.3 g/dL (ref 6.5–8.1)

## 2020-08-06 LAB — RESPIRATORY PANEL BY RT PCR (FLU A&B, COVID)
Influenza A by PCR: NEGATIVE
Influenza B by PCR: NEGATIVE
SARS Coronavirus 2 by RT PCR: NEGATIVE

## 2020-08-06 LAB — SALICYLATE LEVEL: Salicylate Lvl: <7 mg/dL — ABNORMAL LOW (ref 7.0–30.0)

## 2020-08-06 LAB — RAPID URINE DRUG SCREEN, HOSP PERFORMED
Amphetamines: NOT DETECTED
Barbiturates: NOT DETECTED
Benzodiazepines: NOT DETECTED
Cocaine: NOT DETECTED
Opiates: NOT DETECTED
Tetrahydrocannabinol: POSITIVE — AB

## 2020-08-06 LAB — ACETAMINOPHEN LEVEL: Acetaminophen (Tylenol), Serum: <10 ug/mL — ABNORMAL LOW (ref 10–30)

## 2020-08-06 LAB — ETHANOL: Alcohol, Ethyl (B): <10 mg/dL (ref <10)

## 2020-08-06 MED ORDER — ALUM & MAG HYDROXIDE-SIMETH 200-200-20 MG/5ML PO SUSP: 30.0000 mL | ORAL | Status: DC | PRN

## 2020-08-06 MED ORDER — BENZTROPINE MESYLATE 1 MG PO TABS
1.0000 mg | ORAL_TABLET | Freq: Two times a day (BID) | ORAL | Status: DC
  Filled 2020-08-06: qty 1

## 2020-08-06 MED ORDER — PALIPERIDONE ER 6 MG PO TB24
6.0000 mg | ORAL_TABLET | Freq: Every day | ORAL | Status: DC
  Filled 2020-08-06: qty 1

## 2020-08-06 MED ORDER — ACETAMINOPHEN 325 MG PO TABS: 650.0000 mg | ORAL_TABLET | Freq: Four times a day (QID) | ORAL | Status: DC | PRN

## 2020-08-06 MED ORDER — MAGNESIUM HYDROXIDE 400 MG/5ML PO SUSP: 30.0000 mL | Freq: Every day | ORAL | Status: DC | PRN

## 2020-08-06 NOTE — ED Provider Notes (Signed)
Behavioral Health Admission H&P [facility] & OBS)  Date: 08/06/20 Patient Name: [name] MRN: [id number] Chief Complaint:  Chief Complaint  Patient presents with  . Paranoid      Diagnoses:  Final diagnoses:  Schizophrenia, unspecified type (HCC)    HPI:  [name] is a 29 y.o. male with a history of schizophrenia who presented to [facility] voluntarily requesting assistance for his mental health. Patient denied SI/HI/AVH, but when TTS asked if he could contract for safety, the patient stated that he could not contract for safety. He was not able to elaborate on why could not contract for safety. He was transferred to [facility] for continuous assessment.  On evaluation, patient is alert and oriented x 3. He is pleasant and cooperative. Speech is clear. He appears to have some thought blocking at times. He reports feeling depressed at times. He states "I need medicine for my mind." He has previously declined medications. Per ED notes, his guardian reported that the patient has not been taking medications. He denies suicidal ideations. He denies homicidal ideations. When asked about A,  he states "I don't know man, I don't know what's going on, I just get lost in my mind some times." He appears to be internally preoccupied at times. Reports that he occasionally uses mairjuana. Denies use of other substances. UDS positive for THC, negative for other substances.   PHQ 2-9:    ED from 05/08/2020 in [facility]  Thoughts that you would be better off dead, or of hurting yourself in some way Not at all  PHQ-9 Total Score 6        ED from 08/06/2020 in [facility] Most recent reading at 08/06/2020  6:24 AM ED from 08/06/2020 in [facility] Most recent reading at 08/05/2020 11:11 PM [facility] (Dis[facility]) [facility] Most recent reading at 02/22/2020  3:01 PM   C-SSRS RISK CATEGORY No Risk No Risk Error: Question 2 not populated       Total Time spent with patient: 20 minutes  Musculoskeletal  Strength & Muscle Tone: within normal limits Gait & Station: normal Patient leans: N/A  Psychiatric Specialty Exam  Presentation General Appearance: Fairly Groomed  Eye Contact:Fair  Speech:Clear and Coherent  Speech Volume:Normal  Handedness:Right   Mood and Affect  Mood:Anxious;Depressed  Affect:Appropriate;Blunt   Thought Process  Thought Processes:Coherent  Descriptions of Associations:Intact  Orientation:Full (Time, Place and Person)  Thought Content:Logical  Hallucinations:Hallucinations: Other (comment) (when asked about AH he states"I don't know man, I don;t know whats going on, I just get lost in my mind sometimes.")  Ideas of Reference:None  Suicidal Thoughts:Suicidal Thoughts: No  Homicidal Thoughts:Homicidal Thoughts: No   Sensorium  Memory:Immediate Fair;Recent Fair;Remote Fair  Judgment:Intact  Insight:Lacking   Executive Functions  Concentration:Fair  Attention Span:Fair  Recall:Fair  Fund of Knowledge:Fair  Language:Good   Psychomotor Activity  Psychomotor Activity:Psychomotor Activity: Normal   Assets  Assets:Communication Skills;Desire for Improvement;Resilience;Social Support;Physical Health   Sleep  Sleep:Sleep: Fair   Physical Exam Constitutional:      General: He is not in acute distress.    Appearance: He is not ill-appearing, toxic-appearing or diaphoretic.  HENT:     Head: Normocephalic.     Right Ear: External ear normal.     Left Ear: External ear normal.  Eyes:     Pupils: Pupils are equal, round, and reactive to light.  Cardiovascular:     Rate  and Rhythm: Normal rate.  Pulmonary:     Effort: Pulmonary effort is normal. No respiratory distress.  Musculoskeletal:        General: Normal range of motion.  Skin:    General: Skin is warm and dry.  Neurological:      Mental Status: He is alert and oriented to person, place, and time.  Psychiatric:        Mood and Affect: Mood is anxious and depressed.        Speech: Speech normal.        Behavior: Behavior is cooperative.        Thought Content: Thought content does not include homicidal or suicidal ideation. Thought content does not include suicidal plan.    Review of Systems  Constitutional: Negative for chills, diaphoresis, fever, malaise/fatigue and weight loss.  HENT: Negative for congestion.   Respiratory: Negative for cough and shortness of breath.   Cardiovascular: Negative for chest pain and palpitations.  Gastrointestinal: Negative for diarrhea, nausea and vomiting.  Neurological: Negative for dizziness and seizures.  Psychiatric/Behavioral: Positive for depression and suicidal ideas. Negative for hallucinations, memory loss and substance abuse. The patient is nervous/anxious and has insomnia.   All other systems reviewed and are negative.   Blood pressure 109/75, pulse 60, temperature 98.5 F (36.9 C), temperature source Oral, resp. rate 18, SpO2 99 %. There is no height or weight on file to calculate BMI.  Past Psychiatric History: Schizophrenia   Is the patient at risk to self? No  Has the patient been a risk to self in the past 6 months? No .    Has the patient been a risk to self within the distant past? Yes   Is the patient a risk to others? No   Has the patient been a risk to others in the past 6 months? No   Has the patient been a risk to others within the distant past? No   Past Medical History:  Past Medical History:  Diagnosis Date  . Depression   . Schizophrenia [facility])     Past Surgical History:  Procedure Laterality Date  . BACK SURGERY      Family History: History reviewed. No pertinent family history.  Social History:  Social History   Socioeconomic History  . Marital status: Single    Spouse name: Not on file  . Number of children: Not on file  .  Years of education: Not on file  . Highest education level: Not on file  Occupational History  . Occupation: unemployed  Tobacco Use  . Smoking status: Current Some Day Smoker    Types: Cigarettes  . Smokeless tobacco: Never Used  Vaping Use  . Vaping Use: Never used  Substance and Sexual Activity  . Alcohol use: No  . Drug use: Yes    Types: Marijuana    Comment: rare  . Sexual activity: Yes    Birth control/protection: Condom  Other Topics Concern  . Not on file  Social History Narrative   UTA certain topics - tangential thought process, cannot answer clearly. Pt not sure about school; reportedly is homeless.    Social Determinants of Health   Financial Resource Strain:   . Difficulty of Paying Living Expenses: Not on file  Food Insecurity:   . Worried About Programme researcher, broadcasting/film/video in the Last Year: Not on file  . Ran Out of Food in the Last Year: Not on file  Transportation Needs:   . Lack of Transportation (  Medical): Not on file  . Lack of Transportation (Non-Medical): Not on file  Physical Activity:   . Days of Exercise per Week: Not on file  . Minutes of Exercise per Session: Not on file  Stress:   . Feeling of Stress : Not on file  Social Connections:   . Frequency of Communication with Friends and Family: Not on file  . Frequency of Social Gatherings with Friends and Family: Not on file  . Attends Religious Services: Not on file  . Active Member of Clubs or Organizations: Not on file  . Attends Banker Meetings: Not on file  . Marital Status: Not on file  Intimate Partner Violence:   . Fear of Current or Ex-Partner: Not on file  . Emotionally Abused: Not on file  . Physically Abused: Not on file  . Sexually Abused: Not on file    SDOH:  SDOH Screenings   Alcohol Screen: Low Risk   . Last Alcohol Screening Score (AUDIT): 0  Depression (PHQ2-9): Medium Risk  . PHQ-2 Score: 6  Financial Resource Strain:   . Difficulty of Paying Living Expenses:  Not on file  Food Insecurity:   . Worried About Programme researcher, broadcasting/film/video in the Last Year: Not on file  . Ran Out of Food in the Last Year: Not on file  Housing:   . Last Housing Risk Score: Not on file  Physical Activity:   . Days of Exercise per Week: Not on file  . Minutes of Exercise per Session: Not on file  Social Connections:   . Frequency of Communication with Friends and Family: Not on file  . Frequency of Social Gatherings with Friends and Family: Not on file  . Attends Religious Services: Not on file  . Active Member of Clubs or Organizations: Not on file  . Attends Banker Meetings: Not on file  . Marital Status: Not on file  Stress:   . Feeling of Stress : Not on file  Tobacco Use: High Risk  . Smoking Tobacco Use: Current Some Day Smoker  . Smokeless Tobacco Use: Never Used  Transportation Needs:   . Freight forwarder (Medical): Not on file  . Lack of Transportation (Non-Medical): Not on file    Last Labs:  Admission on 08/06/2020, Discharged on 08/06/2020  Component Date Value Ref Range Status  . Sodium 08/05/2020 140  135 - 145 mmol/L Final  . Potassium 08/05/2020 4.3  3.5 - 5.1 mmol/L Final  . Chloride 08/05/2020 103  98 - 111 mmol/L Final  . CO2 08/05/2020 28  22 - 32 mmol/L Final  . Glucose, Bld 08/05/2020 113* 70 - 99 mg/dL Final   Glucose reference range applies only to samples taken after fasting for at least 8 hours.  . BUN 08/05/2020 14  6 - 20 mg/dL Final  . Creatinine, Ser 08/05/2020 1.24  0.61 - 1.24 mg/dL Final  . Calcium 96/01/5408 9.4  8.9 - 10.3 mg/dL Final  . Total Protein 08/05/2020 7.3  6.5 - 8.1 g/dL Final  . Albumin 81/19/1478 4.4  3.5 - 5.0 g/dL Final  . AST 29/56/2130 17  15 - 41 U/L Final  . ALT 08/05/2020 14  0 - 44 U/L Final  . Alkaline Phosphatase 08/05/2020 41  38 - 126 U/L Final  . Total Bilirubin 08/05/2020 0.7  0.3 - 1.2 mg/dL Final  . GFR, Estimated 08/05/2020 >60  >60 mL/min Final   Comment: (NOTE) Calculated  using the CKD-EPI Creatinine  Equation (2021)   . Anion gap 08/05/2020 9  5 - 15 Final   Performed at [facility], [address] [place], [place] [address]  . Alcohol, Ethyl (B) 08/05/2020 <10  <10 mg/dL Final   Comment: (NOTE) Lowest detectable limit for serum alcohol is 10 mg/dL.  For medical purposes only. Performed at [facility], [address] [place], [place] [address]   . Salicylate Lvl 08/05/2020 <7.0* 7.0 - 30.0 mg/dL Final   Performed at [facility], [address] [place], [place] [address]  . Acetaminophen (Tylenol), Serum 08/05/2020 <10* 10 - 30 ug/mL Final   Comment: (NOTE) Therapeutic concentrations vary significantly. A range of 10-30 ug/mL  may be an effective concentration for many patients. However, some  are best treated at concentrations outside of this range. Acetaminophen concentrations >150 ug/mL at 4 hours after ingestion  and >50 ug/mL at 12 hours after ingestion are often associated with  toxic reactions.  Performed at [facility], [address], [place], [place] [address]   . WBC 08/05/2020 5.3  4.0 - 10.5 K/uL Final  . RBC 08/05/2020 4.52  4.22 - 5.81 MIL/uL Final  . Hemoglobin 08/05/2020 14.3  13.0 - 17.0 g/dL Final  . HCT 87/86/7672 41.9  39 - 52 % Final  . MCV 08/05/2020 92.7  80.0 - 100.0 fL Final  . MCH 08/05/2020 31.6  26.0 - 34.0 pg Final  . MCHC 08/05/2020 34.1  30.0 - 36.0 g/dL Final  . RDW 09/47/0962 13.0  11.5 - 15.5 % Final  . Platelets 08/05/2020 251  150 - 400 K/uL Final  . nRBC 08/05/2020 0.0  0.0 - 0.2 % Final   Performed at [facility], [address], [place], [place] [address]  . Opiates 08/06/2020 NONE DETECTED  NONE DETECTED Final  . Cocaine 08/06/2020 NONE DETECTED  NONE DETECTED Final  . Benzodiazepines 08/06/2020 NONE DETECTED  NONE DETECTED Final  . Amphetamines 08/06/2020 NONE DETECTED  NONE DETECTED Final  .  Tetrahydrocannabinol 08/06/2020 POSITIVE* NONE DETECTED Final  . Barbiturates 08/06/2020 NONE DETECTED  NONE DETECTED Final   Comment: (NOTE) DRUG SCREEN FOR MEDICAL PURPOSES ONLY.  IF CONFIRMATION IS NEEDED FOR ANY PURPOSE, NOTIFY LAB WITHIN 5 DAYS.  LOWEST DETECTABLE LIMITS FOR URINE DRUG SCREEN Drug Class                     Cutoff (ng/mL) Amphetamine and metabolites    1000 Barbiturate and metabolites    200 Benzodiazepine                 200 Tricyclics and metabolites     300 Opiates and metabolites        300 Cocaine and metabolites        300 THC                            50 Performed at [facility], [address], [place], [place] [address]   . SARS Coronavirus 2 by RT PCR 08/06/2020 NEGATIVE  NEGATIVE Final   Comment: (NOTE) SARS-CoV-2 target nucleic acids are NOT DETECTED.  The SARS-CoV-2 RNA is generally detectable in upper respiratoy specimens during the acute phase of infection. The lowest concentration of SARS-CoV-2 viral copies this assay can detect is 131 copies/mL. A negative result does not preclude SARS-Cov-2 infection and should not be used as the sole basis  for treatment or other patient management decisions. A negative result may occur with  improper specimen collection/handling, submission of specimen other than nasopharyngeal swab, presence of viral mutation(s) within the areas targeted by this assay, and inadequate number of viral copies (<131 copies/mL). A negative result must be combined with clinical observations, patient history, and epidemiological information. The expected result is Negative.  Fact Sheet for Patients:  https://www.moore.com/  Fact Sheet for Healthcare Providers:  https://www.young.biz/  This test is no                          t yet approved or cleared by the [place] FDA and  has been authorized for detection and/or diagnosis of SARS-CoV-2 by FDA under an  Emergency Use Authorization (EUA). This EUA will remain  in effect (meaning this test can be used) for the duration of the COVID-19 declaration under Section 564(b)(1) of the Act, 21 U.S.C. section 360bbb-3(b)(1), unless the authorization is terminated or revoked sooner.    . Influenza A by PCR 08/06/2020 NEGATIVE  NEGATIVE Final  . Influenza B by PCR 08/06/2020 NEGATIVE  NEGATIVE Final   Comment: (NOTE) The Xpert Xpress SARS-CoV-2/FLU/RSV assay is intended as an aid in  the diagnosis of influenza from Nasopharyngeal swab specimens and  should not be used as a sole basis for treatment. Nasal washings and  aspirates are unacceptable for Xpert Xpress SARS-CoV-2/FLU/RSV  testing.  Fact Sheet for Patients: https://www.moore.com/  Fact Sheet for Healthcare Providers: https://www.young.biz/  This test is not yet approved or cleared by the [place] FDA and  has been authorized for detection and/or diagnosis of SARS-CoV-2 by  FDA under an Emergency Use Authorization (EUA). This EUA will remain  in effect (meaning this test can be used) for the duration of the  Covid-19 declaration under Section 564(b)(1) of the Act, 21  U.S.C. section 360bbb-3(b)(1), unless the authorization is  terminated or revoked. Performed at [facility], [address] [place], [place] [address]   Admission on 07/25/2020, Discharged on 07/26/2020  Component Date Value Ref Range Status  . Sodium 07/25/2020 137  135 - 145 mmol/L Final  . Potassium 07/25/2020 4.0  3.5 - 5.1 mmol/L Final  . Chloride 07/25/2020 100  98 - 111 mmol/L Final  . CO2 07/25/2020 28  22 - 32 mmol/L Final  . Glucose, Bld 07/25/2020 90  70 - 99 mg/dL Final   Glucose reference range applies only to samples taken after fasting for at least 8 hours.  . BUN 07/25/2020 15  6 - 20 mg/dL Final  . Creatinine, Ser 07/25/2020 1.06  0.61 - 1.24 mg/dL Final  . Calcium 60/45/4098 9.4  8.9 -  10.3 mg/dL Final  . Total Protein 07/25/2020 7.7  6.5 - 8.1 g/dL Final  . Albumin 11/91/4782 5.0  3.5 - 5.0 g/dL Final  . AST 95/62/1308 21  15 - 41 U/L Final  . ALT 07/25/2020 16  0 - 44 U/L Final  . Alkaline Phosphatase 07/25/2020 38  38 - 126 U/L Final  . Total Bilirubin 07/25/2020 1.1  0.3 - 1.2 mg/dL Final  . GFR, Estimated 07/25/2020 >60  >60 mL/min Final  . Anion gap 07/25/2020 9  5 - 15 Final   Performed at [facility], [address] [place], [place] [address]  . Alcohol, Ethyl (B) 07/25/2020 <10  <10 mg/dL Final   Comment: (NOTE) Lowest detectable limit for serum alcohol is 10 mg/dL.  For medical purposes only. Performed at [facility], [address] [place], [place] [address]   . WBC 07/25/2020 7.1  4.0 - 10.5 K/uL Final  . RBC 07/25/2020 4.81  4.22 - 5.81 MIL/uL Final  . Hemoglobin 07/25/2020 15.0  13.0 - 17.0 g/dL Final  . HCT 09/81/191410/17/2021 43.8  39 - 52 % Final  . MCV 07/25/2020 91.1  80.0 - 100.0 fL Final  . MCH 07/25/2020 31.2  26.0 - 34.0 pg Final  . MCHC 07/25/2020 34.2  30.0 - 36.0 g/dL Final  . RDW 78/29/562110/17/2021 13.1  11.5 - 15.5 % Final  . Platelets 07/25/2020 216  150 - 400 K/uL Final  . nRBC 07/25/2020 0.0  0.0 - 0.2 % Final   Performed at [facility], [address] [place], [place] [address]  . Opiates 07/25/2020 NONE DETECTED  NONE DETECTED Final  . Cocaine 07/25/2020 NONE DETECTED  NONE DETECTED Final  . Benzodiazepines 07/25/2020 NONE DETECTED  NONE DETECTED Final  . Amphetamines 07/25/2020 NONE DETECTED  NONE DETECTED Final  . Tetrahydrocannabinol 07/25/2020 POSITIVE* NONE DETECTED Final  . Barbiturates 07/25/2020 NONE DETECTED  NONE DETECTED Final   Comment: (NOTE) DRUG SCREEN FOR MEDICAL PURPOSES ONLY.  IF CONFIRMATION IS NEEDED FOR ANY PURPOSE, NOTIFY LAB WITHIN 5 DAYS.  LOWEST DETECTABLE LIMITS FOR URINE DRUG SCREEN Drug Class                     Cutoff (ng/mL) Amphetamine and  metabolites    1000 Barbiturate and metabolites    200 Benzodiazepine                 200 Tricyclics and metabolites     300 Opiates and metabolites        300 Cocaine and metabolites        300 THC                            50 Performed at [facility], [address] [place], [place] [address]   . SARS Coronavirus 2 by RT PCR 07/25/2020 NEGATIVE  NEGATIVE Final   Comment: (NOTE) SARS-CoV-2 target nucleic acids are NOT DETECTED.  The SARS-CoV-2 RNA is generally detectable in upper respiratoy specimens during the acute phase of infection. The lowest concentration of SARS-CoV-2 viral copies this assay can detect is 131 copies/mL. A negative result does not preclude SARS-Cov-2 infection and should not be used as the sole basis for treatment or other patient management decisions. A negative result may occur with  improper specimen collection/handling, submission of specimen other than nasopharyngeal swab, presence of viral mutation(s) within the areas targeted by this assay, and inadequate number of viral copies (<131 copies/mL). A negative result must be combined with clinical observations, patient history, and epidemiological information. The expected result is Negative.  Fact Sheet for Patients:  https://www.moore.com/https://www.fda.gov/media/142436/download  Fact Sheet for Healthcare Providers:  https://www.young.biz/https://www.fda.gov/media/142435/download  This test is no                          t yet approved or cleared by the [place] FDA and  has been authorized for detection and/or diagnosis of SARS-CoV-2 by FDA under an Emergency Use Authorization (EUA). This EUA will remain  in effect (meaning this test can be used) for the duration of the COVID-19 declaration under Section 564(b)(1) of the Act, 21 U.S.C. section 360bbb-3(b)(1),  unless the authorization is terminated or revoked sooner.    . Influenza A by PCR 07/25/2020 NEGATIVE  NEGATIVE Final  . Influenza B by PCR 07/25/2020  NEGATIVE  NEGATIVE Final   Comment: (NOTE) The Xpert Xpress SARS-CoV-2/FLU/RSV assay is intended as an aid in  the diagnosis of influenza from Nasopharyngeal swab specimens and  should not be used as a sole basis for treatment. Nasal washings and  aspirates are unacceptable for Xpert Xpress SARS-CoV-2/FLU/RSV  testing.  Fact Sheet for Patients: https://www.moore.com/  Fact Sheet for Healthcare Providers: https://www.young.biz/  This test is not yet approved or cleared by the [place] FDA and  has been authorized for detection and/or diagnosis of SARS-CoV-2 by  FDA under an Emergency Use Authorization (EUA). This EUA will remain  in effect (meaning this test can be used) for the duration of the  Covid-19 declaration under Section 564(b)(1) of the Act, 21  U.S.C. section 360bbb-3(b)(1), unless the authorization is  terminated or revoked. Performed at [facility], [address], [place], [place] [address]   Admission on 06/27/2020, Discharged on 06/27/2020  Component Date Value Ref Range Status  . WBC 06/27/2020 5.6  4.0 - 10.5 K/uL Final  . RBC 06/27/2020 4.38  4.22 - 5.81 MIL/uL Final  . Hemoglobin 06/27/2020 13.7  13.0 - 17.0 g/dL Final  . HCT 13/05/6577 39.4  39 - 52 % Final  . MCV 06/27/2020 90.0  80.0 - 100.0 fL Final  . MCH 06/27/2020 31.3  26.0 - 34.0 pg Final  . MCHC 06/27/2020 34.8  30.0 - 36.0 g/dL Final  . RDW 46/96/2952 12.9  11.5 - 15.5 % Final  . Platelets 06/27/2020 204  150 - 400 K/uL Final  . nRBC 06/27/2020 0.0  0.0 - 0.2 % Final   Performed at [facility], [address] [place], [place] [address]  . Sodium 06/27/2020 138  135 - 145 mmol/L Final  . Potassium 06/27/2020 4.0  3.5 - 5.1 mmol/L Final  . Chloride 06/27/2020 102  98 - 111 mmol/L Final  . CO2 06/27/2020 28  22 - 32 mmol/L Final  . Glucose, Bld 06/27/2020 92  70 - 99 mg/dL Final   Glucose reference range applies  only to samples taken after fasting for at least 8 hours.  . BUN 06/27/2020 14  6 - 20 mg/dL Final  . Creatinine, Ser 06/27/2020 1.30* 0.61 - 1.24 mg/dL Final  . Calcium 44/10/270 8.9  8.9 - 10.3 mg/dL Final  . Total Protein 06/27/2020 6.7  6.5 - 8.1 g/dL Final  . Albumin 53/66/4403 4.2  3.5 - 5.0 g/dL Final  . AST 47/42/5956 17  15 - 41 U/L Final  . ALT 06/27/2020 12  0 - 44 U/L Final  . Alkaline Phosphatase 06/27/2020 32* 38 - 126 U/L Final  . Total Bilirubin 06/27/2020 0.6  0.3 - 1.2 mg/dL Final  . GFR calc non Af Amer 06/27/2020 >60  >60 mL/min Final  . GFR calc Af Amer 06/27/2020 >60  >60 mL/min Final  . Anion gap 06/27/2020 8  5 - 15 Final   Performed at [facility], [address], [place], [place] [address]  . Alcohol, Ethyl (B) 06/27/2020 <10  <10 mg/dL Final   Comment: (NOTE) Lowest detectable limit for serum alcohol is 10 mg/dL.  For medical purposes only. Performed at [facility], [address], [place], [place] [address]   . Salicylate Lvl 06/27/2020 <7.0* 7.0 - 30.0  mg/dL Final   Performed at [facility], [address] [place], [place] [address]  . Acetaminophen (Tylenol), Serum 06/27/2020 <10* 10 - 30 ug/mL Final   Comment: (NOTE) Therapeutic concentrations vary significantly. A range of 10-30 ug/mL  may be an effective concentration for many patients. However, some  are best treated at concentrations outside of this range. Acetaminophen concentrations >150 ug/mL at 4 hours after ingestion  and >50 ug/mL at 12 hours after ingestion are often associated with  toxic reactions.  Performed at [facility], [address], [place], [place] [address]   . SARS Coronavirus 2 06/27/2020 NEGATIVE  NEGATIVE Final   Comment: (NOTE) SARS-CoV-2 target nucleic acids are NOT DETECTED.  The SARS-CoV-2 RNA is generally detectable in upper and lower respiratory specimens during the acute phase of  infection. The lowest concentration of SARS-CoV-2 viral copies this assay can detect is 250 copies / mL. A negative result does not preclude SARS-CoV-2 infection and should not be used as the sole basis for treatment or other patient management decisions.  A negative result may occur with improper specimen collection / handling, submission of specimen other than nasopharyngeal swab, presence of viral mutation(s) within the areas targeted by this assay, and inadequate number of viral copies (<250 copies / mL). A negative result must be combined with clinical observations, patient history, and epidemiological information.  Fact Sheet for Patients:   BoilerBrush.com.cy  Fact Sheet for Healthcare Providers: https://pope.com/  This test is not yet approved or                           cleared by the [place] FDA and has been authorized for detection and/or diagnosis of SARS-CoV-2 by FDA under an Emergency Use Authorization (EUA).  This EUA will remain in effect (meaning this test can be used) for the duration of the COVID-19 declaration under Section 564(b)(1) of the Act, 21 U.S.C. section 360bbb-3(b)(1), unless the authorization is terminated or revoked sooner.  Performed at [facility], [address], [place], [place] [address]   Admission on 06/17/2020, Discharged on 06/17/2020  Component Date Value Ref Range Status  . Sodium 06/17/2020 139  135 - 145 mmol/L Final  . Potassium 06/17/2020 3.5  3.5 - 5.1 mmol/L Final  . Chloride 06/17/2020 104  98 - 111 mmol/L Final  . CO2 06/17/2020 23  22 - 32 mmol/L Final  . Glucose, Bld 06/17/2020 109* 70 - 99 mg/dL Final   Glucose reference range applies only to samples taken after fasting for at least 8 hours.  . BUN 06/17/2020 10  6 - 20 mg/dL Final  . Creatinine, Ser 06/17/2020 1.22  0.61 - 1.24 mg/dL Final  . Calcium 27/03/2375 9.3  8.9 - 10.3 mg/dL Final  . Total  Protein 06/17/2020 6.0* 6.5 - 8.1 g/dL Final  . Albumin 28/31/5176 4.1  3.5 - 5.0 g/dL Final  . AST 16/04/3709 21  15 - 41 U/L Final  . ALT 06/17/2020 13  0 - 44 U/L Final  . Alkaline Phosphatase 06/17/2020 31* 38 - 126 U/L Final  . Total Bilirubin 06/17/2020 0.9  0.3 - 1.2 mg/dL Final  . GFR calc non Af Amer 06/17/2020 >60  >60 mL/min Final  . GFR calc Af Amer 06/17/2020 >60  >60 mL/min Final  . Anion gap 06/17/2020 12  5 - 15 Final   Performed at [facility], [address]  [place], [place] [address]  . Alcohol, Ethyl (B) 06/17/2020 <10  <10 mg/dL Final   Comment: (NOTE) Lowest detectable limit for serum alcohol is 10 mg/dL.  For medical purposes only. Performed at [facility], [address] [place], [place] [address]   . Salicylate Lvl 06/17/2020 <7.0* 7.0 - 30.0 mg/dL Final   Performed at [facility], [address], [place], [place] [address]  . Acetaminophen (Tylenol), Serum 06/17/2020 <10* 10 - 30 ug/mL Final   Comment: (NOTE) Therapeutic concentrations vary significantly. A range of 10-30 ug/mL  may be an effective concentration for many patients. However, some  are best treated at concentrations outside of this range. Acetaminophen concentrations >150 ug/mL at 4 hours after ingestion  and >50 ug/mL at 12 hours after ingestion are often associated with  toxic reactions.  Performed at [facility], [address], [place], [place] [address]   . WBC 06/17/2020 5.6  4.0 - 10.5 K/uL Final  . RBC 06/17/2020 4.58  4.22 - 5.81 MIL/uL Final  . Hemoglobin 06/17/2020 13.7  13.0 - 17.0 g/dL Final  . HCT 29/56/2130 41.5  39 - 52 % Final  . MCV 06/17/2020 90.6  80.0 - 100.0 fL Final  . MCH 06/17/2020 29.9  26.0 - 34.0 pg Final  . MCHC 06/17/2020 33.0  30.0 - 36.0 g/dL Final  . RDW 86/57/8469 13.1  11.5 - 15.5 % Final  . Platelets 06/17/2020 217  150 - 400 K/uL Final  . nRBC 06/17/2020 0.0  0.0 - 0.2 % Final   Performed at [facility],  [address], [place], [place] [address]  Admission on 06/11/2020, Discharged on 06/11/2020  Component Date Value Ref Range Status  . Sodium 06/11/2020 140  135 - 145 mmol/L Final  . Potassium 06/11/2020 3.6  3.5 - 5.1 mmol/L Final  . Chloride 06/11/2020 102  98 - 111 mmol/L Final  . CO2 06/11/2020 29  22 - 32 mmol/L Final  . Glucose, Bld 06/11/2020 88  70 - 99 mg/dL Final   Glucose reference range applies only to samples taken after fasting for at least 8 hours.  . BUN 06/11/2020 16  6 - 20 mg/dL Final  . Creatinine, Ser 06/11/2020 1.02  0.61 - 1.24 mg/dL Final  . Calcium 84/13/2440 9.3  8.9 - 10.3 mg/dL Final  . Total Protein 06/11/2020 6.8  6.5 - 8.1 g/dL Final  . Albumin 08/05/2535 4.6  3.5 - 5.0 g/dL Final  . AST 64/40/3474 18  15 - 41 U/L Final  . ALT 06/11/2020 12  0 - 44 U/L Final  . Alkaline Phosphatase 06/11/2020 35* 38 - 126 U/L Final  . Total Bilirubin 06/11/2020 1.3* 0.3 - 1.2 mg/dL Final  . GFR calc non Af Amer 06/11/2020 >60  >60 mL/min Final  . GFR calc Af Amer 06/11/2020 >60  >60 mL/min Final  . Anion gap 06/11/2020 9  5 - 15 Final   Performed at [facility], [address], [place], [place] [address]  . Alcohol, Ethyl (B) 06/11/2020 <10  <10 mg/dL Final   Comment: (NOTE) Lowest detectable limit for serum alcohol is 10 mg/dL.  For medical purposes only. Performed at [facility], [address] [place], [place] [address]   . WBC 06/11/2020 6.3  4.0 - 10.5 K/uL Final  . RBC 06/11/2020 4.80  4.22 - 5.81 MIL/uL Final  . Hemoglobin 06/11/2020 14.8  13.0 - 17.0 g/dL Final  .  HCT 06/11/2020 43.1  39 - 52 % Final  . MCV 06/11/2020 89.8  80.0 - 100.0 fL Final  . MCH 06/11/2020 30.8  26.0 - 34.0 pg Final  . MCHC 06/11/2020 34.3  30.0 - 36.0 g/dL Final  . RDW 96/01/5408 12.9  11.5 - 15.5 % Final  . Platelets 06/11/2020 226  150 - 400 K/uL Final  . nRBC 06/11/2020 0.3* 0.0 - 0.2 % Final   Performed at [facility],  [address], [place], [place] [address]  Admission on 05/24/2020, Discharged on 05/25/2020  Component Date Value Ref Range Status  . SARS Coronavirus 2 05/24/2020 NEGATIVE  NEGATIVE Final   Comment: (NOTE) SARS-CoV-2 target nucleic acids are NOT DETECTED.  The SARS-CoV-2 RNA is generally detectable in upper and lower respiratory specimens during the acute phase of infection. The lowest concentration of SARS-CoV-2 viral copies this assay can detect is 250 copies / mL. A negative result does not preclude SARS-CoV-2 infection and should not be used as the sole basis for treatment or other patient management decisions.  A negative result may occur with improper specimen collection / handling, submission of specimen other than nasopharyngeal swab, presence of viral mutation(s) within the areas targeted by this assay, and inadequate number of viral copies (<250 copies / mL). A negative result must be combined with clinical observations, patient history, and epidemiological information.  Fact Sheet for Patients:   BoilerBrush.com.cy  Fact Sheet for Healthcare Providers: https://pope.com/  This test is not yet approved or                           cleared by the [place] FDA and has been authorized for detection and/or diagnosis of SARS-CoV-2 by FDA under an Emergency Use Authorization (EUA).  This EUA will remain in effect (meaning this test can be used) for the duration of the COVID-19 declaration under Section 564(b)(1) of the Act, 21 U.S.C. section 360bbb-3(b)(1), unless the authorization is terminated or revoked sooner.  Performed at [name], [address] [place], [place] [address]   . WBC 05/24/2020 5.6  4.0 - 10.5 K/uL Final  . RBC 05/24/2020 4.60  4.22 - 5.81 MIL/uL Final  . Hemoglobin 05/24/2020 14.1  13.0 - 17.0 g/dL Final  . HCT 56/21/3086 41.3  39 - 52 % Final  . MCV 05/24/2020 89.8  80.0 - 100.0 fL  Final  . MCH 05/24/2020 30.7  26.0 - 34.0 pg Final  . MCHC 05/24/2020 34.1  30.0 - 36.0 g/dL Final  . RDW 57/84/6962 12.5  11.5 - 15.5 % Final  . Platelets 05/24/2020 219  150 - 400 K/uL Final  . nRBC 05/24/2020 0.0  0.0 - 0.2 % Final  . Neutrophils Relative % 05/24/2020 34  % Final  . Neutro Abs 05/24/2020 1.9  1.7 - 7.7 K/uL Final  . Lymphocytes Relative 05/24/2020 55  % Final  . Lymphs Abs 05/24/2020 3.1  0.7 - 4.0 K/uL Final  . Monocytes Relative 05/24/2020 5  % Final  . Monocytes Absolute 05/24/2020 0.3  0.1 - 1.0 K/uL Final  . Eosinophils Relative 05/24/2020 5  % Final  . Eosinophils Absolute 05/24/2020 0.3  0.0 - 0.5 K/uL Final  . Basophils Relative 05/24/2020 1  % Final  . Basophils Absolute 05/24/2020 0.0  0.0 - 0.1 K/uL Final  . Immature Granulocytes 05/24/2020 0  % Final  . Abs Immature Granulocytes 05/24/2020 0.01  0.00 -  0.07 K/uL Final   Performed at [facility], [address], [place], [place] [address]  . Sodium 05/24/2020 140  135 - 145 mmol/L Final  . Potassium 05/24/2020 3.7  3.5 - 5.1 mmol/L Final  . Chloride 05/24/2020 101  98 - 111 mmol/L Final  . CO2 05/24/2020 27  22 - 32 mmol/L Final  . Glucose, Bld 05/24/2020 101* 70 - 99 mg/dL Final   Glucose reference range applies only to samples taken after fasting for at least 8 hours.  . BUN 05/24/2020 14  6 - 20 mg/dL Final  . Creatinine, Ser 05/24/2020 1.29* 0.61 - 1.24 mg/dL Final  . Calcium 11/91/4782 9.6  8.9 - 10.3 mg/dL Final  . Total Protein 05/24/2020 6.4* 6.5 - 8.1 g/dL Final  . Albumin 95/62/1308 4.1  3.5 - 5.0 g/dL Final  . AST 65/78/4696 18  15 - 41 U/L Final  . ALT 05/24/2020 12  0 - 44 U/L Final  . Alkaline Phosphatase 05/24/2020 37* 38 - 126 U/L Final  . Total Bilirubin 05/24/2020 0.6  0.3 - 1.2 mg/dL Final  . GFR calc non Af Amer 05/24/2020 >60  >60 mL/min Final  . GFR calc Af Amer 05/24/2020 >60  >60 mL/min Final  . Anion gap 05/24/2020 12  5 - 15 Final   Performed at [facility], [address] [place], [place] [address]  . SARS Coronavirus 2 Ag 05/25/2020 NEGATIVE  NEGATIVE Final   Comment: (NOTE) SARS-CoV-2 antigen NOT DETECTED.   Negative results are presumptive.  Negative results do not preclude SARS-CoV-2 infection and should not be used as the sole basis for treatment or other patient management decisions, including infection  control decisions, particularly in the presence of clinical signs and  symptoms consistent with COVID-19, or in those who have been in contact with the virus.  Negative results must be combined with clinical observations, patient history, and epidemiological information. The expected result is Negative.  Fact Sheet for Patients: https://sanders-williams.net/  Fact Sheet for Healthcare Providers: https://martinez.com/   This test is not yet approved or cleared by the [place] FDA and  has been authorized for detection and/or diagnosis of SARS-CoV-2 by FDA under an Emergency Use Authorization (EUA).  This EUA will remain in effect (meaning this test can be used) for the duration of  the C                          OVID-19 declaration under Section 564(b)(1) of the Act, 21 U.S.C. section 360bbb-3(b)(1), unless the authorization is terminated or revoked sooner.    Admission on 05/08/2020, Discharged on 05/08/2020  Component Date Value Ref Range Status  . WBC 05/08/2020 5.3  4.0 - 10.5 K/uL Final  . RBC 05/08/2020 4.78  4.22 - 5.81 MIL/uL Final  . Hemoglobin 05/08/2020 14.8  13.0 - 17.0 g/dL Final  . HCT 41/32/4401 43.5  39 - 52 % Final  . MCV 05/08/2020 91.0  80.0 - 100.0 fL Final  . MCH 05/08/2020 31.0  26.0 - 34.0 pg Final  . MCHC 05/08/2020 34.0  30.0 - 36.0 g/dL Final  . RDW 02/72/5366 12.5  11.5 - 15.5 % Final  . Platelets 05/08/2020 214  150 - 400 K/uL Final  . nRBC 05/08/2020 0.0  0.0 - 0.2 % Final  . Neutrophils Relative % 05/08/2020 27  % Final  . Neutro Abs 05/08/2020 1.5* 1.7 -  7.7 K/uL Final  .  Lymphocytes Relative 05/08/2020 60  % Final  . Lymphs Abs 05/08/2020 3.2  0.7 - 4.0 K/uL Final  . Monocytes Relative 05/08/2020 6  % Final  . Monocytes Absolute 05/08/2020 0.3  0.1 - 1.0 K/uL Final  . Eosinophils Relative 05/08/2020 6  % Final  . Eosinophils Absolute 05/08/2020 0.3  0.0 - 0.5 K/uL Final  . Basophils Relative 05/08/2020 1  % Final  . Basophils Absolute 05/08/2020 0.0  0.0 - 0.1 K/uL Final  . Immature Granulocytes 05/08/2020 0  % Final  . Abs Immature Granulocytes 05/08/2020 0.01  0.00 - 0.07 K/uL Final   Performed at [facility], [address], [place], [place] [address]  . Sodium 05/08/2020 139  135 - 145 mmol/L Final  . Potassium 05/08/2020 3.5  3.5 - 5.1 mmol/L Final  . Chloride 05/08/2020 103  98 - 111 mmol/L Final  . CO2 05/08/2020 26  22 - 32 mmol/L Final  . Glucose, Bld 05/08/2020 97  70 - 99 mg/dL Final   Glucose reference range applies only to samples taken after fasting for at least 8 hours.  . BUN 05/08/2020 13  6 - 20 mg/dL Final  . Creatinine, Ser 05/08/2020 1.33* 0.61 - 1.24 mg/dL Final  . Calcium 60/45/4098 9.3  8.9 - 10.3 mg/dL Final  . Total Protein 05/08/2020 6.6  6.5 - 8.1 g/dL Final  . Albumin 11/91/4782 4.2  3.5 - 5.0 g/dL Final  . AST 95/62/1308 22  15 - 41 U/L Final  . ALT 05/08/2020 14  0 - 44 U/L Final  . Alkaline Phosphatase 05/08/2020 34* 38 - 126 U/L Final  . Total Bilirubin 05/08/2020 1.0  0.3 - 1.2 mg/dL Final  . GFR calc non Af Amer 05/08/2020 >60  >60 mL/min Final  . GFR calc Af Amer 05/08/2020 >60  >60 mL/min Final  . Anion gap 05/08/2020 10  5 - 15 Final   Performed at [facility], [address], [place], [place] [address]  . POC Amphetamine UR 05/08/2020 None Detected  None Detected Preliminary  . POC Secobarbital (BAR) 05/08/2020 None Detected  None Detected Preliminary  . POC Buprenorphine (BUP) 05/08/2020 None Detected  None Detected Preliminary  . POC Oxazepam (BZO) 05/08/2020 None Detected   None Detected Preliminary  . POC Cocaine UR 05/08/2020 None Detected  None Detected Preliminary  . POC Methamphetamine UR 05/08/2020 None Detected  None Detected Preliminary  . POC Morphine 05/08/2020 None Detected  None Detected Preliminary  . POC Oxycodone UR 05/08/2020 None Detected  None Detected Preliminary  . POC Methadone UR 05/08/2020 None Detected  None Detected Preliminary  . POC Marijuana UR 05/08/2020 Positive* None Detected Preliminary  . SARS Coronavirus 2 Ag 05/08/2020 NEGATIVE  NEGATIVE Final   Comment: (NOTE) SARS-CoV-2 antigen NOT DETECTED.   Negative results are presumptive.  Negative results do not preclude SARS-CoV-2 infection and should not be used as the sole basis for treatment or other patient management decisions, including infection  control decisions, particularly in the presence of clinical signs and  symptoms consistent with COVID-19, or in those who have been in contact with the virus.  Negative results must be combined with clinical observations, patient history, and epidemiological information. The expected result is Negative.  Fact Sheet for Patients: https://sanders-williams.net/  Fact Sheet for Healthcare Providers: https://martinez.com/   This test is not yet approved or cleared by the [place] FDA and  has been authorized for detection and/or diagnosis of SARS-CoV-2 by FDA  under an Emergency Use Authorization (EUA).  This EUA will remain in effect (meaning this test can be used) for the duration of  the C                          OVID-19 declaration under Section 564(b)(1) of the Act, 21 U.S.C. section 360bbb-3(b)(1), unless the authorization is terminated or revoked sooner.    Admission on 02/19/2020, Discharged on 02/22/2020  Component Date Value Ref Range Status  . SARS Coronavirus 2 02/20/2020 NEGATIVE  NEGATIVE Final   Comment: (NOTE) SARS-CoV-2 target nucleic acids are NOT DETECTED. The  SARS-CoV-2 RNA is generally detectable in upper and lower respiratory specimens during the acute phase of infection. The lowest concentration of SARS-CoV-2 viral copies this assay can detect is 250 copies / mL. A negative result does not preclude SARS-CoV-2 infection and should not be used as the sole basis for treatment or other patient management decisions.  A negative result may occur with improper specimen collection / handling, submission of specimen other than nasopharyngeal swab, presence of viral mutation(s) within the areas targeted by this assay, and inadequate number of viral copies (<250 copies / mL). A negative result must be combined with clinical observations, patient history, and epidemiological information. Fact Sheet for Patients:   BoilerBrush.com.cy Fact Sheet for Healthcare Providers: https://pope.com/ This test is not yet approved or cleared                           by the [place] FDA and has been authorized for detection and/or diagnosis of SARS-CoV-2 by FDA under an Emergency Use Authorization (EUA).  This EUA will remain in effect (meaning this test can be used) for the duration of the COVID-19 declaration under Section 564(b)(1) of the Act, 21 U.S.C. section 360bbb-3(b)(1), unless the authorization is terminated or revoked sooner. Performed at [facility], [address], [place], [place] [address]   . Acetaminophen (Tylenol), Serum 02/19/2020 <10* 10 - 30 ug/mL Final   Comment: (NOTE) Therapeutic concentrations vary significantly. A range of 10-30 ug/mL  may be an effective concentration for many patients. However, some  are best treated at concentrations outside of this range. Acetaminophen concentrations >150 ug/mL at 4 hours after ingestion  and >50 ug/mL at 12 hours after ingestion are often associated with  toxic reactions. Performed at [facility], [address] [place], [place] [address]   . Sodium 02/19/2020 139  135 - 145 mmol/L Final  . Potassium 02/19/2020 4.1  3.5 - 5.1 mmol/L Final  . Chloride 02/19/2020 103  98 - 111 mmol/L Final  . CO2 02/19/2020 28  22 - 32 mmol/L Final  . Glucose, Bld 02/19/2020 84  70 - 99 mg/dL Final   Glucose reference range applies only to samples taken after fasting for at least 8 hours.  . BUN 02/19/2020 15  6 - 20 mg/dL Final  . Creatinine, Ser 02/19/2020 1.04  0.61 - 1.24 mg/dL Final  . Calcium 58/06/9832 9.2  8.9 - 10.3 mg/dL Final  . Total Protein 02/19/2020 6.4* 6.5 - 8.1 g/dL Final  . Albumin 82/50/5397 4.0  3.5 - 5.0 g/dL Final  . AST 67/34/1937 18  15 - 41 U/L Final  . ALT 02/19/2020 13  0 - 44 U/L Final  . Alkaline Phosphatase 02/19/2020 36* 38 - 126 U/L Final  . Total Bilirubin 02/19/2020 0.8  0.3 -  1.2 mg/dL Final  . GFR calc non Af Amer 02/19/2020 >60  >60 mL/min Final  . GFR calc Af Amer 02/19/2020 >60  >60 mL/min Final  . Anion gap 02/19/2020 8  5 - 15 Final   Performed at [facility], [address], [place], [place] [address]  . Alcohol, Ethyl (B) 02/19/2020 <10  <10 mg/dL Final   Comment: (NOTE) Lowest detectable limit for serum alcohol is 10 mg/dL. For medical purposes only. Performed at [facility], [address], [place], [place] [address]   . WBC 02/19/2020 5.5  4.0 - 10.5 K/uL Final  . RBC 02/19/2020 4.45  4.22 - 5.81 MIL/uL Final  . Hemoglobin 02/19/2020 13.9  13.0 - 17.0 g/dL Final  . HCT 09/81/1914 40.5  39 - 52 % Final  . MCV 02/19/2020 91.0  80.0 - 100.0 fL Final  . MCH 02/19/2020 31.2  26.0 - 34.0 pg Final  . MCHC 02/19/2020 34.3  30.0 - 36.0 g/dL Final  . RDW 78/29/5621 13.0  11.5 - 15.5 % Final  . Platelets 02/19/2020 204  150 - 400 K/uL Final  . nRBC 02/19/2020 0.0  0.0 - 0.2 % Final  . Neutrophils Relative % 02/19/2020 29  % Final  . Neutro Abs 02/19/2020 1.6* 1.7 - 7.7 K/uL Final  . Lymphocytes Relative 02/19/2020 54   % Final  . Lymphs Abs 02/19/2020 3.0  0.7 - 4.0 K/uL Final  . Monocytes Relative 02/19/2020 7  % Final  . Monocytes Absolute 02/19/2020 0.4  0.1 - 1.0 K/uL Final  . Eosinophils Relative 02/19/2020 9  % Final  . Eosinophils Absolute 02/19/2020 0.5  0.0 - 0.5 K/uL Final  . Basophils Relative 02/19/2020 1  % Final  . Basophils Absolute 02/19/2020 0.0  0.0 - 0.1 K/uL Final  . Immature Granulocytes 02/19/2020 0  % Final  . Abs Immature Granulocytes 02/19/2020 0.01  0.00 - 0.07 K/uL Final   Performed at [facility], [address] [place], [place] [address]  . Color, Urine 02/20/2020 YELLOW  YELLOW Final  . APPearance 02/20/2020 CLEAR  CLEAR Final  . Specific Gravity, Urine 02/20/2020 1.023  1.005 - 1.030 Final  . pH 02/20/2020 6.0  5.0 - 8.0 Final  . Glucose, UA 02/20/2020 NEGATIVE  NEGATIVE mg/dL Final  . Hgb urine dipstick 02/20/2020 NEGATIVE  NEGATIVE Final  . Bilirubin Urine 02/20/2020 NEGATIVE  NEGATIVE Final  . Ketones, ur 02/20/2020 NEGATIVE  NEGATIVE mg/dL Final  . Protein, ur 78/46/9629 NEGATIVE  NEGATIVE mg/dL Final  . Nitrite 52/84/1324 NEGATIVE  NEGATIVE Final  . Glori Luis 02/20/2020 NEGATIVE  NEGATIVE Final   Performed at [facility], [address], [place], [place] [address]  . Opiates 02/20/2020 NONE DETECTED  NONE DETECTED Final  . Cocaine 02/20/2020 NONE DETECTED  NONE DETECTED Final  . Benzodiazepines 02/20/2020 NONE DETECTED  NONE DETECTED Final  . Amphetamines 02/20/2020 NONE DETECTED  NONE DETECTED Final  . Tetrahydrocannabinol 02/20/2020 POSITIVE* NONE DETECTED Final  . Barbiturates 02/20/2020 NONE DETECTED  NONE DETECTED Final   Comment: (NOTE) DRUG SCREEN FOR MEDICAL PURPOSES ONLY.  IF CONFIRMATION IS NEEDED FOR ANY PURPOSE, NOTIFY LAB WITHIN 5 DAYS. LOWEST DETECTABLE LIMITS FOR URINE DRUG SCREEN Drug Class                     Cutoff (ng/mL) Amphetamine and metabolites    1000 Barbiturate and metabolites     200 Benzodiazepine  200 Tricyclics and metabolites     300 Opiates and metabolites        300 Cocaine and metabolites        300 THC                            50 Performed at [facility], [address] [place], [place] [address]     Allergies: Bee pollen and Shellfish allergy  PTA Medications: (Not in a hospital admission)   Medical Decision Making  Patient was medically cleared in the emergency department  Reviewed ED labs CBC and CMP unremarkable  Resume Invega 6 mg daily for schizophrenia, first dose now Resume Cogentin 1 mg BID for EPS    Recommendations  Based on my evaluation the patient does not appear to have an emergency medical condition.  Jackelyn Poling, NP 08/06/20  6:32 AM

## 2020-08-06 NOTE — ED Provider Notes (Signed)
FBC/OBS ASAP Discharge Summary  Date and Time: 08/06/2020 11:59 AM  Name: [name]  MRN:  [id number]   Discharge Diagnoses:  Final diagnoses:  Schizophrenia, unspecified type (HCC)    Subjective: Chart reviewed- patient medication non compliant with invega 6 mg yesterday and has refused today.Patient interviewed bedside this AM in the observation area. Pt is minimally participatory and somewhat bizarre; however, on chart review, this is c/w patient's presentations to the hospital. He states that he presented to the hospital because "I am scared" and "I want to get my mind right". Patient is unable to clarify these statements and repeats the same thing when asked for additional details or states "I don't know". He denies SI/HI/AVH. No delusional TC volunteered or ellicited.  Pt states that he does not want to take medication which is why he has been refusing risperdal and does not want medications on discharge because he does not like how they make him feel.He is able to contract for safety and  indicates that he is ready for discharge. Advised patient to follow up at [facility] and information was placed in discharge tab. Appears to have thought blocking during interview, but does not appear to be RIS.   Stay Summary: patient presented voluntarily to ED this AM requesting help with mental health. Denies SI/HI/AVH but was unable to contract for safety and therefore was transferred to [facility] for continuous observation and assessment. He refused scheduled risperdal  Legal guardian : [name] [phone] attempted but unsuccessful to reach regarding discharge   Summary of psychiatric presentations 07/26/20- picked up outside of gas station requesting his mind be checked and said he needs medications, said he is "scared for his life" and "not thinking right". Admitted to obs.  Refused medications while here and reported to providers he would not take them. Discharged following day 06/28/20- presented  voluntarily, said he couldn't breathe and needed medications to focus on reality. Took medications ordered for him. Discharged the following day. 06/17/20-presented voluntarily to ED for "medication to get my brain right" and requesting medication to help him focus. Psych cleared 05/25/20- presented after he called 911 for feeling paranoid and asking for medication. Was discharged 05/08/20- presented after he called 911 for feeling paranoid. Given medications., discharged 02/22/20-02/24/20- admitted for 3 days to [facility]- was discharged on invega 6 mg, trazodone 100 mg qhs, benztropine 1 mg BID. Was to have [facility]    Total Time spent with patient: 20 minutes  Past Psychiatric History: see H&P Past Medical History:  Past Medical History:  Diagnosis Date  . Depression   . Schizophrenia [facility])     Past Surgical History:  Procedure Laterality Date  . BACK SURGERY     Family History: History reviewed. No pertinent family history. Family Psychiatric History: see H&P Social History:  Social History   Substance and Sexual Activity  Alcohol Use No     Social History   Substance and Sexual Activity  Drug Use Yes  . Types: Marijuana   Comment: rare    Social History   Socioeconomic History  . Marital status: Single    Spouse name: Not on file  . Number of children: Not on file  . Years of education: Not on file  . Highest education level: Not on file  Occupational History  . Occupation: unemployed  Tobacco Use  . Smoking status: Current Some Day Smoker    Types: Cigarettes  . Smokeless tobacco: Never Used  Vaping Use  .  Vaping Use: Never used  Substance and Sexual Activity  . Alcohol use: No  . Drug use: Yes    Types: Marijuana    Comment: rare  . Sexual activity: Yes    Birth control/protection: Condom  Other Topics Concern  . Not on file  Social History Narrative   UTA certain topics - tangential thought process, cannot answer clearly. Pt not sure  about school; reportedly is homeless.    Social Determinants of Health   Financial Resource Strain:   . Difficulty of Paying Living Expenses: Not on file  Food Insecurity:   . Worried About Programme researcher, broadcasting/film/video in the Last Year: Not on file  . Ran Out of Food in the Last Year: Not on file  Transportation Needs:   . Lack of Transportation (Medical): Not on file  . Lack of Transportation (Non-Medical): Not on file  Physical Activity:   . Days of Exercise per Week: Not on file  . Minutes of Exercise per Session: Not on file  Stress:   . Feeling of Stress : Not on file  Social Connections:   . Frequency of Communication with Friends and Family: Not on file  . Frequency of Social Gatherings with Friends and Family: Not on file  . Attends Religious Services: Not on file  . Active Member of Clubs or Organizations: Not on file  . Attends Banker Meetings: Not on file  . Marital Status: Not on file   SDOH:  SDOH Screenings   Alcohol Screen: Low Risk   . Last Alcohol Screening Score (AUDIT): 0  Depression (PHQ2-9): Medium Risk  . PHQ-2 Score: 6  Financial Resource Strain:   . Difficulty of Paying Living Expenses: Not on file  Food Insecurity:   . Worried About Programme researcher, broadcasting/film/video in the Last Year: Not on file  . Ran Out of Food in the Last Year: Not on file  Housing:   . Last Housing Risk Score: Not on file  Physical Activity:   . Days of Exercise per Week: Not on file  . Minutes of Exercise per Session: Not on file  Social Connections:   . Frequency of Communication with Friends and Family: Not on file  . Frequency of Social Gatherings with Friends and Family: Not on file  . Attends Religious Services: Not on file  . Active Member of Clubs or Organizations: Not on file  . Attends Banker Meetings: Not on file  . Marital Status: Not on file  Stress:   . Feeling of Stress : Not on file  Tobacco Use: High Risk  . Smoking Tobacco Use: Current Some Day  Smoker  . Smokeless Tobacco Use: Never Used  Transportation Needs:   . Freight forwarder (Medical): Not on file  . Lack of Transportation (Non-Medical): Not on file    Has this patient used any form of tobacco in the last 30 days? (Cigarettes, Smokeless Tobacco, Cigars, and/or Pipes) Prescription not provided because: n/ a Current Medications:  Current Facility-Administered Medications  Medication Dose Route Frequency Provider Last Rate Last Admin  . acetaminophen (TYLENOL) tablet 650 mg  650 mg Oral Q6H PRN [name] A, NP      . alum & mag hydroxide-simeth (MAALOX/MYLANTA) 200-200-20 MG/5ML suspension 30 mL  30 mL Oral Q4H PRN [name] A, NP      . benztropine (COGENTIN) tablet 1 mg  1 mg Oral BID Jackelyn Poling, NP      .  magnesium hydroxide (MILK OF MAGNESIA) suspension 30 mL  30 mL Oral Daily PRN [name] A, NP      . paliperidone (INVEGA) 24 hr tablet 6 mg  6 mg Oral Daily Jackelyn Poling, NP       No current outpatient medications on file.    PTA Medications: (Not in a hospital admission)   Musculoskeletal  Strength & Muscle Tone: within normal limits Gait & Station: normal Patient leans: N/A  Psychiatric Specialty Exam  Presentation  General Appearance: Appropriate for Environment;Casual;Fairly Groomed  Eye Contact:Fair  Speech:Blocked;Normal Rate  Speech Volume:Normal  Handedness:Right   Mood and Affect  Mood:Anxious  Affect:Appropriate;Blunt   Thought Process  Thought Processes:Coherent (+thought blocking, linear to direct questions)  Descriptions of Associations:Intact  Orientation:Full (Time, Place and Person)  Thought Content:Logical  Hallucinations:Hallucinations: Other (comment)  Ideas of Reference:None  Suicidal Thoughts:Suicidal Thoughts: No  Homicidal Thoughts:Homicidal Thoughts: No   Sensorium  Memory:Immediate Fair;Recent Fair  Judgment:Intact (limited)  Insight:Other (comment) (limited)   Executive Functions   Concentration:Fair  Attention Span:Fair  Recall:Fair  [employer] of Knowledge:Fair  Language:Fair   Psychomotor Activity  Psychomotor Activity:Psychomotor Activity: Normal   Assets  Assets:Desire for Improvement;Physical Health;Social Support   Sleep  Sleep:Sleep: Fair   Physical Exam  Physical Exam Constitutional:      Appearance: Normal appearance. He is normal weight.  HENT:     Head: Normocephalic and atraumatic.  Eyes:     Extraocular Movements: Extraocular movements intact.  Pulmonary:     Effort: Pulmonary effort is normal.  Musculoskeletal:     Cervical back: Normal range of motion.  Neurological:     Mental Status: He is alert.    Review of Systems  Constitutional: Negative for chills and fever.  Psychiatric/Behavioral: Positive for substance abuse. Negative for hallucinations and suicidal ideas.   Blood pressure 109/81, pulse 75, temperature 98.5 F (36.9 C), temperature source Oral, resp. rate 18, SpO2 100 %. There is no height or weight on file to calculate BMI.  Demographic Factors:  Male and Unemployed  Loss Factors: NA  Historical Factors: NA  Risk Reduction Factors:   Living with another person, especially a relative and Positive social support  Continued Clinical Symptoms:  Alcohol/Substance Abuse/Dependencies Previous Psychiatric Diagnoses and Treatments  Cognitive Features That Contribute To Risk:  None    Suicide Risk:  Minimal: No identifiable suicidal ideation.  Patients presenting with no risk factors but with morbid ruminations; may be classified as minimal risk based on the severity of the depressive symptoms  Plan Of Care/Follow-up recommendations:  Continue activity as tolerated. Continue diet as recommended by your PCP. Ensure to keep all appointments with outpatient providers. Patient provided with walk in information at [facility]. Patient does not meet criteria for inpatient admission at this time as he is not a danger to self,  others.   Disposition: discharge home with bus pass    Estella Husk, MD 08/06/2020, 11:59 AM

## 2020-08-06 NOTE — ED Notes (Signed)
Patient states " I fear for my life", patient denies SI/HI.

## 2020-08-06 NOTE — ED Notes (Signed)
Pt educated about avs. Verbalized understanding. Gave 2 bus passes per pt request for transportation. Pt escorted to retrieve belongings. Ambulated per self. Pt escorted to front lobby and d/c. No new issues. Pt stable at time of d/c

## 2020-08-06 NOTE — [facility] Counselor (Signed)
Clinician attempted to contact pt's legal guardian [name], [phone]. Clinician left a HIPPA compliant voice message and call back information.    Redmond Pulling, MS, [facility], [facility] Triage Specialist [phone].

## 2020-08-06 NOTE — Discharge Instructions (Signed)
Please come to [facility] (this facility) during walk in hours for appointment with psychiatrist/therapist. Walk in hours are 8-11 AM Monday through Thursday. There is often a wait, and it is best to arrive by 7:30 AM.   Address:  [address] in [place], [address] Ph: [phone]

## 2020-08-06 NOTE — ED Notes (Signed)
Clinician attempted to contact pt's legal guardian [name], [phone]. Clinician left a HIPPA compliant voice message and call back information.

## 2020-08-06 NOTE — BH Assessment (Addendum)
Tele Assessment Note   Patient Name: [name] MRN: [id number] Referring Physician: Dr. Paula Libra.  Location of Patient: [facility], [phone]. Location of Provider: Behavioral Health TTS Department  Kayon Dozier is an 29 y.o. male, who presents voluntary and unaccompanied to [facility]. Pt was a poor historian during the assessment. Clinician asked the pt, "what brought you to the hospital?" Pt reported, "I'm scared for my life." Pt reported, he does not know why he is scared. Pt reported, he needs some medication to help him focus on reality. Pt reported, "I need a chill pill." Pt denies, SI, HI, AVH, self-injurious behaviors and access to weapons.   An excerpt from Reola Calkins, FNP note on 07/26/2020, "Patient was restarted on his home medications of Invega 6 mg p.o. daily and Cogentin however, patient had stated that he was not going to take the medications and did not want to continue them. Patient did agree to follow-up with outpatient services at [facility] at open access. Patient was provided with the information for open access. Patient had continued to deny any suicidal homicidal ideations and denied any hallucinations. Patient was demanding to be discharged so that he can go back home. Patient does not meet inpatient psychiatric treatment criteria and was discharged."   Initially, pt denies using substances. Clinician expressed to pt his UDS is positive for marijuana and asked when was the last time he smoked, pt replied, "I don't know." Pt has previous inpatient admissions.  Pt presents quiet, awake under covers with most of his face hidden. Pt's eye contact was fair. Pt's mood was anxious. Pt's affect was anxious, flat. Pt's thought process was circumstantial. Pt's judgement was impaired. Pt was oriented x2. Pt's concentration and impulse control was fair. Pt's insight was poor. Pt reported, if discharged from [facility] he can not contract for safety but was unable to express why she can not contract  for safety. Clinician discussed with the pt being consistent with taking his medications and seeing a psychiatrist.   Diagnosis: Schizophrenia.  Past Medical History:  Past Medical History:  Diagnosis Date  . Depression   . Schizophrenia [facility])     Past Surgical History:  Procedure Laterality Date  . BACK SURGERY      Family History: No family history on file.  Social History:  reports that he has been smoking cigarettes. He has never used smokeless tobacco. He reports current drug use. Drug: Marijuana. He reports that he does not drink alcohol.  Additional Social History:  Alcohol / Drug Use Pain Medications: See MAR Prescriptions: See MAR Over the Counter: See MAR History of alcohol / drug use?: Yes Substance #1 Name of Substance 1: Marijuana. 1 - Age of First Use: UTA 1 - Amount (size/oz): Pt reported, "I can't remember." 1 - Frequency: UTA 1 - Duration: UTA 1 - Last Use / Amount: UTA  CIWA: CIWA-Ar BP: 139/82 Pulse Rate: 67 COWS:    Allergies:  Allergies  Allergen Reactions  . Bee Pollen Itching  . Shellfish Allergy Hives and Itching    seafood    Home Medications: (Not in a hospital admission)   OB/GYN Status:  No LMP for male patient.  General Assessment Data Location of Assessment: [facility] TTS Assessment: In system Is this a Tele or Face-to-Face Assessment?: Tele Assessment Is this an Initial Assessment or a Re-assessment for this encounter?: Initial Assessment Patient Accompanied by:: N/A Language Other than English: No Living Arrangements: Other (Comment) (Pt reported, "I don't know." ) What  gender do you identify as?: Male Date Telepsych consult ordered in [facility]: 08/06/20 Time Telepsych consult ordered in [facility]: 0118 Marital status: Single Living Arrangements: Other (Comment) (Pt reported, "I don't know." ) Can pt return to current living arrangement?: Yes Admission Status: Voluntary Is patient capable of signing voluntary admission?: Yes Referral  Source: Self/Family/Friend Insurance type: Medicaid.      Crisis Care Plan Living Arrangements: Other (Comment) (Pt reported, "I don't know." ) Legal Guardian: Other: [name], legal guardian. ) Name of Psychiatrist: Pt denies.  Name of Therapist: Pt denies.   Education Status Is patient currently in school?: No Is the patient employed, unemployed or receiving disability?: Unemployed (Per pt. )  Risk to self with the past 6 months Suicidal Ideation: No Has patient been a risk to self within the past 6 months prior to admission? : No Suicidal Intent: No Has patient had any suicidal intent within the past 6 months prior to admission? : No Is patient at risk for suicide?: No Suicidal Plan?: No Has patient had any suicidal plan within the past 6 months prior to admission? : No Access to Means: No What has been your use of drugs/alcohol within the last 12 months?: Marijuana.  Previous Attempts/Gestures: No How many times?: 0 Other Self Harm Risks: NA Triggers for Past Attempts: None known Intentional Self Injurious Behavior: None (Pt denies. ) Family Suicide History: No Recent stressful life event(s): Other (Comment) (Pt denies, stressors.) Persecutory voices/beliefs?: No Depression: No Depression Symptoms:  (Pt denies, depressive symptoms. ) Substance abuse history and/or treatment for substance abuse?: Yes Suicide prevention information given to non-admitted patients: Not applicable  Risk to Others within the past 6 months Homicidal Ideation: No (Pt denies. ) Does patient have any lifetime risk of violence toward others beyond the six months prior to admission? : No Thoughts of Harm to Others: No Current Homicidal Intent: No Current Homicidal Plan: No Access to Homicidal Means: No Identified Victim: NA History of harm to others?: No Assessment of Violence: None Noted Violent Behavior Description: NA Does patient have access to weapons?: No Criminal Charges  Pending?: No Does patient have a court date: No Is patient on probation?: No  Psychosis Hallucinations: None noted Delusions: Persecutory  Mental Status Report Appearance/Hygiene: Other (Comment) (Pt  under covers with most of his face hidden.) Eye Contact: Fair Motor Activity: Unremarkable Speech: Soft Level of Consciousness: Quiet/awake Mood: Anxious Affect: Anxious, Flat Anxiety Level: Moderate Thought Processes: Circumstantial Judgement: Impaired Orientation: Person, Place Obsessive Compulsive Thoughts/Behaviors: None  Cognitive Functioning Concentration: Fair Memory: Recent Impaired Is patient IDD: No Insight: Poor Impulse Control: Fair Appetite: Fair Sleep:  (Pt reported, "alright." ) Vegetative Symptoms: Unable to Assess  ADLScreening [facility] Assessment Services) Patient's cognitive ability adequate to safely complete daily activities?: Yes Patient able to express need for assistance with ADLs?: Yes Independently performs ADLs?: Yes (appropriate for developmental age)  Prior Inpatient Therapy Prior Inpatient Therapy: Yes (Per chart. ) Prior Therapy Dates: Per chart, "02-22-20 & 01-05-20."  Prior Therapy Facilty/Provider(s): Per chart, "[facility]." Reason for Treatment: Per chart, "Schizophrenia."   Prior Outpatient Therapy Prior Outpatient Therapy: No Does patient have an ACCT team?: No Does patient have Intensive In-House Services?  : No Does patient have [facility] services? : No Does patient have P4CC services?: No  ADL Screening (condition at time of admission) Patient's cognitive ability adequate to safely complete daily activities?: Yes Is the patient deaf or have difficulty hearing?: No Does the patient have difficulty seeing, even when wearing  glasses/contacts?: No Does the patient have difficulty concentrating, remembering, or making decisions?: Yes Patient able to express need for assistance with ADLs?: Yes Does the patient have difficulty dressing or  bathing?: No Independently performs ADLs?: Yes (appropriate for developmental age) Does the patient have difficulty walking or climbing stairs?: No Weakness of Legs: None Weakness of Arms/Hands: None  Home Assistive Devices/Equipment Home Assistive Devices/Equipment: None    Abuse/Neglect Assessment (Assessment to be complete while patient is alone) Abuse/Neglect Assessment Can Be Completed: Yes Physical Abuse: Denies Verbal Abuse: Denies Sexual Abuse: Denies Exploitation of patient/patient's resources: Denies Self-Neglect: Denies     Merchant navy officer (For Healthcare) Does Patient Have a Medical Advance Directive?: No          Disposition: [name], NP recommends pt to be transferred to [facility] for Continuing Observation. Clinician discussed disposition with Dr. Read Drivers and Denyse Amass, RN.    Disposition Initial Assessment Completed for this Encounter: Yes  This service was provided via telemedicine using a 2-way, interactive audio and video technology.  Names of all persons participating in this telemedicine service and their role in this encounter. Name: Yaakov Saindon. Role: Patient.   Name: Redmond Pulling, MS, [facility], [facility]. Role: Counselor.           Redmond Pulling 08/06/2020 5:22 AM      Redmond Pulling, MS, [facility], [facility] Triage Specialist [phone]

## 2020-08-06 NOTE — ED Triage Notes (Signed)
Pt arrives via safe transport from [facility]. Pt states he doesn't know what's going on andisn't in his right mind. Pt denies SI/HI/AVH. Calm & cooperative, in no acute distress at this time

## 2020-08-06 NOTE — ED Notes (Signed)
Pt refused to take meds. Several attempts made. Would not state why, only stated he wasn't taking any meds. MD notified

## 2020-08-06 NOTE — ED Provider Notes (Addendum)
[facility]-EMERGENCY DEPT Provider Note: Lowella Dell, MD, FACEP  CSN: [id number] MRN: [id number] ARRIVAL: 08/05/20 at 2251 ROOM: WA29/WA29   CHIEF COMPLAINT  Psychiatric Evaluation  Level 5 caveat: Nonverbal HISTORY OF PRESENT ILLNESS  08/06/20 12:07 AM [name] is a 29 y.o. male with a history of schizophrenia and frequent visits to the ED for same.  He is here stating he needs medication for his mind.  Nevertheless his legal guardian called and told the triage nurse that the patient has refused to take his medications when he has not in the hospital.  The guardian requests getting the patient admitted to [facility] as an inpatient.  The patient is nonverbal with staff and myself but has not been uncooperative or defiant.  Past Medical History:  Diagnosis Date  . Depression   . Schizophrenia [facility])     Past Surgical History:  Procedure Laterality Date  . BACK SURGERY      No family history on file.  Social History   Tobacco Use  . Smoking status: Current Some Day Smoker    Types: Cigarettes  . Smokeless tobacco: Never Used  Vaping Use  . Vaping Use: Never used  Substance Use Topics  . Alcohol use: No  . Drug use: Yes    Types: Marijuana    Comment: rare    Prior to Admission medications   Not on File    Allergies Bee pollen and Shellfish allergy   REVIEW OF SYSTEMS  Cannot assess: Patient nonverbal   PHYSICAL EXAMINATION  Initial Vital Signs Blood pressure (!) 150/102, pulse 77, temperature 98.7 F (37.1 C), temperature source Oral, resp. rate 16, height 5\' 7"  (1.702 m), weight 75 kg, SpO2 99 %.  Examination General: Well-developed, well-nourished male in no acute distress; appearance consistent with age of record HENT: normocephalic; atraumatic Eyes: pupils equal, round and reactive to light; extraocular muscles grossly intact Neck: supple Heart: regular rate and rhythm Lungs: clear to auscultation bilaterally Abdomen: soft; nondistended;  nontender;  bowel sounds present Extremities: No deformity; full range of motion; pulses normal Neurologic: Awake; alert; noted to move all extremities Skin: Warm and dry Psychiatric: Nonverbal; flat affect   RESULTS  Summary of this visit's results, reviewed and interpreted by myself:   EKG Interpretation  Date/Time:    Ventricular Rate:    PR Interval:    QRS Duration:   QT Interval:    QTC Calculation:   R Axis:     Text Interpretation:        Laboratory Studies: Results for orders placed or performed during the hospital encounter of 08/06/20 (from the past 24 hour(s))  Comprehensive metabolic panel     Status: Abnormal   Collection Time: 08/05/20 11:19 PM  Result Value Ref Range   Sodium 140 135 - 145 mmol/L   Potassium 4.3 3.5 - 5.1 mmol/L   Chloride 103 98 - 111 mmol/L   CO2 28 22 - 32 mmol/L   Glucose, Bld 113 (H) 70 - 99 mg/dL   BUN 14 6 - 20 mg/dL   Creatinine, Ser 08/07/20 0.61 - 1.24 mg/dL   Calcium 9.4 8.9 - 2.45 mg/dL   Total Protein 7.3 6.5 - 8.1 g/dL   Albumin 4.4 3.5 - 5.0 g/dL   AST 17 15 - 41 U/L   ALT 14 0 - 44 U/L   Alkaline Phosphatase 41 38 - 126 U/L   Total Bilirubin 0.7 0.3 - 1.2 mg/dL   GFR, Estimated 80.9 >98 mL/min  Anion gap 9 5 - 15  Ethanol     Status: None   Collection Time: 08/05/20 11:19 PM  Result Value Ref Range   Alcohol, Ethyl (B) <10 <10 mg/dL  Salicylate level     Status: Abnormal   Collection Time: 08/05/20 11:19 PM  Result Value Ref Range   Salicylate Lvl <7.0 (L) 7.0 - 30.0 mg/dL  Acetaminophen level     Status: Abnormal   Collection Time: 08/05/20 11:19 PM  Result Value Ref Range   Acetaminophen (Tylenol), Serum <10 (L) 10 - 30 ug/mL  cbc     Status: None   Collection Time: 08/05/20 11:19 PM  Result Value Ref Range   WBC 5.3 4.0 - 10.5 K/uL   RBC 4.52 4.22 - 5.81 MIL/uL   Hemoglobin 14.3 13.0 - 17.0 g/dL   HCT 27.5 39 - 52 %   MCV 92.7 80.0 - 100.0 fL   MCH 31.6 26.0 - 34.0 pg   MCHC 34.1 30.0 - 36.0 g/dL   RDW  17.0 01.7 - 49.4 %   Platelets 251 150 - 400 K/uL   nRBC 0.0 0.0 - 0.2 %  Respiratory Panel by RT PCR (Flu A&B, Covid) - Nasopharyngeal Swab     Status: None   Collection Time: 08/06/20  1:22 AM   Specimen: Nasopharyngeal Swab  Result Value Ref Range   SARS Coronavirus 2 by RT PCR NEGATIVE NEGATIVE   Influenza A by PCR NEGATIVE NEGATIVE   Influenza B by PCR NEGATIVE NEGATIVE  Rapid urine drug screen (hospital performed)     Status: Abnormal   Collection Time: 08/06/20  1:23 AM  Result Value Ref Range   Opiates NONE DETECTED NONE DETECTED   Cocaine NONE DETECTED NONE DETECTED   Benzodiazepines NONE DETECTED NONE DETECTED   Amphetamines NONE DETECTED NONE DETECTED   Tetrahydrocannabinol POSITIVE (A) NONE DETECTED   Barbiturates NONE DETECTED NONE DETECTED   Imaging Studies: No results found.  ED COURSE and MDM  Nursing notes, initial and subsequent vitals signs, including pulse oximetry, reviewed and interpreted by myself.  Vitals:   08/05/20 2309 08/05/20 2310 08/06/20 0259  BP:  (!) 150/102 139/82  Pulse:  77 67  Resp:  16 18  Temp:  98.7 F (37.1 C) 97.6 F (36.4 C)  TempSrc:  Oral Oral  SpO2:  99% 100%  Weight: 75 kg    Height: 5\' 7"  (1.702 m)     Medications - No data to display  1:15 AM Will consult TTS.  4:37 AM TTS has assessed patient and would like him transferred to the [facility].  PROCEDURES  Procedures   ED DIAGNOSES     ICD-10-CM   1. Schizophrenia, unspecified type (HCC)  F20.9   2. Noncompliance with medication regimen  Z91.14        Sister Carbone, [facility], MD 08/06/20 0116    08/08/20, MD 08/06/20 [phone]

## 2020-08-06 NOTE — ED Notes (Signed)
Pt sleeping in no acute distress. Safety maintained. 

## 2020-08-06 NOTE — ED Notes (Signed)
Belongings in locker 29. Bags from ED, not gone through at this time.

## 2020-08-06 NOTE — ED Notes (Signed)
Patient given lunch; sandwich, juice, cookies

## 2020-08-06 NOTE — ED Notes (Signed)
Pt belongings in nursing station cabinet labeled [id number]

## 2020-08-06 NOTE — ED Notes (Signed)
Pt refusing medications Invega & cogentin. NP [name] notified. Med time changed to 1000 in effort to allow pt to rest & potentially agree to take meds later in morning.

## 2020-10-06 ENCOUNTER — Other Ambulatory Visit: Payer: Self-pay

## 2020-10-06 ENCOUNTER — Encounter (HOSPITAL_COMMUNITY): Payer: Self-pay

## 2020-10-06 ENCOUNTER — Emergency Department (HOSPITAL_COMMUNITY)
Admission: EM | Admit: 2020-10-06 | Discharge: 2020-10-07 | Disposition: A | Discharge status: Home / Self Care | Payer: Medicaid - Out of State | Attending: Emergency Medicine | Admitting: Emergency Medicine

## 2020-10-06 ENCOUNTER — Emergency Department (HOSPITAL_COMMUNITY): Payer: Medicaid - Out of State

## 2020-10-06 DIAGNOSIS — U071 COVID-19: Secondary | ICD-10-CM | POA: Diagnosis not present

## 2020-10-06 DIAGNOSIS — F329 Major depressive disorder, single episode, unspecified: Secondary | ICD-10-CM | POA: Insufficient documentation

## 2020-10-06 DIAGNOSIS — R410 Disorientation, unspecified: Secondary | ICD-10-CM | POA: Diagnosis not present

## 2020-10-06 DIAGNOSIS — F209 Schizophrenia, unspecified: Secondary | ICD-10-CM

## 2020-10-06 DIAGNOSIS — R441 Visual hallucinations: Secondary | ICD-10-CM | POA: Diagnosis present

## 2020-10-06 DIAGNOSIS — F17210 Nicotine dependence, cigarettes, uncomplicated: Secondary | ICD-10-CM | POA: Insufficient documentation

## 2020-10-06 DIAGNOSIS — F2089 Other schizophrenia: Secondary | ICD-10-CM

## 2020-10-06 LAB — COMPREHENSIVE METABOLIC PANEL
ALT: 15 U/L (ref 0–44)
AST: 21 U/L (ref 15–41)
Albumin: 4.7 g/dL (ref 3.5–5.0)
Alkaline Phosphatase: 39 U/L (ref 38–126)
Anion gap: 11 (ref 5–15)
BUN: 13 mg/dL (ref 6–20)
CO2: 26 mmol/L (ref 22–32)
Calcium: 9.6 mg/dL (ref 8.9–10.3)
Chloride: 102 mmol/L (ref 98–111)
Creatinine, Ser: 1.01 mg/dL (ref 0.61–1.24)
GFR, Estimated: >60 mL/min (ref >60)
Glucose, Bld: 81 mg/dL (ref 70–99)
Potassium: 4 mmol/L (ref 3.5–5.1)
Sodium: 139 mmol/L (ref 135–145)
Total Bilirubin: 0.7 mg/dL (ref 0.3–1.2)
Total Protein: 7.6 g/dL (ref 6.5–8.1)

## 2020-10-06 LAB — CBC WITH DIFFERENTIAL/PLATELET
Abs Immature Granulocytes: 0.01 10*3/uL (ref 0.00–0.07)
Basophils Absolute: 0.1 10*3/uL (ref 0.0–0.1)
Basophils Relative: 1 %
Eosinophils Absolute: 0.4 10*3/uL (ref 0.0–0.5)
Eosinophils Relative: 4 %
HCT: 49.3 % (ref 39.0–52.0)
Hemoglobin: 16.6 g/dL (ref 13.0–17.0)
Immature Granulocytes: 0 %
Lymphocytes Relative: 30 %
Lymphs Abs: 2.4 10*3/uL (ref 0.7–4.0)
MCH: 31 pg (ref 26.0–34.0)
MCHC: 33.7 g/dL (ref 30.0–36.0)
MCV: 92 fL (ref 80.0–100.0)
Monocytes Absolute: 0.7 10*3/uL (ref 0.1–1.0)
Monocytes Relative: 8 %
Neutro Abs: 4.6 10*3/uL (ref 1.7–7.7)
Neutrophils Relative %: 57 %
Platelets: 256 10*3/uL (ref 150–400)
RBC: 5.36 MIL/uL (ref 4.22–5.81)
RDW: 12.7 % (ref 11.5–15.5)
WBC: 8 10*3/uL (ref 4.0–10.5)
nRBC: 0 % (ref 0.0–0.2)

## 2020-10-06 LAB — ETHANOL: Alcohol, Ethyl (B): <10 mg/dL (ref <10)

## 2020-10-06 LAB — RESP PANEL BY RT-PCR (FLU A&B, COVID) ARPGX2
Influenza A by PCR: NEGATIVE
Influenza B by PCR: NEGATIVE
SARS Coronavirus 2 by RT PCR: POSITIVE — AB

## 2020-10-06 MED ORDER — ALBUTEROL SULFATE HFA 108 (90 BASE) MCG/ACT IN AERS
2.0000 | INHALATION_SPRAY | Freq: Once | RESPIRATORY_TRACT | Status: AC
  Administered 2020-10-06: 2 via RESPIRATORY_TRACT
  Filled 2020-10-06: qty 6.7

## 2020-10-06 MED ORDER — ACETAMINOPHEN 325 MG PO TABS: 650.0000 mg | ORAL_TABLET | ORAL | Status: DC | PRN

## 2020-10-06 MED ORDER — OLANZAPINE 5 MG PO TBDP: 5.0000 mg | ORAL_TABLET | Freq: Three times a day (TID) | ORAL | Status: DC | PRN

## 2020-10-06 MED ORDER — ZOLPIDEM TARTRATE 5 MG PO TABS: 5.0000 mg | ORAL_TABLET | Freq: Every evening | ORAL | Status: DC | PRN

## 2020-10-06 MED ORDER — ONDANSETRON HCL 4 MG PO TABS: 4.0000 mg | ORAL_TABLET | Freq: Three times a day (TID) | ORAL | Status: DC | PRN

## 2020-10-06 MED ORDER — ZIPRASIDONE MESYLATE 20 MG IM SOLR: 20.0000 mg | INTRAMUSCULAR | Status: DC | PRN

## 2020-10-06 MED ORDER — LORAZEPAM 1 MG PO TABS: 1.0000 mg | ORAL_TABLET | ORAL | Status: DC | PRN

## 2020-10-06 NOTE — BH Assessment (Addendum)
Per Melbourne Abts, PA pt meets inpatient criteria. [facility] AC contacted with no beds available at time of assessment. Melbourne Abts, PA pt meets inpatient criteria. [facility] AC contacted with no beds available at time of assessment.    Comprehensive Clinical Assessment (CCA) Note  10/06/2020 [name] [id number]  Chief Complaint:  Chief Complaint  Patient presents with  . Psychiatric Evaluation   Visit Diagnosis:   Schizophrenia  [name] is a 29yo male transported to [facility] via EMS due to experiencing visual hallucinations at home. Pt is unable to articulate why he is at the hospital, current month, or current year. Pt reports that he does not have SI, no HI, not experiencing AVH and is not under the influence of alcohol or drugs at time of assessment. Pt came to hospital willingly to get psychiatric stabilization.Pts affective and emotional state appeared withdrawn, flat. Pts mental state included poor insight and observing capacity with impaired judgement.  Pt is unsure whether he is a danger to himself or others.   Current Symptoms/Problems: confusion; inability to articulate thoughts   Patient Reported Schizophrenia/Schizoaffective Diagnosis in Past: Yes [name], MSW, LCSW Outpatient Therapist/Triage Specialist    CCA Screening, Triage and Referral (STR)  Patient Reported Information How did you hear about [place]? Family/Friend  Referral name: EMS transported pt to [facility]  Referral phone number: No data recorded  Whom do you see for routine medical problems? I don't have a doctor  Practice/Facility Name: No data recorded Practice/Facility Phone Number: No data recorded Name of Contact: No data recorded Contact Number: No data recorded Contact Fax Number: No data recorded Prescriber Name: No data recorded Prescriber Address (if known): No data recorded  What Is the Reason for Your Visit/Call Today? schizophrenia  How Long Has This Been Causing You Problems? > than 6  months  What Do You Feel Would Help You the Most Today? Assessment Only; Medication; Therapy   Have You Recently Been in Any Inpatient Treatment (Hospital/Detox/Crisis Center/28-Day Program)? No  Name/Location of Program/Hospital:[facility]  How Long Were You There? Per chart, "2 days."  When Were You Discharged? 02/24/2020 (Per chart.)   Have You Ever Received Services From [facility] Before? Yes  Who Do You See at [facility]? ED and [facility]   Have You Recently Had Any Thoughts About Hurting Yourself? No  Are You Planning to Commit Suicide/Harm Yourself At This time? No   Have you Recently Had Thoughts About Hurting Someone [name]? No  Explanation: No data recorded  Have You Used Any Alcohol or Drugs in the Past 24 Hours? -- (UDS pending)  How Long Ago Did You Use Drugs or Alcohol? 0000 (UTA)  What Did You Use and How Much? Pt reported, smoking marijuana everyday.   Do You Currently Have a Therapist/Psychiatrist? No  Name of Therapist/Psychiatrist: Per chart, pt has an ACT Team however pt currently denies.   Have You Been Recently Discharged From Any Office Practice or Programs? No  Explanation of Discharge From Practice/Program: No data recorded    CCA Screening Triage Referral Assessment Type of Contact: Tele-Assessment  Is this Initial or Reassessment? Initial Assessment  Date Telepsych consult ordered in [facility]:  10/06/2020  Time Telepsych consult ordered in [facility]:  1751   Patient Reported Information Reviewed? Yes  Patient Left Without Being Seen? No data recorded Reason for Not Completing Assessment: TTS cart currently in use, 2 pt's ahead of this consult. TTS to assess pt when cart is available    Collateral Involvement: Pt declined  for clinician to contact collaterals.   Does Patient Have a Automotive engineer Guardian? No data recorded Name and Contact of Legal Guardian: [name], legal guardian.   If Minor and Not Living with Parent(s), Who has  Custody? NA  Is CPS involved or ever been involved? Never  Is APS involved or ever been involved? Never   Patient Determined To Be At Risk for Harm To Self or Others Based on Review of Patient Reported Information or Presenting Complaint? No  Method: No data recorded Availability of Means: No data recorded Intent: No data recorded Notification Required: No data recorded Additional Information for Danger to Others Potential: No data recorded Additional Comments for Danger to Others Potential: No data recorded Are There Guns or Other Weapons in Your Home? No data recorded Types of Guns/Weapons: No data recorded Are These Weapons Safely Secured?                            No data recorded Who Could Verify You Are Able To Have These Secured: No data recorded Do You Have any Outstanding Charges, Pending Court Dates, Parole/Probation? No data recorded Contacted To Inform of Risk of Harm To Self or Others: Unable to Contact:   [facility]: [facility]   Does Patient Present under Involuntary Commitment? No  IVC Papers Initial File Date: No data recorded  [place] of Residence: [place]   Patient Currently Receiving the Following Services: Not Receiving Services   Determination of Need: Emergent (2 hours)   Options For Referral: Inpatient Hospitalization     CCA Biopsychosocial Intake/Chief Complaint:  [name] is a 29yo male transported to [facility] via EMS due to experiencing visual hallucinations at home. Pt is unable to articulate why he is at the hospital, current month, or current year. Pt reports that he does not have SI, no HI, not experiencing AVH and is not under the influence of alcohol or drugs at time of assessment. Pt came to hospital willingly to get psychiatric stabilization.  Current Symptoms/Problems: confusion; inability to articulate thoughts   Patient Reported Schizophrenia/Schizoaffective Diagnosis in Past: Yes   Strengths: UTA  Preferences:  UTA  Abilities: No data recorded  Type of Services Patient Feels are Needed: psychiatric support   Initial Clinical Notes/Concerns: Pt is a poor historian   Mental Health Symptoms Depression:  Difficulty Concentrating   Duration of Depressive symptoms: Greater than two weeks   Mania:  Racing thoughts   Anxiety:   Difficulty concentrating   Psychosis:  Affective flattening/alogia/avolition; Hallucinations (pt denies hallucinations but family members stated he was experiencing visual hallucinations prior to EMS transport to hospital)   Duration of Psychotic symptoms: Greater than six months   Trauma:  None   Obsessions:  None   Compulsions:  None   Inattention:  None   Hyperactivity/Impulsivity:  N/A   Oppositional/Defiant Behaviors:  N/A   Emotional Irregularity:  N/A   Other Mood/Personality Symptoms:  No data recorded   Mental Status Exam Appearance and self-care  Stature:  Average   Weight:  Average weight   Clothing:  Casual   Grooming:  Normal   Cosmetic use:  None   Posture/gait:  Normal   Motor activity:  Not Remarkable   Sensorium  Attention:  Distractible   Concentration:  Scattered   Orientation:  Person; Place (pt unaware of month, year)   Recall/memory:  Defective in Remote; Defective in Recent; Defective in Immediate; Defective in Short-term  Affect and Mood  Affect:  Depressed; Flat   Mood:  Depressed   Relating  Eye contact:  Staring (Poor.)   Facial expression:  Constricted; Depressed   Attitude toward examiner:  Guarded; Resistant   Thought and Language  Speech flow: Blocked   Thought content:  -- (UTA)   Preoccupation:  None   Hallucinations:  Other (Comment) (Pt denies.)   Organization:  No data recorded  [employer] of Knowledge:  Poor   Intelligence:  Average   Abstraction:  -- (UTA)   Judgement:  Impaired   Reality Testing:  Variable (UTA)   Insight:  Gaps   Decision Making:   Confused   Social Functioning  Social Maturity:  Isolates Industrial/product designer)   Social Judgement:  -- Industrial/product designer)   Stress  Stressors:  Housing   Coping Ability:  Exhausted Industrial/product designer)   Skill Deficits:  Decision making   Supports:  Support needed     Religion: Religion/Spirituality Are You A Religious Person?:  Industrial/product designer)  Leisure/Recreation: Leisure / Recreation Do You Have Hobbies?:  (UTA)  Exercise/Diet: Exercise/Diet Do You Exercise?:  (UTA) Do You Follow a Special Diet?: No Do You Have Any Trouble Sleeping?:  (UTA)   CCA Employment/Education Employment/Work Situation: Employment / Work Situation Employment situation:  Industrial/product designer) Patient's job has been impacted by current illness:  (UTA) What is the longest time patient has a held a job?: UTA 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 Has patient ever been in the [employer]?: No (Per chart.)  Education: Education Last Grade Completed:  (Per chart, 9th or 10th grade.) Name of High School: UTA Did Garment/textile technologist From [employer]?: No (Per chart.) Did You Attend College?: No (Per chart.) Did You Attend Graduate School?: No (Per chart.) Did You Have Any Special Interests In School?: UTA Did You Have An Individualized Education Program (IIEP):  (UTA) Did You Have Any Difficulty At School?:  (UTA)   CCA Family/Childhood History Family and Relationship History: Family history Are you sexually active?:  (UTA) What is your sexual orientation?: UTA Has your sexual activity been affected by drugs, alcohol, medication, or emotional stress?: UTA Does patient have children?:  (UTA)  Childhood History:  Childhood History By whom was/is the patient raised?:  (UTA) Additional childhood history information: UTA Description of patient's relationship with caregiver when they were a child: UTA How were you disciplined when you got in trouble as a child/adolescent?: UTA Did patient suffer any verbal/emotional/physical/sexual abuse as a child?:   (UTA) Has patient ever been sexually abused/assaulted/raped as an adolescent or adult?:  (UTA.) Witnessed domestic violence?:  (UTA) Has patient been affected by domestic violence as an adult?:  Industrial/product designer)  Child/Adolescent Assessment:     CCA Substance Use Alcohol/Drug Use: Alcohol / Drug Use Pain Medications: See MAR Prescriptions: See MAR Over the Counter: See MAR History of alcohol / drug use?: Yes Longest period of sobriety (when/how long): Unknown Negative Consequences of Use: Personal relationships Withdrawal Symptoms:  (Denies)                         ASAM's:  Six Dimensions of Multidimensional Assessment  Dimension 1:  Acute Intoxication and/or Withdrawal Potential:      Dimension 2:  Biomedical Conditions and Complications:      Dimension 3:  Emotional, Behavioral, or Cognitive Conditions and Complications:     Dimension 4:  Readiness to Change:     Dimension 5:  Relapse, Continued  use, or Continued Problem Potential:     Dimension 6:  Recovery/Living Environment:     ASAM Severity Score:    ASAM Recommended Level of Treatment:     Substance use Disorder (SUD)    Recommendations for Services/Supports/Treatments: Recommendations for Services/Supports/Treatments Recommendations For Services/Supports/Treatments: Inpatient Hospitalization (Continuing Observation Unit.)  DSM5 Diagnoses: Patient Active Problem List   Diagnosis Date Noted  . Schizophrenia, paranoid type (HCC) 02/14/2018  . Cannabis use disorder, moderate, dependence (HCC) 05/10/2016    Referrals to Alternative Service(s): Referred to Alternative Service(s):   Place:   Date:   Time:    Referred to Alternative Service(s):   Place:   Date:   Time:    Referred to Alternative Service(s):   Place:   Date:   Time:    Referred to Alternative Service(s):   Place:   Date:   Time:     Ernest HaberChristina R Lakiya Cottam, LCSW

## 2020-10-06 NOTE — ED Provider Notes (Addendum)
[facility]-EMERGENCY DEPT Provider Note   CSN: [id number] Arrival date & time: 10/06/20  1605     History Chief Complaint  Patient presents with  . Psychiatric Evaluation    [name] is a 29 y.o. male.  Presented to ER with concern for increased visual hallucinations, confusion.  Patient states that he does not have any new concerns.  States he has a sense of wheezing in his chest.  This has been going on for a long time.  History limited due to minimal cooperation.  Additional history obtained from EMS report, attempted to contact grandmother listed as emergency contact but no answer.  HPI     Past Medical History:  Diagnosis Date  . Depression   . Schizophrenia [facility])     Patient Active Problem List   Diagnosis Date Noted  . Other schizophrenia (HCC) 02/14/2018  . Cannabis use disorder, moderate, dependence (HCC) 05/10/2016    Past Surgical History:  Procedure Laterality Date  . BACK SURGERY         History reviewed. No pertinent family history.  Social History   Tobacco Use  . Smoking status: Current Some Day Smoker    Types: Cigarettes  . Smokeless tobacco: Never Used  Vaping Use  . Vaping Use: Never used  Substance Use Topics  . Alcohol use: No  . Drug use: Yes    Types: Marijuana    Comment: rare    Home Medications Prior to Admission medications   Not on File    Allergies    Bee pollen and Shellfish allergy  Review of Systems   Review of Systems  Unable to perform ROS: Mental status change    Physical Exam Updated Vital Signs BP (!) 138/96   Pulse 89   Temp 99 F (37.2 C) (Oral)   Resp 16   SpO2 96%   Physical Exam Vitals and nursing note reviewed.  Constitutional:      Appearance: He is well-developed and well-nourished.  HENT:     Head: Normocephalic and atraumatic.  Eyes:     Conjunctiva/sclera: Conjunctivae normal.  Cardiovascular:     Rate and Rhythm: Normal rate and regular rhythm.      Heart sounds: No murmur heard.   Pulmonary:     Effort: Pulmonary effort is normal. No respiratory distress.     Breath sounds: Normal breath sounds.     Comments: Slight inspiratory wheeze over right chest, no tachypnea, no increased work of breathing Abdominal:     Palpations: Abdomen is soft.     Tenderness: There is no abdominal tenderness.  Musculoskeletal:        General: No deformity, signs of injury or edema.     Cervical back: Neck supple.  Skin:    General: Skin is warm and dry.     Capillary Refill: Capillary refill takes less than 2 seconds.  Neurological:     General: No focal deficit present.     Mental Status: He is alert.  Psychiatric:        Mood and Affect: Mood and affect normal.     Comments: Flattened affect     ED Results / Procedures / Treatments   Labs (all labs ordered are listed, but only abnormal results are displayed) Labs Reviewed  RESP PANEL BY RT-PCR (FLU A&B, COVID) ARPGX2 - Abnormal; Notable for the following components:      Result Value   SARS Coronavirus 2 by RT PCR POSITIVE (*)  All other components within normal limits  COMPREHENSIVE METABOLIC PANEL  ETHANOL  CBC WITH DIFFERENTIAL/PLATELET  RAPID URINE DRUG SCREEN, HOSP PERFORMED    EKG EKG Interpretation  Date/Time:  Wednesday October 06 2020 20:46:53 EST Ventricular Rate:  87 PR Interval:    QRS Duration: 82 QT Interval:  360 QTC Calculation: 433 R Axis:   85 Text Interpretation: Sinus rhythm Benign early repolarization Confirmed by Marianna Fuss ([id number]) on 10/06/2020 8:49:36 PM   Radiology DG Chest Portable 1 View  Result Date: 10/06/2020 CLINICAL DATA:  Shortness of breath and wheezing.  Confusion. EXAM: PORTABLE CHEST 1 VIEW COMPARISON:  07/24/2018 FINDINGS: Hyperinflation. Normal heart size and pulmonary vascularity. No focal airspace disease or consolidation in the lungs. No blunting of costophrenic angles. No pneumothorax. Mediastinal contours appear intact.  IMPRESSION: No active disease. Electronically Signed   By: Burman Nieves M.D.   On: 10/06/2020 18:08    Procedures Procedures (including critical care time)  Medications Ordered in ED Medications  albuterol (VENTOLIN HFA) 108 (90 Base) MCG/ACT inhaler 2 puff (2 puffs Inhalation Given 10/06/20 1840)    ED Course  I have reviewed the triage vital signs and the nursing notes.  Pertinent labs & imaging results that were available during my care of the patient were reviewed by me and considered in my medical decision making (see chart for details).    MDM Rules/Calculators/A&P                         29 year old male presented to ER for psychiatric assessment based on request of family.  Patient does not have any acute complaints except for slight wheezing.  From a medical standpoint, he is well-appearing, no distress with normal vital signs.  Noted very faint wheeze on exam.  Provided inhaler and checked chest x-ray and Covid test.  CXR negative.  Covid is positive.  He is medically stable for psychiatric assessment.  TTS evaluated and recommended inpatient management.  We will place in psych hold while awaiting placement.   Final Clinical Impression(s) / ED Diagnoses Final diagnoses:  Schizophrenia, unspecified type (HCC)  COVID-19    Rx / DC Orders ED Discharge Orders    None       Milagros Loll, MD 10/06/20 2217    Milagros Loll, MD 10/06/20 2218

## 2020-10-06 NOTE — ED Notes (Signed)
TTS in progress 

## 2020-10-06 NOTE — ED Triage Notes (Signed)
Pt BIB EMS from home. Pt seems to be having visual hallucinations and is confused. Pt has been cooperative and not combative. Pt is here voluntarily to get help with psychiatric problems.

## 2020-10-06 NOTE — BH Assessment (Addendum)
Per Melbourne Abts, PA pt meets inpatient criteria. [facility] AC contacted with no beds available at time of assessment.

## 2020-10-07 DIAGNOSIS — F2089 Other schizophrenia: Secondary | ICD-10-CM

## 2020-10-07 LAB — RAPID URINE DRUG SCREEN, HOSP PERFORMED
Amphetamines: NOT DETECTED
Barbiturates: NOT DETECTED
Benzodiazepines: NOT DETECTED
Cocaine: NOT DETECTED
Opiates: NOT DETECTED
Tetrahydrocannabinol: NOT DETECTED

## 2020-10-07 MED ORDER — PALIPERIDONE PALMITATE ER 156 MG/ML IM SUSY
156.0000 mg | PREFILLED_SYRINGE | Freq: Once | INTRAMUSCULAR | Status: DC
  Filled 2020-10-07: qty 1

## 2020-10-07 NOTE — Progress Notes (Signed)
Reviewed patient for possible transfer to [facility]. Patient tested positive for COVID 10/06/20, therefore, not appropriate for continuous assessment area at [facility].

## 2020-10-07 NOTE — Progress Notes (Signed)
..   Transition of Care [facility]) - Emergency Department Mini Assessment   Patient Details  Name: [name] MRN: [id number] Date of Birth: 1991/08/27  Transition of Care [facility]) CM/SW Contact:    Simcha Farrington C Tarpley-Carter, LCSWA Phone Number: 10/07/2020, 11:30 AM   Clinical Narrative: [facility] CM/CSW consulted with pt in regards to transportation back to [facility].    Virl Coble Tarpley-Carter, MSW, LCSW-A Pronouns:  She, Her, Hers                  [facility] ED Transitions of CareClinical Social Worker [email or web address] [phone]   ED Mini Assessment:    Barriers to Discharge: No Barriers Identified     Means of departure: Not know  Interventions which prevented an admission or readmission: Transportation Screening    Patient Contact and Communications       Contact Date: 10/07/20,       Call outcome: Pt is COVID positive and needs transportation to [employer].  Patient states their goals for this hospitalization and ongoing recovery are:: Pt would like to obtain transportation back to [facility].      Admission diagnosis:  psych evaluation Patient Active Problem List   Diagnosis Date Noted  . Other schizophrenia (HCC) 02/14/2018  . Cannabis use disorder, moderate, dependence (HCC) 05/10/2016   PCP:  Jackie Plum, MD Pharmacy:   [facility] [phone] - [place], [place] - [address] AT [facility] [address] [place] [address] Phone: [phone] Fax: [phone]

## 2020-10-07 NOTE — ED Notes (Signed)
TTS via tele machine in process.

## 2020-10-07 NOTE — ED Notes (Signed)
Attempted to [facility] manager. Reported that it could take until 1500 today before anyone was available. Charge Nurse notified.

## 2020-10-07 NOTE — Progress Notes (Signed)
TOC CM/CSW consulted with pt about transportation back to [facility].  Pt presented "Rider's Waiver".  Pt gave CSW verbal permission to sign waiver.  Transportation was called.  [employer]ider Waiver was faxed.  CSW will continue to follow for dc needs.  Tomika Eckles Tarpley-Carter, MSW, LCSW-A Pronouns:  She, Her, Hers                  [facility] Transitions of CareClinical Social Worker [email or web address] [phone]

## 2020-10-07 NOTE — Consult Note (Addendum)
[facility]  TelePsych ED Discharge  10/07/2020 2:00 PM [name]  MRN:  [id number] Principal Problem: Other schizophrenia [facility]) Discharge Diagnoses: Principal Problem:   Other schizophrenia (HCC)   Subjective: Patient states "I felt like I needed medication to help me focus on reality, I would like a chill pill, they make me feel great."  Patient reports readiness to discharge, states he would like to return to his motel room.  Patient reports he resides in [place] in a hotel.  Patient denies access to weapons.  Patient reports he is currently not employed but seeking employment.  Patient denies both alcohol and substance use.  Patient has a history of paranoid schizophrenia.  Patient is followed outpatient by [facility] act services.  Patient reports he does not like medications that are injectable as "they do not make me feel right."  Patient reports he is compliant with outpatient psychiatric treatment through [facility] act team.  Patient assessed by nurse practitioner.  Patient alert and oriented, answers appropriately.  Patient pleasant cooperative during assessment.  Patient denies suicidal and homicidal ideation.  There is no indication that patient is responding to internal stimuli and no evidence of delusional thought content.  Patient does endorse paranoia.  Patient reports he would like to return to the motel room as he feels increasing paranoia in the emergency department setting.  Patient endorses average sleep and appetite.  Patient offered support and encouragement.  Discussed and educated patient regarding importance of quarantine x2 weeks, patient verbalizes understanding.  Patient gives verbal consent to speak with his act team provider, [employer] act.  Spoke with patient's outpatient provider at [facility], registered nurse, Annmarie.  Registered nurse reports patient is working through social work at [employer] to transition into permanent housing.  Per RN patient is currently provided a  hotel room through [place] act at [employer].  [facility] act team reports plan to follow-up with patient, [facility] Mixer asked requests that patient be given scheduled Invega 156 mg IM.  Per [facility] act patient has refused this medication x2 months however they are continuing to follow-up with patient at hotel.  Total Time spent with patient: 30 minutes  Past Psychiatric History: Paranoid schizophrenia  Past Medical History:  Past Medical History:  Diagnosis Date  . Depression   . Schizophrenia [facility])     Past Surgical History:  Procedure Laterality Date  . BACK SURGERY     Family History: History reviewed. No pertinent family history. Family Psychiatric  History: None reported Social History:  Social History   Substance and Sexual Activity  Alcohol Use No     Social History   Substance and Sexual Activity  Drug Use Yes  . Types: Marijuana   Comment: rare    Social History   Socioeconomic History  . Marital status: Single    Spouse name: Not on file  . Number of children: Not on file  . Years of education: Not on file  . Highest education level: Not on file  Occupational History  . Occupation: unemployed  Tobacco Use  . Smoking status: Current Some Day Smoker    Types: Cigarettes  . Smokeless tobacco: Never Used  Vaping Use  . Vaping Use: Never used  Substance and Sexual Activity  . Alcohol use: No  . Drug use: Yes    Types: Marijuana    Comment: rare  . Sexual activity: Yes    Birth control/protection: Condom  Other Topics Concern  . Not on file  Social History Narrative  UTA certain topics - tangential thought process, cannot answer clearly. Pt not sure about school; reportedly is homeless.    Social Determinants of Health   Financial Resource Strain: Not on file  Food Insecurity: Not on file  Transportation Needs: Not on file  Physical Activity: Not on file  Stress: Not on file  Social Connections: Not on file    Has this patient used any form  of tobacco in the last 30 days? (Cigarettes, Smokeless Tobacco, Cigars, and/or Pipes) A prescription for an FDA-approved tobacco cessation medication was offered at discharge and the patient refused  Current Medications: Current Facility-Administered Medications  Medication Dose Route Frequency Provider Last Rate Last Admin  . acetaminophen (TYLENOL) tablet 650 mg  650 mg Oral Q4H PRN Milagros Loll, MD      . OLANZapine zydis (ZYPREXA) disintegrating tablet 5 mg  5 mg Oral Q8H PRN Milagros Loll, MD       And  . LORazepam (ATIVAN) tablet 1 mg  1 mg Oral PRN Milagros Loll, MD       And  . ziprasidone (GEODON) injection 20 mg  20 mg Intramuscular PRN Milagros Loll, MD      . ondansetron [facility]) tablet 4 mg  4 mg Oral Q8H PRN Milagros Loll, MD      . paliperidone (INVEGA SUSTENNA) injection 156 mg  156 mg Intramuscular Once Patrcia Dolly, FNP      . zolpidem (AMBIEN) tablet 5 mg  5 mg Oral QHS PRN Milagros Loll, MD       No current outpatient medications on file.   PTA Medications: (Not in a hospital admission)   Musculoskeletal: Strength & Muscle Tone: within normal limits Gait & Station: normal Patient leans: N/A  Psychiatric Specialty Exam: Physical Exam Vitals and nursing note reviewed.  Constitutional:      Appearance: He is well-developed.  HENT:     Head: Normocephalic.  Cardiovascular:     Rate and Rhythm: Normal rate.  Pulmonary:     Effort: Pulmonary effort is normal.  Neurological:     Mental Status: He is alert and oriented to person, place, and time.  Psychiatric:        Attention and Perception: Attention and perception normal.        Mood and Affect: Mood and affect normal.        Speech: Speech normal.        Behavior: Behavior normal. Behavior is cooperative.        Thought Content: Thought content is paranoid.        Cognition and Memory: Cognition and memory normal.        Judgment: Judgment normal.     Review of Systems   Constitutional: Negative.   HENT: Negative.   Eyes: Negative.   Respiratory: Negative.   Cardiovascular: Negative.   Gastrointestinal: Negative.   Genitourinary: Negative.   Musculoskeletal: Negative.   Skin: Negative.   Neurological: Negative.     Blood pressure 129/83, pulse (!) 104, temperature 98.7 F (37.1 C), temperature source Oral, resp. rate 16, SpO2 97 %.There is no height or weight on file to calculate BMI.  General Appearance: Casual and Fairly Groomed  Eye Contact:  Good  Speech:  Clear and Coherent and Normal Rate  Volume:  Normal  Mood:  Anxious  Affect:  Appropriate and Congruent  Thought Process:  Coherent, Goal Directed and Descriptions of Associations: Intact  Orientation:  Full (Time, Place, and Person)  Thought Content:  WDL and Logical  Suicidal Thoughts:  No  Homicidal Thoughts:  No  Memory:  Immediate;   Fair Recent;   Fair Remote;   Fair  Judgement:  Fair  Insight:  Fair  Psychomotor Activity:  Normal  Concentration:  Concentration: Fair and Attention Span: Fair  Recall:  [employer] of Knowledge:  Fair  Language:  Fair  Akathisia:  No  Handed:  Right  AIMS (if indicated):     Assets:  Communication Skills Desire for Improvement Financial Resources/Insurance Housing Intimacy Leisure Time Physical Health Resilience Social Support  ADL's:  Intact  Cognition:  WNL  Sleep:        Demographic Factors:  Male  Loss Factors: NA  Historical Factors: NA  Risk Reduction Factors:   Positive social support, Positive therapeutic relationship and Positive coping skills or problem solving skills  Continued Clinical Symptoms:  Schizophrenia:   Paranoid or undifferentiated type  Cognitive Features That Contribute To Risk:  None    Suicide Risk:  Minimal: No identifiable suicidal ideation.  Patients presenting with no risk factors but with morbid ruminations; may be classified as minimal risk based on the severity of the depressive  symptoms    Plan Of Care/Follow-up recommendations:  Other:  Patient reviewed with Dr. Nelly Rout. Follow-up with outpatient psychiatry, [facility] act team.  Disposition: Patient cleared by psychiatry. Patrcia Dolly, FNP 10/07/2020, 2:00 PM   Patient was at [facility] ED, was seen via televideo Psychiatry from [facility] center. Patient seen via tele video by Berneice Heinrich , APP

## 2020-10-07 NOTE — ED Notes (Addendum)
Pt refusing to keep door closed and states he is still unable to provide urine specimen.

## 2020-10-07 NOTE — ED Notes (Signed)
Attempted to obtain urine sample from pt. He states he can not urinate at this time. Advised pt of in and out cath. Pt stated in many colorful words that was not about to happen

## 2020-10-07 NOTE — Progress Notes (Signed)
TOC CM/CSW was contacted by [employer] that driver left after 57 minute wait.  Transportation will not have an available COVID driver until 3pm.  CSW will continue to follow for dc needs.  Jalyssa Fleisher Tarpley-Carter, MSW, LCSW-A Pronouns:  She, Her, Hers                  [facility] ED Transitions of CareClinical Social Worker [email or web address] [phone]

## 2020-10-07 NOTE — Discharge Instructions (Addendum)
For your behavioral health needs, you are advised to continue treatment with the [facility] ACT Team:       [facility]      [address]      [place], [place] [address]      Office: [phone]      Crisis: [phone]

## 2020-10-07 NOTE — ED Provider Notes (Addendum)
Emergency Medicine Observation Re-evaluation Note  [name] is a 29 y.o. male, seen on rounds today.  Pt initially presented to the ED for complaints of Psychiatric Evaluation Currently, the patient is awaiting placement, in isolation due to Covid  Physical Exam  BP 119/72 (BP Location: Right Arm)   Pulse 85   Temp 99 F (37.2 C) (Oral)   Resp 16   SpO2 100%  Physical Exam General: Sleeping, arouses to door opening Psych: Calm and cooperative  ED Course / MDM  EKG:EKG Interpretation  Date/Time:  Wednesday October 06 2020 20:46:53 EST Ventricular Rate:  87 PR Interval:    QRS Duration: 82 QT Interval:  360 QTC Calculation: 433 R Axis:   85 Text Interpretation: Sinus rhythm Benign early repolarization Confirmed by Marianna Fuss ([id number]) on 10/06/2020 8:49:36 PM    I have reviewed the labs performed to date as well as medications administered while in observation.  Recent changes in the last 24 hours include none.  Plan  Current plan is for Inpatient psych placement complicated by Covid infection. Patient is not under full IVC at this time.    11:42 AM Patient re-evaluated by Psych, will be given Invega injection and cleared for discharge. Covid quarantine information given.    Pollyann Savoy, MD 10/07/20 [phone]

## 2020-10-07 NOTE — BH Assessment (Incomplete)
Per Nelly Rout, MD, this voluntary pt does not require psychiatric hospitalization at this time.  Pt is psychiatrically cleared.  Per Berneice Heinrich, NP, pt is to receive Gean Birchwood injection.  EDP Susy Frizzle, MD will then be advised to discharge pt.  Discharge instructions include advise pt to continue treatment with the [facility] ACT Team.  A TOC consult will be ordered to facilitate pt's return home.  Dr Bernette Mayers and pt's nurse, Silvio Pate, have been notified.  Doylene Canning, MA Triage Specialist [phone]

## 2020-10-22 ENCOUNTER — Emergency Department (HOSPITAL_COMMUNITY)
Admission: EM | Admit: 2020-10-22 | Discharge: 2020-10-24 | Disposition: A | Discharge status: Home / Self Care | Payer: Medicaid - Out of State | Attending: Emergency Medicine | Admitting: Emergency Medicine

## 2020-10-22 ENCOUNTER — Other Ambulatory Visit: Payer: Self-pay

## 2020-10-22 DIAGNOSIS — F22 Delusional disorders: Secondary | ICD-10-CM

## 2020-10-22 DIAGNOSIS — F1290 Cannabis use, unspecified, uncomplicated: Secondary | ICD-10-CM | POA: Insufficient documentation

## 2020-10-22 DIAGNOSIS — U071 COVID-19: Secondary | ICD-10-CM | POA: Insufficient documentation

## 2020-10-22 DIAGNOSIS — Z046 Encounter for general psychiatric examination, requested by authority: Secondary | ICD-10-CM | POA: Diagnosis not present

## 2020-10-22 DIAGNOSIS — F200 Paranoid schizophrenia: Secondary | ICD-10-CM | POA: Diagnosis not present

## 2020-10-22 DIAGNOSIS — F209 Schizophrenia, unspecified: Secondary | ICD-10-CM | POA: Insufficient documentation

## 2020-10-22 DIAGNOSIS — F600 Paranoid personality disorder: Secondary | ICD-10-CM | POA: Diagnosis present

## 2020-10-22 LAB — COMPREHENSIVE METABOLIC PANEL
ALT: 14 U/L (ref 0–44)
AST: 21 U/L (ref 15–41)
Albumin: 4.5 g/dL (ref 3.5–5.0)
Alkaline Phosphatase: 42 U/L (ref 38–126)
Anion gap: 12 (ref 5–15)
BUN: 12 mg/dL (ref 6–20)
CO2: 25 mmol/L (ref 22–32)
Calcium: 9.6 mg/dL (ref 8.9–10.3)
Chloride: 103 mmol/L (ref 98–111)
Creatinine, Ser: 1.17 mg/dL (ref 0.61–1.24)
GFR, Estimated: >60 mL/min (ref >60)
Glucose, Bld: 93 mg/dL (ref 70–99)
Potassium: 3.9 mmol/L (ref 3.5–5.1)
Sodium: 140 mmol/L (ref 135–145)
Total Bilirubin: 0.8 mg/dL (ref 0.3–1.2)
Total Protein: 7.1 g/dL (ref 6.5–8.1)

## 2020-10-22 LAB — ACETAMINOPHEN LEVEL: Acetaminophen (Tylenol), Serum: <10 ug/mL — ABNORMAL LOW (ref 10–30)

## 2020-10-22 LAB — ETHANOL: Alcohol, Ethyl (B): <10 mg/dL (ref <10)

## 2020-10-22 LAB — RAPID URINE DRUG SCREEN, HOSP PERFORMED
Amphetamines: NOT DETECTED
Barbiturates: NOT DETECTED
Benzodiazepines: NOT DETECTED
Cocaine: NOT DETECTED
Opiates: NOT DETECTED
Tetrahydrocannabinol: NOT DETECTED

## 2020-10-22 LAB — CBC
HCT: 46.2 % (ref 39.0–52.0)
Hemoglobin: 15.4 g/dL (ref 13.0–17.0)
MCH: 30.6 pg (ref 26.0–34.0)
MCHC: 33.3 g/dL (ref 30.0–36.0)
MCV: 91.8 fL (ref 80.0–100.0)
Platelets: 282 10*3/uL (ref 150–400)
RBC: 5.03 MIL/uL (ref 4.22–5.81)
RDW: 12.5 % (ref 11.5–15.5)
WBC: 6.7 10*3/uL (ref 4.0–10.5)
nRBC: 0 % (ref 0.0–0.2)

## 2020-10-22 LAB — SALICYLATE LEVEL: Salicylate Lvl: <7 mg/dL — ABNORMAL LOW (ref 7.0–30.0)

## 2020-10-22 NOTE — ED Triage Notes (Signed)
Pt brought in under IVC by [employer], he has hx of schizophrenia and off his meds since October. Increased paranoia.   Pt denies SI or HI. Appears to be paranoid, delayed on responding to questions. I will ask one thing and then he will answer to a previous question. He is directable at this time. Admits to etoh and marijuana use.

## 2020-10-23 LAB — RESP PANEL BY RT-PCR (FLU A&B, COVID) ARPGX2
Influenza A by PCR: NEGATIVE
Influenza B by PCR: NEGATIVE
SARS Coronavirus 2 by RT PCR: POSITIVE — AB

## 2020-10-23 MED ORDER — ONDANSETRON HCL 4 MG PO TABS: 4.0000 mg | ORAL_TABLET | Freq: Three times a day (TID) | ORAL | Status: DC | PRN

## 2020-10-23 MED ORDER — ALUM & MAG HYDROXIDE-SIMETH 200-200-20 MG/5ML PO SUSP: 30.0000 mL | Freq: Four times a day (QID) | ORAL | Status: DC | PRN

## 2020-10-23 MED ORDER — IBUPROFEN 400 MG PO TABS: 600.0000 mg | ORAL_TABLET | Freq: Three times a day (TID) | ORAL | Status: DC | PRN

## 2020-10-23 NOTE — Care Management (Signed)
Writer referred patient to the following facilities:  [facility] Details Fax  [address], [place] [address]  Internal comment [facility] Details Fax  [address] [place], [place] [address]  Internal comment [facility] Details Fax  [address], [place] [address]  Internal comment [facility] Details Fax  [address] [place], [place] [address]  Internal comment [facility] Details Fax  [address] [place] [address]  Internal comment [facility] Details Fax  [address] [place] [address]  Internal comment [facility] Details Fax  [phone] [address], [place] [address]  Internal comment [facility] Details Fax  [address] [place] [address]  Internal comment [facility] Details Fax  [address] [place] [address]  Internal comment [facility] Details Fax  [address], [place] [address]  Internal comment [facility] Details Fax  [address] [place] [address]  Internal comment [facility] Details Fax  [address] [place], [place] [address]  Internal comment [facility] Details Fax  [address] [place] [address]  Internal comment

## 2020-10-23 NOTE — BH Assessment (Signed)
Comprehensive Clinical Assessment (CCA) Note  10/23/2020 [name] [id number]   Patient is a 30 year old male with a history of Schizophrenia, paranoid type and Cannabis Use Disorder who presents involuntarily via [facility] under IVC.  Patient has reportedly been off of home medications since October 2020.   Per PA's note: The patient was seen by his ACT team yesterday who placed the patient under IVC due to erratic behavior and increasing paranoia.  Patient was noted to be acting very paranoid when he noticed children outside playing during his visit with the ACT team yesterday, which they report are one of his triggers.  This happened twice, and staff became concerned that the patient was going to be involved in an altercation due to his increasingly erratic behavior.  They also report that he had the stove on and at one point walked away from the stove and forgot that it was even on.  Staff also reports that the patient appears to be responding to internal stimuli during their conversation."  Upon assessment, patient attempts to engage, however he appears to be experiencing thought blocking.  He would process the question, begin to respond and then choose the response of "I don't know" with every question.  He appears confused with some questions and eventually becomes frustrated and began responding with, "I'm tired."  He was able to verbalize that he hasn't been taking prescribed medications and that he does not want to be on medications.  [name] ended assessment at this point, as patient was unable to provide meaningful responses.    Attempted to contact patient's mother, [name], for collateral however he number is disconnected.  Also attempted to contact his grandmother, [name] and the phone continued to ring with no answer and no voicemail set up.   Disposition: Per Maxie Barb, NP given patient's current state and concerns expressed by his ACTT team,  patient meets criteria for inpatient  treatment.  Disposition SW will pursue appropriate inpatient options.    Chief Complaint:  Chief Complaint  Patient presents with   Psychiatric Evaluation   Visit Diagnosis: F 20.0  Schizophrenia, paranoid type                             R/O Alcohol Use Disorder   CCA Screening, Triage and Referral (STR)  Patient Reported Information How did you hear about [place]? Legal System  Referral name: Patient presented under IVC initiated by his ACTT team.  Referral phone number: No data recorded  Whom do you see for routine medical problems? I don't have a doctor  Practice/Facility Name: No data recorded Practice/Facility Phone Number: No data recorded Name of Contact: No data recorded Contact Number: No data recorded Contact Fax Number: No data recorded Prescriber Name: No data recorded Prescriber Address (if known): No data recorded  What Is the Reason for Your Visit/Call Today? Per ACTT team, patient has displayed increased paranoia and erratic behavior due to being off of his psychotropic medicaitons since Oct 2020.  How Long Has This Been Causing You Problems? 1-6 months  What Do You Feel Would Help You the Most Today? Medication; Therapy   Have You Recently Been in Any Inpatient Treatment (Hospital/Detox/Crisis Center/28-Day Program)? No  Name/Location of Program/Hospital:No data recorded How Long Were You There? No data recorded When Were You Discharged? No data recorded  Have You Ever Received Services From [facility] Before? No  Who Do You See at [facility]? No  data recorded  Have You Recently Had Any Thoughts About Hurting Yourself? No  Are You Planning to Commit Suicide/Harm Yourself At This time? No   Have you Recently Had Thoughts About Hurting Someone [name]? No  Explanation: No data recorded  Have You Used Any Alcohol or Drugs in the Past 24 Hours? Yes  How Long Ago Did You Use Drugs or Alcohol? 0000 (Unclear)  What Did You Use and How Much? Initially  admits to ETOH and THC use, now denies   Do You Currently Have a Therapist/Psychiatrist? Yes  Name of Therapist/Psychiatrist: ACTT Team - agency name unknown   Have You Been Recently Discharged From Any Office Practice or Programs? No  Explanation of Discharge From Practice/Program: No data recorded    CCA Screening Triage Referral Assessment Type of Contact: Tele-Assessment  Is this Initial or Reassessment? Initial Assessment  Date Telepsych consult ordered in [facility]:  10/23/2020  Time Telepsych consult ordered in [facility]:  0441   Patient Reported Information Reviewed? Yes  Patient Left Without Being Seen? No data recorded Reason for Not Completing Assessment: No data recorded  Collateral Involvement: Unable to reach emergency contacts.   Does Patient Have a Automotive engineer Guardian? No data recorded Name and Contact of Legal Guardian: NA  If Minor and Not Living with Parent(s), Who has Custody? NA  Is CPS involved or ever been involved? Never  Is APS involved or ever been involved? Never   Patient Determined To Be At Risk for Harm To Self or Others Based on Review of Patient Reported Information or Presenting Complaint? Yes, for Harm to Others  Method: No Plan  Availability of Means: No access or NA  Intent: Vague intent or NA  Notification Required: No need or identified person  Additional Information for Danger to Others Potential: Active psychosis  Additional Comments for Danger to Others Potential: History of children playing outside triggering patient.  Are There Guns or Other Weapons in Your Home? No  Types of Guns/Weapons: No data recorded Are These Weapons Safely Secured?                            No data recorded Who Could Verify You Are Able To Have These Secured: No data recorded Do You Have any Outstanding Charges, Pending Court Dates, Parole/Probation? None noted  Contacted To Inform of Risk of Harm To Self or Others: Law  Enforcement   Location of Assessment: [facility] ED   Does Patient Present under Involuntary Commitment? Yes  IVC Papers Initial File Date: 10/22/2020   [place] of Residence: [place]   Patient Currently Receiving the Following Services: ACTT Psychologist, educational)   Determination of Need: Emergent (2 hours)   Options For Referral: Inpatient Hospitalization     CCA Biopsychosocial Intake/Chief Complaint:  Patient has dificulty engaging in assessment, due to psychosis and thought blocking.  He mostly attempts to answer and then responds with "I don't know."  Current Symptoms/Problems: Patient unable to identify concerns and states he is "tired."   Patient Reported Schizophrenia/Schizoaffective Diagnosis in Past: Yes   Strengths: No data recorded Preferences: Prefers to return home  Abilities: No data recorded  Type of Services Patient Feels are Needed: Continue with ACTT services   Initial Clinical Notes/Concerns: No data recorded  Mental Health Symptoms Depression:  Difficulty Concentrating; Irritability   Duration of Depressive symptoms: Greater than two weeks   Mania:  Increased Energy; Irritability   Anxiety:  Difficulty concentrating; Irritability; Restlessness   Psychosis:  Grossly disorganized speech; Hallucinations; Delusions   Duration of Psychotic symptoms: Less than six months   Trauma:  N/A   Obsessions:  None   Compulsions:  None   Inattention:  Disorganized   Hyperactivity/Impulsivity:  N/A   Oppositional/Defiant Behaviors:  N/A   Emotional Irregularity:  Potentially harmful impulsivity; Mood lability   Other Mood/Personality Symptoms:  No data recorded   Mental Status Exam Appearance and self-care  Stature:  Average   Weight:  Average weight   Clothing:  Casual   Grooming:  Normal   Cosmetic use:  None   Posture/gait:  Normal   Motor activity:  Slowed   Sensorium  Attention:  Confused   Concentration:   Preoccupied   Orientation:  Person   Recall/memory:  Defective in Immediate; Defective in Short-term; Defective in Recent; Defective in Remote   Affect and Mood  Affect:  Negative; Restricted   Mood:  Irritable   Relating  Eye contact:  Staring   Facial expression:  Constricted   Attitude toward examiner:  Suspicious   Thought and Language  Speech flow: Blocked   Thought content:  Suspicious   Preoccupation:  -- (UTA)   Hallucinations:  -- (UTA - appears he is responding to internal stimili)   Organization:  No data recorded  [employer] of Knowledge:  -- Industrial/product designer)   Intelligence:  Needs investigation   Abstraction:  -- (UTA)   Judgement:  Impaired   Reality Testing:  Variable   Insight:  Lacking   Decision Making:  Impulsive   Social Functioning  Social Maturity:  Irresponsible   Social Judgement:  Naive   Stress  Stressors:  -- (UTA)   Coping Ability:  Overwhelmed   Skill Deficits:  Decision making; Responsibility; Self-control; Interpersonal   Supports:  Friends/Service system     Religion: Religion/Spirituality Are You A Religious Person?: No  Leisure/Recreation: Leisure / Recreation Do You Have Hobbies?: No  Exercise/Diet: Exercise/Diet Do You Exercise?: No Have You Gained or Lost A Significant Amount of Weight in the Past Six Months?: No Do You Follow a Special Diet?: No Do You Have Any Trouble Sleeping?: No   CCA Employment/Education Employment/Work Situation:    Education:     CCA Family/Childhood History Family and Relationship History: Family history Marital status: Single What is your sexual orientation?: UTA Has your sexual activity been affected by drugs, alcohol, medication, or emotional stress?: UTA Does patient have children?:  (UTA)  Childhood History:  Childhood History By whom was/is the patient raised?:  (UTA- unable to connect with collateral resources) Additional childhood history  information: UTA Description of patient's relationship with caregiver when they were a child: UTA Patient's description of current relationship with people who raised him/her: UTA How were you disciplined when you got in trouble as a child/adolescent?: UTA Does patient have siblings?:  (UTA) Did patient suffer any verbal/emotional/physical/sexual abuse as a child?:  (UTA) Did patient suffer from severe childhood neglect?:  (UTA) Has patient ever been sexually abused/assaulted/raped as an adolescent or adult?:  (UTA) Was the patient ever a victim of a crime or a disaster?: No Witnessed domestic violence?: No Has patient been affected by domestic violence as an adult?: No  Child/Adolescent Assessment:   CCA Substance Use Alcohol/Drug Use: Alcohol / Drug Use Pain Medications: unknown Prescriptions: unknown Over the Counter: unknown History of alcohol / drug use?:  (Admitted to [facility] and then denied with [facility])   ASAM's:  Six Dimensions of Multidimensional Assessment  Dimension 1:  Acute Intoxication and/or Withdrawal Potential:      Dimension 2:  Biomedical Conditions and Complications:      Dimension 3:  Emotional, Behavioral, or Cognitive Conditions and Complications:     Dimension 4:  Readiness to Change:     Dimension 5:  Relapse, Continued use, or Continued Problem Potential:     Dimension 6:  Recovery/Living Environment:     ASAM Severity Score:    ASAM Recommended Level of Treatment:     Substance use Disorder (SUD)    Recommendations for Services/Supports/Treatments:    DSM5 Diagnoses: There are no problems to display for this patient.   Patient Centered Plan: Patient is on the following Treatment Plan(s):  Impulse Control, psychosis   Referrals to Alternative Service(s): Inpatient treatment is recommended.  Patient is under IVC.  Yetta GlassmanKerrie L Kenasia Scheller, [facility]

## 2020-10-23 NOTE — BH Assessment (Signed)
TTS Counselor attempted to carry out assessment, pt was unable to participate at this time.

## 2020-10-23 NOTE — ED Notes (Signed)
Pt more talkative now, pacing at the doorway requesting his psych medications, ED dr Hyacinth Meeker notified.

## 2020-10-23 NOTE — ED Notes (Signed)
Pt belongings inventoried and placed in [facility] [address] [place] [address]. Pt valuables given to security. Pt wanded by security.

## 2020-10-23 NOTE — ED Provider Notes (Signed)
[name] EMERGENCY DEPARTMENT Provider Note   CSN: [id number] Arrival date & time: 10/22/20  2125     History Chief Complaint  Patient presents with   Psychiatric Evaluation    [name] is a 30 y.o. male with a history of schizophrenia and cannabis use disorder who presents to the emergency department under IVC.  The patient was seen by his ACT team yesterday who placed the patient under IVC due to erratic behavior and increasing paranoia.  Patient was noted to be acting very paranoid when he noticed children outside playing during his visit with the ACT team yesterday, which they report are one of his triggers.  This happened twice, and staff became concerned that the patient was going to be involved in an altercation due to his increasingly erratic behavior.  They also report that he had the stove on and at one point walked away from the stove and forgot that it was even on.  Staff also reports that the patient appears to be responding to internal stimuli during their conversation.  He has been off of his home medications since October 2020.  In the ER, patient endorses alcohol use, but none in the last 24 hours.  He also endorses marijuana use.  No other illicit or recreational substance use.  Patient appears confused, but is alert and oriented x4, but answers are delayed.  He denies chest pain, shortness of breath, fever, chills, neck pain or stiffness, visual changes, numbness, weakness.  He will answer when he has asked if he is feeling suicidal, homicidal, or having auditory visual hallucinations.  Patient's chart is currently being merged.  Patient had an ED visit on December 29 and tested positive for COVID-19 at that time.  Level 5 caveat secondary to psychiatric disorder.  The history is provided by the patient and medical records. No language interpreter was used.       No past medical history on file.  There are no problems to display for  this patient.  No family history on file.     Home Medications Prior to Admission medications   Not on File    Allergies    Patient has no allergy information on record.  Review of Systems   Review of Systems  Unable to perform ROS: Psychiatric disorder  Psychiatric/Behavioral: Positive for hallucinations.    Physical Exam Updated Vital Signs BP 117/73 (BP Location: Left Arm)    Pulse 63    Temp 97.9 F (36.6 C) (Oral)    Resp 18    SpO2 100%   Physical Exam Vitals and nursing note reviewed.  Constitutional:      General: He is not in acute distress.    Appearance: He is well-developed. He is not ill-appearing, toxic-appearing or diaphoretic.  HENT:     Head: Normocephalic.  Eyes:     Conjunctiva/sclera: Conjunctivae normal.  Cardiovascular:     Rate and Rhythm: Normal rate and regular rhythm.     Heart sounds: No murmur heard.   Pulmonary:     Effort: Pulmonary effort is normal. No respiratory distress.     Breath sounds: No stridor. No wheezing, rhonchi or rales.  Chest:     Chest wall: No tenderness.  Abdominal:     General: There is no distension.     Palpations: Abdomen is soft. There is no mass.     Tenderness: There is no abdominal tenderness. There is no right CVA tenderness, left CVA tenderness, guarding or rebound.  Hernia: No hernia is present.  Musculoskeletal:     Cervical back: Neck supple.     Right lower leg: No edema.     Left lower leg: No edema.  Skin:    General: Skin is warm and dry.     Capillary Refill: Capillary refill takes less than 2 seconds.  Neurological:     General: No focal deficit present.     Mental Status: He is alert.     Comments: Alert and oriented x4.  Psychiatric:        Mood and Affect: Affect is flat.        Speech: Speech is delayed.        Behavior: Behavior is uncooperative and withdrawn.     Comments: Patient will only intermittently answer questions.  Patient is not actively responding to internal  stimuli on my evaluation.     ED Results / Procedures / Treatments   Labs (all labs ordered are listed, but only abnormal results are displayed) Labs Reviewed  SALICYLATE LEVEL - Abnormal; Notable for the following components:      Result Value   Salicylate Lvl <7.0 (*)    All other components within normal limits  ACETAMINOPHEN LEVEL - Abnormal; Notable for the following components:   Acetaminophen (Tylenol), Serum <10 (*)    All other components within normal limits  RESP PANEL BY RT-PCR (FLU A&B, COVID) ARPGX2  COMPREHENSIVE METABOLIC PANEL  ETHANOL  CBC  RAPID URINE DRUG SCREEN, HOSP PERFORMED    EKG None  Radiology No results found.  Procedures Procedures (including critical care time)  Medications Ordered in ED Medications  ondansetron (ZOFRAN) tablet 4 mg (has no administration in time range)  ibuprofen (ADVIL) tablet 600 mg (has no administration in time range)  alum & mag hydroxide-simeth (MAALOX/MYLANTA) 200-200-20 MG/5ML suspension 30 mL (has no administration in time range)    ED Course  I have reviewed the triage vital signs and the nursing notes.  Pertinent labs & imaging results that were available during my care of the patient were reviewed by me and considered in my medical decision making (see chart for details).    MDM Rules/Calculators/A&P                          30 year old with a history of schizophrenia and cannabis use disorder who presents the emergency department under IVC for responding to internal stimuli and increasing paranoia when evaluated by his ACT team earlier today.  Patient has been off of his home medications since October 2020.  Vital signs are stable.  On my evaluation, history is limited as patient patient has delayed communication.  Patient's medical record was reviewed and this appears consistent with previous ER visits.  Patient is alert and oriented x4.  He is not actively responding to internal stimuli or exhibiting  increasing paranoia on my exam.  First examination completed by [name], attending physician.  Labs have been reviewed and independently interpreted by me.  Labs are reassuring.  Medications have not been ordered at this time as patient has not been taking them for several months.  COVID-19 test is pending.  Pt medically cleared at this time. Psych hold orders placed. TTS consult pending; please see psych team notes for further documentation of care/dispo. Pt stable at time of med clearance.    Final Clinical Impression(s) / ED Diagnoses Final diagnoses:  Paranoia (HCC)    Rx / DC Orders ED Discharge  Orders    None       Barkley Boards, PA-C 10/23/20 5003    Dione Booze, MD 10/23/20 [phone]

## 2020-10-24 DIAGNOSIS — F200 Paranoid schizophrenia: Secondary | ICD-10-CM | POA: Diagnosis present

## 2020-10-24 MED ORDER — BENZTROPINE MESYLATE 1 MG PO TABS: 1.0000 mg | ORAL_TABLET | Freq: Every day | ORAL | Status: DC

## 2020-10-24 MED ORDER — HALOPERIDOL 5 MG PO TABS: 5.0000 mg | ORAL_TABLET | Freq: Two times a day (BID) | ORAL | Status: DC

## 2020-10-24 MED ORDER — HALOPERIDOL 1 MG PO TABS: 2.0000 mg | ORAL_TABLET | Freq: Two times a day (BID) | ORAL | Status: DC

## 2020-10-24 NOTE — Discharge Instructions (Signed)
1.  Follow-up with your ACT team. 2.  Try to schedule a follow-up appointment with your family doctor soon as possible.  If you do not have a family doctor, use the referral number in your discharge instruction to help find a doctor.

## 2020-10-24 NOTE — ED Notes (Signed)
Lunch Tray Ordered @ 1037. 

## 2020-10-24 NOTE — ED Notes (Signed)
[place] [employer] for Transport @ 1638-per Alycia Rossetti, Charity fundraiser called by [employer] a

## 2020-10-24 NOTE — ED Notes (Signed)
[employer] called for transport to [facility] [employer], RN called by Marylene Land

## 2020-10-24 NOTE — Consult Note (Addendum)
[facility] Psych ED Discharge  10/24/2020 12:59 PM [name]  MRN:  [id number] Principal Problem: Schizophrenia, paranoid [facility]) Discharge Diagnoses: Principal Problem:   Schizophrenia, paranoid (HCC)  Subjective: "I'm alright."  Client calm and cooperative lying on his stretcher in an upright position watching television.  Denies suicidal/homicidal ideations, hallucinations, and substance use.  He was able to follow directions and turn off the television.  Sat in the chair and responded appropriately to questions, not responding to internal stimuli during the assessment.  COVID positive and social work consult placed for [employer] as the client is homeless based on past history and frequency in EDs.  Medications started, stable psychiatrically.  Total Time spent with patient: 45 minutes  Past Psychiatric History: schizophrenia  Past Medical History: See chart Family Psychiatric  History: unknown Social History:  Social History   Substance and Sexual Activity  Alcohol Use Not on file     Social History   Substance and Sexual Activity  Drug Use Not on file    Social History   Socioeconomic History   Marital status: Single    Spouse name: Not on file   Number of children: Not on file   Years of education: Not on file   Highest education level: Not on file  Occupational History   Not on file  Tobacco Use   Smoking status: Not on file   Smokeless tobacco: Not on file  Substance and Sexual Activity   Alcohol use: Not on file   Drug use: Not on file   Sexual activity: Not on file  Other Topics Concern   Not on file  Social History Narrative   Not on file   Social Determinants of Health   Financial Resource Strain: Not on file  Food Insecurity: Not on file  Transportation Needs: Not on file  Physical Activity: Not on file  Stress: Not on file  Social Connections: Not on file    Has this patient used any form of tobacco in the last 30 days? (Cigarettes,  Smokeless Tobacco, Cigars, and/or Pipes) A prescription for an FDA-approved tobacco cessation medication was offered at discharge and the patient refused  Current Medications: Current Facility-Administered Medications  Medication Dose Route Frequency Provider Last Rate Last Admin   alum & mag hydroxide-simeth (MAALOX/MYLANTA) 200-200-20 MG/5ML suspension 30 mL  30 mL Oral Q6H PRN McDonald, Mia A, PA-C       haloperidol (HALDOL) tablet 2 mg  2 mg Oral BID Quanta Robertshaw Y, NP       ibuprofen (ADVIL) tablet 600 mg  600 mg Oral Q8H PRN McDonald, Mia A, PA-C       ondansetron (ZOFRAN) tablet 4 mg  4 mg Oral Q8H PRN McDonald, Mia A, PA-C       No current outpatient medications on file.   PTA Medications: (Not in a hospital admission)   Musculoskeletal: Strength & Muscle Tone: within normal limits Gait & Station: normal Patient leans: N/A  Psychiatric Specialty Exam: Physical Exam Vitals and nursing note reviewed.  Constitutional:      Appearance: Normal appearance.  HENT:     Head: Normocephalic.     Nose: Nose normal.  Cardiovascular:     Pulses: Normal pulses.  Musculoskeletal:        General: Normal range of motion.     Cervical back: Normal range of motion.  Neurological:     General: No focal deficit present.     Mental Status: He is alert and oriented  to person, place, and time.  Psychiatric:        Attention and Perception: Attention and perception normal.        Mood and Affect: Mood is anxious. Affect is blunt.        Speech: Speech normal.        Behavior: Behavior normal. Behavior is cooperative.        Thought Content: Thought content normal.        Cognition and Memory: Cognition and memory normal.        Judgment: Judgment normal.     Review of Systems  Psychiatric/Behavioral: The patient is nervous/anxious.   All other systems reviewed and are negative.   Blood pressure 118/78, pulse 60, temperature 98 F (36.7 C), resp. rate 19, SpO2 99 %.There is no  height or weight on file to calculate BMI.  General Appearance: Casual  Eye Contact:  Good  Speech:  Normal Rate  Volume:  Normal  Mood:  Anxious at times  Affect:  Blunt  Thought Process:  Coherent and Descriptions of Associations: Intact  Orientation:  Full (Time, Place, and Person)  Thought Content:  Logical  Suicidal Thoughts:  No  Homicidal Thoughts:  No  Memory:  Immediate;   Fair Recent;   Fair Remote;   Fair  Judgement:  Fair  Insight:  Fair  Psychomotor Activity:  Normal  Concentration:  Concentration: Fair and Attention Span: Fair  Recall:  [employer] of Knowledge:  Fair  Language:  Good  Akathisia:  No  Handed:  Right  AIMS (if indicated):     Assets:  Leisure Time Physical Health Resilience Social Support  ADL's:  Intact  Cognition:  WNL  Sleep:        Demographic Factors:  Male and Adolescent or young adult  Loss Factors: NA  Historical Factors: NA  Risk Reduction Factors:   Sense of responsibility to family, Positive social support and Positive therapeutic relationship  Continued Clinical Symptoms:  Anxiety at times  Cognitive Features That Contribute To Risk:  None    Suicide Risk:  Minimal: No identifiable suicidal ideation.  Patients presenting with no risk factors but with morbid ruminations; may be classified as minimal risk based on the severity of the depressive symptoms   Plan Of Care/Follow-up recommendations:  Schizophrenia, paranoid type: Started Haldol 5 mg BID -Follow up with ACT team  EPS: -Started Cogentin 1 mg daily  Activity:  as tolerated Diet:  heart healthy diet  Disposition: discharge to COVID hospital Nanine Means, NP 10/24/2020, 12:59 PM

## 2020-10-24 NOTE — TOC Initial Note (Addendum)
Transition of Care [facility]) - Initial/Assessment Note    Patient Details  Name: Ladon Vandenberghe MRN: [id number] Date of Birth: 1991/03/19  Transition of Care [facility]) CM/SW Contact:    Lockie Pares, RN Phone Number: 10/24/2020, 2:06 PM  Clinical Narrative:                 Arville Lime was for IP psych, however patient now posuitive for COVID, IP psych will not accept. Cleared for discharge by TTS. Patient is homeless application started with patient by Charity fundraiser for [employer]. Will need to secure transportation to the hotel, as they are not running transportation, but they do have beds available.  1530 patient accepted to COVID hotel after application filled out and sent, however there is no way to transport patient there at this time. [facility] transport, taxis and bus service are all grounded due to weather.    Barriers to Discharge: Homeless with medical needs (weather, psych patient with COVID)   Patient Goals and CMS Choice        Expected Discharge Plan and Services                                                Prior Living Arrangements/Services                       Activities of Daily Living      Permission Sought/Granted                  Emotional Assessment              Admission diagnosis:  IVC  Patient Active Problem List   Diagnosis Date Noted  . Schizophrenia, paranoid (HCC) 10/24/2020   PCP:  Jackie Plum, MD Pharmacy:  No Pharmacies Listed    Social Determinants of Health (SDOH) Interventions    Readmission Risk Interventions No flowsheet data found.

## 2020-10-24 NOTE — ED Notes (Signed)
Pt ambulated to shower.

## 2020-10-24 NOTE — ED Notes (Signed)
Patient discharge instructions reviewed with the patient the patient verbalized understanding of instructions patient discharged.

## 2020-10-24 NOTE — ED Notes (Signed)
Patient cell phone checked out out security. Patient valuables were incorrectly inventoried by security. Patient given his cell phone back.

## 2020-12-21 ENCOUNTER — Other Ambulatory Visit: Payer: Self-pay

## 2020-12-21 ENCOUNTER — Emergency Department (HOSPITAL_COMMUNITY)
Admission: EM | Admit: 2020-12-21 | Discharge: 2020-12-23 | Disposition: A | Discharge status: Home / Self Care | Payer: Medicaid Other | Attending: Emergency Medicine | Admitting: Emergency Medicine

## 2020-12-21 ENCOUNTER — Encounter (HOSPITAL_COMMUNITY): Payer: Self-pay | Admitting: Emergency Medicine

## 2020-12-21 DIAGNOSIS — F200 Paranoid schizophrenia: Secondary | ICD-10-CM

## 2020-12-21 DIAGNOSIS — F17210 Nicotine dependence, cigarettes, uncomplicated: Secondary | ICD-10-CM | POA: Diagnosis not present

## 2020-12-21 DIAGNOSIS — Z20822 Contact with and (suspected) exposure to covid-19: Secondary | ICD-10-CM | POA: Diagnosis not present

## 2020-12-21 DIAGNOSIS — F22 Delusional disorders: Secondary | ICD-10-CM | POA: Diagnosis not present

## 2020-12-21 DIAGNOSIS — Z046 Encounter for general psychiatric examination, requested by authority: Secondary | ICD-10-CM | POA: Diagnosis present

## 2020-12-21 MED ORDER — LORAZEPAM 1 MG PO TABS
1.0000 mg | ORAL_TABLET | Freq: Once | ORAL | Status: AC
  Administered 2020-12-22: 1 mg via ORAL
  Filled 2020-12-21: qty 1

## 2020-12-21 NOTE — ED Triage Notes (Addendum)
Patient is real guarded. He states he fears for his life. He say he is not homicidal or suicidal.

## 2020-12-22 ENCOUNTER — Encounter (HOSPITAL_COMMUNITY): Payer: Self-pay | Admitting: Registered Nurse

## 2020-12-22 DIAGNOSIS — F200 Paranoid schizophrenia: Secondary | ICD-10-CM

## 2020-12-22 LAB — COMPREHENSIVE METABOLIC PANEL
ALT: 11 U/L (ref 0–44)
AST: 19 U/L (ref 15–41)
Albumin: 4.6 g/dL (ref 3.5–5.0)
Alkaline Phosphatase: 40 U/L (ref 38–126)
Anion gap: 8 (ref 5–15)
BUN: 13 mg/dL (ref 6–20)
CO2: 26 mmol/L (ref 22–32)
Calcium: 9.3 mg/dL (ref 8.9–10.3)
Chloride: 107 mmol/L (ref 98–111)
Creatinine, Ser: 1.07 mg/dL (ref 0.61–1.24)
GFR, Estimated: >60 mL/min (ref >60)
Glucose, Bld: 74 mg/dL (ref 70–99)
Potassium: 4 mmol/L (ref 3.5–5.1)
Sodium: 141 mmol/L (ref 135–145)
Total Bilirubin: 0.8 mg/dL (ref 0.3–1.2)
Total Protein: 7.1 g/dL (ref 6.5–8.1)

## 2020-12-22 LAB — CBC
HCT: 42.4 % (ref 39.0–52.0)
Hemoglobin: 14.6 g/dL (ref 13.0–17.0)
MCH: 31 pg (ref 26.0–34.0)
MCHC: 34.4 g/dL (ref 30.0–36.0)
MCV: 90 fL (ref 80.0–100.0)
Platelets: 226 10*3/uL (ref 150–400)
RBC: 4.71 MIL/uL (ref 4.22–5.81)
RDW: 12.4 % (ref 11.5–15.5)
WBC: 5.7 10*3/uL (ref 4.0–10.5)
nRBC: 0 % (ref 0.0–0.2)

## 2020-12-22 LAB — RAPID URINE DRUG SCREEN, HOSP PERFORMED
Amphetamines: NOT DETECTED
Barbiturates: NOT DETECTED
Benzodiazepines: NOT DETECTED
Cocaine: NOT DETECTED
Opiates: NOT DETECTED
Tetrahydrocannabinol: NOT DETECTED

## 2020-12-22 LAB — RESP PANEL BY RT-PCR (FLU A&B, COVID) ARPGX2
Influenza A by PCR: NEGATIVE
Influenza B by PCR: NEGATIVE
SARS Coronavirus 2 by RT PCR: NEGATIVE

## 2020-12-22 LAB — ACETAMINOPHEN LEVEL: Acetaminophen (Tylenol), Serum: <10 ug/mL — ABNORMAL LOW (ref 10–30)

## 2020-12-22 LAB — ETHANOL: Alcohol, Ethyl (B): <10 mg/dL (ref <10)

## 2020-12-22 LAB — SALICYLATE LEVEL: Salicylate Lvl: <7 mg/dL — ABNORMAL LOW (ref 7.0–30.0)

## 2020-12-22 MED ORDER — HYDROXYZINE HCL 25 MG PO TABS
25.0000 mg | ORAL_TABLET | Freq: Three times a day (TID) | ORAL | Status: DC | PRN
  Administered 2020-12-22 – 2020-12-23 (×2): 25 mg via ORAL
  Filled 2020-12-22 (×2): qty 1

## 2020-12-22 MED ORDER — BENZTROPINE MESYLATE 1 MG PO TABS
1.0000 mg | ORAL_TABLET | Freq: Every day | ORAL | Status: DC
  Administered 2020-12-22 – 2020-12-23 (×2): 1 mg via ORAL
  Filled 2020-12-22 (×3): qty 1

## 2020-12-22 MED ORDER — PALIPERIDONE ER 6 MG PO TB24
6.0000 mg | ORAL_TABLET | Freq: Every day | ORAL | Status: DC
  Administered 2020-12-22 – 2020-12-23 (×2): 6 mg via ORAL
  Filled 2020-12-22 (×2): qty 1

## 2020-12-22 NOTE — ED Notes (Signed)
Patient is resting comfortably. 

## 2020-12-22 NOTE — ED Notes (Signed)
Pt denies SI/HI at this time.  

## 2020-12-22 NOTE — BH Assessment (Signed)
Comprehensive Clinical Assessment (CCA) Note  12/22/2020 [name] [id number]   Disposition [name], NP, recommends overnight observation for safety and stabilization with psych reassessment in the AM. Milagros Loll, RN informed of acceptance to [facility]. Marchelle Folks, RN at [facility] informed of acceptance.   The patient demonstrates the following risk factors for suicide: Chronic risk factors for suicide include: psychiatric disorder of schizophrenia. Acute risk factors for suicide include: family or marital conflict, unemployment and loss (financial, interpersonal, professional). Protective factors for this patient include: none reported. Considering these factors, the overall suicide risk at this point is low. Patient is appropriate for outpatient follow up.   Flowsheet Row ED from 12/21/2020 in [facility] Most recent reading at 12/21/2020 11:11 PM ED from 08/06/2020 in [facility] Most recent reading at 08/06/2020  6:24 AM ED from 08/06/2020 in [facility] Most recent reading at 08/05/2020 11:11 PM  C-SSRS RISK CATEGORY No Risk No Risk No Risk     Patient monitored by tele-sitter.  [name] is a 30 year old male presenting voluntarily due to paranoia, stating "I am scared for my life". Patient denied SI, HI and psychosis. Patient is guarded and cannot state why he is scared for his life. When asked, is someone out to get you, patient said "I don't know". When asked, do you think someone is going to get you, patient stated "I don't know". Patient reported to EDP he needs "a medicine to calm him". Patient responds to most questions "I don't know". Patient attempts to engage, however he appears to be experiencing thought blocking.  He would process the question, begin to respond and then choose the response of "I don't know" or "no ma'am" with every question.  He appears confused with some questions and eventually  becomes frustrated.  Per chart, it states patient has a legal guardian, however, name and contact information is not listed. Patient denied contact with mother and grandmother. Patient chart does not state who is the legal guardian. Per chart, patient is currently receiving medication management with ACT Team, even though when asked about medication management, patient stated "I don't know".   Chief Complaint:  Chief Complaint  Patient presents with  . Psychiatric Evaluation   Visit Diagnosis: F 20.0  Schizophrenia, paranoid type  CCA Biopsychosocial Intake/Chief Complaint:  "I am scared for my life", no additional information shared by patient.  Current Symptoms/Problems: Patient unable to identify concerns.  Patient Reported Schizophrenia/Schizoaffective Diagnosis in Past: Yes  Strengths: uta  Preferences: uta  Abilities: uta  Type of Services Patient Feels are Needed: Continue with ACTT services  Initial Clinical Notes/Concerns: Pt is a poor historian  Mental Health Symptoms Depression:  None   Duration of Depressive symptoms: -- (uta)   Mania:  Increased Energy; Irritability   Anxiety:   Restlessness; Worrying   Psychosis:  Delusions   Duration of Psychotic symptoms: -- (uta)   Trauma:  N/A   Obsessions:  None   Compulsions:  None   Inattention:  None   Hyperactivity/Impulsivity:  N/A   Oppositional/Defiant Behaviors:  N/A; None   Emotional Irregularity:  None   Other Mood/Personality Symptoms:  No data recorded   Mental Status Exam Appearance and self-care  Stature:  Average   Weight:  Average weight   Clothing:  Casual   Grooming:  Normal   Cosmetic use:  None   Posture/gait:  Normal   Motor activity:  Not Remarkable   Sensorium  Attention:  Normal   Concentration:  Normal   Orientation:  X5   Recall/memory:  Defective in Immediate; Defective in Short-term; Defective in Recent; Defective in Remote   Affect and Mood  Affect:   Flat; Anxious   Mood:  Anxious   Relating  Eye contact:  Normal   Facial expression:  Constricted   Attitude toward examiner:  Guarded   Thought and Language  Speech flow: Blocked   Thought content:  Delusions   Preoccupation:  -- (UTA)   Hallucinations:  -- (UTA - appears he is responding to internal stimili)   Organization:  No data recorded  [employer] of Knowledge:  Fair Industrial/product designer)   Intelligence:  Needs investigation   Abstraction:  -- (UTA)   Judgement:  Impaired   Reality Testing:  Variable   Insight:  Lacking   Decision Making:  Impulsive   Social Functioning  Social Maturity:  Irresponsible   Social Judgement:  Naive   Stress  Stressors:  -- (UTA)   Coping Ability:  Overwhelmed; Deficient supports   Skill Deficits:  Decision making; Responsibility; Self-control; Interpersonal   Supports:  Friends/Service system    Religion: Religion/Spirituality Are You A Religious Person?: No  Leisure/Recreation: Leisure / Recreation Do You Have Hobbies?: No  Exercise/Diet: Exercise/Diet Do You Exercise?: No Have You Gained or Lost A Significant Amount of Weight in the Past Six Months?: No Do You Follow a Special Diet?: No Do You Have Any Trouble Sleeping?: No  CCA Employment/Education Employment/Work Situation: Employment / Work Situation Employment situation: Unemployed Industrial/product designer) Patient's job has been impacted by current illness:  (UTA) What is the longest time patient has a held a job?: UTA 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 Has patient ever been in the [employer]?: No (Per chart.)  Education: Education Is Patient Currently Attending School?: No Last Grade Completed: 9 (Per chart, 9th or 10th grade.) Name of High School: [employer] Did Garment/textile technologist From [employer]?: No (Per chart.) Did You Attend College?: No (Per chart.) Did You Attend Graduate School?: No (Per chart.) Did You Have Any Special Interests In School?: UTA Did  You Have An Individualized Education Program (IIEP):  (UTA) Did You Have Any Difficulty At School?:  (UTA)   CCA Family/Childhood History Family and Relationship History: Family history Marital status: Single Are you sexually active?:  (UTA) What is your sexual orientation?: UTA Has your sexual activity been affected by drugs, alcohol, medication, or emotional stress?: UTA Does patient have children?:  (UTA)  Childhood History:  Childhood History By whom was/is the patient raised?:  (UTA- unable to connect with collateral resources) Additional childhood history information: UTA Description of patient's relationship with caregiver when they were a child: UTA Patient's description of current relationship with people who raised him/her: UTA How were you disciplined when you got in trouble as a child/adolescent?: UTA Does patient have siblings?:  (UTA) Did patient suffer any verbal/emotional/physical/sexual abuse as a child?:  (UTA) Did patient suffer from severe childhood neglect?:  (UTA) Has patient ever been sexually abused/assaulted/raped as an adolescent or adult?:  (UTA) Was the patient ever a victim of a crime or a disaster?: No Witnessed domestic violence?: No Has patient been affected by domestic violence as an adult?: No  Child/Adolescent Assessment:   CCA Substance Use Alcohol/Drug Use: Alcohol / Drug Use Pain Medications: see MAR Prescriptions: see MAR Over the Counter: see MAR History of alcohol / drug use?: No history of alcohol / drug  abuse (Admitted to [facility] and then denied with [name]) Longest period of sobriety (when/how long): Unknown Negative Consequences of Use: Personal relationships Withdrawal Symptoms:  (Denies)   ASAM's:  Six Dimensions of Multidimensional Assessment  Dimension 1:  Acute Intoxication and/or Withdrawal Potential:      Dimension 2:  Biomedical Conditions and Complications:      Dimension 3:  Emotional, Behavioral, or Cognitive Conditions and  Complications:     Dimension 4:  Readiness to Change:     Dimension 5:  Relapse, Continued use, or Continued Problem Potential:     Dimension 6:  Recovery/Living Environment:     ASAM Severity Score:    ASAM Recommended Level of Treatment:     Substance use Disorder (SUD)   Recommendations for Services/Supports/Treatments: Recommendations for Services/Supports/Treatments Recommendations For Services/Supports/Treatments: Inpatient Hospitalization (Continuing Observation Unit.)  DSM5 Diagnoses: Patient Active Problem List   Diagnosis Date Noted  . Schizophrenia, paranoid type (HCC) 10/24/2020  . Other schizophrenia (HCC) 02/14/2018  . Cannabis use disorder, moderate, dependence (HCC) 05/10/2016   Patient Centered Plan: Patient is on the following Treatment Plan(s):   Referrals to Alternative Service(s): Referred to Alternative Service(s):   Place:   Date:   Time:    Referred to Alternative Service(s):   Place:   Date:   Time:    Referred to Alternative Service(s):   Place:   Date:   Time:    Referred to Alternative Service(s):   Place:   Date:   Time:     Burnetta Sabin, [facility]

## 2020-12-22 NOTE — ED Notes (Signed)
Pt cooperative. Quiet. Guarded. Blunted. Pt paranoid.  Marland Kitchen

## 2020-12-22 NOTE — ED Notes (Signed)
Pt requested to take a shower.  Pt given supplies and assisted to shower.

## 2020-12-22 NOTE — ED Notes (Signed)
Mr Sheard c/o  Feeling anxious medicated per order atarax

## 2020-12-22 NOTE — Consult Note (Signed)
Telepsych Consultation   Reason for Consult:  Paranoia Referring Physician:  Lucien Mons EDP Location of Patient: [facility] ED Location of Provider: Other: [facility]  Patient Identification: [name] MRN:  [id number] Principal Diagnosis: Schizophrenia, paranoid type (HCC) Diagnosis:  Principal Problem:   Schizophrenia, paranoid type (HCC)   Total Time spent with patient: 30 minutes  Subjective:   [name] is a 30 y.o. male patient admitted [facility] ED after presenting voluntarily with complaints of paranoia.  HPI:  [name], 30 y.o., male patient seen via tele health by this provider, consulted with Dr. Jola Babinski; and chart reviewed on 12/22/20.  On evaluation Jovoni Parrillo reports he came to the hospital because "I was scared for my life."  Patient is unable to tell why he is scared for his life stating "I don't know."  Patient answers most questions with "I don't know."  He does states he is homeless but doesn't know for how long, He denies having a prior psychiatric history but states he was seeing a psychiatrist named Barbara Cower [place] by the [employer] but doesn't know the name of the company [place] worked for or the last time he was see."  Patient doesn't know if he was ever on any psychotropic medication.  States he has no family or friends.  Patient also denies alcohol and illicit drug use. During evaluation Darren Gazzola is laying in bed relaxed with hands behind his head watching TV in no acute distress.  He is alert, calm, and cooperative.  He is oriented x 3.  Patient stated that current year is 2020, he was unable to give the names of any of the last 3 presidents.  He knew he was in [place], [place] but did not know what county.  He gave the correct DOB but said he was 30 yrs old.    His mood guarded.  He does not appear to be responding to internal/external stimuli or delusional thoughts.  Patient denies suicidal/self-harm/homicidal ideation, psychosis, and paranoia.  Patient answered question  appropriately.  Past Psychiatric History: Cannabis use disorder, Schizophrenia paranoid type, alcohol use disorder  Risk to Self:  No Risk to Others:  No Prior Inpatient Therapy:  Yes Prior Outpatient Therapy:  Yes  Past Medical History:  Past Medical History:  Diagnosis Date  . Depression   . Schizophrenia [facility])     Past Surgical History:  Procedure Laterality Date  . BACK SURGERY     Family History: History reviewed. No pertinent family history. Family Psychiatric  History: Unaware Social History:  Social History   Substance and Sexual Activity  Alcohol Use No     Social History   Substance and Sexual Activity  Drug Use Yes  . Types: Marijuana   Comment: rare    Social History   Socioeconomic History  . Marital status: Single    Spouse name: Not on file  . Number of children: Not on file  . Years of education: Not on file  . Highest education level: Not on file  Occupational History  . Occupation: unemployed  Tobacco Use  . Smoking status: Current Some Day Smoker    Types: Cigarettes  . Smokeless tobacco: Never Used  Vaping Use  . Vaping Use: Never used  Substance and Sexual Activity  . Alcohol use: No  . Drug use: Yes    Types: Marijuana    Comment: rare  . Sexual activity: Yes    Birth control/protection: Condom  Other Topics Concern  . Not on file  Social History Narrative   ** Merged History Encounter **       UTA certain topics - tangential thought process, cannot answer clearly. Pt not sure about school; reportedly is homeless.    Social Determinants of Health   Financial Resource Strain: Not on file  Food Insecurity: Not on file  Transportation Needs: Not on file  Physical Activity: Not on file  Stress: Not on file  Social Connections: Not on file   Additional Social History:    Allergies:   Allergies  Allergen Reactions  . Bee Pollen Itching  . Bee Pollen Itching    Per patient's other chart  . Shellfish Allergy Hives and  Itching    seafood  . Shellfish Allergy Hives and Itching    Per patient's other chart    Labs:  Results for orders placed or performed during the hospital encounter of 12/21/20 (from the past 48 hour(s))  Comprehensive metabolic panel     Status: None   Collection Time: 12/21/20 12:00 AM  Result Value Ref Range   Sodium 141 135 - 145 mmol/L   Potassium 4.0 3.5 - 5.1 mmol/L   Chloride 107 98 - 111 mmol/L   CO2 26 22 - 32 mmol/L   Glucose, Bld 74 70 - 99 mg/dL    Comment: Glucose reference range applies only to samples taken after fasting for at least 8 hours.   BUN 13 6 - 20 mg/dL   Creatinine, Ser 8.93 0.61 - 1.24 mg/dL   Calcium 9.3 8.9 - 81.0 mg/dL   Total Protein 7.1 6.5 - 8.1 g/dL   Albumin 4.6 3.5 - 5.0 g/dL   AST 19 15 - 41 U/L   ALT 11 0 - 44 U/L   Alkaline Phosphatase 40 38 - 126 U/L   Total Bilirubin 0.8 0.3 - 1.2 mg/dL   GFR, Estimated >17 >51 mL/min    Comment: (NOTE) Calculated using the CKD-EPI Creatinine Equation (2021)    Anion gap 8 5 - 15    Comment: Performed at [facility], [address], [place], [place] [address]  Ethanol     Status: None   Collection Time: 12/21/20 12:00 AM  Result Value Ref Range   Alcohol, Ethyl (B) <10 <10 mg/dL    Comment: (NOTE) Lowest detectable limit for serum alcohol is 10 mg/dL.  For medical purposes only. Performed at [facility], [address] [place], [place] [address]   Salicylate level     Status: Abnormal   Collection Time: 12/21/20 12:00 AM  Result Value Ref Range   Salicylate Lvl <7.0 (L) 7.0 - 30.0 mg/dL    Comment: Performed at [facility], [address], [place], [place] [address]  Acetaminophen level     Status: Abnormal   Collection Time: 12/21/20 12:00 AM  Result Value Ref Range   Acetaminophen (Tylenol), Serum <10 (L) 10 - 30 ug/mL    Comment: (NOTE) Therapeutic concentrations vary significantly. A range of 10-30 ug/mL  may be an  effective concentration for many patients. However, some  are best treated at concentrations outside of this range. Acetaminophen concentrations >150 ug/mL at 4 hours after ingestion  and >50 ug/mL at 12 hours after ingestion are often associated with  toxic reactions.  Performed at [facility], [address], [place], [place] [address]   cbc     Status: None   Collection Time: 12/21/20 12:00 AM  Result Value Ref Range  WBC 5.7 4.0 - 10.5 K/uL   RBC 4.71 4.22 - 5.81 MIL/uL   Hemoglobin 14.6 13.0 - 17.0 g/dL   HCT 16.142.4 09.639.0 - 04.552.0 %   MCV 90.0 80.0 - 100.0 fL   MCH 31.0 26.0 - 34.0 pg   MCHC 34.4 30.0 - 36.0 g/dL   RDW 40.912.4 81.111.5 - 91.415.5 %   Platelets 226 150 - 400 K/uL   nRBC 0.0 0.0 - 0.2 %    Comment: Performed at [facility], [address] [place], [place] [address]  Rapid urine drug screen (hospital performed)     Status: None   Collection Time: 12/21/20 12:00 AM  Result Value Ref Range   Opiates NONE DETECTED NONE DETECTED   Cocaine NONE DETECTED NONE DETECTED   Benzodiazepines NONE DETECTED NONE DETECTED   Amphetamines NONE DETECTED NONE DETECTED   Tetrahydrocannabinol NONE DETECTED NONE DETECTED   Barbiturates NONE DETECTED NONE DETECTED    Comment: (NOTE) DRUG SCREEN FOR MEDICAL PURPOSES ONLY.  IF CONFIRMATION IS NEEDED FOR ANY PURPOSE, NOTIFY LAB WITHIN 5 DAYS.  LOWEST DETECTABLE LIMITS FOR URINE DRUG SCREEN Drug Class                     Cutoff (ng/mL) Amphetamine and metabolites    1000 Barbiturate and metabolites    200 Benzodiazepine                 200 Tricyclics and metabolites     300 Opiates and metabolites        300 Cocaine and metabolites        300 THC                            50 Performed at [facility], [address] [place], [place] [address]   Resp Panel by RT-PCR (Flu A&B, Covid)     Status: None   Collection Time: 12/21/20 12:00 AM   Specimen: Nasopharyngeal(NP) swabs in  vial transport medium  Result Value Ref Range   SARS Coronavirus 2 by RT PCR NEGATIVE NEGATIVE    Comment: (NOTE) SARS-CoV-2 target nucleic acids are NOT DETECTED.  The SARS-CoV-2 RNA is generally detectable in upper respiratory specimens during the acute phase of infection. The lowest concentration of SARS-CoV-2 viral copies this assay can detect is 138 copies/mL. A negative result does not preclude SARS-Cov-2 infection and should not be used as the sole basis for treatment or other patient management decisions. A negative result may occur with  improper specimen collection/handling, submission of specimen other than nasopharyngeal swab, presence of viral mutation(s) within the areas targeted by this assay, and inadequate number of viral copies(<138 copies/mL). A negative result must be combined with clinical observations, patient history, and epidemiological information. The expected result is Negative.  Fact Sheet for Patients:  BloggerCourse.comhttps://www.fda.gov/media/152166/download  Fact Sheet for Healthcare Providers:  SeriousBroker.ithttps://www.fda.gov/media/152162/download  This test is no t yet approved or cleared by the [place] FDA and  has been authorized for detection and/or diagnosis of SARS-CoV-2 by FDA under an Emergency Use Authorization (EUA). This EUA will remain  in effect (meaning this test can be used) for the duration of the COVID-19 declaration under Section 564(b)(1) of the Act, 21 U.S.C.section 360bbb-3(b)(1), unless the authorization is terminated  or revoked sooner.       Influenza A by PCR NEGATIVE NEGATIVE   Influenza B by PCR NEGATIVE NEGATIVE  Comment: (NOTE) The Xpert Xpress SARS-CoV-2/FLU/RSV plus assay is intended as an aid in the diagnosis of influenza from Nasopharyngeal swab specimens and should not be used as a sole basis for treatment. Nasal washings and aspirates are unacceptable for Xpert Xpress SARS-CoV-2/FLU/RSV testing.  Fact Sheet for  Patients: BloggerCourse.com  Fact Sheet for Healthcare Providers: SeriousBroker.it  This test is not yet approved or cleared by the [place] FDA and has been authorized for detection and/or diagnosis of SARS-CoV-2 by FDA under an Emergency Use Authorization (EUA). This EUA will remain in effect (meaning this test can be used) for the duration of the COVID-19 declaration under Section 564(b)(1) of the Act, 21 U.S.C. section 360bbb-3(b)(1), unless the authorization is terminated or revoked.  Performed at [facility], [address] [place], [place] [address]     Medications:  Current Facility-Administered Medications  Medication Dose Route Frequency Provider Last Rate Last Admin  . benztropine (COGENTIN) tablet 1 mg  1 mg Oral Daily Jazaria Jarecki B, NP      . hydrOXYzine (ATARAX/VISTARIL) tablet 25 mg  25 mg Oral TID PRN Benay Pomeroy B, NP      . paliperidone (INVEGA) 24 hr tablet 6 mg  6 mg Oral Daily Talvin Christianson B, NP       No current outpatient medications on file.    Musculoskeletal: Strength & Muscle Tone: within normal limits Gait & Station: normal Patient leans: N/A  Psychiatric Specialty Exam: Physical Exam Vitals and nursing note reviewed. Chaperone present: Sitter at bedside.  Constitutional:      General: He is not in acute distress.    Appearance: Normal appearance. He is not ill-appearing.  Cardiovascular:     Rate and Rhythm: Normal rate.  Pulmonary:     Effort: Pulmonary effort is normal.  Musculoskeletal:        General: Normal range of motion.     Cervical back: Normal range of motion.  Neurological:     Mental Status: He is alert and oriented to person, place, and time.  Psychiatric:        Attention and Perception: Attention and perception normal. He does not perceive auditory or visual hallucinations.        Mood and Affect: Affect is labile.        Speech: Speech  normal.        Behavior: Behavior normal. Behavior is cooperative.        Thought Content: Thought content is paranoid. Thought content is not delusional. Thought content does not include homicidal or suicidal ideation.        Cognition and Memory: Memory is impaired.        Judgment: Judgment normal.     Review of Systems  Constitutional: Negative.   HENT: Negative.   Eyes: Negative.   Respiratory: Negative.   Cardiovascular: Negative.   Gastrointestinal: Negative.   Genitourinary: Negative.   Musculoskeletal: Negative.   Skin: Negative.   Neurological: Negative.   Hematological: Negative.   Psychiatric/Behavioral: Positive for confusion. Negative for agitation, behavioral problems, hallucinations, self-injury, sleep disturbance and suicidal ideas. The patient is not nervous/anxious.        Patient states he is scared for his life but doesn't know why or how long he has felt this way.    Blood pressure 109/69, pulse 80, temperature 98.4 F (36.9 C), temperature source Oral, resp. rate 16, SpO2 100 %.There is no height or weight on file to calculate BMI.  General Appearance: Casual  Eye Contact:  Good  Speech:  Clear and Coherent and Pressured  Volume:  Normal  Mood:  Guarded  Affect:  Flat  Thought Process:  Coherent and Linear  Orientation:  Full (Time, Place, and Person)  Thought Content:  Paranoid Ideation  Suicidal Thoughts:  No  Homicidal Thoughts:  No  Memory:  Immediate;   Poor Recent;   Poor Remote;   Poor  Judgement:  Fair  Insight:  Lacking  Psychomotor Activity:  Normal  Concentration:  Concentration: Fair and Attention Span: Fair  Recall:  Poor  Fund of Knowledge:  Poor  Language:  Fair  Akathisia:  No  Handed:  Right  AIMS (if indicated):     Assets:  Desire for Improvement  ADL's:  Intact  Cognition:  WNL  Sleep:        Treatment Plan Summary: Plan Inpatient psychiatric treatment  Medications started see above  Disposition: Recommend  psychiatric Inpatient admission when medically cleared.  This service was provided via telemedicine using a 2-way, interactive audio and video technology.  Names of all persons participating in this telemedicine service and their role in this encounter. Name: [name] Role: NP  Name: Dr. Jola Babinski Role: Psychiatrist  Name: [name] Role: Patient  Name: Dr. Particia Nearing Role:[facility] EDP sent a secure message informing patient recommended for inpatient psychiatric treatment     [name], NP 12/22/2020 2:59 PM

## 2020-12-22 NOTE — ED Provider Notes (Signed)
[facility]-EMERGENCY DEPT Provider Note   CSN: [id number] Arrival date & time: 12/21/20  2301     History Chief Complaint  Patient presents with  . Psychiatric Evaluation    [name] is a 30 y.o. male presenting for psychiatric evaluation.  Patient states he is here because he needs help because he is scared for his life.  He cannot state why.  He states he needs "a medicine to calm him." He does not respond when asked if he is suicidal or homicidal.  His response to most questions is "I do not know."  This includes when he is asked if he is taking any medication, if he is hearing or seeing things, or why he is feeling this way.  He cannot tell me how long this has been going on.  Additional history taken chart reviewed.  Patient with a history of paranoid schizophrenia.  Additional history of cannabis use.  Per chart review, patient was last seen in our system 2 months ago for paranoid schizophrenia, started on Haldol and Cogentin at that time.  Per [facility], pt called them to be brought to the hospital, he is not under IVC.   HPI     Past Medical History:  Diagnosis Date  . Depression   . Schizophrenia [facility])     Patient Active Problem List   Diagnosis Date Noted  . Schizophrenia, paranoid type (HCC) 10/24/2020  . Other schizophrenia (HCC) 02/14/2018  . Cannabis use disorder, moderate, dependence (HCC) 05/10/2016    Past Surgical History:  Procedure Laterality Date  . BACK SURGERY         History reviewed. No pertinent family history.  Social History   Tobacco Use  . Smoking status: Current Some Day Smoker    Types: Cigarettes  . Smokeless tobacco: Never Used  Vaping Use  . Vaping Use: Never used  Substance Use Topics  . Alcohol use: No  . Drug use: Yes    Types: Marijuana    Comment: rare    Home Medications Prior to Admission medications   Not on File    Allergies    Bee pollen, Bee pollen, Shellfish allergy, and Shellfish  allergy  Review of Systems   Review of Systems  Unable to perform ROS: Psychiatric disorder  Psychiatric/Behavioral: Positive for agitation. The patient is nervous/anxious.     Physical Exam Updated Vital Signs BP 125/86 (BP Location: Right Arm)   Pulse 70   Temp 98.4 F (36.9 C) (Oral)   Resp 16   SpO2 100%   Physical Exam Vitals and nursing note reviewed.  Constitutional:      General: He is not in acute distress.    Appearance: He is well-developed.     Comments: Appears nontoxic  HENT:     Head: Normocephalic and atraumatic.  Eyes:     Conjunctiva/sclera: Conjunctivae normal.     Pupils: Pupils are equal, round, and reactive to light.  Cardiovascular:     Rate and Rhythm: Normal rate and regular rhythm.     Pulses: Normal pulses.  Pulmonary:     Effort: Pulmonary effort is normal. No respiratory distress.     Breath sounds: Normal breath sounds. No wheezing.  Abdominal:     General: There is no distension.     Palpations: Abdomen is soft. There is no mass.     Tenderness: There is no abdominal tenderness. There is no guarding or rebound.  Musculoskeletal:  General: Normal range of motion.     Cervical back: Normal range of motion and neck supple.  Skin:    General: Skin is warm and dry.     Capillary Refill: Capillary refill takes less than 2 seconds.  Neurological:     Mental Status: He is alert and oriented to person, place, and time.  Psychiatric:        Speech: He is noncommunicative.        Behavior: Behavior is hyperactive.        Thought Content: Thought content is paranoid.     Comments: Pt is paranoid. Hyperactive movements, but minimal interaction/communication.  Pt is clearly responding to internal stimuli     ED Results / Procedures / Treatments   Labs (all labs ordered are listed, but only abnormal results are displayed) Labs Reviewed  SALICYLATE LEVEL - Abnormal; Notable for the following components:      Result Value   Salicylate  Lvl <7.0 (*)    All other components within normal limits  ACETAMINOPHEN LEVEL - Abnormal; Notable for the following components:   Acetaminophen (Tylenol), Serum <10 (*)    All other components within normal limits  RESP PANEL BY RT-PCR (FLU A&B, COVID) ARPGX2  COMPREHENSIVE METABOLIC PANEL  ETHANOL  CBC  RAPID URINE DRUG SCREEN, HOSP PERFORMED    EKG None  Radiology No results found.  Procedures Procedures   Medications Ordered in ED Medications  LORazepam (ATIVAN) tablet 1 mg (1 mg Oral Given 12/22/20 0007)    ED Course  I have reviewed the triage vital signs and the nursing notes.  Pertinent labs & imaging results that were available during my care of the patient were reviewed by me and considered in my medical decision making (see chart for details).    MDM Rules/Calculators/A&P                          Pt presenting for psychiatric evaluation.  On my exam, patient appears nontoxic.  Although he is obviously responding to internal stimuli and is paranoid.  He will need a psychiatric evaluation.  Will obtain screening labs.  Labs interpreted by me, overall reassuring.  Patient medically cleared for psychiatric evaluation.   Behavioral health team recommends transfer to the behavioral urgent care for further evaluation and management. As pt is sleeping calmly, plan for transport in the morning.   The patient has been placed in psychiatric observation due to the need to provide a safe environment for the patient while obtaining psychiatric consultation and evaluation, as well as ongoing medical and medication management to treat the patient's condition.  The patient has not been placed under full IVC at this time.  Final Clinical Impression(s) / ED Diagnoses Final diagnoses:  Paranoia [facility])    Rx / DC Orders ED Discharge Orders    None       Alveria Apley, PA-C 12/22/20 0454    Gilda Crease, MD 12/22/20 [phone]

## 2020-12-23 MED ORDER — PALIPERIDONE PALMITATE ER 117 MG/0.75ML IM SUSY
117.0000 mg | PREFILLED_SYRINGE | Freq: Once | INTRAMUSCULAR | Status: DC
  Filled 2020-12-23: qty 117

## 2020-12-23 MED ORDER — ALUM & MAG HYDROXIDE-SIMETH 200-200-20 MG/5ML PO SUSP
30.0000 mL | Freq: Once | ORAL | Status: AC
  Administered 2020-12-23: 30 mL via ORAL
  Filled 2020-12-23: qty 30

## 2020-12-23 NOTE — ED Provider Notes (Signed)
Emergency Medicine Observation Re-evaluation Note  [name] is a 30 y.o. male, seen on rounds today.  Pt initially presented to the ED for complaints of Psychiatric Evaluation Currently, the patient is awaiting psychiatric placement.  Physical Exam  BP 109/68 (BP Location: Right Arm)   Pulse 81   Temp 98 F (36.7 C) (Oral)   Resp 20   SpO2 100%  Physical Exam General: NAD Cardiac: RRR Lungs: CTA Psych: Sleeping comfortably  ED Course / MDM   I have reviewed the labs performed to date as well as medications administered while in observation.  Recent changes in the last 24 hours include no acute events.  Plan  Current plan is for pending placement. Patient is not under full IVC at this time.   Wynetta Fines, MD 12/23/20 [phone]

## 2020-12-23 NOTE — Discharge Instructions (Signed)
For your behavioral health needs you are advised to continue treatment with the [facility] ACT Team:       [facility]      [address]      [place], [place] [address]      Office number: [phone]      Crisis number: [phone]

## 2020-12-23 NOTE — ED Notes (Signed)
Lunch tray given. 

## 2020-12-23 NOTE — Consult Note (Signed)
Telepsych Consultation   Reason for Consult:  Psychiatry provider reassessment Referring Physician: Dr. Rodena Medin Location of Patient: [name] emergency department Location of Provider: Behavioral Health TTS Department  Patient Identification: Allyn Bertoni MRN:  [id number] Principal Diagnosis: Schizophrenia, paranoid type [facility]) Diagnosis:  Principal Problem:   Schizophrenia, paranoid type (HCC)   Total Time spent with patient: 30 minutes  Subjective:   [name] is a 30 y.o. male patient.  Patient repeatedly answers "I do not know" multiple times throughout assessment.   HPI:   When asked why patient came to emergency department he reported that on yesterday he felt "scared for my life."  Patient reports he has recently resided at the homeless shelter.  Patient today denies symptoms of paranoia.  Patient assessed by nurse practitioner.  Patient alert and oriented.  Patient noted to have blunted affect.  Patient answers I do not know to multiple questions during assessment.  Patient is cooperative and actively participates in assessment.  Patient denies suicidal and homicidal ideations.  Patient denies auditory and visual hallucinations.  Patient is reluctant to discuss mental health diagnoses and is unable to recall medications.  Patient reports he is not prescribed medications and he does not currently take any medications.  Patient reports he is not followed by outpatient psychiatry.  Regarding ACT team patient states "I do not know."   Patient gives verbal consent to speak with his grandmother, Linna Caprice.  To phone number provided in chart, no answer with no voicemail at either number.  Patient gives verbal consent to speak with act team.  Disposition spoke with patient's act team.  It appears patient is at baseline currently.  Patient does not consistently receive Invega 117 mg as ordered as he does not follow-up with act team as scheduled.  Act team aware of patient's  admission and need for follow-up.  Will initiate order for Invega today as acting report patient has not received this medication from their team in approximately 6 months.  Past Psychiatric History: Paranoid schizophrenia, cannabis use disorder  Risk to Self:  Denies Risk to Others:  Denies Prior Inpatient Therapy:  Admitted to [facility] behavioral health in the past Prior Outpatient Therapy:  Recently following by act Team  Past Medical History:  Past Medical History:  Diagnosis Date  . Depression   . Schizophrenia [facility])     Past Surgical History:  Procedure Laterality Date  . BACK SURGERY     Family History: History reviewed. No pertinent family history. Family Psychiatric  History: None reported Social History:  Social History   Substance and Sexual Activity  Alcohol Use No     Social History   Substance and Sexual Activity  Drug Use Yes  . Types: Marijuana   Comment: rare    Social History   Socioeconomic History  . Marital status: Single    Spouse name: Not on file  . Number of children: Not on file  . Years of education: Not on file  . Highest education level: Not on file  Occupational History  . Occupation: unemployed  Tobacco Use  . Smoking status: Current Some Day Smoker    Types: Cigarettes  . Smokeless tobacco: Never Used  Vaping Use  . Vaping Use: Never used  Substance and Sexual Activity  . Alcohol use: No  . Drug use: Yes    Types: Marijuana    Comment: rare  . Sexual activity: Yes    Birth control/protection: Condom  Other Topics Concern  . Not  on file  Social History Narrative   ** Merged History Encounter **       [facility] certain topics - tangential thought process, cannot answer clearly. Pt not sure about school; reportedly is homeless.    Social Determinants of Health   Financial Resource Strain: Not on file  Food Insecurity: Not on file  Transportation Needs: Not on file  Physical Activity: Not on file  Stress: Not on file  Social  Connections: Not on file   Additional Social History:    Allergies:   Allergies  Allergen Reactions  . Bee Pollen Itching  . Bee Pollen Itching    Per patient's other chart  . Shellfish Allergy Hives and Itching    seafood  . Shellfish Allergy Hives and Itching    Per patient's other chart    Labs: No results found for this or any previous visit (from the past 48 hour(s)).  Medications:  Current Facility-Administered Medications  Medication Dose Route Frequency Provider Last Rate Last Admin  . benztropine (COGENTIN) tablet 1 mg  1 mg Oral Daily Rankin, Shuvon B, NP   1 mg at 12/22/20 1631  . hydrOXYzine (ATARAX/VISTARIL) tablet 25 mg  25 mg Oral TID PRN Rankin, Shuvon B, NP   25 mg at 12/22/20 1931  . paliperidone (INVEGA) 24 hr tablet 6 mg  6 mg Oral Daily Rankin, Shuvon B, NP   6 mg at 12/22/20 1630   No current outpatient medications on file.    Musculoskeletal: Strength & Muscle Tone: within normal limits Gait & Station: normal Patient leans: N/A  Psychiatric Specialty Exam: Physical Exam Vitals and nursing note reviewed.  Constitutional:      Appearance: He is well-developed.  HENT:     Head: Normocephalic.  Cardiovascular:     Rate and Rhythm: Normal rate.  Pulmonary:     Effort: Pulmonary effort is normal.  Neurological:     Mental Status: He is alert and oriented to person, place, and time.  Psychiatric:        Attention and Perception: Attention and perception normal.        Mood and Affect: Mood normal. Affect is blunt.        Speech: Speech normal.        Behavior: Behavior is cooperative.        Thought Content: Thought content normal.        Cognition and Memory: Cognition normal.        Judgment: Judgment normal.     Review of Systems  Constitutional: Negative.   HENT: Negative.   Eyes: Negative.   Respiratory: Negative.   Cardiovascular: Negative.   Gastrointestinal: Negative.   Genitourinary: Negative.   Musculoskeletal: Negative.    Skin: Negative.   Neurological: Negative.   Psychiatric/Behavioral: Negative.     Blood pressure 109/68, pulse 81, temperature 98 F (36.7 C), temperature source Oral, resp. rate 20, SpO2 100 %.There is no height or weight on file to calculate BMI.  General Appearance: Casual  Eye Contact:  Good  Speech:  Normal Rate  Volume:  Normal  Mood:  Euphoric  Affect:  Flat  Thought Process:  Coherent, Goal Directed and Descriptions of Associations: Intact  Orientation:  Full (Time, Place, and Person)  Thought Content:  Logical  Suicidal Thoughts:  No  Homicidal Thoughts:  No  Memory:  Immediate;   Fair Recent;   Fair Remote;   Fair  Judgement:  Fair  Insight:  Lacking  Psychomotor Activity:  Normal  Concentration:  Concentration: Fair and Attention Span: Fair  Recall:  Good  Fund of Knowledge:  Good  Language:  Good  Akathisia:  No  Handed:  Right  AIMS (if indicated):     Assets:  Communication Skills Desire for Improvement Financial Resources/Insurance Intimacy Leisure Time Physical Health Resilience Social Support  ADL's:  Intact  Cognition:  WNL  Sleep:        Treatment Plan Summary: Reordered home medication: Medication management  -Invega Sustenna 117 mg IM once Patient reviewed with Dr. Bronwen Betters. Patient cleared by psychiatry, follow-up with established act team.  Disposition: No evidence of imminent risk to self or others at present.   Patient does not meet criteria for psychiatric inpatient admission. Supportive therapy provided about ongoing stressors. Discussed crisis plan, support from social network, calling 911, coming to the Emergency Department, and calling Suicide Hotline.  This service was provided via telemedicine using a 2-way, interactive audio and video technology.  Names of all persons participating in this telemedicine service and their role in this encounter. Name: [name] Role: Patient  Name: Berneice Heinrich Role: FNP  Name: Dr. Bronwen Betters  Role: Psychiatry    Patrcia Dolly, FNP 12/23/2020 9:46 AM

## 2020-12-23 NOTE — BH Assessment (Addendum)
BHH Assessment Progress Note  Per Berneice Heinrich, NP, this voluntary pt does not require psychiatric hospitalization at this time, but would benefit from further observation and stabilization.  As of this writing, no beds are available at the [facility], so pt will remain at [facility] in the meantime.  Per [name], pt may receive ACT Team services from [place], and he may receive IM Invega.  At her request I called the [facility] ACT Team at 10:14.  They report that pt is supposed to receive a 117 mg injection, but has not had it in at least 6 months.  They report that pt historically requests stimulant medications for putative ADHD, but pt has never been diagnosed with this disorder.  I was advised to speak to pt's [facility] therapist, [name] [phone]) for further details.  Chart indicates that pt has a legal guardian, however, I was not able to find a letter of guardianship in his record.  I then called the [facility] and was routed to pt's care coordinator, Remigio Eisenmenger [phone]).  She reports that pt does not have a guardian, but that pt's aunt, [name] ([phone]) is his payee.  Clydie Braun reports that there is reason to believe that [place] does not handle this responsibility in pt's interest, however, APS refuses to take a report about the case.  I then spoke to Olmito and Olmito with [place], who confirms these details.  She reports that due to pt's paranoia, he typically refuses Invega injection, but she believes that receiving it would greatly improve his reality testing and would help him to cooperate with ACT Team services and decision making in general.  For instance, pt receives funding for housing through [facility], and in fact, has a dwelling, but he does not live there, choosing instead to stay at an abandoned apartment complex where he is more difficult to locate.  All of these details have been shared with [name], and pt's EPIC record has been updated to reflect his guardianship  standing and his best contacts.    Pt is to remain at [facility] for further observation and stabilization, possibly including Invega injection.  Recommendation to continue treatment with the [facility] Team has been included in pt's discharge instructions in anticipation of future discharge.  EDP Kristine Royal, MD and pt's nurse, Florentina Addison, have been notified.  Doylene Canning, MA Triage Specialist [phone]   Addendum:  Per [name], pt is psychiatrically cleared.  Pt was offered [place] injection, which he refused.  Earlier today Clayton from [place] called to follow up, leaving a voice message for me.  At 15:49 I called back to update her.  Call rolled to voice mail that could not accept messages.  I then called [facility] ACT team lead [phone]), and informed her.  Finally, at 16:02 I called Clydie Braun, pt's [facility] care coordinator, and informed her.  EDP Alvester Chou, MD has been updated, along with Katie.  Doylene Canning, [place] Behavioral Health Coordinator [phone]

## 2020-12-23 NOTE — ED Notes (Signed)
Pt said he needs transportation. Arranging safe transport.

## 2020-12-27 ENCOUNTER — Other Ambulatory Visit: Payer: Self-pay

## 2020-12-27 ENCOUNTER — Ambulatory Visit (HOSPITAL_COMMUNITY): Admission: EM | Admit: 2020-12-27 | Discharge: 2020-12-27 | Discharge status: Absent without leave | Payer: Self-pay

## 2020-12-27 DIAGNOSIS — F32A Depression, unspecified: Secondary | ICD-10-CM | POA: Insufficient documentation

## 2020-12-27 DIAGNOSIS — F1290 Cannabis use, unspecified, uncomplicated: Secondary | ICD-10-CM | POA: Insufficient documentation

## 2020-12-27 DIAGNOSIS — F411 Generalized anxiety disorder: Secondary | ICD-10-CM | POA: Insufficient documentation

## 2020-12-27 DIAGNOSIS — Z20822 Contact with and (suspected) exposure to covid-19: Secondary | ICD-10-CM | POA: Insufficient documentation

## 2020-12-27 DIAGNOSIS — Z79899 Other long term (current) drug therapy: Secondary | ICD-10-CM | POA: Insufficient documentation

## 2020-12-27 DIAGNOSIS — G4700 Insomnia, unspecified: Secondary | ICD-10-CM | POA: Insufficient documentation

## 2020-12-27 DIAGNOSIS — R450 Nervousness: Secondary | ICD-10-CM | POA: Insufficient documentation

## 2020-12-27 DIAGNOSIS — F17210 Nicotine dependence, cigarettes, uncomplicated: Secondary | ICD-10-CM | POA: Insufficient documentation

## 2020-12-27 DIAGNOSIS — Z5900 Homelessness unspecified: Secondary | ICD-10-CM | POA: Insufficient documentation

## 2020-12-27 DIAGNOSIS — Z560 Unemployment, unspecified: Secondary | ICD-10-CM | POA: Insufficient documentation

## 2020-12-27 DIAGNOSIS — F200 Paranoid schizophrenia: Secondary | ICD-10-CM | POA: Insufficient documentation

## 2020-12-27 DIAGNOSIS — R454 Irritability and anger: Secondary | ICD-10-CM | POA: Insufficient documentation

## 2020-12-27 DIAGNOSIS — R4587 Impulsiveness: Secondary | ICD-10-CM | POA: Insufficient documentation

## 2020-12-28 ENCOUNTER — Other Ambulatory Visit: Payer: Self-pay

## 2020-12-28 ENCOUNTER — Encounter (HOSPITAL_COMMUNITY): Payer: Self-pay

## 2020-12-28 ENCOUNTER — Ambulatory Visit (HOSPITAL_COMMUNITY)
Admission: EM | Admit: 2020-12-28 | Discharge: 2020-12-28 | Disposition: A | Discharge status: Home / Self Care | Payer: Medicaid Other | Attending: Nurse Practitioner | Admitting: Nurse Practitioner

## 2020-12-28 DIAGNOSIS — F17210 Nicotine dependence, cigarettes, uncomplicated: Secondary | ICD-10-CM | POA: Insufficient documentation

## 2020-12-28 DIAGNOSIS — R41840 Attention and concentration deficit: Secondary | ICD-10-CM | POA: Diagnosis present

## 2020-12-28 DIAGNOSIS — F32A Depression, unspecified: Secondary | ICD-10-CM | POA: Diagnosis not present

## 2020-12-28 DIAGNOSIS — Z20822 Contact with and (suspected) exposure to covid-19: Secondary | ICD-10-CM | POA: Diagnosis not present

## 2020-12-28 DIAGNOSIS — F200 Paranoid schizophrenia: Secondary | ICD-10-CM | POA: Diagnosis not present

## 2020-12-28 DIAGNOSIS — F411 Generalized anxiety disorder: Secondary | ICD-10-CM | POA: Diagnosis not present

## 2020-12-28 DIAGNOSIS — Z5900 Homelessness unspecified: Secondary | ICD-10-CM

## 2020-12-28 LAB — CBC WITH DIFFERENTIAL/PLATELET
Abs Immature Granulocytes: 0.03 10*3/uL (ref 0.00–0.07)
Basophils Absolute: 0.1 10*3/uL (ref 0.0–0.1)
Basophils Relative: 1 %
Eosinophils Absolute: 0.6 10*3/uL — ABNORMAL HIGH (ref 0.0–0.5)
Eosinophils Relative: 9 %
HCT: 44 % (ref 39.0–52.0)
Hemoglobin: 15.1 g/dL (ref 13.0–17.0)
Immature Granulocytes: 0 %
Lymphocytes Relative: 45 %
Lymphs Abs: 3.2 10*3/uL (ref 0.7–4.0)
MCH: 30.6 pg (ref 26.0–34.0)
MCHC: 34.3 g/dL (ref 30.0–36.0)
MCV: 89.1 fL (ref 80.0–100.0)
Monocytes Absolute: 0.5 10*3/uL (ref 0.1–1.0)
Monocytes Relative: 7 %
Neutro Abs: 2.7 10*3/uL (ref 1.7–7.7)
Neutrophils Relative %: 38 %
Platelets: 250 10*3/uL (ref 150–400)
RBC: 4.94 MIL/uL (ref 4.22–5.81)
RDW: 12.8 % (ref 11.5–15.5)
WBC: 7.1 10*3/uL (ref 4.0–10.5)
nRBC: 0 % (ref 0.0–0.2)

## 2020-12-28 LAB — COMPREHENSIVE METABOLIC PANEL
ALT: 25 U/L (ref 0–44)
AST: 26 U/L (ref 15–41)
Albumin: 4.4 g/dL (ref 3.5–5.0)
Alkaline Phosphatase: 46 U/L (ref 38–126)
Anion gap: 9 (ref 5–15)
BUN: 17 mg/dL (ref 6–20)
CO2: 27 mmol/L (ref 22–32)
Calcium: 9.6 mg/dL (ref 8.9–10.3)
Chloride: 102 mmol/L (ref 98–111)
Creatinine, Ser: 1.03 mg/dL (ref 0.61–1.24)
GFR, Estimated: >60 mL/min (ref >60)
Glucose, Bld: 105 mg/dL — ABNORMAL HIGH (ref 70–99)
Potassium: 3.8 mmol/L (ref 3.5–5.1)
Sodium: 138 mmol/L (ref 135–145)
Total Bilirubin: 0.7 mg/dL (ref 0.3–1.2)
Total Protein: 6.9 g/dL (ref 6.5–8.1)

## 2020-12-28 LAB — RESP PANEL BY RT-PCR (FLU A&B, COVID) ARPGX2
Influenza A by PCR: NEGATIVE
Influenza B by PCR: NEGATIVE
SARS Coronavirus 2 by RT PCR: NEGATIVE

## 2020-12-28 LAB — LIPID PANEL
Cholesterol: 188 mg/dL (ref 0–200)
HDL: 77 mg/dL (ref >40)
LDL Cholesterol: 97 mg/dL (ref 0–99)
Total CHOL/HDL Ratio: 2.4 RATIO
Triglycerides: 69 mg/dL (ref <150)
VLDL: 14 mg/dL (ref 0–40)

## 2020-12-28 LAB — POCT URINE DRUG SCREEN - MANUAL ENTRY (I-SCREEN)
POC Amphetamine UR: NOT DETECTED
POC Buprenorphine (BUP): NOT DETECTED
POC Cocaine UR: NOT DETECTED
POC Marijuana UR: POSITIVE — AB
POC Methadone UR: NOT DETECTED
POC Methamphetamine UR: NOT DETECTED
POC Morphine: NOT DETECTED
POC Oxazepam (BZO): NOT DETECTED
POC Oxycodone UR: NOT DETECTED
POC Secobarbital (BAR): NOT DETECTED

## 2020-12-28 LAB — HEMOGLOBIN A1C
Hgb A1c MFr Bld: 5.5 % (ref 4.8–5.6)
Mean Plasma Glucose: 111.15 mg/dL

## 2020-12-28 LAB — POC SARS CORONAVIRUS 2 AG: SARS Coronavirus 2 Ag: NEGATIVE

## 2020-12-28 LAB — TSH: TSH: 2.629 u[IU]/mL (ref 0.350–4.500)

## 2020-12-28 MED ORDER — OLANZAPINE 5 MG PO TABS
5.0000 mg | ORAL_TABLET | Freq: Once | ORAL | Status: AC
  Administered 2020-12-28: 5 mg via ORAL
  Filled 2020-12-28: qty 1

## 2020-12-28 MED ORDER — HYDROXYZINE HCL 25 MG PO TABS: 25.0000 mg | ORAL_TABLET | Freq: Three times a day (TID) | ORAL | Status: DC | PRN

## 2020-12-28 MED ORDER — MAGNESIUM HYDROXIDE 400 MG/5ML PO SUSP: 30.0000 mL | Freq: Every day | ORAL | Status: DC | PRN

## 2020-12-28 MED ORDER — ALUM & MAG HYDROXIDE-SIMETH 200-200-20 MG/5ML PO SUSP: 30.0000 mL | ORAL | Status: DC | PRN

## 2020-12-28 MED ORDER — ACETAMINOPHEN 325 MG PO TABS: 650.0000 mg | ORAL_TABLET | Freq: Four times a day (QID) | ORAL | Status: DC | PRN

## 2020-12-28 NOTE — ED Notes (Signed)
Patient denies pain and is resting comfortably.  

## 2020-12-28 NOTE — Discharge Instructions (Addendum)

## 2020-12-28 NOTE — Progress Notes (Signed)
Pt is sleeping. Resps are even and unlabored. No signs of acute distress noted. Staff will monitor for pt's safety. 

## 2020-12-28 NOTE — ED Triage Notes (Signed)
Pt requesting medication to get his mind right. Denies si/hi.

## 2020-12-28 NOTE — ED Provider Notes (Signed)
Behavioral Health Admission H&P [facility] & OBS)  Date: 12/28/20 Patient Name: [name] MRN: [id number] Chief Complaint: No chief complaint on file.  Chief Complaint/Presenting Problem: "I am scared for my life", no additional information shared by patient.  Diagnoses:  Final diagnoses:  Schizophrenia, paranoid (HCC)    HPI: [name] is a 30 y.o. male with a history of schizophrenia who presents to [facility] voluntarily to [facility] due to paranoia. Patient reports that he is afraid to go outside because "people are trying to kill me." He states that he needs medication "to help me chill." States that he does not want trazodone or cogentin because "I feel like I can't breathe when I take them and I need to be able to breathe because I like my body." Patient appears to have thought blocking at times. Repeatedly states "no, no, no" or "I don't know" when asked some questions. Patient denies suicidal ideations. He denies auditory and visual hallucinations. No indication that he is responding to internal stimuli. Patient reports use of marijuana. Denies use of other substances. UDS pending collection.   Per chart review patient was evaluated in the ED on 12/23/2020 and discharged after receiving Invega Sustenna 117 mg. Patient receives ACT services through [facility].   PHQ 2-9:  Flowsheet Row ED from 05/08/2020 in [facility]  Thoughts that you would be better off dead, or of hurting yourself in some way Not at all  PHQ-9 Total Score 6      Flowsheet Row ED from 12/28/2020 in [facility] ED from 12/21/2020 in [facility] ED from 08/06/2020 in [facility]  C-SSRS RISK CATEGORY No Risk No Risk No Risk       Total Time spent with patient: 20 minutes  Musculoskeletal  Strength & Muscle Tone: within normal limits Gait & Station: normal Patient leans: N/A  Psychiatric Specialty Exam   Presentation General Appearance: Appropriate for Environment; Casual; Fairly Groomed  Eye Contact:Fair  Speech:Blocked  Speech Volume:Normal  Handedness:Right   Mood and Affect  Mood:Anxious  Affect:Appropriate; Blunt   Thought Process  Thought Processes:Coherent  Descriptions of Associations:Intact  Orientation:Full (Time, Place and Person)  Thought Content:Paranoid Ideation  Diagnosis of Schizophrenia or Schizoaffective disorder in past: Yes  Duration of Psychotic Symptoms: Greater than six months  Hallucinations:Hallucinations: None  Ideas of Reference:None  Suicidal Thoughts:Suicidal Thoughts: No  Homicidal Thoughts:Homicidal Thoughts: No   Sensorium  Memory:Immediate Fair; Recent Fair  Judgment:Intact  Insight:Fair   Executive Functions  Concentration:Fair  Attention Span:Fair  Recall:Fair  Fund of Knowledge:Fair  Language:Fair   Psychomotor Activity  Psychomotor Activity:Psychomotor Activity: Restlessness   Assets  Assets:Desire for Improvement; Housing; Health and safety inspector; Physical Health   Sleep  Sleep:Sleep: Fair   Nutritional Assessment (For OBS and FBC admissions only) Has the patient had a weight loss or gain of 10 pounds or more in the last 3 months?: No Has the patient had a decrease in food intake/or appetite?: No Does the patient have dental problems?: No Does the patient have eating habits or behaviors that may be indicators of an eating disorder including binging or inducing vomiting?: No Has the patient recently lost weight without trying?: No Has the patient been eating poorly because of a decreased appetite?: No Malnutrition Screening Tool Score: 0    Physical Exam Constitutional:      General: He is not in acute distress.    Appearance: He is not ill-appearing, toxic-appearing or diaphoretic.  HENT:     Head: Normocephalic.     Right Ear: External ear normal.     Left Ear: External ear normal.   Eyes:     Pupils: Pupils are equal, round, and reactive to light.  Cardiovascular:     Rate and Rhythm: Normal rate.  Pulmonary:     Effort: Pulmonary effort is normal. No respiratory distress.  Musculoskeletal:        General: Normal range of motion.  Skin:    General: Skin is warm and dry.  Neurological:     Mental Status: He is alert and oriented to person, place, and time.  Psychiatric:        Mood and Affect: Mood is anxious and depressed.        Speech: Speech normal.        Behavior: Behavior is cooperative.        Thought Content: Thought content is paranoid. Thought content is not delusional. Thought content does not include homicidal or suicidal ideation. Thought content does not include suicidal plan.    Review of Systems  Constitutional: Negative for chills, diaphoresis, fever, malaise/fatigue and weight loss.  HENT: Negative for congestion.   Respiratory: Negative for cough and shortness of breath.   Cardiovascular: Negative for chest pain and palpitations.  Gastrointestinal: Negative for diarrhea, nausea and vomiting.  Neurological: Negative for dizziness and seizures.  Psychiatric/Behavioral: Positive for depression and substance abuse. Negative for hallucinations, memory loss and suicidal ideas. The patient is nervous/anxious and has insomnia.   All other systems reviewed and are negative.   Blood pressure 132/88, pulse 80, temperature 97.8 F (36.6 C), temperature source Oral, resp. rate 18, SpO2 100 %. There is no height or weight on file to calculate BMI.  Past Psychiatric History: Schizophrenia, cannabis use disorder  Is the patient at risk to self? No  Has the patient been a risk to self in the past 6 months? No .    Has the patient been a risk to self within the distant past? No   Is the patient a risk to others? No   Has the patient been a risk to others in the past 6 months? No   Has the patient been a risk to others within the distant past? No    Past Medical History:  Past Medical History:  Diagnosis Date  . Depression   . Schizophrenia [facility])     Past Surgical History:  Procedure Laterality Date  . BACK SURGERY      Family History: No family history on file.  Social History:  Social History   Socioeconomic History  . Marital status: Single    Spouse name: Not on file  . Number of children: Not on file  . Years of education: Not on file  . Highest education level: Not on file  Occupational History  . Occupation: unemployed  Tobacco Use  . Smoking status: Current Some Day Smoker    Types: Cigarettes  . Smokeless tobacco: Never Used  Vaping Use  . Vaping Use: Never used  Substance and Sexual Activity  . Alcohol use: No  . Drug use: Yes    Types: Marijuana    Comment: rare  . Sexual activity: Yes    Birth control/protection: Condom  Other Topics Concern  . Not on file  Social History Narrative   ** Merged History Encounter **       UTA certain topics - tangential thought process, cannot answer clearly. Pt not sure about  school; reportedly is homeless.    Social Determinants of Health   Financial Resource Strain: Not on file  Food Insecurity: Not on file  Transportation Needs: Not on file  Physical Activity: Not on file  Stress: Not on file  Social Connections: Not on file  Intimate Partner Violence: Not on file    SDOH:  SDOH Screenings   Alcohol Screen: Low Risk   . Last Alcohol Screening Score (AUDIT): 0  Depression (PHQ2-9): Medium Risk  . PHQ-2 Score: 6  Financial Resource Strain: Not on file  Food Insecurity: Not on file  Housing: Not on file  Physical Activity: Not on file  Social Connections: Not on file  Stress: Not on file  Tobacco Use: High Risk  . Smoking Tobacco Use: Current Some Day Smoker  . Smokeless Tobacco Use: Never Used  Transportation Needs: Not on file    Last Labs:  Admission on 12/28/2020  Component Date Value Ref Range Status  . SARS Coronavirus 2 by RT PCR  12/28/2020 NEGATIVE  NEGATIVE Final   Comment: (NOTE) SARS-CoV-2 target nucleic acids are NOT DETECTED.  The SARS-CoV-2 RNA is generally detectable in upper respiratory specimens during the acute phase of infection. The lowest concentration of SARS-CoV-2 viral copies this assay can detect is 138 copies/mL. A negative result does not preclude SARS-Cov-2 infection and should not be used as the sole basis for treatment or other patient management decisions. A negative result may occur with  improper specimen collection/handling, submission of specimen other than nasopharyngeal swab, presence of viral mutation(s) within the areas targeted by this assay, and inadequate number of viral copies(<138 copies/mL). A negative result must be combined with clinical observations, patient history, and epidemiological information. The expected result is Negative.  Fact Sheet for Patients:  BloggerCourse.com  Fact Sheet for Healthcare Providers:  SeriousBroker.it  This test is no                          t yet approved or cleared by the [place] FDA and  has been authorized for detection and/or diagnosis of SARS-CoV-2 by FDA under an Emergency Use Authorization (EUA). This EUA will remain  in effect (meaning this test can be used) for the duration of the COVID-19 declaration under Section 564(b)(1) of the Act, 21 U.S.C.section 360bbb-3(b)(1), unless the authorization is terminated  or revoked sooner.      . Influenza A by PCR 12/28/2020 NEGATIVE  NEGATIVE Final  . Influenza B by PCR 12/28/2020 NEGATIVE  NEGATIVE Final   Comment: (NOTE) The Xpert Xpress SARS-CoV-2/FLU/RSV plus assay is intended as an aid in the diagnosis of influenza from Nasopharyngeal swab specimens and should not be used as a sole basis for treatment. Nasal washings and aspirates are unacceptable for Xpert Xpress SARS-CoV-2/FLU/RSV testing.  Fact Sheet for  Patients: BloggerCourse.com  Fact Sheet for Healthcare Providers: SeriousBroker.it  This test is not yet approved or cleared by the [place] FDA and has been authorized for detection and/or diagnosis of SARS-CoV-2 by FDA under an Emergency Use Authorization (EUA). This EUA will remain in effect (meaning this test can be used) for the duration of the COVID-19 declaration under Section 564(b)(1) of the Act, 21 U.S.C. section 360bbb-3(b)(1), unless the authorization is terminated or revoked.  Performed at [facility], [address] [place], [place] [address]   . WBC 12/28/2020 7.1  4.0 - 10.5 K/uL Final  . RBC 12/28/2020 4.94  4.22 - 5.81  MIL/uL Final  . Hemoglobin 12/28/2020 15.1  13.0 - 17.0 g/dL Final  . HCT 40/98/1191 44.0  39.0 - 52.0 % Final  . MCV 12/28/2020 89.1  80.0 - 100.0 fL Final  . MCH 12/28/2020 30.6  26.0 - 34.0 pg Final  . MCHC 12/28/2020 34.3  30.0 - 36.0 g/dL Final  . RDW 47/82/9562 12.8  11.5 - 15.5 % Final  . Platelets 12/28/2020 250  150 - 400 K/uL Final  . nRBC 12/28/2020 0.0  0.0 - 0.2 % Final  . Neutrophils Relative % 12/28/2020 38  % Final  . Neutro Abs 12/28/2020 2.7  1.7 - 7.7 K/uL Final  . Lymphocytes Relative 12/28/2020 45  % Final  . Lymphs Abs 12/28/2020 3.2  0.7 - 4.0 K/uL Final  . Monocytes Relative 12/28/2020 7  % Final  . Monocytes Absolute 12/28/2020 0.5  0.1 - 1.0 K/uL Final  . Eosinophils Relative 12/28/2020 9  % Final  . Eosinophils Absolute 12/28/2020 0.6* 0.0 - 0.5 K/uL Final  . Basophils Relative 12/28/2020 1  % Final  . Basophils Absolute 12/28/2020 0.1  0.0 - 0.1 K/uL Final  . Immature Granulocytes 12/28/2020 0  % Final  . Abs Immature Granulocytes 12/28/2020 0.03  0.00 - 0.07 K/uL Final   Performed at [facility], [address], [place], [place] [address]  . Sodium 12/28/2020 138  135 - 145 mmol/L Final  . Potassium 12/28/2020 3.8  3.5 - 5.1 mmol/L Final  .  Chloride 12/28/2020 102  98 - 111 mmol/L Final  . CO2 12/28/2020 27  22 - 32 mmol/L Final  . Glucose, Bld 12/28/2020 105* 70 - 99 mg/dL Final   Glucose reference range applies only to samples taken after fasting for at least 8 hours.  . BUN 12/28/2020 17  6 - 20 mg/dL Final  . Creatinine, Ser 12/28/2020 1.03  0.61 - 1.24 mg/dL Final  . Calcium 57/84/6962 9.6  8.9 - 10.3 mg/dL Final  . Total Protein 12/28/2020 6.9  6.5 - 8.1 g/dL Final  . Albumin 95/28/4132 4.4  3.5 - 5.0 g/dL Final  . AST 44/10/270 26  15 - 41 U/L Final  . ALT 12/28/2020 25  0 - 44 U/L Final  . Alkaline Phosphatase 12/28/2020 46  38 - 126 U/L Final  . Total Bilirubin 12/28/2020 0.7  0.3 - 1.2 mg/dL Final  . GFR, Estimated 12/28/2020 >60  >60 mL/min Final   Comment: (NOTE) Calculated using the CKD-EPI Creatinine Equation (2021)   . Anion gap 12/28/2020 9  5 - 15 Final   Performed at [facility], [address], [place], [place] [address]  . Hgb A1c MFr Bld 12/28/2020 5.5  4.8 - 5.6 % Final   Comment: (NOTE) Pre diabetes:          5.7%-6.4%  Diabetes:              >6.4%  Glycemic control for   <7.0% adults with diabetes   . Mean Plasma Glucose 12/28/2020 111.15  mg/dL Final   Performed at [facility], [address], [place], [place] [address]  . TSH 12/28/2020 2.629  0.350 - 4.500 uIU/mL Final   Comment: Performed by a 3rd Generation assay with a functional sensitivity of <=0.01 uIU/mL. Performed at [facility], [address], [place], [place] [address]   . SARS Coronavirus 2 Ag 12/28/2020 NEGATIVE  NEGATIVE Final   Comment: (NOTE) SARS-CoV-2 antigen NOT DETECTED.  Negative results are presumptive.  Negative results do not preclude SARS-CoV-2 infection and should not be used as the sole basis for treatment or other patient management decisions, including infection  control decisions, particularly in the presence of clinical signs and  symptoms consistent with COVID-19, or in those who  have been in contact with the virus.  Negative results must be combined with clinical observations, patient history, and epidemiological information. The expected result is Negative.  Fact Sheet for Patients: https://www.jennings-kim.com/  Fact Sheet for Healthcare Providers: https://alexander-rogers.biz/  This test is not yet approved or cleared by the [place] FDA and  has been authorized for detection and/or diagnosis of SARS-CoV-2 by FDA under an Emergency Use Authorization (EUA).  This EUA will remain in effect (meaning this test can be used) for the duration of  the COV                          ID-19 declaration under Section 564(b)(1) of the Act, 21 U.S.C. section 360bbb-3(b)(1), unless the authorization is terminated or revoked sooner.    . Cholesterol 12/28/2020 188  0 - 200 mg/dL Final  . Triglycerides 12/28/2020 69  <150 mg/dL Final  . HDL 16/07/9603 77  >40 mg/dL Final  . Total CHOL/HDL Ratio 12/28/2020 2.4  RATIO Final  . VLDL 12/28/2020 14  0 - 40 mg/dL Final  . LDL Cholesterol 12/28/2020 97  0 - 99 mg/dL Final   Comment:        Total Cholesterol/HDL:CHD Risk Coronary Heart Disease Risk Table                     Men   Women  1/2 Average Risk   3.4   3.3  Average Risk       5.0   4.4  2 X Average Risk   9.6   7.1  3 X Average Risk  23.4   11.0        Use the calculated Patient Ratio above and the CHD Risk Table to determine the patient's CHD Risk.        ATP III CLASSIFICATION (LDL):  <100     mg/dL   Optimal  540-981  mg/dL   Near or Above                    Optimal  130-159  mg/dL   Borderline  191-478  mg/dL   High  >295     mg/dL   Very High Performed at [facility], [address], [place], [place] [address]   Admission on 12/21/2020, Discharged on 12/23/2020  Component Date Value Ref Range Status  . Sodium 12/21/2020 141  135 - 145 mmol/L Final  . Potassium 12/21/2020 4.0  3.5 - 5.1 mmol/L Final  . Chloride  12/21/2020 107  98 - 111 mmol/L Final  . CO2 12/21/2020 26  22 - 32 mmol/L Final  . Glucose, Bld 12/21/2020 74  70 - 99 mg/dL Final   Glucose reference range applies only to samples taken after fasting for at least 8 hours.  . BUN 12/21/2020 13  6 - 20 mg/dL Final  . Creatinine, Ser 12/21/2020 1.07  0.61 - 1.24 mg/dL Final  . Calcium 86/57/8469 9.3  8.9 - 10.3 mg/dL Final  . Total Protein 12/21/2020 7.1  6.5 - 8.1 g/dL Final  . Albumin 62/95/2841 4.6  3.5 - 5.0 g/dL Final  . AST 32/44/0102  19  15 - 41 U/L Final  . ALT 12/21/2020 11  0 - 44 U/L Final  . Alkaline Phosphatase 12/21/2020 40  38 - 126 U/L Final  . Total Bilirubin 12/21/2020 0.8  0.3 - 1.2 mg/dL Final  . GFR, Estimated 12/21/2020 >60  >60 mL/min Final   Comment: (NOTE) Calculated using the CKD-EPI Creatinine Equation (2021)   . Anion gap 12/21/2020 8  5 - 15 Final   Performed at [facility], [address], [place], [place] [address]  . Alcohol, Ethyl (B) 12/21/2020 <10  <10 mg/dL Final   Comment: (NOTE) Lowest detectable limit for serum alcohol is 10 mg/dL.  For medical purposes only. Performed at [facility], [address], [place], [place] [address]   . Salicylate Lvl 12/21/2020 <7.0* 7.0 - 30.0 mg/dL Final   Performed at [facility], [address], [place], [place] [address]  . Acetaminophen (Tylenol), Serum 12/21/2020 <10* 10 - 30 ug/mL Final   Comment: (NOTE) Therapeutic concentrations vary significantly. A range of 10-30 ug/mL  may be an effective concentration for many patients. However, some  are best treated at concentrations outside of this range. Acetaminophen concentrations >150 ug/mL at 4 hours after ingestion  and >50 ug/mL at 12 hours after ingestion are often associated with  toxic reactions.  Performed at [facility], [address], [place], [place] [address]   . WBC 12/21/2020 5.7  4.0 - 10.5 K/uL Final  . RBC  12/21/2020 4.71  4.22 - 5.81 MIL/uL Final  . Hemoglobin 12/21/2020 14.6  13.0 - 17.0 g/dL Final  . HCT 09/98/3382 42.4  39.0 - 52.0 % Final  . MCV 12/21/2020 90.0  80.0 - 100.0 fL Final  . MCH 12/21/2020 31.0  26.0 - 34.0 pg Final  . MCHC 12/21/2020 34.4  30.0 - 36.0 g/dL Final  . RDW 50/53/9767 12.4  11.5 - 15.5 % Final  . Platelets 12/21/2020 226  150 - 400 K/uL Final  . nRBC 12/21/2020 0.0  0.0 - 0.2 % Final   Performed at [facility], [address] [place], [place] [address]  . Opiates 12/21/2020 NONE DETECTED  NONE DETECTED Final  . Cocaine 12/21/2020 NONE DETECTED  NONE DETECTED Final  . Benzodiazepines 12/21/2020 NONE DETECTED  NONE DETECTED Final  . Amphetamines 12/21/2020 NONE DETECTED  NONE DETECTED Final  . Tetrahydrocannabinol 12/21/2020 NONE DETECTED  NONE DETECTED Final  . Barbiturates 12/21/2020 NONE DETECTED  NONE DETECTED Final   Comment: (NOTE) DRUG SCREEN FOR MEDICAL PURPOSES ONLY.  IF CONFIRMATION IS NEEDED FOR ANY PURPOSE, NOTIFY LAB WITHIN 5 DAYS.  LOWEST DETECTABLE LIMITS FOR URINE DRUG SCREEN Drug Class                     Cutoff (ng/mL) Amphetamine and metabolites    1000 Barbiturate and metabolites    200 Benzodiazepine                 200 Tricyclics and metabolites     300 Opiates and metabolites        300 Cocaine and metabolites        300 THC                            50 Performed at [facility], [address], [place], [place] [address]   . SARS Coronavirus 2 by RT PCR 12/21/2020  NEGATIVE  NEGATIVE Final   Comment: (NOTE) SARS-CoV-2 target nucleic acids are NOT DETECTED.  The SARS-CoV-2 RNA is generally detectable in upper respiratory specimens during the acute phase of infection. The lowest concentration of SARS-CoV-2 viral copies this assay can detect is 138 copies/mL. A negative result does not preclude SARS-Cov-2 infection and should not be used as the sole basis for treatment or other patient  management decisions. A negative result may occur with  improper specimen collection/handling, submission of specimen other than nasopharyngeal swab, presence of viral mutation(s) within the areas targeted by this assay, and inadequate number of viral copies(<138 copies/mL). A negative result must be combined with clinical observations, patient history, and epidemiological information. The expected result is Negative.  Fact Sheet for Patients:  BloggerCourse.com  Fact Sheet for Healthcare Providers:  SeriousBroker.it  This test is no                          t yet approved or cleared by the [place] FDA and  has been authorized for detection and/or diagnosis of SARS-CoV-2 by FDA under an Emergency Use Authorization (EUA). This EUA will remain  in effect (meaning this test can be used) for the duration of the COVID-19 declaration under Section 564(b)(1) of the Act, 21 U.S.C.section 360bbb-3(b)(1), unless the authorization is terminated  or revoked sooner.      . Influenza A by PCR 12/21/2020 NEGATIVE  NEGATIVE Final  . Influenza B by PCR 12/21/2020 NEGATIVE  NEGATIVE Final   Comment: (NOTE) The Xpert Xpress SARS-CoV-2/FLU/RSV plus assay is intended as an aid in the diagnosis of influenza from Nasopharyngeal swab specimens and should not be used as a sole basis for treatment. Nasal washings and aspirates are unacceptable for Xpert Xpress SARS-CoV-2/FLU/RSV testing.  Fact Sheet for Patients: BloggerCourse.com  Fact Sheet for Healthcare Providers: SeriousBroker.it  This test is not yet approved or cleared by the [place] FDA and has been authorized for detection and/or diagnosis of SARS-CoV-2 by FDA under an Emergency Use Authorization (EUA). This EUA will remain in effect (meaning this test can be used) for the duration of the COVID-19 declaration under Section  564(b)(1) of the Act, 21 U.S.C. section 360bbb-3(b)(1), unless the authorization is terminated or revoked.  Performed at [facility], [address] [place], [place] [address]   Admission on 10/22/2020, Discharged on 10/24/2020  Component Date Value Ref Range Status  . Sodium 10/22/2020 140  135 - 145 mmol/L Final  . Potassium 10/22/2020 3.9  3.5 - 5.1 mmol/L Final  . Chloride 10/22/2020 103  98 - 111 mmol/L Final  . CO2 10/22/2020 25  22 - 32 mmol/L Final  . Glucose, Bld 10/22/2020 93  70 - 99 mg/dL Final   Glucose reference range applies only to samples taken after fasting for at least 8 hours.  . BUN 10/22/2020 12  6 - 20 mg/dL Final  . Creatinine, Ser 10/22/2020 1.17  0.61 - 1.24 mg/dL Final  . Calcium 32/95/1884 9.6  8.9 - 10.3 mg/dL Final  . Total Protein 10/22/2020 7.1  6.5 - 8.1 g/dL Final  . Albumin 16/60/6301 4.5  3.5 - 5.0 g/dL Final  . AST 60/07/9322 21  15 - 41 U/L Final  . ALT 10/22/2020 14  0 - 44 U/L Final  . Alkaline Phosphatase 10/22/2020 42  38 - 126 U/L Final  . Total Bilirubin 10/22/2020 0.8  0.3 - 1.2 mg/dL Final  . GFR, Estimated  10/22/2020 >60  >60 mL/min Final   Comment: (NOTE) Calculated using the CKD-EPI Creatinine Equation (2021)   . Anion gap 10/22/2020 12  5 - 15 Final   Performed at [facility], [address] [place], [place] [address]  . Alcohol, Ethyl (B) 10/22/2020 <10  <10 mg/dL Final   Comment: (NOTE) Lowest detectable limit for serum alcohol is 10 mg/dL.  For medical purposes only. Performed at [facility], [address] [place], [place] [address]   . Salicylate Lvl 10/22/2020 <7.0* 7.0 - 30.0 mg/dL Final   Performed at [facility], [address] [place], [place] [address]  . Acetaminophen (Tylenol), Serum 10/22/2020 <10* 10 - 30 ug/mL Final   Comment: (NOTE) Therapeutic concentrations vary significantly. A range of 10-30 ug/mL  may be an effective concentration for many patients. However,  some  are best treated at concentrations outside of this range. Acetaminophen concentrations >150 ug/mL at 4 hours after ingestion  and >50 ug/mL at 12 hours after ingestion are often associated with  toxic reactions.  Performed at [facility], [address] [place], [place] [address]   . WBC 10/22/2020 6.7  4.0 - 10.5 K/uL Final  . RBC 10/22/2020 5.03  4.22 - 5.81 MIL/uL Final  . Hemoglobin 10/22/2020 15.4  13.0 - 17.0 g/dL Final  . HCT 15/17/6160 46.2  39.0 - 52.0 % Final  . MCV 10/22/2020 91.8  80.0 - 100.0 fL Final  . MCH 10/22/2020 30.6  26.0 - 34.0 pg Final  . MCHC 10/22/2020 33.3  30.0 - 36.0 g/dL Final  . RDW 73/71/0626 12.5  11.5 - 15.5 % Final  . Platelets 10/22/2020 282  150 - 400 K/uL Final  . nRBC 10/22/2020 0.0  0.0 - 0.2 % Final   Performed at [facility], [address] [place], [place] [address]  . Opiates 10/22/2020 NONE DETECTED  NONE DETECTED Final  . Cocaine 10/22/2020 NONE DETECTED  NONE DETECTED Final  . Benzodiazepines 10/22/2020 NONE DETECTED  NONE DETECTED Final  . Amphetamines 10/22/2020 NONE DETECTED  NONE DETECTED Final  . Tetrahydrocannabinol 10/22/2020 NONE DETECTED  NONE DETECTED Final  . Barbiturates 10/22/2020 NONE DETECTED  NONE DETECTED Final   Comment: (NOTE) DRUG SCREEN FOR MEDICAL PURPOSES ONLY.  IF CONFIRMATION IS NEEDED FOR ANY PURPOSE, NOTIFY LAB WITHIN 5 DAYS.  LOWEST DETECTABLE LIMITS FOR URINE DRUG SCREEN Drug Class                     Cutoff (ng/mL) Amphetamine and metabolites    1000 Barbiturate and metabolites    200 Benzodiazepine                 200 Tricyclics and metabolites     300 Opiates and metabolites        300 Cocaine and metabolites        300 THC                            50 Performed at [facility], [address] [place], [place] [address]   . SARS Coronavirus 2 by RT PCR 10/23/2020 POSITIVE* NEGATIVE Final   Comment: SHULER RN, AT 1108 10/23/20 D. VANHOOK (NOTE) SARS-CoV-2 target  nucleic acids are DETECTED.  The SARS-CoV-2 RNA is generally detectable in upper respiratory specimens during the acute phase of infection. Positive results are indicative of the presence of  the identified virus, but do not rule out bacterial infection or co-infection with other pathogens not detected by the test. Clinical correlation with patient history and other diagnostic information is necessary to determine patient infection status. The expected result is Negative.  Fact Sheet for Patients: BloggerCourse.com  Fact Sheet for Healthcare Providers: SeriousBroker.it  This test is not yet approved or cleared by the [place] FDA and  has been authorized for detection and/or diagnosis of SARS-CoV-2 by FDA under an Emergency Use Authorization (EUA).  This EUA will remain in effect (meaning this test can be used) for the duration of  the COVID-19 decla                          ration under Section 564(b)(1) of the Act, 21 U.S.C. section 360bbb-3(b)(1), unless the authorization is terminated or revoked sooner.    . Influenza A by PCR 10/23/2020 NEGATIVE  NEGATIVE Final  . Influenza B by PCR 10/23/2020 NEGATIVE  NEGATIVE Final   Comment: (NOTE) The Xpert Xpress SARS-CoV-2/FLU/RSV plus assay is intended as an aid in the diagnosis of influenza from Nasopharyngeal swab specimens and should not be used as a sole basis for treatment. Nasal washings and aspirates are unacceptable for Xpert Xpress SARS-CoV-2/FLU/RSV testing.  Fact Sheet for Patients: BloggerCourse.com  Fact Sheet for Healthcare Providers: SeriousBroker.it  This test is not yet approved or cleared by the [place] FDA and has been authorized for detection and/or diagnosis of SARS-CoV-2 by FDA under an Emergency Use Authorization (EUA). This EUA will remain in effect (meaning this test can be used) for the duration  of the COVID-19 declaration under Section 564(b)(1) of the Act, 21 U.S.C. section 360bbb-3(b)(1), unless the authorization is terminated or revoked.  Performed at [facility], [address] [place], [place] [address]   Admission on 10/06/2020, Discharged on 10/07/2020  Component Date Value Ref Range Status  . SARS Coronavirus 2 by RT PCR 10/06/2020 POSITIVE* NEGATIVE Final   Comment: RESULT CALLED TO, READ BACK BY AND VERIFIED WITH: LOWDERMILK J. 12.29.21 @ 2123 BY MECIAL J.  (NOTE) SARS-CoV-2 target nucleic acids are DETECTED.  The SARS-CoV-2 RNA is generally detectable in upper respiratory specimens during the acute phase of infection. Positive results are indicative of the presence of the identified virus, but do not rule out bacterial infection or co-infection with other pathogens not detected by the test. Clinical correlation with patient history and other diagnostic information is necessary to determine patient infection status. The expected result is Negative.  Fact Sheet for Patients: BloggerCourse.com  Fact Sheet for Healthcare Providers: SeriousBroker.it  This test is not yet approved or cleared by the [place] FDA and  has been authorized for detection and/or diagnosis of SARS-CoV-2 by FDA under an Emergency Use Authorization (EUA).  This EUA will remain in effect (meaning this t                          est can be used) for the duration of  the COVID-19 declaration under Section 564(b)(1) of the Act, 21 U.S.C. section 360bbb-3(b)(1), unless the authorization is terminated or revoked sooner.    . Influenza A by PCR 10/06/2020 NEGATIVE  NEGATIVE Final  . Influenza B by PCR 10/06/2020 NEGATIVE  NEGATIVE Final   Comment: (NOTE) The Xpert Xpress SARS-CoV-2/FLU/RSV plus assay is intended as an aid in the diagnosis of influenza from Nasopharyngeal swab specimens and should  not be used as a sole basis for  treatment. Nasal washings and aspirates are unacceptable for Xpert Xpress SARS-CoV-2/FLU/RSV testing.  Fact Sheet for Patients: BloggerCourse.com  Fact Sheet for Healthcare Providers: SeriousBroker.it  This test is not yet approved or cleared by the [place] FDA and has been authorized for detection and/or diagnosis of SARS-CoV-2 by FDA under an Emergency Use Authorization (EUA). This EUA will remain in effect (meaning this test can be used) for the duration of the COVID-19 declaration under Section 564(b)(1) of the Act, 21 U.S.C. section 360bbb-3(b)(1), unless the authorization is terminated or revoked.  Performed at [facility], [address] [place], [place] [address]   . Sodium 10/06/2020 139  135 - 145 mmol/L Final  . Potassium 10/06/2020 4.0  3.5 - 5.1 mmol/L Final  . Chloride 10/06/2020 102  98 - 111 mmol/L Final  . CO2 10/06/2020 26  22 - 32 mmol/L Final  . Glucose, Bld 10/06/2020 81  70 - 99 mg/dL Final   Glucose reference range applies only to samples taken after fasting for at least 8 hours.  . BUN 10/06/2020 13  6 - 20 mg/dL Final  . Creatinine, Ser 10/06/2020 1.01  0.61 - 1.24 mg/dL Final  . Calcium 40/98/1191 9.6  8.9 - 10.3 mg/dL Final  . Total Protein 10/06/2020 7.6  6.5 - 8.1 g/dL Final  . Albumin 47/82/9562 4.7  3.5 - 5.0 g/dL Final  . AST 13/05/6577 21  15 - 41 U/L Final  . ALT 10/06/2020 15  0 - 44 U/L Final  . Alkaline Phosphatase 10/06/2020 39  38 - 126 U/L Final  . Total Bilirubin 10/06/2020 0.7  0.3 - 1.2 mg/dL Final  . GFR, Estimated 10/06/2020 >60  >60 mL/min Final   Comment: (NOTE) Calculated using the CKD-EPI Creatinine Equation (2021)   . Anion gap 10/06/2020 11  5 - 15 Final   Performed at [facility], [address], [place], [place] [address]  . Alcohol, Ethyl (B) 10/06/2020 <10  <10 mg/dL Final   Comment: (NOTE) Lowest detectable limit for serum  alcohol is 10 mg/dL.  For medical purposes only. Performed at [facility], [address] [place], [place] [address]   . Opiates 10/07/2020 NONE DETECTED  NONE DETECTED Final  . Cocaine 10/07/2020 NONE DETECTED  NONE DETECTED Final  . Benzodiazepines 10/07/2020 NONE DETECTED  NONE DETECTED Final  . Amphetamines 10/07/2020 NONE DETECTED  NONE DETECTED Final  . Tetrahydrocannabinol 10/07/2020 NONE DETECTED  NONE DETECTED Final  . Barbiturates 10/07/2020 NONE DETECTED  NONE DETECTED Final   Comment: (NOTE) DRUG SCREEN FOR MEDICAL PURPOSES ONLY.  IF CONFIRMATION IS NEEDED FOR ANY PURPOSE, NOTIFY LAB WITHIN 5 DAYS.  LOWEST DETECTABLE LIMITS FOR URINE DRUG SCREEN Drug Class                     Cutoff (ng/mL) Amphetamine and metabolites    1000 Barbiturate and metabolites    200 Benzodiazepine                 200 Tricyclics and metabolites     300 Opiates and metabolites        300 Cocaine and metabolites        300 THC                            50 Performed at [facility], [address], [place], [place] [address]   . WBC 10/06/2020 8.0  4.0 - 10.5 K/uL Final  . RBC 10/06/2020 5.36  4.22 - 5.81 MIL/uL Final  . Hemoglobin 10/06/2020 16.6  13.0 - 17.0 g/dL Final  . HCT 60/45/4098 49.3  39.0 - 52.0 % Final  . MCV 10/06/2020 92.0  80.0 - 100.0 fL Final  . MCH 10/06/2020 31.0  26.0 - 34.0 pg Final  . MCHC 10/06/2020 33.7  30.0 - 36.0 g/dL Final  . RDW 11/91/4782 12.7  11.5 - 15.5 % Final  . Platelets 10/06/2020 256  150 - 400 K/uL Final  . nRBC 10/06/2020 0.0  0.0 - 0.2 % Final  . Neutrophils Relative % 10/06/2020 57  % Final  . Neutro Abs 10/06/2020 4.6  1.7 - 7.7 K/uL Final  . Lymphocytes Relative 10/06/2020 30  % Final  . Lymphs Abs 10/06/2020 2.4  0.7 - 4.0 K/uL Final  . Monocytes Relative 10/06/2020 8  % Final  . Monocytes Absolute 10/06/2020 0.7  0.1 - 1.0 K/uL Final  . Eosinophils Relative 10/06/2020 4  % Final  . Eosinophils  Absolute 10/06/2020 0.4  0.0 - 0.5 K/uL Final  . Basophils Relative 10/06/2020 1  % Final  . Basophils Absolute 10/06/2020 0.1  0.0 - 0.1 K/uL Final  . Immature Granulocytes 10/06/2020 0  % Final  . Abs Immature Granulocytes 10/06/2020 0.01  0.00 - 0.07 K/uL Final   Performed at [facility], [address] [place], [place] [address]  Admission on 08/06/2020, Discharged on 08/06/2020  Component Date Value Ref Range Status  . Sodium 08/05/2020 140  135 - 145 mmol/L Final  . Potassium 08/05/2020 4.3  3.5 - 5.1 mmol/L Final  . Chloride 08/05/2020 103  98 - 111 mmol/L Final  . CO2 08/05/2020 28  22 - 32 mmol/L Final  . Glucose, Bld 08/05/2020 113* 70 - 99 mg/dL Final   Glucose reference range applies only to samples taken after fasting for at least 8 hours.  . BUN 08/05/2020 14  6 - 20 mg/dL Final  . Creatinine, Ser 08/05/2020 1.24  0.61 - 1.24 mg/dL Final  . Calcium 30/86/5784 9.4  8.9 - 10.3 mg/dL Final  . Total Protein 08/05/2020 7.3  6.5 - 8.1 g/dL Final  . Albumin 69/62/9528 4.4  3.5 - 5.0 g/dL Final  . AST 41/32/4401 17  15 - 41 U/L Final  . ALT 08/05/2020 14  0 - 44 U/L Final  . Alkaline Phosphatase 08/05/2020 41  38 - 126 U/L Final  . Total Bilirubin 08/05/2020 0.7  0.3 - 1.2 mg/dL Final  . GFR, Estimated 08/05/2020 >60  >60 mL/min Final   Comment: (NOTE) Calculated using the CKD-EPI Creatinine Equation (2021)   . Anion gap 08/05/2020 9  5 - 15 Final   Performed at [facility], [address], [place], [place] [address]  . Alcohol, Ethyl (B) 08/05/2020 <10  <10 mg/dL Final   Comment: (NOTE) Lowest detectable limit for serum alcohol is 10 mg/dL.  For medical purposes only. Performed at [facility], [address] [place], [place] [address]   . Salicylate Lvl 08/05/2020 <7.0* 7.0 - 30.0 mg/dL Final   Performed at [facility], [address], [place], [place] [address]  . Acetaminophen (Tylenol),  Serum 08/05/2020 <10* 10 - 30 ug/mL Final   Comment: (NOTE) Therapeutic concentrations vary significantly. A range of 10-30 ug/mL  may be an effective concentration for many patients.  However, some  are best treated at concentrations outside of this range. Acetaminophen concentrations >150 ug/mL at 4 hours after ingestion  and >50 ug/mL at 12 hours after ingestion are often associated with  toxic reactions.  Performed at [facility], [address] [place], [place] [address]   . WBC 08/05/2020 5.3  4.0 - 10.5 K/uL Final  . RBC 08/05/2020 4.52  4.22 - 5.81 MIL/uL Final  . Hemoglobin 08/05/2020 14.3  13.0 - 17.0 g/dL Final  . HCT 60/45/409810/28/2021 41.9  39.0 - 52.0 % Final  . MCV 08/05/2020 92.7  80.0 - 100.0 fL Final  . MCH 08/05/2020 31.6  26.0 - 34.0 pg Final  . MCHC 08/05/2020 34.1  30.0 - 36.0 g/dL Final  . RDW 11/91/478210/28/2021 13.0  11.5 - 15.5 % Final  . Platelets 08/05/2020 251  150 - 400 K/uL Final  . nRBC 08/05/2020 0.0  0.0 - 0.2 % Final   Performed at [facility], [address] [place], [place] [address]  . Opiates 08/06/2020 NONE DETECTED  NONE DETECTED Final  . Cocaine 08/06/2020 NONE DETECTED  NONE DETECTED Final  . Benzodiazepines 08/06/2020 NONE DETECTED  NONE DETECTED Final  . Amphetamines 08/06/2020 NONE DETECTED  NONE DETECTED Final  . Tetrahydrocannabinol 08/06/2020 POSITIVE* NONE DETECTED Final  . Barbiturates 08/06/2020 NONE DETECTED  NONE DETECTED Final   Comment: (NOTE) DRUG SCREEN FOR MEDICAL PURPOSES ONLY.  IF CONFIRMATION IS NEEDED FOR ANY PURPOSE, NOTIFY LAB WITHIN 5 DAYS.  LOWEST DETECTABLE LIMITS FOR URINE DRUG SCREEN Drug Class                     Cutoff (ng/mL) Amphetamine and metabolites    1000 Barbiturate and metabolites    200 Benzodiazepine                 200 Tricyclics and metabolites     300 Opiates and metabolites        300 Cocaine and metabolites        300 THC                            50 Performed  at [facility], [address] [place], [place] [address]   . SARS Coronavirus 2 by RT PCR 08/06/2020 NEGATIVE  NEGATIVE Final   Comment: (NOTE) SARS-CoV-2 target nucleic acids are NOT DETECTED.  The SARS-CoV-2 RNA is generally detectable in upper respiratoy specimens during the acute phase of infection. The lowest concentration of SARS-CoV-2 viral copies this assay can detect is 131 copies/mL. A negative result does not preclude SARS-Cov-2 infection and should not be used as the sole basis for treatment or other patient management decisions. A negative result may occur with  improper specimen collection/handling, submission of specimen other than nasopharyngeal swab, presence of viral mutation(s) within the areas targeted by this assay, and inadequate number of viral copies (<131 copies/mL). A negative result must be combined with clinical observations, patient history, and epidemiological information. The expected result is Negative.  Fact Sheet for Patients:  https://www.moore.com/https://www.fda.gov/media/142436/download  Fact Sheet for Healthcare Providers:  https://www.young.biz/https://www.fda.gov/media/142435/download  This test is no                          t yet approved or cleared by the [place] FDA and  has been authorized for detection and/or diagnosis of SARS-CoV-2 by FDA under an  Emergency Use Authorization (EUA). This EUA will remain  in effect (meaning this test can be used) for the duration of the COVID-19 declaration under Section 564(b)(1) of the Act, 21 U.S.C. section 360bbb-3(b)(1), unless the authorization is terminated or revoked sooner.    . Influenza A by PCR 08/06/2020 NEGATIVE  NEGATIVE Final  . Influenza B by PCR 08/06/2020 NEGATIVE  NEGATIVE Final   Comment: (NOTE) The Xpert Xpress SARS-CoV-2/FLU/RSV assay is intended as an aid in  the diagnosis of influenza from Nasopharyngeal swab specimens and  should not be used as a sole basis for treatment. Nasal washings  and  aspirates are unacceptable for Xpert Xpress SARS-CoV-2/FLU/RSV  testing.  Fact Sheet for Patients: https://www.moore.com/  Fact Sheet for Healthcare Providers: https://www.young.biz/  This test is not yet approved or cleared by the [place] FDA and  has been authorized for detection and/or diagnosis of SARS-CoV-2 by  FDA under an Emergency Use Authorization (EUA). This EUA will remain  in effect (meaning this test can be used) for the duration of the  Covid-19 declaration under Section 564(b)(1) of the Act, 21  U.S.C. section 360bbb-3(b)(1), unless the authorization is  terminated or revoked. Performed at [facility], [address] [place], [place] [address]   Admission on 07/25/2020, Discharged on 07/26/2020  Component Date Value Ref Range Status  . Sodium 07/25/2020 137  135 - 145 mmol/L Final  . Potassium 07/25/2020 4.0  3.5 - 5.1 mmol/L Final  . Chloride 07/25/2020 100  98 - 111 mmol/L Final  . CO2 07/25/2020 28  22 - 32 mmol/L Final  . Glucose, Bld 07/25/2020 90  70 - 99 mg/dL Final   Glucose reference range applies only to samples taken after fasting for at least 8 hours.  . BUN 07/25/2020 15  6 - 20 mg/dL Final  . Creatinine, Ser 07/25/2020 1.06  0.61 - 1.24 mg/dL Final  . Calcium 77/82/4235 9.4  8.9 - 10.3 mg/dL Final  . Total Protein 07/25/2020 7.7  6.5 - 8.1 g/dL Final  . Albumin 36/14/4315 5.0  3.5 - 5.0 g/dL Final  . AST 40/05/6760 21  15 - 41 U/L Final  . ALT 07/25/2020 16  0 - 44 U/L Final  . Alkaline Phosphatase 07/25/2020 38  38 - 126 U/L Final  . Total Bilirubin 07/25/2020 1.1  0.3 - 1.2 mg/dL Final  . GFR, Estimated 07/25/2020 >60  >60 mL/min Final  . Anion gap 07/25/2020 9  5 - 15 Final   Performed at [facility], [address], [place], [place] [address]  . Alcohol, Ethyl (B) 07/25/2020 <10  <10 mg/dL Final   Comment: (NOTE) Lowest detectable limit for serum alcohol  is 10 mg/dL.  For medical purposes only. Performed at [facility], [address], [place], [place] [address]   . WBC 07/25/2020 7.1  4.0 - 10.5 K/uL Final  . RBC 07/25/2020 4.81  4.22 - 5.81 MIL/uL Final  . Hemoglobin 07/25/2020 15.0  13.0 - 17.0 g/dL Final  . HCT 45/80/9983 43.8  39.0 - 52.0 % Final  . MCV 07/25/2020 91.1  80.0 - 100.0 fL Final  . MCH 07/25/2020 31.2  26.0 - 34.0 pg Final  . MCHC 07/25/2020 34.2  30.0 - 36.0 g/dL Final  . RDW 38/25/0539 13.1  11.5 - 15.5 % Final  . Platelets 07/25/2020 216  150 - 400 K/uL Final  . nRBC 07/25/2020 0.0  0.0 - 0.2 % Final   Performed  at [facility], [address] [place], [place] [address]  . Opiates 07/25/2020 NONE DETECTED  NONE DETECTED Final  . Cocaine 07/25/2020 NONE DETECTED  NONE DETECTED Final  . Benzodiazepines 07/25/2020 NONE DETECTED  NONE DETECTED Final  . Amphetamines 07/25/2020 NONE DETECTED  NONE DETECTED Final  . Tetrahydrocannabinol 07/25/2020 POSITIVE* NONE DETECTED Final  . Barbiturates 07/25/2020 NONE DETECTED  NONE DETECTED Final   Comment: (NOTE) DRUG SCREEN FOR MEDICAL PURPOSES ONLY.  IF CONFIRMATION IS NEEDED FOR ANY PURPOSE, NOTIFY LAB WITHIN 5 DAYS.  LOWEST DETECTABLE LIMITS FOR URINE DRUG SCREEN Drug Class                     Cutoff (ng/mL) Amphetamine and metabolites    1000 Barbiturate and metabolites    200 Benzodiazepine                 200 Tricyclics and metabolites     300 Opiates and metabolites        300 Cocaine and metabolites        300 THC                            50 Performed at [facility], [address] [place], [place] [address]   . SARS Coronavirus 2 by RT PCR 07/25/2020 NEGATIVE  NEGATIVE Final   Comment: (NOTE) SARS-CoV-2 target nucleic acids are NOT DETECTED.  The SARS-CoV-2 RNA is generally detectable in upper respiratoy specimens during the acute phase of infection. The lowest concentration of SARS-CoV-2  viral copies this assay can detect is 131 copies/mL. A negative result does not preclude SARS-Cov-2 infection and should not be used as the sole basis for treatment or other patient management decisions. A negative result may occur with  improper specimen collection/handling, submission of specimen other than nasopharyngeal swab, presence of viral mutation(s) within the areas targeted by this assay, and inadequate number of viral copies (<131 copies/mL). A negative result must be combined with clinical observations, patient history, and epidemiological information. The expected result is Negative.  Fact Sheet for Patients:  https://www.moore.com/  Fact Sheet for Healthcare Providers:  https://www.young.biz/  This test is no                          t yet approved or cleared by the [place] FDA and  has been authorized for detection and/or diagnosis of SARS-CoV-2 by FDA under an Emergency Use Authorization (EUA). This EUA will remain  in effect (meaning this test can be used) for the duration of the COVID-19 declaration under Section 564(b)(1) of the Act, 21 U.S.C. section 360bbb-3(b)(1), unless the authorization is terminated or revoked sooner.    . Influenza A by PCR 07/25/2020 NEGATIVE  NEGATIVE Final  . Influenza B by PCR 07/25/2020 NEGATIVE  NEGATIVE Final   Comment: (NOTE) The Xpert Xpress SARS-CoV-2/FLU/RSV assay is intended as an aid in  the diagnosis of influenza from Nasopharyngeal swab specimens and  should not be used as a sole basis for treatment. Nasal washings and  aspirates are unacceptable for Xpert Xpress SARS-CoV-2/FLU/RSV  testing.  Fact Sheet for Patients: https://www.moore.com/  Fact Sheet for Healthcare Providers: https://www.young.biz/  This test is not yet approved or cleared by the [place] FDA and  has been authorized for detection and/or diagnosis of SARS-CoV-2  by  FDA under an Emergency Use Authorization (EUA). This EUA  will remain  in effect (meaning this test can be used) for the duration of the  Covid-19 declaration under Section 564(b)(1) of the Act, 21  U.S.C. section 360bbb-3(b)(1), unless the authorization is  terminated or revoked. Performed at [facility], [address] [place], [place] [address]     Allergies: Bee pollen, Bee pollen, Shellfish allergy, and Shellfish allergy  PTA Medications: (Not in a hospital admission)   Medical Decision Making  Admission orders placed  Zyprexa 5 mg x 1 dose for paranoia      Recommendations  Based on my evaluation the patient does not appear to have an emergency medical condition.   Patient will be placed in the continuous assessment area at [facility] for treatment and stabilization. He will be reevaluated on 12/28/2020. The treatment team will determine disposition at that time.     Jackelyn Poling, NP 12/28/20  8:05 AM

## 2020-12-28 NOTE — Discharge Summary (Signed)
[name] to be D/C'd home per NP order. Discussed with the patient and all questions fully answered. An After Visit Summary was printed and given to the patient.  Patient escorted out and D/C home. [name]  12/28/2020 3:18 PM

## 2020-12-28 NOTE — Progress Notes (Signed)
Pt refused to answer any TTS questions on first attempt at assessment.  Patient reports that he is willing to answer questions at this time.

## 2020-12-28 NOTE — ED Notes (Signed)
Snack provided

## 2020-12-28 NOTE — ED Notes (Signed)
[place]  

## 2020-12-28 NOTE — Progress Notes (Signed)
Pt continues to sleep. No signs of acute distress noted. Staff will monitor for pt's safety.

## 2020-12-28 NOTE — BH Assessment (Signed)
Comprehensive Clinical Assessment (CCA) Note  12/28/2020 [name] [id number]   Recommendations for Services/Supports/Treatments: Lindon Romp, NP, reviewed pt's chart and information and met with pt and determined pt should be observed overnight for safety and stability and to provide an opportunity to make contact w/ pt's ACT Team.  The patient demonstrates the following risk factors for suicide: Chronic risk factors for suicide include: psychiatric disorder of Schizophrenia and demographic factors (male, >33 y/o). Acute risk factors for suicide include: social withdrawal/isolation. Protective factors for this patient include: positive therapeutic relationship. Considering these factors, the overall suicide risk at this point appears to be none. Patient is appropriate for outpatient follow up.  Therefore, no sitter for suicide precautions is recommended.  [place] ED from 12/28/2020 in [facility] ED from 12/21/2020 in [place] DEPT ED from 08/06/2020 in [place] No Risk No Risk No Risk      Chief Complaint: No chief complaint on file.  Visit Diagnosis: F20.9, Schizophrenia   CCA Screening, Triage and Referral (STR) [name] is a 30 year old patient who was brought to the [facility]) via the police. It 's unclear at this time why pt was brought to the [facility] or whose suggestion it was for pt to come be checked out.  Pt denies SI, HI, AVH, NSSIB, access to guns/weapons, engagement with the legal system, or SA. According to pt's chart, he has acknowledged SA in the past. Pt was unable/unwilling to provide specific information, including where he lives or if he has a job. Pt answered, "I don't know" to most questions posed.  According to pt's chart, he received his Invega injection on 12/23/2020.   Pt declined to provide verbal consent for  clinician to make contact with friends/family for collateral information.  Pt's orientation was UTA. Pt's memory was UTA. Pt was initially irritable during the assessment process and, when met with at a later time, was less irritable but still unwilling/unable to provide answers to questions. Pt's insight, judgement, and impulse control is poor - fair at this time.    Patient Reported Information How did you hear about [place]? Other (Comment) ([employer])  Referral name: [place] Department  Referral phone number: 0 (N/A)   Whom do you see for routine medical problems? I don't have a doctor  Practice/Facility Name: No data recorded Practice/Facility Phone Number: No data recorded Name of Contact: No data recorded Contact Number: No data recorded Contact Fax Number: No data recorded Prescriber Name: No data recorded Prescriber Address (if known): No data recorded  What Is the Reason for Your Visit/Call Today? Pt was brought to the [facility] by the police department. He stated he did not know why he was here.  How Long Has This Been Causing You Problems? 1-6 months  What Do You Feel Would Help You the Most Today? Treatment for Depression or other mood problem   Have You Recently Been in Any Inpatient Treatment (Hospital/Detox/Crisis Center/28-Day Program)? No  Name/Location of Program/Hospital:[facility]  How Long Were You There? Per chart, "2 days."  When Were You Discharged? 02/24/2020 (Per chart.)   Have You Ever Received Services From [facility] Before? No  Who Do You See at [facility]? ED and [place]   Have You Recently Had Any Thoughts About Hurting Yourself? No  Are You Planning to Commit Suicide/Harm Yourself At This time? No   Have you Recently Had Thoughts About Hurting Someone  Else? No  Explanation: No data recorded  Have You Used Any Alcohol or Drugs in the Past 24 Hours? No  How Long Ago Did You Use Drugs or Alcohol? 0000 (Unclear)  What Did You Use and How  Much? Initially admits to ETOH and THC use, now denies   Do You Currently Have a Therapist/Psychiatrist? No  Name of Therapist/Psychiatrist: ACTT Team - agency name unknown   Have You Been Recently Discharged From Any Office Practice or Programs? No  Explanation of Discharge From Practice/Program: No data recorded    CCA Screening Triage Referral Assessment Type of Contact: Face-to-Face  Is this Initial or Reassessment? Initial Assessment  Date Telepsych consult ordered in [facility]:  10/23/2020  Time Telepsych consult ordered in [facility]:  [place]   Patient Reported Information Reviewed? Yes  Patient Left Without Being Seen? No data recorded Reason for Not Completing Assessment: TTS cart currently in use, 2 pt's ahead of this consult. TTS to assess pt when cart is available    Collateral Involvement: Pt declined to provide consent for clinician to make contact with friends/family members for collateral information.   Does Patient Have a Stage manager Guardian? No data recorded Name and Contact of Legal Guardian: [name], legal guardian.   If Minor and Not Living with Parent(s), Who has Custody? N/A  Is CPS involved or ever been involved? Never  Is APS involved or ever been involved? Never   Patient Determined To Be At Risk for Harm To Self or Others Based on Review of Patient Reported Information or Presenting Complaint? Yes, for Self-Harm  Method: No Plan  Availability of Means: No access or NA  Intent: Vague intent or NA  Notification Required: No need or identified person  Additional Information for Danger to Others Potential: Active psychosis  Additional Comments for Danger to Others Potential: History of children playing outside triggering patient.  Are There Guns or Other Weapons in [place]? No  Types of Guns/Weapons: No data recorded Are These Weapons Safely Secured?                            No data recorded Who Could Verify You Are Able To Have  These Secured: No data recorded Do You Have any Outstanding Charges, Pending Court Dates, Parole/Probation? None noted  Contacted To Inform of Risk of Harm To Self or Others: Law Enforcement ([employer] is aware of pt's mental health concerns)   Location of Assessment: [facility] Services   Does Patient Present under Involuntary Commitment? No  IVC Papers Initial File Date: 10/22/2020   [place] of Residence: [place]   Patient Currently Receiving the Following Services: ACTT Architect)   Determination of Need: Urgent (48 hours)   Options For Referral: [facility]     CCA Biopsychosocial Intake/Chief Complaint:  "I am scared for my life", no additional information shared by patient.  Current Symptoms/Problems: Patient unable to identify concerns.   Patient Reported Schizophrenia/Schizoaffective Diagnosis in Past: Yes   Strengths: Not assessed  Preferences: Not assessed  Abilities: Not assessed   Type of Services Patient Feels are Needed: Continue with ACTT services   Initial Clinical Notes/Concerns: N/A   Mental Health Symptoms Depression:  None   Duration of Depressive symptoms: -- (uta)   Mania:  Increased Energy; Irritability   Anxiety:   Restlessness; Worrying   Psychosis:  Delusions   Duration of Psychotic symptoms: Greater than six months  Trauma:  N/A   Obsessions:  None   Compulsions:  None   Inattention:  None   Hyperactivity/Impulsivity:  N/A   Oppositional/Defiant Behaviors:  N/A; None   Emotional Irregularity:  Mood lability; Potentially harmful impulsivity   Other Mood/Personality Symptoms:  None noted    Mental Status Exam Appearance and self-care  Stature:  Average   Weight:  Average weight   Clothing:  Casual   Grooming:  Normal   Cosmetic use:  None   Posture/gait:  Normal   Motor activity:  Not Remarkable   Sensorium  Attention:  Inattentive   Concentration:  Normal    Orientation:  -- (UTA)   Recall/memory:  Defective in Immediate; Defective in Short-term; Defective in Recent; Defective in Remote   Affect and Mood  Affect:  Flat; Anxious   Mood:  Anxious   Relating  Eye contact:  Normal   Facial expression:  Constricted   Attitude toward examiner:  Guarded   Thought and Language  Speech flow: Blocked   Thought content:  Delusions   Preoccupation:  None   Hallucinations:  Other (Comment) (Pt denies)   Organization:  No data recorded  [employer] of Knowledge:  Fair Special educational needs teacher)   Intelligence:  Needs investigation   Abstraction:  -- (UTA)   Judgement:  Impaired   Reality Testing:  Variable   Insight:  Lacking   Decision Making:  Impulsive   Social Functioning  Social Maturity:  Irresponsible   Social Judgement:  Naive   Stress  Stressors:  -- (UTA)   Coping Ability:  Overwhelmed; Deficient supports   Skill Deficits:  Decision making; Responsibility; Self-control; Interpersonal   Supports:  Friends/Service system     Religion: Religion/Spirituality Are You A Religious Person?: No  Leisure/Recreation: Leisure / Recreation Do You Have Hobbies?: No  Exercise/Diet: Exercise/Diet Do You Exercise?: No Have You Gained or Lost A Significant Amount of Weight in the Past Six Months?: No Do You Follow a Special Diet?: No Do You Have Any Trouble Sleeping?: No   CCA Employment/Education Employment/Work Situation: Employment / Work Situation Employment situation: Unemployed Special educational needs teacher) Patient's job has been impacted by current illness:  (UTA) What is the longest time patient has a held a job?: Not assessed Where was the patient employed at that time?: Not assessed Has patient ever been in the [employer]?: No (Per chart.)  Education: Education Is Patient Currently Attending School?: No Last Grade Completed: 9 (Per chart, 9th or 10th grade.) Name of High School: Not assessed Did Teacher, adult education From [employer]?:  No (Per chart.) Did You Attend College?: No (Per chart.) Did You Attend Graduate School?: No (Per chart.) Did You Have Any Special Interests In School?: Not assessed Did You Have An Individualized Education Program (IIEP):  (Not assessed) Did You Have Any Difficulty At School?:  (Not assessed) Patient's Education Has Been Impacted by Current Illness:  (Not assessed)   CCA Family/Childhood History Family and Relationship History: Family history Marital status: Single Are you sexually active?:  (Not assessed) What is your sexual orientation?: Not assessed Has your sexual activity been affected by drugs, alcohol, medication, or emotional stress?: Not assessed Does patient have children?:  (Not assessed)  Childhood History:  Childhood History By whom was/is the patient raised?:  (Not assessed) Additional childhood history information: Not assessed Description of patient's relationship with caregiver when they were a child: Not assessed Patient's description of current relationship with people who raised him/her: Not assessed How were you disciplined  when you got in trouble as a child/adolescent?: Not assessed Does patient have siblings?:  (Not assessed) Did patient suffer any verbal/emotional/physical/sexual abuse as a child?:  (Not assessed) Did patient suffer from severe childhood neglect?:  (Not assessed) Has patient ever been sexually abused/assaulted/raped as an adolescent or adult?:  (Not assessed) Was the patient ever a victim of a crime or a disaster?: No Witnessed domestic violence?: No Has patient been affected by domestic violence as an adult?: No  Child/Adolescent Assessment:     CCA Substance Use Alcohol/Drug Use: Alcohol / Drug Use Pain Medications: see MAR Prescriptions: see MAR Over the Counter: see MAR History of alcohol / drug use?:  (Denies, though has admitted SA in the past) Longest period of sobriety (when/how long): Unknown Negative Consequences of Use:   (Not assessed) Withdrawal Symptoms:  (Denies)                         ASAM's:  Six Dimensions of Multidimensional Assessment  Dimension 1:  Acute Intoxication and/or Withdrawal Potential:      Dimension 2:  Biomedical Conditions and Complications:      Dimension 3:  Emotional, Behavioral, or Cognitive Conditions and Complications:     Dimension 4:  Readiness to Change:     Dimension 5:  Relapse, Continued use, or Continued Problem Potential:     Dimension 6:  Recovery/Living Environment:     ASAM Severity Score:    ASAM Recommended Level of Treatment: ASAM Recommended Level of Treatment: Level I Outpatient Treatment   Substance use Disorder (SUD) Substance Use Disorder (SUD)  Checklist Symptoms of Substance Use:  (Pt denies SA)  Recommendations for Services/Supports/Treatments: Recommendations for Services/Supports/Treatments Recommendations For Services/Supports/Treatments: Individual Therapy,Medication Management (Continuing Observation Unit)  Lindon Romp, NP, reviewed pt's chart and information and met with pt and determined pt should be observed overnight for safety and stability and to provide an opportunity to make contact w/ pt's ACT Team.   DSM5 Diagnoses: Patient Active Problem List   Diagnosis Date Noted  . Schizophrenia, paranoid type ([place]) 10/24/2020  . Other schizophrenia ([place]) 02/14/2018  . Cannabis use disorder, moderate, dependence ([place]) 05/10/2016    Patient Centered Plan: Patient is on the following Treatment Plan(s):  Anxiety and Impulse Control   Referrals to Alternative Service(s): Referred to Alternative Service(s):   Place:   Date:   Time:    Referred to Alternative Service(s):   Place:   Date:   Time:    Referred to Alternative Service(s):   Place:   Date:   Time:    Referred to Alternative Service(s):   Place:   Date:   Time:     Dannielle Burn, LMFT

## 2020-12-28 NOTE — ED Notes (Signed)
Skin assessment performed with [name], MHT assisting.  Patient escorted to flex bed #4.  Denies SI, HI, AVH.  No further needs or concerns apparent at this time. Nursing will continue to monitor.

## 2020-12-28 NOTE — ED Provider Notes (Signed)
FBC/OBS ASAP Discharge Summary  Date and Time: 12/28/2020 2:44 PM  Name: [name]  MRN:  [id number]   Discharge Diagnoses:  Final diagnoses:  Schizophrenia, paranoid (HCC)  Homelessness    Subjective: Patient reports today that he is feeling okay.  He states that he slept well since he arrived at the [facility].  Patient states that he still feels like somebody is out to get him but he does not know why or who it is.  Patient denies any suicidal or homicidal ideations and denies any hallucinations.  Patient is offered options for safer places to go other than back to the street.  Ultimately the patient decided he did not want to go to a shelter or to the [facility] rescue mission and requested to be discharged to the street.  Patient was instructed to follow-up with his psych services.  Stay Summary: Patient is a 30 year old male with a history of schizophrenia who presented to the [facility] voluntarily due to paranoia thinking that someone is out to get him and trying to kill him.  Patient is currently with ACT services and receives [place] 117 mg IM injection C last received on 12/23/2020.  Patient was admitted to the continuous observation unit for overnight assessment and restarted on home medications.  Today the patient reports that he is not suicidal nor homicidal and denies any hallucinations but states he still feels that somebody is coming after him.  Patient has been calm and cooperative throughout the day and is slept most of the day.  Patient has ACT services with [facility] and is instructed to contact them.  Patient is offered housing options such as shelters or [employer] and has refused in route requested to be discharged back to the street.  Patient does not meet any inpatient psychiatric treatment criteria.  Total Time spent with patient: 30 minutes  Past Psychiatric History: Schizophrenia, MDD, numerous ED visits, substance abuse  Past Medical History:  Past Medical  History:  Diagnosis Date  . Depression   . Schizophrenia [facility])     Past Surgical History:  Procedure Laterality Date  . BACK SURGERY     Family History: No family history on file. Family Psychiatric History: None reported Social History:  Social History   Substance and Sexual Activity  Alcohol Use No     Social History   Substance and Sexual Activity  Drug Use Yes  . Types: Marijuana   Comment: rare    Social History   Socioeconomic History  . Marital status: Single    Spouse name: Not on file  . Number of children: Not on file  . Years of education: Not on file  . Highest education level: Not on file  Occupational History  . Occupation: unemployed  Tobacco Use  . Smoking status: Current Some Day Smoker    Types: Cigarettes  . Smokeless tobacco: Never Used  Vaping Use  . Vaping Use: Never used  Substance and Sexual Activity  . Alcohol use: No  . Drug use: Yes    Types: Marijuana    Comment: rare  . Sexual activity: Yes    Birth control/protection: Condom  Other Topics Concern  . Not on file  Social History Narrative   ** Merged History Encounter **       UTA certain topics - tangential thought process, cannot answer clearly. Pt not sure about school; reportedly is homeless.    Social Determinants of Health   Financial Resource Strain: Not  on file  Food Insecurity: Not on file  Transportation Needs: Not on file  Physical Activity: Not on file  Stress: Not on file  Social Connections: Not on file   SDOH:  SDOH Screenings   Alcohol Screen: Low Risk   . Last Alcohol Screening Score (AUDIT): 0  Depression (PHQ2-9): Medium Risk  . PHQ-2 Score: 6  Financial Resource Strain: Not on file  Food Insecurity: Not on file  Housing: Not on file  Physical Activity: Not on file  Social Connections: Not on file  Stress: Not on file  Tobacco Use: High Risk  . Smoking Tobacco Use: Current Some Day Smoker  . Smokeless Tobacco Use: Never Used  Transportation  Needs: Not on file    Has this patient used any form of tobacco in the last 30 days? (Cigarettes, Smokeless Tobacco, Cigars, and/or Pipes) A prescription for an FDA-approved tobacco cessation medication was offered at discharge and the patient refused  Current Medications:  Current Facility-Administered Medications  Medication Dose Route Frequency Provider Last Rate Last Admin  . acetaminophen (TYLENOL) tablet 650 mg  650 mg Oral Q6H PRN Jackelyn Poling, NP      . alum & mag hydroxide-simeth (MAALOX/MYLANTA) 200-200-20 MG/5ML suspension 30 mL  30 mL Oral Q4H PRN [name] A, NP      . hydrOXYzine (ATARAX/VISTARIL) tablet 25 mg  25 mg Oral TID PRN [name] A, NP      . magnesium hydroxide (MILK OF MAGNESIA) suspension 30 mL  30 mL Oral Daily PRN Jackelyn Poling, NP       No current outpatient medications on file.    PTA Medications: (Not in a hospital admission)   Musculoskeletal  Strength & Muscle Tone: within normal limits Gait & Station: normal Patient leans: N/A  Psychiatric Specialty Exam  Presentation  General Appearance: Appropriate for Environment; Casual  Eye Contact:Good  Speech:Clear and Coherent; Normal Rate  Speech Volume:Normal  Handedness:Right   Mood and Affect  Mood:Anxious  Affect:Appropriate; Congruent   Thought Process  Thought Processes:Coherent  Descriptions of Associations:Intact  Orientation:Full (Time, Place and Person)  Thought Content:WDL  Diagnosis of Schizophrenia or Schizoaffective disorder in past: Yes  Duration of Psychotic Symptoms: Greater than six months   Hallucinations:Hallucinations: None  Ideas of Reference:None  Suicidal Thoughts:Suicidal Thoughts: No  Homicidal Thoughts:Homicidal Thoughts: No   Sensorium  Memory:Immediate Good; Recent Good; Remote Good  Judgment:Intact  Insight:Good   Executive Functions  Concentration:Good  Attention Span:Good  Recall:Good  Fund of  Knowledge:Good  Language:Good   Psychomotor Activity  Psychomotor Activity:Psychomotor Activity: Normal   Assets  Assets:Communication Skills; Desire for Improvement; Financial Resources/Insurance; Physical Health   Sleep  Sleep:Sleep: Good   Nutritional Assessment (For OBS and FBC admissions only) Has the patient had a weight loss or gain of 10 pounds or more in the last 3 months?: No Has the patient had a decrease in food intake/or appetite?: No Does the patient have dental problems?: No Does the patient have eating habits or behaviors that may be indicators of an eating disorder including binging or inducing vomiting?: No Has the patient recently lost weight without trying?: No Has the patient been eating poorly because of a decreased appetite?: No Malnutrition Screening Tool Score: 0    Physical Exam  Physical Exam Vitals and nursing note reviewed.  Constitutional:      Appearance: He is well-developed.  HENT:     Head: Normocephalic.  Eyes:     Pupils: Pupils are equal,  round, and reactive to light.  Cardiovascular:     Rate and Rhythm: Normal rate.  Pulmonary:     Effort: Pulmonary effort is normal.  Musculoskeletal:        General: Normal range of motion.  Neurological:     Mental Status: He is alert and oriented to person, place, and time.    Review of Systems  Constitutional: Negative.   HENT: Negative.   Eyes: Negative.   Respiratory: Negative.   Cardiovascular: Negative.   Gastrointestinal: Negative.   Genitourinary: Negative.   Musculoskeletal: Negative.   Skin: Negative.   Neurological: Negative.   Endo/Heme/Allergies: Negative.   Psychiatric/Behavioral: Negative.    Blood pressure 136/75, pulse 60, temperature 97.7 F (36.5 C), temperature source Oral, resp. rate 18, SpO2 100 %. There is no height or weight on file to calculate BMI.  Demographic Factors:  Male and Low socioeconomic status  Loss Factors: NA  Historical  Factors: NA  Risk Reduction Factors:   Living with another person, especially a relative and Positive social support  Continued Clinical Symptoms:  Alcohol/Substance Abuse/Dependencies Previous Psychiatric Diagnoses and Treatments  Cognitive Features That Contribute To Risk:  None    Suicide Risk:  Minimal: No identifiable suicidal ideation.  Patients presenting with no risk factors but with morbid ruminations; may be classified as minimal risk based on the severity of the depressive symptoms  Plan Of Care/Follow-up recommendations:  Continue activity as tolerated. Continue diet as recommended by your PCP. Ensure to keep all appointments with outpatient providers.  Disposition: Discharge to shelter  Maryfrances Bunnell, FNP 12/28/2020, 2:44 PM

## 2020-12-29 ENCOUNTER — Emergency Department (HOSPITAL_COMMUNITY)
Admission: EM | Admit: 2020-12-29 | Discharge: 2020-12-29 | Disposition: A | Discharge status: Home / Self Care | Payer: Medicaid Other | Attending: Emergency Medicine | Admitting: Emergency Medicine

## 2020-12-29 ENCOUNTER — Encounter (HOSPITAL_COMMUNITY): Payer: Self-pay | Admitting: *Deleted

## 2020-12-29 ENCOUNTER — Other Ambulatory Visit: Payer: Self-pay

## 2020-12-29 ENCOUNTER — Emergency Department (HOSPITAL_COMMUNITY)
Admission: EM | Admit: 2020-12-29 | Discharge: 2020-12-30 | Disposition: A | Discharge status: Home / Self Care | Payer: Medicaid - Out of State | Attending: Emergency Medicine | Admitting: Emergency Medicine

## 2020-12-29 DIAGNOSIS — Z20822 Contact with and (suspected) exposure to covid-19: Secondary | ICD-10-CM | POA: Insufficient documentation

## 2020-12-29 DIAGNOSIS — F1290 Cannabis use, unspecified, uncomplicated: Secondary | ICD-10-CM | POA: Diagnosis present

## 2020-12-29 DIAGNOSIS — F22 Delusional disorders: Secondary | ICD-10-CM | POA: Insufficient documentation

## 2020-12-29 DIAGNOSIS — F200 Paranoid schizophrenia: Secondary | ICD-10-CM | POA: Diagnosis not present

## 2020-12-29 DIAGNOSIS — R41840 Attention and concentration deficit: Secondary | ICD-10-CM

## 2020-12-29 DIAGNOSIS — F17210 Nicotine dependence, cigarettes, uncomplicated: Secondary | ICD-10-CM | POA: Diagnosis not present

## 2020-12-29 LAB — CBC
HCT: 40.9 % (ref 39.0–52.0)
Hemoglobin: 13.9 g/dL (ref 13.0–17.0)
MCH: 31 pg (ref 26.0–34.0)
MCHC: 34 g/dL (ref 30.0–36.0)
MCV: 91.1 fL (ref 80.0–100.0)
Platelets: 251 10*3/uL (ref 150–400)
RBC: 4.49 MIL/uL (ref 4.22–5.81)
RDW: 12.7 % (ref 11.5–15.5)
WBC: 5.8 10*3/uL (ref 4.0–10.5)
nRBC: 0 % (ref 0.0–0.2)

## 2020-12-29 NOTE — ED Triage Notes (Signed)
Pt requesting medication to help "get right with reality" Pt denies hallucinations/ SI/ HI. Chart reports schizophrenia, pt denies

## 2020-12-29 NOTE — ED Provider Notes (Signed)
[facility]-EMERGENCY DEPT Provider Note   CSN: [id number] Arrival date & time: 12/28/20  2312     History No chief complaint on file.   [name] is a 30 y.o. male.  Patient with past medical history notable for schizophrenia and depression presents to the emergency department with chief complaint of difficulty focusing on reality.  He states he has had difficulty focusing for a while.  He states that he needs to be on different medications.  He denies SI or HI.  Denies drugs or substance abuse.  Denies any other issues tonight.  The history is provided by the patient. No language interpreter was used.       Past Medical History:  Diagnosis Date  . Depression   . Schizophrenia [facility])     Patient Active Problem List   Diagnosis Date Noted  . Schizophrenia, paranoid type (HCC) 10/24/2020  . Other schizophrenia (HCC) 02/14/2018  . Cannabis use disorder, moderate, dependence (HCC) 05/10/2016    Past Surgical History:  Procedure Laterality Date  . BACK SURGERY         No family history on file.  Social History   Tobacco Use  . Smoking status: Current Some Day Smoker    Types: Cigarettes  . Smokeless tobacco: Never Used  Vaping Use  . Vaping Use: Never used  Substance Use Topics  . Alcohol use: No  . Drug use: Yes    Types: Marijuana    Comment: rare    Home Medications Prior to Admission medications   Not on File    Allergies    Bee pollen, Bee pollen, Shellfish allergy, and Shellfish allergy  Review of Systems   Review of Systems  All other systems reviewed and are negative.   Physical Exam Updated Vital Signs BP (!) 139/92 (BP Location: Left Arm)   Pulse 83   Temp 98.4 F (36.9 C) (Oral)   Resp 19   Ht 5\' 7"  (1.702 m)   Wt 70.3 kg   SpO2 100%   BMI 24.28 kg/m   Physical Exam Vitals and nursing note reviewed.  Constitutional:      Appearance: He is well-developed.  HENT:     Head: Normocephalic and atraumatic.   Eyes:     Conjunctiva/sclera: Conjunctivae normal.  Cardiovascular:     Rate and Rhythm: Normal rate and regular rhythm.     Heart sounds: No murmur heard.   Pulmonary:     Effort: Pulmonary effort is normal. No respiratory distress.     Breath sounds: Normal breath sounds.  Abdominal:     Palpations: Abdomen is soft.     Tenderness: There is no abdominal tenderness.  Musculoskeletal:        General: Normal range of motion.     Cervical back: Neck supple.  Skin:    General: Skin is warm and dry.  Neurological:     Mental Status: He is alert.  Psychiatric:        Mood and Affect: Mood normal.     ED Results / Procedures / Treatments   Labs (all labs ordered are listed, but only abnormal results are displayed) Labs Reviewed - No data to display  EKG None  Radiology No results found.  Procedures Procedures   Medications Ordered in ED Medications - No data to display  ED Course  I have reviewed the triage vital signs and the nursing notes.  Pertinent labs & imaging results that were available during my care of  the patient were reviewed by me and considered in my medical decision making (see chart for details).    MDM Rules/Calculators/A&P                          Patient with history of depression and schizophrenia stating that he is having trouble focusing on reality.  No SI or HI.  Patient does not appear to be a danger to himself or others.  Will give resources for outpatient follow-up. Final Clinical Impression(s) / ED Diagnoses Final diagnoses:  Concentration deficit    Rx / DC Orders ED Discharge Orders    None       Roxy Horseman, PA-C 12/29/20 0109    Melene Plan, DO 12/29/20 0115

## 2020-12-30 ENCOUNTER — Emergency Department (HOSPITAL_COMMUNITY)
Admission: EM | Admit: 2020-12-30 | Discharge: 2020-12-31 | Disposition: A | Discharge status: Home / Self Care | Payer: Medicaid - Out of State | Source: Home / Self Care | Attending: Emergency Medicine | Admitting: Emergency Medicine

## 2020-12-30 ENCOUNTER — Encounter (HOSPITAL_COMMUNITY): Payer: Self-pay

## 2020-12-30 DIAGNOSIS — F17210 Nicotine dependence, cigarettes, uncomplicated: Secondary | ICD-10-CM | POA: Insufficient documentation

## 2020-12-30 DIAGNOSIS — F22 Delusional disorders: Secondary | ICD-10-CM | POA: Insufficient documentation

## 2020-12-30 DIAGNOSIS — Z20822 Contact with and (suspected) exposure to covid-19: Secondary | ICD-10-CM | POA: Insufficient documentation

## 2020-12-30 LAB — COMPREHENSIVE METABOLIC PANEL
ALT: 18 U/L (ref 0–44)
ALT: 17 U/L (ref 0–44)
AST: 28 U/L (ref 15–41)
AST: 22 U/L (ref 15–41)
Albumin: 4.1 g/dL (ref 3.5–5.0)
Albumin: 4.1 g/dL (ref 3.5–5.0)
Alkaline Phosphatase: 42 U/L (ref 38–126)
Alkaline Phosphatase: 43 U/L (ref 38–126)
Anion gap: 6 (ref 5–15)
Anion gap: 9 (ref 5–15)
BUN: 13 mg/dL (ref 6–20)
BUN: 14 mg/dL (ref 6–20)
CO2: 26 mmol/L (ref 22–32)
CO2: 28 mmol/L (ref 22–32)
Calcium: 9.3 mg/dL (ref 8.9–10.3)
Calcium: 9.6 mg/dL (ref 8.9–10.3)
Chloride: 105 mmol/L (ref 98–111)
Chloride: 101 mmol/L (ref 98–111)
Creatinine, Ser: 1.05 mg/dL (ref 0.61–1.24)
Creatinine, Ser: 1.08 mg/dL (ref 0.61–1.24)
GFR, Estimated: >60 mL/min (ref >60)
GFR, Estimated: >60 mL/min (ref >60)
Glucose, Bld: 99 mg/dL (ref 70–99)
Glucose, Bld: 120 mg/dL — ABNORMAL HIGH (ref 70–99)
Potassium: 4.4 mmol/L (ref 3.5–5.1)
Potassium: 4.4 mmol/L (ref 3.5–5.1)
Sodium: 137 mmol/L (ref 135–145)
Sodium: 138 mmol/L (ref 135–145)
Total Bilirubin: 1 mg/dL (ref 0.3–1.2)
Total Bilirubin: 0.7 mg/dL (ref 0.3–1.2)
Total Protein: 6.4 g/dL — ABNORMAL LOW (ref 6.5–8.1)
Total Protein: 6.6 g/dL (ref 6.5–8.1)

## 2020-12-30 LAB — RAPID URINE DRUG SCREEN, HOSP PERFORMED
Amphetamines: NOT DETECTED
Amphetamines: NOT DETECTED
Barbiturates: NOT DETECTED
Barbiturates: NOT DETECTED
Benzodiazepines: NOT DETECTED
Benzodiazepines: NOT DETECTED
Cocaine: NOT DETECTED
Cocaine: NOT DETECTED
Opiates: NOT DETECTED
Opiates: NOT DETECTED
Tetrahydrocannabinol: NOT DETECTED
Tetrahydrocannabinol: NOT DETECTED

## 2020-12-30 LAB — CBC
HCT: 41 % (ref 39.0–52.0)
Hemoglobin: 14.4 g/dL (ref 13.0–17.0)
MCH: 31.6 pg (ref 26.0–34.0)
MCHC: 35.1 g/dL (ref 30.0–36.0)
MCV: 89.9 fL (ref 80.0–100.0)
Platelets: 256 10*3/uL (ref 150–400)
RBC: 4.56 MIL/uL (ref 4.22–5.81)
RDW: 12.9 % (ref 11.5–15.5)
WBC: 6.4 10*3/uL (ref 4.0–10.5)
nRBC: 0 % (ref 0.0–0.2)

## 2020-12-30 LAB — ETHANOL
Alcohol, Ethyl (B): <10 mg/dL (ref <10)
Alcohol, Ethyl (B): <10 mg/dL (ref <10)

## 2020-12-30 NOTE — Discharge Instructions (Addendum)
You need to follow up with your ACT Team at [facility]. They can adjust your medications as needed.

## 2020-12-30 NOTE — ED Triage Notes (Signed)
Pt states "I am fearing for my life". Denies any visual and auditory hallucinations. Denies Si/HI.   Pt appears anxious in triage. Redirectable.

## 2020-12-30 NOTE — ED Notes (Signed)
Pt declined vitals for d/c. States he needs medications. Strongly encouraged to follow up with [facility] in AM. Provided with bag lunch and other snacks.

## 2020-12-30 NOTE — ED Provider Notes (Signed)
[facility] EMERGENCY DEPARTMENT Provider Note   CSN: [id number] Arrival date & time: 12/29/20  2309   History Chief Complaint  Patient presents with  . Medical Clearance    [name] is a 30 y.o. male.  The history is provided by the patient. The history is limited by the condition of the patient (Psychiatric disorder).  He has history of paranoid schizophrenia, depression and comes in stating that he needs medicine to help him connect to reality.  He does admit to marijuana use but denies ethanol use.  He denies homicidal and suicidal ideation and denies hallucinations and denies paranoid ideation.  He had recently been seen at behavioral health urgent care and was supposed to follow-up with his ACT team, but has not done so.  Past Medical History:  Diagnosis Date  . Depression   . Schizophrenia [facility])     Patient Active Problem List   Diagnosis Date Noted  . Schizophrenia, paranoid type (HCC) 10/24/2020  . Other schizophrenia (HCC) 02/14/2018  . Cannabis use disorder, moderate, dependence (HCC) 05/10/2016    Past Surgical History:  Procedure Laterality Date  . BACK SURGERY         No family history on file.  Social History   Tobacco Use  . Smoking status: Current Some Day Smoker    Types: Cigarettes  . Smokeless tobacco: Never Used  Vaping Use  . Vaping Use: Never used  Substance Use Topics  . Alcohol use: No  . Drug use: Yes    Types: Marijuana    Comment: rare    Home Medications Prior to Admission medications   Not on File    Allergies    Bee pollen, Bee pollen, Shellfish allergy, and Shellfish allergy  Review of Systems   Review of Systems  Unable to perform ROS: Psychiatric disorder    Physical Exam Updated Vital Signs BP 115/77 (BP Location: Left Arm)   Pulse 77   Temp 98.6 F (37 C) (Oral)   Resp 17   SpO2 98%   Physical Exam Vitals and nursing note reviewed.   30 year old male, resting comfortably and in no  acute distress. Vital signs are normal. Oxygen saturation is 98%, which is normal. Head is normocephalic and atraumatic. PERRLA, EOMI. Oropharynx is clear. Neck is nontender and supple without adenopathy or JVD. Back is nontender and there is no CVA tenderness. Lungs are clear without rales, wheezes, or rhonchi. Chest is nontender. Heart has regular rate and rhythm without murmur. Abdomen is soft, flat, nontender without masses or hepatosplenomegaly and peristalsis is normoactive. Extremities have no cyanosis or edema, full range of motion is present. Skin is warm and dry without rash. Neurologic: Awake and alert, cranial nerves are intact, there are no motor or sensory deficits. Psychiatric: Slightly flat affect.  Does not appear to be responding to internal stimuli.  ED Results / Procedures / Treatments   Labs (all labs ordered are listed, but only abnormal results are displayed) Labs Reviewed  COMPREHENSIVE METABOLIC PANEL - Abnormal; Notable for the following components:      Result Value   Total Protein 6.4 (*)    All other components within normal limits  ETHANOL  CBC  RAPID URINE DRUG SCREEN, HOSP PERFORMED   Procedures Procedures   Medications Ordered in ED Medications - No data to display  ED Course  I have reviewed the triage vital signs and the nursing notes.  Pertinent labs & imaging results that were available during  my care of the patient were reviewed by me and considered in my medical decision making (see chart for details).  MDM Rules/Calculators/A&P Paranoid schizophrenia.  Old records reviewed, and he had been seen at behavioral health urgent care on 3/22  with similar complaints, referred to ACT Team with [facility].  He then was seen at [facility] Emergency Department on 3/23.  Currently, patient does not appear to have any condition requiring inpatient care.  He is not homicidal or suicidal and is not having active hallucinations.  He does  need to follow-up with his ACT Team at [facility].  Final Clinical Impression(s) / ED Diagnoses Final diagnoses:  None    Rx / DC Orders ED Discharge Orders    None       Dione Booze, MD 12/30/20 [phone]

## 2020-12-31 ENCOUNTER — Telehealth (HOSPITAL_COMMUNITY): Payer: Self-pay | Admitting: Internal Medicine

## 2020-12-31 LAB — RESP PANEL BY RT-PCR (FLU A&B, COVID) ARPGX2
Influenza A by PCR: NEGATIVE
Influenza B by PCR: NEGATIVE
SARS Coronavirus 2 by RT PCR: NEGATIVE

## 2020-12-31 MED ORDER — ALUM & MAG HYDROXIDE-SIMETH 200-200-20 MG/5ML PO SUSP: 30.0000 mL | Freq: Four times a day (QID) | ORAL | Status: DC | PRN

## 2020-12-31 MED ORDER — LORAZEPAM 1 MG PO TABS
0.0000 mg | ORAL_TABLET | Freq: Four times a day (QID) | ORAL | Status: DC
  Administered 2020-12-31: 2 mg via ORAL
  Filled 2020-12-31: qty 2

## 2020-12-31 MED ORDER — LORAZEPAM 1 MG PO TABS: 0.0000 mg | ORAL_TABLET | Freq: Two times a day (BID) | ORAL | Status: DC

## 2020-12-31 MED ORDER — LORAZEPAM 2 MG/ML IJ SOLN: 0.0000 mg | Freq: Four times a day (QID) | INTRAMUSCULAR | Status: DC

## 2020-12-31 MED ORDER — LORAZEPAM 2 MG/ML IJ SOLN: 0.0000 mg | Freq: Two times a day (BID) | INTRAMUSCULAR | Status: DC

## 2020-12-31 MED ORDER — THIAMINE HCL 100 MG PO TABS: 100.0000 mg | ORAL_TABLET | Freq: Every day | ORAL | Status: DC

## 2020-12-31 MED ORDER — NICOTINE 21 MG/24HR TD PT24: 21.0000 mg | MEDICATED_PATCH | Freq: Every day | TRANSDERMAL | Status: DC

## 2020-12-31 MED ORDER — THIAMINE HCL 100 MG/ML IJ SOLN: 100.0000 mg | Freq: Every day | INTRAMUSCULAR | Status: DC

## 2020-12-31 MED ORDER — ACETAMINOPHEN 325 MG PO TABS: 650.0000 mg | ORAL_TABLET | ORAL | Status: DC | PRN

## 2020-12-31 NOTE — ED Notes (Signed)
TTS ongoing in trauma B

## 2020-12-31 NOTE — ED Notes (Addendum)
Pt changed into burgundy scrubs and clothes placed in belongings bag. Security asked to come wand patient.

## 2020-12-31 NOTE — BH Assessment (Signed)
Care Management - Follow Up [facility] Discharges   Writer attempted to make contact with patient today and was unsuccessful.  Writer was able to leave a HIPPA compliant voice message and will await callback.   

## 2020-12-31 NOTE — ED Notes (Addendum)
Pt belongings inventoried. Pt items not placed in locker as per MD pt will be DC

## 2020-12-31 NOTE — BH Assessment (Signed)
Comprehensive Clinical Assessment (CCA) Note  12/31/2020 [name] [id number] Disposition: Clinician discussed patient care with [name], FNP who said that patient does not meet inpatient care criteria.  Clinician called Dr. Alona Bene and let him know of the recommendation.  Patient needs to follow up with his ACTT provider.  Flowsheet Row ED from 12/30/2020 in [facility] EMERGENCY DEPARTMENT Most recent reading at 12/31/2020 12:44 AM ED from 12/29/2020 in [facility] EMERGENCY DEPARTMENT Most recent reading at 12/29/2020 11:18 PM ED from 12/29/2020 in [facility] Most recent reading at 12/28/2020 11:29 PM  C-SSRS RISK CATEGORY No Risk No Risk No Risk     The patient demonstrates the following risk factors for suicide: Chronic risk factors for suicide include: psychiatric disorder of schizophrenia. Acute risk factors for suicide include: unemployment. Protective factors for this patient include: Pt has ACTT services from [facility]. Considering these factors, the overall suicide risk at this point appears to be  . Patient is appropriate for outpatient follow up.  Pt says he is scared but does not know why. He denies any SI, HI, A/V hallucinations. Pt denies any recent drug use. His UDS is clean. Pt appears restless and confused. He says "I don't know" to most queries. Pt was at [facility] on 12/28/20 and given the option of going to a shelter but he decided to go back out on the street.  Patient has fair eye contact but is not oriented.  Patient looks at times off to the side as if seeing something but he denies any A/V hallucinations.  He did not say anything about thinking the people are after him to this clinician but he said that to Dr. Jacqulyn Bath.  Patient reports sleep and appetite to be WNL.  Chief Complaint:  Chief Complaint  Patient presents with  . Paranoid   Visit Diagnosis: schizophrenia   CCA Screening, Triage  and Referral (STR)  Patient Reported Information How did you hear about [place]? Self (Pt walked to [facility])  Referral name: [facility] Department  Referral phone number: 0 (N/A)   Whom do you see for routine medical problems? I don't have a doctor  Practice/Facility Name: No data recorded Practice/Facility Phone Number: No data recorded Name of Contact: No data recorded Contact Number: No data recorded Contact Fax Number: No data recorded Prescriber Name: No data recorded Prescriber Address (if known): No data recorded  What Is the Reason for Your Visit/Call Today? Pt walked to [facility] but he does not know why he came to [facility].  He says "I don't know to most questions.  How Long Has This Been Causing You Problems? 1-6 months  What Do You Feel Would Help You the Most Today? Treatment for Depression or other mood problem   Have You Recently Been in Any Inpatient Treatment (Hospital/Detox/Crisis Center/28-Day Program)? No  Name/Location of Program/Hospital:[facility]  How Long Were You There? Per chart, "2 days."  When Were You Discharged? 02/24/2020 (Per chart.)   Have You Ever Received Services From [facility] Before? Yes  Who Do You See at [facility]? ED and [facility]   Have You Recently Had Any Thoughts About Hurting Yourself? No  Are You Planning to Commit Suicide/Harm Yourself At This time? No   Have you Recently Had Thoughts About Hurting Someone [name]? No  Explanation: No data recorded  Have You Used Any Alcohol or Drugs in the Past 24 Hours? No  How Long Ago Did You Use Drugs  or Alcohol? 0000 (Unclear)  What Did You Use and How Much? Initially admits to ETOH and THC use, now denies   Do You Currently Have a Therapist/Psychiatrist? No (Pt denies but it is in the chart that he has ACTT services through [facility])  Name of Therapist/Psychiatrist: ACTT Team - agency name unknown   Have You Been Recently Discharged From Any Public relations account executive or Programs?  No  Explanation of Discharge From Practice/Program: No data recorded    CCA Screening Triage Referral Assessment Type of Contact: Tele-Assessment  Is this Initial or Reassessment? Initial Assessment  Date Telepsych consult ordered in [facility]:  12/31/2020  Time Telepsych consult ordered in [facility]:  0104   Patient Reported Information Reviewed? Yes  Patient Left Without Being Seen? No data recorded Reason for Not Completing Assessment: TTS cart currently in use, 2 pt's ahead of this consult. TTS to assess pt when cart is available    Collateral Involvement: Pt denies wanting clinician to contact famiy members.   Does Patient Have a Automotive engineer Guardian? No data recorded Name and Contact of Legal Guardian: [name], legal guardian.   If Minor and Not Living with Parent(s), Who has Custody? N/A  Is CPS involved or ever been involved? Never  Is APS involved or ever been involved? Never   Patient Determined To Be At Risk for Harm To Self or Others Based on Review of Patient Reported Information or Presenting Complaint? No  Method: No Plan  Availability of Means: No access or NA  Intent: Vague intent or NA  Notification Required: No need or identified person  Additional Information for Danger to Others Potential: Active psychosis  Additional Comments for Danger to Others Potential: History of children playing outside triggering patient.  Are There Guns or Other Weapons in Your Home? No  Types of Guns/Weapons: No data recorded Are These Weapons Safely Secured?                            No data recorded Who Could Verify You Are Able To Have These Secured: No data recorded Do You Have any Outstanding Charges, Pending Court Dates, Parole/Probation? None noted  Contacted To Inform of Risk of Harm To Self or Others: Law Enforcement ([employer] is aware of pt's mental health concerns)   Location of Assessment: [facility] ED   Does Patient Present under Involuntary Commitment?  No  IVC Papers Initial File Date: 10/22/2020   [place] of Residence: [place]   Patient Currently Receiving the Following Services: ACTT Psychologist, educational)   Determination of Need: Urgent (48 hours)   Options For Referral: Other: Comment (Pt does not meet inpatient care criteria.)     CCA Biopsychosocial Intake/Chief Complaint:  Pt says he is scared but does not know why.  He denies any SI, HI, A/V hallucinations.  Pt denies any recent drug use.  His UDS is clean.  Pt appears restless and confused.  He says "I don't know" to most queries.  Pt was at [facility] on 12/28/20 and given the option of going to a shelter but he decided to go back out on the street.  Pt  Current Symptoms/Problems: Pt seens unable to articulate what is bothering him.  Says "I don't know" to most questions.   Patient Reported Schizophrenia/Schizoaffective Diagnosis in Past: Yes   Strengths: Not assessed  Preferences: Not assessed  Abilities: Not assessed   Type of Services Patient Feels are Needed: Continue with  ACTT services   Initial Clinical Notes/Concerns: N/A   Mental Health Symptoms Depression:  Difficulty Concentrating   Duration of Depressive symptoms: Greater than two weeks   Mania:  Increased Energy; Irritability   Anxiety:   Restlessness; Tension; Difficulty concentrating   Psychosis:  Delusions   Duration of Psychotic symptoms: Greater than six months   Trauma:  Difficulty staying/falling asleep   Obsessions:  None   Compulsions:  None   Inattention:  None   Hyperactivity/Impulsivity:  N/A   Oppositional/Defiant Behaviors:  None   Emotional Irregularity:  None   Other Mood/Personality Symptoms:  None noted    Mental Status Exam Appearance and self-care  Stature:  Average   Weight:  Average weight   Clothing:  Casual   Grooming:  Normal   Cosmetic use:  None   Posture/gait:  Normal   Motor activity:  Restless   Sensorium  Attention:   Inattentive   Concentration:  Anxiety interferes   Orientation:  -- (UTA)   Recall/memory:  Defective in Immediate; Defective in Short-term; Defective in Recent; Defective in Remote   Affect and Mood  Affect:  Anxious   Mood:  Anxious   Relating  Eye contact:  Normal   Facial expression:  Constricted   Attitude toward examiner:  Suspicious   Thought and Language  Speech flow: Blocked   Thought content:  Delusions   Preoccupation:  None   Hallucinations:  -- (Denies)   Organization:  No data recorded  Affiliated Computer ServicesExecutive Functions  Fund of Knowledge:  Fair   Intelligence:  Average   Abstraction:  -- (UTA)   Judgement:  Impaired   Reality Testing:  Variable   Insight:  Lacking; Poor   Decision Making:  Impulsive   Social Functioning  Social Maturity:  Irresponsible   Social Judgement:  Naive   Stress  Stressors:  -- (UTA)   Coping Ability:  Overwhelmed; Deficient supports   Skill Deficits:  Decision making; Responsibility; Self-control; Interpersonal   Supports:  Friends/Service system     Religion:    Leisure/Recreation:    Exercise/Diet: Exercise/Diet Have You Gained or Lost A Significant Amount of Weight in the Past Six Months?: No Do You Follow a Special Diet?: No Do You Have Any Trouble Sleeping?: No   CCA Employment/Education Employment/Work Situation: Employment / Work Psychologist, occupationalituation Employment situation: Unemployed Has patient ever been in the Eli Lilly and Companymilitary?: No  Education: Education Last Grade Completed: 9 Did Garment/textile technologistYou Graduate From [employer]High School?: No Did Theme park managerYou Attend College?: No Did Designer, television/film setYou Attend Graduate School?: No   CCA Family/Childhood History Family and Relationship History: Family history Marital status: Single  Childhood History:     Child/Adolescent Assessment:     CCA Substance Use Alcohol/Drug Use: Alcohol / Drug Use Pain Medications: see MAR Prescriptions: see MAR Over the Counter: see MAR Longest period of sobriety  (when/how long): Unknown                         ASAM's:  Six Dimensions of Multidimensional Assessment  Dimension 1:  Acute Intoxication and/or Withdrawal Potential:      Dimension 2:  Biomedical Conditions and Complications:      Dimension 3:  Emotional, Behavioral, or Cognitive Conditions and Complications:     Dimension 4:  Readiness to Change:     Dimension 5:  Relapse, Continued use, or Continued Problem Potential:     Dimension 6:  Recovery/Living Environment:     ASAM Severity Score:  ASAM Recommended Level of Treatment:     Substance use Disorder (SUD)    Recommendations for Services/Supports/Treatments:    DSM5 Diagnoses: Patient Active Problem List   Diagnosis Date Noted  . Schizophrenia, paranoid type (HCC) 10/24/2020  . Other schizophrenia (HCC) 02/14/2018  . Cannabis use disorder, moderate, dependence (HCC) 05/10/2016    Patient Centered Plan: Patient is on the following Treatment Plan(s):  Anxiety   Referrals to Alternative Service(s): Referred to Alternative Service(s):   Place:   Date:   Time:    Referred to Alternative Service(s):   Place:   Date:   Time:    Referred to Alternative Service(s):   Place:   Date:   Time:    Referred to Alternative Service(s):   Place:   Date:   Time:     Wandra Mannan

## 2020-12-31 NOTE — ED Provider Notes (Signed)
Emergency Department Provider Note   I have reviewed the triage vital signs and the nursing notes.   HISTORY  Chief Complaint Paranoid   HPI [name] is a 30 y.o. male with past medical history of paranoid schizophrenia presents emergency department with increased paranoia.  He feels that other people are after him.  He is telling me that he cannot see them that he thinks he is dead at times.  He is hoping for some medication to help him.  He denies any drugs or alcohol.  He mostly responds "I don't know" to questioning.  He does deny having thoughts of hurting himself or hurting other people.  Per review of notes the patient does have an ACT team w/ [facility].  He was seen in the emergency department yesterday with a similar presentation with plan to follow-up with his ACT team but returns tonight w/ similar presentation.   Level 5 caveat: Psychiatric disorder  Past Medical History:  Diagnosis Date  . Depression   . Schizophrenia [facility])     Patient Active Problem List   Diagnosis Date Noted  . Schizophrenia, paranoid type (HCC) 10/24/2020  . Other schizophrenia (HCC) 02/14/2018  . Cannabis use disorder, moderate, dependence (HCC) 05/10/2016    Past Surgical History:  Procedure Laterality Date  . BACK SURGERY      Allergies Shellfish allergy and Bee pollen  History reviewed. No pertinent family history.  Social History Social History   Tobacco Use  . Smoking status: Current Some Day Smoker    Types: Cigarettes  . Smokeless tobacco: Never Used  Vaping Use  . Vaping Use: Never used  Substance Use Topics  . Alcohol use: No  . Drug use: Yes    Types: Marijuana    Comment: rare    Review of Systems  Constitutional: No fever/chills Eyes: Intermittently can't see.  ENT: No sore throat. Cardiovascular: Denies chest pain. Respiratory: Denies shortness of breath. Gastrointestinal: No abdominal pain.  No nausea, no vomiting.  No diarrhea.  No  constipation. Musculoskeletal: Negative for back pain. Skin: Negative for rash. Neurological: Negative for headaches. Psychiatric: Positive paranoid delusions. Denies SI/HI.   10-point ROS otherwise negative.  ____________________________________________   PHYSICAL EXAM:  VITAL SIGNS: ED Triage Vitals  Enc Vitals Group     BP 12/30/20 2144 126/88     Pulse Rate 12/30/20 2144 87     Resp 12/30/20 2144 16     Temp 12/30/20 2144 97.8 F (36.6 C)     Temp Source 12/30/20 2144 Oral     SpO2 12/30/20 2144 99 %   Constitutional: Alert and anxious appearing.  Eyes: Conjunctivae are normal.  Head: Atraumatic. Nose: No congestion/rhinnorhea. Mouth/Throat: Mucous membranes are moist.  Neck: No stridor.   Cardiovascular: Normal rate, regular rhythm. Good peripheral circulation. Grossly normal heart sounds.   Respiratory: Normal respiratory effort.  No retractions. Lungs CTAB. Gastrointestinal: Soft and nontender. No distention.  Musculoskeletal: No gross deformities of extremities. Neurologic:  Normal speech and language. Skin:  Skin is warm, dry and intact. No rash noted. Psych: Describing paranoid delusions about people being after him and that he cannot see and recently died.    ____________________________________________   LABS (all labs ordered are listed, but only abnormal results are displayed)  Labs Reviewed  COMPREHENSIVE METABOLIC PANEL - Abnormal; Notable for the following components:      Result Value   Glucose, Bld 120 (*)    All other components within normal limits  RESP PANEL  BY RT-PCR (FLU A&B, COVID) ARPGX2  ETHANOL  CBC  RAPID URINE DRUG SCREEN, HOSP PERFORMED   ____________________________________________  RADIOLOGY  None ____________________________________________   PROCEDURES  Procedure(s) performed:   Procedures  None ____________________________________________   INITIAL IMPRESSION / ASSESSMENT AND PLAN / ED COURSE  Pertinent  labs & imaging results that were available during my care of the patient were reviewed by me and considered in my medical decision making (see chart for details).   Patient presents emergency department with continued paranoid delusions.  He is time he cannot see at times and but then came back.  He is not having suicidal or homicidal ideation but was seen yesterday with similar, discharged with plan to follow with act team, and returns with similar presentation.  Plan for TTS evaluation here.  TTS evaluated. Does not meet inpatient criteria. Recommend ACT team follow up.  ____________________________________________  FINAL CLINICAL IMPRESSION(S) / ED DIAGNOSES  Final diagnoses:  Paranoia (HCC)    Note:  This document was prepared using Dragon voice recognition software and may include unintentional dictation errors.  Alona Bene, MD, [facility] Emergency Medicine    Keir Viernes, Arlyss Repress, MD 12/31/20 [phone]

## 2020-12-31 NOTE — ED Notes (Signed)
This RN went over DC paper work with pt and list of resources. Pt was given belongings bag back with clothes and asked to change

## 2020-12-31 NOTE — ED Notes (Signed)
Patient verbalizes understanding of discharge instructions. Opportunity for questioning and answers were provided. Armband removed by staff, pt discharged from ED ambulatory.   

## 2020-12-31 NOTE — Discharge Instructions (Signed)
Substance Abuse Treatment Programs ° °Intensive Outpatient Programs °[facility]     °[address]      °[place], [place]                   °[phone]      ° °[facility] °[address] °[place], [place] °[phone] ° °[facility]     °(Inpatient and outpatient)     °[address]           °[phone]   ° °[facility] °[phone] (Suboxone and Methadone) ° °[address]      °[place], [place] [address]      °[phone]      ° °[address] °[place], [place] °[phone] ° °[facility] (Outpatient/Inpatient, [facility])    °(insurance only) [phone]      °       °[facility] (Groups & Residential) °[place], [place] °[phone] ° °   °[facility]     °[address]     °[place], [place]      °[phone]      ° °[facility] (for caregivers and family) °[address] °[place], [place] °336-299-4655 ° ° ° ° ° °Residential Treatment Programs °[facility]      °[address], [place], [place] [address]  °([phone]      ° °[facility] °[address] [place], [place] [address] °[phone] ° °[facility]        °[phone]      ° °[facility] °1-800-659-3381 ° °[facility]             °[address]                                         °[place], [place]                                                °877-615-2722 or 336-784-9470                              ° °[facility] [place] °[address] °[place], [address] °1.877.941.8954 ° °[facility]    °[address]      °[place], [place]     °[phone]      ° °[facility] °[address] °, [place] °336-285-9073 ° °[facility]   °[address]     °[place], [place] [address]     °[phone]      °Admissions: 8am-3pm M-F ° °[facility] °[address] °[place] °336-227-7417 ° °[facility]: Residential Program (90 Days)   °[place], [place]      °336-725-8389 or 800-758-6077    ° °[facility] °[place], [place] °(Walk in Hours over the weekend or by referral) ° °[facility] °[address], [place], [place] [address] °[phone] ° °Crisis Mobile: [facility]:  1-877-626-1772 (for crisis response 24 hours a day) °[facility] Hotline:      1-800-256-2452 °Outpatient Psychiatry and Counseling ° °[facility]: Mobile Crisis   Management 24 hours:  1-877-626-1772 ° °[facility] sliding scale fee and walk in schedule: M-F 8am-12pm/1pm-3pm °[address]  °[place], [place] [address] °[phone] ° °[facility] °[address] °[place], [place] [address] °336-703-9650 ° °[facility] (Formerly known as [facility])- new patient walk-in appointments available Monday - Friday 8am -3pm.          °[address] °[place], [place] [address] °336-676-6840 or crisis line- 336-676-6905 ° ° [facility] Intensive Outpatient Therapy Program °[address] °[place], [place] [address] °336-832-9804 ° °[facility]                  °Crisis Services      °[phone]      °[address]     °[place], [place] [address]                ° °[facility]   °[facility] °[phone] °[address] °[place], [place] [address] ° ° °[facility]          °[address]  °[place], [place] [address]       °([phone] ° °[facility] °[address] °[place], [place] [address] °336-292-1510 ° °[facility]    °[address]    °[place], [place] [address]     °[phone]      ° °Parish McKinney, MD     °[address]     °[place] [address]     °336-282-1251     °  °[facility] °[address] °[place] [address] ° °[facility]     °[address]     °[place], [place]      °336-542-2076      ° °[facility] °Shamsher Ahluwalia, MD °[address] °[place], [place] [address] °336-420-9558 ° °[facility]     °[address]     °[place], [place] [address]     °336-274-1237      ° °[facility] °[address] °[place], [place] [address] °336-854-4450 °Resources for Temporary Residential Assistance/Crisis Centers ° °DAY CENTERS °[facility] °M-F 8am-3pm   °[address] [place], [place] [address]   336-332-0824 °Services include: laundry, barbering, support groups, case management, phone  & computer access, showers, AA/NA mtgs, mental health/substance abuse nurse, job skills class, disability information, VA assistance, spiritual classes, etc.  ° °HOMELESS SHELTERS ° °[facility]     °[facility]   °[address], [place]     °336.271.5959       °       °[facility] (women and children)       °[address] °[place], [place] [address] °336-275-0820 °[email or web address] for application and process °Application Required ° °[facility]   °[address]    °[place] [address]     °[phone]       °             °[facility] °[address] °, [place] [address] °336.273.5572 °336-235-0363(schedule application appt.) °Application Required ° °[facility] (women only)    °[address]     °[place],  [address]     °336-884-1039      °  Intake starts 6pm daily °Need valid ID, SSC, & Police report °[facility] [place] °[address] °[place], [place] °336-881-5420 °Application Required ° °[facility] (men only)     °[address]      °[place], [place]     °[phone]      ° °[facility] °(Pregnant women only) °[address] °[place], [place] °336-275-0206 ° °[facility]      °[address]      °[place], [place] [address]     °336-722-9951      °       °[facility] °[address] °[place], [place] °336-723-1848 °90 day commitment/SA/Application process ° °[facility](men only)     °[address]     °[place], [place]     °[phone]       °Check-in at 7pm     °       °[facility] °[address] °[place], [place] [address] °336-248-6684 °Men/Women/Women and Children must be there by 7 pm ° °[facility] °[place], [place] °336-722-8721                ° °

## 2020-12-31 NOTE — ED Notes (Addendum)
Pt unable/unwilling to answer some questions. Pt says he does not know his name but can tell me where he is at and what the year is. Pt is denying SI and HI at this time. Pt states "I am not right in the head. I feel like I am stuck. I can't see. I need medicine." Pt reports no pain at this time.

## 2021-01-02 ENCOUNTER — Other Ambulatory Visit: Payer: Self-pay

## 2021-01-02 ENCOUNTER — Emergency Department (HOSPITAL_COMMUNITY): Admission: EM | Admit: 2021-01-02 | Discharge: 2021-01-02 | Discharge status: Absent without leave | Payer: Self-pay

## 2021-01-02 ENCOUNTER — Encounter (HOSPITAL_COMMUNITY): Payer: Self-pay | Admitting: Emergency Medicine

## 2021-01-02 ENCOUNTER — Other Ambulatory Visit: Payer: Self-pay | Admitting: Psychiatry

## 2021-01-02 ENCOUNTER — Emergency Department (HOSPITAL_COMMUNITY)
Admission: EM | Admit: 2021-01-02 | Discharge: 2021-01-02 | Disposition: A | Discharge status: Home / Self Care | Payer: Medicaid Other | Attending: Emergency Medicine | Admitting: Emergency Medicine

## 2021-01-02 DIAGNOSIS — F22 Delusional disorders: Secondary | ICD-10-CM | POA: Diagnosis present

## 2021-01-02 DIAGNOSIS — F17210 Nicotine dependence, cigarettes, uncomplicated: Secondary | ICD-10-CM | POA: Insufficient documentation

## 2021-01-02 DIAGNOSIS — F200 Paranoid schizophrenia: Secondary | ICD-10-CM | POA: Diagnosis not present

## 2021-01-02 DIAGNOSIS — F1999 Other psychoactive substance use, unspecified with unspecified psychoactive substance-induced disorder: Secondary | ICD-10-CM | POA: Clinically undetermined

## 2021-01-02 DIAGNOSIS — F1220 Cannabis dependence, uncomplicated: Secondary | ICD-10-CM | POA: Diagnosis present

## 2021-01-02 DIAGNOSIS — Z20822 Contact with and (suspected) exposure to covid-19: Secondary | ICD-10-CM | POA: Insufficient documentation

## 2021-01-02 LAB — COMPREHENSIVE METABOLIC PANEL
ALT: 14 U/L (ref 0–44)
AST: 26 U/L (ref 15–41)
Albumin: 4.2 g/dL (ref 3.5–5.0)
Alkaline Phosphatase: 50 U/L (ref 38–126)
Anion gap: 10 (ref 5–15)
BUN: 18 mg/dL (ref 6–20)
CO2: 21 mmol/L — ABNORMAL LOW (ref 22–32)
Calcium: 9.1 mg/dL (ref 8.9–10.3)
Chloride: 102 mmol/L (ref 98–111)
Creatinine, Ser: 1.17 mg/dL (ref 0.61–1.24)
GFR, Estimated: >60 mL/min (ref >60)
Glucose, Bld: 111 mg/dL — ABNORMAL HIGH (ref 70–99)
Potassium: 3.9 mmol/L (ref 3.5–5.1)
Sodium: 133 mmol/L — ABNORMAL LOW (ref 135–145)
Total Bilirubin: 0.9 mg/dL (ref 0.3–1.2)
Total Protein: 6.9 g/dL (ref 6.5–8.1)

## 2021-01-02 LAB — CBC WITH DIFFERENTIAL/PLATELET
Abs Immature Granulocytes: 0.04 10*3/uL (ref 0.00–0.07)
Basophils Absolute: 0 10*3/uL (ref 0.0–0.1)
Basophils Relative: 0 %
Eosinophils Absolute: 0.8 10*3/uL — ABNORMAL HIGH (ref 0.0–0.5)
Eosinophils Relative: 10 %
HCT: 43.9 % (ref 39.0–52.0)
Hemoglobin: 15.2 g/dL (ref 13.0–17.0)
Immature Granulocytes: 1 %
Lymphocytes Relative: 43 %
Lymphs Abs: 3.7 10*3/uL (ref 0.7–4.0)
MCH: 31.3 pg (ref 26.0–34.0)
MCHC: 34.6 g/dL (ref 30.0–36.0)
MCV: 90.5 fL (ref 80.0–100.0)
Monocytes Absolute: 0.6 10*3/uL (ref 0.1–1.0)
Monocytes Relative: 7 %
Neutro Abs: 3.3 10*3/uL (ref 1.7–7.7)
Neutrophils Relative %: 39 %
Platelets: 265 10*3/uL (ref 150–400)
RBC: 4.85 MIL/uL (ref 4.22–5.81)
RDW: 12.8 % (ref 11.5–15.5)
WBC: 8.5 10*3/uL (ref 4.0–10.5)
nRBC: 0 % (ref 0.0–0.2)

## 2021-01-02 LAB — RESP PANEL BY RT-PCR (FLU A&B, COVID) ARPGX2
Influenza A by PCR: NEGATIVE
Influenza B by PCR: NEGATIVE
SARS Coronavirus 2 by RT PCR: NEGATIVE

## 2021-01-02 LAB — ETHANOL: Alcohol, Ethyl (B): <10 mg/dL (ref <10)

## 2021-01-02 MED ORDER — PALIPERIDONE ER 6 MG PO TB24: 6.0000 mg | ORAL_TABLET | Freq: Every day | ORAL | 0 refills | Status: DC

## 2021-01-02 MED ORDER — PALIPERIDONE ER 3 MG PO TB24: 6.0000 mg | ORAL_TABLET | Freq: Every day | ORAL | Status: DC

## 2021-01-02 MED ORDER — OLANZAPINE 5 MG PO TBDP: 5.0000 mg | ORAL_TABLET | Freq: Every day | ORAL | Status: DC

## 2021-01-02 MED ORDER — PALIPERIDONE PALMITATE ER 117 MG/0.75ML IM SUSY: 117.0000 mg | PREFILLED_SYRINGE | Freq: Once | INTRAMUSCULAR | Status: DC

## 2021-01-02 MED ORDER — HYDROXYZINE HCL 25 MG PO TABS
25.0000 mg | ORAL_TABLET | Freq: Three times a day (TID) | ORAL | Status: DC | PRN
  Administered 2021-01-02: 25 mg via ORAL
  Filled 2021-01-02: qty 1

## 2021-01-02 MED ORDER — PALIPERIDONE ER 6 MG PO TB24
6.0000 mg | ORAL_TABLET | Freq: Every day | ORAL | Status: DC
  Filled 2021-01-02: qty 1

## 2021-01-02 NOTE — ED Notes (Signed)
Attempted to call aunt to determine time of transport with no answer.

## 2021-01-02 NOTE — ED Notes (Signed)
Patient given crackers, peanut butter, ginger ale.

## 2021-01-02 NOTE — ED Provider Notes (Signed)
[facility]-EMERGENCY DEPT Provider Note   CSN: [id number] Arrival date & time: 01/02/21  0240     History Chief Complaint  Patient presents with  . Paranoid    [name] is a 30 y.o. male with a history of marijuana usage and schizophrenia presents to the Emergency Department complaining of gradual, persistent, progressively worsening paranoia.  Patient reports he feels unsafe and believes people are trying to harm him.  Denies suicidal or homicidal ideation.  Denies auditory visual hallucinations.  Reports he needs medication for help with his mind.  Reports he is not currently taking any medication.  Reports he is nowhere safe to go.  No particular aggravating or alleviating factors.  Denies fever, chills, headache, neck pain, chest pain, shortness of breath, dumping, nausea, vomiting, diarrhea, weakness, dizziness, syncope.  Denies alcohol usage.  Patient evaluated at [facility] 2 days ago for similar.  Cleared by TTS at that time however unable to obtain enough information to determine if patient is safe.   The history is provided by the patient and medical records. No language interpreter was used.       Past Medical History:  Diagnosis Date  . Depression   . Schizophrenia [facility])     Patient Active Problem List   Diagnosis Date Noted  . Schizophrenia, paranoid type (HCC) 10/24/2020  . Other schizophrenia (HCC) 02/14/2018  . Cannabis use disorder, moderate, dependence (HCC) 05/10/2016    Past Surgical History:  Procedure Laterality Date  . BACK SURGERY         History reviewed. No pertinent family history.  Social History   Tobacco Use  . Smoking status: Current Some Day Smoker    Types: Cigarettes  . Smokeless tobacco: Never Used  Vaping Use  . Vaping Use: Never used  Substance Use Topics  . Alcohol use: No  . Drug use: Yes    Types: Marijuana    Comment: rare    Home Medications Prior to Admission medications   Not on File     Allergies    Shellfish allergy and Bee pollen  Review of Systems   Review of Systems  Constitutional: Negative for appetite change, diaphoresis, fatigue, fever and unexpected weight change.  HENT: Negative for mouth sores.   Eyes: Negative for visual disturbance.  Respiratory: Negative for cough, chest tightness, shortness of breath and wheezing.   Cardiovascular: Negative for chest pain.  Gastrointestinal: Negative for abdominal pain, constipation, diarrhea, nausea and vomiting.  Endocrine: Negative for polydipsia, polyphagia and polyuria.  Genitourinary: Negative for dysuria, frequency, hematuria and urgency.  Musculoskeletal: Negative for back pain and neck stiffness.  Skin: Negative for rash.  Allergic/Immunologic: Negative for immunocompromised state.  Neurological: Negative for syncope, light-headedness and headaches.  Hematological: Does not bruise/bleed easily.  Psychiatric/Behavioral: Negative for sleep disturbance. The patient is nervous/anxious.     Physical Exam Updated Vital Signs BP 135/89   Pulse 84   Temp 97.9 F (36.6 C) (Oral)   Resp 18   SpO2 95%   Physical Exam Vitals and nursing note reviewed.  Constitutional:      General: He is not in acute distress.    Appearance: He is well-developed.  HENT:     Head: Normocephalic.     Nose: Nose normal.     Mouth/Throat:     Mouth: Mucous membranes are moist.  Eyes:     General: No scleral icterus.    Conjunctiva/sclera: Conjunctivae normal.  Cardiovascular:     Rate and  Rhythm: Normal rate.  Pulmonary:     Effort: Pulmonary effort is normal.  Abdominal:     Palpations: Abdomen is soft.     Tenderness: There is no abdominal tenderness.  Musculoskeletal:        General: Normal range of motion.     Cervical back: Normal range of motion.  Skin:    General: Skin is warm and dry.     Capillary Refill: Capillary refill takes less than 2 seconds.  Neurological:     Mental Status: He is alert.   Psychiatric:        Thought Content: Thought content is paranoid. Thought content does not include homicidal or suicidal ideation. Thought content does not include homicidal or suicidal plan.     ED Results / Procedures / Treatments   Labs (all labs ordered are listed, but only abnormal results are displayed) Labs Reviewed  CBC WITH DIFFERENTIAL/PLATELET - Abnormal; Notable for the following components:      Result Value   Eosinophils Absolute 0.8 (*)    All other components within normal limits  RESP PANEL BY RT-PCR (FLU A&B, COVID) ARPGX2  COMPREHENSIVE METABOLIC PANEL  ETHANOL  RAPID URINE DRUG SCREEN, HOSP PERFORMED    EKG None  Radiology No results found.  Procedures Procedures   Medications Ordered in ED Medications - No data to display  ED Course  I have reviewed the triage vital signs and the nursing notes.  Pertinent labs & imaging results that were available during my care of the patient were reviewed by me and considered in my medical decision making (see chart for details).    MDM Rules/Calculators/A&P                           Patient presents to the emergency department with paranoia believing that people are out to get him.  Denies suicidal or homicidal ideation.  Patient has a history of paranoia and schizophrenia.  Also long-term marijuana usage.  Not currently medicated.  Medical clearance labs pending.  Will be evaluated by TTS.  6:52 AM Labs and TTS assessment pending.    At shift change care was transferred to [facility] who will follow pending studies, re-evaulate and determine disposition.     Final Clinical Impression(s) / ED Diagnoses Final diagnoses:  Paranoia [facility])    Rx / DC Orders ED Discharge Orders    None       Muthersbaugh, Boyd Kerbs 01/02/21 3762    Zadie Rhine, MD 01/02/21 2354

## 2021-01-02 NOTE — ED Notes (Signed)
Patient refusing injection. NP made aware.

## 2021-01-02 NOTE — Discharge Instructions (Addendum)
Please follow-up with [facility].  Take medication as prescribed.

## 2021-01-02 NOTE — ED Notes (Signed)
Patient escorted to shower upon request. Provided with soap, towels, scrubs, and socks.

## 2021-01-02 NOTE — Consult Note (Addendum)
Telepsych Consultation   Reason for Consult:  Psych consult Referring Physician:  Antionette Poles Location of Patient:    [name] [id number] Location of Provider: Behavioral Health TTS Department  Patient Identification: [name] MRN:  [id number] Principal Diagnosis: Schizophrenia, paranoid type (HCC) Diagnosis:  Principal Problem:   Schizophrenia, paranoid type (HCC) Active Problems:   Cannabis use disorder, moderate, dependence (HCC)   Substance-induced disorder (HCC)   Total Time spent with patient: 45 minutes  Subjective:   [name] is a 30 y.o. male patient admitted with paranoia. Patient discharged 03/24 from [facility] for similar presentation.   On assessment patient is alert and oriented; engaged briefly in assessment. Patient states "I need help managing my life. Getting my mind. My thoughts together. Get some money. I need money to survive". Provider attempted to discuss with patient what he meant by the statement where he would reply "I don't know. I just need help managing". Provider discussed patient's current resources and ACTT Team and patient responded, "I don't know. I just need help". Provider attempted to process with patient who responded, "Man I need something to eat. I'm hungry and I'm tired". Patient smiled during assessment, answering majority of questions as  "I don't know". Patient denies any suicidal or homicidal ideations at this time. Denies any auditory or visual hallucinations. Does not appear to be actively psychotic at this time.   Patient provided verbal consent to call "grandma, my aunt, or [name]" for collateral information.   Per chart review: patient offered Gean Birchwood 117mg  IM injection 12/23/20; he refused, given PO Paliperidone. Currently receives 12/25/20 via [employer] (therapist) [phone], Team Lead [phone].   1300: Provider notified via chat patient is refusing injection. Provider attempted 2nd  teleconsult; patient continues to refuse injection. States, "I don't need that. I need a chill pill". Provider attempted to process with patient the need for medication, he continues to refuse. Notified nurse [phone], RN, aunt and ACT Team lead Karleen Hampshire.   1448: Provider notified patient is refusing his PO Paliperidone 6mg ; nursing actively trying to encourage patient to take it.   Collateral:  [name] (grandmother) [phone] (no answer) Linna Caprice (aunt/payee) [phone] States she and [name] team member went looking patient yesterday and couldn't find him; he was just at my [name]. States patient currently lives with her when at his own apartment and was at her mother's house "day before yesterday". States patient has his own place but he refuses to stay there most nights so he will stay with her. Says "patient won't be still. I don't understand". States patient is currently at baseline and she will come get patient today. Says ACTT Team visits patient twice a week. Says patient has two pending court dates for trespassing and loitering; says patient loiters at stores. Aunt denies any concerns for patient's safety stating, "he has paranoid schizophrenia, he's always like this. He's better sometimes but he's fine now. I'll come and get him. I'm his payee, he has nice clothes, food, and his bills are paid. I don't know why he keeps saying that. But he's fine. He's been like that and some of this is for attention. I will come and get him".   [phone] (therapist) [employer] Team: [phone] States patient refused IM injection and only had PO Invega on 12/23/20; says patient hasn't had injection "in a while". States patient has been paranoid and disorganized for a while now. Says she sees him every Thursday and is the only person  he will see regularly. States patient does well when on injection but has not had it. Endorsed patient does have his own apartment with aunt as payee and  does have active ACTT services via [facility]. Patient is currently his own guardian.   [facility] Can[facility] Team Lead: [phone] called States patient has furnished apartment. States patient won't stay in apartment and says patient is constantly complaining of not having money or food; aunt is the payee. States patient has been off medications 1.5 year; ACTT cannot force the injection. Notified provider that patient's mother recently passed and patient refuses to accept it. Mentioned concerns from team that aunt is mismanaging patient's assets stating patient is frequently reaching out to staff with complaints of being hungry. Provided name of pharmacy [facility]) for PO medications since patient is refusing injection.   HPI:   [name] is a 31 year old male who presented to [facility] with paranoia; UDS positive for marijuana.  Patient has presented consistently multiple times to ED's throughout the [facility] system since 12/21/20 for similar presentation; patient discharged from [facility]/22, [facility] 03/23 "difficulty focusing on reality", [facility] 03/23, [facility]. Patient has past psychiatric history of schizophrenia and medication non-compliance. Patient is currently receiving outpatient services via [facility] Team with [name] as Team Lead. Patient was receiving Gean Birchwood in which he has been refusing for the past 6 months; per Team Lead [name] past 1.5 year. Patient has since suffered from ongoing paranoia and symptoms possibly affected by consistent marijuana use. Patient continues to refuse medication from ACTT Team and ED staff. Patient has his own apartment, lives between there and with his aunt Clide Cliff (payee).   Past Psychiatric History:   -schizophrenia  -marijuana use  Past Medical History:  Past Medical History:  Diagnosis Date  . Depression   . Schizophrenia [facility])     Past Surgical History:  Procedure Laterality Date  . BACK SURGERY     Family History: History reviewed. No pertinent family  history. Family Psychiatric  History: not noted Social History:  Social History   Substance and Sexual Activity  Alcohol Use No     Social History   Substance and Sexual Activity  Drug Use Yes  . Types: Marijuana   Comment: rare    Social History   Socioeconomic History  . Marital status: Single    Spouse name: Not on file  . Number of children: Not on file  . Years of education: Not on file  . Highest education level: Not on file  Occupational History  . Occupation: unemployed  Tobacco Use  . Smoking status: Current Some Day Smoker    Types: Cigarettes  . Smokeless tobacco: Never Used  Vaping Use  . Vaping Use: Never used  Substance and Sexual Activity  . Alcohol use: No  . Drug use: Yes    Types: Marijuana    Comment: rare  . Sexual activity: Yes    Birth control/protection: Condom  Other Topics Concern  . Not on file  Social History Narrative   ** Merged History Encounter **       UTA certain topics - tangential thought process, cannot answer clearly. Pt not sure about school; reportedly is homeless.    Social Determinants of Health   Financial Resource Strain: Not on file  Food Insecurity: Not on file  Transportation Needs: Not on file  Physical Activity: Not on file  Stress: Not on file  Social Connections: Not on file   Additional Social History:  [facility] Care Coordinator [name] [phone]  Allergies:   Allergies  Allergen Reactions  . Shellfish Allergy Hives and Itching    seafood  . Bee Pollen Itching    Labs:  Results for orders placed or performed during the hospital encounter of 01/02/21 (from the past 48 hour(s))  Resp Panel by RT-PCR (Flu A&B, Covid) Nasopharyngeal Swab     Status: None   Collection Time: 01/02/21  5:43 AM   Specimen: Nasopharyngeal Swab; Nasopharyngeal(NP) swabs in vial transport medium  Result Value Ref Range   SARS Coronavirus 2 by RT PCR NEGATIVE NEGATIVE    Comment: (NOTE) SARS-CoV-2 target nucleic  acids are NOT DETECTED.  The SARS-CoV-2 RNA is generally detectable in upper respiratory specimens during the acute phase of infection. The lowest concentration of SARS-CoV-2 viral copies this assay can detect is 138 copies/mL. A negative result does not preclude SARS-Cov-2 infection and should not be used as the sole basis for treatment or other patient management decisions. A negative result may occur with  improper specimen collection/handling, submission of specimen other than nasopharyngeal swab, presence of viral mutation(s) within the areas targeted by this assay, and inadequate number of viral copies(<138 copies/mL). A negative result must be combined with clinical observations, patient history, and epidemiological information. The expected result is Negative.  Fact Sheet for Patients:  BloggerCourse.comhttps://www.fda.gov/media/152166/download  Fact Sheet for Healthcare Providers:  SeriousBroker.ithttps://www.fda.gov/media/152162/download  This test is no t yet approved or cleared by the [place] FDA and  has been authorized for detection and/or diagnosis of SARS-CoV-2 by FDA under an Emergency Use Authorization (EUA). This EUA will remain  in effect (meaning this test can be used) for the duration of the COVID-19 declaration under Section 564(b)(1) of the Act, 21 U.S.C.section 360bbb-3(b)(1), unless the authorization is terminated  or revoked sooner.       Influenza A by PCR NEGATIVE NEGATIVE   Influenza B by PCR NEGATIVE NEGATIVE    Comment: (NOTE) The Xpert Xpress SARS-CoV-2/FLU/RSV plus assay is intended as an aid in the diagnosis of influenza from Nasopharyngeal swab specimens and should not be used as a sole basis for treatment. Nasal washings and aspirates are unacceptable for Xpert Xpress SARS-CoV-2/FLU/RSV testing.  Fact Sheet for Patients: BloggerCourse.comhttps://www.fda.gov/media/152166/download  Fact Sheet for Healthcare Providers: SeriousBroker.ithttps://www.fda.gov/media/152162/download  This test is not  yet approved or cleared by the [place] FDA and has been authorized for detection and/or diagnosis of SARS-CoV-2 by FDA under an Emergency Use Authorization (EUA). This EUA will remain in effect (meaning this test can be used) for the duration of the COVID-19 declaration under Section 564(b)(1) of the Act, 21 U.S.C. section 360bbb-3(b)(1), unless the authorization is terminated or revoked.  Performed at [facility], [address] [place], [place] [address]   Comprehensive metabolic panel     Status: Abnormal   Collection Time: 01/02/21  5:43 AM  Result Value Ref Range   Sodium 133 (L) 135 - 145 mmol/L   Potassium 3.9 3.5 - 5.1 mmol/L   Chloride 102 98 - 111 mmol/L   CO2 21 (L) 22 - 32 mmol/L   Glucose, Bld 111 (H) 70 - 99 mg/dL    Comment: Glucose reference range applies only to samples taken after fasting for at least 8 hours.   BUN 18 6 - 20 mg/dL   Creatinine, Ser 2.951.17 0.61 - 1.24 mg/dL   Calcium 9.1 8.9 - 62.110.3 mg/dL   Total Protein 6.9 6.5 - 8.1 g/dL   Albumin 4.2 3.5 -  5.0 g/dL   AST 26 15 - 41 U/L   ALT 14 0 - 44 U/L   Alkaline Phosphatase 50 38 - 126 U/L   Total Bilirubin 0.9 0.3 - 1.2 mg/dL   GFR, Estimated >91 >50 mL/min    Comment: (NOTE) Calculated using the CKD-EPI Creatinine Equation (2021)    Anion gap 10 5 - 15    Comment: Performed at [facility], [address], [place], [place] [address]  Ethanol     Status: None   Collection Time: 01/02/21  5:43 AM  Result Value Ref Range   Alcohol, Ethyl (B) <10 <10 mg/dL    Comment: (NOTE) Lowest detectable limit for serum alcohol is 10 mg/dL.  For medical purposes only. Performed at [facility], [address] [place], [place] [address]   CBC with Diff     Status: Abnormal   Collection Time: 01/02/21  5:43 AM  Result Value Ref Range   WBC 8.5 4.0 - 10.5 K/uL   RBC 4.85 4.22 - 5.81 MIL/uL   Hemoglobin 15.2 13.0 - 17.0 g/dL   HCT 55.3 74.8 -  27.0 %   MCV 90.5 80.0 - 100.0 fL   MCH 31.3 26.0 - 34.0 pg   MCHC 34.6 30.0 - 36.0 g/dL   RDW 78.6 75.4 - 49.2 %   Platelets 265 150 - 400 K/uL   nRBC 0.0 0.0 - 0.2 %   Neutrophils Relative % 39 %   Neutro Abs 3.3 1.7 - 7.7 K/uL   Lymphocytes Relative 43 %   Lymphs Abs 3.7 0.7 - 4.0 K/uL   Monocytes Relative 7 %   Monocytes Absolute 0.6 0.1 - 1.0 K/uL   Eosinophils Relative 10 %   Eosinophils Absolute 0.8 (H) 0.0 - 0.5 K/uL   Basophils Relative 0 %   Basophils Absolute 0.0 0.0 - 0.1 K/uL   Immature Granulocytes 1 %   Abs Immature Granulocytes 0.04 0.00 - 0.07 K/uL    Comment: Performed at [facility], [address], [place], [place] [address]    Medications:  Current Facility-Administered Medications  Medication Dose Route Frequency Provider Last Rate Last Admin  . hydrOXYzine (ATARAX/VISTARIL) tablet 25 mg  25 mg Oral TID PRN Leevy-Johnson, Anfernee Peschke A, NP   25 mg at 01/02/21 1328  . paliperidone (INVEGA) 24 hr tablet 6 mg  6 mg Oral Daily Leevy-Johnson, Damira Kem A, NP      . paliperidone (INVEGA) 24 hr tablet 6 mg  6 mg Oral Daily Leevy-Johnson, Smita Lesh A, NP      . Paliperidone ER (INVEGA SUSTENNA) injection 117 mg  117 mg Intramuscular Once Leevy-Johnson, Lesley Galentine A, NP       No current outpatient medications on file.    Musculoskeletal: Strength & Muscle Tone: within normal limits Gait & Station: normal Patient leans: N/A  Psychiatric Specialty Exam: Physical Exam Vitals and nursing note reviewed.  Psychiatric:        Attention and Perception: He is inattentive.        Mood and Affect: Mood normal. Affect is blunt.        Speech: Speech normal.        Behavior: Behavior is cooperative.        Cognition and Memory: Cognition and memory normal.        Judgment: Judgment is impulsive.     Review of Systems  Psychiatric/Behavioral: Positive for decreased concentration.    Blood pressure 135/89, pulse 92, temperature  97.9 F (36.6 C), temperature  source Oral, resp. rate 20, SpO2 95 %.There is no height or weight on file to calculate BMI.  General Appearance: Casual and Disheveled  Eye Contact:  Fair  Speech:  Clear and Coherent  Volume:  Normal  Mood:  Dysphoric  Affect:  Non-Congruent  Thought Process:  Goal Directed  Orientation:  Full (Time, Place, and Person)  Thought Content:  at base line per aunt report  Suicidal Thoughts:  No  Homicidal Thoughts:  No  Memory:  Immediate;   Fair Recent;   Fair Remote;   Fair  Judgement:  Poor  Insight:  Lacking and Shallow  Psychomotor Activity:  Normal  Concentration:  Concentration: Fair and Attention Span: Fair  Recall:  [employer] of Knowledge:  Fair  Language:  Fair  Akathisia:  NA  Handed:   AIMS (if indicated):     Assets:  Health and safety inspector Housing Physical Health Resilience Social Support Transportation  ADL's:  Intact  Cognition:  WNL  Sleep:      Treatment Plan Summary: -Discharge patient to care of aunt (payee); states patient is at baseline and does not feel patient is threat to himself or others.   -Prescription for Paliperidone 6mg  PO given; patient refused IM injection -Spoke with ACTT Team Lead , Therapist [name]; both made aware of patient encounters in ED, presentation, plan for discharge, refusal of IM injection, and prescription given. ACTT Team to follow up with patient tomorrow Monday 01/03/2021, fill prescription at their pharmacy 01/05/2021).   Disposition: No evidence of imminent risk to self or others at present.   Patient does not meet criteria for psychiatric inpatient admission. Supportive therapy provided about ongoing stressors. Discussed crisis plan, support from social network, calling 911, coming to the Emergency Department, and calling Suicide Hotline. Patient appears to be at his baseline functioning. Currently has intensive outpatient wrap-around services and family involvement. Parties made aware, patient to discharge  with outpatient follow-up.   This service was provided via telemedicine using a 2-way, interactive audio and video technology.  Names of all persons participating in this telemedicine service and their role in this encounter. Name: Harlow Asa Role: PMHNP  Name: Maxie Barb Role: Attending MD  Name: [name] Role: patient  Name: [name] Role: aunt    Fenton Foy, NP 01/02/2021 2:17 PM

## 2021-01-02 NOTE — ED Notes (Signed)
Tele psych machine at bedside 

## 2021-01-02 NOTE — ED Notes (Signed)
NP aware patient is refusing PO Invega. Per NP, spoke to aunt and she will provide transport home for patient.

## 2021-01-02 NOTE — ED Provider Notes (Addendum)
Care of the patient received from Muthersbaugh PA-C. Patient here with paranoid thoughts, pending TTS consult.  Labs are overall reassuring.  TTS evaluated the patient, states he stable for discharge.  Patient's mother should be picking him up.  Psych NP prescribed the patient Invega orally, requested that I fill it.  Prescription printed for patient.      Mare Ferrari, PA-C 01/02/21 1446    Bethann Berkshire, MD 01/04/21 [phone]

## 2021-01-02 NOTE — BH Assessment (Signed)
TTS attempted to assess patient, but he refused stating that he was sleeping.  TTS to wait to see patient when he is willing to participate in assessment.

## 2021-01-02 NOTE — ED Notes (Signed)
Patient refuses assessment at this time.

## 2021-01-02 NOTE — ED Triage Notes (Addendum)
Pt reports that he is "high on marijuana and needs a chill pill." Endorses feeling like someone is after him. Denies SI/HI. Similar symptoms 3 days ago at [facility] and he did not meet inpatient criteria.

## 2021-01-02 NOTE — ED Notes (Addendum)
Aunt arrived to take patient. Reviewed discharge paperwork and prescriptions with patient and aunt. One labeled bag returned to patient.

## 2021-01-06 ENCOUNTER — Emergency Department (HOSPITAL_COMMUNITY)
Admission: EM | Admit: 2021-01-06 | Discharge: 2021-01-07 | Disposition: A | Discharge status: Home / Self Care | Payer: Medicaid Other | Attending: Emergency Medicine | Admitting: Emergency Medicine

## 2021-01-06 ENCOUNTER — Encounter (HOSPITAL_COMMUNITY): Payer: Self-pay

## 2021-01-06 DIAGNOSIS — F419 Anxiety disorder, unspecified: Secondary | ICD-10-CM | POA: Insufficient documentation

## 2021-01-06 DIAGNOSIS — F200 Paranoid schizophrenia: Secondary | ICD-10-CM | POA: Diagnosis not present

## 2021-01-06 DIAGNOSIS — R0602 Shortness of breath: Secondary | ICD-10-CM | POA: Insufficient documentation

## 2021-01-06 DIAGNOSIS — F17210 Nicotine dependence, cigarettes, uncomplicated: Secondary | ICD-10-CM | POA: Insufficient documentation

## 2021-01-06 NOTE — ED Triage Notes (Signed)
Patient arrives via EMS from his aunts house with complaint of difficulty breathing. EMS reports wheezing , SPO2 93% upon arrival, 100% after albuterol given. Pt does report feeling anxious and unable to think clearly. EMS reports patient does become more anxious when females are present. Hx of schizophrenia, non compliant on meds. Denies SI/HI.    HR 92 BP 130/84 SPO2 99% RA  RR 21  T 98.8

## 2021-01-07 NOTE — ED Notes (Signed)
Appears to rest.  No distress noted.  Calm and cooperative

## 2021-01-07 NOTE — ED Provider Notes (Signed)
[facility]-EMERGENCY DEPT Provider Note   CSN: [id number] Arrival date & time: 01/06/21  2335     History Chief Complaint  Patient presents with  . Anxiety  . Shortness of Breath    [name] is a 30 y.o. male with a history of depression and schizophrenia who presents to the emergency department by EMS with a chief complaint of shortness of breath.  EMS reports that the patient's oxygen saturation was 93% upon arrival.  He was given an albuterol nebulizer and oxygen saturation increased to 100%.  Per EMS, the patient was feeling anxious and unable to think clearly.  EMS noted that the patient becomes more anxious when females are present.   On my evaluation, the patient reports that he has been having shortness of breath for "a long time", but he is more concerned with the fact that he "needs help with his mind".  No known aggravating or alleviating factors.  He denies chest pain.  He has no other complaints at this time. He denies SI, HI, or auditory visual hallucinations.  Per chart review, patient has had 13 visits to the ER in the last 6 months.  This is the patient's sixth visit to the ER in the last 10 days.  Spoke with [name] with the patient's ACT team.  She reports that the patient has a fully furnished apartment in [facility], but typically refuses to stay there.  He is not taking his home medications.  She reports that he has had a stepwise progression and paranoia for some time.  She reports that she has become increasingly worried when the patient is around children as this will trigger his paranoia and he will become increasingly agitated.  She spoke with the behavioral health nurse practitioner earlier this week after the patient was briefly admitted by behavioral health.  He was refusing his injectable Invega.  They did send him home with a prescription of p.o. Invega, but the patient has not been taking it.  The ACT team has IVC'ed previously, but every  time he is seen and evaluated in the ER by behavioral health he typically gets discharged within a day or couple of days.  She reports that his mother passed away within the last few months.  The patient has not acknowledged this death.  He continues to act as if his mother has not passed away.  She reports that he regularly uses marijuana, which seems to worsen his symptoms.  Level 5 caveat secondary to psychiatric disorder.   The history is provided by the patient, medical records and a caregiver. The history is limited by the condition of the patient and the absence of a caregiver. No language interpreter was used.       Past Medical History:  Diagnosis Date  . Depression   . Schizophrenia [facility])     Patient Active Problem List   Diagnosis Date Noted  . 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 Schizophrenia, paranoid type (HCC) 10/24/2020  . Other schizophrenia (HCC) 02/14/2018  . Cannabis use disorder, moderate, dependence (HCC) 05/10/2016    Past Surgical History:  Procedure Laterality Date  . BACK SURGERY         History reviewed. No pertinent family history.  Social History   Tobacco Use  . Smoking status: Current Some Day Smoker    Types: Cigarettes  . Smokeless tobacco: Never Used  Vaping Use  . Vaping Use: Never used  Substance Use Topics  . Alcohol use:  No  . Drug use: Yes    Types: Marijuana    Comment: rare    Home Medications Prior to Admission medications   Medication Sig Start Date End Date Taking? Authorizing Provider  paliperidone (INVEGA) 6 MG 24 hr tablet Take 1 tablet (6 mg total) by mouth daily for 14 days. 01/02/21 01/16/21  Mare Ferrari, PA-C    Allergies    Shellfish allergy and Bee pollen  Review of Systems   Review of Systems  Unable to perform ROS: Psychiatric disorder    Physical Exam Updated Vital Signs BP 139/83 (BP Location: Left Arm)   Pulse 65   Temp 97.7 F (36.5 C) (Oral)   Resp 15   SpO2 92%   Physical  Exam Vitals and nursing note reviewed.  Constitutional:      General: He is not in acute distress.    Appearance: He is well-developed. He is not ill-appearing, toxic-appearing or diaphoretic.     Comments: No acute distress.  HENT:     Head: Normocephalic.  Eyes:     Conjunctiva/sclera: Conjunctivae normal.  Cardiovascular:     Rate and Rhythm: Normal rate and regular rhythm.     Pulses: Normal pulses.     Heart sounds: Normal heart sounds. No murmur heard. No friction rub. No gallop.   Pulmonary:     Effort: Pulmonary effort is normal. No respiratory distress.     Breath sounds: Normal breath sounds. No stridor. No wheezing, rhonchi or rales.     Comments: Lungs are clear to auscultation bilaterally.  No increased work of breathing.  Patient is able to speak in complete, fluent sentences.  Peripheral pulses are intact. Chest:     Chest wall: No tenderness.  Abdominal:     General: There is no distension.     Palpations: Abdomen is soft. There is no mass.     Tenderness: There is no abdominal tenderness. There is no right CVA tenderness, left CVA tenderness, guarding or rebound.     Hernia: No hernia is present.  Musculoskeletal:        General: No tenderness.     Cervical back: Neck supple.     Right lower leg: No edema.     Left lower leg: No edema.  Skin:    General: Skin is warm and dry.  Neurological:     Mental Status: He is alert.  Psychiatric:        Behavior: Behavior normal.     ED Results / Procedures / Treatments   Labs (all labs ordered are listed, but only abnormal results are displayed) Labs Reviewed - No data to display  EKG None  Radiology No results found.  Procedures Procedures   Medications Ordered in ED Medications - No data to display  ED Course  I have reviewed the triage vital signs and the nursing notes.  Pertinent labs & imaging results that were available during my care of the patient were reviewed by me and considered in my  medical decision making (see chart for details).    MDM Rules/Calculators/A&P                          30 year old male with history of paranoid schizophrenia and depression who presents to the emergency department by EMS.  He initially called out for shortness of breath and anxiety.  EMS gave the patient a nebulizer treatment in route.  On arrival, oxygen saturation is in the  upper 90s.  Lungs are clear to auscultation bilaterally.  Patient denies any acute shortness of breath.  He adamantly denies chest pain or palpitations.  Clinically, his exam is reassuring.  His primary concern is "needing help with his mind.   The patient has had labs in the ER on 3/27 and 3/24.  Labs are stable.  With normal vital signs and a reassuring exam, no indication to repeat labs at this time.  I have a low suspicion for ACS, tension pneumothorax, PE, aortic dissection, acute CHF, asthma or COPD exacerbation, or pneumonia at this time.  I spoke with the patient's ACT team member, [name], and discussed that I plan to speak directly with the behavioral health nurse practitioner regarding the patient's increased ED visits and noncompliance with his home medication as well as concerns from his ACT team.  We discussed a plan for safe disposition for the patient to decrease ER utilization and [name] stated that if the patient is not admitted to behavioral health then a member of his act team will pick him up from the emergency department in the morning.   I spoke directly with [name], Behavioral Health NP.  Currently, the patient is denying SI, HI, or auditory or visual hallucinations.  Discussed concerns with increasing paranoia and noncompliance with medication as well as concern for that children trigger the patient's paranoia.  However, since the patient has been in my care, he has been cooperative and compliant.  He states the patient does not meet inpatient criteria and previously had a behavioral health admission  earlier this week.  Since arrival, patient has been resting comfortably.  He has had no agitation or aggression.  On reevaluation, he has no complaints at this time.  Spoke with RN.  She will call [name] at shift change to let her know that the patient will need picked up from the ER since he will not be admitted to [facility].  Patient care transferred to PA Sofia  at the end of my shift. Patient presentation, ED course, and plan of care discussed with review of all pertinent labs and imaging. Please see his/her note for further details regarding further ED course and disposition.   Final Clinical Impression(s) / ED Diagnoses Final diagnoses:  Paranoid schizophrenia [facility])    Rx / DC Orders ED Discharge Orders    None       Barkley Boards, PA-C 01/07/21 0630    Horton, Mayer Masker, MD 01/07/21 [phone]

## 2021-01-07 NOTE — ED Notes (Signed)
ACT returned call. Will be at [facility] ED at [facility] for transport back home.

## 2021-01-07 NOTE — ED Notes (Signed)
Patient will be holding in the ED for the night. [name] [phone] who Is a part of ACT team working with patient is to be contacted in the morning for update and to arrange patient pick up.

## 2021-01-07 NOTE — ED Notes (Signed)
Assumed care of pt at this time. Pt resting in stretcher, no s/sx of acute distress at this time.  

## 2021-01-07 NOTE — ED Notes (Signed)
[name] with ACT team was left a verbal message on answering service in regards to pick up arrangements for this patient.  Awaiting for return phone call

## 2021-01-08 ENCOUNTER — Ambulatory Visit (HOSPITAL_COMMUNITY)
Admission: AD | Admit: 2021-01-08 | Discharge: 2021-01-08 | Disposition: A | Discharge status: Home / Self Care | Payer: Medicaid Other | Source: Home / Self Care | Attending: Psychiatry | Admitting: Psychiatry

## 2021-01-08 ENCOUNTER — Other Ambulatory Visit: Payer: Self-pay

## 2021-01-08 DIAGNOSIS — F200 Paranoid schizophrenia: Secondary | ICD-10-CM | POA: Insufficient documentation

## 2021-01-08 DIAGNOSIS — Z91128 Patient's intentional underdosing of medication regimen for other reason: Secondary | ICD-10-CM | POA: Insufficient documentation

## 2021-01-08 DIAGNOSIS — Z20822 Contact with and (suspected) exposure to covid-19: Secondary | ICD-10-CM | POA: Insufficient documentation

## 2021-01-08 NOTE — H&P (Signed)
Behavioral Health Medical Screening Exam  [name] is an 30 y.o. male with a history of schizophrenia. Patient presented voluntarily to [facility] with complaint of paranoia. Patient reports "I'm scared for my life and I need a chill pill for my mind to feel relaxed so I can focus on life." When questioned if he feels people/someone is going to hurt him; he reponded "I don't know." He reports that he is not taking any medication although review of chart indicates he was written a prescription for Paliperidon 6mg  (Invega) PO on 01/02/21. He appears to be thought blocking as he answers "I don't know" to several assessment questions. He reports that he has no identifiable triggers/stressors. He denies suicidal ideation, homicidal ideation, auditory and visual hallucinations. Patient does not appear to be responding to internal/external stimuli. There is no evidence of delusion. He denies alcohol consumption. He admits to using marijuana as he reports "I'm high right now." He denies other illicit drug use. Patient reports that he has a safe place to live. Review of chart shows that patient has had multiple ED as well as [facility] visits over that past few weeks. It also indicated that patient is non-compliant with his medication and per his ACT team 01/04/21), patient has a fully furnitured apartment in [facility] but doesn't want to live there and chooses to stay [place] abandon buildings.   Patient was assessed face to face by this [place]. Patient is alert, calm, and cooperative with assessment; this writer is unable to determine patient's level of orientation as he answers most questions by stating "I don't know." He is well groomed, speech is slightly mumbled, normal rate and tone. He appears to be thought blocking, his mood/affect is anxious, and appear to have shallow insight.   TTS counsel For Clinical research associate, [facility] made contact with patient's ACT team. Please see his note below:   Pt is followed by [name]. TTS called  [facility] with [name] ACTT at  [phone]. She says Pt's primary problem is he will not take psychiatric medications and has not taken medications regularly in a year. She expresses concern that Pt is increasingly paranoid. She reports that she has become increasingly worried when the patient is around children as this will trigger his paranoia and he will become increasingly agitated.She says Pt's aunt is his payee and he frequently says he does not have money for food. [phone] reports that APS was contacted and said there was not enough evidence to start an investigation. She says Pt has been petitioned for involuntary commitment in the past but typically only is hospitalized 1-2 days.  Total Time spent with patient: 20 minutes  Psychiatric Specialty Exam: Physical Exam Constitutional:      General: He is not in acute distress.    Appearance: Normal appearance. He is not ill-appearing, toxic-appearing or diaphoretic.  HENT:     Head: Normocephalic and atraumatic.     Nose: No rhinorrhea.     Mouth/Throat:     Mouth: Mucous membranes are dry.  Eyes:     General:        Right eye: No discharge.        Left eye: No discharge.  Cardiovascular:     Rate and Rhythm: Bradycardia present.     Pulses: Normal pulses.  Pulmonary:     Effort: Pulmonary effort is normal. No respiratory distress.  Musculoskeletal:        General: Normal range of motion.     Cervical back: No  rigidity.  Skin:    Coloration: Skin is not jaundiced.  Neurological:     Mental Status: He is alert. Mental status is at baseline.  Psychiatric:        Attention and Perception: He does not perceive auditory or visual hallucinations.        Mood and Affect: Mood and affect normal.        Speech: Speech normal.        Behavior: Behavior is not agitated, slowed, aggressive, withdrawn, hyperactive or combative. Behavior is cooperative.        Thought Content: Thought content is paranoid. Thought content is not delusional.  Thought content does not include homicidal or suicidal ideation. Thought content does not include homicidal or suicidal plan.        Judgment: Judgment normal.    Review of Systems  Constitutional: Negative for activity change, fatigue and fever.  HENT: Negative for ear pain, facial swelling and rhinorrhea.   Eyes: Negative for photophobia, pain, discharge, redness, itching and visual disturbance.  Respiratory: Negative for apnea, cough, choking, chest tightness, shortness of breath and stridor.   Cardiovascular: Negative for chest pain.  Gastrointestinal: Negative for abdominal pain, nausea and vomiting.  Endocrine: Negative.   Genitourinary: Negative.   Musculoskeletal: Negative.   Skin: Negative for color change.  Neurological: Negative for dizziness, tremors, syncope, facial asymmetry, speech difficulty, weakness and light-headedness.  Psychiatric/Behavioral: Negative for agitation, hallucinations, self-injury and suicidal ideas.   There were no vitals taken for this visit.There is no height or weight on file to calculate BMI. General Appearance: Well Groomed Eye Contact:  Good Speech:  mumble Volume:  Normal Mood:  Anxious Affect:  Congruent Thought Process:  NA Orientation:  Other:  unable to assess Thought Content:  unable to assess Suicidal Thoughts:  No Homicidal Thoughts:  No Memory:  unable to assess Judgement:  Poor Insight:  Shallow Psychomotor Activity:  Normal Concentration: Concentration: Fair and Attention Span: Fair Recall:  unable to assess Fund of Knowledge:unable to assess Language: Fair Akathisia:  No Handed:  Right AIMS (if indicated):    Assets:  Desire for Improvement Financial Resources/Insurance Housing Sleep:     Musculoskeletal: Strength & Muscle Tone: within normal limits Gait & Station: normal Patient leans: Right  There were no vitals taken for this visit.  Recommendations: Based on my evaluation the patient does not appear to have  an emergency medical condition. Will transfer patient to [facility] for overnight observation.   Maricela Bo, NP 01/08/2021, 11:17 PM

## 2021-01-08 NOTE — BH Assessment (Signed)
Comprehensive Clinical Assessment (CCA) Note  01/08/2021 [name] [id number]  DISPOSITION: Completed CCA with Adrian Asper, NP who completed MSE and recommended Pt be transferred to [facility] for continuous assessment. Contacted [facility] staff and notified of transfer.  The patient demonstrates the following risk factors for suicide: Chronic risk factors for suicide include: psychiatric disorder of schizophenia. Acute risk factors for suicide include: death of mother. Protective factors for this patient include: positive therapeutic relationship. Considering these factors, the overall suicide risk at this point appears to be low. Patient is appropriate for outpatient follow up.  Flowsheet Row [facility] from 01/02/2021 in [facility] from 12/30/2020 in [facility] EMERGENCY DEPARTMENT  C-SSRS RISK CATEGORY No Risk No Risk No Risk     Pt is a 30 year old single male who presents unaccompanied to [facility] after being transported voluntarily via Patent examiner. Pt has a diagnosis of schizophrenia. He reports he asked to be brought to [facility] because "I'm scared for my life." Pt cannot identify who wants to harm him but does not feel safe. He is a poor historian and responds "I don't know... I don't know" to most questions. Per medical record, Pt has presented 6 times previously to the ED in the past 10 days with similar symptoms and was seen yesterday. He denies problems with sleep or appetite. He denies current suicidal ideation. He denies current thoughts of harming others. He denies visual hallucinations but appears conflicted when asked whether he is hearing voices. Pt reports he smokes marijuana daily and say, "I am high right now." He denies other substance use.  Pt cannot identify any stressors other than "Life." He says he does have a place to live and Pt's medical record indicates he has  a furnished apartment in [facility] [place] [facility] but does not stay there often. Pt's medical record indicates he mother recently died but Pt is not acknowledging her death. Pt's medical record indicates Pt was last psychiatrically hospitalized in May 2021 at [facility].  Pt is followed by [facility]. TTS called [name] at  [phone]. She says Pt's primary problem is he will not take psychiatric medications and has not taken medications regularly in a year. She expresses concern that Pt is increasingly paranoid. She reports that she has become increasingly worried when the patient is around children as this will trigger his paranoia and he will become increasingly agitated. She says Pt's aunt is his payee and he frequently says he does not have money for food. [name] reports that APS was contacted and said there was not enough evidence to start an investigation. She says Pt has been petitioned for involuntary commitment in the past but typically only is hospitalized 1-2 days.  Pt is neatly dressed and well-groomed. He is alert and oriented x4. Pt speaks in a mumbled tone, at moderate volume and normal pace. Motor behavior appears slightly restless. Eye contact is intense. Pt's mood is anxious and affect is congruent with mood. Thought process is coherent with flight of ideas. Pt's insight and judgment are limited. Pt was cooperative throughout assessment.  Additional information documented by [name] on 12/23/2020:  Per [name], pt may receive ACT Team services from [facility], and he may receive IM Invega.  At her request I called the [facility] ACT Team at 10:14.  They report that pt is supposed to receive a 117 mg injection, but has  not had it in at least 6 months.  They report that pt historically requests stimulant medications for putative ADHD, but pt has never been diagnosed with this disorder.  I was advised to speak to pt's [facility] therapist, [name] [phone]) for further details.  Chart  indicates that pt has a legal guardian, however, I was not able to find a letter of guardianship in his record.  I then called the [facility] and was routed to pt's care coordinator, [name] [phone]).  She reports that pt does not have a guardian, but that pt's aunt, [name] ([phone]) is his payee.  Clydie BraunKaren reports that there is reason to believe that Adrian does not handle this responsibility in pt's interest, however, APS refuses to take a report about the case.  I then spoke to [place]Stephanie with [facility], who confirms these details.  She reports that due to pt's paranoia, he typically refuses Invega injection, but she believes that receiving it would greatly improve his reality testing and would help him to cooperate with ACT Team services and decision making in general.  For instance, pt receives funding for housing through [facility], and in fact, has a dwelling, but he does not live there, choosing instead to stay at an abandoned apartment complex where he is more difficult to locate.  All of these details have been shared with Adrian Fermoina, and pt's EPIC record has been updated to reflect his guardianship standing and his best contacts.     Chief Complaint:  Chief Complaint  Patient presents with  . Psychiatric Evaluation   Visit Diagnosis: F20.9 Schizophrenia   CCA Screening, Triage and Referral (STR)  Patient Reported Information How did you hear about us? Self ([facility])  Referral [place]: [facility] [employer]  Referral phone number: 0 (N/A)   Whom do you see for routine medical problems? I don't have a doctor  Practice/Facility Name: No data recorded Practice/Facility Phone Number: No data recorded Name of Contact: No data recorded Contact Number: No data recorded Contact Fax Number: No data recorded Prescriber Name: No data recorded Prescriber Address (if known): No data recorded  What Is the Reason for Your Visit/Call Today? [facility] but  he does not know why he [facility].  He says "I don't know to most questions.  How Long Has This Been Causing You Problems? 1-6 months  What Do You Feel Would Help You the Most Today? Treatment for Depression or other mood problem   Have You Recently Been in Any Inpatient Treatment (Hospital/Detox/Crisis Center/28-Day Program)? No  Name/Location of Program/Hospital:[facility]  How Long Were You There? Per chart, "2 days."  When Were You Discharged? 02/24/2020 (Per chart.)   Have You Ever Received Services From [facility] Before? Yes  Who Do You See at [facility]? ED and [facility]   Have You Recently Had Any Thoughts About Hurting Yourself? No  Are You Planning to Commit Suicide/Harm Yourself At This time? No   Have you Recently Had Thoughts About Hurting Someone Karolee Ohslse? No  Explanation: No data recorded  Have You Used Any Alcohol or Drugs in the Past 24 Hours? No  How Long Ago Did You Use Drugs or Alcohol? 0000 (Unclear)  What Did You Use and How Much? Initially admits to ETOH and THC use, now denies   Do You Currently Have a Therapist/Psychiatrist? No (Pt denies but it is in the chart that he has [facility] services through [facility])  Name of Therapist/Psychiatrist: ACTT Team -  agency name unknown   Have You Been Recently Discharged From Any Office Practice or Programs? No  Explanation of Discharge From Practice/Program: No data recorded    CCA Screening Triage Referral Assessment Type of Contact: Tele-Assessment  Is this Initial or Reassessment? Initial Assessment  Date Telepsych consult ordered in [facility]:  12/31/2020  Time Telepsych consult ordered in [facility]:  0104   Patient Reported Information Reviewed? Yes  Patient Left Without Being Seen? No data recorded Reason for Not Completing Assessment: TTS cart currently in use, 2 pt's ahead of this consult. TTS to assess pt when cart is available    Collateral Involvement: Pt denies wanting clinician to contact famiy  members.   Does Patient Have a Automotive engineer Guardian? No data recorded Name and Contact of Legal Guardian: Adrian Frieze, legal guardian.   If Minor and Not Living with Parent(s), Who has Custody? N/A  Is CPS involved or ever been involved? Never  Is APS involved or ever been involved? Never   Patient Determined To Be At Risk for Harm To Self or Others Based on Review of Patient Reported Information or Presenting Complaint? No  Method: No Plan  Availability of Means: No access or NA  Intent: Vague intent or NA  Notification Required: No need or identified person  Additional Information for Danger to Others Potential: Active psychosis  Additional Comments for Danger to Others Potential: History of children playing outside triggering patient.  Are There Guns or Other Weapons in Your Home? No  Types of Guns/Weapons: No data recorded Are These Weapons Safely Secured?                            No data recorded Who Could Verify You Are Able To Have These Secured: No data recorded Do You Have any Outstanding Charges, Pending Court Dates, Parole/Probation? None noted  Contacted To Inform of Risk of Harm To Self or Others: Law Enforcement ([employer] is aware of pt's mental health concerns)   Location of Assessment: [facility] ED   Does Patient Present under Involuntary Commitment? No  IVC Papers Initial File Date: 10/22/2020   [place] of Residence: [place]   Patient Currently Receiving the Following Services: [facility] Psychologist, educational)   Determination of Need: Urgent (48 hours)   Options For Referral: Other: Comment (Pt does not meet inpatient care criteria.)     CCA Biopsychosocial Intake/Chief Complaint:  Pt has diagnosis of schizophrenia and is not taking medications. He says he "fears for his life" and cannot explain why. Pt reports cannabis use. He denies current suicidal ideation or homicidal ideation.  Current Symptoms/Problems: Pt seens unable to  articulate what is bothering him.  Says "I don't know" to most questions.   Patient Reported Schizophrenia/Schizoaffective Diagnosis in Past: Yes   Strengths: Not assessed  Preferences: Not assessed  Abilities: Not assessed   Type of Services Patient Feels are Needed: Pt states he needs "a chill pill" to help him focus.   Initial Clinical Notes/Concerns: Pt has presented to EDs 7 times in the past 10 days with similar symptoms.   Mental Health Symptoms Depression:  Difficulty Concentrating; Sleep (too much or little)   Duration of Depressive symptoms: Greater than two weeks   Mania:  Increased Energy; Irritability   Anxiety:   Restlessness; Tension; Difficulty concentrating   Psychosis:  Delusions   Duration of Psychotic symptoms: Greater than six months   Trauma:  Difficulty staying/falling asleep  Obsessions:  None   Compulsions:  None   Inattention:  None   Hyperactivity/Impulsivity:  N/A   Oppositional/Defiant Behaviors:  None   Emotional Irregularity:  None   Other Mood/Personality Symptoms:  None noted    Mental Status Exam Appearance and self-care  Stature:  Average   Weight:  Average weight   Clothing:  Neat/clean   Grooming:  Normal   Cosmetic use:  None   Posture/gait:  Normal   Motor activity:  Restless   Sensorium  Attention:  Inattentive   Concentration:  Anxiety interferes   Orientation:  Person; Place; Situation   Recall/memory:  Defective in Immediate; Defective in Short-term; Defective in Recent; Defective in Remote   Affect and Mood  Affect:  Anxious   Mood:  Anxious   Relating  Eye contact:  Normal   Facial expression:  Constricted   Attitude toward examiner:  Suspicious   Thought and Language  Speech flow: Blocked   Thought content:  Delusions   Preoccupation:  None   Hallucinations:  Auditory   Organization:  No data recorded  [employer] of Knowledge:  Fair   Intelligence:   Average   Abstraction:  -- (Unable to assess)   Judgement:  Impaired   Reality Testing:  Variable   Insight:  Lacking; Poor   Decision Making:  Impulsive   Social Functioning  Social Maturity:  Irresponsible   Social Judgement:  Naive   Stress  Stressors:  Other (Comment) ("Life")   Coping Ability:  Overwhelmed; Deficient supports   Skill Deficits:  Decision making; Responsibility; Self-control; Interpersonal   Supports:  Friends/Service system     Religion: Religion/Spirituality Are You A Religious Person?: No How Might This Affect Treatment?: NA  Leisure/Recreation:    Exercise/Diet: Exercise/Diet Do You Exercise?: No Have You Gained or Lost A Significant Amount of Weight in the Past Six Months?: No Do You Follow a Special Diet?: No Do You Have Any Trouble Sleeping?: No   CCA Employment/Education Employment/Work Situation: Employment / Work Situation Employment situation: On disability Why is patient on disability: Schizophrenia How long has patient been on disability: Unknown What is the longest time patient has a held a job?: Not assessed Where was the patient employed at that time?: Not assessed Has patient ever been in the [employer]?: No  Education: Education Is Patient Currently Attending School?: No Last Grade Completed: 9 Name of High School: Not assessed Did Garment/textile technologist From [employer]?: No Did You Product manager?: No Did Designer, television/film set?: No Did You Have Any Special Interests In School?: Not assessed Did You Have An Individualized Education Program (IIEP): No Did You Have Any Difficulty At School?: No Patient's Education Has Been Impacted by Current Illness: No   CCA Family/Childhood History Family and Relationship History: Family history Marital status: Single What is your sexual orientation?: Not assessed Has your sexual activity been affected by drugs, alcohol, medication, or emotional stress?: Not  assessed  Childhood History:  Childhood History Additional childhood history information: Not assessed Description of patient's relationship with caregiver when they were a child: Not assessed Patient's description of current relationship with people who raised him/her: Not assessed How were you disciplined when you got in trouble as a child/adolescent?: Not assessed Was the patient ever a victim of a crime or a disaster?: No Witnessed domestic violence?: No Has patient been affected by domestic violence as an adult?: No  Child/Adolescent Assessment:     CCA Substance Use Alcohol/Drug Use:  Alcohol / Drug Use Pain Medications: see MAR Prescriptions: see MAR Over the Counter: see MAR History of alcohol / drug use?: Yes Longest period of sobriety (when/how long): Unknown Substance #1 Name of Substance 1: Marijuana 1 - Age of First Use: unknown 1 - Amount (size/oz): unknown 1 - Frequency: daily 1 - Duration: ongoing 1 - Last Use / Amount: 01/08/2021 1 - Method of Aquiring: unknown 1- Route of Use: smoking                       ASAM's:  Six Dimensions of Multidimensional Assessment  Dimension 1:  Acute Intoxication and/or Withdrawal Potential:      Dimension 2:  Biomedical Conditions and Complications:      Dimension 3:  Emotional, Behavioral, or Cognitive Conditions and Complications:     Dimension 4:  Readiness to Change:     Dimension 5:  Relapse, Continued use, or Continued Problem Potential:     Dimension 6:  Recovery/Living Environment:     ASAM Severity Score:    ASAM Recommended Level of Treatment:     Substance use Disorder (SUD)    Recommendations for Services/Supports/Treatments:    DSM5 Diagnoses: Patient Active Problem List   Diagnosis Date Noted  . 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 Schizophrenia, paranoid type (HCC) 10/24/2020  . Other schizophrenia (HCC) 02/14/2018  . Cannabis use disorder, moderate, dependence (HCC)  05/10/2016    Patient Centered Plan: Patient is on the following Treatment Plan(s):  Anxiety   Referrals to Alternative Service(s): Referred to Alternative Service(s):   Place:   Date:   Time:    Referred to Alternative Service(s):   Place:   Date:   Time:    Referred to Alternative Service(s):   Place:   Date:   Time:    Referred to Alternative Service(s):   Place:   Date:   Time:     Pamalee Leyden, [facility]

## 2021-01-09 ENCOUNTER — Encounter (HOSPITAL_COMMUNITY): Payer: Self-pay | Admitting: Student

## 2021-01-09 ENCOUNTER — Ambulatory Visit (HOSPITAL_COMMUNITY)
Admission: EM | Admit: 2021-01-09 | Discharge: 2021-01-09 | Disposition: A | Discharge status: Home / Self Care | Payer: Medicaid Other | Attending: Student | Admitting: Student

## 2021-01-09 DIAGNOSIS — F200 Paranoid schizophrenia: Secondary | ICD-10-CM

## 2021-01-09 LAB — RESP PANEL BY RT-PCR (FLU A&B, COVID) ARPGX2
Influenza A by PCR: NEGATIVE
Influenza B by PCR: NEGATIVE
SARS Coronavirus 2 by RT PCR: NEGATIVE

## 2021-01-09 LAB — CBC WITH DIFFERENTIAL/PLATELET
Abs Immature Granulocytes: 0.01 10*3/uL (ref 0.00–0.07)
Basophils Absolute: 0.1 10*3/uL (ref 0.0–0.1)
Basophils Relative: 1 %
Eosinophils Absolute: 1.3 10*3/uL — ABNORMAL HIGH (ref 0.0–0.5)
Eosinophils Relative: 19 %
HCT: 39.6 % (ref 39.0–52.0)
Hemoglobin: 13.6 g/dL (ref 13.0–17.0)
Immature Granulocytes: 0 %
Lymphocytes Relative: 50 %
Lymphs Abs: 3.5 10*3/uL (ref 0.7–4.0)
MCH: 30.9 pg (ref 26.0–34.0)
MCHC: 34.3 g/dL (ref 30.0–36.0)
MCV: 90 fL (ref 80.0–100.0)
Monocytes Absolute: 0.5 10*3/uL (ref 0.1–1.0)
Monocytes Relative: 7 %
Neutro Abs: 1.6 10*3/uL — ABNORMAL LOW (ref 1.7–7.7)
Neutrophils Relative %: 23 %
Platelets: 235 10*3/uL (ref 150–400)
RBC: 4.4 MIL/uL (ref 4.22–5.81)
RDW: 12.9 % (ref 11.5–15.5)
WBC: 6.9 10*3/uL (ref 4.0–10.5)
nRBC: 0 % (ref 0.0–0.2)

## 2021-01-09 LAB — COMPREHENSIVE METABOLIC PANEL
ALT: 14 U/L (ref 0–44)
AST: 18 U/L (ref 15–41)
Albumin: 3.8 g/dL (ref 3.5–5.0)
Alkaline Phosphatase: 43 U/L (ref 38–126)
Anion gap: 7 (ref 5–15)
BUN: 9 mg/dL (ref 6–20)
CO2: 29 mmol/L (ref 22–32)
Calcium: 9.2 mg/dL (ref 8.9–10.3)
Chloride: 103 mmol/L (ref 98–111)
Creatinine, Ser: 0.96 mg/dL (ref 0.61–1.24)
GFR, Estimated: >60 mL/min (ref >60)
Glucose, Bld: 133 mg/dL — ABNORMAL HIGH (ref 70–99)
Potassium: 3.8 mmol/L (ref 3.5–5.1)
Sodium: 139 mmol/L (ref 135–145)
Total Bilirubin: 0.6 mg/dL (ref 0.3–1.2)
Total Protein: 6 g/dL — ABNORMAL LOW (ref 6.5–8.1)

## 2021-01-09 MED ORDER — TRAZODONE HCL 50 MG PO TABS: 50.0000 mg | ORAL_TABLET | Freq: Every evening | ORAL | Status: DC | PRN

## 2021-01-09 MED ORDER — ALUM & MAG HYDROXIDE-SIMETH 200-200-20 MG/5ML PO SUSP: 30.0000 mL | ORAL | Status: DC | PRN

## 2021-01-09 MED ORDER — PALIPERIDONE ER 6 MG PO TB24: 6.0000 mg | ORAL_TABLET | Freq: Every day | ORAL | Status: DC

## 2021-01-09 MED ORDER — ACETAMINOPHEN 325 MG PO TABS: 650.0000 mg | ORAL_TABLET | Freq: Four times a day (QID) | ORAL | Status: DC | PRN

## 2021-01-09 MED ORDER — PALIPERIDONE ER 6 MG PO TB24
6.0000 mg | ORAL_TABLET | Freq: Every day | ORAL | Status: DC
  Administered 2021-01-09: 6 mg via ORAL
  Filled 2021-01-09: qty 1

## 2021-01-09 MED ORDER — PALIPERIDONE ER 3 MG PO TB24
3.0000 mg | ORAL_TABLET | Freq: Once | ORAL | Status: AC
  Administered 2021-01-09: 3 mg via ORAL
  Filled 2021-01-09: qty 1

## 2021-01-09 MED ORDER — MAGNESIUM HYDROXIDE 400 MG/5ML PO SUSP: 30.0000 mL | Freq: Every day | ORAL | Status: DC | PRN

## 2021-01-09 MED ORDER — HYDROXYZINE HCL 25 MG PO TABS: 25.0000 mg | ORAL_TABLET | Freq: Three times a day (TID) | ORAL | Status: DC | PRN

## 2021-01-09 NOTE — ED Notes (Signed)
Locker 19 

## 2021-01-09 NOTE — ED Notes (Signed)
Pt sleeping at present, no distress noted, monitoring for safety. 

## 2021-01-09 NOTE — ED Triage Notes (Signed)
Pt reports he is feeling paranoid and needs a chill pill for him to feel relaxed so he can focus on life.  Denies SI, HI or AVH.

## 2021-01-09 NOTE — ED Notes (Signed)
Resting with eyes closed. Rise and fall of chest noted. No new issues noted at this time. Will continue to monitor for safety 

## 2021-01-09 NOTE — ED Notes (Signed)
Educated pt on d/c instructions. verbalzied understanding. Ambulated per self to retrieve belongings. No SI, HI, or AVH. No s/s pain, discomfort, or acute distress. A&O x4. Escorted to front lobby and d/c into self custody. Gave bus pass for transport. Stable at d/c

## 2021-01-09 NOTE — ED Notes (Signed)
GIVEN LUNCH 

## 2021-01-09 NOTE — ED Provider Notes (Signed)
FBC/OBS ASAP Discharge Summary  Date and Time: 01/09/2021 12:17 PM  Name: [name]  MRN:  [id number]   Discharge Diagnoses:  Final diagnoses:  Schizophrenia, paranoid (HCC)    Subjective: Patient reports that he is feeling better.  He denies any suicidal or homicidal ideations and denies any hallucinations.  Patient does not endorse any paranoid stay.  Patient is discussed with his medications and he reports that he has been taking his medications at home including the Invega 6 mg.  Patient states that he feels that the medication may need to be increased to assist him with his paranoia.  Patient provides consent to contact his ACT team with [facility]. Contacted Carol with [facility].  We discussed the possibility of starting the patient on [place] however the patient adamantly refused to do an injection.  She was informed that the patient was requesting to have his Invega increased to 9 mg.  She reports that the patient's aunt has stated that he is refusing his medications but the patient will tell us that he is taking his medications.  She was informed that he will be discharging home today.  She states that we do not have to provide a prescription for him and that she will contact their psychiatrist and do a prescription for Invega 9 mg p.o. daily.  She states that her ACT team will follow-up with him within the next week.  Stay Summary: Patient is a 30 year old male with a history of schizophrenia who presented to [facility] and then transferred to the [facility] reporting paranoia that someone was coming after him and he was scared for his life.  Patient was admitted to the continuous observation unit for overnight assessment and restarted on his home medication of Invega 6 mg p.o. oral.  Patient slept through the night and today reports that he is feeling better.  Patient denied any suicidal or homicidal ideations and denied any hallucinations.  Patient also denied having any delusions or paranoia.   Patient's ACT team was contacted for collateral information and medication adjustments.  Therefore the patient had requested for his Invega to be increased to 9 mg daily.  She reported that she will contact their psychiatrist to send a prescription in for the patient and we did not need to provide the patient with a prescription.  Patient was informed to contact his ACT team for follow-up in the ACT team so reported that they will follow up with him within the next week.  Patient continued to deny any suicidal homicidal ideations and denies any hallucinations upon discharge.  Patient was provided with transportation resources upon discharge.  Total Time spent with patient: 30 minutes  Past Psychiatric History: Schizophrenia, MDD, paranoia, multiple ED visits, previous hospitalizations Past Medical History:  Past Medical History:  Diagnosis Date  . Depression   . Schizophrenia [facility])     Past Surgical History:  Procedure Laterality Date  . BACK SURGERY     Family History: History reviewed. No pertinent family history. Family Psychiatric History: None reported Social History:  Social History   Substance and Sexual Activity  Alcohol Use No     Social History   Substance and Sexual Activity  Drug Use Yes  . Types: Marijuana   Comment: rare    Social History   Socioeconomic History  . Marital status: Single    Spouse name: Not on file  . Number of children: Not on file  . Years of education: Not on file  .  Highest education level: Not on file  Occupational History  . Occupation: unemployed  Tobacco Use  . Smoking status: Current Some Day Smoker    Types: Cigarettes  . Smokeless tobacco: Never Used  Vaping Use  . Vaping Use: Never used  Substance and Sexual Activity  . Alcohol use: No  . Drug use: Yes    Types: Marijuana    Comment: rare  . Sexual activity: Yes    Birth control/protection: Condom  Other Topics Concern  . Not on file  Social History Narrative   **  Merged History Encounter **       UTA certain topics - tangential thought process, cannot answer clearly. Pt not sure about school; reportedly is homeless.    Social Determinants of Health   Financial Resource Strain: Not on file  Food Insecurity: Not on file  Transportation Needs: Not on file  Physical Activity: Not on file  Stress: Not on file  Social Connections: Not on file   SDOH:  SDOH Screenings   Alcohol Screen: Low Risk   . Last Alcohol Screening Score (AUDIT): 0  Depression (PHQ2-9): Medium Risk  . PHQ-2 Score: 6  Financial Resource Strain: Not on file  Food Insecurity: Not on file  Housing: Not on file  Physical Activity: Not on file  Social Connections: Not on file  Stress: Not on file  Tobacco Use: High Risk  . Smoking Tobacco Use: Current Some Day Smoker  . Smokeless Tobacco Use: Never Used  Transportation Needs: Not on file    Has this patient used any form of tobacco in the last 30 days? (Cigarettes, Smokeless Tobacco, Cigars, and/or Pipes) A prescription for an FDA-approved tobacco cessation medication was offered at discharge and the patient refused  Current Medications:  Current Facility-Administered Medications  Medication Dose Route Frequency Provider Last Rate Last Admin  . acetaminophen (TYLENOL) tablet 650 mg  650 mg Oral Q6H PRN Jaclyn Shaggy, PA-C      . alum & mag hydroxide-simeth (MAALOX/MYLANTA) 200-200-20 MG/5ML suspension 30 mL  30 mL Oral Q4H PRN Melbourne Abts W, PA-C      . hydrOXYzine (ATARAX/VISTARIL) tablet 25 mg  25 mg Oral TID PRN Jaclyn Shaggy, PA-C      . magnesium hydroxide (MILK OF MAGNESIA) suspension 30 mL  30 mL Oral Daily PRN Ladona Ridgel, Cody W, PA-C      . paliperidone (INVEGA) 24 hr tablet 3 mg  3 mg Oral Once Arelyn Gauer, Gerlene Burdock, FNP      . paliperidone (INVEGA) 24 hr tablet 6 mg  6 mg Oral Daily Melbourne Abts W, PA-C   6 mg at 01/09/21 0113  . traZODone (DESYREL) tablet 50 mg  50 mg Oral QHS PRN Jaclyn Shaggy, PA-C       Current  Outpatient Medications  Medication Sig Dispense Refill  . paliperidone (INVEGA) 6 MG 24 hr tablet Take 1 tablet (6 mg total) by mouth daily for 14 days. 14 tablet 0    PTA Medications: (Not in a hospital admission)   Musculoskeletal  Strength & Muscle Tone: within normal limits Gait & Station: normal Patient leans: N/A  Psychiatric Specialty Exam  Presentation  General Appearance: Appropriate for Environment; Disheveled  Eye Contact:Fair  Speech:Clear and Coherent; Normal Rate  Speech Volume:Normal  Handedness:Right   Mood and Affect  Mood:Euthymic  Affect:Appropriate; Congruent   Thought Process  Thought Processes:Coherent  Descriptions of Associations:Intact  Orientation:Full (Time, Place and Person)  Thought Content:WDL  Diagnosis of  Schizophrenia or Schizoaffective disorder in past: Yes  Duration of Psychotic Symptoms: Greater than six months   Hallucinations:Hallucinations: None  Ideas of Reference:None  Suicidal Thoughts:Suicidal Thoughts: No  Homicidal Thoughts:Homicidal Thoughts: No   Sensorium  Memory:Immediate Good; Recent Good; Remote Good  Judgment:Fair  Insight:Lacking   Executive Functions  Concentration:Fair  Attention Span:Fair  Recall:Fair  Fund of Knowledge:Fair  Language:Fair   Psychomotor Activity  Psychomotor Activity:Psychomotor Activity: Normal   Assets  Assets:Communication Skills; Desire for Improvement; Financial Resources/Insurance; Housing; Social Support; Physical Health   Sleep  Sleep:Sleep: Good Number of Hours of Sleep: 2   Nutritional Assessment (For OBS and FBC admissions only) Has the patient had a weight loss or gain of 10 pounds or more in the last 3 months?: No Has the patient had a decrease in food intake/or appetite?: No Does the patient have dental problems?: No Does the patient have eating habits or behaviors that may be indicators of an eating disorder including binging or inducing  vomiting?: No Has the patient recently lost weight without trying?: No Has the patient been eating poorly because of a decreased appetite?: No Malnutrition Screening Tool Score: 0    Physical Exam  Physical Exam Vitals and nursing note reviewed.  Constitutional:      Appearance: He is well-developed.  HENT:     Head: Normocephalic.  Eyes:     Pupils: Pupils are equal, round, and reactive to light.  Cardiovascular:     Rate and Rhythm: Normal rate.  Pulmonary:     Effort: Pulmonary effort is normal.  Musculoskeletal:        General: Normal range of motion.  Neurological:     Mental Status: He is alert and oriented to person, place, and time.    Review of Systems  Constitutional: Negative.   HENT: Negative.   Eyes: Negative.   Respiratory: Negative.   Cardiovascular: Negative.   Gastrointestinal: Negative.   Genitourinary: Negative.   Musculoskeletal: Negative.   Skin: Negative.   Neurological: Negative.   Endo/Heme/Allergies: Negative.   Psychiatric/Behavioral: Negative.    Blood pressure (!) 143/88, pulse 81, temperature 98.4 F (36.9 C), temperature source Oral, resp. rate 18, SpO2 99 %. There is no height or weight on file to calculate BMI.  Demographic Factors:  Male, Low socioeconomic status, Living alone and Unemployed  Loss Factors: NA  Historical Factors: NA  Risk Reduction Factors:   Positive social support, Positive therapeutic relationship and Positive coping skills or problem solving skills  Continued Clinical Symptoms:  Alcohol/Substance Abuse/Dependencies Previous Psychiatric Diagnoses and Treatments  Cognitive Features That Contribute To Risk:  None    Suicide Risk:  Minimal: No identifiable suicidal ideation.  Patients presenting with no risk factors but with morbid ruminations; may be classified as minimal risk based on the severity of the depressive symptoms  Plan Of Care/Follow-up recommendations:  Continue activity as tolerated.  Continue diet as recommended by your PCP. Ensure to keep all appointments with outpatient providers.  Disposition: Discharge home  Maryfrances Bunnell, FNP 01/09/2021, 12:17 PM

## 2021-01-09 NOTE — Discharge Instructions (Addendum)

## 2021-01-09 NOTE — ED Provider Notes (Signed)
Behavioral Health Admission [facility](FBC & OBS)  Date: 01/09/21 Patient Name: [name] MRN: [id number] Chief Complaint: No chief complaint on file.     Diagnoses:  Final diagnoses:  Schizophrenia, paranoid (HCC)    HPI: [name] is a 30 year old male with a history of paranoid schizophrenia that has been assessed by our service multiple times in the past who presents to the behavioral health urgent care [facility].  [facility] a [facility].  Patient was assessed by provider and TTS and [facility] for the patient at that time. HPI per Ene Ajibola's 01/09/21 [facility] the [facility] shown below:   "[name] is an 30 y.o. male with a history of schizophrenia. [facility] with complaint of paranoia. Patient reports "I'm scared for my life and I need a chill pill for my mind to feel relaxed so I can focus on life." When questioned if he feels people/someone is going to hurt him; he reponded "I don't know." He reports that he is not taking any medication although review of chart indicates he was written a prescription for Paliperidon 6mg  (Invega) PO on 01/02/21. He appears to be thought blocking as he answers "I don't know" to several assessment questions. He reports that he has no identifiable triggers/stressors. He denies suicidal ideation, homicidal ideation, auditory and visual hallucinations. Patient does not appear to be responding to internal/external stimuli. There is no evidence of delusion. He denies alcohol consumption. He admits to using marijuana as he reports "I'm high right now." He denies other illicit drug use. Patient reports that he has a safe place to live. Review of chart shows that patient has had multiple ED as well as [facility] visits over that past few weeks. It also indicated that patient is non-compliant with his medication and per  his ACT team [name](Carol), [place] but doesn't want to live there and chooses to stay in abandon buildings.   Patient was assessed face to face by this Clinical research associatewriter. Patient is alert, calm, and cooperative with assessment; this writer is unable to determine patient's level of orientation as he answers most questions by stating "I don't know." He is well groomed, speech is slightly mumbled, normal rate and tone. He appears to be thought blocking, his mood/affect is anxious, and appear to have shallow insight.   TTS counsel For Davonna BellingWarrick, [facility] made contact with patient's ACT team. Please see his note below:   Pt is followed by [name]. TTS called Okey Regalarol with [facility] at [phone]. She says Pt's primary problem is he will not take psychiatric medications and has not taken medications regularly in a year. She expresses concern that Pt is increasingly paranoid. She reports that she has become increasingly worried when the patient is around children as this will trigger his paranoia and he will become increasingly agitated.She says Pt's aunt is his payee and he frequently says he does not have money for food. Okey RegalCarol reports that APS was contacted and said there was not enough evidence to start an investigation. She says Pt has been petitioned for involuntary commitment in the past but typically only is hospitalized 1-2 days."  Please also see Venda RodesFord Warrick, [facility] 01/08/21 Assessment note as well further details regarding TTS assessment [facility] if necessary.  [facility].  When patient is asked why he went to [facility] on 01[facility]08/[facility], patient states " I'm scared for my life".  When patient is asked to clarify this statement, patient states "I don't know, I'm just scared".  When patient is asked who he is scared of, he states "I don't know... People... I don't know".  Patient denies SI, HI, past suicide attempts, or past self  harming behaviors such as cutting or burning himself intentionally.  Patient denies visual hallucinations, but endorses auditory hallucinations.  When patient is asked to describe his auditory hallucinations, patient states "I don't know. I don't know. I don't know" and is unable to provide further details regarding his auditory hallucinations.  Patient states that he is diagnosed with schizophrenia, but he states "I'm getting over that though".  Patient endorses feeling paranoid and feeling that people are out to get him, but he is unable to provide further details regarding his paranoia.  Patient states that he is not taking any psychotropic medications currently and he cannot recall how long it has been since he last took psychotropic medications.  Per chart review, it appears that the patient was supposed to be receiving a monthly Invega injection and when the patient refused the injection, the patient was prescribed Invega 6 mg p.o.  Patient also states that he is unsure what his current status is with his ACTT team.  Patient states that he is currently homeless, but per chart review, it appears that the patient does have a home.  Patient denies access to guns or weapons.  Patient denies alcohol use, but he does endorse marijuana use.  When patient is asked about his frequency/amount of marijuana use, patient states "I don't know. I don't know".  Patient denies additional substance use.  Patient's last urine drug screen on December 30, 2020 was negative for all substances.  Patient is currently unemployed.  PHQ 2-9:  Flowsheet Row ED [facility]08/[facility]  Thoughts that you would be better off dead, or of hurting yourself in some way Not at all  PHQ-9 Total Score 6      Flowsheet Row [facility] from 01/02/2021 in [facility] DEPT ED from 12/30/2020 in [facility]  EMERGENCY DEPARTMENT  C-SSRS RISK CATEGORY No Risk No Risk No Risk       Total Time spent with patient: 30 minutes  Musculoskeletal  Strength & Muscle Tone: within normal limits Gait & Station: normal Patient leans: N/A  Psychiatric Specialty Exam  Presentation General Appearance: Disheveled  Eye Contact:Poor  Speech:Clear and Coherent; Normal Rate  Speech Volume:Normal  Handedness:Right   Mood and Affect  Mood:Anxious  Affect:Congruent   Thought Process  Thought Processes:Disorganized  Descriptions of Associations:Circumstantial  Orientation:Full (Time, Place and Person)  Thought Content:Paranoid Ideation  Diagnosis of Schizophrenia or Schizoaffective disorder in past: Yes  Duration of Psychotic Symptoms: Greater than six months  Hallucinations:Hallucinations: Auditory  Ideas of Reference:Paranoia  Suicidal Thoughts:Suicidal Thoughts: No  Homicidal Thoughts:Homicidal Thoughts: No   Sensorium  Memory:Immediate Fair; Recent Fair; Remote Fair  Judgment:Poor  Insight:Lacking   Executive Functions  Concentration:Fair  Attention Span:Fair  Recall:Fair  Fund of Knowledge:Fair  Language:Fair   Psychomotor Activity  Psychomotor Activity:Psychomotor Activity: Normal   Assets  Assets:Communication Skills; Desire for Improvement; Financial Resources/Insurance; Leisure Time; Physical Health   Sleep  Sleep:Sleep: Poor Number of Hours of Sleep: 2   Nutritional Assessment (For OBS and  FBC admissions only) Has the patient had a weight loss or gain of 10 pounds or more in the last 3 months?: No Has the patient had a decrease in food intake/or appetite?: No Does the patient have dental problems?: No Does the patient have eating habits or behaviors that may be indicators of an eating disorder including binging or inducing vomiting?: No Has the patient recently lost weight without trying?: No Has the patient been eating poorly because of a  decreased appetite?: No Malnutrition Screening Tool Score: 0    Physical Exam Constitutional:      General: He is not in acute distress.    Appearance: He is not ill-appearing, toxic-appearing or diaphoretic.  HENT:     Head: Normocephalic and atraumatic.     Right Ear: External ear normal.     Left Ear: External ear normal.  Cardiovascular:     Rate and Rhythm: Normal rate.  Pulmonary:     Effort: Pulmonary effort is normal. No respiratory distress.  Musculoskeletal:        General: Normal range of motion.     Cervical back: Normal range of motion.  Neurological:     Mental Status: He is alert and oriented to person, place, and time.  Psychiatric:        Attention and Perception: He perceives auditory hallucinations. He does not perceive visual hallucinations.        Mood and Affect: Mood is anxious.        Speech: Speech normal.        Behavior: Behavior is not agitated, slowed, aggressive, withdrawn, hyperactive or combative. Behavior is cooperative.        Thought Content: Thought content is paranoid. Thought content is not delusional. Thought content does not include homicidal or suicidal ideation.     Comments: Affect is mood congruent.  Patient is cooperative, but answers most questions with "I don't know".  Judgment poor and insight lacking.    Review of Systems  Constitutional: Negative for chills, diaphoresis, fever, malaise/fatigue and weight loss.  HENT: Negative for congestion.   Respiratory: Negative for cough and shortness of breath.   Cardiovascular: Negative for chest pain and palpitations.  Gastrointestinal: Negative for abdominal pain, constipation, diarrhea, nausea and vomiting.  Musculoskeletal: Negative for joint pain and myalgias.  Neurological: Negative for dizziness and headaches.  Psychiatric/Behavioral: Positive for hallucinations and substance abuse. Negative for depression, memory loss and suicidal ideas. The patient is nervous/anxious and has  insomnia.   All other systems reviewed and are negative.   Vitals: Blood pressure (!) 143/88, pulse 81, temperature 98.4 F (36.9 C), temperature source Oral, resp. rate 18, SpO2 99 %. There is no height or weight on file to calculate BMI.  Past Psychiatric History: Paranoid Schizophrenia    Is the patient at risk to self? Yes  Has the patient been a risk to self in the past 6 months? Yes .    Has the patient been a risk to self within the distant past? Yes   Is the patient a risk to others? No   Has the patient been a risk to others in the past 6 months? No   Has the patient been a risk to others within the distant past? No   Past Medical History:  Past Medical History:  Diagnosis Date  . Depression   . Schizophrenia [facility])     Past Surgical History:  Procedure Laterality Date  . BACK SURGERY      Family History:  History reviewed. No pertinent family history.  Social History:  Social History   Socioeconomic History  . Marital status: Single    Spouse name: Not on file  . Number of children: Not on file  . Years of education: Not on file  . Highest education level: Not on file  Occupational History  . Occupation: unemployed  Tobacco Use  . Smoking status: Current Some Day Smoker    Types: Cigarettes  . Smokeless tobacco: Never Used  Vaping Use  . Vaping Use: Never used  Substance and Sexual Activity  . Alcohol use: No  . Drug use: Yes    Types: Marijuana    Comment: rare  . Sexual activity: Yes    Birth control/protection: Condom  Other Topics Concern  . Not on file  Social History Narrative   ** Merged History Encounter **       UTA certain topics - tangential thought process, cannot answer clearly. Pt not sure about school; reportedly is homeless.    Social Determinants of Health   Financial Resource Strain: Not on file  Food Insecurity: Not on file  Transportation Needs: Not on file  Physical Activity: Not on file  Stress: Not on file  Social  Connections: Not on file  Intimate Partner Violence: Not on file    SDOH:  SDOH Screenings   Alcohol Screen: Low Risk   . Last Alcohol Screening Score (AUDIT): 0  Depression (PHQ2-9): Medium Risk  . PHQ-2 Score: 6  Financial Resource Strain: Not on file  Food Insecurity: Not on file  Housing: Not on file  Physical Activity: Not on file  Social Connections: Not on file  Stress: Not on file  Tobacco Use: High Risk  . Smoking Tobacco Use: Current Some Day Smoker  . Smokeless Tobacco Use: Never Used  Transportation Needs: Not on file    Last Labs:  Admission on 01/02/2021, Discharged on 01/02/2021  Component Date Value Ref Range Status  . SARS Coronavirus 2 by RT PCR 01/02/2021 NEGATIVE  NEGATIVE Final   Comment: (NOTE) SARS-CoV-2 target nucleic acids are NOT DETECTED.  The SARS-CoV-2 RNA is generally detectable in upper respiratory specimens during the acute phase of infection. The lowest concentration of SARS-CoV-2 viral copies this assay can detect is 138 copies/mL. A negative result does not preclude SARS-Cov-2 infection and should not be used as the sole basis for treatment or other patient management decisions. A negative result may occur with  improper specimen collection/handling, submission of specimen other than nasopharyngeal swab, presence of viral mutation(s) within the areas targeted by this assay, and inadequate number of viral copies(<138 copies/mL). A negative result must be combined with clinical observations, patient history, and epidemiological information. The expected result is Negative.  Fact Sheet for Patients:  BloggerCourse.com  Fact Sheet for Healthcare Providers:  SeriousBroker.it  This test is no                          t yet approved or cleared by the [place] FDA and  has been authorized for detection and/or diagnosis of SARS-CoV-2 by FDA under an Emergency Use Authorization (EUA).  This EUA will remain  in effect (meaning this test can be used) for the duration of the COVID-19 declaration under Section 564(b)(1) of the Act, 21 U.S.C.section 360bbb-3(b)(1), unless the authorization is terminated  or revoked sooner.      . Influenza A by PCR 01/02/2021 NEGATIVE  NEGATIVE Final  . Influenza  B by PCR 01/02/2021 NEGATIVE  NEGATIVE Final   Comment: (NOTE) The Xpert Xpress SARS-CoV-2/FLU/RSV plus assay is intended as an aid in the diagnosis of influenza from Nasopharyngeal swab specimens and should not be used as a sole basis for treatment. Nasal washings and aspirates are unacceptable for Xpert Xpress SARS-CoV-2/FLU/RSV testing.  Fact Sheet for Patients: BloggerCourse.com  Fact Sheet for Healthcare Providers: SeriousBroker.it  This test is not yet approved or cleared by the [place] FDA and has been authorized for detection and/or diagnosis of SARS-CoV-2 by FDA under an Emergency Use Authorization (EUA). This EUA will remain in effect (meaning this test can be used) for the duration of the COVID-19 declaration under Section 564(b)(1) of the Act, 21 U.S.C. section 360bbb-3(b)(1), unless the authorization is terminated or revoked.  Performed at [facility], [address] [place], [place] [address]   . Sodium 01/02/2021 133* 135 - 145 mmol/L Final  . Potassium 01/02/2021 3.9  3.5 - 5.1 mmol/L Final  . Chloride 01/02/2021 102  98 - 111 mmol/L Final  . CO2 01/02/2021 21* 22 - 32 mmol/L Final  . Glucose, Bld 01/02/2021 111* 70 - 99 mg/dL Final   Glucose reference range applies only to samples taken after fasting for at least 8 hours.  . BUN 01/02/2021 18  6 - 20 mg/dL Final  . Creatinine, Ser 01/02/2021 1.17  0.61 - 1.24 mg/dL Final  . Calcium 60/45/4098 9.1  8.9 - 10.3 mg/dL Final  . Total Protein 01/02/2021 6.9  6.5 - 8.1 g/dL Final  . Albumin 11/91/4782 4.2  3.5 - 5.0 g/dL Final  .  AST 95/62/1308 26  15 - 41 U/L Final  . ALT 01/02/2021 14  0 - 44 U/L Final  . Alkaline Phosphatase 01/02/2021 50  38 - 126 U/L Final  . Total Bilirubin 01/02/2021 0.9  0.3 - 1.2 mg/dL Final  . GFR, Estimated 01/02/2021 >60  >60 mL/min Final   Comment: (NOTE) Calculated using the CKD-EPI Creatinine Equation (2021)   . Anion gap 01/02/2021 10  5 - 15 Final   Performed at [facility], [address], [place], [place] [address]  . Alcohol, Ethyl (B) 01/02/2021 <10  <10 mg/dL Final   Comment: (NOTE) Lowest detectable limit for serum alcohol is 10 mg/dL.  For medical purposes only. Performed at [facility], [address], [place], [place] [address]   . WBC 01/02/2021 8.5  4.0 - 10.5 K/uL Final  . RBC 01/02/2021 4.85  4.22 - 5.81 MIL/uL Final  . Hemoglobin 01/02/2021 15.2  13.0 - 17.0 g/dL Final  . HCT 52/84/1324 43.9  39.0 - 52.0 % Final  . MCV 01/02/2021 90.5  80.0 - 100.0 fL Final  . MCH 01/02/2021 31.3  26.0 - 34.0 pg Final  . MCHC 01/02/2021 34.6  30.0 - 36.0 g/dL Final  . RDW 40/07/2724 12.8  11.5 - 15.5 % Final  . Platelets 01/02/2021 265  150 - 400 K/uL Final  . nRBC 01/02/2021 0.0  0.0 - 0.2 % Final  . Neutrophils Relative % 01/02/2021 39  % Final  . Neutro Abs 01/02/2021 3.3  1.7 - 7.7 K/uL Final  . Lymphocytes Relative 01/02/2021 43  % Final  . Lymphs Abs 01/02/2021 3.7  0.7 - 4.0 K/uL Final  . Monocytes Relative 01/02/2021 7  % Final  . Monocytes Absolute 01/02/2021 0.6  0.1 - 1.0 K/uL Final  . Eosinophils Relative 01/02/2021 10  % Final  .  Eosinophils Absolute 01/02/2021 0.8* 0.0 - 0.5 K/uL Final  . Basophils Relative 01/02/2021 0  % Final  . Basophils Absolute 01/02/2021 0.0  0.0 - 0.1 K/uL Final  . Immature Granulocytes 01/02/2021 1  % Final  . Abs Immature Granulocytes 01/02/2021 0.04  0.00 - 0.07 K/uL Final   Performed at [facility], [address] [place], [place] [address]  Admission on 12/30/2020,  Discharged on 12/31/2020  Component Date Value Ref Range Status  . Sodium 12/30/2020 138  135 - 145 mmol/L Final  . Potassium 12/30/2020 4.4  3.5 - 5.1 mmol/L Final  . Chloride 12/30/2020 101  98 - 111 mmol/L Final  . CO2 12/30/2020 28  22 - 32 mmol/L Final  . Glucose, Bld 12/30/2020 120* 70 - 99 mg/dL Final   Glucose reference range applies only to samples taken after fasting for at least 8 hours.  . BUN 12/30/2020 14  6 - 20 mg/dL Final  . Creatinine, Ser 12/30/2020 1.08  0.61 - 1.24 mg/dL Final  . Calcium 60/45/4098 9.6  8.9 - 10.3 mg/dL Final  . Total Protein 12/30/2020 6.6  6.5 - 8.1 g/dL Final  . Albumin 11/91/4782 4.1  3.5 - 5.0 g/dL Final  . AST 95/62/1308 22  15 - 41 U/L Final  . ALT 12/30/2020 17  0 - 44 U/L Final  . Alkaline Phosphatase 12/30/2020 43  38 - 126 U/L Final  . Total Bilirubin 12/30/2020 0.7  0.3 - 1.2 mg/dL Final  . GFR, Estimated 12/30/2020 >60  >60 mL/min Final   Comment: (NOTE) Calculated using the CKD-EPI Creatinine Equation (2021)   . Anion gap 12/30/2020 9  5 - 15 Final   Performed at [facility], [address] [place], [place] [address]  . Alcohol, Ethyl (B) 12/30/2020 <10  <10 mg/dL Final   Comment: (NOTE) Lowest detectable limit for serum alcohol is 10 mg/dL.  For medical purposes only. Performed at [facility], [address], [place], [place] [address]   . WBC 12/30/2020 6.4  4.0 - 10.5 K/uL Final  . RBC 12/30/2020 4.56  4.22 - 5.81 MIL/uL Final  . Hemoglobin 12/30/2020 14.4  13.0 - 17.0 g/dL Final  . HCT 52/84/1324 41.0  39.0 - 52.0 % Final  . MCV 12/30/2020 89.9  80.0 - 100.0 fL Final  . MCH 12/30/2020 31.6  26.0 - 34.0 pg Final  . MCHC 12/30/2020 35.1  30.0 - 36.0 g/dL Final  . RDW 40/07/2724 12.9  11.5 - 15.5 % Final  . Platelets 12/30/2020 256  150 - 400 K/uL Final  . nRBC 12/30/2020 0.0  0.0 - 0.2 % Final   Performed at [facility], [address] [place], [place] [address]  . Opiates 12/30/2020 NONE DETECTED   NONE DETECTED Final  . Cocaine 12/30/2020 NONE DETECTED  NONE DETECTED Final  . Benzodiazepines 12/30/2020 NONE DETECTED  NONE DETECTED Final  . Amphetamines 12/30/2020 NONE DETECTED  NONE DETECTED Final  . Tetrahydrocannabinol 12/30/2020 NONE DETECTED  NONE DETECTED Final  . Barbiturates 12/30/2020 NONE DETECTED  NONE DETECTED Final   Comment: (NOTE) DRUG SCREEN FOR MEDICAL PURPOSES ONLY.  IF CONFIRMATION IS NEEDED FOR ANY PURPOSE, NOTIFY LAB WITHIN 5 DAYS.  LOWEST DETECTABLE LIMITS FOR URINE DRUG SCREEN Drug Class                     Cutoff (ng/mL) Amphetamine and metabolites    1000 Barbiturate and metabolites  200 Benzodiazepine                 200 Tricyclics and metabolites     300 Opiates and metabolites        300 Cocaine and metabolites        300 THC                            50 Performed at [facility], [address], [place], [place] [address]   . SARS Coronavirus 2 by RT PCR 12/31/2020 NEGATIVE  NEGATIVE Final   Comment: (NOTE) SARS-CoV-2 target nucleic acids are NOT DETECTED.  The SARS-CoV-2 RNA is generally detectable in upper respiratory specimens during the acute phase of infection. The lowest concentration of SARS-CoV-2 viral copies this assay can detect is 138 copies/mL. A negative result does not preclude SARS-Cov-2 infection and should not be used as the sole basis for treatment or other patient management decisions. A negative result may occur with  improper specimen collection/handling, submission of specimen other than nasopharyngeal swab, presence of viral mutation(s) within the areas targeted by this assay, and inadequate number of viral copies(<138 copies/mL). A negative result must be combined with clinical observations, patient history, and epidemiological information. The expected result is Negative.  Fact Sheet for Patients:  BloggerCourse.com  Fact Sheet for Healthcare Providers:   SeriousBroker.it  This test is no                          t yet approved or cleared by the [place] FDA and  has been authorized for detection and/or diagnosis of SARS-CoV-2 by FDA under an Emergency Use Authorization (EUA). This EUA will remain  in effect (meaning this test can be used) for the duration of the COVID-19 declaration under Section 564(b)(1) of the Act, 21 U.S.C.section 360bbb-3(b)(1), unless the authorization is terminated  or revoked sooner.      . Influenza A by PCR 12/31/2020 NEGATIVE  NEGATIVE Final  . Influenza B by PCR 12/31/2020 NEGATIVE  NEGATIVE Final   Comment: (NOTE) The Xpert Xpress SARS-CoV-2/FLU/RSV plus assay is intended as an aid in the diagnosis of influenza from Nasopharyngeal swab specimens and should not be used as a sole basis for treatment. Nasal washings and aspirates are unacceptable for Xpert Xpress SARS-CoV-2/FLU/RSV testing.  Fact Sheet for Patients: BloggerCourse.com  Fact Sheet for Healthcare Providers: SeriousBroker.it  This test is not yet approved or cleared by the [place] FDA and has been authorized for detection and/or diagnosis of SARS-CoV-2 by FDA under an Emergency Use Authorization (EUA). This EUA will remain in effect (meaning this test can be used) for the duration of the COVID-19 declaration under Section 564(b)(1) of the Act, 21 U.S.C. section 360bbb-3(b)(1), unless the authorization is terminated or revoked.  Performed at [facility], [address] [place], [place] [address]   Admission on 12/29/2020, Discharged on 12/30/2020  Component Date Value Ref Range Status  . Sodium 12/29/2020 137  135 - 145 mmol/L Final  . Potassium 12/29/2020 4.4  3.5 - 5.1 mmol/L Final  . Chloride 12/29/2020 105  98 - 111 mmol/L Final  . CO2 12/29/2020 26  22 - 32 mmol/L Final  . Glucose, Bld 12/29/2020 99  70 - 99 mg/dL Final   Glucose  reference range applies only to samples taken after fasting for at least 8 hours.  . BUN 12/29/2020 13  6 - 20 mg/dL Final  . Creatinine, Ser 12/29/2020 1.05  0.61 - 1.24 mg/dL Final  . Calcium 16/07/9603 9.3  8.9 - 10.3 mg/dL Final  . Total Protein 12/29/2020 6.4* 6.5 - 8.1 g/dL Final  . Albumin 54/06/8118 4.1  3.5 - 5.0 g/dL Final  . AST 14/78/2956 28  15 - 41 U/L Final  . ALT 12/29/2020 18  0 - 44 U/L Final  . Alkaline Phosphatase 12/29/2020 42  38 - 126 U/L Final  . Total Bilirubin 12/29/2020 1.0  0.3 - 1.2 mg/dL Final  . GFR, Estimated 12/29/2020 >60  >60 mL/min Final   Comment: (NOTE) Calculated using the CKD-EPI Creatinine Equation (2021)   . Anion gap 12/29/2020 6  5 - 15 Final   Performed at [facility], [address] [place], [place] [address]  . Alcohol, Ethyl (B) 12/29/2020 <10  <10 mg/dL Final   Comment: (NOTE) Lowest detectable limit for serum alcohol is 10 mg/dL.  For medical purposes only. Performed at [facility], [address], [place], [place] [address]   . WBC 12/29/2020 5.8  4.0 - 10.5 K/uL Final  . RBC 12/29/2020 4.49  4.22 - 5.81 MIL/uL Final  . Hemoglobin 12/29/2020 13.9  13.0 - 17.0 g/dL Final  . HCT 69/62/9528 40.9  39.0 - 52.0 % Final  . MCV 12/29/2020 91.1  80.0 - 100.0 fL Final  . MCH 12/29/2020 31.0  26.0 - 34.0 pg Final  . MCHC 12/29/2020 34.0  30.0 - 36.0 g/dL Final  . RDW 41/32/4401 12.7  11.5 - 15.5 % Final  . Platelets 12/29/2020 251  150 - 400 K/uL Final  . nRBC 12/29/2020 0.0  0.0 - 0.2 % Final   Performed at [facility], [address], [place], [place] [address]  . Opiates 12/29/2020 NONE DETECTED  NONE DETECTED Final  . Cocaine 12/29/2020 NONE DETECTED  NONE DETECTED Final  . Benzodiazepines 12/29/2020 NONE DETECTED  NONE DETECTED Final  . Amphetamines 12/29/2020 NONE DETECTED  NONE DETECTED Final  . Tetrahydrocannabinol 12/29/2020 NONE DETECTED  NONE DETECTED Final  . Barbiturates 12/29/2020 NONE DETECTED  NONE  DETECTED Final   Comment: (NOTE) DRUG SCREEN FOR MEDICAL PURPOSES ONLY.  IF CONFIRMATION IS NEEDED FOR ANY PURPOSE, NOTIFY LAB WITHIN 5 DAYS.  LOWEST DETECTABLE LIMITS FOR URINE DRUG SCREEN Drug Class                     Cutoff (ng/mL) Amphetamine and metabolites    1000 Barbiturate and metabolites    200 Benzodiazepine                 200 Tricyclics and metabolites     300 Opiates and metabolites        300 Cocaine and metabolites        300 THC                            50 Performed at [facility], [address], [place], [place] [address]   Admission on 12/28/2020, Discharged on 12/28/2020  Component Date Value Ref Range Status  . SARS Coronavirus 2 by RT PCR 12/28/2020 NEGATIVE  NEGATIVE Final   Comment: (NOTE) SARS-CoV-2 target nucleic acids are NOT DETECTED.  The SARS-CoV-2 RNA is generally detectable in upper respiratory specimens during the acute phase of infection. The lowest concentration of SARS-CoV-2 viral copies this assay can detect is  138 copies/mL. A negative result does not preclude SARS-Cov-2 infection and should not be used as the sole basis for treatment or other patient management decisions. A negative result may occur with  improper specimen collection/handling, submission of specimen other than nasopharyngeal swab, presence of viral mutation(s) within the areas targeted by this assay, and inadequate number of viral copies(<138 copies/mL). A negative result must be combined with clinical observations, patient history, and epidemiological information. The expected result is Negative.  Fact Sheet for Patients:  BloggerCourse.com  Fact Sheet for Healthcare Providers:  SeriousBroker.it  This test is no                          t yet approved or cleared by the [place] FDA and  has been authorized for detection and/or diagnosis of SARS-CoV-2 by FDA under an Emergency Use Authorization  (EUA). This EUA will remain  in effect (meaning this test can be used) for the duration of the COVID-19 declaration under Section 564(b)(1) of the Act, 21 U.S.C.section 360bbb-3(b)(1), unless the authorization is terminated  or revoked sooner.      . Influenza A by PCR 12/28/2020 NEGATIVE  NEGATIVE Final  . Influenza B by PCR 12/28/2020 NEGATIVE  NEGATIVE Final   Comment: (NOTE) The Xpert Xpress SARS-CoV-2/FLU/RSV plus assay is intended as an aid in the diagnosis of influenza from Nasopharyngeal swab specimens and should not be used as a sole basis for treatment. Nasal washings and aspirates are unacceptable for Xpert Xpress SARS-CoV-2/FLU/RSV testing.  Fact Sheet for Patients: BloggerCourse.com  Fact Sheet for Healthcare Providers: SeriousBroker.it  This test is not yet approved or cleared by the [place] FDA and has been authorized for detection and/or diagnosis of SARS-CoV-2 by FDA under an Emergency Use Authorization (EUA). This EUA will remain in effect (meaning this test can be used) for the duration of the COVID-19 declaration under Section 564(b)(1) of the Act, 21 U.S.C. section 360bbb-3(b)(1), unless the authorization is terminated or revoked.  Performed at [facility], [address], [place], [place] [address]   . WBC 12/28/2020 7.1  4.0 - 10.5 K/uL Final  . RBC 12/28/2020 4.94  4.22 - 5.81 MIL/uL Final  . Hemoglobin 12/28/2020 15.1  13.0 - 17.0 g/dL Final  . HCT 60/45/4098 44.0  39.0 - 52.0 % Final  . MCV 12/28/2020 89.1  80.0 - 100.0 fL Final  . MCH 12/28/2020 30.6  26.0 - 34.0 pg Final  . MCHC 12/28/2020 34.3  30.0 - 36.0 g/dL Final  . RDW 11/91/4782 12.8  11.5 - 15.5 % Final  . Platelets 12/28/2020 250  150 - 400 K/uL Final  . nRBC 12/28/2020 0.0  0.0 - 0.2 % Final  . Neutrophils Relative % 12/28/2020 38  % Final  . Neutro Abs 12/28/2020 2.7  1.7 - 7.7 K/uL Final  . Lymphocytes Relative  12/28/2020 45  % Final  . Lymphs Abs 12/28/2020 3.2  0.7 - 4.0 K/uL Final  . Monocytes Relative 12/28/2020 7  % Final  . Monocytes Absolute 12/28/2020 0.5  0.1 - 1.0 K/uL Final  . Eosinophils Relative 12/28/2020 9  % Final  . Eosinophils Absolute 12/28/2020 0.6* 0.0 - 0.5 K/uL Final  . Basophils Relative 12/28/2020 1  % Final  . Basophils Absolute 12/28/2020 0.1  0.0 - 0.1 K/uL Final  . Immature Granulocytes 12/28/2020 0  % Final  . Abs Immature Granulocytes 12/28/2020 0.03  0.00 - 0.07 K/uL Final  Performed at [facility], [address], [place], [place] [address]  . Sodium 12/28/2020 138  135 - 145 mmol/L Final  . Potassium 12/28/2020 3.8  3.5 - 5.1 mmol/L Final  . Chloride 12/28/2020 102  98 - 111 mmol/L Final  . CO2 12/28/2020 27  22 - 32 mmol/L Final  . Glucose, Bld 12/28/2020 105* 70 - 99 mg/dL Final   Glucose reference range applies only to samples taken after fasting for at least 8 hours.  . BUN 12/28/2020 17  6 - 20 mg/dL Final  . Creatinine, Ser 12/28/2020 1.03  0.61 - 1.24 mg/dL Final  . Calcium 62/13/0865 9.6  8.9 - 10.3 mg/dL Final  . Total Protein 12/28/2020 6.9  6.5 - 8.1 g/dL Final  . Albumin 78/46/9629 4.4  3.5 - 5.0 g/dL Final  . AST 52/84/1324 26  15 - 41 U/L Final  . ALT 12/28/2020 25  0 - 44 U/L Final  . Alkaline Phosphatase 12/28/2020 46  38 - 126 U/L Final  . Total Bilirubin 12/28/2020 0.7  0.3 - 1.2 mg/dL Final  . GFR, Estimated 12/28/2020 >60  >60 mL/min Final   Comment: (NOTE) Calculated using the CKD-EPI Creatinine Equation (2021)   . Anion gap 12/28/2020 9  5 - 15 Final   Performed at [facility], [address], [place], [place] [address]  . Hgb A1c MFr Bld 12/28/2020 5.5  4.8 - 5.6 % Final   Comment: (NOTE) Pre diabetes:          5.7%-6.4%  Diabetes:              >6.4%  Glycemic control for   <7.0% adults with diabetes   . Mean Plasma Glucose 12/28/2020 111.15  mg/dL Final   Performed at [facility], [address]  [place], [place] [address]  . TSH 12/28/2020 2.629  0.350 - 4.500 uIU/mL Final   Comment: Performed by a 3rd Generation assay with a functional sensitivity of <=0.01 uIU/mL. Performed at [facility], [address], [place], [place] [address]   . POC Amphetamine UR 12/28/2020 None Detected  NONE DETECTED (Cut Off Level 1000 ng/mL) Final  . POC Secobarbital (BAR) 12/28/2020 None Detected  NONE DETECTED (Cut Off Level 300 ng/mL) Final  . POC Buprenorphine (BUP) 12/28/2020 None Detected  NONE DETECTED (Cut Off Level 10 ng/mL) Final  . POC Oxazepam (BZO) 12/28/2020 None Detected  NONE DETECTED (Cut Off Level 300 ng/mL) Final  . POC Cocaine UR 12/28/2020 None Detected  NONE DETECTED (Cut Off Level 300 ng/mL) Final  . POC Methamphetamine UR 12/28/2020 None Detected  NONE DETECTED (Cut Off Level 1000 ng/mL) Final  . POC Morphine 12/28/2020 None Detected  NONE DETECTED (Cut Off Level 300 ng/mL) Final  . POC Oxycodone UR 12/28/2020 None Detected  NONE DETECTED (Cut Off Level 100 ng/mL) Final  . POC Methadone UR 12/28/2020 None Detected  NONE DETECTED (Cut Off Level 300 ng/mL) Final  . POC Marijuana UR 12/28/2020 Positive* NONE DETECTED (Cut Off Level 50 ng/mL) Final  . SARS Coronavirus 2 Ag 12/28/2020 NEGATIVE  NEGATIVE Final   Comment: (NOTE) SARS-CoV-2 antigen NOT DETECTED.   Negative results are presumptive.  Negative results do not preclude SARS-CoV-2 infection and should not be used as the sole basis for treatment or other patient management decisions, including infection  control decisions, particularly in the presence of clinical signs and  symptoms consistent with COVID-19, or in those who have been  in contact with the virus.  Negative results must be combined with clinical observations, patient history, and epidemiological information. The expected result is Negative.  Fact Sheet for Patients: https://www.jennings-kim.com/  Fact Sheet for Healthcare  Providers: https://alexander-rogers.biz/  This test is not yet approved or cleared by the [place] FDA and  has been authorized for detection and/or diagnosis of SARS-CoV-2 by FDA under an Emergency Use Authorization (EUA).  This EUA will remain in effect (meaning this test can be used) for the duration of  the COV                          ID-19 declaration under Section 564(b)(1) of the Act, 21 U.S.C. section 360bbb-3(b)(1), unless the authorization is terminated or revoked sooner.    . Cholesterol 12/28/2020 188  0 - 200 mg/dL Final  . Triglycerides 12/28/2020 69  <150 mg/dL Final  . HDL 16/07/9603 77  >40 mg/dL Final  . Total CHOL/HDL Ratio 12/28/2020 2.4  RATIO Final  . VLDL 12/28/2020 14  0 - 40 mg/dL Final  . LDL Cholesterol 12/28/2020 97  0 - 99 mg/dL Final   Comment:        Total Cholesterol/HDL:CHD Risk Coronary Heart Disease Risk Table                     Men   Women  1/2 Average Risk   3.4   3.3  Average Risk       5.0   4.4  2 X Average Risk   9.6   7.1  3 X Average Risk  23.4   11.0        Use the calculated Patient Ratio above and the CHD Risk Table to determine the patient's CHD Risk.        ATP III CLASSIFICATION (LDL):  <100     mg/dL   Optimal  540-981  mg/dL   Near or Above                    Optimal  130-159  mg/dL   Borderline  191-478  mg/dL   High  >295     mg/dL   Very High Performed at [facility], [address] [place], [place] [address]   Admission on 12/21/2020, Discharged on 12/23/2020  Component Date Value Ref Range Status  . Sodium 12/21/2020 141  135 - 145 mmol/L Final  . Potassium 12/21/2020 4.0  3.5 - 5.1 mmol/L Final  . Chloride 12/21/2020 107  98 - 111 mmol/L Final  . CO2 12/21/2020 26  22 - 32 mmol/L Final  . Glucose, Bld 12/21/2020 74  70 - 99 mg/dL Final   Glucose reference range applies only to samples taken after fasting for at least 8 hours.  . BUN 12/21/2020 13  6 - 20 mg/dL Final  . Creatinine, Ser  12/21/2020 1.07  0.61 - 1.24 mg/dL Final  . Calcium 86/57/8469 9.3  8.9 - 10.3 mg/dL Final  . Total Protein 12/21/2020 7.1  6.5 - 8.1 g/dL Final  . Albumin 62/95/2841 4.6  3.5 - 5.0 g/dL Final  . AST 32/44/0102 19  15 - 41 U/L Final  . ALT 12/21/2020 11  0 - 44 U/L Final  . Alkaline Phosphatase 12/21/2020 40  38 - 126 U/L Final  . Total Bilirubin 12/21/2020 0.8  0.3 - 1.2 mg/dL Final  . GFR, Estimated 12/21/2020 >60  >60 mL/min Final  Comment: (NOTE) Calculated using the CKD-EPI Creatinine Equation (2021)   . Anion gap 12/21/2020 8  5 - 15 Final   Performed at [facility], [address] [place], [place] [address]  . Alcohol, Ethyl (B) 12/21/2020 <10  <10 mg/dL Final   Comment: (NOTE) Lowest detectable limit for serum alcohol is 10 mg/dL.  For medical purposes only. Performed at [facility], [address] [place], [place] [address]   . Salicylate Lvl 12/21/2020 <7.0* 7.0 - 30.0 mg/dL Final   Performed at [facility], [address] [place], [place] [address]  . Acetaminophen (Tylenol), Serum 12/21/2020 <10* 10 - 30 ug/mL Final   Comment: (NOTE) Therapeutic concentrations vary significantly. A range of 10-30 ug/mL  may be an effective concentration for many patients. However, some  are best treated at concentrations outside of this range. Acetaminophen concentrations >150 ug/mL at 4 hours after ingestion  and >50 ug/mL at 12 hours after ingestion are often associated with  toxic reactions.  Performed at [facility], [address], [place], [place] [address]   . WBC 12/21/2020 5.7  4.0 - 10.5 K/uL Final  . RBC 12/21/2020 4.71  4.22 - 5.81 MIL/uL Final  . Hemoglobin 12/21/2020 14.6  13.0 - 17.0 g/dL Final  . HCT 29/56/2130 42.4  39.0 - 52.0 % Final  . MCV 12/21/2020 90.0  80.0 - 100.0 fL Final  . MCH 12/21/2020 31.0  26.0 - 34.0 pg Final  . MCHC 12/21/2020 34.4  30.0 - 36.0 g/dL Final  . RDW  86/57/8469 12.4  11.5 - 15.5 % Final  . Platelets 12/21/2020 226  150 - 400 K/uL Final  . nRBC 12/21/2020 0.0  0.0 - 0.2 % Final   Performed at [facility], [address], [place], [place] [address]  . Opiates 12/21/2020 NONE DETECTED  NONE DETECTED Final  . Cocaine 12/21/2020 NONE DETECTED  NONE DETECTED Final  . Benzodiazepines 12/21/2020 NONE DETECTED  NONE DETECTED Final  . Amphetamines 12/21/2020 NONE DETECTED  NONE DETECTED Final  . Tetrahydrocannabinol 12/21/2020 NONE DETECTED  NONE DETECTED Final  . Barbiturates 12/21/2020 NONE DETECTED  NONE DETECTED Final   Comment: (NOTE) DRUG SCREEN FOR MEDICAL PURPOSES ONLY.  IF CONFIRMATION IS NEEDED FOR ANY PURPOSE, NOTIFY LAB WITHIN 5 DAYS.  LOWEST DETECTABLE LIMITS FOR URINE DRUG SCREEN Drug Class                     Cutoff (ng/mL) Amphetamine and metabolites    1000 Barbiturate and metabolites    200 Benzodiazepine                 200 Tricyclics and metabolites     300 Opiates and metabolites        300 Cocaine and metabolites        300 THC                            50 Performed at [facility], [address], [place], [place] [address]   . SARS Coronavirus 2 by RT PCR 12/21/2020 NEGATIVE  NEGATIVE Final   Comment: (NOTE) SARS-CoV-2 target nucleic acids are NOT DETECTED.  The SARS-CoV-2 RNA is generally detectable in upper respiratory specimens during the acute phase of infection. The lowest concentration of SARS-CoV-2 viral copies this assay can detect is 138 copies/mL. A negative result does not preclude SARS-Cov-2 infection  and should not be used as the sole basis for treatment or other patient management decisions. A negative result may occur with  improper specimen collection/handling, submission of specimen other than nasopharyngeal swab, presence of viral mutation(s) within the areas targeted by this assay, and inadequate number of viral copies(<138 copies/mL). A negative  result must be combined with clinical observations, patient history, and epidemiological information. The expected result is Negative.  Fact Sheet for Patients:  BloggerCourse.com  Fact Sheet for Healthcare Providers:  SeriousBroker.it  This test is no                          t yet approved or cleared by the [place] FDA and  has been authorized for detection and/or diagnosis of SARS-CoV-2 by FDA under an Emergency Use Authorization (EUA). This EUA will remain  in effect (meaning this test can be used) for the duration of the COVID-19 declaration under Section 564(b)(1) of the Act, 21 U.S.C.section 360bbb-3(b)(1), unless the authorization is terminated  or revoked sooner.      . Influenza A by PCR 12/21/2020 NEGATIVE  NEGATIVE Final  . Influenza B by PCR 12/21/2020 NEGATIVE  NEGATIVE Final   Comment: (NOTE) The Xpert Xpress SARS-CoV-2/FLU/RSV plus assay is intended as an aid in the diagnosis of influenza from Nasopharyngeal swab specimens and should not be used as a sole basis for treatment. Nasal washings and aspirates are unacceptable for Xpert Xpress SARS-CoV-2/FLU/RSV testing.  Fact Sheet for Patients: BloggerCourse.com  Fact Sheet for Healthcare Providers: SeriousBroker.it  This test is not yet approved or cleared by the [place] FDA and has been authorized for detection and/or diagnosis of SARS-CoV-2 by FDA under an Emergency Use Authorization (EUA). This EUA will remain in effect (meaning this test can be used) for the duration of the COVID-19 declaration under Section 564(b)(1) of the Act, 21 U.S.C. section 360bbb-3(b)(1), unless the authorization is terminated or revoked.  Performed at [facility], [address] [place], [place] [address]   Admission on 10/22/2020, Discharged on 10/24/2020  Component Date Value Ref Range Status  .  Sodium 10/22/2020 140  135 - 145 mmol/L Final  . Potassium 10/22/2020 3.9  3.5 - 5.1 mmol/L Final  . Chloride 10/22/2020 103  98 - 111 mmol/L Final  . CO2 10/22/2020 25  22 - 32 mmol/L Final  . Glucose, Bld 10/22/2020 93  70 - 99 mg/dL Final   Glucose reference range applies only to samples taken after fasting for at least 8 hours.  . BUN 10/22/2020 12  6 - 20 mg/dL Final  . Creatinine, Ser 10/22/2020 1.17  0.61 - 1.24 mg/dL Final  . Calcium 60/45/4098 9.6  8.9 - 10.3 mg/dL Final  . Total Protein 10/22/2020 7.1  6.5 - 8.1 g/dL Final  . Albumin 11/91/4782 4.5  3.5 - 5.0 g/dL Final  . AST 95/62/1308 21  15 - 41 U/L Final  . ALT 10/22/2020 14  0 - 44 U/L Final  . Alkaline Phosphatase 10/22/2020 42  38 - 126 U/L Final  . Total Bilirubin 10/22/2020 0.8  0.3 - 1.2 mg/dL Final  . GFR, Estimated 10/22/2020 >60  >60 mL/min Final   Comment: (NOTE) Calculated using the CKD-EPI Creatinine Equation (2021)   . Anion gap 10/22/2020 12  5 - 15 Final   Performed at [facility], [address], [place], [place] [address]  . Alcohol, Ethyl (B) 10/22/2020 <10  <10 mg/dL  Final   Comment: (NOTE) Lowest detectable limit for serum alcohol is 10 mg/dL.  For medical purposes only. Performed at [facility], [address] [place], [place] [address]   . Salicylate Lvl 10/22/2020 <7.0* 7.0 - 30.0 mg/dL Final   Performed at [facility], [address] [place], [place] [address]  . Acetaminophen (Tylenol), Serum 10/22/2020 <10* 10 - 30 ug/mL Final   Comment: (NOTE) Therapeutic concentrations vary significantly. A range of 10-30 ug/mL  may be an effective concentration for many patients. However, some  are best treated at concentrations outside of this range. Acetaminophen concentrations >150 ug/mL at 4 hours after ingestion  and >50 ug/mL at 12 hours after ingestion are often associated with  toxic reactions.  Performed at [facility], [address], [place],  [place] [address]   . WBC 10/22/2020 6.7  4.0 - 10.5 K/uL Final  . RBC 10/22/2020 5.03  4.22 - 5.81 MIL/uL Final  . Hemoglobin 10/22/2020 15.4  13.0 - 17.0 g/dL Final  . HCT 91/47/8295 46.2  39.0 - 52.0 % Final  . MCV 10/22/2020 91.8  80.0 - 100.0 fL Final  . MCH 10/22/2020 30.6  26.0 - 34.0 pg Final  . MCHC 10/22/2020 33.3  30.0 - 36.0 g/dL Final  . RDW 62/13/0865 12.5  11.5 - 15.5 % Final  . Platelets 10/22/2020 282  150 - 400 K/uL Final  . nRBC 10/22/2020 0.0  0.0 - 0.2 % Final   Performed at [facility], [address], [place], [place] [address]  . Opiates 10/22/2020 NONE DETECTED  NONE DETECTED Final  . Cocaine 10/22/2020 NONE DETECTED  NONE DETECTED Final  . Benzodiazepines 10/22/2020 NONE DETECTED  NONE DETECTED Final  . Amphetamines 10/22/2020 NONE DETECTED  NONE DETECTED Final  . Tetrahydrocannabinol 10/22/2020 NONE DETECTED  NONE DETECTED Final  . Barbiturates 10/22/2020 NONE DETECTED  NONE DETECTED Final   Comment: (NOTE) DRUG SCREEN FOR MEDICAL PURPOSES ONLY.  IF CONFIRMATION IS NEEDED FOR ANY PURPOSE, NOTIFY LAB WITHIN 5 DAYS.  LOWEST DETECTABLE LIMITS FOR URINE DRUG SCREEN Drug Class                     Cutoff (ng/mL) Amphetamine and metabolites    1000 Barbiturate and metabolites    200 Benzodiazepine                 200 Tricyclics and metabolites     300 Opiates and metabolites        300 Cocaine and metabolites        300 THC                            50 Performed at [facility], [address] [place], [place] [address]   . SARS Coronavirus 2 by RT PCR 10/23/2020 POSITIVE* NEGATIVE Final   Comment: SHULER RN, AT 1108 10/23/20 D. VANHOOK (NOTE) SARS-CoV-2 target nucleic acids are DETECTED.  The SARS-CoV-2 RNA is generally detectable in upper respiratory specimens during the acute phase of infection. Positive results are indicative of the presence of the identified virus, but do not rule out bacterial infection or co-infection with other pathogens  not detected by the test. Clinical correlation with patient history and other diagnostic information is necessary to determine patient infection status. The expected result is Negative.  Fact Sheet for Patients: BloggerCourse.com  Fact Sheet for Healthcare Providers: SeriousBroker.it  This test is not yet approved or cleared by the [place] and  has been authorized for detection and/or diagnosis of SARS-CoV-2 by FDA under an Emergency Use Authorization (EUA).  This EUA will remain in effect (meaning this test can be used) for the duration of  the COVID-19 decla                          ration under Section 564(b)(1) of the Act, 21 U.S.C. section 360bbb-3(b)(1), unless the authorization is terminated or revoked sooner.    . Influenza A by PCR 10/23/2020 NEGATIVE  NEGATIVE Final  . Influenza B by PCR 10/23/2020 NEGATIVE  NEGATIVE Final   Comment: (NOTE) The Xpert Xpress SARS-CoV-2/FLU/RSV plus assay is intended as an aid in the diagnosis of influenza from Nasopharyngeal swab specimens and should not be used as a sole basis for treatment. Nasal washings and aspirates are unacceptable for Xpert Xpress SARS-CoV-2/FLU/RSV testing.  Fact Sheet for Patients: BloggerCourse.com  Fact Sheet for Healthcare Providers: SeriousBroker.it  This test is not yet approved or cleared by the [place] FDA and has been authorized for detection and/or diagnosis of SARS-CoV-2 by FDA under an Emergency Use Authorization (EUA). This EUA will remain in effect (meaning this test can be used) for the duration of the COVID-19 declaration under Section 564(b)(1) of the Act, 21 U.S.C. section 360bbb-3(b)(1), unless the authorization is terminated or revoked.  Performed at [facility], [address], [place], [place] [address]   Admission on 10/06/2020, Discharged on 10/07/2020   Component Date Value Ref Range Status  . SARS Coronavirus 2 by RT PCR 10/06/2020 POSITIVE* NEGATIVE Final   Comment: RESULT CALLED TO, READ BACK BY AND VERIFIED WITH: LOWDERMILK J. 12.29.21 @ 2123 BY MECIAL J.  (NOTE) SARS-CoV-2 target nucleic acids are DETECTED.  The SARS-CoV-2 RNA is generally detectable in upper respiratory specimens during the acute phase of infection. Positive results are indicative of the presence of the identified virus, but do not rule out bacterial infection or co-infection with other pathogens not detected by the test. Clinical correlation with patient history and other diagnostic information is necessary to determine patient infection status. The expected result is Negative.  Fact Sheet for Patients: BloggerCourse.com  Fact Sheet for Healthcare Providers: SeriousBroker.it  This test is not yet approved or cleared by the [place] FDA and  has been authorized for detection and/or diagnosis of SARS-CoV-2 by FDA under an Emergency Use Authorization (EUA).  This EUA will remain in effect (meaning this t                          est can be used) for the duration of  the COVID-19 declaration under Section 564(b)(1) of the Act, 21 U.S.C. section 360bbb-3(b)(1), unless the authorization is terminated or revoked sooner.    . Influenza A by PCR 10/06/2020 NEGATIVE  NEGATIVE Final  . Influenza B by PCR 10/06/2020 NEGATIVE  NEGATIVE Final   Comment: (NOTE) The Xpert Xpress SARS-CoV-2/FLU/RSV plus assay is intended as an aid in the diagnosis of influenza from Nasopharyngeal swab specimens and should not be used as a sole basis for treatment. Nasal washings and aspirates are unacceptable for Xpert Xpress SARS-CoV-2/FLU/RSV testing.  Fact Sheet for Patients: BloggerCourse.com  Fact Sheet for Healthcare Providers: SeriousBroker.it  This test is not yet  approved or cleared by the [place] FDA and has been authorized for detection and/or  diagnosis of SARS-CoV-2 by FDA under an Emergency Use Authorization (EUA). This EUA will remain in effect (meaning this test can be used) for the duration of the COVID-19 declaration under Section 564(b)(1) of the Act, 21 U.S.C. section 360bbb-3(b)(1), unless the authorization is terminated or revoked.  Performed at [facility], [address] [place], [place] [address]   . Sodium 10/06/2020 139  135 - 145 mmol/L Final  . Potassium 10/06/2020 4.0  3.5 - 5.1 mmol/L Final  . Chloride 10/06/2020 102  98 - 111 mmol/L Final  . CO2 10/06/2020 26  22 - 32 mmol/L Final  . Glucose, Bld 10/06/2020 81  70 - 99 mg/dL Final   Glucose reference range applies only to samples taken after fasting for at least 8 hours.  . BUN 10/06/2020 13  6 - 20 mg/dL Final  . Creatinine, Ser 10/06/2020 1.01  0.61 - 1.24 mg/dL Final  . Calcium 60/45/4098 9.6  8.9 - 10.3 mg/dL Final  . Total Protein 10/06/2020 7.6  6.5 - 8.1 g/dL Final  . Albumin 11/91/4782 4.7  3.5 - 5.0 g/dL Final  . AST 95/62/1308 21  15 - 41 U/L Final  . ALT 10/06/2020 15  0 - 44 U/L Final  . Alkaline Phosphatase 10/06/2020 39  38 - 126 U/L Final  . Total Bilirubin 10/06/2020 0.7  0.3 - 1.2 mg/dL Final  . GFR, Estimated 10/06/2020 >60  >60 mL/min Final   Comment: (NOTE) Calculated using the CKD-EPI Creatinine Equation (2021)   . Anion gap 10/06/2020 11  5 - 15 Final   Performed at [facility], [address] [place], [place] [address]  . Alcohol, Ethyl (B) 10/06/2020 <10  <10 mg/dL Final   Comment: (NOTE) Lowest detectable limit for serum alcohol is 10 mg/dL.  For medical purposes only. Performed at [facility], [address], [place], [place] [address]   . Opiates 10/07/2020 NONE DETECTED  NONE DETECTED Final  . Cocaine 10/07/2020 NONE DETECTED  NONE DETECTED Final  . Benzodiazepines  10/07/2020 NONE DETECTED  NONE DETECTED Final  . Amphetamines 10/07/2020 NONE DETECTED  NONE DETECTED Final  . Tetrahydrocannabinol 10/07/2020 NONE DETECTED  NONE DETECTED Final  . Barbiturates 10/07/2020 NONE DETECTED  NONE DETECTED Final   Comment: (NOTE) DRUG SCREEN FOR MEDICAL PURPOSES ONLY.  IF CONFIRMATION IS NEEDED FOR ANY PURPOSE, NOTIFY LAB WITHIN 5 DAYS.  LOWEST DETECTABLE LIMITS FOR URINE DRUG SCREEN Drug Class                     Cutoff (ng/mL) Amphetamine and metabolites    1000 Barbiturate and metabolites    200 Benzodiazepine                 200 Tricyclics and metabolites     300 Opiates and metabolites        300 Cocaine and metabolites        300 THC                            50 Performed at [facility], [address] [place], [place] [address]   . WBC 10/06/2020 8.0  4.0 - 10.5 K/uL Final  . RBC 10/06/2020 5.36  4.22 - 5.81 MIL/uL Final  . Hemoglobin 10/06/2020 16.6  13.0 - 17.0 g/dL Final  . HCT 32/44/0102 49.3  39.0 - 52.0 % Final  . MCV 10/06/2020 92.0  80.0 - 100.0 fL Final  . MCH 10/06/2020 31.0  26.0 - 34.0 pg Final  . MCHC 10/06/2020 33.7  30.0 - 36.0 g/dL Final  . RDW 65/12/5463 12.7  11.5 - 15.5 % Final  . Platelets 10/06/2020 256  150 - 400 K/uL Final  . nRBC 10/06/2020 0.0  0.0 - 0.2 % Final  . Neutrophils Relative % 10/06/2020 57  % Final  . Neutro Abs 10/06/2020 4.6  1.7 - 7.7 K/uL Final  . Lymphocytes Relative 10/06/2020 30  % Final  . Lymphs Abs 10/06/2020 2.4  0.7 - 4.0 K/uL Final  . Monocytes Relative 10/06/2020 8  % Final  . Monocytes Absolute 10/06/2020 0.7  0.1 - 1.0 K/uL Final  . Eosinophils Relative 10/06/2020 4  % Final  . Eosinophils Absolute 10/06/2020 0.4  0.0 - 0.5 K/uL Final  . Basophils Relative 10/06/2020 1  % Final  . Basophils Absolute 10/06/2020 0.1  0.0 - 0.1 K/uL Final  . Immature Granulocytes 10/06/2020 0  % Final  . Abs Immature Granulocytes 10/06/2020 0.01  0.00 - 0.07 K/uL Final   Performed at  [facility], [address] [place], [place] [address]  Admission on 08/06/2020, Discharged on 08/06/2020  Component Date Value Ref Range Status  . Sodium 08/05/2020 140  135 - 145 mmol/L Final  . Potassium 08/05/2020 4.3  3.5 - 5.1 mmol/L Final  . Chloride 08/05/2020 103  98 - 111 mmol/L Final  . CO2 08/05/2020 28  22 - 32 mmol/L Final  . Glucose, Bld 08/05/2020 113* 70 - 99 mg/dL Final   Glucose reference range applies only to samples taken after fasting for at least 8 hours.  . BUN 08/05/2020 14  6 - 20 mg/dL Final  . Creatinine, Ser 08/05/2020 1.24  0.61 - 1.24 mg/dL Final  . Calcium 51/70/0174 9.4  8.9 - 10.3 mg/dL Final  . Total Protein 08/05/2020 7.3  6.5 - 8.1 g/dL Final  . Albumin 94/49/6759 4.4  3.5 - 5.0 g/dL Final  . AST 16/38/4665 17  15 - 41 U/L Final  . ALT 08/05/2020 14  0 - 44 U/L Final  . Alkaline Phosphatase 08/05/2020 41  38 - 126 U/L Final  . Total Bilirubin 08/05/2020 0.7  0.3 - 1.2 mg/dL Final  . GFR, Estimated 08/05/2020 >60  >60 mL/min Final   Comment: (NOTE) Calculated using the CKD-EPI Creatinine Equation (2021)   . Anion gap 08/05/2020 9  5 - 15 Final   Performed at [facility], [address], [place], [place] [address]  . Alcohol, Ethyl (B) 08/05/2020 <10  <10 mg/dL Final   Comment: (NOTE) Lowest detectable limit for serum alcohol is 10 mg/dL.  For medical purposes only. Performed at [facility], [address], [place], [place] [address]   . Salicylate Lvl 08/05/2020 <7.0* 7.0 - 30.0 mg/dL Final   Performed at [facility], [address], [place], [place] [address]  . Acetaminophen (Tylenol), Serum 08/05/2020 <10* 10 - 30 ug/mL Final   Comment: (NOTE) Therapeutic concentrations vary significantly. A range of 10-30 ug/mL  may be an effective concentration for many patients. However, some  are best treated at concentrations outside of this range. Acetaminophen  concentrations >150 ug/mL at 4 hours after ingestion  and >50 ug/mL at 12 hours after ingestion are often associated with  toxic reactions.  Performed at [facility], [address], [place], [place] [address]   . WBC  08/05/2020 5.3  4.0 - 10.5 K/uL Final  . RBC 08/05/2020 4.52  4.22 - 5.81 MIL/uL Final  . Hemoglobin 08/05/2020 14.3  13.0 - 17.0 g/dL Final  . HCT 60/63/0160 41.9  39.0 - 52.0 % Final  . MCV 08/05/2020 92.7  80.0 - 100.0 fL Final  . MCH 08/05/2020 31.6  26.0 - 34.0 pg Final  . MCHC 08/05/2020 34.1  30.0 - 36.0 g/dL Final  . RDW 10/93/2355 13.0  11.5 - 15.5 % Final  . Platelets 08/05/2020 251  150 - 400 K/uL Final  . nRBC 08/05/2020 0.0  0.0 - 0.2 % Final   Performed at [facility], [address], [place], [place] [address]  . Opiates 08/06/2020 NONE DETECTED  NONE DETECTED Final  . Cocaine 08/06/2020 NONE DETECTED  NONE DETECTED Final  . Benzodiazepines 08/06/2020 NONE DETECTED  NONE DETECTED Final  . Amphetamines 08/06/2020 NONE DETECTED  NONE DETECTED Final  . Tetrahydrocannabinol 08/06/2020 POSITIVE* NONE DETECTED Final  . Barbiturates 08/06/2020 NONE DETECTED  NONE DETECTED Final   Comment: (NOTE) DRUG SCREEN FOR MEDICAL PURPOSES ONLY.  IF CONFIRMATION IS NEEDED FOR ANY PURPOSE, NOTIFY LAB WITHIN 5 DAYS.  LOWEST DETECTABLE LIMITS FOR URINE DRUG SCREEN Drug Class                     Cutoff (ng/mL) Amphetamine and metabolites    1000 Barbiturate and metabolites    200 Benzodiazepine                 200 Tricyclics and metabolites     300 Opiates and metabolites        300 Cocaine and metabolites        300 THC                            50 Performed at [facility], [address], [place], [place] [address]   . SARS Coronavirus 2 by RT PCR 08/06/2020 NEGATIVE  NEGATIVE Final   Comment: (NOTE) SARS-CoV-2 target nucleic acids are NOT DETECTED.  The SARS-CoV-2 RNA is generally detectable in upper  respiratoy specimens during the acute phase of infection. The lowest concentration of SARS-CoV-2 viral copies this assay can detect is 131 copies/mL. A negative result does not preclude SARS-Cov-2 infection and should not be used as the sole basis for treatment or other patient management decisions. A negative result may occur with  improper specimen collection/handling, submission of specimen other than nasopharyngeal swab, presence of viral mutation(s) within the areas targeted by this assay, and inadequate number of viral copies (<131 copies/mL). A negative result must be combined with clinical observations, patient history, and epidemiological information. The expected result is Negative.  Fact Sheet for Patients:  https://www.moore.com/  Fact Sheet for Healthcare Providers:  https://www.young.biz/  This test is no                          t yet approved or cleared by the [place] FDA and  has been authorized for detection and/or diagnosis of SARS-CoV-2 by FDA under an Emergency Use Authorization (EUA). This EUA will remain  in effect (meaning this test can be used) for the duration of the COVID-19 declaration under Section 564(b)(1) of the Act, 21 U.S.C. section 360bbb-3(b)(1), unless the authorization is terminated or revoked sooner.    . Influenza A by PCR 08/06/2020 NEGATIVE  NEGATIVE Final  .  Influenza B by PCR 08/06/2020 NEGATIVE  NEGATIVE Final   Comment: (NOTE) The Xpert Xpress SARS-CoV-2/FLU/RSV assay is intended as an aid in  the diagnosis of influenza from Nasopharyngeal swab specimens and  should not be used as a sole basis for treatment. Nasal washings and  aspirates are unacceptable for Xpert Xpress SARS-CoV-2/FLU/RSV  testing.  Fact Sheet for Patients: https://www.moore.com/  Fact Sheet for Healthcare Providers: https://www.young.biz/  This test is not yet approved or  cleared by the [place] FDA and  has been authorized for detection and/or diagnosis of SARS-CoV-2 by  FDA under an Emergency Use Authorization (EUA). This EUA will remain  in effect (meaning this test can be used) for the duration of the  Covid-19 declaration under Section 564(b)(1) of the Act, 21  U.S.C. section 360bbb-3(b)(1), unless the authorization is  terminated or revoked. Performed at [facility], [address], [place], [place] [address]   Admission on 07/25/2020, Discharged on 07/26/2020  Component Date Value Ref Range Status  . Sodium 07/25/2020 137  135 - 145 mmol/L Final  . Potassium 07/25/2020 4.0  3.5 - 5.1 mmol/L Final  . Chloride 07/25/2020 100  98 - 111 mmol/L Final  . CO2 07/25/2020 28  22 - 32 mmol/L Final  . Glucose, Bld 07/25/2020 90  70 - 99 mg/dL Final   Glucose reference range applies only to samples taken after fasting for at least 8 hours.  . BUN 07/25/2020 15  6 - 20 mg/dL Final  . Creatinine, Ser 07/25/2020 1.06  0.61 - 1.24 mg/dL Final  . Calcium 19/14/7829 9.4  8.9 - 10.3 mg/dL Final  . Total Protein 07/25/2020 7.7  6.5 - 8.1 g/dL Final  . Albumin 56/21/3086 5.0  3.5 - 5.0 g/dL Final  . AST 57/84/6962 21  15 - 41 U/L Final  . ALT 07/25/2020 16  0 - 44 U/L Final  . Alkaline Phosphatase 07/25/2020 38  38 - 126 U/L Final  . Total Bilirubin 07/25/2020 1.1  0.3 - 1.2 mg/dL Final  . GFR, Estimated 07/25/2020 >60  >60 mL/min Final  . Anion gap 07/25/2020 9  5 - 15 Final   Performed at [facility], [address], [place], [place] [address]  . Alcohol, Ethyl (B) 07/25/2020 <10  <10 mg/dL Final   Comment: (NOTE) Lowest detectable limit for serum alcohol is 10 mg/dL.  For medical purposes only. Performed at [facility], [address], [place], [place] [address]   . WBC 07/25/2020 7.1  4.0 - 10.5 K/uL Final  . RBC 07/25/2020 4.81  4.22 - 5.81 MIL/uL Final  . Hemoglobin 07/25/2020 15.0  13.0  - 17.0 g/dL Final  . HCT 10/11/7251 43.8  39.0 - 52.0 % Final  . MCV 07/25/2020 91.1  80.0 - 100.0 fL Final  . MCH 07/25/2020 31.2  26.0 - 34.0 pg Final  . MCHC 07/25/2020 34.2  30.0 - 36.0 g/dL Final  . RDW 66/44/0347 13.1  11.5 - 15.5 % Final  . Platelets 07/25/2020 216  150 - 400 K/uL Final  . nRBC 07/25/2020 0.0  0.0 - 0.2 % Final   Performed at [facility], [address] [place], [place] [address]  . Opiates 07/25/2020 NONE DETECTED  NONE DETECTED Final  . Cocaine 07/25/2020 NONE DETECTED  NONE DETECTED Final  . Benzodiazepines 07/25/2020 NONE DETECTED  NONE DETECTED Final  . Amphetamines 07/25/2020 NONE DETECTED  NONE DETECTED Final  . Tetrahydrocannabinol 07/25/2020 POSITIVE*  NONE DETECTED Final  . Barbiturates 07/25/2020 NONE DETECTED  NONE DETECTED Final   Comment: (NOTE) DRUG SCREEN FOR MEDICAL PURPOSES ONLY.  IF CONFIRMATION IS NEEDED FOR ANY PURPOSE, NOTIFY LAB WITHIN 5 DAYS.  LOWEST DETECTABLE LIMITS FOR URINE DRUG SCREEN Drug Class                     Cutoff (ng/mL) Amphetamine and metabolites    1000 Barbiturate and metabolites    200 Benzodiazepine                 200 Tricyclics and metabolites     300 Opiates and metabolites        300 Cocaine and metabolites        300 THC                            50 Performed at [facility], [address], [place], [place] [address]   . SARS Coronavirus 2 by RT PCR 07/25/2020 NEGATIVE  NEGATIVE Final   Comment: (NOTE) SARS-CoV-2 target nucleic acids are NOT DETECTED.  The SARS-CoV-2 RNA is generally detectable in upper respiratoy specimens during the acute phase of infection. The lowest concentration of SARS-CoV-2 viral copies this assay can detect is 131 copies/mL. A negative result does not preclude SARS-Cov-2 infection and should not be used as the sole basis for treatment or other patient management decisions. A negative result may occur with  improper specimen  collection/handling, submission of specimen other than nasopharyngeal swab, presence of viral mutation(s) within the areas targeted by this assay, and inadequate number of viral copies (<131 copies/mL). A negative result must be combined with clinical observations, patient history, and epidemiological information. The expected result is Negative.  Fact Sheet for Patients:  https://www.moore.com/  Fact Sheet for Healthcare Providers:  https://www.young.biz/  This test is no                          t yet approved or cleared by the [place] FDA and  has been authorized for detection and/or diagnosis of SARS-CoV-2 by FDA under an Emergency Use Authorization (EUA). This EUA will remain  in effect (meaning this test can be used) for the duration of the COVID-19 declaration under Section 564(b)(1) of the Act, 21 U.S.C. section 360bbb-3(b)(1), unless the authorization is terminated or revoked sooner.    . Influenza A by PCR 07/25/2020 NEGATIVE  NEGATIVE Final  . Influenza B by PCR 07/25/2020 NEGATIVE  NEGATIVE Final   Comment: (NOTE) The Xpert Xpress SARS-CoV-2/FLU/RSV assay is intended as an aid in  the diagnosis of influenza from Nasopharyngeal swab specimens and  should not be used as a sole basis for treatment. Nasal washings and  aspirates are unacceptable for Xpert Xpress SARS-CoV-2/FLU/RSV  testing.  Fact Sheet for Patients: https://www.moore.com/  Fact Sheet for Healthcare Providers: https://www.young.biz/  This test is not yet approved or cleared by the [place] FDA and  has been authorized for detection and/or diagnosis of SARS-CoV-2 by  FDA under an Emergency Use Authorization (EUA). This EUA will remain  in effect (meaning this test can be used) for the duration of the  Covid-19 declaration under Section 564(b)(1) of the Act, 21  U.S.C. section 360bbb-3(b)(1), unless the authorization  is  terminated or revoked. Performed at [facility], [address], [place], [place] [address]     Allergies: Shellfish  allergy and Bee pollen  PTA Medications: (Not in a hospital admission)   Medical Decision Making  Patient is a 30 year old male with a history of paranoid schizophrenia who presents to the behavioral health urgent care as a direct admit voluntary transfer from [facility] (see HPI for further details).  Patient's schizophrenia appears to be poorly managed and negatively impacting patient's activities of daily living at this time and patient also appears to be quite paranoid at this time.  Thus, recommend continuous observation/assessment for the patient at this time.    Recommendations  Based on my evaluation the patient does not appear to have an emergency medical condition.  Patient will be placed in [facility] continuous observation/assessment for further stabilization and treatment.  Patient will be reevaluated by the treatment team on 01/10/21 and disposition to be determined at that time. Labs ordered and reviewed:  -UDS: Patient did not provide a urine specimen.  Recommend that nursing attempt to obtain a urine specimen for UDS on the patient during the day on January 09, 2021.  -CBC with differential: Unremarkable  -CMP: Unremarkable -Hemoglobin A1c, TSH, and lipid panel conducted on 12/28/20 and were within normal limits.  Thus, these labs will not be repeated at this time. -Patient had an EKG conducted recently on December 23, 2020, which showed no acute/concerning findings with QTC 443 ms.  Thus, EKG does not need to be repeated at this time. Patient adamant that he does not want to receive Invega injection monthly and stated that he wanted to take "a chill pill to help my mind".  Discussed initiating the Invega 6 mg 24-hour tablet p.o. daily that patient was recently prescribed.  Patient agreed to start Invega 6 mg. Will initiate Invega 24-hour tablet 60 mg p.o.  daily for schizophrenia/psychosis  Jaclyn Shaggy, PA-C 01/09/21  12:26 AM

## 2021-01-09 NOTE — BH Assessment (Signed)
Received call from [facility] with [facility] ACTT. She says she suspects Pt's payee is not giving Pt his money and might not be giving Pt his medications. She recommends Pt's payee be contacted and told she needs to pick up Pt when he is ready for discharge. [name] say she believes his payee is benefiting financially from Pt's mental illness.   Pamalee Leyden, [facility], [facility] Triage Specialist [phone]

## 2021-01-11 ENCOUNTER — Ambulatory Visit (HOSPITAL_COMMUNITY)
Admission: EM | Admit: 2021-01-11 | Discharge: 2021-01-11 | Disposition: A | Discharge status: Home / Self Care | Payer: Medicaid Other | Attending: Psychiatry | Admitting: Psychiatry

## 2021-01-11 DIAGNOSIS — Z9114 Patient's other noncompliance with medication regimen: Secondary | ICD-10-CM | POA: Insufficient documentation

## 2021-01-11 DIAGNOSIS — F200 Paranoid schizophrenia: Secondary | ICD-10-CM | POA: Insufficient documentation

## 2021-01-11 MED ORDER — PALIPERIDONE ER 3 MG PO TB24
ORAL_TABLET | ORAL | Status: AC
  Filled 2021-01-11: qty 1

## 2021-01-11 MED ORDER — PALIPERIDONE ER 3 MG PO TB24
9.0000 mg | ORAL_TABLET | Freq: Once | ORAL | Status: AC
  Administered 2021-01-11: 9 mg via ORAL

## 2021-01-11 MED ORDER — PALIPERIDONE ER 6 MG PO TB24
ORAL_TABLET | ORAL | Status: AC
  Filled 2021-01-11: qty 1

## 2021-01-11 NOTE — BH Assessment (Addendum)
[name] is a 30 year old male history of schizophrenia presenting voluntarily at [facility] due to paranoia. Patient stated "I am afraid for my life". Patient HI and psychosis. When asked, are you going to hurt yourself, patient stated "I don't know". However patient presents as paranoia and continues to stare around the room. Patient multiple ED visits, last one on 01/09/21. When questioned if he feels people/someone after him or isgoing to hurt him, he states "I don't know." He reports that he is not takinganymedication. Heappears to be thoughtblocking as he answers "I don't know"to several assessment questions.Patient reported no stressors/triggers. Patient is suspicious and not forthcoming with information. Patient continued to state that he was scared for his life. Patient shared no collateral contact information. Emergent

## 2021-01-11 NOTE — Discharge Summary (Signed)
[name] to be D/C'd home per NP order. Discussed with the patient and all questions fully answered. An After Visit Summary was printed and given to the patient. Patient escorted out and D/C home via safe transport. [name]  01/11/2021 8:02 AM

## 2021-01-11 NOTE — ED Provider Notes (Signed)
Behavioral Health Urgent Care Medical Screening Exam  Patient Name: [name] MRN: [id number] Date of Evaluation: 01/11/21 Chief Complaint:   Diagnosis:  Final diagnoses:  Schizophrenia, paranoid type (HCC)    History of Present illness: [name] is a 30 y.o. male.  Patient is well-known to this provider.  Patient presents voluntarily as a walk-in to the [facility] reporting that he is feeling for his life.  Patient denies any suicidal homicidal ideations and denies having any hallucinations.  Patient is seen he is lying on the couch resting.  Patient was asked why he was back and he stated that he was fearing for his life.  He denied continuing his medications after he was here on 01/09/2021.  He denies contacting his act team as he was supposed to.  Patient also refuses the [place] injection again.  Patient was informed that with continued noncompliance with medications and treatment that the best option for him would be to be his restarted on the [place] injection.  Patient again refuses and states that he just wants to have one of his Invega pills.  Patient's Hinda Glatter was increased to 9 mg on his last visit and his ACT team was notified of this.  Patient's ACT team will be notified today about the patient's presentation.  They did report the last visit that the patient has a fully furnished apartment that he will not go staying in because he does not stay on his medications and is paranoid.  Patient does not present an imminent threat to himself or to anyone else and is psychiatric cleared.  Psychiatric Specialty Exam  Presentation  General Appearance:Appropriate for Environment; Casual  Eye Contact:Good  Speech:Clear and Coherent; Normal Rate  Speech Volume:Normal  Handedness:Right   Mood and Affect  Mood:Anxious  Affect:Appropriate; Congruent   Thought Process  Thought Processes:Coherent  Descriptions of Associations:Intact  Orientation:Full (Time, Place and  Person)  Thought Content:WDL  Diagnosis of Schizophrenia or Schizoaffective disorder in past: Yes  Duration of Psychotic Symptoms: Greater than six months  Hallucinations:None  Ideas of Reference:Paranoia  Suicidal Thoughts:No  Homicidal Thoughts:No   Sensorium  Memory:Immediate Good; Recent Good; Remote Good  Judgment:Fair  Insight:Lacking   Executive Functions  Concentration:Fair  Attention Span:Fair  Recall:Good  Fund of Knowledge:Good  Language:Good   Psychomotor Activity  Psychomotor Activity:Normal   Assets  Assets:Communication Skills; Desire for Improvement; Financial Resources/Insurance; Housing; Social Support; Physical Health   Sleep  Sleep:Good  Number of hours: 2   No data recorded  Physical Exam: Physical Exam Vitals and nursing note reviewed.  Constitutional:      Appearance: He is well-developed.  HENT:     Head: Normocephalic.  Eyes:     Pupils: Pupils are equal, round, and reactive to light.  Cardiovascular:     Rate and Rhythm: Normal rate.  Pulmonary:     Effort: Pulmonary effort is normal.  Musculoskeletal:        General: Normal range of motion.  Neurological:     Mental Status: He is alert and oriented to person, place, and time.    Review of Systems  Constitutional: Negative.   HENT: Negative.   Eyes: Negative.   Respiratory: Negative.   Cardiovascular: Negative.   Gastrointestinal: Negative.   Genitourinary: Negative.   Musculoskeletal: Negative.   Skin: Negative.   Neurological: Negative.   Endo/Heme/Allergies: Negative.   Psychiatric/Behavioral: Negative.    Blood pressure 133/90, pulse 89, temperature (!) 97.4 F (36.3 C), temperature source Oral, resp.  rate 18, SpO2 95 %. There is no height or weight on file to calculate BMI.  Musculoskeletal: Strength & Muscle Tone: within normal limits Gait & Station: normal Patient leans: N/A   [facility] MSE Discharge Disposition for Follow up and  Recommendations: Based on my evaluation the patient does not appear to have an emergency medical condition and can be discharged with resources and follow up care in outpatient services for Medication Management, Individual Therapy and follow up with ACT team   Maryfrances Bunnell, FNP 01/11/2021, 7:22 AM

## 2021-01-11 NOTE — Discharge Instructions (Addendum)

## 2021-01-11 NOTE — Progress Notes (Signed)
CSW spoke with [facility] APS and made APS report regarding concerns of pt not being able to get into his home and not having access to his funds.    Reports from [place], NP report that patient has not been allowed into his apartment, however, there is a $350 light bill, concerns that pt's aunt, who is his payee, is taking pt funds and possible loss of other checks the patient receives.   Following information was relayed from [place], NP 01/11/2021 at 8:06AM: She states that there have been some concerns because no one has got to go to his apartment to see who is staying there and they received a $350 electric bill when they noted that the patient is not staying there.  They have concerns that the patient's aunt is keeping his money as well as preventing him from living in his apartment.  Template also reports that the patient has been receiving multiple checks that they are unsure who is receiving them and they suspect and has taken those as well.  He has been requested for an APS report to be made on behalf of the patient.   Penni Homans, MSW, LCSW 01/11/2021 11:11 AM

## 2021-01-11 NOTE — ED Provider Notes (Signed)
Received phone call back from ACT team lead.  She reports that they have been trying to get the patient to his apartment.  However, there is concerns for the patient's aunt being his payee.  She states that there have been some concerns because no one has got to go to his apartment to see who is staying there and they received a $350 electric bill when they noted that the patient is not staying there.  They have concerns that the patient's aunt is keeping his [employer] as well as preventing him from living in his apartment.  Template also reports that the patient has been receiving multiple checks that they are unsure who is receiving them and they suspect and has taken those as well.  He has been requested for an APS report to be made on behalf of the patient.  [facility] ACT team lead, [name], can be contacted at [phone]

## 2021-01-15 ENCOUNTER — Encounter (HOSPITAL_COMMUNITY): Payer: Self-pay | Admitting: Emergency Medicine

## 2021-01-15 ENCOUNTER — Emergency Department (HOSPITAL_COMMUNITY)
Admission: EM | Admit: 2021-01-15 | Discharge: 2021-01-16 | Disposition: A | Discharge status: Home / Self Care | Payer: Medicaid - Out of State | Attending: Emergency Medicine | Admitting: Emergency Medicine

## 2021-01-15 ENCOUNTER — Other Ambulatory Visit: Payer: Self-pay

## 2021-01-15 DIAGNOSIS — F419 Anxiety disorder, unspecified: Secondary | ICD-10-CM | POA: Insufficient documentation

## 2021-01-15 DIAGNOSIS — F17210 Nicotine dependence, cigarettes, uncomplicated: Secondary | ICD-10-CM | POA: Diagnosis not present

## 2021-01-15 LAB — CBC WITH DIFFERENTIAL/PLATELET
Abs Immature Granulocytes: 0.01 10*3/uL (ref 0.00–0.07)
Basophils Absolute: 0.1 10*3/uL (ref 0.0–0.1)
Basophils Relative: 1 %
Eosinophils Absolute: 0.6 10*3/uL — ABNORMAL HIGH (ref 0.0–0.5)
Eosinophils Relative: 9 %
HCT: 45.3 % (ref 39.0–52.0)
Hemoglobin: 15.4 g/dL (ref 13.0–17.0)
Immature Granulocytes: 0 %
Lymphocytes Relative: 44 %
Lymphs Abs: 3.1 10*3/uL (ref 0.7–4.0)
MCH: 31 pg (ref 26.0–34.0)
MCHC: 34 g/dL (ref 30.0–36.0)
MCV: 91.1 fL (ref 80.0–100.0)
Monocytes Absolute: 0.6 10*3/uL (ref 0.1–1.0)
Monocytes Relative: 8 %
Neutro Abs: 2.6 10*3/uL (ref 1.7–7.7)
Neutrophils Relative %: 38 %
Platelets: 283 10*3/uL (ref 150–400)
RBC: 4.97 MIL/uL (ref 4.22–5.81)
RDW: 13.2 % (ref 11.5–15.5)
WBC: 6.9 10*3/uL (ref 4.0–10.5)
nRBC: 0 % (ref 0.0–0.2)

## 2021-01-15 NOTE — ED Triage Notes (Addendum)
Per EMS, patient from gas [place], reluctant to give EMS information. Reports difficulty breathing. In no distress with EMS. Uncooperative with EMS.  During triage, patient denies difficulty breathing and reports he is anxious and feeling scared for his life. Denies SI/HI. Cooperative during triage.

## 2021-01-15 NOTE — ED Notes (Signed)
Patient began TTS consult.

## 2021-01-15 NOTE — ED Notes (Signed)
Asked patient to provide urine sample. 

## 2021-01-15 NOTE — ED Triage Notes (Signed)
Emergency Medicine Provider Triage Evaluation Note  [name] , a 30 y.o. male  was evaluated in triage.  Pt complains of anxiety and being fearful for his life.  Patient reports that he has a history of schizophrenia in the next medication.  Patient denies taking medication at home.  Patient denies any suicidal, homicidal ideations, auditory hallucinations, or visual hallucinations.  Review of Systems  Positive: Anxiety  Negative: suicidal, homicidal ideations, auditory hallucinations, or visual hallucinations  Physical Exam  BP (!) 149/92 (BP Location: Left Arm)   Pulse 91   Temp 98.6 F (37 C) (Oral)   Resp 18   Ht 5\' 7"  (1.702 m)   Wt 70.3 kg   SpO2 100%   BMI 24.28 kg/m  Gen:   Awake, no distress   HEENT:  Atraumatic  Resp:  Normal effort  Cardiac:  Normal rate  MSK:   Moves extremities without difficulty  Neuro:  Speech clear   Medical Decision Making  Medically screening exam initiated at 9:40 PM.  Appropriate orders placed.  [name] was informed that the remainder of the evaluation will be completed by another provider, this initial triage assessment does not replace that evaluation, and the importance of remaining in the ED until their evaluation is complete.  Clinical Impression   The patient appears stable so that the remainder of the work up may be completed by another provider.      , Haskel Schroeder 01/15/21 2141

## 2021-01-15 NOTE — BH Assessment (Signed)
TTS spoke to [facility], to put pt in room to complete TTS assessment.  Clinician to call the cart in 10 minutes.

## 2021-01-16 ENCOUNTER — Ambulatory Visit (HOSPITAL_COMMUNITY)
Admission: AD | Admit: 2021-01-16 | Discharge: 2021-01-16 | Disposition: A | Discharge status: Home / Self Care | Payer: Medicaid Other | Attending: Psychiatry | Admitting: Psychiatry

## 2021-01-16 LAB — COMPREHENSIVE METABOLIC PANEL
ALT: 17 U/L (ref 0–44)
AST: 23 U/L (ref 15–41)
Albumin: 4.2 g/dL (ref 3.5–5.0)
Alkaline Phosphatase: 47 U/L (ref 38–126)
Anion gap: 9 (ref 5–15)
BUN: 18 mg/dL (ref 6–20)
CO2: 28 mmol/L (ref 22–32)
Calcium: 9.1 mg/dL (ref 8.9–10.3)
Chloride: 100 mmol/L (ref 98–111)
Creatinine, Ser: 1.19 mg/dL (ref 0.61–1.24)
GFR, Estimated: >60 mL/min (ref >60)
Glucose, Bld: 97 mg/dL (ref 70–99)
Potassium: 3.5 mmol/L (ref 3.5–5.1)
Sodium: 137 mmol/L (ref 135–145)
Total Bilirubin: 1 mg/dL (ref 0.3–1.2)
Total Protein: 7.1 g/dL (ref 6.5–8.1)

## 2021-01-16 LAB — ACETAMINOPHEN LEVEL: Acetaminophen (Tylenol), Serum: <10 ug/mL — ABNORMAL LOW (ref 10–30)

## 2021-01-16 LAB — SALICYLATE LEVEL: Salicylate Lvl: <7 mg/dL — ABNORMAL LOW (ref 7.0–30.0)

## 2021-01-16 MED ORDER — PALIPERIDONE PALMITATE ER 156 MG/ML IM SUSY
156.0000 mg | PREFILLED_SYRINGE | Freq: Once | INTRAMUSCULAR | Status: AC
  Administered 2021-01-16: 156 mg via INTRAMUSCULAR

## 2021-01-16 NOTE — ED Provider Notes (Signed)
[facility]-EMERGENCY DEPT Provider Note   CSN: [id number] Arrival date & time: 01/15/21  2102     History Chief Complaint  Patient presents with  . Anxiety    [name] is a 30 y.o. male.  Thinks people are going to get him. Requesting mental health evaluation. States he needs some different medications.   On review of records it appears patient is often here with a similar issue and just left a couple days ago.    Anxiety This is a recurrent problem. The current episode started less than 1 hour ago. The problem occurs constantly. The problem has not changed since onset.Pertinent negatives include no chest pain, no abdominal pain, no headaches and no shortness of breath. Nothing aggravates the symptoms. Nothing relieves the symptoms.       Past Medical History:  Diagnosis Date  . Depression   . Schizophrenia [facility])     Patient Active Problem List   Diagnosis Date Noted  . 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 Schizophrenia, paranoid type (HCC) 10/24/2020  . Other schizophrenia (HCC) 02/14/2018  . Cannabis use disorder, moderate, dependence (HCC) 05/10/2016    Past Surgical History:  Procedure Laterality Date  . BACK SURGERY         No family history on file.  Social History   Tobacco Use  . Smoking status: Current Some Day Smoker    Types: Cigarettes  . Smokeless tobacco: Never Used  Vaping Use  . Vaping Use: Never used  Substance Use Topics  . Alcohol use: No  . Drug use: Yes    Types: Marijuana    Comment: rare    Home Medications Prior to Admission medications   Medication Sig Start Date End Date Taking? Authorizing Provider  paliperidone (INVEGA) 6 MG 24 hr tablet Take 1 tablet (6 mg total) by mouth daily for 14 days. 01/02/21 01/16/21  Mare Ferrari, PA-C    Allergies    Shellfish allergy and Bee pollen  Review of Systems   Review of Systems  Respiratory: Negative for shortness of breath.    Cardiovascular: Negative for chest pain.  Gastrointestinal: Negative for abdominal pain.  Neurological: Negative for headaches.  All other systems reviewed and are negative.   Physical Exam Updated Vital Signs BP (!) 149/92 (BP Location: Left Arm)   Pulse 88   Temp 98.6 F (37 C) (Oral)   Resp 19   Ht 5\' 7"  (1.702 m)   Wt 70.3 kg   SpO2 100%   BMI 24.28 kg/m   Physical Exam Vitals and nursing note reviewed.  Constitutional:      Appearance: He is well-developed.  HENT:     Head: Normocephalic and atraumatic.     Mouth/Throat:     Mouth: Mucous membranes are moist.     Pharynx: Oropharynx is clear.  Eyes:     Pupils: Pupils are equal, round, and reactive to light.  Cardiovascular:     Rate and Rhythm: Normal rate.  Pulmonary:     Effort: Pulmonary effort is normal. No respiratory distress.  Abdominal:     General: Abdomen is flat. There is no distension.  Musculoskeletal:        General: Normal range of motion.     Cervical back: Normal range of motion.  Skin:    General: Skin is dry.     Coloration: Skin is not jaundiced or pale.  Neurological:     General: No focal deficit present.  Mental Status: He is alert.     ED Results / Procedures / Treatments   Labs (all labs ordered are listed, but only abnormal results are displayed) Labs Reviewed  CBC WITH DIFFERENTIAL/PLATELET - Abnormal; Notable for the following components:      Result Value   Eosinophils Absolute 0.6 (*)    All other components within normal limits  SALICYLATE LEVEL - Abnormal; Notable for the following components:   Salicylate Lvl <7.0 (*)    All other components within normal limits  ACETAMINOPHEN LEVEL - Abnormal; Notable for the following components:   Acetaminophen (Tylenol), Serum <10 (*)    All other components within normal limits  COMPREHENSIVE METABOLIC PANEL  URINALYSIS, ROUTINE W REFLEX MICROSCOPIC    EKG None  Radiology No results found.  Procedures Procedures    Medications Ordered in ED Medications - No data to display  ED Course  I have reviewed the triage vital signs and the nursing notes.  Pertinent labs & imaging results that were available during my care of the patient were reviewed by me and considered in my medical decision making (see chart for details).    MDM Rules/Calculators/A&P                          Difficult historian but seems to be at psychiatric baseline. Doesn't endorse any physical ailments. TTS thinks at baseline. Will dc with outpatient management.   Final Clinical Impression(s) / ED Diagnoses Final diagnoses:  Anxiety    Rx / DC Orders ED Discharge Orders    None       Jordanna Hendrie, Barbara Cower, MD 01/16/21 [phone]

## 2021-01-16 NOTE — BH Assessment (Signed)
Comprehensive Clinical Assessment (CCA) Note  01/16/2021 [name] [id number]  Chief Complaint:  Chief Complaint  Patient presents with  . Anxiety   Visit Diagnosis:  Per Chart:  Schizophrenia, Paranoid type Hcc     Flowsheet Row ED from 01/09/2021 in [facility] OP Visit [facility] from 01/02/2021 in [facility]  C-SSRS RISK CATEGORY No Risk No Risk No Risk     The patient demonstrates the following risk factors for suicide: Chronic risk factors for suicide include:psychiatric disorder of schizophrenia and  history of paranoid  . Acute risk factors for suicide include: family or marital conflict, social withdrawal/isolation and loss (financial, interpersonal, professional). Protective factors for this patient include: positive therapeutic relationship, coping skills and hope for the future. Considering these factors, the overall suicide risk at this point appears to be low. Patient is appropriate for outpatient follow up.  Disposition: [name] NP, patient does not meet inpatient criteria. Pt discharged and follow-up with  [facility].  Disposition discussed with [name]  Via secure chat in Epic RN to discuss disposition with EDP.  [name] is a 30 years old single male who presents voluntarily to [facility], via EMS unaccompanied.  Pt reports that he is scared but does not know why.  Pt acknowledged that he isolates and and "I am tired".  Pt reports that he have not been sleeping at all; also reported that he has not been eating, pt didn't discuss reason for not sleeping or eating. Pt denies any SI and past suicide attempts.  Pt denies HI, AVH.  Pt deniess manic episodes.  Pt was unable to explain, Pt  says "I don't know" and passivise about answering the questions; also during the consultation, writer had to redirect, the Pt  would spin around on the stool in the room;  also was watching his phone.   Pt says he does not use alcohol or marijuana, admitted to using it in the past.  Pt identifies his primary stressor as wanting money for a room and money for transportation for tonight. Pt was asked about his guardianship and he comfirmed that his aunt [name] [phone] is his guardian. Pt denies history of mental illness and substance use.  Pt denies any history of abuse, he did report the death of his mother.  Pt says he is not currently receiving weekly outpatient therapy; also, not receiving medication management.  Writer asked about ACTT services, he didn't elaborate about the services.  Pt reports one previous hospital stay at [facility].  Pt is dressed disheveled, alert, oriented x 3 with normal speech and restless motor behavior.  Eye contact is fleeting.  Pt's mood is irritable and affect is flat. Pt's insight is poor and judgement was poor.  Pt is not currently responding to internal stimuli or experiencing delusional thought content.  Pt was guarded throughout assessment.    CCA Screening, Triage and Referral (STR)  Patient Reported Information How did you hear about [place]? Legal System  Referral name: [facility] Police Department  Referral phone number: 0 (N/A)   Whom do you see for routine medical problems? I don't have a doctor  Practice/Facility Name: No data recorded Practice/Facility Phone Number: No data recorded Name of Contact: No data recorded Contact Number: No data recorded Contact Fax Number: No data recorded Prescriber Name: No data recorded Prescriber Address (if known): No data recorded  What Is the Reason for Your Visit/Call  Today? Paranoia  How Long Has This Been Causing You Problems? <Week  What Do You Feel Would Help You the Most Today? Treatment for Depression or other mood problem; Housing Assistance; Social Support   Have You Recently Been in Any Inpatient Treatment (Hospital/Detox/Crisis Center/28-Day Program)?  No  Name/Location of Program/Hospital:[facility]  How Long Were You There? Per chart, "2 days."  When Were You Discharged? 02/24/2020 (Per chart.)   Have You Ever Received Services From [facility] Before? Yes  Who Do You See at [facility]? ED, 01/11/21   Have You Recently Had Any Thoughts About Hurting Yourself? No  Are You Planning to Commit Suicide/Harm Yourself At This time? No   Have you Recently Had Thoughts About Hurting Someone [name]? No  Explanation: No data recorded  Have You Used Any Alcohol or Drugs in the Past 24 Hours? No  How Long Ago Did You Use Drugs or Alcohol? 0000 (Unclear)  What Did You Use and How Much? Pt reports no use of any substance   Do You Currently Have a Therapist/Psychiatrist? No (Pt prior information connects him to [facility])  Name of Therapist/Psychiatrist: ACTT Team - agency name unknown   Have You Been Recently Discharged From Any Office Practice or Programs? No  Explanation of Discharge From Practice/Program: No data recorded    CCA Screening Triage Referral Assessment Type of Contact: Tele-Assessment  Is this Initial or Reassessment? Initial Assessment  Date Telepsych consult ordered in [facility]:  01/16/2021  Time Telepsych consult ordered in [facility]:  0104   Patient Reported Information Reviewed? No  Patient Left Without Being Seen? No  Reason for Not Completing Assessment: No data recorded  Collateral Involvement: Pt reports his aunt, [name], is his guardian   Does Patient Have a Automotive engineer Guardian? No data recorded Name and Contact of Legal Guardian: [name], legal guardian.   If Minor and Not Living with Parent(s), Who has Custody? n/a  Is CPS involved or ever been involved? Never  Is APS involved or ever been involved? -- (UTA)   Patient Determined To Be At Risk for Harm To Self or Others Based on Review of Patient Reported Information or Presenting Complaint? No  Method: No  Plan  Availability of Means: No access or NA  Intent: Vague intent or NA  Notification Required: No need or identified person  Additional Information for Danger to Others Potential: Active psychosis  Additional Comments for Danger to Others Potential: History of children playing outside triggering patient.  Are There Guns or Other Weapons in Your Home? No  Types of Guns/Weapons: No data recorded Are These Weapons Safely Secured?                            No data recorded Who Could Verify You Are Able To Have These Secured: No data recorded Do You Have any Outstanding Charges, Pending Court Dates, Parole/Probation? None noted  Contacted To Inform of Risk of Harm To Self or Others: Other: Comment (BIB EMS)   Location of Assessment: [facility] ED   Does Patient Present under Involuntary Commitment? No  IVC Papers Initial File Date: 10/22/2020   [place] of Residence: [place]   Patient Currently Receiving the Following Services: ACTT Engineer, agricultural Treatment); Medication Management   Determination of Need: Routine (7 days)   Options For Referral: -- (Pt wants money for transportation)     CCA Biopsychosocial Intake/Chief Complaint:  Paranoia  Current Symptoms/Problems: isolating, fatigue  Patient Reported Schizophrenia/Schizoaffective Diagnosis in Past: Yes   Strengths: UTA  Preferences: UTA  Abilities: UTA   Type of Services Patient Feels are Needed: Pt reports he wants medicine for his mind   Initial Clinical Notes/Concerns: withdrawn   Mental Health Symptoms Depression:  None   Duration of Depressive symptoms: Greater than two weeks   Mania:  None   Anxiety:   Restlessness; Irritability; Fatigue; Worrying   Psychosis:  Other negative symptoms (Pt continue to talk watch his phone during the interview and turning around on stool.)   Duration of Psychotic symptoms: Less than six months   Trauma:  Re-experience of traumatic event (Pt reports the  dealth of his mother)   Obsessions:  Poor insight; Recurrent & persistent thoughts/impulses/images   Compulsions:  Repeated behaviors/mental acts; Disrupts with routine/functioning   Inattention:  Disorganized; Does not follow instructions (not oppositional); Does not seem to listen; Fails to pay attention/makes careless mistakes; Poor follow-through on tasks   Hyperactivity/Impulsivity:  Feeling of restlessness; Fidgets with hands/feet (Pt remain on his phone during the consult, also, used the stool to go around while talking to examiner.)   Oppositional/Defiant Behaviors:  Defies rules; Easily annoyed; Intentionally annoying   Emotional Irregularity:  Mood lability; Potentially harmful impulsivity; Transient, stress-related paranoia/disassociation   Other Mood/Personality Symptoms:  paranoia    Mental Status Exam Appearance and self-care  Stature:  Average   Weight:  Average weight   Clothing:  Disheveled   Grooming:  Neglected   Cosmetic use:  Age appropriate   Posture/gait:  Normal   Motor activity:  Agitated   Sensorium  Attention:  Distractible   Concentration:  Preoccupied   Orientation:  Object; Person; Place; Situation   Recall/memory:  Normal   Affect and Mood  Affect:  Flat   Mood:  Irritable   Relating  Eye contact:  Fleeting   Facial expression:  Tense   Attitude toward examiner:  Guarded   Thought and Language  Speech flow: Flight of Ideas   Thought content:  Suspicious   Preoccupation:  None   Hallucinations:  Other (Comment) (Pt reports that he is scared, unable to explain what, when, why he is afraid.)   Organization:  No data recorded  [employer] of Knowledge:  Poor   Intelligence:  Needs investigation   Abstraction:  Functional   Judgement:  Poor   Reality Testing:  Distorted   Insight:  Poor   Decision Making:  Impulsive   Social Functioning  Social Maturity:  Impulsive   Social Judgement:  "Street  Smart"   Stress  Stressors:  Financial (Pt reports that he needs money for a room and transportation)   Coping Ability:  Normal   Skill Deficits:  Activities of daily living; Intellect/education; Interpersonal; Self-care; Self-control   Supports:  Support needed; Friends/Service system     Religion: Religion/Spirituality Are You A Religious Person?:  Industrial/product designer)  Leisure/Recreation: Leisure / Recreation Do You Have Hobbies?: No  Exercise/Diet: Exercise/Diet Do You Exercise?: No Have You Gained or Lost A Significant Amount of Weight in the Past Six Months?:  (UTA) Do You Follow a Special Diet?: No (Pt reports that he have not been eating, asked why "I don't know') Do You Have Any Trouble Sleeping?: Yes Explanation of Sleeping Difficulties: Pt reports that he have not been sleeping, "I don't know, I need some transportation and money for a room'.'   CCA Employment/Education Employment/Work Situation: Employment / Work Situation Employment situation: On disability Why  is patient on disability: Schizophrenia How long has patient been on disability: Unknown Patient's job has been impacted by current illness:  (UTA) What is the longest time patient has a held a job?: Not assessed Where was the patient employed at that time?: Not assessed Has patient ever been in the Eli Lilly and Companymilitary?: No  Education: Education Last Grade Completed: 9 Name of [employer] School: Not assessed Did Garment/textile technologistYou Graduate From [employer]High School?: No Did You Product managerAttend College?: No Did Designer, television/film setYou Attend Graduate School?: No Did You Have Any Special Interests In School?: Not assessed Did You Have An Individualized Education Program (IIEP): No Did You Have Any Difficulty At School?: No Patient's Education Has Been Impacted by Current Illness:  (UTA)   CCA Family/Childhood History Family and Relationship History: Family history Marital status: Single What is your sexual orientation?: Not assessed Has your sexual activity been affected by  drugs, alcohol, medication, or emotional stress?: Not assessed  Childhood History:  Childhood History Additional childhood history information: Not assessed Description of patient's relationship with caregiver when they were a child: Not assessed Patient's description of current relationship with people who raised him/her: Not assessed How were you disciplined when you got in trouble as a child/adolescent?: Not assessed Does patient have siblings?:  (UTA) Did patient suffer any verbal/emotional/physical/sexual abuse as a child?:  (UTA) Did patient suffer from severe childhood neglect?:  (UTA) Has patient ever been sexually abused/assaulted/raped as an adolescent or adult?: No Was the patient ever a victim of a crime or a disaster?: No Witnessed domestic violence?: No Has patient been affected by domestic violence as an adult?: No  Child/Adolescent Assessment:     CCA Substance Use Alcohol/Drug Use: Alcohol / Drug Use Pain Medications: see MAR Prescriptions: see MAR Over the Counter: see MAR History of alcohol / drug use?: Yes Longest period of sobriety (when/how long): Unknown Negative Consequences of Use:  (Not assessed) Withdrawal Symptoms:  (Denies) Substance #1 Name of Substance 1: Marijuana 1 - Age of First Use: unknown 1 - Amount (size/oz): unknown 1 - Frequency: daily 1 - Duration: ongoing 1 - Last Use / Amount: UTA 1 - Method of Aquiring: unknown 1- Route of Use: UTA                       ASAM's:  Six Dimensions of Multidimensional Assessment  Dimension 1:  Acute Intoxication and/or Withdrawal Potential:   Dimension 1:  Description of individual's past and current experiences of substance use and withdrawal: Pt reports that he use to smoke marijuana.  Dimension 2:  Biomedical Conditions and Complications:   Dimension 2:  Description of patient's biomedical conditions and  complications: Pt did not report medical  Dimension 3:  Emotional, Behavioral, or  Cognitive Conditions and Complications:  Dimension 3:  Description of emotional, behavioral, or cognitive conditions and complications: Schizophrenia, Paranoid  Dimension 4:  Readiness to Change:  Dimension 4:  Description of Readiness to Change criteria: Pt reports that he is not taking medicaiton  Dimension 5:  Relapse, Continued use, or Continued Problem Potential:  Dimension 5:  Relapse, continued use, or continued problem potential critiera description: precontemplation  Dimension 6:  Recovery/Living Environment:  Dimension 6:  Recovery/Iiving environment criteria description: Pt reports that he lives in different areas  ASAM Severity Score: ASAM's Severity Rating Score: 16  ASAM Recommended Level of Treatment: ASAM Recommended Level of Treatment: Level I Outpatient Treatment   Substance use Disorder (SUD) Substance Use Disorder (SUD)  Checklist Symptoms of  Substance Use: Continued use despite having a persistent/recurrent physical/psychological problem caused/exacerbated by use,Continued use despite persistent or recurrent social, interpersonal problems, caused or exacerbated by use (Pt denies SA)  Recommendations for Services/Supports/Treatments: Recommendations for Services/Supports/Treatments Recommendations For Services/Supports/Treatments: Individual Therapy,Medication Management (Continuing Observation Unit)  DSM5 Diagnoses: Patient Active Problem List   Diagnosis Date Noted  . 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 Schizophrenia, paranoid type (HCC) 10/24/2020  . Other schizophrenia (HCC) 02/14/2018  . Cannabis use disorder, moderate, dependence (HCC) 05/10/2016      Referrals to Alternative Service(s): Referred to Alternative Service(s):   Place:   Date:   Time:    Referred to Alternative Service(s):   Place:   Date:   Time:    Referred to Alternative Service(s):   Place:   Date:   Time:    Referred to Alternative Service(s):   Place:   Date:   Time:     Meryle Ready, Counselor

## 2021-01-16 NOTE — BH Assessment (Signed)
Comprehensive Clinical Assessment (CCA) Note  01/16/2021 Talitha GivensMarlo Benham [id number] DISPOSITION:  Gave clinical report to Laurey MoraleBrook Leevy-Johnson, NP, who determined that Pt shall receive IM Invega and be discharged.  The patient demonstrates the following risk factors for suicide: Chronic risk factors for suicide include: psychiatric disorder of Schizophrenia. Acute risk factors for suicide include: unemployment. Protective factors for this patient include: positive social support. Considering these factors, the overall suicide risk at this point appears to be low. Patient is appropriate for outpatient follow up.  Flowsheet Row [facility] from [facility] from 01/02/2021 in [facility]  C-SSRS RISK CATEGORY No Risk No Risk No Risk      Chief Complaint:  Chief Complaint  Patient presents with  . MDD   Visit Diagnosis: Schizophrenia, paranoid  NARRATIVE:  Pt is a 30 year old male who [facility] without stated complaint.  His intake sheet was left blank except for basic demographic information.  [facility]  The narrative from that assessment is as follows:  Talitha GivensMarlo Lowery is a 30 years old single male who [facility] unaccompanied.  Pt reports that he is scared but does not know why.  Pt acknowledged that he isolates and and "I am tired".  Pt reports that he have not been sleeping at all; also reported that he has not been eating, pt didn't discuss reason for not sleeping or eating. Pt denies any SI and past suicide attempts.  Pt denies HI, AVH.  Pt deniess manic episodes.  Pt was unable to explain, Pt  says "I don't know" and passivise about answering the questions; also during the consultation, writer had to redirect, the Pt  would spin around on the stool in the room; also  was watching his phone.   Pt says he does not use alcohol or marijuana, admitted to using it in the past. Pt identifies his primary stressor as wanting money for a room and money for transportation for tonight. Pt was asked about his guardianship and he comfirmed that his aunt Ronnie DerbyLavern Luck [phone] is his guardian. Pt denies history of mental illness and substance use.  Pt denies any history of abuse, he did report the death of his mother.  Per report, [facility].  During this morning's presentation, Pt denied suicidal ideation, homicidal ideation, substance use concerns, hallucination, and self-injurious behavior.  Pt has a history of using marijuana.  To other questions, Pt appeared guarded, and he responded, ''I don't know'' to most questions, including ''Why are you here today?"  Per notes, Pt has a history of Schizophrenia and accompanying paranoia.  During assessment, Pt presented as oriented to time, place, and name.  He had good eye contact and was appropriately groomed.  Pt's psychomotor was normal.  Pt's mood was negative/empty, and affect was flat.  Speech was spare.  Thought content was within normal range, and Pt exhibited concrete thinking.  Thought blocking was also present.  Pt's memory could not be adequately assessed.  Pt's concentration was good.  Impulse control, judgment, and insight were poor.    Author contacted Okey Regalarol with [facility] and advised of plan to provide Pt with IM Invega and to discharge to their care.   CCA Screening, Triage and Referral (STR)  Patient Reported Information How did you hear  about [place]? Self  Referral name: Pt presented on his own today  Referral phone number: 0 (N/A)   Whom do you see for routine medical problems? I don't have a doctor  Practice/Facility Name: No data recorded Practice/Facility Phone Number: No data recorded Name of Contact: No data recorded Contact Number: No data recorded Contact Fax Number: No  data recorded Prescriber Name: No data recorded Prescriber Address (if known): No data recorded  What Is the Reason for Your Visit/Call Today? ''I don't know'' -- earlier today, he presented with paranoia  How Long Has This Been Causing You Problems? <Week  What Do You Feel Would Help You the Most Today? Social Support; Housing Assistance; Treatment for Depression or other mood problem   Have You Recently Been in Any Inpatient Treatment (Hospital/Detox/Crisis Center/28-Day Program)? No  Name/Location of Program/Hospital:[facility]  How Long Were You There? Per chart, "2 days."  When Were You Discharged? 02/24/2020 (Per chart.)   Have You Ever Received Services From [facility] Before? Yes  Who Do You [facility] at [facility]? ED 01/11/2021; TTS and ED 01/15/2021   Have You Recently Had Any Thoughts About Hurting Yourself? No  Are You Planning to Commit Suicide/Harm Yourself At This time? No   Have you Recently Had Thoughts About Hurting Someone [name]? No  Explanation: No data recorded  Have You Used Any Alcohol or Drugs in the Past 24 Hours? No  How Long Ago Did You Use Drugs or Alcohol? 0000 (Unclear)  What Did You Use and How Much? Pt denied   Do You Currently Have a Therapist/Psychiatrist? No  Name of Therapist/Psychiatrist: ACTT Team - agency name unknown   Have You Been Recently Discharged From Any Office Practice or Programs? No  Explanation of Discharge From Practice/Program: No data recorded    CCA Screening Triage Referral Assessment Type of Contact: Face-to-Face  Is this Initial or Reassessment? Initial Assessment  Date Telepsych consult ordered in [facility]:  01/16/2021  Time Telepsych consult ordered in [facility]:  0104   Patient Reported Information Reviewed? No  Patient Left Without Being Seen? No  Reason for Not Completing Assessment: No data recorded  Collateral Involvement: Per report, Pt's aunt [name] is his guardian   Does Patient Have a Production manager Guardian? No data recorded Name and Contact of Legal Guardian: [name], legal guardian.   If Minor and Not Living with Parent(s), Who has Custody? n/a  Is CPS involved or ever been involved? Never  Is APS involved or ever been involved? Never   Patient Determined To Be At Risk for Harm To Self or Others Based on Review of Patient Reported Information or Presenting Complaint? No  Method: No Plan  Availability of Means: No access or NA  Intent: Vague intent or NA  Notification Required: No need or identified person  Additional Information for Danger to Others Potential: Active psychosis  Additional Comments for Danger to Others Potential: History of children playing outside triggering patient.  Are There Guns or Other Weapons in Your Home? No  Types of Guns/Weapons: No data recorded Are These Weapons Safely Secured?                            No data recorded Who Could Verify You Are Able To Have These Secured: No data recorded Do You Have any Outstanding Charges, Pending Court Dates, Parole/Probation? None noted  Contacted To Inform of Risk of Harm To Self or  Others: Other: Comment (BIB EMS)   [facility] of Assessment: [facility]   Does Patient Present under Involuntary Commitment? No  IVC Papers Initial File Date: 10/22/2020   [place] of Residence: [place]   Patient Currently Receiving the Following Services: ACTT Psychologist, educational)   Determination of Need: Routine (7 days)   Options For Referral: Medication Management; BH Urgent Care     CCA Biopsychosocial Intake/Chief Complaint:  Earlier today, Pt presented with paranoia  Current Symptoms/Problems: Isolation, fatigued   Patient Reported Schizophrenia/Schizoaffective Diagnosis in Past: Yes   Strengths: UTA  Preferences: UTA  Abilities: UTA   Type of Services Patient Feels are Needed: Pt said he did not know.  Earlier, he stated that he wanted  medication for his mind   Initial Clinical Notes/Concerns: Pt very guarded, denied SI, HI, AVH, self-injurious behavior, and substance use concerns   Mental Health Symptoms Depression:  Change in energy/activity   Duration of Depressive symptoms: Greater than two weeks   Mania:  Change in energy/activity   Anxiety:   Restlessness; Fatigue   Psychosis:  Affective flattening/alogia/avolition   Duration of Psychotic symptoms: Less than six months   Trauma:  Re-experience of traumatic event (Pt reports the dealth of his mother)   Obsessions:  Poor insight; Recurrent & persistent thoughts/impulses/images   Compulsions:  Repeated behaviors/mental acts; Disrupts with routine/functioning   Inattention:  Disorganized; Does not follow instructions (not oppositional); Does not seem to listen; Fails to pay attention/makes careless mistakes; Poor follow-through on tasks   Hyperactivity/Impulsivity:  Feeling of restlessness; Fidgets with hands/feet (Pt remain on his phone during the consult, also, used the stool to go around while talking to examiner.)   Oppositional/Defiant Behaviors:  Defies rules; Easily annoyed; Intentionally annoying   Emotional Irregularity:  Chronic feelings of emptiness   Other Mood/Personality Symptoms:  paranoia (per earlier report)    Mental Status Exam Appearance and self-care  Stature:  Average   Weight:  Average weight   Clothing:  Disheveled   Grooming:  Neglected   Cosmetic use:  Age appropriate   Posture/gait:  Normal   Motor activity:  Not Remarkable   Sensorium  Attention:  Normal   Concentration:  Normal   Orientation:  Person; Place; Time   Recall/memory:  Normal   Affect and Mood  Affect:  Flat   Mood:  Negative   Relating  Eye contact:  Normal   Facial expression:  Constricted   Attitude toward examiner:  Guarded   Thought and Language  Speech flow: Blocked; Paucity   Thought content:  Suspicious   Preoccupation:   None   Hallucinations:  None   Organization:  No data recorded  [employer] of Knowledge:  Average   Intelligence:  Needs investigation   Abstraction:  Concrete   Judgement:  Fair   Reality Testing:  Adequate   Insight:  Poor   Decision Making:  Only simple   Social Functioning  Social Maturity:  Isolates   Social Judgement:  "Street Smart"   Stress  Stressors:  Surveyor, quantity (Pt reports that he needs money for a room and transportation)   Coping Ability:  Exhausted   Skill Deficits:  Activities of daily living; Intellect/education; Interpersonal; Self-care; Self-control   Supports:  Support needed; Friends/Service system     Religion:    Leisure/Recreation:    Exercise/Diet: Exercise/Diet Do You Exercise?: No Have You Gained or Lost A Significant Amount of Weight in the Past Six Months?: No Do  You Follow a Special Diet?: No Do You Have Any Trouble Sleeping?: Yes Explanation of Sleeping Difficulties: Pt reports that he have not been sleeping, "I don't know, I need some transportation and money for a room'.'   CCA Employment/Education Employment/Work Situation: Employment / Work Situation Employment situation: On disability Why is patient on disability: Schizophrenia What is the longest time patient has a held a job?: NA Where was the patient employed at that time?: NA Has patient ever been in the [employer]?: No  Education: Education Is Patient Currently Attending School?: No Last Grade Completed: 9 Did Garment/textile technologist From [employer]?: No Did Theme park manager?: No Did Designer, television/film set?: No Did You Have Any Special Interests In School?: Not assessed Did You Have An Individualized Education Program (IIEP): No Did You Have Any Difficulty At School?: No Patient's Education Has Been Impacted by Current Illness: No   CCA Family/Childhood History Family and Relationship History: Family history Marital status: Single  Childhood  History:  Childhood History Has patient ever been sexually abused/assaulted/raped as an adolescent or adult?: No Was the patient ever a victim of a crime or a disaster?: No Witnessed domestic violence?: No Has patient been affected by domestic violence as an adult?: No  Child/Adolescent Assessment:     CCA Substance Use Alcohol/Drug Use: Alcohol / Drug Use Pain Medications: Please see MAR Prescriptions: Please see MAR Over the Counter: Please see MAR History of alcohol / drug use?: Yes Substance #1 Name of Substance 1: Marijuana 1 - Amount (size/oz): Varied 1 - Frequency: Daily 1 - Duration: Ongoing 1 - Last Use / Amount: 01/15/2021 1- Route of Use: Inhalation                       ASAM's:  Six Dimensions of Multidimensional Assessment  Dimension 1:  Acute Intoxication and/or Withdrawal Potential:   Dimension 1:  Description of individual's past and current experiences of substance use and withdrawal: Pt reports that he use to smoke marijuana.  Dimension 2:  Biomedical Conditions and Complications:   Dimension 2:  Description of patient's biomedical conditions and  complications: Pt did not report medical  Dimension 3:  Emotional, Behavioral, or Cognitive Conditions and Complications:  Dimension 3:  Description of emotional, behavioral, or cognitive conditions and complications: Schizophrenia, Paranoid  Dimension 4:  Readiness to Change:  Dimension 4:  Description of Readiness to Change criteria: Pt reports that he is not taking medicaiton  Dimension 5:  Relapse, Continued use, or Continued Problem Potential:  Dimension 5:  Relapse, continued use, or continued problem potential critiera description: precontemplation  Dimension 6:  Recovery/Living Environment:  Dimension 6:  Recovery/Iiving environment criteria description: Pt reports that he lives in different areas  ASAM Severity Score:    ASAM Recommended Level of Treatment: ASAM Recommended Level of Treatment: Level  I Outpatient Treatment   Substance use Disorder (SUD) Substance Use Disorder (SUD)  Checklist Symptoms of Substance Use: Continued use despite having a persistent/recurrent physical/psychological problem caused/exacerbated by use,Continued use despite persistent or recurrent social, interpersonal problems, caused or exacerbated by use  Recommendations for Services/Supports/Treatments: Recommendations for Services/Supports/Treatments Recommendations For Services/Supports/Treatments: Individual Therapy,Medication Management  DSM5 Diagnoses: Patient Active Problem List   Diagnosis Date Noted  . 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 Schizophrenia, paranoid type (HCC) 10/24/2020  . Paranoid schizophrenia (HCC) 01/05/2020  . Other schizophrenia (HCC) 02/14/2018  . Cannabis use disorder, moderate, dependence (HCC) 05/10/2016    Patient Centered Plan: Patient is  on the following Treatment Plan(s):     Referrals to Alternative Service(s): Referred to Alternative Service(s):   Place:   Date:   Time:    Referred to Alternative Service(s):   Place:   Date:   Time:    Referred to Alternative Service(s):   Place:   Date:   Time:    Referred to Alternative Service(s):   Place:   Date:   Time:     Earline Mayotte, [facility]

## 2021-01-16 NOTE — H&P (Addendum)
Behavioral Health Medical Screening Exam  [name] is an 30 y.o. male well known to [facility] with a history of schizophrenia who presented to [facility] as walk-in after being seen and discharged from [facility]. Per chart review patient was seen at [facility]: 04/03,05; 03/22 [facility]: 03/15, 23, 27, 31, [name]: 03/23, 24, [facility] 03/27, 04/02. Patient is currently being followed by [name], Team Lead [name] [phone]).   On assessment patient presents with similar presentation; states, "I don't know, I need help. I need my money. I'm tired". Provider contacted ACTT Team Lead [name] to inform her of patient's current location and obtain any needed collateral information. Per [name]'s report patient continues to refuse long-acting injection and present with ongoing symptoms. Provider discussed medication concerns with patient who agreed to receive injection from provider. Attending MD Clary consulted; [name] Sustenna 156mg  IM ordered and given in left deltoid. Patient tolerated the injection; vital signs stable. ACTT Team Lead notified, arranged for Team member to pick up patient 1030 am to transport home. Patient given breakfast tray and expressed appreciation.   Patient alert and oriented; calm and cooperative, appears to be baseline. Patient engaged in assessment and answered questions appropriately. Patient denies any suicidal or homicidal ideations, auditory or visual hallucinations, and does not appear to be actively psychotic at this time. Patient received long-acting injection [name] Sustenna 156 mg) and discharged to care of [name].   Total Time spent with patient: 30 minutes  Psychiatric Specialty Exam: Physical Exam Psychiatric:        Attention and Perception: Attention normal.        Mood and Affect: Affect is blunt.        Speech: Speech normal.        Behavior: Behavior is cooperative.    Review of Systems  Psychiatric/Behavioral: Negative.   All other systems reviewed  and are negative.  Blood pressure (!) 131/91, pulse 88, temperature 97.7 F (36.5 C), temperature source Oral, resp. rate 16.There is no height or weight on file to calculate BMI. General Appearance: Casual and Fairly Groomed Eye Contact:  Fair Speech:  Clear and Coherent and Normal Rate Volume:  Normal Mood:  Euthymic Affect:  Blunt Thought Process:  Goal Directed Orientation:  Other:  self, place, situation Thought Content:  baseline. "I need help. I need my money".  Suicidal Thoughts:  No Homicidal Thoughts:  No Memory:  Immediate;   Fair Recent;   Fair Judgement:  Other:  baseline Insight:  Shallow Psychomotor Activity:  Normal Concentration: Concentration: Fair and Attention Span: Fair Recall:  [employer] of Knowledge:Poor Language: Fair Akathisia:  NA Handed:   AIMS (if indicated):    Assets:  Desire for Improvement Financial Resources/Insurance Housing Leisure Time Physical Health Resilience Social Support Sleep:     Musculoskeletal: Strength & Muscle Tone: within normal limits Gait & Station: normal Patient leans: N/A  Blood pressure (!) 131/91, pulse 88, temperature 97.7 F (36.5 C), temperature source Oral, resp. rate 16.  Recommendations: Based on my evaluation the patient does not appear to have an emergency medical condition. Patient discharged to care of [name] ACTT Team.   [facility], NP 01/16/2021, 11:36 AM

## 2021-01-18 ENCOUNTER — Other Ambulatory Visit: Payer: Self-pay

## 2021-01-18 ENCOUNTER — Ambulatory Visit (HOSPITAL_COMMUNITY)
Admission: EM | Admit: 2021-01-18 | Discharge: 2021-01-19 | Disposition: A | Discharge status: Home / Self Care | Payer: Medicaid Other | Source: Home / Self Care

## 2021-01-18 ENCOUNTER — Encounter (HOSPITAL_COMMUNITY): Payer: Self-pay | Admitting: Emergency Medicine

## 2021-01-18 ENCOUNTER — Ambulatory Visit (HOSPITAL_COMMUNITY)
Admission: EM | Admit: 2021-01-18 | Discharge: 2021-01-18 | Disposition: A | Discharge status: Home / Self Care | Payer: Medicaid Other | Source: Home / Self Care

## 2021-01-18 ENCOUNTER — Ambulatory Visit (HOSPITAL_COMMUNITY)
Admission: AD | Admit: 2021-01-18 | Discharge: 2021-01-18 | Disposition: A | Discharge status: Home / Self Care | Payer: Medicaid Other | Attending: Psychiatry | Admitting: Psychiatry

## 2021-01-18 ENCOUNTER — Emergency Department (HOSPITAL_COMMUNITY)
Admission: EM | Admit: 2021-01-18 | Discharge: 2021-01-18 | Disposition: A | Discharge status: Home / Self Care | Payer: Medicaid Other | Attending: Emergency Medicine | Admitting: Emergency Medicine

## 2021-01-18 DIAGNOSIS — F17210 Nicotine dependence, cigarettes, uncomplicated: Secondary | ICD-10-CM | POA: Diagnosis not present

## 2021-01-18 DIAGNOSIS — F200 Paranoid schizophrenia: Secondary | ICD-10-CM

## 2021-01-18 DIAGNOSIS — Z8659 Personal history of other mental and behavioral disorders: Secondary | ICD-10-CM

## 2021-01-18 DIAGNOSIS — F22 Delusional disorders: Secondary | ICD-10-CM

## 2021-01-18 LAB — COMPREHENSIVE METABOLIC PANEL
ALT: 15 U/L (ref 0–44)
AST: 21 U/L (ref 15–41)
Albumin: 4.1 g/dL (ref 3.5–5.0)
Alkaline Phosphatase: 42 U/L (ref 38–126)
Anion gap: 9 (ref 5–15)
BUN: 24 mg/dL — ABNORMAL HIGH (ref 6–20)
CO2: 24 mmol/L (ref 22–32)
Calcium: 8.8 mg/dL — ABNORMAL LOW (ref 8.9–10.3)
Chloride: 103 mmol/L (ref 98–111)
Creatinine, Ser: 1.32 mg/dL — ABNORMAL HIGH (ref 0.61–1.24)
GFR, Estimated: >60 mL/min (ref >60)
Glucose, Bld: 88 mg/dL (ref 70–99)
Potassium: 3.9 mmol/L (ref 3.5–5.1)
Sodium: 136 mmol/L (ref 135–145)
Total Bilirubin: 0.6 mg/dL (ref 0.3–1.2)
Total Protein: 6.9 g/dL (ref 6.5–8.1)

## 2021-01-18 LAB — CBC
HCT: 41.1 % (ref 39.0–52.0)
Hemoglobin: 13.8 g/dL (ref 13.0–17.0)
MCH: 30.5 pg (ref 26.0–34.0)
MCHC: 33.6 g/dL (ref 30.0–36.0)
MCV: 90.9 fL (ref 80.0–100.0)
Platelets: 231 10*3/uL (ref 150–400)
RBC: 4.52 MIL/uL (ref 4.22–5.81)
RDW: 12.9 % (ref 11.5–15.5)
WBC: 6.9 10*3/uL (ref 4.0–10.5)
nRBC: 0 % (ref 0.0–0.2)

## 2021-01-18 LAB — RAPID URINE DRUG SCREEN, HOSP PERFORMED
Amphetamines: NOT DETECTED
Barbiturates: NOT DETECTED
Benzodiazepines: NOT DETECTED
Cocaine: NOT DETECTED
Opiates: NOT DETECTED
Tetrahydrocannabinol: NOT DETECTED

## 2021-01-18 LAB — ACETAMINOPHEN LEVEL: Acetaminophen (Tylenol), Serum: <10 ug/mL — ABNORMAL LOW (ref 10–30)

## 2021-01-18 LAB — SALICYLATE LEVEL: Salicylate Lvl: <7 mg/dL — ABNORMAL LOW (ref 7.0–30.0)

## 2021-01-18 LAB — ETHANOL: Alcohol, Ethyl (B): <10 mg/dL (ref <10)

## 2021-01-18 MED ORDER — PALIPERIDONE ER 6 MG PO TB24
9.0000 mg | ORAL_TABLET | Freq: Once | ORAL | Status: AC
  Administered 2021-01-18: 9 mg via ORAL
  Filled 2021-01-18: qty 1

## 2021-01-18 MED ORDER — LORAZEPAM 1 MG PO TABS
1.0000 mg | ORAL_TABLET | Freq: Once | ORAL | Status: AC
  Administered 2021-01-18: 1 mg via ORAL
  Filled 2021-01-18: qty 1

## 2021-01-18 NOTE — ED Notes (Signed)
Pt. In burgundy scrubs. Pt. wanded by security. Pt. Has 1 belongings bag. Pt. Has 1 cell phone, 1 black cell phone charger, 1 pr. Orange flip flops, 1 blue jean pant, 1 white t-shirt and 1 red sweater and $18.00 dollars cash. Pt. Has no wallet or credit cards. Pt. Belongings locked up at the nurses station between RES A and B and 16-18.

## 2021-01-18 NOTE — H&P (Addendum)
Behavioral Health Medical Screening Exam  [name] is a 30 y.o. male with a past psychiatric history significant for schizophrenia who presents to [name] with a chief complaint of "I'm scared for my life." Patient reports that he feels scared but doesn't know why and is unable to attribute a specific reason for being scared. Patient answers most questions during the encounter with "I don't know." Patient recently had a CCA performed by Ralph Dowdy while he was at [facility] today at around 0300. While at [facility], patient reported that he felt some was trying to kill him but patient denies a plan to kill himself, homicidal ideations, auditory or visual hallucinations. Per chart review, patient was recently seen at both [facility] and [facility] on 4/10. Patient also has several other recent admissions at [facility], [facility], [name], and [facility].  Patient denies suicidal ideations stating, "I don't want to end my life." Patient also denies homicidal ideations. Patient endorses auditory hallucinations but is unable to determine what the voices are saying and describes his auditory hallucinations as background noise. Patient denies tobacco use, alcohol consumption, and illicit drug use. During the encounter, patient stated "I'm just lost, man. I don't know, I don't know." Patient reports that he is homeless and currently living on the street. Patient states that he currently doesn't need anybody. Patient denies taking any medications/ Per chart review, patient was administered [place] 156 mg injection on Sunday (01/16/2021). Patient is unable to contract for his safety at this time.   Total Time spent with patient: 15 minutes  Psychiatric Specialty Exam:  Presentation  General Appearance: Appropriate for Environment; Casual  Eye Contact:Good  Speech:Clear and Coherent; Normal Rate  Speech Volume:Normal  Handedness:Right   Mood and Affect  Mood:Anxious  Affect:Appropriate;  Congruent   Thought Process  Thought Processes:Coherent  Descriptions of Associations:Intact  Orientation:Full (Time, Place and Person)  Thought Content:WDL  History of Schizophrenia/Schizoaffective disorder:Yes  Duration of Psychotic Symptoms:Less than six months  Hallucinations:Hallucinations: Auditory Description of Auditory Hallucinations: Patient endorses auditory hallucinations but is unable to make out the voices  Ideas of Reference:None  Suicidal Thoughts:Suicidal Thoughts: No  Homicidal Thoughts:Homicidal Thoughts: No   Sensorium  Memory:Immediate Good; Recent Good; Remote Good  Judgment:Fair  Insight:Lacking   Executive Functions  Concentration:Good  Attention Span:Good  Recall:Good  Fund of Knowledge:Fair  Language:Good   Psychomotor Activity  Psychomotor Activity:Psychomotor Activity: Normal   Assets  Assets:Communication Skills; Desire for Improvement; Financial Resources/Insurance   Sleep  Sleep:Sleep: Fair    Physical Exam: Physical Exam Constitutional:      Appearance: Normal appearance.  HENT:     Head: Normocephalic and atraumatic.     Nose: Nose normal.  Cardiovascular:     Rate and Rhythm: Normal rate.  Pulmonary:     Effort: Pulmonary effort is normal.  Musculoskeletal:        General: Normal range of motion.     Cervical back: Normal range of motion and neck supple.  Neurological:     General: No focal deficit present.     Mental Status: He is alert and oriented to person, place, and time.  Psychiatric:        Attention and Perception: Attention normal. He perceives auditory hallucinations. He does not perceive visual hallucinations.        Mood and Affect: Affect normal. Mood is anxious.        Speech: Speech normal.        Behavior: Behavior normal. Behavior is cooperative.  Thought Content: Thought content normal. Thought content does not include homicidal or suicidal ideation. Thought content does not  include suicidal plan.        Cognition and Memory: Cognition and memory normal.        Judgment: Judgment normal.    Review of Systems  Constitutional: Negative.   HENT: Negative.   Eyes: Negative.   Respiratory: Negative.   Cardiovascular: Negative.   Gastrointestinal: Negative.   Genitourinary: Negative.   Musculoskeletal: Negative.   Skin: Negative.   Neurological: Negative.   Psychiatric/Behavioral: Positive for hallucinations. Negative for depression, substance abuse and suicidal ideas. The patient is nervous/anxious. The patient does not have insomnia.    Blood pressure 121/85, pulse 80, temperature 98.2 F (36.8 C), temperature source Oral, resp. rate 20, SpO2 99 %. There is no height or weight on file to calculate BMI.  Musculoskeletal: Strength & Muscle Tone: within normal limits Gait & Station: normal Patient leans: N/A   Recommendations:  Based on my evaluation the patient does not appear to have an emergency medical condition. Patient denies suicidal ideations and homicidal ideations but does endorse auditory hallucinations. Patient reports that he is unable to contract for his safety. Patient to be transferred to [facility] for reevaluation and observation. Patient to be set up with safe transport to be delivered to [facility].  Meta Hatchet, PA 01/18/2021, 6:19 AM

## 2021-01-18 NOTE — ED Provider Notes (Signed)
Behavioral Health Urgent Care Medical Screening Exam  Patient Name: [name] MRN: [id number] Date of Evaluation: 01/18/21 Chief Complaint:   Diagnosis:  Final diagnoses:  Schizophrenia, paranoid (HCC)    History of Present illness: [name] is a 30 y.o. male with a history of schizophrenia who presents to [facility] requesting help "for my mental health." Patient is well known to behavioral health services and area emergency departments. He has presented to [facility] or the EDs 19 times time over the last 6 months with similar presentation. Patient presented to [facility] on 01/16/2021 and given Invega Sustenna 156 mg IM. Patient presented to [facility] on 01/18/2021 and was given [facility] [employer] ER 9 mg oral x 1 dose. Patient has an apartment but has refused to stay there for unknown reasons.  At this time patient does not meet inpatient psychiatric treatment criteria and is psychiatrically cleared.  Patient is instructed to continue his current home medications and follow-up with his ACT team.    On evaluation patient is alert and oriented x 4, pleasant, and cooperative. Speech is clear and coherent. Mood is depressed and affect is congruent with mood. Thought process is coherent and thought content is logical. Denies auditory and visual  hallucinations. No indication that patient is responding to internal stimuli. When asked about paranoia, he states "kind of, yes, but not really." Denies suicidal ideations. Denies homicidal ideations.   Notified legal guardian [name] at [phone] that patient is being discharged. She states that she will pick him up from [facility].    Psychiatric Specialty Exam  Presentation  General Appearance:Appropriate for Environment; Casual  Eye Contact:Good  Speech:Clear and Coherent; Normal Rate  Speech Volume:Normal  Handedness:Right   Mood and Affect  Mood:Anxious  Affect:Appropriate; Congruent   Thought Process  Thought Processes:Coherent  Descriptions of  Associations:Intact  Orientation:Full (Time, Place and Person)  Thought Content:Paranoid Ideation  Diagnosis of Schizophrenia or Schizoaffective disorder in past: Yes  Duration of Psychotic Symptoms: Greater than six months  Hallucinations:None Patient endorses auditory hallucinations but is unable to make out the voices  Ideas of Reference:Paranoia  Suicidal Thoughts:No  Homicidal Thoughts:No   Sensorium  Memory:Immediate Fair; Recent Fair; Remote Fair  Judgment:Fair  Insight:Fair   Executive Functions  Concentration:Good  Attention Span:Good  Recall:Fair  Fund of Knowledge:Fair  Language:Good   Psychomotor Activity  Psychomotor Activity:Normal   Assets  Assets:Communication Skills; Desire for Improvement; Financial Resources/Insurance; Physical Health; Transportation; Housing; Social Support   Sleep  Sleep:Fair  Number of hours: 2   No data recorded  Physical Exam: Physical Exam Constitutional:      General: He is not in acute distress.    Appearance: He is not ill-appearing, toxic-appearing or diaphoretic.  HENT:     Head: Normocephalic.     Right Ear: External ear normal.     Left Ear: External ear normal.  Eyes:     Pupils: Pupils are equal, round, and reactive to light.  Cardiovascular:     Rate and Rhythm: Normal rate.  Pulmonary:     Effort: Pulmonary effort is normal. No respiratory distress.  Musculoskeletal:        General: Normal range of motion.  Skin:    General: Skin is warm and dry.  Neurological:     Mental Status: He is alert and oriented to person, place, and time.  Psychiatric:        Mood and Affect: Mood is anxious.        Speech: Speech normal.  Behavior: Behavior is cooperative.        Thought Content: Thought content is paranoid. Thought content is not delusional. Thought content does not include homicidal or suicidal ideation. Thought content does not include suicidal plan.    Review of Systems   Constitutional: Negative for chills, diaphoresis, fever, malaise/fatigue and weight loss.  HENT: Negative for congestion.   Respiratory: Negative for cough and shortness of breath.   Cardiovascular: Negative for chest pain and palpitations.  Gastrointestinal: Negative for diarrhea, nausea and vomiting.  Neurological: Negative for dizziness and seizures.  Psychiatric/Behavioral: Negative for depression, hallucinations, memory loss, substance abuse and suicidal ideas. The patient is nervous/anxious and has insomnia.   All other systems reviewed and are negative.  Blood pressure 121/82, pulse 99, temperature (!) 96.9 F (36.1 C), temperature source Oral, resp. rate 16, SpO2 98 %. There is no height or weight on file to calculate BMI.  Musculoskeletal: Strength & Muscle Tone: within normal limits Gait & Station: normal Patient leans: N/A   [facility] MSE Discharge Disposition for Follow up and Recommendations: Based on my evaluation the patient does not appear to have an emergency medical condition and can be discharged with resources and follow up care in outpatient services for Medication Management and Individual Therapy   Follow up with ACT Team   Jackelyn Poling, NP 01/18/2021, 11:54 PM

## 2021-01-18 NOTE — ED Notes (Signed)
Hospital provided burgundy scrubs found on floor. Pt took them off after changing.

## 2021-01-18 NOTE — ED Provider Notes (Signed)
Behavioral Health Urgent Care Medical Screening Exam  Patient Name: [name] MRN: [id number] Date of Evaluation: 01/18/21 Chief Complaint:   Diagnosis:  Final diagnoses:  Schizophrenia, paranoid type (HCC)    History of Present illness: [name] is a 30 y.o. male.  Patient presents voluntarily to the [facility] after being transferred from the emergency department after being medically cleared.  Patient has continued follow-up with her family's life and does not know why.  Patient has a history of paranoid schizophrenia.  Patient denies any suicidal or homicidal ideations and denies any hallucinations.  Patient is current with [facility] and received his Invega Sustenna 156 mg IM injection on 01/16/2021.  Patient continues only reporting feeling paranoid at times.  Patient reports that he is not sure if he still has an apartment or not and his ACT team was contacted and I spoke to Tera.  She reports that she will have someone from her ACT and pick him up and take him to his appointment.  They state they have no safety concerns with the patient discharging at this time.  Upon chart review patient has presented to the ED or to the [facility] 18 times in the last 6 months for very similar presentations.  The ACT team states that they are working with the patient to keep him in his apartment tomorrow.  Patient has refused to stay there for unknown reasons.  At this time patient does not meet inpatient psychiatric treatment criteria and is psychiatrically cleared.  Patient is instructed to continue his current home medications and follow-up with his ACT team.  Attempted to contact patient's reported legal guardian Laverne look at [phone] but was unsuccessful.  Psychiatric Specialty Exam  Presentation  General Appearance:Appropriate for Environment; Casual  Eye Contact:Good  Speech:Clear and Coherent; Normal Rate  Speech Volume:Normal  Handedness:Right   Mood and Affect   Mood:Anxious  Affect:Appropriate; Congruent   Thought Process  Thought Processes:Coherent  Descriptions of Associations:Intact  Orientation:Full (Time, Place and Person)  Thought Content:Paranoid Ideation  Diagnosis of Schizophrenia or Schizoaffective disorder in past: Yes  Duration of Psychotic Symptoms: Greater than six months  Hallucinations:None Patient endorses auditory hallucinations but is unable to make out the voices  Ideas of Reference:Paranoia  Suicidal Thoughts:No  Homicidal Thoughts:No   Sensorium  Memory:Immediate Fair; Recent Fair; Remote Fair  Judgment:Fair  Insight:Fair   Executive Functions  Concentration:Good  Attention Span:Good  Recall:Good  Fund of Knowledge:Fair  Language:Good   Psychomotor Activity  Psychomotor Activity:Normal   Assets  Assets:Communication Skills; Desire for Improvement; Financial Resources/Insurance; Location manager; Social Support; Physical Health   Sleep  Sleep:Fair  Number of hours: 2   No data recorded  Physical Exam: Physical Exam Vitals and nursing note reviewed.  Constitutional:      Appearance: He is well-developed.  HENT:     Head: Normocephalic.  Eyes:     Pupils: Pupils are equal, round, and reactive to light.  Cardiovascular:     Rate and Rhythm: Normal rate.  Pulmonary:     Effort: Pulmonary effort is normal.  Musculoskeletal:        General: Normal range of motion.  Neurological:     Mental Status: He is alert and oriented to person, place, and time.    Review of Systems  Constitutional: Negative.   HENT: Negative.   Eyes: Negative.   Respiratory: Negative.   Cardiovascular: Negative.   Gastrointestinal: Negative.   Genitourinary: Negative.   Musculoskeletal: Negative.   Skin: Negative.  Neurological: Negative.   Endo/Heme/Allergies: Negative.   Psychiatric/Behavioral: Negative.    Blood pressure 137/80, pulse 62, temperature 97.8 F (36.6 C), temperature  source Oral, resp. rate 18, SpO2 100 %. There is no height or weight on file to calculate BMI.  Musculoskeletal: Strength & Muscle Tone: within normal limits Gait & Station: normal Patient leans: N/A   [facility] MSE Discharge Disposition for Follow up and Recommendations: Based on my evaluation the patient does not appear to have an emergency medical condition and can be discharged with resources and follow up care in outpatient services for Medication Management, Individual Therapy and follow up with [facility]   Gerlene Burdock Monroe Qin, FNP 01/18/2021, 7:56 AM

## 2021-01-18 NOTE — ED Notes (Signed)
Pt refusing to get dressed. Security present.

## 2021-01-18 NOTE — ED Notes (Signed)
Pt belongings returned. Discharge instructions reviewed. Pt sts understanding and denies any questions.

## 2021-01-18 NOTE — Discharge Instructions (Signed)

## 2021-01-18 NOTE — Discharge Instructions (Addendum)
You can be discharged and should follow up with outpatient services such at [facility] for further care.

## 2021-01-18 NOTE — ED Notes (Signed)
Pt. Made aware for the need of urine specimen. 

## 2021-01-18 NOTE — ED Provider Notes (Signed)
[facility]-EMERGENCY DEPT Provider Note   CSN: [id number] Arrival date & time: 01/18/21  0029     History Chief Complaint  Patient presents with  . Paranoid    [name] is a 30 y.o. male.  Patient with history of paranoid schizophrenia, noncompliance with treatment presents with thoughts stated as "I'm afraid for my life". No SI/HI.   The history is provided by the patient. No language interpreter was used.       Past Medical History:  Diagnosis Date  . Depression   . Schizophrenia [facility])     Patient Active Problem List   Diagnosis Date Noted  . 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 Schizophrenia, paranoid type (HCC) 10/24/2020  . Paranoid schizophrenia (HCC) 01/05/2020  . Other schizophrenia (HCC) 02/14/2018  . Cannabis use disorder, moderate, dependence (HCC) 05/10/2016    Past Surgical History:  Procedure Laterality Date  . BACK SURGERY         History reviewed. No pertinent family history.  Social History   Tobacco Use  . Smoking status: Current Some Day Smoker    Types: Cigarettes  . Smokeless tobacco: Never Used  Vaping Use  . Vaping Use: Never used  Substance Use Topics  . Alcohol use: No  . Drug use: Yes    Types: Marijuana    Comment: rare    Home Medications Prior to Admission medications   Medication Sig Start Date End Date Taking? Authorizing Provider  paliperidone (INVEGA) 6 MG 24 hr tablet Take 1 tablet (6 mg total) by mouth daily for 14 days. 01/02/21 01/16/21  Mare Ferrari, PA-C    Allergies    Shellfish allergy and Bee pollen  Review of Systems   Review of Systems  Constitutional: Negative for chills and fever.  HENT: Negative.   Respiratory: Negative.   Cardiovascular: Negative.   Gastrointestinal: Negative.   Musculoskeletal: Negative.   Skin: Negative.   Neurological: Negative.   Psychiatric/Behavioral: Positive for hallucinations. The patient is nervous/anxious.     Physical  Exam Updated Vital Signs BP (!) 152/93 (BP Location: Left Arm)   Pulse 96   Temp 98.3 F (36.8 C) (Oral)   Resp 17   Ht 5\' 7"  (1.702 m)   Wt 70.3 kg   SpO2 100%   BMI 24.28 kg/m   Physical Exam Constitutional:      Appearance: He is well-developed.  Pulmonary:     Effort: Pulmonary effort is normal.  Musculoskeletal:        General: Normal range of motion.     Cervical back: Normal range of motion.  Skin:    General: Skin is warm and dry.  Neurological:     Mental Status: He is alert and oriented to person, place, and time.  Psychiatric:        Mood and Affect: Mood is anxious.        Speech: Speech is rapid and pressured.        Behavior: Behavior is hyperactive. Behavior is cooperative.        Thought Content: Thought content is paranoid.     Comments: He is restless, unable to sit still, pressure/rapid speech.      ED Results / Procedures / Treatments   Labs (all labs ordered are listed, but only abnormal results are displayed) Labs Reviewed  COMPREHENSIVE METABOLIC PANEL  ETHANOL  SALICYLATE LEVEL  ACETAMINOPHEN LEVEL  CBC  RAPID URINE DRUG SCREEN, HOSP PERFORMED   Results for orders placed or  performed during the hospital encounter of 01/18/21  Comprehensive metabolic panel  Result Value Ref Range   Sodium 136 135 - 145 mmol/L   Potassium 3.9 3.5 - 5.1 mmol/L   Chloride 103 98 - 111 mmol/L   CO2 24 22 - 32 mmol/L   Glucose, Bld 88 70 - 99 mg/dL   BUN 24 (H) 6 - 20 mg/dL   Creatinine, Ser 1.24 (H) 0.61 - 1.24 mg/dL   Calcium 8.8 (L) 8.9 - 10.3 mg/dL   Total Protein 6.9 6.5 - 8.1 g/dL   Albumin 4.1 3.5 - 5.0 g/dL   AST 21 15 - 41 U/L   ALT 15 0 - 44 U/L   Alkaline Phosphatase 42 38 - 126 U/L   Total Bilirubin 0.6 0.3 - 1.2 mg/dL   GFR, Estimated >58 >09 mL/min   Anion gap 9 5 - 15  Ethanol  Result Value Ref Range   Alcohol, Ethyl (B) <10 <10 mg/dL  Salicylate level  Result Value Ref Range   Salicylate Lvl <7.0 (L) 7.0 - 30.0 mg/dL   Acetaminophen level  Result Value Ref Range   Acetaminophen (Tylenol), Serum <10 (L) 10 - 30 ug/mL  cbc  Result Value Ref Range   WBC 6.9 4.0 - 10.5 K/uL   RBC 4.52 4.22 - 5.81 MIL/uL   Hemoglobin 13.8 13.0 - 17.0 g/dL   HCT 98.3 38.2 - 50.5 %   MCV 90.9 80.0 - 100.0 fL   MCH 30.5 26.0 - 34.0 pg   MCHC 33.6 30.0 - 36.0 g/dL   RDW 39.7 67.3 - 41.9 %   Platelets 231 150 - 400 K/uL   nRBC 0.0 0.0 - 0.2 %     EKG None  Radiology No results found.  Procedures Procedures   Medications Ordered in ED Medications  LORazepam (ATIVAN) tablet 1 mg (has no administration in time range)    ED Course  I have reviewed the triage vital signs and the nursing notes.  Pertinent labs & imaging results that were available during my care of the patient were reviewed by me and considered in my medical decision making (see chart for details).    MDM Rules/Calculators/A&P                          Patient to ED with paranoia, h/o schizophrenia, reports not taking any medications. Denies SI/HI.  TTS consultation requested for stabilization. Ativan offered, patient states he will take this, but declines other medications.   He has been fully assessed by TTS and is found not to meet inpatient criteria. Will discharge home with referrals for outpatient services.   Final Clinical Impression(s) / ED Diagnoses Final diagnoses:  None   1. Paranoid schizophrenia  Rx / DC Orders ED Discharge Orders    None       Elpidio Anis, PA-C 01/18/21 0416    Molpus, Jonny Ruiz, MD 01/18/21 [phone]

## 2021-01-18 NOTE — BH Assessment (Signed)
TRIAGE: ROUTINE  [name] is a 30 year old patient who independently presented to the [facility] due to feelings that someone is trying to kill him. When clinician inquired as to why pt was at the [facility], he stated, "I don't know."  Pt denies current SI, a plan to kill himself, HI, AVH, NSSIB, access to guns/weapons, engagement with the legal system, and SA.  Throughout the assessment clinician was able to identify that pt appeared to be masturbating.  [name], PA, reviewed pt's chart and information and determined that, since pt is denying SI, HI, and AVH, he can be psych cleared with referral information for services (such as at the [facility]). This information was relayed to pt's providers, Leone Haven, Christa See, and Dr. Paula Libra, at [phone].

## 2021-01-18 NOTE — BH Assessment (Signed)
Comprehensive Clinical Assessment (CCA) Screening, Triage and Referral Note  01/18/2021 [name] [id number] Disposition: Patient was seen by this clinician and [name], PA.  Patient had his MSE performed by [name].  Patient is recommended to go to [facility].  [name] talked to [name], FNP regarding patient coming over to [facility].    Patient was assessed twice on 04/10. He received an IM Invega shot on 04/10 and was discharged to care of [facility] ACTT team. He came to [facility] on 04/11 and an order was put in at 00:59 on 04/12 for a teleassessment. Patient did not meet inpatient care criteria. Patient walked to [facility] upon d/c from [facility]. Patient continues to deny any SI or HI. When asked about hallucinations he endorses hearing voices but not being able to tell what they are saying. Patient denies visual hallucinations. He says "I am scared for my life." He cannot identify why he thinks that he should be fearful. He answers "I don't know" to many inquiries. Patient is asked about use of ETOH or other substances and he answers "I don't know.": Pt was asked by [name], PA if he felt he could contract for safety and patient says "no." Pt is currently homeless  [name] was informed about disposition.  She will contact patient transport.    Chief Complaint: No chief complaint on file.  Visit Diagnosis: Schizophrenia, paranoid type  Patient Reported Information How did you hear about [place]? Self (Pt left [facility] and walked to [facility])   Referral name: Self   Referral phone number: 0 (N/A)  Whom do you see for routine medical problems? I don't have a doctor   Practice/Facility Name: No data recorded  Practice/Facility Phone Number: No data recorded  Name of Contact: No data recorded  Contact Number: No data recorded  Contact Fax Number: No data recorded  Prescriber Name: No data recorded  Prescriber Address (if known): No data recorded What Is the Reason for Your Visit/Call Today? Patient  was assessed twice on 04/10.  He received an IM Invega shot on 04/10 and was discharged to care of [facility] ACTT team.  He came to [facility] on 04/11 and an order was put in at 00:59 on 04/12 for a teleassessment. Patient did not meet inpatient care criteria.  Patient walked to [facility] upon d/c from [facility].  Patient continues to deny any SI or HI.  When asked about hallucinations he endorses hearing voices but not being able to tell what they are saying.  Patient denies visual hallucinations.  He says "I am scared for my life."  He cannot identify why he thinks that he should be fearful.  He answers "I don't know" to many inquiries.  Patient is asked about use of ETOH or other substances and he answers "I don't know.":  Pt was asked by [name], PA if he felt he could contract for safety and patient says "no."  Pt is currently homeless.  How Long Has This Been Causing You Problems? <Week  Have You Recently Been in Any Inpatient Treatment (Hospital/Detox/Crisis Center/28-Day Program)? No   Name/Location of Program/Hospital:[facility]   How Long Were You There? Per chart, "2 days."   When Were You Discharged? 02/24/2020 (Per chart.)  Have You Ever Received Services From [facility] Before? Yes   Who Do You See at [facility]? ED 01/11/2021; TTS and ED 01/15/2021; TTS and ED 01/16/2021 and 01/17/21  Have You Recently Had Any Thoughts About Hurting Yourself? No   Are  You Planning to Commit Suicide/Harm Yourself At This time?  No  Have you Recently Had Thoughts About Hurting Someone [name]? No   Explanation: No data recorded Have You Used Any Alcohol or Drugs in the Past 24 Hours? No   How Long Ago Did You Use Drugs or Alcohol?  0000 (Unclear)   What Did You Use and How Much? Pt says "I don't know."  What Do You Feel Would Help You the Most Today? Treatment for Depression or other mood problem; Social Support  Do You Currently Have a Therapist/Psychiatrist? No (Pt reportedly has a ACTT team through  [employer].)   Name of Therapist/Psychiatrist: ACTT Team - agency name unknown   Have You Been Recently Discharged From Any Public relations account executive or Programs? No   Explanation of Discharge From Practice/Program:  No data recorded    CCA Screening Triage Referral Assessment Type of Contact: Face-to-Face   Is this Initial or Reassessment? Initial Assessment   Date Telepsych consult ordered in [facility]:  01/18/2021   Time Telepsych consult ordered in [facility]:  0321  Patient Reported Information Reviewed? Yes   Patient Left Without Being Seen? No   Reason for Not Completing Assessment: No data recorded Collateral Involvement: Per Active FYIs, pt does *not* have a guardian.  Does Patient Have a Automotive engineer Guardian? No data recorded  Name and Contact of Legal Guardian:  [name], legal guardian.   If Minor and Not Living with Parent(s), Who has Custody? N/A  Is CPS involved or ever been involved? Never  Is APS involved or ever been involved? Never  Patient Determined To Be At Risk for Harm To Self or Others Based on Review of Patient Reported Information or Presenting Complaint? No (Pt however says he cannot contract for safety.)   Method: No Plan   Availability of Means: No access or NA   Intent: Vague intent or NA   Notification Required: No need or identified person   Additional Information for Danger to Others Potential:  Active psychosis   Additional Comments for Danger to Others Potential:  History of children playing outside triggering patient.   Are There Guns or Other Weapons in Your Home?  No    Types of Guns/Weapons: No data recorded   Are These Weapons Safely Secured?                              No data recorded   Who Could Verify You Are Able To Have These Secured:    No data recorded Do You Have any Outstanding Charges, Pending Court Dates, Parole/Probation? None noted  Contacted To Inform of Risk of Harm To Self or Others: Other: Comment (N/A)  Location  of Assessment: -- ([facility])  Does Patient Present under Involuntary Commitment? No   IVC Papers Initial File Date: 10/22/2020   [place] of Residence: [place] (Homeless in [place])  Patient Currently Receiving the Following Services: ACTT (Assertive Community Treatment)   Determination of Need: Routine (7 days)   Options For Referral: Medication Management; Outpatient Therapy   Beatriz Stallion Ray, LCAS

## 2021-01-18 NOTE — ED Provider Notes (Signed)
MSE was initiated and I personally evaluated the patient and placed orders (if any) at  12:55 AM on January 18, 2021.  Patient with history of schizophrenia, noncompliance with treatment, presents with paranoia, "I'm afraid for my life". He cannot give further detail. He does not have any SI/HI. "I need some medication for my mind".   Today's Vitals   01/18/21 0036  BP: (!) 152/93  Pulse: 96  Resp: 17  Temp: 98.3 F (36.8 C)  TempSrc: Oral  SpO2: 100%  Weight: 70.3 kg  Height: 5\' 7"  (1.702 m)  PainSc: 0-No pain   Body mass index is 24.28 kg/m.  Paranoid affect Rapid, pressured speech Cooperative  The patient appears stable so that the remainder of the MSE may be completed by another provider.   , PA-C 01/18/21 0057    [place], 03/20/21, MD 01/18/21 [phone]

## 2021-01-18 NOTE — ED Triage Notes (Signed)
Patient states that he feels like someone is out to kill him.

## 2021-01-18 NOTE — Discharge Instructions (Addendum)

## 2021-01-18 NOTE — ED Notes (Addendum)
Pt discharged accompanied by ACTT. Verbalized understanding of instructions on AVS. All belongings returned from locker intact. Safety maintained.

## 2021-01-19 NOTE — ED Notes (Addendum)
Pt discharged in no acute distress. A&O x4, ambulatory. Denied SI/HI/AVH. Verbalized understanding of AVS instructions reviewed by RN. Belongings returned to pt intact from "red" locker. Pt escorted to lobby by staff. Legal guardian to pick pt up. Safety maintained.

## 2021-01-19 NOTE — BH Assessment (Addendum)
TRIAGE - ROUTINE  [name] is a 30 year old patient who voluntarily [facility]), stating, "I'm scared for my life." Pt denies SI, a plan to kill himself, HI, AVH, NSSIB, access to guns/weapons, engagement with the legal system, or SA. When asked a second time about pt's SA, he again denied he uses any substances.  [name], NP, reviewed pt's chart and information and determined pt can be psych cleared. Pt's aunt/payee/possibly his legal guardian (pt's chart has differing information re: whether pt has a legal guardian or not) was contacted to request pt is picked up from the [facility].

## 2021-01-22 ENCOUNTER — Other Ambulatory Visit: Payer: Self-pay

## 2021-01-22 ENCOUNTER — Encounter (HOSPITAL_COMMUNITY): Payer: Self-pay

## 2021-01-22 ENCOUNTER — Emergency Department (HOSPITAL_COMMUNITY)
Admission: EM | Admit: 2021-01-22 | Discharge: 2021-01-22 | Disposition: A | Discharge status: Home / Self Care | Payer: Medicaid Other | Attending: Emergency Medicine | Admitting: Emergency Medicine

## 2021-01-22 DIAGNOSIS — F329 Major depressive disorder, single episode, unspecified: Secondary | ICD-10-CM | POA: Diagnosis not present

## 2021-01-22 DIAGNOSIS — F1229 Cannabis dependence with unspecified cannabis-induced disorder: Secondary | ICD-10-CM | POA: Insufficient documentation

## 2021-01-22 DIAGNOSIS — F200 Paranoid schizophrenia: Secondary | ICD-10-CM | POA: Insufficient documentation

## 2021-01-22 DIAGNOSIS — F1999 Other psychoactive substance use, unspecified with unspecified psychoactive substance-induced disorder: Secondary | ICD-10-CM | POA: Diagnosis present

## 2021-01-22 DIAGNOSIS — Z1339 Encounter for screening examination for other mental health and behavioral disorders: Secondary | ICD-10-CM | POA: Diagnosis present

## 2021-01-22 DIAGNOSIS — F17210 Nicotine dependence, cigarettes, uncomplicated: Secondary | ICD-10-CM | POA: Insufficient documentation

## 2021-01-22 DIAGNOSIS — F1220 Cannabis dependence, uncomplicated: Secondary | ICD-10-CM | POA: Diagnosis present

## 2021-01-22 DIAGNOSIS — Z008 Encounter for other general examination: Secondary | ICD-10-CM

## 2021-01-22 MED ORDER — HYDROXYZINE HCL 25 MG PO TABS
25.0000 mg | ORAL_TABLET | Freq: Three times a day (TID) | ORAL | Status: DC | PRN
  Administered 2021-01-22: 25 mg via ORAL
  Filled 2021-01-22: qty 1

## 2021-01-22 MED ORDER — HYDROXYZINE HCL 25 MG PO TABS
50.0000 mg | ORAL_TABLET | Freq: Once | ORAL | Status: AC
  Administered 2021-01-22: 50 mg via ORAL
  Filled 2021-01-22: qty 2

## 2021-01-22 MED ORDER — PALIPERIDONE ER 6 MG PO TB24
6.0000 mg | ORAL_TABLET | Freq: Every day | ORAL | Status: AC
  Administered 2021-01-22: 6 mg via ORAL
  Filled 2021-01-22: qty 1

## 2021-01-22 NOTE — Progress Notes (Signed)
CSW spoke with Ms. [name], Patients aunt and payee. Ms. [name] stated patient does not have a legal guardian. Ms. [name] stated that patient lives with her but has a 2 bedroom 2 bathroom apartment in [place] [facility] [place]. Ms. [name] stated patient does not like staying there because he is afraid and does not know anymore. Ms. [name] stated that patient likes to be in [place]. Ms. [name] stated patient stated he needed to go to the hospital because he was wheezing from eating crab legs. Ms. [name] stated patient refuses to take his medication even when she tells him too. Ms. [name] stated that he has an ACTT Team and a care coordinator at [facility] but they will not speak to her because she isn't the legal guardian. Ms. [name] stated patient will also say he needs to go to the hospital to get his paranoia together. Ms. [name] stated patient is well taken care of and has food, clothing, and shelter. Ms. [name] stated she has takes him to get his haircut every 2 weeks. Ms. [name] stated she wants patient with her but her car is broke down. Ms. [name] stated he can come to her address at [address] [place] [address]. [employer].

## 2021-01-22 NOTE — Consult Note (Signed)
Telepsych Consultation   Reason for Consult:  Unstable psych state Referring Physician:  Gerlene Fee, MD Location of Patient: [place] [facility] Location of Provider: [place] Department  Patient Identification: [name] MRN:  [id number] Principal Diagnosis: Substance-induced disorder [facility]) Diagnosis:  Principal Problem:   Substance-induced disorder ([place]) Active Problems:   Cannabis use disorder, moderate, dependence ([place])   Schizophrenia, paranoid type ([place])  Total Time spent with patient: 20 minutes  Subjective:   [name] is a 30 y.o. male patient admitted with paranoid thoughts, possibly substance induced.  Patient presents calm and cooperative; sitting on side of the bed eating breakfast. Patient states he has been smoking weed and felt like people were after him. Denies any suicidal, homicidal, or paranoid thoughts at this time. Patient denies any auditory or visual hallucinations and does not appear to be responding to any external/internal stimuli at this time.   Collateral:  [name] Team Lead: Arbie Cookey [phone] Patient currently has open APS case open with [facility]; met with patient Thursday regarding possible issues with aunt/payee. Patient is his own guardian at this time. Patient continues to refuse PO medications. Provider discussed patient presentation and not meeting inpatient criteria; Arbie Cookey agrees patient is currently at baseline, voiced no acute safety concerns at this time. Plan to discharge patient to follow up with ACTT team.   HPI:   [name] is a 30 year old male well known to [place] who presented to [facility] with paranoia stating he feared for his life. This is [name]'s 4'th ED encounter this week, 7th this month. Patient received his IM Kirt Boys injection 01/16/21 after presenting to [facility] following discharge from [facility]. He is currently being followed by [employer] Team and his current payee is his aunt [name].   Past  Psychiatric History:   -substance induced disorder  -cannabis use disorder, moderate, dependence  -schizophrenia, paranoid type  Risk to Self:  pt denies Risk to Others:  pt denies Prior Inpatient Therapy:  yes Prior Outpatient Therapy:  yes  Past Medical History:  Past Medical History:  Diagnosis Date  . Depression   . Schizophrenia [facility])     Past Surgical History:  Procedure Laterality Date  . BACK SURGERY     Family History: No family history on file. Family Psychiatric  History: not noted Social History:  Social History   Substance and Sexual Activity  Alcohol Use No     Social History   Substance and Sexual Activity  Drug Use Yes  . Types: Marijuana   Comment: rare    Social History   Socioeconomic History  . Marital status: Single    Spouse name: Not on file  . Number of children: Not on file  . Years of education: Not on file  . Highest education level: Not on file  Occupational History  . Occupation: unemployed  Tobacco Use  . Smoking status: Current Some Day Smoker    Types: Cigarettes  . Smokeless tobacco: Never Used  Vaping Use  . Vaping Use: Never used  Substance and Sexual Activity  . Alcohol use: No  . Drug use: Yes    Types: Marijuana    Comment: rare  . Sexual activity: Yes    Birth control/protection: Condom  Other Topics Concern  . Not on file  Social History Narrative   ** Merged History Encounter **       UTA certain topics - tangential thought process, cannot answer clearly. Pt not sure about school;  reportedly is homeless.    Social Determinants of Health   Financial Resource Strain: Not on file  Food Insecurity: Not on file  Transportation Needs: Not on file  Physical Activity: Not on file  Stress: Not on file  Social Connections: Not on file   Additional Social History:  -[name] Team  Allergies:   Allergies  Allergen Reactions  . Shellfish Allergy Hives and Itching    seafood  . Bee Pollen Itching     Labs: No results found for this or any previous visit (from the past 48 hour(s)).  Medications:  Current Facility-Administered Medications  Medication Dose Route Frequency Provider Last Rate Last Admin  . hydrOXYzine (ATARAX/VISTARIL) tablet 25 mg  25 mg Oral TID PRN Leevy-Johnson, Linette Gunderson A, NP       Current Outpatient Medications  Medication Sig Dispense Refill  . paliperidone (INVEGA) 6 MG 24 hr tablet Take 1 tablet (6 mg total) by mouth daily for 14 days. (Patient not taking: Reported on 01/22/2021) 14 tablet 0    Musculoskeletal: Strength & Muscle Tone: within normal limits Gait & Station: normal Patient leans: N/A  Psychiatric Specialty Exam: Physical Exam  Review of Systems  Blood pressure 115/88, pulse 95, temperature 97.7 F (36.5 C), temperature source Oral, resp. rate 18, SpO2 97 %.There is no height or weight on file to calculate BMI.  General Appearance: Casual  Eye Contact:  Fair  Speech:  Clear and Coherent  Volume:  Normal  Mood:  Euthymic  Affect:  Blunt and Congruent  Thought Process:  Goal Directed  Orientation:  Full (Time, Place, and Person)  Thought Content:  Logical  Suicidal Thoughts:  No  Homicidal Thoughts:  No  Memory:  Immediate;   Fair Recent;   Fair  Judgement:  Poor  Insight:  Present and Shallow  Psychomotor Activity:  Normal  Concentration:  Concentration: Fair and Attention Span: Fair  Recall:  [employer] of Knowledge:  Poor  Language:  Fair  Akathisia:  NA  Handed:    AIMS (if indicated):     Assets:  Agricultural consultant Housing Leisure Time Physical Health Resilience Social Support  ADL's:  Intact  Cognition:  WNL  Sleep:      Treatment Plan Summary: Plan discharge patient for follow-up with ACTT Team for further management.   -Patient received IM Kirt Boys 128m 01/16/21 by this   provider   -Patient received Paliperidone 619mPO 01/22/21 while in ED  -[facility]  [facility]   Disposition: No evidence of imminent risk to self or others at present.   Patient does not meet criteria for psychiatric inpatient admission. Supportive therapy provided about ongoing stressors. Discussed crisis plan, support from social network, calling 911, coming to the Emergency Department, and calling Suicide Hotline.  This service was provided via telemedicine using a 2-way, interactive audio and video technology.  Names of all persons participating in this telemedicine service and their role in this encounter. Name: BrOneida Alarole: PMHNP  Name: GrSharma Covertole: Attending MD  Name: MaDoran Heaterole: patient  Name: CaArbie Cookeyole: MoLurayeam Lead    BrInda MerlinNP 01/22/2021 11:26 AM

## 2021-01-22 NOTE — Discharge Instructions (Addendum)
Follow-up with your act team.

## 2021-01-22 NOTE — ED Triage Notes (Signed)
Pt called EMS stating he feared for his life. Pt denies auditory/visual hallucination. He is anxious on assessment

## 2021-01-22 NOTE — ED Notes (Signed)
wanded by security 

## 2021-01-22 NOTE — ED Notes (Signed)
Sandwich and ginger ale provided.

## 2021-01-22 NOTE — ED Notes (Signed)
Pt currently endorses marijuana use.

## 2021-01-22 NOTE — ED Provider Notes (Addendum)
[facility] Emergency Department Provider Note MRN:  [id number]  Arrival date & time: 01/22/21     Chief Complaint   Medical Clearance   History of Present Illness   [name] is a 30 y.o. year-old male with a history of schizophrenia presenting to the ED with chief complaint of medical clearance.  Patient here because he fears for his life.  He otherwise does not want to talk about it.  Denies wanting to harm himself or others.  He seems fearful.  He has not been taking his medications.  Review of Systems  A complete 10 system review of systems was obtained and all systems are negative except as noted in the HPI and PMH.   Patient's Health History    Past Medical History:  Diagnosis Date  . Depression   . Schizophrenia [facility])     Past Surgical History:  Procedure Laterality Date  . BACK SURGERY      No family history on file.  Social History   Socioeconomic History  . Marital status: Single    Spouse name: Not on file  . Number of children: Not on file  . Years of education: Not on file  . Highest education level: Not on file  Occupational History  . Occupation: unemployed  Tobacco Use  . Smoking status: Current Some Day Smoker    Types: Cigarettes  . Smokeless tobacco: Never Used  Vaping Use  . Vaping Use: Never used  Substance and Sexual Activity  . Alcohol use: No  . Drug use: Yes    Types: Marijuana    Comment: rare  . Sexual activity: Yes    Birth control/protection: Condom  Other Topics Concern  . Not on file  Social History Narrative   ** Merged History Encounter **       UTA certain topics - tangential thought process, cannot answer clearly. Pt not sure about school; reportedly is homeless.    Social Determinants of Health   Financial Resource Strain: Not on file  Food Insecurity: Not on file  Transportation Needs: Not on file  Physical Activity: Not on file  Stress: Not on file  Social Connections: Not on file   Intimate Partner Violence: Not on file     Physical Exam   Vitals:   01/22/21 0252 01/22/21 0609  BP: (!) 138/102 131/85  Pulse: 83 86  Resp: 20 17  Temp: 97.7 F (36.5 C)   SpO2: 97% 95%    CONSTITUTIONAL: Well-appearing, NAD NEURO:  Alert and oriented x 3, no focal deficits EYES:  eyes equal and reactive ENT/NECK:  no LAD, no JVD CARDIO: Regular rate, well-perfused, normal S1 and S2 PULM:  CTAB no wheezing or rhonchi GI/GU:  normal bowel sounds, non-distended, non-tender MSK/SPINE:  No gross deformities, no edema SKIN:  no rash, atraumatic PSYCH: Withdrawn speech and behavior  *Additional and/or pertinent findings included in MDM below  Diagnostic and Interventional Summary    EKG Interpretation  Date/Time:    Ventricular Rate:    PR Interval:    QRS Duration:   QT Interval:    QTC Calculation:   R Axis:     Text Interpretation:        Labs Reviewed - No data to display  No orders to display    Medications  paliperidone (INVEGA) 24 hr tablet 6 mg (has no administration in time range)  hydrOXYzine (ATARAX/VISTARIL) tablet 50 mg (50 mg Oral Given 01/22/21 0421)     Procedures  /  Critical Care Procedures  ED Course and Medical Decision Making  I have reviewed the triage vital signs, the nursing notes, and pertinent available records from the EMR.  Listed above are laboratory and imaging tests that I personally ordered, reviewed, and interpreted and then considered in my medical decision making (see below for details).  Similar ED visit to prior, patient is either unwilling or unable to explain how he is feeling.  He is very withdrawn.  He is not taking his medications.  He is requesting a "chill pill".  I have ordered his home antipsychotic.  We will have TTS reevaluate.  Signed out to default provider.  Labs normal only 3 days ago, I see no benefit of repeating.  Medically cleared.    Update 7:20 AM: Patient is now refusing home meds.  Psychiatry may want  to consider long-acting intramuscular options.   Elmer Sow. Pilar Plate, MD [facility] Emergency [facility] [email or web address]  Final Clinical Impressions(s) / ED Diagnoses     ICD-10-CM   1. Encounter for psychological evaluation  Z00.8     ED Discharge Orders    None       Discharge Instructions Discussed with and Provided to Patient:   Discharge Instructions   None       Sabas Sous, MD 01/22/21 1308    Sabas Sous, MD 01/22/21 [phone]

## 2021-01-22 NOTE — ED Notes (Signed)
Pt refused paliperidone stating "it's not right". RN asked pt if he could name the correct medication. He responded the "chill pill". RN attempted to explain that this medication was what the provider had deemed best. Pt continued to refuse. MD notified of refusal.

## 2021-01-24 ENCOUNTER — Other Ambulatory Visit: Payer: Self-pay

## 2021-01-24 ENCOUNTER — Ambulatory Visit (HOSPITAL_COMMUNITY)
Admission: AD | Admit: 2021-01-24 | Discharge: 2021-01-24 | Disposition: A | Discharge status: Home / Self Care | Payer: Medicaid Other | Attending: Psychiatry | Admitting: Psychiatry

## 2021-01-24 ENCOUNTER — Encounter (HOSPITAL_COMMUNITY): Payer: Self-pay

## 2021-01-24 ENCOUNTER — Emergency Department (HOSPITAL_COMMUNITY): Payer: Medicaid Other

## 2021-01-24 ENCOUNTER — Emergency Department (HOSPITAL_COMMUNITY)
Admission: EM | Admit: 2021-01-24 | Discharge: 2021-01-24 | Disposition: A | Discharge status: Home / Self Care | Payer: Medicaid Other | Attending: Emergency Medicine | Admitting: Emergency Medicine

## 2021-01-24 DIAGNOSIS — R062 Wheezing: Secondary | ICD-10-CM | POA: Diagnosis not present

## 2021-01-24 DIAGNOSIS — F17200 Nicotine dependence, unspecified, uncomplicated: Secondary | ICD-10-CM | POA: Diagnosis not present

## 2021-01-24 DIAGNOSIS — R0602 Shortness of breath: Secondary | ICD-10-CM | POA: Diagnosis present

## 2021-01-24 MED ORDER — ALBUTEROL SULFATE HFA 108 (90 BASE) MCG/ACT IN AERS
4.0000 | INHALATION_SPRAY | Freq: Once | RESPIRATORY_TRACT | Status: AC
  Administered 2021-01-24: 4 via RESPIRATORY_TRACT
  Filled 2021-01-24: qty 6.7

## 2021-01-24 NOTE — ED Provider Notes (Signed)
[facility]-EMERGENCY DEPT Provider Note   CSN: [id number] Arrival date & time: 01/24/21  0052     History Chief Complaint  Patient presents with  . Shortness of Breath    [name] is a 30 y.o. male with a hx of schizophrenia who presents to the ED with initial complaint of dyspnea and also with request for medication for his mind. Patient reports dyspnea for a few days, no alleviating/aggravating factors. Denies fever, chills, cough, chest pain, or leg swelling.  HPI     Past Medical History:  Diagnosis Date  . Schizophrenia (HCC)     There are no problems to display for this patient.   History reviewed. No pertinent surgical history.     History reviewed. No pertinent family history.  Social History   Tobacco Use  . Smoking status: Current Some Day Smoker    Home Medications Prior to Admission medications   Not on File    Allergies    Patient has no allergy information on record.  Review of Systems   Review of Systems  Constitutional: Negative for fever.  Respiratory: Positive for shortness of breath. Negative for cough.   Cardiovascular: Negative for chest pain and leg swelling.  Gastrointestinal: Negative for abdominal pain and vomiting.  Psychiatric/Behavioral: Negative for confusion, hallucinations, self-injury and suicidal ideas. The patient is not nervous/anxious.   All other systems reviewed and are negative.   Physical Exam Updated Vital Signs BP 103/74   Pulse 84   Temp 97.7 F (36.5 C) (Oral)   Resp 16   Ht 5\' 5"  (1.651 m)   Wt 72.6 kg   SpO2 94%   BMI 26.63 kg/m   Physical Exam Vitals and nursing note reviewed.  Constitutional:      General: He is not in acute distress.    Appearance: He is well-developed. He is not toxic-appearing.  HENT:     Head: Normocephalic and atraumatic.  Eyes:     General:        Right eye: No discharge.        Left eye: No discharge.     Conjunctiva/sclera: Conjunctivae  normal.  Cardiovascular:     Rate and Rhythm: Normal rate and regular rhythm.  Pulmonary:     Effort: Pulmonary effort is normal. No respiratory distress.     Breath sounds: Wheezing (mild expiratory) present. No rhonchi or rales.  Abdominal:     General: There is no distension.     Palpations: Abdomen is soft.     Tenderness: There is no abdominal tenderness.  Musculoskeletal:     Cervical back: Neck supple.     Right lower leg: No tenderness. No edema.     Left lower leg: No tenderness. No edema.  Skin:    General: Skin is warm and dry.     Findings: No rash.  Neurological:     Mental Status: He is alert.     Comments: Clear speech.   Psychiatric:        Attention and Perception: He does not perceive auditory or visual hallucinations.        Behavior: Behavior normal.        Thought Content: Thought content does not include homicidal or suicidal ideation.     ED Results / Procedures / Treatments   Labs (all labs ordered are listed, but only abnormal results are displayed) Labs Reviewed - No data to display  EKG None  Radiology DG Chest Portable 1 View  Result Date: 01/24/2021 CLINICAL DATA:  Shortness of breath EXAM: PORTABLE CHEST 1 VIEW COMPARISON:  None FINDINGS: The heart size and mediastinal contours are within normal limits. Both lungs are clear. The visualized skeletal structures are unremarkable. IMPRESSION: Normal study Electronically Signed   By: Charlett Nose M.D.   On: 01/24/2021 03:11    Procedures Procedures   Medications Ordered in ED Medications  albuterol (VENTOLIN HFA) 108 (90 Base) MCG/ACT inhaler 4 puff (4 puffs Inhalation Given 01/24/21 0401)    ED Course  I have reviewed the triage vital signs and the nursing notes.  Pertinent labs & imaging results that were available during my care of the patient were reviewed by me and considered in my medical decision making (see chart for details).    MDM Rules/Calculators/A&P                           Patient presents to the ED with complaints of dyspnea.  Nontoxic, vitals without significant abnormality.   Additional history obtained:  Additional history obtained from chart review & nursing note review.   Imaging Studies ordered:  I ordered imaging studies which included CXR, I independently reviewed, formal radiology impression shows: Normal study  ED Course:  Mild expiratory wheezing noted, given inhaler, cleared, feels much better without complaints. Low risk wells- doubt PE. CXR w/o infiltrate, pulm edema, or pneumothorax. Counseled on tobacco cessation. At one point during visit was requesting a medication for his mind to "chill" when asked if anxious, upset, suicidal, or homicidal he states no, also denies hallucinations, he states this is resolved and no longer wishes to have any medication the inhaler made him feel better. Hx of similar visits and has been psych cleared for similar most recently 01/22/21. Alert & oriented, does not appear acutely psychotic at this time, no SI/HI. Will discharge. I discussed results, treatment plan, need for follow-up, and return precautions with the patient. Provided opportunity for questions, patient confirmed understanding and is in agreement with plan.   . Portions of this note were generated with Scientist, clinical (histocompatibility and immunogenetics). Dictation errors may occur despite best attempts at proofreading.  Final Clinical Impression(s) / ED Diagnoses Final diagnoses:  Wheezing    Rx / DC Orders ED Discharge Orders    None       Cherly Anderson, PA-C 01/24/21 0443    Molpus, Jonny Ruiz, MD 01/24/21 [phone]

## 2021-01-24 NOTE — H&P (Signed)
Behavioral Health Medical Screening Exam  [name] is an 30 y.o. male.  Patient presents as a walk-in to [facility].  Patient presents as similar presentations boarding that he is fearing for his life but denies any suicidal or homicidal ideations and denies any hallucinations.  Patient reports that he has not contacted his ACT team.  Patient reports that he would like to have some breakfast and needs a ride back to his apartment. Patient is presented to the ED or to the [facility] approximately 19 times reporting fear for his life and paranoia.  Patient has continued with denying any suicidal homicidal ideations or having any type of hallucinations.  It is been confirmed with the patient's ACT team that he has a fully furnished apartment and also has food there.  He states they have continue to try to keep him at his apartment but the patient continues to go to the hospital.  Patient does not appear to be responding to any internal or external stimuli patient presents with a chronic paranoia of fear for his life.  Patient presents well dressed and maintained with good eye contact.  Patient will be provided with transportation back to his apartment.  Patient's medications are provided to him by his ACT team.  Total Time spent with patient: 20 minutes  Psychiatric Specialty Exam: Physical Exam Vitals and nursing note reviewed.  Constitutional:      Appearance: He is well-developed.  HENT:     Head: Normocephalic.  Eyes:     Pupils: Pupils are equal, round, and reactive to light.  Cardiovascular:     Rate and Rhythm: Normal rate.  Pulmonary:     Effort: Pulmonary effort is normal.  Musculoskeletal:        General: Normal range of motion.  Neurological:     Mental Status: He is alert and oriented to person, place, and time.    Review of Systems  Constitutional: Negative.   HENT: Negative.   Eyes: Negative.   Respiratory: Negative.   Cardiovascular: Negative.   Gastrointestinal:  Negative.   Genitourinary: Negative.   Musculoskeletal: Negative.   Skin: Negative.   Neurological: Negative.   Psychiatric/Behavioral:       Reports paranoid and fear for his life   Blood pressure (!) 141/85, pulse 89, temperature 98.1 F (36.7 C), SpO2 100 %.There is no height or weight on file to calculate BMI. General Appearance: Casual and Well Groomed Eye Contact:  Good Speech:  Clear and Coherent and Normal Rate Volume:  Normal Mood:  Euthymic Affect:  Appropriate and Congruent Thought Process:  Coherent and Descriptions of Associations: Intact Orientation:  Full (Time, Place, and Person) Thought Content:  WDL and Paranoid Ideation Suicidal Thoughts:  No Homicidal Thoughts:  No Memory:  Immediate;   Good Recent;   Good Remote;   Good Judgement:  Fair Insight:  Fair Psychomotor Activity:  Normal Concentration: Concentration: Good Recall:  Good Fund of Knowledge:Fair Language: Good Akathisia:  No Handed:  Right AIMS (if indicated):    Assets:  Communication Skills Desire for Improvement Financial Resources/Insurance Housing Physical Health Resilience Social Support Transportation Sleep:     Musculoskeletal: Strength & Muscle Tone: within normal limits Gait & Station: normal Patient leans: N/A  Blood pressure (!) 141/85, pulse 89, temperature 98.1 F (36.7 C), SpO2 100 %.  Recommendations: Based on my evaluation the patient does not appear to have an emergency medical condition.  Gerlene Burdock Devery Odwyer, FNP 01/24/2021, 7:43 AM

## 2021-01-24 NOTE — ED Notes (Signed)
After 1 puff of albuterol pt states, "that just changed my life". He states improvement in breathing

## 2021-01-24 NOTE — ED Notes (Signed)
Pt stating he cannot breath and points to his head. He is saying he needs medication for his mind.

## 2021-01-24 NOTE — ED Triage Notes (Signed)
Brought in by EMS from parking lot, pt is homeless. C/o SOB. Reports mild inspiratory wheezing.

## 2021-01-24 NOTE — Discharge Instructions (Addendum)
You were seen in the emergency department today for trouble breathing.  Your chest x-ray was normal.  You were given an inhaler, please use this 1 to 2 puffs every 4-6 hours as needed for trouble breathing or wheezing.  Please try to stop smoking cigarettes as this is likely contributing to your symptoms.  Please follow-up with primary care as soon as possible.  Return to the ER for any new or worsening symptoms or any other concerns.

## 2021-01-24 NOTE — BH Assessment (Signed)
Comprehensive Clinical Assessment (CCA) Note  01/24/2021 [name] [id number] Disposition: Money NP recommends patient be discharged and will follow up with current provider (ACTT) for ongoing services.   Flowsheet Row [facility] from 01/22/2021 in [facility] ED from 01/18/2021 in [facility]  C-SSRS RISK CATEGORY No Risk No Risk No Risk     The patient demonstrates the following risk factors for suicide: Chronic risk factors for suicide include: N/A. Acute risk factors for suicide include: N/A. Protective factors for this patient include: coping skills. Considering these factors, the overall suicide risk at this point appears to be low.Patient is appropriate for outpatient follow up.  Patient presents to [facility] this date voluntary stating he "is scared for his life" although will not elaborate on content of statement. Patient denies any S/I, H/I or AVH. Patient is well known to area EDs and was last seen two days ago (01/22/21) when he presented to [facility] with similar symptoms on 01/23/21 after he reported he "was smoking a lot of weed and became paranoid." Patient denied any self harm that date and did not meet inpatient criteria at that time. Patient is currently followed by a ACT team who assist him with ongoing services. Patient renders limited history this date stating he "needs a ride to his home and some breakfast."    Per note review from 01/18/21 TTS writes: Patient was assessed twice on 04/10. He received an IM Invega shot on 04/10 and was discharged to care of [facility] ACTT team. He came to [facility] on 04/11 and an order was put in at 00:59 on 04/12 for a teleassessment. Patient did not meet inpatient care criteria. Patient walked to [facility] upon d/c from [facility]. Patient continues to deny any SI or HI. When asked about hallucinations he endorses hearing voices but not being  able to tell what they are saying. Patient denies visual hallucinations. He says "I am scared for my life." He cannot identify why he thinks that he should be fearful. He answers "I don't know" to many inquiries. Patient is asked about use of ETOH or other substances and he answers "I don't know.": Pt was asked by [name], PA if he felt he could contract for safety and patient says "no." Pt is currently homeless.  Patient since that date has acquired housing and contnues to receive services from [facility]. Patient presents as alert and oriented. Patient's presents with a pleasant affect with memory intact and thoughts organized. Patient does not appear to be responding to internal stimuli.     Chief Complaint:  Chief Complaint  Patient presents with  . Psychiatric Evaluation   Visit Diagnosis: Schizophrenia     CCA Screening, Triage and Referral (STR)  Patient Reported Information How did you hear about [place]? Self  Referral name: Self  Referral phone number: 0 (N/A)   Whom do you see for routine medical problems? I don't have a doctor  Practice/Facility Name: No data recorded Practice/Facility Phone Number: No data recorded Name of Contact: No data recorded Contact Number: No data recorded Contact Fax Number: No data recorded Prescriber Name: No data recorded Prescriber Address (if known): No data recorded  What Is the Reason for Your Visit/Call Today? Pt states, "I'm scared for my life." Pt denies SI, HI, AVH. Pt has an apartment but he refuses to stay there.  How Long Has This Been Causing You Problems? <Week  What  Do You Feel Would Help You the Most Today? Treatment for Depression or other mood problem; Social Support   Have You Recently Been in Any Inpatient Treatment (Hospital/Detox/Crisis Center/28-Day Program)? No  Name/Location of Program/Hospital:[facility]  How Long Were You There? Per chart, "2 days."  When Were You Discharged? 02/24/2020 (Per  chart.)   Have You Ever Received Services From [facility] Before? Yes  Who Do You See at [facility]? ED 01/11/2021; TTS and ED 01/15/2021; TTS and ED 01/16/2021 and 01/17/21   Have You Recently Had Any Thoughts About Hurting Yourself? No  Are You Planning to Commit Suicide/Harm Yourself At This time? No   Have you Recently Had Thoughts About Hurting Someone [name]? No  Explanation: No data recorded  Have You Used Any Alcohol or Drugs in the Past 24 Hours? No  How Long Ago Did You Use Drugs or Alcohol? 0000 (Unclear)  What Did You Use and How Much? Pt denies SA, even when questioned a second time.   Do You Currently Have a Therapist/Psychiatrist? No (Pt reportedly has a ACTT team through [place].)  Name of Therapist/Psychiatrist: ACTT Team - agency name unknown   Have You Been Recently Discharged From Any Public relations account executive or Programs? No  Explanation of Discharge From Practice/Program: No data recorded    CCA Screening Triage Referral Assessment Type of Contact: Face-to-Face  Is this Initial or Reassessment? Initial Assessment  Date Telepsych consult ordered in [facility]:  01/18/2021  Time Telepsych consult ordered in [facility]:  0321   Patient Reported Information Reviewed? Yes  Patient Left Without Being Seen? No  Reason for Not Completing Assessment: No data recorded  Collateral Involvement: Per Active FYIs, pt does *not* have a guardian.   Does Patient Have a Automotive engineer Guardian? No data recorded Name and Contact of Legal Guardian: [name], legal guardian.   If Minor and Not Living with Parent(s), Who has Custody? N/A  Is CPS involved or ever been involved? Never  Is APS involved or ever been involved? Never   Patient Determined To Be At Risk for Harm To Self or Others Based on Review of Patient Reported Information or Presenting Complaint? No (Pt however says he cannot contract for safety.)  Method: No Plan  Availability of Means: No access or  NA  Intent: Vague intent or NA  Notification Required: No need or identified person  Additional Information for Danger to Others Potential: Active psychosis  Additional Comments for Danger to Others Potential: History of children playing outside triggering patient.  Are There Guns or Other Weapons in Your Home? No  Types of Guns/Weapons: No data recorded Are These Weapons Safely Secured?                            No data recorded Who Could Verify You Are Able To Have These Secured: No data recorded Do You Have any Outstanding Charges, Pending Court Dates, Parole/Probation? None noted  Contacted To Inform of Risk of Harm To Self or Others: Other: Comment (N/A)   Location of Assessment: -- ([facility])   Does Patient Present under Involuntary Commitment? No  IVC Papers Initial File Date: 10/22/2020   [place] of Residence: [place] (Homeless in [place])   Patient Currently Receiving the Following Services: ACTT ([facility])   Determination of Need: Routine (7 days)   Options For Referral: Other: Comment (ACT Team)     CCA Biopsychosocial Intake/Chief Complaint:  Earlier today,  Pt presented with paranoia  Current Symptoms/Problems: Isolation, fatigued   Patient Reported Schizophrenia/Schizoaffective Diagnosis in Past: Yes   Strengths: UTA  Preferences: UTA  Abilities: UTA   Type of Services Patient Feels are Needed: Pt said he did not know.  Earlier, he stated that he wanted medication for his mind   Initial Clinical Notes/Concerns: Pt very guarded, denied SI, HI, AVH, self-injurious behavior, and substance use concerns   Mental Health Symptoms Depression:  Change in energy/activity   Duration of Depressive symptoms: Greater than two weeks   Mania:  Change in energy/activity   Anxiety:   Restlessness; Fatigue   Psychosis:  Affective flattening/alogia/avolition   Duration of Psychotic symptoms: Greater than six months    Trauma:  Re-experience of traumatic event (Pt reports the dealth of his mother)   Obsessions:  Poor insight; Recurrent & persistent thoughts/impulses/images   Compulsions:  Repeated behaviors/mental acts; Disrupts with routine/functioning   Inattention:  Disorganized; Does not follow instructions (not oppositional); Does not seem to listen; Fails to pay attention/makes careless mistakes; Poor follow-through on tasks   Hyperactivity/Impulsivity:  Feeling of restlessness; Fidgets with hands/feet (Pt remain on his phone during the consult, also, used the stool to go around while talking to examiner.)   Oppositional/Defiant Behaviors:  Defies rules; Easily annoyed; Intentionally annoying   Emotional Irregularity:  Chronic feelings of emptiness   Other Mood/Personality Symptoms:  paranoia (per earlier report)    Mental Status Exam Appearance and self-care  Stature:  Average   Weight:  Average weight   Clothing:  Disheveled   Grooming:  Neglected   Cosmetic use:  Age appropriate   Posture/gait:  Normal   Motor activity:  Not Remarkable   Sensorium  Attention:  Normal   Concentration:  Normal   Orientation:  Person; Place; Time   Recall/memory:  Normal   Affect and Mood  Affect:  Flat   Mood:  Negative   Relating  Eye contact:  Normal   Facial expression:  Constricted   Attitude toward examiner:  Guarded   Thought and Language  Speech flow: Blocked; Paucity   Thought content:  Suspicious   Preoccupation:  None   Hallucinations:  None   Organization:  No data recorded  [employer] of Knowledge:  Average   Intelligence:  Needs investigation   Abstraction:  Concrete   Judgement:  Fair   Reality Testing:  Adequate   Insight:  Poor   Decision Making:  Only simple   Social Functioning  Social Maturity:  Isolates   Social Judgement:  "Street Smart"   Stress  Stressors:  Surveyor, quantity (Pt reports that he needs money for a room and  transportation)   Coping Ability:  Exhausted   Skill Deficits:  Activities of daily living; Intellect/education; Interpersonal; Self-care; Self-control   Supports:  Support needed; Friends/Service system     Religion:    Leisure/Recreation:    Exercise/Diet:     CCA Employment/Education Employment/Work Situation:    Education:     CCA Family/Childhood History Family and Relationship History:    Childhood History:     Child/Adolescent Assessment:     CCA Substance Use Alcohol/Drug Use:                           ASAM's:  Six Dimensions of Multidimensional Assessment  Dimension 1:  Acute Intoxication and/or Withdrawal Potential:      Dimension 2:  Biomedical Conditions and Complications:  Dimension 3:  Emotional, Behavioral, or Cognitive Conditions and Complications:     Dimension 4:  Readiness to Change:     Dimension 5:  Relapse, Continued use, or Continued Problem Potential:     Dimension 6:  Recovery/Living Environment:     ASAM Severity Score:    ASAM Recommended Level of Treatment:     Substance use Disorder (SUD)    Recommendations for Services/Supports/Treatments:    DSM5 Diagnoses: Patient Active Problem List   Diagnosis Date Noted  . 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 Schizophrenia, paranoid type (HCC) 10/24/2020  . Paranoid schizophrenia (HCC) 01/05/2020  . Other schizophrenia (HCC) 02/14/2018  . Cannabis use disorder, moderate, dependence (HCC) 05/10/2016    Patient Centered Plan: Patient is on the following Treatment Plan(s):   Referrals to Alternative Service(s): Referred to Alternative Service(s):   Place:   Date:   Time:    Referred to Alternative Service(s):   Place:   Date:   Time:    Referred to Alternative Service(s):   Place:   Date:   Time:    Referred to Alternative Service(s):   Place:   Date:   Time:     Alfredia FergusonDavid L Deanie Jupiter, LCAS

## 2021-01-26 ENCOUNTER — Encounter (HOSPITAL_COMMUNITY): Payer: Self-pay

## 2021-01-26 ENCOUNTER — Emergency Department (HOSPITAL_COMMUNITY)
Admission: EM | Admit: 2021-01-26 | Discharge: 2021-01-27 | Disposition: A | Discharge status: Home / Self Care | Payer: Medicaid Other | Attending: Emergency Medicine | Admitting: Emergency Medicine

## 2021-01-26 DIAGNOSIS — F17210 Nicotine dependence, cigarettes, uncomplicated: Secondary | ICD-10-CM | POA: Insufficient documentation

## 2021-01-26 DIAGNOSIS — F419 Anxiety disorder, unspecified: Secondary | ICD-10-CM | POA: Diagnosis present

## 2021-01-26 DIAGNOSIS — F209 Schizophrenia, unspecified: Secondary | ICD-10-CM | POA: Diagnosis not present

## 2021-01-26 MED ORDER — HYDROXYZINE HCL 25 MG PO TABS: 25.0000 mg | ORAL_TABLET | Freq: Four times a day (QID) | ORAL | 0 refills | Status: DC | PRN

## 2021-01-26 NOTE — ED Triage Notes (Signed)
Pt states that he's having bad anxiety and he needs some medicine, he states he's afraid for his life

## 2021-01-26 NOTE — ED Notes (Signed)
Pt walked to nurses station and stated he "needs to see his doctor." Pt says he needs his medication for his anxiety. Advised pt to wait back in room and we would notify his provider. Pt agreeable and walked back to his room without incident.   Once a provider is assigned, will pass message along.

## 2021-01-27 ENCOUNTER — Ambulatory Visit (HOSPITAL_COMMUNITY)
Admission: AD | Admit: 2021-01-27 | Discharge: 2021-01-27 | Disposition: A | Discharge status: Home / Self Care | Payer: Medicaid Other | Attending: Psychiatry | Admitting: Psychiatry

## 2021-01-27 NOTE — H&P (Addendum)
Behavioral Health Medical Screening Exam  [name] is a 30 y.o. male who presented to the [facility] as a walk-in voluntarily with complaints of feeling "scared for my life."  The patient has a psychiatric history of paranoid schizophrenia. Per chart review, he's had approximately 19 visits to the ED and behavioral health services in the past six months with similar presentations. Prior to arrival at [facility], the patient was evaluated and treated at [facility] on 01/26/21 at 10:04 pm.  Patient reports feeling "scared for my life." He is unable to explain why he feels scared for his life. He is unable to identify triggers, stressors, or events attributing to why he is feeling scared for his life. He answers the assessment questions with "I don't know" responses. When asked about his hospital encounter last night on 01/26/21 at [facility], he stated "nothing happened." He stated that he needs help with some medications, "a chill pill." When asked what type of medication he's referring to as a "chill pill" he stated, "Xanax."    On approach, the patient is alert and oriented x 3. He denies suicidal and homicidal ideations. He denies auditory and visual hallucinations. He does not appear to be responding to internal, or external stimuli. He denies feeling paranoid, however he presents with chronic paranoid thoughts. He denies using illicit drugs, including THC, heroin, or cocaine. He denies alcohol use. He does appear as if he consumed some type of substance tonight, AEB his eyes appearing bloodshot red. Patient denies having an ACT and stated "I don't know them." Patient stated that he is homeless. Per chart review on 01/24/21, it was verified by the patient's ACT team "he has a fully furnished apartment and also has food there.  He states they have continue to try to keep him at his apartment but the patient continues to go to the hospital."  Patient denies taking any psychotropic at this time. Per chart  review: Patient received Invega Sustenna 156 mg IM on 01/16/21 at [facility].      Total Time spent with patient: 15 minutes  Psychiatric Specialty Exam:  Presentation  General Appearance: Appropriate for Environment  Eye Contact:Fair  Speech:Clear and Coherent  Speech Volume:Normal  Handedness:Right   Mood and Affect  Mood:Anxious  Affect:Congruent   Thought Process  Thought Processes:Coherent  Descriptions of Associations:Intact  Orientation:Full (Time, Place and Person)  Thought Content:Paranoid Ideation  History of Schizophrenia/Schizoaffective disorder:No  Duration of Psychotic Symptoms:Greater than six months  Hallucinations:Hallucinations: None  Ideas of Reference:Paranoia  Suicidal Thoughts:Suicidal Thoughts: No  Homicidal Thoughts:Homicidal Thoughts: No   Sensorium  Memory:Immediate Fair; Recent Fair; Remote Fair  Judgment:Intact  Insight:Present   Executive Functions  Concentration:Fair  Attention Span:Fair  Recall:Fair  Fund of Knowledge:Fair  Language:Fair   Psychomotor Activity  Psychomotor Activity:Psychomotor Activity: Restlessness   Assets  Assets:Communication Skills; Desire for Improvement; Leisure Time; Physical Health   Sleep  Sleep:Sleep: Fair    Physical Exam: Physical Exam Constitutional:      Appearance: Normal appearance.  HENT:     Head: Normocephalic and atraumatic.     Nose: Nose normal.  Cardiovascular:     Rate and Rhythm: Normal rate.     Pulses: Normal pulses.  Pulmonary:     Breath sounds: Wheezing present.     Comments: Hx of asthma Musculoskeletal:        General: Normal range of motion.     Cervical back: Normal range of motion.  Neurological:     Mental Status: He  is alert.    Review of Systems  Constitutional: Negative.   HENT: Negative.   Eyes: Negative.   Respiratory: Positive for wheezing.   Cardiovascular: Negative.   Gastrointestinal: Negative.   Skin: Negative.    Neurological: Negative.   Endo/Heme/Allergies: Negative.   Psychiatric/Behavioral: Negative for depression, substance abuse and suicidal ideas.       Paranoia    Blood pressure 127/82, pulse 98, temperature 98.8 F (37.1 C), temperature source Oral, resp. rate 18, SpO2 96 %. There is no height or weight on file to calculate BMI.  Musculoskeletal: Strength & Muscle Tone: within normal limits Gait & Station: normal Patient leans: N/A   Recommendations:  Based on my evaluation the patient does not appear to have an emergency medical condition. Patient denies SI/HI. Patient denies AVH. Patient does not appear to be responding to internal stimuli, or external stimuli. Based on my assessment, the patient does not meet criteria for observation, or inpatient treatment. Patient encouraged to follow-up with the ACT team. TTS counselor unable to get in contact with the patient's aunt's Ms. [name], or the ACT team this morning. Patient was provided with a taxi voucher for transportation to [address], per request.   Layla Barter, NP 01/27/2021, 3:18 AM

## 2021-01-27 NOTE — ED Provider Notes (Signed)
[facility]-EMERGENCY DEPT Provider Note   CSN: [id number] Arrival date & time: 01/26/21  2151     History No chief complaint on file.   [name] is a 30 y.o. male.  30 year old male who is very well-known to this department as of recently for schizophrenia and anxiety related issues.  He presents evaluation for anxiety.  I walk in the room and wake him up and he states he needs a chill pill.  He then goes back to sleep.  Awake, began states he has no other complaints.  He has no chest pain, shortness of breath, back pain or other associated symptoms.        Past Medical History:  Diagnosis Date  . Depression   . Schizophrenia [facility])     Patient Active Problem List   Diagnosis Date Noted  . 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 Schizophrenia, paranoid type (HCC) 10/24/2020  . Paranoid schizophrenia (HCC) 01/05/2020  . Other schizophrenia (HCC) 02/14/2018  . Cannabis use disorder, moderate, dependence (HCC) 05/10/2016    Past Surgical History:  Procedure Laterality Date  . BACK SURGERY         History reviewed. No pertinent family history.  Social History   Tobacco Use  . Smoking status: Current Some Day Smoker    Types: Cigarettes  . Smokeless tobacco: Never Used  Vaping Use  . Vaping Use: Never used  Substance Use Topics  . Alcohol use: No  . Drug use: Yes    Types: Marijuana    Comment: rare    Home Medications Prior to Admission medications   Medication Sig Start Date End Date Taking? Authorizing Provider  hydrOXYzine (ATARAX/VISTARIL) 25 MG tablet Take 1 tablet (25 mg total) by mouth every 6 (six) hours as needed for anxiety. 01/26/21  Yes Lindsea Olivar, Barbara Cower, MD  paliperidone (INVEGA) 6 MG 24 hr tablet Take 1 tablet (6 mg total) by mouth daily for 14 days. Patient not taking: Reported on 01/22/2021 01/02/21 01/16/21  Mare Ferrari, PA-C    Allergies    Shellfish allergy and Bee pollen  Review of Systems   Review of  Systems  All other systems reviewed and are negative.   Physical Exam Updated Vital Signs BP 128/81 (BP Location: Left Arm)   Pulse (!) 107   Temp 98.2 F (36.8 C) (Oral)   Resp 18   Ht 5\' 7"  (1.702 m)   Wt 74.8 kg   SpO2 98%   BMI 25.84 kg/m   Physical Exam Vitals and nursing note reviewed.  Constitutional:      Appearance: He is well-developed.  HENT:     Head: Normocephalic and atraumatic.     Mouth/Throat:     Mouth: Mucous membranes are moist.     Pharynx: Oropharynx is clear.  Eyes:     Pupils: Pupils are equal, round, and reactive to light.  Cardiovascular:     Rate and Rhythm: Normal rate.  Pulmonary:     Effort: Pulmonary effort is normal. No respiratory distress.  Abdominal:     General: Abdomen is flat. There is no distension.  Musculoskeletal:        General: Normal range of motion.     Cervical back: Normal range of motion.  Skin:    General: Skin is warm and dry.  Neurological:     General: No focal deficit present.     Mental Status: He is alert.     ED Results / Procedures / Treatments  Labs (all labs ordered are listed, but only abnormal results are displayed) Labs Reviewed - No data to display  EKG None  Radiology No results found.  Procedures Procedures   Medications Ordered in ED Medications - No data to display  ED Course  I have reviewed the triage vital signs and the nursing notes.  Pertinent labs & imaging results that were available during my care of the patient were reviewed by me and considered in my medical decision making (see chart for details).    MDM Rules/Calculators/A&P                          Patient does not appear to be very anxious as he is sleeping.  Will DC on Vistaril.  Final Clinical Impression(s) / ED Diagnoses Final diagnoses:  Anxiety    Rx / DC Orders ED Discharge Orders         Ordered    hydrOXYzine (ATARAX/VISTARIL) 25 MG tablet  Every 6 hours PRN        01/26/21 2351            Yussef Jorge, Barbara Cower, MD 01/27/21 0011

## 2021-01-27 NOTE — BH Assessment (Addendum)
Comprehensive Clinical Assessment (CCA) Screening, Triage and Referral Note  01/27/2021 Arend Bahl [id number] Disposition: Patient was seen by [name], NP for his MSE.  This clinician talked to patient.  Patient gave permission eventually to contact [name] [phone].  Clinician called and left two messages but has not heard back from her yet.  The plan is for him now get a taxi voucher to the residence we have on file.  That address is [address].  Pt said he would go there.  Pt was escorted to lobby of [name] by clinician and NP.    Patient went to [facility] and had told the doctor [facility]) that he was not having any SI, HI or A/V hallucinations. He told Dr. Clayborne Dana "I just need a "chill pill." Patient then was discharged. Patient then walked from [facility] to [facility]. When this clinician asked what was different between leaving [facility] and coming to [facility] patient says "I don't know." He answers that way to most every question. He does deny any SI, HI or A/V hallucinations. Patient was asked if it was okay to call [name] and patient says "no." When asked if he had [name] services he says he does not. Pt was asked if he had his own apartment and he says no. Pt says he is homeless. The most that patient would speak to clinician was to ask for an apple juice or ginger ale. Flowsheet Row OP [facility] from 01/26/2021 in [facility] OP Visit from 01[facility]24/2021 [facility]  C-SSRS RISK CATEGORY No Risk Error: Question 2 not populated No Risk       Accordint to [employer], LCSW on 01/14/21; Ms. [name], Patients aunt and payee. Ms. [name] stated patient does not have a legal guardian. Ms. [name] stated that patient lives with her but has a 2 bedroom 2 bathroom apartment in [place]. Ms. [name] stated patient does not like staying there because he is afraid and  does not know anymore. Ms. [name] stated that patient likes to be in [place].  Patient has sleepy affect.  He does make eye contact but he is oriented x3.  He mumbles to himself.  Pt says "I don't know" to most questions but he does not evidence delusional thinking.  Pt asks for food and says that he is homeless.  Presumably his sleep is not good and he may have difficulty with securing food.    Pt reportedly has ACTT team services through [place].  This clinician called [facility] but only got their answering service.  They do not turn over the phones back to [facility] until after 08:00.    Chief Complaint: No chief complaint on file.  Visit Diagnosis: Schizozaffective d/o  Patient Reported Information How did you hear about [place]? Self   Referral name: Patient   Referral phone number: 0 (N/A)  Whom do you see for routine medical problems? I don't have a doctor   Practice/Facility Name: No data recorded  Practice/Facility Phone Number: No data recorded  Name of Contact: No data recorded  Contact Number: No data recorded  Contact Fax Number: No data recorded  Prescriber Name: No data recorded  Prescriber Address (if known): No data recorded What Is the Reason for Your Visit/Call Today? Patient went to [facility] and had told the doctor [facility]) that he was not having any SI, HI or A/V hallucinations.  He told Dr. Clayborne Dana "  I just need a "chill pill."  Patient then was discharged.  Patient then walked from [facility] to [facility].  When this clinician asked what was different between leaving [facility] and coming to [facility] patient says "I don't know."  He answers that way to most every question.  He does deny any SI, HI or A/V hallucinations.  Patient was asked if it was okay to call [name] and patient says "no."  When asked if he had [name] services he says he does not.  Pt was asked if he had his own apartment and he says o.  How Long Has This Been Causing You Problems? 1 wk - 1 month  Have You Recently Been  in Any Inpatient Treatment (Hospital/Detox/Crisis Center/28-Day Program)? No   Name/Location of Program/Hospital:[facility]   How Long Were You There? Per chart, "2 days."   When Were You Discharged? 02/24/2020 (Per chart.)  Have You Ever Received Services From [facility] Before? Yes   Who Do You See at [facility]? Pt has been seen multiple times with similar presentation as today  Have You Recently Had Any Thoughts About Hurting Yourself? No   Are You Planning to Commit Suicide/Harm Yourself At This time?  No  Have you Recently Had Thoughts About Hurting Someone [name]? No   Explanation: No data recorded Have You Used Any Alcohol or Drugs in the Past 24 Hours? No   How Long Ago Did You Use Drugs or Alcohol?  0000 (Unclear)   What Did You Use and How Much? Pt denies SA, even when questioned a second time.  What Do You Feel Would Help You the Most Today? -- (NA)  Do You Currently Have a Therapist/Psychiatrist? Yes   Name of Therapist/Psychiatrist: Pt currently has a ACT Team   Have You Been Recently Discharged From Any Office Practice or Programs? No   Explanation of Discharge From Practice/Program:  No data recorded    CCA Screening Triage Referral Assessment Type of Contact: Face-to-Face   Is this Initial or Reassessment? Initial Assessment   Date Telepsych consult ordered in [facility]:  01/24/2021   Time Telepsych consult ordered in [facility]:  0321  Patient Reported Information Reviewed? Yes   Patient Left Without Being Seen? No   Reason for Not Completing Assessment: No data recorded Collateral Involvement: Per Active FYIs, pt does *not* have a guardian.  Does Patient Have a Automotive engineer Guardian? No data recorded  Name and Contact of Legal Guardian:  [name], legal guardian.   If Minor and Not Living with Parent(s), Who has Custody? N/A  Is CPS involved or ever been involved? Never  Is APS involved or ever been involved? Never  Patient Determined To Be  At Risk for Harm To Self or Others Based on Review of Patient Reported Information or Presenting Complaint? No   Method: No Plan   Availability of Means: No access or NA   Intent: Vague intent or NA   Notification Required: No need or identified person   Additional Information for Danger to Others Potential:  Active psychosis   Additional Comments for Danger to Others Potential:  History of children playing outside triggering patient.   Are There Guns or Other Weapons in Your Home?  No    Types of Guns/Weapons: No data recorded   Are These Weapons Safely Secured?  No data recorded   Who Could Verify You Are Able To Have These Secured:    No data recorded Do You Have any Outstanding Charges, Pending Court Dates, Parole/Probation? None noted  Contacted To Inform of Risk of Harm To Self or Others: Unable to Contact: (NA)  Location of Assessment: -- [facility])  Does Patient Present under Involuntary Commitment? No   IVC Papers Initial File Date: 10/22/2020   [place] of Residence: [place]  Patient Currently Receiving the Following Services: ACTT Psychologist, educational)   Determination of Need: Routine (7 days)   Options For Referral: -- (Refer back to ACTT for ongoing services)   [place]andria Lodge, LCAS

## 2021-01-29 ENCOUNTER — Ambulatory Visit (HOSPITAL_COMMUNITY)
Admission: EM | Admit: 2021-01-29 | Discharge: 2021-01-30 | Disposition: A | Discharge status: Home / Self Care | Payer: Medicaid Other | Attending: Student | Admitting: Student

## 2021-01-29 ENCOUNTER — Other Ambulatory Visit: Payer: Self-pay

## 2021-01-29 DIAGNOSIS — F200 Paranoid schizophrenia: Secondary | ICD-10-CM | POA: Insufficient documentation

## 2021-01-29 NOTE — BH Assessment (Addendum)
Patient presents to [facility] by himself requesting money for a room, place to stay and a chill pill to get his mind right. Patient denies SI/ HI / AVH but states he is scared for his life but can't give any details of what he is scared of. Patient was discharged from [facility] on 01/27/21 for same presentation . Patient denies any substance use in past 24-48 hours. Patient is routine .     Collateral:  [name] Team Lead: Arbie Cookey [phone] Patient currently has open APS case open with [facility]; met with patient Thursday regarding possible issues with aunt/payee. Patient is his own guardian at this time. Patient continues to refuse PO medications. Provider discussed patient presentation and not meeting inpatient criteria; Arbie Cookey agrees patient is currently at baseline, voiced no acute safety concerns at this time. Plan to discharge patient to follow up with ACTT team.   HPI:   [name] is a 30 year old male well known to [place] who presented to [facility] with paranoia stating he feared for his life. This is [name]'s 4'th ED encounter this week, 7th this month. Patient received his IM Kirt Boys injection 01/16/21 after presenting to [facility] following discharge from [facility]. He is currently being followed by [employer] Team and his current payee is his aunt [name].

## 2021-01-29 NOTE — Discharge Instructions (Addendum)

## 2021-01-29 NOTE — ED Provider Notes (Addendum)
Behavioral Health Urgent Care Medical Screening Exam  Patient Name: [name] MRN: [id number] Date of Evaluation: 01/30/21 Chief Complaint:  "Scared for my life" Diagnosis:  Final diagnoses:  Schizophrenia, paranoid ([place])    History of Present illness: [name] is a 30 y.o. male with psychiatric history of paranoid schizophrenia and history of multiple [place] and ED visits over the past month (patient has presented to the behavioral urgent care or the surrounding emergency departments greater than 19 times over the past 6 months with similar presentation- see past notes for further details regarding past visits/assessments if necessary) who presents to the behavioral health urgent care as a voluntary walk-in.  Patient is well known to our service. Patient states that he came to the [facility] because he is "scared for my life" and "need help with money and a place to stay".  When patient is asked to elaborate on why he is "scared for his life", patient states "I don't know.  I'm just scared of people. I don't know" and does not elaborate further.  Patient denies SI, HI, AVH, or delusions.  Patient does endorse paranoia.  However, per patient's history/past evaluations of this patient and per my chart review, patient's presentation of paranoia appears to be similar to his past presentations. Similarly to past visits, patient is asking for a "chill pill" to help him relax.  Although patient is requesting a "chill pill", patient states that he is not currently taking any psychotropic medications and states that he does not want to take psychotropic medications.  Per chart review, patient did voluntarily receive Kirt Boys IM 156 mg injection in his left deltoid when he presented to [facility] as a voluntary walk-in on 01/16/2021.  Per chart review, patient also received 1 time dose of Invega 24-hour 9 mg tablet when he presented to [facility] as a transfer from the emergency department on 01/18/21.  Patient has a  [employer], although patient states that he has not been following up with his ACTT team. Per chart review, collateral has been obtained regarding the patient multiple times in the past, including recent collateral obtained by Oneida Alar, NP on 410/2022, which states (per Oneida Alar, NP 01/16/21 note): "Collateral:  [name] Team Lead: Arbie Cookey [phone] Patient currently has open APS case open with [facility] met with patient Thursday regarding possible issues with aunt/payee. Patient is his own guardian at this time. Patient continues to refuse PO medications. Provider discussed patient presentation and not meeting inpatient criteria; Arbie Cookey agrees patient is currently at baseline, voiced no acute safety concerns at this time."  Patient states that he is homeless and does not have a place to live. However, per collateral shown below and past collateral that has been obtained regarding the patient, patient does have a place to live but chooses not to live there.  Patient denies access to guns or weapons.  Patient denies alcohol or substance use.  He endorses past use of marijuana, but denies any marijuana use over the past few weeks.  Patient refused for his aunt to be contacted for collateral information, but the patient did provide consent to speak with patient's ACTT team lead.  With patient's consent, collateral information was obtained by Gerre Scull, LCSWA and myself speaking with the patient's [facility] ACTT team lead Arbie Cookey [name]: [phone]).  Ms. Lupita Leash states that the patient's usual presentation/baseline is that the patient is paranoid and has been smoking a lot of marijuana. Ms. Lupita Leash states that the patient is continuing to  refuse p.o. medications. She states that she is aware of the patient receiving the Invega IM injection on 01/16/2021 and she states that the ACT team will attempt to give the patient another Invega injection 28 days from 01/16/21 if possible.   Ms. Lupita Leash states that [name] opened an APS report regarding the patient's aunt that is currently under investigation and she states that the patient spoke with social work last Thursday.  Ms. Lupita Leash confirms past documentation that the patient's aunt is his payee but has not been giving the patient food or clothes.  Ms. Lupita Leash also states that patient has a furnished apartment in [facility] but does not want to go/live there because he is paranoid about living there for unknown reasons.  Ms. Lupita Leash states that she believes the patient has been sporadically living in a vacant apartment on [address] in [place]. Ms. Lupita Leash states that the patient does not have a legal guardian (this has been documented in the past notes as well). Ms. Lupita Leash states that the patient meets with a therapist through the ACTT team about 1-2 x/week.  Ms. Lupita Leash does state that it has been difficult to engage in regular follow-up with the ACTT Team psychiatrist due to the patient not answering his phone most of the time. Ms. Lupita Leash states that recently, the patient's psychiatrist saw the patient walking outside and attempted to follow up with the patient while the patient was walking outside. Ms. Lupita Leash states that the patient has all the phone numbers that he needs for his ACTT Team, including her number and the patient's therapist number and that he knows how to call these phone numbers. Ms. Lupita Leash states that they called the patient regularly but had difficulty with following up with the patient via phone due to the patient not answering his phone most of the time. Ms. Lupita Leash states that she/the ACTT team will continue to attempt to contact the patient regularly for continued follow-up.   Patient verbally contracts for safety and states that if he is to be discharged from [facility], he will not try to harm or kill himself.  Psychiatric Specialty Exam  Presentation  General Appearance:Appropriate for Environment; Well Groomed  Eye  Contact:Fair  Speech:Normal Rate; Clear and Coherent  Speech Volume:Normal  Handedness:  Mood and Affect  Mood:Anxious  Affect:Congruent   Thought Process  Thought Processes:Coherent; Goal Directed  Descriptions of Associations:Intact  Orientation:Full (Time, Place and Person)  Thought Content:Paranoid Ideation  Diagnosis of Schizophrenia or Schizoaffective disorder in past: Yes  Duration of Psychotic Symptoms: Greater than six months  Hallucinations: None  Ideas of Reference:Paranoia  Suicidal Thoughts:No  Homicidal Thoughts:No   Sensorium  Memory:Immediate Fair; Recent Fair; Remote Fair  Judgment:Fair  Insight:Fair   Executive Functions  Concentration:Fair  Attention Span:Fair  [place]   Psychomotor Activity  Psychomotor Activity:Normal   Assets  Assets:Communication Skills; Desire for Improvement; Financial Resources/Insurance; Leisure Time; Physical Health; Social Support   Sleep  Sleep:Fair  Number of hours:    Physical Exam: Physical Exam Vitals reviewed.  Constitutional:      General: He is not in acute distress.    Appearance: He is not ill-appearing, toxic-appearing or diaphoretic.     Comments: Patient smells strongly of marijuana.   HENT:     Head: Normocephalic and atraumatic.     Right Ear: External ear normal.     Left Ear: External ear normal.  Cardiovascular:     Rate and Rhythm: Normal  rate.  Pulmonary:     Effort: Pulmonary effort is normal. No respiratory distress.  Musculoskeletal:        General: Normal range of motion.     Cervical back: Normal range of motion.  Neurological:     Mental Status: He is alert and oriented to person, place, and time.     Comments: No tremor noted.   Psychiatric:        Attention and Perception: Attention normal. He does not perceive auditory or visual hallucinations.        Mood and Affect: Mood is anxious.        Speech: Speech  normal.        Behavior: Behavior is not agitated, slowed, aggressive, withdrawn, hyperactive or combative. Behavior is cooperative.        Thought Content: Thought content is paranoid. Thought content is not delusional. Thought content does not include homicidal or suicidal ideation.     Comments: Affect mood congruent. Judgement fair and insight fair.     Review of Systems  Constitutional: Negative for chills, diaphoresis, fever, malaise/fatigue and weight loss.  HENT: Negative for congestion.   Respiratory: Negative for cough and shortness of breath.   Cardiovascular: Negative for chest pain and palpitations.  Gastrointestinal: Negative for abdominal pain, constipation, diarrhea, nausea and vomiting.  Musculoskeletal: Negative for joint pain and myalgias.  Neurological: Negative for dizziness and headaches.  Psychiatric/Behavioral: Negative for depression, hallucinations, memory loss, substance abuse and suicidal ideas. The patient is nervous/anxious. The patient does not have insomnia.        Positive for paranoia.   All other systems reviewed and are negative.    Vitals: Blood pressure (!) 134/94, pulse 88, temperature (!) 97.3 F (36.3 C), temperature source Tympanic, resp. rate 18, SpO2 99 %. There is no height or weight on file to calculate BMI.  Musculoskeletal: Strength & Muscle Tone: within normal limits Gait & Station: normal Patient leans: N/A   [facility] MSE Discharge Disposition for Follow up and Recommendations: Based on my evaluation the patient does not appear to have an emergency medical condition and can be discharged with resources and follow up care in outpatient services for Medication Management and Individual Therapy  Patient denies SI, HI, AVH, or delusions and does not appear to be psychotic at this time.  Patient endorses paranoia, but based on patient's history and my assessment, this paranoid presentation appears to be chronic in nature and similar to past  presentations. Patient does not appear to be a threat to himself or others at this time.  Patient does not meet criteria for inpatient psychiatric treatment or [facility] behavioral health urgent care continuous observation/assessment criteria at this time.  Per obtained collateral information, patient has all the phone numbers that he needs for his ACTT Team, including the ACTT Team lead phone number and the patient's therapist's phone number and that he knows how to call these phone numbers. Recommend that the patient contact his ACTT team/follow-up with his ACTT team, whether it be the ACTT team lead Sharyne Peach), patient's ACTT team therapist, or somebody else within the ACTT team, in order to receive his next monthly Invega injection/discuss potential psychotropic medication adjustments and to continue his therapy sessions as well.  Patient verbalizes understanding and agreement of this plan.  Safety planning done at length with the patient regarding appropriate actions to take/resources to utilize [facility], nearest ED, 911, suicide prevention Lifeline) if the patient becomes suicidal or homicidal, if his condition rapidly  deteriorates/worsens/does not improve, or if the patient begins to experience a mental health crisis.  This information was also included in the patient's AVS as well.   Patient verbalizes understanding and agreement of the overall plan, including safety plan. All patient's questions answered and concerns addressed. Patient states that he walked to [facility] and does not want his aunt or anyone else to be contacted to pick him up from [facility].  Patient states that he prefers to leave [place] on foot. Patient discharged to the street/his destination of choice.   Prescilla Sours, PA-C 01/30/2021, 2:53 AM

## 2021-01-30 ENCOUNTER — Other Ambulatory Visit: Payer: Self-pay

## 2021-01-30 ENCOUNTER — Ambulatory Visit (HOSPITAL_COMMUNITY)
Admission: EM | Admit: 2021-01-30 | Discharge: 2021-01-30 | Disposition: A | Discharge status: Home / Self Care | Payer: Medicaid Other

## 2021-01-30 DIAGNOSIS — F200 Paranoid schizophrenia: Secondary | ICD-10-CM

## 2021-01-30 NOTE — BH Assessment (Signed)
[name] Routine MR # [id number]: 30 years old presents voluntarily to [facility] via [facility].  Pt reports that he is scared for his life.  Pt denied SI, HI, AVH.  Pt agreed that he visited the [facility] earlier and reaffirmed that he was not going to hurt himself or anyone else. Pt signed the Waiver of Medical Screening Exam.

## 2021-01-30 NOTE — ED Notes (Addendum)
Patient discharge instructions provided with emphasis to follow-up with current Provider for medication management.  Patient alert and oriented and in no apparent distress.  Clear speech with appropriate responses.  Patient denied any pain, discomfort or disturbance and verbalized understanding.  Only report of "I'm tired and hungry".  Patient escorted to locker with [facility] present.  Acknowledged possession of all belongings from locker.  Patient provided crackers, peanut butter and water.  No further needs or concerns.  Patient discharged without difficulty.

## 2021-01-30 NOTE — ED Notes (Signed)
Patient assessed in [place].  Per provider, patient ready to be discharged.  AVS printed and taken to [facility].  Per security, patient had already left the premises.  Provider aware.

## 2021-01-30 NOTE — ED Provider Notes (Signed)
Behavioral Health Urgent Care Medical Screening Exam  Patient Name: [name] MRN: [id number] Date of Evaluation: 01/30/21 Chief Complaint:  "Scared for my life" Diagnosis:  Final diagnoses:  Schizophrenia, paranoid ([facility])    History of Present illness: [name] is a 30 y.o. male with psychiatric history significant for paranoid schizophrenia who presents to [facility] for the second time in the past 7 hours for being "scared for my life".  Patient recently presented to [facility] on the evening of 01/29/21 for similar presentation.  Patient was assessed by myself on 01/29/21 at [facility] at that time and patient did not meet inpatient psychiatric treatment criteria for [facility] continuous observation/assessment criteria at that time.  Patient was discharged from [facility] at that time with instructions to call/follow up with his [facility] ACTT team for psychotropic medication management and therapy services. Please see my 01/29/21 MSE note for further details regarding my 01/29/21 assessment of this patient if necessary.   Patient states that he came back to [facility] because he is still "scared for my life" and that he needs a place to stay and money for food because he is homeless.  When patient is asked to describe this statement further, patient states "I don't know. I'll be alright" and cannot elaborate further on being scared for his life.  Patient states that no new/acute events occurred between his last discharge from [facility] and this current assessment.  Patient denies any SI, HI, AVH, or delusions on exam. He continues to endorse paranoia which appears to be chronic in nature/unchanged from previous presentations and assessment/stable (see my 01/29/21 MSE note for further details regarding this).  Patient can contract for his safety and states that if he is to leave [facility], he will not try to harm or kill himself.  Psychiatric Specialty Exam  Presentation  General Appearance:Appropriate  for Environment; Well Groomed  Eye Contact:Fair  Speech:Clear and Coherent; Normal Rate  Speech Volume:Normal  Handedness:  Mood and Affect  Mood:Anxious  Affect:Congruent   Thought Process  Thought Processes:Coherent; Goal Directed  Descriptions of Associations:Intact  Orientation:Full (Time, Place and Person)  Thought Content:Paranoid Ideation (Similar to past presentations and to presentation upon my previous assessment of the patient on 01/29/21.)  Diagnosis of Schizophrenia or Schizoaffective disorder in past: Yes  Duration of Psychotic Symptoms: Greater than six months  Hallucinations: Ideas of Reference:Paranoia (Similar to past presentations and to presentation upon my previous assessment of the patient on 01/29/21.)  Suicidal Thoughts:No  Homicidal Thoughts:No   Sensorium  Memory:Immediate Fair; Recent Fair; Remote Fair  Judgment:Fair  Insight:Fair   Executive Functions  Concentration:Fair  Attention Span:Fair  Recall:Fair  [employer] of Knowledge:Fair  Language:Fair   Psychomotor Activity  Psychomotor Activity:Normal   Assets  Assets:Communication Skills; Desire for Improvement; Financial Resources/Insurance; Leisure Time; Physical Health; Social Support   Sleep  Sleep:Fair  Number of hours:   Physical Exam: Physical Exam Vitals reviewed.  Constitutional:      General: He is not in acute distress.    Appearance: He is normal weight. He is not ill-appearing, toxic-appearing or diaphoretic.  HENT:     Head: Normocephalic and atraumatic.     Right Ear: External ear normal.     Left Ear: External ear normal.  Cardiovascular:     Rate and Rhythm: Normal rate.  Pulmonary:     Effort: Pulmonary effort is normal. No respiratory distress.  Musculoskeletal:        General: Normal range of motion.  Cervical back: Normal range of motion.  Neurological:     Mental Status: He is oriented to person, place, and time.     Comments: No tremor  noted on bilateral upper or lower extremities.   Psychiatric:        Attention and Perception: He does not perceive auditory or visual hallucinations.        Mood and Affect: Mood is anxious.        Speech: Speech normal.        Behavior: Behavior is not agitated, slowed, aggressive, withdrawn, hyperactive or combative. Behavior is cooperative.        Thought Content: Thought content is paranoid. Thought content is not delusional. Thought content does not include homicidal or suicidal ideation.     Comments: Affect mood congruent.    Review of Systems  Constitutional: Negative for chills, diaphoresis, fever, malaise/fatigue and weight loss.  HENT: Negative for congestion.   Respiratory: Negative for cough and shortness of breath.   Cardiovascular: Negative for chest pain and palpitations.  Gastrointestinal: Negative for abdominal pain, constipation, diarrhea, nausea and vomiting.  Musculoskeletal: Negative for joint pain and myalgias.  Neurological: Negative for dizziness and headaches.  Psychiatric/Behavioral: Negative for depression, hallucinations, memory loss, substance abuse and suicidal ideas. The patient is nervous/anxious. The patient does not have insomnia.   All other systems reviewed and are negative.    Vitals: Blood pressure (!) 133/96, pulse 98, temperature 98.6 F (37 C), temperature source Oral, resp. rate 18, SpO2 99 %. There is no height or weight on file to calculate BMI.  Musculoskeletal: Strength & Muscle Tone: within normal limits Gait & Station: normal Patient leans: N/A   [facility] MSE Discharge Disposition for Follow up and Recommendations: Based on my evaluation the patient does not appear to have an emergency medical condition and can be discharged with resources and follow up care in outpatient services for Medication Management and Individual Therapy  Patient is not suicidal, homicidal, or psychotic and still does not meet inpatient psychiatric treatment or  [facility] continuous observation/assessment criteria at this time.  Patient continues to endorse paranoia but similar to my past assessment, this still appears to be chronic in nature and not acutely affecting the patient at this time (please see my 01/29/21 assessment MSE note of the patient regarding this).  Communicated my similar recommendation from my 01/29/21 assessment to the patient a second time regarding that I recommended the patient contact/follow-up with his [name] team to receive his next Invega injection/have updates made to his home psychotropic medication regimen if necessary and to continue to receive his therapy services. Patient can contract for his safety.  Safety planning done at length with the patient regarding appropriate actions to take/resources to utilize [facility], nearest ED, 911, suicide prevention Lifeline) if the patient becomes suicidal or homicidal, if his condition rapidly deteriorates/worsens/does not improve, or if the patient begins to experience a mental health crisis.  This information was also included in the patient's AVS as well.   Patient verbalizes understanding and agreement of the overall plan including safety plan. All patient's questions answered and concerns addressed. Patient to be discharged to his destination of choice via [employer].    Jaclyn Shaggy, PA-C 01/30/2021, 4:43 AM

## 2021-01-30 NOTE — Discharge Instructions (Addendum)

## 2021-01-31 ENCOUNTER — Other Ambulatory Visit: Payer: Self-pay

## 2021-01-31 ENCOUNTER — Emergency Department (HOSPITAL_COMMUNITY): Payer: Medicaid Other

## 2021-01-31 ENCOUNTER — Encounter (HOSPITAL_COMMUNITY): Payer: Self-pay

## 2021-01-31 ENCOUNTER — Emergency Department (HOSPITAL_COMMUNITY)
Admission: EM | Admit: 2021-01-31 | Discharge: 2021-02-01 | Disposition: A | Discharge status: Home / Self Care | Payer: Medicaid Other | Attending: Emergency Medicine | Admitting: Emergency Medicine

## 2021-01-31 DIAGNOSIS — R0602 Shortness of breath: Secondary | ICD-10-CM | POA: Diagnosis present

## 2021-01-31 DIAGNOSIS — F17210 Nicotine dependence, cigarettes, uncomplicated: Secondary | ICD-10-CM | POA: Diagnosis not present

## 2021-01-31 DIAGNOSIS — Z20822 Contact with and (suspected) exposure to covid-19: Secondary | ICD-10-CM | POA: Insufficient documentation

## 2021-01-31 LAB — CBC WITH DIFFERENTIAL/PLATELET
Abs Immature Granulocytes: 0.01 10*3/uL (ref 0.00–0.07)
Basophils Absolute: 0 10*3/uL (ref 0.0–0.1)
Basophils Relative: 1 %
Eosinophils Absolute: 0.4 10*3/uL (ref 0.0–0.5)
Eosinophils Relative: 7 %
HCT: 43.4 % (ref 39.0–52.0)
Hemoglobin: 14.9 g/dL (ref 13.0–17.0)
Immature Granulocytes: 0 %
Lymphocytes Relative: 36 %
Lymphs Abs: 2.2 10*3/uL (ref 0.7–4.0)
MCH: 31.2 pg (ref 26.0–34.0)
MCHC: 34.3 g/dL (ref 30.0–36.0)
MCV: 90.8 fL (ref 80.0–100.0)
Monocytes Absolute: 0.6 10*3/uL (ref 0.1–1.0)
Monocytes Relative: 9 %
Neutro Abs: 2.9 10*3/uL (ref 1.7–7.7)
Neutrophils Relative %: 47 %
Platelets: 235 10*3/uL (ref 150–400)
RBC: 4.78 MIL/uL (ref 4.22–5.81)
RDW: 13 % (ref 11.5–15.5)
WBC: 6.1 10*3/uL (ref 4.0–10.5)
nRBC: 0 % (ref 0.0–0.2)

## 2021-01-31 LAB — COMPREHENSIVE METABOLIC PANEL
ALT: 14 U/L (ref 0–44)
AST: 26 U/L (ref 15–41)
Albumin: 4.2 g/dL (ref 3.5–5.0)
Alkaline Phosphatase: 43 U/L (ref 38–126)
Anion gap: 10 (ref 5–15)
BUN: 17 mg/dL (ref 6–20)
CO2: 28 mmol/L (ref 22–32)
Calcium: 9.2 mg/dL (ref 8.9–10.3)
Chloride: 102 mmol/L (ref 98–111)
Creatinine, Ser: 1.25 mg/dL — ABNORMAL HIGH (ref 0.61–1.24)
GFR, Estimated: >60 mL/min (ref >60)
Glucose, Bld: 88 mg/dL (ref 70–99)
Potassium: 3.3 mmol/L — ABNORMAL LOW (ref 3.5–5.1)
Sodium: 140 mmol/L (ref 135–145)
Total Bilirubin: 0.8 mg/dL (ref 0.3–1.2)
Total Protein: 6.9 g/dL (ref 6.5–8.1)

## 2021-01-31 LAB — RESP PANEL BY RT-PCR (FLU A&B, COVID) ARPGX2
Influenza A by PCR: NEGATIVE
Influenza B by PCR: NEGATIVE
SARS Coronavirus 2 by RT PCR: NEGATIVE

## 2021-01-31 LAB — SALICYLATE LEVEL: Salicylate Lvl: <7 mg/dL — ABNORMAL LOW (ref 7.0–30.0)

## 2021-01-31 LAB — ETHANOL: Alcohol, Ethyl (B): <10 mg/dL (ref <10)

## 2021-01-31 LAB — D-DIMER, QUANTITATIVE: D-Dimer, Quant: <0.27 ug/mL-FEU (ref 0.00–0.50)

## 2021-01-31 LAB — ACETAMINOPHEN LEVEL: Acetaminophen (Tylenol), Serum: <10 ug/mL — ABNORMAL LOW (ref 10–30)

## 2021-01-31 LAB — CBG MONITORING, ED: Glucose-Capillary: 86 mg/dL (ref 70–99)

## 2021-01-31 MED ORDER — PREDNISONE 20 MG PO TABS
60.0000 mg | ORAL_TABLET | Freq: Once | ORAL | Status: AC
  Administered 2021-01-31: 60 mg via ORAL
  Filled 2021-01-31: qty 3

## 2021-01-31 MED ORDER — IPRATROPIUM-ALBUTEROL 0.5-2.5 (3) MG/3ML IN SOLN
5.0000 mL | Freq: Once | RESPIRATORY_TRACT | Status: AC
  Administered 2021-01-31: 5 mL via RESPIRATORY_TRACT
  Filled 2021-01-31: qty 3

## 2021-01-31 NOTE — ED Provider Notes (Signed)
[facility]   CSN: [id number] Arrival date & time: 01/31/21  2155     History Chief Complaint  Patient presents with  . Shortness of Breath    [name] is a 30 y.o. male.  Patient with past medical history notable for depression and schizophrenia presents to the emergency department with a chief complaint of shortness of breath.  He is brought in by EMS with 2 to 3 days of shortness of breath.  Denies any history of asthma.  Per EMS, was initially 87% on room air.  Patient was given 10 mg albuterol prior to arrival with improvement to 97%.  No reported wheezing.  Patient denies being in any pain, but states that he "does not feel right."  He states he does not know if he is used any drugs or alcohol.  He denies any cough or fever.  The history is provided by the patient. No language interpreter was used.       Past Medical History:  Diagnosis Date  . Depression   . Schizophrenia [facility])     Patient Active Problem List   Diagnosis Date Noted  . 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 Schizophrenia, paranoid type (HCC) 10/24/2020  . Paranoid schizophrenia (HCC) 01/05/2020  . Other schizophrenia (HCC) 02/14/2018  . Cannabis use disorder, moderate, dependence (HCC) 05/10/2016    Past Surgical History:  Procedure Laterality Date  . BACK SURGERY         History reviewed. No pertinent family history.  Social History   Tobacco Use  . Smoking status: Current Some Day Smoker    Types: Cigarettes  . Smokeless tobacco: Never Used  Vaping Use  . Vaping Use: Never used  Substance Use Topics  . Alcohol use: No  . Drug use: Yes    Types: Marijuana    Comment: rare    Home Medications Prior to Admission medications   Medication Sig Start Date End Date Taking? Authorizing Provider  hydrOXYzine (ATARAX/VISTARIL) 25 MG tablet Take 1 tablet (25 mg total) by mouth every 6 (six) hours as needed for anxiety. Patient  not taking: Reported on 01/29/2021 01/26/21   Mesner, Barbara Cower, MD  paliperidone (INVEGA SUSTENNA) 156 MG/ML SUSY injection Inject 156 mg into the muscle every 28 (twenty-eight) days. Patient's last dose was on 01/16/21.    [provider]  paliperidone (INVEGA) 6 MG 24 hr tablet Take 1 tablet (6 mg total) by mouth daily for 14 days. Patient not taking: Reported on 01/22/2021 01/02/21 01/16/21  Mare Ferrari, PA-C    Allergies    Shellfish allergy and Bee pollen  Review of Systems   Review of Systems  All other systems reviewed and are negative.   Physical Exam Updated Vital Signs BP (!) 142/91 (BP Location: Left Arm)   Pulse 98   Temp 98.3 F (36.8 C) (Oral)   Resp (!) 25   Ht 5\' 5"  (1.651 m)   Wt 74.8 kg   SpO2 (!) 87%   BMI 27.46 kg/m   Physical Exam Vitals and nursing note reviewed.  Constitutional:      Appearance: He is well-developed.  HENT:     Head: Normocephalic and atraumatic.  Eyes:     Conjunctiva/sclera: Conjunctivae normal.  Cardiovascular:     Rate and Rhythm: Normal rate and regular rhythm.     Heart sounds: No murmur heard.   Pulmonary:     Effort: Pulmonary effort is normal. No respiratory distress.  Breath sounds: Normal breath sounds. No stridor. No wheezing, rhonchi or rales.  Abdominal:     Palpations: Abdomen is soft.     Tenderness: There is no abdominal tenderness.  Musculoskeletal:        General: Normal range of motion.     Cervical back: Neck supple.  Skin:    General: Skin is warm and dry.  Neurological:     Mental Status: He is alert and oriented to person, place, and time.  Psychiatric:        Mood and Affect: Mood normal.        Behavior: Behavior normal.     ED Results / Procedures / Treatments   Labs (all labs ordered are listed, but only abnormal results are displayed) Labs Reviewed - No data to display  EKG None  Radiology No results found.  Procedures Procedures   Medications Ordered in ED Medications  - No data to display  ED Course  I have reviewed the triage vital signs and the nursing notes.  Pertinent labs & imaging results that were available during my care of the patient were reviewed by me and considered in my medical decision making (see chart for details).    MDM Rules/Calculators/A&P                          This patient complains of shortness of breath, this involves an extensive number of treatment options, and is a complaint that carries with it a high risk of complications and morbidity.    Differential Dx Asthma, covid, pneumonia, PE  Pertinent Labs I ordered, reviewed, and interpreted labs, which included CBC shows no anemia, no leukocytosis, potassium is 3.3, glucose 86, COVID-negative.  BNP is 24.9, D-dimer is negative.  Doubt PE.  Doubt heart failure.  No wheezing after albuterol, I was not able to listen to the patient prior to him receiving the albuterol by EMS.  Imaging Interpretation I ordered imaging studies which included chest x-ray.  I independently visualized and interpreted the chest x-ray, which showed no obvious abnormality.   Medications I ordered medication albuterol and prednisone for suspected bronchospasm.  Reassessments After the interventions stated above, I reevaluated the patient and found the patient improved.  No wheezing.  Maintaining greater than 90% pulse oxygenation.  No respiratory distress.  Patient ambulates maintaining greater than 94% pulse oxygenation.  He is asking for something to eat and drink.  Consultants None  Plan Discharge with outpatient follow-up.    Final Clinical Impression(s) / ED Diagnoses Final diagnoses:  SOB (shortness of breath)    Rx / DC Orders ED Discharge Orders    None       Roxy Horseman, PA-C 02/01/21 0113    Linwood Dibbles, MD 02/04/21 [phone]

## 2021-01-31 NOTE — ED Triage Notes (Signed)
Pt presents via [facility], reports SOB x 2-3 days. Denies known history of asthma but fire states patient was initially 87% on room air, placed on NRB and given 10mg  albuterol PTA with improvement to 97% on room air. When asking patient about symptoms and how he feels patient repetitively stating 'I don't know I just need help'. Hx schizophrenia, denies SI/HI

## 2021-02-01 LAB — BRAIN NATRIURETIC PEPTIDE: B Natriuretic Peptide: 24.9 pg/mL (ref 0.0–100.0)

## 2021-02-01 MED ORDER — ALBUTEROL SULFATE HFA 108 (90 BASE) MCG/ACT IN AERS
2.0000 | INHALATION_SPRAY | RESPIRATORY_TRACT | Status: DC | PRN
  Administered 2021-02-01: 2 via RESPIRATORY_TRACT
  Filled 2021-02-01: qty 6.7

## 2021-02-01 MED ORDER — POTASSIUM CHLORIDE CRYS ER 20 MEQ PO TBCR
40.0000 meq | EXTENDED_RELEASE_TABLET | Freq: Once | ORAL | Status: DC
  Filled 2021-02-01: qty 2

## 2021-02-01 NOTE — ED Notes (Signed)
Pt ambulated. SPO2 between 94-97%.

## 2021-02-07 ENCOUNTER — Other Ambulatory Visit: Payer: Self-pay

## 2021-02-07 ENCOUNTER — Emergency Department (HOSPITAL_COMMUNITY)
Admission: EM | Admit: 2021-02-07 | Discharge: 2021-02-07 | Disposition: A | Discharge status: Home / Self Care | Payer: Medicaid Other | Attending: Emergency Medicine | Admitting: Emergency Medicine

## 2021-02-07 ENCOUNTER — Ambulatory Visit (HOSPITAL_COMMUNITY)
Admission: EM | Admit: 2021-02-07 | Discharge: 2021-02-08 | Disposition: A | Discharge status: Home / Self Care | Payer: Medicaid Other | Source: Home / Self Care

## 2021-02-07 ENCOUNTER — Encounter (HOSPITAL_COMMUNITY): Payer: Self-pay | Admitting: Emergency Medicine

## 2021-02-07 DIAGNOSIS — F200 Paranoid schizophrenia: Secondary | ICD-10-CM | POA: Insufficient documentation

## 2021-02-07 DIAGNOSIS — Z79899 Other long term (current) drug therapy: Secondary | ICD-10-CM | POA: Insufficient documentation

## 2021-02-07 DIAGNOSIS — F419 Anxiety disorder, unspecified: Secondary | ICD-10-CM | POA: Diagnosis not present

## 2021-02-07 DIAGNOSIS — F600 Paranoid personality disorder: Secondary | ICD-10-CM | POA: Diagnosis not present

## 2021-02-07 DIAGNOSIS — F209 Schizophrenia, unspecified: Secondary | ICD-10-CM

## 2021-02-07 DIAGNOSIS — F17210 Nicotine dependence, cigarettes, uncomplicated: Secondary | ICD-10-CM | POA: Diagnosis not present

## 2021-02-07 DIAGNOSIS — R062 Wheezing: Secondary | ICD-10-CM | POA: Insufficient documentation

## 2021-02-07 MED ORDER — ALBUTEROL SULFATE HFA 108 (90 BASE) MCG/ACT IN AERS
2.0000 | INHALATION_SPRAY | Freq: Once | RESPIRATORY_TRACT | Status: AC
  Administered 2021-02-07: 2 via RESPIRATORY_TRACT
  Filled 2021-02-07: qty 6.7

## 2021-02-07 NOTE — ED Provider Notes (Signed)
[facility] Provider Note   CSN: [id number] Arrival date & time: 02/07/21  0009     History Chief Complaint  Patient presents with  . Paranoid    [name] is a 30 y.o. male.  The history is provided by the patient.  Illness Location:  Sleeping and has to be awoken to discuss his anxiety and paranoia Quality:  States he "needs a chill pill"  Severity:  Moderate Onset quality:  Gradual Timing:  Constant Progression:  Unchanged Chronicity:  Chronic Context:  Schizophrenia Relieved by:  Nothing Worsened by:  Nothing Ineffective treatments:  None Associated symptoms: wheezing   Associated symptoms: no abdominal pain, no chest pain, no congestion, no cough, no diarrhea, no ear pain, no fatigue, no fever, no headaches, no loss of consciousness, no myalgias, no nausea, no rash, no rhinorrhea, no shortness of breath, no sore throat and no vomiting   Associated symptoms comment:  No SI no HI, no AH no VH Risk factors:  Schizophrenia       Past Medical History:  Diagnosis Date  . Depression   . Schizophrenia [facility])     Patient Active Problem List   Diagnosis Date Noted  . 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 Schizophrenia, paranoid type (HCC) 10/24/2020  . Paranoid schizophrenia (HCC) 01/05/2020  . Other schizophrenia (HCC) 02/14/2018  . Cannabis use disorder, moderate, dependence (HCC) 05/10/2016    Past Surgical History:  Procedure Laterality Date  . BACK SURGERY         History reviewed. No pertinent family history.  Social History   Tobacco Use  . Smoking status: Current Some Day Smoker    Types: Cigarettes  . Smokeless tobacco: Never Used  Vaping Use  . Vaping Use: Never used  Substance Use Topics  . Alcohol use: No  . Drug use: Yes    Types: Marijuana    Comment: rare    Home Medications Prior to Admission medications   Medication Sig Start Date End Date Taking? Authorizing Provider  hydrOXYzine  (ATARAX/VISTARIL) 25 MG tablet Take 1 tablet (25 mg total) by mouth every 6 (six) hours as needed for anxiety. Patient not taking: No sig reported 01/26/21   Mesner, Barbara Cower, MD  paliperidone (INVEGA SUSTENNA) 156 MG/ML SUSY injection Inject 156 mg into the muscle every 28 (twenty-eight) days. Patient's last dose was on 01/16/21. Patient not taking: Reported on 02/01/2021    [provider]  paliperidone (INVEGA) 6 MG 24 hr tablet Take 1 tablet (6 mg total) by mouth daily for 14 days. Patient not taking: No sig reported 01/02/21 01/16/21  Mare Ferrari, PA-C    Allergies    Shellfish allergy and Bee pollen  Review of Systems   Review of Systems  Constitutional: Negative for fatigue and fever.  HENT: Negative for congestion, ear pain, rhinorrhea and sore throat.   Eyes: Negative for visual disturbance.  Respiratory: Positive for wheezing. Negative for cough and shortness of breath.        On exam   Cardiovascular: Negative for chest pain.  Gastrointestinal: Negative for abdominal pain, diarrhea, nausea and vomiting.  Genitourinary: Negative for difficulty urinating.  Musculoskeletal: Negative for myalgias.  Skin: Negative for rash.  Neurological: Negative for loss of consciousness and headaches.  Psychiatric/Behavioral: Negative for agitation, hallucinations and suicidal ideas. The patient is not hyperactive.   All other systems reviewed and are negative.   Physical Exam Updated Vital Signs BP 106/70   Pulse 68   Temp  98.1 F (36.7 C) (Oral)   Resp 16   Ht 5\' 5"  (1.651 m)   Wt 74.8 kg   SpO2 93%   BMI 27.46 kg/m   Physical Exam Vitals and nursing note reviewed.  Constitutional:      General: He is not in acute distress.    Appearance: Normal appearance.  HENT:     Head: Normocephalic and atraumatic.     Nose: Nose normal.     Mouth/Throat:     Mouth: Mucous membranes are moist.     Pharynx: Oropharynx is clear.  Eyes:     Extraocular Movements: Extraocular  movements intact.     Conjunctiva/sclera: Conjunctivae normal.     Pupils: Pupils are equal, round, and reactive to light.  Cardiovascular:     Rate and Rhythm: Normal rate and regular rhythm.     Pulses: Normal pulses.     Heart sounds: Normal heart sounds.  Pulmonary:     Breath sounds: Wheezing present.  Abdominal:     General: Abdomen is flat. Bowel sounds are normal.     Palpations: Abdomen is soft.  Musculoskeletal:        General: Normal range of motion.     Cervical back: Normal range of motion and neck supple.  Skin:    General: Skin is warm and dry.     Capillary Refill: Capillary refill takes less than 2 seconds.  Neurological:     General: No focal deficit present.     Mental Status: He is alert and oriented to person, place, and time.     Deep Tendon Reflexes: Reflexes normal.  Psychiatric:        Mood and Affect: Mood normal.        Behavior: Behavior normal.     ED Results / Procedures / Treatments   Labs (all labs ordered are listed, but only abnormal results are displayed) Labs Reviewed - No data to display  EKG None  Radiology No results found.  Procedures Procedures   Medications Ordered in ED Medications  albuterol (VENTOLIN HFA) 108 (90 Base) MCG/ACT inhaler 2 puff (has no administration in time range)    ED Course  I have reviewed the triage vital signs and the nursing notes.  Pertinent labs & imaging results that were available during my care of the patient were reviewed by me and considered in my medical decision making (see chart for details).    As the patient is wheezing on exam will given albuterol in the ED.  The patient is not "getting a chill pill" and has not acute psychiatric emergency.  He is now stable for discharge with close follow up.  [name] was evaluated in Emergency Department on 02/07/2021 for the symptoms described in the history of present illness. He was evaluated in the context of the global COVID-19 pandemic,  which necessitated consideration that the patient might be at risk for infection with the SARS-CoV-2 virus that causes COVID-19. Institutional protocols and algorithms that pertain to the evaluation of patients at risk for COVID-19 are in a state of rapid change based on information released by regulatory bodies including the CDC and federal and state organizations. These policies and algorithms were followed during the patient's care in the ED.  Final Clinical Impression(s) / ED Diagnoses Final diagnoses:  Schizophrenia, unspecified type (HCC)  Wheezing   Return for intractable cough, coughing up blood, fevers >100.4 unrelieved by medication, shortness of breath, intractable vomiting, chest pain, shortness of breath, weakness,  numbness, changes in speech, facial asymmetry, abdominal pain, passing out, Inability to tolerate liquids or food, cough, altered mental status or any concerns. No signs of systemic illness or infection. The patient is nontoxic-appearing on exam and vital signs are within normal limits.  I have reviewed the triage vital signs and the nursing notes. Pertinent labs & imaging results that were available during my care of the patient were reviewed by me and considered in my medical decision making (see chart for details). After history, exam, and medical workup I feel the patient has been appropriately medically screened and is safe for discharge home. Pertinent diagnoses were discussed with the patient. Patient was given return precautions.      Adrian Fuller, MD 02/07/21 2440

## 2021-02-07 NOTE — ED Notes (Signed)
PT state he leaving. PT seen leaving out front door going into lobby.

## 2021-02-07 NOTE — BH Assessment (Signed)
TRIAGE: ROUTINE  [name] arrived [facility]) due to concerns that he needs to be on medication; he states he is not currently on medication at this time. Pt states, "I need to be put on medicine so I can focus on reality. My mind's messed up."  Pt denies SI or a hx of SI. He denies he's ever attempted to kill himself or that he has a plan to kill himself. Pt denies HI, AVH, NSSIB, access to guns/weapons, or engagement with the legal system. Pt shared that he smokes week but was unable to identify how much he uses or how frequently he uses it.

## 2021-02-07 NOTE — ED Notes (Signed)
Patient in lobby asking for food. Discussed with patient that food is for the patients, and we are unable to feed him until he is evaluated. Patient agreeable to come back to room. Patient states, "Sorry, y'all are just taking too long." Patient redirected back to triage room.

## 2021-02-07 NOTE — ED Notes (Signed)
Patient left before DC instruction to be given

## 2021-02-07 NOTE — ED Triage Notes (Addendum)
Patient arrives stating he fears for his life. Patient unable to give more information, just states, "I don't know." Denies SI/HI, hx paranoid schizophrenia

## 2021-02-07 NOTE — ED Notes (Signed)
Went to call patient for triage, noted to not be in the lobby, will recheck

## 2021-02-07 NOTE — ED Notes (Signed)
Patient proceeded out of room to nurses station. Patient states, "I think I want to go." Patient is not SI/HI or experiencing AVH. Discussed with patient seeing the doctor before leaving. Patient continued to reply, "I don't know, I'm scared." Patient left triage when triage doors opened.

## 2021-02-07 NOTE — ED Notes (Signed)
Patient found outside of room. Patient stating that he is leaving because we are not giving him the medications he needs and we are taking too long. Patient left department and was witnessed leaving lobby.

## 2021-02-07 NOTE — ED Notes (Signed)
Gave patient a coke and sandwich

## 2021-02-07 NOTE — Discharge Instructions (Addendum)

## 2021-02-08 ENCOUNTER — Emergency Department (HOSPITAL_COMMUNITY)
Admission: EM | Admit: 2021-02-08 | Discharge: 2021-02-08 | Disposition: A | Discharge status: Home / Self Care | Payer: Medicaid Other | Attending: Emergency Medicine | Admitting: Emergency Medicine

## 2021-02-08 ENCOUNTER — Other Ambulatory Visit: Payer: Self-pay

## 2021-02-08 ENCOUNTER — Encounter (HOSPITAL_COMMUNITY): Payer: Self-pay

## 2021-02-08 ENCOUNTER — Emergency Department (HOSPITAL_COMMUNITY)
Admission: EM | Admit: 2021-02-08 | Discharge: 2021-02-08 | Discharge status: Against Medical Advice | Payer: Medicaid Other | Source: Home / Self Care | Attending: Emergency Medicine | Admitting: Emergency Medicine

## 2021-02-08 ENCOUNTER — Ambulatory Visit (HOSPITAL_COMMUNITY)
Admission: RE | Admit: 2021-02-08 | Discharge: 2021-02-08 | Disposition: A | Discharge status: Home / Self Care | Payer: Medicaid Other | Attending: Psychiatry | Admitting: Psychiatry

## 2021-02-08 DIAGNOSIS — F419 Anxiety disorder, unspecified: Secondary | ICD-10-CM | POA: Insufficient documentation

## 2021-02-08 DIAGNOSIS — Z5321 Procedure and treatment not carried out due to patient leaving prior to being seen by health care provider: Secondary | ICD-10-CM | POA: Insufficient documentation

## 2021-02-08 DIAGNOSIS — F200 Paranoid schizophrenia: Secondary | ICD-10-CM | POA: Diagnosis not present

## 2021-02-08 DIAGNOSIS — Z0000 Encounter for general adult medical examination without abnormal findings: Secondary | ICD-10-CM

## 2021-02-08 DIAGNOSIS — R6883 Chills (without fever): Secondary | ICD-10-CM | POA: Diagnosis present

## 2021-02-08 DIAGNOSIS — Z049 Encounter for examination and observation for unspecified reason: Secondary | ICD-10-CM | POA: Diagnosis present

## 2021-02-08 DIAGNOSIS — F17210 Nicotine dependence, cigarettes, uncomplicated: Secondary | ICD-10-CM | POA: Diagnosis not present

## 2021-02-08 NOTE — ED Triage Notes (Signed)
Pt reports he is here today due to fearing his life. Pt unable to give more information.pt denies any SI or HI.

## 2021-02-08 NOTE — ED Provider Notes (Signed)
[facility] Medical Screening Exam  Patient Name: [name] MRN: [id number] Date of Evaluation: 02/08/21 Chief Complaint:   Diagnosis:  Final diagnoses:  Paranoid schizophrenia (HCC)    History of Present illness: [name] is a 30 y.o. male. Patient presented to [facility] voluntarily with chief complaint of "I need my medication, I'm hungry and I need to sleep."   Per chart review, patient gets [place] 156mg  IM. He was administered this medication on 01/16/2021 and was to follow up with his ACTT Team. This medication is prescribed to be administered Q28 days.   Patient assessed face to face this NP. Patient is alert and oriented X4, he is calm and cooperative, he appears to be at this baseline (patient is well know to this service), he denies illicit drug and alcohol use within the past 24hrs.   He denies SI, HI, and states "I don't know" when he was asked about hallucinations. Patient denies paranoia and no indication of delusion noted. Patient does not appear to be actively psychotic. Patient is at baseline.  Patient was given PPJ sandwich and discharge. Patient denies any psychiatric concerns.   Psychiatric Specialty Exam  Presentation  General Appearance:Appropriate for Environment  Eye Contact:Fair  Speech:Clear and Coherent  Speech Volume:Normal  Handedness:Right   Mood and Affect  Mood:Euthymic  Affect:Congruent   Thought Process  Thought Processes:Coherent; Goal Directed  Descriptions of Associations:Intact  Orientation:Full (Time, Place and Person)  Thought Content:WDL  Diagnosis of Schizophrenia or Schizoaffective disorder in past: Yes  Duration of Psychotic Symptoms: Greater than six months  Hallucinations:None (Patient states "I don't know" when asked about hallucinations) Patient endorses auditory hallucinations but is unable to make out the voices  Ideas of Reference:None  Suicidal Thoughts:No  Homicidal  Thoughts:No   Sensorium  Memory:Immediate Fair; Recent Fair; Remote Poor  Judgment:Poor  Insight:Fair   Executive Functions  Concentration:Fair  Attention Span:Fair  Recall:Fair  Fund of Knowledge:Fair  Language:Fair   Psychomotor Activity  Psychomotor Activity:Normal   Assets  Assets:Desire for Improvement; Physical Health   Sleep  Sleep:Fair  Number of hours: 2   No data recorded  Physical Exam: Physical Exam Vitals and nursing note reviewed.  Constitutional:      Appearance: He is well-developed.  HENT:     Head: Normocephalic and atraumatic.  Eyes:     Conjunctiva/sclera: Conjunctivae normal.  Cardiovascular:     Rate and Rhythm: Normal rate and regular rhythm.     Heart sounds: No murmur heard.   Pulmonary:     Effort: Pulmonary effort is normal. No respiratory distress.     Breath sounds: Normal breath sounds.  Abdominal:     Palpations: Abdomen is soft.     Tenderness: There is no abdominal tenderness.  Musculoskeletal:     Cervical back: Neck supple.  Skin:    General: Skin is warm and dry.  Neurological:     Mental Status: He is alert and oriented to person, place, and time.  Psychiatric:        Attention and Perception: Attention normal.        Mood and Affect: Mood and affect normal.        Speech: Speech normal.        Behavior: Behavior normal. Behavior is cooperative.        Thought Content: Thought content normal.    Review of Systems  Constitutional: Negative.   HENT: Negative.   Eyes: Negative.   Respiratory: Negative.   Cardiovascular: Negative.  Gastrointestinal: Negative.   Genitourinary: Negative.   Musculoskeletal: Negative.   Skin: Negative.   Neurological: Negative.   Endo/Heme/Allergies: Negative.   Psychiatric/Behavioral: Negative for depression, hallucinations, substance abuse and suicidal ideas.   Blood pressure 111/77, pulse 98, temperature 97.9 F (36.6 C), temperature source Oral, resp. rate 20,  SpO2 96 %. There is no height or weight on file to calculate BMI.  Musculoskeletal: Strength & Muscle Tone: within normal limits Gait & Station: normal Patient leans: Right   [facility] MSE Discharge Disposition for Follow up and Recommendations: Based on my evaluation the patient does not appear to have an emergency medical condition and can be discharged with resources and follow up care in outpatient services for Medication Management and Individual Therapy   Maricela Bo, NP 02/08/2021, 5:30 AM

## 2021-02-08 NOTE — H&P (Addendum)
Behavioral Health Medical Screening Exam  [name] is a 30 y.o. male who presents to the [facility] voluntarily as a walk-in with a chief complaint of "fear for my life."  The patient has a significant past psychiatric history of paranoid schizophrenia and several visits to the [facility] EDs and behavioral health services in the past six months with similar presentation. Prior to arrival at [facility] tonight, the patient was evaluated today, 02/08/21 at [facility] and [name] with similar complaints.    Patient reports " fear for my life." He is unable to explain why he feels fearful for his life. He is unable to identify triggers, stressors, or events attributing to why he is feeling fearful. He answers the assessment questions with "I don't know" responses. When asked about his hospital encounters today on 02/08/21 at [facility] and [name], he stated "I don't know."   On approach, the patient is alert and oriented x 3. His speech is coherent, tone is at a normal rate and his thought process is somewhat logical when he does answer questions. He is calm and cooperative during the assessment. He is dressed casually in jeans and a t-shirt. He denies suicidal and homicidal ideations. He denies auditory and visual hallucinations. He does not appear to be responding to internal, or external stimuli. He denies feeling paranoid, however he presents with paranoid thoughts that are chronic in nature. He denies using illicit drugs, including THC, heroin, or cocaine. He denies alcohol use. He denies having an ACT team. He denies taking medications for his mental health and states that he doesn't need medications. He states that he is homeless and denies having an apartment. He states that he wants to go to sleep and requested to have a blanket. When he was advised of the discharge plan to continue to follow up with his Act team and be released tonight, he asked for a ride to "[address] He then stated that he needed to go  home and felt tired. The patient was provided with a lunch tray and a taxi voucher at his request.   Patient declined for this provider, or the TTS counselor to obtain collateral information from his aunt [name] [phone].   Per chart review: Patient received Invega Sustenna 156 mg IM (every 28 days) on 01/16/21 at [facility].     Total Time spent with patient: 15 minutes  Psychiatric Specialty Exam:  Presentation  General Appearance: Appropriate for Environment  Eye Contact:Fair  Speech:Clear and Coherent  Speech Volume:Normal  Handedness:Right   Mood and Affect  Mood:Euthymic  Affect:Congruent   Thought Process  Thought Processes:Coherent  Descriptions of Associations:Intact  Orientation:Full (Time, Place and Person)  Thought Content:Logical  History of Schizophrenia/Schizoaffective disorder:Yes  Duration of Psychotic Symptoms:Greater than six months  Hallucinations:Hallucinations: None  Ideas of Reference:None  Suicidal Thoughts:Suicidal Thoughts: No  Homicidal Thoughts:Homicidal Thoughts: No   Sensorium  Memory:Immediate Fair; Recent Fair; Remote Poor  Judgment:Intact  Insight:Present   Executive Functions  Concentration:Fair  Attention Span:Fair  Recall:Poor  Fund of Knowledge:Fair  Language:Fair   Psychomotor Activity  Psychomotor Activity:Psychomotor Activity: Restlessness   Assets  Assets:Leisure Time; Physical Health   Sleep  Sleep:Sleep: Fair    Physical Exam: Physical Exam Constitutional:      Appearance: Normal appearance.  HENT:     Head: Normocephalic and atraumatic.     Nose: Nose normal.  Cardiovascular:     Rate and Rhythm: Normal rate.     Pulses: Normal pulses.  Pulmonary:     Effort: Pulmonary effort is normal.     Breath sounds: Normal breath sounds.  Musculoskeletal:        General: Normal range of motion.     Cervical back: Normal range of motion.  Neurological:     General: No focal deficit  present.     Mental Status: He is alert and oriented to person, place, and time.  Psychiatric:        Mood and Affect: Mood normal.        Behavior: Behavior normal.    Review of Systems  Constitutional: Negative.   HENT: Negative.   Eyes: Negative.   Respiratory: Negative.   Cardiovascular: Negative.   Skin: Negative.   Neurological: Negative.   Endo/Heme/Allergies: Negative.   Psychiatric/Behavioral: Negative for depression, hallucinations, memory loss, substance abuse and suicidal ideas. The patient is not nervous/anxious and does not have insomnia.        "fear for life."    Blood pressure 120/77, pulse 75, temperature 98.6 F (37 C), temperature source Oral, SpO2 100 %. There is no height or weight on file to calculate BMI.  Musculoskeletal: Strength & Muscle Tone: within normal limits Gait & Station: normal Patient leans: N/A   Recommendations:  Based on my evaluation the patient does not appear to have an emergency medical condition.  Disposition:  No evidence of imminent risk to self or others at present. No acute psychosis observed.  Patient does not meet criteria for psychiatric inpatient admission. Supportive therapy provided about ongoing stressors and this provider encouraged the patient to follow up with this ACT team. Discussed crisis plan, support from social network, calling 911, coming to the Emergency Department or [facility] Ur[facility] and calling the Suicide Hotline. Patient declined outpatient resources when offered by provider.  Patient requested transportation to [address] Patient was provided with a taxi voucher to the stated address.   Layla Barter, NP 02/08/2021, 11:53 PM

## 2021-02-08 NOTE — H&P (Incomplete)
Behavioral Health Medical Screening Exam  [name] is a 30 y.o. male.who presents to the [facility] as a walk-in voluntarily with complaints of feeling "scared for my life."  The patient has a psychiatric history of paranoid schizophrenia. Per chart review, he's had approximately 19 visits to the ED and behavioral health services in the past six months with similar presentations. Prior to arrival at [facility], the patient was evaluated and treated at [facility] on 01/26/21 at 10:04 pm.  Total Time spent with patient: 15 minutes  Psychiatric Specialty Exam:  Presentation  General Appearance: Appropriate for Environment  Eye Contact:Fair  Speech:Clear and Coherent  Speech Volume:Normal  Handedness:Right   Mood and Affect  Mood:Euthymic  Affect:Congruent   Thought Process  Thought Processes:Coherent  Descriptions of Associations:Intact  Orientation:Full (Time, Place and Person)  Thought Content:Logical  History of Schizophrenia/Schizoaffective disorder:Yes  Duration of Psychotic Symptoms:Greater than six months  Hallucinations:Hallucinations: None  Ideas of Reference:None  Suicidal Thoughts:Suicidal Thoughts: No  Homicidal Thoughts:Homicidal Thoughts: No   Sensorium  Memory:Immediate Fair; Recent Fair; Remote Poor  Judgment:Intact  Insight:Present   Executive Functions  Concentration:Fair  Attention Span:Fair  Recall:Poor  Fund of Knowledge:Fair  Language:Fair   Psychomotor Activity  Psychomotor Activity:Psychomotor Activity: Restlessness   Assets  Assets:Leisure Time; Physical Health   Sleep  Sleep:Sleep: Fair    Physical Exam: Physical Exam Constitutional:      Appearance: Normal appearance.  HENT:     Head: Normocephalic and atraumatic.     Nose: Nose normal.  Cardiovascular:     Rate and Rhythm: Normal rate.     Pulses: Normal pulses.  Pulmonary:     Effort: Pulmonary effort is normal.     Breath sounds: Normal  breath sounds.  Musculoskeletal:        General: Normal range of motion.     Cervical back: Normal range of motion.  Neurological:     General: No focal deficit present.     Mental Status: He is alert and oriented to person, place, and time.  Psychiatric:        Mood and Affect: Mood normal.        Behavior: Behavior normal.    Review of Systems  Constitutional: Negative.   HENT: Negative.   Eyes: Negative.   Respiratory: Negative.   Cardiovascular: Negative.   Skin: Negative.   Neurological: Negative.   Endo/Heme/Allergies: Negative.   Psychiatric/Behavioral: Negative for depression, hallucinations, memory loss, substance abuse and suicidal ideas. The patient is not nervous/anxious and does not have insomnia.        "fear for life."    Blood pressure 120/77, pulse 75, temperature 98.6 F (37 C), temperature source Oral, SpO2 100 %. There is no height or weight on file to calculate BMI.  Musculoskeletal: Strength & Muscle Tone: within normal limits Gait & Station: normal Patient leans: N/A   Recommendations:  Based on my evaluation the patient does not appear to have an emergency medical condition.  Disposition:  No evidence of imminent risk to self or others at present. No psychosis observed.  Patient does not meet criteria for psychiatric inpatient admission. Supportive therapy provided about ongoing stressors and encouraged the patient to follow up with this ACT team. Discussed crisis plan, support from social network, calling 911, coming to the Emergency Department or [facility] Urgent Care and calling the Suicide Hotline. Patient declined outpatient resources when offered by provider.  Patient requested transportation to [address] Patient was provided with  a taxi voucher to the stated address.   Layla Barter, NP 02/08/2021, 11:53 PM

## 2021-02-08 NOTE — ED Provider Notes (Signed)
[name] EMERGENCY DEPARTMENT Provider Note   CSN: [id number] Arrival date & time: 02/08/21  0409     History No chief complaint on file.   [name] is a 30 y.o. male.  Patient presents to the emergency department with a chief complaint of requesting a "chill pill."  He states that he is not right in the mind.  And then something to calm him down.  He denies SI or HI.  He has been seen recently in the emergency department and at behavioral health.  He denies drug or alcohol use.  Denies any recent illnesses.  The history is provided by the patient. No language interpreter was used.       Past Medical History:  Diagnosis Date  . Depression   . Schizophrenia [facility])     Patient Active Problem List   Diagnosis Date Noted  . 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 Schizophrenia, paranoid type (HCC) 10/24/2020  . Paranoid schizophrenia (HCC) 01/05/2020  . Other schizophrenia (HCC) 02/14/2018  . Cannabis use disorder, moderate, dependence (HCC) 05/10/2016    Past Surgical History:  Procedure Laterality Date  . BACK SURGERY         History reviewed. No pertinent family history.  Social History   Tobacco Use  . Smoking status: Current Some Day Smoker    Types: Cigarettes  . Smokeless tobacco: Never Used  Vaping Use  . Vaping Use: Never used  Substance Use Topics  . Alcohol use: No  . Drug use: Yes    Types: Marijuana    Comment: rare    Home Medications Prior to Admission medications   Medication Sig Start Date End Date Taking? Authorizing Provider  hydrOXYzine (ATARAX/VISTARIL) 25 MG tablet Take 1 tablet (25 mg total) by mouth every 6 (six) hours as needed for anxiety. Patient not taking: No sig reported 01/26/21   Mesner, Barbara Cower, MD  paliperidone (INVEGA SUSTENNA) 156 MG/ML SUSY injection Inject 156 mg into the muscle every 28 (twenty-eight) days. Patient's last dose was on 01/16/21. Patient not taking: Reported on 02/01/2021     [provider]  paliperidone (INVEGA) 6 MG 24 hr tablet Take 1 tablet (6 mg total) by mouth daily for 14 days. Patient not taking: No sig reported 01/02/21 01/16/21  Mare Ferrari, PA-C    Allergies    Shellfish allergy and Bee pollen  Review of Systems   Review of Systems  All other systems reviewed and are negative.   Physical Exam Updated Vital Signs BP 135/85 (BP Location: Right Arm)   Pulse (!) 107   Temp 98.3 F (36.8 C)   Resp 15   SpO2 96%   Physical Exam Vitals and nursing note reviewed.  Constitutional:      General: He is not in acute distress.    Appearance: He is well-developed. He is not ill-appearing.  HENT:     Head: Normocephalic and atraumatic.  Eyes:     Conjunctiva/sclera: Conjunctivae normal.  Cardiovascular:     Rate and Rhythm: Normal rate.  Pulmonary:     Effort: Pulmonary effort is normal. No respiratory distress.  Abdominal:     General: There is no distension.  Musculoskeletal:     Cervical back: Neck supple.     Comments: Moves all extremities  Skin:    General: Skin is warm and dry.  Neurological:     Mental Status: He is alert and oriented to person, place, and time.  Psychiatric:  Comments: Flat affect     ED Results / Procedures / Treatments   Labs (all labs ordered are listed, but only abnormal results are displayed) Labs Reviewed - No data to display  EKG None  Radiology No results found.  Procedures Procedures   Medications Ordered in ED Medications - No data to display  ED Course  I have reviewed the triage vital signs and the nursing notes.  Pertinent labs & imaging results that were available during my care of the patient were reviewed by me and considered in my medical decision making (see chart for details).    MDM Rules/Calculators/A&P                          Patient here stating that he needs something to help him chill out.  He requests a "chill pill."  Patient has been seen recently  for the same.  He does not appear to be in any acute distress.  He denies SI or HI.  I have offered him resources for outpatient follow-up. Final Clinical Impression(s) / ED Diagnoses Final diagnoses:  Anxiety    Rx / DC Orders ED Discharge Orders    None       Roxy Horseman, PA-C 02/08/21 0436    Palumbo, April, MD 02/08/21 [phone]

## 2021-02-08 NOTE — ED Triage Notes (Signed)
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 Denies SI/HI or hallucinations.

## 2021-02-08 NOTE — ED Notes (Addendum)
Pt seen walking out of triage. 

## 2021-02-09 ENCOUNTER — Other Ambulatory Visit: Payer: Self-pay

## 2021-02-09 ENCOUNTER — Emergency Department (HOSPITAL_COMMUNITY)
Admission: EM | Admit: 2021-02-09 | Discharge: 2021-02-10 | Disposition: A | Discharge status: Home / Self Care | Payer: Medicaid - Out of State | Attending: Emergency Medicine | Admitting: Emergency Medicine

## 2021-02-09 DIAGNOSIS — Z046 Encounter for general psychiatric examination, requested by authority: Secondary | ICD-10-CM | POA: Insufficient documentation

## 2021-02-09 DIAGNOSIS — F17210 Nicotine dependence, cigarettes, uncomplicated: Secondary | ICD-10-CM | POA: Insufficient documentation

## 2021-02-09 DIAGNOSIS — F99 Mental disorder, not otherwise specified: Secondary | ICD-10-CM

## 2021-02-09 MED ORDER — HYDROXYZINE HCL 25 MG PO TABS
25.0000 mg | ORAL_TABLET | Freq: Once | ORAL | Status: AC
  Administered 2021-02-09: 25 mg via ORAL
  Filled 2021-02-09: qty 1

## 2021-02-09 MED ORDER — LORAZEPAM 0.5 MG PO TABS
0.5000 mg | ORAL_TABLET | Freq: Once | ORAL | Status: AC
  Administered 2021-02-09: 0.5 mg via ORAL
  Filled 2021-02-09: qty 1

## 2021-02-09 NOTE — ED Provider Notes (Signed)
[facility]-EMERGENCY DEPT Provider Note   CSN: [id number] Arrival date & time: 02/09/21  2055     History Chief Complaint  Patient presents with  . Psychiatric Evaluation    [name] is a 30 y.o. male here with nonspecific complaints.  He states he came in to "get my head straight."  He says, "I just want my pill."  He denies seeing or visiting behavioral health yesterday (on 5/3), as is documented in the records.  He was seen initial in the ED initially on 5/3, then went to [facility] as a walk in.Marland Kitchen  He gets invega injections for schizophrenia.  He is currently homeless.  He denies SI.  He has an ACT team.  HPI     Past Medical History:  Diagnosis Date  . Depression   . Schizophrenia [facility])     Patient Active Problem List   Diagnosis Date Noted  . 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 Schizophrenia, paranoid type (HCC) 10/24/2020  . Paranoid schizophrenia (HCC) 01/05/2020  . Other schizophrenia (HCC) 02/14/2018  . Cannabis use disorder, moderate, dependence (HCC) 05/10/2016    Past Surgical History:  Procedure Laterality Date  . BACK SURGERY         No family history on file.  Social History   Tobacco Use  . Smoking status: Current Some Day Smoker    Types: Cigarettes  . Smokeless tobacco: Never Used  Vaping Use  . Vaping Use: Never used  Substance Use Topics  . Alcohol use: No  . Drug use: Yes    Types: Marijuana    Comment: rare    Home Medications Prior to Admission medications   Medication Sig Start Date End Date Taking? Authorizing Provider  hydrOXYzine (ATARAX/VISTARIL) 25 MG tablet Take 1 tablet (25 mg total) by mouth every 6 (six) hours as needed for anxiety. Patient not taking: No sig reported 01/26/21   Mesner, Barbara Cower, MD  paliperidone (INVEGA SUSTENNA) 156 MG/ML SUSY injection Inject 156 mg into the muscle every 28 (twenty-eight) days. Patient's last dose was on 01/16/21. Patient not taking: Reported on 02/01/2021     [provider]  paliperidone (INVEGA) 6 MG 24 hr tablet Take 1 tablet (6 mg total) by mouth daily for 14 days. Patient not taking: No sig reported 01/02/21 01/16/21  Mare Ferrari, PA-C    Allergies    Shellfish allergy and Bee pollen  Review of Systems   Review of Systems  Constitutional: Negative for chills and fever.  HENT: Negative for ear pain and sore throat.   Eyes: Negative for pain and visual disturbance.  Respiratory: Negative for cough and shortness of breath.   Cardiovascular: Negative for chest pain and palpitations.  Gastrointestinal: Negative for abdominal pain and vomiting.  Genitourinary: Negative for dysuria and hematuria.  Musculoskeletal: Negative for arthralgias and back pain.  Skin: Negative for color change and rash.  Neurological: Negative for seizures and syncope.  All other systems reviewed and are negative.   Physical Exam Updated Vital Signs BP 128/76   Pulse 75   Temp 98 F (36.7 C)   Resp 18   SpO2 98%   Physical Exam Constitutional:      General: He is not in acute distress. HENT:     Head: Normocephalic and atraumatic.  Eyes:     Conjunctiva/sclera: Conjunctivae normal.     Pupils: Pupils are equal, round, and reactive to light.  Cardiovascular:     Rate and Rhythm: Normal rate and regular  rhythm.  Pulmonary:     Effort: Pulmonary effort is normal. No respiratory distress.  Abdominal:     General: There is no distension.     Tenderness: There is no abdominal tenderness.  Skin:    General: Skin is warm and dry.  Neurological:     General: No focal deficit present.     Mental Status: He is alert and oriented to person, place, and time. Mental status is at baseline.     ED Results / Procedures / Treatments   Labs (all labs ordered are listed, but only abnormal results are displayed) Labs Reviewed - No data to display  EKG None  Radiology No results found.  Procedures Procedures   Medications Ordered in  ED Medications  hydrOXYzine (ATARAX/VISTARIL) tablet 25 mg (25 mg Oral Given 02/09/21 2300)  LORazepam (ATIVAN) tablet 0.5 mg (0.5 mg Oral Given 02/09/21 2300)    ED Course  I have reviewed the triage vital signs and the nursing notes.  Pertinent labs & imaging results that were available during my care of the patient were reviewed by me and considered in my medical decision making (see chart for details).  30 yo male here with nonspecific psychiatric complaints.  He has had multiple visits to the ED and [facility] this week with similar complaint.  He is homeless.  On my exam he is clear in conversation, is not responding to internal stimuli.  No reported SI/HI.  Vitals are normal - doubt acute infection, or significant medical disease.  We'll give visteril, ativan for anxiety, and I anticipate discharge.  Advised he can follow up at the behavioral health clinic again if he has concerns about mental health issues.    Final Clinical Impression(s) / ED Diagnoses Final diagnoses:  Psychiatric complaint    Rx / DC Orders ED Discharge Orders    None       Terald Sleeper, MD 02/10/21 1139

## 2021-02-09 NOTE — ED Triage Notes (Signed)
Pt found on side of road, [employer] were pt c/o feeling sick in the head admits to hearing voices but denies visual hallucinations also denies SI or HI

## 2021-02-09 NOTE — ED Triage Notes (Signed)
Pt presents via [facility], states he 'feels sick in the head'. Does reports auditory hallucinations, denies SI/HI

## 2021-02-09 NOTE — BH Assessment (Addendum)
Comprehensive Clinical Assessment (CCA) Note  02/09/2021 Talitha GivensMarlo Cummings [id number]   Disposition: Liborio NixonPatrice White, NP recommends pt does not meet inpatient treatment criteria, provider encouraged the pt to follow up with ACT Team.   The patient demonstrates the following risk factors for suicide: Chronic risk factors for suicide include: psychiatric disorder of Schizophrenia. Acute risk factors for suicide include: N/A. Protective factors for this patient include: Pt denies, SI, HI, AVH, self-injurious behaviors and access to weapons. Considering these factors, the overall suicide risk at this point appears to be low. Patient is appropriate for outpatient follow up.  Talitha GivensMarlo Buening is a 30 year old male who [facility] and [facility]. Per chart, [facility] and [facility]. [facility]. Pt was a poor historian during the assessment and responded to the majority of questions "I don't know." Clinician asked the pt, "what brought you to the hospital?" Pt reported, he's scared for his life. Pt reported, he doesn't know how long he's felt this way or why he feels this way. Pt reported, he does not feel someone is after him. Pt reported, it just happens sometimes. Pt denies, SI, HI, AVH, self-injurious behaviors and access to weapons.    Pt denies, substance use. No UDS was completed during pt's recent ED visits. Pt denies, being linked to OPT resources (medication management and/or counseling.) Pt reported, he does not want to take medications. Per chart, [facility]. [facility].   Pt presents quiet, awake with normal speech. Pt's moos was UTA. Pt's affect was flat. Pt's insight was poor. Pt's judgement was fair. Clinician observed pt telling NP he was tired and wanted to go to sleep while discussing his discharge plan. Pt reported, if discharged he can  contract for safety.   Diagnosis: Schizophrenia, paranoid, type (HCC).   *Pt declined for clinician or NP to contact his aunt/payee to obtain additional information.*   Chief Complaint:  Chief Complaint  Patient presents with  . Psych Evla   Visit Diagnosis:    CCA Screening, Triage and Referral (STR)  Patient Reported Information How did you hear about us? Self  Referral name: Talitha GivensMarlo Schirm, self.  Referral phone number: [phone] (N/A)   Whom do you see for routine medical problems? I don't have a doctor  Practice/Facility Name: No data recorded Practice/Facility Phone Number: No data recorded Name of Contact: No data recorded Contact Number: No data recorded Contact Fax Number: No data recorded Prescriber Name: No data recorded Prescriber Address (if known): No data recorded  What Is the Reason for Your Visit/Call Today? Pt reported, he's scared for is life but could not expound to why and how long he's been feeling this way.  How Long Has This Been Causing You Problems? <Week  What Do You Feel Would Help You the Most Today? Medication(s)   Have You Recently Been in Any Inpatient Treatment (Hospital/Detox/Crisis Center/28-Day Program)? No  Name/Location of Program/Hospital:[facility]  How Long Were You There? Per chart, "2 days."  When Were You Discharged? 02/24/2020 (Per chart.)   Have You [facility]ceived [facility] Before? Yes  Who [facility]? Pt [facility], pt has  previous vists to [facility] and [facility].   Have You Recently Had Any Thoughts About Hurting Yourself? No  Are  You Planning to Commit Suicide/Harm Yourself At This time? No   Have you Recently Had Thoughts About Hurting Someone [name]? No  Explanation: No data recorded  Have You Used Any Alcohol or Drugs in the Past 24 Hours? No (Pt denies.)  How Long Ago Did You Use Drugs or Alcohol? 0000 (Unclear)  What Did You Use and How Much?  UTA   Do You Currently Have a Therapist/Psychiatrist? No (Pt denies.)  Name of Therapist/Psychiatrist: Reportedly patient has ACTT services.   Have You Been Recently Discharged From Any Office Practice or Programs? No  Explanation of Discharge From Practice/Program: No data recorded    CCA Screening Triage Referral Assessment Type of Contact: Face-to-Face  Is this Initial or Reassessment? Initial Assessment  Date Telepsych consult ordered in [facility]:  01/24/2021  Time Telepsych consult ordered in [facility]:  0321   Patient Reported Information Reviewed? No  Patient Left Without Being Seen? No  Reason for Not Completing Assessment: No data recorded  Collateral Involvement: Pt declined for clinician or NP  to contact his aunt/payee to gather additional information.   Does Patient Have a Automotive engineer Guardian? No data recorded Name and Contact of Legal Guardian: [name], legal guardian.   If Minor and Not Living with Parent(s), Who has Custody? N/A  Is CPS involved or ever been involved? Never  Is APS involved or ever been involved? Never   Patient Determined To Be At Risk for Harm To Self or Others Based on Review of Patient Reported Information or Presenting Complaint? No  Method: No Plan  Availability of Means: No access or NA  Intent: Vague intent or NA  Notification Required: No need or identified person  Additional Information for Danger to Others Potential: Active psychosis  Additional Comments for Danger to Others Potential: History of children playing outside triggering patient.  Are There Guns or Other Weapons in Your Home? No  Types of Guns/Weapons: No data recorded Are These Weapons Safely Secured?                            No data recorded Who Could Verify You Are Able To Have These Secured: No data recorded Do You Have any Outstanding Charges, Pending Court Dates, Parole/Probation? None noted  Contacted To Inform of Risk of Harm To Self or  Others: Unable to Contact: (NA)   Location of Assessment: [facility]   Does Patient Present under Involuntary Commitment? No  IVC Papers Initial File Date: 10/22/2020   [place] of Residence: [place]   Patient Currently Receiving the Following Services: Not Receiving Services   Determination of Need: Routine (7 days)   Options For Referral: Medication Management; Outpatient Therapy     CCA Biopsychosocial Intake/Chief Complaint:  Pt reports, he's scared for his life.  Current Symptoms/Problems: Pt is scared for his life.   Patient Reported Schizophrenia/Schizoaffective Diagnosis in Past: Yes   Strengths: Not assessed.  Preferences: Not assessed.  Abilities: Not assessed.   Type of Services Patient Feels are Needed: Pt reports he can contract for safety if discharged.   Initial Clinical Notes/Concerns: Pt very guarded, denied SI, HI, AVH, self-injurious behavior, and substance use concerns   Mental Health Symptoms Depression:  None (Pt denies.)   Duration of Depressive symptoms: Greater than two weeks   Mania:  Change in energy/activity   Anxiety:   None (Pt denies.)   Psychosis:  None (Pt denies.)   Duration of Psychotic  symptoms: Greater than six months   Trauma:  None (Pt denies.)   Obsessions:  -- (UTA)   Compulsions:  -- (UTA)   Inattention:  None (Pt denies.)   Hyperactivity/Impulsivity:  -- (UTA)   Oppositional/Defiant Behaviors:  -- (UTA)   Emotional Irregularity:  -- (UTA)   Other Mood/Personality Symptoms:  paranoia (per earlier report)    Mental Status Exam Appearance and self-care  Stature:  Average   Weight:  Average weight   Clothing:  Disheveled   Grooming:  Neglected   Cosmetic use:  None   Posture/gait:  Slumped   Motor activity:  Not Remarkable   Sensorium  Attention:  Normal   Concentration:  Normal   Orientation:  Person; Time   Recall/memory:  Defective in Immediate; Defective in  Short-term   Affect and Mood  Affect:  Flat   Mood:  Other (Comment) (UTA)   Relating  Eye contact:  -- (Poor.)   Facial expression:  Constricted   Attitude toward examiner:  Guarded   Thought and Language  Speech flow: Normal   Thought content:  Suspicious   Preoccupation:  None   Hallucinations:  None   Organization:  No data recorded  [employer] of Knowledge:  Poor   Intelligence:  Needs investigation   Abstraction:  -- (UTA)   Judgement:  Fair   Reality Testing:  Adequate   Insight:  Poor   Decision Making:  Only simple   Social Functioning  Social Maturity:  -- Industrial/product designer)   Social Judgement:  -- (UTA)   Stress  Stressors:  Other (Comment) (UTA)   Coping Ability:  Overwhelmed   Skill Deficits:  Decision making; Communication   Supports:  Support needed     Religion: Religion/Spirituality Are You A Religious Person?:  Industrial/product designer)  Leisure/Recreation: Leisure / Recreation Do You Have Hobbies?:  (UTA)  Exercise/Diet: Exercise/Diet Do You Exercise?:  (UTA) Do You Follow a Special Diet?:  (UTA) Do You Have Any Trouble Sleeping?:  (UTA)   CCA Employment/Education Employment/Work Situation: Employment / Work Situation Employment situation: On disability (Per chart.) Why is patient on disability: UTA How long has patient been on disability: UTA What is the longest time patient has a held a job?: UTA 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 Has patient ever been in the [employer]?:  Industrial/product designer)  Education: Education Name of [employer] School: UTA Did Garment/textile technologist From [employer]?:  (UTA) Did You Attend College?:  (UTA) Did You Attend Graduate School?:  (UTA)   CCA Family/Childhood History Family and Relationship History: Family history Marital status: Single What is your sexual orientation?: Not assessed. Has your sexual activity been affected by drugs, alcohol, medication, or emotional stress?: Not assessed. Does patient have  children?: No  Childhood History:  Childhood History Additional childhood history information: UTA Description of patient's relationship with caregiver when they were a child: UTA Patient's description of current relationship with people who raised him/her: UTA How were you disciplined when you got in trouble as a child/adolescent?: UTA Does patient have siblings?: No Did patient suffer any verbal/emotional/physical/sexual abuse as a child?: No (Pt denies.) Has patient ever been sexually abused/assaulted/raped as an adolescent or adult?: No (Pt denies.) Witnessed domestic violence?: No (Pt denies.)  Child/Adolescent Assessment:     CCA Substance Use Alcohol/Drug Use: Alcohol / Drug Use Pain Medications: See MAR Prescriptions: See MAR Over the Counter: See MAR History of alcohol / drug use?: No history of alcohol / drug  abuse (Pt denies.)    ASAM's:  Six Dimensions of Multidimensional Assessment  Dimension 1:  Acute Intoxication and/or Withdrawal Potential:      Dimension 2:  Biomedical Conditions and Complications:      Dimension 3:  Emotional, Behavioral, or Cognitive Conditions and Complications:     Dimension 4:  Readiness to Change:     Dimension 5:  Relapse, Continued use, or Continued Problem Potential:     Dimension 6:  Recovery/Living Environment:     ASAM Severity Score:    ASAM Recommended Level of Treatment:     Substance use Disorder (SUD)    Recommendations for Services/Supports/Treatments: Recommendations for Services/Supports/Treatments Recommendations For Services/Supports/Treatments: Medication Management,Individual Therapy  DSM5 Diagnoses: Patient Active Problem List   Diagnosis Date Noted  . 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 Schizophrenia, paranoid type (HCC) 10/24/2020  . Paranoid schizophrenia (HCC) 01/05/2020  . Other schizophrenia (HCC) 02/14/2018  . Cannabis use disorder, moderate, dependence (HCC) 05/10/2016    Referrals to  Alternative Service(s): Referred to Alternative Service(s):   Place:   Date:   Time:    Referred to Alternative Service(s):   Place:   Date:   Time:    Referred to Alternative Service(s):   Place:   Date:   Time:    Referred to Alternative Service(s):   Place:   Date:   Time:     Redmond Pulling, [facility]  Comprehensive Clinical Assessment (CCA) Screening, Triage and Referral Note  02/09/2021 Kentrell Hallahan [id number]  Chief Complaint:  Chief Complaint  Patient presents with  . Psych Evla   Visit Diagnosis:   Patient Reported Information How did you hear about [place]? Self   Referral name: Deven Furia, self.   Referral phone number: 0 (N/A)  Whom do you see for routine medical problems? I don't have a doctor   Practice/Facility Name: No data recorded  Practice/Facility Phone Number: No data recorded  Name of Contact: No data recorded  Contact Number: No data recorded  Contact Fax Number: No data recorded  Prescriber Name: No data recorded  Prescriber Address (if known): No data recorded What Is the Reason for Your Visit/Call Today? Pt reported, he's scared for is life but could not expound to why and how long he's been feeling this way.  How Long Has This Been Causing You Problems? <Week  Have You Recently Been in Any Inpatient Treatment (Hospital/Detox/Crisis Center/28-Day Program)? No   Name/Location of Program/Hospital:[facility]   How Long Were You There? Per chart, "2 days."   When Were You Discharged? 02/24/2020 (Per chart.)  Have You Ever Received Services From [facility] Before? Yes   Who Do You See at [facility]? Pt has previous inpatient admissions to [facility], pt has  previous vists to [facility] and [facility].  Have You Recently Had Any Thoughts About Hurting Yourself? No   Are You Planning to Commit Suicide/Harm Yourself At This time?  No  Have you Recently Had Thoughts About Hurting Someone [name]? No   Explanation: No data recorded Have You Used Any Alcohol  or Drugs in the Past 24 Hours? No (Pt denies.)   How Long Ago Did You Use Drugs or Alcohol?  0000 (Unclear)   What Did You Use and How Much? UTA  What Do You Feel Would Help You the Most Today? Medication(s)  Do You Currently Have a Therapist/Psychiatrist? No (Pt denies.)   Name of Therapist/Psychiatrist: Reportedly patient has ACTT services.   Have You Been Recently Discharged  From Any Public relations account executive or Programs? No   Explanation of Discharge From Practice/Program:  No data recorded    CCA Screening Triage Referral Assessment Type of Contact: Face-to-Face   Is this Initial or Reassessment? Initial Assessment   Date Telepsych consult ordered in [facility]:  01/24/2021   Time Telepsych consult ordered in [facility]:  0321  Patient Reported Information Reviewed? No   Patient Left Without Being Seen? No   Reason for Not Completing Assessment: No data recorded Collateral Involvement: Pt declined for clinician or NP  to contact his aunt/payee to gather additional information.  Does Patient Have a Automotive engineer Guardian? No data recorded  Name and Contact of Legal Guardian:  [name], legal guardian.   If Minor and Not Living with Parent(s), Who has Custody? N/A  Is CPS involved or ever been involved? Never  Is APS involved or ever been involved? Never  Patient Determined To Be At Risk for Harm To Self or Others Based on Review of Patient Reported Information or Presenting Complaint? No   Method: No Plan   Availability of Means: No access or NA   Intent: Vague intent or NA   Notification Required: No need or identified person   Additional Information for Danger to Others Potential:  Active psychosis   Additional Comments for Danger to Others Potential:  History of children playing outside triggering patient.   Are There Guns or Other Weapons in Your Home?  No    Types of Guns/Weapons: No data recorded   Are These Weapons Safely Secured?                               No data recorded   Who Could Verify You Are Able To Have These Secured:    No data recorded Do You Have any Outstanding Charges, Pending Court Dates, Parole/Probation? None noted  Contacted To Inform of Risk of Harm To Self or Others: Unable to Contact: (NA)  Location of Assessment: [facility]  Does Patient Present under Involuntary Commitment? No   IVC Papers Initial File Date: 10/22/2020   [place] of Residence: [place]  Patient Currently Receiving the Following Services: Not Receiving Services   Determination of Need: Routine (7 days)   Options For Referral: Medication Management; Outpatient Therapy   Redmond Pulling, [facility]       Redmond Pulling, MS, [facility], [facility] Triage Specialist [phone]

## 2021-02-10 ENCOUNTER — Inpatient Hospital Stay (HOSPITAL_COMMUNITY): Admission: RE | Admit: 2021-02-10 | Payer: Medicaid Other | Admitting: Psychiatry

## 2021-02-10 NOTE — BH Assessment (Addendum)
Comprehensive Clinical Assessment (CCA) Screening, Triage and Referral Note  02/10/2021 [name] [id number] Disposition: Patient was seen by Adrian Nixon, NP for his MSE.  He does not meet inpatient care criteria.  Patient was at [facility] and discharged and walked down to [facility]  Patient says he does not want to kill himself or harm others.  He admits to hearing voices but he cannot tell what they are saying "I don't know."  Patient says he used to have ACTT services from [facility] but "I don't deal with them anymore."    Patient has a flat affect and is sleepy.  He does not appear to be responding to internal stimuli.  He does not show andy delusional behavior tonight.  Pt sleep and appetite are WNL.    Chief Complaint: No chief complaint on file.  Visit Diagnosis: Schizophrenia  Patient Reported Information How did you hear about [place]? Self   Referral name: [name]   Referral phone number: 0 (N/A)  Whom do you see for routine medical problems? I don't have a doctor   Practice/Facility Name: No data recorded  Practice/Facility Phone Number: No data recorded  Name of Contact: No data recorded  Contact Number: No data recorded  Contact Fax Number: No data recorded  Prescriber Name: No data recorded  Prescriber Address (if known): No data recorded What Is the Reason for Your Visit/Call Today? Pt reports "I am not in the right state of mind."  Pt says he has no SI, HI. Is hearing voices but when asked what they say he says "I don't know."  How Long Has This Been Causing You Problems? 1-6 months  Have You Recently Been in Any Inpatient Treatment (Hospital/Detox/Crisis Center/28-Day Program)? No   Name/Location of Program/Hospital:[facility]   How Long Were You There? Per chart, "2 days."   When Were You Discharged? 02/24/2020 (Per chart.)  Have You Ever Received Services From [facility] Before? Yes   Who Do You See at [facility]? Pt has previous inpatient admissions to [facility],  pt has  previous vists to [facility] and [facility].  Have You Recently Had Any Thoughts About Hurting Yourself? No   Are You Planning to Commit Suicide/Harm Yourself At This time?  No  Have you Recently Had Thoughts About Hurting Someone [name]? No   Explanation: No data recorded Have You Used Any Alcohol or Drugs in the Past 24 Hours? No   How Long Ago Did You Use Drugs or Alcohol?  0000 (Unclear)   What Did You Use and How Much? Pt says he did not use anyting recently.  What Do You Feel Would Help You the Most Today? Medication(s)  Do You Currently Have a Therapist/Psychiatrist? No (Pt reportedly has ACTT services from [place] but he says he does not deal with them anymore.)   Name of Therapist/Psychiatrist: Pt says he does not deal with [name] anymore.   Have You Been Recently Discharged From Any Office Practice or Programs? No   Explanation of Discharge From Practice/Program:  No data recorded    CCA Screening Triage Referral Assessment Type of Contact: Face-to-Face   Is this Initial or Reassessment? Initial Assessment   Date Telepsych consult ordered in [facility]:  01/24/2021   Time Telepsych consult ordered in [facility]:  0321  Patient Reported Information Reviewed? Yes   Patient Left Without Being Seen? No   Reason for Not Completing Assessment: No data recorded Collateral Involvement: Pt was fine with his aunt being called.  Does  Patient Have a Automotive engineer Guardian? No data recorded  Name and Contact of Legal Guardian:  [name], legal guardian.   If Minor and Not Living with Parent(s), Who has Custody? N/A  Is CPS involved or ever been involved? Never  Is APS involved or ever been involved? Never  Patient Determined To Be At Risk for Harm To Self or Others Based on Review of Patient Reported Information or Presenting Complaint? No   Method: No Plan   Availability of Means: No access or NA   Intent: Vague intent or NA   Notification Required: No  need or identified person   Additional Information for Danger to Others Potential:  Active psychosis   Additional Comments for Danger to Others Potential:  History of children playing outside triggering patient.   Are There Guns or Other Weapons in Your Home?  No    Types of Guns/Weapons: No data recorded   Are These Weapons Safely Secured?                              No data recorded   Who Could Verify You Are Able To Have These Secured:    No data recorded Do You Have any Outstanding Charges, Pending Court Dates, Parole/Probation? None noted  Contacted To Inform of Risk of Harm To Self or Others: Family/Significant Other:  Location of Assessment: [place]  Does Patient Present under Involuntary Commitment? No   IVC Papers Initial File Date: 10/22/2020   [place] of Residence: [place]  Patient Currently Receiving the Following Services: Not Receiving Services   Determination of Need: Routine (7 days)   Options For Referral: Other: Comment (Pt to be discharged to return to aunt's house.)   [name], LCAS

## 2021-02-10 NOTE — H&P (Addendum)
Behavioral Health Medical Screening Exam  [name] is a 30 y.o. male who presents to the [facility] voluntarily as a walk-in with a chief complaint of "not in right state of mind. I need medicine to help me focus on life."  The patient has a significant past psychiatric history of paranoid schizophrenia and several visits to the [facility] EDs and behavioral health services in the past six months with similar presentation. Prior to arrival at [facility] this morning, the patient was evaluated last night on 02/09/21 at [facility] at 21:14.  On approach, the patient is alert and oriented x 3. His speech is coherent, tone is at a normal rate and his thought process is somewhat logical when he does answer questions. He mostly answers questions by saying "I don't know. He is calm and cooperative during the assessment. He is dressed casually in jeans and a t-shirt. He denies suicidal and homicidal ideations. He endorses auditory hallucinations, but is unable to elaborate and states "I don't know."  He denies visual hallucinations. He does not appear to be responding to internal, or external stimuli. He denies feeling paranoid. He denies using illicit drugs, including THC, heroin, or cocaine. He denies alcohol use. He denies having an ACT team and stated that he does not stay in contact with them because "they are no good."  He is requesting medicine to help him focus on life and states that he wants "Xanax" to help his mind. This provider explained that we do not prescribed controlled medications during a walk-in evaluation. I discussed with the patient the option to go over to the Behavioral Health Urgent Care to get his monthly dose of Invega IM that is due in four days. He stated that he doesn't' want the Invega injection and requested to have "Ativan." He then stated that he was ready to go home and asked if I could provide him with transportation. I asked if this provider could contact his aunt [name] to see if she would be able to pick him up from the [facility] and he stated "yes." This writer contacted Ms. [name] at [phone]. Ms. [name] states that the patient has his own apartment in [place] [facility] [place] [facility], but he doesn't't stay there unless she's there's with him. She states that [facility] stays with her and she provides for him. She states that he receives money and doesn't want for clothes, shoes, or food. She states that he is followed by [name] at [place] 3 days per week. She states that [name] last saw the patient this past Tuesday. She states that [facility] doesn't comply with treatment and refuses his Invega injections. She voices no concerns with [name] returning home tonight. She states that she is unable to provide transportation because she does not have a car, but if we can provide transportation then we can send him to her house at [address] ([address]. I discussed this plan with Finnean who agrees to return back to his aunt house.   Per chart review: Patient received Invega Sustenna 156 mg IM (every 28 days) on 01/16/21 at [facility].        Total Time spent with patient: 15 minutes  Psychiatric Specialty Exam:  Presentation  General Appearance: Appropriate for Environment  Eye Contact:Fair  Speech:Clear and Coherent  Speech Volume:Normal  Handedness:Right   Mood and Affect  Mood:Euthymic  Affect:Congruent   Thought Process  Thought Processes:Coherent  Descriptions of Associations:Intact  Orientation:Full (Time, Place and  Person)  Thought Content:WDL  History of Schizophrenia/Schizoaffective disorder:Yes  Duration of Psychotic Symptoms:Greater than six months  Hallucinations:Hallucinations: Auditory Description of Auditory Hallucinations: Patient endorses auditory hallucination but is unable to elaborate. He states "I don't know"  Ideas of Reference:None  Suicidal Thoughts:Suicidal Thoughts: No  Homicidal Thoughts:Homicidal  Thoughts: No   Sensorium  Memory:Immediate Fair; Recent Fair; Remote Poor  Judgment:Intact  Insight:Present   Executive Functions  Concentration:Fair  Attention Span:Fair  Recall:Poor  Fund of Knowledge:Fair  Language:Fair   Psychomotor Activity  Psychomotor Activity:Psychomotor Activity: Restlessness   Assets  Assets:Social Support; Housing; Health and safety inspector; Leisure Time; Physical Health   Sleep  Sleep:Sleep: Fair    Physical Exam: Physical Exam Constitutional:      Appearance: Normal appearance.  HENT:     Head: Normocephalic.     Nose: Nose normal.  Cardiovascular:     Rate and Rhythm: Normal rate.     Pulses: Normal pulses.  Pulmonary:     Effort: Pulmonary effort is normal.     Breath sounds: Normal breath sounds.  Musculoskeletal:        General: Normal range of motion.     Cervical back: Normal range of motion.  Neurological:     General: No focal deficit present.     Mental Status: He is alert and oriented to person, place, and time.  Psychiatric:        Behavior: Behavior normal.    Review of Systems  Constitutional: Negative.   HENT: Negative.   Eyes: Negative.   Respiratory: Negative.   Cardiovascular: Negative.   Gastrointestinal: Negative.   Genitourinary: Negative.   Musculoskeletal: Negative.   Skin: Negative.   Neurological: Negative.   Endo/Heme/Allergies: Negative.   Psychiatric/Behavioral: Positive for hallucinations. Negative for depression and suicidal ideas. The patient does not have insomnia.    Blood pressure 100/83, pulse 88, temperature 98.5 F (36.9 C), temperature source Oral, SpO2 99 %. There is no height or weight on file to calculate BMI.  Musculoskeletal: Strength & Muscle Tone: within normal limits Gait & Station: normal Patient leans: N/A   Recommendations:  Based on my evaluation the patient does not appear to have an emergency medical condition.   Disposition:  No evidence of  imminent risk to self or others at present. Patient does not appear to be actively responding to internal or external stimuli.   Patient does not meet criteria for psychiatric inpatient admission. Patient declined to go to the [facility] for medication management.   Supportive therapy provided about ongoing stressors and the importance of medication compliance. Patient encouraged to continue following up with [facility] ACT team.   Discussed crisis plan, support from social network, calling 911, coming to the Emergency Department or [facility] Urgent Care and calling the Suicide Hotline  Patient was provided with a bus pass for transportation home.    Ryian Lynde L, NP 02/10/2021, 2:03 AM

## 2021-02-13 ENCOUNTER — Emergency Department (HOSPITAL_COMMUNITY)
Admission: EM | Admit: 2021-02-13 | Discharge: 2021-02-13 | Disposition: A | Discharge status: Home / Self Care | Payer: Medicaid - Out of State | Attending: Emergency Medicine | Admitting: Emergency Medicine

## 2021-02-13 ENCOUNTER — Ambulatory Visit (HOSPITAL_COMMUNITY)
Admission: EM | Admit: 2021-02-13 | Discharge: 2021-02-13 | Disposition: A | Discharge status: Other Acute Inpatient Hospital | Payer: Medicaid Other | Attending: Psychiatry | Admitting: Psychiatry

## 2021-02-13 ENCOUNTER — Other Ambulatory Visit: Payer: Self-pay

## 2021-02-13 DIAGNOSIS — R0602 Shortness of breath: Secondary | ICD-10-CM | POA: Diagnosis present

## 2021-02-13 DIAGNOSIS — R0609 Other forms of dyspnea: Secondary | ICD-10-CM

## 2021-02-13 DIAGNOSIS — R062 Wheezing: Secondary | ICD-10-CM | POA: Insufficient documentation

## 2021-02-13 DIAGNOSIS — F17210 Nicotine dependence, cigarettes, uncomplicated: Secondary | ICD-10-CM | POA: Diagnosis not present

## 2021-02-13 DIAGNOSIS — R0600 Dyspnea, unspecified: Secondary | ICD-10-CM

## 2021-02-13 MED ORDER — ALBUTEROL SULFATE HFA 108 (90 BASE) MCG/ACT IN AERS
2.0000 | INHALATION_SPRAY | RESPIRATORY_TRACT | Status: DC | PRN
  Administered 2021-02-13: 2 via RESPIRATORY_TRACT
  Filled 2021-02-13: qty 6.7

## 2021-02-13 MED ORDER — ALBUTEROL SULFATE (2.5 MG/3ML) 0.083% IN NEBU
2.5000 mg | INHALATION_SOLUTION | Freq: Once | RESPIRATORY_TRACT | Status: DC
  Filled 2021-02-13: qty 3

## 2021-02-13 NOTE — ED Notes (Signed)
Patient in triage with Gerrianne Scale, NP.  Per NP, patient having respiratory distress.  Requesting nebulizer and stated patient would need to go to ED.  EMS called by this nurse.  Marchelle Folks, RN and [place], RN [facility] getting nebulizer supplies.

## 2021-02-13 NOTE — BH Assessment (Signed)
INITIAL SCREENING  Pt is a 30 year old single male who presents unaccompanied to [facility]. He appears to be making a wheezing noise and says he is having difficulty breathing. TTS immediately notified Cecilio Asper, NP and nursing staff. Pt says he came in due to breathing difficulty and hearing voices.  Determination of need: Emergent.   Pamalee Leyden, [facility], [facility] Triage Specialist [phone]

## 2021-02-13 NOTE — ED Notes (Signed)
Reported history of cocaine abuse. Patient denies using today.

## 2021-02-13 NOTE — ED Provider Notes (Signed)
[name] EMERGENCY DEPARTMENT Provider Note   CSN: [id number] Arrival date & time: 02/13/21  0259     History Chief Complaint  Patient presents with  . Shortness of Breath    [name] is a 30 y.o. male.  Patient was transferred from [facility] secondary to reported respiratory distress.  Patient was reported to go there secondary to shortness of breath.  He was apparently wheezing and I was told that he had oxygen saturations of 87%.  That started on oxygen and given an inhaler.  Nursing note states that prior to transfer over here the patient was not wheezing and was not complain of shortness of breath and did not remember exactly why he went to the urgent care.  No psychiatric complaints at this time.  Patient states he feels fine and is just sleepy.   Shortness of Breath      Past Medical History:  Diagnosis Date  . Depression   . Schizophrenia [facility])     Patient Active Problem List   Diagnosis Date Noted  . 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 Schizophrenia, paranoid type (HCC) 10/24/2020  . Paranoid schizophrenia (HCC) 01/05/2020  . Other schizophrenia (HCC) 02/14/2018  . Cannabis use disorder, moderate, dependence (HCC) 05/10/2016    Past Surgical History:  Procedure Laterality Date  . BACK SURGERY         No family history on file.  Social History   Tobacco Use  . Smoking status: Current Some Day Smoker    Types: Cigarettes  . Smokeless tobacco: Never Used  Vaping Use  . Vaping Use: Never used  Substance Use Topics  . Alcohol use: No  . Drug use: Yes    Types: Marijuana    Comment: rare    Home Medications Prior to Admission medications   Medication Sig Start Date End Date Taking? Authorizing Provider  hydrOXYzine (ATARAX/VISTARIL) 25 MG tablet Take 1 tablet (25 mg total) by mouth every 6 (six) hours as needed for anxiety. Patient not taking: No sig reported 01/26/21   Malaky Tetrault, Barbara Cower, MD  paliperidone (INVEGA  SUSTENNA) 156 MG/ML SUSY injection Inject 156 mg into the muscle every 28 (twenty-eight) days. Patient's last dose was on 01/16/21. Patient not taking: Reported on 02/01/2021    [provider]  paliperidone (INVEGA) 6 MG 24 hr tablet Take 1 tablet (6 mg total) by mouth daily for 14 days. Patient not taking: No sig reported 01/02/21 01/16/21  Mare Ferrari, PA-C    Allergies    Shellfish allergy and Bee pollen  Review of Systems   Review of Systems  Respiratory: Positive for shortness of breath.   All other systems reviewed and are negative.   Physical Exam Updated Vital Signs BP (!) 117/98   Pulse 66   Temp 98.1 F (36.7 C) (Oral)   Resp 16   Ht 5\' 5"  (1.651 m)   Wt 74.8 kg   SpO2 95%   BMI 27.46 kg/m   Physical Exam Vitals and nursing note reviewed.  Constitutional:      Appearance: He is well-developed.  HENT:     Head: Normocephalic and atraumatic.  Cardiovascular:     Rate and Rhythm: Normal rate.  Pulmonary:     Effort: Pulmonary effort is normal. No respiratory distress.  Abdominal:     General: There is no distension.  Musculoskeletal:        General: Normal range of motion.     Cervical back: Normal range of  motion.  Skin:    General: Skin is warm and dry.  Neurological:     General: No focal deficit present.     Mental Status: He is alert.     ED Results / Procedures / Treatments   Labs (all labs ordered are listed, but only abnormal results are displayed) Labs Reviewed - No data to display  EKG None  Radiology No results found.  Procedures Procedures   Medications Ordered in ED Medications - No data to display  ED Course  I have reviewed the triage vital signs and the nursing notes.  Pertinent labs & imaging results that were available during my care of the patient were reviewed by me and considered in my medical decision making (see chart for details).    MDM Rules/Calculators/A&P                          No respiratory  distress.  No hypoxia his oxygen is 100% on room air here he is breathing 15 times a minute.  Resting comfortably.  She was allowed to rest in the ER for few hours no recurrent hypoxia, tachypnea, respiratory distress or respiratory complaints even without treatment.  Stable for discharge.   Final Clinical Impression(s) / ED Diagnoses Final diagnoses:  SOB (shortness of breath)    Rx / DC Orders ED Discharge Orders    None       Emmajo Bennette, Barbara Cower, MD 02/13/21 2305

## 2021-02-13 NOTE — ED Notes (Signed)
Report called to [facility] ED charge RN. 

## 2021-02-13 NOTE — ED Triage Notes (Signed)
Patient arrives to UC every night, audible wheezing, chest tightness. Patient unaware of how to use rescue inhalers. Psych Hx.  5mg  albuterol given en route.  87% on RA at [facility].  3L put patient at 97%.

## 2021-02-13 NOTE — ED Notes (Signed)
EMS arrived to transport pt to [facility]. Pt is alert and responds "I don't know" to most questions. Pt no longer has audible wheezing. 3LPM O2 via nasal cannula in place. Pt is ambulatory. Denied SI/HI/AVH. Safety maintained.

## 2021-02-13 NOTE — ED Provider Notes (Addendum)
Behavioral Health Urgent Care Medical Screening Exam  Patient Name: [name] MRN: [id number] Date of Evaluation: 02/13/21 Chief Complaint:   Diagnosis:  Final diagnoses:  None    History of Present illness: [name] is a 30 y.o. male. Patient presented voluntarily to [facility] with complaint of "I can't breathe." patient was assessed in the lobby and assisted to triage area. Patient is noted to be wheezing, complaining of short of breath, chest tight/discomfort.  Vital sign: temp 97.5 temporal, BP 157/75, o2 sat 88% RA, RR 26 HR 109.  Patient was administered 2 puffs of albuterol inhaler and placed on 3L of o2 via nasal cannula with improvement in lung sounds and vital signs. EMS notifiy to transport patient to [facility].    Post treatment vital signs: temp 97.5, BP 133/94, RR 20, o2 sat 97% on 3L [place], HR 88   Patient is alert and oriented reports that he was walking in the area and became short of breath; he is unsure if he has a hx of asthma. He is a poor historian and response "I don't know" to most assessment question. He denies smoking and/or exposure to cigaratte, marijuana, or any respiratory irritant. He was noted to have nasal drainage but denies cold or flu symptoms.    He denies psychiatric concerns (he denied SI, HI, paranoia, AVH, delusion), dizziness, headache, chest pain, respiratory distress,or GI/GU symptoms.     Psychiatric Specialty Exam  Presentation  General Appearance:Appropriate for Environment  Eye Contact:Good  Speech:Clear and Coherent  Speech Volume:Decreased  Handedness:Right   Mood and Affect  Mood:Anxious  Affect:Congruent   Thought Process  Thought Processes:Coherent  Descriptions of Associations:Intact  Orientation:Full (Time, Place and Person)  Thought Content:WDL  Diagnosis of Schizophrenia or Schizoaffective disorder in past: Yes  Duration of Psychotic Symptoms: Greater than six months  Hallucinations:None Patient endorses  auditory hallucination but is unable to elaborate. He states "I don't know"  Ideas of Reference:None  Suicidal Thoughts:No  Homicidal Thoughts:No   Sensorium  Memory:Immediate Fair; Recent Fair; Remote Poor  Judgment:Intact  Insight:Present   Executive Functions  Concentration:Fair  Attention Span:Fair  Recall:Poor  Fund of Knowledge:Fair  Language:Fair   Psychomotor Activity  Psychomotor Activity:Restlessness   Assets  Assets:Social Support; Housing; Health and safety inspector; Leisure Time; Physical Health   Sleep  Sleep:Fair  Number of hours: 2   No data recorded  Physical Exam: Physical Exam Constitutional:      Appearance: He is not toxic-appearing.  HENT:     Nose: Rhinorrhea present.  Pulmonary:     Effort: Respiratory distress present.     Breath sounds: Wheezing present.  Neurological:     Mental Status: He is alert and oriented to person, place, and time.    ROS Blood pressure (!) 133/94, pulse 81, temperature (!) 97.5 F (36.4 C), temperature source Tympanic, resp. rate 20, SpO2 97 %. There is no height or weight on file to calculate BMI.  Musculoskeletal: Strength & Muscle Tone: within normal limits Gait & Station: normal Patient leans: Right   [facility] MSE Discharge Disposition for Follow up and Recommendations: Based on my evaluation the patient appears to have an emergency medical condition for which I recommend the patient be transferred to the emergency department for further evaluation.   Patient transferred to [facility], verbal report given to Dr. Erin Hearing.    Maricela Bo, NP 02/13/2021, 2:26 AM

## 2021-02-14 ENCOUNTER — Emergency Department (HOSPITAL_COMMUNITY)
Admission: EM | Admit: 2021-02-14 | Discharge: 2021-02-14 | Disposition: A | Discharge status: Home / Self Care | Payer: Medicaid - Out of State | Attending: Emergency Medicine | Admitting: Emergency Medicine

## 2021-02-14 ENCOUNTER — Encounter (HOSPITAL_COMMUNITY): Payer: Self-pay

## 2021-02-14 ENCOUNTER — Other Ambulatory Visit: Payer: Self-pay

## 2021-02-14 DIAGNOSIS — F209 Schizophrenia, unspecified: Secondary | ICD-10-CM | POA: Insufficient documentation

## 2021-02-14 DIAGNOSIS — F17210 Nicotine dependence, cigarettes, uncomplicated: Secondary | ICD-10-CM | POA: Diagnosis not present

## 2021-02-14 DIAGNOSIS — F419 Anxiety disorder, unspecified: Secondary | ICD-10-CM | POA: Diagnosis present

## 2021-02-14 DIAGNOSIS — R062 Wheezing: Secondary | ICD-10-CM | POA: Insufficient documentation

## 2021-02-14 MED ORDER — HYDROXYZINE HCL 25 MG PO TABS
25.0000 mg | ORAL_TABLET | Freq: Once | ORAL | Status: AC
  Administered 2021-02-14: 25 mg via ORAL
  Filled 2021-02-14: qty 1

## 2021-02-14 NOTE — ED Triage Notes (Signed)
Pt requesting chill pill to get his mind right. Denies si/hi.

## 2021-02-14 NOTE — ED Provider Notes (Signed)
[facility]-EMERGENCY DEPT Provider Note   CSN: [id number] Arrival date & time: 02/14/21  0416     History Chief Complaint  Patient presents with  . Anxiety    [name] is a 30 y.o. male.  HPI Patient states he needs a pill to get himself back together.  History of schizophrenia and depression.  Recently seen for same.  Has had 28 visits in the last 6 months including yesterday.  Patient is homeless.  Does not know what medicines he is normally given when he feels bad.  Appears to be on Invega susta.  Denies drug use.    Past Medical History:  Diagnosis Date  . Depression   . Schizophrenia [facility])     Patient Active Problem List   Diagnosis Date Noted  . 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 Schizophrenia, paranoid type (HCC) 10/24/2020  . Paranoid schizophrenia (HCC) 01/05/2020  . Other schizophrenia (HCC) 02/14/2018  . Cannabis use disorder, moderate, dependence (HCC) 05/10/2016    Past Surgical History:  Procedure Laterality Date  . BACK SURGERY         No family history on file.  Social History   Tobacco Use  . Smoking status: Current Some Day Smoker    Types: Cigarettes  . Smokeless tobacco: Never Used  Vaping Use  . Vaping Use: Never used  Substance Use Topics  . Alcohol use: No  . Drug use: Yes    Types: Marijuana    Comment: rare    Home Medications Prior to Admission medications   Medication Sig Start Date End Date Taking? Authorizing Provider  hydrOXYzine (ATARAX/VISTARIL) 25 MG tablet Take 1 tablet (25 mg total) by mouth every 6 (six) hours as needed for anxiety. Patient not taking: No sig reported 01/26/21   Mesner, Barbara Cower, MD  paliperidone (INVEGA SUSTENNA) 156 MG/ML SUSY injection Inject 156 mg into the muscle every 28 (twenty-eight) days. Patient's last dose was on 01/16/21. Patient not taking: Reported on 02/01/2021    [provider]  paliperidone (INVEGA) 6 MG 24 hr tablet Take 1 tablet (6 mg total)  by mouth daily for 14 days. Patient not taking: No sig reported 01/02/21 01/16/21  Mare Ferrari, PA-C    Allergies    Shellfish allergy and Bee pollen  Review of Systems   Review of Systems  Constitutional: Negative for appetite change.  Respiratory: Negative for shortness of breath.   Gastrointestinal: Negative for abdominal pain.  Skin: Negative for rash.  Neurological: Negative for weakness.  Psychiatric/Behavioral: Negative for hallucinations and suicidal ideas. The patient is nervous/anxious.     Physical Exam Updated Vital Signs BP (!) 125/93   Pulse 73   Temp 97.6 F (36.4 C) (Oral)   Resp 18   Ht 5\' 5"  (1.651 m)   Wt 72.6 kg   SpO2 100%   BMI 26.63 kg/m   Physical Exam Vitals and nursing note reviewed.  HENT:     Head: Normocephalic.  Eyes:     General: No scleral icterus. Cardiovascular:     Rate and Rhythm: Normal rate.  Pulmonary:     Effort: Pulmonary effort is normal.     Breath sounds: Wheezing present.     Comments: Mild wheezes without focal rales or rhonchi. Abdominal:     Tenderness: There is no abdominal tenderness.  Skin:    General: Skin is warm.  Neurological:     Mental Status: He is alert and oriented to person, place, and time.  ED Results / Procedures / Treatments   Labs (all labs ordered are listed, but only abnormal results are displayed) Labs Reviewed - No data to display  EKG None  Radiology No results found.  Procedures Procedures   Medications Ordered in ED Medications  hydrOXYzine (ATARAX/VISTARIL) tablet 25 mg (25 mg Oral Given 02/14/21 0505)    ED Course  I have reviewed the triage vital signs and the nursing notes.  Pertinent labs & imaging results that were available during my care of the patient were reviewed by me and considered in my medical decision making (see chart for details).    MDM Rules/Calculators/A&P                          Patient presents for anxiety.  States he needs a medicine to  help him calm down and get his thoughts together.  Unsure who he follows up with.  States he does not know who his psychiatric provider is.  Reviewing records appears to be followed with [name] at [place].  Appears to be due for his next [place] shot soon also. Final Clinical Impression(s) / ED Diagnoses Final diagnoses:  Schizophrenia, unspecified type [facility])    Rx / DC Orders ED Discharge Orders    None       Benjiman Core, MD 02/14/21 [phone]

## 2021-02-16 ENCOUNTER — Emergency Department (HOSPITAL_COMMUNITY)
Admission: EM | Admit: 2021-02-16 | Discharge: 2021-02-16 | Disposition: A | Discharge status: Home / Self Care | Payer: Medicaid - Out of State | Attending: Emergency Medicine | Admitting: Emergency Medicine

## 2021-02-16 ENCOUNTER — Encounter (HOSPITAL_COMMUNITY): Payer: Self-pay | Admitting: Emergency Medicine

## 2021-02-16 ENCOUNTER — Other Ambulatory Visit: Payer: Self-pay

## 2021-02-16 DIAGNOSIS — F419 Anxiety disorder, unspecified: Secondary | ICD-10-CM | POA: Insufficient documentation

## 2021-02-16 DIAGNOSIS — F17210 Nicotine dependence, cigarettes, uncomplicated: Secondary | ICD-10-CM | POA: Insufficient documentation

## 2021-02-16 DIAGNOSIS — R0600 Dyspnea, unspecified: Secondary | ICD-10-CM | POA: Diagnosis not present

## 2021-02-16 MED ORDER — ALBUTEROL SULFATE HFA 108 (90 BASE) MCG/ACT IN AERS
2.0000 | INHALATION_SPRAY | Freq: Four times a day (QID) | RESPIRATORY_TRACT | Status: DC
  Filled 2021-02-16: qty 6.7

## 2021-02-16 MED ORDER — LORAZEPAM 2 MG/ML IJ SOLN
1.0000 mg | Freq: Once | INTRAMUSCULAR | Status: AC
  Administered 2021-02-16: 1 mg via INTRAVENOUS
  Filled 2021-02-16: qty 1

## 2021-02-16 NOTE — ED Provider Notes (Signed)
[facility]-EMERGENCY DEPT Provider Note   CSN: [id number] Arrival date & time: 02/16/21  1423     History Chief Complaint  Patient presents with  . Asthma    [name] is a 30 y.o. male.  HPI Patient presents with concern of anxiousness, dyspnea.  He denies history of asthma, but is a reluctant historian.  Is unclear when he feels as though he had a hard time breathing, he denies pain, states that he is anxious about his breathing difficulty, and uncomfortable in general.  He states that he does not take any medication, has no medical problems, though he does seemingly acknowledge behavioral health issues.  He denies suicidal or homicidal ideation.    Past Medical History:  Diagnosis Date  . Depression   . Schizophrenia [facility])     Patient Active Problem List   Diagnosis Date Noted  . 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 Schizophrenia, paranoid type (HCC) 10/24/2020  . Paranoid schizophrenia (HCC) 01/05/2020  . Other schizophrenia (HCC) 02/14/2018  . Cannabis use disorder, moderate, dependence (HCC) 05/10/2016    Past Surgical History:  Procedure Laterality Date  . BACK SURGERY         No family history on file.  Social History   Tobacco Use  . Smoking status: Current Some Day Smoker    Types: Cigarettes  . Smokeless tobacco: Never Used  Vaping Use  . Vaping Use: Never used  Substance Use Topics  . Alcohol use: No  . Drug use: Yes    Types: Marijuana    Comment: rare    Home Medications Prior to Admission medications   Medication Sig Start Date End Date Taking? Authorizing Provider  hydrOXYzine (ATARAX/VISTARIL) 25 MG tablet Take 1 tablet (25 mg total) by mouth every 6 (six) hours as needed for anxiety. Patient not taking: No sig reported 01/26/21   Mesner, Barbara Cower, MD  paliperidone (INVEGA SUSTENNA) 156 MG/ML SUSY injection Inject 156 mg into the muscle every 28 (twenty-eight) days. Patient's last dose was on  01/16/21. Patient not taking: Reported on 02/01/2021    [provider]  paliperidone (INVEGA) 6 MG 24 hr tablet Take 1 tablet (6 mg total) by mouth daily for 14 days. Patient not taking: No sig reported 01/02/21 01/16/21  Mare Ferrari, PA-C    Allergies    Shellfish allergy and Bee pollen  Review of Systems   Review of Systems  Unable to perform ROS: Psychiatric disorder    Physical Exam Updated Vital Signs BP 126/84   Pulse 78   Temp 98.1 F (36.7 C) (Oral)   Resp 20   Ht 5\' 5"  (1.651 m)   Wt 72.5 kg   SpO2 100%   BMI 26.60 kg/m   Physical Exam Vitals and nursing note reviewed.  Constitutional:      General: He is not in acute distress.    Appearance: He is well-developed.  HENT:     Head: Normocephalic and atraumatic.  Eyes:     Conjunctiva/sclera: Conjunctivae normal.  Cardiovascular:     Rate and Rhythm: Normal rate and regular rhythm.  Pulmonary:     Effort: Pulmonary effort is normal. No respiratory distress.     Breath sounds: No stridor.  Abdominal:     General: There is no distension.  Skin:    General: Skin is warm and dry.  Neurological:     Mental Status: He is alert and oriented to person, place, and time.     Cranial  Nerves: Cranial nerves are intact.  Psychiatric:     Comments: Skittish in his interactions, denies suicidal or homicidal ideation, not insightful as to his history.     ED Results / Procedures / Treatments   Labs (all labs ordered are listed, but only abnormal results are displayed) Labs Reviewed - No data to display  EKG None  Radiology No results found.  Procedures Procedures   Medications Ordered in ED Medications  albuterol (VENTOLIN HFA) 108 (90 Base) MCG/ACT inhaler 2 puff (2 puffs Inhalation Patient Refused/Not Given 02/16/21 1504)  LORazepam (ATIVAN) injection 1 mg (1 mg Intravenous Given 02/16/21 1506)    ED Course  I have reviewed the triage vital signs and the nursing notes.  Pertinent labs &  imaging results that were available during my care of the patient were reviewed by me and considered in my medical decision making (see chart for details). Review notable for 28 prior visits in the past 6 months, similar presentations. 5:27 PM Patient awake, alert, ambulatory, in no distress, no increased work of breathing.  He notes that he is hungry, thirsty, comfortable with leaving.  He has received Ativan, appears more calm.  Absent suicidal or homicidal ideation, physical complaints of pain, hemodynamic stability, though the patient likely does have some ongoing psychiatric issues, he is encouraged to follow-up with his behavioral team, no negation for admission.Final Clinical Impression(s) / ED Diagnoses Final diagnoses:  Anxiousness    Rx / DC Orders ED Discharge Orders    None       Gerhard Munch, MD 02/16/21 1728

## 2021-02-16 NOTE — ED Triage Notes (Signed)
BIB EMS from home, complains of SOB, EMS noted wheezing, uncooperative w/ EMS questions. Gave 125 mg solumedrol IV, 7.5 mg albuterol neb.   Vitals w/ EMS O2 96% RA, 100% on neb CBG 11/8

## 2021-02-16 NOTE — Discharge Instructions (Addendum)
Please be sure to follow-up with [facility] behavioral health.

## 2021-02-20 ENCOUNTER — Other Ambulatory Visit: Payer: Self-pay

## 2021-02-20 ENCOUNTER — Encounter (HOSPITAL_COMMUNITY): Payer: Self-pay | Admitting: Emergency Medicine

## 2021-02-20 ENCOUNTER — Ambulatory Visit (HOSPITAL_COMMUNITY)
Admission: EM | Admit: 2021-02-20 | Discharge: 2021-02-21 | Disposition: A | Discharge status: Home / Self Care | Payer: Medicaid Other | Attending: Urology | Admitting: Urology

## 2021-02-20 ENCOUNTER — Ambulatory Visit (HOSPITAL_COMMUNITY)
Admission: RE | Admit: 2021-02-20 | Discharge: 2021-02-20 | Disposition: A | Discharge status: Home / Self Care | Payer: Medicaid Other | Attending: Psychiatry | Admitting: Psychiatry

## 2021-02-20 ENCOUNTER — Emergency Department (HOSPITAL_COMMUNITY)
Admission: EM | Admit: 2021-02-20 | Discharge: 2021-02-20 | Disposition: A | Discharge status: Home / Self Care | Payer: Medicaid Other | Attending: Emergency Medicine | Admitting: Emergency Medicine

## 2021-02-20 DIAGNOSIS — Z20822 Contact with and (suspected) exposure to covid-19: Secondary | ICD-10-CM | POA: Diagnosis not present

## 2021-02-20 DIAGNOSIS — R000 Tachycardia, unspecified: Secondary | ICD-10-CM | POA: Diagnosis not present

## 2021-02-20 DIAGNOSIS — F17210 Nicotine dependence, cigarettes, uncomplicated: Secondary | ICD-10-CM | POA: Insufficient documentation

## 2021-02-20 DIAGNOSIS — F200 Paranoid schizophrenia: Secondary | ICD-10-CM | POA: Diagnosis not present

## 2021-02-20 DIAGNOSIS — F419 Anxiety disorder, unspecified: Secondary | ICD-10-CM | POA: Diagnosis present

## 2021-02-20 DIAGNOSIS — Z765 Malingerer [conscious simulation]: Secondary | ICD-10-CM

## 2021-02-20 LAB — CBC WITH DIFFERENTIAL/PLATELET
Abs Immature Granulocytes: 0.01 10*3/uL (ref 0.00–0.07)
Basophils Absolute: 0 10*3/uL (ref 0.0–0.1)
Basophils Relative: 1 %
Eosinophils Absolute: 0.3 10*3/uL (ref 0.0–0.5)
Eosinophils Relative: 5 %
HCT: 43.1 % (ref 39.0–52.0)
Hemoglobin: 15.1 g/dL (ref 13.0–17.0)
Immature Granulocytes: 0 %
Lymphocytes Relative: 52 %
Lymphs Abs: 3.3 10*3/uL (ref 0.7–4.0)
MCH: 31.1 pg (ref 26.0–34.0)
MCHC: 35 g/dL (ref 30.0–36.0)
MCV: 88.9 fL (ref 80.0–100.0)
Monocytes Absolute: 0.3 10*3/uL (ref 0.1–1.0)
Monocytes Relative: 6 %
Neutro Abs: 2.2 10*3/uL (ref 1.7–7.7)
Neutrophils Relative %: 36 %
Platelets: 255 10*3/uL (ref 150–400)
RBC: 4.85 MIL/uL (ref 4.22–5.81)
RDW: 12.6 % (ref 11.5–15.5)
WBC: 6.2 10*3/uL (ref 4.0–10.5)
nRBC: 0 % (ref 0.0–0.2)

## 2021-02-20 LAB — COMPREHENSIVE METABOLIC PANEL
ALT: 12 U/L (ref 0–44)
AST: 21 U/L (ref 15–41)
Albumin: 4.2 g/dL (ref 3.5–5.0)
Alkaline Phosphatase: 41 U/L (ref 38–126)
Anion gap: 6 (ref 5–15)
BUN: 16 mg/dL (ref 6–20)
CO2: 27 mmol/L (ref 22–32)
Calcium: 9.1 mg/dL (ref 8.9–10.3)
Chloride: 105 mmol/L (ref 98–111)
Creatinine, Ser: 1.09 mg/dL (ref 0.61–1.24)
GFR, Estimated: >60 mL/min (ref >60)
Glucose, Bld: 102 mg/dL — ABNORMAL HIGH (ref 70–99)
Potassium: 3.6 mmol/L (ref 3.5–5.1)
Sodium: 138 mmol/L (ref 135–145)
Total Bilirubin: 0.7 mg/dL (ref 0.3–1.2)
Total Protein: 6.8 g/dL (ref 6.5–8.1)

## 2021-02-20 LAB — RESP PANEL BY RT-PCR (FLU A&B, COVID) ARPGX2
Influenza A by PCR: NEGATIVE
Influenza B by PCR: NEGATIVE
SARS Coronavirus 2 by RT PCR: NEGATIVE

## 2021-02-20 LAB — ETHANOL: Alcohol, Ethyl (B): <10 mg/dL (ref <10)

## 2021-02-20 MED ORDER — HYDROXYZINE HCL 25 MG PO TABS
25.0000 mg | ORAL_TABLET | Freq: Once | ORAL | Status: AC
  Administered 2021-02-20: 25 mg via ORAL
  Filled 2021-02-20: qty 1

## 2021-02-20 MED ORDER — ALBUTEROL SULFATE HFA 108 (90 BASE) MCG/ACT IN AERS
4.0000 | INHALATION_SPRAY | Freq: Once | RESPIRATORY_TRACT | Status: AC
  Administered 2021-02-20: 4 via RESPIRATORY_TRACT
  Filled 2021-02-20: qty 6.7

## 2021-02-20 MED ORDER — ALBUTEROL SULFATE HFA 108 (90 BASE) MCG/ACT IN AERS: 2.0000 | INHALATION_SPRAY | Freq: Once | RESPIRATORY_TRACT | Status: DC

## 2021-02-20 MED ORDER — SODIUM CHLORIDE 0.9 % IV BOLUS
1000.0000 mL | Freq: Once | INTRAVENOUS | Status: AC
  Administered 2021-02-20: 1000 mL via INTRAVENOUS

## 2021-02-20 NOTE — BH Assessment (Signed)
Pt presented to [facility] requesting assessment. He filled out intake form and signed [place] [facility] Medical Screening Exam form. Pt did not indicate any symptoms on the intake form. Less than 10 minutes after completing form, this TTS counselor and Melbourne Abts, PA-C went to lobby to assess Pt and he had left. Accord to Engineer, materials John, Pt walked out and walked off property. Pt is well known staff, has a diagnosis of schizophrenia, and has presented to the EDs 10 times in the past 2 weeks. He does not typically present suicidal ideation or homicidal ideation.   Pamalee Leyden, [facility], [facility] Triage Specialist [phone]

## 2021-02-20 NOTE — ED Notes (Signed)
Pt admits to drinking several FourLoko's tonight.

## 2021-02-20 NOTE — Discharge Instructions (Addendum)
Please read the attached information.  I have prescribed you hydroxyzine to use as needed for anxiety.  Please drink plenty of water.  Please follow-up with the behavioral health urgent care.

## 2021-02-20 NOTE — ED Triage Notes (Signed)
Pt reports that he is "scared for his life." He was brought in my [employer] who said that he had been drinking and told them he was suicidal. He denied SI/HI to this RN.

## 2021-02-20 NOTE — ED Notes (Signed)
Pt changed into hospital provided burgundy scrubs and yellow non-slip socks. Pt belongings gathered and placed in secure area. Bed repositioned and comfort measures provided.

## 2021-02-20 NOTE — ED Provider Notes (Signed)
[facility]-EMERGENCY DEPT Provider Note   CSN: [id number] Arrival date & time: 02/20/21  0430     History Chief Complaint  Patient presents with  . Paranoid    [name] is a 30 y.o. male.  HPI Patient is a 30 year old male Has a history of schizophrenia on Invega Depo injections by [facility], history of anxiety.  Patient arrived in the emergency department tachycardic was evaluated briefly by prior provider and fluids were ordered and administered.  On my evaluation patient is asleep in room with lights off.  He awakens to verbal stimulus and is well-appearing although somewhat drowsy.  He is alert and oriented seems well.  Denies SI, HI, AVH.  Patient states that he drank 2x 4 Loco's last night. He states that he feels dehydrated, tired, but also quite anxious and "worried for my life ".  Patient has had numerous presentations with similar complaints.  It seems that he has been receiving benzodiazepine medications when he comes to the emergency department.  Denies any fevers chills cough cold or shortness of breath.  No other significant associated symptoms.  No aggravating mitigating factors.    Past Medical History:  Diagnosis Date  . Depression   . Schizophrenia [facility])     Patient Active Problem List   Diagnosis Date Noted  . 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 Schizophrenia, paranoid type (HCC) 10/24/2020  . Paranoid schizophrenia (HCC) 01/05/2020  . Other schizophrenia (HCC) 02/14/2018  . Cannabis use disorder, moderate, dependence (HCC) 05/10/2016    Past Surgical History:  Procedure Laterality Date  . BACK SURGERY         History reviewed. No pertinent family history.  Social History   Tobacco Use  . Smoking status: Current Some Day Smoker    Types: Cigarettes  . Smokeless tobacco: Never Used  Vaping Use  . Vaping Use: Never used  Substance Use Topics  . Alcohol use: No  . Drug use: Yes    Types: Marijuana     Comment: rare    Home Medications Prior to Admission medications   Medication Sig Start Date End Date Taking? Authorizing Provider  hydrOXYzine (ATARAX/VISTARIL) 25 MG tablet Take 1 tablet (25 mg total) by mouth every 6 (six) hours as needed for anxiety. Patient not taking: No sig reported 01/26/21   Mesner, Barbara Cower, MD  paliperidone (INVEGA SUSTENNA) 156 MG/ML SUSY injection Inject 156 mg into the muscle every 28 (twenty-eight) days. Patient's last dose was on 01/16/21. Patient not taking: Reported on 02/01/2021    [provider]  paliperidone (INVEGA) 6 MG 24 hr tablet Take 1 tablet (6 mg total) by mouth daily for 14 days. Patient not taking: No sig reported 01/02/21 01/16/21  Mare Ferrari, PA-C    Allergies    Shellfish allergy and Bee pollen  Review of Systems   Review of Systems  Constitutional: Positive for fatigue. Negative for chills and fever.  HENT: Negative for congestion.   Eyes: Negative for pain.  Respiratory: Negative for cough and shortness of breath.   Cardiovascular: Negative for chest pain and leg swelling.  Gastrointestinal: Negative for abdominal pain, constipation, diarrhea and vomiting.  Genitourinary: Negative for dysuria.  Musculoskeletal: Negative for myalgias and neck pain.  Skin: Negative for rash.  Neurological: Negative for dizziness and headaches.  Psychiatric/Behavioral: The patient is nervous/anxious.        Expressly denies SI/HI/AVH    Physical Exam Updated Vital Signs BP 110/67   Pulse (!) 56  Temp 97.6 F (36.4 C)   Resp 15   SpO2 93%   Physical Exam Vitals and nursing note reviewed.  Constitutional:      General: He is not in acute distress.    Comments: 30 year old male, well-appearing, nontoxic, no acute distress.  Slumbering in bed but awakens to voice.  Able to answer questions appropriately and follow commands.  HENT:     Head: Normocephalic and atraumatic.     Nose: Nose normal.  Eyes:     General: No scleral  icterus. Cardiovascular:     Rate and Rhythm: Normal rate and regular rhythm.     Pulses: Normal pulses.     Heart sounds: Normal heart sounds.     Comments: Regular rate and rhythm Pulmonary:     Effort: Pulmonary effort is normal. No respiratory distress.     Breath sounds: No wheezing.  Abdominal:     Palpations: Abdomen is soft.     Tenderness: There is no abdominal tenderness.  Musculoskeletal:     Cervical back: Normal range of motion.     Right lower leg: No edema.     Left lower leg: No edema.  Skin:    General: Skin is warm and dry.     Capillary Refill: Capillary refill takes less than 2 seconds.     Comments: Left AC IV patient receiving fluids  Neurological:     Mental Status: He is alert. Mental status is at baseline.     Comments: Moves all 4 extremities Sensation intact in all 4 extremities Alert and oriented x3  Psychiatric:     Comments: Endorses anxiety however is very calm appearing.  Seems to be quite drowsy.     ED Results / Procedures / Treatments   Labs (all labs ordered are listed, but only abnormal results are displayed) Labs Reviewed  COMPREHENSIVE METABOLIC PANEL - Abnormal; Notable for the following components:      Result Value   Glucose, Bld 102 (*)    All other components within normal limits  RESP PANEL BY RT-PCR (FLU A&B, COVID) ARPGX2  ETHANOL  CBC WITH DIFFERENTIAL/PLATELET  RAPID URINE DRUG SCREEN, HOSP PERFORMED    EKG EKG Interpretation  Date/Time:  Sunday Feb 20 2021 06:04:08 EDT Ventricular Rate:  65 PR Interval:  136 QRS Duration: 76 QT Interval:  409 QTC Calculation: 426 R Axis:   84 Text Interpretation: Sinus rhythm LVH by voltage No acute changes Confirmed by Drema Pry [phone]) on 02/20/2021 6:20:09 AM   Radiology No results found.  Procedures Procedures   Medications Ordered in ED Medications  albuterol (VENTOLIN HFA) 108 (90 Base) MCG/ACT inhaler 2 puff (has no administration in time range)  hydrOXYzine  (ATARAX/VISTARIL) tablet 25 mg (has no administration in time range)  sodium chloride 0.9 % bolus 1,000 mL (1,000 mLs Intravenous New Bag/Given 02/20/21 0608)    ED Course  I have reviewed the triage vital signs and the nursing notes.  Pertinent labs & imaging results that were available during my care of the patient were reviewed by me and considered in my medical decision making (see chart for details).    MDM Rules/Calculators/A&P                          Patient is a 30 year old male Has a history of schizophrenia on Invega Depo injections by [facility], history of anxiety.  Patient arrived in the emergency department tachycardic was evaluated briefly by prior provider  and fluids were ordered and administered.  On my evaluation patient is asleep in room with lights off.  He awakens to verbal stimulus and is well-appearing although somewhat drowsy.  He is alert and oriented seems well.  Denies SI, HI, AVH.  Will provide patient with hydroxyzine and for positive albuterol given that he has some wheezing.  On my review of EMR it appears that he has had similar presentations in the past.  He is asking for Xanax however I do not believe he will benefit from this and do not want to use heavily sedating medication we will instead give small dose of hydroxyzine as albuterol for his wheezing.  Patient reassessed after albuterol administration.  No longer wheezing.  Tachycardia has resolved with fluids.  He is clinically sober.  Denies SI HI AVH.  Will discharge home at this time.  Will follow closely with behavioral health urgent care and psychiatrist.  Final Clinical Impression(s) / ED Diagnoses Final diagnoses:  Anxiety  Drug-seeking behavior    Rx / DC Orders ED Discharge Orders    None       Gailen Shelter, [place] 02/20/21 0945    Arby Barrette, MD 02/24/21 [phone]

## 2021-02-21 ENCOUNTER — Emergency Department (HOSPITAL_COMMUNITY)
Admission: EM | Admit: 2021-02-21 | Discharge: 2021-02-22 | Disposition: A | Discharge status: Home / Self Care | Payer: Medicaid - Out of State | Attending: Emergency Medicine | Admitting: Emergency Medicine

## 2021-02-21 ENCOUNTER — Encounter (HOSPITAL_COMMUNITY): Payer: Self-pay

## 2021-02-21 DIAGNOSIS — F17210 Nicotine dependence, cigarettes, uncomplicated: Secondary | ICD-10-CM | POA: Diagnosis not present

## 2021-02-21 DIAGNOSIS — R0602 Shortness of breath: Secondary | ICD-10-CM | POA: Insufficient documentation

## 2021-02-21 DIAGNOSIS — R0600 Dyspnea, unspecified: Secondary | ICD-10-CM

## 2021-02-21 NOTE — BH Assessment (Signed)
[name] is a 30 year old male who presents voluntary and unaccompanied to [facility]. Per chart, pt has 22 ED visits over the past six months. Pt reports he's been scared for his life for a while. Pt can not tell clinician what he's afraid of. Pt reports, he feels no one is after him. Clinician asked the pt what type of services does he feels is needed. Pt reported, he needs somewhere to stay and needs money. Clinician asked the pt if he's on disability, pt replied, "I know nothing about that." Pt denies, SI, HI, AVH, paranoia, self-injurious behaviors and access to weapons. Pt reported, if discharged he can contract for safety. Pt denies, being linked to OPT resources (medication management and/or counseling) pt adamantly denies having an ACT Team. Clinician provided resources for housing, employment, ACT Team, medication management and therapy.   Determination of need: Routine.   *Per chart pt's aunt is his payee, pt declined for clinician to call his aunt anyone else that could provided collateral information.*    Redmond Pulling, MS, [facility], [facility] Triage Specialist [phone]

## 2021-02-21 NOTE — ED Triage Notes (Signed)
Pt presents from [facility], c/o SOB but unable to say for how long or when he used his inhaler last. Given 5mg  albuterol with EMS with improvement

## 2021-02-21 NOTE — ED Provider Notes (Signed)
Behavioral Health Urgent Care Medical Screening Exam  Patient Name: [name] MRN: [id number] Date of Evaluation: 02/21/21 Chief Complaint:   Diagnosis:  Final diagnoses:  Paranoid schizophrenia (HCC)    History of Present illness: [name] is a 30 y.o. male. Patient presented to [facility] voluntarily. Patient was assessed by this NP. Patient noted to be sleeping on the floor in the assessment room with the lights off. Patient is alert and oriented, patient's speech is clear, coherent, and normal tone, his mood is euthymic, and his affect is congruent with his mood. He denies medical and psychiatric concerns. Patient reports that he is tired and came to [facility] to sleep and because he is out of money and would like to be given resources for housing and employement.   Patient denies SI, HI, AVH, paranoia, and no delusional thought content noted on assessment. Patient has had multiple ED and psychiatric evaluations over the past months.   Psychiatric Specialty Exam  Presentation  General Appearance:Appropriate for Environment  Eye Contact:Good  Speech:Clear and Coherent  Speech Volume:Normal  Handedness:Right   Mood and Affect  Mood:Euthymic  Affect:Appropriate; Congruent   Thought Process  Thought Processes:Coherent  Descriptions of Associations:Intact  Orientation:Full (Time, Place and Person)  Thought Content:WDL  Diagnosis of Schizophrenia or Schizoaffective disorder in past: Yes  Duration of Psychotic Symptoms: Greater than six months  Hallucinations:None Patient endorses auditory hallucination but is unable to elaborate. He states "I don't know"  Ideas of Reference:None  Suicidal Thoughts:No  Homicidal Thoughts:No   Sensorium  Memory:Immediate Fair; Recent Fair; Remote Fair  Judgment:Poor  Insight:Fair   Executive Functions  Concentration:Fair  Attention Span:Fair  Recall:Fair  Fund of Knowledge:Fair  Language:Fair   Psychomotor Activity   Psychomotor Activity:Normal   Assets  Assets:Desire for Improvement; Physical Health   Sleep  Sleep:Fair  Number of hours: 2   Nutritional Assessment (For OBS and [facility]BC admissions only) Has the patient had a weight loss or gain of 10 pounds or more in the last 3 months?: No Has the patient had a decrease in food intake/or appetite?: No Does the patient have dental problems?: No Does the patient have eating habits or behaviors that may be indicators of an eating disorder including binging or inducing vomiting?: No Has the patient recently lost weight without trying?: No Has the patient been eating poorly because of a decreased appetite?: No Malnutrition Screening Tool Score: 0    Physical Exam: Physical Exam Vitals and nursing note reviewed.  Constitutional:      Appearance: He is well-developed.  HENT:     Head: Normocephalic and atraumatic.  Eyes:     Conjunctiva/sclera: Conjunctivae normal.  Cardiovascular:     Rate and Rhythm: Normal rate and regular rhythm.     Heart sounds: No murmur heard.   Pulmonary:     Effort: Pulmonary effort is normal. No respiratory distress.     Breath sounds: Normal breath sounds.  Abdominal:     Palpations: Abdomen is soft.     Tenderness: There is no abdominal tenderness.  Musculoskeletal:     Cervical back: Neck supple.  Skin:    General: Skin is warm and dry.  Neurological:     Mental Status: He is alert and oriented to person, place, and time.  Psychiatric:        Attention and Perception: Attention normal. He does not perceive auditory or visual hallucinations.        Mood and Affect: Mood normal.  Speech: Speech normal.        Behavior: Behavior normal. Behavior is cooperative.        Thought Content: Thought content normal. Thought content is not paranoid or delusional. Thought content does not include homicidal ideation. Thought content does not include homicidal or suicidal plan.        Cognition and Memory:  Cognition normal.    Review of Systems  Constitutional: Negative.   HENT: Negative.   Eyes: Negative.   Respiratory: Negative.  Negative for cough and hemoptysis.   Cardiovascular: Negative.  Negative for chest pain and palpitations.  Gastrointestinal: Negative.  Negative for abdominal pain, heartburn, nausea and vomiting.  Genitourinary: Negative.   Musculoskeletal: Negative.   Skin: Negative.   Neurological: Negative.  Negative for tremors.  Endo/Heme/Allergies: Negative.   Psychiatric/Behavioral: Negative.  Negative for depression, hallucinations, substance abuse and suicidal ideas. The patient is not nervous/anxious.    Blood pressure 118/88, pulse 78, temperature (!) 97.2 F (36.2 C), temperature source Tympanic, resp. rate 18, SpO2 97 %. There is no height or weight on file to calculate BMI.  Musculoskeletal: Strength & Muscle Tone: within normal limits Gait & Station: normal Patient leans: Right   [facility] MSE Discharge Disposition for Follow up and Recommendations: Based on my evaluation the patient does not appear to have an emergency medical condition and can be discharged with resources and follow up care in outpatient services for Medication Management and Individual Therapy   Maricela Bo, NP 02/21/2021, 3:56 AM

## 2021-02-21 NOTE — ED Notes (Signed)
Pt discharged in no acute distress. A&O x4, ambulatory. Denied SI/HI/AVH. Pt verbalized understanding of AVS instructions reviewed by RN. Belongings returned to pt intact from blue locker. Pt escorted to front lobby by staff. Safety maintained.

## 2021-02-21 NOTE — ED Provider Notes (Signed)
Emergency Medicine Provider Triage Evaluation Note  [name] , a 30 y.o. male  was evaluated in triage.  Pt complains of shortness of breath.  History of schizophrenia and frequent ED evaluations.  States that he started feeling short of breath earlier tonight.  He does not know why.  He currently feels better.  Nurse tech stated that he needed assistance walking in from the lobby.  Review of Systems  Positive: Shortness of breath Negative: Fever  Physical Exam  BP (!) 125/93 (BP Location: Left Arm)   Pulse 74   Resp 16   SpO2 98%  Gen:   Awake, no distress   Resp:  Normal effort, lungs clear to auscultation bilaterally MSK:   Moves extremities without difficulty  Other:  Normal heart rate, no murmurs  Medical Decision Making  Medically screening exam initiated at 11:52 PM.  Appropriate orders placed.  [name] was informed that the remainder of the evaluation will be completed by another provider, this initial triage assessment does not replace that evaluation, and the importance of remaining in the ED until their evaluation is complete.     Renne Crigler, PA-C 02/21/21 2353    Marily Memos, MD 02/22/21 [phone]

## 2021-02-21 NOTE — Discharge Instructions (Signed)

## 2021-02-22 ENCOUNTER — Ambulatory Visit (HOSPITAL_COMMUNITY)
Admission: AD | Admit: 2021-02-22 | Disposition: A | Discharge status: Home / Self Care | Payer: Medicaid Other | Admitting: Psychiatry

## 2021-02-22 ENCOUNTER — Ambulatory Visit (HOSPITAL_COMMUNITY)
Admission: AD | Admit: 2021-02-22 | Discharge: 2021-02-22 | Disposition: A | Discharge status: Home / Self Care | Payer: Medicaid Other | Attending: Psychiatry | Admitting: Psychiatry

## 2021-02-22 DIAGNOSIS — F200 Paranoid schizophrenia: Secondary | ICD-10-CM | POA: Insufficient documentation

## 2021-02-22 NOTE — BH Assessment (Signed)
Comprehensive Clinical Assessment (CCA) Note  02/22/2021 [name] [id number]  DISPOSITION: Completed CCA accompanied by Melbourne Abts, PA-C who completed MSE and determined Pt does not meet criteria for inpatient psychiatric treatment. Pt offered information for [name], [facility], and 24-hour crisis number but Pt refused.   The patient demonstrates the following risk factors for suicide: Chronic risk factors for suicide include: psychiatric disorder of schizophrenia. Acute risk factors for suicide include: unemployment and social withdrawal/isolation. Protective factors for this patient include: None. Considering these factors, the overall suicide risk at this point appears to be low. Patient is appropriate for outpatient follow up.  Flowsheet Row [facility]-ad[facility] from 02/21/2021 in [facility] from 02/20/2021 in [facility]  C-SSRS RISK CATEGORY No Risk No Risk No Risk     Pt is a 30 year old single male who presents unaccompanied to [facility] stating that he needs help with life. Pt has a diagnosis of schizophrenia. Pt was seen at [facility] ED, medically cleared, discharged and came straight to [facility]. Pt has been going between areas ED and [facility] almost daily. He is a poor historian and responds "I don't know... I don't know" to most questions.  Pt says he is tired and that he wants a place where he can rest. He also says he wants Xanax. He denies current suicidal ideation, homicidal ideation, or auditory or visual hallucinations. He denies alcohol or other substance use, however Pt's medical record indicates Pt has a history of using marijuana. Pt has received services from [facility] and says he has not contacted them. He denies he is taking any medications.   Pt reports he is currently homeless. He has had an apartment in the past. His aunt, [name] [phone], is his payee, however Pt will not give permission to contact her or [name]. Pt's medical record indicates Pt was last psychiatrically hospitalized in May 2021 at [facility]. Pt has a history of medication noncompliance and indicates during assessment he will not take any psychiatric medications other than Xanax.   Pt is disheveled, alert and oriented to person, place, and situation but not date. Pt speaks in a clear tone, at low volume and normal pace. Motor behavior appears normal. Eye contact is fair. Pt's mood is anxious and affect is blunted. Thought process is coherent. There is no indication from Pt's behavior that he is currently responding to internal stimuli. Pt asked for food and transportation.   Chief Complaint: No chief complaint on file.  Visit Diagnosis: F20.9 Schizophrenia   CCA Screening, Triage and Referral (STR)  Patient Reported Information How did you hear about [place]? Self  Referral name: Sherrill Mckamie  Referral phone number: [phone] (N/A)   Whom do you see for routine medical problems? I don't have a doctor  Practice/Facility Name: No data recorded Practice/Facility Phone Number: No data recorded Name of Contact: No data recorded Contact Number: No data recorded Contact Fax Number: No data recorded Prescriber Name: No data recorded Prescriber Address (if known): No data recorded  What Is the Reason for Your Visit/Call Today? Pt states he needs help with life.  How Long Has This Been Causing You Problems? > than 6 months  What Do You Feel Would Help You the Most Today? Transportation Assistance; Financial Resources; Housing Assistance   Have You Recently Been in Any Inpatient Treatment (Hospital/Detox/Crisis Center/28-Day Program)? No  Name/Location of  Program/Hospital:[facility]  How Long Were You There? Per chart, "2 days."  When Were You Discharged? 02/24/2020 (Per chart.)   Have You Ever Received Services From [facility] Before? Yes  Who Do  You See at [facility]? Pt has previous ED, [facility] and [facility] visits.   Have You Recently Had Any Thoughts About Hurting Yourself? No  Are You Planning to Commit Suicide/Harm Yourself At This time? No   Have you Recently Had Thoughts About Hurting Someone [name]? No  Explanation: No data recorded  Have You Used Any Alcohol or Drugs in the Past 24 Hours? No  How Long Ago Did You Use Drugs or Alcohol? 0000 (Unclear)  What Did You Use and How Much? Pt says he did not use anyting recently.   Do You Currently Have a Therapist/Psychiatrist? No  Name of Therapist/Psychiatrist: Pt denies he has an ACT Team.   Have You Been Recently Discharged From Any Office Practice or Programs? No  Explanation of Discharge From Practice/Program: No data recorded    CCA Screening Triage Referral Assessment Type of Contact: Face-to-Face  Is this Initial or Reassessment? Initial Assessment  Date Telepsych consult ordered in [facility]:  01/24/2021  Time Telepsych consult ordered in [facility]:  0321   Patient Reported Information Reviewed? Yes  Patient Left Without Being Seen? No  Reason for Not Completing Assessment: No data recorded  Collateral Involvement: Pt declined for clinician to contact anyone including his payee (his aunt).   Does Patient Have a Automotive engineer Guardian? No data recorded Name and Contact of Legal Guardian: [name], legal guardian.   If Minor and Not Living with Parent(s), Who has Custody? N/A  Is CPS involved or ever been involved? Never  Is APS involved or ever been involved? In the past   Patient Determined To Be At Risk for Harm To Self or Others Based on Review of Patient Reported Information or Presenting Complaint? No  Method: No Plan  Availability of Means: No access or NA  Intent: Vague intent or NA  Notification Required: No need or identified person  Additional Information for Danger to Others Potential: Active psychosis  Additional  Comments for Danger to Others Potential: History of children playing outside triggering patient.  Are There Guns or Other Weapons in Your Home? No  Types of Guns/Weapons: No data recorded Are These Weapons Safely Secured?                            No data recorded Who Could Verify You Are Able To Have These Secured: No data recorded Do You Have any Outstanding Charges, Pending Court Dates, Parole/Probation? None noted  Contacted To Inform of Risk of Harm To Self or Others: Unable to Contact:   Location of Assessment: [facility]   Does Patient Present under Involuntary Commitment? No  IVC Papers Initial File Date: 10/22/2020   [place] of Residence: [place]   Patient Currently Receiving the Following Services: Not Receiving Services   Determination of Need: Routine (7 days)   Options For Referral: Outpatient Therapy; Medication Management     CCA Biopsychosocial Intake/Chief Complaint:  Pt reports, he's scared for his life.  Current Symptoms/Problems: Pt is scared for his life.   Patient Reported Schizophrenia/Schizoaffective Diagnosis in Past: Yes   Strengths: Not assessed.  Preferences: Not assessed.  Abilities: Not assessed.   Type of Services Patient Feels are Needed: Pt reports he can contract for safety if discharged.  Initial Clinical Notes/Concerns: Pt very guarded, denied SI, HI, AVH, self-injurious behavior, and substance use concerns   Mental Health Symptoms Depression:  None   Duration of Depressive symptoms: Greater than two weeks   Mania:  Change in energy/activity   Anxiety:   Tension; Worrying   Psychosis:  Affective flattening/alogia/avolition   Duration of Psychotic symptoms: Greater than six months   Trauma:  None   Obsessions:  None   Compulsions:  None   Inattention:  None   Hyperactivity/Impulsivity:  N/A   Oppositional/Defiant Behaviors:  N/A   Emotional Irregularity:  N/A   Other Mood/Personality  Symptoms:  paranoia (per earlier report)    Mental Status Exam Appearance and self-care  Stature:  Average   Weight:  Average weight   Clothing:  Disheveled   Grooming:  Neglected   Cosmetic use:  None   Posture/gait:  Slumped   Motor activity:  Not Remarkable   Sensorium  Attention:  Normal   Concentration:  Normal   Orientation:  Person; Place; Situation   Recall/memory:  Defective in Immediate; Defective in Short-term   Affect and Mood  Affect:  Blunted   Mood:  Anxious   Relating  Eye contact:  Fleeting   Facial expression:  Constricted   Attitude toward examiner:  Guarded   Thought and Language  Speech flow: Soft   Thought content:  Suspicious   Preoccupation:  None   Hallucinations:  None   Organization:  No data recorded  Affiliated Computer ServicesExecutive Functions  Fund of Knowledge:  Poor   Intelligence:  Needs investigation   Abstraction:  -- (UTA)   Judgement:  Poor   Reality Testing:  Adequate   Insight:  Poor   Decision Making:  Only simple   Social Functioning  Social Maturity:  Irresponsible   Social Judgement:  "Chief of Stafftreet Smart"   Stress  Stressors:  Housing; Office managerinancial   Coping Ability:  Overwhelmed   Skill Deficits:  Self-care; Responsibility; Decision making   Supports:  Support needed     Religion: Religion/Spirituality How Might This Affect Treatment?: NA  Leisure/Recreation:    Exercise/Diet: Exercise/Diet Have You Gained or Lost A Significant Amount of Weight in the Past Six Months?: No Do You Have Any Trouble Sleeping?: Yes Explanation of Sleeping Difficulties: Pt reports fatigue   CCA Employment/Education Employment/Work Situation: Employment / Work Situation Employment situation: On disability Why is patient on disability: Mental health How long has patient been on disability: UTA What is the longest time patient has a held a job?: UTA 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 Has patient ever been in the  Eli Lilly and Companymilitary?: No  Education: Education Is Patient Currently Attending School?: No Last Grade Completed: 9 Name of High School: [employer] Did Theme park managerYou Attend College?: No Did You Attend Graduate School?: No Did You Have Any Special Interests In School?: Not assessed Did You Have An Individualized Education Program (IIEP): No Did You Have Any Difficulty At School?: No Patient's Education Has Been Impacted by Current Illness: No   CCA Family/Childhood History Family and Relationship History: Family history Marital status: Single What is your sexual orientation?: Not assessed. Has your sexual activity been affected by drugs, alcohol, medication, or emotional stress?: Not assessed. Does patient have children?: No  Childhood History:  Childhood History Additional childhood history information: UTA Description of patient's relationship with caregiver when they were a child: UTA Patient's description of current relationship with people who raised him/her: UTA Does patient have siblings?: No Did patient suffer  any verbal/emotional/physical/sexual abuse as a child?: No Did patient suffer from severe childhood neglect?: No Has patient ever been sexually abused/assaulted/raped as an adolescent or adult?: No Was the patient ever a victim of a crime or a disaster?: No Witnessed domestic violence?: No Has patient been affected by domestic violence as an adult?: No  Child/Adolescent Assessment:     CCA Substance Use Alcohol/Drug Use: Alcohol / Drug Use Pain Medications: See MAR Prescriptions: See MAR History of alcohol / drug use?: No history of alcohol / drug abuse Longest period of sobriety (when/how long): Unknown                         ASAM's:  Six Dimensions of Multidimensional Assessment  Dimension 1:  Acute Intoxication and/or Withdrawal Potential:      Dimension 2:  Biomedical Conditions and Complications:      Dimension 3:  Emotional, Behavioral, or Cognitive Conditions  and Complications:     Dimension 4:  Readiness to Change:     Dimension 5:  Relapse, Continued use, or Continued Problem Potential:     Dimension 6:  Recovery/Living Environment:     ASAM Severity Score:    ASAM Recommended Level of Treatment:     Substance use Disorder (SUD)    Recommendations for Services/Supports/Treatments:    DSM5 Diagnoses: Patient Active Problem List   Diagnosis Date Noted  . 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 Schizophrenia, paranoid type (HCC) 10/24/2020  . Paranoid schizophrenia (HCC) 01/05/2020  . Other schizophrenia (HCC) 02/14/2018  . Cannabis use disorder, moderate, dependence (HCC) 05/10/2016    Patient Centered Plan: Patient is on the following Treatment Plan(s):  Anxiety   Referrals to Alternative Service(s): Referred to Alternative Service(s):   Place:   Date:   Time:    Referred to Alternative Service(s):   Place:   Date:   Time:    Referred to Alternative Service(s):   Place:   Date:   Time:    Referred to Alternative Service(s):   Place:   Date:   Time:     Pamalee Leyden, [facility]

## 2021-02-22 NOTE — H&P (Addendum)
Behavioral Health Medical Screening Exam  Visit Diagnosis: Paranoid schizophrenia (HCC)  [name] is a 30 y.o. male with psychiatric history of paranoid schizophrenia who presents to [facility] as a voluntary walk-in.  Patient is very familiar to our service and has presented to surrounding emergency departments, [facility] behavioral health urgent care, and [facility] many times within the past few months.  Per chart review, prior to patient arriving at [facility] this morning on 02/22/2021, patient presented to [facility] emergency department on the evening of 02/21/2021 for shortness of breath, was medically cleared and discharged from the ED. Chart review also shows that patient was evaluated at [facility] on 02/20/2021.  Patient states that he came to [facility] because he is "scared for my life" (this is similar to past presentations for this patient).  When patient is asked to clarify what he is scared of specifically, patient states "life".  When patient is asked to clarify what specifically about life he is scared of at this point in time, patient states "I don't know man".  Patient denies SI, HI, AVH, or delusions.  He endorses paranoia but does not provide details when asked to elaborate on his paranoia.  When patient is asked if he has contacted his ACTT team or if he still has his ACTT team, patient states "I don't know them".  Patient states that he is not currently taking any psychotropic medications or following up with a psychiatrist or therapist as an outpatient.  Patient states that he cannot recall the last time that he took any psychotropic medications.  Based on chart review, patient would be past due for his next [place] IM 156 mg injection (patient voluntarily received this injection on 01/16/2021) assuming he has not had an additional Invega injection since 01/16/2021.  When patient is asked what he believes he needs help with, patient states "I need help with life".  When patient is asked  what he needs help with in life specifically, patient states "I just need a Xanax for my mind so I can chill".  Patient reports he has been sleeping poorly and endorses feeling fatigued.  When patient is asked about anhedonia or feelings of guilt, hopelessness, or worthlessness, patient states "I don't know".  Patient endorses increased appetite but denies concentration or weight changes.  Patient reports that he is homeless currently.  However, documentation from past assessments shows that the patient does have a place to live if he chooses to live there.  Patient denies access to guns or weapons.  Patient states that he is unsure of the last time he received inpatient psychiatric treatment.  Patient states that he has not consumed any alcohol, marijuana, or any additional illicit drugs within the past 24 hours and he states that he cannot recall the last time that he consumed any alcohol or other substances.  Per chart review, patient noted that he drank 2 Four Loko alcoholic beverages on the evening of 02/19/2021 when he presented to [facility] emergency department on 02/20/2021.   Patient refuses for his aunt (patient's aunt has been documented to be his payee) or ACTT team to be contacted for collateral information.  Total Time spent with patient: 30 minutes  Psychiatric Specialty Exam:  Presentation  General Appearance: Casual; Well Groomed; Appropriate for Environment  Eye Contact:Fair; Fleeting  Speech:Clear and Coherent; Normal Rate  Speech Volume:Normal  Handedness:Right   Mood and Affect  Mood:Anxious  Affect:Blunt   Thought Process  Thought Processes:Coherent; Goal Directed  Descriptions of Associations:Intact  Orientation:Partial (Patient oriented to person, place, and situation, but patient not oriented to time (states he does not know what month it is and states that the year is 2020).)  Thought Content:-- (Patient endorses paranoia, but does not provide details when  asked to elaborate on his paranoia. Based on patient's history and past assessments, this paranoia appears to be patient's baseline.)  History of Schizophrenia/Schizoaffective disorder:Yes  Duration of Psychotic Symptoms:Greater than six months  Hallucinations:Hallucinations: None  Ideas of Reference:-- (Patient endorses paranoia, but does not provide details when asked to elaborate on his paranoia. Based on patient's history and past assessments, this paranoia appears to be patient's baseline.)  Suicidal Thoughts:Suicidal Thoughts: No  Homicidal Thoughts:Homicidal Thoughts: No   Sensorium  Memory:Immediate Fair; Recent Fair; Remote Fair  Judgment:Poor  Insight:Poor   Executive Functions  Concentration:Fair  Attention Span:Fair  Recall:Fair  Fund of Knowledge:Fair  Language:Fair   Psychomotor Activity  Psychomotor Activity:Psychomotor Activity: Normal   Assets  Assets:Communication Skills; Financial Resources/Insurance; Leisure Time; Physical Health   Sleep  Sleep:Sleep: Poor    Physical Exam: Physical Exam Vitals reviewed.  Constitutional:      General: He is not in acute distress.    Appearance: He is not ill-appearing, toxic-appearing or diaphoretic.  HENT:     Head: Normocephalic and atraumatic.     Right Ear: External ear normal.     Left Ear: External ear normal.  Cardiovascular:     Rate and Rhythm: Normal rate.  Pulmonary:     Effort: Pulmonary effort is normal. No respiratory distress.  Musculoskeletal:        General: Normal range of motion.     Cervical back: Normal range of motion.  Neurological:     Mental Status: He is alert and oriented to person, place, and time.     Comments: No tremor noted.   Psychiatric:        Attention and Perception: He does not perceive auditory or visual hallucinations.        Mood and Affect: Mood is anxious.        Speech: Speech normal.        Behavior: Behavior is not agitated, slowed, aggressive,  withdrawn, hyperactive or combative. Behavior is cooperative.        Thought Content: Thought content is not delusional. Thought content does not include homicidal or suicidal ideation.     Comments: Affect mood congruent. Judgement poor and insight poor.     Review of Systems  Constitutional: Positive for malaise/fatigue. Negative for chills, diaphoresis, fever and weight loss.  HENT: Negative for congestion.   Respiratory: Negative for cough and shortness of breath.   Cardiovascular: Negative for chest pain and palpitations.  Gastrointestinal: Negative for abdominal pain, constipation, diarrhea, nausea and vomiting.  Musculoskeletal: Negative for joint pain and myalgias.  Neurological: Negative for dizziness and headaches.  Psychiatric/Behavioral: Negative for depression, hallucinations, memory loss and suicidal ideas. The patient is nervous/anxious and has insomnia.        Negative for HI.   All other systems reviewed and are negative.    Vitals: Blood pressure 122/76, pulse 71, temperature (!) 97.4 F (36.3 C), temperature source Oral, resp. rate 16, SpO2 99 %. There is no height or weight on file to calculate BMI.  Musculoskeletal: Strength & Muscle Tone: within normal limits Gait & Station: normal Patient leans: N/A   Recommendations:  Based on my evaluation the patient does not appear to have an emergency medical condition.  Patient is a 30 year old male with history of paranoid schizophrenia. Patient denies SI, HI, AVH, or delusions and is not psychotic on exam.  Patient appears to be at his baseline in terms of his paranoia based on his current presentation in comparison to past assessments. Patient is not an imminent danger/threat to himself or others. Patient does not meet inpatient psychiatric treatment criteria or [facility] behavioral health urgent care continuous observation/assessment criteria. Patient refused for his ACTT team to be contacted. Recommend that  patient contact his ACTT team in order to follow-up with his ACTT team therapist and psychiatrist.  Attempted to provide resource sheet to the patient containing [name] contact phone numbers for the patient's ACTT Team Office, Crisis, Therapist, and Team Lead, as well as information regarding [employer] open access/walk-in Monday through Friday schedule for medication management and therapy.  Patient refused this resource sheet and stated that he did not want it.  Safety planning done at length with the patient regarding appropriate actions to take/resources to utilize [facility], 911, nearest ED, suicide prevention Lifeline) if the patient becomes suicidal or homicidal, if the patient's condition rapidly deteriorates/worsens/does not improve, or if the patient begins to experience a mental health crisis.  Patient can contract for his safety.  Patient verbalizes understanding and agreement of the overall plan, including safety plan. Patient discharged to the street/his destination of choice.   Jaclyn Shaggy, PA-C 02/22/2021, 3:37 AM

## 2021-02-22 NOTE — ED Provider Notes (Signed)
[facility]-EMERGENCY DEPT Provider Note   CSN: [id number] Arrival date & time: 02/21/21  2238     History Chief Complaint  Patient presents with  . Shortness of Breath    [name] is a 30 y.o. male.  Patient apparently came in initially for shortness of breath had a breathing through with EMS and is asymptomatic on arrival here.  Very familiar with the patient and he is often here with nonspecific complaints and effort sleep as long as possible.  Patient ultimately stated he did not feel like he needed anything for me today and requested discharge.        Past Medical History:  Diagnosis Date  . Depression   . Schizophrenia [facility])     Patient Active Problem List   Diagnosis Date Noted  . 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 Schizophrenia, paranoid type (HCC) 10/24/2020  . Paranoid schizophrenia (HCC) 01/05/2020  . Other schizophrenia (HCC) 02/14/2018  . Cannabis use disorder, moderate, dependence (HCC) 05/10/2016    Past Surgical History:  Procedure Laterality Date  . BACK SURGERY         History reviewed. No pertinent family history.  Social History   Tobacco Use  . Smoking status: Current Some Day Smoker    Types: Cigarettes  . Smokeless tobacco: Never Used  Vaping Use  . Vaping Use: Never used  Substance Use Topics  . Alcohol use: No  . Drug use: Yes    Types: Marijuana    Comment: rare    Home Medications Prior to Admission medications   Medication Sig Start Date End Date Taking? Authorizing Provider  paliperidone (INVEGA) 6 MG 24 hr tablet Take 1 tablet (6 mg total) by mouth daily for 14 days. Patient not taking: No sig reported 01/02/21 02/22/21  Mare Ferrari, PA-C    Allergies    Shellfish allergy and Bee pollen  Review of Systems   Review of Systems  All other systems reviewed and are negative.   Physical Exam Updated Vital Signs BP 118/87   Pulse 87   Temp 98 F (36.7 C) (Oral)   Resp 18    SpO2 95%   Physical Exam Vitals and nursing note reviewed.  Constitutional:      Appearance: He is well-developed.  HENT:     Head: Normocephalic and atraumatic.  Cardiovascular:     Rate and Rhythm: Normal rate.  Pulmonary:     Effort: Pulmonary effort is normal. No respiratory distress.     Breath sounds: No decreased breath sounds or wheezing.  Chest:     Chest wall: No mass or tenderness.  Abdominal:     General: There is no distension.     Palpations: Abdomen is soft.  Musculoskeletal:        General: Normal range of motion.     Cervical back: Normal range of motion.  Skin:    General: Skin is warm and dry.  Neurological:     General: No focal deficit present.     Mental Status: He is alert.     ED Results / Procedures / Treatments   Labs (all labs ordered are listed, but only abnormal results are displayed) Labs Reviewed - No data to display  EKG None  Radiology No results found.  Procedures Procedures   Medications Ordered in ED Medications - No data to display  ED Course  I have reviewed the triage vital signs and the nursing notes.  Pertinent labs & imaging results  that were available during my care of the patient were reviewed by me and considered in my medical decision making (see chart for details).    MDM Rules/Calculators/A&P                          No obvious medical emergency present based on history and physical  Final Clinical Impression(s) / ED Diagnoses Final diagnoses:  Dyspnea, unspecified type    Rx / DC Orders ED Discharge Orders    None       Matai Carpenito, Barbara Cower, MD 02/22/21 0300

## 2021-02-23 ENCOUNTER — Emergency Department (HOSPITAL_COMMUNITY)
Admission: EM | Admit: 2021-02-23 | Discharge: 2021-02-23 | Disposition: A | Discharge status: Home / Self Care | Payer: Medicaid - Out of State | Attending: Emergency Medicine | Admitting: Emergency Medicine

## 2021-02-23 ENCOUNTER — Encounter (HOSPITAL_COMMUNITY): Payer: Self-pay

## 2021-02-23 DIAGNOSIS — R062 Wheezing: Secondary | ICD-10-CM | POA: Insufficient documentation

## 2021-02-23 DIAGNOSIS — F17210 Nicotine dependence, cigarettes, uncomplicated: Secondary | ICD-10-CM | POA: Diagnosis not present

## 2021-02-23 DIAGNOSIS — R0602 Shortness of breath: Secondary | ICD-10-CM | POA: Diagnosis not present

## 2021-02-23 MED ORDER — ALBUTEROL SULFATE HFA 108 (90 BASE) MCG/ACT IN AERS
4.0000 | INHALATION_SPRAY | Freq: Once | RESPIRATORY_TRACT | Status: AC
  Administered 2021-02-23: 4 via RESPIRATORY_TRACT
  Filled 2021-02-23: qty 6.7

## 2021-02-23 MED ORDER — METHYLPREDNISOLONE SODIUM SUCC 125 MG IJ SOLR: 125.0000 mg | Freq: Once | INTRAMUSCULAR | Status: DC

## 2021-02-23 MED ORDER — AEROCHAMBER Z-STAT PLUS/MEDIUM MISC
1.0000 | Freq: Once | Status: DC
  Filled 2021-02-23: qty 1

## 2021-02-23 MED ORDER — METHYLPREDNISOLONE SODIUM SUCC 125 MG IJ SOLR
125.0000 mg | Freq: Once | INTRAMUSCULAR | Status: DC
  Filled 2021-02-23: qty 2

## 2021-02-23 NOTE — ED Triage Notes (Signed)
Pt presents via [facility], c/o SOB. Seen here yesterday for same. Pt is audibly wheezing during triage. Denies fevers or cough

## 2021-02-23 NOTE — ED Notes (Signed)
Ambulated to RR without distress.

## 2021-02-23 NOTE — ED Provider Notes (Signed)
[facility]-EMERGENCY DEPT Provider Note   CSN: [id number] Arrival date & time: 02/23/21  9528     History Chief Complaint  Patient presents with  . Shortness of Breath    [name] is a 30 y.o. male with history of depression, schizophrenia, substance use.  Presents with chief plaint of shortness of breath.  Reports shortness of breath over the last few days.  Patient endorses wheezing.  Patient is unsure if he has been previously diagnosed with asthma.  Patient denies any fevers, chills, nasal congestion, rhinorrhea, sneezing, cough, chest pain.  Patient denies any history of PE or DVT, interval leg swelling or tenderness, hemoptysis, surgery or traumatic injury in the last 12 weeks, hormone use, history of cancer, prolonged immobilization.  HPI     Past Medical History:  Diagnosis Date  . Depression   . Schizophrenia [facility])     Patient Active Problem List   Diagnosis Date Noted  . 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 Schizophrenia, paranoid type (HCC) 10/24/2020  . Paranoid schizophrenia (HCC) 01/05/2020  . Other schizophrenia (HCC) 02/14/2018  . Cannabis use disorder, moderate, dependence (HCC) 05/10/2016    Past Surgical History:  Procedure Laterality Date  . BACK SURGERY         History reviewed. No pertinent family history.  Social History   Tobacco Use  . Smoking status: Current Some Day Smoker    Types: Cigarettes  . Smokeless tobacco: Never Used  Vaping Use  . Vaping Use: Never used  Substance Use Topics  . Alcohol use: No  . Drug use: Yes    Types: Marijuana    Comment: rare    Home Medications Prior to Admission medications   Medication Sig Start Date End Date Taking? Authorizing Provider  paliperidone (INVEGA) 6 MG 24 hr tablet Take 1 tablet (6 mg total) by mouth daily for 14 days. Patient not taking: No sig reported 01/02/21 02/22/21  Mare Ferrari, PA-C    Allergies    Shellfish allergy and Bee  pollen  Review of Systems   Review of Systems  Constitutional: Negative for chills and fever.  HENT: Negative for congestion, rhinorrhea, sore throat and trouble swallowing.   Respiratory: Positive for shortness of breath. Negative for cough.   Cardiovascular: Negative for chest pain and palpitations.  Skin: Negative for color change and pallor.  Neurological: Negative for dizziness, syncope and light-headedness.    Physical Exam Updated Vital Signs BP (!) 148/102 (BP Location: Right Arm)   Pulse 86   Temp 98.2 F (36.8 C) (Oral)   Resp 19   SpO2 100%   Physical Exam Vitals and nursing note reviewed.  Constitutional:      General: He is not in acute distress.    Appearance: He is not ill-appearing, toxic-appearing or diaphoretic.  HENT:     Head: Normocephalic.  Eyes:     General: No scleral icterus.       Right eye: No discharge.        Left eye: No discharge.  Cardiovascular:     Rate and Rhythm: Normal rate.  Pulmonary:     Effort: Pulmonary effort is normal. No tachypnea, bradypnea or respiratory distress.     Breath sounds: Examination of the right-upper field reveals wheezing. Examination of the left-upper field reveals wheezing. Examination of the right-middle field reveals wheezing. Examination of the left-middle field reveals wheezing. Examination of the right-lower field reveals wheezing. Examination of the left-lower field reveals wheezing. Wheezing present.  Comments: Inspiratory and expiratory wheezing noted to all lung fields  Patient able speak in full complete sentences without difficulty Musculoskeletal:     Right lower leg: No tenderness. No edema.     Left lower leg: No tenderness. No edema.  Skin:    General: Skin is warm and dry.     Coloration: Skin is not cyanotic or pale.  Neurological:     General: No focal deficit present.     Mental Status: He is alert.  Psychiatric:        Behavior: Behavior is cooperative.     ED Results /  Procedures / Treatments   Labs (all labs ordered are listed, but only abnormal results are displayed) Labs Reviewed - No data to display  EKG None  Radiology No results found.  Procedures Procedures   Medications Ordered in ED Medications  albuterol (VENTOLIN HFA) 108 (90 Base) MCG/ACT inhaler 4 puff (has no administration in time range)  AeroChamber Plus Flo-Vu Medium MISC 1 each (has no administration in time range)  methylPREDNISolone sodium succinate (SOLU-MEDROL) 125 mg/2 mL injection 125 mg (has no administration in time range)    ED Course  I have reviewed the triage vital signs and the nursing notes.  Pertinent labs & imaging results that were available during my care of the patient were reviewed by me and considered in my medical decision making (see chart for details).    MDM Rules/Calculators/A&P                          Alert 30 year old male no acute stress, nontoxic-appearing.  Patient presents with complaint of shortness of breath and wheezing.  Patient unsure if he has had a history of asthma.  Patient denies any fevers, chills, URI symptoms, unilateral leg swelling or tenderness, chest pain, syncope.  Patient able speak in full complete sentences without difficulty.  Oxygen saturation 100% on room air.  Patient has inspiratory and expiratory wheezing to all lung fields.  No swelling or tenderness to bilateral lower extremities.  Low suspicion for PE as patient can be ruled out via [facility] criteria.  Will give patient albuterol inhaler as well as Solu-Medrol.  Will reassess.  Patient refused Solu-Medrol but to take albuterol inhaler.  On repeat examination patient's lungs clear to auscultation in all fields.  Patient able to speak in full complete sentences; in no acute distress.  Will discharge patient with albuterol inhaler and follow-up to [facility] health and wellness center.  Patient given ductions on proper albuterol inhaler use.  Stressed importance of  following up with  and wellness center.  Patient given strict return precautions.  Patient expressed understanding of all instructions and is agreeable with this plan.  Final Clinical Impression(s) / ED Diagnoses Final diagnoses:  Shortness of breath    Rx / DC Orders ED Discharge Orders    None       Berneice Heinrich 02/23/21 1943    Linwood Dibbles, MD 02/24/21 [phone]

## 2021-02-23 NOTE — ED Notes (Signed)
PA at bedside.

## 2021-02-23 NOTE — Discharge Instructions (Addendum)
You came to the emergency department today to be evaluated for your shortness of breath.  You had wheezing on your physical exam.  Your symptoms are likely due to an asthma exacerbation.  After receiving treatment here wheezing improved.  I am sending you home with an albuterol inhaler.  You may use 1 to 2 puffs every 4-6 hours as needed for shortness of breath and wheezing.  Please follow-up with the [place] and wellness community clinic for further management of your symptoms.  Get help right away if: Your shortness of breath gets worse. You have shortness of breath when you are resting. You feel light-headed or you faint. You have a cough that is not controlled with medicines. You cough up blood. You have pain with breathing. You have pain in your chest, arms, shoulders, or abdomen. You have a fever. You cannot walk up stairs or exercise the way that you normally do.

## 2021-02-23 NOTE — ED Notes (Signed)
Pt refused IV solumedrol, stated he was scared. Explained that this medication was a steroid and it was safe, pt still refused.

## 2021-02-24 ENCOUNTER — Ambulatory Visit (HOSPITAL_COMMUNITY)
Admission: EM | Admit: 2021-02-24 | Discharge: 2021-02-24 | Disposition: A | Discharge status: Home / Self Care | Payer: Medicaid Other | Attending: Licensed Clinical Social Worker | Admitting: Licensed Clinical Social Worker

## 2021-02-24 ENCOUNTER — Other Ambulatory Visit: Payer: Self-pay

## 2021-02-24 DIAGNOSIS — F200 Paranoid schizophrenia: Secondary | ICD-10-CM | POA: Insufficient documentation

## 2021-02-24 NOTE — BH Assessment (Signed)
Comprehensive Clinical Assessment (CCA) Note  02/24/2021 [name] [id number]  Chief Complaint: Paranoia  Visit Diagnosis:  Schizophrenia  Disposition: Per [name], NP pt does not meet inpatient criteria and can be discharged to follow up with outpatient psychiatric providers.   Flowsheet Row ED from 02/24/2021 in [facility] ED from 02/23/2021 in [facility] Pre[facility]admit from [facility]  C-SSRS RISK CATEGORY No Risk No Risk No Risk     The patient demonstrates the following risk factors for suicide: Chronic risk factors for suicide include: psychiatric disorder of schizophrenia. Acute risk factors for suicide include: family or marital conflict, social withdrawal/isolation and loss (financial, interpersonal, professional). Protective factors for this patient include: n/a. Considering these factors, the overall suicide risk at this point appears to be low. Patient is appropriate for outpatient follow up.   Pt is a 30 yo adult transported to [facility] via [facility]. Pt has a significant behavioral health history and is unable to articulate events prior to [facility] transport tonight. Pt reports current medication noncompliance .Pt denies SI, HI, or AVH. Pt denies paranoid ideation.  Pt states current stressors include worries about his life (physical health).    Pts current living situation is homeless with limited supports.  Pt has poor?insight and judgment?.Pt denies any current legal history   Pt's behavioral health history includes multiple ED and [facility] visits . IP history includes . Last inpatient admission was 02/22/2020.  Pt reports/denies alcohol/ substance abuse.   Weyman Pedro, MSW, LCSW Outpatient Therapist/Triage Specialist  CCA Screening, Triage and Referral (STR)  Patient Reported Information How did you hear about [place]? Legal System  Referral name:  [employer]/self  Referral phone number: 0 (N/A)   Whom do you see for routine medical problems? I don't have a doctor  Practice/Facility Name: No data recorded Practice/Facility Phone Number: No data recorded Name of Contact: No data recorded Contact Number: No data recorded Contact Fax Number: No data recorded Prescriber Name: No data recorded Prescriber Address (if known): No data recorded  What Is the Reason for Your Visit/Call Today? Pt transported to [facility] via [employer]. Pt states he "fears for my life". Pt denies SI, HI, AVH. Pt denies paranoia yet states repeatedly that he is scared for his life. Pt states that he has been noncompliant with his medication "I don't need meds". Pt is sweating heavily.  How Long Has This Been Causing You Problems? <Week  What Do You Feel Would Help You the Most Today? Treatment for Depression or other mood problem   Have You Recently Been in Any Inpatient Treatment (Hospital/Detox/Crisis Center/28-Day Program)? No  Name/Location of Program/Hospital:[facility]  How Long Were You There? Per chart, "2 days."  When Were You Discharged? 02/24/2020 (Per chart.)   Have You Ever Received Services From [facility] Before? Yes  Who Do You See at [facility]? Pt has previous ED, [facility] and [facility] visits.   Have You Recently Had Any Thoughts About Hurting Yourself? No  Are You Planning to Commit Suicide/Harm Yourself At This time? No   Have you Recently Had Thoughts About Hurting Someone [name]? No  Explanation: No data recorded  Have You Used Any Alcohol or Drugs in the Past 24 Hours? No  How Long Ago Did You Use Drugs or Alcohol? 0000 (Unclear)  What Did You Use and How Much? pt states that he is not using any substances   Do You Currently Have a Therapist/Psychiatrist? No  Name of Therapist/Psychiatrist: Pt denies any ACTT team services   Have You Been Recently Discharged From Any Office Practice or Programs? No  Explanation of Discharge From  Practice/Program: No data recorded    CCA Screening Triage Referral Assessment Type of Contact: Face-to-Face  Is this Initial or Reassessment? Initial Assessment  Date Telepsych consult ordered in [facility]:  02/24/2021  Time Telepsych consult ordered in [facility]:  2014   Patient Reported Information Reviewed? Yes  Patient Left Without Being Seen? No  Reason for Not Completing Assessment: No data recorded  Collateral Involvement: Pt states he has nobody; doesn't talk with family, and is currently homeless   Does Patient Have a Automotive engineer Guardian? No data recorded Name and Contact of Legal Guardian: [name], legal guardian.   If Minor and Not Living with Parent(s), Who has Custody? N/A  Is CPS involved or ever been involved? Never  Is APS involved or ever been involved? In the past   Patient Determined To Be At Risk for Harm To Self or Others Based on Review of Patient Reported Information or Presenting Complaint? No  Method: No Plan  Availability of Means: No access or NA  Intent: Vague intent or NA  Notification Required: No need or identified person  Additional Information for Danger to Others Potential: Active psychosis  Additional Comments for Danger to Others Potential: History of children playing outside triggering patient.  Are There Guns or Other Weapons in Your Home? No  Types of Guns/Weapons: No data recorded Are These Weapons Safely Secured?                            No data recorded Who Could Verify You Are Able To Have These Secured: No data recorded Do You Have any Outstanding Charges, Pending Court Dates, Parole/Probation? None noted  Contacted To Inform of Risk of Harm To Self or Others: Unable to Contact:   Location of Assessment: [name]   Does Patient Present under Involuntary Commitment? No  IVC Papers Initial File Date: 10/22/2020   [place] of Residence: [place]   Patient Currently Receiving the Following  Services: Not Receiving Services   Determination of Need: Routine (7 days)   Options For Referral: Outpatient Therapy; Medication Management     CCA Biopsychosocial Intake/Chief Complaint:  Pt reports, he's scared for his life.  Current Symptoms/Problems: Pt is scared for his life.   Patient Reported Schizophrenia/Schizoaffective Diagnosis in Past: Yes   Strengths: Not assessed.  Preferences: Not assessed.  Abilities: Not assessed.   Type of Services Patient Feels are Needed: Pt reports he can contract for safety if discharged.   Initial Clinical Notes/Concerns: Pt very guarded, denied SI, HI, AVH, self-injurious behavior, and substance use concerns   Mental Health Symptoms Depression:  None   Duration of Depressive symptoms: Greater than two weeks   Mania:  Change in energy/activity   Anxiety:   Tension; Worrying   Psychosis:  Affective flattening/alogia/avolition   Duration of Psychotic symptoms: Greater than six months   Trauma:  None   Obsessions:  None   Compulsions:  None   Inattention:  None   Hyperactivity/Impulsivity:  N/A   Oppositional/Defiant Behaviors:  N/A   Emotional Irregularity:  N/A   Other Mood/Personality Symptoms:  paranoia (per earlier report)    Mental Status Exam Appearance and self-care  Stature:  Average   Weight:  Average weight   Clothing:  Disheveled   Grooming:  Neglected   Cosmetic use:  None   Posture/gait:  Slumped   Motor activity:  Not Remarkable   Sensorium  Attention:  Normal   Concentration:  Normal   Orientation:  Person; Place; Situation   Recall/memory:  Defective in Immediate; Defective in Short-term   Affect and Mood  Affect:  Blunted   Mood:  Anxious   Relating  Eye contact:  Fleeting   Facial expression:  Constricted   Attitude toward examiner:  Guarded   Thought and Language  Speech flow: Soft   Thought content:  Suspicious   Preoccupation:  None   Hallucinations:   None   Organization:  No data recorded  [employer] of Knowledge:  Poor   Intelligence:  Needs investigation   Abstraction:  -- (UTA)   Judgement:  Poor   Reality Testing:  Adequate   Insight:  Gaps   Decision Making:  Impulsive; Paralyzed   Social Functioning  Social Maturity:  Irresponsible; Impulsive   Social Judgement:  "Chief of Staff"   Stress  Stressors:  Housing; Office manager Ability:  Overwhelmed   Skill Deficits:  Self-care; Responsibility; Decision making   Supports:  Support needed     Religion: Religion/Spirituality Are You A Religious Person?:  (unable to assess) How Might This Affect Treatment?: NA  Leisure/Recreation: Leisure / Recreation Do You Have Hobbies?:  (unable to assess)  Exercise/Diet: Exercise/Diet Do You Exercise?: Yes Have You Gained or Lost A Significant Amount of Weight in the Past Six Months?: No Do You Have Any Trouble Sleeping?: Yes Explanation of Sleeping Difficulties: Pt reports fatigue   CCA Employment/Education Employment/Work Situation: Employment / Work Situation Employment situation: On disability Why is patient on disability: Mental health How long has patient been on disability: UTA What is the longest time patient has a held a job?: UTA 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 Has your sexual activity been affected by drugs, alcohol, medication, or emotional stress?: Not assessed. Does  patient have children?: No  Childhood History:  Childhood History Additional childhood history information: UTA Description of patient's relationship with caregiver when they were a child: UTA Patient's description of current relationship with people who raised him/her: UTA Does patient have siblings?: No Did patient suffer any verbal/emotional/physical/sexual abuse as a child?: No Did patient suffer from severe childhood neglect?: No Has patient ever been sexually abused/assaulted/raped as an adolescent or adult?: No Was the patient ever a victim of a crime or a disaster?: No Witnessed domestic violence?: No Has patient been affected by domestic violence as an adult?: No  Child/Adolescent Assessment:     CCA Substance Use Alcohol/Drug Use: Alcohol / Drug Use Pain Medications: See MAR Prescriptions: See MAR Over the Counter: See MAR History of alcohol / drug use?: No history of alcohol / drug abuse Longest period of sobriety (when/how long): Unknown   ASAM's:  Six Dimensions of Multidimensional Assessment  Dimension 1:  Acute Intoxication and/or Withdrawal Potential:   Dimension 1:  Description of individual's past and  current experiences of substance use and withdrawal: Pt reports that he use to smoke marijuana.  Dimension 2:  Biomedical Conditions and Complications:      Dimension 3:  Emotional, Behavioral, or Cognitive Conditions and Complications:     Dimension 4:  Readiness to Change:     Dimension 5:  Relapse, Continued use, or Continued Problem Potential:     Dimension 6:  Recovery/Living Environment:     ASAM Severity Score:    ASAM Recommended Level of Treatment:     Substance use Disorder (SUD)  none  Recommendations for Services/Supports/Treatments: Recommendations for Services/Supports/Treatments Recommendations For Services/Supports/Treatments: Medication Management,Individual Therapy  DSM5 Diagnoses: Patient Active Problem List   Diagnosis Date Noted   . 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 Schizophrenia, paranoid type (HCC) 10/24/2020  . Paranoid schizophrenia (HCC) 01/05/2020  . Other schizophrenia (HCC) 02/14/2018  . Cannabis use disorder, moderate, dependence (HCC) 05/10/2016    Referrals to Alternative Service(s): Referred to Alternative Service(s):   Place:   Date:   Time:    Referred to Alternative Service(s):   Place:   Date:   Time:    Referred to Alternative Service(s):   Place:   Date:   Time:    Referred to Alternative Service(s):   Place:   Date:   Time:     Ernest HaberChristina R Rubye Strohmeyer, LCSW

## 2021-02-24 NOTE — Discharge Instructions (Addendum)
[name] team lead [name] Caro: [phone])   Discharge recommendations:  Patient is to take medications as prescribed. Please see information for follow-up appointment with psychiatry and therapy. Please follow up with your primary care provider for all medical related needs.   Therapy: We recommend that patient participate in individual therapy to address mental health concerns.  Medications: The patient is to contact a medical professional and/or outpatient provider to address any new side effects that develop. Patient should update outpatient providers of any new medications and/or medication changes.   Atypical antipsychotics: If you are prescribed an atypical antipsychotic, it is recommended that your height, weight, BMI, blood pressure, fasting lipid panel, and fasting blood sugar be monitored by your outpatient providers.  Safety:  The patient should abstain from use of illicit substances/drugs and abuse of any medications. If symptoms worsen or do not continue to improve or if the patient becomes actively suicidal or homicidal then it is recommended that the patient return to the closest hospital emergency department, the [facility], or call 911 for further evaluation and treatment. National Suicide Prevention Lifeline 1-800-SUICIDE or 743 757 8504.

## 2021-02-24 NOTE — ED Provider Notes (Addendum)
Behavioral Health Urgent Care Medical Screening Exam  Patient Name: [name] MRN: [id number] Date of Evaluation: 02/24/21 Chief Complaint: Chief Complaint/Presenting Problem: Pt reports, he's scared for his life. Diagnosis:  Final diagnoses:  Schizophrenia, paranoid (HCC)    History of Present illness: [name] is a 30 y.o. male with a history of schizophrenia who is well know to behavioral health and area emergency departments. Patient reports that he is here because he is "scared." Patient will not elaborate. Repeats "I don't know, I'm just scared." He is alert and oriented x 4. Pleasant and cooperative. Speech is clear and coherent. Denies suicidal ideations. Denies homicidal ideations. Denies auditory and visual hallucinations. No indication that he is responding to internal stimuli. He denies recent use of marijuana, alcohol, and other substances. He appears to be at baseline. Patient has payee but not a guardian. Declines collateral contact.   Psychiatric Specialty Exam  Presentation  General Appearance:Appropriate for Environment; Casual; Neat  Eye Contact:Good  Speech:Clear and Coherent; Normal Rate  Speech Volume:Normal  Handedness:Right   Mood and Affect  Mood:Anxious  Affect:Blunt   Thought Process  Thought Processes:Coherent  Descriptions of Associations:Intact  Orientation:Full (Time, Place and Person)  Thought Content:Paranoid Ideation  Diagnosis of Schizophrenia or Schizoaffective disorder in past: Yes  Duration of Psychotic Symptoms: Greater than six months  Hallucinations:None Patient endorses auditory hallucination but is unable to elaborate. He states "I don't know"  Ideas of Reference:Paranoia  Suicidal Thoughts:No  Homicidal Thoughts:No   Sensorium  Memory:Immediate Fair; Recent Fair; Remote Fair  Judgment:Intact  Insight:Poor   Executive Functions  Concentration:Fair  Attention Span:Fair  Recall:Fair  Fund of  Knowledge:Fair  Language:Fair   Psychomotor Activity  Psychomotor Activity:Normal   Assets  Assets:Communication Skills; Financial Resources/Insurance; Desire for Improvement; Housing; Physical Health; Social Support   Sleep  Sleep:Fair  Number of hours: 2   No data recorded  Physical Exam: Physical Exam Constitutional:      General: He is not in acute distress.    Appearance: He is not ill-appearing, toxic-appearing or diaphoretic.  HENT:     Head: Normocephalic.     Right Ear: External ear normal.     Left Ear: External ear normal.  Eyes:     Pupils: Pupils are equal, round, and reactive to light.  Cardiovascular:     Rate and Rhythm: Normal rate.  Pulmonary:     Effort: Pulmonary effort is normal. No respiratory distress.  Musculoskeletal:        General: Normal range of motion.  Skin:    General: Skin is warm and dry.  Neurological:     Mental Status: He is alert and oriented to person, place, and time.  Psychiatric:        Mood and Affect: Mood is depressed.        Speech: Speech normal.        Behavior: Behavior is cooperative.        Thought Content: Thought content is not paranoid or delusional. Thought content includes suicidal ideation. Thought content does not include homicidal ideation. Thought content does not include suicidal plan.    Review of Systems  Constitutional: Negative for chills, diaphoresis, fever, malaise/fatigue and weight loss.  HENT: Negative for congestion.   Respiratory: Negative for cough and shortness of breath.   Cardiovascular: Negative for chest pain and palpitations.  Gastrointestinal: Negative for diarrhea, nausea and vomiting.  Neurological: Negative for dizziness and seizures.  Psychiatric/Behavioral: Negative for depression, hallucinations, memory loss, substance abuse and suicidal  ideas. The patient is nervous/anxious and has insomnia.   All other systems reviewed and are negative.  Blood pressure 126/82, pulse 70,  temperature 98.9 F (37.2 C), resp. rate (!) 98, SpO2 99 %. There is no height or weight on file to calculate BMI.  Musculoskeletal: Strength & Muscle Tone: within normal limits Gait & Station: normal Patient leans: N/A   [facility] MSE Discharge Disposition for Follow up and Recommendations: Based on my evaluation the patient does not appear to have an emergency medical condition and can be discharged with resources and follow up care in outpatient services for Medication Management and Individual Therapy   Recommend follow up with ACT Team. Provided with ACT Team contact information.    Jackelyn Poling, NP 02/24/2021, 9:01 PM

## 2021-02-25 ENCOUNTER — Ambulatory Visit (HOSPITAL_COMMUNITY)
Admission: EM | Admit: 2021-02-25 | Discharge: 2021-02-25 | Disposition: A | Discharge status: Home / Self Care | Payer: Medicaid Other | Attending: Psychiatry | Admitting: Psychiatry

## 2021-02-25 ENCOUNTER — Other Ambulatory Visit: Payer: Self-pay

## 2021-02-25 ENCOUNTER — Emergency Department (HOSPITAL_COMMUNITY)
Admission: EM | Admit: 2021-02-25 | Discharge: 2021-02-26 | Disposition: A | Discharge status: Home / Self Care | Payer: Medicaid - Out of State | Source: Home / Self Care | Attending: Emergency Medicine | Admitting: Emergency Medicine

## 2021-02-25 ENCOUNTER — Emergency Department (HOSPITAL_COMMUNITY)
Admission: EM | Admit: 2021-02-25 | Discharge: 2021-02-25 | Disposition: A | Discharge status: Home / Self Care | Payer: Medicaid - Out of State | Attending: Emergency Medicine | Admitting: Emergency Medicine

## 2021-02-25 ENCOUNTER — Encounter (HOSPITAL_COMMUNITY): Payer: Self-pay | Admitting: Emergency Medicine

## 2021-02-25 DIAGNOSIS — F200 Paranoid schizophrenia: Secondary | ICD-10-CM | POA: Insufficient documentation

## 2021-02-25 DIAGNOSIS — F17210 Nicotine dependence, cigarettes, uncomplicated: Secondary | ICD-10-CM | POA: Insufficient documentation

## 2021-02-25 DIAGNOSIS — Z008 Encounter for other general examination: Secondary | ICD-10-CM | POA: Insufficient documentation

## 2021-02-25 DIAGNOSIS — Z560 Unemployment, unspecified: Secondary | ICD-10-CM | POA: Diagnosis not present

## 2021-02-25 MED ORDER — PALIPERIDONE PALMITATE ER 156 MG/ML IM SUSY
156.0000 mg | PREFILLED_SYRINGE | Freq: Once | INTRAMUSCULAR | Status: DC
  Filled 2021-02-25: qty 1

## 2021-02-25 NOTE — ED Notes (Signed)
Pt states that he does not want the invega injections. Reports it does not do anything for him.

## 2021-02-25 NOTE — Discharge Instructions (Addendum)
Follow up with [facility] ACT team

## 2021-02-25 NOTE — Discharge Instructions (Addendum)
You were evaluated in the Emergency Department and after careful evaluation, we did not find any emergent condition requiring admission or further testing in the hospital.  Your exam/testing today was overall reassuring.  Please return to the Emergency Department if you experience any worsening of your condition.  Thank you for allowing us to be a part of your care.  

## 2021-02-25 NOTE — ED Triage Notes (Signed)
Pt BIB [facility] from a [facility] food [facility], initially pt c/o shortness of breath, LS clear. Then pt stated he is out of his psych meds and is having hallucinations and feeling paranoid.

## 2021-02-25 NOTE — Progress Notes (Signed)
Pt discharged and is waiting for his ACT team to pick him up. Pt refused his AVS.

## 2021-02-25 NOTE — ED Provider Notes (Signed)
[name] EMERGENCY DEPARTMENT Provider Note   CSN: [id number] Arrival date & time: 02/25/21  1939     History Chief Complaint  Patient presents with  . Hallucinations    [name] is a 30 y.o. male.  HPI      30 year old male with history of paranoid schizophrenia, multiple presentations to the emergency department recently, as well as 2 visits to [facility] and a visit to [facility] in the last 24 hours, who presents with concern for anxiety and needing medication.  Reports that he needs his medication.  When asked what medication he needs, he reports Xanax.  Reports "I need a chill pill."    Denies SI, HI, hallucinations.    Denies any medical concerns on my history, just reports he is here to get his medication.   Refusing injections earlier today and yesterday (offered trenza as well) Therapist picked him up, dropped him off, has apt in [facility] that is fully furnished but refused to sleep there because of his paranoia Last received invega 4/10    Past Medical History:  Diagnosis Date  . Depression   . Schizophrenia [facility])     Patient Active Problem List   Diagnosis Date Noted  . 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 Schizophrenia, paranoid type (HCC) 10/24/2020  . Paranoid schizophrenia (HCC) 01/05/2020  . Other schizophrenia (HCC) 02/14/2018  . Cannabis use disorder, moderate, dependence (HCC) 05/10/2016    Past Surgical History:  Procedure Laterality Date  . BACK SURGERY         No family history on file.  Social History   Tobacco Use  . Smoking status: Current Some Day Smoker    Types: Cigarettes  . Smokeless tobacco: Never Used  Vaping Use  . Vaping Use: Never used  Substance Use Topics  . Alcohol use: No  . Drug use: Yes    Types: Marijuana    Comment: rare    Home Medications Prior to Admission medications   Medication Sig Start Date End Date Taking? Authorizing Provider  paliperidone (INVEGA) 6 MG 24  hr tablet Take 1 tablet (6 mg total) by mouth daily for 14 days. Patient not taking: No sig reported 01/02/21 02/22/21  Mare Ferrari, PA-C    Allergies    Shellfish allergy and Bee pollen  Review of Systems   Review of Systems  Constitutional: Negative for fever.  Respiratory: Negative for shortness of breath.   Gastrointestinal: Negative for nausea and vomiting.  Psychiatric/Behavioral: Negative for confusion, hallucinations, self-injury and suicidal ideas. The patient is nervous/anxious.     Physical Exam Updated Vital Signs BP 122/81 (BP Location: Right Arm)   Pulse 76   Temp 98.5 F (36.9 C) (Oral)   Resp 15   SpO2 96%   Physical Exam Vitals and nursing note reviewed.  Constitutional:      General: He is not in acute distress.    Appearance: Normal appearance. He is not ill-appearing, toxic-appearing or diaphoretic.  HENT:     Head: Normocephalic.  Eyes:     Conjunctiva/sclera: Conjunctivae normal.  Cardiovascular:     Rate and Rhythm: Normal rate and regular rhythm.     Pulses: Normal pulses.  Pulmonary:     Effort: Pulmonary effort is normal. No respiratory distress.  Musculoskeletal:        General: No deformity or signs of injury.     Cervical back: No rigidity.  Skin:    General: Skin is warm and dry.  Coloration: Skin is not jaundiced or pale.  Neurological:     General: No focal deficit present.     Mental Status: He is alert and oriented to person, place, and time.  Psychiatric:        Attention and Perception: Attention normal.        Mood and Affect: Mood is not anxious or elated. Affect is blunt. Affect is not angry.        Speech: Speech normal. He is communicative. Speech is not rapid and pressured, delayed or slurred.        Behavior: Behavior normal. Behavior is not agitated, aggressive or hyperactive.        Thought Content: Thought content normal. Thought content does not include homicidal or suicidal ideation. Thought content does not  include homicidal or suicidal plan.     ED Results / Procedures / Treatments   Labs (all labs ordered are listed, but only abnormal results are displayed) Labs Reviewed - No data to display  EKG None  Radiology No results found.  Procedures Procedures   Medications Ordered in ED Medications  paliperidone (INVEGA SUSTENNA) injection 156 mg (has no administration in time range)    ED Course  I have reviewed the triage vital signs and the nursing notes.  Pertinent labs & imaging results that were available during my care of the patient were reviewed by me and considered in my medical decision making (see chart for details).    MDM Rules/Calculators/A&P                           30 year old male with history of paranoid schizophrenia, multiple presentations to the emergency department recently, as well as 2 visits to [facility] and a visit to [facility] in the last 24 hours, who presents with concern for anxiety and needing medication.  He denies acute medical concerns on my history, and also denies suicidal ideation, homicidal ideation or hallucinations.  He is acting appropriately in the emergency department.  Discussed with patient, [name] his act team leader, and reviewed the note from Dr. Bronwen Betters Psychiatry who evaluated him this morning.  He is again requesting xanax for symptoms, and discussed that I agree with his psychiatry team that medication woul dnot have long-term therapeutic effects. He has responded to invega in the past but has been declining due to concern for arm soreness. Discussed recommendation to receive this in detail but continues to decline invega.  He also declines my offer of other non-benzodiazepine medications for anxiety.  He again has not decompensated and agree with evaluation this AM that he does not meet criteria for inpatient psychiatric admission as he is not suicidal, homicidal or acutely psychotic. Stable for discharge and continued outpatient  management.    Final Clinical Impression(s) / ED Diagnoses Final diagnoses:  Paranoid schizophrenia [facility])    Rx / DC Orders ED Discharge Orders    None       Alvira Monday, MD 02/26/21 0222

## 2021-02-25 NOTE — ED Provider Notes (Signed)
[facility]-EMERGENCY DEPT [facility] Emergency Department Provider Note MRN:  [id number]  Arrival date & time: 02/25/21     Chief Complaint   Psychiatric Evaluation   History of Present Illness   [name] is a 30 y.o. year-old male with a history of depression, schizophrenia presenting to the ED with chief complaint of psychiatric evaluation.  Patient here because he wants medicines to help him with his mind.  Denies any significant changes over the past few days, has had multiple evaluations for mental health and has been provided with outpatient resources.  Denies any pain, no trauma, denies SI, no HI, no AVH.  Review of Systems  A complete 10 system review of systems was obtained and all systems are negative except as noted in the HPI and PMH.   Patient's Health History    Past Medical History:  Diagnosis Date  . Depression   . Schizophrenia [facility])     Past Surgical History:  Procedure Laterality Date  . BACK SURGERY      No family history on file.  Social History   Socioeconomic History  . Marital status: Single    Spouse name: Not on file  . Number of children: Not on file  . Years of education: Not on file  . Highest education level: Not on file  Occupational History  . Occupation: unemployed  Tobacco Use  . Smoking status: Current Some Day Smoker    Types: Cigarettes  . Smokeless tobacco: Never Used  Vaping Use  . Vaping Use: Never used  Substance and Sexual Activity  . Alcohol use: No  . Drug use: Yes    Types: Marijuana    Comment: rare  . Sexual activity: Yes    Birth control/protection: Condom  Other Topics Concern  . Not on file  Social History Narrative   ** Merged History Encounter **       ** Merged History Encounter **       UTA certain topics - tangential thought process, cannot answer clearly. Pt not sure about school; reportedly is homeless.    Social Determinants of Health   Financial Resource Strain: Not on file  Food  Insecurity: Not on file  Transportation Needs: Not on file  Physical Activity: Not on file  Stress: Not on file  Social Connections: Not on file  Intimate Partner Violence: Not on file     Physical Exam   Vitals:   02/25/21 0213 02/25/21 0355  BP: 137/89 135/80  Pulse: 74 80  Resp: 16 20  Temp: 97.8 F (36.6 C) 98 F (36.7 C)  SpO2: 97% 99%    CONSTITUTIONAL: Well-appearing, NAD NEURO:  Alert and oriented x 3, no focal deficits EYES:  eyes equal and reactive ENT/NECK:  no LAD, no JVD CARDIO: Regular rate, well-perfused, normal S1 and S2 PULM:  CTAB no wheezing or rhonchi GI/GU:  normal bowel sounds, non-distended, non-tender MSK/SPINE:  No gross deformities, no edema SKIN:  no rash, atraumatic PSYCH:  Appropriate speech and behavior  *Additional and/or pertinent findings included in MDM below  Diagnostic and Interventional Summary    EKG Interpretation  Date/Time:    Ventricular Rate:    PR Interval:    QRS Duration:   QT Interval:    QTC Calculation:   R Axis:     Text Interpretation:        Labs Reviewed - No data to display  No orders to display    Medications - No data to display  Procedures  /  Critical Care Procedures  ED Course and Medical Decision Making  I have reviewed the triage vital signs, the nursing notes, and pertinent available records from the EMR.  Listed above are laboratory and imaging tests that I personally ordered, reviewed, and interpreted and then considered in my medical decision making (see below for details).  Given the recent repeated evaluations by the mental health team and the lack of changes over the past few days, I see no indication for repeat assessment.  Patient has been given all necessary outpatient follow-up and I explained this to him.  We will allow him to stay in the emergency department for a few hours and then direct him to outpatient facilities in the morning.       Elmer Sow. Pilar Plate, MD [facility]  Emergency Medicine [facility] [email or web address]  Final Clinical Impressions(s) / ED Diagnoses     ICD-10-CM   1. Encounter for psychological evaluation  Z00.8     ED Discharge Orders    None       Discharge Instructions Discussed with and Provided to Patient:     Discharge Instructions     You were evaluated in the Emergency Department and after careful evaluation, we did not find any emergent condition requiring admission or further testing in the hospital.  Your exam/testing today was overall reassuring.  Please return to the Emergency Department if you experience any worsening of your condition.  Thank you for allowing [place] to be a part of your care.        Sabas Sous, MD 02/25/21 [phone]

## 2021-02-25 NOTE — ED Provider Notes (Signed)
Behavioral Health Urgent Care Medical Screening Exam  Patient Name: [name] MRN: [id number] Date of Evaluation: 02/25/21 Chief Complaint:   Diagnosis:  Final diagnoses:  Paranoid schizophrenia (HCC)    History of Present illness: [name] is a 30 y.o. male with a history of paranoid schizophrenia. On chart review, patient has had multiple presentations to the ED recently, has been at the [facility] and [facility] ED within the last 24 hours. Pt denies SI/HI/AVH. Pt reports feeling "scared" and states "I need some medicine, like a xanax, so I can relax". Prior to evaluating patient, I spoke to ACT team (see below for details) who reported that he had been refusing his invega for the month of May, he refused it the week of May 8th which is when it was due. He most recently refused it yesterday.  Pt initially denied having an ACT team but later admitted to it. Discussed with patient that it is recommended that he receive his LAI, patient declined and states "I don't want it no more" and states he does not want it due to pain at injection site. Pt states that he feels scared but is unable to discuss further and per ACT team and chart review he does have paranoia at his baseline. He states that his mind is racing. Objectively, there is no evidence of psychosis/ mania (able to converse coherently, linear and goal directed thought, no RIS, no distractibility, not pre-occupied, no FOI, etc). Discussed with patient at length the benefits of receiving LAI including but not limited to, that he would feel safer, his mind would stop racing and it could help to keep him out of the ED as well as inpatient psychiatric hospital. Pt continued to decline and states "I came to this place for help but I'm alright now" and requests discharge. Informed patient that his ACT team would come pick him up. Pt verbalized understanding and requested a snack\     [facility] ACT team; [phone]- Wendelyn Breslow, ACT team lead.  -he was  supposed to be seen today; he has refused his invega. They were going to change him over to the 90 day LAI (invega trinza). They said that they have been trying to see him. He is poorly engaged, he has a furnished apartment since February but there has been some concern about his aunt being his payee and taking his checks. CPS report made by [place] and non privy to the outcome. ACT team made one before and "nothing to investigate". She states that she  bought a set of alarms for his house out of her own pocket for his doors and windows and  and staff installed them with [facility] but he continues to be paranoid. This is his baseline. A couple weeks after injection was starting to show insight and reviewed treatment plan and signed consent. States that if he is willing to take LAI, ACT RN will come pick him up and if he refuses then ACT team therapist will come pick him up.  After speaking with [name], called [name] back without answer and LVM informing of patient's refusal and that he can be picked up as he does not currently meet criteria for inpatient hospitalization and he is cleared for discharge.        Psychiatric Specialty Exam  Presentation  General Appearance:Appropriate for Environment; Casual; Neat  Eye Contact:Good  Speech:Clear and Coherent; Normal Rate  Speech Volume:Normal  Handedness:Right   Mood and Affect  Mood:Euthymic  Affect:Appropriate; Congruent; Blunt  Thought Process  Thought Processes:Coherent  Descriptions of Associations:Intact  Orientation:Full (Time, Place and Person)  Thought Content:Paranoid Ideation  Diagnosis of Schizophrenia or Schizoaffective disorder in past: Yes  Duration of Psychotic Symptoms: Greater than six months  Hallucinations:None Patient endorses auditory hallucination but is unable to elaborate. He states "I don't know"  Ideas of Reference:Paranoia  Suicidal Thoughts:No  Homicidal Thoughts:No   Sensorium   Memory:Immediate Fair; Recent Fair; Remote Fair  Judgment:Intact  Insight:Lacking   Executive Functions  Concentration:Fair  Attention Span:Fair  Recall:Fair  Fund of Knowledge:Fair  Language:Fair   Psychomotor Activity  Psychomotor Activity:Normal   Assets  Assets:Communication Skills; Desire for Improvement; Financial Resources/Insurance; Housing; Physical Health   Sleep  Sleep:Fair     No data recorded  Physical Exam: Physical Exam Constitutional:      Appearance: Normal appearance. He is normal weight.  HENT:     Head: Normocephalic and atraumatic.  Eyes:     Extraocular Movements: Extraocular movements intact.  Pulmonary:     Effort: Pulmonary effort is normal.  Neurological:     General: No focal deficit present.     Mental Status: He is alert and oriented to person, place, and time.  Psychiatric:        Attention and Perception: Attention and perception normal.        Speech: Speech normal.        Behavior: Behavior normal. Behavior is cooperative.    Review of Systems  Constitutional: Negative for chills and fever.  HENT: Negative for hearing loss.   Eyes: Negative for discharge and redness.  Respiratory: Negative for cough.   Cardiovascular: Negative for chest pain.  Gastrointestinal: Negative for abdominal pain.  Musculoskeletal: Negative for myalgias.  Neurological: Negative for headaches.   Blood pressure (!) 120/101, pulse 79, temperature 98 F (36.7 C), temperature source Oral, resp. rate 20, SpO2 96 %. There is no height or weight on file to calculate BMI.  Musculoskeletal: Strength & Muscle Tone: within normal limits Gait & Station: normal Patient leans: N/A   [facility] MSE Discharge Disposition for Follow up and Recommendations: Based on my evaluation the patient does not appear to have an emergency medical condition and can be discharged with ACT team follow up.   Schizophrenia -followed by ACT team although has been refusing  invega sustenna 156 q28 day injection. Last received 01/16/21; he was due 02/13/2021 but has been refusing. He continues to refuse today.   -If patient continues to refuse LAI, he will likely have continued presentations to the ED and at some point will likely decompensate to the point of needing inpatient admission. Pt does not meet criteria at this time as he is not suicidal,  Homicidal or acutely psychotic. He is paranoid to a degree but this is his baseline per ACT team.   -patient to be picked up by member of ACT team  Estella Husk, MD 02/25/2021, 10:49 AM

## 2021-02-25 NOTE — BH Assessment (Signed)
Pt reports "needing a chill pill" to help him focus on reality and needing oxygen from breathing tank. Pt denies taking meds or prior psych hospitalization. Pt is homeless and denies support system. Pt walked from [facility] this morning. Pt denies SI, HI, AVH or drug use.  Pt is routine.

## 2021-02-25 NOTE — ED Notes (Addendum)
Pt denies auditory and visual hallucinations, but does reports that he has been out of his medications "for a while". Pt is unable to provide a list of his medications.

## 2021-02-25 NOTE — ED Triage Notes (Signed)
Pt reports walking to ER stating he feels paranoid and nauseated.  Pt reports his symptoms started today but has a history of psych disorders, but was unable to name them.  Pt attempting to sleep during triage process and refusing to answer several questions.  Pt was able to recall that he is in [facility] but stated he does not know what month, day or year it is and was not able to tell me his name or dob, however pt began to smile and laugh and began to tell this writer his name and dob, but then quickly said never mind I do not know,  I just need Jesus. Pt denies SI/HI.

## 2021-02-26 NOTE — ED Notes (Signed)
Dr. Dalene Seltzer sent a secure chat notifying her that pt refused invega injection.

## 2021-02-28 ENCOUNTER — Ambulatory Visit (HOSPITAL_COMMUNITY)
Admission: EM | Admit: 2021-02-28 | Discharge: 2021-02-28 | Disposition: A | Discharge status: Home / Self Care | Payer: Medicaid Other | Attending: Psychiatry | Admitting: Psychiatry

## 2021-02-28 ENCOUNTER — Other Ambulatory Visit: Payer: Self-pay

## 2021-02-28 ENCOUNTER — Emergency Department (HOSPITAL_COMMUNITY)
Admission: EM | Admit: 2021-02-28 | Discharge: 2021-02-28 | Disposition: A | Discharge status: Home / Self Care | Payer: Medicaid Other | Attending: Emergency Medicine | Admitting: Emergency Medicine

## 2021-02-28 DIAGNOSIS — R41840 Attention and concentration deficit: Secondary | ICD-10-CM | POA: Insufficient documentation

## 2021-02-28 DIAGNOSIS — F17210 Nicotine dependence, cigarettes, uncomplicated: Secondary | ICD-10-CM | POA: Diagnosis not present

## 2021-02-28 DIAGNOSIS — F200 Paranoid schizophrenia: Secondary | ICD-10-CM | POA: Insufficient documentation

## 2021-02-28 MED ORDER — PALIPERIDONE ER 6 MG PO TB24
6.0000 mg | ORAL_TABLET | Freq: Every day | ORAL | Status: DC
  Administered 2021-02-28: 6 mg via ORAL
  Filled 2021-02-28: qty 1

## 2021-02-28 NOTE — ED Triage Notes (Signed)
Pt came in via EMS with c/o mental health evaluation. Pt states that he feels scared for his life. Denies SI, HI. Endorses paranoia. He does not feel like someone is following him but is scared of death

## 2021-02-28 NOTE — ED Provider Notes (Signed)
Behavioral Health Urgent Care Medical Screening Exam  Patient Name: [name] MRN: [id number] Date of Evaluation: 02/28/21 Chief Complaint:  I am scared for my life, someone threatened me Diagnosis:  Final diagnoses:  Paranoid schizophrenia (HCC)    History of Present illness: [name] is a 30 y.o. male presenting in the [facility] via [employer] because the patient reported that he was scared for his life because he is being threatened.  When patient was asked, who was threatening him patient said he did not know. Patient denies having any suicidal ideations auditory or visual hallucinations at this time.  Patient does endorse that he is tired and wants to go to sleep.  Patient has an extensive psychiatric history with multiple presentations to [facility], [facility] and [facility] often multiple visits in the same day ( please see previous chart history for more details. Patient denies having any pain, sob or any other physical health issues that need to be addressed. Patient has received community resources for outpatient services but has not followed up with given recommendations.  Psychiatric Specialty Exam  Presentation  General Appearance:Disheveled  Eye Contact:Poor  Speech:Normal Rate  Speech Volume:Normal  Handedness:Right   Mood and Affect  Mood:Euthymic  Affect:Blunt; Flat   Thought Process  Thought Processes:Disorganized  Descriptions of Associations:Circumstantial  Orientation:Full (Time, Place and Person)  Thought Content:Tangential; Paranoid Ideation  Diagnosis of Schizophrenia or Schizoaffective disorder in past: Yes  Duration of Psychotic Symptoms: Greater than six months  Hallucinations:Auditory Someone is after me  Ideas of Reference:Paranoia  Suicidal Thoughts:No  Homicidal Thoughts:No   Sensorium  Memory:Immediate Fair; Recent Fair  Judgment:Poor  Insight:Lacking   Executive Functions  Concentration:Fair  Attention  Span:Poor  Recall:Poor  Fund of Knowledge:Poor  Language:Fair   Psychomotor Activity  Psychomotor Activity:Normal   Assets  Assets:Desire for Improvement; Financial Resources/Insurance; Physical Health   Sleep  Sleep:Fair  Number of hours: -1   No data recorded  Physical Exam: Physical Exam Vitals reviewed.  HENT:     Head: Atraumatic.     Right Ear: External ear normal.     Left Ear: External ear normal.     Nose: Nose normal.  Cardiovascular:     Rate and Rhythm: Normal rate.     Pulses: Normal pulses.  Pulmonary:     Effort: Pulmonary effort is normal.  Musculoskeletal:        General: Normal range of motion.  Neurological:     General: No focal deficit present.     Mental Status: He is alert.  Psychiatric:        Attention and Perception: He perceives auditory hallucinations.        Speech: Speech normal.        Behavior: Behavior normal.        Thought Content: Thought content is paranoid. Thought content does not include suicidal ideation. Thought content does not include homicidal plan.        Cognition and Memory: Memory normal.        Judgment: Judgment is impulsive.    Review of Systems  Constitutional: Negative.   HENT: Negative.   Eyes: Negative.   Respiratory: Negative.   Cardiovascular: Negative.   Gastrointestinal: Negative.   Genitourinary: Negative.   Musculoskeletal: Negative.   Skin: Negative.   Neurological: Negative.   Psychiatric/Behavioral: Negative for suicidal ideas. The patient is nervous/anxious.    Blood pressure 121/78, pulse 86, temperature (!) 96.9 F (36.1 C), temperature source Tympanic, resp. rate 18, SpO2 98 %.  There is no height or weight on file to calculate BMI.  Musculoskeletal: Strength & Muscle Tone: within normal limits Gait & Station: normal Patient leans: N/A   [facility] MSE Discharge Disposition for Follow up and Recommendations: Based on my evaluation the patient does not appear to have an emergency  medical condition and can be discharged with resources and follow up care in outpatient services for Medication Management and Individual Therapy.   Jasper Riling, NP 02/28/2021, 3:10 AM

## 2021-02-28 NOTE — ED Notes (Signed)
Gave pt bus pass, graham crackers, and peanut butter.

## 2021-02-28 NOTE — ED Notes (Signed)
Pt resting quietly with eyes closed. Direct line of sight of nurses station.

## 2021-02-28 NOTE — Discharge Instructions (Addendum)
Contact your ACT team   Return for suicidal thoughts or plan, homicidal plan, new hallucinations

## 2021-02-28 NOTE — Discharge Instructions (Addendum)

## 2021-02-28 NOTE — ED Provider Notes (Addendum)
[facility]-EMERGENCY DEPT Provider Note   CSN: [id number] Arrival date & time: 02/28/21  0531     History Chief Complaint  Patient presents with  . Mental Health Problem    Kole Hilyard is a 30 y.o. male presents to the ED for "I need a chill pill".  He doesn't know the name of the medicine but something to help him focus.  He does not take any daily medicines. When asked what medicine has helped him in the past he says he doesn't know.  Chart shows patient went to [facility] earlier this morning and was evaluated by provider.  Patient states he doesn't remember this.  Denies SI, HI, AVH.  No other concerns. Asking for food.   HPI     Past Medical History:  Diagnosis Date  . Depression   . Schizophrenia [facility])     Patient Active Problem List   Diagnosis Date Noted  . 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 Schizophrenia, paranoid type (HCC) 10/24/2020  . Paranoid schizophrenia (HCC) 01/05/2020  . Other schizophrenia (HCC) 02/14/2018  . Cannabis use disorder, moderate, dependence (HCC) 05/10/2016    Past Surgical History:  Procedure Laterality Date  . BACK SURGERY         No family history on file.  Social History   Tobacco Use  . Smoking status: Current Some Day Smoker    Types: Cigarettes  . Smokeless tobacco: Never Used  Vaping Use  . Vaping Use: Never used  Substance Use Topics  . Alcohol use: No  . Drug use: Yes    Types: Marijuana    Comment: rare    Home Medications Prior to Admission medications   Medication Sig Start Date End Date Taking? Authorizing Provider  paliperidone (INVEGA) 6 MG 24 hr tablet Take 1 tablet (6 mg total) by mouth daily for 14 days. Patient not taking: No sig reported 01/02/21 02/22/21  Mare Ferrari, PA-C    Allergies    Shellfish allergy and Bee pollen  Review of Systems   Review of Systems  Psychiatric/Behavioral: Positive for decreased concentration.  All other systems reviewed and are  negative.   Physical Exam Updated Vital Signs BP (!) 129/109   Pulse 63   Temp 97.8 F (36.6 C) (Oral)   Resp 18   SpO2 97%   Physical Exam Constitutional:      Appearance: He is well-developed.  HENT:     Head: Normocephalic.     Nose: Nose normal.  Eyes:     General: Lids are normal.  Cardiovascular:     Rate and Rhythm: Normal rate.  Pulmonary:     Effort: Pulmonary effort is normal. No respiratory distress.  Musculoskeletal:        General: Normal range of motion.     Cervical back: Normal range of motion.  Neurological:     Mental Status: He is alert.  Psychiatric:        Behavior: Behavior normal.     ED Results / Procedures / Treatments   Labs (all labs ordered are listed, but only abnormal results are displayed) Labs Reviewed - No data to display  EKG None  Radiology No results found.  Procedures Procedures   Medications Ordered in ED Medications  paliperidone (INVEGA) 24 hr tablet 6 mg (has no administration in time range)    ED Course  I have reviewed the triage vital signs and the nursing notes.  Pertinent labs & imaging results that were available during  my care of the patient were reviewed by me and considered in my medical decision making (see chart for details).    MDM Rules/Calculators/A&P                          30 yo M presents to ER for a "chill pill".  EMR triage and nursing notes reviewed.  Seen at [facility] just prior to arrival here - provider did not think patient required inpatient psych interventions.  Agree. Patient is asleep and asking for a medicine that helps him focus. Denies SI, HI, AVH.  VS normal. Exam otherwise unremarkable.  Does not appear to be responding to internal stimulus.  It does look like patient has been requesting Xanax but will hold off as psych does not think this will help him long term. He has also been refusing his prescribed psych medicines.  Offered invega PO which patient actually accepted. Appropriate  for discharge at this time.   Final Clinical Impression(s) / ED Diagnoses Final diagnoses:  Difficulty concentrating    Rx / DC Orders ED Discharge Orders    None         Liberty Handy, PA-C 02/28/21 [id number]    Derwood Kaplan, MD 03/01/21 1217

## 2021-02-28 NOTE — BH Assessment (Signed)
TRIAGE SCREENING  Pt is a 30 year old single male who presents unaccompanied to [facility] voluntarily via [employer]. Pt has a diagnosis of schizophrenia. He has a history of multiple presentations to [facility] and local EDs and was at [facility] 3 days ago. Pt says he came in because he has problems with life. Pt then states that he wants to leave. Pt denies current suicidal ideation. He denies thoughts of harming others. He denies experiencing auditory or visual hallucinations. Pt says "I'm just drunk." Pt reports he is homeless. He is not requesting any services at this time.  Pt is disheveled, alert and oriented x4. Pt speaks in a clear tone, at moderate volume and normal pace. Motor behavior appears normal. Eye contact is good. Pt's mood is anxious and affect is congruent with mood. Thought process is coherent and relevant. There is no indication Pt is currently responding to internal stimuli however Pt appears to be experiencing delusional thought content of unknown people wanting to harm him, which is his baseline.  Pt received MSE by Ailene Rud, NP.  Determination of need: Routine.    Pamalee Leyden, [facility], [facility] Triage Specialist [phone]

## 2021-03-01 ENCOUNTER — Ambulatory Visit (HOSPITAL_COMMUNITY)
Admission: EM | Admit: 2021-03-01 | Discharge: 2021-03-01 | Disposition: A | Discharge status: Home / Self Care | Payer: Medicaid Other | Attending: Psychiatry | Admitting: Psychiatry

## 2021-03-01 DIAGNOSIS — F17210 Nicotine dependence, cigarettes, uncomplicated: Secondary | ICD-10-CM | POA: Insufficient documentation

## 2021-03-01 DIAGNOSIS — Z20822 Contact with and (suspected) exposure to covid-19: Secondary | ICD-10-CM | POA: Diagnosis not present

## 2021-03-01 DIAGNOSIS — F200 Paranoid schizophrenia: Secondary | ICD-10-CM | POA: Insufficient documentation

## 2021-03-01 DIAGNOSIS — Z8659 Personal history of other mental and behavioral disorders: Secondary | ICD-10-CM | POA: Diagnosis not present

## 2021-03-01 LAB — COMPREHENSIVE METABOLIC PANEL
ALT: 13 U/L (ref 0–44)
AST: 19 U/L (ref 15–41)
Albumin: 4.1 g/dL (ref 3.5–5.0)
Alkaline Phosphatase: 42 U/L (ref 38–126)
Anion gap: 10 (ref 5–15)
BUN: 16 mg/dL (ref 6–20)
CO2: 28 mmol/L (ref 22–32)
Calcium: 9.1 mg/dL (ref 8.9–10.3)
Chloride: 101 mmol/L (ref 98–111)
Creatinine, Ser: 1.16 mg/dL (ref 0.61–1.24)
GFR, Estimated: >60 mL/min (ref >60)
Glucose, Bld: 72 mg/dL (ref 70–99)
Potassium: 3.7 mmol/L (ref 3.5–5.1)
Sodium: 139 mmol/L (ref 135–145)
Total Bilirubin: 0.9 mg/dL (ref 0.3–1.2)
Total Protein: 6.9 g/dL (ref 6.5–8.1)

## 2021-03-01 LAB — CBC WITH DIFFERENTIAL/PLATELET
Abs Immature Granulocytes: 0 10*3/uL (ref 0.00–0.07)
Basophils Absolute: 0 10*3/uL (ref 0.0–0.1)
Basophils Relative: 1 %
Eosinophils Absolute: 0.6 10*3/uL — ABNORMAL HIGH (ref 0.0–0.5)
Eosinophils Relative: 11 %
HCT: 45.5 % (ref 39.0–52.0)
Hemoglobin: 15.9 g/dL (ref 13.0–17.0)
Immature Granulocytes: 0 %
Lymphocytes Relative: 55 %
Lymphs Abs: 2.8 10*3/uL (ref 0.7–4.0)
MCH: 31.1 pg (ref 26.0–34.0)
MCHC: 34.9 g/dL (ref 30.0–36.0)
MCV: 88.9 fL (ref 80.0–100.0)
Monocytes Absolute: 0.3 10*3/uL (ref 0.1–1.0)
Monocytes Relative: 6 %
Neutro Abs: 1.4 10*3/uL — ABNORMAL LOW (ref 1.7–7.7)
Neutrophils Relative %: 27 %
Platelets: 259 10*3/uL (ref 150–400)
RBC: 5.12 MIL/uL (ref 4.22–5.81)
RDW: 12.3 % (ref 11.5–15.5)
WBC: 5.2 10*3/uL (ref 4.0–10.5)
nRBC: 0 % (ref 0.0–0.2)

## 2021-03-01 LAB — POCT URINE DRUG SCREEN - MANUAL ENTRY (I-SCREEN)
POC Amphetamine UR: NOT DETECTED
POC Buprenorphine (BUP): NOT DETECTED
POC Cocaine UR: NOT DETECTED
POC Marijuana UR: NOT DETECTED
POC Methadone UR: NOT DETECTED
POC Methamphetamine UR: NOT DETECTED
POC Morphine: NOT DETECTED
POC Oxazepam (BZO): NOT DETECTED
POC Oxycodone UR: NOT DETECTED
POC Secobarbital (BAR): NOT DETECTED

## 2021-03-01 LAB — LIPID PANEL
Cholesterol: 164 mg/dL (ref 0–200)
HDL: 72 mg/dL (ref >40)
LDL Cholesterol: 83 mg/dL (ref 0–99)
Total CHOL/HDL Ratio: 2.3 RATIO
Triglycerides: 43 mg/dL (ref <150)
VLDL: 9 mg/dL (ref 0–40)

## 2021-03-01 LAB — RESP PANEL BY RT-PCR (FLU A&B, COVID) ARPGX2
Influenza A by PCR: NEGATIVE
Influenza B by PCR: NEGATIVE
SARS Coronavirus 2 by RT PCR: NEGATIVE

## 2021-03-01 LAB — TSH: TSH: 4.285 u[IU]/mL (ref 0.350–4.500)

## 2021-03-01 LAB — POC SARS CORONAVIRUS 2 AG: SARSCOV2ONAVIRUS 2 AG: NEGATIVE

## 2021-03-01 LAB — ETHANOL: Alcohol, Ethyl (B): <10 mg/dL (ref <10)

## 2021-03-01 MED ORDER — ALBUTEROL SULFATE HFA 108 (90 BASE) MCG/ACT IN AERS
1.0000 | INHALATION_SPRAY | Freq: Four times a day (QID) | RESPIRATORY_TRACT | Status: DC | PRN
  Administered 2021-03-01: 2 via RESPIRATORY_TRACT
  Filled 2021-03-01: qty 6.7

## 2021-03-01 MED ORDER — ALUM & MAG HYDROXIDE-SIMETH 200-200-20 MG/5ML PO SUSP: 30.0000 mL | ORAL | Status: DC | PRN

## 2021-03-01 MED ORDER — MAGNESIUM HYDROXIDE 400 MG/5ML PO SUSP: 30.0000 mL | Freq: Every day | ORAL | Status: DC | PRN

## 2021-03-01 MED ORDER — ACETAMINOPHEN 325 MG PO TABS
650.0000 mg | ORAL_TABLET | Freq: Four times a day (QID) | ORAL | Status: DC | PRN
  Administered 2021-03-01: 650 mg via ORAL
  Filled 2021-03-01: qty 2

## 2021-03-01 NOTE — BH Assessment (Signed)
Comprehensive Clinical Assessment (CCA) Note  03/01/2021 [name] [id number]  Recommendations for Services/Supports/Treatments: Darrol Angel, NP, reviewed pt's chart and information and met with pt and determined pt should be observed overnight for safety and stability and re-assessed in the morning by psychiatry. Please note that pt was agreeable to taking his Invega injection if given a cold pack for his arm both before and after getting the injection.  The patient demonstrates the following risk factors for suicide: Chronic risk factors for suicide include: psychiatric disorder of Schizophrenia and demographic factors (male, >21 y/o). Acute risk factors for suicide include: family or marital conflict and social withdrawal/isolation. Protective factors for this patient include: hope for the future. Considering these factors, the overall suicide risk at this point appears to be none. Patient is appropriate for outpatient follow up.  Therefore, no sitter is necessary for suicide precautions.  [place] ED from 03/01/2021 in [facility] ED from 02/28/2021 in [place] DEPT Pre-admit from 02/22/2021 in [place] No Risk No Risk No Risk     Chief Complaint: No chief complaint on file.  Visit Diagnosis: F20.9, Schizophrenia  CCA Screening, Triage and Referral (STR) [name] is a 30 year old patient who came to the [facility]) by walking from his apartment. When asked why he was here, pt stated, "I don't know." Pt answered "I don't know" to most questions posed, though he was able to identify he is not experiencing SI, HI, or AVH. Pt does present as paranoid. Pt also denies the use of any substances, though pt's chart reports active use of marijuana and crack-cocaine.  Clinician and NP spoke with pt about taking his medication; initially, pt was  hesitant, stating that it hurt his arm. Clinician offered to allow pt to use a cold pack to help with the pain from the injection (putting the cold pack on before and after getting the shot) and pt was agreeable to trying this.  Pt's orientation and memory are [place]. Pt was cooperative throughout the assessment, though unable to provide information for the majority of the questions. Pt's insight, judgement, and impulse control is fair at this time.   Patient Reported Information How did you hear about [place]? Self  Referral name: [employer]/self  Referral phone number: 0 (N/A)   Whom do you see for routine medical problems? I don't have a doctor  Practice/Facility Name: No data recorded Practice/Facility Phone Number: No data recorded Name of Contact: No data recorded Contact Number: No data recorded Contact Fax Number: No data recorded Prescriber Name: No data recorded Prescriber Address (if known): No data recorded  What Is the Reason for Your Visit/Call Today? Pt states, "I don't know."  How Long Has This Been Causing You Problems? > than 6 months  What Do You Feel Would Help You the Most Today? Housing Assistance; Medication(s)   Have You Recently Been in Any Inpatient Treatment (Hospital/Detox/Crisis Center/28-Day Program)? No  Name/Location of Program/Hospital:[facility]  How Long Were You There? Per chart, "2 days."  When Were You Discharged? 02/24/2020 (Per chart.)   Have You Ever Received Services From [facility] Before? Yes  Who Do You See at [facility]? Pt has previous ED, [facility] and [facility] visits.   Have You Recently Had Any Thoughts About Hurting Yourself? No  Are You Planning to Commit Suicide/Harm Yourself At This time? No   Have you Recently Had Thoughts About Hurting Someone  Else? No  Explanation: No data recorded  Have You Used Any Alcohol or Drugs in the Past 24 Hours? -- (Pt denies, though he has a hx of consistent marijuana and crack-cocaine use)  How  Long Ago Did You Use Drugs or Alcohol? 0000 (Unclear)  What Did You Use and How Much? Pt denies, though he has a hx of consistent marijuana and crack-cocaine use   Do You Currently Have a Therapist/Psychiatrist? No  Name of Therapist/Psychiatrist: Pt denies   Have You Been Recently Discharged From Any Office Practice or Programs? No  Explanation of Discharge From Practice/Program: No data recorded    CCA Screening Triage Referral Assessment Type of Contact: Face-to-Face  Is this Initial or Reassessment? Initial Assessment  Date Telepsych consult ordered in [facility]:  02/24/2021  Time Telepsych consult ordered in [facility]:  2014   Patient Reported Information Reviewed? Yes  Patient Left Without Being Seen? No  Reason for Not Completing Assessment: No data recorded  Collateral Involvement: Reviewed Pt's medical record   Does Patient Have a [place]? No data recorded Name and Contact of Legal Guardian: [name], legal guardian.   If Minor and Not Living with Parent(s), Who has Custody? N/A  Is CPS involved or ever been involved? Never  Is APS involved or ever been involved? In the past   Patient Determined To Be At Risk for Harm To Self or Others Based on Review of Patient Reported Information or Presenting Complaint? No  Method: No Plan  Availability of Means: No access or NA  Intent: Vague intent or NA  Notification Required: No need or identified person  Additional Information for Danger to Others Potential: Active psychosis  Additional Comments for Danger to Others Potential: History of children playing outside triggering patient.  Are There Guns or Other Weapons in [place]? No  Types of Guns/Weapons: No data recorded Are These Weapons Safely Secured?                            No data recorded Who Could Verify You Are Able To Have These Secured: No data recorded Do You Have any Outstanding Charges, Pending Court Dates,  Parole/Probation? None noted  Contacted To Inform of Risk of Harm To Self or Others: Unable to Contact:   Location of Assessment: [facility]   Does Patient Present under Involuntary Commitment? No  IVC Papers Initial File Date: 10/22/2020   [place] of Residence: [place]   Patient Currently Receiving the Following Services: Not Receiving Services   Determination of Need: Urgent (48 hours)   Options For Referral: Medication Management; Outpatient Therapy; [facility]     CCA Biopsychosocial Intake/Chief Complaint:  Pt states, "I don't know."  Current Symptoms/Problems: Pt is wheezing. He has not had his Invega shot but has agreed to take it if he is given a cold pack to numb the area both before and after the shot.   Patient Reported Schizophrenia/Schizoaffective Diagnosis in Past: Yes   Strengths: Not assessed.  Preferences: Not assessed.  Abilities: Not assessed.   Type of Services Patient Feels are Needed: Pt states he does not know   Initial Clinical Notes/Concerns: None noted   Mental Health Symptoms Depression:  None   Duration of Depressive symptoms: Greater than two weeks   Mania:  Change in energy/activity; Increased Energy   Anxiety:   Tension; Worrying   Psychosis:  Affective flattening/alogia/avolition   Duration of  Psychotic symptoms: Greater than six months   Trauma:  None   Obsessions:  None   Compulsions:  None   Inattention:  None   Hyperactivity/Impulsivity:  N/A   Oppositional/Defiant Behaviors:  None   Emotional Irregularity:  None   Other Mood/Personality Symptoms:  None noted    Mental Status Exam Appearance and self-care  Stature:  Average   Weight:  Average weight   Clothing:  Age-appropriate; Casual   Grooming:  Normal   Cosmetic use:  None   Posture/gait:  Stooped   Motor activity:  Not Remarkable   Sensorium  Attention:  Normal   Concentration:  Normal   Orientation:  --  (UTA)   Recall/memory:  Normal   Affect and Mood  Affect:  Blunted   Mood:  Depressed   Relating  Eye contact:  Normal   Facial expression:  Constricted   Attitude toward examiner:  Cooperative   Thought and Language  Speech flow: Paucity   Thought content:  Appropriate to Mood and Circumstances   Preoccupation:  None   Hallucinations:  None   Organization:  No data recorded  [employer] of Knowledge:  Poor   Intelligence:  Needs investigation   Abstraction:  Abstract   Judgement:  Poor   Reality Testing:  Distorted   Insight:  Gaps   Decision Making:  Impulsive   Social Functioning  Social Maturity:  Impulsive   Social Judgement:  Heedless   Stress  Stressors:  Housing; Teacher, music Ability:  Overwhelmed   Skill Deficits:  Communication; Decision making; Self-care; Self-control   Supports:  Support needed     Religion: Religion/Spirituality Are You A Religious Person?:  (Not assessed) How Might This Affect Treatment?: Not assessed  Leisure/Recreation: Leisure / Recreation Do You Have Hobbies?:  (Not assessed)  Exercise/Diet: Exercise/Diet Do You Exercise?:  (Not assessed) Have You Gained or Lost A Significant Amount of Weight in the Past Six Months?:  (Not assessed) Do You Follow a Special Diet?:  (Not assessed) Do You Have Any Trouble Sleeping?:  (Not assessed) Explanation of Sleeping Difficulties: Not assessed   CCA Employment/Education Employment/Work Situation: Employment / Work Situation Employment situation: On disability Why is patient on disability: Mental health How long has patient been on disability: [employer] What is the longest time patient has a held a job?: Unknown 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 Has patient ever been in the [employer]?: No  Education: Education Is Patient Currently Attending School?: No Last Grade Completed: 9 Name of High School: Not assessed Did Teacher, adult education From  [employer]?:  (Not assessed) Did Physicist, medical?: No Did You Attend Graduate School?: No Did You Have Any Special Interests In School?: Not assessed Did You Have An Individualized Education Program (IIEP): No Did You Have Any Difficulty At School?: No Patient's Education Has Been Impacted by Current Illness:  (Not assessed)   CCA Family/Childhood History Family and Relationship History: Family history Marital status: Single Are you sexually active?:  (Not assessed) What is your sexual orientation?: Not assessed. Has your sexual activity been affected by drugs, alcohol, medication, or emotional stress?: Not assessed. Does patient have children?: No  Childhood History:  Childhood History By whom was/is the patient raised?:  (Not assessed) Additional childhood history information: Not assessed Description of patient's relationship with caregiver when they were a child: Not assessed Patient's description of current relationship with people who raised him/her: Not assessed How were you disciplined when  you got in trouble as a child/adolescent?: Not assessed Does patient have siblings?: No Did patient suffer any verbal/emotional/physical/sexual abuse as a child?: No Did patient suffer from severe childhood neglect?: No Has patient ever been sexually abused/assaulted/raped as an adolescent or adult?: No Was the patient ever a victim of a crime or a disaster?: No Witnessed domestic violence?: No Has patient been affected by domestic violence as an adult?: No  Child/Adolescent Assessment:     CCA Substance Use Alcohol/Drug Use: Alcohol / Drug Use Pain Medications: See MAR Prescriptions: See MAR Over the Counter: See MAR History of alcohol / drug use?:  (Pt denies SA) Longest period of sobriety (when/how long): Unknown Negative Consequences of Use:  (Pt denies SA) Withdrawal Symptoms:  (Pt denies SA)                         ASAM's:  Six Dimensions of  Multidimensional Assessment  Dimension 1:  Acute Intoxication and/or Withdrawal Potential:   Dimension 1:  Description of individual's past and current experiences of substance use and withdrawal: Pt reports that he use to smoke marijuana.  Dimension 2:  Biomedical Conditions and Complications:   Dimension 2:  Description of patient's biomedical conditions and  complications: Pt did not report medical  Dimension 3:  Emotional, Behavioral, or Cognitive Conditions and Complications:  Dimension 3:  Description of emotional, behavioral, or cognitive conditions and complications: Schizophrenia, Paranoid  Dimension 4:  Readiness to Change:  Dimension 4:  Description of Readiness to Change criteria: Pt reports that he is not taking medicaiton  Dimension 5:  Relapse, Continued use, or Continued Problem Potential:  Dimension 5:  Relapse, continued use, or continued problem potential critiera description: precontemplation  Dimension 6:  Recovery/Living Environment:  Dimension 6:  Recovery/Iiving environment criteria description: Pt reports that he lives in different areas  ASAM Severity Score: ASAM's Severity Rating Score: 6  ASAM Recommended Level of Treatment: ASAM Recommended Level of Treatment: Level I Outpatient Treatment   Substance use Disorder (SUD) Substance Use Disorder (SUD)  Checklist Symptoms of Substance Use: Continued use despite having a persistent/recurrent physical/psychological problem caused/exacerbated by use,Continued use despite persistent or recurrent social, interpersonal problems, caused or exacerbated by use  Recommendations for Services/Supports/Treatments: Recommendations for Services/Supports/Treatments Recommendations For Services/Supports/Treatments: Medication Management,Individual Therapy,Other (Comment) (Overnight observation)  Darrol Angel, NP, reviewed pt's chart and information and met with pt and determined pt should be observed overnight for safety and stability  and re-assessed in the morning by psychiatry. Please note that pt was agreeable to taking his Invega injection if given a cold pack for his arm both before and after getting the injection.  DSM5 Diagnoses: Patient Active Problem List   Diagnosis Date Noted  . 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 Schizophrenia, paranoid type ([place]) 10/24/2020  . Paranoid schizophrenia ([place]) 01/05/2020  . Other schizophrenia ([place]) 02/14/2018  . Cannabis use disorder, moderate, dependence ([place]) 05/10/2016    Patient Centered Plan: Patient is on the following Treatment Plan(s):  Anxiety, Impulse Control and Substance Abuse   Referrals to Alternative Service(s): Referred to Alternative Service(s):   Place:   Date:   Time:    Referred to Alternative Service(s):   Place:   Date:   Time:    Referred to Alternative Service(s):   Place:   Date:   Time:    Referred to Alternative Service(s):   Place:   Date:   Time:     Dannielle Burn, LMFT

## 2021-03-01 NOTE — Progress Notes (Signed)
Pt presents to [facility] lobby, reporting that he walked to this location from his apartment.  Most questions he answers with, "I don't know."  This visit will be patient's 40th ED visit in six months.    Patient initially reports that he "doesn't feed good" and then "I can't breathe good."  Pt's anterior and posterior chest sounds were assessed via auscultation with light wheezing mostly evident on expiration.   Pt is well known to behavorial health and presents near his known baseline.  He was pleasant and cooperative with physical exam and assessment process.  When questioned about auditory and/or visual hallucinations, he replied - "I don't know".  After fifteen minutes of observation, patient's breathing slowed and wheezing was less pronounced.  Vital signs within normal limits.

## 2021-03-01 NOTE — ED Notes (Signed)
AVS provided and Pt stated understanding. Personal belongings returned to Pt from locker. Pt escorted to the front lobby along with the ACT team nurse after receiving his injection. Pt alert, orient and ambulatory. Safety maintained.

## 2021-03-01 NOTE — ED Provider Notes (Signed)
Behavioral Health Admission [facility] & [facility])  Date: 03/01/21 Patient Name: [name] MRN: [id number] Chief Complaint:  Chief Complaint  Patient presents with  . Paranoid   Chief Complaint/Presenting Problem: Pt states, "I don't know."  Diagnoses:  Final diagnoses:  Paranoid schizophrenia (HCC)    HPI: [name] is a 30 y.o. male who presents voluntarily as a walk-in unaccompanied to the [facility].The patient has a significant past psychiatric history of paranoid schizophrenia and several visitsto the [facility] EDs and the behavioral health services with similar presentation.   Patient states, "I am scared for my life" He is unable to explain why he is scared for his life. He is unable to identify triggers, stressors, or events attributing to why he is afraid. He answers the assessment questions with "I don't know"responses.   On approach, he is alert and oriented x 3. His speech is coherent, tone is at a normal rate and his thought process is somewhat logical when he does answer questions. He is calm and cooperative during the assessment. He is dressed casually in pants and a t-shirt. He denies suicidal and homicidal ideations. He denies auditory and visual hallucinations. He does not appear to be responding to internal, or external stimuli. He denies feeling paranoid, however hepresents with paranoid thoughts that are chronic in nature. He denies using illicit drugs, including THC, heroin, or cocaine. He denies alcohol use. He denies having an ACT team. He denies taking his Invega injection and states that he doesn't like it because it causes his arm to hurt.   He denies that he is a threat to himself, or others. Therefore, we discussed him staying overnight at the [facility] for medication management. He agreed to receive the long-acting Invega injection while here at the [facility]. Patient verbalizes understanding.   Per chart review: Patient received Invega Sustenna 156 mg IM (every 28 days) on  01/16/21 at [facility].  PHQ 2-9:  Flowsheet Row OP [facility] from 05/08/2020 in [facility]  Thoughts that you would be better off dead, or of hurting yourself in some way Not at all Not at all  PHQ-9 Total Score 8 6      Flowsheet Row ED from 03/01/2021 in [facility] ED from 02/28/2021 in [facility] Pre-admit from 02/22/2021 [facility]  C-SSRS RISK CATEGORY No Risk No Risk No Risk       Total Time spent with patient: 20 minutes  Musculoskeletal  Strength & Muscle Tone: within normal limits Gait & Station: normal Patient leans: N/A  Psychiatric Specialty Exam  Presentation General Appearance: Appropriate for Environment  Eye Contact:Poor  Speech:Slow  Speech Volume:Decreased  Handedness:Right   Mood and Affect  Mood:Euthymic  Affect:Blunt   Thought Process  Thought Processes:Disorganized; Irrevelant  Descriptions of Associations:Circumstantial  Orientation:Partial  Thought Content:Paranoid Ideation; Tangential  Diagnosis of Schizophrenia or Schizoaffective disorder in past: Yes  Duration of Psychotic Symptoms: Greater than six months  Hallucinations:Hallucinations: None Description of Auditory Hallucinations: Someone is after me  Ideas of Reference:Paranoia  Suicidal Thoughts:Suicidal Thoughts: No  Homicidal Thoughts:Homicidal Thoughts: No   Sensorium  Memory:Immediate Fair; Recent Poor; Remote Poor  Judgment:Poor  Insight:Lacking   Executive Functions  Concentration:Poor  Attention Span:Poor  Recall:Poor  Fund of Knowledge:Poor  Language:Fair   Psychomotor Activity  Psychomotor Activity:Psychomotor Activity: Decreased   Assets  Assets:Desire for Improvement; Physical Health   Sleep  Sleep:Sleep: Fair Number of Hours of Sleep: -1   No data  recorded  Physical Exam Constitutional:      Appearance: Normal appearance.  HENT:     Head: Normocephalic and atraumatic.     Nose: Nose normal.  Eyes:     Conjunctiva/sclera: Conjunctivae normal.  Cardiovascular:     Rate and Rhythm: Normal rate.  Pulmonary:     Effort: Pulmonary effort is normal.  Musculoskeletal:        General: Normal range of motion.     Cervical back: Normal range of motion.  Neurological:     General: No focal deficit present.     Mental Status: He is alert and oriented to person, place, and time.    Review of Systems  Constitutional: Negative.   HENT: Negative.   Eyes: Negative.   Respiratory: Positive for wheezing.   Cardiovascular: Negative.   Gastrointestinal: Negative.   Genitourinary: Negative.   Musculoskeletal: Negative.   Skin: Negative.   Neurological: Negative.   Endo/Heme/Allergies: Negative.   Psychiatric/Behavioral: Negative for depression and suicidal ideas. The patient does not have insomnia.        Paranoia     Blood pressure 118/88, pulse 91, temperature (!) 97.5 F (36.4 C), resp. rate 18, SpO2 97 %. There is no height or weight on file to calculate BMI.  Past Psychiatric History: Paranoid Schizophrenia  Is the patient at risk to self? No  Has the patient been a risk to self in the past 6 months? No .    Has the patient been a risk to self within the distant past? No   Is the patient a risk to others? No   Has the patient been a risk to others in the past 6 months? No   Has the patient been a risk to others within the distant past? No   Past Medical History:  Past Medical History:  Diagnosis Date  . Depression   . Schizophrenia [facility])     Past Surgical History:  Procedure Laterality Date  . BACK SURGERY      Family History: No family history on file.  Social History:  Social History   Socioeconomic History  . Marital status: Single    Spouse name: Not on file  . Number of children: Not on file  . Years of  education: Not on file  . Highest education level: Not on file  Occupational History  . Occupation: unemployed  Tobacco Use  . Smoking status: Current Some Day Smoker    Types: Cigarettes  . Smokeless tobacco: Never Used  Vaping Use  . Vaping Use: Never used  Substance and Sexual Activity  . Alcohol use: No  . Drug use: Yes    Types: Marijuana    Comment: rare  . Sexual activity: Yes    Birth control/protection: Condom  Other Topics Concern  . Not on file  Social History Narrative   ** Merged History Encounter **       ** Merged History Encounter **       UTA certain topics - tangential thought process, cannot answer clearly. Pt not sure about school; reportedly is homeless.    Social Determinants of Health   Financial Resource Strain: Not on file  Food Insecurity: Not on file  Transportation Needs: Not on file  Physical Activity: Not on file  Stress: Not on file  Social Connections: Not on file  Intimate Partner Violence: Not on file    SDOH:  SDOH  Screenings   Alcohol Screen: Not on file  Depression (PHQ2-9): Medium Risk  . PHQ-2 Score: 8  Financial Resource Strain: Not on file  Food Insecurity: Not on file  Housing: Not on file  Physical Activity: Not on file  Social Connections: Not on file  Stress: Not on file  Tobacco Use: High Risk  . Smoking Tobacco Use: Current Some Day Smoker  . Smokeless Tobacco Use: Never Used  Transportation Needs: Not on file    Last Labs:  Admission on 03/01/2021  Component Date Value Ref Range Status  . SARS Coronavirus 2 by RT PCR 03/01/2021 NEGATIVE  NEGATIVE Final   Comment: (NOTE) SARS-CoV-2 target nucleic acids are NOT DETECTED.  The SARS-CoV-2 RNA is generally detectable in upper respiratory specimens during the acute phase of infection. The lowest concentration of SARS-CoV-2 viral copies this assay can detect is 138 copies/mL. A negative result does not preclude SARS-Cov-2 infection and should not be used as the  sole basis for treatment or other patient management decisions. A negative result may occur with  improper specimen collection/handling, submission of specimen other than nasopharyngeal swab, presence of viral mutation(s) within the areas targeted by this assay, and inadequate number of viral copies(<138 copies/mL). A negative result must be combined with clinical observations, patient history, and epidemiological information. The expected result is Negative.  Fact Sheet for Patients:  BloggerCourse.com  Fact Sheet for Healthcare Providers:  SeriousBroker.it  This test is no                          t yet approved or cleared by the [place] FDA and  has been authorized for detection and/or diagnosis of SARS-CoV-2 by FDA under an Emergency Use Authorization (EUA). This EUA will remain  in effect (meaning this test can be used) for the duration of the COVID-19 declaration under Section 564(b)(1) of the Act, 21 U.S.C.section 360bbb-3(b)(1), unless the authorization is terminated  or revoked sooner.      . Influenza A by PCR 03/01/2021 NEGATIVE  NEGATIVE Final  . Influenza B by PCR 03/01/2021 NEGATIVE  NEGATIVE Final   Comment: (NOTE) The Xpert Xpress SARS-CoV-2/FLU/RSV plus assay is intended as an aid in the diagnosis of influenza from Nasopharyngeal swab specimens and should not be used as a sole basis for treatment. Nasal washings and aspirates are unacceptable for Xpert Xpress SARS-CoV-2/FLU/RSV testing.  Fact Sheet for Patients: BloggerCourse.com  Fact Sheet for Healthcare Providers: SeriousBroker.it  This test is not yet approved or cleared by the [place] FDA and has been authorized for detection and/or diagnosis of SARS-CoV-2 by FDA under an Emergency Use Authorization (EUA). This EUA will remain in effect (meaning this test can be used) for the duration of  the COVID-19 declaration under Section 564(b)(1) of the Act, 21 U.S.C. section 360bbb-3(b)(1), unless the authorization is terminated or revoked.  Performed at [facility], [address], [place], [place] [address]   . POC Amphetamine UR 03/01/2021 None Detected  NONE DETECTED (Cut Off Level 1000 ng/mL) Final  . POC Secobarbital (BAR) 03/01/2021 None Detected  NONE DETECTED (Cut Off Level 300 ng/mL) Final  . POC Buprenorphine (BUP) 03/01/2021 None Detected  NONE DETECTED (Cut Off Level 10 ng/mL) Final  . POC Oxazepam (BZO) 03/01/2021 None Detected  NONE DETECTED (Cut Off Level 300 ng/mL) Final  . POC Cocaine UR 03/01/2021 None Detected  NONE DETECTED (Cut Off Level 300 ng/mL) Final  . POC Methamphetamine  UR 03/01/2021 None Detected  NONE DETECTED (Cut Off Level 1000 ng/mL) Final  . POC Morphine 03/01/2021 None Detected  NONE DETECTED (Cut Off Level 300 ng/mL) Final  . POC Oxycodone UR 03/01/2021 None Detected  NONE DETECTED (Cut Off Level 100 ng/mL) Final  . POC Methadone UR 03/01/2021 None Detected  NONE DETECTED (Cut Off Level 300 ng/mL) Final  . POC Marijuana UR 03/01/2021 None Detected  NONE DETECTED (Cut Off Level 50 ng/mL) Final  . SARSCOV2ONAVIRUS 2 AG 03/01/2021 NEGATIVE  NEGATIVE Final   Comment: (NOTE) SARS-CoV-2 antigen NOT DETECTED.   Negative results are presumptive.  Negative results do not preclude SARS-CoV-2 infection and should not be used as the sole basis for treatment or other patient management decisions, including infection  control decisions, particularly in the presence of clinical signs and  symptoms consistent with COVID-19, or in those who have been in contact with the virus.  Negative results must be combined with clinical observations, patient history, and epidemiological information. The expected result is Negative.  Fact Sheet for Patients: https://www.jennings-kim.com/  Fact Sheet for Healthcare  Providers: https://alexander-rogers.biz/  This test is not yet approved or cleared by the [place] FDA and  has been authorized for detection and/or diagnosis of SARS-CoV-2 by FDA under an Emergency Use Authorization (EUA).  This EUA will remain in effect (meaning this test can be used) for the duration of  the COV                          ID-19 declaration under Section 564(b)(1) of the Act, 21 U.S.C. section 360bbb-3(b)(1), unless the authorization is terminated or revoked sooner.    Admission on 02/20/2021, Discharged on 02/20/2021  Component Date Value Ref Range Status  . SARS Coronavirus 2 by RT PCR 02/20/2021 NEGATIVE  NEGATIVE Final   Comment: (NOTE) SARS-CoV-2 target nucleic acids are NOT DETECTED.  The SARS-CoV-2 RNA is generally detectable in upper respiratory specimens during the acute phase of infection. The lowest concentration of SARS-CoV-2 viral copies this assay can detect is 138 copies/mL. A negative result does not preclude SARS-Cov-2 infection and should not be used as the sole basis for treatment or other patient management decisions. A negative result may occur with  improper specimen collection/handling, submission of specimen other than nasopharyngeal swab, presence of viral mutation(s) within the areas targeted by this assay, and inadequate number of viral copies(<138 copies/mL). A negative result must be combined with clinical observations, patient history, and epidemiological information. The expected result is Negative.  Fact Sheet for Patients:  BloggerCourse.com  Fact Sheet for Healthcare Providers:  SeriousBroker.it  This test is no                          t yet approved or cleared by the [place] FDA and  has been authorized for detection and/or diagnosis of SARS-CoV-2 by FDA under an Emergency Use Authorization (EUA). This EUA will remain  in effect (meaning this test can  be used) for the duration of the COVID-19 declaration under Section 564(b)(1) of the Act, 21 U.S.C.section 360bbb-3(b)(1), unless the authorization is terminated  or revoked sooner.      . Influenza A by PCR 02/20/2021 NEGATIVE  NEGATIVE Final  . Influenza B by PCR 02/20/2021 NEGATIVE  NEGATIVE Final   Comment: (NOTE) The Xpert Xpress SARS-CoV-2/FLU/RSV plus assay is intended as an aid in the diagnosis of influenza from Nasopharyngeal swab specimens  and should not be used as a sole basis for treatment. Nasal washings and aspirates are unacceptable for Xpert Xpress SARS-CoV-2/FLU/RSV testing.  Fact Sheet for Patients: BloggerCourse.com  Fact Sheet for Healthcare Providers: SeriousBroker.it  This test is not yet approved or cleared by the [place] FDA and has been authorized for detection and/or diagnosis of SARS-CoV-2 by FDA under an Emergency Use Authorization (EUA). This EUA will remain in effect (meaning this test can be used) for the duration of the COVID-19 declaration under Section 564(b)(1) of the Act, 21 U.S.C. section 360bbb-3(b)(1), unless the authorization is terminated or revoked.  Performed at [facility], [address], [place], [place] [address]   . Sodium 02/20/2021 138  135 - 145 mmol/L Final  . Potassium 02/20/2021 3.6  3.5 - 5.1 mmol/L Final  . Chloride 02/20/2021 105  98 - 111 mmol/L Final  . CO2 02/20/2021 27  22 - 32 mmol/L Final  . Glucose, Bld 02/20/2021 102* 70 - 99 mg/dL Final   Glucose reference range applies only to samples taken after fasting for at least 8 hours.  . BUN 02/20/2021 16  6 - 20 mg/dL Final  . Creatinine, Ser 02/20/2021 1.09  0.61 - 1.24 mg/dL Final  . Calcium 62/13/0865 9.1  8.9 - 10.3 mg/dL Final  . Total Protein 02/20/2021 6.8  6.5 - 8.1 g/dL Final  . Albumin 78/46/9629 4.2  3.5 - 5.0 g/dL Final  . AST 52/84/1324 21  15 - 41 U/L Final  . ALT 02/20/2021  12  0 - 44 U/L Final  . Alkaline Phosphatase 02/20/2021 41  38 - 126 U/L Final  . Total Bilirubin 02/20/2021 0.7  0.3 - 1.2 mg/dL Final  . GFR, Estimated 02/20/2021 >60  >60 mL/min Final   Comment: (NOTE) Calculated using the CKD-EPI Creatinine Equation (2021)   . Anion gap 02/20/2021 6  5 - 15 Final   Performed at [facility], [address], [place], [place] [address]  . Alcohol, Ethyl (B) 02/20/2021 <10  <10 mg/dL Final   Comment: (NOTE) Lowest detectable limit for serum alcohol is 10 mg/dL.  For medical purposes only. Performed at [facility], [address], [place], [place] [address]   . WBC 02/20/2021 6.2  4.0 - 10.5 K/uL Final  . RBC 02/20/2021 4.85  4.22 - 5.81 MIL/uL Final  . Hemoglobin 02/20/2021 15.1  13.0 - 17.0 g/dL Final  . HCT 64/40/3474 43.1  39.0 - 52.0 % Final  . MCV 02/20/2021 88.9  80.0 - 100.0 fL Final  . MCH 02/20/2021 31.1  26.0 - 34.0 pg Final  . MCHC 02/20/2021 35.0  30.0 - 36.0 g/dL Final  . RDW 25/95/6387 12.6  11.5 - 15.5 % Final  . Platelets 02/20/2021 255  150 - 400 K/uL Final  . nRBC 02/20/2021 0.0  0.0 - 0.2 % Final  . Neutrophils Relative % 02/20/2021 36  % Final  . Neutro Abs 02/20/2021 2.2  1.7 - 7.7 K/uL Final  . Lymphocytes Relative 02/20/2021 52  % Final  . Lymphs Abs 02/20/2021 3.3  0.7 - 4.0 K/uL Final  . Monocytes Relative 02/20/2021 6  % Final  . Monocytes Absolute 02/20/2021 0.3  0.1 - 1.0 K/uL Final  . Eosinophils Relative 02/20/2021 5  % Final  . Eosinophils Absolute 02/20/2021 0.3  0.0 - 0.5 K/uL Final  . Basophils Relative 02/20/2021 1  % Final  . Basophils Absolute 02/20/2021 0.0  0.0 - 0.1 K/uL  Final  . Immature Granulocytes 02/20/2021 0  % Final  . Abs Immature Granulocytes 02/20/2021 0.01  0.00 - 0.07 K/uL Final   Performed at [facility], [address] [place], [place] [address]  Admission on 01/31/2021, Discharged on 02/01/2021  Component Date Value Ref Range  Status  . SARS Coronavirus 2 by RT PCR 01/31/2021 NEGATIVE  NEGATIVE Final   Comment: (NOTE) SARS-CoV-2 target nucleic acids are NOT DETECTED.  The SARS-CoV-2 RNA is generally detectable in upper respiratory specimens during the acute phase of infection. The lowest concentration of SARS-CoV-2 viral copies this assay can detect is 138 copies/mL. A negative result does not preclude SARS-Cov-2 infection and should not be used as the sole basis for treatment or other patient management decisions. A negative result may occur with  improper specimen collection/handling, submission of specimen other than nasopharyngeal swab, presence of viral mutation(s) within the areas targeted by this assay, and inadequate number of viral copies(<138 copies/mL). A negative result must be combined with clinical observations, patient history, and epidemiological information. The expected result is Negative.  Fact Sheet for Patients:  BloggerCourse.com  Fact Sheet for Healthcare Providers:  SeriousBroker.it  This test is no                          t yet approved or cleared by the [place] FDA and  has been authorized for detection and/or diagnosis of SARS-CoV-2 by FDA under an Emergency Use Authorization (EUA). This EUA will remain  in effect (meaning this test can be used) for the duration of the COVID-19 declaration under Section 564(b)(1) of the Act, 21 U.S.C.section 360bbb-3(b)(1), unless the authorization is terminated  or revoked sooner.      . Influenza A by PCR 01/31/2021 NEGATIVE  NEGATIVE Final  . Influenza B by PCR 01/31/2021 NEGATIVE  NEGATIVE Final   Comment: (NOTE) The Xpert Xpress SARS-CoV-2/FLU/RSV plus assay is intended as an aid in the diagnosis of influenza from Nasopharyngeal swab specimens and should not be used as a sole basis for treatment. Nasal washings and aspirates are unacceptable for Xpert Xpress  SARS-CoV-2/FLU/RSV testing.  Fact Sheet for Patients: BloggerCourse.com  Fact Sheet for Healthcare Providers: SeriousBroker.it  This test is not yet approved or cleared by the [place] FDA and has been authorized for detection and/or diagnosis of SARS-CoV-2 by FDA under an Emergency Use Authorization (EUA). This EUA will remain in effect (meaning this test can be used) for the duration of the COVID-19 declaration under Section 564(b)(1) of the Act, 21 U.S.C. section 360bbb-3(b)(1), unless the authorization is terminated or revoked.  Performed at [facility], [address], [place], [place] [address]   . WBC 01/31/2021 6.1  4.0 - 10.5 K/uL Final  . RBC 01/31/2021 4.78  4.22 - 5.81 MIL/uL Final  . Hemoglobin 01/31/2021 14.9  13.0 - 17.0 g/dL Final  . HCT 09/81/1914 43.4  39.0 - 52.0 % Final  . MCV 01/31/2021 90.8  80.0 - 100.0 fL Final  . MCH 01/31/2021 31.2  26.0 - 34.0 pg Final  . MCHC 01/31/2021 34.3  30.0 - 36.0 g/dL Final  . RDW 78/29/5621 13.0  11.5 - 15.5 % Final  . Platelets 01/31/2021 235  150 - 400 K/uL Final  . nRBC 01/31/2021 0.0  0.0 - 0.2 % Final  . Neutrophils Relative % 01/31/2021 47  % Final  . Neutro Abs 01/31/2021 2.9  1.7 - 7.7 K/uL Final  .  Lymphocytes Relative 01/31/2021 36  % Final  . Lymphs Abs 01/31/2021 2.2  0.7 - 4.0 K/uL Final  . Monocytes Relative 01/31/2021 9  % Final  . Monocytes Absolute 01/31/2021 0.6  0.1 - 1.0 K/uL Final  . Eosinophils Relative 01/31/2021 7  % Final  . Eosinophils Absolute 01/31/2021 0.4  0.0 - 0.5 K/uL Final  . Basophils Relative 01/31/2021 1  % Final  . Basophils Absolute 01/31/2021 0.0  0.0 - 0.1 K/uL Final  . Immature Granulocytes 01/31/2021 0  % Final  . Abs Immature Granulocytes 01/31/2021 0.01  0.00 - 0.07 K/uL Final   Performed at [facility], [address] [place], [place] [address]  . B Natriuretic Peptide 01/31/2021  24.9  0.0 - 100.0 pg/mL Final   Performed at [facility], [address], [place], [place] [address]  . D-Dimer, Quant 01/31/2021 <0.27  0.00 - 0.50 ug/mL-FEU Final   Comment: (NOTE) At the manufacturer cut-off value of 0.5 g/mL FEU, this assay has a negative predictive value of 95-100%.This assay is intended for use in conjunction with a clinical pretest probability (PTP) assessment model to exclude pulmonary embolism (PE) and deep venous thrombosis (DVT) in outpatients suspected of PE or DVT. Results should be correlated with clinical presentation. Performed at [facility], [address], [place], [place] [address]   . Glucose-Capillary 01/31/2021 86  70 - 99 mg/dL Final   Glucose reference range applies only to samples taken after fasting for at least 8 hours.  . Sodium 01/31/2021 140  135 - 145 mmol/L Final  . Potassium 01/31/2021 3.3* 3.5 - 5.1 mmol/L Final  . Chloride 01/31/2021 102  98 - 111 mmol/L Final  . CO2 01/31/2021 28  22 - 32 mmol/L Final  . Glucose, Bld 01/31/2021 88  70 - 99 mg/dL Final   Glucose reference range applies only to samples taken after fasting for at least 8 hours.  . BUN 01/31/2021 17  6 - 20 mg/dL Final  . Creatinine, Ser 01/31/2021 1.25* 0.61 - 1.24 mg/dL Final  . Calcium 21/30/8657 9.2  8.9 - 10.3 mg/dL Final  . Total Protein 01/31/2021 6.9  6.5 - 8.1 g/dL Final  . Albumin 84/69/6295 4.2  3.5 - 5.0 g/dL Final  . AST 28/41/3244 26  15 - 41 U/L Final  . ALT 01/31/2021 14  0 - 44 U/L Final  . Alkaline Phosphatase 01/31/2021 43  38 - 126 U/L Final  . Total Bilirubin 01/31/2021 0.8  0.3 - 1.2 mg/dL Final  . GFR, Estimated 01/31/2021 >60  >60 mL/min Final   Comment: (NOTE) Calculated using the CKD-EPI Creatinine Equation (2021)   . Anion gap 01/31/2021 10  5 - 15 Final   Performed at [facility], [address] [place], [place] [address]  . Salicylate Lvl 01/31/2021 <7.0* 7.0 - 30.0 mg/dL  Final   Performed at [facility], [address] [place], [place] [address]  . Acetaminophen (Tylenol), Serum 01/31/2021 <10* 10 - 30 ug/mL Final   Comment: (NOTE) Therapeutic concentrations vary significantly. A range of 10-30 ug/mL  may be an effective concentration for many patients. However, some  are best treated at concentrations outside of this range. Acetaminophen concentrations >150 ug/mL at 4 hours after ingestion  and >50 ug/mL at 12 hours after ingestion are often associated with  toxic reactions.  Performed at [facility], [address] [place], [place] [address]   .  Alcohol, Ethyl (B) 01/31/2021 <10  <10 mg/dL Final   Comment: (NOTE) Lowest detectable limit for serum alcohol is 10 mg/dL.  For medical purposes only. Performed at [facility], [address] [place], [place] [address]   Admission on 01/18/2021, Discharged on 01/18/2021  Component Date Value Ref Range Status  . Sodium 01/18/2021 136  135 - 145 mmol/L Final  . Potassium 01/18/2021 3.9  3.5 - 5.1 mmol/L Final  . Chloride 01/18/2021 103  98 - 111 mmol/L Final  . CO2 01/18/2021 24  22 - 32 mmol/L Final  . Glucose, Bld 01/18/2021 88  70 - 99 mg/dL Final   Glucose reference range applies only to samples taken after fasting for at least 8 hours.  . BUN 01/18/2021 24* 6 - 20 mg/dL Final  . Creatinine, Ser 01/18/2021 1.32* 0.61 - 1.24 mg/dL Final  . Calcium 60/45/4098 8.8* 8.9 - 10.3 mg/dL Final  . Total Protein 01/18/2021 6.9  6.5 - 8.1 g/dL Final  . Albumin 11/91/4782 4.1  3.5 - 5.0 g/dL Final  . AST 95/62/1308 21  15 - 41 U/L Final  . ALT 01/18/2021 15  0 - 44 U/L Final  . Alkaline Phosphatase 01/18/2021 42  38 - 126 U/L Final  . Total Bilirubin 01/18/2021 0.6  0.3 - 1.2 mg/dL Final  . GFR, Estimated 01/18/2021 >60  >60 mL/min Final   Comment: (NOTE) Calculated using the CKD-EPI Creatinine Equation (2021)   . Anion gap 01/18/2021 9  5 - 15  Final   Performed at [facility], [address], [place], [place] [address]  . Alcohol, Ethyl (B) 01/18/2021 <10  <10 mg/dL Final   Comment: (NOTE) Lowest detectable limit for serum alcohol is 10 mg/dL.  For medical purposes only. Performed at [facility], [address] [place], [place] [address]   . Salicylate Lvl 01/18/2021 <7.0* 7.0 - 30.0 mg/dL Final   Performed at [facility], [address], [place], [place] [address]  . Acetaminophen (Tylenol), Serum 01/18/2021 <10* 10 - 30 ug/mL Final   Comment: (NOTE) Therapeutic concentrations vary significantly. A range of 10-30 ug/mL  may be an effective concentration for many patients. However, some  are best treated at concentrations outside of this range. Acetaminophen concentrations >150 ug/mL at 4 hours after ingestion  and >50 ug/mL at 12 hours after ingestion are often associated with  toxic reactions.  Performed at [facility], [address] [place], [place] [address]   . WBC 01/18/2021 6.9  4.0 - 10.5 K/uL Final  . RBC 01/18/2021 4.52  4.22 - 5.81 MIL/uL Final  . Hemoglobin 01/18/2021 13.8  13.0 - 17.0 g/dL Final  . HCT 08/05/2535 41.1  39.0 - 52.0 % Final  . MCV 01/18/2021 90.9  80.0 - 100.0 fL Final  . MCH 01/18/2021 30.5  26.0 - 34.0 pg Final  . MCHC 01/18/2021 33.6  30.0 - 36.0 g/dL Final  . RDW 64/40/3474 12.9  11.5 - 15.5 % Final  . Platelets 01/18/2021 231  150 - 400 K/uL Final  . nRBC 01/18/2021 0.0  0.0 - 0.2 % Final   Performed at [facility], [address], [place], [place] [address]  . Opiates 01/18/2021 NONE DETECTED  NONE DETECTED Final  . Cocaine 01/18/2021 NONE DETECTED  NONE DETECTED Final  . Benzodiazepines 01/18/2021 NONE DETECTED  NONE DETECTED Final  . Amphetamines 01/18/2021 NONE DETECTED  NONE DETECTED Final  . Tetrahydrocannabinol  01/18/2021 NONE DETECTED  NONE DETECTED Final  . Barbiturates  01/18/2021 NONE DETECTED  NONE DETECTED Final   Comment: (NOTE) DRUG SCREEN FOR MEDICAL PURPOSES ONLY.  IF CONFIRMATION IS NEEDED FOR ANY PURPOSE, NOTIFY LAB WITHIN 5 DAYS.  LOWEST DETECTABLE LIMITS FOR URINE DRUG SCREEN Drug Class                     Cutoff (ng/mL) Amphetamine and metabolites    1000 Barbiturate and metabolites    200 Benzodiazepine                 200 Tricyclics and metabolites     300 Opiates and metabolites        300 Cocaine and metabolites        300 THC                            50 Performed at [facility], [address], [place], [place] [address]   Admission on 01/15/2021, Discharged on 01/16/2021  Component Date Value Ref Range Status  . WBC 01/15/2021 6.9  4.0 - 10.5 K/uL Final  . RBC 01/15/2021 4.97  4.22 - 5.81 MIL/uL Final  . Hemoglobin 01/15/2021 15.4  13.0 - 17.0 g/dL Final  . HCT 11/91/4782 45.3  39.0 - 52.0 % Final  . MCV 01/15/2021 91.1  80.0 - 100.0 fL Final  . MCH 01/15/2021 31.0  26.0 - 34.0 pg Final  . MCHC 01/15/2021 34.0  30.0 - 36.0 g/dL Final  . RDW 95/62/1308 13.2  11.5 - 15.5 % Final  . Platelets 01/15/2021 283  150 - 400 K/uL Final  . nRBC 01/15/2021 0.0  0.0 - 0.2 % Final  . Neutrophils Relative % 01/15/2021 38  % Final  . Neutro Abs 01/15/2021 2.6  1.7 - 7.7 K/uL Final  . Lymphocytes Relative 01/15/2021 44  % Final  . Lymphs Abs 01/15/2021 3.1  0.7 - 4.0 K/uL Final  . Monocytes Relative 01/15/2021 8  % Final  . Monocytes Absolute 01/15/2021 0.6  0.1 - 1.0 K/uL Final  . Eosinophils Relative 01/15/2021 9  % Final  . Eosinophils Absolute 01/15/2021 0.6* 0.0 - 0.5 K/uL Final  . Basophils Relative 01/15/2021 1  % Final  . Basophils Absolute 01/15/2021 0.1  0.0 - 0.1 K/uL Final  . Immature Granulocytes 01/15/2021 0  % Final  . Abs Immature Granulocytes 01/15/2021 0.01  0.00 - 0.07 K/uL Final   Performed at [facility], [address] [place], [place] [address]  . Sodium 01/15/2021 137   135 - 145 mmol/L Final  . Potassium 01/15/2021 3.5  3.5 - 5.1 mmol/L Final  . Chloride 01/15/2021 100  98 - 111 mmol/L Final  . CO2 01/15/2021 28  22 - 32 mmol/L Final  . Glucose, Bld 01/15/2021 97  70 - 99 mg/dL Final   Glucose reference range applies only to samples taken after fasting for at least 8 hours.  . BUN 01/15/2021 18  6 - 20 mg/dL Final  . Creatinine, Ser 01/15/2021 1.19  0.61 - 1.24 mg/dL Final  . Calcium 69/62/9528 9.1  8.9 - 10.3 mg/dL Final  . Total Protein 01/15/2021 7.1  6.5 - 8.1 g/dL Final  . Albumin 41/32/4401 4.2  3.5 - 5.0 g/dL Final  . AST 02/72/5366 23  15 - 41 U/L Final  . ALT 01/15/2021 17  0 - 44 U/L Final  . Alkaline  Phosphatase 01/15/2021 47  38 - 126 U/L Final  . Total Bilirubin 01/15/2021 1.0  0.3 - 1.2 mg/dL Final  . GFR, Estimated 01/15/2021 >60  >60 mL/min Final   Comment: (NOTE) Calculated using the CKD-EPI Creatinine Equation (2021)   . Anion gap 01/15/2021 9  5 - 15 Final   Performed at [facility], [address] [place], [place] [address]  . Salicylate Lvl 01/15/2021 <7.0* 7.0 - 30.0 mg/dL Final   Performed at [facility], [address], [place], [place] [address]  . Acetaminophen (Tylenol), Serum 01/15/2021 <10* 10 - 30 ug/mL Final   Comment: (NOTE) Therapeutic concentrations vary significantly. A range of 10-30 ug/mL  may be an effective concentration for many patients. However, some  are best treated at concentrations outside of this range. Acetaminophen concentrations >150 ug/mL at 4 hours after ingestion  and >50 ug/mL at 12 hours after ingestion are often associated with  toxic reactions.  Performed at [facility], [address], [place], [place] [address]   Admission on 01/09/2021, Discharged on 01/09/2021  Component Date Value Ref Range Status  . SARS Coronavirus 2 by RT PCR 01/09/2021 NEGATIVE  NEGATIVE Final   Comment: (NOTE) SARS-CoV-2 target nucleic acids are NOT  DETECTED.  The SARS-CoV-2 RNA is generally detectable in upper respiratory specimens during the acute phase of infection. The lowest concentration of SARS-CoV-2 viral copies this assay can detect is 138 copies/mL. A negative result does not preclude SARS-Cov-2 infection and should not be used as the sole basis for treatment or other patient management decisions. A negative result may occur with  improper specimen collection/handling, submission of specimen other than nasopharyngeal swab, presence of viral mutation(s) within the areas targeted by this assay, and inadequate number of viral copies(<138 copies/mL). A negative result must be combined with clinical observations, patient history, and epidemiological information. The expected result is Negative.  Fact Sheet for Patients:  BloggerCourse.com  Fact Sheet for Healthcare Providers:  SeriousBroker.it  This test is no                          t yet approved or cleared by the [place] FDA and  has been authorized for detection and/or diagnosis of SARS-CoV-2 by FDA under an Emergency Use Authorization (EUA). This EUA will remain  in effect (meaning this test can be used) for the duration of the COVID-19 declaration under Section 564(b)(1) of the Act, 21 U.S.C.section 360bbb-3(b)(1), unless the authorization is terminated  or revoked sooner.      . Influenza A by PCR 01/09/2021 NEGATIVE  NEGATIVE Final  . Influenza B by PCR 01/09/2021 NEGATIVE  NEGATIVE Final   Comment: (NOTE) The Xpert Xpress SARS-CoV-2/FLU/RSV plus assay is intended as an aid in the diagnosis of influenza from Nasopharyngeal swab specimens and should not be used as a sole basis for treatment. Nasal washings and aspirates are unacceptable for Xpert Xpress SARS-CoV-2/FLU/RSV testing.  Fact Sheet for Patients: BloggerCourse.com  Fact Sheet for Healthcare  Providers: SeriousBroker.it  This test is not yet approved or cleared by the [place] FDA and has been authorized for detection and/or diagnosis of SARS-CoV-2 by FDA under an Emergency Use Authorization (EUA). This EUA will remain in effect (meaning this test can be used) for the duration of the COVID-19 declaration under Section 564(b)(1) of the Act, 21 U.S.C. section 360bbb-3(b)(1), unless the authorization is terminated or revoked.  Performed at [facility], [address] [place], [place] [address]   . WBC 01/09/2021 6.9  4.0 - 10.5 K/uL Final  . RBC 01/09/2021 4.40  4.22 - 5.81 MIL/uL Final  . Hemoglobin 01/09/2021 13.6  13.0 - 17.0 g/dL Final  . HCT 19/14/7829 39.6  39.0 - 52.0 % Final  . MCV 01/09/2021 90.0  80.0 - 100.0 fL Final  . MCH 01/09/2021 30.9  26.0 - 34.0 pg Final  . MCHC 01/09/2021 34.3  30.0 - 36.0 g/dL Final  . RDW 56/21/3086 12.9  11.5 - 15.5 % Final  . Platelets 01/09/2021 235  150 - 400 K/uL Final  . nRBC 01/09/2021 0.0  0.0 - 0.2 % Final  . Neutrophils Relative % 01/09/2021 23  % Final  . Neutro Abs 01/09/2021 1.6* 1.7 - 7.7 K/uL Final  . Lymphocytes Relative 01/09/2021 50  % Final  . Lymphs Abs 01/09/2021 3.5  0.7 - 4.0 K/uL Final  . Monocytes Relative 01/09/2021 7  % Final  . Monocytes Absolute 01/09/2021 0.5  0.1 - 1.0 K/uL Final  . Eosinophils Relative 01/09/2021 19  % Final  . Eosinophils Absolute 01/09/2021 1.3* 0.0 - 0.5 K/uL Final  . Basophils Relative 01/09/2021 1  % Final  . Basophils Absolute 01/09/2021 0.1  0.0 - 0.1 K/uL Final  . Immature Granulocytes 01/09/2021 0  % Final  . Abs Immature Granulocytes 01/09/2021 0.01  0.00 - 0.07 K/uL Final   Performed at [facility], [address] [place], [place] [address]  . Sodium 01/09/2021 139  135 - 145 mmol/L Final  . Potassium 01/09/2021 3.8  3.5 - 5.1 mmol/L Final  . Chloride 01/09/2021 103  98 - 111 mmol/L Final  . CO2 01/09/2021 29  22 - 32 mmol/L  Final  . Glucose, Bld 01/09/2021 133* 70 - 99 mg/dL Final   Glucose reference range applies only to samples taken after fasting for at least 8 hours.  . BUN 01/09/2021 9  6 - 20 mg/dL Final  . Creatinine, Ser 01/09/2021 0.96  0.61 - 1.24 mg/dL Final  . Calcium 96/29/5284 9.2  8.9 - 10.3 mg/dL Final  . Total Protein 01/09/2021 6.0* 6.5 - 8.1 g/dL Final  . Albumin 13/24/4010 3.8  3.5 - 5.0 g/dL Final  . AST 27/25/3664 18  15 - 41 U/L Final  . ALT 01/09/2021 14  0 - 44 U/L Final  . Alkaline Phosphatase 01/09/2021 43  38 - 126 U/L Final  . Total Bilirubin 01/09/2021 0.6  0.3 - 1.2 mg/dL Final  . GFR, Estimated 01/09/2021 >60  >60 mL/min Final   Comment: (NOTE) Calculated using the CKD-EPI Creatinine Equation (2021)   . Anion gap 01/09/2021 7  5 - 15 Final   Performed at [facility], [address] [place], [place] [address]  Admission on 01/02/2021, Discharged on 01/02/2021  Component Date Value Ref Range Status  . SARS Coronavirus 2 by RT PCR 01/02/2021 NEGATIVE  NEGATIVE Final   Comment: (NOTE) SARS-CoV-2 target nucleic acids are NOT DETECTED.  The SARS-CoV-2 RNA is generally detectable in upper respiratory specimens during the acute phase of infection. The lowest concentration of SARS-CoV-2 viral copies this assay can detect is 138 copies/mL. A negative result does not preclude SARS-Cov-2 infection and should not be used as the sole basis for treatment or other patient management decisions. A negative result may occur with  improper specimen collection/handling, submission of specimen other than nasopharyngeal swab, presence of viral mutation(s) within the  areas targeted by this assay, and inadequate number of viral copies(<138 copies/mL). A negative result must be combined with clinical observations, patient history, and epidemiological information. The expected result is Negative.  Fact Sheet for Patients:  BloggerCourse.comhttps://www.fda.gov/media/152166/download  Fact Sheet for  Healthcare Providers:  SeriousBroker.ithttps://www.fda.gov/media/152162/download  This test is no                          t yet approved or cleared by the [place] FDA and  has been authorized for detection and/or diagnosis of SARS-CoV-2 by FDA under an Emergency Use Authorization (EUA). This EUA will remain  in effect (meaning this test can be used) for the duration of the COVID-19 declaration under Section 564(b)(1) of the Act, 21 U.S.C.section 360bbb-3(b)(1), unless the authorization is terminated  or revoked sooner.      . Influenza A by PCR 01/02/2021 NEGATIVE  NEGATIVE Final  . Influenza B by PCR 01/02/2021 NEGATIVE  NEGATIVE Final   Comment: (NOTE) The Xpert Xpress SARS-CoV-2/FLU/RSV plus assay is intended as an aid in the diagnosis of influenza from Nasopharyngeal swab specimens and should not be used as a sole basis for treatment. Nasal washings and aspirates are unacceptable for Xpert Xpress SARS-CoV-2/FLU/RSV testing.  Fact Sheet for Patients: BloggerCourse.comhttps://www.fda.gov/media/152166/download  Fact Sheet for Healthcare Providers: SeriousBroker.ithttps://www.fda.gov/media/152162/download  This test is not yet approved or cleared by the [place] FDA and has been authorized for detection and/or diagnosis of SARS-CoV-2 by FDA under an Emergency Use Authorization (EUA). This EUA will remain in effect (meaning this test can be used) for the duration of the COVID-19 declaration under Section 564(b)(1) of the Act, 21 U.S.C. section 360bbb-3(b)(1), unless the authorization is terminated or revoked.  Performed at [facility], [address] [place], [place] [id number]   . Sodium 01/02/2021 133* 135 - 145 mmol/L Final  . Potassium 01/02/2021 3.9  3.5 - 5.1 mmol/L Final  . Chloride 01/02/2021 102  98 - 111 mmol/L Final  . CO2 01/02/2021 21* 22 - 32 mmol/L Final  . Glucose, Bld 01/02/2021 111* 70 - 99 mg/dL Final   Glucose reference range applies only to samples taken after fasting  for at least 8 hours.  . BUN 01/02/2021 18  6 - 20 mg/dL Final  . Creatinine, Ser 01/02/2021 1.17  0.61 - 1.24 mg/dL Final  . Calcium 78/29/562103/27/2022 9.1  8.9 - 10.3 mg/dL Final  . Total Protein 01/02/2021 6.9  6.5 - 8.1 g/dL Final  . Albumin 30/86/578403/27/2022 4.2  3.5 - 5.0 g/dL Final  . AST 69/62/952803/27/2022 26  15 - 41 U/L Final  . ALT 01/02/2021 14  0 - 44 U/L Final  . Alkaline Phosphatase 01/02/2021 50  38 - 126 U/L Final  . Total Bilirubin 01/02/2021 0.9  0.3 - 1.2 mg/dL Final  . GFR, Estimated 01/02/2021 >60  >60 mL/min Final   Comment: (NOTE) Calculated using the CKD-EPI Creatinine Equation (2021)   . Anion gap 01/02/2021 10  5 - 15 Final   Performed at [facility], [address] [place], [place] [address]  . Alcohol, Ethyl (B) 01/02/2021 <10  <10 mg/dL Final   Comment: (NOTE) Lowest detectable limit for serum alcohol is 10 mg/dL.  For medical purposes only. Performed at [facility], [address] [place], [place] [address]   . WBC 01/02/2021 8.5  4.0 - 10.5 K/uL Final  . RBC 01/02/2021 4.85  4.22 - 5.81 MIL/uL Final  .  Hemoglobin 01/02/2021 15.2  13.0 - 17.0 g/dL Final  . HCT 25/36/6440 43.9  39.0 - 52.0 % Final  . MCV 01/02/2021 90.5  80.0 - 100.0 fL Final  . MCH 01/02/2021 31.3  26.0 - 34.0 pg Final  . MCHC 01/02/2021 34.6  30.0 - 36.0 g/dL Final  . RDW 34/74/2595 12.8  11.5 - 15.5 % Final  . Platelets 01/02/2021 265  150 - 400 K/uL Final  . nRBC 01/02/2021 0.0  0.0 - 0.2 % Final  . Neutrophils Relative % 01/02/2021 39  % Final  . Neutro Abs 01/02/2021 3.3  1.7 - 7.7 K/uL Final  . Lymphocytes Relative 01/02/2021 43  % Final  . Lymphs Abs 01/02/2021 3.7  0.7 - 4.0 K/uL Final  . Monocytes Relative 01/02/2021 7  % Final  . Monocytes Absolute 01/02/2021 0.6  0.1 - 1.0 K/uL Final  . Eosinophils Relative 01/02/2021 10  % Final  . Eosinophils Absolute 01/02/2021 0.8* 0.0 - 0.5 K/uL Final  . Basophils Relative 01/02/2021 0  % Final  . Basophils  Absolute 01/02/2021 0.0  0.0 - 0.1 K/uL Final  . Immature Granulocytes 01/02/2021 1  % Final  . Abs Immature Granulocytes 01/02/2021 0.04  0.00 - 0.07 K/uL Final   Performed at [facility], [address] [place], [place] [address]  Admission on 12/30/2020, Discharged on 12/31/2020  Component Date Value Ref Range Status  . Sodium 12/30/2020 138  135 - 145 mmol/L Final  . Potassium 12/30/2020 4.4  3.5 - 5.1 mmol/L Final  . Chloride 12/30/2020 101  98 - 111 mmol/L Final  . CO2 12/30/2020 28  22 - 32 mmol/L Final  . Glucose, Bld 12/30/2020 120* 70 - 99 mg/dL Final   Glucose reference range applies only to samples taken after fasting for at least 8 hours.  . BUN 12/30/2020 14  6 - 20 mg/dL Final  . Creatinine, Ser 12/30/2020 1.08  0.61 - 1.24 mg/dL Final  . Calcium 64/33/2951 9.6  8.9 - 10.3 mg/dL Final  . Total Protein 12/30/2020 6.6  6.5 - 8.1 g/dL Final  . Albumin 88/41/6606 4.1  3.5 - 5.0 g/dL Final  . AST 30/16/0109 22  15 - 41 U/L Final  . ALT 12/30/2020 17  0 - 44 U/L Final  . Alkaline Phosphatase 12/30/2020 43  38 - 126 U/L Final  . Total Bilirubin 12/30/2020 0.7  0.3 - 1.2 mg/dL Final  . GFR, Estimated 12/30/2020 >60  >60 mL/min Final   Comment: (NOTE) Calculated using the CKD-EPI Creatinine Equation (2021)   . Anion gap 12/30/2020 9  5 - 15 Final   Performed at [facility], [address] [place], [place] [address]  . Alcohol, Ethyl (B) 12/30/2020 <10  <10 mg/dL Final   Comment: (NOTE) Lowest detectable limit for serum alcohol is 10 mg/dL.  For medical purposes only. Performed at [facility], [address], [place], [place] [address]   . WBC 12/30/2020 6.4  4.0 - 10.5 K/uL Final  . RBC 12/30/2020 4.56  4.22 - 5.81 MIL/uL Final  . Hemoglobin 12/30/2020 14.4  13.0 - 17.0 g/dL Final  . HCT 25/42/7062 41.0  39.0 - 52.0 % Final  . MCV 12/30/2020 89.9  80.0 - 100.0 fL Final  . MCH 12/30/2020 31.6  26.0 - 34.0 pg Final  . MCHC 12/30/2020  35.1  30.0 - 36.0 g/dL Final  . RDW 37/62/8315 12.9  11.5 - 15.5 % Final  .  Platelets 12/30/2020 256  150 - 400 K/uL Final  . nRBC 12/30/2020 0.0  0.0 - 0.2 % Final   Performed at [facility], [address] [place], [place] [address]  . Opiates 12/30/2020 NONE DETECTED  NONE DETECTED Final  . Cocaine 12/30/2020 NONE DETECTED  NONE DETECTED Final  . Benzodiazepines 12/30/2020 NONE DETECTED  NONE DETECTED Final  . Amphetamines 12/30/2020 NONE DETECTED  NONE DETECTED Final  . Tetrahydrocannabinol 12/30/2020 NONE DETECTED  NONE DETECTED Final  . Barbiturates 12/30/2020 NONE DETECTED  NONE DETECTED Final   Comment: (NOTE) DRUG SCREEN FOR MEDICAL PURPOSES ONLY.  IF CONFIRMATION IS NEEDED FOR ANY PURPOSE, NOTIFY LAB WITHIN 5 DAYS.  LOWEST DETECTABLE LIMITS FOR URINE DRUG SCREEN Drug Class                     Cutoff (ng/mL) Amphetamine and metabolites    1000 Barbiturate and metabolites    200 Benzodiazepine                 200 Tricyclics and metabolites     300 Opiates and metabolites        300 Cocaine and metabolites        300 THC                            50 Performed at [facility], [address], [place], [place] [address]   . SARS Coronavirus 2 by RT PCR 12/31/2020 NEGATIVE  NEGATIVE Final   Comment: (NOTE) SARS-CoV-2 target nucleic acids are NOT DETECTED.  The SARS-CoV-2 RNA is generally detectable in upper respiratory specimens during the acute phase of infection. The lowest concentration of SARS-CoV-2 viral copies this assay can detect is 138 copies/mL. A negative result does not preclude SARS-Cov-2 infection and should not be used as the sole basis for treatment or other patient management decisions. A negative result may occur with  improper specimen collection/handling, submission of specimen other than nasopharyngeal swab, presence of viral mutation(s) within the areas targeted by this assay, and inadequate number of viral copies(<138 copies/mL). A  negative result must be combined with clinical observations, patient history, and epidemiological information. The expected result is Negative.  Fact Sheet for Patients:  BloggerCourse.com  Fact Sheet for Healthcare Providers:  SeriousBroker.it  This test is no                          t yet approved or cleared by the [place] FDA and  has been authorized for detection and/or diagnosis of SARS-CoV-2 by FDA under an Emergency Use Authorization (EUA). This EUA will remain  in effect (meaning this test can be used) for the duration of the COVID-19 declaration under Section 564(b)(1) of the Act, 21 U.S.C.section 360bbb-3(b)(1), unless the authorization is terminated  or revoked sooner.      . Influenza A by PCR 12/31/2020 NEGATIVE  NEGATIVE Final  . Influenza B by PCR 12/31/2020 NEGATIVE  NEGATIVE Final   Comment: (NOTE) The Xpert Xpress SARS-CoV-2/FLU/RSV plus assay is intended as an aid in the diagnosis of influenza from Nasopharyngeal swab specimens and should not be used as a sole basis for treatment. Nasal washings and aspirates are unacceptable for Xpert Xpress SARS-CoV-2/FLU/RSV testing.  Fact Sheet for Patients: BloggerCourse.com  Fact Sheet for Healthcare Providers: SeriousBroker.it  This test is not yet approved or cleared by the [place] FDA  and has been authorized for detection and/or diagnosis of SARS-CoV-2 by FDA under an Emergency Use Authorization (EUA). This EUA will remain in effect (meaning this test can be used) for the duration of the COVID-19 declaration under Section 564(b)(1) of the Act, 21 U.S.C. section 360bbb-3(b)(1), unless the authorization is terminated or revoked.  Performed at [facility], [address] [place], [place] [address]   Admission on 12/29/2020, Discharged on 12/30/2020  Component Date Value Ref Range Status  .  Sodium 12/29/2020 137  135 - 145 mmol/L Final  . Potassium 12/29/2020 4.4  3.5 - 5.1 mmol/L Final  . Chloride 12/29/2020 105  98 - 111 mmol/L Final  . CO2 12/29/2020 26  22 - 32 mmol/L Final  . Glucose, Bld 12/29/2020 99  70 - 99 mg/dL Final   Glucose reference range applies only to samples taken after fasting for at least 8 hours.  . BUN 12/29/2020 13  6 - 20 mg/dL Final  . Creatinine, Ser 12/29/2020 1.05  0.61 - 1.24 mg/dL Final  . Calcium 60/45/4098 9.3  8.9 - 10.3 mg/dL Final  . Total Protein 12/29/2020 6.4* 6.5 - 8.1 g/dL Final  . Albumin 11/91/4782 4.1  3.5 - 5.0 g/dL Final  . AST 95/62/1308 28  15 - 41 U/L Final  . ALT 12/29/2020 18  0 - 44 U/L Final  . Alkaline Phosphatase 12/29/2020 42  38 - 126 U/L Final  . Total Bilirubin 12/29/2020 1.0  0.3 - 1.2 mg/dL Final  . GFR, Estimated 12/29/2020 >60  >60 mL/min Final   Comment: (NOTE) Calculated using the CKD-EPI Creatinine Equation (2021)   . Anion gap 12/29/2020 6  5 - 15 Final   Performed at [facility], [address] [place], [place] [address]  . Alcohol, Ethyl (B) 12/29/2020 <10  <10 mg/dL Final   Comment: (NOTE) Lowest detectable limit for serum alcohol is 10 mg/dL.  For medical purposes only. Performed at [facility], [address], [place], [place] [address]   . WBC 12/29/2020 5.8  4.0 - 10.5 K/uL Final  . RBC 12/29/2020 4.49  4.22 - 5.81 MIL/uL Final  . Hemoglobin 12/29/2020 13.9  13.0 - 17.0 g/dL Final  . HCT 52/84/1324 40.9  39.0 - 52.0 % Final  . MCV 12/29/2020 91.1  80.0 - 100.0 fL Final  . MCH 12/29/2020 31.0  26.0 - 34.0 pg Final  . MCHC 12/29/2020 34.0  30.0 - 36.0 g/dL Final  . RDW 40/07/2724 12.7  11.5 - 15.5 % Final  . Platelets 12/29/2020 251  150 - 400 K/uL Final  . nRBC 12/29/2020 0.0  0.0 - 0.2 % Final   Performed at [facility], [address] [place], [place] [address]  . Opiates 12/29/2020 NONE DETECTED  NONE DETECTED Final  . Cocaine 12/29/2020 NONE DETECTED  NONE  DETECTED Final  . Benzodiazepines 12/29/2020 NONE DETECTED  NONE DETECTED Final  . Amphetamines 12/29/2020 NONE DETECTED  NONE DETECTED Final  . Tetrahydrocannabinol 12/29/2020 NONE DETECTED  NONE DETECTED Final  . Barbiturates 12/29/2020 NONE DETECTED  NONE DETECTED Final   Comment: (NOTE) DRUG SCREEN FOR MEDICAL PURPOSES ONLY.  IF CONFIRMATION IS NEEDED FOR ANY PURPOSE, NOTIFY LAB WITHIN 5 DAYS.  LOWEST DETECTABLE LIMITS FOR URINE DRUG SCREEN Drug Class                     Cutoff (ng/mL) Amphetamine and metabolites    1000 Barbiturate and  metabolites    200 Benzodiazepine                 200 Tricyclics and metabolites     300 Opiates and metabolites        300 Cocaine and metabolites        300 THC                            50 Performed at [facility], [address], [place], [place] [address]   Admission on 12/28/2020, Discharged on 12/28/2020  Component Date Value Ref Range Status  . SARS Coronavirus 2 by RT PCR 12/28/2020 NEGATIVE  NEGATIVE Final   Comment: (NOTE) SARS-CoV-2 target nucleic acids are NOT DETECTED.  The SARS-CoV-2 RNA is generally detectable in upper respiratory specimens during the acute phase of infection. The lowest concentration of SARS-CoV-2 viral copies this assay can detect is 138 copies/mL. A negative result does not preclude SARS-Cov-2 infection and should not be used as the sole basis for treatment or other patient management decisions. A negative result may occur with  improper specimen collection/handling, submission of specimen other than nasopharyngeal swab, presence of viral mutation(s) within the areas targeted by this assay, and inadequate number of viral copies(<138 copies/mL). A negative result must be combined with clinical observations, patient history, and epidemiological information. The expected result is Negative.  Fact Sheet for Patients:  BloggerCourse.com  Fact Sheet for Healthcare  Providers:  SeriousBroker.it  This test is no                          t yet approved or cleared by the [place] FDA and  has been authorized for detection and/or diagnosis of SARS-CoV-2 by FDA under an Emergency Use Authorization (EUA). This EUA will remain  in effect (meaning this test can be used) for the duration of the COVID-19 declaration under Section 564(b)(1) of the Act, 21 U.S.C.section 360bbb-3(b)(1), unless the authorization is terminated  or revoked sooner.      . Influenza A by PCR 12/28/2020 NEGATIVE  NEGATIVE Final  . Influenza B by PCR 12/28/2020 NEGATIVE  NEGATIVE Final   Comment: (NOTE) The Xpert Xpress SARS-CoV-2/FLU/RSV plus assay is intended as an aid in the diagnosis of influenza from Nasopharyngeal swab specimens and should not be used as a sole basis for treatment. Nasal washings and aspirates are unacceptable for Xpert Xpress SARS-CoV-2/FLU/RSV testing.  Fact Sheet for Patients: BloggerCourse.com  Fact Sheet for Healthcare Providers: SeriousBroker.it  This test is not yet approved or cleared by the [place] FDA and has been authorized for detection and/or diagnosis of SARS-CoV-2 by FDA under an Emergency Use Authorization (EUA). This EUA will remain in effect (meaning this test can be used) for the duration of the COVID-19 declaration under Section 564(b)(1) of the Act, 21 U.S.C. section 360bbb-3(b)(1), unless the authorization is terminated or revoked.  Performed at [facility], [address], [place], [place] [address]   . WBC 12/28/2020 7.1  4.0 - 10.5 K/uL Final  . RBC 12/28/2020 4.94  4.22 - 5.81 MIL/uL Final  . Hemoglobin 12/28/2020 15.1  13.0 - 17.0 g/dL Final  . HCT 82/95/6213 44.0  39.0 - 52.0 % Final  . MCV 12/28/2020 89.1  80.0 - 100.0 fL Final  . MCH 12/28/2020 30.6  26.0 - 34.0 pg Final  . MCHC 12/28/2020 34.3  30.0 - 36.0 g/dL  Final  . RDW  12/28/2020 12.8  11.5 - 15.5 % Final  . Platelets 12/28/2020 250  150 - 400 K/uL Final  . nRBC 12/28/2020 0.0  0.0 - 0.2 % Final  . Neutrophils Relative % 12/28/2020 38  % Final  . Neutro Abs 12/28/2020 2.7  1.7 - 7.7 K/uL Final  . Lymphocytes Relative 12/28/2020 45  % Final  . Lymphs Abs 12/28/2020 3.2  0.7 - 4.0 K/uL Final  . Monocytes Relative 12/28/2020 7  % Final  . Monocytes Absolute 12/28/2020 0.5  0.1 - 1.0 K/uL Final  . Eosinophils Relative 12/28/2020 9  % Final  . Eosinophils Absolute 12/28/2020 0.6* 0.0 - 0.5 K/uL Final  . Basophils Relative 12/28/2020 1  % Final  . Basophils Absolute 12/28/2020 0.1  0.0 - 0.1 K/uL Final  . Immature Granulocytes 12/28/2020 0  % Final  . Abs Immature Granulocytes 12/28/2020 0.03  0.00 - 0.07 K/uL Final   Performed at [facility], [address] [place], [place] [address]  . Sodium 12/28/2020 138  135 - 145 mmol/L Final  . Potassium 12/28/2020 3.8  3.5 - 5.1 mmol/L Final  . Chloride 12/28/2020 102  98 - 111 mmol/L Final  . CO2 12/28/2020 27  22 - 32 mmol/L Final  . Glucose, Bld 12/28/2020 105* 70 - 99 mg/dL Final   Glucose reference range applies only to samples taken after fasting for at least 8 hours.  . BUN 12/28/2020 17  6 - 20 mg/dL Final  . Creatinine, Ser 12/28/2020 1.03  0.61 - 1.24 mg/dL Final  . Calcium 60/45/4098 9.6  8.9 - 10.3 mg/dL Final  . Total Protein 12/28/2020 6.9  6.5 - 8.1 g/dL Final  . Albumin 11/91/4782 4.4  3.5 - 5.0 g/dL Final  . AST 95/62/1308 26  15 - 41 U/L Final  . ALT 12/28/2020 25  0 - 44 U/L Final  . Alkaline Phosphatase 12/28/2020 46  38 - 126 U/L Final  . Total Bilirubin 12/28/2020 0.7  0.3 - 1.2 mg/dL Final  . GFR, Estimated 12/28/2020 >60  >60 mL/min Final   Comment: (NOTE) Calculated using the CKD-EPI Creatinine Equation (2021)   . Anion gap 12/28/2020 9  5 - 15 Final   Performed at [facility], [address], [place], [place] [address]  . Hgb A1c MFr Bld 12/28/2020 5.5  4.8 - 5.6 %  Final   Comment: (NOTE) Pre diabetes:          5.7%-6.4%  Diabetes:              >6.4%  Glycemic control for   <7.0% adults with diabetes   . Mean Plasma Glucose 12/28/2020 111.15  mg/dL Final   Performed at [facility], [address] [place], [place] [address]  . TSH 12/28/2020 2.629  0.350 - 4.500 uIU/mL Final   Comment: Performed by a 3rd Generation assay with a functional sensitivity of <=0.01 uIU/mL. Performed at [facility], [address], [place], [place] [address]   . POC Amphetamine UR 12/28/2020 None Detected  NONE DETECTED (Cut Off Level 1000 ng/mL) Final  . POC Secobarbital (BAR) 12/28/2020 None Detected  NONE DETECTED (Cut Off Level 300 ng/mL) Final  . POC Buprenorphine (BUP) 12/28/2020 None Detected  NONE DETECTED (Cut Off Level 10 ng/mL) Final  . POC Oxazepam (BZO) 12/28/2020 None Detected  NONE DETECTED (Cut Off Level 300 ng/mL) Final  . POC Cocaine UR 12/28/2020 None Detected  NONE DETECTED (Cut Off Level 300 ng/mL) Final  . POC Methamphetamine UR 12/28/2020 None Detected  NONE DETECTED (Cut Off Level 1000 ng/mL) Final  . POC Morphine 12/28/2020 None Detected  NONE DETECTED (Cut Off Level 300 ng/mL) Final  . POC Oxycodone UR 12/28/2020 None Detected  NONE DETECTED (Cut Off Level 100 ng/mL) Final  . POC Methadone UR 12/28/2020 None Detected  NONE DETECTED (Cut Off Level 300 ng/mL) Final  . POC Marijuana UR 12/28/2020 Positive* NONE DETECTED (Cut Off Level 50 ng/mL) Final  . SARS Coronavirus 2 Ag 12/28/2020 NEGATIVE  NEGATIVE Final   Comment: (NOTE) SARS-CoV-2 antigen NOT DETECTED.   Negative results are presumptive.  Negative results do not preclude SARS-CoV-2 infection and should not be used as the sole basis for treatment or other patient management decisions, including infection  control decisions, particularly in the presence of clinical signs and  symptoms consistent with COVID-19, or in those who have been in contact with the virus.  Negative  results must be combined with clinical observations, patient history, and epidemiological information. The expected result is Negative.  Fact Sheet for Patients: https://www.jennings-kim.com/  Fact Sheet for Healthcare Providers: https://alexander-rogers.biz/  This test is not yet approved or cleared by the [place] FDA and  has been authorized for detection and/or diagnosis of SARS-CoV-2 by FDA under an Emergency Use Authorization (EUA).  This EUA will remain in effect (meaning this test can be used) for the duration of  the COV                          ID-19 declaration under Section 564(b)(1) of the Act, 21 U.S.C. section 360bbb-3(b)(1), unless the authorization is terminated or revoked sooner.    . Cholesterol 12/28/2020 188  0 - 200 mg/dL Final  . Triglycerides 12/28/2020 69  <150 mg/dL Final  . HDL 16/07/9603 77  >40 mg/dL Final  . Total CHOL/HDL Ratio 12/28/2020 2.4  RATIO Final  . VLDL 12/28/2020 14  0 - 40 mg/dL Final  . LDL Cholesterol 12/28/2020 97  0 - 99 mg/dL Final   Comment:        Total Cholesterol/HDL:CHD Risk Coronary Heart Disease Risk Table                     Men   Women  1/2 Average Risk   3.4   3.3  Average Risk       5.0   4.4  2 X Average Risk   9.6   7.1  3 X Average Risk  23.4   11.0        Use the calculated Patient Ratio above and the CHD Risk Table to determine the patient's CHD Risk.        ATP III CLASSIFICATION (LDL):  <100     mg/dL   Optimal  540-981  mg/dL   Near or Above                    Optimal  130-159  mg/dL   Borderline  191-478  mg/dL   High  >295     mg/dL   Very High Performed at [facility], [address], [place], [place] [address]   There may be more visits with results that are not included.    Allergies: Shellfish allergy and Bee pollen  PTA Medications: (Not in a hospital admission)   Medical Decision Making  Patient admitted to  overnight observation for medication  management and mood stabilization. Labs ordered: Covid, CMP, CBC, Lipid, TSH, UDS, ETOH, EKG  .     Recommendations  Based on my evaluation the patient does not appear to have an emergency medical condition.  Layla Barter, NP 03/01/21  6:56 AM

## 2021-03-01 NOTE — ED Notes (Addendum)
Pt admitted to continuous assessment due to paranoia. Pt alert; anxious but cooperative. Pt answers "I don't know" to most questions. Pt denies SI/HI/AVH and states he is "scared." Pt tolerated lab work and skin assessment well. Pt ambulated independently to unit. Oriented to unit/staff. Pt was back and forth to bathroom several times while in assessment room then again once he got on unit. No signs of acute distress noted. Wheezing is less pronounced and pt states he "feels better." Will continue to monitor for safety.

## 2021-03-01 NOTE — ED Notes (Addendum)
Pt has audible wheezes in assessment room and states he "doesn't feel good and can't breathe." Pt points at his stomach when he says he isn't feeling good. [name], NP and Wyvonnia Dusky, RN/[facility] notified.

## 2021-03-01 NOTE — Discharge Instructions (Signed)

## 2021-03-01 NOTE — ED Provider Notes (Signed)
FBC/OBS ASAP Discharge Summary  Date and Time: 03/01/2021 8:44 AM  Name: [name]  MRN:  [id number]   Discharge Diagnoses:  Final diagnoses:  Paranoid schizophrenia (HCC)    Subjective:  Patient seen and chart reviewed. Patient is familiar to me from previous presentations. Patient has baseline paranoia and often presents with vague paranoid symptoms. I spoke with patient's ACT team the last time he was at the [facility] who confirmed patient's baseline paranoia but reported that he had been refusing LAI this month(invega sustenna 156mg ; last received in April). Pt declined LAI last presentation to [facility] 5/20; was due approximately 5/8. Patient reported readiness to receive LAI to provider earlier this morning. On interview today patient continues to report his readiness to receive LAI: states that his only concern with LAI is the pain at injection site that is associated with it. Pt denies SI/HI/AVH. He expresses paranoia and states he is "scared for my life" which is his baseline. He provides consent for me to call ACT team. [facility] ACT team; [phone]- [phone], ACT team lead. ACT team was called at 8:44 AM; I spoke to Wendelyn Breslow who stated that ACT team nurse would come to the [facility] after their morning meeting and administer LAI and pick him up. Patient was informed of this plan and he was agreeable.  Stay Summary:    [name] is a 30 y.o. male who presents voluntarily as a walk-in unaccompanied to the [facility].The patient has asignificant pastpsychiatric history of paranoid schizophreniaand [facility].   Patient states, "I am scared for my life" He is unable to explain why he is scared for his life. He is unable to identify triggers, stressors, or events attributing to why he is afraid. He answers the assessment questions with "I don't know"responses.   On approach, he is alert and oriented x 3.His  speech is coherent, tone is at a normal rate and his thought process is somewhat logical when he does answer questions. He is calm and cooperative during the assessment. He is dressed casually in pants and a t-shirt. He denies suicidal and homicidal ideations. He denies auditory and visual hallucinations. He does not appear to be responding to internal, or external stimuli. He denies feeling paranoid, however hepresents with paranoid thoughts that are chronic in nature. He denies using illicit drugs, including THC, heroin, or cocaine. He denies alcohol use.Hedenies having an ACTteam. He denies taking his Invega injection and states that he doesn't like it because it causes his arm to hurt.   He denies that he is a threat to himself, or others. Therefore, we discussed him staying overnight at the [facility] for medication management. He agreed to receive the long-acting Invega injection while here at the [facility]. Patient verbalizes understanding.   Per chart review: Patient received Invega Sustenna 156 mg IM (every 28 days) on 01/16/21 at [facility].  Patient admitted for overnight observation. The following day patient remained agreeable to receive LAI; received today 5/34 prior to discharge-administered by ACT team member  Total Time spent with patient: 20 minutes  Past Psychiatric History: schizophrenia Past Medical History:  Past Medical History:  Diagnosis Date  . Depression   . Schizophrenia [facility])     Past Surgical History:  Procedure Laterality Date  . BACK SURGERY     Family History: No family history on file. Family Psychiatric History: see H&P Social History:  Social History   Substance and Sexual Activity  Alcohol Use  No     Social History   Substance and Sexual Activity  Drug Use Yes  . Types: Marijuana   Comment: rare    Social History   Socioeconomic History  . Marital status: Single    Spouse name: Not on file  . Number of children: Not on file  . Years of education: Not  on file  . Highest education level: Not on file  Occupational History  . Occupation: unemployed  Tobacco Use  . Smoking status: Current Some Day Smoker    Types: Cigarettes  . Smokeless tobacco: Never Used  Vaping Use  . Vaping Use: Never used  Substance and Sexual Activity  . Alcohol use: No  . Drug use: Yes    Types: Marijuana    Comment: rare  . Sexual activity: Yes    Birth control/protection: Condom  Other Topics Concern  . Not on file  Social History Narrative   ** Merged History Encounter **       ** Merged History Encounter **       UTA certain topics - tangential thought process, cannot answer clearly. Pt not sure about school; reportedly is homeless.    Social Determinants of Health   Financial Resource Strain: Not on file  Food Insecurity: Not on file  Transportation Needs: Not on file  Physical Activity: Not on file  Stress: Not on file  Social Connections: Not on file   SDOH:  SDOH Screenings   Alcohol Screen: Not on file  Depression (PHQ2-9): Medium Risk  . PHQ-2 Score: 8  Financial Resource Strain: Not on file  Food Insecurity: Not on file  Housing: Not on file  Physical Activity: Not on file  Social Connections: Not on file  Stress: Not on file  Tobacco Use: High Risk  . Smoking Tobacco Use: Current Some Day Smoker  . Smokeless Tobacco Use: Never Used  Transportation Needs: Not on file    Has this patient used any form of tobacco in the last 30 days? (Cigarettes, Smokeless Tobacco, Cigars, and/or Pipes) Prescription not provided because: n/a  Current Medications:  Current Facility-Administered Medications  Medication Dose Route Frequency Provider Last Rate Last Admin  . acetaminophen (TYLENOL) tablet 650 mg  650 mg Oral Q6H PRN White, Patrice L, NP      . albuterol (VENTOLIN HFA) 108 (90 Base) MCG/ACT inhaler 1-2 puff  1-2 puff Inhalation Q6H PRN White, Patrice L, NP   2 puff at 03/01/21 0504  . alum & mag hydroxide-simeth (MAALOX/MYLANTA)  200-200-20 MG/5ML suspension 30 mL  30 mL Oral Q4H PRN White, Patrice L, NP      . magnesium hydroxide (MILK OF MAGNESIA) suspension 30 mL  30 mL Oral Daily PRN White, Patrice L, NP       No current outpatient medications on file.    PTA Medications: (Not in a hospital admission)   Musculoskeletal  Strength & Muscle Tone: within normal limits Gait & Station: normal Patient leans: N/A  Psychiatric Specialty Exam  Presentation  General Appearance: Appropriate for Environment; Casual  Eye Contact:Fair  Speech:Clear and Coherent  Speech Volume:Decreased  Handedness:Right   Mood and Affect  Mood:Euthymic  Affect:Blunt   Thought Process  Thought Processes:Linear  Descriptions of Associations:Intact  Orientation:Full (Time, Place and Person)  Thought Content:Paranoid Ideation (vague paranoia; "scared for my life"; unable to clarify further)  Diagnosis of Schizophrenia or Schizoaffective disorder in past: Yes  Duration of Psychotic Symptoms: Greater than six months   Hallucinations:Hallucinations: None Description  of Auditory Hallucinations: Someone is after me  Ideas of Reference:Paranoia (vague paranoia; "scared for my life")  Suicidal Thoughts:Suicidal Thoughts: No  Homicidal Thoughts:Homicidal Thoughts: No   Sensorium  Memory:Immediate Fair; Recent Poor; Remote Poor  Judgment:Intact  Insight:Fair (recognizes that LAI , may be helpful to control sx and prevent continued hospital presentations)   Executive Functions  Concentration:Poor  Attention Span:Fair  Recall:Fair  Fund of Knowledge:Fair  Language:Fair   Psychomotor Activity  Psychomotor Activity:Psychomotor Activity: Normal   Assets  Assets:Communication Skills; Desire for Improvement; Housing; Physical Health; Resilience   Sleep  Sleep:Sleep: Fair    No data recorded  Physical Exam  Physical Exam Constitutional:      Appearance: Normal appearance. He is normal weight.   HENT:     Head: Normocephalic and atraumatic.  Eyes:     Extraocular Movements: Extraocular movements intact.  Pulmonary:     Effort: Pulmonary effort is normal.  Neurological:     General: No focal deficit present.     Mental Status: He is alert.  Psychiatric:        Attention and Perception: Attention and perception normal.        Speech: Speech normal.        Behavior: Behavior is cooperative.    Review of Systems  Constitutional: Negative for chills and fever.  HENT: Negative for hearing loss.   Eyes: Negative for discharge and redness.  Respiratory: Negative for cough.   Cardiovascular: Negative for chest pain.  Gastrointestinal: Negative for abdominal pain.  Musculoskeletal: Negative for myalgias.  Neurological: Negative for headaches.   Blood pressure 118/88, pulse 80, temperature 98 F (36.7 C), temperature source Oral, resp. rate 16, SpO2 100 %. There is no height or weight on file to calculate BMI.  Demographic Factors:  Male, Low socioeconomic status and Living alone  Loss Factors: NA  Historical Factors: NA  Risk Reduction Factors:   Positive therapeutic relationship and Positive coping skills or problem solving skills  Continued Clinical Symptoms:  Patient has paranoia at baseline; currently at baseline  Cognitive Features That Contribute To Risk:  Thought constriction (tunnel vision)    Suicide Risk:  Minimal: No identifiable suicidal ideation.  Patients presenting with no risk factors but with morbid ruminations; may be classified as minimal risk based on the severity of the depressive symptoms  Plan Of Care/Follow-up recommendations:  Activity:  as tolerated Diet:  regular Other:     Patient is instructed prior to discharge to: Take all medications as prescribed by his/her mental healthcare provider. Report any adverse effects and or reactions from the medicines to his/her outpatient provider promptly. Patient has been instructed & cautioned:  To not engage in alcohol and or illegal drug use while on prescription medicines. In the event of worsening symptoms, patient is instructed to call the crisis hotline, 911 and or go to the nearest ED for appropriate evaluation and treatment of symptoms. To follow-up with his/her primary care provider for your other medical issues, concerns and or health care needs.   Recommended to patient that he continue to follow up with his ACT team  Disposition:transported home by ACT team member  Estella Husk, MD 03/01/2021, 8:44 AM

## 2021-03-03 ENCOUNTER — Emergency Department (HOSPITAL_COMMUNITY)
Admission: EM | Admit: 2021-03-03 | Discharge: 2021-03-03 | Disposition: A | Discharge status: Home / Self Care | Payer: Medicaid - Out of State | Attending: Emergency Medicine | Admitting: Emergency Medicine

## 2021-03-03 ENCOUNTER — Other Ambulatory Visit: Payer: Self-pay

## 2021-03-03 ENCOUNTER — Encounter (HOSPITAL_COMMUNITY): Payer: Self-pay

## 2021-03-03 DIAGNOSIS — R41840 Attention and concentration deficit: Secondary | ICD-10-CM | POA: Diagnosis not present

## 2021-03-03 DIAGNOSIS — F17210 Nicotine dependence, cigarettes, uncomplicated: Secondary | ICD-10-CM | POA: Diagnosis not present

## 2021-03-03 DIAGNOSIS — M545 Low back pain, unspecified: Secondary | ICD-10-CM | POA: Insufficient documentation

## 2021-03-03 DIAGNOSIS — M549 Dorsalgia, unspecified: Secondary | ICD-10-CM | POA: Diagnosis present

## 2021-03-03 NOTE — ED Triage Notes (Signed)
BIB by EMS. Pt is homeless. Pt stating he needs medication to move and he has back pain.

## 2021-03-03 NOTE — ED Provider Notes (Signed)
[facility]-EMERGENCY DEPT Provider Note   CSN: [id number] Arrival date & time: 03/03/21  3009     History Chief Complaint  Patient presents with  . Back Pain    [name] is a 30 y.o. male who presents for evaluation of requesting medication as well as back pain.  Patient states that he came today because he "needs a pill to help him relax and focus."  He states that he has had difficulty focusing and would like to concentrate better.  He states he has never been on any medication for this.  Patient states that he is homeless right now and lives on the streets.  He walks around a lot looking for some place to stay.  He states that now his back hurts.  He states he has not fallen.  He states his back hurts more when he bends, moves.  He can move his arms and legs and feel his arms and legs.  He denies any drug use, alcohol use.  Patient denies any SI, HI, auditory or visual hallucinations. Denies fevers, numbness/weakness of upper and lower extremities, bowel/bladder incontinence, history of back surgery, history of IVDA.    The history is provided by the patient.       Past Medical History:  Diagnosis Date  . Depression   . Schizophrenia [facility])     Patient Active Problem List   Diagnosis Date Noted  . 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 Schizophrenia, paranoid type (HCC) 10/24/2020  . Paranoid schizophrenia (HCC) 01/05/2020  . Other schizophrenia (HCC) 02/14/2018  . Cannabis use disorder, moderate, dependence (HCC) 05/10/2016    Past Surgical History:  Procedure Laterality Date  . BACK SURGERY         History reviewed. No pertinent family history.  Social History   Tobacco Use  . Smoking status: Current Some Day Smoker    Types: Cigarettes  . Smokeless tobacco: Never Used  Vaping Use  . Vaping Use: Never used  Substance Use Topics  . Alcohol use: No  . Drug use: Yes    Types: Marijuana    Comment: rare    Home  Medications Prior to Admission medications   Medication Sig Start Date End Date Taking? Authorizing Provider  paliperidone (INVEGA) 6 MG 24 hr tablet Take 1 tablet (6 mg total) by mouth daily for 14 days. Patient not taking: No sig reported 01/02/21 02/22/21  Mare Ferrari, PA-C    Allergies    Shellfish allergy and Bee pollen  Review of Systems   Review of Systems  Constitutional: Negative for fever.  Musculoskeletal: Positive for back pain. Negative for neck pain.  All other systems reviewed and are negative.   Physical Exam Updated Vital Signs BP 133/85 (BP Location: Left Arm)   Pulse 63   Temp 98.3 F (36.8 C) (Oral)   Resp 17   Ht 5\' 5"  (1.651 m)   Wt 74.8 kg   SpO2 97%   BMI 27.46 kg/m   Physical Exam Vitals and nursing note reviewed.  Constitutional:      Appearance: He is well-developed.  HENT:     Head: Normocephalic and atraumatic.  Eyes:     General: No scleral icterus.       Right eye: No discharge.        Left eye: No discharge.     Conjunctiva/sclera: Conjunctivae normal.  Neck:     Comments: Full flexion/extension and lateral movement of neck fully intact. No bony midline  tenderness. No deformities or crepitus.  Pulmonary:     Effort: Pulmonary effort is normal.  Musculoskeletal:     Comments: No midline T spine tenderness.  No former step-off noted.  Diffuse tenderness palpation lower lumbar region.  No overlying warmth, erythema, edema.  Skin:    General: Skin is warm and dry.  Neurological:     Mental Status: He is alert.     Comments: 5/5 strength BUE and BLE Sensation intact along major nerve distributions of BUE and BLE  Psychiatric:        Speech: Speech normal.        Behavior: Behavior normal.     Comments: Clear speech.  No rapid pressured speech.  He is able to converse without any difficulty.  Normal behavior.  No signs of agitation.  He makes good eye contact.  No SI, HI.     ED Results / Procedures / Treatments   Labs (all  labs ordered are listed, but only abnormal results are displayed) Labs Reviewed - No data to display  EKG None  Radiology No results found.  Procedures Procedures   Medications Ordered in ED Medications - No data to display  ED Course  I have reviewed the triage vital signs and the nursing notes.  Pertinent labs & imaging results that were available during my care of the patient were reviewed by me and considered in my medical decision making (see chart for details).    MDM Rules/Calculators/A&P                          30 year old male who presents for evaluation of "wanting medication to focus and concentrate."  He states that he has not been on this medication previously but he feels like he needs it.  He states he is homeless.  He also reports back pain.  No fall.  He states he has been walking around a lot.  Back pain is worse when he moves.  Denies any history of IV drug use.  No numbness/weakness of arms or legs.  On initial arrival, he is afebrile, nontoxic-appearing.  Vital signs are stable.  On exam, he is moving all extremities with normal strength.  He denies any SI, HI.  No rapid or pressured speech.  No auditory visual hallucinations.  Does not appear to be responding to internal stimuli.  Patient with no risk factors, red flag symptoms.  He is moving all extremities and is overall well-appearing.  Denies any history of trauma, fall.  No indication that he needs x-ray imaging at this time.  Additionally, patient with no SI, HI, auditory or visual hallucinations.  I discussed with him that starting medication to help him focus and concentrate is not in the scope of the emergency department.  We will give him follow-up with behavioral health urgent care and suggest that he follow-up with outpatient resources. At this time, patient exhibits no emergent life-threatening condition that require further evaluation in ED. Patient had ample opportunity for questions and discussion. All  patient's questions were answered with full understanding. Strict return precautions discussed. Patient expresses understanding and agreement to plan.   Portions of this note were generated with Scientist, clinical (histocompatibility and immunogenetics). Dictation errors may occur despite best attempts at proofreading.   Final Clinical Impression(s) / ED Diagnoses Final diagnoses:  Acute bilateral low back pain, unspecified whether sciatica present  Difficulty concentrating    Rx / DC Orders ED Discharge Orders  None       Rosana Hoes 03/03/21 [id number]    Sabas Sous, MD 03/03/21 [phone]

## 2021-03-03 NOTE — Discharge Instructions (Signed)
It is important that you follow-up with the behavioral health urgent care for further management.  Return to emergency department for any worsening pain or any other worsening concerning symptoms.

## 2021-03-04 ENCOUNTER — Ambulatory Visit (HOSPITAL_COMMUNITY)
Admission: EM | Admit: 2021-03-04 | Discharge: 2021-03-04 | Disposition: A | Discharge status: Home / Self Care | Payer: Medicaid Other | Attending: Nurse Practitioner | Admitting: Nurse Practitioner

## 2021-03-04 DIAGNOSIS — F200 Paranoid schizophrenia: Secondary | ICD-10-CM | POA: Insufficient documentation

## 2021-03-04 NOTE — Discharge Instructions (Addendum)
   Discharge recommendations:  Patient is to take medications as prescribed. Please see information for follow-up appointment with psychiatry and therapy. Please follow up with your primary care provider for all medical related needs.   Therapy: We recommend that patient participate in individual therapy to address mental health concerns.   Atypical antipsychotics: If you are prescribed an atypical antipsychotic, it is recommended that your height, weight, BMI, blood pressure, fasting lipid panel, and fasting blood sugar be monitored by your outpatient providers.  Safety:  The patient should abstain from use of illicit substances/drugs and abuse of any medications. If symptoms worsen or do not continue to improve or if the patient becomes actively suicidal or homicidal then it is recommended that the patient return to the closest hospital emergency department, the [facility], or call 911 for further evaluation and treatment. National Suicide Prevention Lifeline 1-800-SUICIDE or 1-800-273-8255.  

## 2021-03-04 NOTE — BH Assessment (Signed)
Patient reports with [employer] reporting that he needs a Xanax. Patient denies SI/ HI/ AVH or any substance use. Patient seen by provider and discharged to home.

## 2021-03-04 NOTE — ED Provider Notes (Signed)
Behavioral Health Urgent Care Medical Screening Exam  Patient Name: [name] MRN: [id number] Date of Evaluation: 03/04/21 Chief Complaint:   Diagnosis:  Final diagnoses:  Paranoid schizophrenia (HCC)    History of Present illness: [name] is a 30 y.o. male with a history of paranoid schizophrenia who presents voluntarily to [facility] with law enforcement  Patient states that he is hear because he needs a "xanax." He deos not elaborate on why he feels he needs a xanax. On evaluation patient is alert and oriented x 4, pleasant, and cooperative. Speech is clear and coherent. Mood is depressed and affect is congruent with mood. Thought process is coherent and thought content is logical. Denies auditory and visual hallucinations. No indication that patient is responding to internal stimuli. Denies suicidal ideations. Denies homicidal ideations. Denies substance abuse. Patient appears to be at baseline. Per chart review, his ACT team administered his LAI at [facility] on 03/01/2021.    Psychiatric Specialty Exam  Presentation  General Appearance:Appropriate for Environment; Neat  Eye Contact:Fair  Speech:Clear and Coherent; Pressured  Speech Volume:Decreased  Handedness:Right   Mood and Affect  Mood:Euthymic  Affect:Congruent   Thought Process  Thought Processes:Coherent  Descriptions of Associations:Intact  Orientation:Full (Time, Place and Person)  Thought Content:Paranoid Ideation  Diagnosis of Schizophrenia or Schizoaffective disorder in past: Yes  Duration of Psychotic Symptoms: Greater than six months  Hallucinations:None Someone is after me  Ideas of Reference:None  Suicidal Thoughts:No  Homicidal Thoughts:No   Sensorium  Memory:Immediate Fair; Recent Fair; Remote Fair  Judgment:Intact  Insight:Fair   Executive Functions  Concentration:Fair  Attention Span:Fair  Recall:Fair  Fund of Knowledge:Fair  Language:Fair   Psychomotor Activity   Psychomotor Activity:Normal   Assets  Assets:Desire for Improvement; Financial Resources/Insurance; Physical Health; Housing   Sleep  Sleep:Fair  Number of hours: -1   No data recorded  Physical Exam: Physical Exam Constitutional:      General: He is not in acute distress.    Appearance: He is not ill-appearing, toxic-appearing or diaphoretic.  HENT:     Head: Normocephalic.     Right Ear: External ear normal.     Left Ear: External ear normal.  Cardiovascular:     Rate and Rhythm: Normal rate.  Pulmonary:     Effort: Pulmonary effort is normal. No respiratory distress.  Musculoskeletal:        General: Normal range of motion.  Neurological:     Mental Status: He is alert and oriented to person, place, and time.  Psychiatric:        Mood and Affect: Mood is not anxious or depressed.        Speech: Speech normal.        Behavior: Behavior is cooperative.        Thought Content: Thought content is paranoid. Thought content is not delusional. Thought content does not include homicidal or suicidal ideation. Thought content does not include suicidal plan.    Review of Systems  Constitutional: Negative for chills, diaphoresis, fever, malaise/fatigue and weight loss.  HENT: Negative for congestion.   Respiratory: Negative for cough and shortness of breath.   Cardiovascular: Negative for chest pain and palpitations.  Gastrointestinal: Negative for diarrhea, nausea and vomiting.  Psychiatric/Behavioral: Negative for depression, hallucinations, memory loss, substance abuse and suicidal ideas. The patient has insomnia. The patient is not nervous/anxious.   All other systems reviewed and are negative.  Blood pressure 112/78, pulse 62, temperature 97.7 F (36.5 C), temperature source Oral, resp. rate 16, SpO2  98 %. There is no height or weight on file to calculate BMI.  Musculoskeletal: Strength & Muscle Tone: within normal limits Gait & Station: normal Patient leans:  N/A   [facility] MSE Discharge Disposition for Follow up and Recommendations: Based on my evaluation the patient does not appear to have an emergency medical condition and can be discharged with resources and follow up care in outpatient services for Medication Management and Individual Therapy    Follow up with ACT Team  Jackelyn Poling, NP 03/04/2021, 1:17 AM

## 2021-03-05 ENCOUNTER — Emergency Department (HOSPITAL_COMMUNITY)
Admission: EM | Admit: 2021-03-05 | Discharge: 2021-03-05 | Disposition: A | Discharge status: Home / Self Care | Payer: Medicaid Other | Attending: Emergency Medicine | Admitting: Emergency Medicine

## 2021-03-05 ENCOUNTER — Encounter (HOSPITAL_COMMUNITY): Payer: Self-pay | Admitting: Emergency Medicine

## 2021-03-05 DIAGNOSIS — F17210 Nicotine dependence, cigarettes, uncomplicated: Secondary | ICD-10-CM | POA: Insufficient documentation

## 2021-03-05 DIAGNOSIS — F209 Schizophrenia, unspecified: Secondary | ICD-10-CM | POA: Diagnosis not present

## 2021-03-05 DIAGNOSIS — R0602 Shortness of breath: Secondary | ICD-10-CM | POA: Diagnosis present

## 2021-03-05 NOTE — ED Triage Notes (Signed)
Pt states he has SOB and cold sweats. Pt got duo neb via EMS for noted wheezes.

## 2021-03-05 NOTE — ED Provider Notes (Signed)
[facility]-EMERGENCY DEPT Provider Note   CSN: [id number] Arrival date & time: 03/05/21  7673     History Chief Complaint  Patient presents with  . Shortness of Breath    [name] is a 30 y.o. male.   Shortness of Breath Severity:  Mild Onset quality:  Gradual Timing:  Constant Progression:  Unchanged Chronicity:  Recurrent Context: not smoke exposure   Relieved by:  Nothing Worsened by:  Nothing Ineffective treatments:  None tried Associated symptoms: no abdominal pain, no chest pain, no claudication, no cough, no diaphoresis, no ear pain, no fever, no headaches, no hemoptysis, no neck pain, no PND, no rash, no sore throat, no sputum production, no syncope, no swollen glands, no vomiting and no wheezing   Risk factors: no hx of PE/DVT   Patient well known to the ED is really here because "I want xanax to help me move."  No CP, no DOE, no n/v/d.  No recent travel no leg pain.      Past Medical History:  Diagnosis Date  . Depression   . Schizophrenia [facility])     Patient Active Problem List   Diagnosis Date Noted  . 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 Schizophrenia, paranoid type (HCC) 10/24/2020  . Paranoid schizophrenia (HCC) 01/05/2020  . Other schizophrenia (HCC) 02/14/2018  . Cannabis use disorder, moderate, dependence (HCC) 05/10/2016    Past Surgical History:  Procedure Laterality Date  . BACK SURGERY         History reviewed. No pertinent family history.  Social History   Tobacco Use  . Smoking status: Current Some Day Smoker    Types: Cigarettes  . Smokeless tobacco: Never Used  Vaping Use  . Vaping Use: Never used  Substance Use Topics  . Alcohol use: No  . Drug use: Yes    Types: Marijuana    Comment: rare    Home Medications Prior to Admission medications   Medication Sig Start Date End Date Taking? Authorizing Provider  paliperidone (INVEGA) 6 MG 24 hr tablet Take 1 tablet (6 mg total) by mouth  daily for 14 days. Patient not taking: No sig reported 01/02/21 02/22/21  Mare Ferrari, PA-C    Allergies    Shellfish allergy and Bee pollen  Review of Systems   Review of Systems  Constitutional: Negative for diaphoresis and fever.  HENT: Negative for ear pain and sore throat.   Eyes: Negative for visual disturbance.  Respiratory: Positive for shortness of breath. Negative for cough, hemoptysis, sputum production and wheezing.   Cardiovascular: Negative for chest pain, claudication, syncope and PND.  Gastrointestinal: Negative for abdominal pain and vomiting.  Genitourinary: Negative for dysuria.  Musculoskeletal: Negative for neck pain.  Skin: Negative for rash.  Neurological: Negative for headaches.  Psychiatric/Behavioral: Negative for agitation.    Physical Exam Updated Vital Signs BP 134/86 (BP Location: Right Arm)   Pulse 73   Temp 98 F (36.7 C) (Oral)   Resp 20   SpO2 97%   Physical Exam Vitals and nursing note reviewed.  Constitutional:      General: He is not in acute distress.    Appearance: Normal appearance.  HENT:     Head: Normocephalic and atraumatic.     Nose: Nose normal.  Eyes:     Conjunctiva/sclera: Conjunctivae normal.     Pupils: Pupils are equal, round, and reactive to light.  Cardiovascular:     Rate and Rhythm: Normal rate and regular rhythm.  Pulses: Normal pulses.     Heart sounds: Normal heart sounds.  Pulmonary:     Effort: Pulmonary effort is normal.     Breath sounds: Normal breath sounds.  Abdominal:     General: Abdomen is flat. Bowel sounds are normal.     Palpations: Abdomen is soft.     Tenderness: There is no abdominal tenderness. There is no guarding.  Musculoskeletal:        General: Normal range of motion.     Cervical back: Normal range of motion and neck supple.  Skin:    General: Skin is warm and dry.     Capillary Refill: Capillary refill takes less than 2 seconds.  Neurological:     General: No focal  deficit present.     Mental Status: He is alert and oriented to person, place, and time.     Deep Tendon Reflexes: Reflexes normal.  Psychiatric:        Mood and Affect: Mood normal.        Behavior: Behavior normal.     ED Results / Procedures / Treatments   Labs (all labs ordered are listed, but only abnormal results are displayed) Labs Reviewed - No data to display  EKG None  Radiology No results found.  Procedures Procedures   Medications Ordered in ED Medications - No data to display  ED Course  I have reviewed the triage vital signs and the nursing notes.  Pertinent labs & imaging results that were available during my care of the patient were reviewed by me and considered in my medical decision making (see chart for details).   Seen frequently for similar he only mentioned the SOB when I said you can tell me more about it, it says that is what you checked in for.  He really only wants a "chill pill"/  He was informed he is not getting this in the ED.    [name] was evaluated in Emergency Department on 03/05/2021 for the symptoms described in the history of present illness. He was evaluated in the context of the global COVID-19 pandemic, which necessitated consideration that the patient might be at risk for infection with the SARS-CoV-2 virus that causes COVID-19. Institutional protocols and algorithms that pertain to the evaluation of patients at risk for COVID-19 are in a state of rapid change based on information released by regulatory bodies including the CDC and federal and state organizations. These policies and algorithms were followed during the patient's care in the ED.  Final Clinical Impression(s) / ED Diagnoses Final diagnoses:  Schizophrenia, unspecified type (HCC)   Return for intractable cough, coughing up blood, fevers >100.4 unrelieved by medication, shortness of breath, intractable vomiting, chest pain, shortness of breath, weakness, numbness,  changes in speech, facial asymmetry, abdominal pain, passing out, Inability to tolerate liquids or food, cough, altered mental status or any concerns. No signs of systemic illness or infection. The patient is nontoxic-appearing on exam and vital signs are within normal limits.  I have reviewed the triage vital signs and the nursing notes. Pertinent labs & imaging results that were available during my care of the patient were reviewed by me and considered in my medical decision making (see chart for details). After history, exam, and medical workup I feel the patient has been appropriately medically screened and is safe for discharge home. Pertinent diagnoses were discussed with the patient. Patient was given return precautions.      Colisha Redler, MD 03/05/21 [phone]

## 2021-03-09 ENCOUNTER — Other Ambulatory Visit: Payer: Self-pay

## 2021-03-09 ENCOUNTER — Emergency Department (HOSPITAL_COMMUNITY)
Admission: EM | Admit: 2021-03-09 | Discharge: 2021-03-09 | Disposition: A | Discharge status: Home / Self Care | Payer: Medicaid Other | Attending: Emergency Medicine | Admitting: Emergency Medicine

## 2021-03-09 ENCOUNTER — Encounter (HOSPITAL_COMMUNITY): Payer: Self-pay | Admitting: Emergency Medicine

## 2021-03-09 DIAGNOSIS — F17210 Nicotine dependence, cigarettes, uncomplicated: Secondary | ICD-10-CM | POA: Diagnosis not present

## 2021-03-09 DIAGNOSIS — R110 Nausea: Secondary | ICD-10-CM | POA: Insufficient documentation

## 2021-03-09 MED ORDER — ONDANSETRON 4 MG PO TBDP
4.0000 mg | ORAL_TABLET | Freq: Once | ORAL | Status: AC
  Administered 2021-03-09: 4 mg via ORAL
  Filled 2021-03-09: qty 1

## 2021-03-09 NOTE — ED Triage Notes (Signed)
Pt states he ran out of his xanax. Pt states that without his xanax he cannot focus on reality. Pt states that he has not been thinking about hurting himself or anyone else. Pt does not know when he ran out of xanax

## 2021-03-09 NOTE — ED Notes (Signed)
Pt sleeping in chair, breathing even and unlabored.

## 2021-03-09 NOTE — Discharge Instructions (Addendum)
We recommend follow-up with [facility].  Return for new or concerning symptoms.

## 2021-03-09 NOTE — ED Provider Notes (Signed)
[facility]-EMERGENCY DEPT Provider Note   CSN: [id number] Arrival date & time: 03/09/21  0004     History Chief Complaint  Patient presents with  . Mental Health Problem    [name] is a 30 y.o. male.  30 y/o male with hx of depression and schizophrenia presents to the ED for evaluation.  In triage, patient reports that he ran out of his Xanax and cannot focus on reality.  I am unable to see where the patient is prescribed this medication chronically.  On my assessment with him, he reports that he is feeling nauseous.  He states this just started.  He did not take any medications prior to arrival for nausea.  He has not had any fevers, vomiting, abdominal pain.  Denies illicit drug use including use of marijuana.  No suicidal or homicidal thoughts.  Is followed by [facility] for psychiatric care, but cannot recall the last time he went to an appointment.  The history is provided by the patient. No language interpreter was used.  Mental Health Problem      Past Medical History:  Diagnosis Date  . Depression   . Schizophrenia [facility])     Patient Active Problem List   Diagnosis Date Noted  . 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 Schizophrenia, paranoid type (HCC) 10/24/2020  . Paranoid schizophrenia (HCC) 01/05/2020  . Other schizophrenia (HCC) 02/14/2018  . Cannabis use disorder, moderate, dependence (HCC) 05/10/2016    Past Surgical History:  Procedure Laterality Date  . BACK SURGERY         History reviewed. No pertinent family history.  Social History   Tobacco Use  . Smoking status: Current Some Day Smoker    Types: Cigarettes  . Smokeless tobacco: Never Used  Vaping Use  . Vaping Use: Never used  Substance Use Topics  . Alcohol use: No  . Drug use: Yes    Types: Marijuana    Comment: rare    Home Medications Prior to Admission medications   Medication Sig Start Date End Date Taking? Authorizing Provider  paliperidone  (INVEGA) 6 MG 24 hr tablet Take 1 tablet (6 mg total) by mouth daily for 14 days. Patient not taking: No sig reported 01/02/21 02/22/21  Mare Ferrari, PA-C    Allergies    Shellfish allergy and Bee pollen  Review of Systems   Review of Systems  Ten systems reviewed and are negative for acute change, except as noted in the HPI.    Physical Exam Updated Vital Signs BP 107/79 (BP Location: Left Arm)   Pulse 64   Temp 98.7 F (37.1 C) (Oral)   Resp 15   Ht 5\' 5"  (1.651 m)   Wt 75 kg   SpO2 96%   BMI 27.51 kg/m   Physical Exam Vitals and nursing note reviewed.  Constitutional:      General: He is not in acute distress.    Appearance: He is well-developed. He is not diaphoretic.     Comments: Apathetic, nontoxic.  HENT:     Head: Normocephalic and atraumatic.  Eyes:     General: No scleral icterus.    Conjunctiva/sclera: Conjunctivae normal.  Pulmonary:     Effort: Pulmonary effort is normal. No respiratory distress.     Comments: Respirations even and unlabored Musculoskeletal:        General: Normal range of motion.     Cervical back: Normal range of motion.  Skin:    General: Skin is warm  and dry.     Coloration: Skin is not pale.     Findings: No erythema or rash.  Neurological:     Mental Status: He is alert and oriented to person, place, and time.  Psychiatric:        Behavior: Behavior is withdrawn.     ED Results / Procedures / Treatments   Labs (all labs ordered are listed, but only abnormal results are displayed) Labs Reviewed - No data to display  EKG None  Radiology No results found.  Procedures Procedures   Medications Ordered in ED Medications  ondansetron (ZOFRAN-ODT) disintegrating tablet 4 mg (4 mg Oral Given 03/09/21 0231)    ED Course  I have reviewed the triage vital signs and the nursing notes.  Pertinent labs & imaging results that were available during my care of the patient were reviewed by me and considered in my medical  decision making (see chart for details).    MDM Rules/Calculators/A&P                          30 year old male presents to the emergency department for evaluation.  In triage requesting Xanax, but complains to me about nausea which began after arrival.  He has not had any vomiting.  Denies associated abdominal pain.  Patient noted to be afebrile with stable vital signs.  He was given Zofran and has not had any vomiting after multiple hours of observation.  Tolerating oral fluids.  Seen multiple times in the ED for various complaints.  Do not feel emergent processes present at this time and patient can be discharged to follow-up with [facility].  Patient discharged in stable condition with no unaddressed concerns.   Final Clinical Impression(s) / ED Diagnoses Final diagnoses:  Nausea    Rx / DC Orders ED Discharge Orders    None       Antony Madura, PA-C 03/09/21 2505    Geoffery Lyons, MD 03/10/21 [phone]

## 2021-03-11 ENCOUNTER — Emergency Department (HOSPITAL_COMMUNITY)
Admission: EM | Admit: 2021-03-11 | Discharge: 2021-03-11 | Disposition: A | Discharge status: Home / Self Care | Payer: Medicaid Other | Attending: Emergency Medicine | Admitting: Emergency Medicine

## 2021-03-11 ENCOUNTER — Other Ambulatory Visit: Payer: Self-pay

## 2021-03-11 ENCOUNTER — Encounter (HOSPITAL_COMMUNITY): Payer: Self-pay | Admitting: Emergency Medicine

## 2021-03-11 DIAGNOSIS — R0602 Shortness of breath: Secondary | ICD-10-CM | POA: Diagnosis not present

## 2021-03-11 DIAGNOSIS — F17210 Nicotine dependence, cigarettes, uncomplicated: Secondary | ICD-10-CM | POA: Diagnosis not present

## 2021-03-11 DIAGNOSIS — F419 Anxiety disorder, unspecified: Secondary | ICD-10-CM | POA: Insufficient documentation

## 2021-03-11 DIAGNOSIS — F1010 Alcohol abuse, uncomplicated: Secondary | ICD-10-CM | POA: Insufficient documentation

## 2021-03-11 LAB — COMPREHENSIVE METABOLIC PANEL
ALT: 13 U/L (ref 0–44)
AST: 21 U/L (ref 15–41)
Albumin: 4 g/dL (ref 3.5–5.0)
Alkaline Phosphatase: 36 U/L — ABNORMAL LOW (ref 38–126)
Anion gap: 9 (ref 5–15)
BUN: 14 mg/dL (ref 6–20)
CO2: 28 mmol/L (ref 22–32)
Calcium: 9.2 mg/dL (ref 8.9–10.3)
Chloride: 105 mmol/L (ref 98–111)
Creatinine, Ser: 1.23 mg/dL (ref 0.61–1.24)
GFR, Estimated: >60 mL/min (ref >60)
Glucose, Bld: 93 mg/dL (ref 70–99)
Potassium: 3.8 mmol/L (ref 3.5–5.1)
Sodium: 142 mmol/L (ref 135–145)
Total Bilirubin: 0.8 mg/dL (ref 0.3–1.2)
Total Protein: 6.4 g/dL — ABNORMAL LOW (ref 6.5–8.1)

## 2021-03-11 LAB — CBC WITH DIFFERENTIAL/PLATELET
Abs Immature Granulocytes: 0.01 10*3/uL (ref 0.00–0.07)
Basophils Absolute: 0 10*3/uL (ref 0.0–0.1)
Basophils Relative: 1 %
Eosinophils Absolute: 0.6 10*3/uL — ABNORMAL HIGH (ref 0.0–0.5)
Eosinophils Relative: 11 %
HCT: 39.9 % (ref 39.0–52.0)
Hemoglobin: 13.7 g/dL (ref 13.0–17.0)
Immature Granulocytes: 0 %
Lymphocytes Relative: 52 %
Lymphs Abs: 2.7 10*3/uL (ref 0.7–4.0)
MCH: 31.1 pg (ref 26.0–34.0)
MCHC: 34.3 g/dL (ref 30.0–36.0)
MCV: 90.7 fL (ref 80.0–100.0)
Monocytes Absolute: 0.3 10*3/uL (ref 0.1–1.0)
Monocytes Relative: 5 %
Neutro Abs: 1.6 10*3/uL — ABNORMAL LOW (ref 1.7–7.7)
Neutrophils Relative %: 31 %
Platelets: 204 10*3/uL (ref 150–400)
RBC: 4.4 MIL/uL (ref 4.22–5.81)
RDW: 12.8 % (ref 11.5–15.5)
WBC: 5.2 10*3/uL (ref 4.0–10.5)
nRBC: 0 % (ref 0.0–0.2)

## 2021-03-11 MED ORDER — HYDROXYZINE HCL 25 MG PO TABS: 25.0000 mg | ORAL_TABLET | Freq: Three times a day (TID) | ORAL | 2 refills | Status: DC | PRN

## 2021-03-11 MED ORDER — CHLORDIAZEPOXIDE HCL 25 MG PO CAPS: ORAL_CAPSULE | ORAL | 0 refills | Status: DC

## 2021-03-11 MED ORDER — LORAZEPAM 1 MG PO TABS
1.0000 mg | ORAL_TABLET | Freq: Once | ORAL | Status: AC
  Administered 2021-03-11: 1 mg via ORAL
  Filled 2021-03-11: qty 1

## 2021-03-11 MED ORDER — ALBUTEROL SULFATE HFA 108 (90 BASE) MCG/ACT IN AERS
2.0000 | INHALATION_SPRAY | RESPIRATORY_TRACT | Status: DC | PRN
  Administered 2021-03-11: 2 via RESPIRATORY_TRACT
  Filled 2021-03-11: qty 6.7

## 2021-03-11 NOTE — ED Provider Notes (Signed)
[facility]-EMERGENCY DEPT Provider Note   CSN: [id number] Arrival date & time: 03/11/21  1909     History Chief Complaint  Patient presents with  . Shortness of Breath    [name] is a 30 y.o. male.    Patient with a history of schizophrenia.  He presents to the emergency department because he says his legs are shaking.  He initially talked about being short of breath with the nurses but he tells me that he is not  short of breath anymore.  The history is provided by the patient. No language interpreter was used.  Shortness of Breath Severity:  Mild Onset quality:  Sudden Timing:  Sporadic Progression:  Resolved Chronicity:  New Context: not activity   Relieved by:  Nothing Associated symptoms: no abdominal pain, no chest pain, no cough, no headaches and no rash        Past Medical History:  Diagnosis Date  . Depression   . Schizophrenia [facility])     Patient Active Problem List   Diagnosis Date Noted  . 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 Schizophrenia, paranoid type (HCC) 10/24/2020  . Paranoid schizophrenia (HCC) 01/05/2020  . Other schizophrenia (HCC) 02/14/2018  . Cannabis use disorder, moderate, dependence (HCC) 05/10/2016    Past Surgical History:  Procedure Laterality Date  . BACK SURGERY         No family history on file.  Social History   Tobacco Use  . Smoking status: Current Some Day Smoker    Types: Cigarettes  . Smokeless tobacco: Never Used  Vaping Use  . Vaping Use: Never used  Substance Use Topics  . Alcohol use: No  . Drug use: Yes    Types: Marijuana    Comment: rare    Home Medications Prior to Admission medications   Medication Sig Start Date End Date Taking? Authorizing Provider  hydrOXYzine (ATARAX/VISTARIL) 25 MG tablet Take 1 tablet (25 mg total) by mouth every 8 (eight) hours as needed (Shaking and anxiety). 03/11/21   Bethann Berkshire, MD  paliperidone (INVEGA) 6 MG 24 hr tablet Take 1  tablet (6 mg total) by mouth daily for 14 days. Patient not taking: No sig reported 01/02/21 02/22/21  Mare Ferrari, PA-C    Allergies    Shellfish allergy and Bee pollen  Review of Systems   Review of Systems  Constitutional: Negative for appetite change and fatigue.  HENT: Negative for congestion, ear discharge and sinus pressure.   Eyes: Negative for discharge.  Respiratory: Positive for shortness of breath. Negative for cough.   Cardiovascular: Negative for chest pain.  Gastrointestinal: Negative for abdominal pain and diarrhea.  Genitourinary: Negative for frequency and hematuria.  Musculoskeletal: Negative for back pain.  Skin: Negative for rash.  Neurological: Negative for seizures and headaches.  Psychiatric/Behavioral: Negative for hallucinations.    Physical Exam Updated Vital Signs BP 107/75   Pulse 75   Temp 98.1 F (36.7 C) (Oral)   Resp 15   SpO2 95%   Physical Exam Vitals and nursing note reviewed.  Constitutional:      Appearance: He is well-developed.  HENT:     Head: Normocephalic.     Nose: Nose normal.  Eyes:     Conjunctiva/sclera: Conjunctivae normal.  Neck:     Trachea: No tracheal deviation.  Cardiovascular:     Heart sounds: No murmur heard.   Musculoskeletal:        General: Normal range of motion.  Skin:  General: Skin is warm.  Neurological:     Mental Status: He is alert and oriented to person, place, and time.  Psychiatric:     Comments: anxious     ED Results / Procedures / Treatments   Labs (all labs ordered are listed, but only abnormal results are displayed) Labs Reviewed  CBC WITH DIFFERENTIAL/PLATELET - Abnormal; Notable for the following components:      Result Value   Neutro Abs 1.6 (*)    Eosinophils Absolute 0.6 (*)    All other components within normal limits  COMPREHENSIVE METABOLIC PANEL - Abnormal; Notable for the following components:   Total Protein 6.4 (*)    Alkaline Phosphatase 36 (*)    All other  components within normal limits    EKG None  Radiology No results found.  Procedures Procedures   Medications Ordered in ED Medications  albuterol (VENTOLIN HFA) 108 (90 Base) MCG/ACT inhaler 2 puff (2 puffs Inhalation Given 03/11/21 2009)  LORazepam (ATIVAN) tablet 1 mg (1 mg Oral Given 03/11/21 2009)    ED Course  I have reviewed the triage vital signs and the nursing notes.  Pertinent labs & imaging results that were available during my care of the patient were reviewed by me and considered in my medical decision making (see chart for details).    MDM Rules/Calculators/A&P                        Patient with schizophrenia and anxiety.  Labs unremarkable.  Patient's labs are unremarkable.  He will be sent home with some Vistaril for his anxiety and shaking and follow-up with his doctor   Final Clinical Impression(s) / ED Diagnoses Final diagnoses:  ETOH abuse  Anxiety    Rx / DC Orders ED Discharge Orders         Ordered    chlordiazePOXIDE (LIBRIUM) 25 MG capsule  Status:  Discontinued        03/11/21 1947    hydrOXYzine (ATARAX/VISTARIL) 25 MG tablet  Every 8 hours PRN,   Status:  Discontinued        03/11/21 2158    hydrOXYzine (ATARAX/VISTARIL) 25 MG tablet  Every 8 hours PRN        03/11/21 2158           Bethann Berkshire, MD 03/14/21 1122

## 2021-03-11 NOTE — ED Notes (Signed)
Patient states he has the inhaler previously prescribed, but doesn't know how to use it.

## 2021-03-11 NOTE — Discharge Instructions (Addendum)
Follow-up with your family doctor in 1 to 2 weeks for recheck of this shaking and anxiety

## 2021-03-11 NOTE — ED Triage Notes (Signed)
Patient arrives complaining of shortness of breath. When asked what his shortness of breath and pain felt like, patient continued to answer, "I don't know." Patient given 10 of albuterol and 0.5 of atrovent in route with improvement.

## 2021-03-11 NOTE — ED Notes (Signed)
Patient refused prescription and discharge paperwork

## 2021-03-14 ENCOUNTER — Encounter (HOSPITAL_COMMUNITY): Payer: Self-pay | Admitting: Emergency Medicine

## 2021-03-14 ENCOUNTER — Emergency Department (HOSPITAL_COMMUNITY)
Admission: EM | Admit: 2021-03-14 | Discharge: 2021-03-15 | Disposition: A | Discharge status: Home / Self Care | Payer: Medicaid - Out of State | Attending: Student | Admitting: Student

## 2021-03-14 ENCOUNTER — Other Ambulatory Visit: Payer: Self-pay

## 2021-03-14 DIAGNOSIS — Z5321 Procedure and treatment not carried out due to patient leaving prior to being seen by health care provider: Secondary | ICD-10-CM | POA: Diagnosis not present

## 2021-03-14 DIAGNOSIS — R0602 Shortness of breath: Secondary | ICD-10-CM | POA: Diagnosis not present

## 2021-03-14 DIAGNOSIS — Z5900 Homelessness unspecified: Secondary | ICD-10-CM | POA: Diagnosis not present

## 2021-03-14 MED ORDER — ALBUTEROL SULFATE HFA 108 (90 BASE) MCG/ACT IN AERS: 2.0000 | INHALATION_SPRAY | Freq: Once | RESPIRATORY_TRACT | Status: DC

## 2021-03-14 NOTE — ED Provider Notes (Signed)
Emergency Medicine Provider Triage Evaluation Note  [name] , a 30 y.o. male  was evaluated in triage.  Pt complains of sob. No fever or chills.   Review of Systems  Positive: sob Negative: fever  Physical Exam  There were no vitals taken for this visit. Gen:   Awake, no distress   Resp:  Normal effort. Expiratory wheezing on R side MSK:   Moves extremities without difficulty   Medical Decision Making  Medically screening exam initiated at 11:12 PM.  Appropriate orders placed.  [name] was informed that the remainder of the evaluation will be completed by another provider, this initial triage assessment does not replace that evaluation, and the importance of remaining in the ED until their evaluation is complete.  Albuterol ordered.    Alveria Apley, PA-C 03/14/21 2316    Linwood Dibbles, MD 03/15/21 [phone]

## 2021-03-14 NOTE — ED Triage Notes (Signed)
Patient complaining of sob and is homeless. O2 sat-94% on room air.

## 2021-03-15 NOTE — ED Notes (Signed)
Called for vitals x3 no response. Pt. Stated going outside.

## 2021-03-17 ENCOUNTER — Emergency Department (HOSPITAL_COMMUNITY)
Admission: EM | Admit: 2021-03-17 | Discharge: 2021-03-17 | Disposition: A | Discharge status: Home / Self Care | Payer: Medicaid Other | Attending: Emergency Medicine | Admitting: Emergency Medicine

## 2021-03-17 ENCOUNTER — Ambulatory Visit (HOSPITAL_COMMUNITY)
Admission: AD | Admit: 2021-03-17 | Discharge: 2021-03-17 | Disposition: A | Discharge status: Home / Self Care | Payer: Medicaid Other | Attending: Psychiatry | Admitting: Psychiatry

## 2021-03-17 ENCOUNTER — Encounter (HOSPITAL_COMMUNITY): Payer: Self-pay

## 2021-03-17 DIAGNOSIS — F17210 Nicotine dependence, cigarettes, uncomplicated: Secondary | ICD-10-CM | POA: Insufficient documentation

## 2021-03-17 DIAGNOSIS — F10220 Alcohol dependence with intoxication, uncomplicated: Secondary | ICD-10-CM | POA: Insufficient documentation

## 2021-03-17 DIAGNOSIS — F1010 Alcohol abuse, uncomplicated: Secondary | ICD-10-CM

## 2021-03-17 NOTE — Discharge Instructions (Addendum)
Follow-up with outpatient alcohol treatment facilities as provided in the resource guide.  You need to call to make these arrangements.

## 2021-03-17 NOTE — BH Assessment (Signed)
Comprehensive Clinical Assessment (CCA) Note  03/17/2021 [name] [id number]  DISPOSITION: Per [name], NP, patient is psych cleared. Patient was recommended to be transferred to [facility] for investigation of his "wheezing." Patient left the building AMA before transpiration could arrive.   The patient demonstrates the following risk factors for suicide: Chronic risk factors for suicide include: psychiatric disorder of Schizophrenia, paranoid type and substance use disorder. Acute risk factors for suicide include: N/A. Protective factors for this patient include: positive therapeutic relationship. Considering these factors, the overall suicide risk at this point appears to be low. Patient is not appropriate for outpatient follow up.  Flowsheet Row ED from 03/17/2021 in [facility] ED from 03/14/2021 in [facility] Pre-admit from [facility]  C-SSRS RISK CATEGORY No Risk No Risk No Risk      Patient is a 30 yo male who frequents the [facility] and vus ED's, usually voluntarily. Patient was psych cleared by [name], NP. Patient was "wheezing" and having difficulty breathing. Patient was referred and transported to the [facility].  Patient stated he is "scared for his life." He further explained that he is "scared of life."  Patient also stated "I can't hardly breath." Patient denies SI, HI, NSSH and AVH, Patient does not appear intoxicated, delusional or responding to internal stimuli.   Per history, patient has an ACT Team at [facility]. Reportedly, patient has not been compliant with his Invega medication via injection most recently.    Chief Complaint:  Chief Complaint  Patient presents with   Psychiatric Evaluation   Visit Diagnosis: Schizophrenia, paranoid type by hx Cannabis Use disorder    CCA Screening, Triage and Referral (STR)  Patient Reported Information How did you hear  about [place]? Self  Referral name: GPD/self  Referral phone number: 0 (N/A)   Whom do you see for routine medical problems? I don't have a doctor  Practice/Facility Name: No data recorded Practice/Facility Phone Number: No data recorded Name of Contact: No data recorded Contact Number: No data recorded Contact Fax Number: No data recorded Prescriber Name: No data recorded Prescriber Address (if known): No data recorded  What Is the Reason for Your Visit/Call Today? Patient stated he is "scared for his life." He further explained that he is "scared of life."  Patient also stated "I can't hardly breath."  How Long Has This Been Causing You Problems? > than 6 months  What Do You Feel Would Help You the Most Today? Housing Assistance; Social Support   Have You Recently Been in Any Inpatient Treatment (Hospital/Detox/Crisis Center/28-Day Program)? No  Name/Location of Program/Hospital:[facility]  How Long Were You There? Per chart, "2 days."  When Were You Discharged? 02/24/20 (Per chart.)   Have You Ever Received Services From [facility] Before? Yes  Who Do You See at [facility]? Pt has previous ED, [facility] and [facility] visits.   Have You Recently Had Any Thoughts About Hurting Yourself? No  Are You Planning to Commit Suicide/Harm Yourself At This time? No   Have you Recently Had Thoughts About Hurting Someone [name]? No  Explanation: No data recorded  Have You Used Any Alcohol or Drugs in the Past 24 Hours? No  How Long Ago Did You Use Drugs or Alcohol? 0000 (Unclear)  What Did You Use and How Much? Pt denies, though he has a hx of consistent marijuana and crack-cocaine use   Do You Currently Have a Therapist/Psychiatrist? No (Per history,  patient has an ACT Team.)  Name of Therapist/Psychiatrist: ACT Team- [facility]   Have You Been Recently Discharged From Any Public relations account executive or Programs? No  Explanation of Discharge From Practice/Program: No data recorded    CCA  Screening Triage Referral Assessment Type of Contact: Face-to-Face  Is this Initial or Reassessment? Initial Assessment  Date Telepsych consult ordered in [facility]:  02/24/21  Time Telepsych consult ordered in [facility]:  2014   Patient Reported Information Reviewed? Yes  Patient Left Without Being Seen? No  Reason for Not Completing Assessment: No data recorded  Collateral Involvement: Reviewed Pt's medical record   Does Patient Have a Court Appointed Legal Guardian? No data recorded Name and Contact of Legal Guardian: [name], legal guardian.   If Minor and Not Living with Parent(s), Who has Custody? N/A  Is CPS involved or ever been involved? Never  Is APS involved or ever been involved? In the past   Patient Determined To Be At Risk for Harm To Self or Others Based on Review of Patient Reported Information or Presenting Complaint? No  Method: No Plan  Availability of Means: No access or NA  Intent: Vague intent or NA  Notification Required: No need or identified person  Additional Information for Danger to Others Potential: Active psychosis  Additional Comments for Danger to Others Potential: History of children playing outside triggering patient.  Are There Guns or Other Weapons in Your Home? No  Types of Guns/Weapons: No data recorded Are These Weapons Safely Secured?                            No data recorded Who Could Verify You Are Able To Have These Secured: No data recorded Do You Have any Outstanding Charges, Pending Court Dates, Parole/Probation? None noted  Contacted To Inform of Risk of Harm To Self or Others: Unable to Contact:   Location of Assessment: [facility]   Does Patient Present under Involuntary Commitment? No  IVC Papers Initial File Date: 10/22/20   [place] of Residence: [place]   Patient Currently Receiving the Following Services: ACTT Psychologist, educational) Vesta Mixer)   Determination of Need: Routine (7  days)   Options For Referral: ED Referral (Patient was "wheezing" and having difficulty breathing. Patient was referred and transported to the [facility].)     CCA Biopsychosocial Intake/Chief Complaint:  Patient stated he is "scared for his life." He further explained that he is "scared of life."  Patient also stated "I can't hardly breath."  Current Symptoms/Problems: Pt is wheezing. Patient denies SI, HI, NSSH and AVH, Patient does not appear intoxicated, delusional or responding to internal stimuli.   Patient Reported Schizophrenia/Schizoaffective Diagnosis in Past: Yes   Strengths: Not assessed.  Preferences: Not assessed.  Abilities: Not assessed.   Type of Services Patient Feels are Needed: Pt states he does not know   Initial Clinical Notes/Concerns: None noted   Mental Health Symptoms Depression:   None   Duration of Depressive symptoms:  Greater than two weeks   Mania:   None   Anxiety:    Tension; Worrying   Psychosis:   Affective flattening/alogia/avolition   Duration of Psychotic symptoms:  Greater than six months   Trauma:   None   Obsessions:   None   Compulsions:   None   Inattention:   None   Hyperactivity/Impulsivity:   N/A   Oppositional/Defiant Behaviors:   None   Emotional Irregularity:  None   Other Mood/Personality Symptoms:   None noted    Mental Status Exam Appearance and self-care  Stature:   Average   Weight:   Average weight   Clothing:   Age-appropriate; Casual   Grooming:   Normal   Cosmetic use:   None   Posture/gait:   Stooped   Motor activity:   Not Remarkable   Sensorium  Attention:   Normal   Concentration:   Normal   Orientation:   -- (UTA)   Recall/memory:   Normal   Affect and Mood  Affect:   Blunted   Mood:   Depressed   Relating  Eye contact:   Normal   Facial expression:   Constricted   Attitude toward examiner:   Cooperative   Thought and Language   Speech flow:  Paucity   Thought content:   Appropriate to Mood and Circumstances   Preoccupation:   None   Hallucinations:   None   Organization:  No data recorded  Affiliated Computer ServicesExecutive Functions  Fund of Knowledge:   Poor   Intelligence:   Needs investigation   Abstraction:   Abstract   Judgement:   Poor   Reality Testing:   Distorted   Insight:   Gaps   Decision Making:   Impulsive   Social Functioning  Social Maturity:   Impulsive   Social Judgement:   Heedless   Stress  Stressors:   Housing; Surveyor, quantityinancial; Illness   Coping Ability:   Overwhelmed   Skill Deficits:   Communication; Decision making; Self-care; Self-control   Supports:   Support needed     Religion: Religion/Spirituality Are You A Religious Person?:  (Not assessed) How Might This Affect Treatment?: Not assessed  Leisure/Recreation: Leisure / Recreation Do You Have Hobbies?:  (Not assessed)  Exercise/Diet: Exercise/Diet Do You Exercise?:  (Not assessed) Have You Gained or Lost A Significant Amount of Weight in the Past Six Months?:  (Not assessed) Do You Follow a Special Diet?:  (Not assessed) Do You Have Any Trouble Sleeping?:  (Not assessed) Explanation of Sleeping Difficulties: Not assessed   CCA Employment/Education Employment/Work Situation: Employment / Work Situation Employment Situation: On disability Why is Patient on Disability: Mental health How Long has Patient Been on Disability: UTA What is the Longest Time Patient has Held a Job?: UTA Where was the Patient Employed at that Time?: UTA Has Patient ever Been in the [employer] BancorpMilitary?: No  Education: Education Is Patient Currently Attending School?: No Last Grade Completed: 9 Name of High School: [employer] Did Garment/textile technologistYou Graduate From [employer]?: No Did You Product managerAttend College?: No Did Designer, television/film setYou Attend Graduate School?: No Did You Have Any Special Interests In School?: Not assessed Did You Have An Individualized Education  Program (IIEP): No Did You Have Any Difficulty At School?: No   CCA Family/Childhood History Family and Relationship History: Family history Marital status: Single What is your sexual orientation?: Not assessed. Has your sexual activity been affected by drugs, alcohol, medication, or emotional stress?: Not assessed. Does patient have children?: No  Childhood History:  Childhood History Additional childhood history information: UTA Description of patient's relationship with caregiver when they were a child: UTA Patient's description of current relationship with people who raised him/her: UTA How were you disciplined when you got in trouble as a child/adolescent?: UTA Does patient have siblings?: No Did patient suffer any verbal/emotional/physical/sexual abuse as a child?: No Did patient suffer from severe childhood neglect?: No Has patient ever been sexually abused/assaulted/raped as an  adolescent or adult?: No Was the patient ever a victim of a crime or a disaster?: No Witnessed domestic violence?: No Has patient been affected by domestic violence as an adult?: No  Child/Adolescent Assessment:     CCA Substance Use Alcohol/Drug Use: Alcohol / Drug Use Pain Medications: See MAR Prescriptions: See MAR Over the Counter: See MAR History of alcohol / drug use?: Yes (Pt denies SA) Longest period of sobriety (when/how long): Unknown Negative Consequences of Use:  (Pt denies SA) Withdrawal Symptoms:  (Pt denies SA) Substance #1 Name of Substance 1: Marijuana 1 - Age of First Use: UTA 1 - Amount (size/oz): Varied 1 - Frequency: Daily 1 - Duration: Ongoing 1 - Last Use / Amount: 01/15/2021 Substance #2 Name of Substance 2: Alcohol 2 - Age of First Use: teens 2 - Amount (size/oz): varied 2 - Frequency: varied 2 - Duration: last few UDS's have been negative for ETOH. He reports drinking alcohol periodically. 2 - Last Use / Amount: Unknown   SAM's:  Six Dimensions of  Multidimensional Assessment  Dimension 1:  Acute Intoxication and/or Withdrawal Potential:   Dimension 1:  Description of individual's past and current experiences of substance use and withdrawal: Pt reports that he use to smoke marijuana.  Dimension 2:  Biomedical Conditions and Complications:   Dimension 2:  Description of patient's biomedical conditions and  complications: Pt did not report medical  Dimension 3:  Emotional, Behavioral, or Cognitive Conditions and Complications:  Dimension 3:  Description of emotional, behavioral, or cognitive conditions and complications: Schizophrenia, Paranoid  Dimension 4:  Readiness to Change:  Dimension 4:  Description of Readiness to Change criteria: Pt reports that he is not taking medicaiton  Dimension 5:  Relapse, Continued use, or Continued Problem Potential:  Dimension 5:  Relapse, continued use, or continued problem potential critiera description: precontemplation  Dimension 6:  Recovery/Living Environment:  Dimension 6:  Recovery/Iiving environment criteria description: Pt reports that he lives in different areas.  ASAM Severity Score: ASAM's Severity Rating Score: 6  ASAM Recommended Level of Treatment: ASAM Recommended Level of Treatment: Level I Outpatient Treatment   Substance use Disorder (SUD) Substance Use Disorder (SUD)  Checklist Symptoms of Substance Use: Continued use despite having a persistent/recurrent physical/psychological problem caused/exacerbated by use, Continued use despite persistent or recurrent social, interpersonal problems, caused or exacerbated by use  Recommendations for Services/Supports/Treatments: Recommendations for Services/Supports/Treatments Recommendations For Services/Supports/Treatments: Medication Management, Individual Therapy, Other (Comment) (Overnight observation)  DSM5 Diagnoses: Patient Active Problem List   Diagnosis Date Noted   Substance-induced disorder (HCC) 01/02/2021   Schizophrenia,  paranoid type (HCC) 10/24/2020   Paranoid schizophrenia (HCC) 01/05/2020   Other schizophrenia (HCC) 02/14/2018   Cannabis use disorder, moderate, dependence (HCC) 05/10/2016    Patient Centered Plan: Patient is on the following Treatment Plan(s):  Substance Abuse   Referrals to Alternative Service(s): Referred to Alternative Service(s):   Place:   Date:   Time:    Referred to Alternative Service(s):   Place:   Date:   Time:    Referred to Alternative Service(s):   Place:   Date:   Time:    Referred to Alternative Service(s):   Place:   Date:   Time:     Carolanne Grumbling, Counselor  Corrie Dandy T. Jimmye Norman, MS, [facility], [facility] Triage Specialist [facility]

## 2021-03-17 NOTE — ED Triage Notes (Signed)
Pt presents asking for detox from alcohol. States he has been drinking "a lot" when asked specifics patients states "I don't know I just feel dizzy". Last drink within a couple hours

## 2021-03-17 NOTE — ED Provider Notes (Signed)
[facility]-EMERG[facility] DEPT Provider Note   CSN: [id number] Arrival date & time: 03/17/21  0039     History Chief Complaint  Patient presents with   Alcohol Intoxication    [name] is a 30 y.o. male.  Patient is a 30 year old male with past medical history of schizophrenia, alcohol abuse, cannabis abuse.  Patient presenting today requesting alcohol detox.  Patient reports consuming alcohol with greater frequency recently.  He denies to me that he drinks every day and also denies becoming shaky on days that he does not drink.  Patient well-known to the emergency department for visits involving psychiatric issues.  The history is provided by the patient.      Past Medical History:  Diagnosis Date   Depression    Schizophrenia [facility])     Patient Active Problem List   Diagnosis Date Noted   Substance-induced disorder (HCC) 01/02/2021   Schizophrenia, paranoid type (HCC) 10/24/2020   Paranoid schizophrenia (HCC) 01/05/2020   Other schizophrenia (HCC) 02/14/2018   Cannabis use disorder, moderate, dependence (HCC) 05/10/2016    Past Surgical History:  Procedure Laterality Date   BACK SURGERY         History reviewed. No pertinent family history.  Social History   Tobacco Use   Smoking status: Some Days    Pack years: 0.00    Types: Cigarettes   Smokeless tobacco: Never  Vaping Use   Vaping Use: Never used  Substance Use Topics   Alcohol use: No   Drug use: Yes    Types: Marijuana    Comment: rare    Home Medications Prior to Admission medications   Medication Sig Start Date End Date Taking? Authorizing Provider  hydrOXYzine (ATARAX/VISTARIL) 25 MG tablet Take 1 tablet (25 mg total) by mouth every 8 (eight) hours as needed (Shaking and anxiety). 03/11/21   Bethann Berkshire, MD  paliperidone (INVEGA) 6 MG 24 hr tablet Take 1 tablet (6 mg total) by mouth daily for 14 days. Patient not taking: No sig reported 01/02/21 02/22/21  Mare Ferrari,  PA-C    Allergies    Shellfish allergy and Bee pollen  Review of Systems   Review of Systems  All other systems reviewed and are negative.  Physical Exam Updated Vital Signs BP 128/80   Pulse 87   Temp (!) 97.5 F (36.4 C) (Oral)   Resp 16   SpO2 98%   Physical Exam Vitals and nursing note reviewed.  Constitutional:      General: He is not in acute distress.    Appearance: He is well-developed. He is not diaphoretic.  HENT:     Head: Normocephalic and atraumatic.  Cardiovascular:     Rate and Rhythm: Normal rate and regular rhythm.     Heart sounds: No murmur heard.   No friction rub.  Pulmonary:     Effort: Pulmonary effort is normal. No respiratory distress.     Breath sounds: Normal breath sounds. No wheezing or rales.  Abdominal:     General: Bowel sounds are normal. There is no distension.     Palpations: Abdomen is soft.     Tenderness: There is no abdominal tenderness.  Musculoskeletal:        General: Normal range of motion.     Cervical back: Normal range of motion and neck supple.  Skin:    General: Skin is warm and dry.  Neurological:     Mental Status: He is alert and oriented to person, place,  and time.     Coordination: Coordination normal.    ED Results / Procedures / Treatments   Labs (all labs ordered are listed, but only abnormal results are displayed) Labs Reviewed - No data to display  EKG None  Radiology No results found.  Procedures Procedures   Medications Ordered in ED Medications - No data to display  ED Course  I have reviewed the triage vital signs and the nursing notes.  Pertinent labs & imaging results that were available during my care of the patient were reviewed by me and considered in my medical decision making (see chart for details).    MDM Rules/Calculators/A&P  Patient presenting requesting detox from alcohol.  Patient's vitals are stable and he does not appear to be an active withdrawal.  Patient just had  laboratory studies several days ago and I do not see a reason to repeat these.  Patient will be given outpatient resources and is to follow-up with them.  Final Clinical Impression(s) / ED Diagnoses Final diagnoses:  None    Rx / DC Orders ED Discharge Orders     None        Geoffery Lyons, MD 03/17/21 0301

## 2021-03-17 NOTE — H&P (Signed)
Behavioral Health Medical Screening Exam  [name] is a 30 y.o. male with a history of schizophrenia who presents to [facility] as walk-in due to "I feel scared for my life." Patient was evaluated at [facility] on 03/17/2021 and discharged at 0342. He walked to [facility]. Patient is unable to elaborate on why he feels scared. He denies suicidal ideations. Denies homicidal ideations. Denies AVH. No indication that he is responding to internal stimuli.    Patient presents with expiratory and inspiratory wheezing. Possibly exercise induced. No rhonchi. O2 sat 100%, Respirations 16-18. VSS. Patient reports that he does not have an inhaler. States "I don't know how to use them." Fransico Michael, [facility] searched his belongings and was unable to locate an inhaler.   Total Time spent with patient: 15 minutes  Psychiatric Specialty Exam:  Presentation  General Appearance: Casual  Eye Contact:Fair  Speech:Clear and Coherent; Normal Rate  Speech Volume:Decreased  Handedness:Right   Mood and Affect  Mood:Euthymic  Affect:Congruent   Thought Process  Thought Processes:Coherent  Descriptions of Associations:Intact  Orientation:Full (Time, Place and Person)  Thought Content:Paranoid Ideation  History of Schizophrenia/Schizoaffective disorder:Yes  Duration of Psychotic Symptoms:Greater than six months  Hallucinations:Hallucinations: None  Ideas of Reference:Paranoia  Suicidal Thoughts:Suicidal Thoughts: No  Homicidal Thoughts:Homicidal Thoughts: No   Sensorium  Memory:Immediate Fair; Recent Fair; Remote Fair  Judgment:Intact  Insight:Fair   Executive Functions  Concentration:Fair  Attention Span:Fair  Recall:Fair  Fund of Knowledge:Fair  Language:Fair   Psychomotor Activity  Psychomotor Activity:Psychomotor Activity: Normal   Assets  Assets:Desire for Improvement; Financial Resources/Insurance; Housing; Physical Health   Sleep  Sleep: No data recorded   Physical  Exam: Physical Exam Vitals reviewed.  Constitutional:      General: He is not in acute distress.    Appearance: He is not toxic-appearing or diaphoretic.  HENT:     Right Ear: External ear normal.     Left Ear: External ear normal.  Eyes:     Pupils: Pupils are equal, round, and reactive to light.  Cardiovascular:     Rate and Rhythm: Normal rate.  Pulmonary:     Effort: Pulmonary effort is normal.     Breath sounds: Wheezing present.  Musculoskeletal:        General: Normal range of motion.  Neurological:     Mental Status: He is alert and oriented to person, place, and time.  Psychiatric:        Mood and Affect: Affect is blunt.        Behavior: Behavior is cooperative.        Thought Content: Thought content is paranoid.   Review of Systems  Constitutional:  Negative for chills, diaphoresis, fever, malaise/fatigue and weight loss.  Respiratory:  Positive for wheezing. Negative for cough.   Gastrointestinal:  Negative for diarrhea, nausea and vomiting.  Neurological:  Negative for dizziness and seizures.  Psychiatric/Behavioral:  Negative for depression, hallucinations and suicidal ideas. The patient has insomnia. The patient is not nervous/anxious.   Blood pressure 138/84, pulse 80, temperature 97.7 F (36.5 C), temperature source Oral, SpO2 100 %. There is no height or weight on file to calculate BMI.  Musculoskeletal: Strength & Muscle Tone: within normal limits Gait & Station: normal Patient leans: N/A   Recommendations:  Based on my evaluation the patient does not appear to have an emergency medical condition.  Patient presents with expiratory and inspiratory wheezing. Possibly exercise induced. No rhonchi. O2 sat 100%, Respirations 16-18. Patient reports that he does not have  an inhaler. States "I don't know how to use them."   Unfortunately, we are unable to provide walk-ins at [facility] with medications. Will transfer to [facility] for further treatment.   Jackelyn Poling,  NP 03/17/2021, 4:52 AM

## 2021-03-20 ENCOUNTER — Emergency Department (HOSPITAL_COMMUNITY)
Admission: EM | Admit: 2021-03-20 | Discharge: 2021-03-20 | Disposition: A | Discharge status: Home / Self Care | Payer: Medicaid Other | Attending: Emergency Medicine | Admitting: Emergency Medicine

## 2021-03-20 ENCOUNTER — Other Ambulatory Visit: Payer: Self-pay

## 2021-03-20 DIAGNOSIS — Z5321 Procedure and treatment not carried out due to patient leaving prior to being seen by health care provider: Secondary | ICD-10-CM | POA: Diagnosis not present

## 2021-03-20 DIAGNOSIS — R0602 Shortness of breath: Secondary | ICD-10-CM | POA: Diagnosis not present

## 2021-03-20 NOTE — ED Provider Notes (Signed)
Attempted to see patient.  Notified that he left.     Roxy Horseman, PA-C 03/20/21 0401    Sabas Sous, MD 03/20/21 [phone]

## 2021-03-20 NOTE — ED Triage Notes (Signed)
Attempted to triage patient but found him in the hallway leaving. Asked patient where he was going, he just stated, "Out here." Informed patient if he leaves the department, the doctor is unable to care for him or evaluate him and we will have to discharge him out of the system. Patient states, "[name]" as he continues to leave department. Patient seen exiting into lobby.

## 2021-03-21 ENCOUNTER — Ambulatory Visit (HOSPITAL_COMMUNITY)
Admission: EM | Admit: 2021-03-21 | Discharge: 2021-03-21 | Disposition: A | Discharge status: Home / Self Care | Payer: Medicaid Other | Attending: Psychiatry | Admitting: Psychiatry

## 2021-03-21 DIAGNOSIS — F1010 Alcohol abuse, uncomplicated: Secondary | ICD-10-CM | POA: Insufficient documentation

## 2021-03-21 DIAGNOSIS — F200 Paranoid schizophrenia: Secondary | ICD-10-CM | POA: Insufficient documentation

## 2021-03-21 DIAGNOSIS — F1999 Other psychoactive substance use, unspecified with unspecified psychoactive substance-induced disorder: Secondary | ICD-10-CM | POA: Diagnosis present

## 2021-03-21 DIAGNOSIS — F1994 Other psychoactive substance use, unspecified with psychoactive substance-induced mood disorder: Secondary | ICD-10-CM | POA: Insufficient documentation

## 2021-03-21 NOTE — BH Assessment (Addendum)
Pt reports he was BIB a Careers adviser because he is paranoid "about life". Pt is sleep in assessment room. Denies SI, HI, AVH. Pt is routine.

## 2021-03-21 NOTE — ED Notes (Signed)
Educated on d/c instructions. Verbalized understanding. Ambulated per self to retrieve belongings from blue locker. Escorted to front lobby to d/c. Medically stable at time of d/c

## 2021-03-21 NOTE — ED Notes (Signed)
Blue locker 

## 2021-03-21 NOTE — ED Provider Notes (Addendum)
[facility] Medical Screening Exam  Patient Name: [name] MRN: [id number] Date of Evaluation: 03/21/21 Chief Complaint:   Diagnosis:  Final diagnoses:  Alcohol abuse  Substance-induced disorder (HCC)  Paranoid schizophrenia (HCC)   Subjective:   [name] is a 30 y.o. male patient presented to [facility] as a walk in alone with complaints of "I am just tired".  [name], 30 y.o., male patient seen face to face by this provider, consulted with Dr. Lucianne Muss; and chart reviewed on 03/21/21.   History of Present illness:   During evaluation [name] is laying down on his seat asleep in no acute distress. He is easily awakened.. but drifts off to sleep during the evaluation. He is alert, oriented x 4, calm and cooperative. His mood is depresses with congruent affect.  He does not appear to be responding to internal/external stimuli or delusional thoughts.  Patient denies suicidal/self-harm/homicidal ideation, psychosis.   Patient reports he drinks alcohol daily. States his last drink was "a few hours ago". Declines any inpatient or outpatient resources to help with detox or rehabilitation.  Patient agreed for this writer to contact his act team.  He agreed for the act team to come and pick him up.  Patient reports that he does not take any medications, because he does not need them.  Collateral obtained from [name] [phone] from act team.  She states patient is not homeless.  States he has an apartment in [place], but he does not feel safe there.  States she has helped put alarms on the windows to help patient feel more secure. States he is paranoid, and that is his baseline.  States the patient comes to the [facility], because he feels safe here.  [name] states that her last today is June 19.  States if [place] continues to come in, we should call the [facility] ACT team crisis line [phone].  It is a maps answering system and it gets forwarded to the act  team.  Attempted to contact patient's aunt Clide Cliff, she is his payee [phone] was unsuccessful.  Per chart review patient has a long history of ED and [facility] visits for paranoid schizophrenia and ETOH abuse.     Psychiatric Specialty Exam  Presentation  General Appearance:Disheveled  Eye Contact:Minimal  Speech:Clear and Coherent; Normal Rate  Speech Volume:Normal  Handedness:Right   Mood and Affect  Mood:Depressed  Affect:Congruent   Thought Process  Thought Processes:Coherent  Descriptions of Associations:Intact  Orientation:Full (Time, Place and Person)  Thought Content:Logical  Diagnosis of Schizophrenia or Schizoaffective disorder in past: Yes  Duration of Psychotic Symptoms: Greater than six months  Hallucinations:None Someone is after me  Ideas of Reference:Paranoia  Suicidal Thoughts:No  Homicidal Thoughts:No   Sensorium  Memory:Immediate Fair; Recent Fair; Remote Fair  Judgment:Fair  Insight:Fair   Executive Functions  Concentration:Fair  Attention Span:Fair  Recall:Fair  Fund of Knowledge:Fair  Language:Fair   Psychomotor Activity  Psychomotor Activity:Normal   Assets  Assets:Desire for Improvement; Physical Health; Resilience   Sleep  Sleep:Fair  Number of hours: -1   No data recorded  Physical Exam: Physical Exam Vitals and nursing note reviewed.  Constitutional:      Appearance: He is well-developed.  HENT:     Head: Normocephalic and atraumatic.     Right Ear: External ear normal.     Left Ear: External ear normal.  Eyes:     Conjunctiva/sclera: Conjunctivae normal.  Cardiovascular:     Rate and Rhythm:  Normal rate and regular rhythm.     Heart sounds: No murmur heard. Pulmonary:     Effort: Pulmonary effort is normal. No respiratory distress.     Breath sounds: Normal breath sounds.  Abdominal:     Palpations: Abdomen is soft.     Tenderness: There is no abdominal tenderness.  Musculoskeletal:         General: Normal range of motion.     Cervical back: Normal range of motion.  Skin:    General: Skin is warm and dry.  Neurological:     Mental Status: He is alert and oriented to person, place, and time.  Psychiatric:        Attention and Perception: Attention and perception normal.        Mood and Affect: Mood is depressed.        Speech: Speech normal.        Behavior: Behavior is cooperative.        Thought Content: Thought content is paranoid.        Cognition and Memory: Cognition normal.        Judgment: Judgment is impulsive.   Review of Systems  Constitutional: Negative.   HENT: Negative.    Eyes: Negative.   Respiratory: Negative.    Cardiovascular: Negative.   Musculoskeletal: Negative.   Skin: Negative.   Neurological: Negative.   Psychiatric/Behavioral:  Positive for depression.   Blood pressure 130/82, pulse 63, temperature 97.7 F (36.5 C), temperature source Oral, resp. rate 18, SpO2 100 %. There is no height or weight on file to calculate BMI.  Musculoskeletal: Strength & Muscle Tone: within normal limits Gait & Station: normal Patient leans: N/A   [facility] MSE Discharge Disposition for Follow up and Recommendations: Based on my evaluation the patient does not appear to have an emergency medical condition and can be discharged with resources and follow up care in outpatient services for patient declined resources.    Discharge patient, ACT team will pick patient up and escort him home.   Ardis Hughs, NP 03/21/2021, 7:00 PM

## 2021-03-21 NOTE — Discharge Instructions (Addendum)

## 2021-03-23 ENCOUNTER — Emergency Department (HOSPITAL_COMMUNITY): Payer: Medicaid - Out of State

## 2021-03-23 ENCOUNTER — Encounter (HOSPITAL_COMMUNITY): Payer: Self-pay | Admitting: *Deleted

## 2021-03-23 ENCOUNTER — Emergency Department (HOSPITAL_COMMUNITY)
Admission: EM | Admit: 2021-03-23 | Discharge: 2021-03-23 | Disposition: A | Discharge status: Home / Self Care | Payer: Medicaid - Out of State | Attending: Emergency Medicine | Admitting: Emergency Medicine

## 2021-03-23 DIAGNOSIS — J4520 Mild intermittent asthma, uncomplicated: Secondary | ICD-10-CM | POA: Insufficient documentation

## 2021-03-23 DIAGNOSIS — R0602 Shortness of breath: Secondary | ICD-10-CM | POA: Diagnosis present

## 2021-03-23 DIAGNOSIS — F17210 Nicotine dependence, cigarettes, uncomplicated: Secondary | ICD-10-CM | POA: Insufficient documentation

## 2021-03-23 MED ORDER — ALBUTEROL SULFATE HFA 108 (90 BASE) MCG/ACT IN AERS
2.0000 | INHALATION_SPRAY | Freq: Once | RESPIRATORY_TRACT | Status: AC
  Administered 2021-03-23: 2 via RESPIRATORY_TRACT
  Filled 2021-03-23: qty 6.7

## 2021-03-23 NOTE — ED Notes (Addendum)
Pt provided with a [place] sandwich and bus pass per request. Pt refused discharge vital signs and discharge papers.

## 2021-03-23 NOTE — Discharge Instructions (Signed)
As discussed, follow-up with the behavioral urgent care if needed. Use the inhaler every 6 hours only if wheezing.

## 2021-03-23 NOTE — ED Notes (Signed)
Patient refused lab draw. Triage RN made aware. 

## 2021-03-23 NOTE — ED Provider Notes (Signed)
[facility]-EMERGENCY DEPT Provider Note   CSN: [id number] Arrival date & time: 03/23/21  1328     History Chief Complaint  Patient presents with   Asthma    Rion Catala is a 30 y.o. male.  HPI    30 year old male comes in a chief complaint of asthma He has history of alcoholism, substance use disorder, schizophrenia. Patient reports that his been having some shortness of breath.  He has history of asthma, and it feels like his symptoms are because of it.  He is unsure why he has a trigger.  He has not taken any breathing treatments prior to ED arrival.  In route, it appears that patient received 5 mg of DuoNeb and 125 mg of IV Solu-Medrol.  Patient has extensive psych history.  He denies any SI, HI.   Past Medical History:  Diagnosis Date   Depression    Schizophrenia [facility])     Patient Active Problem List   Diagnosis Date Noted   Alcohol abuse 03/21/2021   Substance-induced disorder (HCC) 01/02/2021   Schizophrenia, paranoid type (HCC) 10/24/2020   Paranoid schizophrenia (HCC) 01/05/2020   Other schizophrenia (HCC) 02/14/2018   Cannabis use disorder, moderate, dependence (HCC) 05/10/2016    Past Surgical History:  Procedure Laterality Date   BACK SURGERY         No family history on file.  Social History   Tobacco Use   Smoking status: Some Days    Pack years: 0.00    Types: Cigarettes   Smokeless tobacco: Never  Vaping Use   Vaping Use: Never used  Substance Use Topics   Alcohol use: No   Drug use: Yes    Types: Marijuana    Comment: rare    Home Medications Prior to Admission medications   Medication Sig Start Date End Date Taking? Authorizing Provider  hydrOXYzine (ATARAX/VISTARIL) 25 MG tablet Take 1 tablet (25 mg total) by mouth every 8 (eight) hours as needed (Shaking and anxiety). Patient not taking: Reported on 03/21/2021 03/11/21   Bethann Berkshire, MD  paliperidone (INVEGA) 6 MG 24 hr tablet Take 1 tablet (6 mg total)  by mouth daily for 14 days. Patient not taking: No sig reported 01/02/21 02/22/21  Mare Ferrari, PA-C    Allergies    Shellfish allergy and Bee pollen  Review of Systems   Review of Systems  Constitutional:  Positive for activity change.  Respiratory:  Positive for shortness of breath and wheezing. Negative for cough.   Cardiovascular:  Negative for chest pain.  Gastrointestinal:  Negative for nausea and vomiting.  All other systems reviewed and are negative.  Physical Exam Updated Vital Signs BP 131/83 (BP Location: Left Arm)   Pulse 71   Temp 98.9 F (37.2 C) (Oral)   Resp 18   SpO2 99%   Physical Exam Vitals and nursing note reviewed.  Constitutional:      Appearance: He is well-developed.  HENT:     Head: Atraumatic.  Cardiovascular:     Rate and Rhythm: Normal rate.  Pulmonary:     Effort: Pulmonary effort is normal.     Breath sounds: No wheezing.  Musculoskeletal:     Cervical back: Neck supple.  Skin:    General: Skin is warm.  Neurological:     Mental Status: He is alert and oriented to person, place, and time.    ED Results / Procedures / Treatments   Labs (all labs ordered are listed, but only abnormal  results are displayed) Labs Reviewed  MAGNESIUM    EKG None  Radiology DG Chest 2 View  Result Date: 03/23/2021 CLINICAL DATA:  Shortness of breath EXAM: CHEST - 2 VIEW COMPARISON:  January 31, 2021 FINDINGS: The lungs are clear. The heart size and pulmonary vascularity are normal. No adenopathy. No pneumothorax. No bone lesions. IMPRESSION: Lungs clear.  Cardiac silhouette normal. Electronically Signed   By: Bretta Bang III M.D.   On: 03/23/2021 14:27    Procedures Procedures   Medications Ordered in ED Medications - No data to display  ED Course  I have reviewed the triage vital signs and the nursing notes.  Pertinent labs & imaging results that were available during my care of the patient were reviewed by me and considered in my  medical decision making (see chart for details).    MDM Rules/Calculators/A&P                          30 year old comes in a chief complaint of wheezing, shortness of breath.  He has history of asthma.  Our exam reveals clear lung fields.  Patient is already received DuoNeb and Solu-Medrol.  X-ray reviewed, it does not show any evidence of pneumonia.  Patient has extensive psych history, recently seen by peer health team and cleared.  He denies any SI, HI.  There is a Transport planner crisis team/act team contact listed for him, we will notify them about his visit here so that they can continue to follow him.  Final Clinical Impression(s) / ED Diagnoses Final diagnoses:  Mild intermittent asthma without complication    Rx / DC Orders ED Discharge Orders     None        Derwood Kaplan, MD 03/23/21 1803

## 2021-03-23 NOTE — ED Triage Notes (Signed)
Pt from home complains of SOB. Hx of asthma. Pt given 5mg  albuterol, duoneb, 125mg  solumedrol IV.   HR 112 BP 139/84 RR 15 O2 97 RA

## 2021-03-23 NOTE — ED Provider Notes (Signed)
Emergency Medicine Provider Triage Evaluation Note  [name] , a 30 y.o. male  was evaluated in triage.  Pt complains of shortness of breath worsening over the last couple days.  Patient received albuterol, DuoNeb, Solu-Medrol.  Review of Systems  Positive: Shortness of breath Negative: Cough, chest pain, abdominal pain, fever  Physical Exam  BP (!) 148/84 (BP Location: Left Arm)   Pulse 97   Temp 98.9 F (37.2 C) (Oral)   Resp 18   SpO2 94%  Gen:   Awake, no distress   Resp:  Normal effort.  Wheezing in all fields. MSK:   Moves extremities without difficulty  Other:  No increased work of breathing.  Speaks in full sentences without difficulty.  Medical Decision Making  Medically screening exam initiated at 1:40 PM.  Appropriate orders placed.  [name] was informed that the remainder of the evaluation will be completed by another provider, this initial triage assessment does not replace that evaluation, and the importance of remaining in the ED until their evaluation is complete.     Anselm Pancoast, PA-C 03/23/21 1344    Bethann Berkshire, MD 03/25/21 [phone]

## 2021-03-23 NOTE — ED Notes (Signed)
Pt is aware that a urine sample is needed, urinal given. 

## 2021-03-25 ENCOUNTER — Emergency Department (HOSPITAL_COMMUNITY)
Admission: EM | Admit: 2021-03-25 | Discharge: 2021-03-25 | Discharge status: Against Medical Advice | Payer: Medicaid - Out of State | Source: Home / Self Care

## 2021-03-25 ENCOUNTER — Other Ambulatory Visit: Payer: Self-pay

## 2021-04-05 ENCOUNTER — Encounter (HOSPITAL_COMMUNITY): Payer: Self-pay

## 2021-04-05 ENCOUNTER — Emergency Department (HOSPITAL_COMMUNITY)
Admission: EM | Admit: 2021-04-05 | Discharge: 2021-04-05 | Disposition: A | Discharge status: Home / Self Care | Payer: Medicaid - Out of State | Attending: Emergency Medicine | Admitting: Emergency Medicine

## 2021-04-05 DIAGNOSIS — J45901 Unspecified asthma with (acute) exacerbation: Secondary | ICD-10-CM

## 2021-04-05 DIAGNOSIS — R410 Disorientation, unspecified: Secondary | ICD-10-CM | POA: Insufficient documentation

## 2021-04-05 DIAGNOSIS — R0602 Shortness of breath: Secondary | ICD-10-CM | POA: Diagnosis present

## 2021-04-05 DIAGNOSIS — F17210 Nicotine dependence, cigarettes, uncomplicated: Secondary | ICD-10-CM | POA: Diagnosis not present

## 2021-04-05 DIAGNOSIS — J4521 Mild intermittent asthma with (acute) exacerbation: Secondary | ICD-10-CM | POA: Insufficient documentation

## 2021-04-05 MED ORDER — ALBUTEROL SULFATE HFA 108 (90 BASE) MCG/ACT IN AERS
2.0000 | INHALATION_SPRAY | Freq: Once | RESPIRATORY_TRACT | Status: AC
  Administered 2021-04-05: 2 via RESPIRATORY_TRACT
  Filled 2021-04-05: qty 6.7

## 2021-04-05 NOTE — Discharge Instructions (Addendum)
Return for any problem.  ?

## 2021-04-05 NOTE — ED Provider Notes (Signed)
[facility]-EMERGENCY DEPT Provider Note   CSN: [id number] Arrival date & time: 04/05/21  1523     History Chief Complaint  Patient presents with   Shortness of Breath    [name] is a 30 y.o. male.  31 year old male with prior medical history as detailed below presents for evaluation.  Patient apparently was outside at the bus stop.  He began to feel wheezy.  He presents requesting albuterol.  He denies other complaint.  He is not in distress on evaluation.  In addition to albuterol tx the patient is asking for a bus pass and a [place] sandwich.  The history is provided by the patient and medical records.  Shortness of Breath Severity:  Mild Onset quality:  Unable to specify Timing:  Unable to specify Progression:  Unable to specify Chronicity:  New Relieved by:  Nothing Worsened by:  Nothing     Past Medical History:  Diagnosis Date   Depression    Schizophrenia [facility])     Patient Active Problem List   Diagnosis Date Noted   Alcohol abuse 03/21/2021   Substance-induced disorder (HCC) 01/02/2021   Schizophrenia, paranoid type (HCC) 10/24/2020   Paranoid schizophrenia (HCC) 01/05/2020   Other schizophrenia (HCC) 02/14/2018   Cannabis use disorder, moderate, dependence (HCC) 05/10/2016    Past Surgical History:  Procedure Laterality Date   BACK SURGERY         No family history on file.  Social History   Tobacco Use   Smoking status: Some Days    Pack years: 0.00    Types: Cigarettes   Smokeless tobacco: Never  Vaping Use   Vaping Use: Never used  Substance Use Topics   Alcohol use: No   Drug use: Yes    Types: Marijuana    Comment: rare    Home Medications Prior to Admission medications   Medication Sig Start Date End Date Taking? Authorizing Provider  hydrOXYzine (ATARAX/VISTARIL) 25 MG tablet Take 1 tablet (25 mg total) by mouth every 8 (eight) hours as needed (Shaking and anxiety). Patient not taking: Reported on  03/21/2021 03/11/21   Bethann Berkshire, MD  paliperidone (INVEGA) 6 MG 24 hr tablet Take 1 tablet (6 mg total) by mouth daily for 14 days. Patient not taking: No sig reported 01/02/21 02/22/21  Mare Ferrari, PA-C    Allergies    Shellfish allergy and Bee pollen  Review of Systems   Review of Systems  Respiratory:  Positive for shortness of breath.   All other systems reviewed and are negative.  Physical Exam Updated Vital Signs BP 117/78 (BP Location: Right Arm)   Pulse 62   Temp 98.9 F (37.2 C) (Oral)   Resp 16   Ht 5\' 5"  (1.651 m)   Wt 74.8 kg   SpO2 100%   BMI 27.46 kg/m   Physical Exam Vitals and nursing note reviewed.  Constitutional:      General: He is not in acute distress.    Appearance: Normal appearance. He is well-developed.  HENT:     Head: Normocephalic and atraumatic.  Eyes:     Conjunctiva/sclera: Conjunctivae normal.     Pupils: Pupils are equal, round, and reactive to light.  Cardiovascular:     Rate and Rhythm: Normal rate and regular rhythm.     Heart sounds: Normal heart sounds.  Pulmonary:     Effort: Pulmonary effort is normal. No respiratory distress.     Breath sounds: Normal breath sounds.  Abdominal:  General: There is no distension.     Palpations: Abdomen is soft.     Tenderness: There is no abdominal tenderness.  Musculoskeletal:        General: No deformity. Normal range of motion.     Cervical back: Normal range of motion and neck supple.  Skin:    General: Skin is warm and dry.  Neurological:     General: No focal deficit present.     Mental Status: He is alert. He is disoriented.    ED Results / Procedures / Treatments   Labs (all labs ordered are listed, but only abnormal results are displayed) Labs Reviewed - No data to display  EKG None  Radiology No results found.  Procedures Procedures   Medications Ordered in ED Medications  albuterol (VENTOLIN HFA) 108 (90 Base) MCG/ACT inhaler 2 puff (2 puffs Inhalation  Given 04/05/21 1725)    ED Course  I have reviewed the triage vital signs and the nursing notes.  Pertinent labs & imaging results that were available during my care of the patient were reviewed by me and considered in my medical decision making (see chart for details).    MDM Rules/Calculators/A&P                          MDM  MSE complete  [name] was evaluated in Emergency Department on 04/05/2021 for the symptoms described in the history of present illness. He was evaluated in the context of the global COVID-19 pandemic, which necessitated consideration that the patient might be at risk for infection with the SARS-CoV-2 virus that causes COVID-19. Institutional protocols and algorithms that pertain to the evaluation of patients at risk for COVID-19 are in a state of rapid change based on information released by regulatory bodies including the CDC and federal and state organizations. These policies and algorithms were followed during the patient's care in the ED.  Patient is requesting albuterol.  Patient is minimally symptomatic.  Patient without significant wheezing on exam patient with good air movement.  He is not hypoxic.  Patient without indication of significant acute asthma exacerbation.  Patient additionally did asked for a bus pass and [place] sandwich.  These were provided.  Patient understands need for close follow-up.  Strict return precautions given and understood.  Importance of close follow-up is stressed.    Final Clinical Impression(s) / ED Diagnoses Final diagnoses:  Mild asthma with exacerbation, unspecified whether persistent    Rx / DC Orders ED Discharge Orders     None        Wynetta Fines, MD 04/05/21 [phone]

## 2021-04-05 NOTE — ED Notes (Signed)
While walking to room, Pt reports not knowing the last time he took his psych medications and reports "needing a chill pill."

## 2021-04-05 NOTE — ED Triage Notes (Addendum)
Per EMS, [place] c/o SOB x "a couple months.  Denies pain.  EMS reported Pt started feeling better while in the air conditioning.   NAD noted.   Pt reports he is paranoid which is making him SOB.  Sts he needs a breathing treatment.

## 2021-04-09 ENCOUNTER — Emergency Department (HOSPITAL_COMMUNITY)
Admission: EM | Admit: 2021-04-09 | Discharge: 2021-04-09 | Disposition: A | Discharge status: Home / Self Care | Payer: Medicaid - Out of State | Attending: Emergency Medicine | Admitting: Emergency Medicine

## 2021-04-09 ENCOUNTER — Other Ambulatory Visit: Payer: Self-pay

## 2021-04-09 DIAGNOSIS — Z5321 Procedure and treatment not carried out due to patient leaving prior to being seen by health care provider: Secondary | ICD-10-CM | POA: Diagnosis not present

## 2021-04-09 DIAGNOSIS — R0602 Shortness of breath: Secondary | ICD-10-CM | POA: Diagnosis not present

## 2021-04-09 DIAGNOSIS — F419 Anxiety disorder, unspecified: Secondary | ICD-10-CM | POA: Insufficient documentation

## 2021-04-09 NOTE — ED Notes (Signed)
Pt stated he wanted to leave. Explained to patient to wait for a provider to come and check him out. No MD in the MD station. Pt stated he was leaving. Pt left.

## 2021-04-09 NOTE — ED Triage Notes (Signed)
Per EMS pt complaining of being scared and pt told EMS that he needed a breathing treatment. EMS gave pt a breathing treatment but pt stated he still wanted to come to ER to get checked out.   Albuterol treatment (1/3)           Bp 120/70, HR 70, 99% RA with EMS

## 2021-04-11 ENCOUNTER — Emergency Department (HOSPITAL_COMMUNITY)
Admission: EM | Admit: 2021-04-11 | Discharge: 2021-04-11 | Disposition: A | Discharge status: Home / Self Care | Payer: Medicaid Other | Attending: Emergency Medicine | Admitting: Emergency Medicine

## 2021-04-11 ENCOUNTER — Other Ambulatory Visit: Payer: Self-pay

## 2021-04-11 ENCOUNTER — Encounter (HOSPITAL_COMMUNITY): Payer: Self-pay

## 2021-04-11 DIAGNOSIS — F209 Schizophrenia, unspecified: Secondary | ICD-10-CM | POA: Diagnosis not present

## 2021-04-11 DIAGNOSIS — F17210 Nicotine dependence, cigarettes, uncomplicated: Secondary | ICD-10-CM | POA: Diagnosis not present

## 2021-04-11 NOTE — ED Provider Notes (Signed)
[name]-EMERGENCY DEPT Provider Note   CSN: [id number] Arrival date & time: 04/11/21  0131     History Chief Complaint  Patient presents with   Medical Clearance    Aaox4 male. Per EMS, they picked up pt from [name] for resp issue but when they arrived pt stated that he "needed his chill pill." Per EMS, pt is homeless and has hx of schizophrenia and he has not been taking his meds due to not being able to afford them. Pt currently calm and cooperative. Pt denies SI/HI.     [name] is a 30 y.o. male.  The history is provided by the patient.  Illness Location:  At [name] Quality:  Transported a SChizophrenic patient who is homeless who "wants a chill pill" Severity:  Mild Onset quality:  Gradual Duration:  12 months Timing:  Constant Progression:  Unchanged Chronicity:  Chronic Context:  Homeless and does not take his medication daily Relieved by:  Nothing Worsened by:  Nothing Ineffective treatments:  None Associated symptoms: no chest pain, no congestion, no cough, no diarrhea, no fever, no headaches, no loss of consciousness, no rash, no shortness of breath and no wheezing   Risk factors:  HomelessWell known to the ED for same.  No SI or HI no AH or VH     Past Medical History:  Diagnosis Date   Depression    Schizophrenia [facility])     Patient Active Problem List   Diagnosis Date Noted   Alcohol abuse 03/21/2021   Substance-induced disorder (HCC) 01/02/2021   Schizophrenia, paranoid type (HCC) 10/24/2020   Paranoid schizophrenia (HCC) 01/05/2020   Other schizophrenia (HCC) 02/14/2018   Cannabis use disorder, moderate, dependence (HCC) 05/10/2016    Past Surgical History:  Procedure Laterality Date   BACK SURGERY         History reviewed. No pertinent family history.  Social History   Tobacco Use   Smoking status: Some Days    Pack years: 0.00    Types: Cigarettes   Smokeless tobacco: Never  Vaping Use   Vaping Use: Never  used  Substance Use Topics   Alcohol use: No   Drug use: Yes    Types: Marijuana    Comment: rare    Home Medications Prior to Admission medications   Medication Sig Start Date End Date Taking? Authorizing Provider  hydrOXYzine (ATARAX/VISTARIL) 25 MG tablet Take 1 tablet (25 mg total) by mouth every 8 (eight) hours as needed (Shaking and anxiety). Patient not taking: Reported on 03/21/2021 03/11/21   Adrian Berkshire, MD  paliperidone (INVEGA) 6 MG 24 hr tablet Take 1 tablet (6 mg total) by mouth daily for 14 days. Patient not taking: No sig reported 01/02/21 02/22/21  Adrian Ferrari, PA-C    Allergies    Shellfish allergy and Bee pollen  Review of Systems   Review of Systems  Constitutional:  Negative for fever.  HENT:  Negative for congestion and drooling.   Eyes:  Negative for redness.  Respiratory:  Negative for cough, shortness of breath and wheezing.   Cardiovascular:  Negative for chest pain.  Gastrointestinal:  Negative for diarrhea.  Genitourinary:  Negative for difficulty urinating.  Musculoskeletal:  Negative for Warner stiffness.  Skin:  Negative for rash.  Neurological:  Negative for loss of consciousness and headaches.  Psychiatric/Behavioral:  Negative for agitation and behavioral problems.   All other systems reviewed and are negative.  Physical Exam Updated Vital Signs BP 118/71   Pulse  64   Temp (!) 97.4 F (36.3 C) (Oral)   Resp 19   Ht 5\' 5"  (1.651 m)   Wt 72.6 kg   SpO2 98%   BMI 26.63 kg/m   Physical Exam Vitals and nursing note reviewed.  Constitutional:      General: He is not in acute distress.    Appearance: Normal appearance.  HENT:     Head: Normocephalic and atraumatic.     Nose: Nose normal.  Eyes:     Conjunctiva/sclera: Conjunctivae normal.     Pupils: Pupils are equal, round, and reactive to light.  Cardiovascular:     Rate and Rhythm: Normal rate and regular rhythm.     Pulses: Normal pulses.     Heart sounds: Normal heart  sounds.  Pulmonary:     Effort: Pulmonary effort is normal.     Breath sounds: Normal breath sounds.  Abdominal:     General: Abdomen is flat. Bowel sounds are normal.     Palpations: Abdomen is soft.     Tenderness: There is no abdominal tenderness. There is no guarding.  Musculoskeletal:        General: Normal range of motion.     Cervical back: Normal range of motion and Warner supple.  Skin:    General: Skin is warm and dry.     Capillary Refill: Capillary refill takes less than 2 seconds.  Neurological:     General: No focal deficit present.     Mental Status: He is alert and oriented to person, place, and time.     Deep Tendon Reflexes: Reflexes normal.  Psychiatric:        Mood and Affect: Mood normal.        Behavior: Behavior normal.    ED Results / Procedures / Treatments   Labs (all labs ordered are listed, but only abnormal results are displayed) Labs Reviewed - No data to display  EKG None  Radiology No results found.  Procedures Procedures   Medications Ordered in ED Medications - No data to display  ED Course  I have reviewed the triage vital signs and the nursing notes.  Pertinent labs & imaging results that were available during my care of the patient were reviewed by me and considered in my medical decision making (see chart for details).   He's at his baseline.  Stable for discharge with close follow up.    [name] was evaluated in Emergency Department on 04/11/2021 for the symptoms described in the history of present illness. He was evaluated in the context of the global COVID-19 pandemic, which necessitated consideration that the patient might be at risk for infection with the SARS-CoV-2 virus that causes COVID-19. Institutional protocols and algorithms that pertain to the evaluation of patients at risk for COVID-19 are in a state of rapid change based on information released by regulatory bodies including the CDC and federal and state  organizations. These policies and algorithms were followed during the patient's care in the ED.     Final Clinical Impression(s) / ED Diagnoses Final diagnoses:  Schizophrenia, unspecified type (HCC)    Return for intractable cough, coughing up blood, fevers > 100.4 unrelieved by medication, shortness of breath, intractable vomiting, chest pain, shortness of breath, weakness, numbness, changes in speech, facial asymmetry, abdominal pain, passing out, Inability to tolerate liquids or food, cough, altered mental status or any concerns. No signs of systemic illness or infection. The patient is nontoxic-appearing on exam and vital signs are  within normal limits. I have reviewed the triage vital signs and the nursing notes. Pertinent labs & imaging results that were available during my care of the patient were reviewed by me and considered in my medical decision making (see chart for details). After history, exam, and medical workup I feel the patient has been appropriately medically screened and is safe for discharge home. Pertinent diagnoses were discussed with the patient. Patient was given return precautions.  Rx / DC Orders ED Discharge Orders     None        Catriona Dillenbeck, MD 04/11/21 4128

## 2021-04-11 NOTE — ED Notes (Signed)
Dr. Palumbo at bedside. 

## 2021-04-17 ENCOUNTER — Emergency Department (HOSPITAL_COMMUNITY)
Admission: EM | Admit: 2021-04-17 | Discharge: 2021-04-17 | Disposition: A | Discharge status: Home / Self Care | Payer: Medicaid - Out of State | Attending: Emergency Medicine | Admitting: Emergency Medicine

## 2021-04-17 ENCOUNTER — Encounter (HOSPITAL_COMMUNITY): Payer: Self-pay | Admitting: Emergency Medicine

## 2021-04-17 ENCOUNTER — Other Ambulatory Visit: Payer: Self-pay

## 2021-04-17 DIAGNOSIS — R0981 Nasal congestion: Secondary | ICD-10-CM | POA: Insufficient documentation

## 2021-04-17 DIAGNOSIS — F17210 Nicotine dependence, cigarettes, uncomplicated: Secondary | ICD-10-CM | POA: Insufficient documentation

## 2021-04-17 DIAGNOSIS — R0602 Shortness of breath: Secondary | ICD-10-CM | POA: Diagnosis present

## 2021-04-17 MED ORDER — AZELASTINE HCL 0.1 % NA SOLN: 2.0000 | Freq: Two times a day (BID) | NASAL | 0 refills | Status: DC

## 2021-04-17 NOTE — ED Triage Notes (Signed)
Pt came in with SOB. Sats 96%. No distress noted

## 2021-04-17 NOTE — ED Provider Notes (Signed)
[name] Provider Note   CSN: [id number] Arrival date & time: 04/17/21  0423     History Chief Complaint  Patient presents with   Shortness of Breath    [name] is a 30 y.o. male.  The history is provided by the patient.  Shortness of Breath Severity:  Mild Onset quality:  Gradual Duration: weekd. Timing:  Constant Progression:  Unchanged Chronicity:  New Context: not fumes and not occupational exposure   Relieved by:  Nothing Worsened by:  Nothing Ineffective treatments:  None tried Associated symptoms: no cough, no fever, no rash, no swollen glands, no vomiting and no wheezing   Risk factors: no recent alcohol use   Patient well known to this ED presents with nasal congestion.  No f/c/r.  No cough, no wheezes.      Past Medical History:  Diagnosis Date   Depression    Schizophrenia [facility])     Patient Active Problem List   Diagnosis Date Noted   Alcohol abuse 03/21/2021   Substance-induced disorder (HCC) 01/02/2021   Schizophrenia, paranoid type (HCC) 10/24/2020   Paranoid schizophrenia (HCC) 01/05/2020   Other schizophrenia (HCC) 02/14/2018   Cannabis use disorder, moderate, dependence (HCC) 05/10/2016    Past Surgical History:  Procedure Laterality Date   BACK SURGERY         History reviewed. No pertinent family history.  Social History   Tobacco Use   Smoking status: Some Days    Pack years: 0.00    Types: Cigarettes   Smokeless tobacco: Never  Vaping Use   Vaping Use: Never used  Substance Use Topics   Alcohol use: No   Drug use: Yes    Types: Marijuana    Comment: rare    Home Medications Prior to Admission medications   Medication Sig Start Date End Date Taking? Authorizing Provider  azelastine (ASTELIN) 0.1 % nasal spray Place 2 sprays into both nostrils 2 (two) times daily. Use in each nostril as directed 04/17/21  Yes Kreig Parson, MD  hydrOXYzine (ATARAX/VISTARIL) 25 MG tablet Take 1  tablet (25 mg total) by mouth every 8 (eight) hours as needed (Shaking and anxiety). Patient not taking: Reported on 03/21/2021 03/11/21   Bethann Berkshire, MD  paliperidone (INVEGA) 6 MG 24 hr tablet Take 1 tablet (6 mg total) by mouth daily for 14 days. Patient not taking: No sig reported 01/02/21 02/22/21  Mare Ferrari, PA-C    Allergies    Shellfish allergy and Bee pollen  Review of Systems   Review of Systems  Constitutional:  Negative for fever.  HENT:  Positive for congestion. Negative for drooling.   Eyes:  Negative for redness.  Respiratory:  Negative for cough, wheezing and stridor.   Cardiovascular:  Negative for leg swelling.  Gastrointestinal:  Negative for vomiting.  Genitourinary:  Negative for difficulty urinating.  Musculoskeletal:  Negative for neck stiffness.  Skin:  Negative for rash.  Neurological:  Negative for facial asymmetry.  Psychiatric/Behavioral:  Negative for agitation.    Physical Exam Updated Vital Signs BP 133/68 (BP Location: Right Arm)   Pulse 70   Temp 98.7 F (37.1 C) (Oral)   Resp 16   Ht 5\' 5"  (1.651 m)   Wt 72.6 kg   SpO2 96%   BMI 26.63 kg/m   Physical Exam Vitals and nursing note reviewed.  Constitutional:      General: He is not in acute distress.    Appearance: Normal appearance.  HENT:  Head: Normocephalic and atraumatic.     Nose: Congestion present. No rhinorrhea.  Eyes:     Conjunctiva/sclera: Conjunctivae normal.     Pupils: Pupils are equal, round, and reactive to light.  Cardiovascular:     Rate and Rhythm: Normal rate and regular rhythm.     Pulses: Normal pulses.     Heart sounds: Normal heart sounds.  Pulmonary:     Effort: Pulmonary effort is normal. No respiratory distress.     Breath sounds: Normal breath sounds. No stridor. No wheezing or rales.  Abdominal:     General: Abdomen is flat. Bowel sounds are normal.     Palpations: Abdomen is soft.     Tenderness: There is no abdominal tenderness. There is no  guarding.  Musculoskeletal:        General: Normal range of motion.     Cervical back: Normal range of motion and neck supple.  Skin:    General: Skin is warm and dry.     Capillary Refill: Capillary refill takes less than 2 seconds.  Neurological:     General: No focal deficit present.     Mental Status: He is alert and oriented to person, place, and time.     Deep Tendon Reflexes: Reflexes normal.  Psychiatric:        Mood and Affect: Mood normal.        Behavior: Behavior normal.    ED Results / Procedures / Treatments   Labs (all labs ordered are listed, but only abnormal results are displayed) Labs Reviewed - No data to display  EKG None  Radiology No results found.  Procedures Procedures   Medications Ordered in ED Medications - No data to display  ED Course  I have reviewed the triage vital signs and the nursing notes.  Pertinent labs & imaging results that were available during my care of the patient were reviewed by me and considered in my medical decision making (see chart for details).  Will prescribe astelin nasal spray for congestion.  As patient is homeless, he has probably encountered pollen.    [name] was evaluated in Emergency Department on 04/17/2021 for the symptoms described in the history of present illness. He was evaluated in the context of the global COVID-19 pandemic, which necessitated consideration that the patient might be at risk for infection with the SARS-CoV-2 virus that causes COVID-19. Institutional protocols and algorithms that pertain to the evaluation of patients at risk for COVID-19 are in a state of rapid change based on information released by regulatory bodies including the CDC and federal and state organizations. These policies and algorithms were followed during the patient's care in the ED.  Final Clinical Impression(s) / ED Diagnoses Final diagnoses:  Nasal congestion   Return for intractable cough, coughing up blood,  fevers > 100.4 unrelieved by medication, shortness of breath, intractable vomiting, chest pain, shortness of breath, weakness, numbness, changes in speech, facial asymmetry, abdominal pain, passing out, Inability to tolerate liquids or food, cough, altered mental status or any concerns. No signs of systemic illness or infection. The patient is nontoxic-appearing on exam and vital signs are within normal limits. I have reviewed the triage vital signs and the nursing notes. Pertinent labs & imaging results that were available during my care of the patient were reviewed by me and considered in my medical decision making (see chart for details). After history, exam, and medical workup I feel the patient has been appropriately medically screened and is safe  for discharge home. Pertinent diagnoses were discussed with the patient. Patient was given return precautions.  Rx / DC Orders ED Discharge Orders          Ordered    azelastine (ASTELIN) 0.1 % nasal spray  2 times daily        04/17/21 0616             Rmani Kapusta, MD 04/17/21 1610

## 2021-04-21 ENCOUNTER — Encounter (HOSPITAL_COMMUNITY): Payer: Self-pay

## 2021-04-21 ENCOUNTER — Other Ambulatory Visit: Payer: Self-pay

## 2021-04-21 ENCOUNTER — Emergency Department (HOSPITAL_COMMUNITY)
Admission: EM | Admit: 2021-04-21 | Discharge: 2021-04-22 | Disposition: A | Discharge status: Home / Self Care | Payer: Medicaid Other | Attending: Emergency Medicine | Admitting: Emergency Medicine

## 2021-04-21 DIAGNOSIS — H53149 Visual discomfort, unspecified: Secondary | ICD-10-CM | POA: Diagnosis not present

## 2021-04-21 DIAGNOSIS — R0602 Shortness of breath: Secondary | ICD-10-CM | POA: Insufficient documentation

## 2021-04-21 DIAGNOSIS — Y900 Blood alcohol level of less than 20 mg/100 ml: Secondary | ICD-10-CM | POA: Diagnosis not present

## 2021-04-21 DIAGNOSIS — F17210 Nicotine dependence, cigarettes, uncomplicated: Secondary | ICD-10-CM | POA: Insufficient documentation

## 2021-04-21 DIAGNOSIS — R440 Auditory hallucinations: Secondary | ICD-10-CM | POA: Insufficient documentation

## 2021-04-21 DIAGNOSIS — R443 Hallucinations, unspecified: Secondary | ICD-10-CM

## 2021-04-21 LAB — CBC WITH DIFFERENTIAL/PLATELET
Abs Immature Granulocytes: 0.01 10*3/uL (ref 0.00–0.07)
Basophils Absolute: 0 10*3/uL (ref 0.0–0.1)
Basophils Relative: 1 %
Eosinophils Absolute: 0.2 10*3/uL (ref 0.0–0.5)
Eosinophils Relative: 4 %
HCT: 46.3 % (ref 39.0–52.0)
Hemoglobin: 15.7 g/dL (ref 13.0–17.0)
Immature Granulocytes: 0 %
Lymphocytes Relative: 52 %
Lymphs Abs: 2.7 10*3/uL (ref 0.7–4.0)
MCH: 31.7 pg (ref 26.0–34.0)
MCHC: 33.9 g/dL (ref 30.0–36.0)
MCV: 93.3 fL (ref 80.0–100.0)
Monocytes Absolute: 0.3 10*3/uL (ref 0.1–1.0)
Monocytes Relative: 6 %
Neutro Abs: 1.9 10*3/uL (ref 1.7–7.7)
Neutrophils Relative %: 37 %
Platelets: 238 10*3/uL (ref 150–400)
RBC: 4.96 MIL/uL (ref 4.22–5.81)
RDW: 12.8 % (ref 11.5–15.5)
WBC: 5.3 10*3/uL (ref 4.0–10.5)
nRBC: 0 % (ref 0.0–0.2)

## 2021-04-21 LAB — COMPREHENSIVE METABOLIC PANEL
ALT: 11 U/L (ref 0–44)
AST: 19 U/L (ref 15–41)
Albumin: 4.7 g/dL (ref 3.5–5.0)
Alkaline Phosphatase: 38 U/L (ref 38–126)
Anion gap: 7 (ref 5–15)
BUN: 21 mg/dL — ABNORMAL HIGH (ref 6–20)
CO2: 30 mmol/L (ref 22–32)
Calcium: 9.3 mg/dL (ref 8.9–10.3)
Chloride: 101 mmol/L (ref 98–111)
Creatinine, Ser: 1.17 mg/dL (ref 0.61–1.24)
GFR, Estimated: >60 mL/min (ref >60)
Glucose, Bld: 91 mg/dL (ref 70–99)
Potassium: 4.1 mmol/L (ref 3.5–5.1)
Sodium: 138 mmol/L (ref 135–145)
Total Bilirubin: 0.6 mg/dL (ref 0.3–1.2)
Total Protein: 7.3 g/dL (ref 6.5–8.1)

## 2021-04-21 LAB — RAPID URINE DRUG SCREEN, HOSP PERFORMED
Amphetamines: NOT DETECTED
Barbiturates: NOT DETECTED
Benzodiazepines: NOT DETECTED
Cocaine: NOT DETECTED
Opiates: NOT DETECTED
Tetrahydrocannabinol: NOT DETECTED

## 2021-04-21 LAB — ETHANOL: Alcohol, Ethyl (B): <10 mg/dL (ref <10)

## 2021-04-21 NOTE — ED Triage Notes (Signed)
Per EMS- patient c/o SOB and "can't see." Sats 97% on room air. EMS states the patient walked to the truck unassisted and when they held up 2 fingers, patient held up 2 fingers and stated that he could not see their fingers.

## 2021-04-21 NOTE — ED Provider Notes (Signed)
[facility]-EMERGENCY DEPT Provider Note   CSN: [id number] Arrival date & time: 04/21/21  1819     History Chief Complaint  Patient presents with   Shortness of Breath   visual issues    [name] is a 30 y.o. male with a history of schizophrenia and depression.  Patient presents emerged department with a chief plaint of shortness of breath and visual issues.  Patient reports that his shortness of breath started earlier today.  Patient denies any alleviating or aggravating factors.  States that shortness of breath is constant.  Patient denies any fevers, chills, cough, hemoptysis, unilateral leg swelling or tenderness, nasal congestion, rhinorrhea, sore throat.  Patient is unsure when his vision loss occurred.  States that vision loss is to both of his eyes.  Patient denies any head injury, eye injury, eye pain, eye discharge, neck pain, back pain, headache, facial asymmetry, slurred speech, numbness, weakness.  Patient initially denies any suicidal ideations, homicidal ideations, auditory hallucinations, or visual hallucinations.  On repeat examination patient states that he "needs help with reality."  Patient endorses auditory and visual hallucinations.  Patient states he sees "people," and is hearing "voices."  Patient does not give any further details.  Patient denies any commanding voices or voices telling him to do things.  Patient denies any illicit drug use or alcohol use.   Shortness of Breath Associated symptoms: no abdominal pain, no chest pain, no cough, no fever, no headaches, no neck pain, no rash and no vomiting       Past Medical History:  Diagnosis Date   Depression    Schizophrenia [facility])     Patient Active Problem List   Diagnosis Date Noted   Alcohol abuse 03/21/2021   Substance-induced disorder (HCC) 01/02/2021   Schizophrenia, paranoid type (HCC) 10/24/2020   Paranoid schizophrenia (HCC) 01/05/2020   Other schizophrenia (HCC)  02/14/2018   Cannabis use disorder, moderate, dependence (HCC) 05/10/2016    Past Surgical History:  Procedure Laterality Date   BACK SURGERY         History reviewed. No pertinent family history.  Social History   Tobacco Use   Smoking status: Some Days    Types: Cigarettes   Smokeless tobacco: Never  Vaping Use   Vaping Use: Never used  Substance Use Topics   Alcohol use: No   Drug use: Yes    Types: Marijuana    Comment: rare    Home Medications Prior to Admission medications   Medication Sig Start Date End Date Taking? Authorizing Provider  azelastine (ASTELIN) 0.1 % nasal spray Place 2 sprays into both nostrils 2 (two) times daily. Use in each nostril as directed 04/17/21   Palumbo, April, MD  hydrOXYzine (ATARAX/VISTARIL) 25 MG tablet Take 1 tablet (25 mg total) by mouth every 8 (eight) hours as needed (Shaking and anxiety). Patient not taking: Reported on 03/21/2021 03/11/21   Bethann Berkshire, MD  paliperidone (INVEGA) 6 MG 24 hr tablet Take 1 tablet (6 mg total) by mouth daily for 14 days. Patient not taking: No sig reported 01/02/21 02/22/21  Mare Ferrari, PA-C    Allergies    Shellfish allergy and Bee pollen  Review of Systems   Review of Systems  Constitutional:  Negative for chills and fever.  HENT:  Negative for congestion, rhinorrhea and trouble swallowing.   Eyes:  Positive for visual disturbance. Negative for photophobia, pain, discharge, redness and itching.  Respiratory:  Positive for shortness of breath. Negative for cough.  Cardiovascular:  Negative for chest pain, palpitations and leg swelling.  Gastrointestinal:  Negative for abdominal pain, nausea and vomiting.  Musculoskeletal:  Negative for back pain and neck pain.  Skin:  Negative for color change and rash.  Neurological:  Negative for dizziness, tremors, seizures, syncope, facial asymmetry, speech difficulty, weakness, light-headedness, numbness and headaches.  Psychiatric/Behavioral:   Positive for hallucinations. Negative for confusion and suicidal ideas.    Physical Exam Updated Vital Signs BP 127/60   Pulse 70   Temp 98.7 F (37.1 C) (Oral)   Resp 20   Ht 5' 5.5" (1.664 m)   Wt 74.8 kg   SpO2 97%   BMI 27.04 kg/m   Physical Exam Vitals and nursing note reviewed.  Constitutional:      General: He is not in acute distress.    Appearance: He is not ill-appearing, toxic-appearing or diaphoretic.  HENT:     Head: Normocephalic. No raccoon eyes, Battle's sign, abrasion, contusion, masses, right periorbital erythema, left periorbital erythema or laceration.  Eyes:     General: No scleral icterus.       Right eye: No discharge.        Left eye: No discharge.     Extraocular Movements: Extraocular movements intact.     Conjunctiva/sclera: Conjunctivae normal.     Pupils: Pupils are equal, round, and reactive to light.     Comments: Patient able to track provider without difficulty.  Able to perform finger-nose without difficulty.  Able to operate remote control for television without difficulty.  Cardiovascular:     Rate and Rhythm: Normal rate.  Pulmonary:     Effort: Pulmonary effort is normal. No tachypnea, bradypnea or respiratory distress.     Breath sounds: Normal breath sounds. No stridor.  Musculoskeletal:     Right lower leg: No swelling or tenderness. No edema.     Left lower leg: No swelling or tenderness. No edema.  Skin:    General: Skin is warm and dry.  Neurological:     General: No focal deficit present.     Mental Status: He is alert.  Psychiatric:        Behavior: Behavior is cooperative.    ED Results / Procedures / Treatments   Labs (all labs ordered are listed, but only abnormal results are displayed) Labs Reviewed  COMPREHENSIVE METABOLIC PANEL - Abnormal; Notable for the following components:      Result Value   BUN 21 (*)    All other components within normal limits  ETHANOL  RAPID URINE DRUG SCREEN, HOSP PERFORMED  CBC  WITH DIFFERENTIAL/PLATELET    EKG None  Radiology No results found.  Procedures Procedures   Medications Ordered in ED Medications - No data to display  ED Course  I have reviewed the triage vital signs and the nursing notes.  Pertinent labs & imaging results that were available during my care of the patient were reviewed by me and considered in my medical decision making (see chart for details).    MDM Rules/Calculators/A&P                          Alert 30 year old male no acute stress, nontoxic-appearing.  Patient presents emergency department chief complaint of shortness of breath and vision loss.  Lungs clear to auscultation bilaterally.  Patient speaks in full complete sentences without difficulty.  Oxygen saturation 96% or greater on room air.  Low suspicion for PE as patient can be  ruled out via [facility] criteria.  Patient reports vision loss however that this is subjective.  Patient able to actually track provider in room.  Able to perform finger-nose without difficulty, able to operate remote control without difficulty.  On repeat examination patient endorses auditory and visual hallucinations.  Patient has a history of schizophrenia.  Denies any suicidal or homicidal ideations.  Medical clearance labs were obtained.   UDS, ethanol, CBC are unremarkable. CMP shows BUN slightly elevated 21.  Patient medically cleared at this time.  Will place consult for TTS.  Patient here voluntarily.  Patient has no regularly prescribed medications to reorder.  Patient was discussed with and evaluated by Dr. Dalene Seltzer.   Final Clinical Impression(s) / ED Diagnoses Final diagnoses:  None    Rx / DC Orders ED Discharge Orders     None        Berneice Heinrich 04/21/21 2205    Alvira Monday, MD 04/22/21 [phone]

## 2021-04-21 NOTE — BH Assessment (Signed)
Comprehensive Clinical Assessment (CCA) Note  04/21/2021 [name] [id number]  Disposition: Melbourne Abts, PA, psych cleared. TTS clinician shared disposition with [facility] ACT Team Crisis Line, Fransico Setters will update patients ACT Team.   The patient demonstrates the following risk factors for suicide: Chronic risk factors for suicide include: psychiatric disorder of Schizophrenia, paranoid type and substance use disorder. Acute risk factors for suicide include: N/A. Protective factors for this patient include: positive therapeutic relationship. Considering these factors, the overall suicide risk at this point appears to be low. Patient is not appropriate for outpatient follow up.  Flowsheet Row ED from 04/21/2021 in [facility] ED from 04/17/2021 in [facility] Pre-admit (Canceled) from [facility]  C-SSRS RISK CATEGORY No Risk No Risk No Risk      1:1 risk  Sisto Granillo is a 30 year old male presenting voluntary to ED with complaint of shortness of breath. Patient also reported to EMS that couldn't see. When asked, who brought you to ED, patient stated "I am scared for my life", clinician asked, why, patient stated "I don't know". Patient stated "I was at a mental health hospital somewhere, but I don't know where, I would like to go back to it". Patient denied SI, HI, psychosis and alcohol/drug usage. Patient is poor historian. When asked assessment questions, patient mostly answered "I don't know, not sure". Patient does not appear intoxicated, delusional or responding to internal stimuli. Patient contracted for safety/  Per history, patient has an ACT Team at [facility].   Collateral contact completed with patients consent [facility] ACT team crisis line 1 [phone]. Spoke with Barnetta Chapel, whom stated she would inform his ACT Team. Disposition shared with [place]. Fransico Setters shared know  concerns regarding patient. Fransico Setters had no additional information regarding patient.   Chief Complaint:  Chief Complaint  Patient presents with   Shortness of Breath   visual issues   Visit Diagnosis:  Schizophrenia, paranoid type by hx  CCA Screening, Triage and Referral (STR)  Patient Reported Information How did you hear about [place]? Self  What Is the Reason for Your Visit/Call Today? "I am out of breath and scared for my life" How Long Has This Been Causing You Problems? > than 6 months  What Do You Feel Would Help You the Most Today? "I am out of breath and scared for my life"  Have You Recently Had Any Thoughts About Hurting Yourself? No  Are You Planning to Commit Suicide/Harm Yourself At This time? No  Have you Recently Had Thoughts About Hurting Someone [name]? No  Are You Planning to Harm Someone at This Time? No  Explanation: No data recorded  Have You Used Any Alcohol or Drugs in the Past 24 Hours? No  How Long Ago Did You Use Drugs or Alcohol? 0000 (Unclear)  What Did You Use and How Much? Pt denies, though he has a hx of consistent marijuana and crack-cocaine use   Do You Currently Have a Therapist/Psychiatrist? No (Per history, patient has an ACT Team.)  Name of Therapist/Psychiatrist: ACT Team- [facility]   Have You Been Recently Discharged From Any Public relations account executive or Programs? No  Explanation of Discharge From Practice/Program: No data recorded  CCA Biopsychosocial Patient Reported Schizophrenia/Schizoaffective Diagnosis in Past: Yes  Strengths: unable to assess  Mental Health Symptoms Depression:   None   Duration of Depressive symptoms:    Mania:   None   Anxiety:    Worrying; Tension  Psychosis:   Affective flattening/alogia/avolition   Duration of Psychotic symptoms:    Trauma:   None   Obsessions:   None   Compulsions:   None   Inattention:   None   Hyperactivity/Impulsivity:   N/A   Oppositional/Defiant Behaviors:    None   Emotional Irregularity:   None   Other Mood/Personality Symptoms:   None noted    Mental Status Exam Appearance and self-care  Stature:   Average   Weight:   Average weight   Clothing:   Age-appropriate; Casual   Grooming:   Normal   Cosmetic use:   None   Posture/gait:   Normal   Motor activity:   Not Remarkable   Sensorium  Attention:   Normal   Concentration:   Normal   Orientation:   -- (UTA)   Recall/memory:   Normal   Affect and Mood  Affect:   Appropriate   Mood:   Depressed   Relating  Eye contact:   Normal   Facial expression:   Sad; Tense   Attitude toward examiner:   Cooperative   Thought and Language  Speech flow:  Paucity   Thought content:   Appropriate to Mood and Circumstances   Preoccupation:   None   Hallucinations:   None   Organization:  No data recorded  [employer] of Knowledge:   Poor   Intelligence:   Needs investigation   Abstraction:   Abstract   Judgement:   Poor   Reality Testing:   Distorted   Insight:   Gaps   Decision Making:   Impulsive   Social Functioning  Social Maturity:   Impulsive   Social Judgement:   Heedless   Stress  Stressors:   Housing; Office manager Ability:   Overwhelmed   Skill Deficits:   Communication; Decision making; Self-care; Self-control   Supports:   Support needed    Religion: Religion/Spirituality Are You A Religious Person?:  (Not assessed) How Might This Affect Treatment?: Not assessed  Leisure/Recreation: Leisure / Recreation Do You Have Hobbies?:  (Not assessed)  Exercise/Diet: Exercise/Diet Do You Exercise?:  (Not assessed) Have You Gained or Lost A Significant Amount of Weight in the Past Six Months?:  (Not assessed) Do You Follow a Special Diet?:  (Not assessed) Do You Have Any Trouble Sleeping?:  (Not assessed)  CCA Employment/Education Employment/Work Situation: Employment / Work  Situation Employment Situation: On disability Patient's Job has Been Impacted by Current Illness:  (UTA) Has Patient ever Been in the [employer]?: No  Education: Education Last Grade Completed: 9 Did You Product manager?: No Did You Have An Individualized Education Program (IIEP): No Did You Have Any Difficulty At School?: No  CCA Family/Childhood History Family and Relationship History: Family history Does patient have children?: No  Childhood History:  Childhood History By whom was/is the patient raised?:  (Not assessed) Did patient suffer any verbal/emotional/physical/sexual abuse as a child?: No Has patient ever been sexually abused/assaulted/raped as an adolescent or adult?: No Witnessed domestic violence?: No Has patient been affected by domestic violence as an adult?: No  Child/Adolescent Assessment:   CCA Substance Use Alcohol/Drug Use: Alcohol / Drug Use Pain Medications: See MAR Prescriptions: See MAR Over the Counter: See MAR History of alcohol / drug use?: Yes (Pt denies SA) Longest period of sobriety (when/how long): Unknown Negative Consequences of Use:  (Pt denies SA) Withdrawal Symptoms:  (Pt denies SA)   ASAM's:  Six Dimensions of Multidimensional Assessment  Dimension 1:  Acute Intoxication and/or Withdrawal Potential:   Dimension 1:  Description of individual's past and current experiences of substance use and withdrawal: Pt reports that he use to smoke marijuana.  Dimension 2:  Biomedical Conditions and Complications:   Dimension 2:  Description of patient's biomedical conditions and  complications: Pt did not report medical  Dimension 3:  Emotional, Behavioral, or Cognitive Conditions and Complications:  Dimension 3:  Description of emotional, behavioral, or cognitive conditions and complications: Schizophrenia, Paranoid  Dimension 4:  Readiness to Change:  Dimension 4:  Description of Readiness to Change criteria: Pt reports that he is not taking  medicaiton  Dimension 5:  Relapse, Continued use, or Continued Problem Potential:  Dimension 5:  Relapse, continued use, or continued problem potential critiera description: precontemplation  Dimension 6:  Recovery/Living Environment:  Dimension 6:  Recovery/Iiving environment criteria description: Pt reports that he lives in different areas.  ASAM Severity Score: ASAM's Severity Rating Score: 6  ASAM Recommended Level of Treatment: ASAM Recommended Level of Treatment: Level I Outpatient Treatment   Substance use Disorder (SUD) Substance Use Disorder (SUD)  Checklist Symptoms of Substance Use: Continued use despite having a persistent/recurrent physical/psychological problem caused/exacerbated by use, Continued use despite persistent or recurrent social, interpersonal problems, caused or exacerbated by use  Recommendations for Services/Supports/Treatments: Recommendations for Services/Supports/Treatments Recommendations For Services/Supports/Treatments: Medication Management, Individual Therapy, Other (Comment) (Overnight observation)  Discharge Disposition:   DSM5 Diagnoses: Patient Active Problem List   Diagnosis Date Noted   Alcohol abuse 03/21/2021   Substance-induced disorder (HCC) 01/02/2021   Schizophrenia, paranoid type (HCC) 10/24/2020   Paranoid schizophrenia (HCC) 01/05/2020   Other schizophrenia (HCC) 02/14/2018   Cannabis use disorder, moderate, dependence (HCC) 05/10/2016   Referrals to Alternative Service(s): Referred to Alternative Service(s):   Place:   Date:   Time:    Referred to Alternative Service(s):   Place:   Date:   Time:    Referred to Alternative Service(s):   Place:   Date:   Time:    Referred to Alternative Service(s):   Place:   Date:   Time:     Burnetta Sabin, [facility]

## 2021-04-21 NOTE — ED Provider Notes (Signed)
Emergency Medicine Provider Triage Evaluation Note  [name] , a 30 y.o. male  was evaluated in triage.  Pt complains of shortness of breath and vision loss.  Is a history of schizophrenia and depression.  States he is not taking any medications at home.  Reports that shortness of breath started earlier today.  Patient denies any fevers, chills, cough, hemoptysis.  Patient endorses vision loss to bilateral eyes.  Patient is unsure when this started.  Patient denies any head injury, eye injury, eye pain, eye discharge.  Patient denies any suicidal ideations, homicidal ideations, auditory loose Nations, visual hallucinations.  Review of Systems  Positive: Vision loss, shortness of breath Negative: Eye pain, eye discharge, fevers, chills, cough, hemoptysis, leg swelling, leg tenderness  Physical Exam  BP 130/66 (BP Location: Right Arm)   Temp 98.7 F (37.1 C) (Oral)   Resp 16   Ht 5' 5.5" (1.664 m)   Wt 74.8 kg   SpO2 96%   BMI 27.04 kg/m  Gen:   Awake, no distress   Resp:  Normal effort, patient speaks in full complete sentences without difficulty.  Lungs clear to auscultation bilaterally. MSK:   Moves extremities without difficulty  Other:  Patient actively tracks provider around the room.  Able to perform finger-to-nose without any difficulty.  EOM intact bilaterally.  Medical Decision Making  Medically screening exam initiated at 7:09 PM.  Appropriate orders placed.  [name] was informed that the remainder of the evaluation will be completed by another provider, this initial triage assessment does not replace that evaluation, and the importance of remaining in the ED until their evaluation is complete.  The patient appears stable so that the remainder of the work up may be completed by another provider.       Haskel Schroeder, PA-C 04/21/21 1912    Alvira Monday, MD 04/22/21 [phone]

## 2021-04-21 NOTE — ED Notes (Signed)
TTS cart at bedside. 

## 2021-04-22 ENCOUNTER — Ambulatory Visit (HOSPITAL_COMMUNITY)
Admission: EM | Admit: 2021-04-22 | Discharge: 2021-04-23 | Disposition: A | Discharge status: Home / Self Care | Payer: Medicaid Other | Source: Home / Self Care

## 2021-04-22 ENCOUNTER — Other Ambulatory Visit: Payer: Self-pay

## 2021-04-22 DIAGNOSIS — F200 Paranoid schizophrenia: Secondary | ICD-10-CM

## 2021-04-22 NOTE — ED Notes (Signed)
[facility] called to transport pt home 

## 2021-04-22 NOTE — ED Notes (Signed)
Blue locker 

## 2021-04-22 NOTE — ED Notes (Signed)
From Debbe Odea:  Disposition: Melbourne Abts, PA, psych cleared. TTS clinician shared disposition with [facility] ACT Team Crisis Line, Fransico Setters will update patients ACT Team. ACT Team will follow up with patient in the AM.

## 2021-04-22 NOTE — Progress Notes (Signed)
TRIAGE: ROUTINE  Clinician made contact with pt's ACT Team, which is provided by [facility], by dialing [phone] and being transferred. Clinician spoke to [place] who stated that, if pt agrees to take his injection, he would be unable to get it until Monday. She states pt does continue to have his own apartment in [place] and that she didn't know what to do about pt being at the [facility]. She expressed an understanding that pt does not meet criteria to stay at the [facility].    04/22/21 2220  BHUC Triage Screening (Walk-ins at [facility] only)  How Did You Hear About [place]? Self  What Is the Reason for Your Visit/Call Today? Pt states he asked to come to the [facility] because he's, "Scared of life."  How Long Has This Been Causing You Problems? > than 6 months  Have You Recently Had Any Thoughts About Hurting Yourself? No  Are You Planning to Commit Suicide/Harm Yourself At This time? No  Have you Recently Had Thoughts About Hurting Someone [name]? No  Are You Planning To Harm Someone At This Time? No  Are you currently experiencing any auditory, visual or other hallucinations? No  Have You Used Any Alcohol or Drugs in the Past 24 Hours? No  What Did You Use and How Much? Pt denies, though he has a hx of consistent marijuana and crack-cocaine use. Of note, pt's UDA on 04/21/2021 was negative for all substances tested.  Do you have any current medical co-morbidities that require immediate attention? No  Clinician description of patient physical appearance/behavior: Pt is dressed age-appropriately in long jean shorts and a tank-top. Pt states he is tired, which is evident by pt closing his eyes consistently.  What Do You Feel Would Help You the Most Today? Housing Assistance  If access to [facility] Urgent Care was not available, would you have sought care in the Emergency Department? Yes  Determination of Need Routine (7 days)  Options For Referral Medication Management;Outpatient Therapy

## 2021-04-22 NOTE — Discharge Instructions (Signed)

## 2021-04-22 NOTE — ED Notes (Signed)
Patient given sandwich before discharge. No bus passes available at this time.

## 2021-04-22 NOTE — ED Provider Notes (Signed)
Behavioral Health Urgent Care Medical Screening Exam  Patient Name: [name] MRN: [id number] Date of Evaluation: 04/22/21 Chief Complaint:   Diagnosis:  Final diagnoses:  Paranoid schizophrenia (HCC)   History of Present illness: [name] is a 30 y.o. male with psychiatric histroy of paranoid schizophrenia. Patient presented voluntarily to [facility] with chief complaint of "I'm scared of my life and I need a place to sleep."  Patient was assessed by this NP. Patient is alert and oriented X4 , he is calm and cooperative. Patient's speech is clear and coherent, his mood is anxious, his affect is congruent with his mood. Patient's though process is coherent, there is no evidence of delusion or that patient is responding to any internal or external stimuli. Patient denies self-harming, suicidal and homicidal ideations, auditory hallucination, and visual hallucination. He denies recent substance abuse (his most recent UDS from 04/21/2021 was negative).    He denies any medical complaint. Per chart review, patient has been evaluated at various EDs 56 times in the past 6 months (He was evaluated and discharged from [facility] for SOB a few hours prior to this assessment). He denies chest pain/discomfort, SOB, respiratory distress, fever, headache,  gi/gu complaint.   He reports that he is "scared" to return to his apartment and states that he needs a place to sleep and rest. He is unable to provide any reason for his fear to return to his home. Patient has a history of paranoid schizophrenia and this appears to be his baseline. He reports that he is currently not taking any psychotropic medications and he was unable to confirm date of last LAI. Per chart review, patient is on [place] 156 mg IM (every 28 days)  administered by his ACT last on 03/01/2021.  TTS counselor contacted patient's ACT team.  Per TTS Counselor, Duard Brady, LMFT 04/22/2021 @23 :48: Clinician made contact with pt's ACT Team,  which is provided by [facility], by dialing [phone] and being transferred. Clinician spoke to [place] who stated that, if pt agrees to take his injection, he would be unable to get it until Monday. She states pt does continue to have his own apartment in [place] and that she didn't know what to do about pt being at the [facility]. She expressed an understanding that pt does not meet criteria to stay at the [facility].    Psychiatric Specialty Exam  Presentation  General Appearance:Appropriate for Environment  Eye Contact:Good  Speech:Clear and Coherent  Speech Volume:Normal  Handedness:Right   Mood and Affect  Mood:Euthymic  Affect:Congruent   Thought Process  Thought Processes:Coherent; Goal Directed  Descriptions of Associations:Intact  Orientation:Full (Time, Place and Person)  Thought Content:Logical  Diagnosis of Schizophrenia or Schizoaffective disorder in past: Yes  Duration of Psychotic Symptoms: Greater than six months  Hallucinations:None Someone is after me  Ideas of Reference:Paranoia  Suicidal Thoughts:No  Homicidal Thoughts:No   Sensorium  Memory:Immediate Fair; Recent Fair; Remote Poor  Judgment:Poor  Insight:Poor   Executive Functions  Concentration:Fair  Attention Span:Good  Recall:Fair  Fund of Knowledge:Fair  Language:Fair   Psychomotor Activity  Psychomotor Activity:Normal   Assets  Assets:Desire for Improvement; Housing; [facility]; Physical Health   Sleep  Sleep:Fair  Number of hours: -1   No data recorded  Physical Exam: Physical Exam Vitals and nursing note reviewed.  Constitutional:      General: He is not in acute distress.    Appearance: Normal appearance. He is well-developed. He is not ill-appearing, toxic-appearing or diaphoretic.  HENT:  Head: Normocephalic and atraumatic.  Eyes:     Conjunctiva/sclera: Conjunctivae normal.  Cardiovascular:     Rate and Rhythm: Normal rate and  regular rhythm.     Heart sounds: No murmur heard. Pulmonary:     Effort: Pulmonary effort is normal. No respiratory distress.     Breath sounds: Normal breath sounds.  Abdominal:     Palpations: Abdomen is soft.     Tenderness: There is no abdominal tenderness.  Musculoskeletal:        General: Normal range of motion.     Cervical back: Normal range of motion and neck supple.  Skin:    General: Skin is warm and dry.  Neurological:     Mental Status: He is alert and oriented to person, place, and time.  Psychiatric:        Attention and Perception: He is attentive. He does not perceive auditory or visual hallucinations.        Mood and Affect: Mood is anxious.        Speech: Speech normal.        Behavior: Behavior normal. Behavior is cooperative.        Thought Content: Thought content is paranoid. Thought content is not delusional. Thought content does not include homicidal or suicidal ideation. Thought content does not include homicidal or suicidal plan.   Review of Systems  Constitutional: Negative.   HENT: Negative.    Eyes: Negative.   Respiratory: Negative.    Cardiovascular: Negative.   Gastrointestinal: Negative.   Genitourinary: Negative.   Musculoskeletal: Negative.   Skin: Negative.   Neurological: Negative.   Endo/Heme/Allergies: Negative.   Psychiatric/Behavioral:  Negative for depression, hallucinations, substance abuse and suicidal ideas. The patient is nervous/anxious.   Blood pressure 122/60, pulse 63, temperature 98.7 F (37.1 C), temperature source Oral, resp. rate 18, SpO2 100 %. There is no height or weight on file to calculate BMI.  Musculoskeletal: Strength & Muscle Tone: within normal limits Gait & Station: normal Patient leans: Right   BHUC MSE Discharge Disposition for Follow up and Recommendations: Based on my evaluation the patient does not appear to have an emergency medical condition and can be discharged with resources and follow up care in  outpatient services for Medication Management and Individual Therapy  TTS contacted ACT to inform them about patient's presentation to [facility], multiple ED visits and to comfirm patient's apartment status. Informed that patient has an apartment is good condition in [place], [place] but continues to refuse to stay in his home. ACT also report that they would make arrangement to administer LAI on Monday if patient is willing to take his medication. Patient discharge in stable condition.    Maricela Bo, NP 04/22/2021, 11:52 PM

## 2021-04-22 NOTE — Discharge Instructions (Addendum)
Follow-up with your ACT team. Return here for new concerns.

## 2021-04-22 NOTE — ED Provider Notes (Signed)
  6:03 AM Cleared by psychiatry, will follow-up with his ACT team. Stable for discharge.   Garlon Hatchet, PA-C 04/22/21 [id number]    [name], MD 04/22/21 [phone]

## 2021-04-22 NOTE — ED Notes (Signed)
Patient given food and drink.

## 2021-04-22 NOTE — ED Notes (Signed)
Snack given upon request 

## 2021-04-23 NOTE — ED Notes (Addendum)
[employer] arrived to transport pt home. Pt discharged in no acute distress. A&O x4, ambulatory. Denied SI/HI/AVH. Verbalized understanding of AVS instructions reviewed by RN. Belongings returned to pt intact from "blue" locker. Pt escorted through [facility] without issue. Safety maintained.

## 2021-04-25 ENCOUNTER — Ambulatory Visit (HOSPITAL_COMMUNITY)
Admission: EM | Admit: 2021-04-25 | Discharge: 2021-04-25 | Disposition: A | Discharge status: Home / Self Care | Payer: Medicaid Other | Attending: Psychiatry | Admitting: Psychiatry

## 2021-04-25 DIAGNOSIS — F200 Paranoid schizophrenia: Secondary | ICD-10-CM | POA: Insufficient documentation

## 2021-04-25 NOTE — ED Notes (Signed)
Pt discharged in no acute distress. A&O x4, ambulatory. Verbalized understanding of AVS instructions reviewed by RN. Belongings returned to pt intact from "red" locker. Pt escorted to front lobby by staff. Safety maintained.

## 2021-04-25 NOTE — Progress Notes (Signed)
   04/25/21 0145  BHUC Triage Screening (Walk-ins at [facility] only)  How Did You Hear About [place]? Self  What Is the Reason for Your Visit/Call Today? [name] is a 30 y.o. male presenting voluntarily to the [facility] with a complaint of being tired and needing some sleep. Patient states "I am scared for my life". Patient denied that anyone was following him. Patient denies SI, HI, alcohol/drug usage and psychosis. Patient reports that he lives alone. Patient has had 74 [facility] visits in the past six months. Patient is alert and oriented x4 and is calm and cooperative. Patient medical record patient is receiving mental services from [facility] ACT team and is on LAI invega sustenna 156 mg IM. Per TTS note on 04/22/21 patient can receive his injection on 04/25/21.  How Long Has This Been Causing You Problems? > than 6 months  Have You Recently Had Any Thoughts About Hurting Yourself? No  Are You Planning to Commit Suicide/Harm Yourself At This time? No  Have you Recently Had Thoughts About Hurting Someone [name]? No  Are You Planning To Harm Someone At This Time? No  Are you currently experiencing any auditory, visual or other hallucinations? No  Have You Used Any Alcohol or Drugs in the Past 24 Hours? No  What Did You Use and How Much? none  Clinician description of patient physical appearance/behavior: normal  What Do You Feel Would Help You the Most Today? Housing Assistance  If access to [facility] Urgent Care was not available, would you have sought care in the Emergency Department? Yes  Determination of Need Routine (7 days)  Options For Referral Outpatient Therapy;Medication Management

## 2021-04-25 NOTE — Discharge Instructions (Signed)
  Discharge recommendations:  Patient is to take medications as prescribed. Please see information for follow-up appointment with psychiatry and therapy. Please follow up with your primary care provider for all medical related needs.  Please follow up with your ACT team for medication management.  Therapy: We recommend that patient participate in individual therapy to address mental health concerns.  Medications: The parent/guardian is to contact a medical professional and/or outpatient provider to address any new side effects that develop. Parent/guardian should update outpatient providers of any new medications and/or medication changes.   Atypical antipsychotics: If you are prescribed an atypical antipsychotic, it is recommended that your height, weight, BMI, blood pressure, fasting lipid panel, and fasting blood sugar be monitored by your outpatient providers.  Safety:  The patient should abstain from use of illicit substances/drugs and abuse of any medications. If symptoms worsen or do not continue to improve or if the patient becomes actively suicidal or homicidal then it is recommended that the patient return to the closest hospital emergency department, the [facility], or call 911 for further evaluation and treatment. National Suicide Prevention Lifeline 1-800-SUICIDE or (848) 722-0840.

## 2021-04-25 NOTE — ED Provider Notes (Signed)
Behavioral Health Urgent Care Medical Screening Exam  Patient Name: [name] MRN: [id number] Date of Evaluation: 04/25/21 Chief Complaint:  I am tired and need somewhere to sleep Diagnosis:  Final diagnoses:  Schizophrenia, paranoid (HCC)    History of Present illness: [name] is a 30 y.o. male with a history of paranoid schizophrenia who presented voluntarily to the [facility] with a complaint of being tired and needing some sleep. Patient reports that he lives alone. Patient has had 64 [place] visits in the past six months. Patient also reports that he was scared for his life, when asked of he was being threatened by anyone he stated no. Patient is alert and oriented x4 and is calm and cooperative. Patient denies any SI or HI or AVH.  Patient is currently receiving mental services from [facility] ACT team and is on LAI invega sustenna 156 mg IM. Patient stated he did not know when his last invega injection was. Per TTS note on 04/22/21 patient can receive his injection on 04/25/21.   Per TTS Counselor, Duard Brady, LMFT 04/22/2021 @23 :48: Clinician made contact with pt's ACT Team, which is provided by [facility], by dialing [phone] and being transferred. Clinician spoke to [place] who stated that, if pt agrees to take his injection, he would be unable to get it until Monday. She states pt does continue to have his own apartment in [facility] and that she didn't know what to do about pt being at the [facility]. She expressed an understanding that pt does not meet criteria to stay at the [facility].    Psychiatric Specialty Exam  Presentation  General Appearance:Disheveled  Eye Contact:Poor  Speech:Normal Rate  Speech Volume:Decreased  Handedness:Right   Mood and Affect  Mood:Euthymic  Affect:Blunt   Thought Process  Thought Processes:Coherent  Descriptions of Associations:Circumstantial  Orientation:Full (Time, Place and Person)  Thought Content:Logical  Diagnosis of  Schizophrenia or Schizoaffective disorder in past: Yes  Duration of Psychotic Symptoms: Greater than six months  Hallucinations:None Someone is after me  Ideas of Reference:Paranoia  Suicidal Thoughts:No  Homicidal Thoughts:No   Sensorium  Memory:Immediate Fair; Recent Fair; Remote Poor  Judgment:Poor  Insight:Poor   Executive Functions  Concentration:Fair  Attention Span:Fair  Recall:Fair  Fund of Knowledge:Fair  Language:Fair   Psychomotor Activity  Psychomotor Activity:Normal   Assets  Assets:Desire for Improvement; Housing; Physical Health; Resilience; Social Support   Sleep  Sleep:Fair  Number of hours: -1   No data recorded  Physical Exam: Physical Exam HENT:     Head: Normocephalic and atraumatic.     Nose: Nose normal.  Eyes:     Pupils: Pupils are equal, round, and reactive to light.  Cardiovascular:     Rate and Rhythm: Normal rate.  Pulmonary:     Effort: Pulmonary effort is normal.  Abdominal:     General: Abdomen is flat.  Musculoskeletal:        General: Normal range of motion.     Cervical back: Normal range of motion.  Neurological:     General: No focal deficit present.     Mental Status: He is alert.  Psychiatric:        Attention and Perception: Attention normal.        Mood and Affect: Affect is blunt and flat.        Speech: Speech normal.        Behavior: Behavior normal.        Thought Content: Thought content is paranoid. Thought content does not  include suicidal ideation. Thought content does not include homicidal or suicidal plan.        Cognition and Memory: Cognition normal.        Judgment: Judgment is impulsive.   Review of Systems  Constitutional: Negative.   HENT: Negative.    Eyes: Negative.   Respiratory: Negative.    Cardiovascular: Negative.   Gastrointestinal: Negative.   Genitourinary: Negative.   Skin: Negative.   Neurological: Negative.   Endo/Heme/Allergies: Negative.   Blood pressure  118/63, pulse 67, temperature 97.6 F (36.4 C), temperature source Oral, resp. rate 16, SpO2 98 %. There is no height or weight on file to calculate BMI.  Musculoskeletal: Strength & Muscle Tone: within normal limits Gait & Station: normal Patient leans: N/A   [facility] MSE Discharge Disposition for Follow up and Recommendations: Based on my evaluation the patient does not appear to have an emergency medical condition and can be discharged with resources and follow up care in outpatient services for Medication Management and Individual Therapy.   Jasper Riling, NP 04/25/2021, 3:07 AM

## 2021-04-29 ENCOUNTER — Other Ambulatory Visit: Payer: Self-pay

## 2021-04-29 ENCOUNTER — Ambulatory Visit (HOSPITAL_COMMUNITY)
Admission: EM | Admit: 2021-04-29 | Discharge: 2021-04-29 | Disposition: A | Discharge status: Home / Self Care | Payer: Medicaid Other | Attending: Psychiatry | Admitting: Psychiatry

## 2021-04-29 DIAGNOSIS — Z5902 Unsheltered homelessness: Secondary | ICD-10-CM | POA: Insufficient documentation

## 2021-04-29 DIAGNOSIS — F200 Paranoid schizophrenia: Secondary | ICD-10-CM | POA: Insufficient documentation

## 2021-04-29 NOTE — ED Notes (Signed)
Patient received After visit summary and instructions to follow up with ACT TEAM. Patient received BUS TICKET. Denies SI/I and AVH at time of discharge.

## 2021-04-29 NOTE — Discharge Instructions (Addendum)
Take all of you medications as prescribed by your mental healthcare provider with t he ACT team. Call to schedule an appointment with the ACT team at [phone] as soon as possible.   Report any adverse effects and reactions from your medications to your outpatient provider promptly.  Do not engage in alcohol and or illegal drug use while on prescription medicines.  Keep all scheduled appointments. This is to ensure that you are getting refills on time and to avoid any interruption in your medication. If you are unable to keep an appointment call to reschedule.   Be sure to follow up with resources and follow ups given.  In the event of worsening symptoms call the crisis hotline at 19, 911, and or go to the nearest emergency department for appropriate evaluation and treatment of symptoms.  Follow-up with your primary care provider for your medical issues, concerns and or health care needs.

## 2021-04-29 NOTE — ED Provider Notes (Signed)
Behavioral Health Urgent Care Medical Screening Exam  Patient Name: [name] MRN: [id number] Date of Evaluation: 04/29/21 Chief Complaint:   Diagnosis:  Final diagnoses:  Paranoid schizophrenia (HCC)    History of Present illness: [name] is a 30 y.o. male Patient presents to the [facility] voluntarily as a walk-in accompanied by law enforcement with a chief complaint of  "I don't know what to do about life." Patient has a significant hx of Paranoid Schizophrenia and over 57 visits to the EDs and Behavioral Health Services in the past 6 months.   On approach, the patient begins smiling at this provider as he recognizes me from previous encounters. He is alert and oriented to person, and place. His mood is euthymic and affect is congruent. He is calm and cooperative. He denies SI. He denies HI. He denies AVH. He does not appear to be responding to internal stimuli. He is paranoid which is chronic.   Patient reports "I don't know what to do about life. I need medications for reality. When asked if he has been in contact with the ACT team regarding his medications to receive the Invega injection, he states "I don't need that."  When asked how did he arrive to the [facility], he states that he called the police because he was scared for his life. He states that he was at [employer] in [place]. He states that he has been living on the streets. When asked if he still has his apartment in [facility], he states "I don't like living there. I don't know what to do." When asked if he can stay with his aunt Ms. [name] he states, yes. This provider asked to contact Ms. [name] to pick him up from the [facility] and he declined. He states, "don't call her. I know where she lives. I need a bus pass to go there." Patient was encouraged to contact the ACT to discuss medication options. Patient was provided with a bus pass per request for  transportation.   Per chart review: Per TTS Counselor, Duard Brady, LMFT 04/22/2021 @23 :48: Clinician made contact with pt's ACT Team, which is provided by [facility], by dialing [phone] and being transferred. Clinician spoke to [place] who stated that, if pt agrees to take his injection, he would be unable to get it until Monday. She states pt does continue to have his own apartment in [facility] and that she didn't know what to do about pt being at the [facility]. She expressed an understanding that pt does not meet criteria to stay at the [facility].    Psychiatric Specialty Exam  Presentation  General Appearance:Appropriate for Environment  Eye Contact:Fleeting  Speech:Clear and Coherent  Speech Volume:Normal  Handedness:Right   Mood and Affect  Mood:Euthymic  Affect:Congruent   Thought Process  Thought Processes:Coherent  Descriptions of Associations:Circumstantial  Orientation:Full (Time, Place and Person)  Thought Content:Logical  Diagnosis of Schizophrenia or Schizoaffective disorder in past: Yes  Duration of Psychotic Symptoms: Greater than six months  Hallucinations:None Someone is after me  Ideas of Reference:Paranoia  Suicidal Thoughts:No  Homicidal Thoughts:No   Sensorium  Memory:Immediate Fair; Recent Poor; Remote Poor  Judgment:Intact  Insight:Present   Executive Functions  Concentration:Fair  Attention Span:Fair  Recall:Fair  Fund of Knowledge:Fair  Language:Fair   Psychomotor Activity  Psychomotor Activity:Normal   Assets  Assets:Communication Skills; Desire for Improvement; Physical Health; Social Support; Housing   Sleep  Sleep:Fair  Number of hours: -1  No data recorded  Physical Exam: Physical Exam Eyes:     Conjunctiva/sclera: Conjunctivae normal.  Cardiovascular:     Rate and Rhythm: Normal rate.  Pulmonary:     Effort: Pulmonary effort is normal.  Musculoskeletal:        General: Normal range of motion.      Cervical back: Normal range of motion.  Neurological:     Mental Status: He is alert.   Review of Systems  Constitutional: Negative.   HENT: Negative.    Eyes: Negative.   Respiratory: Negative.    Cardiovascular: Negative.   Genitourinary: Negative.   Musculoskeletal: Negative.   Skin: Negative.   Neurological: Negative.   Endo/Heme/Allergies: Negative.   Psychiatric/Behavioral:  Negative for hallucinations and suicidal ideas.   Blood pressure 138/77, pulse 63, temperature 98.7 F (37.1 C), temperature source Oral, resp. rate 16, SpO2 100 %. There is no height or weight on file to calculate BMI.  Musculoskeletal: Strength & Muscle Tone: within normal limits Gait & Station: normal Patient leans: N/A   [facility] MSE Discharge Disposition for Follow up and Recommendations: Based on my evaluation the patient does not appear to have an emergency medical condition and can be discharged with resources and follow up care in outpatient services for Medication Management, Individual Therapy, and Group Therapy Follow up with the ACT team for medication management.    Layla Barter, NP 04/29/2021, 6:33 PM

## 2021-05-01 ENCOUNTER — Other Ambulatory Visit: Payer: Self-pay

## 2021-05-01 ENCOUNTER — Emergency Department (HOSPITAL_COMMUNITY)
Admission: EM | Admit: 2021-05-01 | Discharge: 2021-05-02 | Disposition: A | Discharge status: Home / Self Care | Payer: Medicaid Other | Attending: Emergency Medicine | Admitting: Emergency Medicine

## 2021-05-01 ENCOUNTER — Encounter (HOSPITAL_COMMUNITY): Payer: Self-pay

## 2021-05-01 ENCOUNTER — Ambulatory Visit (HOSPITAL_COMMUNITY)
Admission: EM | Admit: 2021-05-01 | Discharge: 2021-05-01 | Disposition: A | Discharge status: Home / Self Care | Payer: Medicaid Other | Attending: Psychiatry | Admitting: Psychiatry

## 2021-05-01 ENCOUNTER — Emergency Department (HOSPITAL_COMMUNITY): Payer: Medicaid Other

## 2021-05-01 DIAGNOSIS — R0981 Nasal congestion: Secondary | ICD-10-CM | POA: Insufficient documentation

## 2021-05-01 DIAGNOSIS — Y900 Blood alcohol level of less than 20 mg/100 ml: Secondary | ICD-10-CM | POA: Diagnosis not present

## 2021-05-01 DIAGNOSIS — Z5900 Homelessness unspecified: Secondary | ICD-10-CM | POA: Insufficient documentation

## 2021-05-01 DIAGNOSIS — F419 Anxiety disorder, unspecified: Secondary | ICD-10-CM | POA: Insufficient documentation

## 2021-05-01 DIAGNOSIS — F17210 Nicotine dependence, cigarettes, uncomplicated: Secondary | ICD-10-CM | POA: Diagnosis not present

## 2021-05-01 DIAGNOSIS — R443 Hallucinations, unspecified: Secondary | ICD-10-CM | POA: Insufficient documentation

## 2021-05-01 DIAGNOSIS — F200 Paranoid schizophrenia: Secondary | ICD-10-CM | POA: Insufficient documentation

## 2021-05-01 DIAGNOSIS — R062 Wheezing: Secondary | ICD-10-CM

## 2021-05-01 DIAGNOSIS — R0602 Shortness of breath: Secondary | ICD-10-CM | POA: Diagnosis not present

## 2021-05-01 MED ORDER — ALBUTEROL SULFATE HFA 108 (90 BASE) MCG/ACT IN AERS
4.0000 | INHALATION_SPRAY | Freq: Once | RESPIRATORY_TRACT | Status: AC
  Administered 2021-05-02: 4 via RESPIRATORY_TRACT
  Filled 2021-05-01: qty 6.7

## 2021-05-01 MED ORDER — IPRATROPIUM BROMIDE HFA 17 MCG/ACT IN AERS
2.0000 | INHALATION_SPRAY | Freq: Once | RESPIRATORY_TRACT | Status: AC
  Administered 2021-05-02: 2 via RESPIRATORY_TRACT
  Filled 2021-05-01: qty 12.9

## 2021-05-01 NOTE — ED Notes (Addendum)
Pt discharged in no acute distress. A&O x4, ambulatory. Denied SI/HI/AVH. Verbalized understanding of AVS instructions reviewed by RN. Pt escorted to [facility] without issue. Belongings returned to pt intact from "pink" locker. [employer] to transport pt home. Safety maintained.

## 2021-05-01 NOTE — ED Notes (Signed)
[facility] called to transport pt home 

## 2021-05-01 NOTE — ED Provider Notes (Signed)
Behavioral Health Urgent Care Medical Screening Exam  Patient Name: [name] MRN: [id number] Date of Evaluation: 05/01/21 Chief Complaint:  I am scared for my life Diagnosis:  Final diagnoses:  Paranoid schizophrenia (HCC)    History of Present illness: [name] is a 30 y.o. male presenting voluntarily at [facility] with a history of paranoid schizophrenia. Patient is known to this provider and was most recently assessed on 04/25/21 with similar complaint. Patient is reporting that he is scared for his life, which is the same complaint that patient usually presents with at each  visit. Patient has had 58 ED visits in the last six months. Patient is lying down on the couch in the assessment room and reports that he just wants to get some sleep and that he is scared someone is after him. Patient states that he does not know who is after or why anyone would be after him. Patient states that he is homeless, when asked about his apartment and if he still has his apartment patient states that he does not know. Patient reports that he is not on any medications at this time. Patient currently receives ACTT services from [facility] and it is unclear if patient received his invega sustenna 156 mg IM injection on the 04/25/21. Patient reports that he does not know. Patient denies any SI/HI/AVH at this time.   Psychiatric Specialty Exam  Presentation  General Appearance:Disheveled; Casual  Eye Contact:Minimal  Speech:Clear and Coherent  Speech Volume:Normal  Handedness:Right   Mood and Affect  Mood:Euthymic  Affect:Congruent   Thought Process  Thought Processes:Coherent  Descriptions of Associations:Circumstantial  Orientation:Full (Time, Place and Person)  Thought Content:Tangential  Diagnosis of Schizophrenia or Schizoaffective disorder in past: Yes  Duration of Psychotic Symptoms: Greater than six months  Hallucinations:None Someone is after me  Ideas of  Reference:Paranoia  Suicidal Thoughts:No  Homicidal Thoughts:No   Sensorium  Memory:Immediate Fair; Remote Poor; Recent Poor  Judgment:Fair  Insight:Fair   Executive Functions  Concentration:Fair  Attention Span:Fair  Recall:Fair  Fund of Knowledge:Fair  Language:Fair   Psychomotor Activity  Psychomotor Activity:Normal   Assets  Assets:Communication Skills; Desire for Improvement; Financial Resources/Insurance; Housing; Physical Health; Social Support   Sleep  Sleep:Fair  Number of hours: -1   No data recorded  Physical Exam: Physical Exam HENT:     Head: Normocephalic and atraumatic.     Nose: Nose normal.  Eyes:     Pupils: Pupils are equal, round, and reactive to light.  Cardiovascular:     Rate and Rhythm: Normal rate.  Pulmonary:     Effort: Pulmonary effort is normal.  Abdominal:     General: Abdomen is flat.  Musculoskeletal:        General: Normal range of motion.     Cervical back: Normal range of motion.  Neurological:     Mental Status: He is alert. Mental status is at baseline.  Psychiatric:        Attention and Perception: He does not perceive auditory or visual hallucinations.        Mood and Affect: Affect is blunt and flat.        Speech: Speech normal.        Behavior: Behavior is cooperative.        Thought Content: Thought content is paranoid. Thought content does not include homicidal or suicidal ideation. Thought content does not include homicidal or suicidal plan.        Cognition and Memory: Memory is impaired.  Judgment: Judgment normal.   Review of Systems  Constitutional: Negative.   HENT: Negative.    Eyes: Negative.   Respiratory: Negative.    Cardiovascular: Negative.   Gastrointestinal: Negative.   Genitourinary: Negative.   Musculoskeletal: Negative.   Skin: Negative.   Neurological: Negative.   Endo/Heme/Allergies: Negative.   Psychiatric/Behavioral: Negative.    Blood pressure 110/80, pulse 75,  temperature 97.8 F (36.6 C), temperature source Oral, resp. rate 16, SpO2 99 %. There is no height or weight on file to calculate BMI.  Musculoskeletal: Strength & Muscle Tone: within normal limits Gait & Station: normal Patient leans: N/A   [facility] MSE Discharge Disposition for Follow up and Recommendations: Based on my evaluation the patient does not appear to have an emergency medical condition and can be discharged with resources and follow up care in outpatient services for Medication Management and Individual Therapy   Jasper Riling, NP 05/01/2021, 2:04 AM

## 2021-05-01 NOTE — Progress Notes (Signed)
TRIAGE: ROUTINE    05/01/21 0045  [facility] Triage Screening (Walk-ins at [facility] only)  How Did You Hear About [place]? Self  What Is the Reason for Your Visit/Call Today? Acen Craun is a 30 year old patient who voluntarily presented at the [facility]) by the PD after he called 911 due to being "scared for my life." Pt states he "can't sit still" and that he doesn't want to be around people. Pt states, "I don't know" when asked if he still has his apartment in [facility] (clinician told pt he most likely does, as she called the emergency on-call number for pt's ACT Team last week when pt was here and they verified that he does); he states, "I don't know" when asked about the last time he saw/met with his ACT Team. Clinician explored with pt his ability to make good choices for himself, including taking his medication, to assist in him getting other services he wants. Pt expressed an understanding. Pt denies SI, HI, AVH, NSSIB, access to guns/weapons, or SA. Pt states he believes he has a court date for August 11, though he states he cannot remember what it is for.  How Long Has This Been Causing You Problems? > than 6 months  Have You Recently Had Any Thoughts About Hurting Yourself? No  Are You Planning to Commit Suicide/Harm Yourself At This time? No  Have you Recently Had Thoughts About [place]? No  Are You Planning To Harm Someone At This Time? No  Are you currently experiencing any auditory, visual or other hallucinations? No  Have You Used Any Alcohol or Drugs in the Past 24 Hours? No  What Did You Use and How Much? Pt denies  Do you have any current medical co-morbidities that require immediate attention? No  Clinician description of patient physical appearance/behavior: Pt is lying down in the room with the lights off. He answers many questions with "I don't know." Pt states he needs money and food.  What Do You Feel Would Help You the Most Today? Food  Assistance;Financial Resources  If access to [facility] was not available, would you have sought care in the Emergency Department? Yes  Determination of Need Routine (7 days)  Options For Referral Medication Management;Outpatient Therapy;Other: Comment (ACT Team)

## 2021-05-01 NOTE — ED Provider Notes (Signed)
[facility]-EMERGENCY DEPT Provider Note   CSN: [id number] Arrival date & time: 05/01/21  2248     History Chief Complaint  Patient presents with   Psychiatric Evaluation   Shortness of Breath    [name] is a 30 y.o. male with past medical history of depression, schizophrenia and homelessness that presents to the emerge department today for requesting psychiatric evaluation after hallucinations.  Patient states that he is hearing voices.  When I asked patient questions he states I do not know to every question.  He does admit to being slightly short of breath and wanting an evaluation of his lungs.  Patient appears extremely congested.  Per chart review patient has been seen multiple times for paranoid schizophrenia, was last seen in behavioral health urgent care today.  Patient has a 58 ED visits in the last 6 months.  Patient currently receives ACT services from [facility] .  Denies SI, HI at this time.  Does admit to hallucinations.  States I do not know when I asked him what the voices are telling him.  HPI     Past Medical History:  Diagnosis Date   Depression    Schizophrenia [facility])     Patient Active Problem List   Diagnosis Date Noted   Alcohol abuse 03/21/2021   Substance-induced disorder (HCC) 01/02/2021   Schizophrenia, paranoid type (HCC) 10/24/2020   Paranoid schizophrenia (HCC) 01/05/2020   Other schizophrenia (HCC) 02/14/2018   Cannabis use disorder, moderate, dependence (HCC) 05/10/2016    Past Surgical History:  Procedure Laterality Date   BACK SURGERY         History reviewed. No pertinent family history.  Social History   Tobacco Use   Smoking status: Some Days    Types: Cigarettes   Smokeless tobacco: Never  Vaping Use   Vaping Use: Never used  Substance Use Topics   Alcohol use: No   Drug use: Yes    Types: Marijuana    Comment: rare    Home Medications Prior to Admission medications   Medication Sig Start Date  End Date Taking? Authorizing Provider  azelastine (ASTELIN) 0.1 % nasal spray Place 2 sprays into both nostrils 2 (two) times daily. Use in each nostril as directed Patient not taking: No sig reported 04/17/21   Palumbo, April, MD  hydrOXYzine (ATARAX/VISTARIL) 25 MG tablet Take 1 tablet (25 mg total) by mouth every 8 (eight) hours as needed (Shaking and anxiety). Patient not taking: No sig reported 03/11/21   Bethann Berkshire, MD  paliperidone (INVEGA) 6 MG 24 hr tablet Take 1 tablet (6 mg total) by mouth daily for 14 days. Patient not taking: No sig reported 01/02/21 02/22/21  Mare Ferrari, PA-C    Allergies    Shellfish allergy and Bee pollen  Review of Systems   Review of Systems  Constitutional:  Negative for chills, diaphoresis, fatigue and fever.  HENT:  Negative for congestion, sore throat and trouble swallowing.   Eyes:  Negative for pain and visual disturbance.  Respiratory:  Positive for shortness of breath. Negative for cough and wheezing.   Cardiovascular:  Negative for chest pain, palpitations and leg swelling.  Gastrointestinal:  Negative for abdominal distention, abdominal pain, diarrhea, nausea and vomiting.  Genitourinary:  Negative for difficulty urinating.  Musculoskeletal:  Negative for back pain, neck pain and neck stiffness.  Skin:  Negative for pallor.  Neurological:  Negative for dizziness, speech difficulty, weakness and headaches.  Psychiatric/Behavioral:  Positive for hallucinations. Negative for  confusion.    Physical Exam Updated Vital Signs BP 122/65   Pulse 70   Temp 98.5 F (36.9 C) (Oral)   SpO2 95%   Physical Exam Constitutional:      General: He is not in acute distress.    Appearance: Normal appearance. He is not ill-appearing, toxic-appearing or diaphoretic.  HENT:     Nose: Congestion present.  Eyes:     Comments: Conjunctiva injected bilaterally.  Normal EOMs.  Cardiovascular:     Rate and Rhythm: Normal rate and regular rhythm.      Pulses: Normal pulses.  Pulmonary:     Effort: Pulmonary effort is normal.     Breath sounds: Wheezing present.     Comments: Patient does have some wheezes on exam, no respiratory distress, normal O2.  Patient is not using accessory muscle use. Musculoskeletal:        General: Normal range of motion.  Skin:    General: Skin is warm and dry.     Capillary Refill: Capillary refill takes less than 2 seconds.  Neurological:     General: No focal deficit present.     Mental Status: He is alert.     Comments: Patient will continue to the say I do not know when I asked him questions, however will follow commands.  Patient is alert to self, I do not the patient is altered, I think that patient will say I do not know before he even listens to the question. Alert. Clear speech. No facial droop. CNIII-XII grossly intact. Bilateral upper and lower extremities' sensation grossly intact. 5/5 symmetric strength with grip strength and with plantar and dorsi flexion bilaterally.  Normal finger to nose bilaterally. Negative pronator drift.     Psychiatric:        Mood and Affect: Mood is anxious.        Behavior: Behavior normal.        Thought Content: Thought content is paranoid.     Comments: Patient appears paranoid admitting to hallucinations.    ED Results / Procedures / Treatments   Labs (all labs ordered are listed, but only abnormal results are displayed) Labs Reviewed  RESP PANEL BY RT-PCR (FLU A&B, COVID) ARPGX2  COMPREHENSIVE METABOLIC PANEL  ETHANOL  RAPID URINE DRUG SCREEN, HOSP PERFORMED  CBC WITH DIFFERENTIAL/PLATELET    EKG None  Radiology DG Chest Portable 1 View  Result Date: 05/01/2021 CLINICAL DATA:  Short of breath. EXAM: PORTABLE CHEST 1 VIEW COMPARISON:  03/23/2021 FINDINGS: Cardiac silhouette is normal in size. Normal mediastinal and hilar contours. Clear lungs. No pleural effusion or pneumothorax. Skeletal structures are grossly intact. IMPRESSION: No active disease.  Electronically Signed   By: Amie Portland M.D.   On: 05/01/2021 23:31    Procedures Procedures   Medications Ordered in ED Medications  ipratropium (ATROVENT HFA) inhaler 2 puff (has no administration in time range)  albuterol (VENTOLIN HFA) 108 (90 Base) MCG/ACT inhaler 4 puff (has no administration in time range)    ED Course  I have reviewed the triage vital signs and the nursing notes.  Pertinent labs & imaging results that were available during my care of the patient were reviewed by me and considered in my medical decision making (see chart for details).    MDM Rules/Calculators/A&P                           Patient presents to the emerge department today for shortness  of breath and hallucinations.  Patient is not in any respiratory stress, however does have some mild wheezing on exam.  We will treat with albuterol and Atrovent, chest x-ray clear.  Think patient also states that he is short of breath because he is extremely congested in his nose.  Will obtain basic labs for medical screening and then patient will need to see psychiatry for hallucinations.Do not think that patient needs to be under IVC at this time.  Patient denies SI or HI.  Have high suspicion that patient is intoxicated from marijuana.  In regards to mental state, unsure what patient's baseline is, however patient does not have any focal neurodeficits.  Will need to be reevaluated.  Pt care was handed off to M. Mcdonald PA-C at 12.  Complete history and physical and current plan have been communicated.  Please refer to their note for the remainder of ED care and ultimate disposition.  Patient to be medically cleared and then awaiting psych evaluation for hallucinations.     Final Clinical Impression(s) / ED Diagnoses Final diagnoses:  Hallucinations    Rx / DC Orders ED Discharge Orders     None        Farrel Gordon, PA-C 05/01/21 2355    Gwyneth Sprout, MD 05/02/21 [phone]

## 2021-05-01 NOTE — ED Triage Notes (Signed)
Pt BIB EMS. [place] complaints of having shortness of breath for one day and wanting a psychiatric evaluation for history of schizophrenia.

## 2021-05-01 NOTE — Discharge Instructions (Signed)

## 2021-05-01 NOTE — ED Notes (Signed)
Pink locker

## 2021-05-02 ENCOUNTER — Ambulatory Visit (HOSPITAL_COMMUNITY)
Admission: AD | Admit: 2021-05-02 | Discharge: 2021-05-02 | Disposition: A | Discharge status: Home / Self Care | Payer: Medicaid Other | Attending: Psychiatry | Admitting: Psychiatry

## 2021-05-02 DIAGNOSIS — Z5900 Homelessness unspecified: Secondary | ICD-10-CM | POA: Insufficient documentation

## 2021-05-02 DIAGNOSIS — Z9114 Patient's other noncompliance with medication regimen: Secondary | ICD-10-CM | POA: Diagnosis not present

## 2021-05-02 DIAGNOSIS — F200 Paranoid schizophrenia: Secondary | ICD-10-CM | POA: Diagnosis present

## 2021-05-02 LAB — ETHANOL: Alcohol, Ethyl (B): <10 mg/dL (ref <10)

## 2021-05-02 LAB — COMPREHENSIVE METABOLIC PANEL
ALT: 11 U/L (ref 0–44)
AST: 18 U/L (ref 15–41)
Albumin: 4.3 g/dL (ref 3.5–5.0)
Alkaline Phosphatase: 32 U/L — ABNORMAL LOW (ref 38–126)
Anion gap: 7 (ref 5–15)
BUN: 16 mg/dL (ref 6–20)
CO2: 29 mmol/L (ref 22–32)
Calcium: 9.4 mg/dL (ref 8.9–10.3)
Chloride: 104 mmol/L (ref 98–111)
Creatinine, Ser: 1.08 mg/dL (ref 0.61–1.24)
GFR, Estimated: >60 mL/min (ref >60)
Glucose, Bld: 96 mg/dL (ref 70–99)
Potassium: 3.6 mmol/L (ref 3.5–5.1)
Sodium: 140 mmol/L (ref 135–145)
Total Bilirubin: 0.7 mg/dL (ref 0.3–1.2)
Total Protein: 6.8 g/dL (ref 6.5–8.1)

## 2021-05-02 NOTE — ED Notes (Signed)
Pt unable to provide a urine sample at this time.

## 2021-05-02 NOTE — ED Notes (Signed)
Pt requesting cheese and gingerale on d/c. Pt provided with 2 gingerale and 3 cheeses. Pt ambulatory with a steady gait on discharge. Pt is A&Ox4. Pt verbalizes understanding of d/c paperwork.

## 2021-05-02 NOTE — Discharge Instructions (Addendum)
Thank you for allowing me to care for you today in the Emergency Department.   You can use the albuterol inhaler, 2 puffs every 4 hours as needed for wheezing or shortness of breath.  Follow-up with your ACT team.  Return to the emergency department with new or worsening symptoms.

## 2021-05-02 NOTE — H&P (Signed)
Behavioral Health Medical Screening Exam  [name] is a 30 y.o. male presenting voluntarily to the [facility] with a history of paranoid schizophrenia stating, "I am scared for my life". Patient is alert oriented x3, calm and cooperative. Patient is known to this provider and was most recently assessed on 05/01/21 with similar complaint. Patient is reporting that he is scared for his life, which is the same complaint that patient usually presents with at each visit. Patient has had 60 ED visits in the last six months. Patient presented to the [facility] long ED earlier in the evening with a similar complaint and was discharged. Patient states that he is homeless, and that he wants to be admitted but he is not interested in taking any medications. Patient currently receives ACTT services from [facility] and it is unclear if patient is still receiving his invega sustenna 156 mg IM injections. It seems unlikely with the numerous presentations at various ED often on the same day. Patient denies any SI/HI but endorses AH. When he is asked to describe his AH his response is "I don't know".  Patient does not appear to be responding to any internal stimuli.  Total Time spent with patient: 30 minutes  Psychiatric Specialty Exam:  Presentation  General Appearance: Casual  Eye Contact:Fair  Speech:Clear and Coherent  Speech Volume:Normal  Handedness:Right   Mood and Affect  Mood:Euthymic  Affect:Congruent   Thought Process  Thought Processes:Disorganized  Descriptions of Associations:Circumstantial  Orientation:Full (Time, Place and Person)  Thought Content:Paranoid Ideation; Tangential  History of Schizophrenia/Schizoaffective disorder:Yes  Duration of Psychotic Symptoms:Greater than six months  Hallucinations:Hallucinations: Auditory Description of Auditory Hallucinations: Pt. states he does not know when asked about his AH  Ideas of Reference:Paranoia  Suicidal Thoughts:Suicidal Thoughts:  No  Homicidal Thoughts:Homicidal Thoughts: No   Sensorium  Memory:Immediate Poor; Recent Poor; Remote Poor  Judgment:Fair  Insight:Poor   Executive Functions  Concentration:Fair  Attention Span:Fair  Recall:Fair  Fund of Knowledge:Fair  Language:Fair   Psychomotor Activity  Psychomotor Activity:Psychomotor Activity: Normal   Assets  Assets:Financial Resources/Insurance; Housing; Physical Health; Social Support; Desire for Improvement   Sleep  Sleep:Sleep: Fair Number of Hours of Sleep: -2    Physical Exam: Physical Exam HENT:     Head: Normocephalic.  Eyes:     Pupils: Pupils are equal, round, and reactive to light.  Cardiovascular:     Rate and Rhythm: Normal rate.  Pulmonary:     Effort: Pulmonary effort is normal.  Abdominal:     Palpations: Abdomen is soft.  Musculoskeletal:        General: Normal range of motion.     Cervical back: Normal range of motion.  Skin:    General: Skin is warm.  Neurological:     Mental Status: He is alert and oriented to person, place, and time.  Psychiatric:        Attention and Perception: He perceives auditory hallucinations.        Mood and Affect: Affect is blunt and flat.        Speech: Speech normal.        Behavior: Behavior normal.        Thought Content: Thought content is paranoid.        Cognition and Memory: Cognition is impaired.        Judgment: Judgment is impulsive.   Review of Systems  Constitutional: Negative.   HENT: Negative.    Eyes: Negative.   Respiratory: Negative.    Cardiovascular: Negative.  Gastrointestinal: Negative.   Genitourinary: Negative.   Musculoskeletal: Negative.   Skin: Negative.   Neurological: Negative.   Endo/Heme/Allergies: Negative.   Psychiatric/Behavioral:  Positive for hallucinations.   Blood pressure 118/62, pulse 68, temperature 98.5 F (36.9 C), temperature source Oral, resp. rate 16, SpO2 98 %. There is no height or weight on file to calculate  BMI.  Musculoskeletal: Strength & Muscle Tone: within normal limits Gait & Station: normal Patient leans: N/A   Recommendations:  Based on my evaluation the patient does not appear to have an emergency medical condition. Based on my assessment and TTS assessment patient does not meet criteria for inpatient treatment. Patient instructed to follow up with his outpatient provider and ACT team [facility].   Jasper Riling, NP 05/02/2021, 3:15 AM

## 2021-05-02 NOTE — BH Assessment (Signed)
Comprehensive Clinical Assessment (CCA) Note  05/02/2021 [name] [id number]  DISPOSITION: Completed CCA accompanied by Roselyn Bering, NP who completed MSE and determined Pt does not meet criteria for inpatient psychiatric treatment. Recommendation is for Pt to contact his [name] for additional services and support.   The patient demonstrates the following risk factors for suicide: Chronic risk factors for suicide include: psychiatric disorder of schizophrenia . Acute risk factors for suicide include: N/A. Protective factors for this patient include: positive therapeutic relationship. Considering these factors, the overall suicide risk at this point appears to be low. Patient is appropriate for outpatient follow up.  Flowsheet Row OP [facility] from 05/01/2021 in [facility] Pre-admit (Canceled) from 02/22/2021 [facility]  C-SSRS RISK CATEGORY No Risk No Risk No Risk      Pt is a 30 year old single male who presents unaccompanied to [facility] immediately after being discharged from [facility]. Pt has a diagnosis of schizophrenia and a history of medication noncompliance. He has had approximately 60 presentations to EDs, [facility], and [facility] within the past six months and has been seen three times in the past 24 hours. He presents with the same symptoms as previous visits, stating that he is "scared for his life" and wanting a safe place to sleep. He cannot identify who night be following him or trying to harm him. Pt reports he is hearing voices that he cannot understand. He denies suicidal ideation or homicidal ideation. He denies alcohol or other substance use.  Pt reports he is homeless. He denies that he was at [facility] earlier tonight and says he was wandering the streets. He cannot identify anyone who is supportive. He denies legal problems. He denies access to  firearms. Pt cannot remember when he was last psychiatrically hospitalized.  Pt would not give permission to contact [name].  Pt is casually dressed, alert and oriented x4. Pt speaks in a soft tone, at low volume and normal pace. Motor behavior appears normal. Eye contact is staring. Pt's mood is anxious and affect is blunted Thought process is coherent with some thought blocking. Pt's insight is poor and judgment is fair. He says he wants to be admitted to a psychiatric hospital but does not want to take medications.   Chief Complaint: No chief complaint on file.  Visit Diagnosis: F20.9 Schizophrenia   CCA Screening, Triage and Referral (STR)  Patient Reported Information How did you hear about [place]? Self  Referral name: GPD/self  Referral phone number: 0 (N/A)   Whom do you see for routine medical problems? I don't have a doctor  Practice/Facility Name: No data recorded Practice/Facility Phone Number: No data recorded Name of Contact: No data recorded Contact Number: No data recorded Contact Fax Number: No data recorded Prescriber Name: No data recorded Prescriber Address (if known): No data recorded  What Is the Reason for Your Visit/Call Today? Pt walked to [facility] after being discharged from [facility]. He says he feels "scared for my life." He reports auditory hallucinations of voices he cannot understand. He says he wants to be admitted to a psychiatric hospital but does not want to take medication.  How Long Has This Been Causing You Problems? > than 6 months  What Do You Feel Would Help You the Most Today? Financial Resources; Food Assistance; Housing Assistance   Have You Recently Been in Any Inpatient Treatment (Hospital/Detox/Crisis Center/28-Day Program)? No  Name/Location of Program/Hospital:[facility]  How Long Were You There? Per chart, "2 days."  When Were You Discharged? 02/24/20 (Per chart.)   Have You Ever Received Services From [facility] Before?  Yes  Who Do You See at [facility]? Pt has previous ED, [facility] and [facility] visits.   Have You Recently Had Any Thoughts About Hurting Yourself? No  Are You Planning to Commit Suicide/Harm Yourself At This time? No   Have you Recently Had Thoughts About Hurting Someone [name]? No  Explanation: No data recorded  Have You Used Any Alcohol or Drugs in the Past 24 Hours? No  How Long Ago Did You Use Drugs or Alcohol? 0000 (Unclear)  What Did You Use and How Much? Pt denies   Do You Currently Have a Therapist/Psychiatrist? Yes  Name of Therapist/Psychiatrist: ACT[facility] services through [facility]   Have You Been Recently Discharged From Any Office Practice or Programs? No  Explanation of Discharge From Practice/Program: No data recorded    CCA Screening Triage Referral Assessment Type of Contact: Face-to-Face  Is this Initial or Reassessment? Initial Assessment  Date Telepsych consult ordered in [facility]:  02/24/21  Time Telepsych consult ordered in [facility]:  2014   Patient Reported Information Reviewed? Yes  Patient Left Without Being Seen? No  Reason for Not Completing Assessment: No data recorded  Collateral Involvement: Reviewed Pt's medical record   Does Patient Have a Court Appointed Legal Guardian? No data recorded Name and Contact of Legal Guardian: [name], legal guardian.   If Minor and Not Living with Parent(s), Who has Custody? N/A  Is CPS involved or ever been involved? Never  Is APS involved or ever been involved? In the past   Patient Determined To Be At Risk for Harm To Self or Others Based on Review of Patient Reported Information or Presenting Complaint? No  Method: No Plan  Availability of Means: No access or NA  Intent: Vague intent or NA  Notification Required: No need or identified person  Additional Information for Danger to Others Potential: Active psychosis  Additional Comments for Danger to Others Potential: History of children playing  outside triggering patient.  Are There Guns or Other Weapons in Your Home? No  Types of Guns/Weapons: No data recorded Are These Weapons Safely Secured?                            No data recorded Who Could Verify You Are Able To Have These Secured: No data recorded Do You Have any Outstanding Charges, Pending Court Dates, Parole/Probation? None noted  Contacted To Inform of Risk of Harm To Self or Others: Unable to Contact:   Location of Assessment: [facility]   Does Patient Present under Involuntary Commitment? No  IVC Papers Initial File Date: 10/22/20   [place] of Residence: [place]   Patient Currently Receiving the Following Services: ACTT Psychologist, educational)   Determination of Need: Routine (7 days)   Options For Referral: Outpatient Therapy; Medication Management     CCA Biopsychosocial Intake/Chief Complaint:  Patient stated he is "scared for his life." He further explained that he is "scared of life."  Patient also stated "I can't hardly breath."  Current Symptoms/Problems: Pt is wheezing. Patient denies SI, HI, NSSH and AVH, Patient does not appear intoxicated, delusional or responding to internal stimuli.   Patient Reported Schizophrenia/Schizoaffective Diagnosis in Past: Yes   Strengths: unable to assess  Preferences: Not assessed.  Abilities:  Not assessed.   Type of Services Patient Feels are Needed: Pt states he does not know   Initial Clinical Notes/Concerns: None noted   Mental Health Symptoms Depression:   None   Duration of Depressive symptoms:  Greater than two weeks   Mania:   None   Anxiety:    Worrying; Tension   Psychosis:   Affective flattening/alogia/avolition; Hallucinations   Duration of Psychotic symptoms:  Greater than six months   Trauma:   None   Obsessions:   None   Compulsions:   None   Inattention:   None   Hyperactivity/Impulsivity:   N/A   Oppositional/Defiant  Behaviors:   N/A   Emotional Irregularity:   N/A   Other Mood/Personality Symptoms:   None noted    Mental Status Exam Appearance and self-care  Stature:   Average   Weight:   Average weight   Clothing:   Age-appropriate; Casual   Grooming:   Normal   Cosmetic use:   None   Posture/gait:   Normal   Motor activity:   Not Remarkable   Sensorium  Attention:   Inattentive   Concentration:   Anxiety interferes   Orientation:   Person; Place; Situation; Time   Recall/memory:   Normal   Affect and Mood  Affect:   Blunted   Mood:   Anxious   Relating  Eye contact:   Staring   Facial expression:   Anxious; Tense   Attitude toward examiner:   Cooperative   Thought and Language  Speech flow:  Paucity   Thought content:   Appropriate to Mood and Circumstances   Preoccupation:   None   Hallucinations:   Auditory   Organization:  No data recorded  Affiliated Computer ServicesExecutive Functions  Fund of Knowledge:   Poor   Intelligence:   Needs investigation   Abstraction:   Concrete   Judgement:   Poor   Reality Testing:   Distorted   Insight:   Poor   Decision Making:   Impulsive   Social Functioning  Social Maturity:   Impulsive   Social Judgement:   Heedless   Stress  Stressors:   Housing; Office managerinancial   Coping Ability:   Overwhelmed   Skill Deficits:   Building services engineerCommunication; Decision making; Self-care; Self-control   Supports:   Support needed     Religion: Religion/Spirituality How Might This Affect Treatment?: Not assessed  Leisure/Recreation: Leisure / Recreation Do You Have Hobbies?: No  Exercise/Diet: Exercise/Diet Do You Exercise?: No Have You Gained or Lost A Significant Amount of Weight in the Past Six Months?: No Do You Follow a Special Diet?: No Do You Have Any Trouble Sleeping?: No   CCA Employment/Education Employment/Work Situation: Employment / Work Systems developerituation Employment Situation: On disability Why is Patient  on Disability: Mental health How Long has Patient Been on Disability: UTA Patient's Job has Been Impacted by Current Illness: No Has Patient ever Been in the [employer]. BancorpMilitary?: No  Education: Education Is Patient Currently Attending School?: No Last Grade Completed: 9 Did You Attend College?: No Did You Have An Individualized Education Program (IIEP): No Did You Have Any Difficulty At School?: No Patient's Education Has Been Impacted by Current Illness: No   CCA Family/Childhood History Family and Relationship History: Family history Marital status: Single Does patient have children?: No  Childhood History:  Childhood History By whom was/is the patient raised?: Mother Did patient suffer any verbal/emotional/physical/sexual abuse as a child?: No Did patient suffer from severe childhood neglect?: No Has patient  ever been sexually abused/assaulted/raped as an adolescent or adult?: No Was the patient ever a victim of a crime or a disaster?: No Witnessed domestic violence?: No Has patient been affected by domestic violence as an adult?: No  Child/Adolescent Assessment:     CCA Substance Use Alcohol/Drug Use: Alcohol / Drug Use Pain Medications: See MAR Prescriptions: See MAR Over the Counter: See MAR History of alcohol / drug use?: Yes Longest period of sobriety (when/how long): Unknown Substance #1 Name of Substance 1: Marijuana 1 - Age of First Use: UTA 1 - Amount (size/oz): Varied 1 - Frequency: Daily 1 - Duration: Ongoing 1 - Last Use / Amount: unknown 1 - Method of Aquiring: unknown 1- Route of Use: smoking Substance #2 Name of Substance 2: Alcohol 2 - Age of First Use: teens 2 - Amount (size/oz): varied 2 - Frequency: varied 2 - Duration: last few UDS's have been negative for ETOH. He reports drinking alcohol periodically. 2 - Last Use / Amount: Unknown 2 - Method of Aquiring: unknown 2 - Route of Substance Use: drinking                     ASAM's:   Six Dimensions of Multidimensional Assessment  Dimension 1:  Acute Intoxication and/or Withdrawal Potential:      Dimension 2:  Biomedical Conditions and Complications:      Dimension 3:  Emotional, Behavioral, or Cognitive Conditions and Complications:     Dimension 4:  Readiness to Change:     Dimension 5:  Relapse, Continued use, or Continued Problem Potential:     Dimension 6:  Recovery/Living Environment:     ASAM Severity Score:    ASAM Recommended Level of Treatment:     Substance use Disorder (SUD)    Recommendations for Services/Supports/Treatments:    DSM5 Diagnoses: Patient Active Problem List   Diagnosis Date Noted   Alcohol abuse 03/21/2021   Substance-induced disorder (HCC) 01/02/2021   Schizophrenia, paranoid type (HCC) 10/24/2020   Paranoid schizophrenia (HCC) 01/05/2020   Other schizophrenia (HCC) 02/14/2018   Cannabis use disorder, moderate, dependence (HCC) 05/10/2016    Patient Centered Plan: Patient is on the following Treatment Plan(s):  Anxiety   Referrals to Alternative Service(s): Referred to Alternative Service(s):   Place:   Date:   Time:    Referred to Alternative Service(s):   Place:   Date:   Time:    Referred to Alternative Service(s):   Place:   Date:   Time:    Referred to Alternative Service(s):   Place:   Date:   Time:     Pamalee Leyden, [facility]

## 2021-05-02 NOTE — ED Provider Notes (Signed)
30 year old male received in signout from [facility] [place] pending labs and re-evaluation. Per her HPI:   "[name] is a 30 y.o. male with past medical history of depression, schizophrenia and homelessness that presents to the emerge department today for requesting psychiatric evaluation after hallucinations.  Patient states that he is hearing voices.  When I asked patient questions he states I do not know to every question.  He does admit to being slightly short of breath and wanting an evaluation of his lungs.  Patient appears extremely congested.  Per chart review patient has been seen multiple times for paranoid schizophrenia, was last seen in behavioral health urgent care today.  Patient has a 58 ED visits in the last 6 months.  Patient currently receives ACT services from [place] .  Denies SI, HI at this time.  Does admit to hallucinations.  States I do not know when I asked him what the voices are telling him."  On my evaluation, the patient cannot recall why he presented to the emergency department.  He adamantly denies SI or HI.  He is endorsing hallucinations, but states that this is chronic.  Denies kill commands.  When I remind him of that he presented for shortness of breath, he states that he does not feel short of breath.  He would like to be left alone so that he can go back to sleep.   Physical Exam  BP 122/65   Pulse 70   Temp 98.5 F (36.9 C) (Oral)   Resp 20   SpO2 95%   Physical Exam Vitals and nursing note reviewed.  Constitutional:      General: He is not in acute distress.    Appearance: He is well-developed. He is not ill-appearing, toxic-appearing or diaphoretic.  HENT:     Head: Normocephalic.  Eyes:     Conjunctiva/sclera: Conjunctivae normal.     Comments: Bilateral eyes are injected.  No tearing or drainage.  Extraocular movements are intact.  Cardiovascular:     Rate and Rhythm: Normal rate and regular rhythm.     Pulses: Normal pulses.     Heart sounds: Normal  heart sounds. No murmur heard.   No friction rub. No gallop.  Pulmonary:     Effort: Pulmonary effort is normal.     Comments: Lungs are clear to auscultation bilaterally.  No increased work of breathing. Abdominal:     General: There is no distension.     Palpations: Abdomen is soft.  Musculoskeletal:     Cervical back: Neck supple.  Skin:    General: Skin is warm and dry.  Neurological:     Mental Status: He is alert.  Psychiatric:        Mood and Affect: Affect is flat.        Behavior: Behavior normal.        Thought Content: Thought content does not include homicidal or suicidal ideation. Thought content does not include homicidal or suicidal plan.     Comments: Cooperative.  Patient does not appear to be responding to internal stimuli.    ED Course/Procedures     Procedures  MDM   30 year old male with a history of cannabis use disorder, paranoid schizophrenia, and alcohol use disorder who presents to the emergency department with a chief complaint of shortness of breath.  Patient was received at signout pending labs and reevaluation as well as TTS consult.  Vital signs are stable.  Labs of been reviewed and independently interpreted by me.  Chest x-ray is unremarkable.  No metabolic derangements.  Ethanol level is not elevated.  On reevaluation, wheezing has resolved.  Oxygen saturation is 98% with good waveform on the monitor.  He has no tachycardia.  He denies any chest pain or shortness of breath.  Doubt ACS, PE, asthma exacerbation, acute CHF exacerbation, pneumonia, tension pneumothorax, or esophageal rupture.  The patient does endorse hallucinations, but states that they are chronic.  He adamantly denies SI or HI on my evaluation.  The patient was evaluated by TTS less than 24 hours ago.  I do not feel that he needs repeat TTS consult at this time since his symptoms seem unchanged from previous.  Of note, he has had 59 visits to the ED within the last 6 months.   At  this time, the patient appears hemodynamically stable and in no acute distress.  Safe for discharge to home with outpatient follow-up with his act team.  ER return precautions given.     Barkley Boards, PA-C 05/02/21 0158    Molpus, John, MD 05/02/21 0700

## 2021-05-03 ENCOUNTER — Other Ambulatory Visit: Payer: Self-pay

## 2021-05-03 ENCOUNTER — Emergency Department (HOSPITAL_COMMUNITY)
Admission: EM | Admit: 2021-05-03 | Discharge: 2021-05-03 | Disposition: A | Discharge status: Home / Self Care | Payer: Medicaid - Out of State | Attending: Emergency Medicine | Admitting: Emergency Medicine

## 2021-05-03 ENCOUNTER — Encounter (HOSPITAL_COMMUNITY): Payer: Self-pay

## 2021-05-03 DIAGNOSIS — Z5900 Homelessness unspecified: Secondary | ICD-10-CM | POA: Insufficient documentation

## 2021-05-03 DIAGNOSIS — F17210 Nicotine dependence, cigarettes, uncomplicated: Secondary | ICD-10-CM | POA: Insufficient documentation

## 2021-05-03 DIAGNOSIS — F419 Anxiety disorder, unspecified: Secondary | ICD-10-CM | POA: Diagnosis present

## 2021-05-03 MED ORDER — HYDROXYZINE HCL 25 MG PO TABS
50.0000 mg | ORAL_TABLET | Freq: Once | ORAL | Status: AC
  Administered 2021-05-03: 50 mg via ORAL
  Filled 2021-05-03: qty 2

## 2021-05-03 NOTE — ED Provider Notes (Signed)
[facility]   CSN: [id number] Arrival date & time: 05/03/21  1414     History Chief Complaint  Patient presents with   Anxiety    [name] is a 30 y.o. male.  HPI  Patient with significant medical history depression, schizophrenia, polysubstance abuse presents  due to complaint of anxiety.  Patient states that he did not know when his anxiety came on, he does not endorse feeling anxious, nervous, scared, denies heart palpitations, chest pain, shortness of breath, he denies illicit drug use.  Patient denies ever having this in the past, he denies hallucinations, delusions, suicidal homicidal ideations.  He has no other complaints at this time.  He is not endorse fevers, chills, chest pain, shortness breath, abdominal pain, nausea, vomiting, diarrhea.  After reviewing patient's chart patient has been seen here multiple times, just seen here yesterday, for possible hallucinations as well as shortness of breath, patient had a benign work-up, CBC CMP unremarkable, EKG sinus without signs of ischemia, chest x-ray negative for acute findings.  Past Medical History:  Diagnosis Date   Depression    Schizophrenia [facility])     Patient Active Problem List   Diagnosis Date Noted   Alcohol abuse 03/21/2021   Substance-induced disorder (HCC) 01/02/2021   Schizophrenia, paranoid type (HCC) 10/24/2020   Paranoid schizophrenia (HCC) 01/05/2020   Other schizophrenia (HCC) 02/14/2018   Cannabis use disorder, moderate, dependence (HCC) 05/10/2016    Past Surgical History:  Procedure Laterality Date   BACK SURGERY         History reviewed. No pertinent family history.  Social History   Tobacco Use   Smoking status: Some Days    Types: Cigarettes   Smokeless tobacco: Never  Vaping Use   Vaping Use: Never used  Substance Use Topics   Alcohol use: No   Drug use: Yes    Types: Marijuana    Comment: rare    Home Medications Prior  to Admission medications   Medication Sig Start Date End Date Taking? Authorizing Provider  paliperidone (INVEGA) 6 MG 24 hr tablet Take 1 tablet (6 mg total) by mouth daily for 14 days. Patient not taking: No sig reported 01/02/21 02/22/21  Mare Ferrari, PA-C    Allergies    Shellfish allergy and Bee pollen  Review of Systems   Review of Systems  Constitutional:  Negative for chills and fever.  HENT:  Negative for congestion.   Respiratory:  Negative for shortness of breath.   Cardiovascular:  Negative for chest pain.  Gastrointestinal:  Negative for abdominal pain.  Genitourinary:  Negative for enuresis.  Musculoskeletal:  Negative for back pain.  Skin:  Negative for rash.  Neurological:  Negative for dizziness.  Hematological:  Does not bruise/bleed easily.  Psychiatric/Behavioral:  Negative for confusion, hallucinations, self-injury and suicidal ideas.        Anxiety   Physical Exam Updated Vital Signs BP (!) 130/92 (BP Location: Right Arm)   Pulse 76   Temp 98 F (36.7 C)   Resp 18   Ht 5\' 5"  (1.651 m)   Wt 74.8 kg   SpO2 99%   BMI 27.46 kg/m   Physical Exam Vitals and nursing note reviewed.  Constitutional:      General: He is not in acute distress.    Appearance: He is not ill-appearing.  HENT:     Head: Normocephalic and atraumatic.     Comments: No deformities of the head present  Nose: No congestion.  Eyes:     Conjunctiva/sclera: Conjunctivae normal.  Neck:     Comments: No palpable nodules on patient's neck Cardiovascular:     Rate and Rhythm: Normal rate and regular rhythm.     Pulses: Normal pulses.     Heart sounds: No murmur heard.   No friction rub. No gallop.  Pulmonary:     Effort: No respiratory distress.     Breath sounds: No wheezing, rhonchi or rales.  Musculoskeletal:     Right lower leg: No edema.     Left lower leg: No edema.  Skin:    General: Skin is warm and dry.  Neurological:     Mental Status: He is alert.      Comments: No facial asymmetry, no difficulty with word finding, able to follow commands, no unilateral weakness  Psychiatric:        Mood and Affect: Mood normal.     Comments: Denies suicidal homicidal agents, hallucinations or delusions, does not appear to be responding to internal stimuli.    ED Results / Procedures / Treatments   Labs (all labs ordered are listed, but only abnormal results are displayed) Labs Reviewed  RESP PANEL BY RT-PCR (FLU A&B, COVID) [id number]    EKG None  Radiology DG Chest Portable 1 View  Result Date: 05/01/2021 CLINICAL DATA:  Short of breath. EXAM: PORTABLE CHEST 1 VIEW COMPARISON:  03/23/2021 FINDINGS: Cardiac silhouette is normal in size. Normal mediastinal and hilar contours. Clear lungs. No pleural effusion or pneumothorax. Skeletal structures are grossly intact. IMPRESSION: No active disease. Electronically Signed   By: Amie Portland M.D.   On: 05/01/2021 23:31    Procedures Procedures   Medications Ordered in ED Medications  hydrOXYzine (ATARAX/VISTARIL) tablet 50 mg (50 mg Oral Given 05/03/21 1854)    ED Course  I have reviewed the triage vital signs and the nursing notes.  Pertinent labs & imaging results that were available during my care of the patient were reviewed by me and considered in my medical decision making (see chart for details).    MDM Rules/Calculators/A&P                          Initial impression-patient presents with anxiety.  He is alert, does not appear in acute stress, vital signs reassuring.  Work-up-due to well-appearing patient, benign for exam, further lab or imaging not want at this time.  Reassessment-patient was reassessed after providing him with some Vistaril, states he still feeling anxious and scared, he also endorses that he needs " a chip in his head" to get him back to reality.  Concerns patient is having delusions at this time, will obtain med clearance work-up, and consult with TTS.  Patient is  reassessed and updated on TTS recommendations, he states that he is ready to go home, he does endorse that he is homeless needs help finding a place to stay.  Spoke with the social worker who will contact the patient and help him find suitable lodging.  He will also be given a bus pass and food.  Rule out-  Low suspicion for ACS as patient denies chest pain, shortness of breath, he has no cardiac history, EKG from yesterday was unremarkable, will defer EKG at this time. Low Suspicion for hyperthyroidism as patient is nontachycardic, no nodules palpated on neck.  Low suspicion for systemic infection as patient nontoxic-appearing, vital signs reassuring.  Low suspicion for psychiatric emergency as  patient was cleared by TTS.  Plan-  Anxiety-since resolved, will have him follow-up with his ACT team for further evaluation.  Gave him resources for outpatient counseling. Homelessness-provide him with social work contact information, provide him with resources to help him find homeless shelters.  Vital signs have remained stable, no indication for hospital admission.   Patient given at home care as well strict return precautions.  Patient verbalized that they understood agreed to said plan.  Final Clinical Impression(s) / ED Diagnoses Final diagnoses:  Anxiety    Rx / DC Orders ED Discharge Orders     None        Carroll Sage, PA-C 05/03/21 2042    Rolan Bucco, MD 05/06/21 [phone]

## 2021-05-03 NOTE — ED Triage Notes (Signed)
Pt to er, pt appears calm and is cooperative, pt states that he is here because he is scared and having a panic attack.  Pt denies chest pain, pt c/o back pain.

## 2021-05-03 NOTE — Discharge Instructions (Addendum)
Your exam was reassuring.  Please continue with all medications as prescribed.  Please call the social work for further help with finding lodging.[phone]  Come back to the emergency department if you develop chest pain, shortness of breath, severe abdominal pain, uncontrolled nausea, vomiting, diarrhea.

## 2021-05-03 NOTE — ED Triage Notes (Signed)
Per EMS, panic attack-history of the same-possibly paranoid

## 2021-05-03 NOTE — BH Assessment (Addendum)
Comprehensive Clinical Assessment (CCA) Note  05/03/2021 Dakari Cregger [id number]  Disposition: [name], PMHNP recommends pt is psych cleared, pt to follow up with [facility] ACT Team. Disposition discussed with Carroll Sage, PA-C and Leana Gamer, RN via secure chat in [place].   Flowsheet Row ED from 05/03/2021 in [facility] OP Visit from [facility] Pre-admit (Canceled) from 02/22/2021 [facility]  C-SSRS RISK CATEGORY Error: Question 6 not populated No Risk No Risk       The patient demonstrates the following risk factors for suicide: Chronic risk factors for suicide include: psychiatric disorder of Schizophrenia . Acute risk factors for suicide include: N/A. Protective factors for this patient include: positive therapeutic relationship. Considering these factors, the overall suicide risk at this point appears to be low. Patient is appropriate for outpatient follow up.  Revan Gendron is 30 year old male who presents voluntary and unaccompanied to [facility]. During the assessment pt was a poor historian and responded to most questions "I don't know." Clinician asked the pt, "what brought you to the hospital?" Pt reports, he's high off marijuana,scared for his life and needs a "chill pill." Pt was unable to detail when he last smoked marijuana and how much he smoked. Pt reports, hearing voices with no commands. Clinician asked what the voices say pt responded, "I don't know."  Pt denies, SI, HI, self-injurious behaviors.   Pt reports he's unsure if he has an ACT Team. Per chart, pt is linked to [facility] Team for medication management.   Pt presents alert with normal speech. Pt reports, he's homeless. Clinician expressed per chart he has an apartment. Pt's mood was anxious. Pt's affect was blunted. Pt's insight, judgement was poor. Pt reports, if discharged he can contract for  safety.   Diagnosis: Schizophrenia.  *Pt consented for clinician to contact his ACT Team [facility], [phone]) to obtain additional information. Clinician was transferred to [facility], Vocational Specialist, reports the pt was seen by her, the nurse and doctor today. Per Angelique, the pt has an apartment  but he doesn't like to be there. Per Angelique, pt is up to date on his Paliperidone Palmitate injection, pt received the injection every three months, pt's last injection was on 02/18/2021.*  Chief Complaint:  Chief Complaint  Patient presents with   Anxiety   Visit Diagnosis:     CCA Screening, Triage and Referral (STR)  Patient Reported Information How did you hear about [place]? Self  What Is the Reason for Your Visit/Call Today? Pt reports, he's scared for him life and wants to take a "chill pill."  How Long Has This Been Causing You Problems? > than 6 months  What Do You Feel Would Help You the Most Today? Treatment for Depression or other mood problem; Housing Assistance   Have You Recently Had Any Thoughts About Hurting Yourself? No  Are You Planning to Commit Suicide/Harm Yourself At This time? No   Have you Recently Had Thoughts About Hurting Someone [name]? No  Are You Planning to Harm Someone at This Time? No  Explanation: No data recorded  Have You Used Any Alcohol or Drugs in the Past 24 Hours? No  How Long Ago Did You Use Drugs or Alcohol? 0000 (Unclear)  What Did You Use and How Much? Pt reports, he's high on marijuana but does know how much he smoked or when he smoked.   Do You Currently Have a Therapist/Psychiatrist?  Yes  Name of Therapist/Psychiatrist: ACT Team with [facility].   Have You Been Recently Discharged From Any Office Practice or Programs? No  Explanation of Discharge From Practice/Program: No data recorded    CCA Screening Triage Referral Assessment Type of Contact: Tele-Assessment  Telemedicine Service Delivery: Telemedicine service  delivery: This service was provided via telemedicine using a 2-way, interactive audio and video technology  Is this Initial or Reassessment? Initial Assessment  Date Telepsych consult ordered in [facility]:  05/03/21  Time Telepsych consult ordered in [facility]:  1921  Location of Assessment: [facility]  Provider Location: [facility]   Collateral Involvement: Michaelle Copas, Vocational Specialist with [facility] Team, [phone].   Does Patient Have a Automotive engineer Guardian? No data recorded Name and Contact of Legal Guardian: [name], legal guardian.   If Minor and Not Living with Parent(s), Who has Custody? N/A  Is CPS involved or ever been involved? Never  Is APS involved or ever been involved? In the past   Patient Determined To Be At Risk for Harm To Self or Others Based on Review of Patient Reported Information or Presenting Complaint? No  Method: No Plan  Availability of Means: No access or NA  Intent: Vague intent or NA  Notification Required: No need or identified person  Additional Information for Danger to Others Potential: Active psychosis  Additional Comments for Danger to Others Potential: History of children playing outside triggering patient.  Are There Guns or Other Weapons in Your Home? No  Types of Guns/Weapons: No data recorded Are These Weapons Safely Secured?                            No data recorded Who Could Verify You Are Able To Have These Secured: No data recorded Do You Have any Outstanding Charges, Pending Court Dates, Parole/Probation? None noted  Contacted To Inform of Risk of Harm To Self or Others: Unable to Contact:    Does Patient Present under Involuntary Commitment? No  IVC Papers Initial File Date: 10/22/20   [place] of Residence: [place]   Patient Currently Receiving the Following Services: ACTT Psychologist, educational)   Determination of Need: Routine (7 days)   Options For Referral: Medication  Management; Outpatient Therapy     CCA Biopsychosocial Patient Reported Schizophrenia/Schizoaffective Diagnosis in Past: Yes   Strengths: Pt is linked to [employer] for ACT Team.   Mental Health Symptoms Depression:   Tearfulness (Pt responded, "I don't know," to most symptoms listed.)   Duration of Depressive symptoms:    Mania:   -- (Pt reports, "I don't know." When asked if the pt has experienced symptoms listed.)   Anxiety:    Worrying; Tension   Psychosis:   Affective flattening/alogia/avolition; Hallucinations   Duration of Psychotic symptoms:  Duration of Psychotic Symptoms: Greater than six months   Trauma:   -- (Pt reports, "I don't know." When asked if the pt has experienced symptoms listed.)   Obsessions:   -- (Pt reports, "I don't know." When asked if the pt has experienced symptoms listed.)   Compulsions:   -- (Pt reports, "I don't know." When asked if the pt has experienced symptoms listed.)   Inattention:   -- (Pt reports, "I don't know." When asked if the pt has experienced symptoms listed.)   Hyperactivity/Impulsivity:   -- (Pt reports, "I don't know." When asked if the pt has experienced symptoms listed.)   Oppositional/Defiant Behaviors:  None   Emotional Irregularity:   N/A   Other Mood/Personality Symptoms:   None noted    Mental Status Exam Appearance and self-care  Stature:   Average   Weight:   Average weight   Clothing:   Age-appropriate; Casual   Grooming:   Normal   Cosmetic use:   None   Posture/gait:   Normal   Motor activity:   Not Remarkable   Sensorium  Attention:   Inattentive   Concentration:   Normal   Orientation:   X5   Recall/memory:   Normal   Affect and Mood  Affect:   Blunted   Mood:   Anxious   Relating  Eye contact:   Staring   Facial expression:   Anxious; Tense   Attitude toward examiner:   Cooperative   Thought and Language  Speech flow:  Normal   Thought content:    Appropriate to Mood and Circumstances   Preoccupation:   None   Hallucinations:   Auditory   Organization:  No data recorded  Affiliated Computer ServicesExecutive Functions  Fund of Knowledge:   Poor   Intelligence:   Needs investigation   Abstraction:   Concrete   Judgement:   Poor   Reality Testing:   Distorted   Insight:   Poor   Decision Making:   Impulsive   Social Functioning  Social Maturity:   Impulsive   Social Judgement:   Heedless   Stress  Stressors:   Housing; Office managerinancial   Coping Ability:   Overwhelmed   Skill Deficits:   Building services engineerCommunication; Decision making; Self-care; Self-control   Supports:   Support needed     Religion: Religion/Spirituality Are You A Religious Person?:  (Pt responded, "I don't know.")  Leisure/Recreation: Leisure / Recreation Do You Have Hobbies?:  (Pt responded, "I don't know.")  Exercise/Diet: Exercise/Diet Do You Exercise?:  (Pt responded, "I don't know.") Do You Follow a Special Diet?:  (Pt responded, "I don't know.") Do You Have Any Trouble Sleeping?: No   CCA Employment/Education Employment/Work Situation: Employment / Work Systems developerituation Employment Situation: On disability Why is Patient on Disability: Mental health How Long has Patient Been on Disability: UTA Patient's Job has Been Impacted by Current Illness: No Has Patient ever Been in the U[employer]. BancorpMilitary?: No  Education: Education Is Patient Currently Attending School?: No Last Grade Completed: 9 Did You Attend College?: No Did You Have An Individualized Education Program (IIEP): No Did You Have Any Difficulty At School?: No   CCA Family/Childhood History Family and Relationship History: Family history Marital status: Single Does patient have children?: No  Childhood History:  Childhood History By whom was/is the patient raised?:  (UTA) Did patient suffer any verbal/emotional/physical/sexual abuse as a child?:  (Pt responded, "I don't know.") Did patient suffer from  severe childhood neglect?:  (UTA) Has patient ever been sexually abused/assaulted/raped as an adolescent or adult?:  (Pt responded, "I don't know.") Was the patient ever a victim of a crime or a disaster?:  (UTA) Witnessed domestic violence?:  (UTA)  Child/Adolescent Assessment:     CCA Substance Use Alcohol/Drug Use: Alcohol / Drug Use Pain Medications: See MAR Prescriptions: See MAR Over the Counter: See MAR History of alcohol / drug use?: Yes Longest period of sobriety (when/how long): Unknown Substance #1 Name of Substance 1: Marijuana 1 - Age of First Use: UTA 1 - Amount (size/oz): UTA 1 - Frequency: UTA 1 - Duration: UTA 1 - Last Use / Amount: UTA 1 - Method of Aquiring: UTA  1- Route of Use: Smoke.    ASAM's:  Six Dimensions of Multidimensional Assessment  Dimension 1:  Acute Intoxication and/or Withdrawal Potential:      Dimension 2:  Biomedical Conditions and Complications:      Dimension 3:  Emotional, Behavioral, or Cognitive Conditions and Complications:     Dimension 4:  Readiness to Change:     Dimension 5:  Relapse, Continued use, or Continued Problem Potential:     Dimension 6:  Recovery/Living Environment:     ASAM Severity Score:    ASAM Recommended Level of Treatment:     Substance use Disorder (SUD)    Recommendations for Services/Supports/Treatments: Recommendations for Services/Supports/Treatments Recommendations For Services/Supports/Treatments: ACCTT (Assertive Community Treatment)  Discharge Disposition:    DSM5 Diagnoses: Patient Active Problem List   Diagnosis Date Noted   Alcohol abuse 03/21/2021   Substance-induced disorder (HCC) 01/02/2021   Schizophrenia, paranoid type (HCC) 10/24/2020   Paranoid schizophrenia (HCC) 01/05/2020   Other schizophrenia (HCC) 02/14/2018   Cannabis use disorder, moderate, dependence (HCC) 05/10/2016     Referrals to Alternative Service(s): Referred to Alternative Service(s):   Place:   Date:    Time:    Referred to Alternative Service(s):   Place:   Date:   Time:    Referred to Alternative Service(s):   Place:   Date:   Time:    Referred to Alternative Service(s):   Place:   Date:   Time:     Redmond Pulling, [facility] Comprehensive Clinical Assessment (CCA) Screening, Triage and Referral Note  05/03/2021 Tod Abrahamsen [id number]  Chief Complaint:  Chief Complaint  Patient presents with   Anxiety   Visit Diagnosis:   Patient Reported Information How did you hear about [place]? Self  What Is the Reason for Your Visit/Call Today? Pt reports, he's scared for him life and wants to take a "chill pill."  How Long Has This Been Causing You Problems? > than 6 months  What Do You Feel Would Help You the Most Today? Treatment for Depression or other mood problem; Housing Assistance   Have You Recently Had Any Thoughts About Hurting Yourself? No  Are You Planning to Commit Suicide/Harm Yourself At This time? No   Have you Recently Had Thoughts About Hurting Someone [name]? No  Are You Planning to Harm Someone at This Time? No  Explanation: No data recorded  Have You Used Any Alcohol or Drugs in the Past 24 Hours? No  How Long Ago Did You Use Drugs or Alcohol? 0000 (Unclear)  What Did You Use and How Much? Pt reports, he's high on marijuana but does know how much he smoked or when he smoked.   Do You Currently Have a Therapist/Psychiatrist? Yes  Name of Therapist/Psychiatrist: ACT Team with [facility].   Have You Been Recently Discharged From Any Office Practice or Programs? No  Explanation of Discharge From Practice/Program: No data recorded   CCA Screening Triage Referral Assessment Type of Contact: Tele-Assessment  Telemedicine Service Delivery: Telemedicine service delivery: This service was provided via telemedicine using a 2-way, interactive audio and video technology  Is this Initial or Reassessment? Initial Assessment  Date Telepsych consult ordered in [facility]:   05/03/21  Time Telepsych consult ordered in [facility]:  1921  [facility]: [facility]  Provider Location: [facility]   Collateral Involvement: Michaelle Copas, Vocational Specialist with [facility] Team, [phone].   Does Patient Have a Automotive engineer Guardian? No data recorded Name and Contact of Legal  Guardian: [name], legal guardian.   If Minor and Not Living with Parent(s), Who has Custody? N/A  Is CPS involved or ever been involved? Never  Is APS involved or ever been involved? In the past   Patient Determined To Be At Risk for Harm To Self or Others Based on Review of Patient Reported Information or Presenting Complaint? No  Method: No Plan  Availability of Means: No access or NA  Intent: Vague intent or NA  Notification Required: No need or identified person  Additional Information for Danger to Others Potential: Active psychosis  Additional Comments for Danger to Others Potential: History of children playing outside triggering patient.  Are There Guns or Other Weapons in Your Home? No  Types of Guns/Weapons: No data recorded Are These Weapons Safely Secured?                            No data recorded Who Could Verify You Are Able To Have These Secured: No data recorded Do You Have any Outstanding Charges, Pending Court Dates, Parole/Probation? None noted  Contacted To Inform of Risk of Harm To Self or Others: Unable to Contact:   Does Patient Present under Involuntary Commitment? No  IVC Papers Initial File Date: 10/22/20   [place] of Residence: [place]   Patient Currently Receiving the Following Services: ACTT Psychologist, educational)   Determination of Need: Routine (7 days)   Options For Referral: Medication Management; Outpatient Therapy   Discharge Disposition:     Redmond Pulling, [facility]

## 2021-05-04 ENCOUNTER — Encounter (HOSPITAL_COMMUNITY): Payer: Self-pay

## 2021-05-04 ENCOUNTER — Other Ambulatory Visit: Payer: Self-pay

## 2021-05-04 ENCOUNTER — Emergency Department (HOSPITAL_COMMUNITY)
Admission: EM | Admit: 2021-05-04 | Discharge: 2021-05-04 | Disposition: A | Discharge status: Home / Self Care | Payer: Medicaid - Out of State | Attending: Emergency Medicine | Admitting: Emergency Medicine

## 2021-05-04 DIAGNOSIS — F17210 Nicotine dependence, cigarettes, uncomplicated: Secondary | ICD-10-CM | POA: Insufficient documentation

## 2021-05-04 DIAGNOSIS — F419 Anxiety disorder, unspecified: Secondary | ICD-10-CM | POA: Diagnosis present

## 2021-05-04 MED ORDER — HYDROXYZINE HCL 25 MG PO TABS
25.0000 mg | ORAL_TABLET | Freq: Once | ORAL | Status: AC
  Administered 2021-05-04: 25 mg via ORAL
  Filled 2021-05-04: qty 1

## 2021-05-04 NOTE — Discharge Instructions (Addendum)
Follow-up with your ACT team.

## 2021-05-04 NOTE — ED Triage Notes (Addendum)
Pt reports having a panic attack. Pt denies any other symptoms.   Albuterol neb given by EMS

## 2021-05-04 NOTE — ED Provider Notes (Signed)
Emergency Medicine Provider Triage Evaluation Note  [name] , a 30 y.o. male  was evaluated in triage.  Pt complains of having a panic attack. Has been seen multiple times for psychiatric complaints. NO SI or HI  Review of Systems  Positive: Panic attack Negative:   Physical Exam  BP 133/68 (BP Location: Left Arm)   Pulse 76   Temp 98.4 F (36.9 C) (Oral)   Resp 18   SpO2 100%  Gen:   Awake, no distress, appears very calm Resp:  Normal effort  MSK:   Moves extremities without difficulty  Other:    Medical Decision Making  Medically screening exam initiated at 8:10 PM.  Appropriate orders placed.  [name] was informed that the remainder of the evaluation will be completed by another provider, this initial triage assessment does not replace that evaluation, and the importance of remaining in the ED until their evaluation is complete.     Farrel Gordon, PA-C 05/04/21 2011    Rozelle Logan, DO 05/05/21 [phone]

## 2021-05-04 NOTE — ED Provider Notes (Signed)
[facility]-EMERGENCY DEPT Provider Note   CSN: [id number] Arrival date & time: 05/04/21  2001     History Chief Complaint  Patient presents with   Anxiety    [name] is a 30 y.o. male.  30 year old male with history of depression and schizophrenia presents with complaint of needing a chill pill for a panic attack that happened earlier today.  Patient does not know how long this lasted or what brought it on.  No suicidal homicidal complaints at this time.      Past Medical History:  Diagnosis Date   Depression    Schizophrenia [facility])     Patient Active Problem List   Diagnosis Date Noted   Alcohol abuse 03/21/2021   Substance-induced disorder (HCC) 01/02/2021   Schizophrenia, paranoid type (HCC) 10/24/2020   Paranoid schizophrenia (HCC) 01/05/2020   Other schizophrenia (HCC) 02/14/2018   Cannabis use disorder, moderate, dependence (HCC) 05/10/2016    Past Surgical History:  Procedure Laterality Date   BACK SURGERY         History reviewed. No pertinent family history.  Social History   Tobacco Use   Smoking status: Some Days    Types: Cigarettes   Smokeless tobacco: Never  Vaping Use   Vaping Use: Never used  Substance Use Topics   Alcohol use: No   Drug use: Yes    Types: Marijuana    Comment: rare    Home Medications Prior to Admission medications   Medication Sig Start Date End Date Taking? Authorizing Provider  paliperidone (INVEGA) 6 MG 24 hr tablet Take 1 tablet (6 mg total) by mouth daily for 14 days. Patient not taking: No sig reported 01/02/21 02/22/21  Mare Ferrari, PA-C    Allergies    Shellfish allergy and Bee pollen  Review of Systems   Review of Systems  Unable to perform ROS: Psychiatric disorder   Physical Exam Updated Vital Signs BP 133/68 (BP Location: Left Arm)   Pulse 76   Temp 98.4 F (36.9 C) (Oral)   Resp 18   SpO2 100%   Physical Exam Vitals and nursing note reviewed.   Constitutional:      General: He is not in acute distress.    Appearance: He is well-developed. He is not diaphoretic.  HENT:     Head: Normocephalic and atraumatic.     Mouth/Throat:     Mouth: Mucous membranes are moist.  Eyes:     Pupils: Pupils are equal, round, and reactive to light.  Cardiovascular:     Rate and Rhythm: Normal rate and regular rhythm.     Heart sounds: Normal heart sounds.  Pulmonary:     Effort: Pulmonary effort is normal.     Breath sounds: Normal breath sounds.  Musculoskeletal:     Cervical back: Normal range of motion.  Skin:    General: Skin is warm and dry.     Findings: No erythema.  Neurological:     General: No focal deficit present.     Mental Status: He is alert.  Psychiatric:        Mood and Affect: Affect is not flat.    ED Results / Procedures / Treatments   Labs (all labs ordered are listed, but only abnormal results are displayed) Labs Reviewed - No data to display  EKG None  Radiology No results found.  Procedures Procedures   Medications Ordered in ED Medications  hydrOXYzine (ATARAX/VISTARIL) tablet 25 mg (has no administration in time  range)    ED Course  I have reviewed the triage vital signs and the nursing notes.  Pertinent labs & imaging results that were available during my care of the patient were reviewed by me and considered in my medical decision making (see chart for details).  Clinical Course as of 05/04/21 2153  Wed May 04, 2021  [id number] 30 year old male with request for a "chill pill" after a panic attack which occurred earlier today.  Patient is a poor historian, often replies with "I do not know." Review of recent records, multiple presentations for same, most recently seen yesterday for same.  Patient was seen by behavioral health yesterday who reached out to patient's act team who reported patient is compliant with his every 3 month injection, last received on 02/2021, also reports patient is not  homeless, he has an apartment that he does not like to stay at. No emergent complaints at this time, patient is given hydroxyzine as requested and discharged to follow-up with his act team. [LM]    Clinical Course User Index [LM] Alden Hipp   MDM Rules/Calculators/A&P                            Final Clinical Impression(s) / ED Diagnoses Final diagnoses:  Anxiety    Rx / DC Orders ED Discharge Orders     None        Alden Hipp 05/04/21 2153    Ernie Avena, MD 05/04/21 2317

## 2021-05-05 ENCOUNTER — Other Ambulatory Visit: Payer: Self-pay

## 2021-05-05 ENCOUNTER — Emergency Department (HOSPITAL_COMMUNITY)
Admission: EM | Admit: 2021-05-05 | Discharge: 2021-05-06 | Disposition: A | Discharge status: Home / Self Care | Payer: Medicaid - Out of State | Attending: Emergency Medicine | Admitting: Emergency Medicine

## 2021-05-05 ENCOUNTER — Encounter (HOSPITAL_COMMUNITY): Payer: Self-pay

## 2021-05-05 DIAGNOSIS — R4182 Altered mental status, unspecified: Secondary | ICD-10-CM | POA: Insufficient documentation

## 2021-05-05 DIAGNOSIS — F209 Schizophrenia, unspecified: Secondary | ICD-10-CM | POA: Diagnosis not present

## 2021-05-05 DIAGNOSIS — F17210 Nicotine dependence, cigarettes, uncomplicated: Secondary | ICD-10-CM | POA: Diagnosis not present

## 2021-05-05 DIAGNOSIS — R4689 Other symptoms and signs involving appearance and behavior: Secondary | ICD-10-CM

## 2021-05-05 MED ORDER — HALOPERIDOL 5 MG PO TABS
5.0000 mg | ORAL_TABLET | Freq: Once | ORAL | Status: AC
  Administered 2021-05-05: 5 mg via ORAL
  Filled 2021-05-05: qty 1

## 2021-05-05 MED ORDER — LORAZEPAM 1 MG PO TABS
1.0000 mg | ORAL_TABLET | Freq: Once | ORAL | Status: AC
  Administered 2021-05-05: 1 mg via ORAL
  Filled 2021-05-05: qty 1

## 2021-05-05 NOTE — ED Triage Notes (Signed)
Patient denies SI/HI, alcohol or drug use.

## 2021-05-05 NOTE — ED Provider Notes (Signed)
Emergency Medicine Provider Triage Evaluation Note  [name] , a 29 y.o. male  was evaluated in triage.  Pt complains of fear for his life.  Patient has been seen multiple times recently for similar.  Patient denies any SI or HI.  I did MSE him yesterday.  Denies any substance use  Review of Systems  Positive: Fear for his life Negative: SI or HI  Physical Exam  There were no vitals taken for this visit. Gen:   Awake, no distress   Resp:  Normal effort  MSK:   Moves extremities without difficulty  Other:    Medical Decision Making  Medically screening exam initiated at 2:03 PM.  Appropriate orders placed.  [name] was informed that the remainder of the evaluation will be completed by another provider, this initial triage assessment does not replace that evaluation, and the importance of remaining in the ED until their evaluation is complete.     Farrel Gordon, PA-C 05/05/21 1404    Tilden Fossa, MD 05/07/21 [phone]
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
## 2021-05-05 NOTE — ED Triage Notes (Signed)
Patient [place] from train station, was requested by [employer] d/t patient stating he needs medication for his head and he fears for his life. EMS states he is paranoid w/ visual hallucinations.

## 2021-05-05 NOTE — ED Provider Notes (Signed)
[facility]-EMERG[facility] DEPT Provider Note   CSN: [id number] Arrival date & time: 05/05/21  1348     History No chief complaint on file.   [name] is a 30 y.o. male.  Patient is a 30 year old male with a history of depression, schizophrenia on IM medications, substance disorder who has an act team representative presenting today from the train station.  Patient was brought into by police per his urging.  He states his mind is messed up and he is feeling very paranoid.  He does not feel safe and feels like he just cannot think straight.  He denies using any alcohol or drugs today.  When asked what he did between yesterday and today because he has been in the emergency room almost every day in July he states he just does not know but his mind is just out of control.  He has not been back to his home which he does have an apartment.  He has no oral medications that he knows of.  When patient was seen in the emergency room yesterday.  He was given 1 dose of Ativan and then discharged.  Patient does hear things but denies visual hallucinations.  He complains of feeling paranoid but denies any SI or HI.  The history is provided by the patient and medical records.      Past Medical History:  Diagnosis Date   Depression    Schizophrenia [facility])     Patient Active Problem List   Diagnosis Date Noted   Alcohol abuse 03/21/2021   Substance-induced disorder (HCC) 01/02/2021   Schizophrenia, paranoid type (HCC) 10/24/2020   Paranoid schizophrenia (HCC) 01/05/2020   Other schizophrenia (HCC) 02/14/2018   Cannabis use disorder, moderate, dependence (HCC) 05/10/2016    Past Surgical History:  Procedure Laterality Date   BACK SURGERY         History reviewed. No pertinent family history.  Social History   Tobacco Use   Smoking status: Some Days    Types: Cigarettes   Smokeless tobacco: Never  Vaping Use   Vaping Use: Never used  Substance Use Topics   Alcohol  use: No   Drug use: Yes    Types: Marijuana    Comment: rare    Home Medications Prior to Admission medications   Medication Sig Start Date End Date Taking? Authorizing Provider  paliperidone (INVEGA) 6 MG 24 hr tablet Take 1 tablet (6 mg total) by mouth daily for 14 days. Patient not taking: No sig reported 01/02/21 02/22/21  Mare Ferrari, PA-C    Allergies    Shellfish allergy and Bee pollen  Review of Systems   Review of Systems  All other systems reviewed and are negative.  Physical Exam Updated Vital Signs BP 113/74 (BP Location: Left Arm)   Pulse 80   Temp 98.6 F (37 C) (Oral)   Resp 16   Ht 5\' 5"  (1.651 m)   Wt 74.8 kg   SpO2 98%   BMI 27.46 kg/m   Physical Exam Vitals and nursing note reviewed.  Constitutional:      General: He is not in acute distress.    Appearance: He is well-developed.     Comments: Anxious appearing  HENT:     Head: Normocephalic and atraumatic.  Eyes:     Conjunctiva/sclera: Conjunctivae normal.     Pupils: Pupils are equal, round, and reactive to light.  Cardiovascular:     Rate and Rhythm: Normal rate and regular rhythm.  Pulses: Normal pulses.     Heart sounds: No murmur heard. Pulmonary:     Effort: Pulmonary effort is normal. No respiratory distress.     Breath sounds: Normal breath sounds. No wheezing or rales.  Abdominal:     General: There is no distension.     Palpations: Abdomen is soft.     Tenderness: There is no abdominal tenderness. There is no guarding or rebound.  Musculoskeletal:        General: No tenderness. Normal range of motion.     Cervical back: Normal range of motion and neck supple.  Skin:    General: Skin is warm and dry.     Findings: No erythema or rash.  Neurological:     General: No focal deficit present.     Mental Status: He is alert and oriented to person, place, and time. Mental status is at baseline.  Psychiatric:        Attention and Perception: He is inattentive.        Mood and  Affect: Mood is anxious.        Speech: Speech is tangential.        Behavior: Behavior is cooperative.        Thought Content: Thought content is paranoid.     Comments: Does not seem to be responding to internal stimuli    ED Results / Procedures / Treatments   Labs (all labs ordered are listed, but only abnormal results are displayed) Labs Reviewed - No data to display  EKG None  Radiology No results found.  Procedures Procedures   Medications Ordered in ED Medications  LORazepam (ATIVAN) tablet 1 mg (1 mg Oral Given 05/05/21 1528)    ED Course  I have reviewed the triage vital signs and the nursing notes.  Pertinent labs & imaging results that were available during my care of the patient were reviewed by me and considered in my medical decision making (see chart for details).    MDM Rules/Calculators/A&P                           Patient with a mental health history who is currently on IM medications presenting to the clinic with complaints of his mind not being right and being paranoid.  Patient has been seen numerous times over the last several months and does have an act team representative.  Patient was in the emergency room yesterday and given Ativan.  He denies any SI or HI but reports feeling paranoid.  We will give patient 1 dose of Ativan.  At this time vital signs are normal.  No evidence of withdrawal.  Patient denies any substance use since leaving the hospital.  Will reevaluate after Ativan.  5:34 PM After ativan pt reports he is still paranoid.  Will give a dose of haldol.  11:37 PM Patient continues to complain of feeling paranoid with multiple somatic symptoms.  We will have TTS evaluate.  Currently he is calm and cooperative.  No need for involuntary commitment.  Final Clinical Impression(s) / ED Diagnoses Final diagnoses:  None    Rx / DC Orders ED Discharge Orders     None        Gwyneth Sprout, MD 05/05/21 2337

## 2021-05-06 ENCOUNTER — Emergency Department (HOSPITAL_COMMUNITY)
Admission: EM | Admit: 2021-05-06 | Discharge: 2021-05-06 | Disposition: A | Discharge status: Home / Self Care | Payer: Medicaid - Out of State | Source: Home / Self Care

## 2021-05-06 DIAGNOSIS — Z5321 Procedure and treatment not carried out due to patient leaving prior to being seen by health care provider: Secondary | ICD-10-CM | POA: Insufficient documentation

## 2021-05-06 DIAGNOSIS — R4182 Altered mental status, unspecified: Secondary | ICD-10-CM | POA: Insufficient documentation

## 2021-05-06 NOTE — ED Notes (Signed)
Pt in direct line of sight of nurses station. Resting with eyes closed. Normal chest rise.

## 2021-05-06 NOTE — ED Provider Notes (Signed)
Emergency Medicine Provider Triage Evaluation Note  [name] , a 30 y.o. male  was evaluated in triage.  Pt states he needs medication for his mind because his mind is not right. Denies si or hi. Reports avh but answers "I don't know" when asked to elaborate what his hallucinations include  Review of Systems  Positive: avh Negative: Si, hi  Physical Exam  BP 126/82   Pulse 80   Temp 99 F (37.2 C) (Oral)   Resp 14   SpO2 98%  Gen:   Awake, no distress   Resp:  Normal effort  MSK:   Moves extremities without difficulty  Other:    Medical Decision Making  Medically screening exam initiated at 5:42 PM.  Appropriate orders placed.  Rohn Delamora was informed that the remainder of the evaluation will be completed by another provider, this initial triage assessment does not replace that evaluation, and the importance of remaining in the ED until their evaluation is complete.    Rayne Du 05/06/21 Neil Crouch, MD 05/06/21 2233

## 2021-05-06 NOTE — Discharge Instructions (Addendum)
For your behavioral health needs you are advised to continue treatment with the [facility] ACT Team:       [facility]      [address], [address]      [place], [place] [address]      Office: [phone]      Crisis: [phone]

## 2021-05-06 NOTE — ED Triage Notes (Signed)
[place]. Reports to EMS that he is out of medication and is scared for his life. When asking any questions pt responds "I dont know" pt able to tell me his name and age only. VSS, ambulatory

## 2021-05-06 NOTE — ED Notes (Signed)
Per Nehemiah Settle, PMHNP, pt is psych cleared and cleared for d/c at this time. Will notify ED provider.

## 2021-05-06 NOTE — ED Notes (Signed)
NA for vitals  °

## 2021-05-06 NOTE — BH Assessment (Signed)
BHH Assessment Progress Note   Per Melbourne Abts, PA, this voluntary pt is psychiatrically cleared.  At 08:53 this Clinical research associate called [facility] to notify them of disposition, and spoke to [employer].  She reports that they will not be picking pt up this morning.  Discharge instructions advise pt to continue treatment with the [facility] ACT Team.  EDP Alvester Chou, MD and pt's nurse, Ladona Ridgel, have been notified.  Doylene Canning, [place] Behavioral Health Coordinator [phone]

## 2021-05-06 NOTE — ED Provider Notes (Signed)
Patient was evaluated by psychiatry team and deemed to be appropriate for outpatient management.  His vitals have remained stable throughout his stay in the ED.  He is resting comfortably this morning.  He is not under IVC and is here voluntarily.  Resources provided at time of discharge.   Terald Sleeper, MD 05/06/21 [phone]

## 2021-05-09 ENCOUNTER — Emergency Department (HOSPITAL_COMMUNITY)
Admission: EM | Admit: 2021-05-09 | Discharge: 2021-05-09 | Disposition: A | Discharge status: Home / Self Care | Payer: Medicaid - Out of State | Attending: Emergency Medicine | Admitting: Emergency Medicine

## 2021-05-09 ENCOUNTER — Encounter (HOSPITAL_COMMUNITY): Payer: Self-pay | Admitting: Emergency Medicine

## 2021-05-09 DIAGNOSIS — R519 Headache, unspecified: Secondary | ICD-10-CM | POA: Diagnosis present

## 2021-05-09 DIAGNOSIS — F17210 Nicotine dependence, cigarettes, uncomplicated: Secondary | ICD-10-CM | POA: Insufficient documentation

## 2021-05-09 MED ORDER — ACETAMINOPHEN 325 MG PO TABS
650.0000 mg | ORAL_TABLET | Freq: Once | ORAL | Status: AC
  Administered 2021-05-09: 650 mg via ORAL
  Filled 2021-05-09: qty 2

## 2021-05-09 NOTE — ED Triage Notes (Signed)
Per EMS-picked up at library-complaining of a headache-doesn't know when the pain started
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
## 2021-05-09 NOTE — Discharge Instructions (Addendum)
Given Tylenol for your head pain today.  Follow-up with your act team.

## 2021-05-10 ENCOUNTER — Ambulatory Visit (HOSPITAL_COMMUNITY)
Admission: EM | Admit: 2021-05-10 | Discharge: 2021-05-10 | Disposition: A | Discharge status: Home / Self Care | Payer: Medicaid Other | Attending: Urology | Admitting: Urology

## 2021-05-10 DIAGNOSIS — F200 Paranoid schizophrenia: Secondary | ICD-10-CM | POA: Insufficient documentation

## 2021-05-10 NOTE — Discharge Instructions (Signed)
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
## 2021-05-10 NOTE — Progress Notes (Signed)
TRIAGE: ROUTINE    05/10/21 0300  [facility] Triage Screening (Walk-ins at [facility] only)  How Did You Hear About [place]? Self  What Is the Reason for Your Visit/Call Today? Pt states he hasn't been sleeping and he needs a place to sleep. He shares he would like help with his medication. He states, "I don't know what's going on right now - my mind is messed up."  How Long Has This Been Causing You Problems? > than 6 months  Have You Recently Had Any Thoughts About Hurting Yourself? No  Are You Planning to Commit Suicide/Harm Yourself At This time? No  Have you Recently Had Thoughts About Hurting Someone [name]? No  Are You Planning To Harm Someone At This Time? No  Are you currently experiencing any auditory, visual or other hallucinations? Yes  Please explain the hallucinations you are currently experiencing: Pt states he hears voices; he denies he can distinguish what they are saying  Have You Used Any Alcohol or Drugs in the Past 24 Hours? No  What Did You Use and How Much? N/A  Do you have any current medical co-morbidities that require immediate attention? No  Clinician description of patient physical appearance/behavior: Pt is casually dressed; his clothes appear dirty. Pt answers the questions posed very blunt.  What Do You Feel Would Help You the Most Today? Medication(s);Housing Assistance  If access to [facility] was not available, would you have sought care in the Emergency Department? Yes  Determination of Need Routine (7 days)  Options For Referral Medication Management;Outpatient Therapy

## 2021-05-11 ENCOUNTER — Emergency Department (HOSPITAL_COMMUNITY)
Admission: EM | Admit: 2021-05-11 | Discharge: 2021-05-11 | Disposition: A | Discharge status: Home / Self Care | Payer: Medicaid Other | Attending: Emergency Medicine | Admitting: Emergency Medicine

## 2021-05-11 ENCOUNTER — Other Ambulatory Visit: Payer: Self-pay

## 2021-05-11 ENCOUNTER — Encounter (HOSPITAL_COMMUNITY): Payer: Self-pay

## 2021-05-11 DIAGNOSIS — F1210 Cannabis abuse, uncomplicated: Secondary | ICD-10-CM | POA: Diagnosis not present

## 2021-05-11 DIAGNOSIS — F10129 Alcohol abuse with intoxication, unspecified: Secondary | ICD-10-CM | POA: Diagnosis not present

## 2021-05-11 DIAGNOSIS — Z760 Encounter for issue of repeat prescription: Secondary | ICD-10-CM | POA: Diagnosis not present

## 2021-05-11 DIAGNOSIS — F1910 Other psychoactive substance abuse, uncomplicated: Secondary | ICD-10-CM

## 2021-05-11 DIAGNOSIS — F10920 Alcohol use, unspecified with intoxication, uncomplicated: Secondary | ICD-10-CM

## 2021-05-11 DIAGNOSIS — F17210 Nicotine dependence, cigarettes, uncomplicated: Secondary | ICD-10-CM | POA: Diagnosis not present

## 2021-05-11 NOTE — ED Triage Notes (Addendum)
Pt BIB EMS, Pt wants a medication refill for Xanax. Pt has done Marijuana and alcohol today.   124/92 bp  76 pulse  18 respirations  96% room air

## 2021-05-11 NOTE — ED Provider Notes (Signed)
[facility]-EMERGENCY DEPT Provider Note   CSN: [id number] Arrival date & time: 05/11/21  2202     History Chief Complaint  Patient presents with   Medication Refill    [name] is a 30 y.o. male with Pmhx depression, schizophrenia, and substance abuse who presents to the ED via EMS for medication refill.  Per EMS patient wants a medication refill for Xanax.  Patient did admit to EMS that he did marijuana and alcohol today.  When I enter the room patient states that his mind is out of control.  He admits to drinking a 40 ounce of beer and smoking a blunt earlier today.  Patient states that he needs to relax and feels like he needs Xanax.  He denies having a prescription for Xanax in the past.  He denies any SI, HI, AVH.  Patient does admit that he is homeless.  The history is provided by the patient, medical records and the EMS personnel.      Past Medical History:  Diagnosis Date   Depression    Schizophrenia [facility])     Patient Active Problem List   Diagnosis Date Noted   Alcohol abuse 03/21/2021   Substance-induced disorder (HCC) 01/02/2021   Schizophrenia, paranoid type (HCC) 10/24/2020   Paranoid schizophrenia (HCC) 01/05/2020   Other schizophrenia (HCC) 02/14/2018   Cannabis use disorder, moderate, dependence (HCC) 05/10/2016    Past Surgical History:  Procedure Laterality Date   BACK SURGERY         History reviewed. No pertinent family history.  Social History   Tobacco Use   Smoking status: Some Days    Types: Cigarettes   Smokeless tobacco: Never  Vaping Use   Vaping Use: Never used  Substance Use Topics   Alcohol use: No   Drug use: Yes    Types: Marijuana    Comment: rare    Home Medications Prior to Admission medications   Medication Sig Start Date End Date Taking? Authorizing Provider  paliperidone (INVEGA) 6 MG 24 hr tablet Take 1 tablet (6 mg total) by mouth daily for 14 days. Patient not taking: No sig reported  01/02/21 02/22/21  Mare Ferrari, PA-C    Allergies    Shellfish allergy and Bee pollen  Review of Systems   Review of Systems  Constitutional:  Negative for fever.  Psychiatric/Behavioral:  Negative for suicidal ideas.   All other systems reviewed and are negative.  Physical Exam Updated Vital Signs BP 132/89   Pulse 68   Temp 98.4 F (36.9 C) (Oral)   Resp 16   SpO2 99%   Physical Exam Vitals and nursing note reviewed.  Constitutional:      Appearance: He is not ill-appearing.  HENT:     Head: Normocephalic and atraumatic.  Eyes:     Conjunctiva/sclera: Conjunctivae normal.  Cardiovascular:     Rate and Rhythm: Normal rate and regular rhythm.     Pulses: Normal pulses.  Pulmonary:     Effort: Pulmonary effort is normal.     Breath sounds: Normal breath sounds. No wheezing, rhonchi or rales.  Skin:    General: Skin is warm and dry.     Coloration: Skin is not jaundiced.  Neurological:     Mental Status: He is alert.  Psychiatric:        Attention and Perception: He is inattentive.        Mood and Affect: Mood is anxious.  Speech: Speech is slurred.        Cognition and Memory: Cognition is impaired.    ED Results / Procedures / Treatments   Labs (all labs ordered are listed, but only abnormal results are displayed) Labs Reviewed - No data to display  EKG None  Radiology No results found.  Procedures Procedures   Medications Ordered in ED Medications - No data to display  ED Course  I have reviewed the triage vital signs and the nursing notes.  Pertinent labs & imaging results that were available during my care of the patient were reviewed by me and considered in my medical decision making (see chart for details).    MDM Rules/Calculators/A&P                           30 year old male with a history of schizophrenia presents to the ED today via EMS requesting medication refill of Xanax.  On arrival to the ED vitals are stable and patient  appears to be in no acute distress.  He did admit to EMS that he smoked marijuana and drink alcohol earlier today.  On my exam patient states that he needs Xanax as his mind is out of control.  He denies any SI, HI, AVH.  He appears to have speech with anxious mood and impaired cognition.  He does admit to being homeless.  Per PDMP review it does not appear patient has ever been on Xanax in the past.  I had lengthy discussion with patient regarding the fact that he is likely responding to both alcohol and marijuana in his system and does not need an additional inhibitor placed on top of that.  Have given him something to drink, tolerated well.  He has successfully ambulated in the ED without difficulty.  Does not appear to be a threat to himself or others at this time.  I do not feel he needs lab work today.  Will discharge home.   Pt has successfully ambulated without difficulty. Will discharge at this time.   This note was prepared using Dragon voice recognition software and may include unintentional dictation errors due to the inherent limitations of voice recognition software.   Final Clinical Impression(s) / ED Diagnoses Final diagnoses:  Alcoholic intoxication without complication (HCC)  Substance abuse [facility])    Rx / DC Orders ED Discharge Orders     None        Tanda Rockers, PA-C 05/11/21 2312    Derwood Kaplan, MD 05/12/21 1513

## 2021-05-11 NOTE — ED Notes (Signed)
Pt ambulated independently with no issues. PA at bedside and aware.

## 2021-05-12 ENCOUNTER — Ambulatory Visit (HOSPITAL_COMMUNITY)
Admission: EM | Admit: 2021-05-12 | Discharge: 2021-05-12 | Disposition: A | Discharge status: Home / Self Care | Payer: Medicaid Other | Attending: Psychiatry | Admitting: Psychiatry

## 2021-05-12 DIAGNOSIS — F200 Paranoid schizophrenia: Secondary | ICD-10-CM | POA: Insufficient documentation

## 2021-05-12 NOTE — ED Provider Notes (Signed)
Behavioral Health Urgent Care Medical Screening Exam  Patient Name: [name] MRN: [id number] Date of Evaluation: 05/12/21 Chief Complaint:   Diagnosis:  Final diagnoses:  Schizophrenia, paranoid type (HCC)    History of Present illness: [name] is a 30 y.o. male.  Patient presents voluntarily to [facility] behavioral health for walk-in assessment.  He is transported by police, reports he [place] police [place] the bus depot because he felt that he "needed some help."  He discusses that he is hungry and has not eaten today.  He is vague regarding reason for calling police.  He reports readiness to discharge once he has been given food. [name] is assessed face-to-face by nurse practitioner. He is seated in assessment area, no acute distress.  He is alert and oriented, pleasant and cooperative during assessment. He reports euthymic mood with congruent affect.  He denies suicidal and homicidal ideations.  He denies any history of suicide attempts, denies history of self-harm.  He contracts verbally for safety with this Clinical research associate.  He has normal speech and behavior.  He denies both auditory and visual hallucinations.  Patient is able to converse coherently with goal-directed thoughts and no distractibility or preoccupation.   Objectively there is no evidence of psychosis/mania.  [name] endorses average sleep and appetite. He denies alcohol and substance use at this time.  Per medical record, he endorsed both alcohol and marijuana use to ED physician on yesterday.  [name] answers "I do not know" when this writer attempts to confirm conflicting statements. [name] has been diagnosed with paranoid schizophrenia.  He continues to endorse paranoia surrounding his apartment in [facility].  He states "I do not feel comfortable there, I have been on the streets."  Attempted to encourage patient however he refuses to return to apartment, states he would [place] bus dep[place].  He is followed by outpatient  psychiatry through the [facility] ACT team.  TTS counselor spoke with [facility] representative.  [name] receives long-acting injectable, [name].  Next dose Invega Trinza due 06/01/2021. Patient offered support and encouragement.  Patient gives verbal consent to speak with act team provider.   Psychiatric Specialty Exam  Presentation  General Appearance:Appropriate for Environment; Casual  Eye Contact:Fair  Speech:Clear and Coherent; Normal Rate  Speech Volume:Normal  Handedness:Right   Mood and Affect  Mood:Euthymic  Affect:Congruent; Appropriate   Thought Process  Thought Processes:Coherent  Descriptions of Associations:Intact  Orientation:Full (Time, Place and Person)  Thought Content:Paranoid Ideation  Diagnosis of Schizophrenia or Schizoaffective disorder in past: Yes  Duration of Psychotic Symptoms: Greater than six months  Hallucinations:None Pt. states he does not know when asked about his AH  Ideas of Reference:Paranoia  Suicidal Thoughts:No  Homicidal Thoughts:No   Sensorium  Memory:Immediate Fair; Recent Poor; Remote Poor  Judgment:Intact  Insight:Shallow   Executive Functions  Concentration:Fair  Attention Span:Fair  Recall:Fair  Fund of Knowledge:Good  Language:Good   Psychomotor Activity  Psychomotor Activity:Normal   Assets  Assets:Communication Skills; Desire for Improvement; Financial Resources/Insurance; Housing; Leisure Time; Physical Health; Resilience; Social Support   Sleep  Sleep:Fair  Number of hours: -2   No data recorded  Physical Exam: Physical Exam Vitals and nursing note reviewed.  Constitutional:      Appearance: Normal appearance. He is well-developed and normal weight.  HENT:     Head: Normocephalic and atraumatic.     Nose: Nose normal.  Cardiovascular:     Rate and Rhythm: Normal rate.  Pulmonary:     Effort: Pulmonary effort  is normal.  Musculoskeletal:        General: Normal range of  motion.     Cervical back: Normal range of motion.  Neurological:     Mental Status: He is alert and oriented to person, place, and time.  Psychiatric:        Attention and Perception: Attention and perception normal.        Mood and Affect: Mood and affect normal.        Speech: Speech normal.        Behavior: Behavior normal. Behavior is cooperative.        Thought Content: Thought content is paranoid.        Cognition and Memory: Cognition and memory normal.        Judgment: Judgment normal.   Review of Systems  Constitutional: Negative.   HENT: Negative.    Eyes: Negative.   Respiratory: Negative.    Cardiovascular: Negative.   Gastrointestinal: Negative.   Genitourinary: Negative.   Musculoskeletal: Negative.   Skin: Negative.   Neurological: Negative.   Endo/Heme/Allergies: Negative.   Blood pressure 115/89, pulse 80, temperature 98.8 F (37.1 C), temperature source Oral, resp. rate 18, SpO2 98 %. There is no height or weight on file to calculate BMI.  Musculoskeletal: Strength & Muscle Tone: within normal limits Gait & Station: normal Patient leans: N/A   [facility] MSE Discharge Disposition for Follow up and Recommendations: Based on my evaluation the patient does not appear to have an emergency medical condition and can be discharged with resources and follow up care in outpatient services for Medication Management and ACT team follow-up. Patient reviewed with Dr. Bronwen Betters. Follow-up with established ACT team through [facility]. Continue current medications.   Adrian Lance, FNP 05/12/2021, 1:30 PM

## 2021-05-12 NOTE — ED Notes (Signed)
Discharge instructions provided and Pt stated understanding. Pt escorted to the front lobby and provided a bus pass for transportation purposes home. Pt alert, orient and ambulatory. Safety maintained.

## 2021-05-12 NOTE — Discharge Instructions (Addendum)

## 2021-05-12 NOTE — BH Assessment (Signed)
Triage and assessment completed at the same time by another TTS. TTS will get waiver signed.

## 2021-05-12 NOTE — Progress Notes (Signed)
Patient presents to the [facility] with the police voluntarily stating that he needs "chill pills" fo anxiety.  Patient states that he is not sleeping.  He denies SI/HI and does not state that he is experiencing any AVH.  Patient does seem to be somewhat guarded and paranoid.  Patient states, "I am a good person, I just need some help." Patient states, "I am slow and I have mental halth issues."  Patient is currently homeless, but has an ACT Team.  He denies any drug or alcohol use.  He states that he currently has no support. Patient appears to be routine.

## 2021-05-13 ENCOUNTER — Encounter (HOSPITAL_COMMUNITY): Payer: Self-pay

## 2021-05-13 ENCOUNTER — Emergency Department (HOSPITAL_COMMUNITY)
Admission: EM | Admit: 2021-05-13 | Discharge: 2021-05-14 | Disposition: A | Discharge status: Home / Self Care | Payer: Medicaid - Out of State | Attending: Emergency Medicine | Admitting: Emergency Medicine

## 2021-05-13 ENCOUNTER — Other Ambulatory Visit: Payer: Self-pay

## 2021-05-13 DIAGNOSIS — F17210 Nicotine dependence, cigarettes, uncomplicated: Secondary | ICD-10-CM | POA: Insufficient documentation

## 2021-05-13 DIAGNOSIS — Z20822 Contact with and (suspected) exposure to covid-19: Secondary | ICD-10-CM | POA: Diagnosis not present

## 2021-05-13 DIAGNOSIS — F419 Anxiety disorder, unspecified: Secondary | ICD-10-CM | POA: Diagnosis present

## 2021-05-13 DIAGNOSIS — F200 Paranoid schizophrenia: Secondary | ICD-10-CM | POA: Insufficient documentation

## 2021-05-13 LAB — RESP PANEL BY RT-PCR (FLU A&B, COVID) ARPGX2
Influenza A by PCR: NEGATIVE
Influenza B by PCR: NEGATIVE
SARS Coronavirus 2 by RT PCR: NEGATIVE

## 2021-05-13 NOTE — ED Provider Notes (Signed)
Emergency Medicine Provider Triage Evaluation Note  [name] , a 30 y.o. male  was evaluated in triage.  Pt complains of anxiety. Denies SI or HI. Has been seen 68 times in the last 6 months for same.   Review of Systems  Positive: anxiety Negative: SI or HI  Physical Exam  BP 138/90 (BP Location: Left Arm)   Pulse 63   Resp 18   Ht 5\' 5"  (1.651 m)   Wt 74 kg   SpO2 100%   BMI 27.15 kg/m  Gen:   Awake, no distress   Resp:  Normal effort  MSK:   Moves extremities without difficulty  Other:    Medical Decision Making  Medically screening exam initiated at 2:05 PM.  Appropriate orders placed.  Amram Merlin was informed that the remainder of the evaluation will be completed by another provider, this initial triage assessment does not replace that evaluation, and the importance of remaining in the ED until their evaluation is complete.     , PA-C 05/13/21 1406    07/13/21, MD 05/21/21 [id number]

## 2021-05-13 NOTE — ED Triage Notes (Signed)
Pt reports paranoia and needs some medication for it d/t having auditory hallucinations. Denies si/hi

## 2021-05-13 NOTE — ED Provider Notes (Signed)
[facility]-EMERGENCY DEPT Provider Note   CSN: [id number] Arrival date & time: 05/13/21  1112     History Chief Complaint  Patient presents with   Anxiety    [name] is a 30 y.o. male.  30 yo M with a chief complaint of bizarre behavior.  The patient states that he feels a bit off and that he has been hearing things that other people cannot hear.  He is not sure how new or old this is for him.  He is worried that things may get worse.  He denies taking any medications for this.  He was just seen yesterday by psychiatry and I talked to him about this visit and how they indicated that he took an injection.  He tells me that he does not get any injectable medications that he is not on anything at home.  He denies SI or HI.  The history is provided by the patient.  Anxiety Pertinent negatives include no chest pain, no abdominal pain, no headaches and no shortness of breath.  Illness Severity:  Moderate Onset quality:  Gradual Duration:  2 days Timing:  Constant Progression:  Worsening Chronicity:  New Associated symptoms: no abdominal pain, no chest pain, no congestion, no diarrhea, no fever, no headaches, no myalgias, no rash, no shortness of breath and no vomiting       Past Medical History:  Diagnosis Date   Depression    Schizophrenia [facility])     Patient Active Problem List   Diagnosis Date Noted   Alcohol abuse 03/21/2021   Substance-induced disorder (HCC) 01/02/2021   Schizophrenia, paranoid type (HCC) 10/24/2020   Paranoid schizophrenia (HCC) 01/05/2020   Other schizophrenia (HCC) 02/14/2018   Cannabis use disorder, moderate, dependence (HCC) 05/10/2016    Past Surgical History:  Procedure Laterality Date   BACK SURGERY         History reviewed. No pertinent family history.  Social History   Tobacco Use   Smoking status: Some Days    Types: Cigarettes   Smokeless tobacco: Never  Vaping Use   Vaping Use: Never used  Substance  Use Topics   Alcohol use: No   Drug use: Yes    Types: Marijuana    Comment: rare    Home Medications Prior to Admission medications   Medication Sig Start Date End Date Taking? Authorizing Provider  paliperidone (INVEGA) 6 MG 24 hr tablet Take 1 tablet (6 mg total) by mouth daily for 14 days. Patient not taking: No sig reported 01/02/21 02/22/21  Mare Ferrari, PA-C    Allergies    Shellfish allergy and Bee pollen  Review of Systems   Review of Systems  Constitutional:  Negative for chills and fever.  HENT:  Negative for congestion and facial swelling.   Eyes:  Negative for discharge and visual disturbance.  Respiratory:  Negative for shortness of breath.   Cardiovascular:  Negative for chest pain and palpitations.  Gastrointestinal:  Negative for abdominal pain, diarrhea and vomiting.  Musculoskeletal:  Negative for arthralgias and myalgias.  Skin:  Negative for color change and rash.  Neurological:  Negative for tremors, syncope and headaches.  Psychiatric/Behavioral:  Positive for hallucinations. Negative for confusion, dysphoric mood and suicidal ideas. The patient is nervous/anxious.    Physical Exam Updated Vital Signs BP 137/81 (BP Location: Right Arm)   Pulse 63   Temp 98.5 F (36.9 C) (Oral)   Resp 16   Ht 5\' 5"  (1.651 m)  Wt 74.8 kg   SpO2 100%   BMI 27.46 kg/m   Physical Exam Vitals and nursing note reviewed.  Constitutional:      Appearance: He is well-developed.  HENT:     Head: Normocephalic and atraumatic.  Eyes:     Pupils: Pupils are equal, round, and reactive to light.  Neck:     Vascular: No JVD.  Cardiovascular:     Rate and Rhythm: Normal rate and regular rhythm.     Heart sounds: No murmur heard.   No friction rub. No gallop.  Pulmonary:     Effort: No respiratory distress.     Breath sounds: No wheezing.  Abdominal:     General: There is no distension.     Tenderness: There is no abdominal tenderness. There is no guarding or  rebound.  Musculoskeletal:        General: Normal range of motion.     Cervical back: Normal range of motion and neck supple.  Skin:    Coloration: Skin is not pale.     Findings: No rash.  Neurological:     Mental Status: He is alert and oriented to person, place, and time.  Psychiatric:        Behavior: Behavior normal.    ED Results / Procedures / Treatments   Labs (all labs ordered are listed, but only abnormal results are displayed) Labs Reviewed  RESP PANEL BY RT-PCR (FLU A&B, COVID) ARPGX2    EKG None  Radiology No results found.  Procedures Procedures   Medications Ordered in ED Medications - No data to display  ED Course  I have reviewed the triage vital signs and the nursing notes.  Pertinent labs & imaging results that were available during my care of the patient were reviewed by me and considered in my medical decision making (see chart for details).    MDM Rules/Calculators/A&P                           30 yo M with a cc of auditory hallucinations and paranoia. TTS eval. I feel they are medically clear.   The patients results and plan were reviewed and discussed.   Any x-rays performed were independently reviewed by myself.   Differential diagnosis were considered with the presenting HPI.  Medications - No data to display  Vitals:   05/13/21 1154 05/13/21 1405 05/13/21 1933 05/13/21 2035  BP: 138/90  129/82 137/81  Pulse: 63  (!) 58 63  Resp: 18  20 16   Temp:   98.1 F (36.7 C) 98.5 F (36.9 C)  TempSrc: Oral  Oral Oral  SpO2: 100%  99% 100%  Weight:  74 kg  74.8 kg  Height:  5\' 5"  (1.651 m)  5\' 5"  (1.651 m)    Final diagnoses:  Paranoid schizophrenia (HCC)     Final Clinical Impression(s) / ED Diagnoses Final diagnoses:  Paranoid schizophrenia [facility])    Rx / DC Orders ED Discharge Orders     None        , DO 05/13/21 2310

## 2021-05-14 ENCOUNTER — Other Ambulatory Visit: Payer: Self-pay

## 2021-05-14 ENCOUNTER — Ambulatory Visit (HOSPITAL_COMMUNITY)
Admission: EM | Admit: 2021-05-14 | Discharge: 2021-05-14 | Disposition: A | Discharge status: Home / Self Care | Payer: Medicaid Other | Attending: Psychiatry | Admitting: Psychiatry

## 2021-05-14 DIAGNOSIS — Z79899 Other long term (current) drug therapy: Secondary | ICD-10-CM | POA: Insufficient documentation

## 2021-05-14 DIAGNOSIS — F200 Paranoid schizophrenia: Secondary | ICD-10-CM | POA: Insufficient documentation

## 2021-05-14 DIAGNOSIS — Z765 Malingerer [conscious simulation]: Secondary | ICD-10-CM | POA: Insufficient documentation

## 2021-05-14 NOTE — ED Provider Notes (Signed)
Behavioral Health Urgent Care Medical Screening Exam  Patient Name: [name] MRN: [id number] Date of Evaluation: 05/14/21 Chief Complaint:  I'm trying to get money for a room to sleep Diagnosis:  Final diagnoses:  Schizophrenia, paranoid (HCC)    History of Present illness: [name] is a 30 y.o. male presenting to the [facility]  voluntarily with a history of schizophrenia.  Please note this is [name]'s 5 ED visit in the last six months. Patient states that he is trying to get money for a room to sleep and gets some food.  [name] is calm, cooperative and pleasant.  Patient stated his reason for presenting today is he needed money to get a room in order to sleep and money for food.  When asked about his housing, patient stated he was homeless. [name] stated that his aunt manages his money and will give 20.00 when he asks her. [name] stated that his aunt yells at him sometimes and that makes him feel more paranoid when she yells or anyone yells at him. Patient denies any SI or HI or AVH.  Patient did endorse having some paranoia, when asked to explain, patient stated he did not know. When asked about his invega injection patient said he did not like it because it makes him feel weird and a little jittery. Patient did however ask for xanax to calm down. NP made patient aware that he was sitting calmly and looked relaxed and did not appear to be anxious, patient did not comment.  NP spoke with the [facility] mental health agency on call person-ACT team  Director-[name] [phone]. [name] stated that [name] continues to be client on their act team. Ms. [name] also stated that patient is not homeless and has an apartment and recently received his injection. Discussed with [name] that patient has had 80 ED visits in the last 6 months and that patient could benefit from having a guardian or possibly a group home or supervised independent living arrangement. Ms. [name] stated that she would bring  this up at next treatment team meeting for patient.     Psychiatric Specialty Exam  Presentation  General Appearance:Appropriate for Environment; Casual  Eye Contact:Fair  Speech:Clear and Coherent  Speech Volume:Normal  Handedness:Right   Mood and Affect  Mood:Euphoric  Affect:Congruent   Thought Process  Thought Processes:Coherent  Descriptions of Associations:Intact  Orientation:Full (Time, Place and Person)  Thought Content:Paranoid Ideation  Diagnosis of Schizophrenia or Schizoaffective disorder in past: Yes  Duration of Psychotic Symptoms: Greater than six months  Hallucinations:None Pt. states he does not know when asked about his AH  Ideas of Reference:Paranoia  Suicidal Thoughts:No  Homicidal Thoughts:No   Sensorium  Memory:Immediate Fair; Recent Poor; Remote Poor  Judgment:Fair  Insight:Shallow   Executive Functions  Concentration:Fair  Attention Span:Fair  Recall:Fair  Fund of Knowledge:Fair  Language:Good   Psychomotor Activity  Psychomotor Activity:Normal   Assets  Assets:Communication Skills; Financial Resources/Insurance; Housing; Resilience; Social Support; Physical Health   Sleep  Sleep:Fair  Number of hours: -1   No data recorded  Physical Exam: Physical Exam HENT:     Head: Normocephalic and atraumatic.     Nose: Nose normal.  Eyes:     Pupils: Pupils are equal, round, and reactive to light.  Cardiovascular:     Rate and Rhythm: Normal rate.  Pulmonary:     Effort: Pulmonary effort is normal.  Abdominal:     General: Abdomen is flat.  Musculoskeletal:  General: Normal range of motion.     Cervical back: Normal range of motion.  Skin:    General: Skin is warm.  Neurological:     Mental Status: He is alert and oriented to person, place, and time.  Psychiatric:        Attention and Perception: Attention normal. He does not perceive auditory or visual hallucinations.        Mood and Affect:  Affect is blunt and flat.        Speech: Speech normal.        Behavior: Behavior normal. Behavior is cooperative.        Thought Content: Thought content is paranoid.        Cognition and Memory: Cognition normal.        Judgment: Judgment is impulsive.   Review of Systems  Constitutional: Negative.   HENT: Negative.    Eyes: Negative.   Respiratory: Negative.    Cardiovascular: Negative.   Gastrointestinal: Negative.   Genitourinary: Negative.   Musculoskeletal: Negative.   Skin: Negative.   Neurological: Negative.   Endo/Heme/Allergies: Negative.   Psychiatric/Behavioral:         Paranoia  Blood pressure 125/84, pulse 67, temperature 98.6 F (37 C), temperature source Oral, resp. rate 18, SpO2 99 %. There is no height or weight on file to calculate BMI.  Musculoskeletal: Strength & Muscle Tone: within normal limits Gait & Station: normal Patient leans: N/A   [facility] MSE Discharge Disposition for Follow up and Recommendations: Based on my evaluation the patient does not appear to have an emergency medical condition and can be discharged with resources and follow up care in outpatient services for Medication Management and Individual Therapy.   Jasper Riling, NP 05/14/2021, 8:56 PM

## 2021-05-14 NOTE — ED Notes (Signed)
Patient discharge home. AVS/Follow-up/Prescriptions reviewed with patient and he verbalized understanding. Written copy given to patient. Patient transported home by [employer].

## 2021-05-14 NOTE — BH Assessment (Signed)
[name], Routine MR #[id number]: 30 years old presents voluntarily to [facility] via [employer] and unaccompanied.  Pt reports "I don't know why I am here, I don't know what to do, I am homeless".  Pt denies SI, and HI.  Pt reports that he is hearing voices "I don't know what the voices are saying".  Pt admits to prior MH diagnosis.  Pt signed MSE.

## 2021-05-14 NOTE — Discharge Instructions (Addendum)
  Discharge recommendations:  Patient is to take medications as prescribed. Please see information for follow-up appointment with psychiatry and therapy. Please follow up with your primary care provider for all medical related needs.  Please follow up with your [facility] Team, the phone number is [phone] Therapy: We recommend that patient participate in individual therapy to address mental health concerns.  Medications: The parent/guardian is to contact a medical professional and/or outpatient provider to address any new side effects that develop. Parent/guardian should update outpatient providers of any new medications and/or medication changes.   Atypical antipsychotics: If you are prescribed an atypical antipsychotic, it is recommended that your height, weight, BMI, blood pressure, fasting lipid panel, and fasting blood sugar be monitored by your outpatient providers.  Safety:  The patient should abstain from use of illicit substances/drugs and abuse of any medications. If symptoms worsen or do not continue to improve or if the patient becomes actively suicidal or homicidal then it is recommended that the patient return to the closest hospital emergency department, the [facility], or call 911 for further evaluation and treatment. National Suicide Prevention Lifeline 1-800-SUICIDE or 339 879 6817.

## 2021-05-14 NOTE — ED Provider Notes (Signed)
Pt has been evaluated by psych.  He does not meet inpatient criteria.  He is to f/u with his ACT team.  Return if worse.   Jacalyn Lefevre, MD 05/14/21 [phone]

## 2021-05-14 NOTE — Discharge Instructions (Addendum)
Follow-up with your ACT team. 

## 2021-05-14 NOTE — BH Assessment (Signed)
Comprehensive Clinical Assessment (CCA) Note  05/14/2021 Adrian GivensMarlo Leder [id number]  DISPOSITION: Gave clinical report to Roselyn BeringShalon Bobbitt, NP who determined Pt does not meets criteria for inpatient psychiatric treatment and states Pt is psychiatrically cleared. Recommendation is for Pt to follow up with [name]. Notified Dr Milagros EvenerJulia Haviland and Julieanne MansonJessica Pray, RN of recommendation.  The patient demonstrates the following risk factors for suicide: Chronic risk factors for suicide include: psychiatric disorder of schizoaffective disorder and substance use disorder. Acute risk factors for suicide include: unemployment, social withdrawal/isolation, and loss (financial, interpersonal, professional). Protective factors for this patient include: positive therapeutic relationship and hope for the future. Considering these factors, the overall suicide risk at this point appears to be low. Patient is appropriate for outpatient follow up.  Flowsheet Row ED from [facility]13/2021 [facility] from 05/11/2021 in [facility] Pre-admit (Canceled) from [facility]  C-SSRS RISK CATEGORY No Risk No Risk No Risk      Pt is a  30 year old single male who [facility] due to bizarre behavior. Pt has a diagnosis of schizophrenia and a history of medication noncompliance. He has had approximately 68 presentations to EDs, [facility], and [facility] within the past six months. Pt reported to EDP that he has been experiencing auditory hallucinations. During assessment he denies auditory or visual hallucinations. When asked [facility], Pt states "I don't know." He denies depressive symptoms. He denies problems with anxiety. He does report decreased sleep. Pt denies current suicidal ideation. He denies current homicidal ideation. He denies alcohol or other substance use and most recent urine drug  screen and blood alcohol are negative.  Pt says he remains homeless. He says he has not been eating regularly. When asked if he is taking medications, Pt states "I don't know." Pt is followed by [name]. Pt's medical record indicates his next dose Invega Trinza due 06/01/2021. He cannot identify any family or friends who are supportive.  Pt is casually dressed, alert and oriented x4. Pt speaks in a clear tone, at moderate volume and normal pace. Motor behavior appears normal. Eye contact is fair. Pt's mood is euthymic and affect is congruent with mood. Thought process is coherent and relevant. There is no indication from Pt's behavior that he is currently responding to internal stimuli. When asked how staff could assist him tonight or what type of help he needed, Pt states "I don't know." He was cooperative throughout assessment.   Chief Complaint:  Chief Complaint  Patient presents with   Anxiety   Visit Diagnosis: F20.9 Schizophrenia   CCA Screening, Triage and Referral (STR)  Patient Reported Information How did you hear about us? Self  Referral name: [facility]/self  Referral phone number: 0 (N/A)   Whom do you see for routine medical problems? I don't have a doctor  Practice/Facility Name: No data recorded Practice/Facility Phone Number: No data recorded Name of Contact: No data recorded Contact Number: No data recorded Contact Fax Number: No data recorded Prescriber Name: No data recorded Prescriber Address (if known): No data recorded  What Is the Reason for Your Visit/Call Today? Pt says he does not know why he came to ED. He told EDP he was experiencing auditory hallucinations.  How Long Has This Been Causing You Problems? > than 6 months  What Do You Feel Would Help You the Most Today? Housing Assistance; Food Assistance  Have You Recently Been in Any Inpatient Treatment (Hospital/Detox/Crisis Center/28-Day Program)? No  Name/Location of  Program/Hospital:[facility]  How Long Were You There? Per chart, "2 days."  When Were You Discharged? 02/24/20 (Per chart.)   Have You Ever Received Services From [facility] Before? Yes  Who Do You See at [facility]? Pt has previous ED, [facility] and [facility] visits.   Have You Recently Had Any Thoughts About Hurting Yourself? No  Are You Planning to Commit Suicide/Harm Yourself At This time? No   Have you Recently Had Thoughts About Hurting Someone [name]? No  Explanation: No data recorded  Have You Used Any Alcohol or Drugs in the Past 24 Hours? No  How Long Ago Did You Use Drugs or Alcohol? 0000 (Unclear)  What Did You Use and How Much? NA   Do You Currently Have a Therapist/Psychiatrist? Yes  Name of Therapist/Psychiatrist: [name]   Have You Been Recently Discharged From Any Office Practice or Programs? No  Explanation of Discharge From Practice/Program: No data recorded    CCA Screening Triage Referral Assessment Type of Contact: Tele-Assessment  Is this Initial or Reassessment? Initial Assessment  Date Telepsych consult ordered in [facility]:  05/13/21  Time Telepsych consult ordered in [facility]:  2058   Patient Reported Information Reviewed? Yes  Patient Left Without Being Seen? No  Reason for Not Completing Assessment: No data recorded  Collateral Involvement: None   Does Patient Have a Court Appointed Legal Guardian? No data recorded Name and Contact of Legal Guardian: [name], legal guardian.   If Minor and Not Living with Parent(s), Who has Custody? N/A  Is CPS involved or ever been involved? Never  Is APS involved or ever been involved? In the past   Patient Determined To Be At Risk for Harm To Self or Others Based on Review of Patient Reported Information or Presenting Complaint? No  Method: No Plan  Availability of Means: No access or NA  Intent: Vague intent or NA  Notification Required: No need or identified person  Additional  Information for Danger to Others Potential: Active psychosis  Additional Comments for Danger to Others Potential: History of children playing outside triggering patient.  Are There Guns or Other Weapons in Your Home? No  Types of Guns/Weapons: No data recorded Are These Weapons Safely Secured?                            No data recorded Who Could Verify You Are Able To Have These Secured: No data recorded Do You Have any Outstanding Charges, Pending Court Dates, Parole/Probation? None noted  Contacted To Inform of Risk of Harm To Self or Others: Unable to Contact:   Location of Assessment: [facility] ED   Does Patient Present under Involuntary Commitment? No  IVC Papers Initial File Date: 10/22/20   [place] of Residence: [place]   Patient Currently Receiving the Following Services: ACTT Psychologist, educational)   Determination of Need: Routine (7 days)   Options For Referral: Medication Management; Outpatient Therapy     CCA Biopsychosocial Intake/Chief Complaint:  Patient stated he is "scared for his life." He further explained that he is "scared of life."  Patient also stated "I can't hardly breath."  Current Symptoms/Problems: Pt is wheezing. Patient denies SI, HI, NSSH and AVH, Patient does not appear intoxicated, delusional or responding to internal stimuli.   Patient Reported Schizophrenia/Schizoaffective Diagnosis in Past: Yes   Strengths: Pt is linked  to [facility] for [facility].  Preferences: Not assessed.  Abilities: Not assessed.   Type of Services Patient Feels are Needed: Pt states he does not know   Initial Clinical Notes/Concerns: None noted   Mental Health Symptoms Depression:   None   Duration of Depressive symptoms:  N/A   Mania:   None   Anxiety:    Worrying   Psychosis:   Hallucinations   Duration of Psychotic symptoms:  Greater than six months   Trauma:   None   Obsessions:   None   Compulsions:   None    Inattention:   N/A   Hyperactivity/Impulsivity:   N/A   Oppositional/Defiant Behaviors:   N/A   Emotional Irregularity:   N/A   Other Mood/Personality Symptoms:   None noted    Mental Status Exam Appearance and self-care  Stature:   Average   Weight:   Average weight   Clothing:   Age-appropriate; Casual   Grooming:   Normal   Cosmetic use:   None   Posture/gait:   Normal   Motor activity:   Not Remarkable   Sensorium  Attention:   Normal   Concentration:   Normal   Orientation:   X5   Recall/memory:   Normal   Affect and Mood  Affect:   Blunted   Mood:   Euthymic   Relating  Eye contact:   Normal   Facial expression:   Responsive   Attitude toward examiner:   Cooperative   Thought and Language  Speech flow:  Paucity   Thought content:   Appropriate to Mood and Circumstances   Preoccupation:   None   Hallucinations:   None   Organization:  No data recorded  [employer] of Knowledge:   Poor   Intelligence:   Needs investigation   Abstraction:   Concrete   Judgement:   Poor   Reality Testing:   Adequate   Insight:   Poor   Decision Making:   Impulsive   Social Functioning  Social Maturity:   Impulsive   Social Judgement:   Heedless   Stress  Stressors:   Housing; Office manager Ability:   Normal   Skill Deficits:   Communication; Decision making; Self-care; Self-control   Supports:   Support needed     Religion: Religion/Spirituality How Might This Affect Treatment?: Not assessed  Leisure/Recreation: Leisure / Recreation Do You Have Hobbies?: Yes Leisure and Hobbies: football  Exercise/Diet: Exercise/Diet Do You Exercise?: No Have You Gained or Lost A Significant Amount of Weight in the Past Six Months?: No Do You Follow a Special Diet?: No Do You Have Any Trouble Sleeping?: No Explanation of Sleeping Difficulties: "none"   CCA  Employment/Education Employment/Work Situation: Employment / Work Situation Employment Situation: On disability Why is Patient on Disability: Mental health How Long has Patient Been on Disability: UTA Patient's Job has Been Impacted by Current Illness: No Has Patient ever Been in the [employer]?: No  Education: Education Is Patient Currently Attending School?: No Last Grade Completed: 9 Did You Attend College?: No Did You Have An Individualized Education Program (IIEP): No Did You Have Any Difficulty At School?: No Patient's Education Has Been Impacted by Current Illness: No   CCA Family/Childhood History Family and Relationship History: Family history Marital status: Single Does patient have children?: No  Childhood History:  Childhood History Did patient suffer any verbal/emotional/physical/sexual abuse as a child?:  (Pt states "I don't know") Did patient suffer  from severe childhood neglect?:  (Pt states "I don't know") Has patient ever been sexually abused/assaulted/raped as an adolescent or adult?:  (Pt states "I don't know") Was the patient ever a victim of a crime or a disaster?:  (Pt states "I don't know") Witnessed domestic violence?:  (Pt states "I don't know") Has patient been affected by domestic violence as an adult?:  (Pt states "I don't know")  Child/Adolescent Assessment:     CCA Substance Use Alcohol/Drug Use: Alcohol / Drug Use Pain Medications: See MAR Prescriptions: See MAR Over the Counter: See MAR History of alcohol / drug use?: Yes Longest period of sobriety (when/how long): Unknown Substance #1 Name of Substance 1: Marijuana 1 - Age of First Use: UTA 1 - Frequency: UTA 1 - Duration: UTA 1 - Last Use / Amount: UTA 1 - Method of Aquiring: UTA 1- Route of Use: Smoking Substance #2 Name of Substance 2: Alcohol 2 - Age of First Use: teens 2 - Amount (size/oz): varied 2 - Frequency: varied 2 - Duration: unknown 2 - Last Use / Amount:  Unknown 2 - Route of Substance Use: drinking                     ASAM's:  Six Dimensions of Multidimensional Assessment  Dimension 1:  Acute Intoxication and/or Withdrawal Potential:   Dimension 1:  Description of individual's past and current experiences of substance use and withdrawal: Pt reports that he use to smoke marijuana.  Dimension 2:  Biomedical Conditions and Complications:   Dimension 2:  Description of patient's biomedical conditions and  complications: Pt did not report medical  Dimension 3:  Emotional, Behavioral, or Cognitive Conditions and Complications:  Dimension 3:  Description of emotional, behavioral, or cognitive conditions and complications: Schizophrenia, Paranoid  Dimension 4:  Readiness to Change:  Dimension 4:  Description of Readiness to Change criteria: Pt reports that he is not taking medicaiton  Dimension 5:  Relapse, Continued use, or Continued Problem Potential:  Dimension 5:  Relapse, continued use, or continued problem potential critiera description: precontemplation  Dimension 6:  Recovery/Living Environment:  Dimension 6:  Recovery/Iiving environment criteria description: Pt reports that he is homeless  ASAM Severity Score: ASAM's Severity Rating Score: 9  ASAM Recommended Level of Treatment: ASAM Recommended Level of Treatment: Level I Outpatient Treatment   Substance use Disorder (SUD)    Recommendations for Services/Supports/Treatments: Recommendations for Services/Supports/Treatments Recommendations For Services/Supports/Treatments: [facility] ([facility] Community Treatment)  DSM5 Diagnoses: Patient Active Problem List   Diagnosis Date Noted   Alcohol abuse 03/21/2021   Substance-induced disorder (HCC) 01/02/2021   Schizophrenia, paranoid type (HCC) 10/24/2020   Paranoid schizophrenia (HCC) 01/05/2020   Other schizophrenia (HCC) 02/14/2018   Cannabis use disorder, moderate, dependence (HCC) 05/10/2016    Patient Centered Plan: Patient  is on the following Treatment Plan(s):  Anxiety   Referrals to Alternative Service(s): Referred to Alternative Service(s):   Place:   Date:   Time:    Referred to Alternative Service(s):   Place:   Date:   Time:    Referred to Alternative Service(s):   Place:   Date:   Time:    Referred to Alternative Service(s):   Place:   Date:   Time:     Pamalee Leyden, [facility]

## 2021-05-15 ENCOUNTER — Emergency Department (HOSPITAL_COMMUNITY)
Admission: EM | Admit: 2021-05-15 | Discharge: 2021-05-15 | Disposition: A | Discharge status: Home / Self Care | Payer: Medicaid - Out of State | Attending: Emergency Medicine | Admitting: Emergency Medicine

## 2021-05-15 ENCOUNTER — Encounter (HOSPITAL_COMMUNITY): Payer: Self-pay

## 2021-05-15 DIAGNOSIS — F419 Anxiety disorder, unspecified: Secondary | ICD-10-CM | POA: Diagnosis not present

## 2021-05-15 DIAGNOSIS — F17210 Nicotine dependence, cigarettes, uncomplicated: Secondary | ICD-10-CM | POA: Diagnosis not present

## 2021-05-15 NOTE — Discharge Instructions (Addendum)
Please follow-up with your act team.  I have given you information to follow-up with behavioral health urgent care as well as other resources in the area.  Please return to the emergency department if you are having thoughts of hurting yourself or hurting others.

## 2021-05-15 NOTE — ED Provider Notes (Signed)
[facility]-EMERGENCY DEPT Provider Note   CSN: [id number] Arrival date & time: 05/15/21  1500     History Chief Complaint  Patient presents with   Anxiety    [name] is a 30 y.o. male with a history of schizophrenia, depression, alcohol use, marijuana use.  Patient presents emergency department with a chief complaint of anxiety.  Patient states "I need something to help me focus on reality,"  "I need help my mind is not right."  Patient states that he feels the same as he did yesterday when he was assessed by behavioral health urgent care and emergency department.  Patient denies any suicidal ideations, homicidal ideations, auditory hallucinations, visual hallucinations, pain, discomfort, or injury.  Patient endorses using marijuana and alcohol 3 days prior.  Patient denies any alcohol use or drug use today.   Anxiety Pertinent negatives include no chest pain, no abdominal pain, no headaches and no shortness of breath.      Past Medical History:  Diagnosis Date   Depression    Schizophrenia [facility])     Patient Active Problem List   Diagnosis Date Noted   Alcohol abuse 03/21/2021   Substance-induced disorder (HCC) 01/02/2021   Schizophrenia, paranoid type (HCC) 10/24/2020   Paranoid schizophrenia (HCC) 01/05/2020   Other schizophrenia (HCC) 02/14/2018   Cannabis use disorder, moderate, dependence (HCC) 05/10/2016    Past Surgical History:  Procedure Laterality Date   BACK SURGERY         History reviewed. No pertinent family history.  Social History   Tobacco Use   Smoking status: Some Days    Types: Cigarettes   Smokeless tobacco: Never  Vaping Use   Vaping Use: Never used  Substance Use Topics   Alcohol use: No   Drug use: Yes    Types: Marijuana    Comment: rare    Home Medications Prior to Admission medications   Medication Sig Start Date End Date Taking? Authorizing Provider  paliperidone (INVEGA) 6 MG 24 hr tablet Take 1  tablet (6 mg total) by mouth daily for 14 days. Patient not taking: No sig reported 01/02/21 02/22/21  Mare Ferrari, PA-C    Allergies    Shellfish allergy and Bee pollen  Review of Systems   Review of Systems  Constitutional:  Negative for chills and fever.  Eyes:  Negative for visual disturbance.  Respiratory:  Negative for shortness of breath.   Cardiovascular:  Negative for chest pain.  Gastrointestinal:  Negative for abdominal pain, nausea and vomiting.  Genitourinary:  Negative for difficulty urinating.  Musculoskeletal:  Negative for back pain and neck pain.  Skin:  Negative for color change and rash.  Neurological:  Negative for dizziness, syncope, light-headedness and headaches.  Psychiatric/Behavioral:  Negative for confusion, hallucinations and suicidal ideas.    Physical Exam Updated Vital Signs BP 126/84   Pulse 71   Temp 98.5 F (36.9 C)   Resp 20   Ht 5\' 5"  (1.651 m)   Wt 74 kg   SpO2 97%   BMI 27.15 kg/m   Physical Exam Vitals and nursing note reviewed.  Constitutional:      General: He is not in acute distress.    Appearance: He is not ill-appearing, toxic-appearing or diaphoretic.  HENT:     Head: Normocephalic.  Eyes:     General: No scleral icterus.       Right eye: No discharge.        Left eye: No discharge.  Cardiovascular:     Rate and Rhythm: Normal rate.  Pulmonary:     Effort: Pulmonary effort is normal.  Skin:    General: Skin is warm and dry.  Neurological:     General: No focal deficit present.     Mental Status: He is alert.     GCS: GCS eye subscore is 4. GCS verbal subscore is 5. GCS motor subscore is 6.     Comments: Patient able to stand and ambulate without difficulty  Psychiatric:        Attention and Perception: He is attentive. He does not perceive auditory or visual hallucinations.        Mood and Affect: Mood is anxious.        Behavior: Behavior is cooperative.        Thought Content: Thought content does not  include homicidal or suicidal ideation.    ED Results / Procedures / Treatments   Labs (all labs ordered are listed, but only abnormal results are displayed) Labs Reviewed - No data to display  EKG None  Radiology No results found.  Procedures Procedures   Medications Ordered in ED Medications - No data to display  ED Course  I have reviewed the triage vital signs and the nursing notes.  Pertinent labs & imaging results that were available during my care of the patient were reviewed by me and considered in my medical decision making (see chart for details).    MDM Rules/Calculators/A&P                           30 year old male no acute distress, nontoxic appearing.  Presents to the emergency department with a chief complaint of anxiety.  Patient has a history of schizophrenia and is noncompliant with his medication.  Was seen and evaluated by TTS as well as being seen at behavioral urgent care yesterday.  Patient did not meet criteria for inpatient admission.  We will give patient information to follow-up with counseling as well as behavioral health urgent care.   Final Clinical Impression(s) / ED Diagnoses Final diagnoses:  Anxiety    Rx / DC Orders ED Discharge Orders     None        Berneice Heinrich 05/15/21 1708    Jacalyn Lefevre, MD 05/15/21 2012

## 2021-05-15 NOTE — ED Triage Notes (Signed)
Pt states "I need a chill pill for my paranoia."

## 2021-05-18 ENCOUNTER — Emergency Department (HOSPITAL_COMMUNITY)
Admission: EM | Admit: 2021-05-18 | Discharge: 2021-05-19 | Disposition: A | Discharge status: Home / Self Care | Payer: Medicaid - Out of State | Attending: Emergency Medicine | Admitting: Emergency Medicine

## 2021-05-18 ENCOUNTER — Encounter (HOSPITAL_COMMUNITY): Payer: Self-pay | Admitting: Emergency Medicine

## 2021-05-18 ENCOUNTER — Other Ambulatory Visit: Payer: Self-pay

## 2021-05-18 DIAGNOSIS — F17210 Nicotine dependence, cigarettes, uncomplicated: Secondary | ICD-10-CM | POA: Insufficient documentation

## 2021-05-18 DIAGNOSIS — Z20822 Contact with and (suspected) exposure to covid-19: Secondary | ICD-10-CM | POA: Insufficient documentation

## 2021-05-18 DIAGNOSIS — F250 Schizoaffective disorder, bipolar type: Secondary | ICD-10-CM | POA: Diagnosis not present

## 2021-05-18 DIAGNOSIS — R440 Auditory hallucinations: Secondary | ICD-10-CM | POA: Diagnosis present

## 2021-05-18 DIAGNOSIS — R443 Hallucinations, unspecified: Secondary | ICD-10-CM | POA: Diagnosis not present

## 2021-05-18 LAB — CBC WITH DIFFERENTIAL/PLATELET
Abs Immature Granulocytes: 0.01 10*3/uL (ref 0.00–0.07)
Basophils Absolute: 0 10*3/uL (ref 0.0–0.1)
Basophils Relative: 1 %
Eosinophils Absolute: 0.3 10*3/uL (ref 0.0–0.5)
Eosinophils Relative: 6 %
HCT: 44.5 % (ref 39.0–52.0)
Hemoglobin: 15.2 g/dL (ref 13.0–17.0)
Immature Granulocytes: 0 %
Lymphocytes Relative: 39 %
Lymphs Abs: 2.1 10*3/uL (ref 0.7–4.0)
MCH: 30.9 pg (ref 26.0–34.0)
MCHC: 34.2 g/dL (ref 30.0–36.0)
MCV: 90.4 fL (ref 80.0–100.0)
Monocytes Absolute: 0.3 10*3/uL (ref 0.1–1.0)
Monocytes Relative: 6 %
Neutro Abs: 2.5 10*3/uL (ref 1.7–7.7)
Neutrophils Relative %: 48 %
Platelets: 251 10*3/uL (ref 150–400)
RBC: 4.92 MIL/uL (ref 4.22–5.81)
RDW: 12.6 % (ref 11.5–15.5)
WBC: 5.2 10*3/uL (ref 4.0–10.5)
nRBC: 0 % (ref 0.0–0.2)

## 2021-05-18 LAB — COMPREHENSIVE METABOLIC PANEL
ALT: 10 U/L (ref 0–44)
AST: 17 U/L (ref 15–41)
Albumin: 4.5 g/dL (ref 3.5–5.0)
Alkaline Phosphatase: 33 U/L — ABNORMAL LOW (ref 38–126)
Anion gap: 7 (ref 5–15)
BUN: 12 mg/dL (ref 6–20)
CO2: 29 mmol/L (ref 22–32)
Calcium: 9.4 mg/dL (ref 8.9–10.3)
Chloride: 105 mmol/L (ref 98–111)
Creatinine, Ser: 1.13 mg/dL (ref 0.61–1.24)
GFR, Estimated: >60 mL/min (ref >60)
Glucose, Bld: 84 mg/dL (ref 70–99)
Potassium: 3.9 mmol/L (ref 3.5–5.1)
Sodium: 141 mmol/L (ref 135–145)
Total Bilirubin: 0.7 mg/dL (ref 0.3–1.2)
Total Protein: 7.1 g/dL (ref 6.5–8.1)

## 2021-05-18 LAB — RESP PANEL BY RT-PCR (FLU A&B, COVID) ARPGX2
Influenza A by PCR: NEGATIVE
Influenza B by PCR: NEGATIVE
SARS Coronavirus 2 by RT PCR: NEGATIVE

## 2021-05-18 LAB — RAPID URINE DRUG SCREEN, HOSP PERFORMED
Amphetamines: NOT DETECTED
Barbiturates: NOT DETECTED
Benzodiazepines: NOT DETECTED
Cocaine: NOT DETECTED
Opiates: NOT DETECTED
Tetrahydrocannabinol: NOT DETECTED

## 2021-05-18 LAB — ACETAMINOPHEN LEVEL: Acetaminophen (Tylenol), Serum: <10 ug/mL — ABNORMAL LOW (ref 10–30)

## 2021-05-18 LAB — ETHANOL: Alcohol, Ethyl (B): <10 mg/dL (ref <10)

## 2021-05-18 LAB — SALICYLATE LEVEL: Salicylate Lvl: <7 mg/dL — ABNORMAL LOW (ref 7.0–30.0)

## 2021-05-18 MED ORDER — ACETAMINOPHEN 325 MG PO TABS: 650.0000 mg | ORAL_TABLET | ORAL | Status: DC | PRN

## 2021-05-18 MED ORDER — ALUM & MAG HYDROXIDE-SIMETH 200-200-20 MG/5ML PO SUSP: 30.0000 mL | Freq: Four times a day (QID) | ORAL | Status: DC | PRN

## 2021-05-18 NOTE — ED Provider Notes (Signed)
[facility]-EMERGENCY DEPT Provider Note   CSN: [id number] Arrival date & time: 05/18/21  1519     History Chief Complaint  Patient presents with   Psychiatric Evaluation    [name] is a 30 y.o. male with a past medical history significant for depression, schizophrenia, alcohol abuse, and marijuana use who presents to the ED due to auditory hallucinations.  Patient states he has been off his schizophrenia medications.  He is unsure which medications he typically takes. No medications present in EMR.  Patient denies SI, HI, visual hallucinations.  He admits to auditory hallucinations however, he is unable to tell me what he is hearing.  Patient evaluated numerous times over the past few days due to schizophrenia.  Patient notes he is unsure why has not been taking his medications.  No physical complaints.  History obtained from patient and past medical records. No interpreter used during encounter.       Past Medical History:  Diagnosis Date   Depression    Schizophrenia [facility])     Patient Active Problem List   Diagnosis Date Noted   Alcohol abuse 03/21/2021   Substance-induced disorder (HCC) 01/02/2021   Schizophrenia, paranoid type (HCC) 10/24/2020   Paranoid schizophrenia (HCC) 01/05/2020   Other schizophrenia (HCC) 02/14/2018   Cannabis use disorder, moderate, dependence (HCC) 05/10/2016    Past Surgical History:  Procedure Laterality Date   BACK SURGERY         History reviewed. No pertinent family history.  Social History   Tobacco Use   Smoking status: Some Days    Types: Cigarettes   Smokeless tobacco: Never  Vaping Use   Vaping Use: Never used  Substance Use Topics   Alcohol use: No   Drug use: Yes    Types: Marijuana    Comment: rare    Home Medications Prior to Admission medications   Medication Sig Start Date End Date Taking? Authorizing Provider  PRESCRIPTION MEDICATION See admin instructions. Unnamed stabilizing  injection- Every 90 days   Yes [provider]  paliperidone (INVEGA) 6 MG 24 hr tablet Take 1 tablet (6 mg total) by mouth daily for 14 days. Patient not taking: No sig reported 01/02/21 02/22/21  Mare Ferrari, PA-C    Allergies    Shellfish allergy and Bee pollen  Review of Systems   Review of Systems  Constitutional:  Negative for chills and fever.  HENT:  Negative for rhinorrhea and sore throat.   Eyes:  Negative for visual disturbance.  Respiratory:  Negative for shortness of breath.   Cardiovascular:  Negative for chest pain and palpitations.  Gastrointestinal:  Negative for abdominal pain.  Genitourinary:  Negative for dysuria.  Musculoskeletal:  Negative for myalgias.  Skin:  Negative for color change and rash.  Neurological:  Negative for dizziness and light-headedness.  Psychiatric/Behavioral:  Positive for hallucinations. Negative for suicidal ideas.    Physical Exam Updated Vital Signs BP 129/72   Pulse 67   Temp 98.9 F (37.2 C) (Oral)   Resp 16   Ht 5\' 5"  (1.651 m)   Wt 74 kg   SpO2 100%   BMI 27.15 kg/m   Physical Exam Vitals and nursing note reviewed.  Constitutional:      General: He is not in acute distress.    Appearance: He is not ill-appearing.  HENT:     Head: Normocephalic.  Eyes:     Pupils: Pupils are equal, round, and reactive to light.  Cardiovascular:  Rate and Rhythm: Normal rate and regular rhythm.     Pulses: Normal pulses.     Heart sounds: Normal heart sounds. No murmur heard.   No friction rub. No gallop.  Pulmonary:     Effort: Pulmonary effort is normal.     Breath sounds: Normal breath sounds.  Abdominal:     General: Abdomen is flat. There is no distension.     Palpations: Abdomen is soft.     Tenderness: There is no abdominal tenderness. There is no guarding or rebound.  Musculoskeletal:        General: Normal range of motion.     Cervical back: Neck supple.  Skin:    General: Skin is warm and dry.   Neurological:     General: No focal deficit present.     Mental Status: He is alert.  Psychiatric:        Mood and Affect: Mood normal.        Behavior: Behavior normal.    ED Results / Procedures / Treatments   Labs (all labs ordered are listed, but only abnormal results are displayed) Labs Reviewed  COMPREHENSIVE METABOLIC PANEL - Abnormal; Notable for the following components:      Result Value   Alkaline Phosphatase 33 (*)    All other components within normal limits  SALICYLATE LEVEL - Abnormal; Notable for the following components:   Salicylate Lvl <7.0 (*)    All other components within normal limits  ACETAMINOPHEN LEVEL - Abnormal; Notable for the following components:   Acetaminophen (Tylenol), Serum <10 (*)    All other components within normal limits  RESP PANEL BY RT-PCR (FLU A&B, COVID) ARPGX2  ETHANOL  RAPID URINE DRUG SCREEN, HOSP PERFORMED  CBC WITH DIFFERENTIAL/PLATELET    EKG None  Radiology No results found.  Procedures Procedures   Medications Ordered in ED Medications  acetaminophen (TYLENOL) tablet 650 mg (has no administration in time range)  alum & mag hydroxide-simeth (MAALOX/MYLANTA) 200-200-20 MG/5ML suspension 30 mL (has no administration in time range)    ED Course  I have reviewed the triage vital signs and the nursing notes.  Pertinent labs & imaging results that were available during my care of the patient were reviewed by me and considered in my medical decision making (see chart for details).    MDM Rules/Calculators/A&P                           30 year old male presents to the ED due to complaints of auditory hallucinations.  Patient has a history of schizophrenia and is not taking his medications in an unknown amount of time.  Patient is unsure which medications he is typically on.  Patient has been evaluated numerous times over the past month for the same complaint.  Denies SI, HI, and visual hallucinations.  Upon arrival,  vitals all within normal limits.  Patient in no acute distress.  Benign physical exam.  Medical clearance labs ordered.  CBC unremarkable no leukocytosis and normal hemoglobin.  CMP reassuring with normal renal function no major electrolyte derangements.  Ethanol, salicylate, acetaminophen levels within normal limits.  Patient has been medically cleared for TTS evaluation.  The patient has been placed in psychiatric observation due to the need to provide a safe environment for the patient while obtaining psychiatric consultation and evaluation, as well as ongoing medical and medication management to treat the patient's condition.  The patient has not been placed under full  IVC at this time.  Final Clinical Impression(s) / ED Diagnoses Final diagnoses:  Hallucinations    Rx / DC Orders ED Discharge Orders     None        [name] 05/18/21 2225    Ernie Avena, MD 05/19/21 1606

## 2021-05-18 NOTE — ED Triage Notes (Signed)
[place]. Pt has hx of schizophrenia and has been off his meds. Pt admits to alcohol use

## 2021-05-18 NOTE — ED Notes (Signed)
One bag of patient belongings was placed behind the 9-12 nurses station. 

## 2021-05-18 NOTE — BH Assessment (Addendum)
Comprehensive Clinical Assessment (CCA) Note  05/19/2021 Adrian GivensMarlo Warner [id number]  Discharge Disposition: Adrian Abtsody Taylor, PA-C, reviewed pt's chart and information and determined pt can be psych cleared. This information was relayed to pt's nurse, Varney BaasHolly RN, at [phone].  The patient demonstrates the following risk factors for suicide: Chronic risk factors for suicide include: psychiatric disorder of Schizoaffective disorder, Bipolar type and demographic factors (male, 81>60 y/o). Acute risk factors for suicide include: social withdrawal/isolation. Protective factors for this patient include: positive therapeutic relationship. Considering these factors, the overall suicide risk at this point appears to be none. Patient is not appropriate for outpatient follow up.  Therefore, no sitter is recommended for suicide precautions.  Flowsheet Row ED from 05[facility]18/2021 [facility] from 05/15/2021 in [facility] Pre-admit (Canceled) [facility]  C-SSRS RISK CATEGORY No Risk No Risk No Risk     Chief Complaint:  Chief Complaint  Patient presents with   Psychiatric Evaluation   Visit Diagnosis: F25.0, Schizoaffective disorder, Bipolar type  CCA Screening, Triage and Referral (STR) Adrian GivensMarlo Kindel is a 30 year old patient who [facility] due to pt being at the bus station, his hx of schizophrenia, and being off of his medication. When asked why pt was brought to the ED, pt states, "I'm not sure I can breath. I'm just scared for my life." When asked if pt had been given a COVID test, he was able to confirm that he had been and that the test was negative, which is what his chart reflected.  Pt denies SI, HI, AVH, NSSIB, access to guns/weapons, and SA. Of note, pt reportedly admitted to EtOH use earlier. Pt states he has court tomorrow (05/19/2021); with pt's verbal consent, clinician  looked up pt's potential court dates and identified pt has 2 court hearings listed from 2 different incidents. Pt was charged with 2nd degree trespassing and resisting a Solicitorpublic officer on one occasion and was charged with possession of a controlled substance in prison/on jail premises - a felony - on another.  Clinician made contact with the director of pt's [facility] 0017 who stated she had been contacted approximately 3 hours ago in regards to pt being in the hospital. The director requested she not be contacted again regarding this admission. The director shared she was aware of pt's upcoming court dates. She stated she had been in contact with [facility] and that they plan to schedule a meeting in regards to this pt. The director stated there is nothing she can do in regards to keeping pt from entering the hospital.  Pt's orientation and memory are UTA. Pt was cooperative, though flat, throughout the assessment process. Pt's insight, judgement, and impulse control is poor at this time.  Patient Reported Information How did you hear about us? Self  What Is the Reason for Your Visit/Call Today? Pt states, "I'm not sure I can breathe. I'm just scared for my life."  How Long Has This Been Causing You Problems? > than 6 months  What Do You Feel Would Help You the Most Today? Medication(s); Treatment for Depression or other mood problem   Have You Recently Had Any Thoughts About Hurting Yourself? No  Are You Planning to Commit Suicide/Harm Yourself At This time? No   Have you Recently Had Thoughts About Hurting Someone [name]? No  Are You Planning to Harm Someone at This Time? No  Explanation: No  data recorded  Have You Used Any Alcohol or Drugs in the Past 24 Hours? Yes  How Long Ago Did You Use Drugs or Alcohol? 0000 (Unclear)  What Did You Use and How Much? EtOH   Do You Currently Have a Therapist/Psychiatrist? Yes  Name of Therapist/Psychiatrist: [facility] ACT  Team   Have You Been Recently Discharged From Any Office Practice or Programs? No  Explanation of Discharge From Practice/Program: No data recorded    CCA Screening Triage Referral Assessment Type of Contact: Tele-Assessment  Telemedicine Service Delivery: Telemedicine service delivery: This service was provided via telemedicine using a 2-way, interactive audio and video technology  Is this Initial or Reassessment? Initial Assessment  Date Telepsych consult ordered in [facility]:  05/18/21  Time Telepsych consult ordered in [facility]:  1832  Location of Assessment: [facility] ED  Provider Location: [facility]   Collateral Involvement: [facility] on-call: ACT Team Director Hoy Morn: [phone] was contacted at [phone]   Does Patient Have a Court Appointed Legal Guardian? No data recorded Name and Contact of Legal Guardian: [name], legal guardian.   If Minor and Not Living with Parent(s), Who has Custody? N/A  Is CPS involved or ever been involved? Never  Is APS involved or ever been involved? Never   Patient Determined To Be At Risk for Harm To Self or Others Based on Review of Patient Reported Information or Presenting Complaint? No  Method: No Plan  Availability of Means: No access or NA  Intent: Vague intent or NA  Notification Required: No need or identified person  Additional Information for Danger to Others Potential: Active psychosis  Additional Comments for Danger to Others Potential: History of children playing outside triggering patient.  Are There Guns or Other Weapons in Your Home? No  Types of Guns/Weapons: No data recorded Are These Weapons Safely Secured?                            No data recorded Who Could Verify You Are Able To Have These Secured: No data recorded Do You Have any Outstanding Charges, Pending Court Dates, Parole/Probation? None noted  Contacted To Inform of Risk of Harm To Self or Others: Other: Comment (N/A)    Does  Patient Present under Involuntary Commitment? No  IVC Papers Initial File Date: 10/22/20   [place] of Residence: [place]   Patient Currently Receiving the Following Services: ACTT Psychologist, educational)   Determination of Need: Routine (7 days)   Options For Referral: Medication Management; Outpatient Therapy; Other: Comment (ACT Team)     CCA Biopsychosocial Patient Reported Schizophrenia/Schizoaffective Diagnosis in Past: Yes   Strengths: Pt is linked to [employer] for ACT Team.   Mental Health Symptoms Depression:   None   Duration of Depressive symptoms:  Duration of Depressive Symptoms: N/A   Mania:   None   Anxiety:    Worrying; Restlessness   Psychosis:   Hallucinations   Duration of Psychotic symptoms:  Duration of Psychotic Symptoms: Greater than six months   Trauma:   None   Obsessions:   None   Compulsions:   None   Inattention:   N/A   Hyperactivity/Impulsivity:   N/A   Oppositional/Defiant Behaviors:   N/A   Emotional Irregularity:   N/A   Other Mood/Personality Symptoms:   None noted    Mental Status Exam Appearance and self-care  Stature:   Average   Weight:   Average weight  Clothing:   Age-appropriate; Casual   Grooming:   Normal   Cosmetic use:   None   Posture/gait:   Normal   Motor activity:   Not Remarkable   Sensorium  Attention:   Normal   Concentration:   Normal   Orientation:   X5   Recall/memory:   Normal   Affect and Mood  Affect:   Blunted; Flat   Mood:   Euthymic   Relating  Eye contact:   Normal   Facial expression:   Responsive   Attitude toward examiner:   Cooperative   Thought and Language  Speech flow:  Paucity   Thought content:   Appropriate to Mood and Circumstances   Preoccupation:   None   Hallucinations:   None   Organization:  No data recorded  [employer] of Knowledge:   Poor   Intelligence:   Needs investigation    Abstraction:   Concrete   Judgement:   Poor   Reality Testing:   Adequate   Insight:   Poor   Decision Making:   Impulsive   Social Functioning  Social Maturity:   Impulsive   Social Judgement:   Heedless   Stress  Stressors:   Housing; Office manager Ability:   Normal   Skill Deficits:   Building services engineer; Self-care; Self-control   Supports:   Support needed     Religion: Religion/Spirituality Are You A Religious Person?:  (Not assessed) How Might This Affect Treatment?: Not assessed  Leisure/Recreation: Leisure / Recreation Do You Have Hobbies?: Yes Leisure and Hobbies: Football  Exercise/Diet: Exercise/Diet Do You Exercise?: No Have You Gained or Lost A Significant Amount of Weight in the Past Six Months?: No Do You Follow a Special Diet?: No Do You Have Any Trouble Sleeping?: No Explanation of Sleeping Difficulties: Pt denies   CCA Employment/Education Employment/Work Situation: Employment / Work Systems developer: On disability Why is Patient on Disability: Mental health How Long has Patient Been on Disability: UTA Patient's Job has Been Impacted by Current Illness:  (N/A) Has Patient ever Been in the [employer]?: No  Education: Education Is Patient Currently Attending School?: No Last Grade Completed: 9 Did You Attend College?: No Did You Have An Individualized Education Program (IIEP): No Did You Have Any Difficulty At School?: No Patient's Education Has Been Impacted by Current Illness: No   CCA Family/Childhood History Family and Relationship History: Family history Marital status: Single Does patient have children?: No  Childhood History:  Childhood History By whom was/is the patient raised?: Other (Comment) (Not assessed) Did patient suffer any verbal/emotional/physical/sexual abuse as a child?:  (Pt states "I don't know") Did patient suffer from severe childhood neglect?:  (Pt states "I  don't know") Has patient ever been sexually abused/assaulted/raped as an adolescent or adult?:  (Pt states "I don't know") Was the patient ever a victim of a crime or a disaster?:  (Pt states "I don't know") Witnessed domestic violence?:  (Pt states "I don't know") Has patient been affected by domestic violence as an adult?:  (Pt states "I don't know")  Child/Adolescent Assessment:     CCA Substance Use Alcohol/Drug Use: Alcohol / Drug Use Pain Medications: See MAR Prescriptions: See MAR Over the Counter: See MAR History of alcohol / drug use?: Yes Longest period of sobriety (when/how long): Unknown Negative Consequences of Use:  (Pt denies) Withdrawal Symptoms:  (Pt denies) Substance #1 Name of Substance 1: Marijuana 1 - Age of First Use:  UTA 1 - Amount (size/oz): UTA 1 - Frequency: UTA 1 - Duration: UTA 1 - Last Use / Amount: Several days ago pt admitted to the use of marijuana; today pt denies any use 1 - Method of Aquiring: UTA 1- Route of Use: Smoking Substance #2 Name of Substance 2: Alcohol 2 - Age of First Use: Teens 2 - Amount (size/oz): Varied 2 - Frequency: Varied 2 - Duration: Unknown 2 - Last Use / Amount: Earlier tonight pt admitted to the use of EtOH today; pt now denies any use 2 - Method of Aquiring: Unknown 2 - Route of Substance Use: Drinking                     ASAM's:  Six Dimensions of Multidimensional Assessment  Dimension 1:  Acute Intoxication and/or Withdrawal Potential:   Dimension 1:  Description of individual's past and current experiences of substance use and withdrawal: Pt reports that he use to smoke marijuana.  Dimension 2:  Biomedical Conditions and Complications:   Dimension 2:  Description of patient's biomedical conditions and  complications: Pt did not report medical  Dimension 3:  Emotional, Behavioral, or Cognitive Conditions and Complications:  Dimension 3:  Description of emotional, behavioral, or cognitive conditions  and complications: Schizophrenia, Paranoid  Dimension 4:  Readiness to Change:  Dimension 4:  Description of Readiness to Change criteria: Pt reports that he is not taking medicaiton  Dimension 5:  Relapse, Continued use, or Continued Problem Potential:  Dimension 5:  Relapse, continued use, or continued problem potential critiera description: Precontemplation  Dimension 6:  Recovery/Living Environment:  Dimension 6:  Recovery/Iiving environment criteria description: Pt reports that he is homeless  ASAM Severity Score: ASAM's Severity Rating Score: 9  ASAM Recommended Level of Treatment: ASAM Recommended Level of Treatment: Level I Outpatient Treatment   Substance use Disorder (SUD) Substance Use Disorder (SUD)  Checklist Symptoms of Substance Use: Continued use despite having a persistent/recurrent physical/psychological problem caused/exacerbated by use, Continued use despite persistent or recurrent social, interpersonal problems, caused or exacerbated by use  Recommendations for Services/Supports/Treatments: Recommendations for Services/Supports/Treatments Recommendations For Services/Supports/Treatments: ACCTT (Assertive Community Treatment)  Discharge Disposition: Adrian Abts, PA-C, reviewed pt's chart and information and determined pt can be psych cleared. This information was relayed to pt's nurse, [name], at [phone].  DSM5 Diagnoses: Patient Active Problem List   Diagnosis Date Noted   Alcohol abuse 03/21/2021   Substance-induced disorder (HCC) 01/02/2021   Schizophrenia, paranoid type (HCC) 10/24/2020   Paranoid schizophrenia (HCC) 01/05/2020   Other schizophrenia (HCC) 02/14/2018   Cannabis use disorder, moderate, dependence (HCC) 05/10/2016     Referrals to Alternative Service(s): Referred to Alternative Service(s):   Place:   Date:   Time:    Referred to Alternative Service(s):   Place:   Date:   Time:    Referred to Alternative Service(s):   Place:   Date:   Time:     Referred to Alternative Service(s):   Place:   Date:   Time:     Ralph Dowdy, LMFT

## 2021-05-19 ENCOUNTER — Emergency Department (HOSPITAL_COMMUNITY)
Admission: EM | Admit: 2021-05-19 | Discharge: 2021-05-19 | Disposition: A | Discharge status: Home / Self Care | Payer: Medicaid Other | Attending: Emergency Medicine | Admitting: Emergency Medicine

## 2021-05-19 ENCOUNTER — Ambulatory Visit (HOSPITAL_COMMUNITY)
Admission: AD | Admit: 2021-05-19 | Discharge: 2021-05-19 | Disposition: A | Discharge status: Home / Self Care | Payer: Medicaid Other | Attending: Psychiatry | Admitting: Psychiatry

## 2021-05-19 ENCOUNTER — Telehealth: Payer: Self-pay

## 2021-05-19 ENCOUNTER — Encounter (HOSPITAL_COMMUNITY): Payer: Self-pay

## 2021-05-19 ENCOUNTER — Other Ambulatory Visit: Payer: Self-pay

## 2021-05-19 DIAGNOSIS — Z5900 Homelessness unspecified: Secondary | ICD-10-CM | POA: Insufficient documentation

## 2021-05-19 DIAGNOSIS — F17210 Nicotine dependence, cigarettes, uncomplicated: Secondary | ICD-10-CM | POA: Insufficient documentation

## 2021-05-19 DIAGNOSIS — R443 Hallucinations, unspecified: Secondary | ICD-10-CM | POA: Insufficient documentation

## 2021-05-19 DIAGNOSIS — F200 Paranoid schizophrenia: Secondary | ICD-10-CM | POA: Insufficient documentation

## 2021-05-19 NOTE — Telephone Encounter (Signed)
   Ademide Newstrom DOB: 12-11-1990 MRN: [id number]   RIDER WAIVER AND RELEASE OF LIABILITY  For purposes of improving physical access to our facilities, [facility] is pleased to partner with third parties to provide [place] patients or other authorized individuals the option of convenient, on-demand ground transportation services (the [employer]") through use of the technology service that enables users to request on-demand ground transportation from independent third-party providers.  By opting to use and accept these [employer], I, the undersigned, hereby agree on behalf of myself, and on behalf of any minor child using the Science writer for whom I am the parent or legal guardian, as follows:  Science writer provided to me are provided by independent third-party transportation providers who are not [employer] or employees and who are unaffiliated with [employer]. [facility] is neither a transportation carrier nor a common or public carrier. [facility] has no control over the quality or safety of the transportation that occurs as a result of the [employer]. [facility] cannot guarantee that any third-party transportation provider will complete any arranged transportation service. [facility] makes no representation, warranty, or guarantee regarding the reliability, timeliness, quality, safety, suitability, or availability of any of the Transport Services or that they will be error free. I fully understand that traveling by vehicle involves risks and dangers of serious bodily injury, including permanent disability, paralysis, and death. I agree, on behalf of myself and on behalf of any minor child using the Transport Services for whom I am the parent or legal guardian, that the entire risk arising out of my use of the [employer] remains solely with me, to the maximum extent permitted under applicable law. The [employer] are provided "as is"  and "as available." [facility] disclaims all representations and warranties, express, implied or statutory, not expressly set out in these terms, including the implied warranties of merchantability and fitness for a particular purpose. I hereby waive and release [facility], its agents, employees, officers, directors, representatives, insurers, attorneys, assigns, successors, subsidiaries, and affiliates from any and all past, present, or future claims, demands, liabilities, actions, causes of action, or suits of any kind directly or indirectly arising from acceptance and use of the [employer]. I further waive and release [facility] and its affiliates from all present and future liability and responsibility for any injury or death to persons or damages to property caused by or related to the use of the [employer]. I have read this Waiver and Release of Liability, and I understand the terms used in it and their legal significance. This Waiver is freely and voluntarily given with the understanding that my right (as well as the right of any minor child for whom I am the parent or legal guardian using the [employer]) to legal recourse against [place] [facility] in connection with the [employer] is knowingly surrendered in return for use of these services.   I attest that I read the consent document to [name], gave Mr. Vorce the opportunity to ask questions and answered the questions asked (if any). I affirm that Anan Rudin then provided consent for he's participation in this program.     Launa Grill

## 2021-05-19 NOTE — ED Notes (Signed)
Pt moved to rm 12, TTS in progress.

## 2021-05-19 NOTE — ED Notes (Addendum)
An After Visit Summary was printed and given to the patient. Discharge instructions given and no further questions at this time.  Pt denies SI and HI at this time.

## 2021-05-19 NOTE — ED Notes (Signed)
TTS complete, pt returned to [facility], now resting comfortably.

## 2021-05-19 NOTE — ED Provider Notes (Signed)
[facility]-EMERGENCY DEPT Provider Note   CSN: [id number] Arrival date & time: 05/19/21  1844     History Chief Complaint  Patient presents with   Agitation    [name] is a 30 y.o. male.  HPI  30 year old male with a history of depression, schizophrenia, who presents to the emergency department today for evaluation of hallucinations.  Patient has been seen in the department multiple times over the last few weeks complaining of similar symptoms.  He denies SI or HI at this time.  On review of prior records patient was seen earlier today and was cleared by psychiatry.  Additionally his records indicate that he is currently receiving long acting invega trinza. His next dose is on 06/01/2021.   Past Medical History:  Diagnosis Date   Depression    Schizophrenia [facility])     Patient Active Problem List   Diagnosis Date Noted   Alcohol abuse 03/21/2021   Substance-induced disorder (HCC) 01/02/2021   Schizophrenia, paranoid type (HCC) 10/24/2020   Paranoid schizophrenia (HCC) 01/05/2020   Other schizophrenia (HCC) 02/14/2018   Cannabis use disorder, moderate, dependence (HCC) 05/10/2016    Past Surgical History:  Procedure Laterality Date   BACK SURGERY         History reviewed. No pertinent family history.  Social History   Tobacco Use   Smoking status: Some Days    Types: Cigarettes   Smokeless tobacco: Never  Vaping Use   Vaping Use: Never used  Substance Use Topics   Alcohol use: No   Drug use: Yes    Types: Marijuana    Comment: rare    Home Medications Prior to Admission medications   Medication Sig Start Date End Date Taking? Authorizing Provider  PRESCRIPTION MEDICATION See admin instructions. Unnamed stabilizing injection- Every 90 days    [provider]  paliperidone (INVEGA) 6 MG 24 hr tablet Take 1 tablet (6 mg total) by mouth daily for 14 days. Patient not taking: No sig reported 01/02/21 02/22/21  Mare Ferrari,  PA-C    Allergies    Shellfish allergy and Bee pollen  Review of Systems   Review of Systems  Constitutional:  Negative for fever.  Respiratory:  Negative for shortness of breath.   Cardiovascular:  Negative for chest pain.  Gastrointestinal:  Negative for abdominal pain, constipation, diarrhea, nausea and vomiting.  Musculoskeletal:  Negative for back pain.  Neurological:  Negative for headaches.  Psychiatric/Behavioral:  Positive for hallucinations.        No si or hi   Physical Exam Updated Vital Signs BP 124/82   Pulse 72   Temp 99.1 F (37.3 C) (Oral)   Resp 18   Ht 5\' 5"  (1.651 m)   Wt 74 kg   SpO2 99%   BMI 27.15 kg/m   Physical Exam Constitutional:      General: He is not in acute distress.    Appearance: He is well-developed.  Eyes:     Conjunctiva/sclera: Conjunctivae normal.  Cardiovascular:     Rate and Rhythm: Normal rate.  Pulmonary:     Effort: Pulmonary effort is normal.  Abdominal:     General: Abdomen is flat.  Skin:    General: Skin is warm and dry.  Neurological:     Mental Status: He is alert and oriented to person, place, and time.    ED Results / Procedures / Treatments   Labs (all labs ordered are listed, but only abnormal results are displayed)  Labs Reviewed - No data to display  EKG None  Radiology No results found.  Procedures Procedures   Medications Ordered in ED Medications - No data to display  ED Course  I have reviewed the triage vital signs and the nursing notes.  Pertinent labs & imaging results that were available during my care of the patient were reviewed by me and considered in my medical decision making (see chart for details).    MDM Rules/Calculators/A&P                          30 year old male with a history of depression, schizophrenia, who presents to the emergency department today for evaluation of hallucinations.  Patient has been seen in the department multiple times over the last few weeks  complaining of similar symptoms.  He denies SI or HI at this time.  On review of prior records patient was seen earlier today and was cleared by psychiatry.  Additionally his records indicate that he is currently receiving long acting invega trinza. His next dose is on 06/01/2021.   Patient has no medical complaints at this time.  His last laboratory work was recently and is very reassuring.  I discussed the case with Arvil Persons at [facility] who does not have any further medication recommendations and states that the act team currently manages the patient's medications.  Patient does not appear to have any emergent psychiatric complaints that would warrant additional psychiatric evaluation today.  Feel he is appropriate for discharge home.  Advised to follow-up and return if worse.   Final Clinical Impression(s) / ED Diagnoses Final diagnoses:  Hallucinations    Rx / DC Orders ED Discharge Orders     None        Rayne Du 05/19/21 2051    Linwood Dibbles, MD 05/22/21 1347

## 2021-05-19 NOTE — ED Notes (Signed)
Pt states he has court this morning and would like to leave RN Ladona Ridgel informed

## 2021-05-19 NOTE — ED Triage Notes (Signed)
BIB EMS for blurry eyes saying he cant see but was reading ems laptop and said that what she wrote on laptop wasn't true. Pt reports paranoid and needs meds. Hx of schizophrenia.

## 2021-05-20 ENCOUNTER — Ambulatory Visit (HOSPITAL_COMMUNITY)
Admission: EM | Admit: 2021-05-20 | Discharge: 2021-05-20 | Disposition: A | Discharge status: Home / Self Care | Payer: Medicaid Other | Attending: Nurse Practitioner | Admitting: Nurse Practitioner

## 2021-05-20 DIAGNOSIS — F200 Paranoid schizophrenia: Secondary | ICD-10-CM | POA: Diagnosis present

## 2021-05-20 DIAGNOSIS — Z5900 Homelessness unspecified: Secondary | ICD-10-CM | POA: Diagnosis not present

## 2021-05-20 NOTE — H&P (Signed)
Behavioral Health Medical Screening Exam  Visit Diagnosis:   -Paranoid Schizophrenia (HCC)   [name] is a 30 y.o. male with past psychiatric history significant for paranoid schizophrenia who presents to the [facility] behavioral health hospital [facility]) accompanied as a voluntary walk-in.  Patient is well known to our service and has presented to [facility], the [facility] behavioral health urgent care, and the surrounding emergency departments many times within the past 3 to 4 months.  Patient was evaluated by TTS last night on the evening of 05/18/2021 while patient was at [facility] emergency department (Patient's [facility] ACT Team was also contacted for collateral information by TTS at that time as well) and patient was psych cleared.  Per chart review, patient then presented to [facility] emergency department again earlier this evening on 05/19/2021.  Per chart review of a 05/19/21 ED visit, it was deemed by EDP that patient did not need an additional psychiatric evaluation at that time. Patient was then discharged from the emergency department prior to the patient coming to [facility].   Throughout this assessment, patient is cooperative but answers "I don't know" to most questions that are asked during the assessment.  When patient is asked why he came to [facility], patient states "I don't know man. I'm scared for my life".  When patient is asked why he is scared for his life and what he is scared of, patient states "I don't know".  When patient is asked if there are any particular stressors or reasons that would attribute to why he is scared for his life, patient states there are not.  Patient denies SI.  He denies any recent suicide attempts.  He denies any recent self-injurious behavior via intentionally cutting or burning himself.  Patient denies HI.  He denies auditory hallucinations.  He denies visual hallucinations.  Patient does endorse being paranoid, but when patient is asked why he  is feeling paranoid and to describe his paranoia, patient states "I don't know man. I don't know. I don't know."  And does not provide further details regarding his paranoia.  Based on previous evaluations of the patient and chart review of other evaluations of the patient, patient's responses to questions and patient's paranoia continues to appear to be at patient's baseline.  When patient is asked about sleep, feelings of anhedonia, feelings of guilt/hopelessness/worthlessness, or appetite, patient states "I don't know". Per previous documented conversations with patient's ACT team, it has been documented that patient received his last Invega Trinza injection on 02/18/2021 and receives this injection every 3 months and is due for his next [name] injection on 06/01/2021.  When patient is asked if he has been following up with his ACT team, patient states "I don't know".  Patient states that he is not taking any psychotropic medications currently.  Patient states that he needs some medication to "help me sleep".  Patient states that he is currently homeless and has been homeless "for a while".  It has been documented in the past and reported in the past by patient's ACT team and patient's aunt that the patient has a fully furnished apartment in [facility] [place] [facility] stocked with food, air conditioning, and security locks on the windows, but it has been reported and documented multiple times that patient does not want to live in this apartment due to his paranoia.  When patient is asked if he still has his apartment [place], patient states "yes".  When patient is asked why he  does not want to live at his apartment in [place] [place], patient states there is no specific reason for why he has not been living in his apartment and then states "yes, I'll go to it".  Patient states that his aunt is still his payee.  Patient denies any alcohol, tobacco, or illicit substance use at this time.  Patient verbally contracts for  safety.  Patient states that if he is to be discharged, he will not try to harm or kill himself.  Patient refused for his ACT Team to be contacted for collateral information.  Patient did provide consent for his aunt/payee [name]: [phone]) to be contacted for collateral information.  Collateral information was obtained by Beryle Flock, Counselor and myself speaking with patient's aunt/payee Clide Cliff Luck: [phone]).  That obtained collateral information is shown below (as documented in Beryle Flock, Counselor 05/20/21 CCA note):   "Call placed to pt's aunt with pt's permission to gain additional information and insight. She stated that pt was seen by his ACT Team psychiatrist yesterday. Aunt stated that pt had been in court yesterday regarding a larceny charged stemming from pt taking a taxi and not paying for the ride. Aunt stated that his "mentor" dropped him off at the ED instead of bringing him home after court. Pt has an apartment in [place] per Aunt but he does not like to stay there because he "does not know anybody in [name] stated he returns again and again to [place] because he "knows people in [place]." Aunt stated she is his payee but not his legal guardian. Aunt stated she had recently "gotten papers to fill out" to become his legal guardian at some point in the future"  Patient's aunt also stated that patient would probably prefer to live in [place] versus [place] due to the patient knowing more people in [place], but patient's aunt states that patient's apartment will not be able to potentially be moved from [place] to [place] until the patient's current [place] apartment lease is up which reportedly does not occur until May 2023. Patient's aunt also stated that she is not able to come pick the patient up from [facility] at this time.   Additionally, collateral obtained from TTS Duard Brady, LMFT) speaking with patient's ACT team on 05/18/21  shown below (as documented in [place], [place] 05/18/21 CCA note):   "Clinician made contact with the director of pt's ACT Team through [facility] at 0017 who stated she had been contacted approximately 3 hours ago in regards to pt being in the hospital. The director requested she not be contacted again regarding this admission. The director shared she was aware of pt's upcoming court dates. She stated she had been in contact with [facility] and that they plan to schedule a meeting in regards to this pt. The director stated there is nothing she can do in regards to keeping pt from entering the hospital."  Total Time spent with patient: 30 minutes  Psychiatric Specialty Exam:  Presentation  General Appearance: Appropriate for Environment; Casual; Well Groomed  Eye Contact:Fair; Fleeting  Speech:Clear and Coherent; Normal Rate  Speech Volume:Normal  Handedness:Right   Mood and Affect  Mood:Euthymic  Affect:Congruent   Thought Process  Thought Processes:Coherent; Goal Directed  Descriptions of Associations:Intact  Orientation:Full (Time, Place and Person)  Thought Content:Paranoid Ideation  History of Schizophrenia/Schizoaffective disorder:Yes  Duration of Psychotic Symptoms:Greater than six months  Hallucinations:Hallucinations: None  Ideas of Reference:Paranoia  Suicidal Thoughts:Suicidal Thoughts: No  Homicidal  Thoughts:Homicidal Thoughts: No   Sensorium  Memory:Immediate Poor; Recent Poor; Remote Poor  Judgment:Fair  Insight:Shallow   Executive Functions  Concentration:Fair  Attention Span:Fair  Recall:Fair  Fund of Knowledge:Fair  Language:Good   Psychomotor Activity  Psychomotor Activity:Psychomotor Activity: Normal   Assets  Assets:Communication Skills; Desire for Improvement; Financial Resources/Insurance; Housing; Leisure Time; Physical Health; Social Support   Sleep  Sleep:Sleep: -- (Patient states "I don't know" when asked about  his sleep.)    Physical Exam: Physical Exam Vitals reviewed.  Constitutional:      General: He is not in acute distress.    Appearance: Normal appearance. He is not ill-appearing, toxic-appearing or diaphoretic.  HENT:     Head: Normocephalic and atraumatic.     Right Ear: External ear normal.     Left Ear: External ear normal.     Nose: Nose normal.  Eyes:     General:        Right eye: No discharge.        Left eye: No discharge.     Conjunctiva/sclera: Conjunctivae normal.  Cardiovascular:     Rate and Rhythm: Normal rate.  Pulmonary:     Effort: Pulmonary effort is normal. No respiratory distress.  Musculoskeletal:        General: Normal range of motion.     Cervical back: Normal range of motion.  Neurological:     General: No focal deficit present.     Mental Status: He is alert and oriented to person, place, and time.     Comments: No tremor noted.   Psychiatric:        Attention and Perception: He does not perceive auditory or visual hallucinations.        Mood and Affect: Mood and affect normal.        Speech: Speech normal.        Behavior: Behavior is not agitated, slowed, aggressive, withdrawn, hyperactive or combative. Behavior is cooperative.        Thought Content: Thought content is paranoid. Thought content is not delusional. Thought content does not include homicidal or suicidal ideation.   Review of Systems  Constitutional:  Negative for chills, diaphoresis, fever, malaise/fatigue and weight loss.  HENT:  Negative for congestion.   Respiratory:  Negative for cough and shortness of breath.   Cardiovascular:  Negative for chest pain and palpitations.  Gastrointestinal:  Negative for abdominal pain, constipation, diarrhea, nausea and vomiting.  Musculoskeletal:  Negative for joint pain and myalgias.  Neurological:  Negative for dizziness and headaches.  Psychiatric/Behavioral:  Negative for depression, hallucinations, memory loss, substance abuse and  suicidal ideas. The patient is not nervous/anxious.   All other systems reviewed and are negative.  Vitals: Blood pressure 130/89, pulse 62, temperature 98.5 F (36.9 C), temperature source Oral, resp. rate 18, SpO2 100 %. There is no height or weight on file to calculate BMI.  Musculoskeletal: Strength & Muscle Tone: within normal limits Gait & Station: normal Patient leans: N/A   Recommendations:  Based on my evaluation the patient does not appear to have an emergency medical condition.  Patient denies SI, HI, AVH, or delusions on exam.  Patient is not psychotic on exam.  Patient does continue to endorse paranoia, but based on previous evaluations of the patient compared to this current evaluation of the patient, patient's paranoia appears to be at baseline and patient's paranoia is not affecting the patient to the point that the patient is a danger/threat to himself or others at  this time.  Based on patient's history and patient's current presentation, patient has not decompensated psychiatrically to the point that patient is a risk to himself or others or needs inpatient psychiatric treatment criteria at this time.  Patient is not an imminent risk/threat to himself or others.  Patient does not meet inpatient psychiatric treatment criteria.  Recommend that patient follow up with his [facility] ACT Team regarding his psychotropic medication regimen and therapy services.  Also recommend that the patient follow up with his ACT team regarding potentially getting his [place] [facility] [place] in the future if the patient desires that to be the case.  Safety planning done at length with the patient regarding appropriate actions to take/resources to utilize [facility], nearest ED, 911) if the patient becomes suicidal or homicidal, if the patient's condition rapidly deteriorates/worsen/does not improve, or if the patient begins to experience psychosis or mental health  crisis.  Patient verbally contracts for safety.  All patient's questions answered and concerns addressed.  Patient discharged to his destination of choice.  Jaclyn ShaggyCody W Breuna Loveall, PA-C 05/20/2021, 1:06 AM

## 2021-05-20 NOTE — BH Assessment (Addendum)
Comprehensive Clinical Assessment (CCA) Screening, Triage and Referral Note  05/20/2021 [name] [id number]  DISPOSITION: Per Melbourne Abts, PA, pt is psych cleared and able to be discharged.   The patient demonstrates the following risk factors for suicide: Chronic risk factors for suicide include: psychiatric disorder of Schizoaffective d/o . Acute risk factors for suicide include: N/A. Protective factors for this patient include: positive therapeutic relationship. Considering these factors, the overall suicide risk at this point appears to be low. Patient is appropriate for outpatient follow up.  Flowsheet Row ED from 05/19/2021 in [name] from 05/18/2021 in [name] from 05/15/2021 in [name]  C-SSRS RISK CATEGORY No Risk No Risk No Risk      Pt came to the [name] voluntarily and unaccompanied after leaving the ED earlier in the day. Pt denied SI, HI, AVH, NSSH and any physical symptoms. Hx of schizoaffective d/o. Pt has an ACT Team through [place] who were contacted when he was seen yesterday. Pt stated he did not know what he needed or how to help him. Pt denied substance use. He stated he was out of his medications and needed refills. Per earlier note, pt is due soon for his IM Hinda Glatter and gets all other medications through his ACT Team. Call placed to pt's aunt with pt's permission to gain additional information and insight. She stated that pt was seen by his ACT Team psychiatrist yesterday. Aunt stated that pt had been in court yesterday regarding a larceny charged stemming from pt taking a taxi and not paying for the ride. Aunt stated that his "mentor" dropped him off at the ED instead of bringing him home after court. Pt has an apartment in [place] per Aunt but he does not like to stay there because he "does not know anybody in [name] stated he returns again and again  to [name] because he "knows people in [name]." Aunt stated she is his payee but not his legal guardian. Aunt stated she had recently "gotten papers to fill out" to become his legal guardian at some point in the future.  Patient was of average stature, weight and build with normal grooming and casual dress. Posture/gait, movement, concentration, and memory within normal limits. Normal attention and concentration and oriented to person, time, place and situation. Mood was blunted and affect was congruent with mood. Normal eye contact and responsive facial expressions. Patient was cooperative and guarded. He was not able to answer questions except to say "I don;t know" or short one word answers including yes/no when asked. Speech, thought content and organization was not within normal limits although pt seemed capable of answering but seemed to choose not to. Appeared to have possibly impaired intelligence with poor judgment and insight     Chief Complaint: No chief complaint on file.  Visit Diagnosis:  Schizoaffective d/o  Patient Reported Information How did you hear about [place]? Self  What Is the Reason for Your Visit/Call Today? Pt came to the [name] voluntarily and unaccompanied after leaving the ED earlier in the day. Pt denied SI, HI, AVH, NSSH and any physical symptoms. Hx of schizoaffective d/o. Pt has an ACT Team through [name] who were contacted when he was seen yesterday. Pt stated he did not know what he needed or how to help him. Pt denied substance use. He stated he was out of his medications and needed refills. Per earlier note, pt is due soon  for his IM Hinda Glatter and gets all other medications through his ACT Team. Call placed to pt's aunt with pt's permission to gain additional information and insight. She stated that pt was seen by his ACT Team psychiatrist yesterday. Aunt stated that pt had been in court yesterday regarding a larceny charged stemming from pt taking a taxi and not  paying for the ride. Aunt stated that his "mentor" dropped him off at the ED instead of bringing him home. Pt ahs an apartment in [name] per Aunt but he does not like to stay there because he "does not know anybody in [name] stated he returns again and again to [name]. Aunt stated she is his payee but not his legal guardian. Aunt stated she had recently gotten papers to fill out to become his legal guardian at some point in the future.  How Long Has This Been Causing You Problems? > than 6 months  What Do You Feel Would Help You the Most Today? -- (Pt stated "I don't know.")   Have You Recently Had Any Thoughts About Hurting Yourself? No  Are You Planning to Commit Suicide/Harm Yourself At This time? No   Have you Recently Had Thoughts About Hurting Someone [name]? No  Are You Planning to Harm Someone at This Time? No  Explanation: No data recorded  Have You Used Any Alcohol or Drugs in the Past 24 Hours? No  How Long Ago Did You Use Drugs or Alcohol? 0000 (Unclear)  What Did You Use and How Much? EtOH   Do You Currently Have a Therapist/Psychiatrist? Yes  Name of Therapist/Psychiatrist: [facility]   Have You Been Recently Discharged From Any Office Practice or Programs? No  Explanation of Discharge From Practice/Program: No data recorded   CCA Screening Triage Referral Assessment Type of Contact: Face-to-Face  Telemedicine Service Delivery: Telemedicine service delivery: This service was provided via telemedicine using a 2-way, interactive audio and video technology  Is this Initial or Reassessment? Reassessment  Date Telepsych consult ordered in [facility]:  05/18/21  Time Telepsych consult ordered in [facility]:  1832  Location of Assessment: [facility]  Provider Location: [facility]   Collateral Involvement: Call placed successfully to pt's aunt and payee, [name] [phone]) with  pt's permission.   Does Patient Have a Automotive engineer Guardian? No data recorded Name and Contact of Legal Guardian: [name], legal guardian.   If Minor and Not Living with Parent(s), Who has Custody? N/A  Is CPS involved or ever been involved? -- Rich Reining)  Is APS involved or ever been involved? -- Rich Reining)   Patient Determined To Be At Risk for Harm To Self or Others Based on Review of Patient Reported Information or Presenting Complaint? No (Per Melbourne Abts, PA, pt is psych cleared and able to be discharged.)  Method: No Plan  Availability of Means: No access or NA  Intent: Vague intent or NA  Notification Required: No need or identified person  Additional Information for Danger to Others Potential: Active psychosis  Additional Comments for Danger to Others Potential: History of children playing outside triggering patient.  Are There Guns or Other Weapons in Your Home? No  Types of Guns/Weapons: No data recorded Are These Weapons Safely Secured?                            No data recorded Who Could Verify  You Are Able To Have These Secured: No data recorded Do You Have any Outstanding Charges, Pending Court Dates, Parole/Probation? None noted  Contacted To Inform of Risk of Harm To Self or Others: Other: Comment (N/A)   Does Patient Present under Involuntary Commitment? No  IVC Papers Initial File Date: 10/22/20   [place] of Residence: [place]   Patient Currently Receiving the Following Services: ACTT Psychologist, educational)   Determination of Need: Routine (7 days) (Per Melbourne Abts, [place], pt is psych cleared and able to be discharged.)   Options For Referral: Medication Management; Outpatient Therapy; Other: Comment (ACT Team)   Discharge Disposition:     Carolanne Grumbling, Counselor  Jennilyn Esteve T. Jimmye Norman, MS, [facility], [facility] Triage Specialist [facility]

## 2021-05-20 NOTE — Progress Notes (Addendum)
05/20/21 0615  Patient Reported Information  How Did You Hear About [place]? Legal System  What Is the Reason for Your Visit/Call Today? Per pt, "I don't know." Pt reports, he wants to go to sleep.  How Long Has This Been Causing You Problems? > than 6 months  What Do You Feel Would Help You the Most Today? Housing Assistance;Social Support  Have You Recently Had Any Thoughts About Hurting Yourself? No  Are You Planning to Commit Suicide/Harm Yourself At This time? No  Have you Recently Had Thoughts About Hurting Someone [name]? No  Are You Planning To Harm Someone At This Time? No  Have You Used Any Alcohol or Drugs in the Past 24 Hours? No  Name of Therapist/Psychiatrist [facility].  CCA Screening Triage Referral Assessment  Type of Contact Face-to-Face  Is this Initial or Reassessment? Initial Assessment  Location of Assessment [facility] Assessment Services  Provider location [facility] Assessment Services  Does Patient Have a Automotive engineer Guardian? No  Patient Determined To Be At Risk for Harm To Self or Others Based on Review of Patient Reported Information or Presenting Complaint? No  Does Patient Present under Involuntary Commitment? No  [place] of Residence [place]  Patient Currently Receiving the Following Services: ACTT Psychologist, educational)  Determination of Need Routine (7 days)  Options For Referral Medication Management    [name] is a 30 year old male who presents voluntary and unaccompanied. Pt was a poor historian during the assessment and most of his responses was "I don't know." Pt denies, SI, HI and substance use. Clinician asked the pt if he's experiencing AVH, pt replied, "I don't know." Pt reported, he doesn't like his apartment and chooses to be homeless. Pt can not recall the last time he seen his ACT Team but reports he does like the injection. Pt reports, his aunt is not his payee anymore. Pt was assessed at [facility] on 05/19/2021 and [facility] on  05/18/2021. Pt was psychiatrically cleared from both encounters. Pt reports, he wants to be in a mental hospital because he's mental health. Pt reports, he can contract for safety if discharged.   Per [name] PMHNP pt does not meet inpatient treatment criteria, pt to follow up ACT Team.   Determination of need: Routine.   Redmond Pulling, MS, [facility], [facility] Triage Specialist [phone]

## 2021-05-20 NOTE — Discharge Instructions (Addendum)
   Discharge recommendations:  Patient is to take medications as prescribed. Please see information for follow-up appointment with psychiatry and therapy. Please follow up with your primary care provider for all medical related needs.   Therapy: We recommend that patient participate in individual therapy to address mental health concerns.   Atypical antipsychotics: If you are prescribed an atypical antipsychotic, it is recommended that your height, weight, BMI, blood pressure, fasting lipid panel, and fasting blood sugar be monitored by your outpatient providers.  Safety:  The patient should abstain from use of illicit substances/drugs and abuse of any medications. If symptoms worsen or do not continue to improve or if the patient becomes actively suicidal or homicidal then it is recommended that the patient return to the closest hospital emergency department, the [facility], or call 911 for further evaluation and treatment. National Suicide Prevention Lifeline 1-800-SUICIDE or 1-800-273-8255.  

## 2021-05-20 NOTE — ED Provider Notes (Signed)
Behavioral Health Urgent Care Medical Screening Exam  Patient Name: [name] MRN: [id number] Date of Evaluation: 05/20/21 Chief Complaint:   Diagnosis:  Final diagnoses:  Schizophrenia, paranoid (HCC)    History of Present illness: [name] is a 30 y.o. male [name] is a 30 y.o. male with past psychiatric history significant for paranoid schizophrenia who presents to the [facility] behavioral health hospital [facility]) accompanied as a voluntary walk-in.  Patient is well known to our service and has presented to [facility], the [facility] behavioral health urgent care, and the surrounding emergency departments many times within the past 3 to 4 months.  Patient was evaluated by TTS last night on the evening of 05/18/2021 while patient was at [facility] emergency department (Patient's [facility] ACT Team was also contacted for collateral information by TTS at that time as well) and patient was psych cleared.  Per chart review, patient then presented to [facility] emergency department again earlier this evening on 05/19/2021.  Per chart review of a 05/19/21 ED visit, it was deemed by EDP that patient did not need an additional psychiatric evaluation at that time. Patient was then discharged from the emergency department and went to [facility]. Patient then presented to [facility] voluntarily with law enfocement.   On evaluation patient is alert and oriented, pleasant, and cooperative. Speech is clear and coherent. Mood is euthymic and affect is flat.  Denies auditory and visual hallucinations. No indication that patient is responding to internal stimuli. Reports some paranoia related to living in his apartment, but does not elaborate. Denies suicidal ideations. Denies homicidal ideations. Denies substance abuse.    Patient requests discharge shortly after arriving at [facility].  Psychiatric Specialty Exam  Presentation  General Appearance:Appropriate for Environment; Casual; Well Groomed  Eye  Contact:Fair  Speech:Clear and Coherent; Normal Rate  Speech Volume:Normal  Handedness:Right   Mood and Affect  Mood:Euthymic  Affect:Congruent   Thought Process  Thought Processes:Coherent  Descriptions of Associations:Intact  Orientation:Full (Time, Place and Person)  Thought Content:Paranoid Ideation  Diagnosis of Schizophrenia or Schizoaffective disorder in past: Yes  Duration of Psychotic Symptoms: Greater than six months  Hallucinations:None Pt. states he does not know when asked about his AH  Ideas of Reference:None  Suicidal Thoughts:No  Homicidal Thoughts:No   Sensorium  Memory:Immediate Fair; Recent Fair; Remote Fair  Judgment:Fair  Insight:Lacking   Executive Functions  Concentration:Fair  Attention Span:Fair  Recall:Fair  Fund of Knowledge:Fair  Language:Good   Psychomotor Activity  Psychomotor Activity:Normal   Assets  Assets:Communication Skills; Desire for Improvement; Financial Resources/Insurance; Housing; Physical Health   Sleep  Sleep:-- (Patient states "I don't know" when asked about his sleep.)  Number of hours: -1   No data recorded  Physical Exam: Physical Exam Constitutional:      General: He is not in acute distress.    Appearance: He is not ill-appearing, toxic-appearing or diaphoretic.  HENT:     Head: Normocephalic.     Right Ear: External ear normal.     Left Ear: External ear normal.  Eyes:     Pupils: Pupils are equal, round, and reactive to light.  Cardiovascular:     Rate and Rhythm: Normal rate.  Pulmonary:     Effort: Pulmonary effort is normal. No respiratory distress.  Musculoskeletal:        General: Normal range of motion.  Skin:    General: Skin is warm and dry.  Neurological:     Mental Status: He is alert and  oriented to person, place, and time.  Psychiatric:        Mood and Affect: Mood is not depressed.        Speech: Speech normal.        Behavior: Behavior is cooperative.         Thought Content: Thought content is paranoid. Thought content is not delusional. Thought content does not include homicidal or suicidal ideation. Thought content does not include suicidal plan.   Review of Systems  Constitutional:  Negative for chills, diaphoresis, fever, malaise/fatigue and weight loss.  HENT:  Negative for congestion.   Respiratory:  Negative for cough and shortness of breath.   Cardiovascular:  Negative for chest pain and palpitations.  Gastrointestinal:  Negative for diarrhea, nausea and vomiting.  Neurological:  Negative for dizziness and seizures.  Psychiatric/Behavioral:  Negative for hallucinations, substance abuse and suicidal ideas.   All other systems reviewed and are negative.  Blood pressure (!) 125/93, pulse 71, temperature 97.6 F (36.4 C), temperature source Oral, resp. rate 18, SpO2 100 %. There is no height or weight on file to calculate BMI.  Musculoskeletal: Strength & Muscle Tone: within normal limits Gait & Station: normal Patient leans: N/A   [facility] MSE Discharge Disposition for Follow up and Recommendations: Based on my evaluation the patient does not appear to have an emergency medical condition and can be discharged with resources and follow up care in outpatient services for Medication Management and Individual Therapy  Follow up with ACT Team   Jackelyn Poling, NP 05/20/2021, 6:10 AM

## 2021-05-22 ENCOUNTER — Encounter (HOSPITAL_COMMUNITY): Payer: Self-pay | Admitting: Emergency Medicine

## 2021-05-22 ENCOUNTER — Emergency Department (HOSPITAL_COMMUNITY)
Admission: EM | Admit: 2021-05-22 | Discharge: 2021-05-23 | Disposition: A | Discharge status: Home / Self Care | Payer: Medicaid - Out of State | Attending: Student | Admitting: Student

## 2021-05-22 ENCOUNTER — Other Ambulatory Visit: Payer: Self-pay

## 2021-05-22 DIAGNOSIS — F17210 Nicotine dependence, cigarettes, uncomplicated: Secondary | ICD-10-CM | POA: Insufficient documentation

## 2021-05-22 DIAGNOSIS — F419 Anxiety disorder, unspecified: Secondary | ICD-10-CM | POA: Diagnosis not present

## 2021-05-22 DIAGNOSIS — Y900 Blood alcohol level of less than 20 mg/100 ml: Secondary | ICD-10-CM | POA: Diagnosis not present

## 2021-05-22 DIAGNOSIS — R443 Hallucinations, unspecified: Secondary | ICD-10-CM | POA: Insufficient documentation

## 2021-05-22 DIAGNOSIS — Z20822 Contact with and (suspected) exposure to covid-19: Secondary | ICD-10-CM | POA: Insufficient documentation

## 2021-05-22 NOTE — ED Triage Notes (Signed)
Per [employer], pt voluntary c/o HI (no specific person). ETOH and marijuana on board. Hx of schizophrenia.

## 2021-05-23 ENCOUNTER — Encounter (HOSPITAL_COMMUNITY): Payer: Self-pay | Admitting: Emergency Medicine

## 2021-05-23 ENCOUNTER — Other Ambulatory Visit: Payer: Self-pay

## 2021-05-23 ENCOUNTER — Emergency Department (HOSPITAL_COMMUNITY)
Admission: EM | Admit: 2021-05-23 | Discharge: 2021-05-24 | Disposition: A | Discharge status: Home / Self Care | Payer: Medicaid - Out of State | Source: Home / Self Care | Attending: Emergency Medicine | Admitting: Emergency Medicine

## 2021-05-23 ENCOUNTER — Ambulatory Visit (HOSPITAL_COMMUNITY)
Admission: EM | Admit: 2021-05-23 | Discharge: 2021-05-23 | Disposition: A | Discharge status: Home / Self Care | Payer: Medicaid Other | Attending: Psychiatry | Admitting: Psychiatry

## 2021-05-23 DIAGNOSIS — F17210 Nicotine dependence, cigarettes, uncomplicated: Secondary | ICD-10-CM | POA: Insufficient documentation

## 2021-05-23 DIAGNOSIS — R443 Hallucinations, unspecified: Secondary | ICD-10-CM

## 2021-05-23 DIAGNOSIS — Z5900 Homelessness unspecified: Secondary | ICD-10-CM | POA: Insufficient documentation

## 2021-05-23 DIAGNOSIS — F200 Paranoid schizophrenia: Secondary | ICD-10-CM | POA: Insufficient documentation

## 2021-05-23 LAB — COMPREHENSIVE METABOLIC PANEL
ALT: 11 U/L (ref 0–44)
AST: 18 U/L (ref 15–41)
Albumin: 4.5 g/dL (ref 3.5–5.0)
Alkaline Phosphatase: 37 U/L — ABNORMAL LOW (ref 38–126)
Anion gap: 9 (ref 5–15)
BUN: 13 mg/dL (ref 6–20)
CO2: 28 mmol/L (ref 22–32)
Calcium: 9.3 mg/dL (ref 8.9–10.3)
Chloride: 103 mmol/L (ref 98–111)
Creatinine, Ser: 1.09 mg/dL (ref 0.61–1.24)
GFR, Estimated: >60 mL/min (ref >60)
Glucose, Bld: 90 mg/dL (ref 70–99)
Potassium: 4 mmol/L (ref 3.5–5.1)
Sodium: 140 mmol/L (ref 135–145)
Total Bilirubin: 0.8 mg/dL (ref 0.3–1.2)
Total Protein: 7.2 g/dL (ref 6.5–8.1)

## 2021-05-23 LAB — CBC WITH DIFFERENTIAL/PLATELET
Abs Immature Granulocytes: 0.01 10*3/uL (ref 0.00–0.07)
Basophils Absolute: 0 10*3/uL (ref 0.0–0.1)
Basophils Relative: 1 %
Eosinophils Absolute: 0.5 10*3/uL (ref 0.0–0.5)
Eosinophils Relative: 11 %
HCT: 45.9 % (ref 39.0–52.0)
Hemoglobin: 15.4 g/dL (ref 13.0–17.0)
Immature Granulocytes: 0 %
Lymphocytes Relative: 48 %
Lymphs Abs: 2 10*3/uL (ref 0.7–4.0)
MCH: 30.7 pg (ref 26.0–34.0)
MCHC: 33.6 g/dL (ref 30.0–36.0)
MCV: 91.6 fL (ref 80.0–100.0)
Monocytes Absolute: 0.3 10*3/uL (ref 0.1–1.0)
Monocytes Relative: 7 %
Neutro Abs: 1.4 10*3/uL — ABNORMAL LOW (ref 1.7–7.7)
Neutrophils Relative %: 33 %
Platelets: 205 10*3/uL (ref 150–400)
RBC: 5.01 MIL/uL (ref 4.22–5.81)
RDW: 12.8 % (ref 11.5–15.5)
WBC: 4.2 10*3/uL (ref 4.0–10.5)
nRBC: 0 % (ref 0.0–0.2)

## 2021-05-23 LAB — ETHANOL: Alcohol, Ethyl (B): <10 mg/dL (ref <10)

## 2021-05-23 LAB — RESP PANEL BY RT-PCR (FLU A&B, COVID) ARPGX2
Influenza A by PCR: NEGATIVE
Influenza B by PCR: NEGATIVE
SARS Coronavirus 2 by RT PCR: NEGATIVE

## 2021-05-23 NOTE — ED Notes (Signed)
Pt ambulatory to restroom

## 2021-05-23 NOTE — ED Provider Notes (Addendum)
Behavioral Health Urgent Care Medical Screening Exam  Patient Name: [name] MRN: [id number] Date of Evaluation: 05/23/21 Chief Complaint:   Diagnosis:  Final diagnoses:  Paranoid schizophrenia (HCC)    History of Present illness: [name] is a 30 y.o. male. patient presented to [facility] as a walk in  accompanied by  with complaints of "I just want to get out the way".  [name], 30 y.o., male patient seen face to face by this provider, consulted with Dr. Bronwen Betters; and chart reviewed on 05/23/21.  On evaluation [name] reports "I don't know where to go". Patient was seen in the [facility] today and was discharged. After discharge [facility] [employer].  Per note by Adrian Nora LCSW   "Pt was at [employer] on 2nd floor talking to himself. Someone called [employer] concerned that he was having mental health crisis. Pt told [employer] that he was homeless and needed help for mental health issues. Pt denies SI, HI, AVH. Possible substance use. All I could get out of [name] is that he is scared and trying to get away from people.  Looks like pt was at [facility] earlier today".  During evaluation [name] is i in sitting position.  He is fairly groomed.  He makes fleeting eye contact.  His speech is clear, coherent, normal rate and tone.  He is alert, oriented x 4, calm, and cooperative.  His mood is euthymic with congruent affect.  Objectively there is no evidence of psychosis/mania or delusional thinking.  He also denies suicidal/self-harm/homicidal ideation.  Patient contracts for safety.  Denies access to firearms/weapons.  Endorses auditory hallucinations.  States, "I hear voices I do not know what they say". Denies command auditory hallucinations.  Denies visual hallucinations.    Per chart review patient has an Animator [phone].  Attempted to contact [facility], attempt unsuccessful.  When asking patient where he lives he states, "I do not know".  Per chart review  patient is homeless. Denies alcohol and substance use.  Patient was requesting food.  Per chart review patient appears to be at baseline.    On evaluation patient is not a threat to himself or others.  He will be discharged to follow-up with outpatient resources.   Psychiatric Specialty Exam  Presentation  General Appearance:Casual  Eye Contact:Fair  Speech:Clear and Coherent  Speech Volume:Normal  Handedness:Right   Mood and Affect  Mood:Euthymic  Affect:Congruent   Thought Process  Thought Processes:Coherent  Descriptions of Associations:Intact  Orientation:Full (Time, Place and Person)  Thought Content:Paranoid Ideation  Diagnosis of Schizophrenia or Schizoaffective disorder in past: Yes  Duration of Psychotic Symptoms: Greater than six months  Hallucinations:Auditory just hears voices, doesnt not know what they say  Ideas of Reference:None  Suicidal Thoughts:No  Homicidal Thoughts:No   Sensorium  Memory:Immediate Fair; Recent Fair; Remote Fair  Judgment:Fair  Insight:Lacking   Executive Functions  Concentration:Fair  Attention Span:Fair  Recall:Fair  Fund of Knowledge:Good  Language:Good   Psychomotor Activity  Psychomotor Activity:Normal   Assets  Assets:Communication Skills; Desire for Improvement; Financial Resources/Insurance; Physical Health; Resilience; Social Support; Intimacy   Sleep  Sleep:Fair  Number of hours:    No data recorded  Physical Exam: Physical Exam Vitals and nursing note reviewed.  Constitutional:      Appearance: He is well-developed.  HENT:     Head: Normocephalic and atraumatic.  Eyes:     General:        Right eye: No discharge.  Left eye: No discharge.     Conjunctiva/sclera: Conjunctivae normal.  Cardiovascular:     Rate and Rhythm: Normal rate.  Pulmonary:     Effort: Pulmonary effort is normal. No respiratory distress.  Musculoskeletal:        General: Normal range of motion.      Cervical back: Normal range of motion.  Skin:    General: Skin is dry.  Neurological:     Mental Status: He is alert and oriented to person, place, and time.  Psychiatric:        Attention and Perception: He is inattentive. He perceives auditory hallucinations.        Mood and Affect: Mood normal.        Speech: Speech normal.        Behavior: Behavior is cooperative.        Thought Content: Thought content is paranoid. Thought content does not include homicidal or suicidal ideation.        Cognition and Memory: Cognition normal.        Judgment: Judgment is impulsive.   Review of Systems  Constitutional: Negative.  Negative for chills and fever.  HENT:  Negative for hearing loss.   Eyes: Negative.   Respiratory: Negative.  Negative for cough.   Cardiovascular: Negative.  Negative for chest pain.  Genitourinary: Negative.   Musculoskeletal: Negative.   Skin: Negative.   Neurological: Negative.   Psychiatric/Behavioral:  Positive for hallucinations.   Blood pressure 118/83, pulse 62, temperature 98.6 F (37 C), temperature source Oral, resp. rate 16, SpO2 99 %. There is no height or weight on file to calculate BMI.  Musculoskeletal: Strength & Muscle Tone: within normal limits Gait & Station: normal Patient leans: N/A   [facility] MSE Discharge Disposition for Follow up and Recommendations: Based on my evaluation the patient does not appear to have an emergency medical condition and can be discharged with resources and follow up care in outpatient services for Medication Management  Discharge patient, patient was provided a bus pass.  Encouraged patient to follow-up with his [facility] act team.  No evidence of imminent risk to self or others at present.    Patient does not meet criteria for psychiatric inpatient admission. Discussed crisis plan, support from social network, calling 911, coming to the Emergency Department, and calling Suicide Hotline.    Ardis Hughs,  NP 05/23/2021, 3:14 PM

## 2021-05-23 NOTE — ED Provider Notes (Signed)
Emergency Medicine Provider Triage Evaluation Note  [name] , a 30 y.o. male  was evaluated in triage.  Pt complains of "fear for his life". No SI or HI. Patient evaluated both at [facility] ED and [name] earlier today and psych cleared. Labs earlier today were reassuring. He admits to visual hallucinations. History of schizophrenia.   Review of Systems  Positive: hallucinations Negative: SI  Physical Exam  BP (!) 119/97 (BP Location: Left Arm)   Pulse 68   Temp 97.7 F (36.5 C) (Oral)   SpO2 100%  Gen:   Awake, no distress   Resp:  Normal effort  MSK:   Moves extremities without difficulty  Other:    Medical Decision Making  Medically screening exam initiated at 8:35 PM.  Appropriate orders placed.  [name] was informed that the remainder of the evaluation will be completed by another provider, this initial triage assessment does not replace that evaluation, and the importance of remaining in the ED until their evaluation is complete.  Medical clearance labs earlier today reassuring. No need for further labs.    [name] 05/23/21 2039    Adrian Dibbles, MD 05/24/21 [phone]

## 2021-05-23 NOTE — ED Notes (Signed)
Pt refused blood work PA aware

## 2021-05-23 NOTE — ED Notes (Signed)
Discharge instructions provided and Pt stated understanding. Pt alert, orient and ambulatory prior to d/c from facility. Pt escorted to the front lobby. Safety maintained.  

## 2021-05-23 NOTE — ED Triage Notes (Signed)
Patient states he is seeing things. Patient denies hi and si.

## 2021-05-23 NOTE — ED Provider Notes (Signed)
[facility]-EMERGENCY DEPT Provider Note   CSN: [id number] Arrival date & time: 05/22/21  1824     History Chief Complaint  Patient presents with  . Homicidal    [name] is a 30 y.o. male.  HPI  Patient presents with hallucinations and delusions.  History of schizophrenia, does not recall the last time he took his medicine.  States she is seeing things that are not and hearing voices.  He denies having any homicidal ideations, denies any suicidal ideations.  No prior suicide attempts per the patient.  He denies any pain anywhere else.  He states he has not abused any drugs or alcohol in the last 24 hours.  Past Medical History:  Diagnosis Date  . Depression   . Schizophrenia [facility])     Patient Active Problem List   Diagnosis Date Noted  . Alcohol abuse 03/21/2021  . 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 Schizophrenia, paranoid type (HCC) 10/24/2020  . Paranoid schizophrenia (HCC) 01/05/2020  . Other schizophrenia (HCC) 02/14/2018  . Cannabis use disorder, moderate, dependence (HCC) 05/10/2016    Past Surgical History:  Procedure Laterality Date  . BACK SURGERY         No family history on file.  Social History   Tobacco Use  . Smoking status: Some Days    Types: Cigarettes  . Smokeless tobacco: Never  Vaping Use  . Vaping Use: Never used  Substance Use Topics  . Alcohol use: No  . Drug use: Yes    Types: Marijuana    Comment: rare    Home Medications Prior to Admission medications   Medication Sig Start Date End Date Taking? Authorizing Provider  PRESCRIPTION MEDICATION See admin instructions. Unnamed stabilizing injection- Every 90 days    [provider]  paliperidone (INVEGA) 6 MG 24 hr tablet Take 1 tablet (6 mg total) by mouth daily for 14 days. Patient not taking: No sig reported 01/02/21 02/22/21  Mare Ferrari, PA-C    Allergies    Shellfish allergy and Bee pollen  Review of Systems   Review of  Systems  Psychiatric/Behavioral:  Positive for behavioral problems and hallucinations. Negative for suicidal ideas. The patient is not nervous/anxious.    Physical Exam Updated Vital Signs BP 122/85 (BP Location: Right Arm)   Pulse 72   Temp 97.9 F (36.6 C) (Oral)   Resp 18   SpO2 99%   Physical Exam Vitals and nursing note reviewed. Exam conducted with a chaperone present.  Constitutional:      General: He is not in acute distress.    Appearance: Normal appearance.  HENT:     Head: Normocephalic and atraumatic.  Eyes:     General: No scleral icterus.    Extraocular Movements: Extraocular movements intact.     Pupils: Pupils are equal, round, and reactive to light.  Skin:    Coloration: Skin is not jaundiced.  Neurological:     Mental Status: He is alert. Mental status is at baseline.     Coordination: Coordination normal.  Psychiatric:        Attention and Perception: Attention normal.        Mood and Affect: Mood is anxious.        Speech: Speech normal.        Behavior: Behavior normal. Behavior is cooperative.        Thought Content: Thought content does not include homicidal or suicidal ideation.        Cognition and  Memory: Cognition normal.     Comments: Patient has not responding to internal stimuli.  He is answering questions appropriately.  He is calm and cooperative on exam.    ED Results / Procedures / Treatments   Labs (all labs ordered are listed, but only abnormal results are displayed) Labs Reviewed  RESP PANEL BY RT-PCR (FLU A&B, COVID) [id number]  COMPREHENSIVE METABOLIC PANEL  ETHANOL  RAPID URINE DRUG SCREEN, HOSP PERFORMED  CBC WITH DIFFERENTIAL/PLATELET    EKG None  Radiology No results found.  Procedures Procedures   Medications Ordered in ED Medications - No data to display  ED Course  I have reviewed the triage vital signs and the nursing notes.  Pertinent labs & imaging results that were available during my care of the patient  were reviewed by me and considered in my medical decision making (see chart for details).  Clinical Course as of 05/23/21 1016  Mon May 23, 2021  0939 Comprehensive metabolic panel(!) No electrolyte derangement, no anion gap [HS]  1016 CBC with Diff(!) No leukocytosis, no anemia [HS]    Clinical Course User Index [HS] Theron Arista, PA-C   MDM Rules/Calculators/A&P                           No HI, no SI. Not an iminent threat to self. Approraite for discharge with [facility] followup and behavioral heath resources.   Labs workup reassuring.  See ED course for interpretation more relevant.  Patient vitals are stable, he has no acute physical complaints.  Currently is not having any homicidal or suicidal ideations.  On evaluation he is not a threat to himself.  He has been seen multiple times for similar complaints in the ED setting.  I will discharge him with behavioral health resources and advised him to go to [facility] for further evaluation if needed.  Final Clinical Impression(s) / ED Diagnoses Final diagnoses:  None    Rx / DC Orders ED Discharge Orders     None        Theron Arista, [place] 05/23/21 1016    Kommor, [place], MD 05/23/21 1616

## 2021-05-23 NOTE — BH Assessment (Signed)
Pt was at [employer] on 2nd floor talking to himself. Someone called [employer] concerned that he was having mental health crisis. Pt told [employer] that he was homeless and needed help for mental health issues. Pt denies SI, HI, AVH. Possible substance use. All I could get out of [place] is that he is scared and trying to get away from people.  Looks like pt was at [facility] earlier today.  Pt is routine

## 2021-05-23 NOTE — ED Notes (Signed)
Pt ambulatory in ED lobby. 

## 2021-05-23 NOTE — Discharge Instructions (Signed)
Substance Abuse Treatment Programs ° °Intensive Outpatient Programs °[facility]     °[address]      °[place], [place]                   °[phone]      ° °[facility] °[address] °[place], [place] °[phone] ° °[facility]     °(Inpatient and outpatient)     °[address]           °[phone]   ° °[facility] °336-288-1484 (Suboxone and Methadone) ° °[address]      °[place], [place] [address]      °336-882-2125      ° °[address] °[place], [place] °852-3033 ° °[facility] (Outpatient/Inpatient, Chemical)    °(insurance only) [phone]      °       °[facility] (Groups & Residential) °[place], [place] °336-389-1413 ° °   °[facility]     °[address]     °[place], [place]      °[phone]      ° °[facility] (for caregivers and family) °[address] °[place], [place] °336-299-4655 ° ° ° ° ° °Residential Treatment Programs °[facility]      °[address], [place], [place] [address]  °([phone]      ° °[facility] °[address] [place], [place] [address] °[phone] ° °[facility]        °336-248-8914      ° °[facility] °1-800-659-3381 ° °[facility]             °[address]                                         °[place], [place]                                                °877-615-2722 or 336-784-9470                              ° °[facility] [place] °[address] °[place], [address] °1.877.941.8954 ° °[facility]    °[address]      °[place], [place]     °[phone]      ° °[facility] °[address] °[place], [place] °336-285-9073 ° °[facility]   °[address]     °[place], [place] [address]     °336-899-1550      °Admissions: 8am-3pm M-F ° °[facility] °[address] °[place],  [place] °336-227-7417 ° °[facility]: Residential Program (90 Days)   °[place],       °336-725-8389 or 800-758-6077    ° °[facility] °[place],  °(Walk in Hours over the weekend or by referral) ° °[facility] [facility] °[address], [place],  [address] °([phone] ° °Crisis Mobile: [facility]:  1-877-626-1772 (for crisis response 24 hours a day) °[facility] Hotline:      1-800-256-2452 °Outpatient Psychiatry and Counseling ° °[facility]: Mobile Crisis   Management 24 hours:  1-877-626-1772 ° °[facility] sliding scale fee and walk in schedule: M-F 8am-12pm/1pm-3pm °[address]  °[place], [place] [address] °[phone] ° °[facility] °[address] °[place], [place] [address] °336-703-9650 ° °[facility] ([facility])- new patient walk-in appointments available Monday - Friday 8am -3pm.          °[address] °[place], [place] [address] °336-676-6840 or crisis line- 336-676-6905 ° °[facility]/ Intensive Outpatient Therapy Program °[address] °[place], [place] [address] °336-832-9804 ° °[facility]                  °Crisis Services      °336.641.4993      °[address]     °[place], [place] [address]                ° °[facility]   °[facility] °[phone] °[address] °[place], [place] [address] ° ° °[facility]          °[address]  °[place], [place] [address]       °([phone] ° °[facility] °[address] °[place], [place] [address] °336-292-1510 ° °[facility]    °[address]    °[place], [place] [address]     °336-632-3505      ° °Parish McKinney, MD     °[address]     °[place] [address]     °336-282-1251     °  °[facility] °[address] °[place] [address] ° °[facility]     °[address]     °[place], [place]      °336-542-2076      ° °[facility] °Shamsher Ahluwalia, MD °[address] °[place], [place] [address] °336-420-9558 ° °[facility]     °[address]     °[place], [place] [address]     °336-274-1237      ° °[facility] °[address] °[place], [place] [address] °336-854-4450 °Resources for Temporary Residential Assistance/Crisis Centers ° °DAY CENTERS °[facility] °M-F 8am-3pm   °[address] [place], [place] [address]   336-332-0824 °Services include: laundry, barbering, support groups, case management, phone  & computer access, showers, AA/NA mtgs, mental health/substance abuse nurse, job skills class, disability information, VA assistance, spiritual classes, etc.  ° °HOMELESS SHELTERS ° °[facility]     °[facility]   °[address], [place]     °336.271.5959       °       °[facility] (women and children)       °[address] °[place], [place] [address] °336-275-0820 °[email or web address] for application and process °Application Required ° °[facility]   °[address]    °[place] [address]     °336.886.4922       °             °[facility] °[address] °[place], [place] [address] °336.273.5572 °336-235-0363(schedule application appt.) °Application Required ° °[facility] (women only)    °[address]     °[place],  [address]     °336-884-1039      °  Intake starts 6pm daily °Need valid ID, SSC, & Police report °[facility] [place] °[address] °[place], [place] °336-881-5420 °Application Required ° °[facility] (men only)     °[address]      °[place], [place]     °336.748.1962      ° °[facility] °(Pregnant women only) °[address] °[place], [place] °336-275-0206 ° °[facility]      °[address]      °[place], [place] [address]     °336-722-9951      °       °[facility] [facility] °[address] °[place], [place] °336-723-1848 °90 day commitment/SA/Application process ° °[facility](men only)     °[address]     °[place], [place]     °336-748-1962       °Check-in at 7pm     °       °[facility] °[address] °[place], [place] [address] °336-248-6684 °Men/Women/Women and Children must be there by 7 pm ° °[facility] °[place], [place] °336-722-8721                ° °

## 2021-05-23 NOTE — Discharge Instructions (Addendum)

## 2021-05-24 ENCOUNTER — Other Ambulatory Visit: Payer: Self-pay

## 2021-05-24 ENCOUNTER — Ambulatory Visit (HOSPITAL_COMMUNITY)
Admission: EM | Admit: 2021-05-24 | Discharge: 2021-05-24 | Disposition: A | Discharge status: Home / Self Care | Payer: Medicaid Other | Attending: Registered Nurse | Admitting: Registered Nurse

## 2021-05-24 ENCOUNTER — Encounter (HOSPITAL_COMMUNITY): Payer: Self-pay | Admitting: Registered Nurse

## 2021-05-24 DIAGNOSIS — F1010 Alcohol abuse, uncomplicated: Secondary | ICD-10-CM | POA: Diagnosis present

## 2021-05-24 DIAGNOSIS — Z765 Malingerer [conscious simulation]: Secondary | ICD-10-CM

## 2021-05-24 DIAGNOSIS — Z5900 Homelessness unspecified: Secondary | ICD-10-CM

## 2021-05-24 DIAGNOSIS — F1220 Cannabis dependence, uncomplicated: Secondary | ICD-10-CM | POA: Diagnosis present

## 2021-05-24 DIAGNOSIS — F200 Paranoid schizophrenia: Secondary | ICD-10-CM | POA: Diagnosis present

## 2021-05-24 DIAGNOSIS — F1999 Other psychoactive substance use, unspecified with unspecified psychoactive substance-induced disorder: Secondary | ICD-10-CM | POA: Diagnosis present

## 2021-05-24 NOTE — ED Notes (Signed)
Patient given sandwich and drink before discharge.  

## 2021-05-24 NOTE — ED Provider Notes (Signed)
Behavioral Health Urgent Care Medical Screening Exam  Patient Name: [name] MRN: [id number] Date of Evaluation: 05/24/21 Chief Complaint:   Diagnosis:  Final diagnoses:  Malingering  Homelessness  Schizophrenia, paranoid type (HCC)    History of Present illness: Adrian Habig is a 30 y.o. male patient presented to [facility] as a walk in via [facility] Police with complaints that patient called and stated he needed help for his mental health.    [name], 30 y.o., male patient seen face to face by this provider, consulted with Dr. Earlene Plater; and chart reviewed on 05/24/21.  On evaluation [name] reports "My mind is messed up and I need help."  When asked what was going, patient stated "I don't know."  Patient responded "I don't know to most questions."  Patient did deny suicidal/self-harm/homicidal ideation, psychosis, and paranoia. He states he didn't know his name, date of birth, age, where he lived. But then stated his name, and said he was homeless.  Patient has been to several ED's and urgent care center several times this week.    Collateral Information:  Spoke with [name] of [name] and informed that this is an act put on by patient.  States patient was recently seen and is seen weekly by ACTT and saw his psychiatrist on 05/18/21.  Reports patient goes to ED asking for Benzo.  She is also aware of patient's "I don't know" remark to questions.  During evaluation [name] is sitting up in chair in no acute distress.  He is alert, oriented to self.  He told police that he had never been to this building and didn't know what it was.  Patient continue to give untruthful information and acting ans if he is having a mental problem.  Patient is calm through out assessment.  He does not appear to be responding to internal/external stimuli or delusional thoughts.  Patient denies suicidal/self-harm/homicidal ideation, psychosis, and paranoia.     Psychiatric  Specialty Exam  Presentation  General Appearance:Appropriate for Environment  Eye Contact:Fair  Speech:Clear and Coherent; Normal Rate  Speech Volume:Normal  Handedness:Right   Mood and Affect  Mood:Dysphoric  Affect:Congruent   Thought Process  Thought Processes:Coherent; Goal Directed  Descriptions of Associations:Intact  Orientation:Full (Time, Place and Person) (but states "I don't know to most questions")  Thought Content:WDL  Diagnosis of Schizophrenia or Schizoaffective disorder in past: Yes  Duration of Psychotic Symptoms: Greater than six months  Hallucinations:None denies auditory/visual hallucinations at this time  Ideas of Reference:None  Suicidal Thoughts:No  Homicidal Thoughts:No   Sensorium  Memory:-- (Unable to determine because patient states "I don't know"to most questions)  Judgment:Intact  Insight:Fair   Executive Functions  Concentration:Fair  Attention Span:Fair  Recall:Fair  Fund of Knowledge:Fair  Language:Fair   Psychomotor Activity  Psychomotor Activity:Normal   Assets  Assets:Desire for Improvement; Resilience   Sleep  Sleep:Fair  Number of hours: -1   Nutritional Assessment (For OBS and FBC admissions only) Has the patient had a weight loss or gain of 10 pounds or more in the last 3 months?: No Has the patient had a decrease in food intake/or appetite?: No Does the patient have dental problems?: No Does the patient have eating habits or behaviors that may be indicators of an eating disorder including binging or inducing vomiting?: No Has the patient recently lost weight without trying?: No Has the patient been eating poorly because of a decreased appetite?: No Malnutrition Screening Tool Score: 0   Physical Exam:  Physical Exam Vitals and nursing note reviewed. Exam conducted with a chaperone present.  Constitutional:      General: He is not in acute distress.    Appearance: Normal appearance. He is  not ill-appearing.  Cardiovascular:     Rate and Rhythm: Normal rate.  Pulmonary:     Effort: Pulmonary effort is normal.  Musculoskeletal:        General: Normal range of motion.     Cervical back: Normal range of motion.  Skin:    General: Skin is warm and dry.  Neurological:     Mental Status: He is alert and oriented to person, place, and time.  Psychiatric:        Attention and Perception: Perception normal. He does not perceive auditory or visual hallucinations.        Mood and Affect: Mood and affect normal.        Speech: Speech normal.        Behavior: Behavior is cooperative.        Thought Content: Thought content normal. Thought content is not paranoid or delusional. Thought content does not include homicidal or suicidal ideation.        Judgment: Judgment is impulsive.   Review of Systems  Reason unable to perform ROS: Patient refused to ask questions.  Psychiatric/Behavioral:  Negative for depression. Hallucinations: Denies. Substance abuse: Denies. Suicidal ideas: Denies.Nervous/anxious: Denies.   Blood pressure 108/84, pulse 75, temperature 98.5 F (36.9 C), temperature source Oral, resp. rate 16, SpO2 99 %. There is no height or weight on file to calculate BMI.  Musculoskeletal: Strength & Muscle Tone: within normal limits Gait & Station: normal Patient leans: N/A   [facility] MSE Discharge Disposition for Follow up and Recommendations: Based on my evaluation the patient does not appear to have an emergency medical condition and can be discharged with resources and follow up care in outpatient services for Medication Management, Individual Therapy, Group Therapy, and Keep scheduled appointment with Reuel Derby Carlyle Achenbach, NP 05/24/2021, 5:35 PM

## 2021-05-24 NOTE — Discharge Instructions (Addendum)
You need to follow-up with your [facility] act team.  If you do not know how to get in touch with them, the information for [facility] is listed below. Return to the emergency room with any new, worsening, concerning symptoms.

## 2021-05-24 NOTE — ED Provider Notes (Signed)
[facility]-EMERGENCY DEPT Provider Note   CSN: [id number] Arrival date & time: 05/23/21  1954     History Chief Complaint  Patient presents with   Hallucinations    [name] is a 30 y.o. male presenting for hallucinations.  Level 5 caveat due to psychiatric disorder  Patient states he is having both auditory and visual hallucinations.  He states he is scared.  He has a history of paranoid schizophrenia.  He does not cannot tell me if this is different from his normal.  He denies suicidal or homicidal thoughts.  He reports that he is not taking any medication, however per chart review, he receives IM injections every 90 days.  Per chart review, patient has an act team with [facility], patient is unable to verify this  HPI     Past Medical History:  Diagnosis Date   Depression    Schizophrenia [facility])     Patient Active Problem List   Diagnosis Date Noted   Alcohol abuse 03/21/2021   Substance-induced disorder (HCC) 01/02/2021   Schizophrenia, paranoid type (HCC) 10/24/2020   Paranoid schizophrenia (HCC) 01/05/2020   Other schizophrenia (HCC) 02/14/2018   Cannabis use disorder, moderate, dependence (HCC) 05/10/2016    Past Surgical History:  Procedure Laterality Date   BACK SURGERY         History reviewed. No pertinent family history.  Social History   Tobacco Use   Smoking status: Some Days    Types: Cigarettes   Smokeless tobacco: Never  Vaping Use   Vaping Use: Never used  Substance Use Topics   Alcohol use: No   Drug use: Yes    Types: Marijuana    Comment: rare    Home Medications Prior to Admission medications   Medication Sig Start Date End Date Taking? Authorizing Provider  PRESCRIPTION MEDICATION See admin instructions. Unnamed stabilizing injection- Every 90 days    [provider]  paliperidone (INVEGA) 6 MG 24 hr tablet Take 1 tablet (6 mg total) by mouth daily for 14 days. Patient not taking: No sig reported  01/02/21 02/22/21  Mare Ferrari, PA-C    Allergies    Shellfish allergy and Bee pollen  Review of Systems   Review of Systems  Psychiatric/Behavioral:  Positive for hallucinations.    Physical Exam Updated Vital Signs BP 111/60 (BP Location: Right Arm)   Pulse 60   Temp 97.7 F (36.5 C) (Oral)   Resp 14   Ht 5\' 5"  (1.651 m)   Wt 74 kg   SpO2 100%   BMI 27.15 kg/m   Physical Exam Vitals and nursing note reviewed.  Constitutional:      General: He is not in acute distress.    Appearance: He is well-developed.  HENT:     Head: Normocephalic and atraumatic.  Eyes:     Extraocular Movements: Extraocular movements intact.  Cardiovascular:     Rate and Rhythm: Normal rate.  Pulmonary:     Effort: Pulmonary effort is normal.  Abdominal:     General: There is no distension.  Musculoskeletal:        General: Normal range of motion.     Cervical back: Normal range of motion.  Skin:    General: Skin is warm.     Findings: No rash.  Neurological:     Mental Status: He is alert and oriented to person, place, and time.  Psychiatric:        Attention and Perception: He perceives auditory  and visual hallucinations.        Thought Content: Thought content is paranoid. Thought content does not include homicidal or suicidal ideation. Thought content does not include homicidal or suicidal plan.     Comments: Reporting hallucinations.  He is paranoid.  No SI or HI.    ED Results / Procedures / Treatments   Labs (all labs ordered are listed, but only abnormal results are displayed) Labs Reviewed - No data to display  EKG None  Radiology No results found.  Procedures Procedures   Medications Ordered in ED Medications - No data to display  ED Course  I have reviewed the triage vital signs and the nursing notes.  Pertinent labs & imaging results that were available during my care of the patient were reviewed by me and considered in my medical decision making (see chart  for details).    MDM Rules/Calculators/A&P                           Patient presenting for hallucinations.  On exam, patient appears at his baseline.  Per chart review, patient has an act team that he can follow-up with.  I discussed the importance of following up with this.  He does not appear to be a danger to himself or others at this time as he is not reporting SI or HI.  Will not consult psych as patient was seen twice by psych in the past 24 hours and recommended for discharge.  At this time, patient appears safe for discharge.  Return precautions given.  Patient states he understands and agrees to plan  Final Clinical Impression(s) / ED Diagnoses Final diagnoses:  Hallucinations  Paranoid schizophrenia [facility])    Rx / DC Orders ED Discharge Orders     None        Alveria Apley, PA-C 05/24/21 0051    Mesner, Barbara Cower, MD 05/24/21 0130

## 2021-05-24 NOTE — Progress Notes (Signed)
   05/24/21 1710  [facility] Triage Screening (Walk-ins at [facility] only)  How Did You Hear About [place]? Legal System  What Is the Reason for Your Visit/Call Today? Patient presents voluntarily via [employer] after calling 911 for "mental health help."  Initially, pt told officers he didn't know his name.  He then gave them details when informed he could not be checked in if he didn't provide his name.  Pt is well known to the [facility] team and ED staff.  He asked officers to take him to [facility], likely to be seen at a facility outside of [facility] system.  Upon arrival to [facility], pt gave his name, he denied SI, HI and AVH.  The then only responded with "I don'w know" from this point on.  How Long Has This Been Causing You Problems?  (responds "I don't know")  Have You Recently Had Any Thoughts About Hurting Yourself? No  Are You Planning to Commit Suicide/Harm Yourself At This time? No  Have you Recently Had Thoughts About Hurting Someone [name]? No  Are You Planning To Harm Someone At This Time? No  Are you currently experiencing any auditory, visual or other hallucinations? No  Have You Used Any Alcohol or Drugs in the Past 24 Hours? No  Do you have any current medical co-morbidities that require immediate attention? No  Clinician description of patient physical appearance/behavior: Patient is disheveled, calm and mostly responds "I don't know"  What Do You Feel Would Help You the Most Today? Treatment for Depression or other mood problem  If access to [facility] Care was not available, would you have sought care in the Emergency Department? No  Determination of Need Routine (7 days)  Options For Referral Outpatient Therapy;Medication Management

## 2021-05-24 NOTE — ED Notes (Signed)
Pt discharged home with AVS.  AVS reviewed prior to discharge.  Belongings returned.  Safety maintained

## 2021-05-25 ENCOUNTER — Ambulatory Visit (HOSPITAL_COMMUNITY)
Admission: EM | Admit: 2021-05-25 | Discharge: 2021-05-25 | Disposition: A | Discharge status: Home / Self Care | Payer: Medicaid Other | Attending: Psychiatry | Admitting: Psychiatry

## 2021-05-25 DIAGNOSIS — R5383 Other fatigue: Secondary | ICD-10-CM | POA: Insufficient documentation

## 2021-05-25 DIAGNOSIS — F200 Paranoid schizophrenia: Secondary | ICD-10-CM | POA: Insufficient documentation

## 2021-05-25 NOTE — BH Assessment (Signed)
     aunt/payee Clide Cliff Luck: [phone]

## 2021-05-25 NOTE — Progress Notes (Signed)
TRIAGE: ROUTINE  Pt is familiar with the Behavioral Health Urgent Care [facility]) and additional hospitals within the [facility] system. Pt was recently assessed by the [place]:  05/23/2021 at 1356 05/24/2021 at 1710 05/25/2021 at [place], PA-C, reviewed pt's chart and information and met with pt face-to-face and determined pt can be psych cleared.   05/25/21 0125  [place] Triage Screening (Walk-ins at [facility] only)  How Did You Hear About [place]? Self  What Is the Reason for Your Visit/Call Today? Pt presents to the [facility]) via the [employer] due to stating, "I'm scared for my life right now." When asked what he is scared of, pt states, "I don't know." When asked what the [place] could do in an attempt to assist pt, he states, "I don't know." Pt denies SI, HI, AVH, NSSIB, access to guns/weapons, engagement with the legal system, or SA.  How Long Has This Been Causing You Problems?  (Pt responds, "I don't know.")  Have You Recently Had Any Thoughts About Hurting Yourself? No  Are You Planning to Commit Suicide/Harm Yourself At This time? No  Have you Recently Had Thoughts About [place]? No  Are You Planning To Harm Someone At This Time? No  Are you currently experiencing any auditory, visual or other hallucinations? No  Please explain the hallucinations you are currently experiencing: Pt denies  Have You Used Any Alcohol or Drugs in the Past 24 Hours?  (Pt denies, though he told the [facility] that he has not yet stopped using marijuana.)  How long ago did you use Drugs or Alcohol? Unknown  What Did You Use and How Much? Marijuana - unknown  Do you have any current medical co-morbidities that require immediate attention? No  Clinician description of patient physical appearance/behavior: Pt is dressed in casual clothing. He answers most questions posed with, "I don't know." Pt expressed several times that he was tired; he replies that he does not know where he slept last night.   What Do You Feel Would Help You the Most Today? Medication(s);Treatment for Depression or other mood problem  If access to [facility] was not available, would you have sought care in the Emergency Department? Yes  Determination of Need Routine (7 days)  Options For Referral Outpatient Therapy;Medication Management

## 2021-05-25 NOTE — ED Provider Notes (Addendum)
Behavioral Health Urgent Care Medical Screening Exam  Patient Name: [name] MRN: [id number] Date of Evaluation: 05/25/21 Chief Complaint:  "I'm scared for my life." Diagnosis:  Final diagnoses:  Schizophrenia, paranoid (HCC)    History of Present illness: [name] is a 30 y.o. male with past psychiatric history significant for paranoid schizophrenia, as well as history of many ED, [facility]), and [facility] behavioral health urgent care [facility]) visits over the past 3 to 4 months, who presents to the [facility] voluntarily via [employer] ([employer]).  Similar to previous assessments of this patient, patient is cooperative, but answers most questions with "I don't know".  Patient states that he asked to be brought to the [facility] because "I'm scared for my life".  Similar to previous assessments of this patient, when patient is asked what specifically in life is scaring him, patient states "I don't know".  When patient is asked again what he is scared of in life, patient states "Life. I'm just scared of life."  He does not provide further details.  Per chart review, patient has been evaluated at the behavior health urgent care twice within the past 48 hours, once on 05/23/2021 and once on 05/24/21.   Patient denies SI.  He denies HI.  He denies auditory hallucinations or visual hallucinations.  Patient continues to endorse paranoia but refuses to provide details regarding the specific triggers of his paranoia or what specifically is making him feel paranoid.  Per chart review of 05/24/2021 behavioral health urgent care visit, it was documented Per collateral obtained from patient's [facility] ACTT team at that time that patient is being seen weekly by his ACTT team and saw his [name] team psychiatric provider on May 18, 2021.  When patient is asked if he has followed up with his ACTT team over the past week, patient states "I don't know what they're doing".  Based on  previous documentation, patient is supposed to receive his next [name] injection on June 01, 2021.  When patient is asked about his willingness to take psychotropic medications at this time, patient does not provide an answer.  When patient is asked what he would like for [place] to specifically help him with while he is here at the [facility], patient states "I need to go to sleep. I don't know what to do. I'm just exhausted".  Patient reports sleeping poorly and endorses fatigue secondary to poor sleep.  He reports that he has been staying up all night and sleeping during the day.  When patient is asked about appetite or weight changes, patient states "I don't know".  Patient denies access to firearms or weapons.  Patient continues to deny any alcohol, tobacco, or illegal drug use.  However, it was reported by [facility] that patient told [facility] that he is still currently using marijuana.  Patient reports that over the past week, he has still been living on the streets in [place].  It has been well documented that patient is able to stay with his aunt/payee in [place] as well as that patient has a fully furnished apartment with food and locks on the windows.  When patient is asked why he continues to not want to stay at his aunt's house or live in his apartment in [facility], patient states "I don't know".  Patient verbally contracts for safety.  With patient's consent, collateral information was obtained by Duard Brady, LMFT and myself speaking with patient's aunt/payee Adrian Cliff  Warner: [phone]) via phone.  Patient's aunt states that after the patient was evaluated at [facility] on Thursday, 05/19/2021, the patient return to his apartment in [place] [facility] [place] [facility] on Friday, 05/20/2021 and stayed at his apartment in [place] [facility] [place] [facility] until Sunday, 05/22/2021.  Patient's aunt states that she spoke to the patient on the phone about 1 hour ago and she states that the patient was doing  fine at that time and that the patient had no concerns at that time.  Patient's aunt reports that the patient sees his ACTT team regularly and that his ACTT team nurse brought the patient to [place] recently.  Patient's aunt states that the patient is still his own guardian and does not have a legal guardian at this time, but does state that she picked up some papers from the court house to fill out to eventually obtain guardianship of the patient.  Patient's aunt states that she came to the patient's apartment yesterday on 05/24/2021 in order to cook some food for the patient for the patient to eat and go grocery shopping for the patient, but patient's aunt states that the patient was not at his apartment when she arrived yesterday on 05/24/2021.  Patient's aunt states that she is still at the patient's apartment at this time and she provides the address of patient's apartment ([address], [place], [place] [address]).  Patient's aunt does not have any safety concerns regarding the patient at this time.  Patient's aunt unable to pick the patient up from [facility] at this time.  Per collateral obtained from patient's ACTT team less than 24 hours ago upon earlier 05/24/2021 [facility] evaluation of the patient by [name], NP:   "Collateral Information:  Spoke with [name] of [name] and informed that this is an act put on by patient.  States patient was recently seen and is seen weekly by ACTT and saw his psychiatrist on 05/18/21.  Reports patient goes to ED asking for Benzo.  She is also aware of patient's "I don't know" remark to questions."  Psychiatric Specialty Exam  Presentation  General Appearance:Well Groomed; Appropriate for Environment  Eye Contact:Fair; Fleeting  Speech:Clear and Coherent; Normal Rate  Speech Volume:Normal  Handedness:Right   Mood and Affect  Mood:Euthymic  Affect:Congruent; Flat   Thought Process  Thought Processes:Coherent; Goal Directed  Descriptions of  Associations:Intact  Orientation:Full (Time, Place and Person)  Thought Content:Paranoid Ideation  Diagnosis of Schizophrenia or Schizoaffective disorder in past: Yes  Duration of Psychotic Symptoms: Greater than six months  Hallucinations:None denies auditory/visual hallucinations at this time  Ideas of Reference:Paranoia  Suicidal Thoughts:No  Homicidal Thoughts:No   Sensorium  Memory:-- (UTA at this time due to patient answering most questions asked during the assessment with "I don't know".)  Judgment:Fair  Insight:Lacking   Executive Functions  Concentration:Fair  Attention Span:Fair  Recall:Fair  Fund of Knowledge:Fair  Language:Fair   Psychomotor Activity  Psychomotor Activity:Normal   Assets  Assets:Communication Skills; Financial Resources/Insurance; Housing; Leisure Time; Physical Health; Social Support; Transportation   Sleep  Sleep:Poor  Number of hours: Patient did not provide details regarding number of hours of sleep.   Nutritional Assessment (For OBS and FBC admissions only) Has the patient had a weight loss or gain of 10 pounds or more in the last 3 months?: No Has the patient had a decrease in food intake/or appetite?: No Does the patient have dental problems?: No Does the patient have eating habits or behaviors that may be indicators  of an eating disorder including binging or inducing vomiting?: No Has the patient recently lost weight without trying?: No Has the patient been eating poorly because of a decreased appetite?: No Malnutrition Screening Tool Score: 0   Physical Exam: Physical Exam Vitals reviewed.  Constitutional:      General: He is not in acute distress.    Appearance: Normal appearance. He is not ill-appearing, toxic-appearing or diaphoretic.  HENT:     Head: Normocephalic and atraumatic.     Right Ear: External ear normal.     Left Ear: External ear normal.     Nose: Nose normal.  Eyes:     General:         Right eye: No discharge.        Left eye: No discharge.     Conjunctiva/sclera: Conjunctivae normal.  Cardiovascular:     Rate and Rhythm: Normal rate.  Pulmonary:     Effort: Pulmonary effort is normal. No respiratory distress.  Musculoskeletal:        General: Normal range of motion.     Cervical back: Normal range of motion.  Neurological:     General: No focal deficit present.     Mental Status: He is alert and oriented to person, place, and time.     Comments: No tremor noted.   Psychiatric:        Attention and Perception: He does not perceive auditory or visual hallucinations.        Mood and Affect: Mood normal.        Speech: Speech normal.        Behavior: Behavior is not agitated, slowed, aggressive, withdrawn, hyperactive or combative. Behavior is cooperative.        Thought Content: Thought content is paranoid. Thought content is not delusional. Thought content does not include homicidal or suicidal ideation.     Comments: Affect mood congruent and flat.    Review of Systems  Constitutional:  Positive for malaise/fatigue. Negative for chills, diaphoresis, fever and weight loss.  HENT:  Negative for congestion.   Respiratory:  Negative for cough and shortness of breath.   Cardiovascular:  Negative for chest pain and palpitations.  Gastrointestinal:  Negative for abdominal pain, constipation, diarrhea, nausea and vomiting.  Musculoskeletal:  Negative for joint pain and myalgias.  Neurological:  Negative for dizziness and headaches.  Psychiatric/Behavioral:  Negative for depression, hallucinations, memory loss, substance abuse and suicidal ideas. The patient is nervous/anxious and has insomnia.   All other systems reviewed and are negative.  Vitals: Blood pressure 120/79, pulse 69, temperature 98.4 F (36.9 C), temperature source Oral, resp. rate 18, SpO2 99 %. There is no height or weight on file to calculate BMI.  Musculoskeletal: Strength & Muscle Tone: within  normal limits Gait & Station: normal Patient leans: N/A   [facility] MSE Discharge Disposition for Follow up and Recommendations: Based on my evaluation the patient does not appear to have an emergency medical condition and can be discharged with resources and follow up care in outpatient services for Medication Management, Individual Therapy, and Group Therapy.  Patient denies SI, HI, or AVH.  Patient is not psychotic on exam.  Similar to past presentations, patient does not provide details regarding what he is specifically scheduled in life and patient does not appear to be in any danger at this time based on his current presentation compared to past presentations. Patient is not an imminent risk/threat/danger to himself or others at this time. Patient's paranoia continues  to appear to be at baseline based on patient's current presentation compared to past presentations.  Patient's paranoia and current mental health status has not declined to the point that patient meets inpatient psychiatric treatment criteria. Patient continues to not meet inpatient psychiatric treatment criteria, overnight observation criteria, or [facility] FBC criteria at this time.  Continue to recommend that patient follow up with his ACTT Team through [facility] for psychiatry/medication management and therapy.  Safety planning done at length with the patient regarding appropriate actions to take/resources to utilize [facility], nearest ED, 911) if the patient becomes suicidal or homicidal, if the patient's condition rapidly deteriorates/worsen/does not improve, or if the patient begins to experience psychosis or mental health crisis.  Patient's [facility] is [facility] at [facility]. Patient is willing to return [place] his [place] in [place] at this time. Upon nursing staff contacting [employer] to come pick the patient up [facility], it was inquired about if the patient could be transported to [place] [facility] and [employer] states that they can [facility] the [facility] and [place]. Patient's apartment address provided to [employer] with patient's consent.   All patient's questions answered and concerns addressed.  Patient discharged home to his [place] in [place] via [employer].  Jaclyn Shaggyody W Manon [place], PA-C 05/25/2021, 2:58 AM

## 2021-05-25 NOTE — Discharge Instructions (Signed)

## 2021-05-25 NOTE — ED Notes (Signed)
Pt waiting for [employer] to pick him up and take him home. They do no thave a ETA of pickup of pt. Pt is sleeping in assessment room at this time

## 2021-05-26 ENCOUNTER — Other Ambulatory Visit: Payer: Self-pay

## 2021-05-26 ENCOUNTER — Emergency Department (HOSPITAL_COMMUNITY)
Admission: EM | Admit: 2021-05-26 | Discharge: 2021-05-27 | Disposition: A | Discharge status: Home / Self Care | Payer: Medicaid - Out of State | Attending: Emergency Medicine | Admitting: Emergency Medicine

## 2021-05-26 ENCOUNTER — Encounter (HOSPITAL_COMMUNITY): Payer: Self-pay | Admitting: Emergency Medicine

## 2021-05-26 DIAGNOSIS — R443 Hallucinations, unspecified: Secondary | ICD-10-CM | POA: Diagnosis present

## 2021-05-26 DIAGNOSIS — F17210 Nicotine dependence, cigarettes, uncomplicated: Secondary | ICD-10-CM | POA: Insufficient documentation

## 2021-05-26 DIAGNOSIS — R7309 Other abnormal glucose: Secondary | ICD-10-CM | POA: Diagnosis not present

## 2021-05-26 NOTE — BH Assessment (Signed)
Comprehensive Clinical Assessment (CCA) Note  05/27/2021 [name] [id number]  Disposition: [place] Abtsody Taylor, PA, patient does not meet inpatient criteria. Patient is psych cleared. Patient will follow up with [name]. Morrie SheldonAshley, RN informed of disposition.  The patient demonstrates the following risk factors for suicide: Chronic risk factors for suicide include: psychiatric disorder of Schizoaffective disorder, Bipolar type and demographic factors (male, 48>60 y/o). Acute risk factors for suicide include: social withdrawal/isolation. Protective factors for this patient include: positive therapeutic relationship. Considering these factors, the overall suicide risk at this point appears to be none. Patient is not appropriate for outpatient follow up.  Flowsheet Row ED from 05/26/2021 in [facility] from 05/23/2021 in [facility] Pre-admit (Canceled) from [facility]  C-SSRS RISK CATEGORY No Risk No Risk No Risk      [name] is a 30 year old male [facility] per ED. When asked why are you here, patient stated "I need a chill pill". Patient reported to triage that he feels weird after smoking marijuana. Patient has a diagnosis of schizophrenia and a history of medication noncompliance. He has had approximately 68 presentations to EDs, [facility], and [facility] within the past six months. [facility]. Patient medical record indicates his next dose Invega Trinza due 06/01/2021. Patient was calm and cooperative during assessment.   Collateral contact ACT Team [facility] [phone]), consent given by patient Left contact information with Adrian Jonesarolyn, as administrator on-call did not answer.  Chief Complaint:  Chief Complaint  Patient presents with   Wellness Check   Visit Diagnosis:  F25.0, Schizoaffective disorder, Bipolar  type  CCA Screening, Triage and Referral (STR)  Patient Reported Information How did you hear about us? Self  What Is the Reason for Your Visit/Call Today? Wellness check  How Long Has This Been Causing You Problems? 1 wk - 1 month  What Do You Feel Would Help You the Most Today? Medication(s) ("a chill pill")  Have You Recently Had Any Thoughts About Hurting Yourself? No  Are You Planning to Commit Suicide/Harm Yourself At This time? No  Have you Recently Had Thoughts About Hurting Someone Karolee Ohslse? No  Are You Planning to Harm Someone at This Time? No  Explanation: No data recorded  Have You Used Any Alcohol or Drugs in the Past 24 Hours? No  How Long Ago Did You Use Drugs or Alcohol? 0000 (Unclear)  What Did You Use and How Much? Marijuana - unknown  Do You Currently Have a Therapist/Psychiatrist? Yes  Name of Therapist/Psychiatrist: ACT Team  Have You Been Recently Discharged From Any Office Practice or Programs? No  Explanation of Discharge From Practice/Program: No data recorded   CCA Screening Triage Referral Assessment Type of Contact: Tele-Assessment  Telemedicine Service Delivery:   Is this Initial or Reassessment? Initial Assessment  Date Telepsych consult ordered in [facility]:  05/26/21  [facility]  Location of Assessment: [facility]  Provider [facility]  Collateral Involvement: Call placed successfully to pt's aunt and payee, Ms. Thomes LollingLuck [phone]) with pt's permission.  Does Patient Have a Automotive engineerCourt Appointed Legal Guardian? No data recorded Name and Contact of Legal Guardian: [name], legal guardian.   If Minor and Not Living with Parent(s), Who has Custody? N/A  Is CPS involved or ever been involved? Never  Is APS involved or ever been involved? Never  Patient Determined To Be At Risk for Harm To Self or Others Based on Review of Patient Reported Information or Presenting Complaint?  No  Method: No Plan  Availability of Means: No access or NA  Intent: Vague intent or NA  Notification Required: No need or identified person  Additional Information for Danger to Others Potential: Active psychosis  Additional Comments for Danger to Others Potential: History of children playing outside triggering patient.  Are There Guns or Other Weapons in Your Home? No  Types of Guns/Weapons: No data recorded Are These Weapons Safely Secured?                            No data recorded Who Could Verify You Are Able To Have These Secured: No data recorded Do You Have any Outstanding Charges, Pending Court Dates, Parole/Probation? None noted  Contacted To Inform of Risk of Harm To Self or Others: Other: Comment (N/A)  Does Patient Present under Involuntary Commitment? No  IVC Papers Initial File Date: 10/22/20  [place] of Residence: [place]  Patient Currently Receiving the Following Services: ACTT Psychologist, educational)  Determination of Need: Routine (7 days)  Options For Referral: Other: Comment (ACT Team)  CCA Biopsychosocial Patient Reported Schizophrenia/Schizoaffective Diagnosis in Past: Yes  Strengths: Pt is linked to [employer] for ACT Team.  Mental Health Symptoms Depression:   None   Duration of Depressive symptoms:    Mania:   None   Anxiety:    Worrying; Restlessness   Psychosis:   None   Duration of Psychotic symptoms:    Trauma:   None   Obsessions:   None   Compulsions:   None   Inattention:   N/A   Hyperactivity/Impulsivity:   N/A   Oppositional/Defiant Behaviors:   N/A   Emotional Irregularity:   N/A   Other Mood/Personality Symptoms:   None noted    Mental Status Exam Appearance and self-care  Stature:   Average   Weight:   Average weight   Clothing:   Age-appropriate; Casual   Grooming:   Normal   Cosmetic use:   None   Posture/gait:   Normal   Motor activity:   Not Remarkable   Sensorium   Attention:   Normal   Concentration:   Normal   Orientation:   X5   Recall/memory:   Normal   Affect and Mood  Affect:   Blunted; Flat   Mood:   Euthymic   Relating  Eye contact:   Normal   Facial expression:   Responsive   Attitude toward examiner:   Cooperative   Thought and Language  Speech flow:  Paucity   Thought content:   Appropriate to Mood and Circumstances   Preoccupation:   None   Hallucinations:   None   Organization:  No data recorded  [employer] of Knowledge:   Poor   Intelligence:   Needs investigation   Abstraction:   Concrete   Judgement:   Poor   Reality Testing:   Adequate   Insight:   Poor   Decision Making:   Impulsive   Social Functioning  Social Maturity:   Impulsive   Social Judgement:   Heedless   Stress  Stressors:   Housing; Surveyor, quantity   Coping Ability:   Normal   Skill Deficits:   Communication; Decision making; Self-care; Self-control   Supports:   Support needed    Religion: Religion/Spirituality Are You A  Religious Person?:  (Not assessed) How Might This Affect Treatment?: Not assessed  Leisure/Recreation: Leisure / Recreation Do You Have Hobbies?: Yes Leisure and Hobbies: Football  Exercise/Diet: Exercise/Diet Do You Exercise?: No Have You Gained or Lost A Significant Amount of Weight in the Past Six Months?: No Do You Follow a Special Diet?: No Do You Have Any Trouble Sleeping?: No Explanation of Sleeping Difficulties: Pt denies  CCA Employment/Education Employment/Work Situation: Employment / Work Systems developer: On disability Why is Patient on Disability: Mental health How Long has Patient Been on Disability: UTA Patient's Job has Been Impacted by Current Illness:  (N/A) Has Patient ever Been in the [employer]?: No  Education: Education Last Grade Completed: 9 Did You Attend College?: No Did You Have An Individualized Education Program  (IIEP): No Did You Have Any Difficulty At School?: No  CCA Family/Childhood History Family and Relationship History: Family history Marital status: Single Does patient have children?: No  Childhood History:  Childhood History By whom was/is the patient raised?: Other (Comment) (Not assessed) Did patient suffer any verbal/emotional/physical/sexual abuse as a child?:  (Pt states "I don't know") Did patient suffer from severe childhood neglect?:  (Pt states "I don't know") Has patient ever been sexually abused/assaulted/raped as an adolescent or adult?:  (Pt states "I don't know") Was the patient ever a victim of a crime or a disaster?:  (Pt states "I don't know") Witnessed domestic violence?:  (Pt states "I don't know") Has patient been affected by domestic violence as an adult?:  (Pt states "I don't know")  Child/Adolescent Assessment:   CCA Substance Use Alcohol/Drug Use: Alcohol / Drug Use Pain Medications: See MAR Prescriptions: See MAR Over the Counter: See MAR History of alcohol / drug use?: Yes Longest period of sobriety (when/how long): Unknown Negative Consequences of Use:  (Pt denies) Withdrawal Symptoms:  (Pt denies) Substance #1 Name of Substance 1: Marijuana 1 - Age of First Use: UTA 1 - Amount (size/oz): UTA 1 - Frequency: UTA 1 - Duration: UTA 1 - Last Use / Amount: Several days ago pt admitted to the use of marijuana; today pt denies any use 1 - Method of Aquiring: UTA Substance #2 Name of Substance 2: Alcohol 2 - Age of First Use: Teens 2 - Amount (size/oz): Varied 2 - Frequency: Varied 2 - Duration: Unknown 2 - Last Use / Amount: Earlier tonight pt admitted to the use of EtOH today; pt now denies any use 2 - Method of Aquiring: Unknown 2 - Route of Substance Use: Drinking   ASAM's:  Six Dimensions of Multidimensional Assessment  Dimension 1:  Acute Intoxication and/or Withdrawal Potential:   Dimension 1:  Description of individual's past and current  experiences of substance use and withdrawal: Pt reports that he use to smoke marijuana.  Dimension 2:  Biomedical Conditions and Complications:   Dimension 2:  Description of patient's biomedical conditions and  complications: Pt did not report medical  Dimension 3:  Emotional, Behavioral, or Cognitive Conditions and Complications:  Dimension 3:  Description of emotional, behavioral, or cognitive conditions and complications: Schizophrenia, Paranoid  Dimension 4:  Readiness to Change:  Dimension 4:  Description of Readiness to Change criteria: Pt reports that he is not taking medicaiton  Dimension 5:  Relapse, Continued use, or Continued Problem Potential:  Dimension 5:  Relapse, continued use, or continued problem potential critiera description: Precontemplation  Dimension 6:  Recovery/Living Environment:  Dimension 6:  Recovery/Iiving environment criteria description: Pt reports that he is homeless  ASAM Severity Score: ASAM's Severity Rating Score: 9  ASAM Recommended Level of Treatment: ASAM Recommended Level of Treatment: Level I Outpatient Treatment   Substance use Disorder (SUD) Substance Use Disorder (SUD)  Checklist Symptoms of Substance Use: Continued use despite having a persistent/recurrent physical/psychological problem caused/exacerbated by use, Continued use despite persistent or recurrent social, interpersonal problems, caused or exacerbated by use  Recommendations for Services/Supports/Treatments: Recommendations for Services/Supports/Treatments Recommendations For Services/Supports/Treatments: ACCTT (Assertive Community Treatment)  Discharge Disposition:   DSM5 Diagnoses: Patient Active Problem List   Diagnosis Date Noted   Malingering 05/24/2021   Homelessness 05/24/2021   Alcohol abuse 03/21/2021   Substance-induced disorder (HCC) 01/02/2021   Schizophrenia, paranoid type (HCC) 10/24/2020   Paranoid schizophrenia (HCC) 01/05/2020   Other schizophrenia (HCC) 02/14/2018    Cannabis use disorder, moderate, dependence (HCC) 05/10/2016   Referrals to Alternative Service(s): Referred to Alternative Service(s):   Place:   Date:   Time:    Referred to Alternative Service(s):   Place:   Date:   Time:    Referred to Alternative Service(s):   Place:   Date:   Time:    Referred to Alternative Service(s):   Place:   Date:   Time:     Burnetta Sabin, [facility]

## 2021-05-26 NOTE — ED Provider Notes (Signed)
Emergency Medicine Provider Triage Evaluation Note  [name] , a 30 y.o. male  was evaluated in triage.  Pt complains of feeling "weird" after smoking marijuana. Denies hallucinations  Review of Systems  Positive: Drug use Negative: hallucinations  Physical Exam  BP 122/79 (BP Location: Left Arm)   Pulse 83   Temp 97.9 F (36.6 C) (Oral)   Resp 18   SpO2 97%  Gen:   Awake, no distress   Resp:  Normal effort  MSK:   Moves extremities without difficulty  Other:    Medical Decision Making  Medically screening exam initiated at 10:37 PM.  Appropriate orders placed.  [name] was informed that the remainder of the evaluation will be completed by another provider, this initial triage assessment does not replace that evaluation, and the importance of remaining in the ED until their evaluation is complete.  Feels funny after smoking weed.  No other complaints.  No orders placed at this time   Adrian Captain, PA-C 05/26/21 2243    Adrian Leiter, DO 05/26/21 2330

## 2021-05-26 NOTE — ED Provider Notes (Signed)
[facility]-EMERG[facility] DEPT Provider Note   CSN: [id number] Arrival date & time: 05/26/21  2220     History Chief Complaint  Patient presents with   Wellness Check    [name] is a 30 y.o. male.  .  Patient with past medical history notable for malingering, homelessness, alcohol abuse, substance-induced disorder, schizophrenia, paranoid schizophrenia, presents to the emergency department with a chief complaint of hallucinations.  He states that he has been "seeing things."  He is not able to tell me what he has been seeing.  He states that he cannot function, and that he has schizophrenia.  He states that he needs help with his medications.  The history is provided by the patient. No language interpreter was used.      Past Medical History:  Diagnosis Date   Depression    Schizophrenia [facility])     Patient Active Problem List   Diagnosis Date Noted   Malingering 05/24/2021   Homelessness 05/24/2021   Alcohol abuse 03/21/2021   Substance-induced disorder (HCC) 01/02/2021   Schizophrenia, paranoid type (HCC) 10/24/2020   Paranoid schizophrenia (HCC) 01/05/2020   Other schizophrenia (HCC) 02/14/2018   Cannabis use disorder, moderate, dependence (HCC) 05/10/2016    Past Surgical History:  Procedure Laterality Date   BACK SURGERY         No family history on file.  Social History   Tobacco Use   Smoking status: Some Days    Types: Cigarettes   Smokeless tobacco: Never  Vaping Use   Vaping Use: Never used  Substance Use Topics   Alcohol use: No   Drug use: Yes    Types: Marijuana    Comment: rare    Home Medications Prior to Admission medications   Medication Sig Start Date End Date Taking? Authorizing Provider  PRESCRIPTION MEDICATION See admin instructions. Unnamed stabilizing injection- Every 90 days    [provider]  paliperidone (INVEGA) 6 MG 24 hr tablet Take 1 tablet (6 mg total) by mouth daily for 14 days. Patient  not taking: No sig reported 01/02/21 02/22/21  Mare Ferrari, PA-C    Allergies    Shellfish allergy and Bee pollen  Review of Systems   Review of Systems  All other systems reviewed and are negative.  Physical Exam Updated Vital Signs BP 122/79 (BP Location: Left Arm)   Pulse 83   Temp 97.9 F (36.6 C) (Oral)   Resp 18   SpO2 97%   Physical Exam Vitals and nursing note reviewed.  Constitutional:      General: He is not in acute distress.    Appearance: He is well-developed. He is not ill-appearing.  HENT:     Head: Normocephalic and atraumatic.  Eyes:     Conjunctiva/sclera: Conjunctivae normal.  Cardiovascular:     Rate and Rhythm: Normal rate.  Pulmonary:     Effort: Pulmonary effort is normal. No respiratory distress.  Abdominal:     General: There is no distension.  Musculoskeletal:        General: Normal range of motion.     Cervical back: Neck supple.     Comments: Moves all extremities  Skin:    General: Skin is warm and dry.  Neurological:     Mental Status: He is alert and oriented to person, place, and time.  Psychiatric:        Mood and Affect: Mood normal.        Behavior: Behavior normal.  ED Results / Procedures / Treatments   Labs (all labs ordered are listed, but only abnormal results are displayed) Labs Reviewed - No data to display  EKG None  Radiology No results found.  Procedures Procedures   Medications Ordered in ED Medications - No data to display  ED Course  I have reviewed the triage vital signs and the nursing notes.  Pertinent labs & imaging results that were available during my care of the patient were reviewed by me and considered in my medical decision making (see chart for details).    MDM Rules/Calculators/A&P                           Patient here with visual hallucinations.  Triage note states that the symptoms started after smoking marijuana, but patient does not admit to this.  He states that he is having  trouble thinking, and needs help with his medications.  Creatinine mildly increased from baseline, otherwise labs are reassuring.  Patient appears medically clear for TTS evaluation. Final Clinical Impression(s) / ED Diagnoses Final diagnoses:  Hallucinations    Rx / DC Orders ED Discharge Orders     None        Roxy Horseman, PA-C 05/27/21 0149    [name], MD 05/27/21 [phone]

## 2021-05-26 NOTE — ED Triage Notes (Signed)
Patient states he smoked an unknown substance and states he feels weird now. No other complaints.

## 2021-05-27 ENCOUNTER — Ambulatory Visit (HOSPITAL_COMMUNITY)
Admission: EM | Admit: 2021-05-27 | Discharge: 2021-05-27 | Disposition: A | Discharge status: Home / Self Care | Payer: Medicaid Other | Attending: Psychiatry | Admitting: Psychiatry

## 2021-05-27 DIAGNOSIS — Z765 Malingerer [conscious simulation]: Secondary | ICD-10-CM | POA: Insufficient documentation

## 2021-05-27 DIAGNOSIS — F200 Paranoid schizophrenia: Secondary | ICD-10-CM | POA: Insufficient documentation

## 2021-05-27 LAB — CBC WITH DIFFERENTIAL/PLATELET
Abs Immature Granulocytes: 0.01 10*3/uL (ref 0.00–0.07)
Basophils Absolute: 0 10*3/uL (ref 0.0–0.1)
Basophils Relative: 0 %
Eosinophils Absolute: 0.3 10*3/uL (ref 0.0–0.5)
Eosinophils Relative: 6 %
HCT: 43 % (ref 39.0–52.0)
Hemoglobin: 14.7 g/dL (ref 13.0–17.0)
Immature Granulocytes: 0 %
Lymphocytes Relative: 51 %
Lymphs Abs: 2.6 10*3/uL (ref 0.7–4.0)
MCH: 30.7 pg (ref 26.0–34.0)
MCHC: 34.2 g/dL (ref 30.0–36.0)
MCV: 89.8 fL (ref 80.0–100.0)
Monocytes Absolute: 0.4 10*3/uL (ref 0.1–1.0)
Monocytes Relative: 8 %
Neutro Abs: 1.8 10*3/uL (ref 1.7–7.7)
Neutrophils Relative %: 35 %
Platelets: 214 10*3/uL (ref 150–400)
RBC: 4.79 MIL/uL (ref 4.22–5.81)
RDW: 12.7 % (ref 11.5–15.5)
WBC: 5.1 10*3/uL (ref 4.0–10.5)
nRBC: 0 % (ref 0.0–0.2)

## 2021-05-27 LAB — ETHANOL: Alcohol, Ethyl (B): <10 mg/dL (ref <10)

## 2021-05-27 LAB — COMPREHENSIVE METABOLIC PANEL
ALT: 10 U/L (ref 0–44)
AST: 17 U/L (ref 15–41)
Albumin: 4.1 g/dL (ref 3.5–5.0)
Alkaline Phosphatase: 36 U/L — ABNORMAL LOW (ref 38–126)
Anion gap: 9 (ref 5–15)
BUN: 16 mg/dL (ref 6–20)
CO2: 27 mmol/L (ref 22–32)
Calcium: 9.4 mg/dL (ref 8.9–10.3)
Chloride: 104 mmol/L (ref 98–111)
Creatinine, Ser: 1.26 mg/dL — ABNORMAL HIGH (ref 0.61–1.24)
GFR, Estimated: >60 mL/min (ref >60)
Glucose, Bld: 94 mg/dL (ref 70–99)
Potassium: 4.1 mmol/L (ref 3.5–5.1)
Sodium: 140 mmol/L (ref 135–145)
Total Bilirubin: 0.7 mg/dL (ref 0.3–1.2)
Total Protein: 6.6 g/dL (ref 6.5–8.1)

## 2021-05-27 LAB — CBG MONITORING, ED: Glucose-Capillary: 102 mg/dL — ABNORMAL HIGH (ref 70–99)

## 2021-05-27 LAB — SALICYLATE LEVEL: Salicylate Lvl: <7 mg/dL — ABNORMAL LOW (ref 7.0–30.0)

## 2021-05-27 LAB — ACETAMINOPHEN LEVEL: Acetaminophen (Tylenol), Serum: <10 ug/mL — ABNORMAL LOW (ref 10–30)

## 2021-05-27 NOTE — Discharge Instructions (Addendum)
Follow-up with your psychiatry team.  Return to the ED with worsening symptoms.

## 2021-05-27 NOTE — Discharge Instructions (Addendum)
Continue follow up with the ACTT Team through [facility] for psychiatry/medication management and therapy.

## 2021-05-27 NOTE — ED Provider Notes (Addendum)
Behavioral Health Urgent Care Medical Screening Exam  Patient Name: [name] MRN: [id number] Date of Evaluation: 05/27/21 Chief Complaint:  "I am scared for my life." Diagnosis:  Final diagnoses:  Malingering  Schizophrenia, paranoid type (HCC)    History of Present illness: [name] is a 30 y.o. male Patient presents to the [facility] voluntarily as a walk-in accompanied by [employer] after he called the police from the bus depo.   Patient seen and assessed face to face by this provider and chart reviewed. Patient is alert and oriented to person and place. He is mood is euthymic and he is smiling at this provider as I entered to the consult room to begin the assessment. He is calm and cooperative. He states that he is here because "I am scared. I am scared about life right now. I don't like people messing with me when I am trying to sleep. I need a medication like xanax to help my mind." He does not appear to be in distress. He states that he is high off weed and that he is tripping. When asked how long has he felt scared for his life, he states "I don't know." When asked if something happened to cause him to feel scared for his life, he states "I don't know." He denies thoughts of wanting to kill himself. He denies thoughts of wanting to hurt others. He denies hearing voices or seeing things that other people cannot see or hear. He does not appear to be responding to internal or external stimuli. When asked about his appetite and sleep patterns, he states "I don't know." He states that he has not seen the ACT team for medications. He was advised that according to the notes he receives [name] every 3 months. He was advised to follow up with the ACT team for medication management/adjustments.   Patient gave verbal consent for this provider to contact his aunt [name] at [phone] for collateral. [name] states that [name] has an apartment in [facility] and that he wanted to come to [place] today. She states that he doesn't like staying at his apartment in [place] [place] [facility] because he doesn't know a single person in [place]. She states that everyone he knows lives in [place] so he will ask to come to [place] to [place]. She denies any safety concerns with him returning home this evening. She states that she is currently at his apartment and will wait for him to return home. She is unable to pick him up because she does not have transportation. Patient transported home via safe transport. Address confirmed ([address], [place], [place] [address]).     Attempted to call [name] (ACT team nurse) at [phone], no answer.   Per chat review: Per collateral obtained from patient's ACTT team on 05/24/2021 at [facility] by Adrian Found, NP: "Collateral Information:  Spoke with [name] of [name] and informed that this is an act put on by patient.  States patient was recently seen and is seen weekly by ACTT and saw his psychiatrist on 05/18/21.  Reports patient goes to ED asking for Benzo.  She is also aware of patient's "I don't know" remark to questions."    Based on previous documentation, patient is supposed to receive his next [name] injection on June 01, 2021.   Psychiatric Specialty Exam  Presentation  General Appearance:Appropriate for Environment; Well Groomed  Eye Contact:Fair  Speech:Clear and Coherent  Speech Volume:Normal  Handedness:Right   Mood and Affect  Mood:Euthymic  Affect:Congruent   Thought Process  Thought Processes:Coherent  Descriptions of Associations:Intact  Orientation:Partial  Thought Content:Paranoid Ideation  Diagnosis of Schizophrenia or Schizoaffective disorder in past: Yes  Duration of Psychotic Symptoms: Greater than six months  Hallucinations:None denies auditory/visual hallucinations at this time  Ideas of Reference:Paranoia  Suicidal Thoughts:No  Homicidal  Thoughts:No   Sensorium  Memory:Immediate Fair; Recent Fair  Judgment:Intact  Insight:Present   Executive Functions  Concentration:Fair  Attention Span:Fair  Recall:Fair  [employer] of Knowledge:Fair  Language:Fair   Psychomotor Activity  Psychomotor Activity:Normal   Assets  Assets:Communication Skills; Housing; Desire for Improvement; Leisure Time; Physical Health; Social Support   Sleep  Sleep:Fair  Number of hours: 0 (Patient did not provide details regarding number of hours of sleep.)   No data recorded  Physical Exam: Physical Exam HENT:     Head: Normocephalic.  Eyes:     Conjunctiva/sclera: Conjunctivae normal.  Cardiovascular:     Rate and Rhythm: Normal rate.  Pulmonary:     Effort: Pulmonary effort is normal.  Musculoskeletal:     Cervical back: Normal range of motion.  Neurological:     Mental Status: He is alert.   Review of Systems  Constitutional: Negative.   HENT: Negative.    Eyes: Negative.   Respiratory: Negative.    Cardiovascular: Negative.   Gastrointestinal: Negative.   Genitourinary: Negative.   Musculoskeletal: Negative.   Skin: Negative.   Neurological: Negative.   Endo/Heme/Allergies: Negative.   Psychiatric/Behavioral:  Negative for hallucinations and suicidal ideas.   Blood pressure (!) 113/43, pulse 78, temperature 98.4 F (36.9 C), temperature source Oral, resp. rate 18, SpO2 99 %. There is no height or weight on file to calculate BMI.  Musculoskeletal: Strength & Muscle Tone: within normal limits Gait & Station: normal Patient leans: N/A   [facility] MSE Discharge Disposition for Follow up and Recommendations: Based on my evaluation the patient does not appear to have an emergency medical condition and can be discharged with resources and follow up care in outpatient services for Medication Management, Individual Therapy, and Group Therapy  Patient's paranoia continues to appear to be at baseline based on patient's  current presentation compared to past presentations. Patient's paranoia and current mental health status has not declined to the point that patient meets inpatient psychiatric treatment criteria.   Follow up with his ACTT Team through [facility] for psychiatry/medication management and therapy.   Safety planning done at length with the patient and his aunt [name] regarding resources [facility], nearest ED, 911) if the patient becomes suicidal or homicidal, if the patient's condition rapidly deteriorates/worsen/does not improve, or if the patient begins to experience psychosis or mental health crisis.  Layla Barter, NP 05/27/2021, 5:58 PM

## 2021-05-27 NOTE — ED Provider Notes (Signed)
Patient has been seen by TTS and cleared for discharge.  He is not suicidal, not homicidal and not having hallucinations He will follow-up with his outpatient providers   [name], MD 05/27/21 0730

## 2021-05-27 NOTE — ED Notes (Signed)
Pt refused covid swab. °

## 2021-05-27 NOTE — Progress Notes (Signed)
   05/27/21 1745  [facility] Triage Screening (Walk-ins at [facility] only)  How Did You Hear About [place]? Legal System  What Is the Reason for Your Visit/Call Today? Patient presents via [employer] [place] bus dep[place].  Patient walked to an assessment room from [facility] for triage.  He is lying on the couch in the dark stating he needs "help."  He states he is "scared."  He does not elaborate and states he is "just scared of life."  Patient denies SI, HI and AVH.  He states he needs help with finding a place to stay.  How Long Has This Been Causing You Problems? <Week  Have You Recently Had Any Thoughts About Hurting Yourself? No  Are You Planning to Commit Suicide/Harm Yourself At This time? No  Have you Recently Had Thoughts About Hurting Someone [name]? No  Are You Planning To Harm Someone At This Time? No  Are you currently experiencing any auditory, visual or other hallucinations? No  Have You Used Any Alcohol or Drugs in the Past 24 Hours? No  How long ago did you use Drugs or Alcohol? Unknown  What Did You Use and How Much? THC, unknown date of last use or amount - "I don't know"  Clinician description of patient physical appearance/behavior: Patient is dressed casually.  He presents as "scared" with lights off in room.  He answers most questions with "I don't know."  What Do You Feel Would Help You the Most Today? Housing Assistance (Has an apartment, requesting a "place to stay")  If access to [facility] was not available, would you have sought care in the Emergency Department? Yes  Options For Referral Other: Comment (Would benefit from ACTT services)

## 2021-05-30 ENCOUNTER — Emergency Department (HOSPITAL_COMMUNITY)
Admission: EM | Admit: 2021-05-30 | Discharge: 2021-05-31 | Disposition: A | Discharge status: Home / Self Care | Payer: Medicaid - Out of State | Attending: Emergency Medicine | Admitting: Emergency Medicine

## 2021-05-30 ENCOUNTER — Encounter (HOSPITAL_COMMUNITY): Payer: Self-pay

## 2021-05-30 DIAGNOSIS — Z79899 Other long term (current) drug therapy: Secondary | ICD-10-CM | POA: Insufficient documentation

## 2021-05-30 DIAGNOSIS — H53149 Visual discomfort, unspecified: Secondary | ICD-10-CM | POA: Diagnosis not present

## 2021-05-30 DIAGNOSIS — H579 Unspecified disorder of eye and adnexa: Secondary | ICD-10-CM

## 2021-05-30 DIAGNOSIS — Z20822 Contact with and (suspected) exposure to covid-19: Secondary | ICD-10-CM | POA: Insufficient documentation

## 2021-05-30 DIAGNOSIS — F200 Paranoid schizophrenia: Secondary | ICD-10-CM | POA: Diagnosis present

## 2021-05-30 DIAGNOSIS — Y900 Blood alcohol level of less than 20 mg/100 ml: Secondary | ICD-10-CM | POA: Insufficient documentation

## 2021-05-30 DIAGNOSIS — R443 Hallucinations, unspecified: Secondary | ICD-10-CM | POA: Diagnosis not present

## 2021-05-30 DIAGNOSIS — F17210 Nicotine dependence, cigarettes, uncomplicated: Secondary | ICD-10-CM | POA: Diagnosis not present

## 2021-05-30 LAB — CBC WITH DIFFERENTIAL/PLATELET
Abs Immature Granulocytes: 0.01 10*3/uL (ref 0.00–0.07)
Basophils Absolute: 0 10*3/uL (ref 0.0–0.1)
Basophils Relative: 1 %
Eosinophils Absolute: 0.3 10*3/uL (ref 0.0–0.5)
Eosinophils Relative: 6 %
HCT: 43.2 % (ref 39.0–52.0)
Hemoglobin: 14.8 g/dL (ref 13.0–17.0)
Immature Granulocytes: 0 %
Lymphocytes Relative: 48 %
Lymphs Abs: 2 10*3/uL (ref 0.7–4.0)
MCH: 31 pg (ref 26.0–34.0)
MCHC: 34.3 g/dL (ref 30.0–36.0)
MCV: 90.6 fL (ref 80.0–100.0)
Monocytes Absolute: 0.3 10*3/uL (ref 0.1–1.0)
Monocytes Relative: 8 %
Neutro Abs: 1.6 10*3/uL — ABNORMAL LOW (ref 1.7–7.7)
Neutrophils Relative %: 37 %
Platelets: 193 10*3/uL (ref 150–400)
RBC: 4.77 MIL/uL (ref 4.22–5.81)
RDW: 12.5 % (ref 11.5–15.5)
WBC: 4.2 10*3/uL (ref 4.0–10.5)
nRBC: 0 % (ref 0.0–0.2)

## 2021-05-30 LAB — COMPREHENSIVE METABOLIC PANEL
ALT: 11 U/L (ref 0–44)
AST: 18 U/L (ref 15–41)
Albumin: 4.3 g/dL (ref 3.5–5.0)
Alkaline Phosphatase: 36 U/L — ABNORMAL LOW (ref 38–126)
Anion gap: 7 (ref 5–15)
BUN: 15 mg/dL (ref 6–20)
CO2: 29 mmol/L (ref 22–32)
Calcium: 9.2 mg/dL (ref 8.9–10.3)
Chloride: 101 mmol/L (ref 98–111)
Creatinine, Ser: 1.29 mg/dL — ABNORMAL HIGH (ref 0.61–1.24)
GFR, Estimated: >60 mL/min (ref >60)
Glucose, Bld: 82 mg/dL (ref 70–99)
Potassium: 4 mmol/L (ref 3.5–5.1)
Sodium: 137 mmol/L (ref 135–145)
Total Bilirubin: 0.9 mg/dL (ref 0.3–1.2)
Total Protein: 6.8 g/dL (ref 6.5–8.1)

## 2021-05-30 LAB — RAPID URINE DRUG SCREEN, HOSP PERFORMED
Amphetamines: NOT DETECTED
Barbiturates: NOT DETECTED
Benzodiazepines: NOT DETECTED
Cocaine: NOT DETECTED
Opiates: NOT DETECTED
Tetrahydrocannabinol: NOT DETECTED

## 2021-05-30 LAB — ETHANOL: Alcohol, Ethyl (B): <10 mg/dL (ref <10)

## 2021-05-30 LAB — RESP PANEL BY RT-PCR (FLU A&B, COVID) ARPGX2
Influenza A by PCR: NEGATIVE
Influenza B by PCR: NEGATIVE
SARS Coronavirus 2 by RT PCR: NEGATIVE

## 2021-05-30 NOTE — ED Triage Notes (Signed)
Patient [place] via [facility] [place] [address]   Called out for behavioral episodes and hallucinations.   Denies SI or HI.   Patient states he wanted to come to hospital to be checked out. States he does not feel safe.   Pt reported to EMS that he could not see but passed ems eye exam. Was able to sign his signature and walk to ems truck with no issues.   Reports alcohol intake. Denies other drugs.   A/Ox4

## 2021-05-30 NOTE — BH Assessment (Addendum)
Comprehensive Clinical Assessment (CCA) Note  05/30/2021 [name] [id number]  DISPOSITION: [place] NP recommends patient be observed and monitored.   Flowsheet Row ED from 05/30/2021 in [facility] from 05/26/2021 in [facility] Pre-admit (Canceled) from [facility]  C-SSRS RISK CATEGORY No Risk No Risk No Risk      The patient demonstrates the following risk factors for suicide: Chronic risk factors for suicide include: N/A. Acute risk factors for suicide include: N/A. Protective factors for this patient include: coping skills. Considering these factors, the overall suicide risk at this point appears to be low. Patient is not appropriate for outpatient follow up.   Patient is a 30 year old male that presents this date voluntary with ongoing AVH. Patient is vague in reference to content stating "I just hear and see things out there man." Patient renders conflicting history denying per notes, any S/I or H/I although at the time of assessment reports ongoing H/I towards "anyone in his court." Patient has a history of Schizophrenia and is well known to area providers. Patient was seen on 05/27/21 when he presented to [facility] with similar symptoms. Patient per chart review patient has been seen more than 6 times in the last month each time with similar presentation. Patient currently receives services from [facility] who assists with ongoing needs and medication management.  Based on previous documentation, patient is supposed to receive his next [name] injection on June 01, 2021. Patient will not render any additional history and is observed to face the wall in his bed as he pulls the covers over his head. Patient per previous admissions has denied access to firearms or weapons.  Patient has a history of Cannabis use with UDS pending this date.Per notes, it has been well documented  that patient is able to stay with his aunt/payee in [facility] as well as that patient has a fully furnished apartment with food and locks on the windows although patient has been reluctant to stay there in the past for numerous reasons. Patient contnues to report that he is homeless when he has been assessed in the past.   Ala Dach PA writes on arrival: Stark Aguinaga is a 30 y.o. male with history of schizophrenia, depression, homelessness, who presents to the emergency department via EMS from a gas station, called out for behavioral episode and hallucinations.  EMS reports patient reported that he wanted to be checked out because he does not feel safe.  Patient reported to EMS that he could not see but passed EMS eye exam and was able to walk to the EMS truck without guidance and sign his signature.  Patient does report alcohol use today but denies other drugs.  Patient continues to report that he cannot see, when asked why he reports is because "he cannot get his mind right and see things correct in his mind".  He denies eye pain or any injury.  Continues to report that he cannot see, but is able to reach for things and follow commands when asked in quick succession.  He denies SI or HI but reports he does not feel safe and needs to be checked out.  Patient would not answer orientation questions. Patient is observed to be guarded and just stares at this writer without answering most of the questions associated with the assessment. Information to complete assessment was obtained from admission notes and chart review. Patient does not appear to be  responding to internal stimuli.     Chief Complaint:  Chief Complaint  Patient presents with   Psychiatric Evaluation   Visit Diagnosis: Schizophrenia     CCA Screening, Triage and Referral (STR)  Patient Reported Information How did you hear about [place]? Self  What Is the Reason for Your Visit/Call Today? Ongoing AVH and H/I  How Long Has This Been Causing  You Problems? <Week  What Do You Feel Would Help You the Most Today? -- (UTA)   Have You Recently Had Any Thoughts About Hurting Yourself? No  Are You Planning to Commit Suicide/Harm Yourself At This time? No   Have you Recently Had Thoughts About Hurting Someone [name]? Yes  Are You Planning to Harm Someone at This Time? No  Explanation: No data recorded  Have You Used Any Alcohol or Drugs in the Past 24 Hours? No  How Long Ago Did You Use Drugs or Alcohol? 0000 (Unclear)  What Did You Use and How Much? THC, unknown date of last use or amount - "I don't know"   Do You Currently Have a Therapist/Psychiatrist? No  Name of Therapist/Psychiatrist: ACT Team   Have You Been Recently Discharged From Any Office Practice or Programs? No  Explanation of Discharge From Practice/Program: No data recorded    CCA Screening Triage Referral Assessment Type of Contact: Face-to-Face  Telemedicine Service Delivery:   Is this Initial or Reassessment? Initial Assessment  Date Telepsych consult ordered in [facility]:  05/30/21  Time Telepsych consult ordered in [facility]:  2329  Location of Assessment: [facility]  Provider Location: Other (comment) ([facility])   Collateral Involvement: None at this time   Does Patient Have a Court Appointed Legal Guardian? No data recorded Name and Contact of Legal Guardian: [name], legal guardian.   If Minor and Not Living with Parent(s), Who has Custody? NA  Is CPS involved or ever been involved? Never  Is APS involved or ever been involved? Never   Patient Determined To Be At Risk for Harm To Self or Others Based on Review of Patient Reported Information or Presenting Complaint? No  Method: No Plan  Availability of Means: No access or NA  Intent: Vague intent or NA  Notification Required: No need or identified person  Additional Information for Danger to Others Potential: Active psychosis  Additional Comments for Danger to Others Potential:  History of children playing outside triggering patient.  Are There Guns or Other Weapons in Your Home? No  Types of Guns/Weapons: No data recorded Are These Weapons Safely Secured?                            No data recorded Who Could Verify You Are Able To Have These Secured: No data recorded Do You Have any Outstanding Charges, Pending Court Dates, Parole/Probation? None noted  Contacted To Inform of Risk of Harm To Self or Others: Other: Comment (NA)    Does Patient Present under Involuntary Commitment? No  IVC Papers Initial File Date: 10/22/20   [place] of Residence: [place]   Patient Currently Receiving the Following Services: Not Receiving Services   Determination of Need: Urgent (48 hours)   Options For Referral: Outpatient Therapy     CCA Biopsychosocial Patient Reported Schizophrenia/Schizoaffective Diagnosis in Past: Yes   Strengths: Pt is linked to [employer] for ACT Team.   Mental Health Symptoms Depression:   None   Duration of Depressive symptoms:    Mania:  None   Anxiety:    Worrying; Restlessness   Psychosis:   None   Duration of Psychotic symptoms:    Trauma:   None   Obsessions:   None   Compulsions:   None   Inattention:   N/A   Hyperactivity/Impulsivity:   N/A   Oppositional/Defiant Behaviors:   N/A   Emotional Irregularity:   N/A   Other Mood/Personality Symptoms:   None noted    Mental Status Exam Appearance and self-care  Stature:   Average   Weight:   Average weight   Clothing:   Age-appropriate; Casual   Grooming:   Normal   Cosmetic use:   None   Posture/gait:   Normal   Motor activity:   Not Remarkable   Sensorium  Attention:   Normal   Concentration:   Normal   Orientation:   X5   Recall/memory:   Normal   Affect and Mood  Affect:   Blunted; Flat   Mood:   Euthymic   Relating  Eye contact:   Normal   Facial expression:   Responsive   Attitude toward examiner:    Cooperative   Thought and Language  Speech flow:  Paucity   Thought content:   Appropriate to Mood and Circumstances   Preoccupation:   None   Hallucinations:   None   Organization:  No data recorded  Affiliated Computer ServicesExecutive Functions  Fund of Knowledge:   Poor   Intelligence:   Needs investigation   Abstraction:   Concrete   Judgement:   Poor   Reality Testing:   Adequate   Insight:   Poor   Decision Making:   Impulsive   Social Functioning  Social Maturity:   Impulsive   Social Judgement:   Heedless   Stress  Stressors:   Housing; Office managerinancial   Coping Ability:   Normal   Skill Deficits:   Communication; Decision making; Self-care; Self-control   Supports:   Support needed     Religion: Religion/Spirituality Are You A Religious Person?:  (Not assessed) How Might This Affect Treatment?: Not assessed  Leisure/Recreation: Leisure / Recreation Do You Have Hobbies?: Yes Leisure and Hobbies: Football  Exercise/Diet: Exercise/Diet Do You Exercise?: No Have You Gained or Lost A Significant Amount of Weight in the Past Six Months?: No Do You Follow a Special Diet?: No Do You Have Any Trouble Sleeping?: No Explanation of Sleeping Difficulties: Pt denies   CCA Employment/Education Employment/Work Situation: Employment / Work Situation Employment Situation: On disability Why is Patient on Disability: Mental health How Long has Patient Been on Disability: UTA Patient's Job has Been Impacted by Current Illness:  (N/A) Has Patient ever Been in the [employer]ancorp[employer]?: No  Education: Education Last Grade Completed: 9 Did You Product managerAttend College?: No Did You Have An Individualized Education Program (IIEP): No Did You Have Any Difficulty At School?: No   CCA Family/Childhood History Family and Relationship History: Family history Marital status: Single Does patient have children?: No  Childhood History:  Childhood History By whom was/is the patient raised?:  Other (Comment) (Not assessed) Did patient suffer any verbal/emotional/physical/sexual abuse as a child?:  (Pt states "I don't know") Did patient suffer from severe childhood neglect?: No (Pt states "I don't know") Has patient ever been sexually abused/assaulted/raped as an adolescent or adult?: No (Pt states "I don't know") Was the patient ever a victim of a crime or a disaster?: No (Pt states "I don't know") Witnessed domestic violence?: No (Pt states "I don't know")  Has patient been affected by domestic violence as an adult?: No (Pt states "I don't know")  Child/Adolescent Assessment:     CCA Substance Use Alcohol/Drug Use: Alcohol / Drug Use Pain Medications: See MAR Prescriptions: See MAR Over the Counter: See MAR History of alcohol / drug use?: Yes Longest period of sobriety (when/how long): Unknown Negative Consequences of Use:  (Pt denies) Withdrawal Symptoms:  (Pt denies) Substance #1 Name of Substance 1: Marijuana 1 - Age of First Use: UTA 1 - Amount (size/oz): UTA 1 - Frequency: UTA 1 - Duration: UTA 1 - Last Use / Amount: Several days ago pt admitted to the use of marijuana; today pt denies any use 1 - Method of Aquiring: UTA Substance #2 Name of Substance 2: Alcohol 2 - Age of First Use: Teens 2 - Amount (size/oz): Varied 2 - Frequency: Varied 2 - Duration: Unknown 2 - Last Use / Amount: Earlier tonight pt admitted to the use of EtOH today; pt now denies any use 2 - Method of Aquiring: Unknown 2 - Route of Substance Use: Drinking                     ASAM's:  Six Dimensions of Multidimensional Assessment  Dimension 1:  Acute Intoxication and/or Withdrawal Potential:   Dimension 1:  Description of individual's past and current experiences of substance use and withdrawal: Pt reports that he use to smoke marijuana.  Dimension 2:  Biomedical Conditions and Complications:   Dimension 2:  Description of patient's biomedical conditions and  complications:  Pt did not report medical  Dimension 3:  Emotional, Behavioral, or Cognitive Conditions and Complications:  Dimension 3:  Description of emotional, behavioral, or cognitive conditions and complications: Schizophrenia, Paranoid  Dimension 4:  Readiness to Change:  Dimension 4:  Description of Readiness to Change criteria: Pt reports that he is not taking medicaiton  Dimension 5:  Relapse, Continued use, or Continued Problem Potential:  Dimension 5:  Relapse, continued use, or continued problem potential critiera description: Precontemplation  Dimension 6:  Recovery/Living Environment:  Dimension 6:  Recovery/Iiving environment criteria description: Pt reports that he is homeless  ASAM Severity Score: ASAM's Severity Rating Score: 9  ASAM Recommended Level of Treatment: ASAM Recommended Level of Treatment: Level I Outpatient Treatment   Substance use Disorder (SUD) Substance Use Disorder (SUD)  Checklist Symptoms of Substance Use: Continued use despite having a persistent/recurrent physical/psychological problem caused/exacerbated by use, Continued use despite persistent or recurrent social, interpersonal problems, caused or exacerbated by use  Recommendations for Services/Supports/Treatments: Recommendations for Services/Supports/Treatments Recommendations For Services/Supports/Treatments: ACCTT Engineer, agricultural Treatment)  Discharge Disposition:    DSM5 Diagnoses: Patient Active Problem List   Diagnosis Date Noted   Malingering 05/24/2021   Homelessness 05/24/2021   Alcohol abuse 03/21/2021   Substance-induced disorder (HCC) 01/02/2021   Schizophrenia, paranoid type (HCC) 10/24/2020   Paranoid schizophrenia (HCC) 01/05/2020   Other schizophrenia (HCC) 02/14/2018   Cannabis use disorder, moderate, dependence (HCC) 05/10/2016     Referrals to Alternative Service(s): Referred to Alternative Service(s):   Place:   Date:   Time:    Referred to Alternative Service(s):   Place:    Date:   Time:    Referred to Alternative Service(s):   Place:   Date:   Time:    Referred to Alternative Service(s):   Place:   Date:   Time:     Alfredia Ferguson, LCAS

## 2021-05-30 NOTE — Consult Note (Signed)
Per chart review:  Patient is on long-acting injectable and is due for Invega Trinza injection on 06/01/21. Will notify ACT Team 05/31/21 to verify.  He will remain in emergency department for continuous observation and psychiatry will reassess in morning for stability.

## 2021-05-30 NOTE — ED Provider Notes (Signed)
[facility]-EMERGENCY DEPT Provider Note   CSN: [id number] Arrival date & time: 05/30/21  1328     History Chief Complaint  Patient presents with   Psychiatric Evaluation    [name] is a 30 y.o. male.  [name] is a 30 y.o. male with history of schizophrenia, depression, homelessness, who presents to the emergency department via EMS from a gas station, called out for behavioral episode and hallucinations.  EMS reports patient reported that he wanted to be checked out because he does not feel safe.  Patient reported to EMS that he could not see but passed EMS eye exam and was able to walk to the EMS truck without guidance and sign his signature.  Patient does report alcohol use today but denies other drugs.  Patient continues to report that he cannot see, when asked why he reports is because "he cannot get his mind right and see things correct in his mind".  He denies eye pain or any injury.  Continues to report that he cannot see, but is able to reach for things and follow commands when asked in quick succession.  He denies SI or HI but reports he does not feel safe and needs to be checked out.  The history is provided by the patient and medical records.      Past Medical History:  Diagnosis Date   Depression    Schizophrenia [facility])     Patient Active Problem List   Diagnosis Date Noted   Malingering 05/24/2021   Homelessness 05/24/2021   Alcohol abuse 03/21/2021   Substance-induced disorder (HCC) 01/02/2021   Schizophrenia, paranoid type (HCC) 10/24/2020   Paranoid schizophrenia (HCC) 01/05/2020   Other schizophrenia (HCC) 02/14/2018   Cannabis use disorder, moderate, dependence (HCC) 05/10/2016    Past Surgical History:  Procedure Laterality Date   BACK SURGERY         No family history on file.  Social History   Tobacco Use   Smoking status: Some Days    Types: Cigarettes   Smokeless tobacco: Never  Vaping Use   Vaping Use: Never  used  Substance Use Topics   Alcohol use: No   Drug use: Yes    Types: Marijuana    Comment: rare    Home Medications Prior to Admission medications   Medication Sig Start Date End Date Taking? Authorizing Provider  PRESCRIPTION MEDICATION See admin instructions. Unnamed stabilizing injection- Every 90 days    [provider]  paliperidone (INVEGA) 6 MG 24 hr tablet Take 1 tablet (6 mg total) by mouth daily for 14 days. Patient not taking: No sig reported 01/02/21 02/22/21  Mare Ferrari, PA-C    Allergies    Shellfish allergy and Bee pollen  Review of Systems   Review of Systems  Constitutional:  Negative for chills and fever.  HENT: Negative.    Eyes:  Positive for visual disturbance. Negative for pain.  Respiratory:  Negative for shortness of breath.   Cardiovascular:  Negative for chest pain.  Gastrointestinal:  Negative for abdominal pain and vomiting.  Musculoskeletal:  Negative for myalgias.  Skin:  Negative for color change and rash.  Psychiatric/Behavioral:  Positive for dysphoric mood and hallucinations. Negative for suicidal ideas.   All other systems reviewed and are negative.  Physical Exam Updated Vital Signs BP 129/72   Pulse 71   Temp 97.7 F (36.5 C) (Oral)   Resp 18   SpO2 98%   Physical Exam Vitals and nursing  note reviewed.  Constitutional:      General: He is not in acute distress.    Appearance: Normal appearance. He is well-developed. He is not ill-appearing or diaphoretic.  HENT:     Head: Normocephalic and atraumatic.  Eyes:     General:        Right eye: No discharge.        Left eye: No discharge.     Extraocular Movements: Extraocular movements intact.     Pupils: Pupils are equal, round, and reactive to light.     Comments: Patient reports he cannot see but he is able to track me as I move through the room and he is able to reach out and touch things without difficulty, EOMI is intact, PERRLA.  Conjunctivae normal   Cardiovascular:     Rate and Rhythm: Normal rate and regular rhythm.     Pulses: Normal pulses.     Heart sounds: Normal heart sounds.  Pulmonary:     Effort: Pulmonary effort is normal. No respiratory distress.     Breath sounds: Normal breath sounds. No wheezing or rales.     Comments: Respirations equal and unlabored, patient able to speak in full sentences, lungs clear to auscultation bilaterally  Abdominal:     General: Bowel sounds are normal. There is no distension.     Palpations: Abdomen is soft. There is no mass.     Tenderness: There is no abdominal tenderness. There is no guarding.     Comments: Abdomen soft, nondistended, nontender to palpation in all quadrants without guarding or peritoneal signs  Musculoskeletal:        General: No deformity.     Cervical back: Neck supple.  Skin:    General: Skin is warm and dry.     Capillary Refill: Capillary refill takes less than 2 seconds.  Neurological:     Mental Status: He is alert and oriented to person, place, and time.     Coordination: Coordination normal.     Comments: Speech is clear, able to follow commands Moves extremities without ataxia, coordination intact  Psychiatric:        Attention and Perception: He perceives visual hallucinations. He does not perceive auditory hallucinations.        Mood and Affect: Mood is anxious. Affect is blunt.        Speech: Speech normal.        Behavior: Behavior normal. Behavior is cooperative.        Thought Content: Thought content is delusional. Thought content does not include homicidal or suicidal ideation.    ED Results / Procedures / Treatments   Labs (all labs ordered are listed, but only abnormal results are displayed) Labs Reviewed  COMPREHENSIVE METABOLIC PANEL - Abnormal; Notable for the following components:      Result Value   Creatinine, Ser 1.29 (*)    Alkaline Phosphatase 36 (*)    All other components within normal limits  CBC WITH DIFFERENTIAL/PLATELET -  Abnormal; Notable for the following components:   Neutro Abs 1.6 (*)    All other components within normal limits  RESP PANEL BY RT-PCR (FLU A&B, COVID) ARPGX2  ETHANOL  RAPID URINE DRUG SCREEN, HOSP PERFORMED    EKG None  Radiology No results found.  Procedures Procedures   Medications Ordered in ED Medications - No data to display  ED Course  I have reviewed the triage vital signs and the nursing notes.  Pertinent labs & imaging results that  were available during my care of the patient were reviewed by me and considered in my medical decision making (see chart for details).    MDM Rules/Calculators/A&P                           Presents from gas station reporting he does not feel safe, denies SI or HI but does report some hallucinations.  Patient repeatedly stating that he cannot see, but he passed vision screening with EMS and was able to provide signatures with EMS and registration without difficulty.  Is able to reach out and touch things and follow commands and is tracking me as I move throughout the room.  Suspect these reported vision changes are more so related to patient's psychiatric illness as it seems that when he is distracted he is able to see normally without issue.  Could also be related to delusion.  TTS consult placed to medical screening labs ordered.  Medical screening labs are overall unremarkable, no leukocytosis, creatinine at baseline and no other electrolyte derangements, negative ethanol level, COVID screening negative.  Patient is medically cleared for TTS evaluation.  Disposition pending TTS recommendations.  The patient has been placed in psychiatric observation due to the need to provide a safe environment for the patient while obtaining psychiatric consultation and evaluation, as well as ongoing medical and medication management to treat the patient's condition.  The patient has not been placed under full IVC at this time.    Final Clinical  Impression(s) / ED Diagnoses Final diagnoses:  Hallucinations  Visual complaint    Rx / DC Orders ED Discharge Orders     None        Legrand Rams 05/30/21 1701    Lorre Nick, MD 05/31/21 0900

## 2021-05-30 NOTE — ED Notes (Addendum)
Patient dressed out, belongings taken and placed in cabinets 16-18 with patient label.

## 2021-05-31 ENCOUNTER — Emergency Department (HOSPITAL_COMMUNITY)
Admission: EM | Admit: 2021-05-31 | Discharge: 2021-06-01 | Disposition: A | Discharge status: Home / Self Care | Payer: Medicaid - Out of State | Source: Home / Self Care | Attending: Emergency Medicine | Admitting: Emergency Medicine

## 2021-05-31 ENCOUNTER — Other Ambulatory Visit: Payer: Self-pay

## 2021-05-31 DIAGNOSIS — F17210 Nicotine dependence, cigarettes, uncomplicated: Secondary | ICD-10-CM | POA: Insufficient documentation

## 2021-05-31 DIAGNOSIS — R443 Hallucinations, unspecified: Secondary | ICD-10-CM | POA: Insufficient documentation

## 2021-05-31 DIAGNOSIS — Z79899 Other long term (current) drug therapy: Secondary | ICD-10-CM | POA: Insufficient documentation

## 2021-05-31 NOTE — Discharge Instructions (Addendum)
For your behavioral health needs you are advised to continue treatment with the [facility] ACT Team:       [facility]      [address], [address]      [place], [place] [address]      Office:                      [phone]      24 hour crisis line: [phone]  Suicide prevention:  Call 911 Call suicide crisis hotline [phone] Call [facility] [phone] Return to nearest emergency department

## 2021-05-31 NOTE — ED Triage Notes (Signed)
Pt brought in by EMS for hallucinations and anxiety.

## 2021-05-31 NOTE — ED Provider Notes (Addendum)
Emergency Medicine Provider Triage Evaluation Note  [name] , a 30 y.o. male  was evaluated in triage.  Pt complains of ongoing hallucinations.  He was seen and discharged earlier today, he was supposed to go to [name] to get an injection of his long-acting medications.  Patient states he did not go.   Review of Systems  Positive: Hallucinations, anxiety Negative: Vomiting  Physical Exam  BP 134/85 (BP Location: Left Arm)   Pulse 72   Temp 98.9 F (37.2 C) (Oral)   Resp 18   Ht 5\' 5"  (1.651 m)   Wt 74 kg   SpO2 99%   BMI 27.15 kg/m  Gen:   Awake, no distress   Resp:  Normal effort  MSK:   Moves extremities without difficulty  Other:  Speech is not slurred.  Calm and cooperative at this time.  Medical Decision Making  Medically screening exam initiated at 8:46 PM.  Appropriate orders placed.  [name] was informed that the remainder of the evaluation will be completed by another provider, this initial triage assessment does not replace that evaluation, and the importance of remaining in the ED until their evaluation is complete.  Given that patient had labs obtained yesterday we will not obtain additional labs.  Note: Portions of this report may have been transcribed using voice recognition software. Every effort was made to ensure accuracy; however, inadvertent computerized transcription errors may be present    , PA-C 05/31/21 2047    2048, PA-C 05/31/21 2059    2060, MD 06/01/21 [phone]

## 2021-05-31 NOTE — ED Notes (Signed)
Pt calm, cooperative, and alert.  Pt given snack per request.

## 2021-05-31 NOTE — BH Assessment (Addendum)
[facility] Assessment Progress Note   Per Dorena Bodo, NP, this voluntary pt does not require psychiatric hospitalization at this time.  Pt is psychiatrically cleared.  Discharge instructions advise pt to continue treatment with the [facility] Team. At [facility] request, this writer called Vesta Mixer at [employer] and spoke to [place].  She does not know when pt may be due for next injectable medication, but agrees to contact his nurse and have her call me with this information.  She reports that she will not be able to pick him up, advising me instead to send him home by bus where the will contact him to make arrangements for pt to receive injectable if he is due.  EDP Lynden Oxford, MD and pt's nurse, Chrissie Noa, have been notified.  Doylene Canning, MA Triage Specialist [phone]   Addendum:  At 10:40 [facility] calls back to report that pt is due for injectable today.  They advise to send pt directly back home where they will meet him to administer.  Parties noted above have been notified, and Chrissie Noa agrees to instruct pt accordingly.  Doylene Canning, [place] Behavioral Health Coordinator [phone]

## 2021-05-31 NOTE — Consult Note (Signed)
[facility] Psych ED Discharge  05/31/2021 10:24 AM [name]  MRN:  [id number]  Method of visit?: Face to Face   Principal Problem: Schizophrenia, paranoid type [facility]) Discharge Diagnoses: Principal Problem:   Schizophrenia, paranoid type (HCC)   Subjective: per admit note:  [name] is a 30 y.o. male with history of schizophrenia, depression, homelessness, who presents to the emergency department via EMS from a gas station, called out for behavioral episode and hallucinations.  EMS reports patient reported that he wanted to be checked out because he does not feel safe.  Patient reported to EMS that he could not see but passed EMS eye exam and was able to walk to the EMS truck without guidance and sign his signature.  Patient does report alcohol use today but denies other drugs.  Patient continues to report that he cannot see, when asked why he reports is because "he cannot get his mind right and see things correct in his mind".  He denies eye pain or any injury.  Continues to report that he cannot see, but is able to reach for things and follow commands when asked in quick succession.  He denies SI or HI but reports he does not feel safe and needs to be checked out.  Today, patient is seen face to face at [facility] ED. When asked why he is here, he reports, "I don't know" Reports feeling better today and asking to go home.  Denies suicidal ideation, no intent, no plan, denies homicidal ideation, denies auditory and visual hallucinations.  Able to contract for safety. Denies substance and alcohol use. UDS negative. Describes himself as sad about life. Does not appear to be responding to internal or external stimuli.   Sleep is fair, appetite normal. He has ACT Team. Homeless.     Total Time spent with patient: 15 minutes  Past Psychiatric History:   Past Medical History:  Past Medical History:  Diagnosis Date   Depression    Schizophrenia (HCC)     Past Surgical History:  Procedure Laterality  Date   BACK SURGERY     Family History: No family history on file. Family Psychiatric  History:  Social History:  Social History   Substance and Sexual Activity  Alcohol Use No     Social History   Substance and Sexual Activity  Drug Use Yes   Types: Marijuana   Comment: rare    Social History   Socioeconomic History   Marital status: Single    Spouse name: Not on file   Number of children: Not on file   Years of education: Not on file   Highest education level: Not on file  Occupational History   Occupation: unemployed  Tobacco Use   Smoking status: Some Days    Types: Cigarettes   Smokeless tobacco: Never  Vaping Use   Vaping Use: Never used  Substance and Sexual Activity   Alcohol use: No   Drug use: Yes    Types: Marijuana    Comment: rare   Sexual activity: Yes    Birth control/protection: Condom  Other Topics Concern   Not on file  Social History Narrative   ** Merged History Encounter **       ** Merged History Encounter **       UTA certain topics - tangential thought process, cannot answer clearly. Pt not sure about school; reportedly is homeless.    Social Determinants of Health   Financial Resource Strain: Not on file  Food  Insecurity: Not on file  Transportation Needs: Not on file  Physical Activity: Not on file  Stress: Not on file  Social Connections: Not on file    Tobacco Cessation:  N/A, patient does not currently use tobacco products  Current Medications: No current facility-administered medications for this encounter.   Current Outpatient Medications  Medication Sig Dispense Refill   Paliperidone Palmitate (INVEGA TRINZA IM) Inject into the muscle every 3 (three) months.     PTA Medications: (Not in a hospital admission)   Musculoskeletal: Strength & Muscle Tone: within normal limits Gait & Station: normal Patient leans: N/A  Psychiatric Specialty Exam:  Presentation  General Appearance: Appropriate for  Environment  Eye Contact:Good  Speech:Clear and Coherent; Normal Rate  Speech Volume:Normal  Handedness:Right   Mood and Affect  Mood:Euthymic  Affect:Congruent   Thought Process  Thought Processes:Coherent; Goal Directed  Descriptions of Associations:Intact  Orientation:Full (Time, Place and Person)  Thought Content:Logical  History of Schizophrenia/Schizoaffective disorder:Yes  Duration of Psychotic Symptoms:Greater than six months  Hallucinations:Hallucinations: None Description of Auditory Hallucinations: denies Ideas of Reference:None  Suicidal Thoughts:Suicidal Thoughts: No Homicidal Thoughts:Homicidal Thoughts: No  Sensorium  Memory:Immediate Good; Recent Good; Remote Good  Judgment:Intact  Insight:Present   Executive Functions  Concentration:Good  Attention Span:Good  Recall:Good  Fund of Knowledge:Good  Language:Good   Psychomotor Activity  Psychomotor Activity: Psychomotor Activity: Normal  Assets  Assets:Communication Skills; Desire for Improvement; Leisure Time; Physical Health; Resilience; Social Support   Sleep  Sleep: Sleep: Good   Physical Exam: Physical Exam Vitals reviewed.  Cardiovascular:     Rate and Rhythm: Normal rate.  Pulmonary:     Effort: Pulmonary effort is normal.  Skin:    General: Skin is warm and dry.  Neurological:     Mental Status: He is alert and oriented to person, place, and time.  Psychiatric:        Attention and Perception: Attention normal.        Mood and Affect: Mood normal.        Speech: Speech normal.        Behavior: Behavior normal. Behavior is cooperative.        Thought Content: Thought content is not paranoid or delusional. Thought content does not include homicidal or suicidal ideation. Thought content does not include homicidal or suicidal plan.   Review of Systems  Constitutional:  Negative for chills and fever.  Respiratory:  Negative for shortness of breath.    Cardiovascular:  Negative for chest pain.  Gastrointestinal:  Negative for abdominal pain.  Neurological:  Negative for headaches.  Psychiatric/Behavioral:  Negative for depression, hallucinations, substance abuse and suicidal ideas. The patient is not nervous/anxious and does not have insomnia.   Blood pressure 125/87, pulse 87, temperature 97.7 F (36.5 C), temperature source Oral, resp. rate 18, SpO2 100 %. There is no height or weight on file to calculate BMI.   Demographic Factors:  Male, Adolescent or young adult, Low socioeconomic status, and Living alone  Loss Factors: NA  Historical Factors: NA  Risk Reduction Factors:   NA  Continued Clinical Symptoms:  Schizophrenia:   Paranoid or undifferentiated type  Cognitive Features That Contribute To Risk:  None    Suicide Risk:  Mild:  Suicidal ideation of limited frequency, intensity, duration, and specificity.  There are no identifiable plans, no associated intent, mild dysphoria and related symptoms, good self-control (both objective and subjective assessment), few other risk factors, and identifiable protective factors, including available and accessible social support.  Plan Of Care/Follow-up recommendations:  Other:  Safe for discharge with resources. Patient has ACT Team and on long-acting injectable. Behavioral health coordinator notified ACT Team to arrange for next injection (see note by T. Kizzie Bane for details).  Safety plan reviewed in detail.   Disposition: psychiatrically cleared with resources in place with ACT Team. Patient is due for his LAI and plans are made with ACT Team provider to administer soon.   Novella Olive, NP 05/31/2021, 10:24 AM

## 2021-05-31 NOTE — ED Provider Notes (Signed)
Emergency Medicine Observation Re-evaluation Note  [name] is a 30 y.o. male, seen on rounds today.  Pt initially presented to the ED for complaints of Psychiatric Evaluation Currently, the patient is awaiting psychiatric recommendations.  Physical Exam  BP 125/87 (BP Location: Left Arm)   Pulse 87   Temp 97.7 F (36.5 C) (Oral)   Resp 18   SpO2 100%  Physical Exam General: Resting comfortably Cardiac: No murmur Lungs: Clear breath sounds Psych: No complaints  ED Course / MDM  EKG:   I have reviewed the labs performed to date as well as medications administered while in observation.  Recent changes in the last 24 hours include none.  Plan  Current plan is for psychiatry saw him this morning and feel he is now safe discharge home.  We will get him discharged.   [name] is not under involuntary commitment.   Clinical Impression: 1. Hallucinations   2. Visual complaint     Disposition: Discharge  Condition: Good  I have discussed the results, Dx and Tx plan with the pt(& family if present). He/she/they expressed understanding and agree(s) with the plan. Discharge instructions discussed at great length. Strict return precautions discussed and pt &/or family have verbalized understanding of the instructions. No further questions at time of discharge.    New Prescriptions   No medications on file    Follow Up: follow up with mental health team         [name], Adrian Brim, MD 05/31/21 1029

## 2021-06-01 ENCOUNTER — Ambulatory Visit (HOSPITAL_COMMUNITY)
Admission: EM | Admit: 2021-06-01 | Discharge: 2021-06-01 | Disposition: A | Discharge status: Home / Self Care | Payer: Medicaid Other | Attending: Registered Nurse | Admitting: Registered Nurse

## 2021-06-01 DIAGNOSIS — Z5900 Homelessness unspecified: Secondary | ICD-10-CM

## 2021-06-01 DIAGNOSIS — F1999 Other psychoactive substance use, unspecified with unspecified psychoactive substance-induced disorder: Secondary | ICD-10-CM | POA: Diagnosis present

## 2021-06-01 DIAGNOSIS — F200 Paranoid schizophrenia: Secondary | ICD-10-CM | POA: Diagnosis present

## 2021-06-01 DIAGNOSIS — Z765 Malingerer [conscious simulation]: Secondary | ICD-10-CM | POA: Insufficient documentation

## 2021-06-01 DIAGNOSIS — F1910 Other psychoactive substance abuse, uncomplicated: Secondary | ICD-10-CM | POA: Insufficient documentation

## 2021-06-01 DIAGNOSIS — F1220 Cannabis dependence, uncomplicated: Secondary | ICD-10-CM | POA: Diagnosis present

## 2021-06-01 NOTE — Discharge Instructions (Addendum)
It is very important that you follow-up with [facility] to get your medication.

## 2021-06-01 NOTE — ED Provider Notes (Signed)
[name] EMERGENCY DEPARTMENT Provider Note   CSN: [id number] Arrival date & time: 05/31/21  2027     History Chief Complaint  Patient presents with   Hallucinations    [name] is a 30 y.o. male.  HPI     This is a 30 year old male with a history of depression and schizophrenia who presents with initial concerns for hallucinations.  Patient was just discharged from the hospital yesterday.  He did not follow-up with [facility] to get his schizophrenic medications.  On my evaluation, he cannot tell me why he was here.  However, on triage he reported hallucinations.  He denies these to me.  He denies delusions.  He denies SI or HI.  He has no complaints at this time.  He does confirm that he did not get his medication at [facility] yesterday.  Past Medical History:  Diagnosis Date   Depression    Schizophrenia [facility])     Patient Active Problem List   Diagnosis Date Noted   Malingering 05/24/2021   Homelessness 05/24/2021   Alcohol abuse 03/21/2021   Substance-induced disorder (HCC) 01/02/2021   Schizophrenia, paranoid type (HCC) 10/24/2020   Paranoid schizophrenia (HCC) 01/05/2020   Other schizophrenia (HCC) 02/14/2018   Cannabis use disorder, moderate, dependence (HCC) 05/10/2016    Past Surgical History:  Procedure Laterality Date   BACK SURGERY         No family history on file.  Social History   Tobacco Use   Smoking status: Some Days    Types: Cigarettes   Smokeless tobacco: Never  Vaping Use   Vaping Use: Never used  Substance Use Topics   Alcohol use: No   Drug use: Yes    Types: Marijuana    Comment: rare    Home Medications Prior to Admission medications   Medication Sig Start Date End Date Taking? Authorizing Provider  Paliperidone Palmitate (INVEGA TRINZA IM) Inject into the muscle every 3 (three) months.    [provider]  paliperidone (INVEGA) 6 MG 24 hr tablet Take 1 tablet (6 mg total) by mouth daily for 14  days. Patient not taking: No sig reported 01/02/21 02/22/21  Mare Ferrari, PA-C    Allergies    Shellfish allergy and Bee pollen  Review of Systems   Review of Systems  Constitutional:  Negative for fever.  Psychiatric/Behavioral:  Negative for agitation, confusion, hallucinations and suicidal ideas.   All other systems reviewed and are negative.  Physical Exam Updated Vital Signs BP 134/85 (BP Location: Left Arm)   Pulse 72   Temp 98.9 F (37.2 C) (Oral)   Resp 18   Ht 1.651 m (5\' 5" )   Wt 74 kg   SpO2 99%   BMI 27.15 kg/m   Physical Exam Vitals and nursing note reviewed.  Constitutional:      Appearance: He is well-developed.     Comments: Disheveled appearing but nontoxic  HENT:     Head: Normocephalic and atraumatic.  Eyes:     Pupils: Pupils are equal, round, and reactive to light.  Cardiovascular:     Rate and Rhythm: Normal rate and regular rhythm.  Pulmonary:     Effort: Pulmonary effort is normal. No respiratory distress.  Abdominal:     Palpations: Abdomen is soft.  Musculoskeletal:     Cervical back: Neck supple.  Lymphadenopathy:     Cervical: No cervical adenopathy.  Skin:    General: Skin is warm and dry.  Neurological:  Mental Status: He is alert and oriented to person, place, and time.  Psychiatric:     Comments: Bizarre affect    ED Results / Procedures / Treatments   Labs (all labs ordered are listed, but only abnormal results are displayed) Labs Reviewed - No data to display  EKG None  Radiology No results found.  Procedures Procedures   Medications Ordered in ED Medications - No data to display  ED Course  I have reviewed the triage vital signs and the nursing notes.  Pertinent labs & imaging results that were available during my care of the patient were reviewed by me and considered in my medical decision making (see chart for details).    MDM Rules/Calculators/A&P                           Patient presents and  is currently without complaint.  Initially reported hallucinations.  Was cleared by psychiatry yesterday for outpatient follow-up.  I have encouraged him to follow-up with [facility] as an outpatient.  Does not warrant any emergent evaluation today.  Vital signs reviewed and reassuring.  After history, exam, and medical workup I feel the patient has been appropriately medically screened and is safe for discharge home. Pertinent diagnoses were discussed with the patient. Patient was given return precautions.  Final Clinical Impression(s) / ED Diagnoses Final diagnoses:  Hallucinations    Rx / DC Orders ED Discharge Orders     None        Nalaya Wojdyla, Mayer Masker, MD 06/01/21 [phone]

## 2021-06-01 NOTE — ED Notes (Signed)
Pt provided belongings and ambulated to restroom to change and leave

## 2021-06-01 NOTE — Discharge Summary (Signed)
[name] to be D/C'd Home per NP order. Discussed with the patient and all questions fully answered. An After Visit Summary was printed and given to the patient. Patient escorted out and D/C home via [employer].  [name]  06/01/2021 6:05 PM

## 2021-06-01 NOTE — ED Provider Notes (Addendum)
Behavioral Health Urgent Care Medical Screening Exam  Patient Name: [name] MRN: [id number] Date of Evaluation: 06/01/21 Chief Complaint:   Diagnosis:  Final diagnoses:  Malingering  Substance-induced disorder (HCC)  Homelessness  Cannabis use disorder, moderate, dependence (HCC)  Schizophrenia, paranoid type (HCC)    History of Present illness: [name] is a 30 y.o. male patient presented to [facility] as a walk in with complaint "I'm sick" but responded "I don't know" when asked what was making sick or what was going on.   [name], 30 y.o., male patient seen face to face by this provider, consulted with Dr. Earlene Plater; and chart reviewed on 06/01/21.  On evaluation [name] reports he came to urgent care because he was sick but doesn't know what he is sick from.  Patient grinning why saying "I don't know."  Patient informed that he was seen by this provider a couple of weeks ago and that he had to know something because he called the police to bring him here; patient laughed and said "I don't know."  Patient denies suicidal/homicidal ideation, psychosis, and paranoia.  Patient stated that he has been drinking and doesn't have a place to stay.  Informed that he is not allowed to stay just because he has no where to stay.  Asked when he last saw ACTT he stated "I don't know"  Asked what he had been drinking "12 pack beer"  Asked if he had done any drugs "I don't know."    Collateral Information:  Event organiser states that patient told someone at [employer] to call the police because he wanted to hurt someone.    During evaluation [name] is sitting in chair in no acute distress.  He is dressed appropriated for weather.  He is alert/oriented x 3; calm/cooperative; and mood congruent with affect.  He is speaking in a clear tone at moderate volume, and normal pace; with good eye contact.  He doesn't appear to be responding to internal/external stimuli or  delusional thinking.  He denies suicidal/self-harm/homicidal ideation, psychosis, and paranoia.  This is baseline behavior with this patient. This Clinical research associate has spoken to [employer] on a prior visit who reported this is patients baseline and an act put on by patient.  Patient is well known to ED/urgent care and has had multiple visits with same or similar presentation.  Patient denies suicidal/self-harm/homicidal ideation, psychosis, and paranoia.  Patient is not an imminent danger to himself or others and is only request a place to stay since his homeless.  Patient receives Eyvonne Mechanic (Q 56-month LAI) which was due today.  Patient how ever responded "I don't know" when asked if he saw his ACTT and received injection.   Patient encouraged to follow up with ACTT.     Psychiatric Specialty Exam  Presentation  General Appearance:Appropriate for Environment  Eye Contact:Good  Speech:Clear and Coherent; Normal Rate  Speech Volume:Normal  Handedness:Right   Mood and Affect  Mood:Euthymic  Affect:Congruent   Thought Process  Thought Processes:Coherent  Descriptions of Associations:Intact  Orientation:Full (Time, Place and Person)  Thought Content:WDL  Diagnosis of Schizophrenia or Schizoaffective disorder in past: No  Duration of Psychotic Symptoms: Greater than six months  Hallucinations:Other (comment) (Patient states "I don't know") denies  Ideas of Reference:None  Suicidal Thoughts:No  Homicidal Thoughts:No   Sensorium  Memory:Other (comment) (Unable to assess related to patient stating "I don't know")  Judgment:Intact  Insight:Other (comment) ("I don't know")   Executive  Functions  Concentration:Good  Attention Span:Good  Recall:Good  Fund of Knowledge:Good  Language:Good   Psychomotor Activity  Psychomotor Activity:Normal   Assets  Assets:Desire for Improvement; Physical Health; Resilience   Sleep  Sleep:Good  Number of  hours: 0 (Patient did not provide details regarding number of hours of sleep.)   Nutritional Assessment (For OBS and FBC admissions only) Has the patient had a weight loss or gain of 10 pounds or more in the last 3 months?: No Has the patient had a decrease in food intake/or appetite?: No Does the patient have dental problems?: No Does the patient have eating habits or behaviors that may be indicators of an eating disorder including binging or inducing vomiting?: No Has the patient recently lost weight without trying?: No Has the patient been eating poorly because of a decreased appetite?: No Malnutrition Screening Tool Score: 0   Physical Exam: Physical Exam Vitals and nursing note reviewed. Exam conducted with a chaperone present.  Constitutional:      General: He is not in acute distress.    Appearance: Normal appearance. He is not ill-appearing.  Cardiovascular:     Rate and Rhythm: Normal rate.  Pulmonary:     Effort: Pulmonary effort is normal.  Musculoskeletal:        General: Normal range of motion.     Cervical back: Normal range of motion.  Skin:    General: Skin is warm and dry.  Neurological:     Mental Status: He is alert and oriented to person, place, and time.  Psychiatric:        Attention and Perception: Attention and perception normal. He does not perceive auditory or visual hallucinations.        Mood and Affect: Mood and affect normal.        Speech: Speech normal. Noncommunicative: Stating "I don't know"  to multiple questions he didn't want to answer.        Behavior: Behavior normal.        Thought Content: Thought content is not paranoid or delusional. Thought content does not include homicidal or suicidal ideation.        Cognition and Memory: Cognition and memory normal.        Judgment: Judgment normal.   Review of Systems  Unable to perform ROS: Other (Patient responding "I don't know" to most questions.)  Psychiatric/Behavioral:  Positive for  substance abuse. Depression: Stable. Hallucinations: Denies. Suicidal ideas: Denies.The patient is not nervous/anxious. Insomnia: Denies.       "I been drinking beer.  I need a place to stay."  Blood pressure 117/79, pulse 66, temperature 98.5 F (36.9 C), temperature source Oral, resp. rate 16, SpO2 99 %. There is no height or weight on file to calculate BMI.  Musculoskeletal: Strength & Muscle Tone: within normal limits Gait & Station: normal Patient leans: N/A   [facility] MSE Discharge Disposition for Follow up and Recommendations: Based on my evaluation the patient does not appear to have an emergency medical condition and can be discharged with resources and follow up care in outpatient services for Medication Management, Individual Therapy, Group Therapy, and Follow up with [name]   Follow-up Information     [facility].   Why: Follow with [facility] information: [address]  [address] [place] [address] [phone]                 [name], NP 06/01/2021, 5:50 PM

## 2021-06-02 ENCOUNTER — Other Ambulatory Visit: Payer: Self-pay

## 2021-06-02 ENCOUNTER — Encounter (HOSPITAL_COMMUNITY): Payer: Self-pay

## 2021-06-02 ENCOUNTER — Emergency Department (HOSPITAL_COMMUNITY)
Admission: EM | Admit: 2021-06-02 | Discharge: 2021-06-02 | Disposition: A | Discharge status: Home / Self Care | Payer: Medicaid Other | Attending: Emergency Medicine | Admitting: Emergency Medicine

## 2021-06-02 DIAGNOSIS — F200 Paranoid schizophrenia: Secondary | ICD-10-CM | POA: Insufficient documentation

## 2021-06-02 DIAGNOSIS — F17210 Nicotine dependence, cigarettes, uncomplicated: Secondary | ICD-10-CM | POA: Diagnosis not present

## 2021-06-02 DIAGNOSIS — F209 Schizophrenia, unspecified: Secondary | ICD-10-CM

## 2021-06-02 DIAGNOSIS — R443 Hallucinations, unspecified: Secondary | ICD-10-CM | POA: Diagnosis present

## 2021-06-02 NOTE — ED Triage Notes (Signed)
Pt arrives with [employer] with complaint of visual hallucinations and needing some medicine.

## 2021-06-02 NOTE — ED Provider Notes (Signed)
[facility]-EMERGENCY DEPT Provider Note   CSN: [id number] Arrival date & time: 06/02/21  1943     History Chief Complaint  Patient presents with   Hallucinations    [name] is a 30 y.o. male.  Patient with history of schizophrenia, presents to the emergency department today after being seen in the emergency department yesterday and by behavioral health urgent care.  Patient presents with complaints of "seeing things".  This is consistent with previous visits.  When asked about other specific medical problems he states that he feels generally "sick with life", but denies any acute complaints.  He states "I do not know, I do not know" to most of my questioning.  Review of previous records show the patient was supposed to follow-up with [facility] and his ACT team for his antipsychotic medication.  Level 5 caveat due to psychiatric illness.      Past Medical History:  Diagnosis Date   Depression    Schizophrenia [facility])     Patient Active Problem List   Diagnosis Date Noted   Malingering 05/24/2021   Homelessness 05/24/2021   Alcohol abuse 03/21/2021   Substance-induced disorder (HCC) 01/02/2021   Schizophrenia, paranoid type (HCC) 10/24/2020   Paranoid schizophrenia (HCC) 01/05/2020   Other schizophrenia (HCC) 02/14/2018   Cannabis use disorder, moderate, dependence (HCC) 05/10/2016    Past Surgical History:  Procedure Laterality Date   BACK SURGERY         No family history on file.  Social History   Tobacco Use   Smoking status: Some Days    Types: Cigarettes   Smokeless tobacco: Never  Vaping Use   Vaping Use: Never used  Substance Use Topics   Alcohol use: No   Drug use: Yes    Types: Marijuana    Comment: rare    Home Medications Prior to Admission medications   Medication Sig Start Date End Date Taking? Authorizing Provider  Paliperidone Palmitate (INVEGA TRINZA IM) Inject into the muscle every 3 (three) months.    [provider]  paliperidone (INVEGA) 6 MG 24 hr tablet Take 1 tablet (6 mg total) by mouth daily for 14 days. Patient not taking: No sig reported 01/02/21 02/22/21  Mare Ferrari, PA-C    Allergies    Shellfish allergy and Bee pollen  Review of Systems   Review of Systems  Unable to perform ROS: Psychiatric disorder   Physical Exam Updated Vital Signs BP 132/68 (BP Location: Left Arm)   Pulse (!) 56   Temp 98.2 F (36.8 C) (Oral)   Resp 15   Ht 5\' 5"  (1.651 m)   Wt 73 kg   SpO2 100%   BMI 26.79 kg/m   Physical Exam Vitals and nursing note reviewed.  Constitutional:      General: He is not in acute distress.    Appearance: He is well-developed.  HENT:     Head: Normocephalic and atraumatic.  Eyes:     General:        Right eye: No discharge.        Left eye: No discharge.     Conjunctiva/sclera: Conjunctivae normal.  Cardiovascular:     Rate and Rhythm: Normal rate and regular rhythm.     Heart sounds: Normal heart sounds.  Pulmonary:     Effort: Pulmonary effort is normal.     Breath sounds: Normal breath sounds.  Abdominal:     Palpations: Abdomen is soft.     Tenderness:  There is no abdominal tenderness.  Musculoskeletal:     Cervical back: Normal range of motion and neck supple.  Skin:    General: Skin is warm and dry.  Neurological:     Mental Status: He is alert.  Psychiatric:        Attention and Perception: Attention normal.        Mood and Affect: Affect is blunt.        Speech: Speech normal.    ED Results / Procedures / Treatments   Labs (all labs ordered are listed, but only abnormal results are displayed) Labs Reviewed - No data to display  EKG None  Radiology No results found.  Procedures Procedures   Medications Ordered in ED Medications - No data to display  ED Course  I have reviewed the triage vital signs and the nursing notes.  Pertinent labs & imaging results that were available during my care of the patient were  reviewed by me and considered in my medical decision making (see chart for details).  Patient seen and examined.  He is sitting comfortably in exam chair, watching TV, when I enter the room.  He is calm and cooperative but cannot tell me why he is here other than his hallucinations.  Today is the 21st ED/behavioral health visit he has had since the beginning of August.  Yesterday at behavioral health urgent care, he was discharged and encouraged to follow-up with his ACT team for [facility] shot.   Vital signs reviewed and are as follows: BP 132/68 (BP Location: Left Arm)   Pulse (!) 56   Temp 98.2 F (36.8 C) (Oral)   Resp 15   Ht 5\' 5"  (1.651 m)   Wt 73 kg   SpO2 100%   BMI 26.79 kg/m      MDM Rules/Calculators/A&P                           Patient appears at his baseline given previous behavioral health and ED exams.  No obvious acute problems on physical exam.  Do not feel that patient requires emergent psychiatric evaluation tonight given extensive history of same.  Will discharge.  Patient strongly encouraged to follow-up with his community ACT team and [facility].   Final Clinical Impression(s) / ED Diagnoses Final diagnoses:  Schizophrenia, unspecified type [facility])    Rx / DC Orders ED Discharge Orders     None        [facility] 06/02/21 2008    2009, MD 06/05/21 [phone]

## 2021-06-02 NOTE — ED Notes (Signed)
Pt sts he doesn't need discharge papers and is going to leave.

## 2021-06-03 ENCOUNTER — Other Ambulatory Visit: Payer: Self-pay

## 2021-06-03 ENCOUNTER — Encounter (HOSPITAL_COMMUNITY): Payer: Self-pay

## 2021-06-03 ENCOUNTER — Emergency Department (HOSPITAL_COMMUNITY)
Admission: EM | Admit: 2021-06-03 | Discharge: 2021-06-04 | Disposition: A | Discharge status: Home / Self Care | Payer: Medicaid - Out of State | Attending: Emergency Medicine | Admitting: Emergency Medicine

## 2021-06-03 DIAGNOSIS — T730XXA Starvation, initial encounter: Secondary | ICD-10-CM | POA: Insufficient documentation

## 2021-06-03 DIAGNOSIS — F17210 Nicotine dependence, cigarettes, uncomplicated: Secondary | ICD-10-CM | POA: Diagnosis not present

## 2021-06-03 NOTE — ED Triage Notes (Signed)
Pt arrives EMS with complaint with feeling sick. Refuses to elaborate.

## 2021-06-04 NOTE — ED Provider Notes (Signed)
[facility]-EMERGENCY DEPT Provider Note   CSN: [id number] Arrival date & time: 06/03/21  1937     History Chief Complaint  Patient presents with   Nausea    [name] is a 30 y.o. male.  The history is provided by the patient and medical records.   30 year old male with history of depression, schizophrenia, homelessness, presenting to the ED with complaint of "feeling sick".  When asked to elaborate further he states his stomach is hurting because he is hungry.  He has not eaten today but did have some water to drink.  He denies any vomiting or diarrhea.  No fever or chills.  He would like a [place] sandwich.  Past Medical History:  Diagnosis Date   Depression    Schizophrenia [facility])     Patient Active Problem List   Diagnosis Date Noted   Malingering 05/24/2021   Homelessness 05/24/2021   Alcohol abuse 03/21/2021   Substance-induced disorder (HCC) 01/02/2021   Schizophrenia, paranoid type (HCC) 10/24/2020   Paranoid schizophrenia (HCC) 01/05/2020   Other schizophrenia (HCC) 02/14/2018   Cannabis use disorder, moderate, dependence (HCC) 05/10/2016    Past Surgical History:  Procedure Laterality Date   BACK SURGERY         No family history on file.  Social History   Tobacco Use   Smoking status: Some Days    Types: Cigarettes   Smokeless tobacco: Never  Vaping Use   Vaping Use: Never used  Substance Use Topics   Alcohol use: No   Drug use: Yes    Types: Marijuana    Comment: rare    Home Medications Prior to Admission medications   Medication Sig Start Date End Date Taking? Authorizing Provider  Paliperidone Palmitate (INVEGA TRINZA IM) Inject into the muscle every 3 (three) months.    [provider]  paliperidone (INVEGA) 6 MG 24 hr tablet Take 1 tablet (6 mg total) by mouth daily for 14 days. Patient not taking: No sig reported 01/02/21 02/22/21  Mare Ferrari, PA-C    Allergies    Shellfish allergy and Bee  pollen  Review of Systems   Review of Systems  Constitutional:        Feels "sick"  All other systems reviewed and are negative.  Physical Exam Updated Vital Signs BP 111/80   Pulse 64   Temp 98.6 F (37 C) (Oral)   Resp 16   Ht 5\' 5"  (1.651 m)   Wt 72.6 kg   SpO2 98%   BMI 26.63 kg/m   Physical Exam Vitals and nursing note reviewed.  Constitutional:      Appearance: He is well-developed.     Comments: Sleeping in floor, NAD  HENT:     Head: Normocephalic and atraumatic.  Eyes:     Conjunctiva/sclera: Conjunctivae normal.     Pupils: Pupils are equal, round, and reactive to light.  Cardiovascular:     Rate and Rhythm: Normal rate and regular rhythm.     Heart sounds: Normal heart sounds.  Pulmonary:     Effort: Pulmonary effort is normal.     Breath sounds: Normal breath sounds.  Abdominal:     General: Bowel sounds are normal.     Palpations: Abdomen is soft.  Musculoskeletal:        General: Normal range of motion.     Cervical back: Normal range of motion.  Skin:    General: Skin is warm and dry.  Neurological:  Mental Status: He is alert and oriented to person, place, and time.    ED Results / Procedures / Treatments   Labs (all labs ordered are listed, but only abnormal results are displayed) Labs Reviewed - No data to display  EKG None  Radiology No results found.  Procedures Procedures   Medications Ordered in ED Medications - No data to display  ED Course  I have reviewed the triage vital signs and the nursing notes.  Pertinent labs & imaging results that were available during my care of the patient were reviewed by me and considered in my medical decision making (see chart for details).    MDM Rules/Calculators/A&P                           30 year old male presenting to the ED with complaint of "feeling sick".  When questioned further, he reports he is hungry and is asking for [place] sandwich.  He states he has not eaten today  but has been drinking water.  Frequent (almost daily) ED visits recently.  Given give food here.  Stable for discharge otherwise.    Final Clinical Impression(s) / ED Diagnoses Final diagnoses:  Hungry, initial encounter    Rx / DC Orders ED Discharge Orders     None        Garlon Hatchet, PA-C 06/04/21 0438    Gilda Crease, MD 06/05/21 [phone]

## 2021-06-05 ENCOUNTER — Encounter (HOSPITAL_COMMUNITY): Payer: Self-pay | Admitting: Emergency Medicine

## 2021-06-05 ENCOUNTER — Emergency Department (HOSPITAL_COMMUNITY): Payer: Medicaid - Out of State

## 2021-06-05 ENCOUNTER — Emergency Department (HOSPITAL_COMMUNITY)
Admission: EM | Admit: 2021-06-05 | Discharge: 2021-06-06 | Disposition: A | Discharge status: Home / Self Care | Payer: Medicaid - Out of State | Attending: Emergency Medicine | Admitting: Emergency Medicine

## 2021-06-05 ENCOUNTER — Ambulatory Visit (HOSPITAL_COMMUNITY)
Admission: EM | Admit: 2021-06-05 | Discharge: 2021-06-05 | Disposition: A | Discharge status: Home / Self Care | Payer: Medicaid Other | Attending: Psychiatry | Admitting: Psychiatry

## 2021-06-05 ENCOUNTER — Other Ambulatory Visit: Payer: Self-pay

## 2021-06-05 DIAGNOSIS — F17210 Nicotine dependence, cigarettes, uncomplicated: Secondary | ICD-10-CM | POA: Insufficient documentation

## 2021-06-05 DIAGNOSIS — R0602 Shortness of breath: Secondary | ICD-10-CM | POA: Diagnosis not present

## 2021-06-05 DIAGNOSIS — R001 Bradycardia, unspecified: Secondary | ICD-10-CM | POA: Insufficient documentation

## 2021-06-05 DIAGNOSIS — F200 Paranoid schizophrenia: Secondary | ICD-10-CM | POA: Insufficient documentation

## 2021-06-05 LAB — BASIC METABOLIC PANEL
Anion gap: 7 (ref 5–15)
BUN: 13 mg/dL (ref 6–20)
CO2: 27 mmol/L (ref 22–32)
Calcium: 9 mg/dL (ref 8.9–10.3)
Chloride: 104 mmol/L (ref 98–111)
Creatinine, Ser: 1.17 mg/dL (ref 0.61–1.24)
GFR, Estimated: >60 mL/min (ref >60)
Glucose, Bld: 86 mg/dL (ref 70–99)
Potassium: 3.6 mmol/L (ref 3.5–5.1)
Sodium: 138 mmol/L (ref 135–145)

## 2021-06-05 LAB — CBC WITH DIFFERENTIAL/PLATELET
Abs Immature Granulocytes: 0.01 10*3/uL (ref 0.00–0.07)
Basophils Absolute: 0 10*3/uL (ref 0.0–0.1)
Basophils Relative: 0 %
Eosinophils Absolute: 0.4 10*3/uL (ref 0.0–0.5)
Eosinophils Relative: 9 %
HCT: 40.5 % (ref 39.0–52.0)
Hemoglobin: 13.9 g/dL (ref 13.0–17.0)
Immature Granulocytes: 0 %
Lymphocytes Relative: 48 %
Lymphs Abs: 2.2 10*3/uL (ref 0.7–4.0)
MCH: 30.8 pg (ref 26.0–34.0)
MCHC: 34.3 g/dL (ref 30.0–36.0)
MCV: 89.8 fL (ref 80.0–100.0)
Monocytes Absolute: 0.4 10*3/uL (ref 0.1–1.0)
Monocytes Relative: 10 %
Neutro Abs: 1.5 10*3/uL — ABNORMAL LOW (ref 1.7–7.7)
Neutrophils Relative %: 33 %
Platelets: 199 10*3/uL (ref 150–400)
RBC: 4.51 MIL/uL (ref 4.22–5.81)
RDW: 12.4 % (ref 11.5–15.5)
WBC: 4.5 10*3/uL (ref 4.0–10.5)
nRBC: 0 % (ref 0.0–0.2)

## 2021-06-05 LAB — TROPONIN I (HIGH SENSITIVITY): Troponin I (High Sensitivity): 4 ng/L (ref <18)

## 2021-06-05 NOTE — ED Notes (Signed)
Discharge instructions provided and Pt stated understanding. Pt alert, orient and ambulatory prior to d/c from facility. Personal belongings returned from the pink locker. Pt escorted to the front lobby. Safety maintained.

## 2021-06-05 NOTE — Progress Notes (Signed)
Received [name] in the [facility] with [employer], he is alert  and oriented x4. Cooperative with the assessment and denied feeling suicdial or homicidal at the present time. He is not sure if he is hearing voices. He requested [employer] [place], they [place] him here.

## 2021-06-05 NOTE — ED Provider Notes (Signed)
   Behavioral Health Urgent Care Medical Screening Exam  Patient Name: [name] MRN: [id number] Date of Evaluation: 06/05/21 Chief Complaint:   Diagnosis:  Final diagnoses:  Schizophrenia, paranoid (HCC)    History of Present illness: Kensley Lares is a 30 y.o. male present to [facility] accompanied by [facility].  Chart reviewed patient is well-known to this service.  Charted history with schizophrenia paranoid type.  Was reported  that patient endorsed " not feeling safe " patient observed resting in assessment room with his eyes closed  and the lights was off.    NP introduced herself.  Mahesh stated " [place]."  He denied suicidal or homicidal ideations.  Denies auditory or visual hallucinations.  Discussed providing bus pass and something to drink.  Patient was receptive to plan.  We will make additional outpatient resources available.  Support encouragement reassurance was provided.  Psychiatric Specialty Exam  Presentation  General Appearance:Appropriate for Environment  Eye Contact:Good  Speech:Clear and Coherent; Normal Rate  Speech Volume:Normal  Handedness:Right   Mood and Affect  Mood:Euthymic  Affect:Congruent   Thought Process  Thought Processes:Coherent  Descriptions of Associations:Intact  Orientation:Full (Time, Place and Person)  Thought Content:WDL  Diagnosis of Schizophrenia or Schizoaffective disorder in past: No  Duration of Psychotic Symptoms: Greater than six months  Hallucinations:Other (comment) (Patient states "I don't know") denies  Ideas of Reference:None  Suicidal Thoughts:No  Homicidal Thoughts:No   Sensorium  Memory:Other (comment) (Unable to assess related to patient stating "I don't know")  Judgment:Intact  Insight:Other (comment) ("I don't know")   Executive Functions  Concentration:Good  Attention Span:Good  Recall:Good  Fund of  Knowledge:Good  Language:Good   Psychomotor Activity  Psychomotor Activity:Normal   Assets  Assets:Desire for Improvement; Physical Health; Resilience   Sleep  Sleep:Good  Number of hours: 0 (Patient did not provide details regarding number of hours of sleep.)   No data recorded  Physical Exam: Physical Exam Vitals reviewed.  HENT:     Head: Normocephalic.  Cardiovascular:     Rate and Rhythm: Normal rate and regular rhythm.  Pulmonary:     Effort: Pulmonary effort is normal.  Neurological:     Mental Status: He is oriented to person, place, and time.  Psychiatric:        Attention and Perception: Attention normal.        Mood and Affect: Mood normal.        Speech: Speech normal.        Behavior: Behavior normal.        Thought Content: Thought content normal.        Cognition and Memory: Cognition normal.        Judgment: Judgment normal.   ROS Blood pressure 109/70, pulse 65, temperature 98.6 F (37 C), temperature source Oral, SpO2 97 %. There is no height or weight on file to calculate BMI.  Musculoskeletal: Strength & Muscle Tone: within normal limits Gait & Station: normal Patient leans: N/A   [facility] MSE Discharge Disposition for Follow up and Recommendations: Based on my evaluation the patient does not appear to have an emergency medical condition and can be discharged with resources and follow up care in outpatient services for Medication Management   Oneta Rack, NP 06/05/2021, 3:03 PM

## 2021-06-05 NOTE — ED Triage Notes (Signed)
Per EMS, [place] gas station called [place] for SOB. While he was alert and oriented he was not forthcoming with information stating "I don't know."  Lungs clear.    118/60 55HR 16 RR 96% RA

## 2021-06-05 NOTE — BH Assessment (Signed)
[name], Routine MR #[id number]; 30 years old who presents voluntarily to [facility] via [employer] unaccompanied.  Pt denied SI, HI and AVH.  Pt does not elaborate, answer only to yes and no questions. Pt reports that he is tired. Pt signed the MSE.

## 2021-06-05 NOTE — ED Provider Notes (Signed)
Emergency Medicine Provider Triage Evaluation Note  [name] , Warner 30 y.o. male  was evaluated in triage.  Pt complains of SOB. Extensive psych hx. Seen by [facility] earlier and wanted bus pass and food. States he has SOB currently. Some chest pain. Vague when answering questions. Occasionally does not answer all questions and just stares. No SI. HI, AVH. Denies illicit substance use  Review of Systems  Positive: Cp, sob Negative: Fever, emesis, abd pain, SI, HI  Physical Exam  BP 131/84 (BP Location: Right Arm)   Pulse (!) 50   Temp 97.7 F (36.5 C) (Oral)   Resp 16   SpO2 100%  Gen:   Awake, no distress   Resp:  Normal effort  MSK:   Moves extremities without difficulty  Psych:  Flat affect, Denies SI, HI, AVH Other:    Medical Decision Making  Medically screening exam initiated at 9:21 PM.  Appropriate orders placed.  [name] was informed that the remainder of the evaluation will be completed by another provider, this initial triage assessment does not replace that evaluation, and the importance of remaining in the ED until their evaluation is complete.  SOB   Adrian Flesch A, PA-C 06/05/21 2121    Alvira Monday, MD 06/06/21 2320

## 2021-06-05 NOTE — Discharge Instructions (Addendum)
Take all medications as prescribed. Keep all follow-up appointments as scheduled.  Do not consume alcohol or use illegal drugs while on prescription medications. Report any adverse effects from your medications to your primary care provider promptly.  In the event of recurrent symptoms or worsening symptoms, call 911, a crisis hotline, or go to the nearest emergency department for evaluation.   

## 2021-06-06 LAB — TROPONIN I (HIGH SENSITIVITY): Troponin I (High Sensitivity): 5 ng/L (ref <18)

## 2021-06-06 NOTE — ED Notes (Signed)
Called pt x3 for vitals; no answer

## 2021-06-06 NOTE — Discharge Instructions (Addendum)
Your work-up today was normal. Return here for new concerns.

## 2021-06-06 NOTE — ED Notes (Signed)
Pt discharged by provider.

## 2021-06-06 NOTE — ED Provider Notes (Signed)
[name] EMERGENCY DEPARTMENT Provider Note   CSN: [id number] Arrival date & time: 06/05/21  1941     History Chief Complaint  Patient presents with   Shortness of Breath    [name] is a 30 y.o. male.  The history is provided by the patient and medical records.  Shortness of Breath  30 y.o. M with hx of depression, homelessness, schizophrenia, presenting to the ED for SOB.  He is very vague in his complaints, stating "I don't know" to most questions asked.  He has essentially been sleeping in the lobby for the past 9+ hours.  He is well known to this hospital system, 87 visits in the past 6 months.  Past Medical History:  Diagnosis Date   Depression    Schizophrenia [facility])     Patient Active Problem List   Diagnosis Date Noted   Malingering 05/24/2021   Homelessness 05/24/2021   Alcohol abuse 03/21/2021   Substance-induced disorder (HCC) 01/02/2021   Schizophrenia, paranoid type (HCC) 10/24/2020   Paranoid schizophrenia (HCC) 01/05/2020   Other schizophrenia (HCC) 02/14/2018   Cannabis use disorder, moderate, dependence (HCC) 05/10/2016    Past Surgical History:  Procedure Laterality Date   BACK SURGERY         No family history on file.  Social History   Tobacco Use   Smoking status: Some Days    Types: Cigarettes   Smokeless tobacco: Never  Vaping Use   Vaping Use: Never used  Substance Use Topics   Alcohol use: No   Drug use: Yes    Types: Marijuana    Comment: rare    Home Medications Prior to Admission medications   Medication Sig Start Date End Date Taking? Authorizing Provider  Paliperidone Palmitate (INVEGA TRINZA IM) Inject into the muscle every 3 (three) months.    [provider]  paliperidone (INVEGA) 6 MG 24 hr tablet Take 1 tablet (6 mg total) by mouth daily for 14 days. Patient not taking: No sig reported 01/02/21 02/22/21  Mare Ferrari, PA-C    Allergies    Shellfish allergy and Bee  pollen  Review of Systems   Review of Systems  Respiratory:  Positive for shortness of breath.   All other systems reviewed and are negative.  Physical Exam Updated Vital Signs BP 103/60 (BP Location: Right Arm)   Pulse (!) 42   Temp (!) 97.5 F (36.4 C)   Resp 18   SpO2 100%   Physical Exam Vitals and nursing note reviewed.  Constitutional:      Appearance: He is well-developed.     Comments: Sleeping, awoken for exam  HENT:     Head: Normocephalic and atraumatic.  Eyes:     Conjunctiva/sclera: Conjunctivae normal.     Pupils: Pupils are equal, round, and reactive to light.  Cardiovascular:     Rate and Rhythm: Normal rate and regular rhythm.     Heart sounds: Normal heart sounds.  Pulmonary:     Effort: Pulmonary effort is normal.     Breath sounds: Normal breath sounds. No wheezing or rhonchi.     Comments: Lungs CTAB, no distress noted Abdominal:     General: Bowel sounds are normal.     Palpations: Abdomen is soft.  Musculoskeletal:        General: Normal range of motion.     Cervical back: Normal range of motion.  Skin:    General: Skin is warm and dry.  Neurological:  Mental Status: He is alert and oriented to person, place, and time.    ED Results / Procedures / Treatments   Labs (all labs ordered are listed, but only abnormal results are displayed) Labs Reviewed  CBC WITH DIFFERENTIAL/PLATELET - Abnormal; Notable for the following components:      Result Value   Neutro Abs 1.5 (*)    All other components within normal limits  BASIC METABOLIC PANEL  TROPONIN I (HIGH SENSITIVITY)  TROPONIN I (HIGH SENSITIVITY)    EKG EKG Interpretation  Date/Time:  Sunday June 05 2021 21:18:20 EDT Ventricular Rate:  54 PR Interval:  140 QRS Duration: 102 QT Interval:  426 QTC Calculation: 403 R Axis:   74 Text Interpretation: Sinus bradycardia Early repolarization Otherwise normal ECG When compared with ECG of 05/23/2021, No significant change was found  Confirmed by Glick, David ([id number]) on 06/05/2021 11:44:47 PM  Radiology DG Chest 2 View  Result Date: 06/05/2021 CLINICAL DATA:  Shortness of breath. EXAM: CHEST - 2 VIEW COMPARISON:  May 07, 2021 FINDINGS: The heart size and mediastinal contours are within normal limits. Both lungs are clear. The visualized skeletal structures are unremarkable. IMPRESSION: No active cardiopulmonary disease. Electronically Signed   By: Thaddeus  Houston M.D.   On: 06/05/2021 22:05    Procedures Procedures   Medications Ordered in ED Medications - No data to display  ED Course  I have reviewed the triage vital signs and the nursing notes.  Pertinent labs & imaging results that were available during my care of the patient were reviewed by me and considered in my medical decision making (see chart for details).    MDM Rules/Calculators/A&P                           30  y.o. M here for SOB.  Vague with his complaints, not providing any details.  Work-up here is reassuring.  Has been sleeping in the lobby for the past 9+ hours.  He is well known to this hospital system, 87 visits in the past 6 months.  Appears stable for discharge.  Return here for new concerns.  Final Clinical Impression(s) / ED Diagnoses Final diagnoses:  Shortness of breath    Rx / DC Orders ED Discharge Orders     None        , PA-C 06/06/21 0458    06/08/21, MD 06/06/21 [phone]

## 2021-06-06 NOTE — ED Notes (Signed)
Called pt for vitals x3; no answer

## 2021-06-30 ENCOUNTER — Encounter (HOSPITAL_COMMUNITY): Payer: Self-pay | Admitting: Emergency Medicine

## 2021-06-30 ENCOUNTER — Other Ambulatory Visit: Payer: Self-pay

## 2021-06-30 ENCOUNTER — Emergency Department (HOSPITAL_COMMUNITY)
Admission: EM | Admit: 2021-06-30 | Discharge: 2021-07-01 | Disposition: A | Discharge status: Home / Self Care | Payer: Medicaid - Out of State | Attending: Emergency Medicine | Admitting: Emergency Medicine

## 2021-06-30 DIAGNOSIS — F17210 Nicotine dependence, cigarettes, uncomplicated: Secondary | ICD-10-CM | POA: Diagnosis not present

## 2021-06-30 DIAGNOSIS — Z765 Malingerer [conscious simulation]: Secondary | ICD-10-CM | POA: Insufficient documentation

## 2021-06-30 DIAGNOSIS — Z79899 Other long term (current) drug therapy: Secondary | ICD-10-CM | POA: Diagnosis not present

## 2021-06-30 DIAGNOSIS — Z20822 Contact with and (suspected) exposure to covid-19: Secondary | ICD-10-CM | POA: Diagnosis not present

## 2021-06-30 LAB — COMPREHENSIVE METABOLIC PANEL
ALT: 16 U/L (ref 0–44)
AST: 21 U/L (ref 15–41)
Albumin: 4.3 g/dL (ref 3.5–5.0)
Alkaline Phosphatase: 42 U/L (ref 38–126)
Anion gap: 8 (ref 5–15)
BUN: 11 mg/dL (ref 6–20)
CO2: 29 mmol/L (ref 22–32)
Calcium: 9.6 mg/dL (ref 8.9–10.3)
Chloride: 101 mmol/L (ref 98–111)
Creatinine, Ser: 1.16 mg/dL (ref 0.61–1.24)
GFR, Estimated: >60 mL/min (ref >60)
Glucose, Bld: 90 mg/dL (ref 70–99)
Potassium: 3.8 mmol/L (ref 3.5–5.1)
Sodium: 138 mmol/L (ref 135–145)
Total Bilirubin: 1 mg/dL (ref 0.3–1.2)
Total Protein: 7.1 g/dL (ref 6.5–8.1)

## 2021-06-30 LAB — CBC
HCT: 46.4 % (ref 39.0–52.0)
Hemoglobin: 15.5 g/dL (ref 13.0–17.0)
MCH: 30.3 pg (ref 26.0–34.0)
MCHC: 33.4 g/dL (ref 30.0–36.0)
MCV: 90.8 fL (ref 80.0–100.0)
Platelets: 265 10*3/uL (ref 150–400)
RBC: 5.11 MIL/uL (ref 4.22–5.81)
RDW: 12.7 % (ref 11.5–15.5)
WBC: 6.1 10*3/uL (ref 4.0–10.5)
nRBC: 0 % (ref 0.0–0.2)

## 2021-06-30 LAB — RESP PANEL BY RT-PCR (FLU A&B, COVID) ARPGX2
Influenza A by PCR: NEGATIVE
Influenza B by PCR: NEGATIVE
SARS Coronavirus 2 by RT PCR: NEGATIVE

## 2021-06-30 LAB — ETHANOL: Alcohol, Ethyl (B): <10 mg/dL (ref <10)

## 2021-06-30 LAB — ACETAMINOPHEN LEVEL: Acetaminophen (Tylenol), Serum: <10 ug/mL — ABNORMAL LOW (ref 10–30)

## 2021-06-30 LAB — SALICYLATE LEVEL: Salicylate Lvl: <7 mg/dL — ABNORMAL LOW (ref 7.0–30.0)

## 2021-06-30 NOTE — ED Provider Notes (Signed)
Emergency Medicine Provider Triage Evaluation Note  [name] , a 30 y.o. male  was evaluated in triage.  Pt is alert and oriented, however unable to express why he is here.  Continuously states "I do not know what is going on."  Tangential and circumferential speech with flight of ideas noted on triage.  Patient denies SI or HI, endorses visual hallucinations, denies auditory hallucinations.  Review of Systems  Positive: Visual hallucinations Negative: Auditory hallucinations, SI/HI  Physical Exam  BP 131/88 (BP Location: Right Arm)   Pulse 74   Temp 99 F (37.2 C) (Oral)   Resp 18   SpO2 100%  Gen:   Awake, no distress  Resp:  Normal effort  MSK:   Moves extremities without difficulty    Medical Decision Making  Medically screening exam initiated at 9:30 PM.  Appropriate orders placed.  [name] was informed that the remainder of the evaluation will be completed by another provider, this initial triage assessment does not replace that evaluation, and the importance of remaining in the ED until their evaluation is complete.     Vear Clock 06/30/21 2132    Virgina Norfolk, DO 06/30/21 2313

## 2021-06-30 NOTE — ED Triage Notes (Signed)
Patient requesting psychiatric treatment for his psychosis with auditory hallucinations , denies SI or HI , unable to focus / flight of ideas during encounter at triage .

## 2021-07-01 ENCOUNTER — Encounter (HOSPITAL_COMMUNITY): Payer: Self-pay | Admitting: Emergency Medicine

## 2021-07-01 ENCOUNTER — Ambulatory Visit (HOSPITAL_COMMUNITY)
Admission: EM | Admit: 2021-07-01 | Discharge: 2021-07-01 | Disposition: A | Discharge status: Home / Self Care | Payer: Medicaid Other | Attending: Psychiatry | Admitting: Psychiatry

## 2021-07-01 ENCOUNTER — Other Ambulatory Visit: Payer: Self-pay

## 2021-07-01 DIAGNOSIS — F1010 Alcohol abuse, uncomplicated: Secondary | ICD-10-CM | POA: Insufficient documentation

## 2021-07-01 DIAGNOSIS — Z5900 Homelessness unspecified: Secondary | ICD-10-CM | POA: Insufficient documentation

## 2021-07-01 LAB — RAPID URINE DRUG SCREEN, HOSP PERFORMED
Amphetamines: NOT DETECTED
Barbiturates: NOT DETECTED
Benzodiazepines: NOT DETECTED
Cocaine: NOT DETECTED
Opiates: NOT DETECTED
Tetrahydrocannabinol: NOT DETECTED

## 2021-07-01 NOTE — ED Provider Notes (Signed)
Behavioral Health Urgent Care Medical Screening Exam  Patient Name: [name] MRN: [id number] Date of Evaluation: 07/01/21 Chief Complaint:   Diagnosis:  Final diagnoses:  Homelessness  Alcohol abuse    History of Present illness: [name] is a 30 y.o. male.  Patient presents voluntarily to [facility] behavioral health for walk-in assessment.  [place]  When asked what type of help [name] needs he states "I do not know."  Patient is assessed face-to-face by nurse practitioner.  He is seated, no acute distress.  He is alert and oriented, pleasant and cooperative during assessment.  He reports euthymic mood with congruent affect.   He denies suicidal and homicidal ideations.  He denies any history of suicide attempts, denies history of self-harm.  He contracts verbally for safety with this Clinical research associate.  He has normal speech and behavior.  He denies both auditory and visual hallucinations.    He denies paranoia.    [name] has been diagnosed with schizophrenia as well as substance-induced mood disorder.  Per medical record,he may be followed by [facility] act team.  He is a poor historian related to medication and compliance.  He states "I have not had a shot, I do not think I need one."  When questioned directly regarding the last administration of long-acting injectable medication he states "I do not know."  He reports he is homeless in [place].  He denies access to weapons.  He is not currently employed.  He endorses average sleep and appetite.  He endorses substance use including "beer."  He is vague regarding amount of alcohol used and last use of alcohol.  He states "I do not know."  Patient offered support and encouragement.  He gives verbal consent to speak with his [facility] act team.   Psychiatric Specialty Exam  Presentation  General Appearance:Appropriate for Environment; Casual  Eye Contact:Fair  Speech:Clear and  Coherent; Normal Rate  Speech Volume:Normal  Handedness:Right   Mood and Affect  Mood:Euthymic  Affect:Congruent   Thought Process  Thought Processes:Coherent  Descriptions of Associations:Intact  Orientation:Full (Time, Place and Person)  Thought Content:Logical  Diagnosis of Schizophrenia or Schizoaffective disorder in past: Yes  Duration of Psychotic Symptoms: Greater than six months  Hallucinations:None denies  Ideas of Reference:None  Suicidal Thoughts:No  Homicidal Thoughts:No   Sensorium  Memory:Immediate Fair; Recent Fair; Remote Fair  Judgment:Fair  Insight:Shallow   Executive Functions  Concentration:Fair  Attention Span:Fair  Recall:Fair  Fund of Knowledge:Good  Language:Good   Psychomotor Activity  Psychomotor Activity:Normal   Assets  Assets:Communication Skills; Leisure Time; Physical Health; Resilience; Social Support   Sleep  Sleep:Fair  Number of hours: 0 (Patient did not provide details regarding number of hours of sleep.)   No data recorded  Physical Exam: Physical Exam Vitals and nursing note reviewed.  Constitutional:      Appearance: Normal appearance. He is well-developed and normal weight.  HENT:     Head: Normocephalic and atraumatic.     Nose: Nose normal.  Cardiovascular:     Rate and Rhythm: Normal rate.  Pulmonary:     Effort: Pulmonary effort is normal.  Musculoskeletal:        General: Normal range of motion.     Cervical back: Normal range of motion.  Skin:    General: Skin is warm and dry.  Neurological:     Mental Status: He is alert and oriented to person, place, and time.  Psychiatric:  Attention and Perception: Attention and perception normal.        Mood and Affect: Mood and affect normal.        Speech: Speech normal.        Behavior: Behavior normal. Behavior is cooperative.        Thought Content: Thought content normal.        Cognition and Memory: Cognition and memory normal.         Judgment: Judgment normal.   Review of Systems  Constitutional: Negative.   HENT: Negative.    Eyes: Negative.   Respiratory: Negative.    Cardiovascular: Negative.   Gastrointestinal: Negative.   Genitourinary: Negative.   Musculoskeletal: Negative.   Skin: Negative.   Neurological: Negative.   Endo/Heme/Allergies: Negative.   Psychiatric/Behavioral:  Positive for substance abuse.   Blood pressure 124/76, pulse 73, temperature 97.7 F (36.5 C), temperature source Oral, resp. rate 18, height 5\' 9"  (1.753 m), weight 172 lb (78 kg), SpO2 99 %. Body mass index is 25.4 kg/m.  Musculoskeletal: Strength & Muscle Tone: within normal limits Gait & Station: normal Patient leans: N/A   [facility] MSE Discharge Disposition for Follow up and Recommendations: Based on my evaluation the patient does not appear to have an emergency medical condition and can be discharged with resources and follow up care in outpatient services for Medication Management and Individual Therapy Patient reviewed with Dr. . Follow-up with established outpatient psychiatry.   Lucianne Muss, FNP 07/01/2021, 8:36 AM

## 2021-07-01 NOTE — ED Notes (Signed)
Pt given sandwich bag and ginger ale.  

## 2021-07-01 NOTE — ED Provider Notes (Signed)
[name] EMERGENCY DEPARTMENT Provider Note   CSN: [id number] Arrival date & time: 06/30/21  1807     History Chief Complaint  Patient presents with   Schizophrenia    [name] is a 30 y.o. male.  The history is provided by the patient.  Illness Location:  Outdoors Quality:  Patient states he has no idea why he is here Severity:  Mild Onset quality:  Gradual Duration: chronic, homelessness. Timing:  Constant Progression:  Unchanged Chronicity:  Chronic Context:  Was seen multiple times this week for same at [facility] Relieved by:  Nothing Worsened by:  Nothing Ineffective treatments:  None Associated symptoms: no cough, no diarrhea, no fever, no rash, no rhinorrhea, no shortness of breath and no wheezing       Past Medical History:  Diagnosis Date   Depression    Schizophrenia [facility])     Patient Active Problem List   Diagnosis Date Noted   Malingering 05/24/2021   Homelessness 05/24/2021   Alcohol abuse 03/21/2021   Substance-induced disorder (HCC) 01/02/2021   Schizophrenia, paranoid type (HCC) 10/24/2020   Paranoid schizophrenia (HCC) 01/05/2020   Other schizophrenia (HCC) 02/14/2018   Cannabis use disorder, moderate, dependence (HCC) 05/10/2016    Past Surgical History:  Procedure Laterality Date   BACK SURGERY         No family history on file.  Social History   Tobacco Use   Smoking status: Some Days    Types: Cigarettes   Smokeless tobacco: Never  Vaping Use   Vaping Use: Never used  Substance Use Topics   Alcohol use: No   Drug use: Yes    Types: Marijuana    Comment: rare    Home Medications Prior to Admission medications   Medication Sig Start Date End Date Taking? Authorizing Provider  Paliperidone Palmitate (INVEGA TRINZA IM) Inject into the muscle every 3 (three) months.    [provider]  paliperidone (INVEGA) 6 MG 24 hr tablet Take 1 tablet (6 mg total) by mouth daily for 14 days. Patient not  taking: No sig reported 01/02/21 02/22/21  Mare Ferrari, PA-C    Allergies    Shellfish allergy and Bee pollen  Review of Systems   Review of Systems  Constitutional:  Negative for fever.  HENT:  Negative for rhinorrhea.   Eyes:  Negative for redness.  Respiratory:  Negative for cough, shortness of breath and wheezing.   Cardiovascular:  Negative for leg swelling.  Gastrointestinal:  Negative for diarrhea.  Genitourinary:  Negative for difficulty urinating.  Musculoskeletal:  Negative for neck stiffness.  Skin:  Negative for rash.  Neurological:  Negative for facial asymmetry.  Psychiatric/Behavioral:  Negative for hallucinations and suicidal ideas.   All other systems reviewed and are negative.  Physical Exam Updated Vital Signs BP 125/80   Pulse 61   Temp 98.7 F (37.1 C) (Oral)   Resp 15   SpO2 99%   Physical Exam Vitals and nursing note reviewed. Exam conducted with a chaperone present.  Constitutional:      General: He is not in acute distress.    Appearance: Normal appearance.  HENT:     Head: Normocephalic and atraumatic.     Nose: Nose normal.  Eyes:     Conjunctiva/sclera: Conjunctivae normal.     Pupils: Pupils are equal, round, and reactive to light.  Cardiovascular:     Rate and Rhythm: Normal rate and regular rhythm.     Pulses: Normal pulses.  Heart sounds: Normal heart sounds.  Pulmonary:     Effort: Pulmonary effort is normal.     Breath sounds: Normal breath sounds.  Abdominal:     General: Abdomen is flat. Bowel sounds are normal.     Palpations: Abdomen is soft.     Tenderness: There is no abdominal tenderness. There is no guarding.  Musculoskeletal:        General: Normal range of motion.     Cervical back: Normal range of motion and neck supple.  Skin:    General: Skin is warm and dry.     Capillary Refill: Capillary refill takes less than 2 seconds.  Neurological:     General: No focal deficit present.     Mental Status: He is  alert and oriented to person, place, and time.     Deep Tendon Reflexes: Reflexes normal.  Psychiatric:        Mood and Affect: Mood normal.        Behavior: Behavior normal.    ED Results / Procedures / Treatments   Labs (all labs ordered are listed, but only abnormal results are displayed) Labs Reviewed  SALICYLATE LEVEL - Abnormal; Notable for the following components:      Result Value   Salicylate Lvl <7.0 (*)    All other components within normal limits  ACETAMINOPHEN LEVEL - Abnormal; Notable for the following components:   Acetaminophen (Tylenol), Serum <10 (*)    All other components within normal limits  RESP PANEL BY RT-PCR (FLU A&B, COVID) ARPGX2  COMPREHENSIVE METABOLIC PANEL  ETHANOL  CBC  RAPID URINE DRUG SCREEN, HOSP PERFORMED    EKG None  Radiology No results found.  Procedures Procedures   Medications Ordered in ED Medications - No data to display  ED Course  I have reviewed the triage vital signs and the nursing notes.  Pertinent labs & imaging results that were available during my care of the patient were reviewed by me and considered in my medical decision making (see chart for details).   He is not having any acute symptoms.  No SI or HI, no AH or VH.  He has no emergency medicine conditions at this time.  He is stable for discharge with close follow up.     Final Clinical Impression(s) / ED Diagnoses Final diagnoses:  Malingering   Return for intractable cough, coughing up blood, fevers > 100.4 unrelieved by medication, shortness of breath, intractable vomiting, chest pain, shortness of breath, weakness, numbness, changes in speech, facial asymmetry, abdominal pain, passing out, Inability to tolerate liquids or food, cough, altered mental status or any concerns. No signs of systemic illness or infection. The patient is nontoxic-appearing on exam and vital signs are within normal limits. I have reviewed the triage vital signs and the nursing  notes. Pertinent labs & imaging results that were available during my care of the patient were reviewed by me and considered in my medical decision making (see chart for details). After history, exam, and medical workup I feel the patient has been appropriately medically screened and is safe for discharge home. Pertinent diagnoses were discussed with the patient. Patient was given return precautions. Rx / DC Orders ED Discharge Orders     None        Lunden Mcleish, MD 07/01/21 [id number]

## 2021-07-01 NOTE — BH Assessment (Signed)
"  I can see out my eyes". Pt reports diffiuclty seeing and needs medicaitons for it. Pt also reports being confused. Denies SI, HI, AVH. Pt is routine.

## 2021-07-01 NOTE — Progress Notes (Signed)
Shloima received his AVS, questions answered and he retrieved his personal belongings. ?

## 2021-07-01 NOTE — Progress Notes (Signed)
Rx Brief note: Eyvonne Mechanic Last dose info  Spoke with [employer], they said his injection was picked up 05/31/21 by RN but she could not see in [facility] notes that they had given it.  Likely they have not because they documented on 9/20 they were still trying to contact him.   Thanks! Lorenza Evangelist 07/01/2021 9:05 AM

## 2021-07-01 NOTE — Discharge Instructions (Addendum)

## 2021-07-04 ENCOUNTER — Encounter (HOSPITAL_COMMUNITY): Payer: Self-pay

## 2021-07-04 ENCOUNTER — Emergency Department (HOSPITAL_COMMUNITY)
Admission: EM | Admit: 2021-07-04 | Discharge: 2021-07-05 | Disposition: A | Discharge status: Home / Self Care | Payer: Medicaid Other | Attending: Emergency Medicine | Admitting: Emergency Medicine

## 2021-07-04 ENCOUNTER — Other Ambulatory Visit: Payer: Self-pay

## 2021-07-04 DIAGNOSIS — F419 Anxiety disorder, unspecified: Secondary | ICD-10-CM | POA: Diagnosis not present

## 2021-07-04 DIAGNOSIS — F17210 Nicotine dependence, cigarettes, uncomplicated: Secondary | ICD-10-CM | POA: Insufficient documentation

## 2021-07-04 NOTE — ED Triage Notes (Signed)
Pt states that he is having trouble "seeing people". Pt does not know how long he has had this problem.

## 2021-07-05 MED ORDER — HYDROXYZINE HCL 10 MG PO TABS
10.0000 mg | ORAL_TABLET | Freq: Once | ORAL | Status: AC
  Administered 2021-07-05: 10 mg via ORAL
  Filled 2021-07-05: qty 1

## 2021-07-05 NOTE — ED Provider Notes (Signed)
[facility]-EMERGENCY DEPT Provider Note   CSN: [id number] Arrival date & time: 07/04/21  2055     History Chief Complaint  Patient presents with   Loss of Vision    [name] is a 30 y.o. male.  Patient denies vision loss at this time.  He states that he is here because he needs medication to get his life straight.  It appears from review the records that he is supposed to be getting his [place] shot from [place].  They have had it for over a month and he has not go to get it.  I informed him of this and he states that he did not know that.  He states he wants some Xanax right now.       Past Medical History:  Diagnosis Date   Depression    Schizophrenia [facility])     Patient Active Problem List   Diagnosis Date Noted   Malingering 05/24/2021   Homelessness 05/24/2021   Alcohol abuse 03/21/2021   Substance-induced disorder (HCC) 01/02/2021   Schizophrenia, paranoid type (HCC) 10/24/2020   Paranoid schizophrenia (HCC) 01/05/2020   Other schizophrenia (HCC) 02/14/2018   Cannabis use disorder, moderate, dependence (HCC) 05/10/2016    Past Surgical History:  Procedure Laterality Date   BACK SURGERY         History reviewed. No pertinent family history.  Social History   Tobacco Use   Smoking status: Some Days    Types: Cigarettes   Smokeless tobacco: Never  Vaping Use   Vaping Use: Never used  Substance Use Topics   Alcohol use: No   Drug use: Yes    Types: Marijuana    Comment: rare    Home Medications Prior to Admission medications   Medication Sig Start Date End Date Taking? Authorizing Provider  Paliperidone Palmitate (INVEGA TRINZA IM) Inject into the muscle every 3 (three) months.    [provider]  paliperidone (INVEGA) 6 MG 24 hr tablet Take 1 tablet (6 mg total) by mouth daily for 14 days. Patient not taking: No sig reported 01/02/21 02/22/21  Mare Ferrari, PA-C    Allergies    Shellfish allergy and Bee  pollen  Review of Systems   Review of Systems  All other systems reviewed and are negative.  Physical Exam Updated Vital Signs BP 128/84 (BP Location: Right Arm)   Pulse 61   Temp 98 F (36.7 C) (Oral)   Resp 16   Ht 5\' 5"  (1.651 m)   Wt 74.8 kg   SpO2 100%   BMI 27.46 kg/m   Physical Exam Vitals and nursing note reviewed.  Constitutional:      Appearance: He is well-developed.  HENT:     Head: Normocephalic and atraumatic.     Mouth/Throat:     Mouth: Mucous membranes are moist.     Pharynx: Oropharynx is clear.  Eyes:     Pupils: Pupils are equal, round, and reactive to light.  Cardiovascular:     Rate and Rhythm: Normal rate.  Pulmonary:     Effort: Pulmonary effort is normal. No respiratory distress.  Abdominal:     General: There is no distension.  Musculoskeletal:        General: Normal range of motion.     Cervical back: Normal range of motion.  Skin:    General: Skin is warm and dry.  Neurological:     General: No focal deficit present.     Mental Status: He  is alert.  Psychiatric:        Mood and Affect: Affect is flat.        Thought Content: Thought content is paranoid. Thought content is not delusional. Thought content does not include homicidal or suicidal ideation. Thought content does not include homicidal or suicidal plan.    ED Results / Procedures / Treatments   Labs (all labs ordered are listed, but only abnormal results are displayed) Labs Reviewed - No data to display  EKG None  Radiology No results found.  Procedures Procedures   Medications Ordered in ED Medications  hydrOXYzine (ATARAX/VISTARIL) tablet 10 mg (has no administration in time range)    ED Course  I have reviewed the triage vital signs and the nursing notes.  Pertinent labs & imaging results that were available during my care of the patient were reviewed by me and considered in my medical decision making (see chart for details).    MDM  Rules/Calculators/A&P                         Will offer vistaril here. Encouraged following up with outpatient providers for further management of symptoms.    Final Clinical Impression(s) / ED Diagnoses Final diagnoses:  Anxiety    Rx / DC Orders ED Discharge Orders     None        Fady Stamps, Barbara Cower, MD 07/05/21 [phone]

## 2021-08-05 ENCOUNTER — Other Ambulatory Visit: Payer: Self-pay

## 2021-08-05 ENCOUNTER — Ambulatory Visit (HOSPITAL_COMMUNITY)
Admission: EM | Admit: 2021-08-05 | Discharge: 2021-08-05 | Disposition: A | Discharge status: Home / Self Care | Payer: Medicaid Other | Attending: Nurse Practitioner | Admitting: Nurse Practitioner

## 2021-08-05 ENCOUNTER — Emergency Department (HOSPITAL_COMMUNITY)
Admission: EM | Admit: 2021-08-05 | Discharge: 2021-08-05 | Disposition: A | Discharge status: Home / Self Care | Payer: Medicaid Other | Attending: Emergency Medicine | Admitting: Emergency Medicine

## 2021-08-05 ENCOUNTER — Encounter (HOSPITAL_COMMUNITY): Payer: Self-pay

## 2021-08-05 DIAGNOSIS — F17210 Nicotine dependence, cigarettes, uncomplicated: Secondary | ICD-10-CM | POA: Diagnosis not present

## 2021-08-05 DIAGNOSIS — Z765 Malingerer [conscious simulation]: Secondary | ICD-10-CM | POA: Insufficient documentation

## 2021-08-05 DIAGNOSIS — Z79899 Other long term (current) drug therapy: Secondary | ICD-10-CM | POA: Diagnosis not present

## 2021-08-05 DIAGNOSIS — Y900 Blood alcohol level of less than 20 mg/100 ml: Secondary | ICD-10-CM | POA: Diagnosis not present

## 2021-08-05 DIAGNOSIS — Z8659 Personal history of other mental and behavioral disorders: Secondary | ICD-10-CM | POA: Insufficient documentation

## 2021-08-05 LAB — CBC WITH DIFFERENTIAL/PLATELET
Abs Immature Granulocytes: 0 10*3/uL (ref 0.00–0.07)
Basophils Absolute: 0 10*3/uL (ref 0.0–0.1)
Basophils Relative: 1 %
Eosinophils Absolute: 0.2 10*3/uL (ref 0.0–0.5)
Eosinophils Relative: 4 %
HCT: 43.1 % (ref 39.0–52.0)
Hemoglobin: 15.2 g/dL (ref 13.0–17.0)
Immature Granulocytes: 0 %
Lymphocytes Relative: 47 %
Lymphs Abs: 2.8 10*3/uL (ref 0.7–4.0)
MCH: 30.9 pg (ref 26.0–34.0)
MCHC: 35.3 g/dL (ref 30.0–36.0)
MCV: 87.6 fL (ref 80.0–100.0)
Monocytes Absolute: 0.5 10*3/uL (ref 0.1–1.0)
Monocytes Relative: 8 %
Neutro Abs: 2.4 10*3/uL (ref 1.7–7.7)
Neutrophils Relative %: 40 %
Platelets: 252 10*3/uL (ref 150–400)
RBC: 4.92 MIL/uL (ref 4.22–5.81)
RDW: 12.3 % (ref 11.5–15.5)
WBC: 5.9 10*3/uL (ref 4.0–10.5)
nRBC: 0 % (ref 0.0–0.2)

## 2021-08-05 LAB — COMPREHENSIVE METABOLIC PANEL
ALT: 12 U/L (ref 0–44)
AST: 19 U/L (ref 15–41)
Albumin: 4.7 g/dL (ref 3.5–5.0)
Alkaline Phosphatase: 36 U/L — ABNORMAL LOW (ref 38–126)
Anion gap: 8 (ref 5–15)
BUN: 22 mg/dL — ABNORMAL HIGH (ref 6–20)
CO2: 26 mmol/L (ref 22–32)
Calcium: 9.1 mg/dL (ref 8.9–10.3)
Chloride: 104 mmol/L (ref 98–111)
Creatinine, Ser: 1.18 mg/dL (ref 0.61–1.24)
GFR, Estimated: >60 mL/min (ref >60)
Glucose, Bld: 99 mg/dL (ref 70–99)
Potassium: 4.3 mmol/L (ref 3.5–5.1)
Sodium: 138 mmol/L (ref 135–145)
Total Bilirubin: 0.9 mg/dL (ref 0.3–1.2)
Total Protein: 7.2 g/dL (ref 6.5–8.1)

## 2021-08-05 LAB — ACETAMINOPHEN LEVEL: Acetaminophen (Tylenol), Serum: <10 ug/mL — ABNORMAL LOW (ref 10–30)

## 2021-08-05 LAB — ETHANOL: Alcohol, Ethyl (B): <10 mg/dL (ref <10)

## 2021-08-05 LAB — SALICYLATE LEVEL: Salicylate Lvl: <7 mg/dL — ABNORMAL LOW (ref 7.0–30.0)

## 2021-08-05 NOTE — ED Notes (Signed)
Patient given discharge instructions and verbalizes understanding, belonging returned, Patient in stable condition

## 2021-08-05 NOTE — Discharge Instructions (Addendum)
He was seen in the ER today.  Your blood work and physical exam was very reassuring.  There does not appear to be any emergent problem at this time.  Return to the ER with any new severe symptoms.

## 2021-08-05 NOTE — Discharge Instructions (Signed)

## 2021-08-05 NOTE — ED Notes (Signed)
Given DC papers in the lobby, explained, no questions at this time. Ambulatory our of the ED.

## 2021-08-05 NOTE — ED Provider Notes (Signed)
Behavioral Health Urgent Care Medical Screening Exam  Patient Name: [name] MRN: [id number] Date of Evaluation: 08/05/21 Chief Complaint:   Diagnosis:  Final diagnoses:  Malingering  History of schizophrenia    History of Present illness: [name] is a 30 y.o. male is a 30 y.o. male with a history of schizophrenia who presents to [facility] voluntarily. Patient is well known to behavioral health and area emergency departments. Patient is alert and oriented. He is calm and cooperative. He is very well groomed. He states he needs to rest. Patient denies suicidal ideations. He denies homicidal ideations. He denies auditory and visual hallucinations. No indication that he is responding to internal stimuli. When asked about housing he states "I don't know man." Responds "I don't know" to most other questions, which is his usual presentation.   Psychiatric Specialty Exam  Presentation  General Appearance:Appropriate for Environment; Well Groomed  Eye Contact:Fair  Speech:Clear and Coherent; Normal Rate  Speech Volume:Normal  Handedness:Right   Mood and Affect  Mood:Euthymic  Affect:Congruent   Thought Process  Thought Processes:Coherent  Descriptions of Associations:Intact  Orientation:Full (Time, Place and Person)  Thought Content:Logical  Diagnosis of Schizophrenia or Schizoaffective disorder in past: Yes  Duration of Psychotic Symptoms: Greater than six months  Hallucinations:None denies  Ideas of Reference:None  Suicidal Thoughts:No  Homicidal Thoughts:No   Sensorium  Memory:Immediate Fair; Recent Fair; Remote Fair  Judgment:Intact  Insight:Shallow   Executive Functions  Concentration:Fair  Attention Span:Fair  Recall:Fair  Fund of Knowledge:Good  Language:Good   Psychomotor Activity  Psychomotor Activity:Normal   Assets  Assets:Housing; Health and safety inspector; Physical Health; Resilience   Sleep  Sleep:Fair  Number of hours:  0 (Patient did not provide details regarding number of hours of sleep.)   No data recorded  Physical Exam: Physical Exam Constitutional:      General: He is not in acute distress.    Appearance: He is not ill-appearing, toxic-appearing or diaphoretic.  HENT:     Head: Normocephalic.     Right Ear: External ear normal.     Left Ear: External ear normal.  Eyes:     Pupils: Pupils are equal, round, and reactive to light.  Cardiovascular:     Rate and Rhythm: Normal rate.  Pulmonary:     Effort: Pulmonary effort is normal. No respiratory distress.  Musculoskeletal:        General: Normal range of motion.  Skin:    General: Skin is warm and dry.  Neurological:     Mental Status: He is alert and oriented to person, place, and time.  Psychiatric:        Speech: Speech normal.        Behavior: Behavior is cooperative.        Thought Content: Thought content does not include homicidal or suicidal ideation. Thought content does not include suicidal plan.   Review of Systems  Constitutional:  Negative for chills, diaphoresis, fever, malaise/fatigue and weight loss.  HENT:  Negative for congestion.   Respiratory:  Negative for cough and shortness of breath.   Cardiovascular:  Negative for chest pain and palpitations.  Gastrointestinal:  Negative for diarrhea, nausea and vomiting.  Neurological:  Negative for dizziness and seizures.  Psychiatric/Behavioral:  Negative for depression, hallucinations, memory loss, substance abuse and suicidal ideas. The patient has insomnia. The patient is not nervous/anxious.   All other systems reviewed and are negative.  Blood pressure 125/89, pulse 94, temperature 98.3 F (36.8 C), temperature source Tympanic, resp. rate 16, SpO2  98 %. There is no height or weight on file to calculate BMI.  Musculoskeletal: Strength & Muscle Tone: within normal limits Gait & Station: normal Patient leans: N/A   [facility] MSE Discharge Disposition for Follow up and  Recommendations: Based on my evaluation the patient does not appear to have an emergency medical condition and can be discharged with resources and follow up care in outpatient services for Medication Management and Individual Therapy  Disposition: No evidence of imminent risk to self or others at present.   Patient does not meet criteria for psychiatric inpatient admission. Supportive therapy provided about ongoing stressors. Discussed crisis plan, support from social network, calling 911, coming to the Emergency Department, and calling Suicide Hotline.   Jackelyn Poling, NP 08/05/2021, 1:59 AM

## 2021-08-05 NOTE — Progress Notes (Signed)
   08/05/21 0042  Patient Reported Information  How Did You Hear About [place]? Self  What Is the Reason for Your Visit/Call Today? Pt reports, "I can hardly breathe." Pt reports he wants an asthma tank then admits he does not have asthma. Pt reports, he has trouble seeing then asked clinicain if she was there. Pt denies, SI, HI,self-injurious behaviors and access to weapons. Pt reports, if discharged he can contract for safety.  How Long Has This Been Causing You Problems?  (Pt reports, "I don't know.")  What Do You Feel Would Help You the Most Today? Medication(s)  Have You Recently Had Any Thoughts About Hurting Yourself? No (Pt denies.)  Are You Planning to Commit Suicide/Harm Yourself At This time? No (Pt denies.)  Have you Recently Had Thoughts About Hurting Someone [name]? No (Pt denies.)  Are You Planning To Harm Someone At This Time? No (Pt denies.)  Have You Used Any Alcohol or Drugs in the Past 24 Hours? No (Pt denies.)  [facility] Screening Triage Referral Assessment  Type of Contact Face-to-Face  Location of Assessment [facility] Assessment Services  Provider location [facility] Assessment Services  Collateral Involvement None  Patient Determined To Be At Risk for Harm To Self or Others Based on Review of Patient Reported Information or Presenting Complaint? No  Does Patient Present under Involuntary Commitment? No  [place] of Residence [place]  Patient Currently Receiving the Following Services:  (UTA)  Determination of Need Routine (7 days)  Options For Referral Medication Management    Determination of need: Routine.     Redmond Pulling, MS, [facility], [facility] Triage Specialist [phone]

## 2021-08-05 NOTE — ED Triage Notes (Signed)
Patient states, "I can't think clearly."  Every question asked the patient would answer," I don't know. I'm slow."  Patient does deny suicidal thoughts, but when asked about drugs or alcohol patient states, "I don't know. I can't think."

## 2021-08-05 NOTE — ED Provider Notes (Signed)
[facility]-EMERG[facility] DEPT Provider Note   CSN: [id number] Arrival date & time: 08/05/21  6294     History No chief complaint on file.   [name] is a 30 y.o. male who presents to the ED this morning on foot. He is not able/willing to communicate why he is here. Continues to say he doesn't know.  States that he cannot see anything, however when he is directed to look about my face or following fingers with his eyes he is able to do that.  He will stop halfway through the evaluation and say "I cannot see her hands I cannot see anything at all".  Whenever he was asked how he ambulated to the ED if he cannot see at all he says "I do not know".  Additionally he says that he did do drugs last night but he does not know what drugs.  Patient was seen at [facility] this morning by psychiatric nurse practitioner, and psychiatrically cleared.  Patient frequents both the behavioral health urgent care and emergency departments as he is homeless.  Presented to behavioral health urgent care this morning denying any suicidality, homicidality, or AVH.  When asked why he presented there he repeatedly said "I do not know"..  Patient was discharged from [facility] after he reported that he you primarily was presenting to avoid the cold outside last night.  I have personally reviewed this patient's medical records.  He has history of substance abuse, homelessness, and schizophrenia.  HPI     Past Medical History:  Diagnosis Date   Depression    Schizophrenia [facility])     Patient Active Problem List   Diagnosis Date Noted   Malingering 05/24/2021   Homelessness 05/24/2021   Alcohol abuse 03/21/2021   Substance-induced disorder (HCC) 01/02/2021   Schizophrenia, paranoid type (HCC) 10/24/2020   Paranoid schizophrenia (HCC) 01/05/2020   Other schizophrenia (HCC) 02/14/2018   Cannabis use disorder, moderate, dependence (HCC) 05/10/2016    Past Surgical History:  Procedure Laterality Date    BACK SURGERY         History reviewed. No pertinent family history.  Social History   Tobacco Use   Smoking status: Some Days    Types: Cigarettes   Smokeless tobacco: Never  Vaping Use   Vaping Use: Never used  Substance Use Topics   Alcohol use: No   Drug use: Yes    Types: Marijuana    Comment: rare    Home Medications Prior to Admission medications   Medication Sig Start Date End Date Taking? Authorizing Provider  Paliperidone Palmitate (INVEGA TRINZA IM) Inject into the muscle every 3 (three) months.    [provider]  paliperidone (INVEGA) 6 MG 24 hr tablet Take 1 tablet (6 mg total) by mouth daily for 14 days. Patient not taking: No sig reported 01/02/21 02/22/21  Mare Ferrari, PA-C    Allergies    Shellfish allergy and Bee pollen  Review of Systems   Review of Systems  Unable to perform ROS: Other (Patient unwilling to answer ROS questions, just says "I don't know")   Physical Exam Updated Vital Signs BP 130/80 (BP Location: Right Arm)   Pulse 70   Temp 97.7 F (36.5 C) (Oral)   Resp 16   Ht 5\' 10"  (1.778 m)   Wt 74.8 kg   SpO2 100%   BMI 23.68 kg/m   Physical Exam Vitals and nursing note reviewed.  Constitutional:      Appearance: He is not  ill-appearing or toxic-appearing.  HENT:     Head: Normocephalic and atraumatic.     Nose: Nose normal. No congestion.     Mouth/Throat:     Mouth: Mucous membranes are moist.     Pharynx: Oropharynx is clear. Uvula midline. No oropharyngeal exudate or posterior oropharyngeal erythema.     Tonsils: No tonsillar exudate.  Eyes:     General: Lids are normal. Vision grossly intact.        Right eye: No discharge.        Left eye: No discharge.     Extraocular Movements: Extraocular movements intact.     Conjunctiva/sclera: Conjunctivae normal.     Pupils: Pupils are equal, round, and reactive to light.  Neck:     Trachea: Trachea and phonation normal.  Cardiovascular:     Rate and Rhythm:  Normal rate and regular rhythm.     Pulses: Normal pulses.     Heart sounds: Normal heart sounds. No murmur heard. Pulmonary:     Effort: Pulmonary effort is normal. No tachypnea, bradypnea, accessory muscle usage, prolonged expiration or respiratory distress.     Breath sounds: Normal breath sounds. No wheezing or rales.  Chest:     Chest wall: No mass, lacerations, deformity, swelling, tenderness, crepitus or edema.  Abdominal:     General: Bowel sounds are normal. There is no distension.     Palpations: Abdomen is soft.     Tenderness: There is no abdominal tenderness. There is no right CVA tenderness, left CVA tenderness, guarding or rebound.  Musculoskeletal:        General: No deformity.     Cervical back: Normal range of motion and neck supple. No edema, rigidity or crepitus. No pain with movement, spinous process tenderness or muscular tenderness.     Right lower leg: No edema.     Left lower leg: No edema.  Lymphadenopathy:     Cervical: No cervical adenopathy.  Skin:    General: Skin is warm and dry.     Capillary Refill: Capillary refill takes less than 2 seconds.  Neurological:     General: No focal deficit present.     Mental Status: He is alert and oriented to person, place, and time. Mental status is at baseline.     GCS: GCS eye subscore is 4. GCS verbal subscore is 5. GCS motor subscore is 6.     Gait: Gait is intact. Gait normal.  Psychiatric:        Mood and Affect: Mood normal.        Speech: Speech is delayed.        Behavior: Behavior normal.        Thought Content: Thought content does not include homicidal or suicidal ideation.     Comments: Does not appear to responding to internal stimuli.     ED Results / Procedures / Treatments   Labs (all labs ordered are listed, but only abnormal results are displayed) Labs Reviewed  COMPREHENSIVE METABOLIC PANEL - Abnormal; Notable for the following components:      Result Value   BUN 22 (*)    Alkaline  Phosphatase 36 (*)    All other components within normal limits  ACETAMINOPHEN LEVEL - Abnormal; Notable for the following components:   Acetaminophen (Tylenol), Serum <10 (*)    All other components within normal limits  SALICYLATE LEVEL - Abnormal; Notable for the following components:   Salicylate Lvl <7.0 (*)    All other components  within normal limits  ETHANOL  CBC WITH DIFFERENTIAL/PLATELET  RAPID URINE DRUG SCREEN, HOSP PERFORMED    EKG None  Radiology No results found.  Procedures Procedures   Medications Ordered in ED Medications - No data to display  ED Course  I have reviewed the triage vital signs and the nursing notes.  Pertinent labs & imaging results that were available during my care of the patient were reviewed by me and considered in my medical decision making (see chart for details).    MDM Rules/Calculators/A&P                         30 year old male with history of substance abuse, schizophrenia, and homelessness.   VS normal on intake. Cardiopulmonary exam is normal, abdominal exam is benign. Scleral injection, EOMI, PERRL.  Patient A&O x 3. Does not appear to be responding to AVH.   CBC unremarkable. CMP unremarkable. ETOH, acetaminophen, and salicylate levels normal.   Patient remains stable. Continues to deny SI/HI/AVH. No further work up warranted in the ED at this time.   Terrill voiced understanding of his medical evaluation and treatment plan. Each of his questions was answered to his expressed satisfaction. Return precautions given. Patient is well-appearing, stable, and appropriate for discharge at this time.   This chart was dictated using voice recognition software, Dragon. Despite the best efforts of this provider to proofread and correct errors, errors may still occur which can change documentation meaning.   Final Clinical Impression(s) / ED Diagnoses Final diagnoses:  Malingering    Rx / DC Orders ED Discharge Orders     None         Sherrilee Gilles 08/05/21 1245    Wynetta Fines, MD 08/05/21 1329

## 2021-08-06 ENCOUNTER — Ambulatory Visit (HOSPITAL_COMMUNITY)
Admission: EM | Admit: 2021-08-06 | Discharge: 2021-08-06 | Disposition: A | Discharge status: Home / Self Care | Payer: Medicaid Other | Attending: Physician Assistant | Admitting: Physician Assistant

## 2021-08-06 DIAGNOSIS — F200 Paranoid schizophrenia: Secondary | ICD-10-CM | POA: Insufficient documentation

## 2021-08-06 DIAGNOSIS — Z008 Encounter for other general examination: Secondary | ICD-10-CM

## 2021-08-06 NOTE — Discharge Instructions (Signed)
Patient to keep all scheduled follow up psychiatric outpatient appointments.

## 2021-08-06 NOTE — Progress Notes (Signed)
   08/06/21 0845  [facility] Triage Screening (Walk-ins at [facility] only)  What Is the Reason for Your Visit/Call Today? Patient presents reporting he is having breathing problems, as he reported yesterday.  He was seen here and then presented to [facility] yesterday, and was psych cleared with plans to f/u with outpt treatment.  At this point, he is only saying, "Man, I'm tired."  He appears groggy, and cannot recall where he stayed last night.  He is denying SI, HI and AVH. He denies recent SA.  No other concerns to report.  How Long Has This Been Causing You Problems? <Week  Have You Recently Had Any Thoughts About Hurting Yourself? No  Are You Planning to Commit Suicide/Harm Yourself At This time? No  Have you Recently Had Thoughts About Hurting Someone [name]? No  Are You Planning To Harm Someone At This Time? No  Are you currently experiencing any auditory, visual or other hallucinations? No  Have You Used Any Alcohol or Drugs in the Past 24 Hours? No  Do you have any current medical co-morbidities that require immediate attention? No  Clinician description of patient physical appearance/behavior: Patient appears groggy, almost falls asleep during triage.  What Do You Feel Would Help You the Most Today? Treatment for Depression or other mood problem  If access to [facility] Urgent Care was not available, would you have sought care in the Emergency Department? No  Determination of Need Routine (7 days)  Options For Referral Outpatient Therapy;Medication Management

## 2021-08-06 NOTE — ED Provider Notes (Signed)
Behavioral Health Urgent Care Medical Screening Exam  Patient Name: [name] MRN: [id number] Date of Evaluation: 08/06/21 Chief Complaint:   Diagnosis:  Final diagnoses:  None    History of Present illness: [name] is a 30 y.o. male with a past psychiatric history significant for paranoid schizophrenia who was transported to [facility] by [employer].    During the encounter, patient reported that he is doing all right and feeling okay.  Patient reports that he called [employer] on himself due to being fearful for his life.  When patient was asked what he was afraid of, he replied by saying that he was unsure of what he was afraid of.  Patient reports that he is currently not taking any psychiatric medications at this time nor does he have a psychiatrist or therapist at the moment.  Patient endorses housing and states that he lives by himself.  Patient states that he does not know where he lives nor does he remember the address.  Patient was asked if he has any psychiatric issues to which he replied, "I'm tired and I cannot move."  It should be noted that patient is able to move and has been moving throughout the encounter.  Patient reports that he feels funny but denies experiencing any depression.  Patient further denies anxiety.  Patient gave provider permission to call his aunt to receive collateral.  During the conversation with the patient's aunt [name]), she expressed that she had just talked with the patient this morning and that they have plans to go and visit his family.  She reports that he is currently living in an apartment by himself and that she helps with maintaining the apartment from time to time.  She reports that she is willing to come pick the patient up once psychiatrically cleared.  Patient denied suicidal or homicidal ideations.  He further denied auditory or visual hallucination and did not appear to be responding to internal/external  stimuli.  Patient reports that he does not get much sleep but was unable to clarify the amount of sleep he receives.  Patient was unable to provide information on how many meals he has during the day.  Patient denies tobacco use, illicit drug use, or alcohol consumption.  Psychiatric Specialty Exam  Presentation  General Appearance:Appropriate for Environment; Well Groomed  Eye Contact:Fair  Speech:Clear and Coherent; Normal Rate  Speech Volume:Normal  Handedness:Right   Mood and Affect  Mood:Euthymic  Affect:Appropriate; Congruent   Thought Process  Thought Processes:Coherent  Descriptions of Associations:Intact  Orientation:Full (Time, Place and Person)  Thought Content:Logical  Diagnosis of Schizophrenia or Schizoaffective disorder in past: Yes  Duration of Psychotic Symptoms: Greater than six months  Hallucinations:None denies  Ideas of Reference:None  Suicidal Thoughts:No  Homicidal Thoughts:No   Sensorium  Memory:Immediate Fair; Recent Fair; Remote Fair  Judgment:Fair  Insight:Fair   Executive Functions  Concentration:Fair  Attention Span:Fair  Recall:Fair  Fund of Knowledge:Fair  Language:Good   Psychomotor Activity  Psychomotor Activity:Normal   Assets  Assets:Communication Skills; Desire for Improvement; Housing; Social Support   Sleep  Sleep:Poor (Patient states that he doesn't get sleep.)  Number of hours: 0 (Patient did not provide details regarding number of hours of sleep.)   No data recorded  Physical Exam: Physical Exam Psychiatric:        Attention and Perception: Attention and perception normal. He does not perceive auditory or visual hallucinations.        Mood and Affect: Mood  and affect normal.        Speech: Speech normal.        Behavior: Behavior is slowed. Behavior is cooperative.        Thought Content: Thought content normal. Thought content is not paranoid. Thought content does not include homicidal or  suicidal ideation. Thought content does not include suicidal plan.        Judgment: Judgment normal.   Review of Systems  Psychiatric/Behavioral:  Negative for depression, hallucinations, substance abuse and suicidal ideas. The patient is not nervous/anxious and does not have insomnia.   Blood pressure 130/90, pulse 62, temperature 98.3 F (36.8 C), temperature source Oral, resp. rate 18, SpO2 100 %. There is no height or weight on file to calculate BMI.  Musculoskeletal: Strength & Muscle Tone: within normal limits Gait & Station: normal Patient leans: N/A   [facility] MSE Discharge Disposition for Follow up and Recommendations: Patient denies suicidal and homicidal ideations.  He further denies auditory or visual hallucinations.  Patient does not appear to be in any acute distress and denies both depression and anxiety.  Patient does not appear to be a danger to himself and is able to contract for safety following the conclusion of the encounter.  Patient gave permission to contact aunt for collateral.  Patient aunt agreed to pick up patient once patient has been psychiatrically cleared.   Meta Hatchet, PA 08/06/2021, 11:01 AM

## 2021-08-09 ENCOUNTER — Telehealth (HOSPITAL_COMMUNITY): Payer: Self-pay

## 2021-08-09 NOTE — BH Assessment (Signed)
Care Management - Follow Up [facility] Discharges   Writer attempted to make contact with patient today and was unsuccessful.  Writer left a HIPPA compliant voice message.   

## 2021-08-15 ENCOUNTER — Ambulatory Visit (HOSPITAL_COMMUNITY)
Admission: EM | Admit: 2021-08-15 | Discharge: 2021-08-15 | Disposition: A | Discharge status: Home / Self Care | Payer: Medicaid Other | Attending: Urology | Admitting: Urology

## 2021-08-15 DIAGNOSIS — Z765 Malingerer [conscious simulation]: Secondary | ICD-10-CM

## 2021-08-15 DIAGNOSIS — F200 Paranoid schizophrenia: Secondary | ICD-10-CM | POA: Insufficient documentation

## 2021-08-15 NOTE — ED Provider Notes (Signed)
Behavioral Health Urgent Care Medical Screening Exam  Patient Name: [name] MRN: [id number] Date of Evaluation: 08/15/21 Chief Complaint:   Diagnosis:  Final diagnoses:  None    History of Present illness: [name] is a 30 y.o. male with psychiatric history of paranoid schizophrenia.  Patient presented voluntarily to [facility] with chief complaint of "I am tired and need to sleep."  Patient is seen face-to-face and his chart was reviewed by this provider.  On approach, patient is alert and oriented x4, patient is calm and cooperative. patient's mood is euthymic and his affect is congruent with his mood.  Patient is speaking in a normal tone of voice at moderate rate with good eye contact.    Patient reported that he is tired and that he wants to sleep.  He denies any psychiatric and medical complaints. he states that he is unsure at the last time that he took his medication and the names of the medication that he is currently on.   Patient did not appear to be currently psychotic. he denies suicidal ideation, homicidal ideation, paranoia, hallucination and no delusional thought content noted.  Writer asked patient multiple times if he needed psychiatric or medical assistant patient continued to state that he came to [facility] to sleep.     Psychiatric Specialty Exam  Presentation  General Appearance:Appropriate for Environment; Well Groomed  Eye Contact:Fair  Speech:Clear and Coherent; Normal Rate  Speech Volume:Normal  Handedness:Right   Mood and Affect  Mood:Euthymic  Affect:Appropriate; Congruent   Thought Process  Thought Processes:Coherent  Descriptions of Associations:Intact  Orientation:Full (Time, Place and Person)  Thought Content:Logical  Diagnosis of Schizophrenia or Schizoaffective disorder in past: Yes  Duration of Psychotic Symptoms: Greater than six months  Hallucinations:None denies  Ideas of Reference:None  Suicidal Thoughts:No  Homicidal  Thoughts:No   Sensorium  Memory:Immediate Fair; Recent Fair; Remote Fair  Judgment:Fair  Insight:Fair   Executive Functions  Concentration:Fair  Attention Span:Fair  Recall:Fair  Fund of Knowledge:Fair  Language:Good   Psychomotor Activity  Psychomotor Activity:Normal   Assets  Assets:Communication Skills; Desire for Improvement; Housing; Social Support   Sleep  Sleep:Poor (Patient states that he doesn't get sleep.)  Number of hours: 0 (Patient did not provide details regarding number of hours of sleep.)   No data recorded  Physical Exam: Physical Exam Vitals and nursing note reviewed.  Constitutional:      Appearance: He is well-developed.  HENT:     Head: Normocephalic and atraumatic.  Eyes:     Conjunctiva/sclera: Conjunctivae normal.  Cardiovascular:     Rate and Rhythm: Normal rate and regular rhythm.     Heart sounds: No murmur heard. Pulmonary:     Effort: Pulmonary effort is normal. No respiratory distress.     Breath sounds: Normal breath sounds.  Abdominal:     Palpations: Abdomen is soft.     Tenderness: There is no abdominal tenderness.  Musculoskeletal:     Cervical back: Neck supple.  Skin:    General: Skin is warm and dry.  Neurological:     Mental Status: He is alert.  Psychiatric:        Attention and Perception: Attention and perception normal.        Mood and Affect: Mood and affect normal.        Speech: Speech normal.        Behavior: Behavior normal. Behavior is cooperative.        Thought Content: Thought content normal.  Cognition and Memory: Cognition normal.   Review of Systems  Constitutional: Negative.   HENT: Negative.    Eyes: Negative.   Respiratory: Negative.    Cardiovascular: Negative.   Gastrointestinal: Negative.   Genitourinary: Negative.   Musculoskeletal: Negative.   Skin: Negative.   Neurological: Negative.   Endo/Heme/Allergies: Negative.   Psychiatric/Behavioral: Negative.    Blood  pressure 129/90, pulse 85, temperature 98.5 F (36.9 C), temperature source Oral, resp. rate 20, SpO2 96 %. There is no height or weight on file to calculate BMI.  Musculoskeletal: Strength & Muscle Tone: within normal limits Gait & Station: normal Patient leans: Right   [facility] MSE Discharge Disposition for Follow up and Recommendations: Based on my evaluation the patient does not appear to have an emergency medical condition and can be discharged with resources and follow up care in outpatient services for Medication Management and Individual Therapy   Maricela Bo, NP 08/15/2021, 6:18 AM

## 2021-08-15 NOTE — BH Assessment (Signed)
[name], Routine MR #[id number]; 30 years old presents this date unaccompanied.  Pt denies SI, HI or AVH. Pt states he is tired and homeless, "I needed to rest".  Pt admits to prior MH diagnosis or prescribed medication for symptom management.  MSE signed by patient.

## 2021-08-15 NOTE — Progress Notes (Signed)
   08/15/21 0531  [facility] Triage Screening (Walk-ins at [facility] only)  How Did You Hear About [place]? Self  What Is the Reason for Your Visit/Call Today? Fatigue, homeless  How Long Has This Been Causing You Problems? <Week  Have You Recently Had Any Thoughts About Hurting Yourself? No  Are You Planning to Commit Suicide/Harm Yourself At This time? No  Have you Recently Had Thoughts About Hurting Someone [name]? No  Are You Planning To Harm Someone At This Time? No  Are you currently experiencing any auditory, visual or other hallucinations? No  How long ago did you use Drugs or Alcohol? Pt reports "I don't know when I used marijuana"  What Did You Use and How Much? marijuana  Do you have any current medical co-morbidities that require immediate attention? No  Clinician description of patient physical appearance/behavior: fatigue  What Do You Feel Would Help You the Most Today? Treatment for Depression or other mood problem;Food Assistance;Housing Assistance  If access to [facility] was not available, would you have sought care in the Emergency Department? No  Determination of Need Routine (7 days)  Options For Referral Medication Management

## 2021-08-15 NOTE — Discharge Instructions (Signed)

## 2021-08-16 ENCOUNTER — Other Ambulatory Visit: Payer: Self-pay

## 2021-08-16 ENCOUNTER — Emergency Department (HOSPITAL_COMMUNITY): Payer: Medicaid - Out of State

## 2021-08-16 ENCOUNTER — Ambulatory Visit (HOSPITAL_COMMUNITY)
Admission: EM | Admit: 2021-08-16 | Discharge: 2021-08-17 | Disposition: A | Discharge status: Home / Self Care | Payer: Medicaid Other | Attending: Nurse Practitioner | Admitting: Nurse Practitioner

## 2021-08-16 ENCOUNTER — Encounter (HOSPITAL_COMMUNITY): Payer: Self-pay | Admitting: Emergency Medicine

## 2021-08-16 ENCOUNTER — Emergency Department (HOSPITAL_COMMUNITY)
Admission: EM | Admit: 2021-08-16 | Discharge: 2021-08-16 | Disposition: A | Discharge status: Home / Self Care | Payer: Medicaid - Out of State | Attending: Emergency Medicine | Admitting: Emergency Medicine

## 2021-08-16 DIAGNOSIS — R0682 Tachypnea, not elsewhere classified: Secondary | ICD-10-CM | POA: Diagnosis not present

## 2021-08-16 DIAGNOSIS — Z5900 Homelessness unspecified: Secondary | ICD-10-CM | POA: Insufficient documentation

## 2021-08-16 DIAGNOSIS — Z20822 Contact with and (suspected) exposure to covid-19: Secondary | ICD-10-CM | POA: Insufficient documentation

## 2021-08-16 DIAGNOSIS — F17210 Nicotine dependence, cigarettes, uncomplicated: Secondary | ICD-10-CM | POA: Diagnosis not present

## 2021-08-16 DIAGNOSIS — J40 Bronchitis, not specified as acute or chronic: Secondary | ICD-10-CM | POA: Insufficient documentation

## 2021-08-16 DIAGNOSIS — Z765 Malingerer [conscious simulation]: Secondary | ICD-10-CM | POA: Insufficient documentation

## 2021-08-16 DIAGNOSIS — R0602 Shortness of breath: Secondary | ICD-10-CM | POA: Diagnosis present

## 2021-08-16 DIAGNOSIS — Z8659 Personal history of other mental and behavioral disorders: Secondary | ICD-10-CM | POA: Insufficient documentation

## 2021-08-16 LAB — RESP PANEL BY RT-PCR (FLU A&B, COVID) ARPGX2
Influenza A by PCR: NEGATIVE
Influenza B by PCR: NEGATIVE
SARS Coronavirus 2 by RT PCR: NEGATIVE

## 2021-08-16 MED ORDER — ALBUTEROL SULFATE (2.5 MG/3ML) 0.083% IN NEBU
5.0000 mg | INHALATION_SOLUTION | Freq: Once | RESPIRATORY_TRACT | Status: AC
  Administered 2021-08-16: 5 mg via RESPIRATORY_TRACT
  Filled 2021-08-16: qty 6

## 2021-08-16 MED ORDER — PREDNISONE 20 MG PO TABS
60.0000 mg | ORAL_TABLET | Freq: Once | ORAL | Status: DC
  Filled 2021-08-16: qty 3

## 2021-08-16 MED ORDER — ALBUTEROL SULFATE HFA 108 (90 BASE) MCG/ACT IN AERS
2.0000 | INHALATION_SPRAY | Freq: Once | RESPIRATORY_TRACT | Status: AC
  Administered 2021-08-16: 2 via RESPIRATORY_TRACT
  Filled 2021-08-16: qty 6.7

## 2021-08-16 MED ORDER — IPRATROPIUM BROMIDE 0.02 % IN SOLN
0.5000 mg | Freq: Once | RESPIRATORY_TRACT | Status: AC
  Administered 2021-08-16: 0.5 mg via RESPIRATORY_TRACT
  Filled 2021-08-16: qty 2.5

## 2021-08-16 NOTE — ED Notes (Signed)
Pt verbalizes understanding of discharge instructions. Opportunity for questions and answers were provided. Pt discharged from the ED.   ?

## 2021-08-16 NOTE — ED Triage Notes (Signed)
Pt BIB [facility], [place], c/o SOB. Pt states he does not know when SOB started. Does not know if anything makes it better or worse. Pt states he just feels like he can't hardly breathe. Denies known hx asthma. Audible wheezing noted throughout lung fields. Pt also has a hx of schizophrenia, has not been taking medications. States he does not know the last time he has taken his meds. Denies SI/HI, denies A/V hallucinations.  EMS VS- BP 160/84, HR 80, SpO2 95% RA.

## 2021-08-16 NOTE — Discharge Instructions (Addendum)

## 2021-08-16 NOTE — ED Provider Notes (Signed)
[place] EMERGENCY DEPARTMENT Provider Note   CSN: [id number] Arrival date & time: 08/16/21  [id number]     History Chief Complaint  Patient presents with   Shortness of Breath    [name] is a 30 y.o. male.  The history is provided by the patient. History limited by: pt with mental illness and some difficulty answering questions.  Shortness of Breath Severity:  Severe Onset quality:  Unable to specify Timing:  Constant Progression:  Unable to specify Chronicity:  New Context comment:  Cough and feeling sob Relieved by:  None tried Worsened by:  Activity Ineffective treatments:  None tried Associated symptoms: cough   Associated symptoms: no chest pain, no fever, no headaches, no sputum production and no vomiting   Risk factors comment:  Hx of schizophrenia, denies hx of asthma     Past Medical History:  Diagnosis Date   Depression    Schizophrenia [facility])     Patient Active Problem List   Diagnosis Date Noted   Malingering 05/24/2021   Homelessness 05/24/2021   Alcohol abuse 03/21/2021   Substance-induced disorder ([place]) 01/02/2021   Schizophrenia, paranoid type ([place]) 10/24/2020   Paranoid schizophrenia ([place]) 01/05/2020   Other schizophrenia ([place]) 02/14/2018   Cannabis use disorder, moderate, dependence ([place]) 05/10/2016    Past Surgical History:  Procedure Laterality Date   BACK SURGERY         No family history on file.  Social History   Tobacco Use   Smoking status: Some Days    Types: Cigarettes   Smokeless tobacco: Never  Vaping Use   Vaping Use: Never used  Substance Use Topics   Alcohol use: No   Drug use: Yes    Types: Marijuana    Comment: rare    Home Medications Prior to Admission medications   Medication Sig Start Date End Date Taking? Authorizing Provider  Paliperidone Palmitate (INVEGA TRINZA IM) Inject into the muscle every 3 (three) months.    [provider]  paliperidone (INVEGA) 6 MG 24 hr  tablet Take 1 tablet (6 mg total) by mouth daily for 14 days. Patient not taking: No sig reported 01/02/21 02/22/21  Garald Balding, PA-C    Allergies    Shellfish allergy and Bee pollen  Review of Systems   Review of Systems  Unable to perform ROS: Psychiatric disorder  Constitutional:  Negative for fever.  Respiratory:  Positive for cough and shortness of breath. Negative for sputum production.   Cardiovascular:  Negative for chest pain.  Gastrointestinal:  Negative for vomiting.  Neurological:  Negative for headaches.   Physical Exam Updated Vital Signs BP 122/74   Pulse 64   Temp 98.3 F (36.8 C) (Oral)   Resp 14   Ht 5\' 10"  (1.778 m)   Wt 72.6 kg   SpO2 100%   BMI 22.96 kg/m   Physical Exam Vitals and nursing note reviewed.  Constitutional:      General: He is not in acute distress.    Appearance: He is well-developed.  HENT:     Head: Normocephalic and atraumatic.     Mouth/Throat:     Mouth: Mucous membranes are moist.  Eyes:     Conjunctiva/sclera: Conjunctivae normal.     Pupils: Pupils are equal, round, and reactive to light.  Cardiovascular:     Rate and Rhythm: Normal rate and regular rhythm.     Heart sounds: No murmur heard. Pulmonary:     Effort: Pulmonary effort is  normal. Tachypnea present. No respiratory distress.     Breath sounds: Wheezing present. No rales.  Abdominal:     General: There is no distension.     Palpations: Abdomen is soft.     Tenderness: There is no abdominal tenderness. There is no guarding or rebound.  Musculoskeletal:        General: No tenderness. Normal range of motion.     Cervical back: Normal range of motion and neck supple.     Right lower leg: No edema.     Left lower leg: No edema.  Skin:    General: Skin is warm and dry.     Findings: No erythema or rash.  Neurological:     Mental Status: He is alert and oriented to person, place, and time. Mental status is at baseline.  Psychiatric:     Comments: Calm and  cooperative.  Does not seem to be responding to internal stimuli    ED Results / Procedures / Treatments   Labs (all labs ordered are listed, but only abnormal results are displayed) Labs Reviewed  RESP PANEL BY RT-PCR (FLU A&B, COVID) [id number]    EKG EKG Interpretation  Date/Time:  Tuesday August 16 2021 07:53:02 EST Ventricular Rate:  80 PR Interval:  127 QRS Duration: 90 QT Interval:  374 QTC Calculation: 432 R Axis:   81 Text Interpretation: Sinus rhythm LVH by voltage ST elevation suggests acute pericarditis No significant change since last tracing Confirmed by Blanchie Dessert [id number]) on 08/16/2021 8:46:14 AM  Radiology DG Chest Port 1 View  Result Date: 08/16/2021 CLINICAL DATA:  Shortness of breath.  Wheezing. EXAM: PORTABLE CHEST 1 VIEW COMPARISON:  06/05/2021 FINDINGS: Artifact overlies the chest. Heart size is normal. Mediastinal shadows are normal. The lungs are clear. No infiltrate, collapse or effusion. Overall normal lung volumes. IMPRESSION: No active disease. Electronically Signed   By: Nelson Chimes M.D.   On: 08/16/2021 08:21    Procedures Procedures   Medications Ordered in ED Medications  predniSONE (DELTASONE) tablet 60 mg (0 mg Oral Patient Refused/Not Given 08/16/21 0808)  albuterol (PROVENTIL) (2.5 MG/3ML) 0.083% nebulizer solution 5 mg (5 mg Nebulization Given 08/16/21 0815)  ipratropium (ATROVENT) nebulizer solution 0.5 mg (0.5 mg Nebulization Given 08/16/21 0815)  albuterol (PROVENTIL) (2.5 MG/3ML) 0.083% nebulizer solution 5 mg (5 mg Nebulization Given 08/16/21 1007)  ipratropium (ATROVENT) nebulizer solution 0.5 mg (0.5 mg Nebulization Given 08/16/21 1007)  albuterol (VENTOLIN HFA) 108 (90 Base) MCG/ACT inhaler 2 puff (2 puffs Inhalation Given 08/16/21 1007)    ED Course  I have reviewed the triage vital signs and the nursing notes.  Pertinent labs & imaging results that were available during my care of the patient were reviewed by me and considered  in my medical decision making (see chart for details).    MDM Rules/Calculators/A&P                           Patient is a 30 year old male with a history of schizophrenia who is presenting today complaining of shortness of breath.  Patient is unable to give many details and just continually says he is short of breath.  He is noted to be wheezing in all lung fields today.  Reports he does not use inhalers regularly.  Does use marijuana but does not smoke cigarettes.  He does report coughing but denies congestion or fever symptoms.  We will give albuterol/Atrovent and a dose of steroids.  Chest  x-ray and COVID are pending.  Currently oxygen saturation of 95 to 98% on room air.  11:02 AM Chest x-ray without acute process, EKG without acute process.  COVID and flu are negative.  On repeat evaluation wheezing has improved but still having some mild wheezing and was given a second treatment.  Patient refusing prednisone at this time.  Feel that he is stable for discharge and was given an albuterol inhaler.  11:02 AM Wheezing resolved.  Pt feeling better and will d/c home. MDM   Amount and/or Complexity of Data Reviewed Clinical lab tests: reviewed and ordered Tests in the radiology section of CPT: ordered and reviewed Tests in the medicine section of CPT: ordered and reviewed    Final Clinical Impression(s) / ED Diagnoses Final diagnoses:  Bronchitis    Rx / DC Orders ED Discharge Orders     None        Gwyneth Sprout, MD 08/16/21 1102

## 2021-08-16 NOTE — ED Provider Notes (Signed)
Behavioral Health Urgent Care Medical Screening Exam  Patient Name: [name] MRN: [id number] Date of Evaluation: 08/16/21 Chief Complaint:   Diagnosis:  Final diagnoses:  History of schizophrenia  Malingering    History of Present illness: [name] is a 30 y.o. male with a history of schizophrenia who reports voluntarily to [facility]. He is well known to behavioral health services. Patient states that he needs a place to sleep. On evaluation, the patient is alert and oriented, calm, and cooperative. Speech is clear and coherent. Mood is euthymic and affect is congruent with mood. Thought process is coherent and thought content is logical. Denies auditory and visual hallucinations. No indication that patient is responding to internal stimuli. No evidence of delusional thought content. Denies suicidal ideations. Denies homicidal ideations.   Psychiatric Specialty Exam  Presentation  General Appearance:Appropriate for Environment; Well Groomed  Eye Contact:Fair  Speech:Clear and Coherent; Normal Rate  Speech Volume:Normal  Handedness:Right   Mood and Affect  Mood:Euthymic  Affect:Appropriate; Congruent   Thought Process  Thought Processes:Coherent  Descriptions of Associations:Intact  Orientation:Full (Time, Place and Person)  Thought Content:Logical  Diagnosis of Schizophrenia or Schizoaffective disorder in past: Yes  Duration of Psychotic Symptoms: Greater than six months  Hallucinations:None denies  Ideas of Reference:None  Suicidal Thoughts:No  Homicidal Thoughts:No   Sensorium  Memory:Immediate Fair; Recent Fair  Judgment:Fair  Insight:Fair   Executive Functions  Concentration:Fair  Attention Span:Fair  Recall:Fair  Fund of Knowledge:Fair  Language:Good   Psychomotor Activity  Psychomotor Activity:Normal   Assets  Assets:Desire for Improvement; Financial Resources/Insurance; Housing; Physical Health   Sleep  Sleep:Fair  Number  of hours: 0 (Patient did not provide details regarding number of hours of sleep.)   No data recorded  Physical Exam: Physical Exam Constitutional:      General: He is not in acute distress.    Appearance: He is not ill-appearing, toxic-appearing or diaphoretic.  HENT:     Head: Normocephalic.     Right Ear: External ear normal.     Left Ear: External ear normal.  Eyes:     Pupils: Pupils are equal, round, and reactive to light.  Cardiovascular:     Rate and Rhythm: Normal rate.  Pulmonary:     Effort: Pulmonary effort is normal. No respiratory distress.  Musculoskeletal:        General: Normal range of motion.  Skin:    General: Skin is warm and dry.  Neurological:     Mental Status: He is alert and oriented to person, place, and time.  Psychiatric:        Mood and Affect: Mood is not depressed.        Speech: Speech normal.        Behavior: Behavior is cooperative.        Thought Content: Thought content does not include homicidal or suicidal ideation. Thought content does not include suicidal plan.   Review of Systems  Constitutional:  Negative for chills, diaphoresis, fever, malaise/fatigue and weight loss.  HENT:  Negative for congestion.   Respiratory:  Negative for cough and shortness of breath.   Cardiovascular:  Negative for chest pain and palpitations.  Gastrointestinal:  Negative for diarrhea, nausea and vomiting.  Neurological:  Negative for dizziness and seizures.  Psychiatric/Behavioral:  Negative for depression, memory loss and suicidal ideas.   All other systems reviewed and are negative.  Blood pressure (!) 122/91, pulse 94, temperature 98.7 F (37.1 C), temperature source Oral, resp. rate 18, SpO2 99 %.  There is no height or weight on file to calculate BMI.  Musculoskeletal: Strength & Muscle Tone: within normal limits Gait & Station: normal Patient leans: N/A   [facility] MSE Discharge Disposition for Follow up and Recommendations: Based on my  evaluation the patient does not appear to have an emergency medical condition and can be discharged with resources and follow up care in outpatient services for Medication Management and Individual Therapy   Jackelyn Poling, NP 08/16/2021, 11:35 PM

## 2021-08-16 NOTE — Discharge Instructions (Signed)
Use the inhaler every 4-6hrs 2 puff as needed for shortness of breath

## 2021-08-17 ENCOUNTER — Encounter (HOSPITAL_COMMUNITY): Payer: Self-pay

## 2021-08-17 ENCOUNTER — Emergency Department (HOSPITAL_COMMUNITY)
Admission: EM | Admit: 2021-08-17 | Discharge: 2021-08-17 | Disposition: A | Discharge status: Home / Self Care | Payer: Medicaid Other | Attending: Emergency Medicine | Admitting: Emergency Medicine

## 2021-08-17 DIAGNOSIS — Z5321 Procedure and treatment not carried out due to patient leaving prior to being seen by health care provider: Secondary | ICD-10-CM | POA: Diagnosis not present

## 2021-08-17 DIAGNOSIS — H547 Unspecified visual loss: Secondary | ICD-10-CM | POA: Diagnosis present

## 2021-08-17 NOTE — ED Triage Notes (Signed)
Pt BIB EMS, pt reports that he cannot see. Pt is looking directly at me.

## 2021-08-20 ENCOUNTER — Emergency Department (HOSPITAL_COMMUNITY)
Admission: EM | Admit: 2021-08-20 | Discharge: 2021-08-20 | Disposition: A | Discharge status: Home / Self Care | Payer: Medicaid Other | Attending: Emergency Medicine | Admitting: Emergency Medicine

## 2021-08-20 DIAGNOSIS — F209 Schizophrenia, unspecified: Secondary | ICD-10-CM

## 2021-08-20 DIAGNOSIS — F419 Anxiety disorder, unspecified: Secondary | ICD-10-CM

## 2021-08-20 DIAGNOSIS — Z20822 Contact with and (suspected) exposure to covid-19: Secondary | ICD-10-CM | POA: Insufficient documentation

## 2021-08-20 DIAGNOSIS — F17210 Nicotine dependence, cigarettes, uncomplicated: Secondary | ICD-10-CM | POA: Insufficient documentation

## 2021-08-20 LAB — BASIC METABOLIC PANEL
Anion gap: 8 (ref 5–15)
BUN: 15 mg/dL (ref 6–20)
CO2: 27 mmol/L (ref 22–32)
Calcium: 9.1 mg/dL (ref 8.9–10.3)
Chloride: 103 mmol/L (ref 98–111)
Creatinine, Ser: 1.22 mg/dL (ref 0.61–1.24)
GFR, Estimated: >60 mL/min (ref >60)
Glucose, Bld: 89 mg/dL (ref 70–99)
Potassium: 3.9 mmol/L (ref 3.5–5.1)
Sodium: 138 mmol/L (ref 135–145)

## 2021-08-20 LAB — RESP PANEL BY RT-PCR (FLU A&B, COVID) ARPGX2
Influenza A by PCR: NEGATIVE
Influenza B by PCR: NEGATIVE
SARS Coronavirus 2 by RT PCR: NEGATIVE

## 2021-08-20 LAB — CBC WITH DIFFERENTIAL/PLATELET
Abs Immature Granulocytes: 0.01 10*3/uL (ref 0.00–0.07)
Basophils Absolute: 0 10*3/uL (ref 0.0–0.1)
Basophils Relative: 1 %
Eosinophils Absolute: 0.4 10*3/uL (ref 0.0–0.5)
Eosinophils Relative: 6 %
HCT: 42.5 % (ref 39.0–52.0)
Hemoglobin: 14.8 g/dL (ref 13.0–17.0)
Immature Granulocytes: 0 %
Lymphocytes Relative: 43 %
Lymphs Abs: 2.5 10*3/uL (ref 0.7–4.0)
MCH: 30.6 pg (ref 26.0–34.0)
MCHC: 34.8 g/dL (ref 30.0–36.0)
MCV: 87.8 fL (ref 80.0–100.0)
Monocytes Absolute: 0.4 10*3/uL (ref 0.1–1.0)
Monocytes Relative: 7 %
Neutro Abs: 2.5 10*3/uL (ref 1.7–7.7)
Neutrophils Relative %: 43 %
Platelets: 232 10*3/uL (ref 150–400)
RBC: 4.84 MIL/uL (ref 4.22–5.81)
RDW: 12.4 % (ref 11.5–15.5)
WBC: 5.9 10*3/uL (ref 4.0–10.5)
nRBC: 0 % (ref 0.0–0.2)

## 2021-08-20 LAB — ETHANOL: Alcohol, Ethyl (B): <10 mg/dL (ref <10)

## 2021-08-20 MED ORDER — OLANZAPINE 5 MG PO TBDP: 5.0000 mg | ORAL_TABLET | Freq: Every day | ORAL | Status: DC

## 2021-08-20 MED ORDER — ALBUTEROL SULFATE HFA 108 (90 BASE) MCG/ACT IN AERS
2.0000 | INHALATION_SPRAY | Freq: Once | RESPIRATORY_TRACT | Status: AC
  Administered 2021-08-20: 2 via RESPIRATORY_TRACT
  Filled 2021-08-20: qty 6.7

## 2021-08-20 NOTE — ED Provider Notes (Signed)
[facility]-EMERGENCY DEPT Provider Note   CSN: [id number] Arrival date & time: 08/20/21  7408     History Chief Complaint  Patient presents with   Paranoid   Psychiatric Evaluation    [name] is a 29 y.o. male.  Patient is a 30 year old male with a history of schizophrenia, homelessness, polysubstance abuse and prior wheezing who is presenting today from jail with law enforcement due to patient having erratic behavior.  Patient had initially gone to the magistrate and when they looked up his name realized that he had a warrant out for his arrest.  When he got to jail patient locked himself in the bathroom.  He was reporting he did not know where he was he was having shortness of breath he was confused.  Here he reports that he needs help calming down.  He just keeps saying I do not know, I do not know, I do not know.  He denies taking any medications for some time but in the past had had Invega injections.  He will not give any further history.  Officers at bedside felt that he was responding to internal stimuli.  He is otherwise been cooperative.  When asked if he wanted to hurt himself or others he reports no.  The history is provided by the patient.      Past Medical History:  Diagnosis Date   Depression    Schizophrenia [facility])     Patient Active Problem List   Diagnosis Date Noted   Malingering 05/24/2021   Homelessness 05/24/2021   Alcohol abuse 03/21/2021   Substance-induced disorder (HCC) 01/02/2021   Schizophrenia, paranoid type (HCC) 10/24/2020   Paranoid schizophrenia (HCC) 01/05/2020   Other schizophrenia (HCC) 02/14/2018   Cannabis use disorder, moderate, dependence (HCC) 05/10/2016    Past Surgical History:  Procedure Laterality Date   BACK SURGERY         No family history on file.  Social History   Tobacco Use   Smoking status: Some Days    Types: Cigarettes   Smokeless tobacco: Never  Vaping Use   Vaping Use: Never  used  Substance Use Topics   Alcohol use: No   Drug use: Yes    Types: Marijuana    Comment: rare    Home Medications Prior to Admission medications   Medication Sig Start Date End Date Taking? Authorizing Provider  Paliperidone Palmitate (INVEGA TRINZA IM) Inject into the muscle every 3 (three) months.    [provider]  paliperidone (INVEGA) 6 MG 24 hr tablet Take 1 tablet (6 mg total) by mouth daily for 14 days. Patient not taking: No sig reported 01/02/21 02/22/21  Mare Ferrari, PA-C    Allergies    Shellfish allergy and Bee pollen  Review of Systems   Review of Systems  All other systems reviewed and are negative.  Physical Exam Updated Vital Signs BP 126/90 (BP Location: Left Arm)   Pulse 72   Temp 98.1 F (36.7 C) (Oral)   Resp 16   SpO2 97%   Physical Exam Vitals and nursing note reviewed.  Constitutional:      General: He is not in acute distress.    Appearance: He is well-developed.  HENT:     Head: Normocephalic and atraumatic.  Eyes:     Conjunctiva/sclera: Conjunctivae normal.     Pupils: Pupils are equal, round, and reactive to light.  Cardiovascular:     Rate and Rhythm: Normal rate and regular rhythm.  Heart sounds: No murmur heard. Pulmonary:     Effort: Pulmonary effort is normal. No respiratory distress.     Breath sounds: Normal breath sounds. No wheezing or rales.     Comments: Not taking deep breaths at this time but no audible wheezing Abdominal:     General: There is no distension.     Palpations: Abdomen is soft.     Tenderness: There is no abdominal tenderness. There is no guarding or rebound.  Musculoskeletal:        General: No tenderness. Normal range of motion.     Cervical back: Normal range of motion and neck supple.     Right lower leg: No edema.     Left lower leg: No edema.  Skin:    General: Skin is warm and dry.     Findings: No erythema or rash.  Neurological:     Mental Status: He is alert and oriented  to person, place, and time.  Psychiatric:     Comments: Reports he does not know if he is alive or dead.  States that he needs something to calm down.  Will not give any further history but does deny SI and HI.    ED Results / Procedures / Treatments   Labs (all labs ordered are listed, but only abnormal results are displayed) Labs Reviewed  RESP PANEL BY RT-PCR (FLU A&B, COVID) ARPGX2  CBC WITH DIFFERENTIAL/PLATELET  BASIC METABOLIC PANEL  ETHANOL  RAPID URINE DRUG SCREEN, HOSP PERFORMED    EKG None  Radiology No results found.  Procedures Procedures   Medications Ordered in ED Medications  albuterol (VENTOLIN HFA) 108 (90 Base) MCG/ACT inhaler 2 puff (has no administration in time range)  OLANZapine zydis (ZYPREXA) disintegrating tablet 5 mg (has no administration in time range)    ED Course  I have reviewed the triage vital signs and the nursing notes.  Pertinent labs & imaging results that were available during my care of the patient were reviewed by me and considered in my medical decision making (see chart for details).    MDM Rules/Calculators/A&P                           Patient presenting today for mental health evaluation.  He is awake and alert but seems anxious.  Not providing much history.  Vital signs are reassuring.  Was treated in the last week for wheezing and concern for reactive airways.  Today he will not take deep breaths.  Sats are 97% on room air.  X-ray within the week was negative.  Psych labs are pending.  Patient does not appear to be a threat to himself or others but may be having exacerbation of his mental illness.  He reports he is not taking medications right now.  In the past he has had IM injections of Invega.  He does not know when he last had it.  We will give a dose of Zyprexa.  Feel that patient will need TTS evaluation.  1:17 PM After receiving Zyprexa patient is now feeling more calm.  He is desiring to leave.  At this time he does  not meet IVC criteria.  Labs are all normal and reassuring.  MDM   Amount and/or Complexity of Data Reviewed Clinical lab tests: ordered and reviewed Independent visualization of images, tracings, or specimens: yes     Final Clinical Impression(s) / ED Diagnoses Final diagnoses:  Anxiety  Schizophrenia, unspecified  type [facility])    Rx / DC Orders ED Discharge Orders     None        Gwyneth Sprout, MD 08/20/21 1318

## 2021-08-20 NOTE — ED Triage Notes (Signed)
Pt BIB Law enforcement. Pt states he doesn't know if he is dead or alive. Denies SI or HI. Hx of Schizophrenia  Law enforcement states patient walked into the jail and gave staff his name. Apparently there was a warrant out for his arrest. Patient locked himself in the bathroom at that time. LEO decided he needed to be evaluated

## 2021-08-22 ENCOUNTER — Telehealth (HOSPITAL_COMMUNITY): Payer: Self-pay

## 2021-08-22 NOTE — BH Assessment (Signed)
Care Management - Follow Up [facility] Discharges   Writer attempted to make contact with patient today and was unsuccessful. Patient phone is disconnected.  

## 2021-10-12 DIAGNOSIS — Z5900 Homelessness unspecified: Secondary | ICD-10-CM | POA: Insufficient documentation

## 2021-10-12 DIAGNOSIS — F200 Paranoid schizophrenia: Secondary | ICD-10-CM | POA: Insufficient documentation

## 2021-10-12 DIAGNOSIS — Z560 Unemployment, unspecified: Secondary | ICD-10-CM | POA: Insufficient documentation

## 2021-10-13 ENCOUNTER — Emergency Department (HOSPITAL_COMMUNITY)
Admission: EM | Admit: 2021-10-13 | Discharge: 2021-10-13 | Disposition: A | Discharge status: Home / Self Care | Payer: Medicaid Other | Attending: Emergency Medicine | Admitting: Emergency Medicine

## 2021-10-13 ENCOUNTER — Encounter (HOSPITAL_COMMUNITY): Payer: Self-pay

## 2021-10-13 ENCOUNTER — Ambulatory Visit (HOSPITAL_COMMUNITY)
Admission: EM | Admit: 2021-10-13 | Discharge: 2021-10-13 | Disposition: A | Discharge status: Home / Self Care | Payer: Medicaid Other | Attending: Psychiatry | Admitting: Psychiatry

## 2021-10-13 DIAGNOSIS — F1090 Alcohol use, unspecified, uncomplicated: Secondary | ICD-10-CM | POA: Insufficient documentation

## 2021-10-13 DIAGNOSIS — F200 Paranoid schizophrenia: Secondary | ICD-10-CM

## 2021-10-13 DIAGNOSIS — Z0279 Encounter for issue of other medical certificate: Secondary | ICD-10-CM | POA: Insufficient documentation

## 2021-10-13 DIAGNOSIS — F10920 Alcohol use, unspecified with intoxication, uncomplicated: Secondary | ICD-10-CM

## 2021-10-13 NOTE — ED Notes (Signed)
Pt called x1 in ED lobby. Pt could not be located within ED lobby, and did not respond to name call.   °

## 2021-10-13 NOTE — ED Notes (Signed)
I provided reinforced discharge education based off of discharge instructions. Pt acknowledged and understood my education. Pt had no further questions/concerns for provider/myself.  °

## 2021-10-13 NOTE — ED Provider Notes (Signed)
Behavioral Health Urgent Care Medical Screening Exam  Patient Name: [name] MRN: [id number] Date of Evaluation: 10/13/21 Chief Complaint:  "I'm tired. I'm just cold".  Diagnosis:  Final diagnoses:  Schizophrenia, paranoid ([place])    History of Present illness: [name] is a 31 y.o. male with past psychiatric history significant for paranoid schizophrenia, cannabis use disorder, and malingering who presents to the [facility] behavioral health urgent care [facility]) unaccompanied as a voluntary walk-in for walk-in evaluation.  Patient is well known to our service.  When patient is asked why he came to the [facility], patient states "I just kind of walked in because I'm tired. I'm just cold. I'm alright, but I'm just frustrated. I'm alright. I just need some help".  When patient is asked what he needs help with specifically, patient states "I don't know man. I just need someone to help me out".  When patient is asked again what he would like help with, patient states "I don't know man. I don't know" and does not provide further details.  Patient denies SI currently on exam.  He denies experiencing any SI recently over the past few months.  Patient denies history of any past suicide attempts.  He denies history of any self-injurious behavior via intentionally cutting or burning himself.  He denies HI or AVH.  Patient denies feelings of paranoia.  Patient reports that his sleep has been poor and that he has not slept in the past few days.  When patient is asked why he thinks that he has not been able to sleep for the past few days, patient states "I don't know" and does not provide further details.  Patient denies anhedonia or feelings of guilt, hopelessness, or worthlessness.  He denies energy or concentration changes over the past few months.  Patient reports that he is unsure of if he has had any recent appetite or weight changes over the past few months.  Patient has history of receiving ACTT team  services through [facility] in the past.  Patient reports that he is unsure if he is still linked with his [facility] ACTT team and he cannot recall the last time that he saw his ACTT team.  Thus, patient states that he is unsure if he still has a psychiatrist or therapist at this time.  Patient reports that he is not taking any psychotropic medications at this time.  Patient also states that he does not want to take any psychotropic medications at this time.  Per chart review, patient was last evaluated at [name] on 08/16/21.  Chart review does not reveal evidence of any recent inpatient psychiatric hospitalizations over the past few months.  It has been documented and confirmed in the past that patient does have an apartment in [place] and his aunt is his payee, but not his legal guardian.  However, patient reports that he is currently homeless at this time and he states that he cannot recall the last time that he spoke with his aunt.  Patient reports that he just got out of jail yesterday on 10/12/2021.  When patient is asked what the charges were that he was in jail for, patient states "something small" and does not provide further details.  Patient denies alcohol, tobacco/nicotine, or illicit substance use at this time.  Patient is unemployed at this time.  On exam, patient is sitting upright, appearing well-groomed, in no acute distress.  Eye contact is fair and fleeting.  Speech is clear and coherent with normal rate  and volume.  Mood is euthymic with congruent affect.  Thought process is coherent and goal directed.  Patient is cooperative, but frequently states "I don't know" as answers to questions that are asked during the evaluation.  No indication of patient is responding to internal/external stimuli on exam.  No delusional thought content noted on exam.  Patient declined to give consent to this writer to obtain collateral information from anyone at this time.  Psychiatric Specialty Exam  Presentation   General Appearance:Appropriate for Environment; Well Groomed  Eye Contact:Fair; Engineer, civil (consulting) and Coherent; Normal Rate  Speech Volume:Normal  Handedness:Right   Mood and Affect  Mood:Euthymic  Affect:Congruent   Thought Process  Thought Processes:Coherent; Goal Directed  Descriptions of Associations:Intact  Orientation:Full (Time, Place and Person)  Thought Content:Logical; WDL  Diagnosis of Schizophrenia or Schizoaffective disorder in past: Yes  Duration of Psychotic Symptoms: Greater than six months  Hallucinations:None denies  Ideas of Reference:None  Suicidal Thoughts:No  Homicidal Thoughts:No   Sensorium  Memory:Immediate Fair; Recent Fair; Remote Fair  Judgment:Fair  Insight:Fair   Executive Functions  Concentration:Fair  Attention Span:Fair  [place]  Language:Good   Psychomotor Activity  Psychomotor Activity:Normal   Assets  Assets:Communication Skills; Desire for Improvement; Financial Resources/Insurance; Leisure Time; Physical Health   Sleep  Sleep:Poor  Number of hours: 0 (Patient did not provide details regarding number of hours of sleep.)   No data recorded  Physical Exam: Physical Exam Vitals reviewed.  Constitutional:      General: He is not in acute distress.    Appearance: Normal appearance. He is not ill-appearing, toxic-appearing or diaphoretic.  HENT:     Head: Normocephalic and atraumatic.     Right Ear: External ear normal.     Left Ear: External ear normal.     Nose: Nose normal.  Eyes:     General:        Right eye: No discharge.        Left eye: No discharge.     Conjunctiva/sclera: Conjunctivae normal.  Cardiovascular:     Rate and Rhythm: Normal rate.  Pulmonary:     Effort: Pulmonary effort is normal. No respiratory distress.  Musculoskeletal:        General: Normal range of motion.     Cervical back: Normal range of motion.  Neurological:     General:  No focal deficit present.     Mental Status: He is alert and oriented to person, place, and time.     Comments: No tremor noted.   Psychiatric:        Attention and Perception: He does not perceive auditory or visual hallucinations.        Mood and Affect: Mood and affect normal.        Speech: Speech normal.        Behavior: Behavior is not agitated, slowed, aggressive, withdrawn, hyperactive or combative. Behavior is cooperative.        Thought Content: Thought content is not paranoid or delusional. Thought content does not include homicidal or suicidal ideation.   Review of Systems  Constitutional:  Negative for chills, diaphoresis, fever and malaise/fatigue.       Patient unsure of recent weight changes.  HENT:  Negative for congestion.   Respiratory:  Negative for cough and shortness of breath.   Cardiovascular:  Negative for chest pain and palpitations.  Gastrointestinal:  Negative for abdominal pain, constipation, diarrhea, nausea and vomiting.  Musculoskeletal:  Negative for joint  pain and myalgias.  Neurological:  Negative for dizziness and headaches.  Psychiatric/Behavioral:  Negative for depression, hallucinations, memory loss, substance abuse and suicidal ideas. The patient has insomnia. The patient is not nervous/anxious.   All other systems reviewed and are negative.  Vitals: Blood pressure 121/83, pulse 68, temperature 98.2 F (36.8 C), temperature source Oral, resp. rate 18, SpO2 100 %. There is no height or weight on file to calculate BMI.  Musculoskeletal: Strength & Muscle Tone: within normal limits Gait & Station: normal Patient leans: N/A   [facility] [facility] Discharge Disposition for Follow up and Recommendations: Based on my evaluation the patient does not appear to have an emergency medical condition and can be discharged with resources and follow up care in outpatient services for Medication Management, Individual Therapy, and Group Therapy  Patient denies SI, HI,  AVH, or paranoia on exam.  Patient denies experiencing any SI recently over the past few months.  Patient is not delusional or psychotic on exam.  Thus, patient is not an imminent danger/risk to himself or others at this time and patient does not meet inpatient psychiatric treatment criteria at this time.  Patient states that he is not currently taking any psychotropic medications at this time and he states that he is not sure if he is still currently living with his [facility] ACTT team at this time.  Patient also reports that he does not want to take psychotropic medications at this time. It is difficult to determine if patient is currently receiving ACTT team services at this time due to patient's refusal for this provider to obtain collateral information.  Recommend that patient follow up with his ACTT team by calling them in order to inquire/follow-up about his current status with his ACTT team regarding psychotropic medication management and/or therapy services.  Contact information for [name] provided to patient in patient's after visit summary.  Patient is a [facility] resident and does have [employer].  Thus, patient appears to be a candidate for [facility] outpatient open access/walk-in hours.  If patient is unable to continue his [facility] ACTT team services, recommend that the patient attend [facility] outpatient walk-in hours for psychiatry/medication management and/or therapy services.  [facility] walk-in hour schedule provided to patient.   Safety planning done at length with the patient regarding appropriate actions to take/resources to utilize [facility], nearest ED, 911, suicide prevention Lifeline) if the patient becomes suicidal or homicidal, if the patient's condition rapidly deteriorates/worsen/does not improve, or if the patient begins to experience a mental health crisis.  All  patient's questions answered and concerns addressed.  Patient discharged and provided with bus pass upon discharge.   Prescilla Sours, PA-C 10/13/2021, 5:00 AM

## 2021-10-13 NOTE — Discharge Instructions (Addendum)
[facility] ([address] [place], [place] [address], [phone]) 2nd floor Outpatient medication management/therapy walk-in hours:  Discharge recommendations:  Patient is to take medications as prescribed. Please see information for follow-up appointment with psychiatry and therapy. Please follow up with your primary care provider for all medical related needs.   Therapy: We recommend that patient participate in individual therapy to address mental health concerns.  Medications: The patient is to contact a medical professional and/or outpatient provider to address any new side effects that develop. Patient should update outpatient providers of any new medications and/or medication changes.   Atypical antipsychotics: If you are prescribed an atypical antipsychotic, it is recommended that your height, weight, BMI, blood pressure, fasting lipid panel, and fasting blood sugar be monitored by your outpatient providers.  Safety:  The patient should abstain from use of illicit substances/drugs and abuse of any medications. If symptoms worsen or do not continue to improve or if the patient becomes actively suicidal or homicidal then it is recommended that the patient return to the closest hospital emergency department, the [facility], or call 911 for further evaluation and treatment. National Suicide Prevention Lifeline 1-800-SUICIDE or 619-208-5888.  About 988 988 offers 24/7 access to trained crisis counselors who can help people experiencing mental health-related distress. People can call or text 988 or chat 988lifeline.org for themselves or if they are worried about a loved one who may need crisis support.

## 2021-10-13 NOTE — ED Provider Notes (Signed)
[facility]-EMERG[facility] DEPT Provider Note   CSN: [id number] Arrival date & time: 10/13/21  0502     History  Chief Complaint  Patient presents with   Medical Clearance    [name] is a 31 y.o. male.  31 year old male who presents complaining of trouble seeing.  Patient admits to drinking alcohol.  Patient was sleeping when aroused states he feels much better at this time.  Denies SI or HI.  Patient just had a full psychiatric evaluation at the behavioral urgent care center just prior to arrival.  Has had 40 ED visits in the past 6 months with various complaints.  He is currently back at baseline      Home Medications Prior to Admission medications   Medication Sig Start Date End Date Taking? Authorizing Provider  Paliperidone Palmitate (INVEGA TRINZA IM) Inject into the muscle every 3 (three) months. Patient not taking: Reported on 08/20/2021    [provider]  paliperidone (INVEGA) 6 MG 24 hr tablet Take 1 tablet (6 mg total) by mouth daily for 14 days. Patient not taking: No sig reported 01/02/21 02/22/21  Mare Ferrari, PA-C      Allergies    Shellfish allergy and Bee pollen    Review of Systems   Review of Systems  All other systems reviewed and are negative.  Physical Exam Updated Vital Signs BP 104/76    Pulse 67    Temp 98.6 F (37 C) (Oral)    Resp 16    Ht 1.778 m (5\' 10" )    Wt 72.6 kg    SpO2 99%    BMI 22.96 kg/m  Physical Exam Vitals and nursing note reviewed.  Constitutional:      General: He is not in acute distress.    Appearance: Normal appearance. He is well-developed. He is not toxic-appearing.  HENT:     Head: Normocephalic and atraumatic.  Eyes:     General: Lids are normal.     Conjunctiva/sclera: Conjunctivae normal.     Pupils: Pupils are equal, round, and reactive to light.  Neck:     Thyroid: No thyroid mass.     Trachea: No tracheal deviation.  Cardiovascular:     Rate and Rhythm: Normal rate and  regular rhythm.     Heart sounds: Normal heart sounds. No murmur heard.   No gallop.  Pulmonary:     Effort: Pulmonary effort is normal. No respiratory distress.     Breath sounds: Normal breath sounds. No stridor. No decreased breath sounds, wheezing, rhonchi or rales.  Abdominal:     General: There is no distension.     Palpations: Abdomen is soft.     Tenderness: There is no abdominal tenderness. There is no rebound.  Musculoskeletal:        General: No tenderness. Normal range of motion.     Cervical back: Normal range of motion and neck supple.  Skin:    General: Skin is warm and dry.     Findings: No abrasion or rash.  Neurological:     Mental Status: He is alert and oriented to person, place, and time. Mental status is at baseline.     GCS: GCS eye subscore is 4. GCS verbal subscore is 5. GCS motor subscore is 6.     Cranial Nerves: No cranial nerve deficit.     Sensory: No sensory deficit.     Motor: Motor function is intact.  Psychiatric:  Attention and Perception: Attention normal.        Speech: Speech normal.        Behavior: Behavior normal.    ED Results / Procedures / Treatments   Labs (all labs ordered are listed, but only abnormal results are displayed) Labs Reviewed - No data to display  EKG None  Radiology No results found.  Procedures Procedures    Medications Ordered in ED Medications - No data to display  ED Course/ Medical Decision Making/ A&P                           Medical Decision Making  Patient without acute complaints at this time.  Did admit to drinking alcohol and he now appears clinically sober.  Will discharge.  Does not require any current evaluation from psychiatry or medical.  Has stable vital signs.        Final Clinical Impression(s) / ED Diagnoses Final diagnoses:  None    Rx / DC Orders ED Discharge Orders     None         Lorre Nick, MD 10/13/21 1027

## 2021-10-13 NOTE — ED Triage Notes (Signed)
Patient arrived with EMS with complaints of "trouble seeing" with no further explanation  Patient Dcd from [facility] earlier this morning and returned to [facility] requesting admission. Declines any SI or HI at this time.

## 2021-10-13 NOTE — ED Notes (Signed)
Pt refused to take discharge paperwork/instructions.

## 2021-10-13 NOTE — Progress Notes (Signed)
Pt denies any SI, HI or A/V hallucinations..  He says "I don't know" to most questions.  He complains of being cold and having racing thoughts.  Pt denies having any current ACTT team services.  He says he has not been in touch with his aunt.  Pt ends interview saying "I don't need anything.[name]"

## 2021-10-14 ENCOUNTER — Encounter (HOSPITAL_COMMUNITY): Payer: Self-pay

## 2021-10-14 ENCOUNTER — Emergency Department (HOSPITAL_COMMUNITY)
Admission: EM | Admit: 2021-10-14 | Discharge: 2021-10-14 | Disposition: A | Discharge status: Home / Self Care | Payer: Medicaid Other | Attending: Emergency Medicine | Admitting: Emergency Medicine

## 2021-10-14 DIAGNOSIS — Z79899 Other long term (current) drug therapy: Secondary | ICD-10-CM | POA: Insufficient documentation

## 2021-10-14 DIAGNOSIS — F209 Schizophrenia, unspecified: Secondary | ICD-10-CM

## 2021-10-14 DIAGNOSIS — Z20822 Contact with and (suspected) exposure to covid-19: Secondary | ICD-10-CM | POA: Insufficient documentation

## 2021-10-14 LAB — CBC WITH DIFFERENTIAL/PLATELET
Abs Immature Granulocytes: 0.01 10*3/uL (ref 0.00–0.07)
Basophils Absolute: 0 10*3/uL (ref 0.0–0.1)
Basophils Relative: 1 %
Eosinophils Absolute: 0.3 10*3/uL (ref 0.0–0.5)
Eosinophils Relative: 5 %
HCT: 44.4 % (ref 39.0–52.0)
Hemoglobin: 15.3 g/dL (ref 13.0–17.0)
Immature Granulocytes: 0 %
Lymphocytes Relative: 43 %
Lymphs Abs: 2.9 10*3/uL (ref 0.7–4.0)
MCH: 30.8 pg (ref 26.0–34.0)
MCHC: 34.5 g/dL (ref 30.0–36.0)
MCV: 89.3 fL (ref 80.0–100.0)
Monocytes Absolute: 0.5 10*3/uL (ref 0.1–1.0)
Monocytes Relative: 7 %
Neutro Abs: 2.9 10*3/uL (ref 1.7–7.7)
Neutrophils Relative %: 44 %
Platelets: 220 10*3/uL (ref 150–400)
RBC: 4.97 MIL/uL (ref 4.22–5.81)
RDW: 12.5 % (ref 11.5–15.5)
WBC: 6.6 10*3/uL (ref 4.0–10.5)
nRBC: 0 % (ref 0.0–0.2)

## 2021-10-14 LAB — RAPID URINE DRUG SCREEN, HOSP PERFORMED
Amphetamines: NOT DETECTED
Barbiturates: NOT DETECTED
Benzodiazepines: NOT DETECTED
Cocaine: NOT DETECTED
Opiates: NOT DETECTED
Tetrahydrocannabinol: NOT DETECTED

## 2021-10-14 LAB — COMPREHENSIVE METABOLIC PANEL
ALT: 12 U/L (ref 0–44)
AST: 28 U/L (ref 15–41)
Albumin: 4.2 g/dL (ref 3.5–5.0)
Alkaline Phosphatase: 41 U/L (ref 38–126)
Anion gap: 9 (ref 5–15)
BUN: 13 mg/dL (ref 6–20)
CO2: 24 mmol/L (ref 22–32)
Calcium: 9.1 mg/dL (ref 8.9–10.3)
Chloride: 103 mmol/L (ref 98–111)
Creatinine, Ser: 0.93 mg/dL (ref 0.61–1.24)
GFR, Estimated: >60 mL/min (ref >60)
Glucose, Bld: 90 mg/dL (ref 70–99)
Potassium: 3.7 mmol/L (ref 3.5–5.1)
Sodium: 136 mmol/L (ref 135–145)
Total Bilirubin: 0.5 mg/dL (ref 0.3–1.2)
Total Protein: 6.7 g/dL (ref 6.5–8.1)

## 2021-10-14 LAB — RESP PANEL BY RT-PCR (FLU A&B, COVID) ARPGX2
Influenza A by PCR: NEGATIVE
Influenza B by PCR: NEGATIVE
SARS Coronavirus 2 by RT PCR: NEGATIVE

## 2021-10-14 LAB — ETHANOL: Alcohol, Ethyl (B): <10 mg/dL (ref <10)

## 2021-10-14 MED ORDER — NICOTINE 21 MG/24HR TD PT24: 21.0000 mg | MEDICATED_PATCH | Freq: Every day | TRANSDERMAL | Status: DC

## 2021-10-14 NOTE — ED Triage Notes (Signed)
Patient BIBEMS, Patient states he can't breath or see. Patient also states he "has a lot going on up here" while gesturing to head.  VS with EMS BP: 132/74 HR 62 SPO2: 100%

## 2021-10-14 NOTE — ED Provider Notes (Addendum)
[name] EMERGENCY DEPARTMENT Provider Note   CSN: [id number] Arrival date & time: 10/14/21  0525     History  Chief Complaint  Patient presents with   Mental Health Problem    [name] is a 31 y.o. male.  Pt is a 31 yo bm with a hx of schizophrenia.  Pt came to the ED today with complaints that he was not breathing and that he had a lot going on in his head.  He did not want to say any more.  He has been evaluated by [facility] several times in the past.  He was seen at [facility] and at [facility] yesterday and was sent home.  He is not taking his meds.      Home Medications Prior to Admission medications   Medication Sig Start Date End Date Taking? Authorizing Provider  Paliperidone Palmitate (INVEGA TRINZA IM) Inject into the muscle every 3 (three) months. Patient not taking: Reported on 08/20/2021    [provider]  paliperidone (INVEGA) 6 MG 24 hr tablet Take 1 tablet (6 mg total) by mouth daily for 14 days. Patient not taking: No sig reported 01/02/21 02/22/21  Mare Ferrari, PA-C      Allergies    Shellfish allergy and Bee pollen    Review of Systems   Review of Systems  Psychiatric/Behavioral:  Positive for hallucinations.   All other systems reviewed and are negative.  Physical Exam Updated Vital Signs BP (!) 122/94    Pulse 66    Temp 97.8 F (36.6 C) (Oral)    Resp 18    SpO2 100%  Physical Exam Vitals and nursing note reviewed.  Constitutional:      Appearance: Normal appearance.  HENT:     Head: Normocephalic and atraumatic.     Right Ear: External ear normal.     Left Ear: External ear normal.     Nose: Nose normal.     Mouth/Throat:     Mouth: Mucous membranes are moist.     Pharynx: Oropharynx is clear.  Eyes:     Extraocular Movements: Extraocular movements intact.     Conjunctiva/sclera: Conjunctivae normal.     Pupils: Pupils are equal, round, and reactive to light.  Cardiovascular:     Rate and Rhythm: Normal rate and  regular rhythm.     Pulses: Normal pulses.     Heart sounds: Normal heart sounds.  Pulmonary:     Effort: Pulmonary effort is normal.     Breath sounds: Normal breath sounds.  Abdominal:     General: Abdomen is flat. Bowel sounds are normal.     Palpations: Abdomen is soft.  Musculoskeletal:        General: Normal range of motion.     Cervical back: Normal range of motion and neck supple.  Skin:    General: Skin is warm.     Capillary Refill: Capillary refill takes less than 2 seconds.  Neurological:     General: No focal deficit present.     Mental Status: He is alert and oriented to person, place, and time.  Psychiatric:        Mood and Affect: Mood normal.        Behavior: Behavior normal.    ED Results / Procedures / Treatments   Labs (all labs ordered are listed, but only abnormal results are displayed) Labs Reviewed  RESP PANEL BY RT-PCR (FLU A&B, COVID) [id number]  COMPREHENSIVE METABOLIC PANEL  ETHANOL  CBC  WITH DIFFERENTIAL/PLATELET  RAPID URINE DRUG SCREEN, HOSP PERFORMED    EKG None  Radiology No results found.  Procedures Procedures    Medications Ordered in ED Medications  nicotine (NICODERM CQ - dosed in mg/24 hours) patch 21 mg (has no administration in time range)    ED Course/ Medical Decision Making/ A&P                           Medical Decision Making  I reassured pt that he is breathing.  Labs reviewed and are unremarkable.  He is medically clear for TTS eval.   TTS completed.  Pt does not meet criteria for inpatient treatment.  He is psych cleared for d/c.  He is to f/u with his ACTT provider.   Final Clinical Impression(s) / ED Diagnoses Final diagnoses:  Schizophrenia, unspecified type [facility])    Rx / DC Orders ED Discharge Orders     None         Jacalyn Lefevre, MD 10/14/21 Ebony Cargo    Jacalyn Lefevre, MD 10/14/21 2311

## 2021-10-14 NOTE — ED Notes (Signed)
Called patient several times patient didn't answer  

## 2021-10-14 NOTE — BH Assessment (Signed)
Comprehensive Clinical Assessment (CCA) Note  10/14/2021 Roe [id number]  DISPOSITION: Gave clinical report to Trinna Post, PA who determined Pt does not meet criteria for inpatient psychiatric treatment and recommends Pt follow up with his current provider, [name]. Notified Dr. Isla Pence and Lin Landsman, RN of recommendation.  The patient demonstrates the following risk factors for suicide: Chronic risk factors for suicide include: psychiatric disorder of schizoaffective disorder . Acute risk factors for suicide include: social withdrawal/isolation. Protective factors for this patient include: positive therapeutic relationship. Considering these factors, the overall suicide risk at this point appears to be low. Patient is appropriate for outpatient follow up.  [place] ED from 10/14/2021 in [place] ED from 10/13/2021 in [place] DEPT Pre-admit (Canceled) from 02/22/2021 in [place] CATEGORY No Risk No Risk No Risk      Pt is a 31 year old single male who presents unaccompanied to [facility] ED reporting that he was not breathing and that he had a lot going on in his head. Pt has diagnosis of schizophrenia and a history of multiple presentations to emergency services-- Pt's was evaluated and discharged from both [facility] and [facility] yesterday. During TTS assessment, when asked why Pt came to [facility], Pt replies "I don't know." He denies that he is having any problems breathing. Pt's describes his mood today as "okay." He denies current suicidal ideation. He denies current homicidal ideation. He denies experiencing auditory or visual hallucinations. He denies use of alcohol or other substances today. He denies problems with sleep or appetite. Pt is minimally cooperative during assessment, responding with single word responses and repeated stating "I'm okay."  Pt has  history of receiving ACTT services through [facility] in the past.  He cannot recall the last time that he saw his ACTT team.  He reports that he is not taking any psychotropic medications. Pt reports he was released from jail on 10/12/2020 and does not have a place to stay. Pt's aunt is his payee and he had a residence in [place] [place] but frequently refused to stay there.  Pt is cover by a hospital blanket, alert and oriented x4. Pt speaks in a clear tone, at moderate volume and normal pace. Motor behavior appears normal. Eye contact is fair. Pt's mood is euthymic and affect is congruent with mood. Thought process is coherent and relevant. There is no indication Pt is currently responding to internal stimuli or experiencing delusional thought content. Pt was minimally cooperative during assessment. When asked if there was any assistance Pt needed regarding his mental health, Pt replied "No." When asked if he felt safe being discharged from the ED, Pt states "I'm okay."    Chief Complaint:  Chief Complaint  Patient presents with   Mental Health Problem   Visit Diagnosis: F20.9 Schizophrenia   CCA Screening, Triage and Referral (STR)  Patient Reported Information How did you hear about [place]? Self  Referral name: GPD/self  Referral phone number: 0 (N/A)   Whom do you see for routine medical problems? I don't have a doctor  Practice/Facility Name: No data recorded Practice/Facility Phone Number: No data recorded Name of Contact: No data recorded Contact Number: No data recorded Contact Fax Number: No data recorded Prescriber Name: No data recorded Prescriber Address (if known): No data recorded  What Is the Reason for Your Visit/Call Today? Pt presented to [facility] ED tonight and reported to triage that he was not  breathing well and was having mental problems. During assessment Pt denies breathing problems. He denies suicidal ideation, homicidal ideation, A/V hallucinations, or substance  use. Pt give brief answer to questions and repeatedly says "I'm alright."  How Long Has This Been Causing You Problems? 1 wk - 1 month  What Do You Feel Would Help You the Most Today? Treatment for Depression or other mood problem   Have You Recently Been in Any Inpatient Treatment (Hospital/Detox/Crisis Center/28-Day Program)? No  Name/Location of Program/Hospital:No data recorded How Long Were You There? No data recorded When Were You Discharged? No data recorded  Have You Ever Received Services From [facility] Before? Yes  Who Do You See at [facility]? Pt has previous ED, [facility] and [facility] visits.   Have You Recently Had Any Thoughts About Hurting Yourself? No  Are You Planning to Commit Suicide/Harm Yourself At This time? No   Have you Recently Had Thoughts About [place]? No  Explanation: No data recorded  Have You Used Any Alcohol or Drugs in the Past 24 Hours? No  How Long Ago Did You Use Drugs or Alcohol? 0000 (Unclear)  What Did You Use and How Much? "I don't know"   Do You Currently Have a Therapist/Psychiatrist? No  Name of Therapist/Psychiatrist: ACT Team   Have You Been Recently Discharged From Any Office Practice or Programs? Yes  Explanation of Discharge From Practice/Program: Discharged from [facility] yesterday. Multiple visits to emergency services.     CCA Screening Triage Referral Assessment Type of Contact: Tele-Assessment  Is this Initial or Reassessment? Initial Assessment  Date Telepsych consult ordered in [facility]:  10/14/21  Time Telepsych consult ordered in [facility]:  1859   Patient Reported Information Reviewed? Yes  Patient Left Without Being Seen? No  Reason for Not Completing Assessment: No data recorded  Collateral Involvement: Medical record   Does Patient Have a [place]? No data recorded Name and Contact of Legal Guardian: No data recorded If Minor and Not Living with Parent(s), Who has  Custody? NA  Is CPS involved or ever been involved? Never  Is APS involved or ever been involved? Never   Patient Determined To Be At Risk for Harm To Self or Others Based on Review of Patient Reported Information or Presenting Complaint? No  Method: No Plan  Availability of Means: No access or NA  Intent: Vague intent or NA  Notification Required: No need or identified person  Additional Information for Danger to Others Potential: Active psychosis  Additional Comments for Danger to Others Potential: History of children playing outside triggering patient.  Are There Guns or Other Weapons in [place]? No  Types of Guns/Weapons: No data recorded Are These Weapons Safely Secured?                            No data recorded Who Could Verify You Are Able To Have These Secured: No data recorded Do You Have any Outstanding Charges, Pending Court Dates, Parole/Probation? None noted  Contacted To Inform of Risk of Harm To Self or Others: Other: Comment (NA)   Location of Assessment: [facility] ED   Does Patient Present under Involuntary Commitment? No  IVC Papers Initial File Date: 10/22/20   [place] of Residence: [place]   Patient Currently Receiving the Following Services: ACTT Architect)   Determination of Need: Routine (7 days)   Options For Referral: Outpatient Therapy; Medication Management  CCA Biopsychosocial Intake/Chief Complaint:  Patient stated he is "scared for his life." He further explained that he is "scared of life."  Patient also stated "I can't hardly breath."  Current Symptoms/Problems: Pt is wheezing. Patient denies SI, HI, NSSH and AVH, Patient does not appear intoxicated, delusional or responding to internal stimuli.   Patient Reported Schizophrenia/Schizoaffective Diagnosis in Past: Yes   Strengths: Pt is linked to [facility] for ACTT services.  Preferences: Not assessed.  Abilities: Not assessed.   Type of Services  Patient Feels are Needed: Pt states he does not know   Initial Clinical Notes/Concerns: None noted   Mental Health Symptoms Depression:   None   Duration of Depressive symptoms:  N/A   Mania:   None   Anxiety:    Worrying; Restlessness   Psychosis:   None   Duration of Psychotic symptoms:  Greater than six months   Trauma:   None   Obsessions:   None   Compulsions:   None   Inattention:   N/A   Hyperactivity/Impulsivity:   N/A   Oppositional/Defiant Behaviors:   N/A   Emotional Irregularity:   N/A   Other Mood/Personality Symptoms:   None noted    Mental Status Exam Appearance and self-care  Stature:   Average   Weight:   Average weight   Clothing:   Age-appropriate; Casual   Grooming:   Normal   Cosmetic use:   None   Posture/gait:   Normal   Motor activity:   Not Remarkable   Sensorium  Attention:   Normal   Concentration:   Normal   Orientation:   X5   Recall/memory:   Normal   Affect and Mood  Affect:   Blunted   Mood:   Euthymic   Relating  Eye contact:   Fleeting   Facial expression:   Constricted   Attitude toward examiner:   Cooperative; Uninterested   Thought and Language  Speech flow:  Paucity   Thought content:   Appropriate to Mood and Circumstances   Preoccupation:   None   Hallucinations:   None   Organization:  No data recorded  [employer] of Knowledge:   Poor   Intelligence:   Needs investigation   Abstraction:   Concrete   Judgement:   Poor   Reality Testing:   Adequate   Insight:   Poor   Decision Making:   Impulsive   Social Functioning  Social Maturity:   Impulsive   Social Judgement:   Heedless   Stress  Stressors:   Housing; Teacher, music Ability:   Deficient supports   Skill Deficits:   Training and development officer; Self-care; Self-control   Supports:   Support needed     Religion: Religion/Spirituality Are You  A Religious Person?: No  Leisure/Recreation: Leisure / Recreation Do You Have Hobbies?: Yes Leisure and Hobbies: Football  Exercise/Diet: Exercise/Diet Do You Exercise?: No Have You Gained or Lost A Significant Amount of Weight in the Past Six Months?: No Do You Follow a Special Diet?: No Do You Have Any Trouble Sleeping?: No   CCA Employment/Education Employment/Work Situation: Employment / Work Situation Employment Situation: On disability Why is Patient on Disability: Mental health How Long has Patient Been on Disability: UTA Has Patient ever Been in the [employer]?: No  Education: Education Is Patient Currently Attending School?: No Last Grade Completed: 9 Did You Attend College?: No Did You Have An Individualized Education Program (IIEP): No Did You Have  Any Difficulty At School?: No Patient's Education Has Been Impacted by Current Illness: No   CCA Family/Childhood History Family and Relationship History: Family history Marital status: Single Does patient have children?: No  Childhood History:  Childhood History By whom was/is the patient raised?: Other (Comment) (Not assessed) Did patient suffer any verbal/emotional/physical/sexual abuse as a child?: No Did patient suffer from severe childhood neglect?: No Has patient ever been sexually abused/assaulted/raped as an adolescent or adult?: No Was the patient ever a victim of a crime or a disaster?: No Witnessed domestic violence?: No Has patient been affected by domestic violence as an adult?: No  Child/Adolescent Assessment:     CCA Substance Use Alcohol/Drug Use: Alcohol / Drug Use Pain Medications: See MAR Prescriptions: See MAR Over the Counter: See MAR History of alcohol / drug use?: Yes Longest period of sobriety (when/how long): Unknown                         ASAM's:  Six Dimensions of Multidimensional Assessment  Dimension 1:  Acute Intoxication and/or Withdrawal Potential:    Dimension 1:  Description of individual's past and current experiences of substance use and withdrawal: Pt reports that he use to smoke marijuana.  Dimension 2:  Biomedical Conditions and Complications:   Dimension 2:  Description of patient's biomedical conditions and  complications: Pt did not report medical  Dimension 3:  Emotional, Behavioral, or Cognitive Conditions and Complications:  Dimension 3:  Description of emotional, behavioral, or cognitive conditions and complications: Schizophrenia, Paranoid  Dimension 4:  Readiness to Change:  Dimension 4:  Description of Readiness to Change criteria: Pt reports that he is not taking medicaiton  Dimension 5:  Relapse, Continued use, or Continued Problem Potential:  Dimension 5:  Relapse, continued use, or continued problem potential critiera description: Precontemplation  Dimension 6:  Recovery/Living Environment:  Dimension 6:  Recovery/Iiving environment criteria description: Pt reports that he is homeless  ASAM Severity Score: ASAM's Severity Rating Score: 9  ASAM Recommended Level of Treatment: ASAM Recommended Level of Treatment: Level I Outpatient Treatment   Substance use Disorder (SUD) Substance Use Disorder (SUD)  Checklist Symptoms of Substance Use: Continued use despite having a persistent/recurrent physical/psychological problem caused/exacerbated by use, Continued use despite persistent or recurrent social, interpersonal problems, caused or exacerbated by use  Recommendations for Services/Supports/Treatments: Recommendations for Services/Supports/Treatments Recommendations For Services/Supports/Treatments: ACCTT Designer, fashion/clothing Treatment)  DSM5 Diagnoses: Patient Active Problem List   Diagnosis Date Noted   Malingering 05/24/2021   Homelessness 05/24/2021   Alcohol abuse 03/21/2021   Substance-induced disorder ([place]) 01/02/2021   Schizophrenia, paranoid type ([place]) 10/24/2020   Paranoid schizophrenia ([place]) 01/05/2020    Other schizophrenia ([place]) 02/14/2018   Cannabis use disorder, moderate, dependence ([place]) 05/10/2016    Patient Centered Plan: Patient is on the following Treatment Plan(s):  Anxiety   Referrals to Alternative Service(s): Referred to Alternative Service(s):   Place:   Date:   Time:    Referred to Alternative Service(s):   Place:   Date:   Time:    Referred to Alternative Service(s):   Place:   Date:   Time:    Referred to Alternative Service(s):   Place:   Date:   Time:     Evelena Peat, [facility]

## 2021-10-14 NOTE — Discharge Instructions (Signed)
Follow you with your ACTT team.

## 2021-10-14 NOTE — ED Provider Triage Note (Signed)
Emergency Medicine Provider Triage Evaluation Note  Ezekio Hayton , a 31 y.o. male  was evaluated in triage.  Pt complains of "not being able to see."  When asked how long this was going on for patient states "I do not know."    Patient denies any suicidal ideations, homicidal ideations, auditory hallucinations, or visual hallucinations  When asked if he has used any alcohol or drugs patient states "I do not know."  When asked if he takes any medications he states "I do not know."  Per chart review patient has history significant for paranoid schizophrenia, epidural use disorder and malingering.    Review of Systems  Positive: Visual disturbance Negative: SI, HI, AVH, headache, numbness, weakness  Physical Exam  BP 138/78 (BP Location: Right Arm)    Pulse 69    Temp 97.8 F (36.6 C) (Oral)    Resp 17    SpO2 99%  Gen:   Awake, no distress   Resp:  Normal effort  MSK:   Moves extremities without difficulty  Other:  Patient actively tracks provider around the room however when asked to follow-up problems for EOM states that he cannot.  Pupils PERRL.  Medical Decision Making  Medically screening exam initiated at 5:34 AM.  Appropriate orders placed.  Antonios Ek was informed that the remainder of the evaluation will be completed by another provider, this initial triage assessment does not replace that evaluation, and the importance of remaining in the ED until their evaluation is complete.     Loni Beckwith, [place] 10/14/21 [id number]

## 2021-10-15 ENCOUNTER — Ambulatory Visit (HOSPITAL_COMMUNITY)
Admission: EM | Admit: 2021-10-15 | Discharge: 2021-10-15 | Disposition: A | Discharge status: Home / Self Care | Payer: Medicaid Other | Attending: Psychiatry | Admitting: Psychiatry

## 2021-10-15 DIAGNOSIS — F200 Paranoid schizophrenia: Secondary | ICD-10-CM | POA: Insufficient documentation

## 2021-10-15 DIAGNOSIS — Z765 Malingerer [conscious simulation]: Secondary | ICD-10-CM | POA: Insufficient documentation

## 2021-10-15 DIAGNOSIS — F1290 Cannabis use, unspecified, uncomplicated: Secondary | ICD-10-CM | POA: Insufficient documentation

## 2021-10-15 NOTE — Progress Notes (Signed)
°   10/15/21 0807  [facility] Triage Screening (Walk-ins at [facility] only)  What Is the Reason for Your Visit/Call Today? 31 year old present to [facility] with rambling. He denied suicidal/homicidal ideations and denied auditory/visual hallucinations. Patient has a history of schizophrenia and was seen in the ED at [facility] on 10/13/2021 where he was discharged. Patient expressing he's cold, it's cold outside, he's locked out his home est..Marland KitchenMarland KitchenHe did not state anything to report that he was in a crisis.  How Long Has This Been Causing You Problems? <Week  Have You Recently Had Any Thoughts About Hurting Yourself? No  Are You Planning to Commit Suicide/Harm Yourself At This time? No  Have you Recently Had Thoughts About Hurting Someone [name]? No  Are You Planning To Harm Someone At This Time? No  Are you currently experiencing any auditory, visual or other hallucinations? No  Have You Used Any Alcohol or Drugs in the Past 24 Hours? No  How long ago did you use Drugs or Alcohol? Patient was released from jail on 10/12/2021 per chart review 3 days ago  Do you have any current medical co-morbidities that require immediate attention? No  What Do You Feel Would Help You the Most Today? Treatment for Depression or other mood problem  If access to [facility] was not available, would you have sought care in the Emergency Department? Yes  Determination of Need Routine (7 days)  Options For Referral Outpatient Therapy;Medication Management

## 2021-10-15 NOTE — ED Provider Notes (Signed)
Behavioral Health Urgent Care Medical Screening Exam  Patient Name: [name] MRN: [id number] Date of Evaluation: 10/15/21 Chief Complaint:   Diagnosis:  Final diagnoses:  None    History of Present illness: [name] is a 31 y.o. male who presents to the [facility]  unaccompanied as a voluntary walk-in for walk-in evaluation. Patient was previously seen by TTS at [facility] on 10/14/21 PM and psych cleared.   Patient has a past psychiatric history significant for paranoid schizophrenia, cannabis use disorder, and malingering  Patient is well known to the [employer] service.  On evaluation, patient is lying down asleep in no acute distress. He awakens to participate in the assessment. He is alert and oriented x3. His thought process is coherent and at baseline. His speech is clear and coherent with good eye contact. He is casually dressed. He is calm and cooperative.   When asked what brought him in to be evaluated, he states I do not know. I am sleepy. I have no place to go. It is cold outside." He then states, "I am scared. No. I am not scared."  When asked if he is experiencing auditory and visual hallucinations, he states "no I am not." There is no evidence that he is responding to internal or external stimuli. When asked if he is having thoughts of wanting to kill himself or others, he states, "no." He reports good sleep. He reports a good appetite. He states that he does not know if he has been on his medications. He states that he has not followed up with the ACT team. When asked if he still lives in [place], he states, "I don't like it there."  He expresses to this provider that he does not know why his grandmother will not let him back in the house, he states, "I promise I will not leave if she lets me back in. I will stay in the house."  When asked if he knows his grandmother's telephone number so this provider can contact his grandmother, he  states no. Per chart review this provider was able to obtain his aunt's [name] contact information [phone]. Patient gives verbal consent for this provider to contact his aunt Ms. [name] for collateral. This provider contacted Ms. [name] along with the patient present, no answer, unable to leave voicemail. Patient asked for a snack and was provided with a snack and a cup of water.  Patient does not meet IVC criteria and will be discharged at this time with outpatient resources. Patient advised to follow up with the ACT team for outpatient medication management. He was provided with the contact number to schedule an appointment. Patient given additional outpatient resources for medication management. Patient provided with a bus pass for transportation home.   Psychiatric Specialty Exam  Presentation  General Appearance:Appropriate for Environment  Eye Contact:Fair  Speech:Clear and Coherent  Speech Volume:Normal  Handedness:Right   Mood and Affect  Mood:Euthymic  Affect:Appropriate   Thought Process  Thought Processes:Coherent  Descriptions of Associations:Intact  Orientation:Full (Time, Place and Person)  Thought Content:Logical  Diagnosis of Schizophrenia or Schizoaffective disorder in past: Yes  Duration of Psychotic Symptoms: Greater than six months  Hallucinations:None denies  Ideas of Reference:None  Suicidal Thoughts:No  Homicidal Thoughts:No   Sensorium  Memory:Immediate Fair; Remote Fair; Recent Fair  Judgment:Fair  Insight:Fair   Executive Functions  Concentration:Fair  Attention Span:Fair  Recall:Fair  Fund of Knowledge:Fair  Language:Good   Psychomotor Activity  Psychomotor  Activity:Normal   Assets  Assets:Communication Skills; Desire for Improvement; Financial Resources/Insurance; Housing; Leisure Time; Physical Health; Social Support   Sleep  Sleep:Poor  Number of hours: 0 (Patient did not provide details regarding number  of hours of sleep.)   No data recorded  Physical Exam: Physical Exam Constitutional:      Appearance: Normal appearance.  HENT:     Head: Normocephalic.     Nose: Nose normal.  Eyes:     Conjunctiva/sclera: Conjunctivae normal.  Cardiovascular:     Rate and Rhythm: Normal rate.  Pulmonary:     Effort: Pulmonary effort is normal.  Musculoskeletal:     Cervical back: Normal range of motion.  Neurological:     Mental Status: He is alert.   Review of Systems  Constitutional: Negative.   HENT: Negative.    Eyes: Negative.   Respiratory:  Positive for cough.   Cardiovascular: Negative.   Gastrointestinal: Negative.   Genitourinary: Negative.   Musculoskeletal: Negative.   Skin: Negative.   Neurological: Negative.   Endo/Heme/Allergies: Negative.   Blood pressure 109/71, pulse 66, temperature 97.7 F (36.5 C), temperature source Oral, resp. rate 14, SpO2 100 %. There is no height or weight on file to calculate BMI.  Musculoskeletal: Strength & Muscle Tone: within normal limits Gait & Station: normal Patient leans: N/A   [facility] MSE Discharge Disposition for Follow up and Recommendations: Based on my evaluation the patient does not appear to have an emergency medical condition and can be discharged with resources and follow up care in outpatient services for Medication Management, Individual Therapy, and Group Therapy   Layla Barter, NP 10/15/2021, 7:56 AM

## 2021-10-15 NOTE — Discharge Instructions (Addendum)
Discharge recommendations:  Patient is to take medications as prescribed. Please see information for follow-up appointment with psychiatry and therapy. Call [phone] to follow up with ACT services for medication management. Please follow up with your primary care provider for all medical related needs.   Therapy: We recommend that patient participate in individual therapy to address mental health concerns.  Medications: The parent/guardian is to contact a medical professional and/or outpatient provider to address any new side effects that develop. Parent/guardian should update outpatient providers of any new medications and/or medication changes.   Atypical antipsychotics: If you are prescribed an atypical antipsychotic, it is recommended that your height, weight, BMI, blood pressure, fasting lipid panel, and fasting blood sugar be monitored by your outpatient providers.  Safety:  The patient should abstain from use of illicit substances/drugs and abuse of any medications. If symptoms worsen or do not continue to improve or if the patient becomes actively suicidal or homicidal then it is recommended that the patient return to the closest hospital emergency department, the [facility], or call 911 for further evaluation and treatment. National Suicide Prevention Lifeline 1-800-SUICIDE or 7075488478.  About 988 988 offers 24/7 access to trained crisis counselors who can help people experiencing mental health-related distress. People can call or text 988 or chat 988lifeline.org for themselves or if they are worried about a loved one who may need crisis support.

## 2021-10-15 NOTE — Discharge Summary (Signed)
[name] to be D/C'd Home per NP order. Discussed with the patient and all questions fully answered. Pt refused AVS. Patient escorted out and D/C home via private auto.  Clois Dupes  10/15/2021 8:16 AM

## 2021-10-16 ENCOUNTER — Emergency Department (HOSPITAL_COMMUNITY)
Admission: EM | Admit: 2021-10-16 | Discharge: 2021-10-16 | Disposition: A | Discharge status: Home / Self Care | Payer: Medicaid Other | Attending: Emergency Medicine | Admitting: Emergency Medicine

## 2021-10-16 DIAGNOSIS — Z765 Malingerer [conscious simulation]: Secondary | ICD-10-CM | POA: Diagnosis not present

## 2021-10-16 DIAGNOSIS — R4182 Altered mental status, unspecified: Secondary | ICD-10-CM | POA: Diagnosis present

## 2021-10-16 NOTE — ED Triage Notes (Signed)
Patient was brought in by EMS found outside of a business. Patient appeared confused and not answering questions

## 2021-10-16 NOTE — ED Notes (Signed)
Patient responds " I don't know" to most questions. Patient does not give any other detail as to why he is here.

## 2021-10-16 NOTE — ED Provider Notes (Signed)
[facility]-EMERGENCY DEPT Provider Note   CSN: [id number] Arrival date & time: 10/16/21  0146     History  Chief Complaint  Patient presents with   Altered Mental Status    [name] is a 31 y.o. male.  The history is provided by the patient, the EMS personnel and medical records.  Altered Mental Status [name] is a 31 y.o. male who presents to the Emergency Department complaining of altered mental status.  Patient presents to the emergency department for evaluation of altered mental status.  He was found outside a business appearing confused and not answering questions.  On ED arrival patient states "I do not know" repeatedly.  He has a longstanding history of schizophrenia and has an act team.  He has multiple behavioral health as well as emergency department visits for various complaints.    Home Medications Prior to Admission medications   Medication Sig Start Date End Date Taking? Authorizing Provider  Paliperidone Palmitate (INVEGA TRINZA IM) Inject into the muscle every 3 (three) months. Patient not taking: Reported on 08/20/2021    [provider]  paliperidone (INVEGA) 6 MG 24 hr tablet Take 1 tablet (6 mg total) by mouth daily for 14 days. Patient not taking: No sig reported 01/02/21 02/22/21  Mare Ferrari, PA-C      Allergies    Shellfish allergy and Bee pollen    Review of Systems   Review of Systems  All other systems reviewed and are negative.  Physical Exam Updated Vital Signs BP 108/66 (BP Location: Right Arm)    Pulse 68    Temp 97.8 F (36.6 C) (Oral)    Resp 15    SpO2 100%  Physical Exam Vitals and nursing note reviewed.  Constitutional:      Appearance: He is well-developed.  HENT:     Head: Normocephalic and atraumatic.  Cardiovascular:     Rate and Rhythm: Normal rate and regular rhythm.  Pulmonary:     Effort: Pulmonary effort is normal. No respiratory distress.  Abdominal:     Palpations: Abdomen is  soft.     Tenderness: There is no abdominal tenderness. There is no guarding or rebound.  Musculoskeletal:        General: No tenderness.  Skin:    General: Skin is warm and dry.  Neurological:     Mental Status: He is alert and oriented to person, place, and time.     Comments: Moves all extremities symmetrically and strongly.  Oriented to person place and recent events.  Psychiatric:        Behavior: Behavior normal.     Comments: No active SI, HI.    ED Results / Procedures / Treatments   Labs (all labs ordered are listed, but only abnormal results are displayed) Labs Reviewed - No data to display   EKG None  Radiology No results found.  Procedures Procedures    Medications Ordered in ED Medications - No data to display  ED Course/ Medical Decision Making/ A&P                           Medical Decision Making Patient with history of schizophrenia, followed by an act team here for evaluation of altered mental status.  Initially patient stated I do not know when questions were asked.  He later stated he had headache and a cough.  On repeat questioning he continued to say I do not  know.  When patient was told that we were concerned about his confusion and recommend checking multiple labs and doing a full evaluation patient abruptly said "I do not want needles, no needles."  And states that he has no current concerns.  He refuses to have staff contact his family.  He does report that he has a safe place to stay.  He has no somatic or urgent psychiatric complaints.   Patient given a sandwich and allowed to sleep for the remainder of the evening.  When awoken patient states he has no complaints and discharged with resources for the community.        Final Clinical Impression(s) / ED Diagnoses Final diagnoses:  Malingering    Rx / DC Orders ED Discharge Orders     None         Tilden Fossa, MD 10/16/21 [id number]

## 2021-10-24 ENCOUNTER — Telehealth (HOSPITAL_COMMUNITY): Payer: Self-pay

## 2021-10-24 NOTE — BH Assessment (Signed)
Care Management - [place] Follow Up Discharges   Writer attempted to make contact with patient today and was unsuccessful.  No voicemail, phone just rang.    Per chart review, patient will follow up with [facility] Team.

## 2021-10-31 ENCOUNTER — Encounter (HOSPITAL_COMMUNITY): Payer: Self-pay | Admitting: *Deleted

## 2021-10-31 ENCOUNTER — Other Ambulatory Visit: Payer: Self-pay

## 2021-10-31 ENCOUNTER — Emergency Department (HOSPITAL_COMMUNITY)
Admission: EM | Admit: 2021-10-31 | Discharge: 2021-10-31 | Disposition: A | Discharge status: Home / Self Care | Payer: Medicaid Other | Attending: Emergency Medicine | Admitting: Emergency Medicine

## 2021-10-31 DIAGNOSIS — Z20822 Contact with and (suspected) exposure to covid-19: Secondary | ICD-10-CM | POA: Insufficient documentation

## 2021-10-31 DIAGNOSIS — F99 Mental disorder, not otherwise specified: Secondary | ICD-10-CM

## 2021-10-31 DIAGNOSIS — F419 Anxiety disorder, unspecified: Secondary | ICD-10-CM | POA: Insufficient documentation

## 2021-10-31 LAB — BASIC METABOLIC PANEL
Anion gap: 6 (ref 5–15)
BUN: 9 mg/dL (ref 6–20)
CO2: 26 mmol/L (ref 22–32)
Calcium: 9 mg/dL (ref 8.9–10.3)
Chloride: 105 mmol/L (ref 98–111)
Creatinine, Ser: 1.03 mg/dL (ref 0.61–1.24)
GFR, Estimated: >60 mL/min (ref >60)
Glucose, Bld: 97 mg/dL (ref 70–99)
Potassium: 4 mmol/L (ref 3.5–5.1)
Sodium: 137 mmol/L (ref 135–145)

## 2021-10-31 LAB — CBC WITH DIFFERENTIAL/PLATELET
Abs Immature Granulocytes: 0.01 10*3/uL (ref 0.00–0.07)
Basophils Absolute: 0.1 10*3/uL (ref 0.0–0.1)
Basophils Relative: 1 %
Eosinophils Absolute: 0.2 10*3/uL (ref 0.0–0.5)
Eosinophils Relative: 2 %
HCT: 44.9 % (ref 39.0–52.0)
Hemoglobin: 15.2 g/dL (ref 13.0–17.0)
Immature Granulocytes: 0 %
Lymphocytes Relative: 42 %
Lymphs Abs: 2.9 10*3/uL (ref 0.7–4.0)
MCH: 30.6 pg (ref 26.0–34.0)
MCHC: 33.9 g/dL (ref 30.0–36.0)
MCV: 90.5 fL (ref 80.0–100.0)
Monocytes Absolute: 0.5 10*3/uL (ref 0.1–1.0)
Monocytes Relative: 7 %
Neutro Abs: 3.3 10*3/uL (ref 1.7–7.7)
Neutrophils Relative %: 48 %
Platelets: 266 10*3/uL (ref 150–400)
RBC: 4.96 MIL/uL (ref 4.22–5.81)
RDW: 12.8 % (ref 11.5–15.5)
WBC: 6.9 10*3/uL (ref 4.0–10.5)
nRBC: 0 % (ref 0.0–0.2)

## 2021-10-31 LAB — RESP PANEL BY RT-PCR (FLU A&B, COVID) ARPGX2
Influenza A by PCR: NEGATIVE
Influenza B by PCR: NEGATIVE
SARS Coronavirus 2 by RT PCR: NEGATIVE

## 2021-10-31 LAB — ETHANOL: Alcohol, Ethyl (B): <10 mg/dL (ref <10)

## 2021-10-31 LAB — CBG MONITORING, ED: Glucose-Capillary: 98 mg/dL (ref 70–99)

## 2021-10-31 NOTE — ED Notes (Addendum)
Gave pt sandwich/ drink. Pt states he wants to go and have his clothes. This RN asked him if he could share why he first came to the ED. Pt very hyperactive and picking at his hair. Pt states, "I don't know man, I just am seeing things. I just need some help. I don't want to talk. I don't have any meds."

## 2021-10-31 NOTE — ED Notes (Signed)
Placed pt's belongings in locker #3

## 2021-10-31 NOTE — ED Provider Notes (Signed)
[name] EMERGENCY DEPARTMENT Provider Note   CSN: [id number] Arrival date & time: 10/31/21  0606     History  Chief Complaint  Patient presents with   psy    [name] is a 31 y.o. male.  31 year old male with history of paranoid schizophrenia presents today for evaluation.  Patient refuses to engage in interview responding only with "I do not know" to every question.  Per provider in triage note patient was complaining of anxiety.  Looking at previous notes patient is also homeless presents to the emergency room frequently.    The history is provided by the patient. No language interpreter was used.      Home Medications Prior to Admission medications   Medication Sig Start Date End Date Taking? Authorizing Provider  Paliperidone Palmitate (INVEGA TRINZA IM) Inject into the muscle every 3 (three) months. Patient not taking: Reported on 08/20/2021    [provider]  paliperidone (INVEGA) 6 MG 24 hr tablet Take 1 tablet (6 mg total) by mouth daily for 14 days. Patient not taking: No sig reported 01/02/21 02/22/21  Mare Ferrari, PA-C      Allergies    Shellfish allergy and Bee pollen    Review of Systems   Review of Systems  Unable to perform ROS: Psychiatric disorder   Physical Exam Updated Vital Signs BP 130/73 (BP Location: Left Arm)    Pulse 76    Temp 97.6 F (36.4 C) (Oral)    Resp 19    Ht 5\' 10"  (1.778 m)    Wt 72.6 kg    SpO2 96%    BMI 22.97 kg/m  Physical Exam Vitals and nursing note reviewed.  Constitutional:      General: He is not in acute distress.    Appearance: Normal appearance. He is not ill-appearing.  HENT:     Head: Normocephalic and atraumatic.     Nose: Nose normal.  Eyes:     Conjunctiva/sclera: Conjunctivae normal.  Pulmonary:     Effort: Pulmonary effort is normal. No respiratory distress.  Musculoskeletal:        General: No deformity.  Skin:    Findings: No rash.  Neurological:     Mental  Status: He is alert.  Psychiatric:        Attention and Perception: He is attentive.        Mood and Affect: Mood is anxious. Mood is not depressed. Affect is not angry or tearful.        Behavior: Behavior is uncooperative.    ED Results / Procedures / Treatments   Labs (all labs ordered are listed, but only abnormal results are displayed) Labs Reviewed  RESP PANEL BY RT-PCR (FLU A&B, COVID) ARPGX2  ETHANOL  CBC WITH DIFFERENTIAL/PLATELET  BASIC METABOLIC PANEL  RAPID URINE DRUG SCREEN, HOSP PERFORMED  CBG MONITORING, ED    EKG None  Radiology No results found.  Procedures Procedures    Medications Ordered in ED Medications - No data to display  ED Course/ Medical Decision Making/ A&P Clinical Course as of 10/31/21 1327  Mon Oct 31, 2021  1101 Patient more engaged during the interview.  Still states he is not sure why he presented to the emergency room.  He denies SI/HI/AVH, however he did endorse AVH to the nursing staff.  He states that he is out of his home medication.  He is willing to stay for TTS consult. [AA]    Clinical Course User Index [AA]  Marita Kansas, PA-C                           Medical Decision Making  Medical Decision Making / ED Course   This patient presents to the ED for concern of psychiatric, this involves an extensive number of treatment options, and is a complaint that carries with it a high risk of complications and morbidity.  The differential diagnosis includes medication noncompliance  MDM: 31 year old male with history of paranoid schizophrenia presents today for evaluation.  Patient does not engage in interview and does not tell me why he presented to the emergency room today.  Continues to answer with "I do not know".  Does not questions when asked if he has SI/HI/AVH.  Patient work-up is unremarkable with CBC without cytosis, anemia, BMP without electrolyte derangements, renal insufficiency, ethanol level, and respiratory panel negative  for COVID, and flu.  Patient is medically cleared and will consult TTS for psych eval.  Patient evaluated by TTS and psych and has been psych cleared.  Patient was also evaluated by ACTT nurse was contacted by TTS.  Patient is in stable condition for discharge.  Lab Tests: -I ordered, reviewed, and interpreted labs.   The pertinent results include:   Labs Reviewed  RESP PANEL BY RT-PCR (FLU A&B, COVID) ARPGX2  ETHANOL  CBC WITH DIFFERENTIAL/PLATELET  BASIC METABOLIC PANEL  RAPID URINE DRUG SCREEN, HOSP PERFORMED  CBG MONITORING, ED      EKG  EKG Interpretation  Date/Time:    Ventricular Rate:    PR Interval:    QRS Duration:   QT Interval:    QTC Calculation:   R Axis:     Text Interpretation:           Imaging Studies ordered: N/A   Medicines ordered and prescription drug management: No orders of the defined types were placed in this encounter.   -I have reviewed the patients home medicines and have made adjustments as needed  Consultations Obtained: I requested consultation with the TTS,  and discussed lab and imaging findings as well as pertinent plan - they recommend: Patient is psych cleared and patient is appropriate for discharge from their standpoint.  Reevaluation: After the interventions noted above, I reevaluated the patient and found that they have :stayed the same  Co morbidities that complicate the patient evaluation  Paranoid schizophrenia Past Medical History:  Diagnosis Date   Depression    Schizophrenia (HCC)       Dispostion: Patient is appropriate for discharge.  Patient discharged in stable condition.  Patient has been psych cleared.  Final Clinical Impression(s) / ED Diagnoses Final diagnoses:  Psychiatric complaint    Rx / DC Orders ED Discharge Orders     None         Marita Kansas, PA-C 10/31/21 1357    Gloris Manchester, MD 10/31/21 2207

## 2021-10-31 NOTE — ED Triage Notes (Signed)
The pt arrived by gems from the [address].  He was at [facility]  saying he wanted to stay our of the cold.  He did not want to answer questions he kept saying that he did not want to talk  when he was asked if he knew where he was he said that he did not want to talk

## 2021-10-31 NOTE — ED Notes (Signed)
This RN attempted to talk with pt and assess why he is here. Pt just stared at this RN and said "I don't know." Then after a few minutes pt said, "I want a chia pet." Pt has blood shot eyes. Pt refuses to communicate any further with this RN.

## 2021-10-31 NOTE — ED Notes (Signed)
Patient dressed out in [place].belongs already  bag

## 2021-10-31 NOTE — ED Notes (Signed)
Pt talking with TTS at this time.  

## 2021-10-31 NOTE — ED Notes (Signed)
All pt belongings given back to pt. ACT team at bedside and will take pt to his apartment. Pt cooperative for discharge. Denies SI/HI

## 2021-10-31 NOTE — Discharge Instructions (Signed)
Your evaluated in the emergency room.  You were seen by psychiatric team and are deemed appropriate for discharge home.  You were also seen by ACTT prior to discharge.  If you have any worsening symptoms have thoughts of hurting yourself or anyone else please return to the emergency room.

## 2021-10-31 NOTE — BH Assessment (Signed)
Comprehensive Clinical Assessment (CCA) Screening, Triage and Referral Note  10/31/2021 [name] [id number]  Disposition: Per [name], NP patient does not meet inpatient criteria.  With patient consent, [name] was contacted.  Adrian Cornfield, Warner with [name] plans to meet pt in the ED to establish connection with pt to continue Warner. Warner, [name] aware and will assist with coordinating contact.   The patient demonstrates the following risk factors for suicide: Chronic risk factors for suicide include: psychiatric disorder of Schizophrenia, unspecified . Acute risk factors for suicide include: social withdrawal/isolation. Protective factors for this patient include: positive social support, positive therapeutic relationship, [name] for the future, and life satisfaction. Considering these factors, the overall suicide risk at this point appears to be low. Patient is appropriate for outpatient follow up.  Patient is a 31 year old male with a history of Schizophrenia who presents voluntarily via EMS to [name] for [facility].  Per Warner,  "The pt arrived by gems from the street.  He was at [facility] saying he wanted to stay our of the cold.  He did not want to answer questions he kept saying that he did not want to talk  when he was asked if he knew where he was he said that he did not want to talk."  Upon assessment today, patient appears to be at baseline.  [name] is often a bit disorganized, often struggling to give relevant history. He is calm, cooperative, pleasant and AAOx3 on assessment.  He is followed by [name] Warner, however currently he is stating he is not prescribed medications.  [name] contacted patient's [name] Warner to assist with establishing contact.  Patient consented and states he would like to continue to work with his Warner Warner.  Per [name], with [name], they have been searching for patient for over a week.  Patient denies SI, HI, AVH or recent  substance use.  He confirms he needed to be "out of the cold," but unable to to express any other concerns.  He states he is ready to leave and plans to go back to his aunt's house.  Adrian Cornfield, Warner with [name], plans to meet patient at [name] to make contact.  Warner aware and will assist with Adrian Spittle Warner making contact with patient prior to d/c.   Chief Complaint:  Chief Complaint  Patient presents with   psy   Visit Diagnosis: Schizophrenia, unspecified  Flowsheet Row ED from 10/31/2021 in [name] OP Visit from 01/16/2021 in [name] ED from 05/08/2020 in [name]  Thoughts that you would be better off dead, or of hurting yourself in some way Not at all Not at all Not at all  PHQ-9 Total Score 4 8 6       Flowsheet Row ED from 10/31/2021 in [facility] EMERGENCY DEPARTMENT ED from 10/16/2021 in [name] Pre-admit (Canceled) from 02/22/2021 in [name]  C-SSRS RISK CATEGORY No Risk No Risk No Risk       Patient Reported Information How did you hear about 02/24/2021? Other (Comment) (EMS)  What Is the Reason for Your Visit/Call Today? Patient presented to the ED via EMS from the street.  He was at [name] earlier and only stated he wanted to stay out of the cold.  He did not want to answer questions he kept saying that he did not want to talk.  At baseline, [name] is often a bit disorganized.  He is followed by [name], however currently he is stating he is not prescribed medications.  [name] contacted patient's [name] Warner to assist with establishing contact.  Patient consented and states he would like to continue to work with his Warner Warner.  Per Adrian Stareanisha, with [facility], they have been searching for patient for over a week.  Patient denies SI, HI, AVH or recent substance use.  He confirms he  needed to be "out of the cold," but unable to to express any other concerns.  He states he is ready to leave and plans to go back to his aunt's house.  Adrian CornfieldStephanie, Warner with [facility], [facility].  Warner aware and will assist with [facility] Warner making contact with patient prior to d/c.  How Long Has This Been Causing You Problems? 1 wk - 1 month  What Do You Feel Would Help You the Most Today? Treatment for Depression or other mood problem; Medication(s)   Have You Recently Had Any Thoughts About Hurting Yourself? No  Are You Planning to Commit Suicide/Harm Yourself At This time? No   Have you Recently Had Thoughts About Hurting Someone [name]? No  Are You Planning to Harm Someone at This Time? No  Explanation: No data recorded  Have You Used Any Alcohol or Drugs in the Past 24 Hours? No  How Long Ago Did You Use Drugs or Alcohol? No data recorded What Did You Use and How Much? "I don't know"   Do You Currently Have a Therapist/Psychiatrist? Yes  Name of Therapist/Psychiatrist: [facility] - has been searching for patient and [facility] make contact with him   Have You Been Recently Discharged From Any Office Practice or Programs? No  Explanation of Discharge From Practice/Program: [facility] Multiple visits to emergency Warner.    CCA Screening Triage Referral Assessment Type of Contact: Tele-Assessment  Telemedicine Service Delivery: Telemedicine service delivery: This service was provided via telemedicine using a 2-way, interactive audio and video technology  Is this Initial or Reassessment? Initial Assessment  Date Telepsych consult ordered in [facility]:  10/31/21  Time [facility]psych consult ordered in [facility]:  0848  [facility]  [facility]   Collateral Involvement: [facility] - Sheran Lawlessanisha, [name] Warner   Does Patient Have a DealerCourt Appointed  Legal Guardian? No data recorded Name and Contact of Legal Guardian: No data recorded If Minor and Not Living with Parent(s), Who has Custody? NA  Is CPS involved or ever been involved? Never  Is APS involved or ever been involved? Never   Patient Determined To Be At Risk for Harm To Self or Others Based on Review of Patient Reported Information or Presenting Complaint? No  Method: No data recorded Availability of Means: No data recorded Intent: No data recorded Notification Required: No data recorded Additional Information for Danger to Others Potential: No data recorded Additional Comments for Danger to Others Potential: No data recorded Are There Guns or Other Weapons in Your Home? No data recorded Types of Guns/Weapons: No data recorded Are These Weapons Safely Secured?                            No data recorded Who Could Verify You Are Able To Have These Secured: No data recorded Do  You Have any Outstanding Charges, Pending Court Dates, Parole/Probation? No data recorded Contacted To Inform of Risk of Harm To Self or Others: Other: Comment (NA)   Does Patient Present under Involuntary Commitment? No  IVC Papers Initial File Date: No data recorded  [place] of Residence: [place]   Patient Currently Receiving the Following Warner: Warner Psychologist, educational)   Determination of Need: Routine (7 days)   Options For Referral: Medication Management   Discharge Disposition:     Yetta Glassman, [facility]

## 2021-10-31 NOTE — ED Notes (Signed)
Provided patient with warm blankets at bedside. Patient is resting and is comfortable.

## 2021-10-31 NOTE — ED Provider Triage Note (Signed)
Emergency Medicine Provider Triage Evaluation Note  [name] , a 31 y.o. male  was evaluated in triage.  Pt complains of anxiety, states he just feels fine, needs something for his anxiety, poor historian would not elaborate much more, does not endorse suicidal or homicidal ideations, states she does not know his name or where he lives.  Review of Systems  Positive: Anxiety, not feeling right Negative: Chest pain, shortness of breath  Physical Exam  BP 130/73 (BP Location: Left Arm)    Pulse 76    Temp 97.6 F (36.4 C) (Oral)    Resp 19    Ht 5\' 10"  (1.778 m)    Wt 72.6 kg    SpO2 96%    BMI 22.97 kg/m  Gen:   Awake, no distress   Resp:  Normal effort  MSK:   Moves extremities without difficulty  Other:    Medical Decision Making  Medically screening exam initiated at 6:15 AM.  Appropriate orders placed.  [name] was informed that the remainder of the evaluation will be completed by another provider, this initial triage assessment does not replace that evaluation, and the importance of remaining in the ED until their evaluation is complete. Presents feeling anxious, lab work imaging of been ordered will need further work-up.    , PA-C 10/31/21 [phone]

## 2021-11-01 ENCOUNTER — Ambulatory Visit (HOSPITAL_COMMUNITY)
Admission: EM | Admit: 2021-11-01 | Discharge: 2021-11-01 | Disposition: A | Discharge status: Home / Self Care | Payer: Medicaid Other | Attending: Nurse Practitioner | Admitting: Nurse Practitioner

## 2021-11-01 DIAGNOSIS — F209 Schizophrenia, unspecified: Secondary | ICD-10-CM

## 2021-11-01 DIAGNOSIS — Z765 Malingerer [conscious simulation]: Secondary | ICD-10-CM

## 2021-11-01 NOTE — Progress Notes (Signed)
TRIAGE: ROUTINE  [name], NP, reviewed pt's chart and information and determined pt can be psych cleared.    11/01/21 0325  [facility] Triage Screening (Walk-ins at [facility] only)  What Is the Reason for Your Visit/Call Today? Pt states, "I need somewhere to sleep." Pt denies SI, a plan to kill himself, HI, AVH, or SA. Pt answers, "I don't know," to other questions asked, including how he got [facility] the [facility], if he's been in [place] just today or several days, etc.  How Long Has This Been Causing You Problems? > than 6 months  Have You Recently Had Any Thoughts About Hurting Yourself? No  Are You Planning to Commit Suicide/Harm Yourself At This time? No  Have you Recently Had Thoughts About Hurting Someone [name]? No  Are You Planning To Harm Someone At This Time? No  Are you currently experiencing any auditory, visual or other hallucinations? No  Have You Used Any Alcohol or Drugs in the Past 24 Hours? No  How long ago did you use Drugs or Alcohol? N/A  Clinician description of patient physical appearance/behavior: Pt is wearing casual, age-appropriate attire. He does not have gloves or a hat, though he does initially have his hood from his jacket on. Pt is unwilling to provide more information than the concrete questions asked.  What Do You Feel Would Help You the Most Today?  ("I need a place to sleep.")  If access to [facility] was not available, would you have sought care in the Emergency Department? Yes  Determination of Need Routine (7 days)  Options For Referral Medication Management;Outpatient Therapy

## 2021-11-01 NOTE — Discharge Instructions (Addendum)

## 2021-11-01 NOTE — ED Provider Notes (Signed)
Behavioral Health Urgent Care Medical Screening Exam  Patient Name: [name] MRN: [id number] Date of Evaluation: 11/01/21 Chief Complaint:   Diagnosis:  Final diagnoses:  Schizophrenia, unspecified type (HCC)  Malingering    History of Present illness: [name] is a 31 y.o. male with a history of schizophrenia who presents voluntarily to [facility] as a walk-in. Patient appears to be at his baseline. Patient responds "I don't know" when asked several questions. Patient denies auditory and visual hallucinations. No indication that he is responding to internal stimuli. Patient denies suicidal ideations. He denies homicidal ideations. He denies substance abuse.   On evaluation patient is alert and oriented x 3. He is cooperative. Speech is clear and coherent. Mood is euthymic. Affect is blunt. Thought process is coherent. Denies auditory and visual hallucinations. No indication that patient is responding to internal stimuli. No evidence of delusional thought content. Denies suicidal ideations. Denies homicidal ideations. Denies substance abuse.     Patient was [facility] by [facility] Team while at [facility] on 10/31/2021.   Psychiatric Specialty Exam  Presentation  General Appearance:Appropriate for Environment; Neat  Eye Contact:Minimal  Speech:Clear and Coherent; Normal Rate  Speech Volume:Normal  Handedness:Right   Mood and Affect  Mood:Euthymic  Affect:Blunt   Thought Process  Thought Processes:Coherent  Descriptions of Associations:Intact  Orientation:Full (Time, Place and Person)  Thought Content:Logical  Diagnosis of Schizophrenia or Schizoaffective disorder in past: Yes  Duration of Psychotic Symptoms: Greater than six months  Hallucinations:None denies  Ideas of Reference:None  Suicidal Thoughts:No  Homicidal Thoughts:No   Sensorium  Memory:Immediate Fair; Recent Fair; Remote Fair  Judgment:Intact  Insight:Present   Executive Functions   Concentration:Fair  Attention Span:Fair  Recall:Fair  Fund of Knowledge:Fair  Language:Fair   Psychomotor Activity  Psychomotor Activity:Normal   Assets  Assets:Communication Skills; Desire for Improvement; Financial Resources/Insurance; Housing; Physical Health   Sleep  Sleep:Fair  Number of hours: 0 (Patient did not provide details regarding number of hours of sleep.)   No data recorded  Physical Exam: Physical Exam Constitutional:      General: He is not in acute distress.    Appearance: He is not ill-appearing, toxic-appearing or diaphoretic.  HENT:     Head: Normocephalic.     Right Ear: External ear normal.     Left Ear: External ear normal.  Eyes:     Pupils: Pupils are equal, round, and reactive to light.  Cardiovascular:     Rate and Rhythm: Normal rate.  Pulmonary:     Effort: Pulmonary effort is normal. No respiratory distress.  Musculoskeletal:        General: Normal range of motion.  Skin:    General: Skin is warm and dry.  Neurological:     Mental Status: He is alert and oriented to person, place, and time.  Psychiatric:        Speech: Speech normal.        Behavior: Behavior is cooperative.        Thought Content: Thought content is not paranoid or delusional. Thought content does not include homicidal or suicidal ideation. Thought content does not include suicidal plan.   Review of Systems  Constitutional:  Negative for chills, diaphoresis, fever, malaise/fatigue and weight loss.  HENT:  Negative for congestion.   Respiratory:  Negative for cough and shortness of breath.   Cardiovascular:  Negative for chest pain and palpitations.  Gastrointestinal:  Negative for diarrhea, nausea and vomiting.  Neurological:  Negative for dizziness and seizures.  Psychiatric/Behavioral:  Negative for depression, hallucinations, memory loss, substance abuse and suicidal ideas. The patient has insomnia. The patient is not nervous/anxious.   All other  systems reviewed and are negative.  Blood pressure 126/74, pulse 80, temperature 98.3 F (36.8 C), temperature source Oral, resp. rate 18, SpO2 100 %. There is no height or weight on file to calculate BMI.  Musculoskeletal: Strength & Muscle Tone: within normal limits Gait & Station: normal Patient leans: N/A   [facility] MSE Discharge Disposition for Follow up and Recommendations: Based on my evaluation the patient does not appear to have an emergency medical condition and can be discharged with resources and follow up care in outpatient services for Medication Management and Individual Therapy   Follow-up Information     Call  [facility].   Contact information: [address]  [address] [place] [address] [phone]                   Jackelyn Poling, NP 11/01/2021, 3:34 AM

## 2021-11-02 ENCOUNTER — Ambulatory Visit (HOSPITAL_COMMUNITY)
Admission: EM | Admit: 2021-11-02 | Discharge: 2021-11-02 | Disposition: A | Discharge status: Home / Self Care | Payer: Medicaid Other | Attending: Nurse Practitioner | Admitting: Nurse Practitioner

## 2021-11-02 DIAGNOSIS — Z765 Malingerer [conscious simulation]: Secondary | ICD-10-CM | POA: Insufficient documentation

## 2021-11-02 DIAGNOSIS — G4700 Insomnia, unspecified: Secondary | ICD-10-CM | POA: Insufficient documentation

## 2021-11-02 DIAGNOSIS — F209 Schizophrenia, unspecified: Secondary | ICD-10-CM | POA: Insufficient documentation

## 2021-11-02 NOTE — ED Provider Notes (Addendum)
Behavioral Health Urgent Care Medical Screening Exam  Patient Name: [name] MRN: [id number] Date of Evaluation: 11/02/21 Chief Complaint:   Diagnosis:  Final diagnoses:  Schizophrenia, unspecified type (HCC)  Malingering    History of Present illness: [name] is a 31 y.o. male with a history of schizophrenia who presents to [facility] voluntarily as a walk-in. Patient states "I don't feel like talking today." On evaluation patient is alert and oriented. Speech is clear and coherent. Mood is euthymic and affect is congruent with mood. Thought process is coherent and thought content is logical. Denies auditory and visual hallucinations. No indication that patient is responding to internal stimuli. No evidence of delusional thought content. Denies suicidal ideations. Denies homicidal ideations. Denies substance abuse.  Patient stands up and states "I got to get home. I love y'all man." Patient then stated that he was hungry and needed some rest. Patient provided with sandwich.  Psychiatric Specialty Exam  Presentation  General Appearance:Appropriate for Environment; Neat  Eye Contact:Fair  Speech:Clear and Coherent; Normal Rate  Speech Volume:Normal  Handedness:Right   Mood and Affect  Mood:Euthymic  Affect:Blunt   Thought Process  Thought Processes:Coherent  Descriptions of Associations:Intact  Orientation:Full (Time, Place and Person)  Thought Content:Logical  Diagnosis of Schizophrenia or Schizoaffective disorder in past: Yes  Duration of Psychotic Symptoms: Greater than six months  Hallucinations:None denies  Ideas of Reference:None  Suicidal Thoughts:No  Homicidal Thoughts:No   Sensorium  Memory:Immediate Fair; Recent Fair; Remote Fair  Judgment:Intact  Insight:Present   Executive Functions  Concentration:Fair  Attention Span:Fair  Recall:Fair  Fund of Knowledge:Fair  Language:Fair   Psychomotor Activity  Psychomotor  Activity:Normal   Assets  Assets:Desire for Improvement; Financial Resources/Insurance; Housing; Physical Health; Social Support   Sleep  Sleep:Fair  Number of hours: 0 (Patient did not provide details regarding number of hours of sleep.)   No data recorded  Physical Exam: Physical Exam Constitutional:      General: He is not in acute distress.    Appearance: He is not ill-appearing, toxic-appearing or diaphoretic.  HENT:     Head: Normocephalic.     Right Ear: External ear normal.     Left Ear: External ear normal.  Eyes:     Pupils: Pupils are equal, round, and reactive to light.  Cardiovascular:     Rate and Rhythm: Normal rate.  Pulmonary:     Effort: Pulmonary effort is normal. No respiratory distress.  Musculoskeletal:        General: Normal range of motion.  Skin:    General: Skin is warm and dry.  Neurological:     Mental Status: He is alert and oriented to person, place, and time.  Psychiatric:        Mood and Affect: Affect is blunt.        Speech: Speech normal.        Behavior: Behavior is cooperative.        Thought Content: Thought content is not paranoid or delusional. Thought content does not include homicidal or suicidal ideation. Thought content does not include suicidal plan.   Review of Systems  Constitutional:  Negative for chills, diaphoresis, fever, malaise/fatigue and weight loss.  HENT:  Negative for congestion.   Respiratory:  Negative for cough and shortness of breath.   Cardiovascular:  Negative for chest pain and palpitations.  Gastrointestinal:  Negative for diarrhea, nausea and vomiting.  Neurological:  Negative for dizziness and seizures.  Psychiatric/Behavioral:  Negative for depression, hallucinations, memory loss, substance  abuse and suicidal ideas. The patient has insomnia. The patient is not nervous/anxious.   All other systems reviewed and are negative.  Blood pressure (!) 117/98, pulse 72, temperature 98.9 F (37.2 C),  temperature source Oral, resp. rate 18, SpO2 99 %. There is no height or weight on file to calculate BMI.  Musculoskeletal: Strength & Muscle Tone: within normal limits Gait & Station: normal Patient leans: N/A   BH[facility] MSE Discharge Disposition for Follow up and Recommendations: Based on my evaluation the patient does not appear to have an emergency medical condition and can be discharged with resources and follow up care in outpatient services for Medication Management and Individual Therapy Follow up with ACT Team   Jackelyn Poling, NP 11/02/2021, 5:44 AM

## 2021-11-02 NOTE — BH Assessment (Signed)
[name], Routine, MR#[id number]; 31 years old presents this date unaccompanied.  Pt denies SI, HI or AVS.  Pt reports feeling anxious and wanting a " chill pill'.  Pt admits to [place] diagnosis;  is not taken current medication for symptom management  MSE signed.

## 2021-11-02 NOTE — Discharge Instructions (Signed)

## 2021-11-17 ENCOUNTER — Telehealth (HOSPITAL_COMMUNITY): Payer: Self-pay

## 2021-11-17 NOTE — BH Assessment (Signed)
Care Management - [facility] Follow Up Discharges  ° °Writer attempted to make contact with patient today and was unsuccessful.  No voicemail, phone just rang.   ° °Per chart review, patient will follow up with [name] Team.  ° °

## 2021-11-22 ENCOUNTER — Ambulatory Visit (HOSPITAL_COMMUNITY)
Admission: EM | Admit: 2021-11-22 | Discharge: 2021-11-22 | Disposition: A | Discharge status: Home / Self Care | Payer: Medicaid Other | Attending: Nurse Practitioner | Admitting: Nurse Practitioner

## 2021-11-22 DIAGNOSIS — F209 Schizophrenia, unspecified: Secondary | ICD-10-CM | POA: Insufficient documentation

## 2021-11-22 DIAGNOSIS — Z765 Malingerer [conscious simulation]: Secondary | ICD-10-CM | POA: Insufficient documentation

## 2021-11-22 NOTE — Discharge Instructions (Addendum)

## 2021-11-22 NOTE — Progress Notes (Signed)
TRIAGE: ROUTINE  Lindon Romp, NP, reviewed pt's chart and information and met with pt face-to-face and determined pt can be psych cleared.   11/22/21 0500  [place] Triage Screening (Walk-ins at [facility] only)  How Did You Hear About [place]? Self  What Is the Reason for Your Visit/Call Today? Pt states he is extremely high on marijuana at this moment. He states, "I'm sick. I need to get my head right." Pt identifies that he has not been taking his prescribed medication; he is unable to identify what medication he is supposed to be taking. Pt states, "I'm angry, but I'm not a bad person. My mom is gone." When clinician inquired as to whether his mother had passed away, he denied this, though added, "I don't know." Pt was proud of his clothing, stating, "I spend a lot of money on my clothes." Pt denies SI, HI, AVH, NSSIB, access to guns, or SA (with the exception of marijuana).  How Long Has This Been Causing You Problems? > than 6 months  Have You Recently Had Any Thoughts About Hurting Yourself? No  Are You Planning to Commit Suicide/Harm Yourself At This time? No  Have you Recently Had Thoughts About [place]? No  Are You Planning To Harm Someone At This Time? No  Are you currently experiencing any auditory, visual or other hallucinations? No  Have You Used Any Alcohol or Drugs in the Past 24 Hours? Yes  How long ago did you use Drugs or Alcohol? Pt states he is currently under the influence of marijuana.  What Did You Use and How Much? Pt states he used marijuana.  Do you have any current medical co-morbidities that require immediate attention? No  Clinician description of patient physical appearance/behavior: Pt is more talkative thank usual, answering questions fully rather than stating "I don't know." Pt is able to identify he should come to the hospital when he is in need of assistance.  What Do You Feel Would Help You the Most Today? Medication(s)  If access to [facility] Care was not  available, would you have sought care in the Emergency Department? Yes  Determination of Need Routine (7 days)  Options For Referral Medication Management;Outpatient Therapy

## 2021-11-22 NOTE — ED Notes (Signed)
Pt discharged in no acute distress. Denied SI/HI/AVH. A&O x4, ambulatory. Verbalized understanding of AVS instructions reviewed by RN. No belongings present in lockers. Pt escorted to lobby by staff. Bus pass given. Safety maintained.

## 2021-11-22 NOTE — ED Provider Notes (Signed)
Behavioral Health Urgent Care Medical Screening Exam  Patient Name: [name] MRN: [id number] Date of Evaluation: 11/22/21 Chief Complaint:   Diagnosis:  Final diagnoses:  Schizophrenia, unspecified type (HCC)  Malingering    History of Present illness: [name] is a 31 y.o. male with a history of schizophrenia who presents to [facility] voluntarily. Patient states "I am just high on marijuana." Patient denies suicidal ideations, homicidal ideations, and auditory/visual hallucinations. Patient primarily states "I don't know" when asked psychosocial related questions.   On evaluation patient is alert and oriented. He is calm and cooperative.  Speech is clear and coherent. Mood is euthymic. Affect is blunt.Thought process is coherent. Denies auditory and visual hallucinations. No indication that patient is responding to internal stimuli. Denies suicidal ideations. Denies homicidal ideations.   Psychiatric Specialty Exam  Presentation  General Appearance:Appropriate for Environment; Neat  Eye Contact:Fair  Speech:Clear and Coherent; Normal Rate  Speech Volume:Normal  Handedness:Right   Mood and Affect  Mood:Euthymic  Affect:Blunt   Thought Process  Thought Processes:Coherent  Descriptions of Associations:Intact  Orientation:Full (Time, Place and Person)  Thought Content:Logical  Diagnosis of Schizophrenia or Schizoaffective disorder in past: Yes  Duration of Psychotic Symptoms: Greater than six months  Hallucinations:None denies  Ideas of Reference:None  Suicidal Thoughts:No  Homicidal Thoughts:No   Sensorium  Memory:Immediate Fair; Recent Fair  Judgment:Intact  Insight:Present   Executive Functions  Concentration:Fair  Attention Span:Fair  Recall:Fair  Fund of Knowledge:Fair  Language:Fair   Psychomotor Activity  Psychomotor Activity:Normal   Assets  Assets:Housing; Physical Health; Social Support   Sleep  Sleep:Fair  Number of  hours: 0 (Patient did not provide details regarding number of hours of sleep.)   Nutritional Assessment (For OBS and FBC admissions only) Has the patient had a weight loss or gain of 10 pounds or more in the last 3 months?: No Has the patient had a decrease in food intake/or appetite?: No Does the patient have dental problems?: No Does the patient have eating habits or behaviors that may be indicators of an eating disorder including binging or inducing vomiting?: No Has the patient recently lost weight without trying?: 0 Has the patient been eating poorly because of a decreased appetite?: 0 Malnutrition Screening Tool Score: 0    Physical Exam: Physical Exam Constitutional:      General: He is not in acute distress.    Appearance: He is not ill-appearing, toxic-appearing or diaphoretic.  HENT:     Head: Normocephalic.     Right Ear: External ear normal.     Left Ear: External ear normal.  Eyes:     Pupils: Pupils are equal, round, and reactive to light.  Cardiovascular:     Rate and Rhythm: Normal rate.  Pulmonary:     Effort: Pulmonary effort is normal. No respiratory distress.  Musculoskeletal:        General: Normal range of motion.  Skin:    General: Skin is warm and dry.  Neurological:     Mental Status: He is alert and oriented to person, place, and time.  Psychiatric:        Mood and Affect: Mood is not depressed.        Speech: Speech normal.        Behavior: Behavior is cooperative.        Thought Content: Thought content does not include homicidal or suicidal ideation. Thought content does not include suicidal plan.   Review of Systems  Constitutional:  Negative for chills, diaphoresis, fever,  malaise/fatigue and weight loss.  HENT:  Negative for congestion.   Respiratory:  Negative for cough and shortness of breath.   Cardiovascular:  Negative for chest pain and palpitations.  Gastrointestinal:  Negative for diarrhea, nausea and vomiting.  Neurological:   Negative for dizziness and seizures.  Psychiatric/Behavioral:  Positive for substance abuse. Negative for depression, hallucinations, memory loss and suicidal ideas. The patient has insomnia. The patient is not nervous/anxious.   All other systems reviewed and are negative.  Blood pressure 122/81, pulse 68, temperature 98.6 F (37 C), temperature source Oral, resp. rate 18, SpO2 98 %. There is no height or weight on file to calculate BMI.  Musculoskeletal: Strength & Muscle Tone: within normal limits Gait & Station: normal Patient leans: N/A   [facility] MSE Discharge Disposition for Follow up and Recommendations: Based on my evaluation the patient does not appear to have an emergency medical condition and can be discharged with resources and follow up care in outpatient services for Medication Management and Individual Therapy   Jackelyn Poling, NP 11/22/2021, 5:34 AM

## 2021-11-26 ENCOUNTER — Encounter (HOSPITAL_COMMUNITY): Payer: Self-pay | Admitting: Emergency Medicine

## 2021-11-26 ENCOUNTER — Other Ambulatory Visit: Payer: Self-pay

## 2021-11-26 ENCOUNTER — Emergency Department (HOSPITAL_COMMUNITY)
Admission: EM | Admit: 2021-11-26 | Discharge: 2021-11-26 | Disposition: A | Discharge status: Home / Self Care | Payer: Medicaid - Out of State | Attending: Emergency Medicine | Admitting: Emergency Medicine

## 2021-11-26 DIAGNOSIS — Z760 Encounter for issue of repeat prescription: Secondary | ICD-10-CM | POA: Insufficient documentation

## 2021-11-26 DIAGNOSIS — F329 Major depressive disorder, single episode, unspecified: Secondary | ICD-10-CM | POA: Insufficient documentation

## 2021-11-26 DIAGNOSIS — Z79899 Other long term (current) drug therapy: Secondary | ICD-10-CM

## 2021-11-26 DIAGNOSIS — F209 Schizophrenia, unspecified: Secondary | ICD-10-CM | POA: Diagnosis not present

## 2021-11-26 NOTE — ED Triage Notes (Signed)
Patient here for refill of a medication.

## 2021-11-26 NOTE — Discharge Instructions (Signed)
Follow-up with your ACT team at [facility] for administration of your psychiatric medications.

## 2021-11-26 NOTE — ED Provider Notes (Signed)
[name] Provider Note   CSN: [id number] Arrival date & time: 11/26/21  0329     History  Chief Complaint  Patient presents with   Medication Refill    [name] is a 31 y.o. male.  31 year old male with a history of schizophrenia and depression presents to the emergency Warner requesting refills of his medications.  He cannot tell me what medications he takes or when he last was administered his medicines.  He complains of ongoing hallucinations, but denies command hallucinations, SI, HI.  Has been seen in the emergency Warner 30 times in the past 6 months for various complaints; often behavioral.   Medication Refill     Home Medications Prior to Admission medications   Medication Sig Start Date End Date Taking? Authorizing Provider  Paliperidone Palmitate (INVEGA TRINZA IM) Inject into the muscle every 3 (three) months. Patient not taking: Reported on 08/20/2021    [provider]  paliperidone (INVEGA) 6 MG 24 hr tablet Take 1 tablet (6 mg total) by mouth daily for 14 days. Patient not taking: No sig reported 01/02/21 02/22/21  Garald Balding, PA-C      Allergies    Shellfish allergy and Bee pollen    Review of Systems   Review of Systems Ten systems reviewed and are negative for acute change, except as noted in the HPI.    Physical Exam Updated Vital Signs BP (!) 147/102 (BP Location: Right Arm)    Pulse 62    Temp (!) 97.4 F (36.3 C) (Oral)    Resp 14    SpO2 100%   Physical Exam Vitals and nursing note reviewed.  Constitutional:      General: He is not in acute distress.    Appearance: He is well-developed. He is not diaphoretic.  HENT:     Head: Normocephalic and atraumatic.  Eyes:     General: No scleral icterus.    Conjunctiva/sclera: Conjunctivae normal.  Pulmonary:     Effort: Pulmonary effort is normal. No respiratory distress.  Musculoskeletal:        General: Normal range of motion.      Cervical back: Normal range of motion.  Skin:    General: Skin is warm and dry.     Coloration: Skin is not pale.     Findings: No erythema or rash.  Neurological:     Mental Status: He is alert and oriented to person, place, and time.  Psychiatric:        Attention and Perception: He does not perceive auditory or visual hallucinations.        Mood and Affect: Affect is flat.        Speech: Speech normal.        Behavior: Behavior is uncooperative and withdrawn.        Thought Content: Thought content does not include homicidal or suicidal ideation.    ED Results / Procedures / Treatments   Labs (all labs ordered are listed, but only abnormal results are displayed) Labs Reviewed - No data to display  EKG None  Radiology No results found.  Procedures Procedures    Medications Ordered in ED Medications - No data to display  ED Course/ Medical Decision Making/ A&P Clinical Course as of 11/26/21 0520  Sat Nov 26, 2021  0450 Spoke with Adrian Reasoner, NP at [facility] regarding patient and past prescribed medications. Upon chart review, [place] reports patient's ACT team at [facility] administers his monthly Invega shots  and the patient should continue to follow up with them. [KH]    Clinical Course User Index [KH] Adrian Breach, PA-C                           Medical Decision Making  This patient presents to the ED for concern of hallucinations, this involves an extensive number of treatment options, and is a complaint that carries with it a high risk of complications and morbidity.  The differential diagnosis includes acute psychosis vs substance induced mood disorder vs intracranial process vs noncompliance   Co morbidities that complicate the patient evaluation  Schizophrenia   Additional history obtained:  External records from outside source obtained and reviewed including prior visits to [facility] as well as prior behavioral hospitalizations   Consultations  Obtained:  I requested consultation with the APP at [facility], and discussed the pertinent plan - they recommend: continued follow up at [facility] for administration of the patient's prescribed medicines.   Problem List / ED Course:  Schizophrenia Depression    Reevaluation:  After the interventions noted above, I reevaluated the patient and found that they have : remained stable   Social Determinants of Health:  Medication noncompliance    Dispostion:  After consideration of the diagnostic results and the patients response to treatment, I feel that the patent would benefit from continued outpatient follow up at [facility]. Patient advised to contact his ACT team at [facility] to set up administration of his medications.          Final Clinical Impression(s) / ED Diagnoses Final diagnoses:  Medication management    Rx / DC Orders ED Discharge Orders     None         Adrian Breach, PA-C 11/26/21 0526    Adrian Fraise, MD 11/26/21 [id number]

## 2021-11-27 ENCOUNTER — Ambulatory Visit (HOSPITAL_COMMUNITY)
Admission: EM | Admit: 2021-11-27 | Discharge: 2021-11-27 | Disposition: A | Discharge status: Home / Self Care | Payer: Medicaid Other | Attending: Psychiatry | Admitting: Psychiatry

## 2021-11-27 DIAGNOSIS — F209 Schizophrenia, unspecified: Secondary | ICD-10-CM | POA: Insufficient documentation

## 2021-11-27 NOTE — Progress Notes (Signed)
°   11/27/21 0333  [place] (Walk-ins at [facility] only)  How Did You Hear About [place]? Self  What Is the Reason for Your Visit/Call Today? [name] is a 31 year old male presenting voluntary as a walk-in to [facility] requesting a place to sleep. Patient states "I am extremely tired". Patient denies SI, HI, alcohol/drug usage and psychosis. Patient has history of schizophrenia. Patient has history of multiple ED visits, last one seen 11/26/21 at [facility] for ongoing hallucinations, no command voices. Clinician asked about patients hand, what happened and who wrapped his hand in gauze, patient stated "I don't know". Patient expressed that it was cold outside. Patient did not report any additional crisis information.  How Long Has This Been Causing You Problems? > than 6 months  Have You Recently Had Any Thoughts About Hurting Yourself? No  Are You Planning to Commit Suicide/Harm Yourself At This time? No  Have you Recently Had Thoughts About [place]? No  Are You Planning To Harm Someone At This Time? No  Are you currently experiencing any auditory, visual or other hallucinations? No  Have You Used Any Alcohol or Drugs in the Past 24 Hours? No  Do you have any current medical co-morbidities that require immediate attention? No  Clinician description of patient physical appearance/behavior: discheveled / calm and cooperative  What Do You Feel Would Help You the Most Today? Housing Assistance;Food Assistance  If access to [facility] was not available, would you have sought care in the Emergency Department? Yes  Determination of Need Routine (7 days)  Options For Referral Medication Management;Outpatient Therapy

## 2021-11-27 NOTE — ED Provider Notes (Signed)
Behavioral Health Urgent Care Medical Screening Exam  Patient Name: [name] MRN: [id number] Date of Evaluation: 11/27/21 Chief Complaint:  I am sleepy Diagnosis:  Final diagnoses:  Schizophrenia, unspecified type (HCC)    History of Present illness: [name] is a 31 y.o. male with a history of schizophrenia presenting unaccompanied and voluntarily as a walk in to [facility]. Patient reports that he is here because he is sleepy and hungry. Patient is known to this Clinical research associate and has presented many times to this facility with a similar presentation. Patient appears disheveled but does not seem to be in any distress. Patient does not seem to be responding to any internal or external stimuli. Patient is calm and cooperative during the assessment but seems very drowsy. Patient is currently being followed by [facility] ACT Team for mental health services. Patient does not meet criteria for admission and will be discharged to follow up with his [facility] Act Team. Patient denies any SI/HI/AVH.  Psychiatric Specialty Exam  Presentation  General Appearance:Disheveled  Eye Contact:Minimal  Speech:Garbled  Speech Volume:Normal  Handedness:Right   Mood and Affect  Mood:Euthymic  Affect:Flat   Thought Process  Thought Processes:Coherent  Descriptions of Associations:Circumstantial  Orientation:Full (Time, Place and Person)  Thought Content:Tangential  Diagnosis of Schizophrenia or Schizoaffective disorder in past: Yes  Duration of Psychotic Symptoms: Greater than six months  Hallucinations:None denies  Ideas of Reference:None  Suicidal Thoughts:No  Homicidal Thoughts:No   Sensorium  Memory:Immediate Fair; Recent Fair; Remote Fair  Judgment:Intact  Insight:Present   Executive Functions  Concentration:Fair  Attention Span:Fair  Recall:Fair  Fund of Knowledge:Fair  Language:Fair   Psychomotor Activity  Psychomotor Activity:Normal   Assets  Assets:Resilience;  Physical Health; Social Support; Housing   Sleep  Sleep:Fair  Number of hours: -1   No data recorded  Physical Exam: Physical Exam Constitutional:      Appearance: He is normal weight.  HENT:     Head: Normocephalic and atraumatic.     Nose: Nose normal.  Eyes:     Pupils: Pupils are equal, round, and reactive to light.  Cardiovascular:     Rate and Rhythm: Normal rate.  Pulmonary:     Effort: Pulmonary effort is normal.  Abdominal:     General: Abdomen is flat.  Musculoskeletal:        General: Normal range of motion.     Cervical back: Normal range of motion.  Skin:    General: Skin is warm.  Neurological:     Mental Status: He is alert and oriented to person, place, and time.  Psychiatric:        Attention and Perception: Attention normal.        Mood and Affect: Mood normal.        Speech: Speech normal.        Behavior: Behavior normal.        Thought Content: Thought content normal.        Cognition and Memory: Cognition normal.        Judgment: Judgment normal.   Review of Systems  Constitutional: Negative.   HENT: Negative.    Eyes: Negative.   Respiratory: Negative.    Cardiovascular: Negative.   Gastrointestinal: Negative.   Genitourinary: Negative.   Musculoskeletal: Negative.   Skin: Negative.   Neurological: Negative.   Endo/Heme/Allergies: Negative.   Psychiatric/Behavioral:  Positive for hallucinations.   Blood pressure 116/84, pulse 76, temperature 98.3 F (36.8 C), temperature source Oral, resp. rate 18, SpO2 98 %. There  is no height or weight on file to calculate BMI.  Musculoskeletal: Strength & Muscle Tone: within normal limits Gait & Station: normal Patient leans: N/A   [facility] MSE Discharge Disposition for Follow up and Recommendations: Based on my evaluation the patient does not appear to have an emergency medical condition and can be discharged with resources and follow up care in outpatient services for Medication Management and  Individual Therapy   Jasper Riling, NP 11/27/2021, 5:21 AM

## 2021-11-27 NOTE — ED Notes (Signed)
Attempted to review discharge instructions with [name] who denied having any question and  refused  discharge paper work. Pt alert x3 and ambulatory given his belongings and shown to the exit.

## 2021-11-27 NOTE — Discharge Instructions (Addendum)

## 2021-12-25 ENCOUNTER — Encounter (HOSPITAL_COMMUNITY): Payer: Self-pay | Admitting: Emergency Medicine

## 2021-12-25 ENCOUNTER — Emergency Department (HOSPITAL_COMMUNITY)
Admission: EM | Admit: 2021-12-25 | Discharge: 2021-12-25 | Disposition: A | Discharge status: Home / Self Care | Payer: Medicaid - Out of State | Attending: Emergency Medicine | Admitting: Emergency Medicine

## 2021-12-25 DIAGNOSIS — R0602 Shortness of breath: Secondary | ICD-10-CM | POA: Diagnosis present

## 2021-12-25 NOTE — Discharge Instructions (Signed)
You came to the emergency department today to be evaluated for shortness of breath.  Your physical exam was reassuring.  Please follow-up with your primary care provider.  If you do not have a primary care provider he can follow-up with the [facility] health community health and wellness center as listed on this paperwork. ? ?Get help right away if: ?Your shortness of breath gets worse. ?You have shortness of breath when you are resting. ?You feel light-headed or you faint. ?You have a cough that is not controlled with medicines. ?You cough up blood. ?You have pain with breathing. ?You have pain in your chest, arms, shoulders, or abdomen. ?You have a fever. ?

## 2021-12-25 NOTE — ED Triage Notes (Signed)
Pt reports SOB. No distress, wheezing, or tachypnea.  ?

## 2021-12-25 NOTE — ED Provider Notes (Signed)
?[facility] DEPT ?Provider Note ? ? ?CSN: [id number] ?Arrival date & time: 12/25/21  [id number] ? ?  ? ?History ? ?Chief Complaint  ?Patient presents with  ? Shortness of Breath  ? ? ?[name] is a 31 y.o. male with past medical history of depression and schizophrenia.  Presents emergency department with a chief complaint of shortness of breath.  When asked how long shortness of breath has been present patient states "I do not know." ? ?Patient denies any fever, chills, coughing, wheezing, chest pain, leg swelling or tenderness, palpitations, hemoptysis, surgery in the last 4 weeks, history of PE or DVT, hormone use, history of cancer. ? ? ?Shortness of Breath ?Associated symptoms: no abdominal pain, no chest pain, no cough, no fever, no headaches, no neck pain, no rash, no vomiting and no wheezing   ? ?  ? ?Home Medications ?Prior to Admission medications   ?Medication Sig Start Date End Date Taking? Authorizing Provider  ?Paliperidone Palmitate (INVEGA TRINZA IM) Inject into the muscle every 3 (three) months. ?Patient not taking: Reported on 08/20/2021    [provider]  ?paliperidone (INVEGA) 6 MG 24 hr tablet Take 1 tablet (6 mg total) by mouth daily for 14 days. ?Patient not taking: No sig reported 01/02/21 02/22/21  Garald Balding, PA-C  ?   ? ?Allergies    ?Shellfish allergy and Bee pollen   ? ?Review of Systems   ?Review of Systems  ?Constitutional:  Negative for chills and fever.  ?Eyes:  Negative for visual disturbance.  ?Respiratory:  Positive for shortness of breath. Negative for cough and wheezing.   ?Cardiovascular:  Negative for chest pain, palpitations and leg swelling.  ?Gastrointestinal:  Negative for abdominal pain, nausea and vomiting.  ?Musculoskeletal:  Negative for back pain and neck pain.  ?Skin:  Negative for color change and rash.  ?Neurological:  Negative for dizziness, syncope, light-headedness and headaches.  ?Psychiatric/Behavioral:  Negative for  confusion.   ? ?Physical Exam ?Updated Vital Signs ?BP (!) 153/98 (BP Location: Right Arm)   Pulse 68   Temp 98.5 ?F (36.9 ?C) (Oral)   Resp 18   Ht 5\' 10"  (1.778 m)   Wt 72.6 kg   SpO2 100%   BMI 22.96 kg/m?  ?Physical Exam ?Vitals and nursing note reviewed.  ?Constitutional:   ?   General: He is not in acute distress. ?   Appearance: He is not ill-appearing, toxic-appearing or diaphoretic.  ?HENT:  ?   Head: Normocephalic.  ?Eyes:  ?   General: No scleral icterus.    ?   Right eye: No discharge.     ?   Left eye: No discharge.  ?Cardiovascular:  ?   Rate and Rhythm: Normal rate.  ?   Pulses:     ?     Radial pulses are 2+ on the right side and 2+ on the left side.  ?Pulmonary:  ?   Effort: Pulmonary effort is normal. No tachypnea or bradypnea.  ?   Breath sounds: Normal breath sounds. No stridor.  ?Musculoskeletal:  ?   Right lower leg: Normal.  ?   Left lower leg: Normal.  ?Skin: ?   General: Skin is warm and dry.  ?Neurological:  ?   General: No focal deficit present.  ?   Mental Status: He is alert.  ?   GCS: GCS eye subscore is 4. GCS verbal subscore is 5. GCS motor subscore is 6.  ?Psychiatric:     ?  Attention and Perception: He is attentive.     ?   Speech: Speech normal.     ?   Behavior: Behavior is cooperative.  ? ? ?ED Results / Procedures / Treatments   ?Labs ?(all labs ordered are listed, but only abnormal results are displayed) ?Labs Reviewed - No data to display ? ?EKG ?None ? ?Radiology ?No results found. ? ?Procedures ?Procedures  ? ? ?Medications Ordered in ED ?Medications - No data to display ? ?ED Course/ Medical Decision Making/ A&P ?  ?                        ?Medical Decision Making ? ?Alert 31 year old male in no acute stress, nontoxic-appearing.  Presents emerged from today for shortness of breath. ? ?Presented for patient.  Past medical records reviewed including previous provider notes, labs and imaging.  Patient has medical history as outlined in HPI which complicates his care.   Per chart review patient has frequent ED visits. ? ?Patient is unsure how long his shortness of breath has been present for.  Is in no acute respiratory distress at this time.  Lungs are clear to auscultation bilaterally.  Patient can be ruled out for PE via PERC criteria.  With lungs clear to auscultation bilaterally low suspicion for pneumonia at this time. ? ?Patient hemodynamically stable.  Will discharge patient to follow-up with PCP in outpatient setting.   ? ?Discussed results, findings, treatment and follow up. Patient advised of return precautions. Patient verbalized understanding and agreed with plan. ? ? ? ? ? ? ? ? ?Final Clinical Impression(s) / ED Diagnoses ?Final diagnoses:  ?None  ? ? ?Rx / DC Orders ?ED Discharge Orders   ? ? None  ? ?  ? ? ?  ?Loni Beckwith, PA-C ?12/25/21 [id number] ? ?  ?Daleen Bo, MD ?12/25/21 1514 ? ?

## 2021-12-25 NOTE — ED Notes (Signed)
Pt refused d/c vital signs and paperwork. I provided reinforced discharge education based off of discharge instructions. Pt acknowledged and understood my education. Pt had no further questions/concerns for provider/myself.  ?

## 2021-12-26 ENCOUNTER — Encounter (HOSPITAL_COMMUNITY): Payer: Self-pay

## 2021-12-26 ENCOUNTER — Emergency Department (HOSPITAL_COMMUNITY)
Admission: EM | Admit: 2021-12-26 | Discharge: 2021-12-26 | Disposition: A | Discharge status: Home / Self Care | Payer: Medicaid - Out of State | Attending: Emergency Medicine | Admitting: Emergency Medicine

## 2021-12-26 ENCOUNTER — Other Ambulatory Visit: Payer: Self-pay

## 2021-12-26 ENCOUNTER — Ambulatory Visit (HOSPITAL_COMMUNITY)
Admission: EM | Admit: 2021-12-26 | Discharge: 2021-12-26 | Disposition: A | Discharge status: Home / Self Care | Payer: Medicaid Other | Attending: Urology | Admitting: Urology

## 2021-12-26 DIAGNOSIS — F209 Schizophrenia, unspecified: Secondary | ICD-10-CM | POA: Insufficient documentation

## 2021-12-26 DIAGNOSIS — F200 Paranoid schizophrenia: Secondary | ICD-10-CM | POA: Insufficient documentation

## 2021-12-26 NOTE — ED Notes (Signed)
EDP at bedside  

## 2021-12-26 NOTE — ED Provider Notes (Addendum)
Behavioral Health Urgent Care Medical Screening Exam ? ?Patient Name: [name] ?MRN: [id number] ?Date of Evaluation: 12/26/21 ?Chief Complaint:   ?Diagnosis:  ?Final diagnoses:  ?Paranoid schizophrenia ([place])  ? ? ?History of Present illness: Angeles Koellner is a 31 year old male with psychiatric history of paranoid schizophrenia.  Patient presented voluntarily to [facility] for a walk-in assessment. ? ?Patient was evaluated at [facility]- ED on 12/25/21 with complaint of shortness of breath and was seen in the ED again earlier today after [employer] found him "sitting outside talking under his breath." He was found to be in no acute distress and discharge home.  ? ?Patient was seen face-to-face and his chart was reviewed by this NP. On evaluation, patient is lying down in assessment room in no apparent distress.  He is noted to be drowsy but oriented x4.  He reports that he is tired and needs to sleep.  She denies any psychiatric concern as well as medical concerns.  He denies suicidal ideation, homicidal ideation, auditory hallucination, and substance abuse.  He is unsure when he last had contact with his ACT team.  Patient did not answer question about medication compliance.  He is unsure of name of medicine he is currently taking.  Patient did not appear to be experiencing any acute psychosis.  He did not appear to be responding to any internal or external stimuli or experiencing any delusional thought content.  ? ?Psychiatric Specialty Exam ? ?Presentation  ?General Appearance:Appropriate for Environment ? ?Eye Contact:Good ? ?Speech:Clear and Coherent ? ?Speech Volume:Normal ? ?Handedness:Right ? ? ?Mood and Affect  ?Mood:Euthymic ? ?Affect:Congruent ? ? ?Thought Process  ?Thought Processes:Coherent ? ?Descriptions of Associations:Intact ? ?Orientation:Full (Time, Place and Person) ? ?Thought Content:WDL ? Diagnosis of Schizophrenia or Schizoaffective disorder in past: Yes ? Duration of Psychotic Symptoms: Greater than six  months ? Hallucinations:None ?denies ? ?Ideas of Reference:None ? ?Suicidal Thoughts:No ? ?Homicidal Thoughts:No ? ? ?Sensorium  ?Memory:Immediate Good; Recent Good; Remote Good ? ?Judgment:Good ? ?Insight:Lacking ? ? ?Executive Functions  ?Concentration:Fair ? ?Attention Span:Fair ? ?Recall:Fair ? ?[place] ? ?Language:Fair ? ? ?Psychomotor Activity  ?Psychomotor Activity:Normal ? ? ?Assets  ?Assets:Communication Skills; Desire for Improvement; Physical Health ? ? ?Sleep  ?Sleep:-- (patient states "I don't know") ? ?Number of hours: -1 ? ? ?No data recorded ? ?Physical Exam: ?Physical Exam ?Vitals and nursing note reviewed.  ?Constitutional:   ?   General: He is not in acute distress. ?   Appearance: He is well-developed.  ?HENT:  ?   Head: Normocephalic and atraumatic.  ?Eyes:  ?   Conjunctiva/sclera: Conjunctivae normal.  ?Cardiovascular:  ?   Rate and Rhythm: Normal rate.  ?Pulmonary:  ?   Effort: Pulmonary effort is normal. No respiratory distress.  ?   Breath sounds: Normal breath sounds.  ?Abdominal:  ?   Palpations: Abdomen is soft.  ?   Tenderness: There is no abdominal tenderness.  ?Musculoskeletal:     ?   General: No swelling.  ?   Cervical back: Neck supple.  ?Skin: ?   General: Skin is warm and dry.  ?   Capillary Refill: Capillary refill takes less than 2 seconds.  ?Neurological:  ?   Mental Status: He is alert and oriented to person, place, and time.  ?Psychiatric:     ?   Attention and Perception: Attention and perception normal.     ?   Mood and Affect: Mood and affect normal.     ?   Speech:  Speech normal.     ?   Behavior: Behavior normal. Behavior is cooperative.     ?   Thought Content: Thought content normal.     ?   Cognition and Memory: Cognition normal.  ? ?Review of Systems  ?Constitutional: Negative.   ?HENT: Negative.    ?Eyes: Negative.   ?Respiratory: Negative.    ?Cardiovascular: Negative.   ?Gastrointestinal: Negative.   ?Genitourinary: Negative.   ?Musculoskeletal:  Negative.   ?Skin: Negative.   ?Neurological: Negative.   ?Endo/Heme/Allergies: Negative.   ?Psychiatric/Behavioral: Negative.    ?Blood pressure 138/77, pulse 60, temperature (!) 97.4 ?F (36.3 ?C), temperature source Oral, resp. rate 18, SpO2 100 %. There is no height or weight on file to calculate BMI. ? ?Musculoskeletal: ?Strength & Muscle Tone: within normal limits ?Gait & Station: normal ?Patient leans: Right ? ? ?[facility] Discharge Disposition for Follow up and Recommendations: ?Based on my evaluation the patient does not appear to have an emergency medical condition and can be discharged with resources and follow up care in outpatient services for Medication Management and Individual Therapy ? ?Patient denies any psychiatric or medical concerns at this time.  He does not meet criteria for admission at this time.  Patient will be discharged with plan to follow up with his ACT team [facility]). ? ?No evidence of imminent danger to self or others at this time. Patient does not meet criteria for psychiatric admission or IVC. Supportive therapy provided about ongoing stressors. Discussed crisis plan, callling 911/988 or going to Emergency Dept ? ? ? ?Ophelia Shoulder, NP ?12/26/2021, 5:57 AM ? ?

## 2021-12-26 NOTE — Progress Notes (Signed)
TRIAGE: ROUTINE ? ? 12/26/21 0352  ?[facility] Triage Screening (Walk-ins at [facility] only)  ?How Did You Hear About [place]? Self  ?What Is the Reason for Your Visit/Call Today? Pt is lying down in his assessment room; it takes several minutes for him to sit up. Pt does not verbalize anything and instead answers questions with "uh-huh" (yes) and "uh-uh" (no). Pt denies current SI or a plan to kill himself, HI, AVH, NSSIB, and SA.  ?How Long Has This Been Causing You Problems? > than 6 months  ?Have You Recently Had Any Thoughts About Hurting Yourself? No  ?Are You Planning to Commit Suicide/Harm Yourself At This time? No  ?Have you Recently Had Thoughts About Hurting Someone [name]? No  ?Are You Planning To Harm Someone At This Time? No  ?Are you currently experiencing any auditory, visual or other hallucinations? No  ?Have You Used Any Alcohol or Drugs in the Past 24 Hours? No  ?How long ago did you use Drugs or Alcohol? Pt denies  ?What Did You Use and How Much? Pt denies  ?Do you have any current medical co-morbidities that require immediate attention? No  ?Clinician description of patient physical appearance/behavior: Pt is quiet and somewhat irritable, as evidenced by him mumbling under his breath but not speaking to clinician. He is dressed in shorts, Crocs and socks, and a bright yellow sweatshirt.  ?What Do You Feel Would Help You the Most Today? Medication(s);Housing Assistance  ?If access to [facility] Urgent Care was not available, would you have sought care in the Emergency Department? Yes  ?Determination of Need Routine (7 days)  ?Options For Referral Medication Management;Outpatient Therapy  ? ? ?

## 2021-12-26 NOTE — ED Triage Notes (Signed)
Pt BIB EMS for a psychiatric eval. Pt found by [employer] sitting outside of a gas station talking to the concrete. Pt was restless, nervous and talking under his breathe to himself with EMS. Pt calm and cooperative. VSS  ? ?CBG 108 ? ?

## 2021-12-26 NOTE — Discharge Instructions (Signed)
Return if you have thoughts of hurting yourself or others. ?

## 2021-12-26 NOTE — ED Provider Notes (Signed)
?[name] EMERGENCY DEPARTMENT ?Provider Note ? ? ?CSN: [id number] ?Arrival date & time:    ? ?  ? ?History ? ?Chief Complaint  ?Patient presents with  ? Psychiatric Evaluation  ? ? ?[name] is a 31 y.o. male. ? ?Patient with history of schizophrenia presents to the emergency department today.  Patient was seen in the emergency department yesterday for "shortness of breath" and discharged.  Per EMS report, patient was found by [employer] sitting outside talking under his breath.  Most recent psych evaluation was approximately 1 month ago.  Patient does not seem to be hallucinating.  No obvious self-harm.  He has been cooperative with EMS.  Patient currently has no complaints.  Denies thoughts of hurting himself or anyone else, states "I am fine". ? ? ?  ? ?Home Medications ?Prior to Admission medications   ?Medication Sig Start Date End Date Taking? Authorizing Provider  ?Paliperidone Palmitate (INVEGA TRINZA IM) Inject into the muscle every 3 (three) months. ?Patient not taking: Reported on 08/20/2021    [provider]  ?paliperidone (INVEGA) 6 MG 24 hr tablet Take 1 tablet (6 mg total) by mouth daily for 14 days. ?Patient not taking: No sig reported 01/02/21 02/22/21  Mare Ferrari, PA-C  ?   ? ?Allergies    ?Shellfish allergy and Bee pollen   ? ?Review of Systems   ?Review of Systems ? ?Physical Exam ?Updated Vital Signs ?BP 119/75   Pulse 67   Temp 97.6 ?F (36.4 ?C) (Oral)   Resp 16   Ht 5\' 10"  (1.778 m)   Wt 70 kg   SpO2 100%   BMI 22.14 kg/m?  ?Physical Exam ?Vitals and nursing note reviewed.  ?Constitutional:   ?   Appearance: He is well-developed.  ?HENT:  ?   Head: Normocephalic and atraumatic.  ?Eyes:  ?   Conjunctiva/sclera: Conjunctivae normal.  ?Pulmonary:  ?   Effort: No respiratory distress.  ?Musculoskeletal:  ?   Cervical back: Normal range of motion and neck supple.  ?Skin: ?   General: Skin is warm and dry.  ?Neurological:  ?   Mental Status: He is alert.   ?Psychiatric:     ?   Attention and Perception: Attention normal.     ?   Speech: Speech normal.     ?   Thought Content: Thought content does not include homicidal or suicidal ideation.  ?   Comments: Bizarre affect, likely chronic  ? ? ?ED Results / Procedures / Treatments   ?Labs ?(all labs ordered are listed, but only abnormal results are displayed) ?Labs Reviewed - No data to display ? ?EKG ?None ? ?Radiology ?No results found. ? ?Procedures ?Procedures  ? ? ?Medications Ordered in ED ?Medications - No data to display ? ?ED Course/ Medical Decision Making/ A&P ?  ? ?Patient seen and examined. History obtained directly from patient, EMS report, and previous behavioral health evaluation. ? ?When asked specific questioning, patient responds "I do not know".  This seems consistent with patient's previous visits.  I asked him if he would like to be evaluated by behavioral health, he states "no, no, no. I am fine." ? ?Most recent vital signs reviewed and are as follows: ?BP 119/75   Pulse 67   Temp 97.6 ?F (36.4 ?C) (Oral)   Resp 16   Ht 5\' 10"  (1.778 m)   Wt 70 kg   SpO2 100%   BMI 22.14 kg/m?  ? ?Initial impression: Schizophrenia, chronic ? ?  H&P, work-up, and plan reviewed with: Dr. Bebe Shaggy ? ?Return instructions discussed with patient: Return with any concerns or worsening. ? ?Follow-up instructions discussed with patient: [facility], care team ? ?                        ?Medical Decision Making ? ?Patient brought in by EMS tonight after being found by [employer].  Patient is exhibiting typical signs of schizophrenia.  This is a chronic problem for the patient.  He does not appear to be actively hallucinating or any threat to self or others.  Patient does not seem to want any further evaluation.  I do not see any obvious criteria for involuntary commitment at this time as patient's symptoms seem chronic.  Plan for discharge. ? ? ? ? ? ? ? ?Final Clinical Impression(s) / ED Diagnoses ?Final diagnoses:  ?Schizophrenia,  unspecified type (HCC)  ? ? ?Rx / DC Orders ?ED Discharge Orders   ? ? None  ? ?  ? ? ?  ?Renne Crigler, PA-C ?12/26/21 5852 ? ?  ?Zadie Rhine, MD ?12/26/21 0421 ? ?

## 2021-12-26 NOTE — ED Notes (Signed)
PT has been discharged, pt declined his AVS paperwork ?

## 2022-01-08 ENCOUNTER — Emergency Department (HOSPITAL_COMMUNITY)
Admission: EM | Admit: 2022-01-08 | Discharge: 2022-01-08 | Disposition: A | Discharge status: Home / Self Care | Payer: Medicaid - Out of State | Attending: Emergency Medicine | Admitting: Emergency Medicine

## 2022-01-08 ENCOUNTER — Encounter (HOSPITAL_COMMUNITY): Payer: Self-pay | Admitting: Emergency Medicine

## 2022-01-08 ENCOUNTER — Other Ambulatory Visit: Payer: Self-pay

## 2022-01-08 ENCOUNTER — Ambulatory Visit (HOSPITAL_COMMUNITY)
Admission: EM | Admit: 2022-01-08 | Discharge: 2022-01-08 | Disposition: A | Discharge status: Home / Self Care | Payer: Medicaid Other | Attending: Psychiatry | Admitting: Psychiatry

## 2022-01-08 DIAGNOSIS — F200 Paranoid schizophrenia: Secondary | ICD-10-CM | POA: Insufficient documentation

## 2022-01-08 DIAGNOSIS — Z046 Encounter for general psychiatric examination, requested by authority: Secondary | ICD-10-CM | POA: Diagnosis present

## 2022-01-08 DIAGNOSIS — R443 Hallucinations, unspecified: Secondary | ICD-10-CM

## 2022-01-08 DIAGNOSIS — R4587 Impulsiveness: Secondary | ICD-10-CM | POA: Insufficient documentation

## 2022-01-08 LAB — CBC
HCT: 45 % (ref 39.0–52.0)
Hemoglobin: 15.9 g/dL (ref 13.0–17.0)
MCH: 31.1 pg (ref 26.0–34.0)
MCHC: 35.3 g/dL (ref 30.0–36.0)
MCV: 87.9 fL (ref 80.0–100.0)
Platelets: 301 10*3/uL (ref 150–400)
RBC: 5.12 MIL/uL (ref 4.22–5.81)
RDW: 12.1 % (ref 11.5–15.5)
WBC: 7.6 10*3/uL (ref 4.0–10.5)
nRBC: 0 % (ref 0.0–0.2)

## 2022-01-08 LAB — COMPREHENSIVE METABOLIC PANEL
ALT: 17 U/L (ref 0–44)
AST: 27 U/L (ref 15–41)
Albumin: 4.4 g/dL (ref 3.5–5.0)
Alkaline Phosphatase: 38 U/L (ref 38–126)
Anion gap: 9 (ref 5–15)
BUN: 12 mg/dL (ref 6–20)
CO2: 25 mmol/L (ref 22–32)
Calcium: 9.2 mg/dL (ref 8.9–10.3)
Chloride: 106 mmol/L (ref 98–111)
Creatinine, Ser: 1.09 mg/dL (ref 0.61–1.24)
GFR, Estimated: >60 mL/min (ref >60)
Glucose, Bld: 97 mg/dL (ref 70–99)
Potassium: 4 mmol/L (ref 3.5–5.1)
Sodium: 140 mmol/L (ref 135–145)
Total Bilirubin: 0.8 mg/dL (ref 0.3–1.2)
Total Protein: 7.2 g/dL (ref 6.5–8.1)

## 2022-01-08 LAB — ETHANOL: Alcohol, Ethyl (B): <10 mg/dL (ref <10)

## 2022-01-08 MED ORDER — OLANZAPINE 5 MG PO TABS
5.0000 mg | ORAL_TABLET | Freq: Every day | ORAL | 0 refills | Status: DC
Start: 2022-01-08 — End: 2022-01-10

## 2022-01-08 MED ORDER — OLANZAPINE 5 MG PO TBDP
2.5000 mg | ORAL_TABLET | Freq: Once | ORAL | Status: AC
  Administered 2022-01-08: 2.5 mg via ORAL
  Filled 2022-01-08: qty 1

## 2022-01-08 NOTE — Discharge Instructions (Addendum)
?  Discharge recommendations:  ?Patient is to take medications as prescribed. ?Please see information for follow-up appointment with psychiatry and therapy. ?Please follow up with your primary care provider for all medical related needs.  ?Follow up with your [facility] ACT team for ongoing med management.  ? ?Therapy: We recommend that patient participate in individual therapy to address mental health concerns. ? ?Medications: The parent/guardian is to contact a medical professional and/or outpatient provider to address any new side effects that develop. Parent/guardian should update outpatient providers of any new medications and/or medication changes.  ? ?Atypical antipsychotics: If you are prescribed an atypical antipsychotic, it is recommended that your height, weight, BMI, blood pressure, fasting lipid panel, and fasting blood sugar be monitored by your outpatient providers. ? ?Safety:  ?The patient should abstain from use of illicit substances/drugs and abuse of any medications. ?If symptoms worsen or do not continue to improve or if the patient becomes actively suicidal or homicidal then it is recommended that the patient return to the closest hospital emergency department, the [facility], or call 911 for further evaluation and treatment. ?National Suicide Prevention Lifeline 1-800-SUICIDE or 226-762-4730. ? ?About 988 ?988 offers 24/7 access to trained crisis counselors who can help people experiencing mental health-related distress. People can call or text 988 or chat 988lifeline.org for themselves or if they are worried about a loved one who may need crisis support.  ?

## 2022-01-08 NOTE — Progress Notes (Signed)
Patient is routine.  He denies SI, HI or A/V hallucinations.  Patient resplies "I don't know" to most questions.  He says he is tired and has not slept. ?

## 2022-01-08 NOTE — ED Triage Notes (Signed)
Per EMS, pt asked for a ride to hospital.  Reports he is scared, talking to himself.  Hx is very limited b/c as questions were asked, agitation increased.  No information given.   ? ?Pt unwilling to speak to RN, will not answer any questions. ?

## 2022-01-08 NOTE — ED Notes (Signed)
Pt refused vitals 

## 2022-01-08 NOTE — ED Notes (Signed)
Pt's belongings placed in purple zone in left side of cabinet 7. ?

## 2022-01-08 NOTE — ED Provider Notes (Signed)
?[place] ?Provider Note ? ? ?CSN: [id number] ?Arrival date & time: 01/08/22  [id number] ? ?  ? ?History ? ?Chief Complaint  ?Patient presents with  ? Psychiatric Evaluation  ? ? ?[name] is a 31 y.o. male who presents emergency department chief complaint of "seeing things."  Patient has a history of paranoid schizophrenia.  He was seen earlier at the behavioral health urgent care and was discharged.  He presented here.  He states that he is "high off marijuana and just needs some help calming down."  He complains of "seeing and hearing things."  He has no other complaints at this time.  Patient states "I just need some medicine to help me stop seeing things." ? ?HPI ? ?  ? ?Home Medications ?Prior to Admission medications   ?Medication Sig Start Date End Date Taking? Authorizing Provider  ?paliperidone (INVEGA) 6 MG 24 hr tablet Take 1 tablet (6 mg total) by mouth daily for 14 days. ?Patient not taking: No sig reported 01/02/21 02/22/21  Garald Balding, PA-C  ?   ? ?Allergies    ?Shellfish allergy and Bee pollen   ? ?Review of Systems   ?Review of Systems ? ?Physical Exam ?Updated Vital Signs ?BP 120/66   Pulse 77   Temp 98 ?F (36.7 ?C) (Oral)   Resp 19   SpO2 100%  ?Physical Exam ?Vitals and nursing note reviewed.  ?Constitutional:   ?   General: He is not in acute distress. ?   Appearance: He is well-developed. He is not diaphoretic.  ?HENT:  ?   Head: Normocephalic and atraumatic.  ?Eyes:  ?   General: No scleral icterus. ?   Conjunctiva/sclera: Conjunctivae normal.  ?Cardiovascular:  ?   Rate and Rhythm: Normal rate and regular rhythm.  ?   Heart sounds: Normal heart sounds.  ?Pulmonary:  ?   Effort: Pulmonary effort is normal. No respiratory distress.  ?   Breath sounds: Normal breath sounds.  ?Abdominal:  ?   Palpations: Abdomen is soft.  ?   Tenderness: There is no abdominal tenderness.  ?Musculoskeletal:  ?   Cervical back: Normal range of motion and neck supple.   ?Skin: ?   General: Skin is warm and dry.  ?Neurological:  ?   Mental Status: He is alert.  ?Psychiatric:     ?   Attention and Perception: He perceives auditory and visual hallucinations.     ?   Mood and Affect: Affect is flat.     ?   Speech: Speech normal. He is communicative. Speech is not rapid and pressured or tangential.     ?   Behavior: Behavior is slowed. Behavior is cooperative.     ?   Thought Content: Thought content is paranoid.  ? ? ?ED Results / Procedures / Treatments   ?Labs ?(all labs ordered are listed, but only abnormal results are displayed) ?Labs Reviewed  ?COMPREHENSIVE METABOLIC PANEL  ?ETHANOL  ?CBC  ? ? ?EKG ?EKG Interpretation ? ?Date/Time:  Sunday January 08 2022 06:42:57 EDT ?Ventricular Rate:  69 ?PR Interval:  128 ?QRS Duration: 98 ?QT Interval:  412 ?QTC Calculation: 441 ?R Axis:   81 ?Text Interpretation: Normal sinus rhythm Early repolarization Confirmed by Randal Buba, April ([id number]) on 01/09/2022 12:28:43 PM ? ?Radiology ?No results found. ? ?Procedures ?Procedures  ? ? ?Medications Ordered in ED ?Medications  ?OLANZapine zydis (ZYPREXA) disintegrating tablet 2.5 mg (2.5 mg Oral Given 01/08/22 0817)  ? ? ?ED Course/  Medical Decision Making/ A&P ?  ?                        ?Medical Decision Making ?Amount and/or Complexity of Data Reviewed ?Labs: ordered. ? ?Risk ?Prescription drug management. ? ? ?Patient here with c/o hallucinations. ?He has an ACT team but has not followed appropriately. ?Asking for medicaitons to help with hallucinations. ?Given low dose zyprexa here and 2 week RX with advice to both remain compliant with psych meds  to avoid hallucinations, and to follow up with his psych care team. ?Patient  without SI/HI and is cognizant and aware.  ?Labs reviewed- no acute findings. ?EKG shows sinus rhythm at a rate of 69. No qt prolongation ?Safe for dc with close OP f/u ? ?  ? ? ? ? ?Final Clinical Impression(s) / ED Diagnoses ?Final diagnoses:  ?Hallucinations  ?Paranoid  schizophrenia ([place])  ? ? ?Rx / DC Orders ?ED Discharge Orders   ? ?      Ordered  ?  OLANZapine (ZYPREXA) 5 MG tablet  Daily at bedtime,   Status:  Discontinued       ? 01/08/22 1039  ? ?  ?  ? ?  ? ? ?  ?Margarita Mail, PA-C ?01/10/22 1449 ? ?  ?Varney Biles, MD ?01/13/22 1105 ? ?

## 2022-01-08 NOTE — ED Provider Notes (Signed)
Behavioral Health Urgent Care Medical Screening Exam ? ?Patient Name: [name] ?MRN: [id number] ?Date of Evaluation: 01/08/22 ?Chief Complaint:  I am tired ?- ?Diagnosis:  ?Final diagnoses:  ?Schizophrenia, paranoid ([place])  ? ? ?History of Present illness: [name] is a 31 y.o. male presenting voluntarily with a psychiatric history of paranoid schizophrenia to [place]. Patient is well known to the facility after many presentations for similar complaints. Patient reports that he is tired and that he does not know why he is here. When patient is asked about the last time he met with or spoke to his ACT team patient stated that he did not want to answer any questions.  ? ?On assessment patient is evaluation room sitting at the table with his shoes and socks off and states that he is tired and wants to sleep. Patient appears disheveled and drowsy looking. Patient states that he does not know when about medications and his current outpatient services. Patient does not appear to be in acute distress or responding to any internal or external stimuli at this time. Patient does not meet criteria for admission and will be discharged to follow up with his [facility] Team. Patient denies any SI/HI/AVH. ? ?Psychiatric Specialty Exam ? ?Presentation  ?General Appearance:Disheveled ? ?Eye Contact:Fair ? ?Speech:Clear and Coherent ? ?Speech Volume:Normal ? ?Handedness:Left ? ? ?Mood and Affect  ?Mood:Euthymic ? ?Affect:Congruent ? ? ?Thought Process  ?Thought Processes:Coherent ? ?Descriptions of Associations:Intact ? ?Orientation:Full (Time, Place and Person) ? ?Thought Content:WDL ? Diagnosis of Schizophrenia or Schizoaffective disorder in past: Yes ? Duration of Psychotic Symptoms: Greater than six months ? Hallucinations:None ?denies ? ?Ideas of Reference:None ? ?Suicidal Thoughts:No ? ?Homicidal Thoughts:No ? ? ?Sensorium  ?Memory:Immediate Good; Recent Good; Remote Poor ? ?Judgment:Fair ? ?Insight:Fair ? ? ?Executive  Functions  ?Concentration:Fair ? ?Attention Span:Fair ? ?Recall:Fair ? ?[place] ? ?Language:Fair ? ? ?Psychomotor Activity  ?Psychomotor Activity:Normal ? ? ?Assets  ?Assets:Communication Skills; Desire for Improvement; Physical Health ? ? ?Sleep  ?Sleep:-- (patient states "I don't know") ? ?Number of hours: -1 ? ? ?No data recorded ? ?Physical Exam: ?Physical Exam ?HENT:  ?   Head: Normocephalic and atraumatic.  ?   Nose: Nose normal.  ?Eyes:  ?   Pupils: Pupils are equal, round, and reactive to light.  ?Cardiovascular:  ?   Rate and Rhythm: Normal rate.  ?Pulmonary:  ?   Effort: Pulmonary effort is normal.  ?Abdominal:  ?   General: Abdomen is flat.  ?Musculoskeletal:     ?   General: Normal range of motion.  ?   Cervical back: Normal range of motion.  ?Skin: ?   General: Skin is warm.  ?Neurological:  ?   Mental Status: He is alert and oriented to person, place, and time.  ?Psychiatric:     ?   Attention and Perception: Attention normal.     ?   Mood and Affect: Mood normal.     ?   Speech: Speech normal.     ?   Behavior: Behavior is cooperative.     ?   Thought Content: Thought content normal.     ?   Cognition and Memory: Cognition normal.     ?   Judgment: Judgment is impulsive.  ? ?Review of Systems  ?Constitutional: Negative.   ?HENT: Negative.    ?Eyes: Negative.   ?Respiratory: Negative.    ?Cardiovascular: Negative.   ?Gastrointestinal: Negative.   ?Genitourinary: Negative.   ?Musculoskeletal: Negative.   ?  Skin: Negative.   ?Neurological: Negative.   ?Endo/Heme/Allergies: Negative.   ?Psychiatric/Behavioral: Negative.    ?Blood pressure (!) 143/95, pulse 85, temperature (!) 97.5 ?F (36.4 ?C), resp. rate 18, SpO2 97 %. There is no height or weight on file to calculate BMI. ? ?Musculoskeletal: ?Strength & Muscle Tone: within normal limits ?Gait & Station: normal ?Patient leans: N/A ? ? ?[facility] Discharge Disposition for Follow up and Recommendations: ?Based on my evaluation the patient does  not appear to have an emergency medical condition and can be discharged with resources and follow up care in outpatient services for Medication Management and Individual Therapy to follow up with [facility] ACT team.  ? ? ?Lucia Bitter, NP ?01/08/2022, 3:54 AM ? ?

## 2022-01-08 NOTE — Discharge Instructions (Addendum)
If you do not want to experience hallucinations, YOU NEED TO TAKE YOUR MEDICINE AND FOLLOW WITH YOUR ACT TEAM! I am discharging you with a medication that might help.  ?

## 2022-01-10 ENCOUNTER — Encounter (HOSPITAL_COMMUNITY): Payer: Self-pay | Admitting: Nurse Practitioner

## 2022-01-10 ENCOUNTER — Ambulatory Visit (HOSPITAL_COMMUNITY)
Admission: EM | Admit: 2022-01-10 | Discharge: 2022-01-10 | Disposition: A | Discharge status: Home / Self Care | Payer: Medicaid Other | Attending: Nurse Practitioner | Admitting: Nurse Practitioner

## 2022-01-10 DIAGNOSIS — F200 Paranoid schizophrenia: Secondary | ICD-10-CM | POA: Insufficient documentation

## 2022-01-10 MED ORDER — OLANZAPINE 10 MG PO TBDP: 10.0000 mg | ORAL_TABLET | Freq: Once | ORAL | Status: DC

## 2022-01-10 MED ORDER — MAGNESIUM HYDROXIDE 400 MG/5ML PO SUSP: 30.0000 mL | Freq: Every day | ORAL | Status: DC | PRN

## 2022-01-10 MED ORDER — ACETAMINOPHEN 325 MG PO TABS: 650.0000 mg | ORAL_TABLET | Freq: Four times a day (QID) | ORAL | Status: DC | PRN

## 2022-01-10 MED ORDER — ALUM & MAG HYDROXIDE-SIMETH 200-200-20 MG/5ML PO SUSP: 30.0000 mL | ORAL | Status: DC | PRN

## 2022-01-10 NOTE — Discharge Instructions (Signed)

## 2022-01-10 NOTE — BH Assessment (Signed)
Comprehensive Clinical Assessment (CCA) Note ? ?01/10/2022 ?[name] ?BP:[id number] ? ?DISPOSITION: Gave clinical report to Lindon Romp, NP who completed MSE and recommended Pt be admitted to continuous assessment. Pt initially agreed then changed his mind and insisted he wanted to leave. Pt does not meet criteria for IVC. ? ?The patient demonstrates the following risk factors for suicide: Chronic risk factors for suicide include: psychiatric disorder of schizophrenia and substance use disorder. Acute risk factors for suicide include: N/A. Protective factors for this patient include: positive therapeutic relationship. Considering these factors, the overall suicide risk at this point appears to be low. Patient is appropriate for outpatient follow up. ? ?[place] ED from 01/08/2022 in [place] ED from 12/26/2021 in [place] (Canceled) from 02/22/2021 in [place]  ?C-SSRS RISK CATEGORY No Risk No Risk No Risk  ? ?  ? ?Pt is a 31 year old single male who presents unaccompanied to [facility] after being transported voluntarily via Event organiser. He has a diagnosis of schizophrenia and a history of multiple presentations to emergency services - Pt was evaluated less than 48 hours ago. Pt presents as paranoid and bizarre. He responds to most questions with "I don't know." He does deny current suicidal ideation or homicidal ideation. He responds, "I don't know" to whether he is experiencing auditory or visual hallucinations. He responds, "I don't know" to whether he has used alcohol or drugs.  ? ?He responds, "I don't know" to what kind of help he needs. Pt's medical record indicates he receives ACTT services through [place]. Pt cannot say if has has spoken to ACTT recently. He does says that he would like something to eat and drink. Pt appears more bizarre than at his baseline. ? ?Pt is casually  dressed and mildly disheveled. He is alert. Pt speaks in a whispered tone, at moderate volume and normal pace. Motor behavior appears restless and Pt moves from room to room. Eye contact is sometimes staring and other times he puts his hands over his eyes. He appears hypervigilant. Pt's mood is anxious and affect is congruent with mood. Thought process is circumstantial with evidence of thought blocking. He follows directions and is cooperative. ? ? ?Chief Complaint:  ?Chief Complaint  ?Patient presents with  ? Delusional  ? ?Visit Diagnosis: F20.9 Schizophrenia ? ?CCA Screening, Triage and Referral (STR) ? ?Patient Reported Information ?How did you hear about [place]? Other (Comment) Risk manager) ? ?What Is the Reason for Your Visit/Call Today? Pt was brought to [facility] voluntarily by Event organiser. He appears paranoid and hypervigilant. He cannot say why he is here and answers most questions with "I don't know." He is restless and makes bizarre gestures and sometimes whispers. He denies suicidal ideation or homicidal ideation. When asked if he is experiencing hallucinations or has used substances, he responds "I don't know." ? ?How Long Has This Been Causing You Problems? > than 6 months ? ?What Do You Feel Would Help You the Most Today? -- (Pt says "I don't know.") ? ? ?Have You Recently Had Any Thoughts About Hurting Yourself? No ? ?Are You Planning to Commit Suicide/Harm Yourself At This time? No ? ? ?Have you Recently Had Thoughts About [place]? No ? ?Are You Planning to Harm Someone at This Time? No ? ?Explanation: No data recorded ? ?Have You Used Any Alcohol or Drugs in the Past 24 Hours? Yes ? ?How Long Ago Did You Use Drugs  or Alcohol? No data recorded ?What Did You Use and How Much? Pt says "I don't know." ? ? ?Do You Currently Have a Therapist/Psychiatrist? Yes ? ?Name of Therapist/Psychiatrist: [name] team ? ? ?Have You Been Recently Discharged From Any Office Practice or Programs?  No ? ?Explanation of Discharge From Practice/Program: Discharged from [facility] yesterday. Multiple visits to emergency services. ? ? ?  ?CCA Screening Triage Referral Assessment ?Type of Contact: Face-to-Face ? ?Telemedicine Service Delivery:   ?Is this Initial or Reassessment? Initial Assessment ? ?Date Telepsych consult ordered in [facility]:  10/31/21 ? ?Time Telepsych consult ordered in [facility]:  0848 ? ?Location of Assessment: [facility] Assessment Services ? ?Provider Location: [facility] Assessment Services ? ? ?Collateral Involvement: [name] team - Berdine Addison RN ? ? ?Does Patient Have a Stage manager Guardian? No data recorded ?Name and Contact of Legal Guardian: No data recorded ?If Minor and Not Living with Parent(s), Who has Custody? NA ? ?Is CPS involved or ever been involved? Never ? ?Is APS involved or ever been involved? Never ? ? ?Patient Determined To Be At Risk for Harm To Self or Others Based on Review of Patient Reported Information or Presenting Complaint? No ? ?Method: No data recorded ?Availability of Means: No data recorded ?Intent: No data recorded ?Notification Required: No data recorded ?Additional Information for Danger to Others Potential: No data recorded ?Additional Comments for Danger to Others Potential: No data recorded ?Are There Guns or Other Weapons in [place]? No data recorded ?Types of Guns/Weapons: No data recorded ?Are These Weapons Safely Secured?                            No data recorded ?Who Could Verify You Are Able To Have These Secured: No data recorded ?Do You Have any Outstanding Charges, Pending Court Dates, Parole/Probation? No data recorded ?Contacted To Inform of Risk of Harm To Self or Others: Unable to Contact: ? ? ? ?Does Patient Present under Involuntary Commitment? No ? ?IVC Papers Initial File Date: No data recorded ? ?[place] of Residence: Kathleen Argue ? ? ?Patient Currently Receiving the Following Services: ACTT (Assertive Community  Treatment) ? ? ?Determination of Need: Urgent (48 hours) ? ? ?Options For Referral: [facility]; Outpatient Therapy; Medication Management; Inpatient Hospitalization ? ? ? ? ?CCA Biopsychosocial ?Patient Reported Schizophrenia/Schizoaffective Diagnosis in Past: Yes ? ? ?Strengths: Pt is linked to [facility] for ACTT services. ? ? ?Mental Health Symptoms ?Depression:   ?Difficulty Concentrating; Irritability; Sleep (too much or little) ?  ?Duration of Depressive symptoms:  ?Duration of Depressive Symptoms: Greater than two weeks ?  ?Mania:   ?Change in energy/activity; Irritability ?  ?Anxiety:    ?Worrying; Tension; Sleep; Restlessness; Difficulty concentrating ?  ?Psychosis:   ?Other negative symptoms ?  ?Duration of Psychotic symptoms:  ?Duration of Psychotic Symptoms: Greater than six months ?  ?Trauma:   ?None ?  ?Obsessions:   ?-- (Unable to assess due to Pt's mental status) ?  ?Compulsions:   ?-- (Unable to assess due to Pt's mental status) ?  ?Inattention:   ?N/A ?  ?Hyperactivity/Impulsivity:   ?N/A ?  ?Oppositional/Defiant Behaviors:   ?N/A ?  ?Emotional Irregularity:   ?None ?  ?Other Mood/Personality Symptoms:   ?None noted ?  ? ?Mental Status Exam ?Appearance and self-care  ?Stature:   ?Average ?  ?Weight:   ?Average weight ?  ?Clothing:   ?Disheveled; Casual ?  ?Grooming:   ?  Normal ?  ?Cosmetic use:   ?None ?  ?Posture/gait:   ?Tense ?  ?Motor activity:   ?Restless ?  ?Sensorium  ?Attention:   ?Distractible ?  ?Concentration:   ?Scattered ?  ?Orientation:   ?X5 ?  ?Recall/memory:   ?-- (Unable to assess due to Pt's mental status) ?  ?Affect and Mood  ?Affect:   ?Anxious ?  ?Mood:   ?Anxious ?  ?Relating  ?Eye contact:   ?Fleeting ?  ?Facial expression:   ?Constricted; Anxious; Tense ?  ?Attitude toward examiner:   ?Cooperative; Uninterested ?  ?Thought and Language  ?Speech flow:  ?Paucity ?  ?Thought content:   ?Suspicious ?  ?Preoccupation:   ?-- (Unable to assess due to Pt's mental status) ?   ?Hallucinations:   ?-- (Unable to assess due to Pt's mental status) ?  ?Organization:  No data recorded  ?Executive Functions  ?Fund of Knowledge:   ?Poor ?  ?Intelligence:   ?Needs investigation ?  ?Abstraction:   ?Concrete ?  ?Judgement:

## 2022-01-10 NOTE — ED Notes (Signed)
Pt threw away his d/c papers he said he does not need them. As he was walking out he also quoted "I am fucked". I asked him what did he mean by that and he said nothing and laughed. I offered him a bus ticket and he said no  He was escorted to [place], mining watched as he went in children's area then he went out the lobby doors and walked towards the DSS building  ?

## 2022-01-10 NOTE — Progress Notes (Signed)
?   01/10/22 0320  ?BHUC Triage Screening (Walk-ins at [facility] only)  ?How Did You Hear About [place]? Other (Comment) ?Mudlogger)  ?What Is the Reason for Your Visit/Call Today? Pt was brought to [facility] voluntarily by Patent examiner. He appears paranoid and hypervigilant. He cannot say why he is here and answers most questions with "I don't know." He is restless and makes bizarre gestures and sometimes whispers. He denies suicidal ideation or homicidal ideation. When asked if he is experiencing hallucinations or has used substances, he responds "I don't know."  ?How Long Has This Been Causing You Problems? > than 6 months  ?Have You Recently Had Any Thoughts About Hurting Yourself? No  ?Are You Planning to Commit Suicide/Harm Yourself At This time? No  ?Have you Recently Had Thoughts About Hurting Someone [name]? No  ?Are You Planning To Harm Someone At This Time? No  ?Are you currently experiencing any auditory, visual or other hallucinations?  ?(Pt says "I don't know.")  ?Have You Used Any Alcohol or Drugs in the Past 24 Hours? Yes  ?How long ago did you use Drugs or Alcohol? Pt says "I don't know."  ?What Did You Use and How Much? Pt says "I don't know."  ?Do you have any current medical co-morbidities that require immediate attention? No  ?Clinician description of patient physical appearance/behavior: Pt is somewhat disheveled, restless, and repeatedly places his hands on his head or over his eyes.  ?What Do You Feel Would Help You the Most Today?  ?(Pt says "I don't know.")  ?If access to [facility] Urgent Care was not available, would you have sought care in the Emergency Department? Yes  ?Determination of Need Urgent (48 hours)  ?Options For Referral [facility] Urgent Care;Outpatient Therapy;Medication Management;Inpatient Hospitalization  ? ? ?

## 2022-01-10 NOTE — ED Provider Notes (Signed)
Behavioral Health Urgent Care Medical Screening Exam ? ?Patient Name: [name] ?MRN: [id number] ?Date of Evaluation: 01/10/22 ?Chief Complaint:   ?Diagnosis:  ?Final diagnoses:  ?Schizophrenia, paranoid (HCC)  ? ? ?[name] is a 31 y.o. male with a history of schizophrenia-parnoida who presents to [facility] voluntarily with law enforcement due to bizarre behaviors per [employer]. Patient is well know to behavioral health, area emergency departments, and this provider.  ? ?On evaluation, patient is alert. He is cooperative, but appears anxious and paranoid. Patient's behviors are more bizarre than usaul. He paces, makes sudden movements, and is holding his hands over his ears. He primarily responds "I don't know Man" when asked most questions, which is not unusual for [name]. He denies auditory and visual hallucinations. Responds "I don't know man" when asked about paranoia. However, as mentioned above patient is displaying bizarre behaviors and appears to be paranoid or RIS. He denies suicidal ideations. Denies homicidal ideations.  ? ?Patient initially agreed to continuous assessment. However, he later changed his mind prior to being placed on the unit. Attempted to persuade patient to remain at [facility], he states "You're good man, I'm just aggravated." At this time, he is not an imminent risk to himself or others and does not meet IVC criteria.  ? ?Psychiatric Specialty Exam ? ?Presentation  ?General Appearance:Fairly Groomed ? ?Eye Contact:Minimal ? ?Speech:Clear and Coherent ? ?Speech Volume:Normal ? ?Handedness:Left ? ? ?Mood and Affect  ?Mood:Anxious ? ?Affect:Blunt ? ? ?Thought Process  ?Thought Processes:Coherent ? ?Descriptions of Associations:Intact ? ?Orientation:Full (Time, Place and Person) ? ?Thought Content:Paranoid Ideation ? Diagnosis of Schizophrenia or Schizoaffective disorder in past: Yes ? Duration of Psychotic Symptoms: Greater than six months ? Hallucinations:None ?denies ? ?Ideas of Reference:Other  (comment) (states "I don't know") ? ?Suicidal Thoughts:No ? ?Homicidal Thoughts:No ? ? ?Sensorium  ?Memory:Immediate Fair; Recent Fair ? ?Judgment:Intact ? ?Insight:Lacking ? ? ?Executive Functions  ?Concentration:Poor ? ?Attention Span:Poor ? ?Recall:Fair ? ?Fund of Knowledge:Fair ? ?Language:Fair ? ? ?Psychomotor Activity  ?Psychomotor Activity:Restlessness ? ? ?Assets  ?Assets:Physical Health; Social Support ? ? ?Sleep  ?Sleep:-- (patient states "I don't know") ? ?Number of hours: -1 ? ? ?No data recorded ? ?Physical Exam: ?Physical Exam ?Constitutional:   ?   General: He is not in acute distress. ?   Appearance: He is not ill-appearing, toxic-appearing or diaphoretic.  ?HENT:  ?   Head: Normocephalic.  ?   Right Ear: External ear normal.  ?   Left Ear: External ear normal.  ?Eyes:  ?   Pupils: Pupils are equal, round, and reactive to light.  ?Cardiovascular:  ?   Rate and Rhythm: Normal rate.  ?Pulmonary:  ?   Effort: Pulmonary effort is normal. No respiratory distress.  ?Musculoskeletal:     ?   General: Normal range of motion.  ?Skin: ?   General: Skin is warm and dry.  ?Neurological:  ?   Mental Status: He is alert and oriented to person, place, and time.  ?Psychiatric:     ?   Mood and Affect: Mood is anxious. Affect is blunt.     ?   Thought Content: Thought content does not include homicidal or suicidal ideation. Thought content does not include suicidal plan.  ? ?Review of Systems  ?Constitutional:  Negative for chills and fever.  ?Respiratory:  Negative for cough and shortness of breath.   ?Cardiovascular:  Negative for chest pain and palpitations.  ?Gastrointestinal:  Negative for diarrhea, nausea and vomiting.  ?Neurological:  Negative  for dizziness and seizures.  ?Psychiatric/Behavioral:  Negative for depression, hallucinations, memory loss and suicidal ideas.   ?All other systems reviewed and are negative. ? ?Blood pressure (!) 138/104, pulse 84, temperature 97.6 ?F (36.4 ?C), temperature source  Oral, resp. rate 18, SpO2 98 %. There is no height or weight on file to calculate BMI. ? ?Musculoskeletal: ?Strength & Muscle Tone: within normal limits ?Gait & Station: normal ?Patient leans: N/A ? ? ?[facility] MSE Discharge Disposition for Follow up and Recommendations: ?Based on my evaluation the patient does not appear to have an emergency medical condition and can be discharged with resources and follow up care in outpatient services for Medication Management and Individual Therapy ? ?Follow up with ACT team ? ? ?Jackelyn Poling, NP ?01/10/2022, 4:00 AM ? ?

## 2022-01-12 ENCOUNTER — Other Ambulatory Visit: Payer: Self-pay

## 2022-01-12 ENCOUNTER — Encounter (HOSPITAL_COMMUNITY): Payer: Self-pay

## 2022-01-12 ENCOUNTER — Emergency Department (HOSPITAL_COMMUNITY)
Admission: EM | Admit: 2022-01-12 | Discharge: 2022-01-12 | Disposition: A | Discharge status: Home / Self Care | Payer: Medicaid Other | Attending: Emergency Medicine | Admitting: Emergency Medicine

## 2022-01-12 DIAGNOSIS — F99 Mental disorder, not otherwise specified: Secondary | ICD-10-CM

## 2022-01-12 DIAGNOSIS — Z0279 Encounter for issue of other medical certificate: Secondary | ICD-10-CM | POA: Diagnosis present

## 2022-01-12 DIAGNOSIS — F419 Anxiety disorder, unspecified: Secondary | ICD-10-CM

## 2022-01-12 DIAGNOSIS — F200 Paranoid schizophrenia: Secondary | ICD-10-CM | POA: Insufficient documentation

## 2022-01-12 MED ORDER — OLANZAPINE 5 MG PO TBDP
5.0000 mg | ORAL_TABLET | Freq: Once | ORAL | Status: AC
  Administered 2022-01-12: 5 mg via ORAL
  Filled 2022-01-12: qty 1

## 2022-01-12 NOTE — Discharge Instructions (Addendum)
You were seen in the emergency department requesting medication.  ? ?We have given you one dose of Zyprexa.  ? ?Please follow up with [facility].  ?

## 2022-01-12 NOTE — ED Notes (Signed)
Pt given ham sandwich and apple sauce

## 2022-01-12 NOTE — ED Provider Notes (Signed)
?[facility]-EMERGENCY DEPT ?Provider Note ? ? ?CSN: [id number] ?Arrival date & time: 01/12/22  0707 ? ?  ? ?History ? ?Chief Complaint  ?Patient presents with  ? Medical Clearance  ? Leg Problem  ? ? ?[name] is a 31 y.o. male who presents emergency department for medical clearance. Patient states he requires medications to "chill out". When asked what brings him to the department he states "I don't know". Per EMS, patient reported to them that he has not been on his psychiatric medications "for a while". Patient is very well known to this department, most recently seen on 4/2 and discharged with outpatient follow up and new prescription for Zyprexa. Patient did not have this filled and it was discontinued at behavioral health urgent care yesterday. Patient denies SI, HI, AVH.  ? ?HPI ? ?  ? ?Home Medications ?Prior to Admission medications   ?Medication Sig Start Date End Date Taking? Authorizing Provider  ?paliperidone (INVEGA) 6 MG 24 hr tablet Take 1 tablet (6 mg total) by mouth daily for 14 days. ?Patient not taking: No sig reported 01/02/21 02/22/21  Mare Ferrari, PA-C  ?   ? ?Allergies    ?Shellfish allergy and Bee pollen   ? ?Review of Systems   ?Review of Systems  ?Psychiatric/Behavioral:  Positive for behavioral problems. Negative for hallucinations and suicidal ideas. The patient is not nervous/anxious.   ?All other systems reviewed and are negative. ? ?Physical Exam ?Updated Vital Signs ?BP 119/76 (BP Location: Left Arm)   Pulse 95   Temp (!) 97.5 ?F (36.4 ?C) (Oral)   Resp 18   Ht 5\' 10"  (1.778 m)   Wt 74.8 kg   SpO2 100%   BMI 23.68 kg/m?  ?Physical Exam ?Vitals and nursing note reviewed.  ?Constitutional:   ?   Appearance: Normal appearance.  ?   Comments: Comfortably sleeping in exam bed. Arousable to verbal stimuli.  ?HENT:  ?   Head: Normocephalic and atraumatic.  ?Eyes:  ?   Conjunctiva/sclera: Conjunctivae normal.  ?Pulmonary:  ?   Effort: Pulmonary effort is  normal. No respiratory distress.  ?Skin: ?   General: Skin is warm and dry.  ?Neurological:  ?   Mental Status: He is alert.  ?Psychiatric:     ?   Mood and Affect: Mood normal.     ?   Behavior: Behavior normal.  ?   Comments: Continues responding "I don't know" when asked open ended questions. Denies SI, HI, auditory or visual hallucinations. Denies depression or anxiety.  ? ? ?ED Results / Procedures / Treatments   ?Labs ?(all labs ordered are listed, but only abnormal results are displayed) ?Labs Reviewed - No data to display ? ?EKG ?None ? ?Radiology ?No results found. ? ?Procedures ?Procedures  ? ? ?Medications Ordered in ED ?Medications  ?OLANZapine zydis (ZYPREXA) disintegrating tablet 5 mg (5 mg Oral Given 01/12/22 0802)  ? ? ?ED Course/ Medical Decision Making/ A&P ?  ?                        ?Medical Decision Making ?Risk ?Prescription drug management. ? ? ?This patient is a 31 year old male who presents to the ED for concern of psychiatric evaluation. Denies suicidal ideation, homicidal ideation, visual or auditory hallucinations.  Denies depression or anxiety.  Has no complaints at this time other than requesting "medication to chill out". ? ?Past Medical History / Co-morbidities / Social History: ?Paranoid schizophrenia,  depression, homelessness ? ?Physical Exam: ?Physical exam performed. The pertinent findings include: Patient sleeping comfortably in exam bed.  Answers "I do not know" to any open-ended question.   ? ?Medications: ?Patient given one dose of previously prescribed zyprexa. States he feels more calm.  ?  ?Disposition: ?After consideration of the diagnostic results and the patients response to treatment, I feel that patient is not requiring admission or inpatient treatment for his symptoms. Encouraged him to follow up with [facility]. Does not meet IVC criteria. Discussed reasons to return to the emergency department, and the patient is agreeable to the plan. ? ?Portions of  this report may have been transcribed using voice recognition software. Every effort was made to ensure accuracy; however, inadvertent computerized transcription errors may be present.  ? ?Final Clinical Impression(s) / ED Diagnoses ?Final diagnoses:  ?Anxiety  ?Psychiatric complaint  ?Paranoid schizophrenia (HCC)  ? ? ?Rx / DC Orders ?ED Discharge Orders   ? ? None  ? ?  ? ? ?  ?Su Monks, PA-C ?01/12/22 0935 ? ?  ?Melene Plan, DO ?01/12/22 1114 ? ?

## 2022-01-12 NOTE — ED Triage Notes (Signed)
Pt bib ems for leg weakness and states to ems "I need my meds to chill out".  Pt states "I don't know" when asked questions about pain or where he is.  Per ems, pt stated to them that he has not been on this meds for "awhile".  Pt calm and cooperative in upon arrival to ed bed. ?

## 2022-01-15 ENCOUNTER — Emergency Department (HOSPITAL_COMMUNITY)
Admission: EM | Admit: 2022-01-15 | Discharge: 2022-01-15 | Disposition: A | Discharge status: Home / Self Care | Payer: Medicaid Other | Attending: Emergency Medicine | Admitting: Emergency Medicine

## 2022-01-15 ENCOUNTER — Other Ambulatory Visit: Payer: Self-pay

## 2022-01-15 ENCOUNTER — Encounter (HOSPITAL_COMMUNITY): Payer: Self-pay | Admitting: Emergency Medicine

## 2022-01-15 ENCOUNTER — Ambulatory Visit (HOSPITAL_COMMUNITY)
Admission: EM | Admit: 2022-01-15 | Discharge: 2022-01-15 | Disposition: A | Discharge status: Home / Self Care | Payer: Medicaid Other | Source: Home / Self Care

## 2022-01-15 DIAGNOSIS — F32A Depression, unspecified: Secondary | ICD-10-CM | POA: Insufficient documentation

## 2022-01-15 DIAGNOSIS — G8929 Other chronic pain: Secondary | ICD-10-CM | POA: Diagnosis not present

## 2022-01-15 DIAGNOSIS — F419 Anxiety disorder, unspecified: Secondary | ICD-10-CM | POA: Diagnosis present

## 2022-01-15 DIAGNOSIS — Z01818 Encounter for other preprocedural examination: Secondary | ICD-10-CM | POA: Insufficient documentation

## 2022-01-15 DIAGNOSIS — Z5900 Homelessness unspecified: Secondary | ICD-10-CM | POA: Insufficient documentation

## 2022-01-15 DIAGNOSIS — F200 Paranoid schizophrenia: Secondary | ICD-10-CM | POA: Insufficient documentation

## 2022-01-15 DIAGNOSIS — M545 Low back pain, unspecified: Secondary | ICD-10-CM | POA: Diagnosis not present

## 2022-01-15 DIAGNOSIS — F22 Delusional disorders: Secondary | ICD-10-CM | POA: Insufficient documentation

## 2022-01-15 LAB — SALICYLATE LEVEL: Salicylate Lvl: <7 mg/dL — ABNORMAL LOW (ref 7.0–30.0)

## 2022-01-15 LAB — RAPID URINE DRUG SCREEN, HOSP PERFORMED
Amphetamines: NOT DETECTED
Barbiturates: NOT DETECTED
Benzodiazepines: NOT DETECTED
Cocaine: NOT DETECTED
Opiates: NOT DETECTED
Tetrahydrocannabinol: NOT DETECTED

## 2022-01-15 LAB — CBC
HCT: 45.3 % (ref 39.0–52.0)
Hemoglobin: 15.1 g/dL (ref 13.0–17.0)
MCH: 30.8 pg (ref 26.0–34.0)
MCHC: 33.3 g/dL (ref 30.0–36.0)
MCV: 92.4 fL (ref 80.0–100.0)
Platelets: 262 10*3/uL (ref 150–400)
RBC: 4.9 MIL/uL (ref 4.22–5.81)
RDW: 12.6 % (ref 11.5–15.5)
WBC: 7 10*3/uL (ref 4.0–10.5)
nRBC: 0 % (ref 0.0–0.2)

## 2022-01-15 LAB — COMPREHENSIVE METABOLIC PANEL
ALT: 17 U/L (ref 0–44)
AST: 25 U/L (ref 15–41)
Albumin: 4.3 g/dL (ref 3.5–5.0)
Alkaline Phosphatase: 44 U/L (ref 38–126)
Anion gap: 6 (ref 5–15)
BUN: 16 mg/dL (ref 6–20)
CO2: 27 mmol/L (ref 22–32)
Calcium: 9.4 mg/dL (ref 8.9–10.3)
Chloride: 106 mmol/L (ref 98–111)
Creatinine, Ser: 1.02 mg/dL (ref 0.61–1.24)
GFR, Estimated: >60 mL/min (ref >60)
Glucose, Bld: 91 mg/dL (ref 70–99)
Potassium: 4.2 mmol/L (ref 3.5–5.1)
Sodium: 139 mmol/L (ref 135–145)
Total Bilirubin: 0.7 mg/dL (ref 0.3–1.2)
Total Protein: 6.7 g/dL (ref 6.5–8.1)

## 2022-01-15 LAB — ACETAMINOPHEN LEVEL: Acetaminophen (Tylenol), Serum: <10 ug/mL — ABNORMAL LOW (ref 10–30)

## 2022-01-15 LAB — ETHANOL: Alcohol, Ethyl (B): <10 mg/dL (ref <10)

## 2022-01-15 MED ORDER — ACETAMINOPHEN 500 MG PO TABS
1000.0000 mg | ORAL_TABLET | Freq: Once | ORAL | Status: DC
  Filled 2022-01-15: qty 2

## 2022-01-15 NOTE — Progress Notes (Signed)
?   01/15/22 0707  ?BHUC Triage Screening (Walk-ins at [facility] only)  ?How Did You Hear About [place]? Self  ?What Is the Reason for Your Visit/Call Today? [name] is a 31 year old male presenting as a voluntary walk-in to [facility]. Patient appears to be restless at times during assessment. Patient denied SI, HI and alcohol/drug usage. When asked if he is experiencing hallucinations or has used substances, he responds "I don't know." When asked, why are you here, patient states "somewhere to stay". Patient responds to most questions with "I don't know". Patient medical record indicates he receives ACTT services through [facility], however patient denied receiving medication management or therapy. Patient contracts for safety.  ?How Long Has This Been Causing You Problems? <Week  ?Have You Recently Had Any Thoughts About Hurting Yourself? No  ?Are You Planning to Commit Suicide/Harm Yourself At This time? No  ?Have you Recently Had Thoughts About Hurting Someone [name]? No  ?Are You Planning To Harm Someone At This Time? No  ?Are you currently experiencing any auditory, visual or other hallucinations? No  ?Have You Used Any Alcohol or Drugs in the Past 24 Hours? No  ?How long ago did you use Drugs or Alcohol? denied  ?What Did You Use and How Much? denied  ?Do you have any current medical co-morbidities that require immediate attention? No  ?Clinician description of patient physical appearance/behavior: calm / cooperative  ?What Do You Feel Would Help You the Most Today? Stress Management  ?If access to [facility] Urgent Care was not available, would you have sought care in the Emergency Department? No  ?Determination of Need Routine (7 days)  ?Options For Referral Medication Management;Outpatient Therapy  ? ? ?

## 2022-01-15 NOTE — Discharge Instructions (Signed)
Fortunately your labs are normal.  Please follow-up with your doctor for further care.  Use resource guide below for assistance as needed. ?

## 2022-01-15 NOTE — Discharge Instructions (Signed)
Patient is instructed prior to discharge to: Take all medications as prescribed by his/her mental healthcare provider. ?Report any adverse effects and or reactions from the medicines to his/her outpatient provider promptly. ?Patient has been instructed & cautioned: To not engage in alcohol and or illegal drug use while on prescription medicines. ?In the event of worsening symptoms, patient is instructed to call the crisis hotline, 911 and or go to the nearest ED for appropriate evaluation and treatment of symptoms. ?To follow-up with his/her primary care provider for your other medical issues, concerns and or health care needs. ?Substance Abuse Treatment Programs ? ?Intensive Outpatient Programs ?[facility]     ?[address]      ?[place], [place]                   ?[phone]      ? ?[facility] ?[address] ?[place], [place] ?[phone] ? ?[facility]     ?(Inpatient and outpatient)     ?[address]           ?[phone]   ? ?[facility] ?[phone] (Suboxone and Methadone) ? ?[address]      ?[place], [place] [address]      ?[phone]      ? ?[address] ?[place], [place] ?[phone] ? ?[facility] (Outpatient/Inpatient, [facility])    ?(insurance only) [phone]      ?       ?[facility] (Groups & Residential) ?[place], [place] ?[phone] ? ?   ?[facility]     ?[address]     ?[place], [place]      ?[phone]      ? ?[facility] (for caregivers and family) ?[address] ?[place], [place] ?[phone] ? ? ? ? ? ?Residential Treatment Programs ?[facility]      ?[address], [place], [place] [address]  ?[phone]      ? ?[facility] ?[address] , [place] [address] ?[phone] ? ?[facility]        ?[phone]      ? ?[facility] ?[phone] ? ?[facility]             ?[address]                                          ?[place], [place]                                                ?[phone] or [phone]                              ? ?[facility] [place] ?[address] ?[place], [address] ?[phone] ? ?[facility]    ?[address]      ?[place], [place]     ?[phone]      ? ?[facility] ?[address] ?,  ?[phone] ? ?[facility]   ?[address]     ?[place],  [address]     ?[phone]      ?  Admissions: 8am-3pm M-F ? ?[facility] ([facility] ?[address] ?[place], [place] ?[phone] ? ?[facility]: Residential Program (90 Days)   ?[place], [place]      ?[phone] or [phone]    ? ?[facility]: [facility] ?[place], [place] ?(Walk in Hours over the weekend or by referral) ? ?[place] [facility] ?[address], [place], [place] [address] ?[phone] ? ?Crisis Mobile: [facility]:  1-877-626-1772 (for crisis response 24 hours a day) ?[facility] Hotline:      1-800-256-2452 ?Outpatient Psychiatry and Counseling ? ?[facility]: Mobile Crisis Management 24 hours:  1-877-626-1772 ? ?[facility] sliding scale fee and walk in schedule: M-F 8am-12pm/1pm-3pm ?[address]  ?[place], [place] [address] ?[phone] ? ?[facility] ?[address] ?[place], [place] [address] ?[phone] ? ?[facility] ([facility] new patient walk-in appointments available Monday - Friday 8am -3pm.          ?[address] ?[place], [place] [address] ?[phone] or crisis line- [phone] ? ?[facility]/ Intensive Outpatient Therapy Program ?[address] ?[place], [place] [address] ?[phone] ? ?[facility]                  ?Crisis Services      ?[phone]      ?[address]     ?[place], [place]  [address]                ? ?[facility]   ?[facility] ?[phone] ?[address] ?[place], [place] [address] ? ? ?[facility]          ?[address],  ?[place], [place] [address]       ?[phone] ? ?[facility] ?[address] ?[place], [place] [address] ?[phone] ? ?[facility]    ?[address]    ?[place], [place] [address]     ?[phone]      ? ?Parish McKinney, MD     ?[address]     ?[place] [address]     ?[phone]     ?  ?[facility] ?[address] ?[place] [address] ? ?[facility]     ?[address]     ?[place], [place]      ?[phone]      ? ?[facility] ?Shamsher Ahluwalia, MD ?[address] ?[place], [place] [address] ?[phone] ? ?[facility]     ?[address]     ?[place], [place] [address]     ?[phone]      ? ?[facility] ?[address] ?[place], [place] [address] ?[phone] ?Resources for Temporary Residential Assistance/Crisis Centers ? ?DAY CENTERS ?[facility] ?M-F 8am-3pm   ?[address] [place], [place] [address]   [phone] ?Services include: laundry, barbering, support groups, case management, phone  & computer access, showers, AA/NA mtgs, mental health/substance abuse nurse, job skills class, disability information, VA assistance, spiritual classes, etc.  ? ?HOMELESS SHELTERS ? ?[facility]     ?[facility]   ?[address], [place]     ?[phone]       ?       ?[facility] (women and children)       ?[address] ?,    [address] ?[phone] ?[email or web address] for application and process ?Application Required ? ?[facility]   ?[address]    ?[place] [address]     ?[phone]       ?             ?[facility] ?[address] ?[place], [place] [address] ?[phone] ?[phone](schedule application appt.) ?Application Required ? ?[facility] (women only)    ?[address]     ?[place], [place] [address]     ?[phone]      ?Intake starts 6pm daily ?Need valid ID, SSC, & Police report ?[facility] [place] ?[address] ?[place], [place] ?[phone] ?Application Required ? ?[facility] (men only)     ?[address]      ?[place], [place]     ?[phone]      ? ?[facility] ?(Pregnant women only) ?[address] ?[place], [place] ?[phone] ? ?[facility]      ?[address]      ?[place], [place] [address]     ?[phone]      ?       ?[place] [facility] ?[address] ?[place], [place] ?[phone] ?90 day commitment/SA/Application process ? ?[facility](men only)     ?[address]     ?[place], [place]     ?[phone]       ?Check-in at 7pm     ?       ?[facility] ?[address] ?[place],  [address] ?[phone] ?Men/Women/Women and Children must be there by 7 pm ? ?[facility] ?[place],  ?[phone]                ? ?   ?

## 2022-01-15 NOTE — ED Triage Notes (Signed)
Pt brought to the ED by [employer] stating he needs his meds and "chill pill." Pt states that he is scared for his life. ?

## 2022-01-15 NOTE — Progress Notes (Signed)
Discharge note. Order received for pt discharge. Avs copy printed for patient along with [facility] resource and bus pass. Patient accepted bus pass but refused to take AVS paperwork.  ?

## 2022-01-15 NOTE — ED Provider Notes (Signed)
?[name] EMERGENCY DEPARTMENT ?Provider Note ? ? ?CSN: [id number] ?Arrival date & time: 01/15/22  0403 ? ?  ? ?History ? ?Chief Complaint  ?Patient presents with  ? Medical Clearance  ? ? ?[name] is a 31 y.o. male. ? ?The history is provided by the patient, the police and medical records. No language interpreter was used.  ? ?31 year old male with significant history of schizophrenia, homelessness, malingering, polysubstance abuse, brought here via [employer] for evaluation of mood disorder.  Per triage note, patient stating he needs his meds and "chill pill" and that he was scared for his life.  However at this time patient is resting, does not have any specific complaint except for some lower back pain that appears to be chronic.  He denies any recent injury, denies SI HI denies any chest pain, denies any auditory or visual hallucination. ? ?Home Medications ?Prior to Admission medications   ?Medication Sig Start Date End Date Taking? Authorizing Provider  ?paliperidone (INVEGA) 6 MG 24 hr tablet Take 1 tablet (6 mg total) by mouth daily for 14 days. ?Patient not taking: No sig reported 01/02/21 02/22/21  Mare Ferrari, PA-C  ?   ? ?Allergies    ?Shellfish allergy and Bee pollen   ? ?Review of Systems   ?Review of Systems  ?All other systems reviewed and are negative. ? ?Physical Exam ?Updated Vital Signs ?BP (!) 143/91 (BP Location: Right Arm)   Pulse 67   Temp (!) 97.4 ?F (36.3 ?C) (Oral)   Resp 17   SpO2 99%  ?Physical Exam ?Vitals and nursing note reviewed.  ?Constitutional:   ?   General: He is not in acute distress. ?   Appearance: He is well-developed.  ?HENT:  ?   Head: Atraumatic.  ?Eyes:  ?   Conjunctiva/sclera: Conjunctivae normal.  ?Musculoskeletal:  ?   Cervical back: Neck supple.  ?Skin: ?   Findings: No rash.  ?Neurological:  ?   Mental Status: He is alert.  ?   GCS: GCS eye subscore is 4. GCS verbal subscore is 5. GCS motor subscore is 6.  ?Psychiatric:     ?   Mood and  Affect: Affect is flat.     ?   Speech: Speech normal.     ?   Behavior: Behavior is cooperative.     ?   Thought Content: Thought content does not include homicidal or suicidal ideation.  ? ? ?ED Results / Procedures / Treatments   ?Labs ?(all labs ordered are listed, but only abnormal results are displayed) ?Labs Reviewed  ?CBC  ?COMPREHENSIVE METABOLIC PANEL  ?ETHANOL  ?SALICYLATE LEVEL  ?ACETAMINOPHEN LEVEL  ?RAPID URINE DRUG SCREEN, HOSP PERFORMED  ? ? ?EKG ?None ? ?Radiology ?No results found. ? ?Procedures ?Procedures  ? ? ?Medications Ordered in ED ?Medications - No data to display ? ?ED Course/ Medical Decision Making/ A&P ?  ?                        ?Medical Decision Making ?Amount and/or Complexity of Data Reviewed ?Labs: ordered. ? ? ?BP (!) 143/91 (BP Location: Right Arm)   Pulse 67   Temp (!) 97.4 ?F (36.3 ?C) (Oral)   Resp 17   SpO2 99%  ? ?4:49 AM ?This is a 31 year old male with history of schizophrenia and history of malingering brought here by [employer] due to initial complaints of "needing my chill pill".  However at this time patient  is without any specific complaint aside from having some low back pain without any recent injury.  No significant tenderness to palpation of his spine.  Patient moving without difficulty. ? ?5:19 AM ?Labs obtained independently viewed and interpreted by me and are unremarkable.  UDS without signs of drug use, normal electrolyte panel, normal H&H, alcohol level undetectable.  At this time patient is resting comfortably and is stable for discharge.  Outpatient follow-up with his psychiatry team is recommended. ? ? ?This patient presents to the ED for concern of anxiety, this involves an extensive number of treatment options, and is a complaint that carries with it a high risk of complications and morbidity.  The differential diagnosis includes psychosis, anxiety, panic attack, paranoia, metabolic derangement, infection ? ?Co morbidities that complicate the patient  evaluation ?schizophrenia ? malingering ?Additional history obtained: ? ?Additional history obtained from [employer] ?External records from outside source obtained and reviewed including prior ER notes ? ?Lab Tests: ? ?I Ordered, and personally interpreted labs.  The pertinent results include:  reassuring labs ? ? ?I have reviewed the patients home medicines and have made adjustments as needed ? ? ?Problem List / ED Course: ?paranoia ? anxiety ? ?Reevaluation: ? ?After the interventions noted above, I reevaluated the patient and found that they have :improved ? ?Social Determinants of Health: ?psychiatric illness ? ?Dispostion: ? ?After consideration of the diagnostic results and the patients response to treatment, I feel that the patent would benefit from outpt f/u. ? ? ? ? ? ? ? ? ?Final Clinical Impression(s) / ED Diagnoses ?Final diagnoses:  ?Anxiety  ? ? ?Rx / DC Orders ?ED Discharge Orders   ? ? None  ? ?  ? ? ?  ?Fayrene Helper, PA-C ?01/15/22 0531 ? ?  ?Dione Booze, MD ?01/15/22 [phone] ? ?

## 2022-01-15 NOTE — Progress Notes (Signed)
[name] received his AVS, questions answered and he retrieved his personal belongings. ?

## 2022-01-15 NOTE — ED Provider Notes (Signed)
Behavioral Health Urgent Care Medical Screening Exam ? ?Patient Name: [name] ?MRN: [id number] ?Date of Evaluation: 01/15/22 ?Chief Complaint:   ?Diagnosis:  ?Final diagnoses:  ?Schizophrenia, paranoid type (HCC)  ? ? ?History of Present illness: [name] is a 31 y.o. male patient presented to [facility] as a walk in alone with complaints of "I need somewhere to stay".  ? ?[name], 31 y.o., male patient seen face to face by this provider, consulted with Dr. Gasper Sells; and chart reviewed on 01/15/22. Per chart review patient has a past psychiatric history of schizophrenia, paranoid type, homelessness, malingering, and polysubstance abuse.Marland Kitchen He is well known to the psychiatric service. He frequents this department and the area emergency rooms. OF note he was seen in the [facility] emergency depart this morning at 0400 requesting a "chill pill". He was discharged and then presented to [facility].  ? ?On evaluation [name] reports he had services with [facility] ACTT team, but states he has not talked to them in a while. This Clinical research associate attempted to contact [facility] at [phone] and was unsuccessful. Reports he does not take any medications. He denies any substance use.   ? ?During evaluation [name] is observed sleeping in a chair. He is easily awakened. He is  alert/oriented x 4 and cooperative. His speech is at a clear tone,moderate volume, and normal pace; with fair eye contact. States he doesn't eat much, but mainly because he has limited access to food.States he is homeless and has been unable to sleep, because it is difficult to sleep on the streets. He endorses paranoia which appears to be his baseline. States he cant rest because he worries people are watching him. Discussed medications that can help with paranoia and he refused. He endorses anxiety and depression, with a congruent affect. He denies AVH and objectively he does not appear to be responding to internal/external stimuli or experiencing  delusional thought content.   He denies SI/HI. He contracts for safety.   ? ?Provided out patient resources for medication management, therapy, half way houses, shelters, and [facility]. Provided patient with a bus pass.  ? ?At this time [name] is educated and verbalizes understanding of mental health resources and other crisis services in the community. He is instructed to call 911 and present to the nearest emergency room should he experience any suicidal/homicidal ideation, auditory/visual/hallucinations, or detrimental worsening of his mental health condition.  ?  ? ?Psychiatric Specialty Exam ? ?Presentation  ?General Appearance:Appropriate for Environment; Fairly Groomed ? ?Eye Contact:Fair ? ?Speech:Clear and Coherent; Normal Rate ? ?Speech Volume:Normal ? ?Handedness:Right ? ? ?Mood and Affect  ?Mood:Anxious ? ?Affect:Congruent ? ? ?Thought Process  ?Thought Processes:Coherent ? ?Descriptions of Associations:Intact ? ?Orientation:Full (Time, Place and Person) ? ?Thought Content:Logical ? Diagnosis of Schizophrenia or Schizoaffective disorder in past: Yes ? Duration of Psychotic Symptoms: Greater than six months ? Hallucinations:None ?denies ? ?Ideas of Reference:Paranoia ? ?Suicidal Thoughts:No ? ?Homicidal Thoughts:No ? ? ?Sensorium  ?Memory:Immediate Fair; Recent Fair; Remote Fair ? ?Judgment:Fair ? ?Insight:Fair ? ? ?Executive Functions  ?Concentration:Fair ? ?Attention Span:Fair ? ?Recall:Fair ? ?Fund of Knowledge:Fair ? ?Language:Good ? ? ?Psychomotor Activity  ?Psychomotor Activity:Normal ? ? ?Assets  ?Assets:Desire for Improvement; Financial Resources/Insurance; Leisure Time; Physical Health; Resilience ? ? ?Sleep  ?Sleep:Poor ? ?Number of hours: -1 ? ? ?No data recorded ? ?Physical Exam: ?Physical Exam ?Vitals and nursing note reviewed.  ?Constitutional:   ?   Appearance: Normal appearance. He is well-developed.  ?HENT:  ?  Head: Normocephalic and atraumatic.  ?Eyes:  ?   General:     ?   Right eye:  No discharge.     ?   Left eye: No discharge.  ?Cardiovascular:  ?   Rate and Rhythm: Normal rate.  ?Pulmonary:  ?   Effort: Pulmonary effort is normal. No respiratory distress.  ?   Breath sounds: Normal breath sounds.  ?Musculoskeletal:     ?   General: Normal range of motion.  ?   Cervical back: Normal range of motion.  ?Skin: ?   Coloration: Skin is not jaundiced or pale.  ?Neurological:  ?   Mental Status: He is alert and oriented to person, place, and time.  ?Psychiatric:     ?   Attention and Perception: Attention and perception normal.     ?   Mood and Affect: Mood is anxious and depressed.     ?   Speech: Speech normal.     ?   Behavior: Behavior normal.     ?   Thought Content: Thought content normal.     ?   Cognition and Memory: Cognition normal.     ?   Judgment: Judgment normal.  ? ?Review of Systems  ?Constitutional: Negative.   ?HENT: Negative.    ?Eyes: Negative.   ?Respiratory: Negative.    ?Cardiovascular: Negative.   ?Gastrointestinal: Negative.   ?Musculoskeletal: Negative.   ?Skin: Negative.   ?Neurological: Negative.   ?Psychiatric/Behavioral:  Positive for depression. The patient is nervous/anxious.   ?Blood pressure (!) 135/95, pulse 66, temperature 97.7 ?F (36.5 ?C), temperature source Oral, resp. rate 18, SpO2 100 %. There is no height or weight on file to calculate BMI. ? ?Musculoskeletal: ?Strength & Muscle Tone: within normal limits ?Gait & Station: normal ?Patient leans: N/A ? ? ?[facility] Discharge Disposition for Follow up and Recommendations: ?Based on my evaluation the patient does not appear to have an emergency medical condition and can be discharged with resources and follow up care in outpatient services for Medication Management and Individual Therapy ? ?Discharge patient  ? ?Resources provided for out patient medication management, therapy, half way houses, shelters, and [facility]. Provided patient with a bus pass.  ? ?No evidence of imminent risk to self or others at present.     ?Patient does not meet criteria for psychiatric inpatient admission. ?Discussed crisis plan, support from social network, calling 911, coming to the Emergency Department, and calling Suicide Hotline.  ? ? ?Ardis Hughs, NP ?01/15/2022, 8:20 AM ? ?

## 2022-01-16 ENCOUNTER — Ambulatory Visit (HOSPITAL_COMMUNITY)
Admission: EM | Admit: 2022-01-16 | Discharge: 2022-01-16 | Disposition: A | Discharge status: Home / Self Care | Payer: Medicaid Other | Attending: Nurse Practitioner | Admitting: Nurse Practitioner

## 2022-01-16 DIAGNOSIS — F200 Paranoid schizophrenia: Secondary | ICD-10-CM | POA: Insufficient documentation

## 2022-01-16 NOTE — Progress Notes (Signed)
?   01/16/22 0510  ?[place] Triage Screening (Walk-ins at [facility] only)  ?How Did You Hear About [place]? Self  ?What Is the Reason for Your Visit/Call Today? Pt has a diagnosis of schizophrenia and frequent presentations to emergency services. He was at [facility] less than 24 hour ago. This morning he says he needs a place to sleep. He requests something to eat and drink. He denies current suicidal ideation. He denies homicidal ideation. He denies experiencing auditory or visual hallucinations. He denies use of alcohol or other substances. He denies physical illness. Medical record indicates he receives ACTT services through [facility].  ?How Long Has This Been Causing You Problems? > than 6 months  ?Have You Recently Had Any Thoughts About Hurting Yourself? No  ?Are You Planning to Commit Suicide/Harm Yourself At This time? No  ?Have you Recently Had Thoughts About [place]? No  ?Are You Planning To Harm Someone At This Time? No  ?Are you currently experiencing any auditory, visual or other hallucinations? No  ?Have You Used Any Alcohol or Drugs in the Past 24 Hours? No  ?How long ago did you use Drugs or Alcohol? Pt denies.  ?What Did You Use and How Much? Pt denies.  ?Do you have any current medical co-morbidities that require immediate attention? No  ?Clinician description of patient physical appearance/behavior: Pt is casually dressed alert and oriented x4. He looks at his phone during most of assessment. Pt speaks in a mumbled tone, at low volume and normal pace. Motor behavior appears normal. Eye contact is fair. Pt's mood is euthymic and affect is blunted. Thought process is coherent and relevant. He is calm and cooperative.  ?What Do You Feel Would Help You the Most Today? Food Assistance;Stress Management  ?If access to [facility] was not available, would you have sought care in the Emergency Department? Yes  ?Determination of Need Routine (7 days)  ?Options For Referral Medication Management;Outpatient  Therapy  ? ? ?

## 2022-01-16 NOTE — Discharge Instructions (Signed)

## 2022-01-16 NOTE — ED Provider Notes (Addendum)
Behavioral Health Urgent Care Medical Screening Exam ? ?Patient Name: [name] ?MRN: [id number] ?Date of Evaluation: 01/16/22 ?Chief Complaint:   ?Diagnosis:  ?Final diagnoses:  ?Schizophrenia, paranoid (HCC)  ? ? ?History of Present illness: Ulrick Methot is a 31 y.o. male with a history of schizophrenia-paranoid type who presents voluntarily to [facility] requesting a place to sleep and something to eat. [place] is well known to behavioral health and this provider. On evaluation patient is alert and oriented x 4, calm, and cooperative. Speech is clear and coherent. Mood is anxious and affect is congruent with mood. Thought process is coherent and thought content is logical. Denies auditory and visual hallucinations. No indication that patient is responding to internal stimuli. No delusions elicited during this assessment. Denies suicidal ideations. Denies homicidal ideations. Denies use of alcohol, marijuana, and other substances.  ? ? ? ?Psychiatric Specialty Exam ? ?Presentation  ?General Appearance:Appropriate for Environment; Fairly Groomed ? ?Eye Contact:Fair ? ?Speech:Clear and Coherent; Normal Rate ? ?Speech Volume:Normal ? ?Handedness:Right ? ? ?Mood and Affect  ?Mood:Anxious ? ?Affect:Congruent ? ? ?Thought Process  ?Thought Processes:Coherent ? ?Descriptions of Associations:Intact ? ?Orientation:Full (Time, Place and Person) ? ?Thought Content:Logical ? Diagnosis of Schizophrenia or Schizoaffective disorder in past: Yes ? Duration of Psychotic Symptoms: Greater than six months ? Hallucinations:None ?denies ? ?Ideas of Reference:None ? ?Suicidal Thoughts:No ? ?Homicidal Thoughts:No ? ? ?Sensorium  ?Memory:Immediate Fair; Recent Fair; Remote Fair ? ?Judgment:Fair ? ?Insight:Fair ? ? ?Executive Functions  ?Concentration:Fair ? ?Attention Span:Fair ? ?Recall:Fair ? ?Fund of Knowledge:Fair ? ?Language:Fair ? ? ?Psychomotor Activity  ?Psychomotor Activity:Normal ? ? ?Assets  ?Assets:Financial Resources/Insurance;  Leisure Time; Physical Health; Resilience; Social Support ? ? ?Sleep  ?Sleep:Poor ? ?Number of hours: -1 ? ? ?Nutritional Assessment (For OBS and FBC admissions only) ?Has the patient had a weight loss or gain of 10 pounds or more in the last 3 months?: No ?Has the patient had a decrease in food intake/or appetite?: No ?Does the patient have dental problems?: No ?Does the patient have eating habits or behaviors that may be indicators of an eating disorder including binging or inducing vomiting?: No ?Has the patient recently lost weight without trying?: 0 ?Has the patient been eating poorly because of a decreased appetite?: 0 ?Malnutrition Screening Tool Score: 0 ? ? ? ?Physical Exam: ?Physical Exam ?Constitutional:   ?   General: He is not in acute distress. ?   Appearance: He is not ill-appearing, toxic-appearing or diaphoretic.  ?HENT:  ?   Head: Normocephalic.  ?   Right Ear: External ear normal.  ?   Left Ear: External ear normal.  ?Eyes:  ?   Pupils: Pupils are equal, round, and reactive to light.  ?Cardiovascular:  ?   Rate and Rhythm: Normal rate.  ?Pulmonary:  ?   Effort: Pulmonary effort is normal. No respiratory distress.  ?Musculoskeletal:     ?   General: Normal range of motion.  ?Skin: ?   General: Skin is warm and dry.  ?Neurological:  ?   Mental Status: He is alert and oriented to person, place, and time.  ?Psychiatric:     ?   Mood and Affect: Mood is anxious.     ?   Speech: Speech normal.     ?   Behavior: Behavior is cooperative.     ?   Thought Content: Thought content does not include homicidal or suicidal ideation. Thought content does not include suicidal plan.  ? ?Review of Systems  ?Constitutional:  Negative for chills, diaphoresis, fever, malaise/fatigue and weight loss.  ?HENT:  Negative for congestion.   ?Respiratory:  Negative for cough and shortness of breath.   ?Cardiovascular:  Negative for chest pain and palpitations.  ?Gastrointestinal:  Negative for diarrhea, nausea and vomiting.   ?Neurological:  Negative for dizziness and seizures.  ?Psychiatric/Behavioral:  Negative for depression, hallucinations, memory loss, substance abuse and suicidal ideas. The patient is nervous/anxious and has insomnia.   ?All other systems reviewed and are negative. ? ?Blood pressure 133/88, pulse 77, temperature 98.4 ?F (36.9 ?C), temperature source Oral, resp. rate 18, SpO2 100 %. There is no height or weight on file to calculate BMI. ? ?Musculoskeletal: ?Strength & Muscle Tone: within normal limits ?Gait & Station: normal ?Patient leans: N/A ? ? ?[facility] MSE Discharge Disposition for Follow up and Recommendations: ?Based on my evaluation the patient does not appear to have an emergency medical condition and can be discharged with resources and follow up care in outpatient services for Medication Management and Individual Therapy ? ?Disposition: No evidence of imminent risk to self or others at present.   ?Patient does not meet criteria for psychiatric inpatient admission. ?Supportive therapy provided about ongoing stressors. ?Discussed crisis plan, support from social network, calling 911, coming to the Emergency Department, and calling Suicide Hotline. ? ? ?Follow up with [facility]  ? ? ? ?Jackelyn Poling, NP ?01/16/2022, 5:25 AM ? ?

## 2022-01-17 ENCOUNTER — Emergency Department (HOSPITAL_COMMUNITY)
Admission: EM | Admit: 2022-01-17 | Discharge: 2022-01-17 | Disposition: A | Discharge status: Home / Self Care | Payer: Medicaid Other | Attending: Emergency Medicine | Admitting: Emergency Medicine

## 2022-01-17 ENCOUNTER — Ambulatory Visit (HOSPITAL_COMMUNITY)
Admission: EM | Admit: 2022-01-17 | Discharge: 2022-01-17 | Disposition: A | Discharge status: Home / Self Care | Payer: Medicaid Other

## 2022-01-17 ENCOUNTER — Other Ambulatory Visit: Payer: Self-pay

## 2022-01-17 ENCOUNTER — Encounter (HOSPITAL_COMMUNITY): Payer: Self-pay

## 2022-01-17 DIAGNOSIS — Z711 Person with feared health complaint in whom no diagnosis is made: Secondary | ICD-10-CM

## 2022-01-17 DIAGNOSIS — F17210 Nicotine dependence, cigarettes, uncomplicated: Secondary | ICD-10-CM | POA: Diagnosis not present

## 2022-01-17 MED ORDER — ALPRAZOLAM 0.5 MG PO TABS
0.5000 mg | ORAL_TABLET | Freq: Once | ORAL | Status: AC
  Administered 2022-01-17: 0.5 mg via ORAL
  Filled 2022-01-17: qty 1

## 2022-01-17 NOTE — Progress Notes (Signed)
?   01/17/22 0420  ?[facility] Triage Screening (Walk-ins at [facility] only)  ?What Is the Reason for Your Visit/Call Today? Pt has a diagnosis of schizophrenia and frequent presentations to emergency services. He was at [facility] 24 hours ago. This morning he says he needs help but does not know what kind of help he needs. He says people were chasing him. When asked if he has used drugs today, he responds "I don't know." While in assessment room, Pt says he was starting to feel agitated and insisted he wanted to leave. He denies current suicidal ideation. He denies homicidal ideation. He denies experiencing auditory or visual hallucinations. He denies physical illness. Medical record indicates he receives ACTT services through [place].  ?How Long Has This Been Causing You Problems? > than 6 months  ?Have You Recently Had Any Thoughts About Hurting Yourself? No  ?Are You Planning to Commit Suicide/Harm Yourself At This time? No  ?Have you Recently Had Thoughts About Hurting Someone [name]? No  ?Are You Planning To Harm Someone At This Time? No  ?Are you currently experiencing any auditory, visual or other hallucinations? No  ?Have You Used Any Alcohol or Drugs in the Past 24 Hours? No  ?How long ago did you use Drugs or Alcohol? Pt denies  ?What Did You Use and How Much? Pt denies  ?Do you have any current medical co-morbidities that require immediate attention? No  ?Clinician description of patient physical appearance/behavior: Pt is disheveled, alert and oriented to person, place, and situation. He appears tired and anxious. Pt speaks in a mumbled tone, at low volume and normal pace. Motor behavior appears restless. Eye contact is minimal. Pt's mood is anxious and affect is blunted.  ?What Do You Feel Would Help You the Most Today? Medication(s)  ?If access to [facility] Urgent Care was not available, would you have sought care in the Emergency Department? Yes  ?Determination of Need Routine (7 days)  ?Options For Referral Medication  Management;Outpatient Therapy  ? ? ?

## 2022-01-17 NOTE — ED Provider Notes (Signed)
? ?[facility]-EMERGENCY DEPT ?Provider Note: Lowella Dell, MD, FACEP ? ?CSN: [id number] ?MRN: [id number] ?ARRIVAL: 01/17/22 at 0520 ?ROOM: WA13/WA13 ? ? ?CHIEF COMPLAINT  ?Eye Problem ? ? ?HISTORY OF PRESENT ILLNESS  ?01/17/22 5:50 AM ?[name] is a 31 y.o. male with an extensive psychiatric history and multiple visits to [facility] health ED's.  He has had 30 visits in the last 6 months and 7 visits so far this month.  He was last seen yesterday at [facility].  He states he is having trouble seeing things and would like a Xanax to fix this problem.  He cannot tell me when his vision problems started.  He is otherwise uncooperative and does not want me to examine him. ? ? ?Past Medical History:  ?Diagnosis Date  ? Depression   ? Schizophrenia (HCC)   ? ? ?Past Surgical History:  ?Procedure Laterality Date  ? BACK SURGERY    ? ? ?History reviewed. No pertinent family history. ? ?Social History  ? ?Tobacco Use  ? Smoking status: Some Days  ?  Types: Cigarettes  ? Smokeless tobacco: Never  ?Vaping Use  ? Vaping Use: Never used  ?Substance Use Topics  ? Alcohol use: No  ? Drug use: Yes  ?  Types: Marijuana  ?  Comment: rare  ? ? ?Prior to Admission medications   ?Medication Sig Start Date End Date Taking? Authorizing Provider  ?paliperidone (INVEGA) 6 MG 24 hr tablet Take 1 tablet (6 mg total) by mouth daily for 14 days. ?Patient not taking: No sig reported 01/02/21 02/22/21  Mare Ferrari, PA-C  ? ? ?Allergies ?Shellfish allergy and Bee pollen ? ? ?REVIEW OF SYSTEMS  ?Negative except as noted here or in the History of Present Illness. ? ? ?PHYSICAL EXAMINATION  ?Initial Vital Signs ?Blood pressure 117/89, pulse 70, temperature 98.3 ?F (36.8 ?C), temperature source Oral, resp. rate 16, height 5\' 5"  (1.651 m), weight 72.6 kg, SpO2 100 %. ? ?Examination ?General: Well-developed, well-nourished male in no acute distress; appearance consistent with age of record ?HENT: normocephalic; atraumatic ?Eyes: pupils equal, round and reactive to  light; extraocular muscles grossly intact; patient would not permit ophthalmoscopic examination; patient able to ambulate, move about the room without running into objects and put on his shoes and tie the shoelaces without any apparent difficulty ?Neck: supple ?Heart: regular rate and rhythm ?Lungs: Normal respiratory effort and excursion ?Abdomen: soft; nondistended ?Extremities: No deformity; full range of motion; pulses normal ?Neurologic: Awake, alert; motor function intact in all extremities and symmetric; no facial droop ?Skin: Warm and dry ?Psychiatric: No SI; no HI ? ? ?RESULTS  ?Summary of this visit's results, reviewed and interpreted by myself: ? ? EKG Interpretation ? ?Date/Time:    ?Ventricular Rate:    ?PR Interval:    ?QRS Duration:   ?QT Interval:    ?QTC Calculation:   ?R Axis:     ?Text Interpretation:   ?  ? ?  ? ?Laboratory Studies: ?No results found for this or any previous visit (from the past 24 hour(s)). ?Imaging Studies: ?No results found. ? ?ED COURSE and MDM  ?Nursing notes, initial and subsequent vitals signs, including pulse oximetry, reviewed and interpreted by myself. ? ?Vitals:  ? 01/17/22 0530  ?BP: 117/89  ?Pulse: 70  ?Resp: 16  ?Temp: 98.3 ?F (36.8 ?C)  ?TempSrc: Oral  ?SpO2: 100%  ?Weight: 72.6 kg  ?Height: 5\' 5"  (1.651 m)  ? ?Medications  ?ALPRAZolam 03/19/22) tablet 0.5 mg (has no administration in  time range)  ? ? ?Patient appears to be exhibiting delusional behavior.  There is no evidence on my examination that he is having any difficulty with his vision.  He is evasive in terms of answering questions.  He will not allow me to do a proper eye examination.  I believe this is a manifestation of his chronic psychiatric problems. ? ?PROCEDURES  ?Procedures ? ? ?ED DIAGNOSES  ? ?  ICD-10-CM   ?1. Concern about eye disease without diagnosis  Z71.1   ?  ? ? ? ?  ?Paula Libra, MD ?01/17/22 3016 ? ?

## 2022-01-17 NOTE — ED Triage Notes (Signed)
Pt states that he is having a hard time seeing things.  ?

## 2022-01-18 ENCOUNTER — Ambulatory Visit (HOSPITAL_COMMUNITY)
Admission: EM | Admit: 2022-01-18 | Discharge: 2022-01-18 | Disposition: A | Discharge status: Home / Self Care | Payer: Medicaid Other | Attending: Psychiatry | Admitting: Psychiatry

## 2022-01-18 DIAGNOSIS — Z91148 Patient's other noncompliance with medication regimen for other reason: Secondary | ICD-10-CM | POA: Insufficient documentation

## 2022-01-18 DIAGNOSIS — F200 Paranoid schizophrenia: Secondary | ICD-10-CM | POA: Insufficient documentation

## 2022-01-18 DIAGNOSIS — Z20822 Contact with and (suspected) exposure to covid-19: Secondary | ICD-10-CM | POA: Insufficient documentation

## 2022-01-18 LAB — CBC WITH DIFFERENTIAL/PLATELET
Abs Immature Granulocytes: 0.02 10*3/uL (ref 0.00–0.07)
Basophils Absolute: 0 10*3/uL (ref 0.0–0.1)
Basophils Relative: 1 %
Eosinophils Absolute: 0.4 10*3/uL (ref 0.0–0.5)
Eosinophils Relative: 6 %
HCT: 42.6 % (ref 39.0–52.0)
Hemoglobin: 14.6 g/dL (ref 13.0–17.0)
Immature Granulocytes: 0 %
Lymphocytes Relative: 52 %
Lymphs Abs: 3.2 10*3/uL (ref 0.7–4.0)
MCH: 31.2 pg (ref 26.0–34.0)
MCHC: 34.3 g/dL (ref 30.0–36.0)
MCV: 91 fL (ref 80.0–100.0)
Monocytes Absolute: 0.5 10*3/uL (ref 0.1–1.0)
Monocytes Relative: 8 %
Neutro Abs: 2 10*3/uL (ref 1.7–7.7)
Neutrophils Relative %: 33 %
Platelets: 257 10*3/uL (ref 150–400)
RBC: 4.68 MIL/uL (ref 4.22–5.81)
RDW: 12.8 % (ref 11.5–15.5)
WBC: 6.2 10*3/uL (ref 4.0–10.5)
nRBC: 0 % (ref 0.0–0.2)

## 2022-01-18 LAB — COMPREHENSIVE METABOLIC PANEL
ALT: 17 U/L (ref 0–44)
AST: 24 U/L (ref 15–41)
Albumin: 3.9 g/dL (ref 3.5–5.0)
Alkaline Phosphatase: 43 U/L (ref 38–126)
Anion gap: 7 (ref 5–15)
BUN: 10 mg/dL (ref 6–20)
CO2: 28 mmol/L (ref 22–32)
Calcium: 9.3 mg/dL (ref 8.9–10.3)
Chloride: 103 mmol/L (ref 98–111)
Creatinine, Ser: 1.15 mg/dL (ref 0.61–1.24)
GFR, Estimated: >60 mL/min (ref >60)
Glucose, Bld: 99 mg/dL (ref 70–99)
Potassium: 3.7 mmol/L (ref 3.5–5.1)
Sodium: 138 mmol/L (ref 135–145)
Total Bilirubin: 0.6 mg/dL (ref 0.3–1.2)
Total Protein: 6.3 g/dL — ABNORMAL LOW (ref 6.5–8.1)

## 2022-01-18 LAB — POC SARS CORONAVIRUS 2 AG: SARSCOV2ONAVIRUS 2 AG: NEGATIVE

## 2022-01-18 LAB — HEMOGLOBIN A1C
Hgb A1c MFr Bld: 5.2 % (ref 4.8–5.6)
Mean Plasma Glucose: 102.54 mg/dL

## 2022-01-18 LAB — RESP PANEL BY RT-PCR (FLU A&B, COVID) ARPGX2
Influenza A by PCR: NEGATIVE
Influenza B by PCR: NEGATIVE
SARS Coronavirus 2 by RT PCR: NEGATIVE

## 2022-01-18 LAB — TSH: TSH: 2.436 u[IU]/mL (ref 0.350–4.500)

## 2022-01-18 MED ORDER — ALUM & MAG HYDROXIDE-SIMETH 200-200-20 MG/5ML PO SUSP: 30.0000 mL | ORAL | Status: DC | PRN

## 2022-01-18 MED ORDER — MAGNESIUM HYDROXIDE 400 MG/5ML PO SUSP: 30.0000 mL | Freq: Every day | ORAL | Status: DC | PRN

## 2022-01-18 MED ORDER — ACETAMINOPHEN 325 MG PO TABS: 650.0000 mg | ORAL_TABLET | Freq: Four times a day (QID) | ORAL | Status: DC | PRN

## 2022-01-18 NOTE — Progress Notes (Signed)
?   01/18/22 0301  ?[facility] Triage Screening (Walk-ins at [facility] only)  ?How Did You Hear About [place]? Self  ?What Is the Reason for Your Visit/Call Today? [name] is a 31 year old male presenting voluntary to [facility] due paranoia and wanting a place to stay. Patient has a diagnosis of schizophrenia and frequent presenting to emergency services. Patient denied SI, HI and alcohol/drug usage. Patient appears to be restless at times during assessment and looking at windows and doors as if someone is following him. When asked if he is experiencing hallucinations or has used substances, he responds "I don't know." When asked, why are you here, patient states "somewhere to stay". Patient responds to most questions with "I don't know". Patient medical record indicates he receives ACTT services through [place], however patient denied receiving medication management or therapy.  ?How Long Has This Been Causing You Problems? 1 wk - 1 month  ?Have You Recently Had Any Thoughts About Hurting Yourself? No  ?Are You Planning to Commit Suicide/Harm Yourself At This time? No  ?Have you Recently Had Thoughts About Hurting Someone [name]? No  ?Are You Planning To Harm Someone At This Time? No  ?Are you currently experiencing any auditory, visual or other hallucinations? Yes  ?Please explain the hallucinations you are currently experiencing: paranoia  ?Have You Used Any Alcohol or Drugs in the Past 24 Hours? No  ?How long ago did you use Drugs or Alcohol? denied  ?What Did You Use and How Much? denied  ?Do you have any current medical co-morbidities that require immediate attention? No  ?Clinician description of patient physical appearance/behavior: disheveled / cooperative  ?What Do You Feel Would Help You the Most Today? Treatment for Depression or other mood problem;Medication(s)  ?If access to [facility] Urgent Care was not available, would you have sought care in the Emergency Department? Yes  ?Determination of Need Routine (7 days)  ?Options For  Referral Medication Management  ? ? ?

## 2022-01-18 NOTE — ED Notes (Signed)
Patient to unit - no sxs of distress.  Some anxiety noted. Denies SI, HI, AVH. Only answers questions with a yes or no. - will continue to monitor for safety ?

## 2022-01-18 NOTE — Progress Notes (Signed)
Patient discharge with ACT team nurse  [name]. AVS given with community resources. ?

## 2022-01-18 NOTE — ED Provider Notes (Signed)
FBC/OBS ASAP Discharge Summary ? ?Date and Time: 01/18/2022 10:36 AM  ?Name: [name]  ?MRN:  [id number]  ? ?Discharge Diagnoses:  ?Final diagnoses:  ?Noncompliance with medication regimen  ?Paranoid schizophrenia, chronic condition ([place])  ? ? ?Subjective: Patient states "I would like a shower then I am ready to go, could you call my brother?" ? ?Patient is reassessed, face-to-face, by nurse practitioner.  He is reclined in observation flex area upon my approach, appears asleep.  He is easily awakened. [name] is alert and oriented, he is pleasant and cooperative during assessment. ? ?He denies suicidal and homicidal ideations at this time.  He easily contracts verbally for safety with this Probation officer. ? ?Patient has been diagnosed with paranoid schizophrenia, substance-induced mood disorder, cannabis use disorder, alcohol abuse.  He reports he is followed by [facility] ACT team, gives verbal consent for this writer to contact ACT team staff.  He does not discuss specifics of medications.  He is unable to recall medications and does not disclose when he last had  access to medication.  No family mental health history reported.  Patient endorses history of multiple previous inpatient psychiatric hospitalizations. ? ?[name] resides in [place] with his aunt, denies access to weapons.  He is currently not employed.  He denies alcohol and substance use.  He endorses average sleep and appetite. ? ?Patient offered support and encouragement. ?Spoke with [facility] act team staff member, Lucinda Dell phone number [phone].  A staff member will pick up [facility] [place] and transport to his aunt's home later today. ?Attempted to reach patient's aunt, Barbee Cough phone number [phone], HIPAA compliant voicemail left ? ?Stay Summary: HPI from today at 0429am:  [name] 31 y.o male present to [facility], with paranoia behavior,  pt states there are people chasing me and I'm afraid they going catch me.  Pt is a frequent to [facility] however most  time he would only come to the lobby and stay for a few minute,  tonight he wanted to be admitted.   ?  ?Observation of patient, he is alert to place, time,  self.  Speech clear but disorganized,  appearance and odor unkept.  Report there are people chasing after me.  Pt denies SI, or HI. Seen to be influence ab internal stimuli.  Pt could not recall when the last time he took his medication.  Pt is followed bt ACT but could not recall when he was last see by them.   ?  ?Recommend inpatient admission  ? ? ?Total Time spent with patient: 30 minutes ? ?Past Psychiatric History: Schizophrenia, cannabis use disorder, alcohol use disorder, substance-induced mood disorder ?Past Medical History:  ?Past Medical History:  ?Diagnosis Date  ? Depression   ? Schizophrenia ([place])   ?  ?Past Surgical History:  ?Procedure Laterality Date  ? BACK SURGERY    ? ?Family History: No family history on file. ?Family Psychiatric History: None reported ?Social History:  ?Social History  ? ?Substance and Sexual Activity  ?Alcohol Use No  ?   ?Social History  ? ?Substance and Sexual Activity  ?Drug Use Yes  ? Types: Marijuana  ? Comment: rare  ?  ?Social History  ? ?Socioeconomic History  ? Marital status: Single  ?  Spouse name: Not on file  ? Number of children: Not on file  ? Years of education: Not on file  ? Highest education level: Not on file  ?Occupational History  ? Occupation: unemployed  ?Tobacco Use  ? Smoking status:  Some Days  ?  Types: Cigarettes  ? Smokeless tobacco: Never  ?Vaping Use  ? Vaping Use: Never used  ?Substance and Sexual Activity  ? Alcohol use: No  ? Drug use: Yes  ?  Types: Marijuana  ?  Comment: rare  ? Sexual activity: Yes  ?  Birth control/protection: Condom  ?Other Topics Concern  ? Not on file  ?Social History Narrative  ? ** Merged History Encounter **  ?    ? ** Merged History Encounter **  ?    ? UTA certain topics - tangential thought process, cannot answer clearly. Pt not sure about school; reportedly  is homeless.   ? ?Social Determinants of Health  ? ?Financial Resource Strain: Not on file  ?Food Insecurity: Not on file  ?Transportation Needs: Not on file  ?Physical Activity: Not on file  ?Stress: Not on file  ?Social Connections: Not on file  ? ?SDOH:  ?SDOH Screenings  ? ?Alcohol Screen: Not on file  ?Depression (PHQ2-9): Low Risk   ? PHQ-2 Score: 4  ?Financial Resource Strain: Not on file  ?Food Insecurity: Not on file  ?Housing: Not on file  ?Physical Activity: Not on file  ?Social Connections: Not on file  ?Stress: Not on file  ?Tobacco Use: High Risk  ? Smoking Tobacco Use: Some Days  ? Smokeless Tobacco Use: Never  ? Passive Exposure: Not on file  ?Transportation Needs: Not on file  ? ? ?Tobacco Cessation:  A prescription for an FDA-approved tobacco cessation medication was offered at discharge and the patient refused ? ?Current Medications:  ?Current Facility-Administered Medications  ?Medication Dose Route Frequency Provider Last Rate Last Admin  ? acetaminophen (TYLENOL) tablet 650 mg  650 mg Oral Q6H PRN Evette Georges, NP      ? alum & mag hydroxide-simeth (MAALOX/MYLANTA) 200-200-20 MG/5ML suspension 30 mL  30 mL Oral Q4H PRN Evette Georges, NP      ? magnesium hydroxide (MILK OF MAGNESIA) suspension 30 mL  30 mL Oral Daily PRN Evette Georges, NP      ? ?No current outpatient medications on file.  ? ? ?PTA Medications: (Not in a hospital admission) ? ? ?Musculoskeletal  ?Strength & Muscle Tone: within normal limits ?Gait & Station: normal ?Patient leans: N/A ? ?Psychiatric Specialty Exam  ?Presentation  ?General Appearance: Appropriate for Environment; Casual ? ?Eye Contact:Good ? ?Speech:Clear and Coherent; Normal Rate ? ?Speech Volume:Normal ? ?Handedness:Right ? ? ?Mood and Affect  ?Mood:Euthymic ? ?Affect:Congruent; Appropriate ? ? ?Thought Process  ?Thought Processes:Coherent; Goal Directed; Linear ? ?Descriptions of Associations:Intact ? ?Orientation:Full (Time, Place and Person) ? ?Thought  Content:Logical ? Diagnosis of Schizophrenia or Schizoaffective disorder in past: Yes ?  ? Hallucinations:Hallucinations: None ?Description of Auditory Hallucinations: talk to himself ? ?Ideas of Reference:None ? ?Suicidal Thoughts:Suicidal Thoughts: No ? ?Homicidal Thoughts:Homicidal Thoughts: No ? ? ?Sensorium  ?Memory:Immediate Fair ? ?Judgment:Fair ? ?Insight:Fair ? ? ?Executive Functions  ?Concentration:Good ? ?Attention Span:Good ? ?Recall:Good ? ?Fund of [place] ? ?Language:Good ? ? ?Psychomotor Activity  ?Psychomotor Activity:Psychomotor Activity: Normal ? ? ?Assets  ?Assets:Communication Skills; Housing; Intimacy; Leisure Time; Physical Health; Resilience; Social Support; Talents/Skills ? ? ?Sleep  ?Sleep:Sleep: Fair ? ? ?Nutritional Assessment (For OBS and FBC admissions only) ?Has the patient had a weight loss or gain of 10 pounds or more in the last 3 months?: No ?Has the patient had a decrease in food intake/or appetite?: No ?Does the patient have dental problems?: No ?Does the patient have eating habits or  behaviors that may be indicators of an eating disorder including binging or inducing vomiting?: No ?Has the patient recently lost weight without trying?: 0 ?Has the patient been eating poorly because of a decreased appetite?: 0 ?Malnutrition Screening Tool Score: 0 ? ? ? ?Physical Exam  ?Physical Exam ?Vitals and nursing note reviewed.  ?Constitutional:   ?   Appearance: Normal appearance. He is well-developed.  ?HENT:  ?   Head: Normocephalic and atraumatic.  ?   Nose: Nose normal.  ?Cardiovascular:  ?   Rate and Rhythm: Normal rate.  ?Pulmonary:  ?   Effort: Pulmonary effort is normal.  ?Musculoskeletal:     ?   General: Normal range of motion.  ?   Cervical back: Normal range of motion.  ?Skin: ?   General: Skin is warm and dry.  ?Neurological:  ?   Mental Status: He is alert and oriented to person, place, and time.  ?Psychiatric:     ?   Attention and Perception: Attention and perception  normal.     ?   Mood and Affect: Mood and affect normal.     ?   Speech: Speech normal.     ?   Behavior: Behavior normal. Behavior is cooperative.     ?   Thought Content: Thought content normal.     ?

## 2022-01-18 NOTE — ED Notes (Signed)
Patient refusing EKG. Says he does not have to void at this time but cup given to patient and instructed to use cup when needs to go ?

## 2022-01-18 NOTE — BH Assessment (Signed)
Comprehensive Clinical Assessment (CCA) Note ? ?01/18/2022 ?Adrian GivensMarlo Warner ?[id number] ? ?Disposition: ?Sindy Guadeloupeoy Williams, NP, patient meets inpatient criteria. Disposition SW to secure placement in the AM.  ? ?The patient demonstrates the following risk factors for suicide: Chronic risk factors for suicide include: psychiatric disorder of schizophrenia . Acute risk factors for suicide include: social withdrawal/isolation. Protective factors for this patient include: positive social support, coping skills, and hope for the future. Considering these factors, the overall suicide risk at this point appears to be moderate. Patient is not appropriate for outpatient follow up. ? ?Flowsheet Row ED [facility]17[facility] Pre-admit (Canceled) from [facility]  ?C-SSRS RISK CATEGORY No Risk No Risk No Risk  ? ?  ? ?Chief Complaint:  ?Chief Complaint  ?Patient presents with  ? Psychiatric Evaluation  ? ?Visit Diagnosis:  ?Schizophrenia ? ? ?CCA Screening, Triage and Referral (STR) ? ?Patient Reported Information ?How did you hear about us? Self ? ?What Is the Reason for Your Visit/Call Today? Adrian GivensMarlo Warner is a 31 year old male [facility] due paranoia and wanting a place to stay. Patient has a diagnosis of schizophrenia and frequent presenting to emergency services. Patient denied SI, HI and alcohol/drug usage. Patient appears to be restless at times during assessment and looking at windows and doors as if someone is following him. When asked if he is experiencing hallucinations or has used substances, he responds "I don't know." When asked, why are you here, patient states "somewhere to stay". Patient responds to most questions with "I don't know". Patient medical record indicates he receives ACTT services through [facility], however patient denied receiving  medication management or therapy. ? ?How Long Has This Been Causing You Problems? 1 wk - 1 month ? ?What Do You Feel Would Help You the Most Today? Treatment for Depression or other mood problem; Medication(s) ? ? ?Have You Recently Had Any Thoughts About Hurting Yourself? No ? ?Are You Planning to Commit Suicide/Harm Yourself At This time? No ? ? ?Have you Recently Had Thoughts About Hurting Someone Karolee Ohslse? No ? ?Are You Planning to Harm Someone at This Time? No ? ?Explanation: No data recorded ? ?Have You Used Any Alcohol or Drugs in the Past 24 Hours? No ? ?How Long Ago Did You Use Drugs or Alcohol? No data recorded ?What Did You Use and How Much? denied ? ? ?Do You Currently Have a Therapist/Psychiatrist? Yes ? ?Name of Therapist/Psychiatrist: [facility] ? ? ?Have You Been Recently Discharged From Any Office Practice or Programs? No ? ?Explanation of Discharge From Practice/Program: [facility] Multiple visits to emergency services. ? ? ?  ?CCA Screening Triage Referral Assessment ?Type of Contact: Face-to-Face ? ?Telemedicine Service Delivery:   ?Is this Initial or Reassessment? Initial Assessment ? ?Date Telepsych consult ordered in [facility]:  10/31/21 ? ?Time Telepsych consult ordered in [facility]:  0848 ? ?[facility] ? ?[facility] ? ? ?Collateral Involvement: [name] team - Sheran Lawlessanisha, [name] RN ? ? ?Does Patient Have a Automotive engineerCourt Appointed Legal Guardian? No data recorded ?Name and Contact of Legal Guardian: No data recorded ?If Minor and Not Living with Parent(s), Who has Custody? NA ? ?Is CPS involved or ever been involved? Never ? ?Is APS involved or ever been involved? Never ? ? ?Patient Determined To Be At Risk for Harm To  Self or Others Based on Review of Patient Reported Information or Presenting Complaint? No ? ?Method: No data recorded ?Availability of Means: No data recorded ?Intent: No data recorded ?Notification  Required: No data recorded ?Additional Information for Danger to Others Potential: No data recorded ?Additional Comments for Danger to Others Potential: No data recorded ?Are There Guns or Other Weapons in Your Home? No data recorded ?Types of Guns/Weapons: No data recorded ?Are These Weapons Safely Secured?                            No data recorded ?Who Could Verify You Are Able To Have These Secured: No data recorded ?Do You Have any Outstanding Charges, Pending Court Dates, Parole/Probation? No data recorded ?Contacted To Inform of Risk of Harm To Self or Others: Unable to Contact: ? ? ? ?Does Patient Present under Involuntary Commitment? No ? ?IVC Papers Initial File Date: No data recorded ? ?[place] of Residence: [place] ? ? ?Patient Currently Receiving the Following Services: ACTT ([facility] Community Treatment) ? ? ?Determination of Need: Routine (7 days) ? ? ?Options For Referral: Medication Management ? ? ? ? ?CCA Biopsychosocial ?Patient Reported Schizophrenia/Schizoaffective Diagnosis in Past: Yes ? ? ?Strengths: Pt is linked to [facility] for ACTT services. ? ? ?Mental Health Symptoms ?Depression:   ?Difficulty Concentrating; Irritability; Sleep (too much or little); Fatigue; Change in energy/activity ?  ?Duration of Depressive symptoms:    ?Mania:   ?Change in energy/activity; Irritability ?  ?Anxiety:    ?Worrying; Tension; Sleep; Restlessness; Difficulty concentrating ?  ?Psychosis:   ?Other negative symptoms ?  ?Duration of Psychotic symptoms:  ?Duration of Psychotic Symptoms: Greater than six months ?  ?Trauma:   ?None ?  ?Obsessions:   ?None (Unable to assess due to Pt's mental status) ?  ?Compulsions:   ?None (Unable to assess due to Pt's mental status) ?  ?Inattention:   ?None ?  ?Hyperactivity/Impulsivity:   ?None ?  ?Oppositional/Defiant Behaviors:   ?None ?  ?Emotional Irregularity:   ?None ?  ?Other Mood/Personality Symptoms:   ?None noted ?  ? ?Mental Status Exam ?Appearance and self-care   ?Stature:   ?Average ?  ?Weight:   ?Average weight ?  ?Clothing:   ?Disheveled; Casual ?  ?Grooming:   ?Normal ?  ?Cosmetic use:   ?None ?  ?Posture/gait:   ?Tense ?  ?Motor activity:   ?Restless ?  ?Sensorium  ?Attention:   ?Distractible ?  ?Concentration:   ?Scattered ?  ?Orientation:   ?X5 ?  ?Recall/memory:   ?-- (Unable to assess due to Pt's mental status) ?  ?Affect and Mood  ?Affect:   ?Anxious ?  ?Mood:   ?Anxious ?  ?Relating  ?Eye contact:   ?Fleeting ?  ?Facial expression:   ?Constricted; Anxious; Tense ?  ?Attitude toward examiner:   ?Cooperative; Uninterested ?  ?Thought and Language  ?Speech flow:  ?Paucity ?  ?Thought content:   ?Suspicious ?  ?Preoccupation:   ?-- (Unable to assess due to Pt's mental status) ?  ?Hallucinations:   ?-- (Unable to assess due to Pt's mental status) ?  ?Organization:  No data recorded  ?Executive Functions  ?Fund of Knowledge:   ?Poor ?  ?Intelligence:   ?Needs investigation ?  ?Abstraction:   ?Concrete ?  ?Judgement:   ?Poor ?  ?Reality Testing:   ?Variable ?  ?Insight:   ?Poor ?  ?Decision Making:   ?Impulsive ?  ?Social Functioning  ?  Social Maturity:   ?Impulsive ?  ?Social Judgement:   ?Heedless ?  ?Stress  ?Stressors:   ?Housing; Financial ?  ?Coping Ability:   ?Deficient supports ?  ?Skill Deficits:   ?Communication; Decision making; Self-care; Self-control ?  ?Supports:   ?Support needed ?  ? ? ?Religion: ?Religion/Spirituality ?Are You A Religious Person?: No ?How Might This Affect Treatment?: Not assessed ? ?Leisure/Recreation: ?Leisure / Recreation ?Do You Have Hobbies?: Yes ?Leisure and Hobbies: Football, per chart ? ?Exercise/Diet: ?Exercise/Diet ?Do You Exercise?: No ?Have You Gained or Lost A Significant Amount of Weight in the Past Six Months?: No ?Do You Follow a Special Diet?: No ?Do You Have Any Trouble Sleeping?: Yes ?Explanation of Sleeping Difficulties: Pt acknowledges he has not been sleeping ? ? ?CCA Employment/Education ?Employment/Work  Situation: ?Employment / Work Situation ?Employment Situation: On disability ?Why is Patient on Disability: Mental health ?How Long has Patient Been on Disability: UTA ?Patient's Job has Been Impacted by Current Illness:

## 2022-01-18 NOTE — ED Notes (Signed)
Breakfast given.  

## 2022-01-18 NOTE — ED Notes (Signed)
[facility] called regarding lab pickup ?

## 2022-01-18 NOTE — Progress Notes (Signed)
Patient resting in reclined chair, respirations are unlabored, no acute distress observed. Staff will continue to monitor. ?

## 2022-01-18 NOTE — ED Provider Notes (Signed)
BH Urgent Care Continuous Assessment Admission H&P ? ?Date: 01/18/22 ?Patient Name: [name] ?MRN: [id number] ?Chief Complaint:  ?Chief Complaint  ?Patient presents with  ? Psychiatric Evaluation  ?   ? ?Diagnoses:  ?Final diagnoses:  ?Noncompliance with medication regimen  ?Paranoid schizophrenia, chronic condition (HCC)  ? ? ?HPI: [name] 31 y.o male present to [facility], with paranoia behavior,  pt states there are people chasing me and I'm afraid they going catch me.  Pt is a frequent to [facility] however most time he would only come to the lobby and stay for a few minute,  tonight he wanted to be admitted.   ? ?Observation of patient, he is alert to place, time,  self.  Speech clear but disorganized,  appearance and odor unkept.  Report there are people chasing after me.  Pt denies SI, or HI. Seen to be influence ab internal stimuli.  Pt could not recall when the last time he took his medication.  Pt is followed bt [facility] but could not recall when he was last see by them.   ? ?Recommend inpatient admission  ? ? ? ?PHQ 2-9:  ?Flowsheet Row ED from 10/31/2021 in [facility] EMERGENCY DEPARTMENT OP Visit from [facility]16/20[facility] from 05/08/2020 in [facility]  ?Thoughts that you would be better off dead, or of hurting yourself in some way Not at all Not at all Not at all  ?PHQ-9 Total Score 4 8 6   ? ?  ?  ?Flowsheet Row ED from 01/17/2022 in [facility] from 01/15/2022 in [facility] EMERGENCY DEPARTMENT Pre-admit (Canceled) from 02/22/2021 in [facility]  ?C-SSRS RISK CATEGORY No Risk No Risk No Risk  ? ?  ?  ? ?Total Time spent with patient: 20 minutes ? ?Musculoskeletal  ?Strength & Muscle Tone: within normal limits ?Gait & Station: normal ?Patient leans: N/A ? ?Psychiatric Specialty Exam  ?Presentation ?General Appearance: Disheveled ? ?Eye  Contact:Fair ? ?Speech:Clear and Coherent ? ?Speech Volume:Decreased ? ?Handedness:Ambidextrous ? ? ?Mood and Affect  ?Mood:Depressed; Hopeless ? ?Affect:Labile; Restricted ? ? ?Thought Process  ?Thought Processes:Disorganized ? ?Descriptions of Associations:Circumstantial ? ?Orientation:Partial ? ?Thought Content:Logical ? Diagnosis of Schizophrenia or Schizoaffective disorder in past: Yes ? Duration of Psychotic Symptoms: Greater than six months ? ?Hallucinations:Hallucinations: Auditory ?Description of Auditory Hallucinations: talk to himself ? ?Ideas of Reference:None ? ?Suicidal Thoughts:Suicidal Thoughts: No ? ?Homicidal Thoughts:Homicidal Thoughts: No ? ? ?Sensorium  ?Memory:Immediate Poor ? ?Judgment:Fair ? ?Insight:Fair ? ? ?Executive Functions  ?Concentration:Fair ? ?Attention Span:Fair ? ?Recall:Fair ? ?Fund of Knowledge:Fair ? ?Language:Fair ? ? ?Psychomotor Activity  ?Psychomotor Activity:No data recorded ? ?Assets  ?Assets:Financial Resources/Insurance; Leisure Time; Physical Health; Resilience; Social Support ? ? ?Sleep  ?Sleep:Sleep: Fair ? ? ?Nutritional Assessment (For OBS and FBC admissions only) ?Has the patient had a weight loss or gain of 10 pounds or more in the last 3 months?: No ?Has the patient had a decrease in food intake/or appetite?: No ?Does the patient have dental problems?: No ?Does the patient have eating habits or behaviors that may be indicators of an eating disorder including binging or inducing vomiting?: No ?Has the patient recently lost weight without trying?: 0 ?Has the patient been eating poorly because of a decreased appetite?: 0 ?Malnutrition Screening Tool Score: 0 ? ? ? ?Physical Exam ?HENT:  ?   Head: Normocephalic.  ?   Nose: Nose normal.  ?  Cardiovascular:  ?   Rate and Rhythm: Normal rate.  ?Pulmonary:  ?   Effort: Pulmonary effort is normal.  ?Musculoskeletal:     ?   General: Normal range of motion.  ?   Cervical back: Normal range of motion.  ?Skin: ?   General:  Skin is warm.  ?Neurological:  ?   General: No focal deficit present.  ?   Mental Status: He is alert.  ?Psychiatric:     ?   Mood and Affect: Mood normal.     ?   Thought Content: Thought content normal.     ?   Judgment: Judgment normal.  ? ?Review of Systems  ?Constitutional: Negative.   ?HENT: Negative.    ?Eyes: Negative.   ?Respiratory: Negative.    ?Cardiovascular: Negative.   ?Gastrointestinal: Negative.   ?Genitourinary: Negative.   ?Musculoskeletal: Negative.   ?Skin: Negative.   ?Neurological: Negative.   ?Endo/Heme/Allergies: Negative.   ?Psychiatric/Behavioral:  Positive for depression and hallucinations. The patient is nervous/anxious.   ? ?Blood pressure 131/76, pulse 79, temperature 98.2 ?F (36.8 ?C), temperature source Oral, resp. rate 18, SpO2 98 %. There is no height or weight on file to calculate BMI. ? ?Past Psychiatric History: schizophrenia,    ? ?Is the patient at risk to self? No  ?Has the patient been a risk to self in the past 6 months? No .    ?Has the patient been a risk to self within the distant past? No   ?Is the patient a risk to others? No   ?Has the patient been a risk to others in the past 6 months? No   ?Has the patient been a risk to others within the distant past? No  ? ?Past Medical History:  ?Past Medical History:  ?Diagnosis Date  ? Depression   ? Schizophrenia (HCC)   ?  ?Past Surgical History:  ?Procedure Laterality Date  ? BACK SURGERY    ? ? ?Family History: No family history on file. ? ?Social History:  ?Social History  ? ?Socioeconomic History  ? Marital status: Single  ?  Spouse name: Not on file  ? Number of children: Not on file  ? Years of education: Not on file  ? Highest education level: Not on file  ?Occupational History  ? Occupation: unemployed  ?Tobacco Use  ? Smoking status: Some Days  ?  Types: Cigarettes  ? Smokeless tobacco: Never  ?Vaping Use  ? Vaping Use: Never used  ?Substance and Sexual Activity  ? Alcohol use: No  ? Drug use: Yes  ?  Types:  Marijuana  ?  Comment: rare  ? Sexual activity: Yes  ?  Birth control/protection: Condom  ?Other Topics Concern  ? Not on file  ?Social History Narrative  ? ** Merged History Encounter **  ?    ? ** Merged History Encounter **  ?    ? UTA certain topics - tangential thought process, cannot answer clearly. Pt not sure about school; reportedly is homeless.   ? ?Social Determinants of Health  ? ?Financial Resource Strain: Not on file  ?Food Insecurity: Not on file  ?Transportation Needs: Not on file  ?Physical Activity: Not on file  ?Stress: Not on file  ?Social Connections: Not on file  ?Intimate Partner Violence: Not on file  ? ? ?SDOH:  ?SDOH Screenings  ? ?Alcohol Screen: Not on file  ?Depression (PHQ2-9): Low Risk   ? PHQ-2 Score: 4  ?Financial Resource Strain: Not on  file  ?Food Insecurity: Not on file  ?Housing: Not on file  ?Physical Activity: Not on file  ?Social Connections: Not on file  ?Stress: Not on file  ?Tobacco Use: High Risk  ? Smoking Tobacco Use: Some Days  ? Smokeless Tobacco Use: Never  ? Passive Exposure: Not on file  ?Transportation Needs: Not on file  ? ? ?Last Labs:  ?Admission on 01/15/2022, Discharged on 01/15/2022  ?Component Date Value Ref Range Status  ? Sodium 01/15/2022 139  135 - 145 mmol/L Final  ? Potassium 01/15/2022 4.2  3.5 - 5.1 mmol/L Final  ? Chloride 01/15/2022 106  98 - 111 mmol/L Final  ? CO2 01/15/2022 27  22 - 32 mmol/L Final  ? Glucose, Bld 01/15/2022 91  70 - 99 mg/dL Final  ? Glucose reference range applies only to samples taken after fasting for at least 8 hours.  ? BUN 01/15/2022 16  6 - 20 mg/dL Final  ? Creatinine, Ser 01/15/2022 1.02  0.61 - 1.24 mg/dL Final  ? Calcium 16/10/960404/06/2022 9.4  8.9 - 10.3 mg/dL Final  ? Total Protein 01/15/2022 6.7  6.5 - 8.1 g/dL Final  ? Albumin 54/09/811904/06/2022 4.3  3.5 - 5.0 g/dL Final  ? AST 14/78/295604/06/2022 25  15 - 41 U/L Final  ? ALT 01/15/2022 17  0 - 44 U/L Final  ? Alkaline Phosphatase 01/15/2022 44  38 - 126 U/L Final  ? Total Bilirubin  01/15/2022 0.7  0.3 - 1.2 mg/dL Final  ? GFR, Estimated 01/15/2022 >60  >60 mL/min Final  ? Comment: (NOTE) ?Calculated using the CKD-EPI Creatinine Equation (2021) ?  ? Anion gap 01/15/2022 6  5 - 15 Final  ? Performed at

## 2022-01-18 NOTE — Progress Notes (Signed)
Pt presents to this facility reporting that "there are people chasing me" and "I am pretty scared and afraid that they will catch me". ? ?Pt is disheveled, malodorous and his clothing is dirty which is not normally how he presents here. His eyes continually dart back and forth looking at the doors and windows. ? ?Pt reports that he is not wanting to hurt himself or others but "I want to get my mind right." ? ?He is polite and appears distracted when answering most questions.  He cannot remember the last time he took his medications that he is supposed to take.  He agreed that staff may contact his aunt in order to discuss his healthcare and visit. ? ?His aunt [name] has been documented as his payee as recently as October 2022 and reported to nursing staff that [place] lives by himself in an apartment in [place], [place].  The last known numbers for [name] were listed as: [phone] - home and [phone] -cell. ? ?This Probation officer has attempted to reach Ms. Midge Aver to confirm last documented information in reference to the patient's housing situation.  [phone] - this is no longer a valid number or the number has been disconnected. ? ?Attempts to contact her at [phone] (cell) went unanswered and a voicemail was left asking Ms. Midge Aver to call this facility and provide updated information for her nephew. ? ? ?

## 2022-01-18 NOTE — Discharge Instructions (Signed)

## 2022-01-19 ENCOUNTER — Ambulatory Visit (HOSPITAL_COMMUNITY)
Admission: EM | Admit: 2022-01-19 | Discharge: 2022-01-19 | Disposition: A | Discharge status: Home / Self Care | Payer: Medicaid Other

## 2022-01-19 NOTE — Progress Notes (Signed)
Second attempt made to convince patient to participate in the assessment process. ? ?Patient states that he does not want to talk to anybody, he wants food and to sleep in the lobby. ? ? ?

## 2022-01-19 NOTE — Progress Notes (Signed)
?   01/19/22 0122  ?Patient Reported Information  ?How Did You Hear About [place]? Self  ?What Is the Reason for Your Visit/Call Today? Clinician asked the pt, "what brought you to the hospital?" Pt was a poor historian during the assessment. Pt reports, "I'm tired as hell, sleepy." Pt reports, he doesn't want to stay with his aunt. Pt denies, using substance use. Pt then reports, "I'm just high and need somewhere to sleep." Pt denies, SI, HI, AVH, self-injurious behaviors and access to weapons.  ?How Long Has This Been Causing You Problems? > than 6 months  ?What Do You Feel Would Help You the Most Today? Alcohol or Drug Use Treatment;Housing Assistance  ?Have You Recently Had Any Thoughts About Hurting Yourself? No  ?Are You Planning to Commit Suicide/Harm Yourself At This time? No  ?Have you Recently Had Thoughts About Hurting Someone [name]? No  ?Are You Planning To Harm Someone At This Time? No  ?Have You Used Any Alcohol or Drugs in the Past 24 Hours? Yes  ?What Did You Use and How Much? Pt admitted he was high then denied it.  ?Do You Currently Have a Therapist/Psychiatrist? Yes  ?Name of Therapist/Psychiatrist Per chart, pt is linked to [facility] Team.  ?CCA Screening Triage Referral Assessment  ?Type of Contact Face-to-Face  ?Location of Assessment [facility] Assessment Services  ?Provider location [facility] Assessment Services  ?Collateral Involvement Clinician asked the pt if she could contact his aunt pt declined.  ?Patient Determined To Be At Risk for Harm To Self or Others Based on Review of Patient Reported Information or Presenting Complaint? No  ?Does Patient Present under Involuntary Commitment? No  ?[place] of Residence [place]  ?Patient Currently Receiving the Following Services: Not Receiving Services  ?Determination of Need Routine (7 days)  ?Options For Referral Medication Management;Outpatient Therapy  ? ? ?Determination of need: Routine.  ? ? ?Redmond Pulling, MS, [facility] ?Triage  Specialist ?[phone] ? ?

## 2022-01-19 NOTE — Progress Notes (Signed)
Pt refused to go to an assessment room to be assessed.  He said that he just wants to sleep in the lobby. ? ? ?

## 2022-01-20 ENCOUNTER — Ambulatory Visit (HOSPITAL_COMMUNITY)
Admission: EM | Admit: 2022-01-20 | Discharge: 2022-01-20 | Disposition: A | Discharge status: Home / Self Care | Payer: Medicaid Other | Attending: Behavioral Health | Admitting: Behavioral Health

## 2022-01-20 DIAGNOSIS — Z5900 Homelessness unspecified: Secondary | ICD-10-CM | POA: Insufficient documentation

## 2022-01-20 DIAGNOSIS — F200 Paranoid schizophrenia: Secondary | ICD-10-CM | POA: Insufficient documentation

## 2022-01-20 NOTE — ED Notes (Signed)
Pt discharged with  AVS.  AVS reviewed prior to discharge.  Pt alert, oriented, and ambulatory.  Safety maintained.  °

## 2022-01-20 NOTE — ED Provider Notes (Addendum)
Behavioral Health Urgent Care Medical Screening Exam ? ?Patient Name: [name] ?MRN: [id number] ?Date of Evaluation: 01/20/22 ? ?Diagnosis:  ?Final diagnoses:  ?Schizophrenia, paranoid type (HCC)  ? ? ?History of Present illness: [name] is a 31 y.o. male patient who presents to the [facility] behavioral health urgent care voluntarily as a walk-in unaccompanied with a chief complaint of "having trouble seeing things." Patient has a past psychiatric history significant for paranoid schizophrenia. Patient is well known to the behavioral health service line as he has been seen numerous times with similar presentations. ? ?Patient seen and evaluated face-to-face by this provider, and chart reviewed. Patient was last seen at the [facility] on 01/18/22 and admitted to continuous assessment and held overnight for observation and discharged the following day to [facility] act team. ? ?On evaluation, patient is alert and oriented to person and place. He thoughts are somewhat disorganized as he repeatedly answers questions by saying "I don't know." He appears to be at baseline. He speech is clear and coherent at a decreased tone. He has fair eye contact when conversing. He is fully dressed. ? ?[name] states, "I am trying to take a clean fresh shower. I ain't got nowhere to stay. I be everywhere." When asked about his apartment in [facility], he states, "I am homeless at the moment. I am tired and sleepy." He denies SI. He denies HI. He denies AVH and does not appear to be responding to internal stimuli. He denies using drugs or drinking alcohol. When asked if he is following up with [facility] ACT Team, he states, "I don't know." When asked if he has spoken to his aunt Clide Cliff, he states, "I don't know." He denies problems with his vision. He does not voice any medical concerns. When asked if he is currently taking medications, he states, "I don't know." When asked for permission to call his aunt Clide Cliff and the ACT Team  for transportation back home, he agrees to the plan. ? ?I contacted Laverne [phone], no answer. Hippa voicemail left.  ?I contacted Leisa Lenz [phone] at [facility] Team who states that she just picked [name] up two days ago from this facility and took him to his apartment in [facility]. She states that he does have a place to stay. She asked if we could allow [name] to bathe and provide him with food and she will staff his case this morning during treatment team and someone will pick him up from the facility around noon time.  ? ?[name] called this provider back to informed that the patient is no longer authorized for services after he refused services, an assessment and  was uncooperative on 01/18/22. She states that she is unable to pick patient up and he may be released. She states that when the patient is ready for services he can call [phone]. She states that the patient is not homeless and that he lives at [address] in [place], [place]. Went to inform patient of plan and he had already left the lobby.  ? ?Psychiatric Specialty Exam ? ?Presentation  ?General Appearance:Disheveled ? ?Eye Contact:Fair ? ?Speech:Clear and Coherent ? ?Speech Volume:Decreased ? ?Handedness:Right ? ? ?Mood and Affect  ?Mood:Euthymic ? ?Affect:Congruent ? ? ?Thought Process  ?Thought Processes:Coherent; Goal Directed ? ?Descriptions of Associations:Intact ? ?Orientation:Partial ? ?Thought Content:Logical ? Diagnosis of Schizophrenia or Schizoaffective disorder in past: Yes ? Duration of Psychotic Symptoms: Greater than six months ? Hallucinations:None ?talk to himself ? ?  Ideas of Reference:None ? ?Suicidal Thoughts:No ? ?Homicidal Thoughts:No ? ? ?Sensorium  ?Memory:Immediate Poor; Recent Fair; Remote Fair ? ?Judgment:Fair ? ?Insight:Fair ? ? ?Executive Functions  ?Concentration:Fair ? ?Attention Span:Fair ? ?Recall:Fair ? ?Fund of Knowledge:Fair ? ?Language:Fair ? ? ?Psychomotor Activity   ?Psychomotor Activity:Normal ? ? ?Assets  ?Assets:Communication Skills; Leisure Time; Housing; Physical Health ? ? ?Sleep  ?Sleep:Fair ? ?Number of hours: "I don't know." ? ? ?Physical Exam: ?Physical Exam ?HENT:  ?   Head: Normocephalic.  ?   Nose: Nose normal.  ?Eyes:  ?   Conjunctiva/sclera: Conjunctivae normal.  ?Cardiovascular:  ?   Rate and Rhythm: Normal rate.  ?Pulmonary:  ?   Effort: Pulmonary effort is normal.  ?Musculoskeletal:     ?   General: Normal range of motion.  ?   Cervical back: Normal range of motion.  ?Neurological:  ?   Mental Status: He is alert and oriented to person, place, and time.  ? ?Review of Systems  ?Constitutional: Negative.   ?HENT: Negative.    ?Eyes: Negative.   ?Respiratory: Negative.    ?Cardiovascular: Negative.   ?Gastrointestinal: Negative.   ?Genitourinary: Negative.   ?Musculoskeletal: Negative.   ?Skin: Negative.   ?Neurological: Negative.   ?Endo/Heme/Allergies: Negative.   ?Blood pressure 139/76, pulse 85, temperature 97.8 ?F (36.6 ?C), temperature source Oral, resp. rate 16, SpO2 97 %. There is no height or weight on file to calculate BMI. ? ?Musculoskeletal: ?Strength & Muscle Tone: within normal limits ?Gait & Station: normal ?Patient leans: N/A ? ? ?[facility] MSE Discharge Disposition for Follow up and Recommendations: ?Based on my evaluation the patient does not appear to have an emergency medical condition and can be discharged with resources and follow up care in outpatient services for Medication Management ? ? Follow-up Information   ? ? Call  [place].   ?Why: ACT Team at [phone] if you would like services. ?Contact information: ?[address]  ?[address] ?[place] [address] ?[phone] ? ? ?  ?  ? ?  ?  ? ?  ? ? ?Discharge recommendations:  ?Please see information for follow-up appointment with psychiatry and therapy. ?Please follow up with your primary care provider for all medical related needs.  ? ?Safety:  ?The patient should abstain from use of  illicit substances/drugs and abuse of any medications. ?If symptoms worsen or do not continue to improve or if the patient becomes actively suicidal or homicidal then it is recommended that the patient return to the closest hospital emergency department, the [facility], or call 911 for further evaluation and treatment. ?National Suicide Prevention Lifeline 1-800-SUICIDE or 308-493-1114. ? ?About 988 ?988 offers 24/7 access to trained crisis counselors who can help people experiencing mental health-related distress. People can call or text 988 or chat 988lifeline.org for themselves or if they are worried about a loved one who may need crisis support.  ? ? ?Layla Barter, NP ?01/20/2022, 7:30 AM ? ?

## 2022-01-20 NOTE — Discharge Instructions (Addendum)
Discharge recommendations:   Please see information for follow-up appointment with psychiatry and therapy.  Please follow up with your primary care provider for all medical related needs.   Safety:  The patient should abstain from use of illicit substances/drugs and abuse of any medications. If symptoms worsen or do not continue to improve or if the patient becomes actively suicidal or homicidal then it is recommended that the patient return to the closest hospital emergency department, the [facility], or call 911 for further evaluation and treatment. National Suicide Prevention Lifeline 1-800-SUICIDE or 1-800-273-8255.  About 988 988 offers 24/7 access to trained crisis counselors who can help people experiencing mental health-related distress. People can call or text 988 or chat 988lifeline.org for themselves or if they are worried about a loved one who may need crisis support.     

## 2022-01-20 NOTE — Progress Notes (Signed)
?   01/20/22 0442  ?Patient Reported Information  ?How Did You Hear About [place]? Self  ?What Is the Reason for Your Visit/Call Today? Clinician asked the pt, "what brought you to [facility]?" Pt was a poor historian during triage. Pt reports, "having trouble seeing things." Clinician asked the pt if he was having vision problems, what was the farthest he can see? Pt replied, "I don't know. Pt reports, he's tired and sleepy. Pt reports, he just wants to sleep. Pt reports, he's homeless, he has no where to go, he can't return to his aunts house. Pt denies, SI, HI, AVH, self-injurious behaviors and access to weapons. Pt reports, if discharged he can contract for safety.  ?How Long Has This Been Causing You Problems? > than 6 months  ?What Do You Feel Would Help You the Most Today? Housing Assistance;Medication(s)  ?Have You Recently Had Any Thoughts About Hurting Yourself? No  ?Are You Planning to Commit Suicide/Harm Yourself At This time? No  ?Have you Recently Had Thoughts About Hurting Someone [name]? No  ?Are You Planning To Harm Someone At This Time? No  ?Have You Used Any Alcohol or Drugs in the Past 24 Hours? No  ?What Did You Use and How Much? Pt denies.  ?Do You Currently Have a Therapist/Psychiatrist? Yes  ?Name of Therapist/Psychiatrist [facility] ACT Team, clinician suggested pt to call his ACT Team to assist with housing and other needs.  ?CCA Screening Triage Referral Assessment  ?Type of Contact Face-to-Face  ?Location of Assessment [facility] Assessment Services  ?Provider location [facility] Assessment Services  ?Collateral Involvement Pt declined for clinician to contact anyone to obtain collateral information.  ?Patient Determined To Be At Risk for Harm To Self or Others Based on Review of Patient Reported Information or Presenting Complaint? No  ?Does Patient Present under Involuntary Commitment? No  ?[place] of Residence [place]  ?Patient Currently Receiving the Following Services: ACTT (Assertive Community Treatment)   ?Determination of Need Routine (7 days)  ?Options For Referral Other: Comment;Medication Management;Outpatient Therapy ?(Pt to contact his ACT Team [facility]) to discuss current housing needs and to discuss OPT treatment options.)  ? ? ?Determination of need: Routine.  ? ? ?Redmond Pulling, MS, [facility], [facility] ?Triage Specialist ?[phone] ? ?

## 2022-01-23 ENCOUNTER — Ambulatory Visit (HOSPITAL_COMMUNITY)
Admission: EM | Admit: 2022-01-23 | Discharge: 2022-01-23 | Disposition: A | Discharge status: Home / Self Care | Payer: Medicaid Other

## 2022-01-23 ENCOUNTER — Ambulatory Visit (HOSPITAL_COMMUNITY)
Admission: EM | Admit: 2022-01-23 | Discharge: 2022-01-23 | Disposition: A | Discharge status: Home / Self Care | Payer: Medicaid Other | Attending: Psychiatry | Admitting: Psychiatry

## 2022-01-23 DIAGNOSIS — Z91148 Patient's other noncompliance with medication regimen for other reason: Secondary | ICD-10-CM

## 2022-01-23 DIAGNOSIS — F200 Paranoid schizophrenia: Secondary | ICD-10-CM

## 2022-01-23 NOTE — Progress Notes (Signed)
?   01/23/22 0603  ?Patient Reported Information  ?How Did You Hear About [place]? Self  ?What Is the Reason for Your Visit/Call Today? Clinician assessed the pt as a walk-in at 0332 at [facility] however the pt left before being assessed by the provider. Clinician asked the pt, "what brought back [facility]?" Pt replied, "nothing." Clinician asked the pt what type of services is he seeking? Pt replied, "nothing." Pt then expressed he wanted to know what his aunt said. Clinician expressed that she located two numbers for his aunt however she did not answer. Pt denies, SI, HI, AVH, self-injurious behaviors and access to weapons. Pt reports, if discharged he can contract for safety.  ?How Long Has This Been Causing You Problems? > than 6 months  ?What Do You Feel Would Help You the Most Today? Treatment for Depression or other mood problem;Medication(s)  ?Have You Recently Had Any Thoughts About Hurting Yourself? No  ?Are You Planning to Commit Suicide/Harm Yourself At This time? No  ?Have you Recently Had Thoughts About [place]? No  ?Are You Planning To Harm Someone At This Time? No  ?Have You Used Any Alcohol or Drugs in the Past 24 Hours? No  ?What Did You Use and How Much? NA  ?Do You Currently Have a Therapist/Psychiatrist? No  ?Name of Therapist/Psychiatrist Per Darrol Angel, NP note on 01/20/2022: "[name] called this provider back to informed that the patient is no longer authorized for services after he refused services, an assessment and was uncooperative on 01/18/22."  ?CCA Screening Triage Referral Assessment  ?Type of Contact Face-to-Face  ?Location of Assessment [facility] Assessment Services  ?Provider location [facility] Assessment Services  ?Collateral Involvement Please review collateral note from previous encounter a few hours ago (at [facility] on04/17/2023).  ?Patient Determined To Be At Risk for Harm To Self or Others Based on Review of Patient Reported Information or Presenting Complaint? No  ?Does  Patient Present under Involuntary Commitment? No  ?[place]  ?Patient Currently Receiving the Following Services: Not Receiving Services  ?Determination of Need Routine (7 days)  ?Options For Referral Medication Management;Outpatient Therapy  ? ? ?Determination of need: Routine.  ? ? ?Vertell Novak, MS, [facility], [facility] ?Triage Specialist ?[phone] ? ?

## 2022-01-23 NOTE — Discharge Instructions (Addendum)

## 2022-01-23 NOTE — ED Provider Notes (Signed)
Patient left prior to been assessed by this Probation officer. Patient came to [facility] voluntarily.  ? ?Per TTS counselor Odetta Pink, [facility] patient denied SI/HI/AVH. No complaint of psychiatric or medical complaint. "Clinician asked the pt, "what brought you to [facility]?" Pt replied, "I don't know.Marland KitchenMarland KitchenI just don't know." Clinician asked the pt to take a moment to think about it. Clinician then asked the pt what type of treatment was he seeking coming to [facility]. Pt reports, "I need somewhere to sleep." After many prompts pt reports, he lives alone but would not tell clinician where he stays. Pt reports, he does not see a therapist, psychiatrist or takes medications. Pt denies, SI, HI, AVH, self-injurious behaviors and access to weapons. ?

## 2022-01-23 NOTE — ED Provider Notes (Signed)
Behavioral Health Urgent Care Medical Screening Exam ? 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 ? ?History of Present illness: [name] is a 31 y.o. male patient presented to [facility] as a walk in alone. States he needs somewhere to sleep.  ? ?[name], 31 y.o., male patient seen face to face by this provider, consulted with Dr. Serafina Mitchell; and chart reviewed on 01/23/22. Per chart review patient is well known the the psychiatric service. He is diagnosed with paranoid shizophrenia. He hs been seen numerous time with similar presentations. He presented earlier this morning to [facility] but left before being seen.  ? ?Contacted Denesha with [employer] [phone]. States patient has declined ACTT services and he has been discharged from their services.  Patient states he does not take any medications ? ?During evaluation [name] is in the assessment room with the lights off at the door shut.  He is asleep but is easily awakened.  He is alert/oriented to person, place, and year.  He is disheveled and makes fair eye contact.  He answers most questions by saying "I do not know".  He has a flat affect.  He denies SI/HI/AVH.  He does not appear to be responding to internal/external stimuli.  He also denies paranoia. He appears to be at baseline.  Buss pass was offered and patient refused. ? ?At this time [name] is educated and verbalizes understanding of mental health resources and other crisis services in the community. He is instructed to call 911 and present to the nearest emergency room should he experience any suicidal/homicidal ideation, auditory/visual/hallucinations, or detrimental worsening of his mental health condition.  He was a also advised by Probation officer that He could call the toll-free phone on insurance card to assist with identifying in  network counselors and agencies or number on back of Medicaid card t speak with care coordinator ?  ? ?Psychiatric Specialty Exam ? ?Presentation  ?General Appearance:Disheveled ? ?Eye Contact:Fair ? ?Speech:Clear and Coherent; Normal Rate ? ?Speech Volume:Normal ? ?Handedness:Right ? ? ?Mood and Affect  ?Mood:Dysphoric ? ?Affect:Flat ? ? ?Thought Process  ?Thought Processes:Coherent ? ?Descriptions of Associations:Intact ? ?Orientation:Full (Time, Place and Person) ? ?Thought Content:Logical ? Diagnosis of Schizophrenia or Schizoaffective disorder in past: Yes ? Duration of Psychotic Symptoms: Greater than six months ? Hallucinations:None ?talk to himself ? ?Ideas of Reference:None ? ?Suicidal Thoughts:No ? ?Homicidal Thoughts:No ? ? ?Sensorium  ?Memory:Immediate Fair; Recent Fair; Remote Fair ? ?Judgment:Fair ? ?Insight:Fair ? ? ?Executive Functions  ?Concentration:Fair ? ?Attention Span:Fair ? ?Recall:Fair ? ?[place] ? ?Language:Fair ? ? ?Psychomotor Activity  ?Psychomotor Activity:Normal ? ? ?Assets  ?Assets:Physical Health; Resilience; Financial Resources/Insurance ? ? ?Sleep  ?Sleep:Fair ? ?Number of hours: -1 ? ? ?No data recorded ? ?Physical Exam: ?Physical Exam ?Vitals and nursing note reviewed.  ?Constitutional:   ?   General: He is not in acute distress. ?   Appearance: He is well-developed.  ?HENT:  ?   Head: Normocephalic.  ?Eyes:  ?   Conjunctiva/sclera: Conjunctivae normal.  ?Cardiovascular:  ?   Rate and Rhythm: Normal rate.  ?   Heart sounds: No murmur heard. ?Pulmonary:  ?   Effort: Pulmonary effort is normal. No respiratory distress.  ?Musculoskeletal:     ?   General: No swelling.  ?Skin: ?   Coloration: Skin is not jaundiced or  pale.  ?Neurological:  ?   Mental Status: He is alert. Mental status is at baseline.  ?Psychiatric:     ?   Mood and Affect: Mood normal.  ? ?Review of Systems  ?Constitutional: Negative.   ?HENT: Negative.    ?Eyes: Negative.   ?Respiratory: Negative.     ?Cardiovascular: Negative.   ?Musculoskeletal: Negative.   ?Skin: Negative.   ?Neurological: Negative.   ?Psychiatric/Behavioral: Negative.    ?Blood pressure 125/76, pulse 76, temperature 98.7 ?F (37.1 ?C), temperature source Oral, resp. rate 18, SpO2 100 %. There is no height or weight on file to calculate BMI. ? ?Musculoskeletal: ?Strength & Muscle Tone: within normal limits ?Gait & Station: normal ?Patient leans: N/A ? ? ?[facility] MSE Discharge Disposition for Follow up and Recommendations: ?Based on my evaluation the patient does not appear to have an emergency medical condition and can be discharged with resources and follow up care in outpatient services for Medication Management and Individual Therapy ? ?Discharge patient ? ?Discussed outpatient services on the second floor including open access walk in hours.  ? ?No evidence of imminent risk to self or others at present.    ?Patient does not meet criteria for psychiatric inpatient admission. ?Discussed crisis plan, support from social network, calling 911, coming to the Emergency Department, and calling Suicide Hotline.  ? ? ?Revonda Humphrey, NP ?01/23/2022, 7:56 AM ? ?

## 2022-01-23 NOTE — BH Assessment (Signed)
Clinician looked in pt's clinical notes and located pt's aunt [name], [phone] and [phone]) phone numbers.  ? ?Clinician called the cell number and received the messaged. the phone is not in service.  ? ?Clinician called the home phone number and receive a message, the call can not be completed at this time.  ? ?*Per [name], NP note on 01/20/2022: "[name] called this provider back to informed that the patient is no longer authorized for services after he refused services, an assessment and  was uncooperative on 01/18/22. She states that she is unable to pick patient up and he may be released. She states that when the patient is ready for services he can call [phone]. She states that the patient is not homeless and that he lives at [address] in [place], [place]. Went to inform patient of plan and he had already left the lobby. * ? ? ?Redmond Pulling, MS, [facility], [facility] ?Triage Specialist ?[phone] ? ?

## 2022-01-23 NOTE — Progress Notes (Signed)
?   01/23/22 0350  ?Patient Reported Information  ?How Did You Hear About [place]? Self  ?What Is the Reason for Your Visit/Call Today? Clinician asked the pt, "what brought you to [facility]?" Pt replied, "I don't know.Marland KitchenMarland KitchenI just don't know." Clinician asked the pt to take a moment to think about it. Clinician then asked the pt what type of treatment was he seeking coming to [facility]. Pt reports, "I need somewhere to sleep." After many prompts pt reports, he lives alone but would not tell clinician where he stays. Pt reports, he does not see a therapist, psychiatrist or takes medications. Pt denies, SI, HI, AVH, self-injurious behaviors and access to weapons.  ?How Long Has This Been Causing You Problems? > than 6 months  ?What Do You Feel Would Help You the Most Today? Medication(s);Housing Assistance  ?Have You Recently Had Any Thoughts About Hurting Yourself? No ?(Pt denies.)  ?Are You Planning to Commit Suicide/Harm Yourself At This time? No ?(Pt denies.)  ?Have you Recently Had Thoughts About Hurting Someone [name]? No ?(Pt denies.)  ?Are You Planning To Harm Someone At This Time? No ?(Pt denies.)  ?Have You Used Any Alcohol or Drugs in the Past 24 Hours? No ?(Pt denies.)  ?What Did You Use and How Much? NA  ?Do You Currently Have a Therapist/Psychiatrist? No ?(Pt denies.)  ?CCA Screening Triage Referral Assessment  ?Type of Contact Face-to-Face  ?Location of Assessment [facility] Assessment Services  ?Provider location [facility] Assessment Services  ?Collateral Involvement Pt consented for clinician to contact his aunt, [name], however he does not know her contact information. Clinician checked the pt's chart and there's no collateral contact information.  ?Patient Determined To Be At Risk for Harm To Self or Others Based on Review of Patient Reported Information or Presenting Complaint? No  ?Does Patient Present under Involuntary Commitment? No  ?[place] of Residence [place]  ?Patient Currently Receiving the Following  Services: Not Receiving Services  ?Determination of Need Routine (7 days)  ?Options For Referral Medication Management;Outpatient Therapy  ? ? ?Determination of need: Routine.  ? ? ?Redmond Pulling, MS, [facility], [facility] ?Triage Specialist ?[phone] ? ?

## 2022-01-25 ENCOUNTER — Ambulatory Visit (HOSPITAL_COMMUNITY)
Admission: EM | Admit: 2022-01-25 | Discharge: 2022-01-25 | Discharge status: Against Medical Advice | Payer: Medicaid Other

## 2022-01-25 NOTE — ED Notes (Signed)
Per front desk, pt walked out of the building and left the property. Pt left without being seen by provider after triage.  ?

## 2022-01-25 NOTE — Progress Notes (Signed)
TRIAGE: ROUTINE ? ?Of note, pt left without seeing a provider; the MSE form was signed by pt prior to him leaving. ? ? 01/25/22 0635  ?[facility] Triage Screening (Walk-ins at [facility] only)  ?How Did You Hear About [place]? Self  ?What Is the Reason for Your Visit/Call Today? Pt shared concerns that he was having trouble seeing. He was unable to verify how long he has been experiencing vision difficulties but was able to verify that he walked to the [facility] tonight. Clinician inquired as to whether pt thought he would benefit from going to the hospital to have his eyes checked but pt denied thinking this was necessary. Pt denied SI, HI, and SA.  ?How Long Has This Been Causing You Problems?  ?(Pt was unable to verify)  ?Have You Recently Had Any Thoughts About Hurting Yourself? No  ?Are You Planning to Commit Suicide/Harm Yourself At This time? No  ?Have you Recently Had Thoughts About Hurting Someone [name]? No  ?Are You Planning To Harm Someone At This Time? No  ?Are you currently experiencing any auditory, visual or other hallucinations? No  ?Please explain the hallucinations you are currently experiencing: N/A  ?Have You Used Any Alcohol or Drugs in the Past 24 Hours? No  ?How long ago did you use Drugs or Alcohol? N/A  ?What Did You Use and How Much? N/A  ?Do you have any current medical co-morbidities that require immediate attention? No  ?Clinician description of patient physical appearance/behavior: Pt was covering his face with his hand, almost to block out the lights from above. He was blunt but was able to answer the questions posed.  ?What Do You Feel Would Help You the Most Today?  ?(Pt states he does not know)  ?If access to [facility] was not available, would you have sought care in the Emergency Department? No  ?Determination of Need Routine (7 days)  ?Options For Referral Medication Management;Outpatient Therapy  ? ? ?

## 2022-01-27 ENCOUNTER — Telehealth (HOSPITAL_COMMUNITY): Payer: Self-pay

## 2022-01-27 NOTE — BH Assessment (Signed)
Care Management - [facility] Follow Up Discharges  ? ?Writer attempted to make contact with patient today and was unsuccessful.  The phone number listed in epic is not a working number.  ? ?Per chart review, the patient walked out of the building and left the property. Patient left without being seen by provider after triage.   ?

## 2022-01-29 ENCOUNTER — Other Ambulatory Visit: Payer: Self-pay

## 2022-01-29 ENCOUNTER — Ambulatory Visit (HOSPITAL_COMMUNITY)
Admission: EM | Admit: 2022-01-29 | Discharge: 2022-01-30 | Disposition: A | Discharge status: Home / Self Care | Payer: Medicaid Other | Attending: Urology | Admitting: Urology

## 2022-01-29 ENCOUNTER — Encounter (HOSPITAL_COMMUNITY): Payer: Self-pay

## 2022-01-29 ENCOUNTER — Emergency Department (HOSPITAL_COMMUNITY)
Admission: EM | Admit: 2022-01-29 | Discharge: 2022-01-29 | Disposition: A | Discharge status: Home / Self Care | Payer: Medicaid - Out of State | Attending: Emergency Medicine | Admitting: Emergency Medicine

## 2022-01-29 ENCOUNTER — Emergency Department (HOSPITAL_COMMUNITY): Payer: Medicaid - Out of State

## 2022-01-29 DIAGNOSIS — J45909 Unspecified asthma, uncomplicated: Secondary | ICD-10-CM | POA: Diagnosis not present

## 2022-01-29 DIAGNOSIS — R062 Wheezing: Secondary | ICD-10-CM | POA: Diagnosis present

## 2022-01-29 DIAGNOSIS — F200 Paranoid schizophrenia: Secondary | ICD-10-CM | POA: Insufficient documentation

## 2022-01-29 MED ORDER — ALBUTEROL SULFATE HFA 108 (90 BASE) MCG/ACT IN AERS
2.0000 | INHALATION_SPRAY | Freq: Once | RESPIRATORY_TRACT | Status: DC
Start: 2022-01-29 — End: 2022-01-29
  Filled 2022-01-29: qty 6.7

## 2022-01-29 NOTE — ED Triage Notes (Signed)
Pt BIB EMS with reports of wheezing and SHOB.  ?

## 2022-01-29 NOTE — ED Notes (Signed)
Pt states "I don't want the inhaler" Pt refused d/c vital signs. ?

## 2022-01-29 NOTE — Progress Notes (Addendum)
TRIAGE: ROUTINE ?Leandro Reasoner, NP, reviewed pt's chart and information and met with pt face-to-face and determined pt can be psych cleared. ? ? 01/29/22 2255  ?[place] Triage Screening (Walk-ins at [facility] only)  ?How Did You Hear About [place]? Self  ?What Is the Reason for Your Visit/Call Today? Pt came into the lobby but, when speaking to Patient Access (registration) he was unable to identify his needs. Clinician inquired as to whether pt is need of medical help and needs assistance calling 911 or if he needs to talk to someone. Pt denied medical needs, stating he needs to talk to someone. Clinician inquired as to whether pt would talk to clinician; he agreed to this and allowed Patient Access to register him. In the triage room, pt stated that his brother was supposed to take him home but that he instead left him. Clinician inquired if his brother was supposed to take him to his apartment in [facility] but pt denied this, stating his brother was supposed to take him to [place]. Clinician inquired as to when this happened; he stated he didn't know. Clinician inquired as to whether pt walked to the [facility] or was brought here by someone; pt stated someone brought him but he was unable to inform clinician who. Pt was rubbing his eyes frequently and repeated "I don't know" to most questions posed. Pt denies SI and HI; he responded "I don't know" when asked if he has been experiencing AVH or if he has used substances. At one point pt stated, "I don't even know if you..." but did not finish his sentence and was unable to finish his sentence when clinician asked.  ?How Long Has This Been Causing You Problems? > than 6 months  ?Have You Recently Had Any Thoughts About Hurting Yourself? No  ?Are You Planning to Commit Suicide/Harm Yourself At This time? No  ?Have you Recently Had Thoughts About [place]? No  ?Are You Planning To Harm Someone At This Time? No  ?Are you currently experiencing any auditory, visual or other  hallucinations?  ?(Pt states, "I don't know.")  ?Please explain the hallucinations you are currently experiencing: Pt states, "I don't know."  ?Have You Used Any Alcohol or Drugs in the Past 24 Hours?  ?(Pt states, "I don't know.")  ?How long ago did you use Drugs or Alcohol? Pt states, "I don't know."  ?What Did You Use and How Much? Pt states, "I don't know."  ?Clinician description of patient physical appearance/behavior: Pt was able to request help and was initially able to provide clinician some information but was then unable to answer questions, stating, "I don't know." Pt is dressed in appropriate attire.  ?What Do You Feel Would Help You the Most Today?  ?(Pt states, "I don't know.")  ?If access to [facility] was not available, would you have sought care in the Emergency Department? Yes  ?Determination of Need Urgent (48 hours)  ?Options For Referral Medication Management;Outpatient Therapy;BH Urgent Care  ? ? ?

## 2022-01-29 NOTE — ED Provider Notes (Signed)
?[facility]-EMERGENCY DEPT ?Provider Note ? ? ?CSN: [id number] ?Arrival date & time: 01/29/22  1497 ? ?  ? ?History ? ?Chief Complaint  ?Patient presents with  ? Wheezing  ? ? ?[name] is a 31 y.o. male. ? ?Patient brought in by EMS apparently was having significant wheezing.  Patient states he feels better now.  We assume that EMS had given him a treatment but not able to verify that since he arrived prior to the time that I got here or the current nurses got here.  Patient states he has a history of asthma and states he is not using albuterol.  But he also has a history of schizophrenia.  And seen frequently for behavioral health issues.  Oxygen saturation 93% on arrival respiratory rate 16 temp 98.  Patient denies any cough or fevers. ? ? ?  ? ?Home Medications ?Prior to Admission medications   ?Medication Sig Start Date End Date Taking? Authorizing Provider  ?paliperidone (INVEGA) 6 MG 24 hr tablet Take 1 tablet (6 mg total) by mouth daily for 14 days. ?Patient not taking: No sig reported 01/02/21 02/22/21  Mare Ferrari, PA-C  ?   ? ?Allergies    ?Shellfish allergy and Bee pollen   ? ?Review of Systems   ?Review of Systems  ?Constitutional:  Negative for chills and fever.  ?HENT:  Negative for ear pain and sore throat.   ?Eyes:  Negative for pain and visual disturbance.  ?Respiratory:  Positive for wheezing. Negative for cough and shortness of breath.   ?Cardiovascular:  Negative for chest pain and palpitations.  ?Gastrointestinal:  Negative for abdominal pain and vomiting.  ?Genitourinary:  Negative for dysuria and hematuria.  ?Musculoskeletal:  Negative for arthralgias and back pain.  ?Skin:  Negative for color change and rash.  ?Neurological:  Negative for seizures and syncope.  ?All other systems reviewed and are negative. ? ?Physical Exam ?Updated Vital Signs ?BP 135/76 (BP Location: Right Arm)   Pulse 97   Temp 98 ?F (36.7 ?C) (Oral)   Resp 16   Ht 1.651 m (5\' 5" )   Wt 72.6 kg    SpO2 93%   BMI 26.63 kg/m?  ?Physical Exam ?Vitals and nursing note reviewed.  ?Constitutional:   ?   General: He is not in acute distress. ?   Appearance: Normal appearance. He is well-developed. He is not ill-appearing.  ?HENT:  ?   Head: Normocephalic and atraumatic.  ?Eyes:  ?   Conjunctiva/sclera: Conjunctivae normal.  ?Cardiovascular:  ?   Rate and Rhythm: Normal rate and regular rhythm.  ?   Heart sounds: No murmur heard. ?Pulmonary:  ?   Effort: Pulmonary effort is normal. No respiratory distress.  ?   Breath sounds: Normal breath sounds. No stridor. No wheezing, rhonchi or rales.  ?Chest:  ?   Chest wall: No tenderness.  ?Abdominal:  ?   Palpations: Abdomen is soft.  ?   Tenderness: There is no abdominal tenderness.  ?Musculoskeletal:     ?   General: No swelling.  ?   Cervical back: Neck supple.  ?Skin: ?   General: Skin is warm and dry.  ?   Capillary Refill: Capillary refill takes less than 2 seconds.  ?Neurological:  ?   Mental Status: He is alert.  ?Psychiatric:     ?   Mood and Affect: Mood normal.  ? ? ?ED Results / Procedures / Treatments   ?Labs ?(all labs ordered are listed, but  only abnormal results are displayed) ?Labs Reviewed - No data to display ? ?EKG ?None ? ?Radiology ?DG Chest Portable 1 View ? ?Result Date: 01/29/2022 ?CLINICAL DATA:  31 year old male with history of shortness of breath and wheezing. EXAM: PORTABLE CHEST 1 VIEW COMPARISON:  Chest x-ray 01/05/2022. FINDINGS: Lung volumes are low. No consolidative airspace disease. No pleural effusions. No pneumothorax. No pulmonary nodule or mass noted. Pulmonary vasculature and the cardiomediastinal silhouette are within normal limits. IMPRESSION: 1. Low lung volumes without radiographic evidence of acute cardiopulmonary disease. Electronically Signed   By: Trudie Reed M.D.   On: 01/29/2022 07:45   ? ?Procedures ?Procedures  ? ? ?Medications Ordered in ED ?Medications  ?albuterol (VENTOLIN HFA) 108 (90 Base) MCG/ACT inhaler 2  puff (2 puffs Inhalation Given 01/29/22 0752)  ? ? ?ED Course/ Medical Decision Making/ A&P ?  ?                        ?Medical Decision Making ?Amount and/or Complexity of Data Reviewed ?Radiology: ordered. ? ?Risk ?Prescription drug management. ? ? ?Patient without any wheezing currently.  Chest x-ray without any acute findings. ? ?We will provide patient with albuterol inhaler and have him do 2 puffs every 6 hours for the next 7 days. ? ? ?Final Clinical Impression(s) / ED Diagnoses ?Final diagnoses:  ?Reactive airway disease without complication, unspecified asthma severity, unspecified whether persistent  ? ? ?Rx / DC Orders ?ED Discharge Orders   ? ? None  ? ?  ? ? ?  ?Vanetta Mulders, MD ?01/29/22 [phone] ? ?

## 2022-01-29 NOTE — Discharge Instructions (Addendum)
Use albuterol inhaler 2 puffs every 6 hours for the next 7 days.  Then you can use it as needed.  Make an appointment follow-up with the wellness clinic.  Return for any new or worse symptoms. ?

## 2022-01-29 NOTE — Progress Notes (Deleted)
TRIAGE: URGENT ? ? 01/29/22 2255  ?[place] Triage Screening (Walk-ins at [facility] only)  ?How Did You Hear About [place]? Self  ?What Is the Reason for Your Visit/Call Today? Pt came into the lobby but, when speaking to Patient Access (registration) he was unable to identify his needs. Clinician inquired as to whether pt is need of medical help and needs assistance calling 911. Pt denied this, stating he needs to talk to someone. Clinician inquired as to whether pt would talk to clinician; he agreed to this and allowed Patient Access to register him. In the triage room, pt stated that his brother was supposed to take him home but that he instead left him. Clinician inquired if his brother was supposed to take him to his apartment in [facility] but pt denied this, stating his brother was supposed to take him to [place]. Clinician inquired as to when this happened; he stated he didn't know. Clinician inquired as to whether pt walked to the [facility] or was brought here by someone; pt stated someone brought him but he was unable to inform clinician who. Pt was rubbing his eyes frequently and repeated "I don't know" to most questions posed. Pt denies SI and HI; he responded "I don't know" when asked if he has been experiencing AVH or if he has used substances. At one point pt stated, "I don't even know if you..." but did not finish his sentence and was unable to finish his sentence when clinician asked.  ?How Long Has This Been Causing You Problems? > than 6 months  ?Have You Recently Had Any Thoughts About Hurting Yourself? No  ?Are You Planning to Commit Suicide/Harm Yourself At This time? No  ?Have you Recently Had Thoughts About [place]? No  ?Are You Planning To Harm Someone At This Time? No  ?Are you currently experiencing any auditory, visual or other hallucinations?  ?(Pt states, "I don't know.")  ?Please explain the hallucinations you are currently experiencing: Pt states, "I don't know."  ?Have You Used Any  Alcohol or Drugs in the Past 24 Hours?  ?(Pt states, "I don't know.")  ?How long ago did you use Drugs or Alcohol? Pt states, "I don't know."  ?What Did You Use and How Much? Pt states, "I don't know."  ?Clinician description of patient physical appearance/behavior: Pt was able to request help and was initially able to provide clinician some information but was then unable to answer questions, stating, "I don't know." Pt is dressed in appropriate attire.  ?What Do You Feel Would Help You the Most Today?  ?(Pt states, "I don't know.")  ?If access to [facility] was not available, would you have sought care in the Emergency Department? Yes  ?Determination of Need Urgent (48 hours)  ?Options For Referral Medication Management;Outpatient Therapy;BH Urgent Care  ? ? ?

## 2022-01-29 NOTE — ED Notes (Signed)
I provided reinforced discharge education based off of discharge instructions. Pt acknowledged and understood my education. Pt had no further questions/concerns for provider/myself.  °

## 2022-01-30 NOTE — ED Provider Notes (Signed)
Behavioral Health Urgent Care Medical Screening Exam ? ?Patient Name: [name] ?MRN: [id number] ?Date of Evaluation: 01/30/22 ?Chief Complaint:   ?Diagnosis:  ?Final diagnoses:  ?Paranoid schizophrenia (HCC)  ? ? ?History of Present illness: [name] is a 31 y.o. male with psychiatric history of paranoid schizophrenia.  Patient presented voluntarily to [facility] for a walk-in assessment. ? ?Patient is assessed face-to-face and his chart was reviewed by this nurse practitioner.  Per chart, patient was evaluated earlier today (01/29/22) at [facility] for wheezing.  Patient denies current respiratory distress or any other medical complaints at this time.  He is alert and oriented x4.  Patient is calm and cooperative with good eye contact.  Patient's speech is clear, coherent, normal pace and rate.  Patient denies suicidal ideation, homicidal ideation, hallucination, paranoia, and substance abuse. ? ?Patient states that he came to [facility] because he is tired and wants to sleep.  He responds "I do not know" to most assessment questions.  He denies psychiatric/medical complaints.  Patient is well known to [facility] health psychiatric services; he appears to be at his baseline.  Per chart review, he was recently discharged from [facility]; therefore patient does not have current ACT team.  He is unsure of where he lives received long-acting injections.  He says he is not on any psychotropics at this time.  Patient states he is not interested in restarting medication at this time.  He denies paranoia and AVH.  No evidence of psychosis, distractibility, preoccupation, or delusional thought content noted during interaction. ? ?This Clinical research associate contacted [facility] ACT team [phone]) to inquire about reestablishing services but no answer.  Writer provided patient with contact information for [name] to reestablish care.  ? ?Psychiatric Specialty Exam ? ?Presentation  ?General Appearance:Appropriate for Environment ? ?Eye  Contact:Good ? ?Speech:Clear and Coherent ? ?Speech Volume:Normal ? ?Handedness:Right ? ? ?Mood and Affect  ?Mood:Euthymic ? ?Affect:Congruent ? ? ?Thought Process  ?Thought Processes:Coherent ? ?Descriptions of Associations:Intact ? ?Orientation:Full (Time, Place and Person) ? ?Thought Content:WDL ? Diagnosis of Schizophrenia or Schizoaffective disorder in past: Yes ? Duration of Psychotic Symptoms: Greater than six months ? Hallucinations:None ?talk to himself ? ?Ideas of Reference:None ? ?Suicidal Thoughts:No ? ?Homicidal Thoughts:No ? ? ?Sensorium  ?Memory:Immediate Fair; Recent Poor; Remote Poor ? ?Judgment:Poor ? ?Insight:Poor ? ? ?Executive Functions  ?Concentration:Good ? ?Attention Span:Good ? ?Recall:Fair ? ?Fund of Knowledge:Fair ? ?Language:Fair ? ? ?Psychomotor Activity  ?Psychomotor Activity:Normal ? ? ?Assets  ?Assets:Desire for Improvement; Physical Health; Housing ? ? ?Sleep  ?Sleep:Fair ? ?Number of hours: 5 ? ? ?No data recorded ? ?Physical Exam: ?Physical Exam ?Vitals and nursing note reviewed.  ?Constitutional:   ?   General: He is not in acute distress. ?   Appearance: He is well-developed.  ?HENT:  ?   Head: Normocephalic and atraumatic.  ?Eyes:  ?   Conjunctiva/sclera: Conjunctivae normal.  ?Cardiovascular:  ?   Rate and Rhythm: Normal rate.  ?Pulmonary:  ?   Effort: Pulmonary effort is normal. No respiratory distress.  ?   Breath sounds: Normal breath sounds.  ?Abdominal:  ?   Palpations: Abdomen is soft.  ?   Tenderness: There is no abdominal tenderness.  ?Musculoskeletal:     ?   General: No swelling.  ?   Cervical back: Neck supple.  ?Skin: ?   General: Skin is warm and dry.  ?   Capillary Refill: Capillary refill takes less than 2 seconds.  ?Neurological:  ?   Mental  Status: He is alert and oriented to person, place, and time.  ?Psychiatric:     ?   Attention and Perception: Attention and perception normal.     ?   Mood and Affect: Mood normal.     ?   Speech: Speech normal.     ?    Behavior: Behavior normal. Behavior is cooperative.     ?   Thought Content: Thought content normal.     ?   Cognition and Memory: Cognition normal.  ? ?Review of Systems  ?Constitutional: Negative.   ?HENT: Negative.    ?Eyes: Negative.   ?Respiratory: Negative.    ?Cardiovascular: Negative.   ?Gastrointestinal: Negative.   ?Genitourinary: Negative.   ?Musculoskeletal: Negative.   ?Skin: Negative.   ?Neurological: Negative.   ?Endo/Heme/Allergies: Negative.   ?Psychiatric/Behavioral: Negative.    ?Blood pressure 139/88, pulse 73, temperature 98.3 ?F (36.8 ?C), temperature source Oral, resp. rate 18, SpO2 99 %. There is no height or weight on file to calculate BMI. ? ?Musculoskeletal: ?Strength & Muscle Tone: within normal limits ?Gait & Station: normal ?Patient leans: Right ? ? ?[facility] MSE Discharge Disposition for Follow up and Recommendations: ?Based on my evaluation the patient does not appear to have an emergency medical condition and can be discharged with resources and follow up care in outpatient services for Medication Management and Individual Therapy ? ?This Clinical research associate contacted [employer] ACT team [phone]) to inquire about reestablishing services but no answer.  Writer provided patient with contact information for [name] to reestablish care.  ? ?Maricela Bo, NP ?01/30/2022, 12:20 AM ? ?

## 2022-01-30 NOTE — Discharge Instructions (Signed)

## 2022-02-01 ENCOUNTER — Ambulatory Visit (HOSPITAL_COMMUNITY)
Admission: EM | Admit: 2022-02-01 | Discharge: 2022-02-01 | Disposition: A | Discharge status: Home / Self Care | Payer: Medicaid Other | Attending: Psychiatry | Admitting: Psychiatry

## 2022-02-01 DIAGNOSIS — F200 Paranoid schizophrenia: Secondary | ICD-10-CM | POA: Insufficient documentation

## 2022-02-01 DIAGNOSIS — Z91148 Patient's other noncompliance with medication regimen for other reason: Secondary | ICD-10-CM | POA: Insufficient documentation

## 2022-02-01 NOTE — Discharge Instructions (Addendum)
Follow up with ACT for medication compliance  ?

## 2022-02-01 NOTE — ED Provider Notes (Signed)
Behavioral Health Urgent Care Medical Screening Exam ? ?Patient Name: [name] ?MRN: [id number] ?Date of Evaluation: 02/01/22 ?Chief Complaint:   ?Diagnosis:  ?Final diagnoses:  ?Paranoid schizophrenia, chronic condition (HCC)  ?Noncompliance with medications  ? ? ?History of Present illness: [name] is a 31 y.o. male. Present to [facility],  stating he just want to rest,  pt is a frequent visitor to the ER.  Pt has a history of paranoia schizophrenia  disorder. Pt was seen at [facility] 01/29/22 and was discharge.   ? ?NP saw patient face to face,  Pt denies SI,  AVH,  HI,  or active psychosis,  pt keep saying I  didn't want to talk at this time, I just wanna sleep.  Doesn't appear to be in any distress or immediate danger.  Pt made aware that if he want to talk more with the NP he can just let know.  Pt in triage room 136 sleeping.  ? ?Recommend:  Discharge   ? ?Psychiatric Specialty Exam ? ?Presentation  ?General Appearance:Appropriate for Environment ? ?Eye Contact:Fair ? ?Speech:Clear and Coherent ? ?Speech Volume:Decreased ? ?Handedness:Ambidextrous ? ? ?Mood and Affect  ?Mood:Anxious ? ?Affect:Flat ? ? ?Thought Process  ?Thought Processes:Coherent ? ?Descriptions of Associations:Circumstantial ? ?Orientation:Full (Time, Place and Person) ? ?Thought Content:WDL ? Diagnosis of Schizophrenia or Schizoaffective disorder in past: Yes ? Duration of Psychotic Symptoms: Greater than six months ? Hallucinations:None ?talk to himself ? ?Ideas of Reference:None ? ?Suicidal Thoughts:No ? ?Homicidal Thoughts:No ? ? ?Sensorium  ?Memory:Immediate Fair ? ?Judgment:Fair ? ?Insight:Fair ? ? ?Executive Functions  ?Concentration:Fair ? ?Attention Span:Good ? ?Recall:Fair ? ?Fund of Knowledge:Fair ? ?Language:Fair ? ? ?Psychomotor Activity  ?Psychomotor Activity:Normal ? ? ?Assets  ?Assets:Physical Health; Desire for Improvement ? ? ?Sleep  ?Sleep:Fair ? ?Number of hours: 5 ? ? ?Nutritional Assessment (For OBS and FBC admissions  only) ?Has the patient had a weight loss or gain of 10 pounds or more in the last 3 months?: No ?Has the patient had a decrease in food intake/or appetite?: No ?Does the patient have dental problems?: No ?Does the patient have eating habits or behaviors that may be indicators of an eating disorder including binging or inducing vomiting?: No ?Has the patient recently lost weight without trying?: 0 ?Has the patient been eating poorly because of a decreased appetite?: 0 ?Malnutrition Screening Tool Score: 0 ? ? ? ?Physical Exam: ?Physical Exam ?HENT:  ?   Head: Normocephalic.  ?   Nose: Nose normal.  ?Cardiovascular:  ?   Rate and Rhythm: Normal rate.  ?Pulmonary:  ?   Effort: Pulmonary effort is normal.  ?Musculoskeletal:     ?   General: Normal range of motion.  ?   Cervical back: Normal range of motion.  ?Skin: ?   General: Skin is warm.  ?Neurological:  ?   General: No focal deficit present.  ?   Mental Status: He is alert.  ?Psychiatric:     ?   Behavior: Behavior normal.     ?   Thought Content: Thought content normal.     ?   Judgment: Judgment normal.  ? ?Review of Systems  ?Constitutional: Negative.   ?HENT: Negative.    ?Eyes: Negative.   ?Respiratory: Negative.    ?Cardiovascular: Negative.   ?Gastrointestinal: Negative.   ?Genitourinary: Negative.   ?Musculoskeletal: Negative.   ?Skin: Negative.   ?Neurological: Negative.   ?Endo/Heme/Allergies: Negative.   ?Psychiatric/Behavioral: Negative.    ?Blood pressure (!) 113/91, pulse 63, temperature  98.9 ?F (37.2 ?C), temperature source Oral, resp. rate 20, SpO2 100 %. There is no height or weight on file to calculate BMI. ? ?Musculoskeletal: ?Strength & Muscle Tone: within normal limits ?Gait & Station: normal ?Patient leans: N/A ? ? ?[facility] MSE Discharge Disposition for Follow up and Recommendations: ?Based on my evaluation the patient does not appear to have an emergency medical condition and can be discharged with resources and follow up care in outpatient  services for Medication Management ?Pt to follow up with [facility] ACT team  ? ? ?Sindy Guadeloupe, NP ?02/01/2022, 5:36 AM ? ?

## 2022-02-01 NOTE — Progress Notes (Signed)
TRIAGE: ROUTINE ? ? 02/01/22 0125  ?[facility] Triage Screening (Walk-ins at [facility] only)  ?How Did You Hear About [place]? Self  ?What Is the Reason for Your Visit/Call Today? Pt states, "I need to lie down. I'm scared." Pt is covering his eyes/face with his arms, almost as if to keep from seeing what is in front of him. Pt was able to verify that he walked to the [facility]. When asked if he was able to make contact with his brother, he stated, "I don't know." Clinician offered to make contact with pt's brother, if he provided his brother's phone number/allowed clinician to look in his phone for his brother's phone number. Pt again stated, "I don't know." Pt denies SI, HI, AVH, NSSIB, access to guns/weapons, engagement with the legal system, and SA.  ?How Long Has This Been Causing You Problems? > than 6 months  ?Have You Recently Had Any Thoughts About Hurting Yourself? No  ?Are You Planning to Commit Suicide/Harm Yourself At This time? No  ?Have you Recently Had Thoughts About Hurting Someone [name]? No  ?Are You Planning To Harm Someone At This Time? No  ?Are you currently experiencing any auditory, visual or other hallucinations? No  ?Please explain the hallucinations you are currently experiencing: N/A  ?Have You Used Any Alcohol or Drugs in the Past 24 Hours? No  ?How long ago did you use Drugs or Alcohol? N/A  ?What Did You Use and How Much? N/A  ?Do you have any current medical co-morbidities that require immediate attention? No  ?Clinician description of patient physical appearance/behavior: Pt is dressed in appropriate attire. He answers come of the questions posed but stated, "I don't know," to other questions.  ?What Do You Feel Would Help You the Most Today?  ?(Pt states he does not know.)  ?If access to [facility] was not available, would you have sought care in the Emergency Department? Yes  ?Determination of Need Routine (7 days)  ?Options For Referral Medication Management;Outpatient Therapy  ? ? ?

## 2022-02-02 ENCOUNTER — Ambulatory Visit (HOSPITAL_COMMUNITY)
Admission: EM | Admit: 2022-02-02 | Discharge: 2022-02-02 | Disposition: A | Discharge status: Home / Self Care | Payer: Medicaid Other | Attending: Psychiatry | Admitting: Psychiatry

## 2022-02-02 ENCOUNTER — Encounter (HOSPITAL_COMMUNITY): Payer: Self-pay

## 2022-02-02 ENCOUNTER — Emergency Department (HOSPITAL_COMMUNITY)
Admission: EM | Admit: 2022-02-02 | Discharge: 2022-02-02 | Disposition: A | Discharge status: Home / Self Care | Payer: Medicaid - Out of State | Attending: Emergency Medicine | Admitting: Emergency Medicine

## 2022-02-02 ENCOUNTER — Other Ambulatory Visit: Payer: Self-pay

## 2022-02-02 ENCOUNTER — Emergency Department (HOSPITAL_COMMUNITY)
Admission: EM | Admit: 2022-02-02 | Discharge: 2022-02-03 | Disposition: A | Discharge status: Home / Self Care | Payer: Medicaid - Out of State | Source: Home / Self Care | Attending: Emergency Medicine | Admitting: Emergency Medicine

## 2022-02-02 DIAGNOSIS — F200 Paranoid schizophrenia: Secondary | ICD-10-CM | POA: Insufficient documentation

## 2022-02-02 DIAGNOSIS — Z91199 Patient's noncompliance with other medical treatment and regimen due to unspecified reason: Secondary | ICD-10-CM | POA: Insufficient documentation

## 2022-02-02 DIAGNOSIS — H538 Other visual disturbances: Secondary | ICD-10-CM | POA: Insufficient documentation

## 2022-02-02 DIAGNOSIS — H579 Unspecified disorder of eye and adnexa: Secondary | ICD-10-CM | POA: Insufficient documentation

## 2022-02-02 DIAGNOSIS — H539 Unspecified visual disturbance: Secondary | ICD-10-CM

## 2022-02-02 NOTE — ED Provider Notes (Signed)
Behavioral Health Urgent Care Medical Screening Exam ? ?Patient Name: [name] ?MRN: [id number] ?Date of Evaluation: 02/02/22 ?Chief Complaint:   ?Diagnosis:  ?Final diagnoses:  ?Paranoid schizophrenia, chronic condition (HCC)  ?Noncompliance with treatment  ? ? ?History of Present illness: [name] is a 31 y.o. male. Present to [facility], because he want somewhere to rest.  Pt is a frequent visitor to [facility]  most of the time he come here just to rest.  Pt has a history of Schizophrenia,  paranoia , and noncompliance with treatment.  ? ? ?Observation of patient , he is alert and oriented,  speech clear,  mood relax,  affect congruent with mood.  Pt denies SI,  HI,  AVh or paranoia.  Pt only answer other question with "I don't known man".  Pt does not appear to be in any distress or crisis.  NP asked patient where is home patient state I don't have one.  ? ?Recommend discharge,  pt may need to be re-establish with a ACT team,  according to TTS pt was discharge by [facility] ACT team due to non compliance to treatment.  ? ?Psychiatric Specialty Exam ? ?Presentation  ?General Appearance:Casual ? ?Eye Contact:Minimal ? ?Speech:Clear and Coherent ? ?Speech Volume:Normal ? ?Handedness:Ambidextrous ? ? ?Mood and Affect  ?Mood:Anxious ? ?Affect:Appropriate ? ? ?Thought Process  ?Thought Processes:Coherent ? ?Descriptions of Associations:Circumstantial ? ?Orientation:Full (Time, Place and Person) ? ?Thought Content:WDL ? Diagnosis of Schizophrenia or Schizoaffective disorder in past: Yes ? Duration of Psychotic Symptoms: Greater than six months ? Hallucinations:None ?talk to himself ? ?Ideas of Reference:None ? ?Suicidal Thoughts:No ? ?Homicidal Thoughts:No ? ? ?Sensorium  ?Memory:Immediate Fair ? ?Judgment:Poor ? ?Insight:Fair ? ? ?Executive Functions  ?Concentration:Poor ? ?Attention Span:Fair ? ?Recall:Fair ? ?Fund of Knowledge:Fair ? ?Language:Fair ? ? ?Psychomotor Activity  ?Psychomotor Activity:Normal ? ? ?Assets   ?Assets:Desire for Improvement; Housing ? ? ?Sleep  ?Sleep:Fair ? ?Number of hours: 5 ? ? ?Nutritional Assessment (For OBS and FBC admissions only) ?Has the patient had a weight loss or gain of 10 pounds or more in the last 3 months?: No ?Has the patient had a decrease in food intake/or appetite?: No ?Does the patient have dental problems?: No ?Does the patient have eating habits or behaviors that may be indicators of an eating disorder including binging or inducing vomiting?: No ?Has the patient recently lost weight without trying?: 0 ?Has the patient been eating poorly because of a decreased appetite?: 0 ?Malnutrition Screening Tool Score: 0 ? ? ? ?Physical Exam: ?Physical Exam ?HENT:  ?   Head: Normocephalic.  ?   Nose: Nose normal.  ?Cardiovascular:  ?   Rate and Rhythm: Normal rate.  ?Pulmonary:  ?   Effort: Pulmonary effort is normal.  ?Musculoskeletal:     ?   General: Normal range of motion.  ?   Cervical back: Normal range of motion.  ?Skin: ?   General: Skin is warm.  ?Neurological:  ?   General: No focal deficit present.  ?   Mental Status: He is alert.  ?Psychiatric:     ?   Judgment: Judgment normal.  ? ?Review of Systems  ?Constitutional: Negative.   ?HENT: Negative.    ?Eyes: Negative.   ?Respiratory: Negative.    ?Cardiovascular: Negative.   ?Gastrointestinal: Negative.   ?Genitourinary: Negative.   ?Musculoskeletal: Negative.   ?Skin: Negative.   ?Neurological: Negative.   ?Endo/Heme/Allergies: Negative.   ?Psychiatric/Behavioral:  The patient is nervous/anxious.   ?Blood pressure 121/82, pulse 79,  temperature 97.8 ?F (36.6 ?C), temperature source Oral, resp. rate 18, SpO2 98 %. There is no height or weight on file to calculate BMI. ? ?Musculoskeletal: ?Strength & Muscle Tone: within normal limits ?Gait & Station: normal ?Patient leans: N/A ? ? ?[facility] MSE Discharge Disposition for Follow up and Recommendations: ?Pt may need to be re-establish with [facility],  resource will be provided to patient  to follow up with [facility] ? ? ?Sindy Guadeloupe, NP ?02/02/2022, 3:39 AM ? ?

## 2022-02-02 NOTE — ED Notes (Signed)
I provided reinforced discharge education based off of discharge instructions. Pt acknowledged and understood my education. Pt had no further questions/concerns for provider/myself.  °

## 2022-02-02 NOTE — ED Triage Notes (Signed)
Pt BIB EMS. Pt reports that he cannot see. ?

## 2022-02-02 NOTE — ED Notes (Signed)
Patient refused AVS - instructed to follow up with ACT/[facility] team ?

## 2022-02-02 NOTE — ED Notes (Signed)
Pt refused d/c paperwork. Pt provided x1 bus pass. ?

## 2022-02-02 NOTE — ED Provider Notes (Signed)
?[name] ?Provider Note ? ? ?CSN: [id number] ?Arrival date & time: 02/02/22  0548 ? ?  ? ?History ? ?Chief Complaint  ?Patient presents with  ? Loss of Vision  ? ? ?[name] is a 31 y.o. male. ? ?Patient here he states with eye issues.  Denies any trauma, pain.  History of schizophrenia and depression.  Denies any shortness of breath.  He cannot give me much history about his eyes.  Denies any issues with his eyes in the past.  He will not state if anything makes it worse or better.  He denies any homicidal suicidal ideation. ? ?The history is provided by the patient.  ? ?  ? ?Home Medications ?Prior to Admission medications   ?Medication Sig Start Date End Date Taking? Authorizing Provider  ?paliperidone (INVEGA) 6 MG 24 hr tablet Take 1 tablet (6 mg total) by mouth daily for 14 days. ?Patient not taking: No sig reported 01/02/21 02/22/21  Mare Ferrari, PA-C  ?   ? ?Allergies    ?Shellfish allergy and Bee pollen   ? ?Review of Systems   ?Review of Systems ? ?Physical Exam ?Updated Vital Signs ?BP 123/88   Pulse (!) 59   Temp 97.9 ?F (36.6 ?C) (Oral)   Resp 16   SpO2 98%  ?Physical Exam ?Vitals and nursing note reviewed.  ?Constitutional:   ?   General: He is not in acute distress. ?   Appearance: He is well-developed.  ?HENT:  ?   Head: Normocephalic and atraumatic.  ?Eyes:  ?   Extraocular Movements: Extraocular movements intact.  ?   Conjunctiva/sclera: Conjunctivae normal.  ?   Pupils: Pupils are equal, round, and reactive to light.  ?   Comments: Pupils are equal and reactive, extraocular motions are normal, he will participate in visual acuity exam but he does not appear to have any difficulty moving his eyes or pupillary reflex.  He does not have any discomfort.  ?Cardiovascular:  ?   Rate and Rhythm: Normal rate and regular rhythm.  ?   Heart sounds: No murmur heard. ?Pulmonary:  ?   Effort: Pulmonary effort is normal. No respiratory distress.  ?   Breath sounds:  Normal breath sounds.  ?Abdominal:  ?   Palpations: Abdomen is soft.  ?   Tenderness: There is no abdominal tenderness.  ?Musculoskeletal:     ?   General: No swelling.  ?   Cervical back: Neck supple.  ?Skin: ?   General: Skin is warm and dry.  ?   Capillary Refill: Capillary refill takes less than 2 seconds.  ?Neurological:  ?   General: No focal deficit present.  ?   Mental Status: He is alert.  ?Psychiatric:     ?   Mood and Affect: Mood normal.  ? ? ?ED Results / Procedures / Treatments   ?Labs ?(all labs ordered are listed, but only abnormal results are displayed) ?Labs Reviewed - No data to display ? ?EKG ?None ? ?Radiology ?No results found. ? ?Procedures ?Procedures  ? ? ?Medications Ordered in ED ?Medications - No data to display ? ?ED Course/ Medical Decision Making/ A&P ?  ?                        ?Medical Decision Making ? ?Pascual Mctavish is here with vision issue.  History of schizophrenia.  Patient was just seen at behavioral health overnight.  They did not think he  had any acute psychological issues.  They appeared to have thought that visit was mostly for homelessness and having a place to stay.  He has had multiple ED visits for the same.  Couple weeks ago he was talking about some vision issues as well but very similar story to today.  Will provide much history.  He does not appear to have any eye emergency on exam.  His pupils are equal and reactive.  His conjunctiva is normal.  He has normal extraocular movement.  Overall think that this is likely malingering as he is asymptomatic and his eyes appear to be normal.  Seems like this is a complaint that he talks about often.  He made no mention of these issues and multiple ED and psychiatric visits here the last several days.  Patient was given something to eat and discharged in ED in good condition. ? ?This chart was dictated using voice recognition software.  Despite best efforts to proofread,  errors can occur which can change the documentation  meaning.  ? ? ? ? ? ? ? ?Final Clinical Impression(s) / ED Diagnoses ?Final diagnoses:  ?Vision disturbance  ? ? ?Rx / DC Orders ?ED Discharge Orders   ? ? None  ? ?  ? ? ?  ?Virgina Norfolk, DO ?02/02/22 6283 ? ?

## 2022-02-02 NOTE — Discharge Instructions (Signed)
Pt to follow up with [facility] to get re-establish for care ?

## 2022-02-02 NOTE — ED Notes (Signed)
I attempted to have pt perform visual acuity screening. Pt repeatedly stated "I don't know, it's fine." Pt did not complete screening. ?

## 2022-02-02 NOTE — ED Triage Notes (Signed)
Pt BIB EMS. Pt states that he has been unable to see for a few days.  ?

## 2022-02-02 NOTE — Progress Notes (Signed)
?   02/02/22 0321  ?[place] Triage Screening (Walk-ins at [facility] only)  ?How Did You Hear About [place]? Self  ?What Is the Reason for Your Visit/Call Today? [name] is a 31 year old male presenting voluntary as a walk-in to [facility]. Patient has a history of paranoid schizophrenia and frequent presentations to emergency services. Patient denied SI, HI and alcohol/drug usage. Patient denied self-harming behaviors. When asked, why are you here, patient stated "I don't know". Patient appears to be restless at times during assessment and looking around the room. When asked if he is experiencing hallucinations or has used substances, patient responds "no". Patient responds to most questions with "I don't know". Patient medical record indicates that he was discharged from services with the ACT Team. Patient denied receiving any outpatient mental health services. ACT Team information given to patient.                                                                                                                 *Per Adrian Angel, NP note on 01/20/2022: "[name] called this provider back to informed that the patient is no longer authorized for services after he refused services, an assessment and  was uncooperative on 01/18/22. She states that she is unable to pick patient up and he may be released. She states that when the patient is ready for services he can call [phone]. She states that the patient is not homeless and that he lives at [facility] in [place], [place]. Went to inform patient of plan and he had already left the lobby. *  ?How Long Has This Been Causing You Problems? > than 6 months  ?Have You Recently Had Any Thoughts About Hurting Yourself? No  ?Are You Planning to Commit Suicide/Harm Yourself At This time? No  ?Have you Recently Had Thoughts About [place]? No  ?Are You Planning To Harm Someone At This Time? No  ?Are you currently experiencing any auditory, visual or other  hallucinations? No  ?Please explain the hallucinations you are currently experiencing: none reported  ?Have You Used Any Alcohol or Drugs in the Past 24 Hours? No  ?Do you have any current medical co-morbidities that require immediate attention? No  ?What Do You Feel Would Help You the Most Today?  ?("I'm tired")  ?If access to [facility] Urgent Care was not available, would you have sought care in the Emergency Department?  ?("I don't know")  ?Determination of Need Routine (7 days)  ?Options For Referral Outpatient Therapy;Medication Management  ? ? ?

## 2022-02-02 NOTE — Discharge Instructions (Signed)
Follow up with eye doctor

## 2022-02-03 ENCOUNTER — Telehealth (HOSPITAL_COMMUNITY): Payer: Self-pay

## 2022-02-03 ENCOUNTER — Encounter (HOSPITAL_COMMUNITY): Payer: Self-pay | Admitting: Emergency Medicine

## 2022-02-03 ENCOUNTER — Ambulatory Visit (HOSPITAL_COMMUNITY)
Admission: RE | Admit: 2022-02-03 | Discharge: 2022-02-03 | Disposition: A | Discharge status: Home / Self Care | Payer: Medicaid Other | Attending: Psychiatry | Admitting: Psychiatry

## 2022-02-03 NOTE — BH Assessment (Signed)
Comprehensive Clinical Assessment (CCA) Note ? ?02/03/2022 ?[name] ?[id number] ? ?DISPOSITION: Completed CCA accompanied by [name], FNP who completed MSE. Pt does not meet criteria for inpatient psychiatric treatment. Recommendation is for Pt to follow up with [name] for outpatient services. ? ?The patient demonstrates the following risk factors for suicide: Chronic risk factors for suicide include: psychiatric disorder of schizophrenia . Acute risk factors for suicide include: social withdrawal/isolation. Protective factors for this patient include: positive therapeutic relationship. Considering these factors, the overall suicide risk at this point appears to be low. Patient is appropriate for outpatient follow up. ? ?Flowsheet Row OP [facility] from 02/02/2022 in [facility] Pre-admit (Canceled) from 02/22/2021 [facility]  ?C-SSRS RISK CATEGORY No Risk No Risk No Risk  ? ?  ? ?Pt is a 31 year old single male who presents unaccompanied to [facility]. He has a history of schizophrenia and a pattern of repeatedly presenting to emergency services because he wants to be in a safe place for a short period of time. Yesterday, he presented to [facility] and then presented twice to [facility] ED. Today he states he walked to [name] because he wants to rest and get out of the rain. He denies current suicidal ideation, homicidal ideation, auditory or visual hallucinations, or substance use. He says he needs to go somewhere but he is not certain where. He says, "I'm good" and that he is tired and needs to lie down. ? ?Pt is slightly disheveled, alert and oriented x4. Pt speaks in a mumbled tone, at low volume and normal pace. Motor behavior appears normal. Eye contact is minimal. Pt's mood is slightly anxious and affect is flat. Thought process is coherent and at times circumstantial. There is  no indication Pt is currently responding to internal stimuli. He is polite and cooperative. He is not requesting any mental health services and appears to be at baseline. ? ? ?Chief Complaint:  ?Chief Complaint  ?Patient presents with  ? Psychiatric Evaluation  ? ?Visit Diagnosis: F20.9 Schizophrenia ? ? ?CCA Screening, Triage and Referral (STR) ? ?Patient Reported Information ?How did you hear about [place]? Self ? ?Referral name: [facility]/self ? ?Referral phone number: No data recorded ? ?Whom do you see for routine medical problems? I don't have a doctor ? ?Practice/Facility Name: No data recorded ?Practice/Facility Phone Number: No data recorded ?Name of Contact: No data recorded ?Contact Number: No data recorded ?Contact Fax Number: No data recorded ?Prescriber Name: No data recorded ?Prescriber Address (if known): No data recorded ? ?What Is the Reason for Your Visit/Call Today? Pt has a history of schizophrenia and repeatedly presenting to emergency services because he wants to be in a safe place for a short period of time. Yesterday he presented to [name] and then presented twice to [name] ED. Today he states he walked to [name] because he wants to rest and get out of the rain. He denies current suicidal ideation, homicidal ideation, auditory or visual hallucinations, or substance use. He says he needs to go somewhere but he is not certain where. He says, "I'm good" and that he is tired and needs to lie down. ? ?How Long Has This Been Causing You Problems? > than 6 months ? ?What Do You Feel Would Help You the Most Today? -- (To lie down and rest) ? ? ?Have You Recently Been in Any Inpatient Treatment (Hospital/Detox/Crisis Center/28-Day Program)? No ? ?  Name/Location of Program/Hospital:No data recorded ?How Long Were You There? No data recorded ?When Were You Discharged? No data recorded ? ?Have You Ever Received Services From [facility] Before? Yes ? ?Who Do You See at [facility]? Pt has previous ED, [facility] and [facility] visits. ? ? ?Have You Recently Had Any Thoughts About Hurting Yourself? No ? ?Are You Planning to Commit Suicide/Harm Yourself At This time? No ? ? ?Have you Recently Had Thoughts About Hurting Someone [name]? No ? ?Explanation: No data recorded ? ?Have You Used Any Alcohol or Drugs in the Past 24 Hours? No ? ?How Long Ago Did You Use Drugs or Alcohol? No data recorded ?What Did You Use and How Much? Pt denies substance use ? ? ?Do You Currently Have a Therapist/Psychiatrist? No ? ?Name of Therapist/Psychiatrist: Per [name], NP note on 01/20/2022: "[name] called this provider back to informed that the patient is no longer authorized for services after he refused services, an assessment and was uncooperative on 01/18/22." ? ? ?Have You Been Recently Discharged From Any Office Practice or Programs? No ? ?Explanation of Discharge From Practice/Program: Discharged from [facility] yesterday. Multiple visits to emergency services. ? ? ?  ?CCA Screening Triage Referral Assessment ?Type of Contact: Face-to-Face ? ?Is this Initial or Reassessment? Initial Assessment ? ?Date Telepsych consult ordered in [facility]:  02/03/22 ? ?Time Telepsych consult ordered in [facility]:  0500 ? ? ?Patient Reported Information Reviewed? Yes ? ?Patient Left Without Being Seen? No ? ?Reason for Not Completing Assessment: No data recorded ? ?Collateral Involvement: Medical record ? ? ?Does Patient Have a Automotive engineer Guardian? No data recorded ?Name and Contact of Legal Guardian: No data recorded ?If Minor and Not Living with Parent(s), Who has Custody? NA ? ?Is CPS involved or ever been involved? Never ? ?Is APS involved or ever been involved? Never ? ? ?Patient Determined To Be At Risk for Harm To Self or Others Based on Review of Patient Reported Information or Presenting Complaint? No ? ?Method: No data recorded ?Availability of Means: No data recorded ?Intent: No data recorded ?Notification Required: No data  recorded ?Additional Information for Danger to Others Potential: No data recorded ?Additional Comments for Danger to Others Potential: No data recorded ?Are There Guns or Other Weapons in Your Home? No data recorded ?Types of Guns/Weapons: No data recorded ?Are These Weapons Safely Secured?                            No data recorded ?Who Could Verify You Are Able To Have These Secured: No data recorded ?Do You Have any Outstanding Charges, Pending Court Dates, Parole/Probation? No data recorded ?Contacted To Inform of Risk of Harm To Self or Others: -- (NA) ? ? ?Location of Assessment: [facility] ? ? ?Does Patient Present under Involuntary Commitment? No ? ?IVC Papers Initial File Date: No data recorded ? ?[place] of Residence: [place] ? ? ?Patient Currently Receiving the Following Services: ACTT ([facility] Community Treatment) ? ? ?Determination of Need: Routine (7 days) ? ? ?Options For Referral: Outpatient Therapy; Medication Management ? ? ? ? ?CCA Biopsychosocial ?Intake/Chief Complaint:  Patient stated he is "scared for his life." He further explained that he is "scared of life."  Patient also stated "I can't hardly breath." ? ?Current Symptoms/Problems: Pt is wheezing. Patient denies SI, HI, NSSH and AVH, Patient does not appear intoxicated, delusional or responding to internal stimuli. ? ? ?  Patient Reported Schizophrenia/Schizoaffective Diagnosis in Past: Yes ? ? ?Strengths: Pt asks for assistance ? ?Preferences: Not assessed. ? ?Abilities: Not assessed. ? ? ?Type of Services Patient Feels are Needed: Pt states he does not know ? ? ?Initial Clinical Notes/Concerns: None noted ? ? ?Mental Health Symptoms ?Depression:   ?Difficulty Concentrating; Fatigue ?  ?Duration of Depressive symptoms:  ?Greater than two weeks ?  ?Mania:   ?None ?  ?Anxiety:    ?Worrying; Tension; Fatigue; Difficulty concentrating ?  ?Psychosis:   ?Other negative symptoms ?  ?Duration of Psychotic symptoms:  ?Greater than  six months ?  ?Trauma:   ?None ?  ?Obsessions:   ?None ?  ?Compulsions:   ?None ?  ?Inattention:   ?None ?  ?Hyperactivity/Impulsivity:   ?None ?  ?Oppositional/Defiant Behaviors:   ?None ?  ?Emotional Irregularity:

## 2022-02-03 NOTE — ED Provider Notes (Signed)
?[facility]-EMERGENCY DEPT ?Provider Note ? ? ?CSN: [id number] ?Arrival date & time: 02/02/22  2319 ? ?  ? ?History ? ?Chief Complaint  ?Patient presents with  ? Eye Problem  ? ? ?[name] is a 31 y.o. male. ? ?The history is provided by the patient.  ?Eye Problem ?Location:  Both eyes ?Severity:  Mild ?Onset quality:  Gradual ?Duration: days. ?Timing:  Constant ?Progression:  Unchanged ?Chronicity:  New ?Context: not burn, not chemical exposure, not contact lens problem, not direct trauma, not foreign body, not using machinery, not scratch, not smoke exposure and not UV exposure   ?Relieved by:  Nothing ?Worsened by:  Nothing ?Ineffective treatments:  None tried ?Associated symptoms: no blurred vision, no crusting, no decreased vision, no discharge and no itching   ?Risk factors: no conjunctival hemorrhage   ? ?  ? ?Home Medications ?Prior to Admission medications   ?Medication Sig Start Date End Date Taking? Authorizing Provider  ?paliperidone (INVEGA) 6 MG 24 hr tablet Take 1 tablet (6 mg total) by mouth daily for 14 days. ?Patient not taking: No sig reported 01/02/21 02/22/21  Mare Ferrari, PA-C  ?   ? ?Allergies    ?Shellfish allergy and Bee pollen   ? ?Review of Systems   ?Review of Systems  ?Constitutional:  Negative for fever.  ?HENT:  Negative for facial swelling.   ?Eyes:  Negative for blurred vision, pain, discharge, itching and visual disturbance.  ?Respiratory:  Negative for shortness of breath.   ?Cardiovascular:  Negative for leg swelling.  ?Gastrointestinal:  Negative for abdominal pain.  ?Genitourinary:  Negative for difficulty urinating.  ?Neurological:  Negative for facial asymmetry.  ?All other systems reviewed and are negative. ? ?Physical Exam ?Updated Vital Signs ?BP (!) 143/95 (BP Location: Right Arm)   Pulse 66   Temp 97.8 ?F (36.6 ?C) (Oral)   Resp 18   Ht 5\' 5"  (1.651 m)   Wt 72.6 kg   SpO2 100%   BMI 26.63 kg/m?  ?Physical Exam ?Vitals and nursing note  reviewed.  ?Constitutional:   ?   General: He is not in acute distress. ?   Appearance: Normal appearance.  ?HENT:  ?   Head: Normocephalic and atraumatic.  ?   Nose: Nose normal.  ?Eyes:  ?   Extraocular Movements: Extraocular movements intact.  ?   Conjunctiva/sclera: Conjunctivae normal.  ?   Pupils: Pupils are equal, round, and reactive to light.  ?   Comments: Disk margins sharp   ?Cardiovascular:  ?   Rate and Rhythm: Normal rate and regular rhythm.  ?   Pulses: Normal pulses.  ?   Heart sounds: Normal heart sounds.  ?Pulmonary:  ?   Effort: Pulmonary effort is normal.  ?   Breath sounds: Normal breath sounds.  ?Abdominal:  ?   General: Bowel sounds are normal.  ?   Palpations: Abdomen is soft.  ?   Tenderness: There is no abdominal tenderness. There is no guarding.  ?Musculoskeletal:     ?   General: Normal range of motion.  ?   Cervical back: Normal range of motion and neck supple.  ?Skin: ?   General: Skin is warm and dry.  ?   Capillary Refill: Capillary refill takes less than 2 seconds.  ?Neurological:  ?   General: No focal deficit present.  ?   Mental Status: He is alert.  ?   Deep Tendon Reflexes: Reflexes normal.  ?Psychiatric:     ?  Mood and Affect: Mood normal.     ?   Behavior: Behavior normal.  ? ? ?ED Results / Procedures / Treatments   ?Labs ?(all labs ordered are listed, but only abnormal results are displayed) ?Labs Reviewed - No data to display ? ?EKG ?None ? ?Radiology ?No results found. ? ?Procedures ?Procedures  ? ? ?Medications Ordered in ED ?Medications - No data to display ? ?ED Course/ Medical Decision Making/ A&P ?  ?                        ?Medical Decision Making ?See this am for same but the complaint is unchanged but cannot say what the complaint is  ? ?Amount and/or Complexity of Data Reviewed ?External Data Reviewed: notes. ?   Details: previous ED notes reviewed ? ?Risk ?Risk Details: Vision is intact to confrontation and disk margins are sharp.  Stable for discharge with  close follow up ? ? ? ?Final Clinical Impression(s) / ED Diagnoses ?Final diagnoses:  ?Eye problem  ? ?Return for intractable cough, coughing up blood, fevers > 100.4 unrelieved by medication, shortness of breath, intractable vomiting, chest pain, shortness of breath, weakness, numbness, changes in speech, facial asymmetry, abdominal pain, passing out, Inability to tolerate liquids or food, cough, altered mental status or any concerns. No signs of systemic illness or infection. The patient is nontoxic-appearing on exam and vital signs are within normal limits.  ?I have reviewed the triage vital signs and the nursing notes. Pertinent labs & imaging results that were available during my care of the patient were reviewed by me and considered in my medical decision making (see chart for details). After history, exam, and medical workup I feel the patient has been appropriately medically screened and is safe for discharge home. Pertinent diagnoses were discussed with the patient. Patient was given return precautions. ?Rx / DC Orders ?ED Discharge Orders   ? ? None  ? ?  ? ? ?  ?Jaken Fregia, MD ?02/03/22 0104 ? ?

## 2022-02-03 NOTE — BH Assessment (Signed)
Care Management - [facility] Follow Up Discharges  ? ?Writer attempted to make contact with patient today and was unsuccessful.  The phone number listed in epic is not a working number ? ?Per chart review, patient will follow up with established ACTT Team services.  ? ?

## 2022-02-03 NOTE — ED Notes (Signed)
Patient was able to get into bed from chair with no assistance ?

## 2022-02-03 NOTE — H&P (Signed)
Behavioral Health Medical Screening Exam ? ?[name] is a 31 y.o. male with a history of paranoid schizophrenia who presents voluntarily to [facility] as a walk-in.  ? ?On evaluation patient is alert and oriented x 4, pleasant, and cooperative. Speech is clear and coherent. Mood is euthymic. Affect is flat. Thought process is coherent and thought content is logical. Denies auditory and visual hallucinations. No indication that patient is responding to internal stimuli. No evidence of delusional thought content. Denies suicidal ideations. Denies homicidal ideations. Denies substance abuse.   ? ?Patient is well known to [facility] health psychiatric services; he appears to be at his baseline.  Per chart review, he was recently discharged from [facility]; therefore patient does not have current ACT team.  ? ?Patient provided with meal tray and juice.  ? ?Total Time spent with patient: 15 minutes ? ?Psychiatric Specialty Exam: ? ?Presentation  ?General Appearance: Casual ? ?Eye Contact:Minimal ? ?Speech:Clear and Coherent; Normal Rate ? ?Speech Volume:Normal ? ?Handedness:Ambidextrous ? ? ?Mood and Affect  ?Mood:Euthymic ? ?Affect:Flat ? ? ?Thought Process  ?Thought Processes:Coherent ? ?Descriptions of Associations:Intact ? ?Orientation:Full (Time, Place and Person) ? ?Thought Content:Logical ? ?History of Schizophrenia/Schizoaffective disorder:Yes ? ?Duration of Psychotic Symptoms:Greater than six months ? ?Hallucinations:Hallucinations: None ? ?Ideas of Reference:None ? ?Suicidal Thoughts:Suicidal Thoughts: No ? ?Homicidal Thoughts:Homicidal Thoughts: No ? ? ?Sensorium  ?Memory:Immediate Fair; Recent Fair ? ?Judgment:Intact ? ?Insight:Shallow ? ? ?Executive Functions  ?Concentration:Fair ? ?Attention Span:Fair ? ?Recall:Fair ? ?[place] ? ?Language:Fair ? ? ?Psychomotor Activity  ?Psychomotor Activity:Psychomotor Activity: Normal ? ? ?Assets  ?Assets:Desire for Improvement; Financial  Resources/Insurance; Physical Health ? ? ?Sleep  ?Sleep:Sleep: Fair ? ? ? ?Physical Exam: ?Physical Exam ?Constitutional:   ?   General: He is not in acute distress. ?   Appearance: He is not ill-appearing, toxic-appearing or diaphoretic.  ?HENT:  ?   Right Ear: External ear normal.  ?   Left Ear: External ear normal.  ?Eyes:  ?   Pupils: Pupils are equal, round, and reactive to light.  ?Cardiovascular:  ?   Rate and Rhythm: Normal rate.  ?Pulmonary:  ?   Effort: Pulmonary effort is normal. No respiratory distress.  ?Musculoskeletal:     ?   General: Normal range of motion.  ?Neurological:  ?   Mental Status: He is alert and oriented to person, place, and time.  ?Psychiatric:     ?   Thought Content: Thought content is not paranoid or delusional. Thought content does not include homicidal or suicidal ideation.  ? ?Review of Systems  ?Constitutional:  Negative for chills, diaphoresis, fever, malaise/fatigue and weight loss.  ?Cardiovascular:  Negative for chest pain and palpitations.  ?Gastrointestinal:  Negative for diarrhea, nausea and vomiting.  ?Neurological:  Negative for dizziness and seizures.  ?Psychiatric/Behavioral:  Negative for depression, hallucinations, memory loss, substance abuse and suicidal ideas. The patient has insomnia. The patient is not nervous/anxious.   ? ?Blood pressure 140/84, pulse 72, temperature 98.2 ?F (36.8 ?C), resp. rate 18, SpO2 100 %. There is no height or weight on file to calculate BMI. ? ?Musculoskeletal: ?Strength & Muscle Tone: within normal limits ?Gait & Station: normal ?Patient leans: N/A ? ? ?Recommendations: ? ?Based on my evaluation the patient does not appear to have an emergency medical condition. ? ?Disposition: No evidence of imminent risk to self or others at present.   ?Patient does not meet criteria for psychiatric inpatient admission. ?Supportive therapy provided about ongoing stressors. ?Discussed crisis plan,  support from social network, calling 911, coming to  the Emergency Department, and calling Suicide Hotline.  ? ?Rozetta Nunnery, NP ?02/03/2022, 5:33 AM ? ?

## 2022-02-04 ENCOUNTER — Emergency Department (HOSPITAL_COMMUNITY): Payer: Medicaid - Out of State

## 2022-02-04 ENCOUNTER — Emergency Department (HOSPITAL_COMMUNITY)
Admission: EM | Admit: 2022-02-04 | Discharge: 2022-02-04 | Disposition: A | Discharge status: Home / Self Care | Payer: Medicaid - Out of State | Source: Home / Self Care | Attending: Emergency Medicine | Admitting: Emergency Medicine

## 2022-02-04 ENCOUNTER — Other Ambulatory Visit: Payer: Self-pay

## 2022-02-04 ENCOUNTER — Encounter (HOSPITAL_COMMUNITY): Payer: Self-pay | Admitting: Emergency Medicine

## 2022-02-04 ENCOUNTER — Emergency Department (HOSPITAL_COMMUNITY)
Admission: EM | Admit: 2022-02-04 | Discharge: 2022-02-04 | Disposition: A | Discharge status: Home / Self Care | Payer: Medicaid - Out of State | Attending: Emergency Medicine | Admitting: Emergency Medicine

## 2022-02-04 DIAGNOSIS — Z046 Encounter for general psychiatric examination, requested by authority: Secondary | ICD-10-CM | POA: Diagnosis not present

## 2022-02-04 DIAGNOSIS — R0600 Dyspnea, unspecified: Secondary | ICD-10-CM | POA: Insufficient documentation

## 2022-02-04 DIAGNOSIS — Z5902 Unsheltered homelessness: Secondary | ICD-10-CM | POA: Insufficient documentation

## 2022-02-04 DIAGNOSIS — Z5900 Homelessness unspecified: Secondary | ICD-10-CM | POA: Insufficient documentation

## 2022-02-04 DIAGNOSIS — Z765 Malingerer [conscious simulation]: Secondary | ICD-10-CM | POA: Insufficient documentation

## 2022-02-04 DIAGNOSIS — F2089 Other schizophrenia: Secondary | ICD-10-CM | POA: Insufficient documentation

## 2022-02-04 LAB — RAPID URINE DRUG SCREEN, HOSP PERFORMED
Amphetamines: NOT DETECTED
Barbiturates: NOT DETECTED
Benzodiazepines: NOT DETECTED
Cocaine: NOT DETECTED
Opiates: NOT DETECTED
Tetrahydrocannabinol: NOT DETECTED

## 2022-02-04 LAB — COMPREHENSIVE METABOLIC PANEL
ALT: 15 U/L (ref 0–44)
AST: 19 U/L (ref 15–41)
Albumin: 4.2 g/dL (ref 3.5–5.0)
Alkaline Phosphatase: 43 U/L (ref 38–126)
Anion gap: 9 (ref 5–15)
BUN: 12 mg/dL (ref 6–20)
CO2: 24 mmol/L (ref 22–32)
Calcium: 9.2 mg/dL (ref 8.9–10.3)
Chloride: 105 mmol/L (ref 98–111)
Creatinine, Ser: 1.05 mg/dL (ref 0.61–1.24)
GFR, Estimated: >60 mL/min (ref >60)
Glucose, Bld: 91 mg/dL (ref 70–99)
Potassium: 3.6 mmol/L (ref 3.5–5.1)
Sodium: 138 mmol/L (ref 135–145)
Total Bilirubin: 0.7 mg/dL (ref 0.3–1.2)
Total Protein: 6.7 g/dL (ref 6.5–8.1)

## 2022-02-04 LAB — CBC
HCT: 42.4 % (ref 39.0–52.0)
Hemoglobin: 15 g/dL (ref 13.0–17.0)
MCH: 31.6 pg (ref 26.0–34.0)
MCHC: 35.4 g/dL (ref 30.0–36.0)
MCV: 89.3 fL (ref 80.0–100.0)
Platelets: 244 10*3/uL (ref 150–400)
RBC: 4.75 MIL/uL (ref 4.22–5.81)
RDW: 12.5 % (ref 11.5–15.5)
WBC: 5.9 10*3/uL (ref 4.0–10.5)
nRBC: 0 % (ref 0.0–0.2)

## 2022-02-04 LAB — ACETAMINOPHEN LEVEL: Acetaminophen (Tylenol), Serum: <10 ug/mL — ABNORMAL LOW (ref 10–30)

## 2022-02-04 LAB — SALICYLATE LEVEL: Salicylate Lvl: <7 mg/dL — ABNORMAL LOW (ref 7.0–30.0)

## 2022-02-04 LAB — ETHANOL: Alcohol, Ethyl (B): <10 mg/dL (ref <10)

## 2022-02-04 MED ORDER — ALBUTEROL SULFATE HFA 108 (90 BASE) MCG/ACT IN AERS
2.0000 | INHALATION_SPRAY | RESPIRATORY_TRACT | Status: DC | PRN
  Administered 2022-02-04: 2 via RESPIRATORY_TRACT
  Filled 2022-02-04: qty 6.7

## 2022-02-04 MED ORDER — ALBUTEROL SULFATE (2.5 MG/3ML) 0.083% IN NEBU
2.5000 mg | INHALATION_SOLUTION | Freq: Once | RESPIRATORY_TRACT | Status: AC
  Administered 2022-02-04: 2.5 mg via RESPIRATORY_TRACT
  Filled 2022-02-04: qty 3

## 2022-02-04 NOTE — Progress Notes (Addendum)
CSW completed triage. ? ? ?Benjaman Kindler, MSW, LCSWA ?02/04/2022 11:27 PM ? ?  ?

## 2022-02-04 NOTE — ED Provider Notes (Signed)
?[name] EMERGENCY DEPARTMENT ?Provider Note ? ? ?CSN: [id number] ?Arrival date & time: 02/04/22  4098 ? ?  ? ?History ? ?Chief Complaint  ?Patient presents with  ? Shortness of Breath  ? ? ?[name] is a 31 y.o. male. ? ?Pt is a 31 yo male with a pmhx significant for schizophrenia, polysubstance abuse, homelessness, and malingering.  He was just seen a few hours ago and checked back in saying he was sob.  Pt is unable to describe how he is sob.  He does not know how long he's been sob.  He is unable to say if he's had a cough.  He is unable to describe anything about his sob.  He says "I don't know" to every question I ask.   ? ? ?  ? ?Home Medications ?Prior to Admission medications   ?Medication Sig Start Date End Date Taking? Authorizing Provider  ?paliperidone (INVEGA) 6 MG 24 hr tablet Take 1 tablet (6 mg total) by mouth daily for 14 days. ?Patient not taking: No sig reported 01/02/21 02/22/21  Mare Ferrari, PA-C  ?   ? ?Allergies    ?Shellfish allergy and Bee pollen   ? ?Review of Systems   ?Review of Systems  ?Respiratory:  Positive for shortness of breath.   ?All other systems reviewed and are negative. ? ?Physical Exam ?Updated Vital Signs ?BP (!) 146/94 (BP Location: Right Arm)   Pulse 97   Temp 97.9 ?F (36.6 ?C) (Oral)   Resp (!) 25   SpO2 98%  ?Physical Exam ?Vitals and nursing note reviewed.  ?Constitutional:   ?   Appearance: He is well-developed.  ?HENT:  ?   Head: Normocephalic and atraumatic.  ?   Mouth/Throat:  ?   Mouth: Mucous membranes are moist.  ?   Pharynx: Oropharynx is clear.  ?Eyes:  ?   Extraocular Movements: Extraocular movements intact.  ?   Pupils: Pupils are equal, round, and reactive to light.  ?Cardiovascular:  ?   Rate and Rhythm: Normal rate and regular rhythm.  ?Pulmonary:  ?   Effort: Pulmonary effort is normal.  ?   Breath sounds: Normal breath sounds.  ?Abdominal:  ?   General: Bowel sounds are normal.  ?   Palpations: Abdomen is soft.   ?Musculoskeletal:     ?   General: Normal range of motion.  ?   Cervical back: Normal range of motion and neck supple.  ?Skin: ?   General: Skin is warm.  ?   Capillary Refill: Capillary refill takes less than 2 seconds.  ?Neurological:  ?   General: No focal deficit present.  ?   Mental Status: He is alert.  ?Psychiatric:     ?   Mood and Affect: Affect is flat.     ?   Speech: He is noncommunicative.  ? ? ?ED Results / Procedures / Treatments   ?Labs ?(all labs ordered are listed, but only abnormal results are displayed) ?Labs Reviewed - No data to display ? ?EKG ?None ? ?Radiology ?DG Chest 2 View ? ?Result Date: 02/04/2022 ?CLINICAL DATA:  Shortness of breath. EXAM: CHEST - 2 VIEW COMPARISON:  01/29/2022 FINDINGS: The heart size and mediastinal contours are within normal limits. Both lungs are clear. The visualized skeletal structures are unremarkable. IMPRESSION: No active cardiopulmonary disease. Electronically Signed   By: Kennith Center M.D.   On: 02/04/2022 08:51   ? ?Procedures ?Procedures  ? ? ?Medications Ordered in ED ?Medications  ?  albuterol (VENTOLIN HFA) 108 (90 Base) MCG/ACT inhaler 2 puff (2 puffs Inhalation Given 02/04/22 0643)  ?albuterol (PROVENTIL) (2.5 MG/3ML) 0.083% nebulizer solution 2.5 mg (2.5 mg Nebulization Given 02/04/22 0642)  ? ? ?ED Course/ Medical Decision Making/ A&P ?  ?                        ?Medical Decision Making ?Amount and/or Complexity of Data Reviewed ?Radiology: ordered. ? ?Risk ?Prescription drug management. ? ? ?This patient presents to the ED for concern of sob, this involves an extensive number of treatment options, and is a complaint that carries with it a high risk of complications and morbidity.  The differential diagnosis includes pna, lab abn, malingering ? ? ?Co morbidities that complicate the patient evaluation ? ?Schizophrenia, polysubstance abuse ? ? ?Additional history obtained: ? ?Additional history obtained from epic chart review ? ? ? ?Lab Tests: ? ?I  personally interpreted labs done earlier today.  The pertinent results include:  cbc, cmp, uds neg.  Tylenol and asa levels neg ? ? ?Imaging Studies ordered: ? ?I ordered imaging studies including cxr  ?I independently visualized and interpreted imaging which showed  ?  ?IMPRESSION:  ?No active cardiopulmonary disease.  ? ?I agree with the radiologist interpretation ? ? ?Cardiac Monitoring: ? ?The patient was maintained on a cardiac monitor.  I personally viewed and interpreted the cardiac monitored which showed an underlying rhythm of: nsr ? ? ? ? ?Problem List / ED Course: ? ?Sob:  vitals are nl.  CXR nl.  Pt does not have anything acute going on.  He is not suicidal or homicidal.  He is stable for d/c home ? ? ? ?Social Determinants of Health: ? ?homeless ? ? ?Dispostion: ? ?After consideration of the diagnostic results and the patients response to treatment, I feel that the patent would benefit from discharge to shelter.   ? ? ? ? ? ? ? ?Final Clinical Impression(s) / ED Diagnoses ?Final diagnoses:  ?Homeless  ?Dyspnea, unspecified type  ? ? ?Rx / DC Orders ?ED Discharge Orders   ? ? None  ? ?  ? ? ?  ?Jacalyn Lefevre, MD ?02/04/22 [phone] ? ?

## 2022-02-04 NOTE — Progress Notes (Signed)
?   02/04/22 2242  ?[facility] Triage Screening (Walk-ins at [facility] only)  ?How Did You Hear About [place]? Family/Friend  ?What Is the Reason for Your Visit/Call Today? Pt reports that he is unaccompanied to [facility]. PT keeps reporting ?I don?t know.? Pt would not tell CSW his name at first but later shared his name and before after CSW explained that CSW was trying to assist him with care this evening. CSW explained the Waiver of Medical Screening Exam. Pt signed MSE.  Pt reports that he does not know what the day is but is aware of the month. Pt states ?I have no idea why I came here tonight.? Pt denies SI and HI. Pt states that he is not the type of person to harm himself. Pt denies AVH and paranoia. Pt denies substance abuse. Pt states he does not know why he is here yet again.  CSW explains that the facility is an urgent care and pt would need to have an emergent needed for care. Pt then states ?I don?t know.? Pt reports that he is homeless. When CSW asked how we could help. Pt states "I don't know." Pt then states "I am young and my mother is gone and I don't know what to do." Pt states that he does not know his fahter. Pt states that he does not talk to his grandparents or have friends. Pt then shares I just don't know what to do.  ?How Long Has This Been Causing You Problems? <Week  ?Have You Recently Had Any Thoughts About Hurting Yourself? No  ?Are You Planning to Commit Suicide/Harm Yourself At This time? No  ?Have you Recently Had Thoughts About Hurting Someone [name]? No  ?Are You Planning To Harm Someone At This Time? No  ?Are you currently experiencing any auditory, visual or other hallucinations? No  ?Have You Used Any Alcohol or Drugs in the Past 24 Hours? No  ?How long ago did you use Drugs or Alcohol? Pt denies histroy of drugs or alcohol.  ?What Did You Use and How Much? Pt denies uses substance.  ?Clinician description of patient physical appearance/behavior: Pt is dressed age appropriate. Pt does not appear  alert and will not make eye contact with CSW. Pt denies having a support system.  ?What Do You Feel Would Help You the Most Today? Housing Assistance;Social Support;Food Assistance  ?If access to [facility] was not available, would you have sought care in the Emergency Department? No  ?Determination of Need Routine (7 days)  ?Options For Referral Mobile Crisis;Other: Comment ?(Homeless resources)  ? ?Maryjean Ka, MSW, LCSWA ?02/04/2022 11:27 PM ? ? ?

## 2022-02-04 NOTE — ED Notes (Addendum)
Pt noted to suddenly get out of recliner and pace around room. Pt states he cannot breathe and he needs air. Refuses to sit still long enough for staff to obtain vital signs. Standing orders for ShOB placed. Pt refuses to use inhaler, states someone gave him some meth shots earlier. Pt continually paces in room and states he cannot see. Some wheezing noted throughout by this RN. CN notified of the same and bed assigned in main ED. ?

## 2022-02-04 NOTE — ED Notes (Signed)
Patient refused AVS, and assessments, patient walked out of ED. ?

## 2022-02-04 NOTE — ED Notes (Signed)
[name] to patient bedside woke him up to ask what complaint he had unable to tell me why he was in the ED, states he doesn't know what is wrong with him ?

## 2022-02-04 NOTE — ED Triage Notes (Signed)
Pt reported to ED with c/o shortness of breath. Pt noted to be wheezing and breathing heavily, states "I cannot tal" when this RN attempted to ask triage questions.  ?

## 2022-02-04 NOTE — Discharge Instructions (Signed)
Follow-up with outpatient resources as needed. ?

## 2022-02-04 NOTE — ED Triage Notes (Signed)
Pt reported to ED stating that cannot see, however pt s able to text on cell phone. Pt states he does not know why he is here. Denies any S/HI but stated he wants a psych evaluation.  ?

## 2022-02-04 NOTE — ED Provider Notes (Signed)
Behavioral Health Urgent Care Medical Screening Exam ? ?Patient Name: [name] ?MRN: [id number] ?Date of Evaluation: 02/05/22 ?Chief Complaint:  I dont know why I am here ?Diagnosis:  ?Final diagnoses:  ?Other schizophrenia ([place])  ?Homelessness  ? ? ?History of Present illness: [name] is a 31 y.o. male  with a history of paranoid schizophrenia presenting voluntarily and unaccompanied to [facility]. Patient is known to this provider and has had similar presentations to this facility in the past. most recently on 02/03/22. When patient was asked why he is here, patient reports that he does not know. Patient states that he is sleepy and hungry. Patient states that he is homeless and that he has been staying on the street. Patient reports he no longer has an ACT team and that he no longer takes an injection because he does not need it. Per TTS Counselor Rico Sheehan patent refused ACT services from [facility] and was uncooperative with [employer] during their assessment.  ? ?Patient appears disheveled and unkempt during the assessment and answers most question with "I don't know". Patient denies any SI/HI or AVH, denies any substance use and does not appear to be responding to any internal or external stimuli at this time. Patient does not meet criteria for inpatient treatment at this time and will be discharged with resources.  ? ?[name] denies any SI/Hi/AVH  ? ?Psychiatric Specialty Exam ? ?Presentation  ?General Appearance:Casual ? ?Eye Contact:Good ? ?Speech:Clear and Coherent ? ?Speech Volume:Normal ? ?Handedness:Right ? ? ?Mood and Affect  ?Mood:Depressed ? ?Affect:Appropriate ? ? ?Thought Process  ?Thought Processes:Coherent ? ?Descriptions of Associations:Intact ? ?Orientation:Full (Time, Place and Person) ? ?Thought Content:WDL ? Diagnosis of Schizophrenia or Schizoaffective disorder in past: Yes ? Duration of Psychotic Symptoms: Greater than six months ? Hallucinations:None ?denies AVH ? ?Ideas of  Reference:None ? ?Suicidal Thoughts:No ? ?Homicidal Thoughts:No ? ? ?Sensorium  ?Memory:Immediate Poor; Recent Poor; Remote Poor ? ?Judgment:Poor ? ?Insight:Poor ? ? ?Executive Functions  ?Concentration:Poor ? ?Attention Span:Poor ? ?Recall:Poor ? ?Fund of Knowledge:Poor ? ?Language:Poor ? ? ?Psychomotor Activity  ?Psychomotor Activity:Normal ? ? ?Assets  ?Assets:Financial Resources/Insurance; Social Support; Physical Health ? ? ?Sleep  ?Sleep:Fair ? ?Number of hours: -1 ? ? ?No data recorded ? ?Physical Exam: ?Physical Exam ?Constitutional:   ?   Appearance: Normal appearance.  ?HENT:  ?   Head: Normocephalic and atraumatic.  ?   Nose: Nose normal.  ?Eyes:  ?   Pupils: Pupils are equal, round, and reactive to light.  ?Cardiovascular:  ?   Rate and Rhythm: Normal rate.  ?Pulmonary:  ?   Effort: Pulmonary effort is normal.  ?Abdominal:  ?   General: Abdomen is flat.  ?   Palpations: Abdomen is soft.  ?Musculoskeletal:     ?   General: Normal range of motion.  ?   Cervical back: Normal range of motion.  ?Skin: ?   General: Skin is warm.  ?Neurological:  ?   Mental Status: He is alert and oriented to person, place, and time.  ?Psychiatric:     ?   Mood and Affect: Mood normal.  ? ?Review of Systems  ?Constitutional: Negative.   ?HENT: Negative.    ?Eyes: Negative.   ?Respiratory: Negative.    ?Cardiovascular: Negative.   ?Gastrointestinal: Negative.   ?Genitourinary: Negative.   ?Musculoskeletal: Negative.   ?Skin: Negative.   ?Neurological: Negative.   ?Endo/Heme/Allergies: Negative.   ?Psychiatric/Behavioral:  Positive for depression. Negative for hallucinations, substance abuse and suicidal ideas. The  patient is nervous/anxious.   ?Blood pressure (!) 139/92, pulse 90, temperature 97.6 ?F (36.4 ?C), temperature source Oral, resp. rate 18, SpO2 95 %. There is no height or weight on file to calculate BMI. ? ?Musculoskeletal: ?Strength & Muscle Tone: within normal limits ?Gait & Station: normal ?Patient leans:  N/A ? ? ?[facility] MSE Discharge Disposition for Follow up and Recommendations: ?Based on my evaluation the patient does not appear to have an emergency medical condition and can be discharged with resources and follow up care in outpatient services for Medication Management and Individual Therapy ? ? ?Lucia Bitter, NP ?02/05/2022, 12:05 AM ? ?

## 2022-02-04 NOTE — ED Provider Notes (Signed)
?[name] EMERGENCY DEPARTMENT ?Provider Note ? ? ?CSN: [id number] ?Arrival date & time: 02/04/22  0001 ? ?  ? ?History ? ?Chief Complaint  ?Patient presents with  ? Psychiatric Evaluation  ? ? ?[name] is a 31 y.o. male. ? ?Patient is a 31 year old male with history of paranoid schizophrenia, polysubstance abuse, homelessness, and malingering.  Patient presenting for unknown reasons.  When patient asked why he is here, he tells me "I do not know".  I asked him how he got here, he tells me "I do not know".  When asking the patient if there is something bothering him, he replies "no".  He denies any suicidal or homicidal ideation.  Patient has history of frequent presentations to the ER, many times for food and shelter. ? ?The history is provided by the patient.  ? ?  ? ?Home Medications ?Prior to Admission medications   ?Medication Sig Start Date End Date Taking? Authorizing Provider  ?paliperidone (INVEGA) 6 MG 24 hr tablet Take 1 tablet (6 mg total) by mouth daily for 14 days. ?Patient not taking: No sig reported 01/02/21 02/22/21  Mare Ferrari, PA-C  ?   ? ?Allergies    ?Shellfish allergy and Bee pollen   ? ?Review of Systems   ?Review of Systems  ?All other systems reviewed and are negative. ? ?Physical Exam ?Updated Vital Signs ?BP 136/90 (BP Location: Right Arm)   Pulse 78   Temp 98 ?F (36.7 ?C)   Resp 17   SpO2 95%  ?Physical Exam ?Vitals and nursing note reviewed.  ?Constitutional:   ?   General: He is not in acute distress. ?   Appearance: He is well-developed. He is not diaphoretic.  ?HENT:  ?   Head: Normocephalic and atraumatic.  ?Cardiovascular:  ?   Rate and Rhythm: Normal rate and regular rhythm.  ?   Heart sounds: No murmur heard. ?  No friction rub.  ?Pulmonary:  ?   Effort: Pulmonary effort is normal. No respiratory distress.  ?   Breath sounds: Normal breath sounds. No wheezing or rales.  ?Abdominal:  ?   General: Bowel sounds are normal. There is no distension.  ?    Palpations: Abdomen is soft.  ?   Tenderness: There is no abdominal tenderness.  ?Musculoskeletal:     ?   General: Normal range of motion.  ?   Cervical back: Normal range of motion and neck supple.  ?Skin: ?   General: Skin is warm and dry.  ?Neurological:  ?   Mental Status: He is alert and oriented to person, place, and time.  ?   Coordination: Coordination normal.  ? ? ?ED Results / Procedures / Treatments   ?Labs ?(all labs ordered are listed, but only abnormal results are displayed) ?Labs Reviewed  ?SALICYLATE LEVEL - Abnormal; Notable for the following components:  ?    Result Value  ? Salicylate Lvl <7.0 (*)   ? All other components within normal limits  ?ACETAMINOPHEN LEVEL - Abnormal; Notable for the following components:  ? Acetaminophen (Tylenol), Serum <10 (*)   ? All other components within normal limits  ?COMPREHENSIVE METABOLIC PANEL  ?ETHANOL  ?CBC  ?RAPID URINE DRUG SCREEN, HOSP PERFORMED  ? ? ?EKG ?None ? ?Radiology ?No results found. ? ?Procedures ?Procedures  ? ? ?Medications Ordered in ED ?Medications  ?albuterol (VENTOLIN HFA) 108 (90 Base) MCG/ACT inhaler 2 puff (2 puffs Inhalation Given 02/04/22 0218)  ? ? ?ED Course/ Medical Decision  Making/ A&P ? ?Patient with psychiatric history as per HPI presenting here without specific complaint.  I have interacted with this patient in the past on multiple occasions and he seems consistent with his baseline.  I suspect he is here for food/shelter as patient is homeless.  He has no complaints and I feel can safely be discharged. ? ?Final Clinical Impression(s) / ED Diagnoses ?Final diagnoses:  ?None  ? ? ?Rx / DC Orders ?ED Discharge Orders   ? ? None  ? ?  ? ? ?  ?Geoffery Lyons, MD ?02/04/22 [phone] ? ?

## 2022-02-05 ENCOUNTER — Encounter (HOSPITAL_COMMUNITY): Payer: Self-pay

## 2022-02-05 ENCOUNTER — Emergency Department (HOSPITAL_COMMUNITY)
Admission: EM | Admit: 2022-02-05 | Discharge: 2022-02-05 | Disposition: A | Discharge status: Home / Self Care | Payer: Medicaid - Out of State | Attending: Emergency Medicine | Admitting: Emergency Medicine

## 2022-02-05 ENCOUNTER — Other Ambulatory Visit: Payer: Self-pay

## 2022-02-05 ENCOUNTER — Ambulatory Visit (HOSPITAL_COMMUNITY)
Admission: EM | Admit: 2022-02-05 | Discharge: 2022-02-05 | Disposition: A | Discharge status: Home / Self Care | Payer: Medicaid Other | Attending: Psychiatry | Admitting: Psychiatry

## 2022-02-05 DIAGNOSIS — Z5900 Homelessness unspecified: Secondary | ICD-10-CM

## 2022-02-05 DIAGNOSIS — R062 Wheezing: Secondary | ICD-10-CM | POA: Diagnosis present

## 2022-02-05 DIAGNOSIS — F2089 Other schizophrenia: Secondary | ICD-10-CM

## 2022-02-05 NOTE — ED Triage Notes (Signed)
Patient BIB [facility]. Was seen last night for the same thing (wheezing). Refused solumedrol with EMS.  ? ?EMS ?10 albuterol ?0.5 atrovent ?(Patients wheezing improved) ?

## 2022-02-05 NOTE — ED Notes (Signed)
Pt discharged in no acute distress. Denied SI/HI/AVH. A&O x4, ambulatory. Pt refused for RN to review AVS instructions, stating he doesn't want to go back in the cold and needs to be provided with a coat. No belongings in locker. Pt escorted off property by security. Safety maintained.  ?

## 2022-02-05 NOTE — Discharge Instructions (Addendum)

## 2022-02-05 NOTE — ED Notes (Signed)
Pt stated he did not want his d/c paperwork, refused d/v vital signs. Pt wanted a bus pass.  ?

## 2022-02-05 NOTE — ED Provider Notes (Signed)
[facility]Provider Note ? ? ?CSN: [id number] ?Arrival date & time: 02/05/22  0441 ? ?  ? ?History ? ?Chief Complaint  ?Patient presents with  ? Wheezing  ? ? ?[name] is a 31 y.o. male.  With past medical history of polysubstance use, paranoid schizophrenia, malingering who presents emergency department for wheezing. ? ?Patient provides minimal answers to my questions.  He states that he was having a problem with wheezing but this is since resolved.  He has no complaints at this time.  He denies any chest pain, cough, fevers, palpitations, shortness of breath.  Per EMS was given a breathing treatment with improved symptoms.  He refused Solu-Medrol with EMS. ? ? ?Wheezing ? ?  ? ?Home Medications ?Prior to Admission medications   ?Medication Sig Start Date End Date Taking? Authorizing Provider  ?paliperidone (INVEGA) 6 MG 24 hr tablet Take 1 tablet (6 mg total) by mouth daily for 14 days. ?Patient not taking: No sig reported 01/02/21 02/22/21  Mare Ferrari, PA-C  ?   ? ?Allergies    ?Shellfish allergy and Bee pollen   ? ?Review of Systems   ?Review of Systems  ?Respiratory:  Positive for wheezing.   ?All other systems reviewed and are negative. ? ?Physical Exam ?Updated Vital Signs ?BP 139/90   Pulse 80   Temp 97.7 ?F (36.5 ?C) (Oral)   Resp 16   SpO2 100%  ?Physical Exam ?Vitals and nursing note reviewed.  ?Constitutional:   ?   General: He is not in acute distress. ?   Appearance: Normal appearance. He is normal weight. He is not ill-appearing or toxic-appearing.  ?HENT:  ?   Head: Normocephalic and atraumatic.  ?   Nose: Nose normal.  ?   Mouth/Throat:  ?   Mouth: Mucous membranes are moist.  ?   Pharynx: Oropharynx is clear.  ?Eyes:  ?   General: No scleral icterus. ?   Extraocular Movements: Extraocular movements intact.  ?Cardiovascular:  ?   Rate and Rhythm: Normal rate and regular rhythm.  ?   Pulses: Normal pulses.  ?   Heart sounds: No murmur heard. ?Pulmonary:   ?   Effort: Pulmonary effort is normal. No respiratory distress.  ?   Breath sounds: Normal breath sounds. No wheezing, rhonchi or rales.  ?Abdominal:  ?   General: Bowel sounds are normal.  ?   Palpations: Abdomen is soft.  ?Musculoskeletal:     ?   General: Normal range of motion.  ?   Cervical back: Neck supple.  ?Skin: ?   General: Skin is warm and dry.  ?   Capillary Refill: Capillary refill takes less than 2 seconds.  ?Neurological:  ?   General: No focal deficit present.  ?   Mental Status: He is alert and oriented to person, place, and time. Mental status is at baseline.  ?Psychiatric:     ?   Mood and Affect: Mood normal.     ?   Behavior: Behavior normal.     ?   Thought Content: Thought content normal.     ?   Judgment: Judgment normal.  ? ? ?ED Results / Procedures / Treatments   ?Labs ?(all labs ordered are listed, but only abnormal results are displayed) ?Labs Reviewed - No data to display ? ?EKG ?None ? ?Radiology ?DG Chest 2 View ? ?Result Date: 02/04/2022 ?CLINICAL DATA:  Shortness of breath. EXAM: CHEST - 2 VIEW COMPARISON:  01/29/2022 FINDINGS:  The heart size and mediastinal contours are within normal limits. Both lungs are clear. The visualized skeletal structures are unremarkable. IMPRESSION: No active cardiopulmonary disease. Electronically Signed   By: Kennith Center M.D.   On: 02/04/2022 08:51   ? ?Procedures ?Procedures  ? ?Medications Ordered in ED ?Medications - No data to display ? ?ED Course/ Medical Decision Making/ A&P ?  ?                        ?Medical Decision Making ?This patient presents to the ED for concern of wheezing, this involves an extensive number of treatment options, and is a complaint that carries with it a high risk of complications and morbidity.  The differential diagnosis includes asthma exacerbation, COPD exacerbation, congestive heart failure, etc. ? ?Co morbidities that complicate the patient evaluation ?Schizophrenia ? ?Additional history obtained:  ?Additional  history obtained from: None ?External records from outside source obtained and reviewed including: Previous ED visit provider notes ? ?EKG: ?Not indicated ? ?Cardiac Monitoring: ?Not indicated ? ?Lab Results: ?Not indicated ? ? ?Imaging Studies ordered:  ?Not indicated ?Medications  ?I ordered medication including n/a for n/a ?Reevaluation of the patient after medication shows that patient  n/a ?-I reviewed the patient's home medications and did not make adjustments. ?-I did not prescribe new home medications. ? ?Tests Considered: ?CXR - lungs clear, no fevers or SOB ? ?Critical Interventions: ?Received breathing tx from EMS with resolution of symptoms  ? ?Consultations: ?None indicated  ? ?SDH ?Homelessness, mental health  ? ?ED Course: ?31 year old male who presents to the emergency department with wheezing. ? ?He answers minimal questions. States he had wheezing earlier but not currently. He has no other associated symptoms. Not wheezing on my exam or in respiratory distress. He is asking if he can leave.  ? ?He declines script for albuterol inhaler. Given return precautions. He is overall well appear in NAD. VSS. No indication for CXR or workup at this time. ? ?After consideration of the diagnostic results and the patients response to treatment, I feel that the patent would benefit from discharge. ?The patient has been appropriately medically screened and/or stabilized in the ED. I have low suspicion for any other emergent medical condition which would require further screening, evaluation or treatment in the ED or require inpatient management. The patient is overall well appearing and non-toxic in appearance. They are hemodynamically stable at time of discharge.   ?Final Clinical Impression(s) / ED Diagnoses ?Final diagnoses:  ?Wheezing  ? ? ?Rx / DC Orders ?ED Discharge Orders   ? ? None  ? ?  ? ? ?  ?Cristopher Peru, PA-C ?02/05/22 [phone] ? ?  ?Sloan Leiter, DO ?02/05/22 1917 ? ?

## 2022-02-05 NOTE — Discharge Instructions (Addendum)
You are seen in the emergency department today for wheezing.  Please return if you have any concerns. ?

## 2022-02-05 NOTE — ED Notes (Signed)
Pt airway patent, breathing without difficulty. No wheezing noted. Pt requested a warm blanket and food. ?

## 2022-02-18 ENCOUNTER — Emergency Department (HOSPITAL_COMMUNITY)
Admission: EM | Admit: 2022-02-18 | Discharge: 2022-02-18 | Disposition: A | Discharge status: Home / Self Care | Payer: Medicaid - Out of State | Attending: Emergency Medicine | Admitting: Emergency Medicine

## 2022-02-18 ENCOUNTER — Encounter (HOSPITAL_COMMUNITY): Payer: Self-pay

## 2022-02-18 ENCOUNTER — Other Ambulatory Visit: Payer: Self-pay

## 2022-02-18 DIAGNOSIS — F419 Anxiety disorder, unspecified: Secondary | ICD-10-CM | POA: Insufficient documentation

## 2022-02-18 DIAGNOSIS — F2089 Other schizophrenia: Secondary | ICD-10-CM | POA: Insufficient documentation

## 2022-02-18 DIAGNOSIS — Z5900 Homelessness unspecified: Secondary | ICD-10-CM | POA: Insufficient documentation

## 2022-02-18 DIAGNOSIS — R062 Wheezing: Secondary | ICD-10-CM | POA: Insufficient documentation

## 2022-02-18 MED ORDER — HYDROXYZINE HCL 25 MG PO TABS: 25.0000 mg | ORAL_TABLET | Freq: Once | ORAL | Status: DC | PRN

## 2022-02-18 MED ORDER — HYDROXYZINE HCL 25 MG PO TABS: 25.0000 mg | ORAL_TABLET | Freq: Once | ORAL | Status: DC

## 2022-02-18 NOTE — ED Triage Notes (Signed)
Pt presents to [facility] voluntarily, unaccompanied. Pt was asked what brought him to [facility] tonight? Pt responds repeatedly " I dont know". Pt reports being hot and needed to sit down. Pt was not forthcoming with information during triage process, but did deny SI, HI, AVH and substance/alcohol use. ?

## 2022-02-18 NOTE — Discharge Instructions (Signed)
I recommend seeing a psychiatrist/therapist.  I do need the information for the behavioral health urgent care as well as some resources for outpatient therapy. ?

## 2022-02-18 NOTE — ED Provider Notes (Signed)
?[facility] DEPT ?Provider Note ? ? ?CSN: [id number] ?Arrival date & time: 02/18/22  0548 ? ?  ? ?History ? ?Chief Complaint  ?Patient presents with  ? Mental Health Problem  ? ? ?[name] is a 31 y.o. male. ? ? ?Mental Health Problem ?Patient is a 31 year old male with past medical history significant for substance-induced mood disorder, schizophrenia, alcohol abuse, malingering, homelessness ? ?Presented emergency room today with complaint of "I need to chill pill " ? ?He denies SI, HI, AVH. ? ?He has no physical symptoms of chest pain, abdominal pain, lightheadedness dizziness fever cough congestion lightheaded nausea vomiting or diarrhea. ? ? ?  ? ?Home Medications ?Prior to Admission medications   ?Medication Sig Start Date End Date Taking? Authorizing Provider  ?paliperidone (INVEGA) 6 MG 24 hr tablet Take 1 tablet (6 mg total) by mouth daily for 14 days. ?Patient not taking: No sig reported 01/02/21 02/22/21  Garald Balding, PA-C  ?   ? ?Allergies    ?Shellfish allergy and Bee pollen   ? ?Review of Systems   ?Review of Systems ? ?Physical Exam ?Updated Vital Signs ?BP 118/80 (BP Location: Left Arm)   Pulse 65   Temp 98.1 ?F (36.7 ?C) (Oral)   Resp 16   SpO2 99%  ?Physical Exam ?Vitals and nursing note reviewed.  ?Constitutional:   ?   General: He is not in acute distress. ?   Appearance: Normal appearance. He is not ill-appearing.  ?HENT:  ?   Head: Normocephalic and atraumatic.  ?Eyes:  ?   General: No scleral icterus.    ?   Right eye: No discharge.     ?   Left eye: No discharge.  ?   Conjunctiva/sclera: Conjunctivae normal.  ?Pulmonary:  ?   Effort: Pulmonary effort is normal.  ?   Breath sounds: No stridor.  ?Neurological:  ?   Mental Status: He is alert and oriented to person, place, and time. Mental status is at baseline.  ?Psychiatric:  ?   Comments: Euthymic  ? ? ?ED Results / Procedures / Treatments   ?Labs ?(all labs ordered are listed, but only abnormal results  are displayed) ?Labs Reviewed - No data to display ? ?EKG ?None ? ?Radiology ?No results found. ? ?Procedures ?Procedures  ? ? ?Medications Ordered in ED ?Medications  ?hydrOXYzine (ATARAX) tablet 25 mg (has no administration in time range)  ? ? ?ED Course/ Medical Decision Making/ A&P ?  ?                        ?Medical Decision Making ?Risk ?Prescription drug management. ? ? ?Patient is a 31 year old male with past medical history significant for substance-induced mood disorder, schizophrenia, alcohol abuse, malingering, homelessness ? ?Presented emergency room today with complaint of "I need to chill pill " ? ?He denies SI, HI, AVH. ? ?He has no physical symptoms of chest pain, abdominal pain, lightheadedness dizziness fever cough congestion lightheaded nausea vomiting or diarrhea. ? ? ?Physical exam unremarkable. ? ? ?Offered patient 1 dose of hydroxyzine we will place this as a as needed order for patient and nurse to decide at time of discharge.  There is no medical emergency present and I will recommend that he follow-up outpatient with [facility] or other outpatient psychiatry/therapist ? ?Return precautions discussed ? ?Final Clinical Impression(s) / ED Diagnoses ?Final diagnoses:  ?Anxiety  ? ? ?Rx / DC Orders ?ED Discharge Orders   ? ?  None  ? ?  ? ? ?  ?Pati Gallo [place], [place] ?02/18/22 0848 ? ?  ?Fredia Sorrow, MD ?02/19/22 0818 ? ?

## 2022-02-18 NOTE — ED Triage Notes (Signed)
Pt states that he needs a chill pill.  ?

## 2022-02-19 ENCOUNTER — Encounter (HOSPITAL_COMMUNITY): Payer: Self-pay

## 2022-02-19 ENCOUNTER — Other Ambulatory Visit: Payer: Self-pay

## 2022-02-19 ENCOUNTER — Emergency Department (HOSPITAL_COMMUNITY)
Admission: EM | Admit: 2022-02-19 | Discharge: 2022-02-19 | Disposition: A | Discharge status: Home / Self Care | Payer: Medicaid - Out of State | Attending: Emergency Medicine | Admitting: Emergency Medicine

## 2022-02-19 ENCOUNTER — Ambulatory Visit (HOSPITAL_COMMUNITY)
Admission: EM | Admit: 2022-02-19 | Discharge: 2022-02-19 | Disposition: A | Discharge status: Home / Self Care | Payer: Medicaid Other | Attending: Nurse Practitioner | Admitting: Nurse Practitioner

## 2022-02-19 DIAGNOSIS — F419 Anxiety disorder, unspecified: Secondary | ICD-10-CM

## 2022-02-19 DIAGNOSIS — F410 Panic disorder [episodic paroxysmal anxiety] without agoraphobia: Secondary | ICD-10-CM | POA: Diagnosis present

## 2022-02-19 DIAGNOSIS — F2089 Other schizophrenia: Secondary | ICD-10-CM

## 2022-02-19 MED ORDER — LORAZEPAM 1 MG PO TABS
1.0000 mg | ORAL_TABLET | Freq: Once | ORAL | Status: AC
Start: 2022-02-19 — End: 2022-02-19
  Administered 2022-02-19: 1 mg via ORAL
  Filled 2022-02-19: qty 1

## 2022-02-19 MED ORDER — ALBUTEROL SULFATE (2.5 MG/3ML) 0.083% IN NEBU
2.5000 mg | INHALATION_SOLUTION | Freq: Once | RESPIRATORY_TRACT | Status: AC
  Administered 2022-02-19: 2.5 mg via RESPIRATORY_TRACT
  Filled 2022-02-19: qty 3

## 2022-02-19 MED ORDER — ALBUTEROL SULFATE HFA 108 (90 BASE) MCG/ACT IN AERS
2.0000 | INHALATION_SPRAY | RESPIRATORY_TRACT | Status: DC
  Administered 2022-02-19: 2 via RESPIRATORY_TRACT
  Filled 2022-02-19: qty 6.7

## 2022-02-19 NOTE — ED Provider Notes (Signed)
?[facility] ?Provider Note ? ? ?CSN: [id number] ?Arrival date & time: 02/19/22  0543 ? ?  ? ?History ? ?Chief Complaint  ?Patient presents with  ? Panic Attack  ? ? ?[name] is a 31 y.o. male. ? ?The history is provided by the patient and medical records.  ?[name] is a 31 y.o. male who presents to the Emergency Department complaining of cannot breathe.  He presents to the emergency department for evaluation of difficulty breathing.  Per EMS patient was uncooperative, satting 96% on room air.  He was treated with oxygen for comfort.  Level 5 caveat due to mental health disorder.  He states he cannot breathe, started today.  No fevers, chest pain.  He denies tobacco, alcohol use. ?  ? ?Home Medications ?Prior to Admission medications   ?Medication Sig Start Date End Date Taking? Authorizing Provider  ?paliperidone (INVEGA) 6 MG 24 hr tablet Take 1 tablet (6 mg total) by mouth daily for 14 days. ?Patient not taking: No sig reported 01/02/21 02/22/21  Mare Ferrari, PA-C  ?   ? ?Allergies    ?Shellfish allergy and Bee pollen   ? ?Review of Systems   ?Review of Systems  ?All other systems reviewed and are negative. ? ?Physical Exam ?Updated Vital Signs ?BP (!) 142/98 (BP Location: Left Arm)   Pulse 80   Temp 98 ?F (36.7 ?C) (Oral)   Resp 17   SpO2 98%  ?Physical Exam ?Vitals and nursing note reviewed.  ?Constitutional:   ?   Appearance: He is well-developed.  ?HENT:  ?   Head: Normocephalic and atraumatic.  ?Cardiovascular:  ?   Rate and Rhythm: Normal rate and regular rhythm.  ?   Heart sounds: No murmur heard. ?Pulmonary:  ?   Effort: Pulmonary effort is normal. No respiratory distress.  ?   Breath sounds: No stridor.  ?   Comments: Transmitted upper airway noises, no definitive wheezing. ?Abdominal:  ?   Palpations: Abdomen is soft.  ?   Tenderness: There is no abdominal tenderness. There is no guarding or rebound.  ?Musculoskeletal:     ?   General: No tenderness.   ?Skin: ?   General: Skin is warm and dry.  ?Neurological:  ?   Mental Status: He is alert and oriented to person, place, and time.  ?Psychiatric:  ?   Comments: Anxious appearing.,  Poor eye contact.  ? ? ?ED Results / Procedures / Treatments   ?Labs ?(all labs ordered are listed, but only abnormal results are displayed) ?Labs Reviewed - No data to display ? ?EKG ?None ? ?Radiology ?No results found. ? ?Procedures ?Procedures  ? ? ?Medications Ordered in ED ?Medications  ?LORazepam (ATIVAN) tablet 1 mg (1 mg Oral Given 02/19/22 8242)  ?albuterol (PROVENTIL) (2.5 MG/3ML) 0.083% nebulizer solution 2.5 mg (2.5 mg Nebulization Given 02/19/22 0624)  ? ? ?ED Course/ Medical Decision Making/ A&P ?  ?                        ?Medical Decision Making ?Risk ?Prescription drug management. ? ? ?Patient with history of schizophrenia, alcohol abuse, malingering here for evaluation of difficulty breathing.  On initial assessment patient not willing to communicate much saying he cannot breathe.  He did request nebulizer treatments to nursing.  He does have transmitted upper airway noises but no clear wheezing on evaluation.  He was treated with an albuterol nebulizer treatment.  He was also  given Ativan for anxiety.  On reassessment after treatment patient is resting calm and comfortable on the stretcher, satting 100%.  On repeat lung exam he has good air movement bilaterally with no transmitted upper airway noises or wheezes.  No clinical evidence of status asthmaticus, pneumothorax, pneumonia.  Patient for the most part is not willing to discuss things.  He denies SI, HI.  He states he is currently unhoused.  At this point in time he is medically stable for outpatient psychiatric treatment.  Suspect his difficulty breathing on ED presentation was more due to underlying anxiety over true lung disorder.  Discussed outpatient follow-up and return precautions. ? ? ? ? ? ? ? ?Final Clinical Impression(s) / ED Diagnoses ?Final diagnoses:   ?Anxiety  ? ? ?Rx / DC Orders ?ED Discharge Orders   ? ? None  ? ?  ? ? ?  ?Quintella Reichert, MD ?02/19/22 [phone] ? ?

## 2022-02-19 NOTE — ED Notes (Signed)
Pt discharged in no acute distress. Denied SI/HI/AVH. A&O x4, ambulatory. Pt declined for RN to review AVS instructions. Belongings returned to pt intact from black locker. Pt escorted to front lobby by staff and security. Safety maintained.  ?

## 2022-02-19 NOTE — Discharge Instructions (Addendum)

## 2022-02-19 NOTE — ED Provider Notes (Signed)
Behavioral Health Urgent Care Medical Screening Exam ? ?Patient Name: [name] ?MRN: [id number] ?Date of Evaluation: 02/19/22 ?Chief Complaint:   ?Diagnosis:  ?Final diagnoses:  ?Other schizophrenia (HCC)  ? ? ?History of Present illness: [name] is a 31 y.o. male with a history of paranoid schizophrenia, presents voluntarily at [facility] for psychiatric evaluation. Patient is well known to this provider. Patient states that he does not know why he is here at the [facility].  Patient presents frequently to the ED with similar presentations and vague about reasons for his visit. Patient states that he is homeless and that he is having trouble breathing. Patient has some upper respiratory wheezing. Patient was given an albuterol inhaler treatment with decreased wheezing. Patient vitals were WNL and  was 98% on RA.  ? ?Patient is alert oriented x4, calm and cooperative and does not appear to be responding to any internal or external cooperative. Patient is not currently being followed by any outpatient providers at this time. Patient denies any suicidal thoughts, homicidal thoughts, or auditory or visual hallucinations. Patient appears stable and at his baseline and will be discharged and with recommendation to follow up with [facility] outpatient clinic for medication management.  ?    ? ?Psychiatric Specialty Exam ? ?Presentation  ?General Appearance:Disheveled ? ?Eye Contact:Fair ? ?Speech:Clear and Coherent ? ?Speech Volume:Decreased ? ?Handedness:Right ? ? ?Mood and Affect  ?Mood:Depressed ? ?Affect:Appropriate ? ? ?Thought Process  ?Thought Processes:Coherent ? ?Descriptions of Associations:Intact ? ?Orientation:Full (Time, Place and Person) ? ?Thought Content:WDL ? Diagnosis of Schizophrenia or Schizoaffective disorder in past: Yes ? Duration of Psychotic Symptoms: Greater than six months ? Hallucinations:None ?denies AVH ? ?Ideas of Reference:None ? ?Suicidal Thoughts:No ? ?Homicidal Thoughts:No ? ? ?Sensorium   ?Memory:Remote Fair ? ?Judgment:Fair ? ?Insight:Fair ? ? ?Executive Functions  ?Concentration:Fair ? ?Attention Span:Poor ? ?Recall:Poor ? ?Fund of Knowledge:Poor ? ?Language:Poor ? ? ?Psychomotor Activity  ?Psychomotor Activity:Normal ? ? ?Assets  ?Assets:Other (comment) ? ? ?Sleep  ?Sleep:Poor ? ?Number of hours: -1 ? ? ?No data recorded ? ?Physical Exam: ?Physical Exam ?HENT:  ?   Head: Normocephalic and atraumatic.  ?   Nose: Nose normal.  ?Eyes:  ?   Pupils: Pupils are equal, round, and reactive to light.  ?Cardiovascular:  ?   Rate and Rhythm: Normal rate.  ?Pulmonary:  ?   Effort: Pulmonary effort is normal.  ?Abdominal:  ?   General: Abdomen is flat.  ?Musculoskeletal:     ?   General: Normal range of motion.  ?   Cervical back: Normal range of motion.  ?Skin: ?   General: Skin is warm.  ?Neurological:  ?   General: No focal deficit present.  ?   Mental Status: He is alert.  ?Psychiatric:     ?   Attention and Perception: He is inattentive.     ?   Mood and Affect: Mood is depressed.     ?   Speech: Speech normal.     ?   Behavior: Behavior normal.     ?   Thought Content: Thought content is not paranoid or delusional. Thought content does not include homicidal or suicidal ideation. Thought content does not include homicidal or suicidal plan.     ?   Cognition and Memory: Cognition normal.     ?   Judgment: Judgment is impulsive.  ? ?Review of Systems  ?Constitutional: Negative.   ?HENT: Negative.    ?Eyes: Negative.   ?Respiratory: Negative.    ?  Cardiovascular: Negative.   ?Gastrointestinal: Negative.   ?Genitourinary: Negative.   ?Musculoskeletal: Negative.   ?Skin: Negative.   ?Neurological: Negative.   ?Endo/Heme/Allergies: Negative.   ?Psychiatric/Behavioral:  The patient is nervous/anxious.   ?Blood pressure 135/71, pulse 68, temperature 98.7 ?F (37.1 ?C), temperature source Oral, resp. rate 18, SpO2 100 %. There is no height or weight on file to calculate BMI. ? ?Musculoskeletal: ?Strength & Muscle  Tone: within normal limits ?Gait & Station: unsteady ?Patient leans: N/A ? ? ?[facility] Discharge Disposition for Follow up and Recommendations: ?Based on my evaluation the patient does not appear to have an emergency medical condition and can be discharged with resources and follow up care in outpatient services for Medication Management and Individual Therapy ? ? ?Jasper Riling, NP ?02/19/2022, 3:11 AM ? ?

## 2022-02-19 NOTE — ED Triage Notes (Addendum)
Patient BIB EMS. Called out for a panic attack. Did not cooperate with EMS. 96% room air. They put him on oxygen for comfort. Ambulatory from EMS bay to room.  ? ?When asked what was wrong patient just glared at RN and did not respond to questions. He said he just wants a nebulizer with the mist. Said not an inhaler.  ?

## 2022-02-19 NOTE — ED Notes (Signed)
Pt with audible wheezing. O2 sat 100% on room air. Respiratory rate 18, even and unlabored. Albuterol inhaler administered per MAR. Ice water given. No signs of acute distress noted. Will continue to monitor for safety.  ?

## 2022-02-19 NOTE — ED Notes (Signed)
Pt asleep on couch in assessment room. Respirations even and unlabored. No wheezing noted at this time. Will continue to monitor for safety.  ?

## 2022-02-20 ENCOUNTER — Ambulatory Visit (HOSPITAL_COMMUNITY)
Admission: EM | Admit: 2022-02-20 | Discharge: 2022-02-21 | Disposition: A | Discharge status: Home / Self Care | Payer: Medicaid Other | Attending: Psychiatry | Admitting: Psychiatry

## 2022-02-20 DIAGNOSIS — R4589 Other symptoms and signs involving emotional state: Secondary | ICD-10-CM

## 2022-02-20 DIAGNOSIS — F200 Paranoid schizophrenia: Secondary | ICD-10-CM | POA: Insufficient documentation

## 2022-02-20 NOTE — Progress Notes (Signed)
TRIAGE: ROUTINE ? ? 02/20/22 1925  ?[facility] Triage Screening (Walk-ins at [facility] only)  ?How Did You Hear About [place]? Self  ?What Is the Reason for Your Visit/Call Today? Pt presents to the [facility] accompanied by [employer]; they state pt came to the [facility] due to concerns for his life because he thought that someone was going to hurt him and he was going to harm himself. When pt was asked why he came to the [facility], he states he has no money, has had nothing to eat, and is homeless. When asked if he still has his apartment in [facility], pt stated, "I don't know." When asked if his services through his ACT Team had been d/c (as was reported several weeks ago by his ACT Team) or if he's been taking his medicaiton, he again answered, "I don't know." Pt denies SI, HI, AVH, or SA.  ?How Long Has This Been Causing You Problems? > than 6 months  ?Have You Recently Had Any Thoughts About Hurting Yourself? No  ?Are You Planning to Commit Suicide/Harm Yourself At This time? No  ?Have you Recently Had Thoughts About Hurting Someone [name]? No  ?Are You Planning To Harm Someone At This Time? No  ?Are you currently experiencing any auditory, visual or other hallucinations? No  ?Please explain the hallucinations you are currently experiencing: N/A  ?Have You Used Any Alcohol or Drugs in the Past 24 Hours? No  ?How long ago did you use Drugs or Alcohol? N/A  ?What Did You Use and How Much? N/A  ?Do you have any current medical co-morbidities that require immediate attention? No  ?Clinician description of patient physical appearance/behavior: Pt is dressed in age-appropriate attire, though his clothing is not clean, he has his shoes off and his pants are hanging low off of his waist. Pt requested the light in the room be kept off, stating he has had no where to sleep.  ?What Do You Feel Would Help You the Most Today? Food Assistance;Housing Assistance;Financial Resources  ?If access to [facility] was not available, would you have sought care in the  Emergency Department? No  ?Determination of Need Routine (7 days)  ?Options For Referral Medication Management;Outpatient Therapy  ? ? ?

## 2022-02-20 NOTE — ED Notes (Signed)
Patient refused temp. Pulled out thermometer. ?

## 2022-02-21 ENCOUNTER — Encounter (HOSPITAL_COMMUNITY): Payer: Self-pay | Admitting: Emergency Medicine

## 2022-02-21 ENCOUNTER — Other Ambulatory Visit: Payer: Self-pay

## 2022-02-21 ENCOUNTER — Emergency Department (HOSPITAL_COMMUNITY)
Admission: EM | Admit: 2022-02-21 | Discharge: 2022-02-22 | Discharge status: Against Medical Advice | Payer: Medicaid - Out of State | Source: Home / Self Care | Attending: Emergency Medicine | Admitting: Emergency Medicine

## 2022-02-21 ENCOUNTER — Emergency Department (HOSPITAL_COMMUNITY)
Admission: EM | Admit: 2022-02-21 | Discharge: 2022-02-21 | Disposition: A | Discharge status: Home / Self Care | Payer: Medicaid - Out of State | Attending: Emergency Medicine | Admitting: Emergency Medicine

## 2022-02-21 ENCOUNTER — Encounter (HOSPITAL_COMMUNITY): Payer: Self-pay

## 2022-02-21 DIAGNOSIS — H538 Other visual disturbances: Secondary | ICD-10-CM | POA: Insufficient documentation

## 2022-02-21 DIAGNOSIS — R441 Visual hallucinations: Secondary | ICD-10-CM | POA: Insufficient documentation

## 2022-02-21 DIAGNOSIS — H539 Unspecified visual disturbance: Secondary | ICD-10-CM

## 2022-02-21 DIAGNOSIS — Z20822 Contact with and (suspected) exposure to covid-19: Secondary | ICD-10-CM | POA: Insufficient documentation

## 2022-02-21 DIAGNOSIS — H5789 Other specified disorders of eye and adnexa: Secondary | ICD-10-CM | POA: Diagnosis present

## 2022-02-21 DIAGNOSIS — R443 Hallucinations, unspecified: Secondary | ICD-10-CM | POA: Insufficient documentation

## 2022-02-21 LAB — COMPREHENSIVE METABOLIC PANEL
ALT: 14 U/L (ref 0–44)
AST: 22 U/L (ref 15–41)
Albumin: 4.3 g/dL (ref 3.5–5.0)
Alkaline Phosphatase: 43 U/L (ref 38–126)
Anion gap: 7 (ref 5–15)
BUN: 16 mg/dL (ref 6–20)
CO2: 26 mmol/L (ref 22–32)
Calcium: 9.2 mg/dL (ref 8.9–10.3)
Chloride: 104 mmol/L (ref 98–111)
Creatinine, Ser: 1.2 mg/dL (ref 0.61–1.24)
GFR, Estimated: >60 mL/min (ref >60)
Glucose, Bld: 86 mg/dL (ref 70–99)
Potassium: 4 mmol/L (ref 3.5–5.1)
Sodium: 137 mmol/L (ref 135–145)
Total Bilirubin: 0.7 mg/dL (ref 0.3–1.2)
Total Protein: 7.2 g/dL (ref 6.5–8.1)

## 2022-02-21 LAB — CBC WITH DIFFERENTIAL/PLATELET
Abs Immature Granulocytes: 0.02 10*3/uL (ref 0.00–0.07)
Basophils Absolute: 0.1 10*3/uL (ref 0.0–0.1)
Basophils Relative: 1 %
Eosinophils Absolute: 0.5 10*3/uL (ref 0.0–0.5)
Eosinophils Relative: 8 %
HCT: 45 % (ref 39.0–52.0)
Hemoglobin: 15.5 g/dL (ref 13.0–17.0)
Immature Granulocytes: 0 %
Lymphocytes Relative: 52 %
Lymphs Abs: 3.5 10*3/uL (ref 0.7–4.0)
MCH: 31.2 pg (ref 26.0–34.0)
MCHC: 34.4 g/dL (ref 30.0–36.0)
MCV: 90.5 fL (ref 80.0–100.0)
Monocytes Absolute: 0.4 10*3/uL (ref 0.1–1.0)
Monocytes Relative: 6 %
Neutro Abs: 2.2 10*3/uL (ref 1.7–7.7)
Neutrophils Relative %: 33 %
Platelets: 246 10*3/uL (ref 150–400)
RBC: 4.97 MIL/uL (ref 4.22–5.81)
RDW: 12.6 % (ref 11.5–15.5)
WBC: 6.6 10*3/uL (ref 4.0–10.5)
nRBC: 0 % (ref 0.0–0.2)

## 2022-02-21 LAB — ETHANOL: Alcohol, Ethyl (B): <10 mg/dL (ref <10)

## 2022-02-21 LAB — ACETAMINOPHEN LEVEL: Acetaminophen (Tylenol), Serum: <10 ug/mL — ABNORMAL LOW (ref 10–30)

## 2022-02-21 LAB — SALICYLATE LEVEL: Salicylate Lvl: <7 mg/dL — ABNORMAL LOW (ref 7.0–30.0)

## 2022-02-21 NOTE — ED Provider Notes (Signed)
Behavioral Health Urgent Care Medical Screening Exam ? ?Patient Name: [name] ?MRN: [id number] ?Date of Evaluation: 02/21/22 ?Chief Complaint:   ?Diagnosis:  ?Final diagnoses:  ?Paranoid schizophrenia, chronic condition (HCC)  ?Anxious appearance  ? ? ?History of Present illness: [name] is a 31 y.o. male. History of Paranoia schizophrenia,  Present to [facility] by [employer],  per for paranoia,  per the patient he thought someone wanted to hurt him. When asked most question pt report "I don't man".   ? ? ?Copied from TTS; Pt presents to the [facility] accompanied by [employer]; they state pt came to the [facility] due to concerns for his life because he thought that someone was going to hurt him and he was going to harm himself. When pt was asked why he came to the [facility], he states he has no money, has had nothing to eat, and is homeless. When asked if he still has his apartment in [facility], pt stated, "I don't know." When asked if his services through his ACT Team had been d/c (as was reported several weeks ago by his ACT Team) or if he's been taking his medicaiton, he again answered, "I don't know." Pt denies SI, HI, AVH, or SA ? ? ?Observation of pt,  he is alert and oriented x 4,  speech is clear,  mood anxious affect congruent with mood.  Pt denies SI,  HI, AVH.  Pt stated he just wants to rest here for awhile. Pt is well know to [facility] psychiatric services,  appear to be at baseline,  pt does not have a current ACT team  ? ?Food and drink provided to patient ? ?Pt does not meet criteria for psychiatric inpatient admission,  supportive therapy provided,  discuss calling 911, coming to the ED or suicide Hotline.   ?  ?Psychiatric Specialty Exam ? ?Presentation  ?General Appearance:Appropriate for Environment ? ?Eye Contact:Fair ? ?Speech:Clear and Coherent ? ?Speech Volume:Normal ? ?Handedness:Ambidextrous ? ? ?Mood and Affect  ?Mood:Anxious ? ?Affect:Appropriate ? ? ?Thought Process  ?Thought  Processes:Coherent ? ?Descriptions of Associations:Circumstantial ? ?Orientation:Full (Time, Place and Person) ? ?Thought Content:Abstract Reasoning ? Diagnosis of Schizophrenia or Schizoaffective disorder in past: Yes ? Duration of Psychotic Symptoms: Greater than six months ? Hallucinations:None ?denies AVH ? ?Ideas of Reference:None ? ?Suicidal Thoughts:No ? ?Homicidal Thoughts:No ? ? ?Sensorium  ?Memory:Remote Fair ? ?Judgment:Fair ? ?Insight:Fair ? ? ?Executive Functions  ?Concentration:Fair ? ?Attention Span:Fair ? ?Recall:Fair ? ?Fund of Knowledge:Fair ? ?Language:Fair ? ? ?Psychomotor Activity  ?Psychomotor Activity:Normal ? ? ?Assets  ?Assets:Communication Skills ? ? ?Sleep  ?Sleep:Good ? ?Number of hours: -1 ? ? ?Nutritional Assessment (For OBS and FBC admissions only) ?Has the patient had a weight loss or gain of 10 pounds or more in the last 3 months?: No ?Has the patient had a decrease in food intake/or appetite?: No ?Does the patient have dental problems?: No ?Does the patient have eating habits or behaviors that may be indicators of an eating disorder including binging or inducing vomiting?: No ?Has the patient recently lost weight without trying?: 0 ?Has the patient been eating poorly because of a decreased appetite?: 0 ?Malnutrition Screening Tool Score: 0 ? ? ? ?Physical Exam: ?Physical Exam ?HENT:  ?   Head: Normocephalic.  ?   Nose: Nose normal.  ?Cardiovascular:  ?   Rate and Rhythm: Normal rate.  ?Pulmonary:  ?   Effort: Pulmonary effort is normal.  ?Musculoskeletal:     ?   General: Normal range  of motion.  ?   Cervical back: Normal range of motion.  ?Skin: ?   General: Skin is warm.  ?Neurological:  ?   General: No focal deficit present.  ?   Mental Status: He is alert.  ?Psychiatric:     ?   Mood and Affect: Mood normal.     ?   Behavior: Behavior normal.     ?   Thought Content: Thought content normal.     ?   Judgment: Judgment normal.  ? ?Review of Systems  ?Constitutional: Negative.    ?HENT: Negative.    ?Eyes: Negative.   ?Respiratory: Negative.    ?Cardiovascular: Negative.   ?Gastrointestinal: Negative.   ?Genitourinary: Negative.   ?Musculoskeletal: Negative.   ?Skin: Negative.   ?Neurological: Negative.   ?Endo/Heme/Allergies: Negative.   ?Psychiatric/Behavioral:  The patient is nervous/anxious.   ?Blood pressure 127/82, pulse (!) 57, resp. rate 18, SpO2 100 %. There is no height or weight on file to calculate BMI. ? ?Musculoskeletal: ?Strength & Muscle Tone: within normal limits ?Gait & Station: normal ?Patient leans: N/A ? ? ?[facility] MSE Discharge Disposition for Follow up and Recommendations: ?Based on my evaluation the patient does not appear to have an emergency medical condition and can be discharged with resources and follow up care in outpatient services for Medication Management and Individual Therapy ? ? ?Sindy Guadeloupe, NP ?02/21/2022, 1:06 AM ? ?

## 2022-02-21 NOTE — ED Provider Triage Note (Signed)
Emergency Medicine Provider Triage Evaluation Note ? ?[name] , a 31 y.o. male  was evaluated in triage.  Pt complains of not being able to see.  States "I cant see anything".  Denies injury or trauma to the eye.  Denies SI/HI/AVH. ? ?Review of Systems  ?Positive: Vision problem ?Negative: fever ? ?Physical Exam  ?BP 123/70   Pulse 66   Temp 97.8 ?F (36.6 ?C) (Oral)   Resp 18   SpO2 100%  ? ?Gen:   Awake, no distress   ?Resp:  Normal effort  ?MSK:   Moves extremities without difficulty  ?Other:  No lid edema or erythema, visually tracking during exam, blinks to threat ? ?Medical Decision Making  ?Medically screening exam initiated at 5:17 AM.  Appropriate orders placed.  [name] was informed that the remainder of the evaluation will be completed by another provider, this initial triage assessment does not replace that evaluation, and the importance of remaining in the ED until their evaluation is complete. ? ?Eye problem.  States he cannot see but clearly tracking during exam and blinks to threat.  Denies SI/HI/AVH. ?  ?Adrian Hatchet, PA-C ?02/21/22 0518 ? ?

## 2022-02-21 NOTE — ED Triage Notes (Signed)
Pt reported to ED with c/o inability to see, however pts eyes noted to track movement in room and pt able to ambulate freely without any assistance or guidance. States he does not know why he cannot see and says he doesn't know if he is having visual hallucinations. Pt denies any SI/HI at this time.  ?

## 2022-02-21 NOTE — ED Notes (Signed)
When attempting to obtain lab work patient removed bracelet and blood pressure cuff and states he is leaving. EDP notified and speaking with patient.  ?

## 2022-02-21 NOTE — Discharge Instructions (Signed)
F/u with outpatient resources  ?

## 2022-02-21 NOTE — ED Provider Notes (Signed)
?[facility]-EMERGENCY DEPT ?Provider Note ? ? ?CSN: [id number] ?Arrival date & time: 02/21/22  2228 ? ?  ? ?History ? ?Chief Complaint  ?Patient presents with  ? Blurred Vision  ? Hallucinations  ? ? ?[name] is a 31 y.o. male with PMHx schizophrenia, depression, polysubstance abuse, homelessness who presents to the ED today via EMS with complaint of hallucinations/blurred vision. Per triage report pt uncooperative with EMS. Per chart review pt was seen at [facility] earlier today and discharged < 2 hours ago. He was there for same complaint however very difficult to assess anymore information as pt continued to state "I don't know" to any question. He was noted to have nystagmus with optokinetic strip phone application. He was cleared from a neurological/ophthalmological and plan was for screening psychiatric labs and TTS consult for likely paranoia and visual hallucinations. Pt's symptoms completely resolved when he was ready to get blood drawn and so he was discharged home.  ? ?When asked if his symptoms returned when he was discharged he states "I don't know." Pt states "a lot has been going on" however is unwilling to elaborate further. He denies SI or HI.  ? ?The history is provided by the patient, medical records and the EMS personnel.  ? ?  ? ?Home Medications ?Prior to Admission medications   ?Medication Sig Start Date End Date Taking? Authorizing Provider  ?paliperidone (INVEGA) 6 MG 24 hr tablet Take 1 tablet (6 mg total) by mouth daily for 14 days. ?Patient not taking: No sig reported 01/02/21 02/22/21  Mare Ferrari, PA-C  ?   ? ?Allergies    ?Shellfish allergy and Bee pollen   ? ?Review of Systems   ?Review of Systems  ?Constitutional:  Negative for fever.  ?Eyes:  Positive for visual disturbance.  ?Psychiatric/Behavioral:  Positive for hallucinations.   ?All other systems reviewed and are negative. ? ?Physical Exam ?Updated Vital Signs ?BP 131/88   Pulse 66   Temp 97.7 ?F (36.5 ?C)  (Oral)   Resp 16   Ht 5\' 5"  (1.651 m)   Wt 72.6 kg   SpO2 95%   BMI 26.63 kg/m?  ?Physical Exam ?Vitals and nursing note reviewed.  ?Constitutional:   ?   Appearance: He is not ill-appearing.  ?HENT:  ?   Head: Normocephalic and atraumatic.  ?Eyes:  ?   Extraocular Movements: Extraocular movements intact.  ?   Conjunctiva/sclera: Conjunctivae normal.  ?   Pupils: Pupils are equal, round, and reactive to light.  ?Cardiovascular:  ?   Rate and Rhythm: Normal rate and regular rhythm.  ?Pulmonary:  ?   Effort: Pulmonary effort is normal.  ?   Breath sounds: Normal breath sounds.  ?Skin: ?   General: Skin is warm and dry.  ?   Coloration: Skin is not jaundiced.  ?Neurological:  ?   Mental Status: He is alert.  ?Psychiatric:     ?   Attention and Perception: He is inattentive.     ?   Behavior: Behavior is uncooperative.  ? ? ?ED Results / Procedures / Treatments   ?Labs ?(all labs ordered are listed, but only abnormal results are displayed) ?Labs Reviewed  ?ACETAMINOPHEN LEVEL - Abnormal; Notable for the following components:  ?    Result Value  ? Acetaminophen (Tylenol), Serum <10 (*)   ? All other components within normal limits  ?SALICYLATE LEVEL - Abnormal; Notable for the following components:  ? Salicylate Lvl <7.0 (*)   ? All  other components within normal limits  ?RESP PANEL BY RT-PCR (FLU A&B, COVID) ARPGX2  ?COMPREHENSIVE METABOLIC PANEL  ?ETHANOL  ?CBC WITH DIFFERENTIAL/PLATELET  ?RAPID URINE DRUG SCREEN, HOSP PERFORMED  ? ? ?EKG ?EKG Interpretation ? ?Date/Time:  Tuesday Feb 21 2022 23:20:10 EDT ?Ventricular Rate:  67 ?PR Interval:  133 ?QRS Duration: 91 ?QT Interval:  395 ?QTC Calculation: 417 ?R Axis:   75 ?Text Interpretation: Sinus rhythm LVH by voltage ST elevation suggests acute pericarditis No significant change was found Confirmed by [name] [phone]) on 02/21/2022 11:32:53 PM ? ?Radiology ?No results found. ? ?Procedures ?Procedures  ? ? ?Medications Ordered in ED ?Medications - No data to  display ? ?ED Course/ Medical Decision Making/ A&P ?  ?                        ?Medical Decision Making ?31 year old male presenting to the ED today with complaint of visual hallucinations vs blurred vision. Seen at [facility] < 2 hours ago and ultimately discharged after symptoms resolved. Found to have appropriate eye movement with optokinetic strip testing and ultimately thought that symptoms likely psychiatric in nature. Given symptoms resolved and pt denied SI or HI no labs obtained and he was discharged home. He is here with same complaints. He is very uncooperative. His PERRL and EOMI intact. I do not feel he requires additional workup for vision complaint at this time as it is likely s/2 auditory hallucinations. Will plan for medical screening labs and TTS consult.  ? ?CBC without leukocytosis. Hgb stable.  ?CMP without electrolyte abnormalities. LFTs unremarkable.  ?ETOH, Tylenol, and Aspirin levels negative ? ?Pt is medically cleared at this time. TTS consult placed.  ? ?Per nursing staff pt refused to be dressed out. He was seen shortly afterwards walking out with his belongings. I was unable to speak with him prior to him eloping from the hospital.  ? ?Amount and/or Complexity of Data Reviewed ?Labs: ordered. ? ? ? ? ? ? ? ? ? ?Final Clinical Impression(s) / ED Diagnoses ?Final diagnoses:  ?Hallucinations  ? ? ?Rx / DC Orders ?ED Discharge Orders   ? ? None  ? ?  ? ? ?  ?Tanda Rockers, PA-C ?02/22/22 0019 ? ?  ?[name], MD ?02/22/22 0118 ? ?

## 2022-02-21 NOTE — Discharge Instructions (Addendum)
Please follow-up with your outpatient psychiatric team.  Please rest and stay hydrated. ?

## 2022-02-21 NOTE — ED Triage Notes (Signed)
Pt BIB EMS with reports of hallucinations and blurred vision since today. Pt was uncooperative with EMS.  ?

## 2022-02-21 NOTE — ED Provider Notes (Signed)
?[name] EMERGENCY DEPARTMENT ?Provider Note ? ? ?CSN: [id number] ?Arrival date & time: 02/21/22  0447 ? ?  ? ?History ? ?Chief Complaint  ?Patient presents with  ? Eye Problem  ? ? ?[name] is a 31 y.o. male. ? ?The history is provided by the patient and medical records. No language interpreter was used.  ?Eye Problem ?Location:  Both eyes ?Quality:  Unable to specify ?Severity:  Unable to specify ?Onset quality:  Unable to specify ?Timing:  Constant ?Progression:  Unable to specify ?Relieved by:  Nothing ?Worsened by:  Nothing ?Ineffective treatments:  None tried ?Associated symptoms: decreased vision   ?Associated symptoms: no discharge, no headaches, no itching, no nausea, no photophobia, no redness and no vomiting   ?Associated symptoms comment:  Decreased vision vs hallucinations ?Risk factors: no conjunctival hemorrhage, no exposure to pinkeye and no recent URI   ? ?  ? ?Home Medications ?Prior to Admission medications   ?Medication Sig Start Date End Date Taking? Authorizing Provider  ?paliperidone (INVEGA) 6 MG 24 hr tablet Take 1 tablet (6 mg total) by mouth daily for 14 days. ?Patient not taking: No sig reported 01/02/21 02/22/21  Mare Ferrari, PA-C  ?   ? ?Allergies    ?Shellfish allergy and Bee pollen   ? ?Review of Systems   ?Review of Systems  ?Constitutional:  Negative for chills, fatigue and fever.  ?HENT:  Negative for congestion.   ?Eyes:  Positive for visual disturbance. Negative for photophobia, pain, discharge, redness and itching.  ?Respiratory:  Negative for cough, chest tightness and shortness of breath.   ?Cardiovascular:  Negative for chest pain, palpitations and leg swelling.  ?Gastrointestinal:  Negative for abdominal pain, constipation, diarrhea, nausea and vomiting.  ?Genitourinary:  Negative for dysuria and flank pain.  ?Musculoskeletal:  Negative for back pain, neck pain and neck stiffness.  ?Skin:  Negative for rash and wound.  ?Neurological:  Negative for  light-headedness and headaches.  ?Psychiatric/Behavioral:  Positive for hallucinations. Negative for agitation, self-injury and suicidal ideas.   ?All other systems reviewed and are negative. ? ?Physical Exam ?Updated Vital Signs ?BP 123/70   Pulse 66   Temp 97.8 ?F (36.6 ?C) (Oral)   Resp 18   SpO2 100%  ?Physical Exam ?Vitals and nursing note reviewed.  ?Constitutional:   ?   General: He is not in acute distress. ?   Appearance: He is well-developed. He is not ill-appearing, toxic-appearing or diaphoretic.  ?HENT:  ?   Head: Normocephalic and atraumatic.  ?   Mouth/Throat:  ?   Mouth: Mucous membranes are moist.  ?Eyes:  ?   General: Lids are normal. Gaze aligned appropriately. No scleral icterus.    ?   Right eye: No discharge.     ?   Left eye: No discharge.  ?   Extraocular Movements: Extraocular movements intact.  ?   Right eye: Normal extraocular motion and no nystagmus.  ?   Left eye: Normal extraocular motion and no nystagmus.  ?   Conjunctiva/sclera: Conjunctivae normal.  ?   Right eye: Right conjunctiva is not injected. No chemosis, exudate or hemorrhage. ?   Left eye: Left conjunctiva is not injected. No chemosis or exudate. ?   Pupils: Pupils are equal, round, and reactive to light. Pupils are equal.  ?   Comments: Patient able to track all around the room with his eyes.  Pupils are symmetric and reactive with normal extraocular movements with distraction.  Optokinetic strip  test shows good movement bilaterally although patient reports he is not able to see but thinks it could be hallucinations.  No reported eye pain.  No erythema or tenderness around the eye.  ?Cardiovascular:  ?   Rate and Rhythm: Normal rate and regular rhythm.  ?   Heart sounds: No murmur heard. ?Pulmonary:  ?   Effort: Pulmonary effort is normal. No respiratory distress.  ?   Breath sounds: Normal breath sounds. No wheezing, rhonchi or rales.  ?Chest:  ?   Chest wall: No tenderness.  ?Abdominal:  ?   General: Abdomen is flat.   ?   Palpations: Abdomen is soft.  ?   Tenderness: There is no abdominal tenderness. There is no right CVA tenderness, left CVA tenderness, guarding or rebound.  ?Musculoskeletal:     ?   General: No swelling or tenderness.  ?   Cervical back: Neck supple. No tenderness.  ?   Right lower leg: No edema.  ?   Left lower leg: No edema.  ?Skin: ?   General: Skin is warm and dry.  ?   Capillary Refill: Capillary refill takes less than 2 seconds.  ?   Findings: No erythema.  ?Neurological:  ?   General: No focal deficit present.  ?   Mental Status: He is alert.  ?Psychiatric:     ?   Mood and Affect: Mood normal.  ? ? ?ED Results / Procedures / Treatments   ?Labs ?(all labs ordered are listed, but only abnormal results are displayed) ?Labs Reviewed  ?RESP PANEL BY RT-PCR (FLU A&B, COVID) ARPGX2  ?COMPREHENSIVE METABOLIC PANEL  ?ETHANOL  ?RAPID URINE DRUG SCREEN, HOSP PERFORMED  ?CBC WITH DIFFERENTIAL/PLATELET  ?SALICYLATE LEVEL  ?ACETAMINOPHEN LEVEL  ? ? ?EKG ?None ? ?Radiology ?No results found. ? ?Procedures ?Procedures  ? ? ?Medications Ordered in ED ?Medications - No data to display ? ?ED Course/ Medical Decision Making/ A&P ?  ?                        ?Medical Decision Making ?Amount and/or Complexity of Data Reviewed ?Labs: ordered. ? ? ? ?[name] is a 31 y.o. male with a past medical history significant for schizophrenia, depression, and documentation of previous substance abuse who presents for paranoia and hallucinations.  According to documentation, patient went to be took overnight for evaluation of possible paranoia but they did not feel he was in acute threat to himself at that time and patient was discharged.  Patient reported in triage that he was having hallucinations where he could not see anything.  He repeatedly says "I do not know" to nearly every question asked except for if he wants to hurt himself or someone else he says no.  He also says yes that he has had some congestion with allergies but  otherwise denies physical complaints.  He denies any current pain, headache, neck pain, chest pain, or other discomfort.  Denies any recent trauma.  When I walk in the room and spoke to him initially he was watching me closely with his eyes and tracked throughout the room.  Continues to say he does not know if he is having hallucinations.  No history of stroke in the chart or previous head injury. ? ?On exam, no focal neurologic deficits otherwise.  Patient moving all extremities.  Pupils were reactive and had normal extraocular movements when patient was tracking around the room but not when he was asked to  do so.  And optokinetic strip phone application was utilized showing that his eyes jumped laterally when gazing at the screen which suggest that the patient is indeed able to see. ? ?Given his reassuring exam from a vision standpoint, I have low suspicion that the patient is having an acute ophthalmologic or neurologic crisis.  Patient is denying any eye pain and his pupils were reactive bilaterally.  He was able to move his eyes in all directions when distracted and he proved that he was able to see with the OKN strips.  He did not participate for much of the other neurologic exam but he was able to move all extremities.  Speech was clear. ? ?Clinical I suspect patient is having hallucinations or other psychiatric etiology of his described vision changes.  Given his lack of head injury and reassuring exam we will hold on intracranial imaging at this time. ? ?Patient will have screening psychiatric clearance labs and will call TTS for likely paranoia and visual hallucinations. ? ? ?8:57 AM ?As patient was getting ready to get blood drawn for psychiatric medical clearance, he reports that his symptoms have all resolved and now he wants to go home.  He denies any vision changes and is feeling normal.  Continues to deny any other psychiatric complaints with no new SI or HI and he does not want to see psychiatry.   He would rather follow-up as an outpatient. ? ?As symptoms are gone, patient was reevaluated and he is able to ambulate around the room and was able to have normal extraocular movements.  Denies vision chang

## 2022-02-22 DIAGNOSIS — F411 Generalized anxiety disorder: Secondary | ICD-10-CM | POA: Insufficient documentation

## 2022-02-22 DIAGNOSIS — F200 Paranoid schizophrenia: Secondary | ICD-10-CM | POA: Insufficient documentation

## 2022-02-22 LAB — RESP PANEL BY RT-PCR (FLU A&B, COVID) ARPGX2
Influenza A by PCR: NEGATIVE
Influenza B by PCR: NEGATIVE
SARS Coronavirus 2 by RT PCR: NEGATIVE

## 2022-02-22 LAB — RAPID URINE DRUG SCREEN, HOSP PERFORMED
Amphetamines: NOT DETECTED
Barbiturates: NOT DETECTED
Benzodiazepines: NOT DETECTED
Cocaine: NOT DETECTED
Opiates: NOT DETECTED
Tetrahydrocannabinol: NOT DETECTED

## 2022-02-22 NOTE — ED Triage Notes (Signed)
Pt presents to [facility] as a walk in voluntarily and unaccompanied at ths time. Pt states " I'm tired I need to get some rest". Pt appears to be alert and oriented, but complained that his head was hurting. Pt blood pressure is WNL. Pt denies SI, HI, AVH and substance/alcohol use. Pt is routine. ?

## 2022-02-22 NOTE — ED Notes (Signed)
Attempted to have patient dress out in burgundy scrubs and yellow socks. Pt refused to get dressed in scrubs.  ?

## 2022-02-23 ENCOUNTER — Ambulatory Visit (HOSPITAL_COMMUNITY)
Admission: EM | Admit: 2022-02-23 | Discharge: 2022-02-23 | Disposition: A | Discharge status: Home / Self Care | Payer: Medicaid Other | Attending: Psychiatry | Admitting: Psychiatry

## 2022-02-23 DIAGNOSIS — R4589 Other symptoms and signs involving emotional state: Secondary | ICD-10-CM

## 2022-02-23 NOTE — ED Provider Notes (Signed)
Behavioral Health Urgent Care Medical Screening Exam  Patient Name: [name] MRN: [id number] Date of Evaluation: 02/23/22 Chief Complaint:   Diagnosis:  Final diagnoses:  None    History of Present illness: [name] is a 31 y.o. male. Pt is well know to [facility],  he come here at least 3-4 time weekly and all he want is to lay in the lobby and sleep and he get up and leave.  Tonight pt was a walk-in he  stated he just want to rest.  Don't want to be admitted.  Observation of patient, he is alert and oriented x 4,  speech clear,  mood anxious,  affect congruent with mood. Pt denies SI, HI , AVP.  Pt stated he just want to rest.  Pt is now laying in the lobby resting.  No evidence of internal or external stimuli   Recommend discharge when patient is ready to leave.  Psychiatric Specialty Exam  Presentation  General Appearance:Appropriate for Environment  Eye Contact:Fair  Speech:Clear and Coherent  Speech Volume:Normal  Handedness:Ambidextrous   Mood and Affect  Mood:Anxious  Affect:Appropriate   Thought Process  Thought Processes:Coherent  Descriptions of Associations:Circumstantial  Orientation:Full (Time, Place and Person)  Thought Content:Abstract Reasoning  Diagnosis of Schizophrenia or Schizoaffective disorder in past: Yes  Duration of Psychotic Symptoms: Greater than six months  Hallucinations:None denies AVH  Ideas of Reference:None  Suicidal Thoughts:No  Homicidal Thoughts:No   Sensorium  Memory:Immediate Fair  Judgment:Fair  Insight:Fair   Executive Functions  Concentration:Fair  Attention Span:Fair  [place]   Psychomotor Activity  Psychomotor Activity:Normal   Assets  Assets:Desire for Improvement; Housing   Sleep  Sleep:Fair  Number of hours: -1   Nutritional Assessment (For OBS and FBC admissions only) Has the patient had a weight loss or gain of 10 pounds or more in  the last 3 months?: No Has the patient had a decrease in food intake/or appetite?: No Does the patient have dental problems?: No Does the patient have eating habits or behaviors that may be indicators of an eating disorder including binging or inducing vomiting?: No Has the patient recently lost weight without trying?: 0 Has the patient been eating poorly because of a decreased appetite?: 0 Malnutrition Screening Tool Score: 0    Physical Exam: Physical Exam HENT:     Head: Normocephalic.     Nose: Nose normal.  Cardiovascular:     Rate and Rhythm: Normal rate.  Pulmonary:     Effort: Pulmonary effort is normal.  Musculoskeletal:        General: Normal range of motion.     Cervical back: Normal range of motion.  Skin:    General: Skin is warm.  Neurological:     General: No focal deficit present.     Mental Status: He is alert.  Psychiatric:        Mood and Affect: Mood normal.        Behavior: Behavior normal.        Thought Content: Thought content normal.        Judgment: Judgment normal.   Review of Systems  Constitutional: Negative.   HENT: Negative.    Eyes: Negative.   Respiratory: Negative.    Cardiovascular: Negative.   Gastrointestinal: Negative.   Genitourinary: Negative.   Musculoskeletal: Negative.   Skin: Negative.   Neurological: Negative.   Endo/Heme/Allergies: Negative.   Psychiatric/Behavioral:  The patient is nervous/anxious.   Blood pressure 113/82, pulse 91,  temperature 97.9 F (36.6 C), temperature source Oral, resp. rate 20, SpO2 97 %. There is no height or weight on file to calculate BMI.  Musculoskeletal: Strength & Muscle Tone: within normal limits Gait & Station: normal Patient leans: N/A   [facility] MSE Discharge Disposition for Follow up and Recommendations: Pt just want to rest, no looking to be admitted    Evette Georges, NP 02/23/2022, 12:32 AM

## 2022-02-24 ENCOUNTER — Emergency Department (HOSPITAL_COMMUNITY)
Admission: EM | Admit: 2022-02-24 | Discharge: 2022-02-24 | Disposition: A | Discharge status: Home / Self Care | Payer: Medicaid - Out of State | Attending: Emergency Medicine | Admitting: Emergency Medicine

## 2022-02-24 ENCOUNTER — Other Ambulatory Visit: Payer: Self-pay

## 2022-02-24 ENCOUNTER — Encounter (HOSPITAL_COMMUNITY): Payer: Self-pay

## 2022-02-24 DIAGNOSIS — Z760 Encounter for issue of repeat prescription: Secondary | ICD-10-CM | POA: Diagnosis present

## 2022-02-24 MED ORDER — HYDROXYZINE HCL 25 MG PO TABS: 25.0000 mg | ORAL_TABLET | Freq: Four times a day (QID) | ORAL | 0 refills | Status: AC

## 2022-02-24 NOTE — ED Notes (Signed)
Pt requesting Xanax.

## 2022-02-24 NOTE — ED Provider Notes (Signed)
[facility]-EMERGENCY DEPT Provider Note   CSN: [id number] Arrival date & time: 02/24/22  8115     History  Chief Complaint  Patient presents with   Medication Refill    [name] is a 31 y.o. male.  Who presents with request for medication.  Patient states that he wants his "chill pill" meaning Xanax.  I reviewed the PDMP.  Patient has no previous medications ordered.  I explained that I cannot initiate benzodiazepine use however would be happy to provide hydroxyzine.  He has no other complaints at this time.   Medication Refill     Home Medications Prior to Admission medications   Medication Sig Start Date End Date Taking? Authorizing Provider  hydrOXYzine (ATARAX) 25 MG tablet Take 1 tablet (25 mg total) by mouth every 6 (six) hours. 02/24/22  Yes Marelyn Rouser, PA-C  paliperidone (INVEGA) 6 MG 24 hr tablet Take 1 tablet (6 mg total) by mouth daily for 14 days. Patient not taking: No sig reported 01/02/21 02/22/21  Mare Ferrari, PA-C      Allergies    Shellfish allergy and Bee pollen    Review of Systems   Review of Systems  Physical Exam Updated Vital Signs BP (!) 133/97   Pulse 86   Temp 98 F (36.7 C)   Resp 14   SpO2 98%  Physical Exam Vitals and nursing note reviewed.  Constitutional:      General: He is not in acute distress.    Appearance: He is well-developed. He is not diaphoretic.     Comments: Patient trying to sleep under a blanket  HENT:     Head: Normocephalic and atraumatic.  Eyes:     General: No scleral icterus.    Conjunctiva/sclera: Conjunctivae normal.  Cardiovascular:     Rate and Rhythm: Normal rate and regular rhythm.     Heart sounds: Normal heart sounds.  Pulmonary:     Effort: Pulmonary effort is normal. No respiratory distress.     Breath sounds: Normal breath sounds.  Abdominal:     Palpations: Abdomen is soft.     Tenderness: There is no abdominal tenderness.  Musculoskeletal:     Cervical back:  Normal range of motion and neck supple.  Skin:    General: Skin is warm and dry.  Neurological:     Mental Status: He is alert.  Psychiatric:        Behavior: Behavior normal.    ED Results / Procedures / Treatments   Labs (all labs ordered are listed, but only abnormal results are displayed) Labs Reviewed - No data to display  EKG None  Radiology No results found.  Procedures Procedures    Medications Ordered in ED Medications - No data to display  ED Course/ Medical Decision Making/ A&P                           Medical Decision Making Patient is hemodynamically stable, afebrile, recently screened for emergency medical issue.  Will discharge with small amount of hydroxyzine.  He may follow-up in the outpatient setting for management of his psychiatric issues.  Problems Addressed: Medication refill: acute illness or injury           Final Clinical Impression(s) / ED Diagnoses Final diagnoses:  Medication refill    Rx / DC Orders ED Discharge Orders          Ordered    hydrOXYzine (ATARAX) 25  MG tablet  Every 6 hours        02/24/22 0830              Arthor Captain, PA-C 02/24/22 0831    Linwood Dibbles, MD 02/27/22 [phone]

## 2022-02-24 NOTE — ED Triage Notes (Signed)
Pt states that he needs his medications.

## 2022-02-24 NOTE — ED Notes (Signed)
Patient refused discharge vital signs and discharge paperwork. Patient left with a hospital blanket wrapped around his body.

## 2022-02-26 ENCOUNTER — Ambulatory Visit (HOSPITAL_COMMUNITY)
Admission: EM | Admit: 2022-02-26 | Discharge: 2022-02-26 | Disposition: A | Discharge status: Home / Self Care | Payer: Medicaid Other | Attending: Psychiatry | Admitting: Psychiatry

## 2022-02-26 ENCOUNTER — Emergency Department (HOSPITAL_COMMUNITY)
Admission: EM | Admit: 2022-02-26 | Discharge: 2022-02-26 | Disposition: A | Discharge status: Home / Self Care | Payer: Medicaid - Out of State | Attending: Emergency Medicine | Admitting: Emergency Medicine

## 2022-02-26 ENCOUNTER — Ambulatory Visit (HOSPITAL_COMMUNITY)
Admission: EM | Admit: 2022-02-26 | Discharge: 2022-02-26 | Disposition: A | Discharge status: Home / Self Care | Payer: Medicaid Other

## 2022-02-26 ENCOUNTER — Encounter (HOSPITAL_COMMUNITY): Payer: Self-pay

## 2022-02-26 ENCOUNTER — Ambulatory Visit (HOSPITAL_COMMUNITY)
Admission: RE | Admit: 2022-02-26 | Discharge: 2022-02-26 | Disposition: A | Discharge status: Home / Self Care | Payer: Medicaid Other | Attending: Psychiatry | Admitting: Psychiatry

## 2022-02-26 ENCOUNTER — Other Ambulatory Visit: Payer: Self-pay

## 2022-02-26 DIAGNOSIS — F200 Paranoid schizophrenia: Secondary | ICD-10-CM

## 2022-02-26 DIAGNOSIS — Z5900 Homelessness unspecified: Secondary | ICD-10-CM

## 2022-02-26 DIAGNOSIS — Z859 Personal history of malignant neoplasm, unspecified: Secondary | ICD-10-CM | POA: Diagnosis not present

## 2022-02-26 DIAGNOSIS — H5789 Other specified disorders of eye and adnexa: Secondary | ICD-10-CM | POA: Diagnosis present

## 2022-02-26 DIAGNOSIS — Z59819 Housing instability, housed unspecified: Secondary | ICD-10-CM | POA: Diagnosis not present

## 2022-02-26 DIAGNOSIS — R0602 Shortness of breath: Secondary | ICD-10-CM | POA: Diagnosis not present

## 2022-02-26 DIAGNOSIS — R450 Nervousness: Secondary | ICD-10-CM | POA: Insufficient documentation

## 2022-02-26 DIAGNOSIS — Z765 Malingerer [conscious simulation]: Secondary | ICD-10-CM

## 2022-02-26 NOTE — Discharge Instructions (Signed)
As we discussed, your work-up in the ER today was reassuring for acute abnormalities.  Your lungs sounds were clear and therefore no imaging was warranted.  I recommend following up with your primary care doctor for further evaluation and management of your symptoms.  Return if development of any new or worsening symptoms.

## 2022-02-26 NOTE — Progress Notes (Signed)
   02/26/22 1538  [facility] Triage Screening (Walk-ins at [facility] only)  How Did You Hear About [place]? Self  What Is the Reason for Your Visit/Call Today? Pt is a 31 yo male who presented voluntarily and unaccompanied. Pt stated he did not know why he came here but then, asked for something to eat and drink. Pt denied SI, HI, NSSH, AVH and paranoia. Pt stated "I don't know" when asked about recent alcohol or drug use.Pt has been seen at [facility], [facility] or [facility] 5/21 (earleir), 5/19, 5/18, 5/16 x 2, 5/15, 5/14 x 2 and 5/13. Pt was seen many times in recent months before May. Hx of Schizophrenia, housing instability and malingering.  How Long Has This Been Causing You Problems? > than 6 months  Have You Recently Had Any Thoughts About Hurting Yourself? No  Are You Planning to Commit Suicide/Harm Yourself At This time? No  Have you Recently Had Thoughts About Hurting Someone [name]? No  Are You Planning To Harm Someone At This Time? No  Are you currently experiencing any auditory, visual or other hallucinations? No  Please explain the hallucinations you are currently experiencing: none reported today  Have You Used Any Alcohol or Drugs in the Past 24 Hours?  ("I don't know.")  Do you have any current medical co-morbidities that require immediate attention?  Rich Reining)  Clinician description of patient physical appearance/behavior: Pt was calm, cooperative, alert and appeared oriented. Pt did not appear to be responding to internal stimuli or experiencing delusional thinking. Pt's speech and movement appeared within normal limits and appearance was unremarkable. Pt's mood seemed solemn and pt had a flat affect which was congruent. Pt's judgment and insight seemed within normal limits.  What Do You Feel Would Help You the Most Today? Food Assistance  If access to [facility] was not available, would you have sought care in the Emergency Department? No  Determination of Need Routine (7 days) (Per Hillery Jacks NP, pt is  psychiatrically cleared. Pt was given food and drink by NP in the lobby.)   Corrie Dandy T. Jimmye Norman, MS, [facility], [facility] Triage Specialist [facility]

## 2022-02-26 NOTE — ED Triage Notes (Signed)
Per EMS- Patient reports that he is SOB, but would not answer any questions when asked about SOB.  Patient speaking in complete sentences. Alert and oriented.. Patient is homeless .

## 2022-02-26 NOTE — ED Provider Notes (Signed)
Behavioral Health Urgent Care Medical Screening Exam  Patient Name: [name] MRN: [id number] Date of Evaluation: 02/26/22 Chief Complaint:   Diagnosis:  Final diagnoses:  None    History of Present illness: [name] is a 31 y.o. male.  Presents to [facility] urgent care requesting something to eat.  " I haven't ate anything all day."  He is denying suicidal or homicidal ideations denies auditory visual hallucinations.  Patient observed pacing the lobby.  Reports " I need someone to call EMS I cannot breathe."    NP evaluated patient, patient is unable to elaborate with other symptoms.  Patient denied wheezing shortness of breath or chest pain.  [name] stated " I do not know."  With most of our assessment questions.  Vitals-  117/79 blood pressure heart rate 77 satting at 96% room air unlabored breathing.  Patient does not appear to be in acute distress. Chart review patient was recently discharged from [facility] emergency department.   Multiple attempts to assist patient regarding need for mental assistance.  Denied illicit drug use.   NP attempted to oblige patient by calling nonemergency [phone] EMS who declined call. "  We will not come out, you are at higher level of care."   During evaluation [name] is sitting in no acute distress. He is alert/oriented to person and place; calm/cooperative; and mood congruent with affect.  He is speaking in a clear tone at moderate volume, and normal pace; with fair eye contact.  He has denied suicidal/self-harm/homicidal ideation, psychosis, and paranoia.  Patient has remained calm throughout assessment and has answered questions appropriately.     At this time [name] is educated and verbalizes understanding of mental health resources and other crisis services in the community. He is instructed to call 911 and present to the nearest emergency room should he experience any suicidal/homicidal ideation,  auditory/visual/hallucinations, or detrimental worsening of he mental health condition.  He was a also advised by Clinical research associate that he could call the toll-free phone on insurance card to assist with identifying in network counselors and agencies or number on back of Medicaid card to speak with care coordinator.     Psychiatric Specialty Exam  Presentation  General Appearance:Appropriate for Environment  Eye Contact:Good  Speech:Clear and Coherent  Speech Volume:Normal  Handedness:Right   Mood and Affect  Mood:Anxious  Affect:Congruent   Thought Process  Thought Processes:Linear  Descriptions of Associations:Intact  Orientation:Full (Time, Place and Person)  Thought Content:Abstract Reasoning  Diagnosis of Schizophrenia or Schizoaffective disorder in past: Yes  Duration of Psychotic Symptoms: Greater than six months  Hallucinations:None denies AVH  Ideas of Reference:None  Suicidal Thoughts:No  Homicidal Thoughts:No   Sensorium  Memory:Recent Good; Remote Good; Immediate Good  Judgment:Fair  Insight:Fair   Executive Functions  Concentration:Fair  Attention Span:Fair  Recall:Good  Fund of Knowledge:Good  Language:Good   Psychomotor Activity  Psychomotor Activity:Normal   Assets  Assets:Social Support; Transportation   Sleep  Sleep:Fair  Number of hours: -1   Nutritional Assessment (For OBS and FBC admissions only) Has the patient had a weight loss or gain of 10 pounds or more in the last 3 months?: No Has the patient had a decrease in food intake/or appetite?: No Does the patient have dental problems?: No Does the patient have eating habits or behaviors that may be indicators of an eating disorder including binging or inducing vomiting?: No Has the patient recently lost weight without trying?: 0 Has the patient been eating poorly because  of a decreased appetite?: 0 Malnutrition Screening Tool Score: 0    Physical Exam: Physical  Exam Vitals and nursing note reviewed.  Cardiovascular:     Rate and Rhythm: Normal rate and regular rhythm.  Neurological:     Mental Status: He is oriented to person, place, and time.  Psychiatric:        Mood and Affect: Mood normal.        Behavior: Behavior normal.   Review of Systems  Psychiatric/Behavioral:  Negative for depression and suicidal ideas. The patient is nervous/anxious.   All other systems reviewed and are negative. Blood pressure 117/79, pulse 77, temperature 98.4 F (36.9 C), temperature source Oral, SpO2 96 %. There is no height or weight on file to calculate BMI.  Musculoskeletal: Strength & Muscle Tone: within normal limits Gait & Station: normal Patient leans: N/A   [facility] MSE Discharge Disposition for Follow up and Recommendations: Based on my evaluation the patient does not appear to have an emergency medical condition and can be discharged with resources and follow up care in outpatient services for Individual Therapy Patient provided with food and off for the past past however he declined this past.    Oneta Rack, NP 02/26/2022, 4:01 PM

## 2022-02-26 NOTE — Progress Notes (Addendum)
Pt was triaged at 2:31am by this CSW. Pt does not meet criteria at this time. Pt denies SI/HI/ AVH. Pt reports that he is tired and scared to be alone. When CSW inquired how CSW can assist pt kept stating "I don't know." CSW inquired if pt still has his apartment in [place], and pt states "I don't know, no. I don't want to be there alone, I am scared, and someone is after me." CSW staffed that case with provider E. Nwoko, PA and pt is routine to follow up with community resources. CSW assisted pt with water and snack due to pt reporting he has not eaten all day and very thirsty.   Maryjean Ka, MSW, LCSWA 02/26/2022 3:03 AM

## 2022-02-26 NOTE — ED Notes (Signed)
When asked why pt was here, pt states "I need my meds". When asked about his eye problem, pt states "I dont know, I just need my meds".

## 2022-02-26 NOTE — ED Triage Notes (Signed)
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 Pt seen here in the past week for the same. Arrives A+O, VSS, NADN.

## 2022-02-26 NOTE — ED Provider Notes (Signed)
[facility]-EMERGENC[facility] DEPT Provider Note   CSN: [id number] Arrival date & time: 02/26/22  1711     History  Chief Complaint  Patient presents with   Shortness of Breath    [name] is a 31 y.o. male.  Patient with past medical history of depression and schizophrenia presents today with complaints of shortness of breath. When asked further for details about this, he states 'Im not short of breath anymore, I just need some oxygen and I'll be good.' He repeatedly denies being short of breath or any other symptoms at this time. Specifically, he denies fever, chills, coughing, wheezing, chest pain, leg swelling or tenderness, palpitations, hemoptysis, surgery in the last 4 weeks, history of PE or DVT, hormone use, history of cancer.   The history is provided by the patient. No language interpreter was used.  Shortness of Breath     Home Medications Prior to Admission medications   Medication Sig Start Date End Date Taking? Authorizing Provider  hydrOXYzine (ATARAX) 25 MG tablet Take 1 tablet (25 mg total) by mouth every 6 (six) hours. 02/24/22   Harris, Cammy Copa, PA-C  paliperidone (INVEGA) 6 MG 24 hr tablet Take 1 tablet (6 mg total) by mouth daily for 14 days. Patient not taking: No sig reported 01/02/21 02/22/21  Mare Ferrari, PA-C      Allergies    Shellfish allergy and Bee pollen    Review of Systems   Review of Systems  Respiratory:  Positive for shortness of breath.   All other systems reviewed and are negative.  Physical Exam Updated Vital Signs BP 113/71 (BP Location: Left Arm)   Pulse 67   Temp 98 F (36.7 C) (Oral)   Resp 18   Ht 5\' 5"  (1.651 m)   Wt 72.6 kg   SpO2 98%   BMI 26.63 kg/m  Physical Exam Vitals and nursing note reviewed.  Constitutional:      General: He is not in acute distress.    Appearance: Normal appearance. He is normal weight. He is not ill-appearing, toxic-appearing or diaphoretic.  HENT:     Head:  Normocephalic and atraumatic.  Cardiovascular:     Rate and Rhythm: Normal rate and regular rhythm.     Pulses: Normal pulses.     Heart sounds: Normal heart sounds.  Pulmonary:     Effort: Pulmonary effort is normal. No respiratory distress.     Breath sounds: Normal breath sounds.  Abdominal:     Palpations: Abdomen is soft.  Musculoskeletal:        General: Normal range of motion.     Cervical back: Normal range of motion.  Skin:    General: Skin is warm and dry.  Neurological:     General: No focal deficit present.     Mental Status: He is alert.  Psychiatric:        Mood and Affect: Mood normal.        Behavior: Behavior normal.    ED Results / Procedures / Treatments   Labs (all labs ordered are listed, but only abnormal results are displayed) Labs Reviewed - No data to display  EKG None  Radiology No results found.  Procedures Procedures    Medications Ordered in ED Medications - No data to display  ED Course/ Medical Decision Making/ A&P                           Medical Decision  Making  Patient presents today with complaints of shortness of breath.  He is afebrile, nontoxic-appearing, and in no acute distress with reassuring vital signs satting 100% on room air.  Upon my assessment, patient now states that he is no longer short of breath and denies any other symptoms or complaints at this time.  Past medical records reviewed including previous provider notes, labs and imaging.  Patient has medical history as outlined in HPI which complicates his care.  Per chart review patient has frequent ED visits with 51 visits in the past 6 months and 10 visits in the past 10 days.  Upon further chart review, it appears that the patient has presented here with similar complaints and had labs and imaging performed which have always been unremarkable for acute abnormalities.  It does appear that his last chest x-ray was on 4/29 and was normal.   Patient is in no acute  respiratory distress at this time.  Lungs are clear to auscultation bilaterally, no further imaging indicated at this time.  Patient can be ruled out for PE via PERC criteria.  With lungs clear to auscultation bilaterally low suspicion for pneumonia at this time.   Patient hemodynamically stable.  Will discharge patient to follow-up with PCP in outpatient setting.     Discussed results, findings, treatment and follow up. Patient advised of return precautions. Patient verbalized understanding and agreed with plan.  Discharged in stable condition.   Final Clinical Impression(s) / ED Diagnoses Final diagnoses:  Shortness of breath    Rx / DC Orders ED Discharge Orders     None     An After Visit Summary was printed and given to the patient.     Vear Clock 02/26/22 Ned Card, MD 02/28/22 [phone]

## 2022-02-26 NOTE — ED Provider Notes (Signed)
  [facility]-EMERGENCY DEPT [facility] Emergency Department Provider Note MRN:  [id number]  Arrival date & time: 02/26/22     Chief Complaint   Eye Problem   History of Present Illness   [name] is a 31 y.o. year-old male presents to the ED with chief complaint of "not being able to open his eyes."  He further states that he doesn't know why he is here.  He subsequently ignores the rest of my questions.     Review of Systems  Pertinent review of systems noted in HPI.    Physical Exam   Vitals:   02/26/22 0510  BP: (!) 129/52  Pulse: (!) 55  Resp: 16  Temp: (!) 97.5 F (36.4 C)  SpO2: 96%    CONSTITUTIONAL:  well-appearing, NAD, appear in his normal state of health NEURO:  Alert and oriented x 3, CN 3-12 grossly intact, opens eyes, tracks my finger EYES:  eyes equal and reactive, no erythema or signs of trauma ENT/NECK:  Supple, no stridor  CARDIO:  appears well-perfused  PULM:  No respiratory distress GI/GU:  non-distended,  MSK/SPINE:  No gross deformities, no edema, moves all extremities  SKIN:  no rash, atraumatic   *Additional and/or pertinent findings included in MDM below  Diagnostic and Interventional Summary    EKG Interpretation  Date/Time:    Ventricular Rate:    PR Interval:    QRS Duration:   QT Interval:    QTC Calculation:   R Axis:     Text Interpretation:         Labs Reviewed - No data to display  No orders to display    Medications - No data to display   Procedures  /  Critical Care Procedures  ED Course and Medical Decision Making  I have reviewed the triage vital signs, the nursing notes, and pertinent available records from the EMR.  Social Determinants Affecting Complexity of Care: Patient is homelessness. Discussed patient's lack of housing.  Resources provided for homeless shelters.  ED Course:   Patient here with eye problem.  Top differential diagnoses include malingering. Medical Decision Making Patient  complaining of not being able open his eyes.  He says this while looking at me.  He tracks appropriately.  Seen frequently and appears in his normal state of health.     Consultants: No consultations were needed in caring for this patient.   Treatment and Plan: Emergency department workup does not suggest an emergent condition requiring admission or immediate intervention beyond  what has been performed at this time. The patient is safe for discharge and has  been instructed to return immediately for worsening symptoms, change in  symptoms or any other concerns    Final Clinical Impressions(s) / ED Diagnoses     ICD-10-CM   1. Housing instability  Z59.819       ED Discharge Orders     None         Discharge Instructions Discussed with and Provided to Patient:   Discharge Instructions   None      Roxy Horseman, PA-C 02/26/22 Rennie Natter, MD 02/26/22 [phone]

## 2022-02-26 NOTE — Discharge Instructions (Signed)
Take all medications as prescribed. Keep all follow-up appointments as scheduled.  Do not consume alcohol or use illegal drugs while on prescription medications. Report any adverse effects from your medications to your primary care provider promptly.  In the event of recurrent symptoms or worsening symptoms, call 911, a crisis hotline, or go to the nearest emergency department for evaluation.   

## 2022-02-26 NOTE — ED Notes (Signed)
Pt discharged by provider.

## 2022-02-27 ENCOUNTER — Ambulatory Visit (HOSPITAL_COMMUNITY)
Admission: EM | Admit: 2022-02-27 | Discharge: 2022-02-27 | Disposition: A | Discharge status: Home / Self Care | Payer: Medicaid Other | Attending: Urology | Admitting: Urology

## 2022-02-27 DIAGNOSIS — Z1330 Encounter for screening examination for mental health and behavioral disorders, unspecified: Secondary | ICD-10-CM | POA: Insufficient documentation

## 2022-02-27 DIAGNOSIS — F200 Paranoid schizophrenia: Secondary | ICD-10-CM

## 2022-02-27 NOTE — Progress Notes (Signed)
   02/26/22 2330  [facility] Triage Screening (Walk-ins at [facility] only)  How Did You Hear About [place]? Self  What Is the Reason for Your Visit/Call Today? Jaishawn Witzke is a 31 year old male presenting as a voluntary walk-in to [facility]. When asked, how can we help you, patient stated "I need some rest, my mind is messed up, I need sleep right now". Patient denied SI, HI, psychosis and alcohol/drug usage. When asked if he was receiving any outpatient mental health resources, patient stated "no idea, no clue". Patient has had multiple ED visits. Hx of schizophrenia, housing instability and malingering.  How Long Has This Been Causing You Problems? <Week  Have You Recently Had Any Thoughts About Hurting Yourself? No  Are You Planning to Commit Suicide/Harm Yourself At This time? No  Have you Recently Had Thoughts About Hurting Someone [name]? No  Are You Planning To Harm Someone At This Time? No  Are you currently experiencing any auditory, visual or other hallucinations? No  Please explain the hallucinations you are currently experiencing: denied  Have You Used Any Alcohol or Drugs in the Past 24 Hours? No  How long ago did you use Drugs or Alcohol? denied  What Did You Use and How Much? denied  Do you have any current medical co-morbidities that require immediate attention? No  Clinician description of patient physical appearance/behavior: cooperative  What Do You Feel Would Help You the Most Today? Food Assistance;Housing Assistance ("I need some sleep")  Determination of Need Routine (7 days)  Options For Referral Medication Management;Other: Comment;Outpatient Therapy

## 2022-02-27 NOTE — ED Provider Notes (Signed)
Behavioral Health Urgent Care Medical Screening Exam  Patient Name: [name] MRN: [id number] Date of Evaluation: 02/27/22 Chief Complaint:   Diagnosis:  Final diagnoses:  None    History of Present illness: [name] is a 31 y.o. male psychiatric history of paranoid schizophrenia.  Patient presented voluntarily to [facility] for a walk-in assessment.  Of note, this is patient's 5th assessment in the past 24 hours.  Patient has been evaluated at [facility], ED, [facility], and [facility] within the last 24 hours.   This NP met with patient face to face and reviewed his chart. On evaluation, patient is sleeping but easy to arouse. He is oriented X3. He's speech is clear and coherent. No evidence of preoccupation, distractibility, or making noted during assessment.  Patient denies psychiatric or medical concerns.  He says he came to [facility] because he is hungry and needs to rest.  Patient denies suicidal ideation, homicidal ideation, auditory/visual hallucinations, paranoia, and substance abuse.   Psychiatric Specialty Exam  Presentation  General Appearance:Appropriate for Environment  Eye Contact:Good  Speech:Clear and Coherent  Speech Volume:Normal  Handedness:Right   Mood and Affect  Mood:Euthymic  Affect:Congruent   Thought Process  Thought Processes:Coherent  Descriptions of Associations:Intact  Orientation:Full (Time, Place and Person)  Thought Content:WDL  Diagnosis of Schizophrenia or Schizoaffective disorder in past: Yes  Duration of Psychotic Symptoms: Greater than six months  Hallucinations:None denies AVH  Ideas of Reference:None  Suicidal Thoughts:No  Homicidal Thoughts:No   Sensorium  Memory:Remote Poor; Recent Poor; Immediate Fair  Judgment:Poor  Insight:Fair   Executive Functions  Concentration:Fair  Attention Span:Fair  Recall:Poor  Fund of Knowledge:Fair  Language:Fair   Psychomotor Activity  Psychomotor  Activity:Normal   Assets  Assets:Desire for Improvement; Physical Health   Sleep  Sleep:Poor  Number of hours: 0 (patient says he has not slept but unable to give further detail)   Nutritional Assessment (For OBS and FBC admissions only) Has the patient had a weight loss or gain of 10 pounds or more in the last 3 months?: No Has the patient had a decrease in food intake/or appetite?: No Does the patient have dental problems?: No Does the patient have eating habits or behaviors that may be indicators of an eating disorder including binging or inducing vomiting?: No Has the patient recently lost weight without trying?: 0 Has the patient been eating poorly because of a decreased appetite?: 0 Malnutrition Screening Tool Score: 0    Physical Exam: Physical Exam Vitals and nursing note reviewed.  Constitutional:      General: He is not in acute distress.    Appearance: He is well-developed. He is not ill-appearing.  HENT:     Head: Normocephalic and atraumatic.  Eyes:     Conjunctiva/sclera: Conjunctivae normal.  Cardiovascular:     Rate and Rhythm: Normal rate.  Pulmonary:     Effort: Pulmonary effort is normal. No respiratory distress.     Breath sounds: Normal breath sounds.  Abdominal:     Palpations: Abdomen is soft.     Tenderness: There is no abdominal tenderness.  Musculoskeletal:        General: No swelling.     Cervical back: Neck supple.  Skin:    General: Skin is warm and dry.  Neurological:     Mental Status: He is alert and oriented to person, place, and time.  Psychiatric:        Attention and Perception: Attention and perception normal.  Mood and Affect: Mood normal.        Speech: Speech normal.        Behavior: Behavior normal. Behavior is cooperative.        Thought Content: Thought content normal.   Review of Systems  Constitutional: Negative.   HENT: Negative.    Eyes: Negative.   Respiratory: Negative.    Cardiovascular: Negative.    Gastrointestinal: Negative.   Genitourinary: Negative.   Musculoskeletal: Negative.   Skin: Negative.   Neurological: Negative.   Endo/Heme/Allergies: Negative.   Psychiatric/Behavioral: Negative.    There were no vitals taken for this visit. There is no height or weight on file to calculate BMI.  Musculoskeletal: Strength & Muscle Tone: within normal limits Gait & Station: normal Patient leans: Right   [place] [facility] Discharge Disposition for Follow up and Recommendations: Based on my evaluation the patient does not appear to have an emergency medical condition and can be discharged with resources and follow up care in outpatient services for Medication Management and Individual Therapy   Ophelia Shoulder, NP 02/27/2022, 5:28 AM

## 2022-02-27 NOTE — H&P (Signed)
Behavioral Health Medical Screening Exam  [name] is a 31 y.o. male with a past psychiatric history significant for paranoid schizophrenia who presents to [facility] as a walk-in.  Patient was previously seen at the ED for at [facility] emergency department.  Patient was also seen at [facility] earlier today.  When asked the reason for the visit, patient states that he needs rest.  He further added that his mind is messed up right now and that he needs some sleep.  Patient denies having a psychiatrist or therapist.  He further denies having an ACT team.  Patient is unsure of any social support and states that he denies having family members that can help or support him.  Patient denies suicidal ideations and denies a past history of suicide attempts.  Patient denies homicidal ideations.  He further denies auditory or visual hallucinations and does not appear to be responding to internal/external stimuli.  Patient denies being followed.  Patient denies alcohol consumption and illicit drug use.  Patient denies being a danger to himself and states that he will not harm himself if discharged from the facility.  Total Time spent with patient: 15 minutes  Psychiatric Specialty Exam:  Presentation  General Appearance: Appropriate for Environment; Casual  Eye Contact:Minimal  Speech:Clear and Coherent; Normal Rate  Speech Volume:Normal  Handedness:Right   Mood and Affect  Mood:Anxious  Affect:Congruent   Thought Process  Thought Processes:Coherent; Linear  Descriptions of Associations:Intact  Orientation:Full (Time, Place and Person)  Thought Content:WDL  History of Schizophrenia/Schizoaffective disorder:Yes  Duration of Psychotic Symptoms:Greater than six months  Hallucinations:Hallucinations: None  Ideas of Reference:None  Suicidal Thoughts:Suicidal Thoughts: No  Homicidal Thoughts:Homicidal Thoughts:  No   Sensorium  Memory:Immediate Good; Recent Good; Remote Good  Judgment:Fair  Insight:Present   Executive Functions  Concentration:Fair  Attention Span:Fair  [place]  Language:Good   Psychomotor Activity  Psychomotor Activity:Psychomotor Activity: Normal   Assets  Assets:Communication Skills; Social Support   Sleep  Sleep:Sleep: -- (Patient did not specify)    Physical Exam: Physical Exam Psychiatric:        Attention and Perception: Attention and perception normal. He does not perceive auditory or visual hallucinations.        Mood and Affect: Mood and affect normal.        Speech: Speech normal.        Behavior: Behavior is slowed. Behavior is cooperative.        Thought Content: Thought content normal. Thought content does not include homicidal or suicidal ideation. Thought content does not include suicidal plan.        Cognition and Memory: Cognition and memory normal.        Judgment: Judgment normal.   Review of Systems  Psychiatric/Behavioral:  Negative for depression, hallucinations, memory loss, substance abuse and suicidal ideas. The patient is not nervous/anxious and does not have insomnia.   Blood pressure 115/67, pulse 62, temperature 97.9 F (36.6 C), temperature source Oral, resp. rate 18, SpO2 98 %. There is no height or weight on file to calculate BMI.  Musculoskeletal: Strength & Muscle Tone: within normal limits Gait & Station: normal Patient leans: N/A   Recommendations:  Based on my evaluation the patient does not appear to have an emergency medical condition.  During the assessment, patient denied suicidal or homicidal ideations.  He further denied auditory or visual hallucinations and did not appear to be responding to internal/external stimuli.  Patient was able to contract for safety following the conclusion of the encounter.  Patient was discharged from our facility following the conclusion of the  encounter.  Patient denied the option for available resources before being discharged from the facility.  Malachy Mood, PA 02/27/2022, 2:07 AM

## 2022-02-27 NOTE — ED Notes (Signed)
Patient was walked out with AVS paperwork with no sxs of distress noted

## 2022-02-27 NOTE — Discharge Instructions (Addendum)

## 2022-02-27 NOTE — Progress Notes (Signed)
TRIAGE: ROUTINE   02/27/22 0420  [facility] Triage Screening (Walk-ins at [facility] only)  How Did You Hear About [place]? Self  What Is the Reason for Your Visit/Call Today? [name] is a 30 year old patient who walked to the [facility]. When asked what brought him in this evening, pt stated, "I can't sleep." When clinician inquired about where pt has been sleeping, pt was unable to answer this question. When pt was asked about if pt was having difficulties falling asleep, staying asleep, or both, pt stated he did not know. Pt also stated he did not know if he has recently spoken to his aunt. Pt denies SI, HI, AVH, NSSIB, access to guns/weapons, engagement with the legal system, or SA.  How Long Has This Been Causing You Problems? <Week  Have You Recently Had Any Thoughts About Hurting Yourself? No  Are You Planning to Commit Suicide/Harm Yourself At This time? No  Have you Recently Had Thoughts About Hurting Someone [name]? No  Are You Planning To Harm Someone At This Time? No  Are you currently experiencing any auditory, visual or other hallucinations? No  Please explain the hallucinations you are currently experiencing: Pt denies  Have You Used Any Alcohol or Drugs in the Past 24 Hours? No  How long ago did you use Drugs or Alcohol? Pt denies  What Did You Use and How Much? Pt denies  Do you have any current medical co-morbidities that require immediate attention? No  Clinician description of patient physical appearance/behavior: Pt is dressed in basketball shorts and a tank top, which is appropriate for the current weather. Pt answers yes/no questions but is unable to provide much additional information.  What Do You Feel Would Help You the Most Today? Food Assistance  If access to [facility] was not available, would you have sought care in the Emergency Department? Yes  Determination of Need Routine (7 days)  Options For Referral Medication Management;Outpatient Therapy     02/27/22 0420  [facility]  Triage Screening (Walk-ins at [facility] only)  How Did You Hear About [place]? Self  What Is the Reason for Your Visit/Call Today? [name] is a 31 year old patient who walked to the [facility]. When asked what brought him in this evening, pt stated, "I can't sleep." When clinician inquired about where pt has been sleeping, pt was unable to answer this question. When pt was asked about if pt was having difficulties falling asleep, staying asleep, or both, pt stated he did not know. Pt also stated he did not know if he has recently spoken to his aunt. Pt denies SI, HI, AVH, NSSIB, access to guns/weapons, engagement with the legal system, or SA.  How Long Has This Been Causing You Problems? <Week  Have You Recently Had Any Thoughts About Hurting Yourself? No  Are You Planning to Commit Suicide/Harm Yourself At This time? No  Have you Recently Had Thoughts About Hurting Someone [name]? No  Are You Planning To Harm Someone At This Time? No  Are you currently experiencing any auditory, visual or other hallucinations? No  Please explain the hallucinations you are currently experiencing: Pt denies  Have You Used Any Alcohol or Drugs in the Past 24 Hours? No  How long ago did you use Drugs or Alcohol? Pt denies  What Did You Use and How Much? Pt denies  Do you have any current medical co-morbidities that require immediate attention? No  Clinician description of patient physical appearance/behavior: Pt is dressed in basketball shorts and  a tank top, which is appropriate for the current weather. Pt answers yes/no questions but is unable to provide much additional information.  What Do You Feel Would Help You the Most Today? Food Assistance  If access to [facility] was not available, would you have sought care in the Emergency Department? Yes  Determination of Need Routine (7 days)  Options For Referral Medication Management;Outpatient Therapy

## 2022-03-25 ENCOUNTER — Emergency Department (HOSPITAL_COMMUNITY)
Admission: EM | Admit: 2022-03-25 | Discharge: 2022-03-25 | Disposition: A | Discharge status: Home / Self Care | Payer: Medicaid - Out of State | Attending: Emergency Medicine | Admitting: Emergency Medicine

## 2022-03-25 ENCOUNTER — Other Ambulatory Visit: Payer: Self-pay

## 2022-03-25 DIAGNOSIS — R5381 Other malaise: Secondary | ICD-10-CM | POA: Diagnosis present

## 2022-03-25 NOTE — ED Notes (Signed)
Pt unable to be found to give AVS. Pt left behind a tie-dye Marvel backpack.

## 2022-03-25 NOTE — ED Notes (Signed)
Pt given sandwich and juice

## 2022-03-25 NOTE — ED Provider Notes (Signed)
[facility]-EMERGENCY DEPT Provider Note   CSN: [id number] Arrival date & time: 03/25/22  3532     History  Chief Complaint  Patient presents with   [facility] intoxication    [name] is a 31 y.o. male.  HPI 7:52 AM-patient in room lying down with eyes closed.  I asked him what was wrong and he stated "I need to take a chill pill."  He then closes eyes and indicated that he did not want to talk anymore.    Home Medications Prior to Admission medications   Medication Sig Start Date End Date Taking? Authorizing Provider  hydrOXYzine (ATARAX) 25 MG tablet Take 1 tablet (25 mg total) by mouth every 6 (six) hours. 02/24/22   Harris, Cammy Copa, PA-C  paliperidone (INVEGA) 6 MG 24 hr tablet Take 1 tablet (6 mg total) by mouth daily for 14 days. Patient not taking: No sig reported 01/02/21 02/22/21  Mare Ferrari, PA-C      Allergies    Shellfish allergy and Bee pollen    Review of Systems   Review of Systems  Physical Exam Updated Vital Signs BP 125/85   Pulse 94   Temp 97.6 F (36.4 C) (Oral)   Resp 16   SpO2 98%  Physical Exam Vitals and nursing note reviewed.  Constitutional:      General: He is not in acute distress.    Appearance: He is well-developed. He is not ill-appearing, toxic-appearing or diaphoretic.  HENT:     Head: Normocephalic and atraumatic.     Right Ear: External ear normal.     Left Ear: External ear normal.  Eyes:     Conjunctiva/sclera: Conjunctivae normal.     Pupils: Pupils are equal, round, and reactive to light.  Neck:     Trachea: Phonation normal.  Cardiovascular:     Rate and Rhythm: Normal rate.  Pulmonary:     Effort: Pulmonary effort is normal.  Abdominal:     General: There is no distension.  Musculoskeletal:        General: Normal range of motion.     Cervical back: Normal range of motion and neck supple.  Skin:    General: Skin is warm and dry.  Neurological:     Mental Status: He is alert.     Cranial  Nerves: No cranial nerve deficit.     Motor: No abnormal muscle tone.     Coordination: Coordination normal.  Psychiatric:        Attention and Perception: He is inattentive.        Mood and Affect: Affect is flat.        Speech: He is noncommunicative.        Behavior: Behavior is uncooperative.        Cognition and Memory: Cognition is impaired.        Judgment: Judgment is impulsive and inappropriate.     ED Results / Procedures / Treatments   Labs (all labs ordered are listed, but only abnormal results are displayed) Labs Reviewed - No data to display  EKG None  Radiology No results found.  Procedures Procedures    Medications Ordered in ED Medications - No data to display  ED Course/ Medical Decision Making/ A&P Clinical Course as of 03/25/22 1238  Sat Mar 25, 2022  1237 At this time he is lying on his back, eyes open and he is able to converse some.  He states he is ready to go home.  He requests a bus pass.  He did not want to talk about anything else [EW]    Clinical Course User Index [EW] Mancel Bale, MD                           Medical Decision Making Patient with paranoid schizophrenia, presenting for evaluation of nonspecific symptoms, potentially tiredness and abuse of marijuana.  He has previously been evaluated with similar presentation at the [facility], he is typically not engaging when he presents like this.  Problems Addressed: Malaise: acute illness or injury  Amount and/or Complexity of Data Reviewed Independent Historian:     Details: He is a reluctant historian  Risk Decision regarding hospitalization. Risk Details: Patient presenting with signs and symptoms of feeling tired after smoking marijuana.  He is not forthcoming.  He does not directly indicate that he is internal responsive.  He does not appear to have an acute psychotic break.  After sleeping about 3 hours in the ED stretcher, he decided he was ready to go home and stated that.  He  left prior to receiving discharge instructions.           Final Clinical Impression(s) / ED Diagnoses Final diagnoses:  Malaise    Rx / DC Orders ED Discharge Orders     None         Mancel Bale, MD 03/25/22 1731

## 2022-03-25 NOTE — ED Triage Notes (Signed)
[facility] BIB[facility]. Pt had original c/o SOB with EMS. When asked again here pt states they are "just really high on weed". Pt is talking to themselves and slightly anxious.   No pain.

## 2022-03-25 NOTE — Discharge Instructions (Signed)
Follow-up with behavioral health urgent care for treatment of schizophrenia if needed.

## 2022-05-24 IMAGING — DX DG CHEST 1V PORT
1 series · 1 of 1 positions shown · non-contrast
Comparison: 07/24/2018

CLINICAL DATA: Shortness of breath and wheezing.  Confusion.

EXAM:
PORTABLE CHEST 1 VIEW

[chest ap]
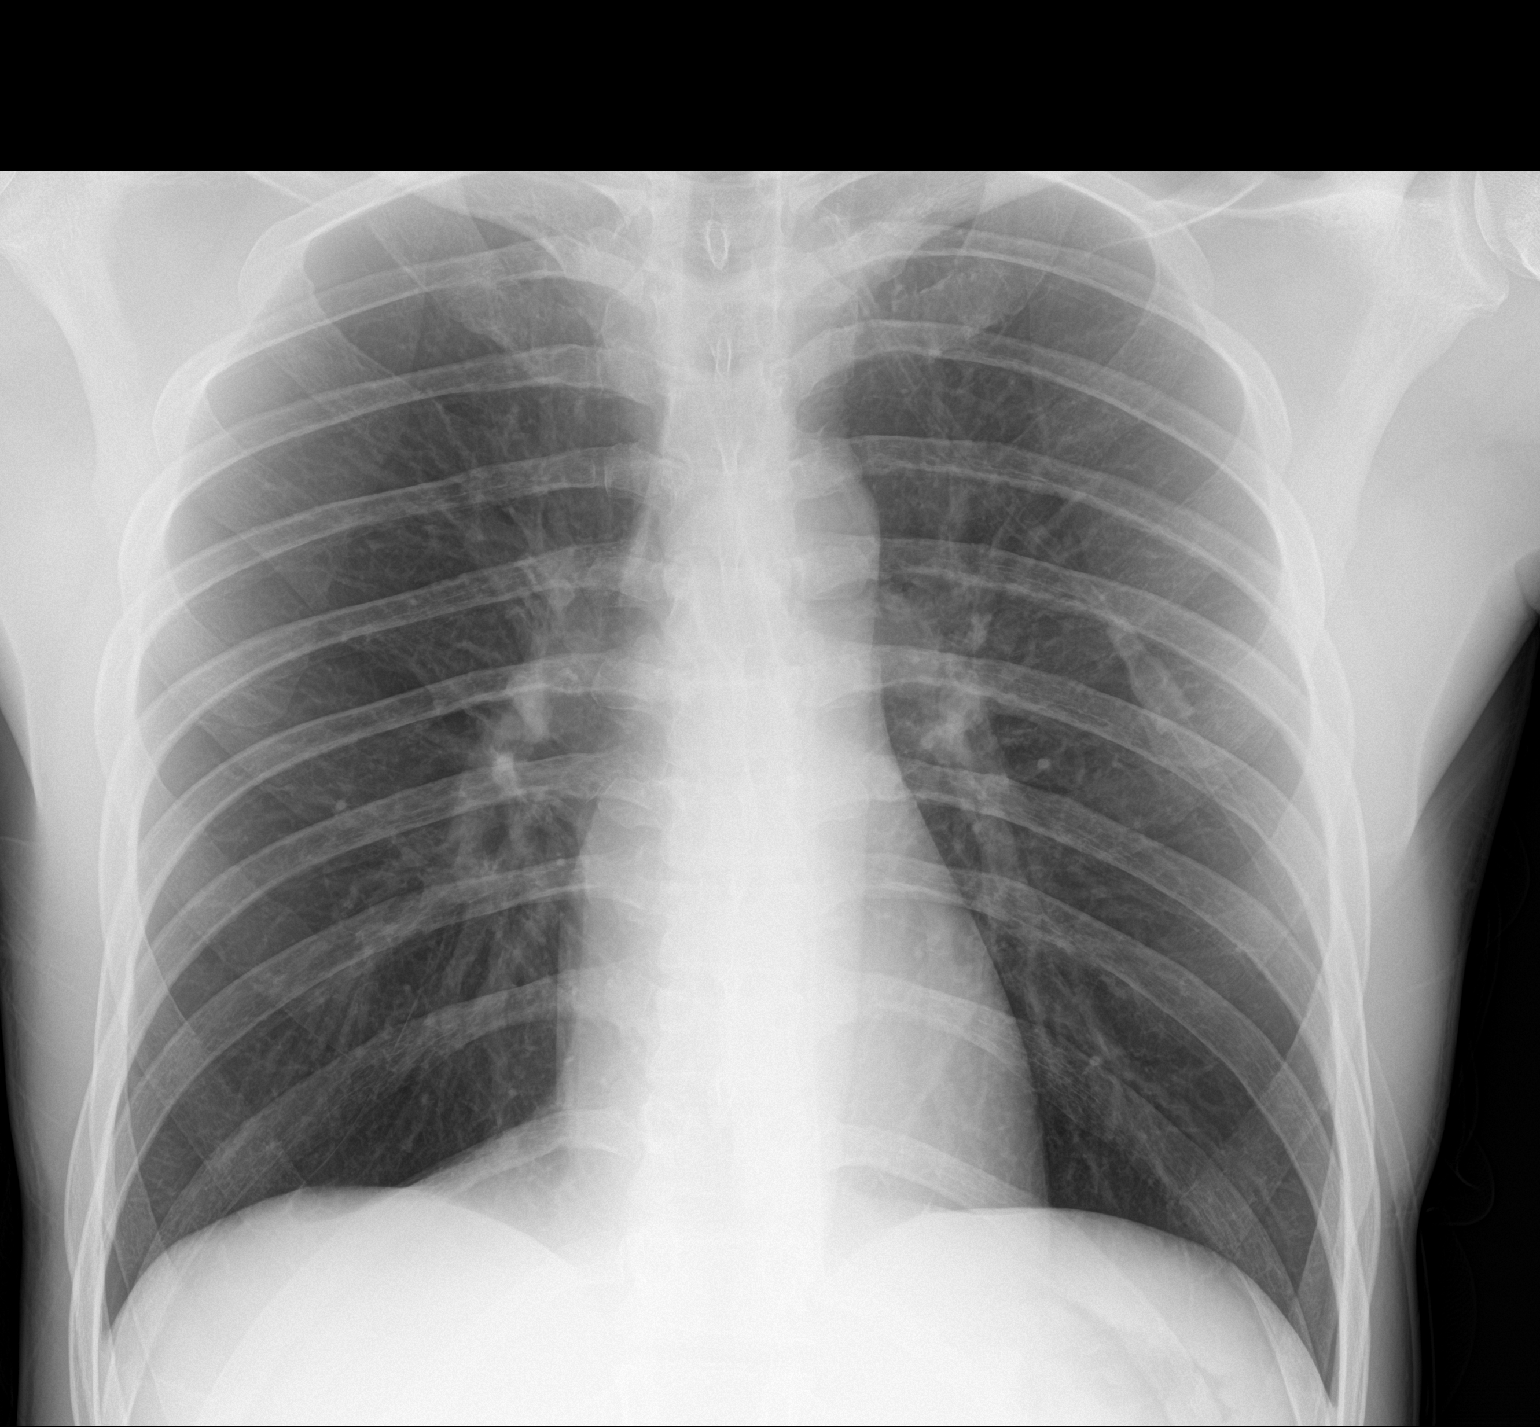

[1 of 1 positions shown; findings below may reference images not displayed]

FINDINGS: Hyperinflation. Normal heart size and pulmonary vascularity. No
focal airspace disease or consolidation in the lungs. No blunting of
costophrenic angles. No pneumothorax. Mediastinal contours appear
intact.
IMPRESSION: No active disease.

## 2022-07-27 ENCOUNTER — Encounter (HOSPITAL_COMMUNITY): Payer: Self-pay

## 2022-07-27 ENCOUNTER — Emergency Department (HOSPITAL_COMMUNITY)
Admission: EM | Admit: 2022-07-27 | Discharge: 2022-07-28 | Disposition: A | Discharge status: Home / Self Care | Payer: Medicaid Other | Attending: Emergency Medicine | Admitting: Emergency Medicine

## 2022-07-27 ENCOUNTER — Other Ambulatory Visit: Payer: Self-pay

## 2022-07-27 DIAGNOSIS — Z7289 Other problems related to lifestyle: Secondary | ICD-10-CM | POA: Insufficient documentation

## 2022-07-27 DIAGNOSIS — F419 Anxiety disorder, unspecified: Secondary | ICD-10-CM

## 2022-07-27 DIAGNOSIS — Z765 Malingerer [conscious simulation]: Secondary | ICD-10-CM

## 2022-07-27 NOTE — ED Triage Notes (Addendum)
Pt bib ems c/o sob after running. Pt states he is "seeing things" but doesn't elaborate on what he is seeing. Denies AH, SI and HI. Pt requests "chill pill"   72 hr 16 rr 98% room air

## 2022-07-27 NOTE — ED Provider Triage Note (Addendum)
Emergency Medicine Provider Triage Evaluation Note  [name] , a 31 y.o. male  was evaluated in triage.  Pt complains of hallucinations.  States that he started seeing things a couple hours ago.  Unable to remember what the images were.  Did feel like at 1 point he was being chased.  Says he is no longer seeing any thing.  States he needs "a chill pill".  Denies shortness of breath chest pain.  Denies fever.  Denies substance abuse and alcohol consumption.  Denies SI and HI.  History of schizophrenia  Review of Systems  Positive: See above Negative: See above  Physical Exam  BP (!) 140/76 (BP Location: Right Arm)   Pulse 77   Temp 98.4 F (36.9 C) (Oral)   Resp 16   Ht 5\' 5"  (1.651 m)   Wt 72.5 kg   SpO2 95%   BMI 26.60 kg/m  Gen:   Awake, no distress   Resp:  Normal effort  MSK:   Moves extremities without difficulty  Other:    Medical Decision Making  Medically screening exam initiated at 8:53 PM.  Appropriate orders placed.  [name] was informed that the remainder of the evaluation will be completed by another provider, this initial triage assessment does not replace that evaluation, and the importance of remaining in the ED until their evaluation is complete.  No orders at this time   Lindell Spar 07/27/22 2055    Harriet Pho, PA-C 07/27/22 2055

## 2022-07-28 ENCOUNTER — Encounter (HOSPITAL_COMMUNITY): Payer: Self-pay | Admitting: Psychiatry

## 2022-07-28 ENCOUNTER — Ambulatory Visit (HOSPITAL_COMMUNITY)
Admission: EM | Admit: 2022-07-28 | Discharge: 2022-07-28 | Disposition: A | Discharge status: Home / Self Care | Payer: Medicaid Other | Attending: Psychiatry | Admitting: Psychiatry

## 2022-07-28 DIAGNOSIS — Z5900 Homelessness unspecified: Secondary | ICD-10-CM | POA: Insufficient documentation

## 2022-07-28 DIAGNOSIS — R450 Nervousness: Secondary | ICD-10-CM | POA: Insufficient documentation

## 2022-07-28 NOTE — Discharge Instructions (Addendum)
You should only use the emergency department for emergency complaints such as thoughts of suicide, homicide, active hallucinations, if you are concerned about feeling anxious better not having any of these emergent symptoms I recommend that you follow-up with your psychiatrist, or report to the behavioral health urgent care or the [facility] behavioral health center as needed

## 2022-07-28 NOTE — Discharge Instructions (Addendum)
Patient is instructed prior to discharge to:  Take all medications as prescribed by his/her mental healthcare provider. Report any adverse effects and or reactions from the medicines to his/her outpatient provider promptly. Keep all scheduled appointments, to ensure that you are getting refills on time and to avoid any interruption in your medication.  If you are unable to keep an appointment call to reschedule.  Be sure to follow-up with resources and follow-up appointments provided.  Patient has been instructed & cautioned: To not engage in alcohol and or illegal drug use while on prescription medicines. In the event of worsening symptoms, patient is instructed to call the crisis hotline, 911 and or go to the nearest ED for appropriate evaluation and treatment of symptoms. To follow-up with his/her primary care provider for your other medical issues, concerns and or health care needs.      Based on what you have shared, a list of resources for outpatient therapy and psychiatry is provided below to get you started back on treatment.  It is imperative that you follow through with treatment within 5-7 days from the day of discharge to prevent any further risk to your safety or mental well-being.  You are not limited to the list provided.  In case of an urgent crisis, you may contact the Mobile Crisis Unit with [facility] at [phone].        Outpatient Services for Therapy and Medication Management for [facility] [address] [place], [place], [address] [phone] phone  New Patient Assessment/Therapy Walk-ins Monday and Wednesday: 8am until slots are full. Every 1st and 2nd Friday: 1pm - 5pm  NO ASSESSMENT/THERAPY WALK-INS ON TUESDAYS OR THURSDAYS  New Patient Psychiatry/Medication Management Walk-ins Monday-Friday: 8am-11am  For all walk-ins, we ask that you arrive by 7:30am because patient will be seen in the order of arrival.   Availability is limited; therefore, you may not be seen on the same day that you walk-in.  Our goal is to serve and meet the needs of our community to the best of our ability.   [facility] [address] [place], [place], [address] [phone] phone  [facility] [address] [place], [place], [address] [address] phone ([address] [employer] [address] - [place], [address], [place], [place], [employer], [address], BCBS Healthy [place], [place], [address], [place], [place], [place], [place], [employer], [employer], [employer], [place])  [facility] [address] [place], [place], [address] [phone] phone  [facility]   [address] [place], [place]  [address] [phone]  [facility] [address] [place], [place], [address] [phone] phone  [facility] [address] [place], [place], [address] [phone] phone  [facility] [address] [place], [address] [phone] phone  [facility] [place] [address], [place], [address] [phone] phone  [facility] [address] [place], [place], [address] [phone] phone [phone] fax  [facility] [address] [place], [place], [address] [phone] phone [phone] fax  [facility] [id number] [place], [place], [address] [phone] phone  Jovita Kussmaul [address] [place], [place], [address]  [phone] phone  [facility]  (Adults Only) [address] [place], [place], [address]  [phone] phone [phone] fax  For your behavioral health needs, you are advised to follow  up with an outpatient provider.  You may be eligible for ACT Team services, which would include more frequent visits with your provider, as well as in-home services.   The following providers offer ACT Team services.  Contact them at your earliest opportunity to ask about enrolling in their program:       [facility]      [address], [place], [place] [address]      [phone] [place]      [place], [place] [address]      [phone]       [facility] [place] [place], [place] [address]      [phone]       [facility]      [address] [place]      [address]      [place], [place]  [address]      [phone]       [facility]      [address]      [place], [place] [address]      [phone]    [place] [address] [place], [place]  [address] [phone] Population served: Male veterans 18+ with substance abuse issues Eligibility: By referral only  [place] [address] [place], [place] [address] [phone] Population served: Adult men & women (35 years old and older, able to perform activities for daily living) Documents required: Valid ID & [place] [address] [place], [place]  [address] [phone] Population served: Families with children  [facility] [address] [place], [place] [address] [phone] Population served: Males 18+ Documents required: Valid ID & Social Security [facility] [place], [place], [place] [address] [phone] Population Served: Families with children, adult women, and adult men.  [facility] [address], [place], [place] [address] [phone] Population served: Men 18+, preference for disabled and/or veterans Eligibility: By referral only  [facility] [address]) - Emergency Family  Shelter [place], [place], [place] [address] [phone] or [phone] Population served: Families with children.

## 2022-07-28 NOTE — ED Provider Notes (Signed)
[place] [facility] DEPT Provider Note   CSN: [id number] Arrival date & time: 07/27/22  1931     History  Chief Complaint  Patient presents with   Hallucinations    [name] is a 31 y.o. male with past medical history significant for substance-induced disorder, cannabis use disorder, schizophrenia, alcohol abuse, malingering, homelessness who presents initially many hours ago at around 8 PM last night complaining of "seeing things".  On my evaluation patient denies any hallucinations both auditory or visual.  He is requesting "chill pill".  When pressed for more information patient reports that he wants "a Xanax".  Patient cannot tell me why he needs the Xanax.  He adamantly denies SI, HI, AVH, he cannot tell me any other reason that he is in the emergency department other than for the Xanax.  Patient then requesting [place]  HPI     Home Medications Prior to Admission medications   Medication Sig Start Date End Date Taking? Authorizing Provider  hydrOXYzine (ATARAX) 25 MG tablet Take 1 tablet (25 mg total) by mouth every 6 (six) hours. 02/24/22   Harris, Vernie Shanks, PA-C  paliperidone (INVEGA) 6 MG 24 hr tablet Take 1 tablet (6 mg total) by mouth daily for 14 days. Patient not taking: No sig reported 01/02/21 02/22/21  Garald Balding, PA-C      Allergies    Shellfish allergy and Bee pollen    Review of Systems   Review of Systems  All other systems reviewed and are negative.   Physical Exam Updated Vital Signs BP 138/78 (BP Location: Right Arm)   Pulse 82   Temp 98.4 F (36.9 C) (Oral)   Resp 18   Ht 5\' 5"  (1.651 m)   Wt 72.5 kg   SpO2 100%   BMI 26.60 kg/m  Physical Exam Vitals and nursing note reviewed.  Constitutional:      General: He is not in acute distress.    Appearance: Normal appearance.  HENT:     Head: Normocephalic and atraumatic.  Eyes:     General:        Right eye: No discharge.         Left eye: No discharge.  Cardiovascular:     Rate and Rhythm: Normal rate and regular rhythm.  Pulmonary:     Effort: Pulmonary effort is normal. No respiratory distress.  Musculoskeletal:        General: No deformity.  Skin:    General: Skin is warm and dry.  Neurological:     Mental Status: He is alert and oriented to person, place, and time.  Psychiatric:        Mood and Affect: Mood normal.        Behavior: Behavior normal.     Comments: Patient with anxious, erratic behavior but can answer questions appropriately, does not seem to be responding to internal stimuli, and follows directions appropriately.  He denies SI, HI, AVH.  No signs of recent self-harm noted.     ED Results / Procedures / Treatments   Labs (all labs ordered are listed, but only abnormal results are displayed) Labs Reviewed - No data to display  EKG None  Radiology No results found.  Procedures Procedures    Medications Ordered in ED Medications - No data to display  ED Course/ Medical Decision Making/ A&P  Medical Decision Making  This is a somewhat disheveled appearing 31 year old male with a past medical history significant for schizophrenia, homelessness, malingering, who presents requesting "a chill pill" and then "Xanax".  On my evaluation shows some signs of poorly controlled schizophrenia but does not seem to be responding to significant paranoid delusions at this time, denies SI, HI, AVH.  Discussed with patient that I do not feel comfortable giving him a Xanax without confirmed diagnosis, or evidence of panic attack, and I am worried about drug-seeking behavior in this patient.  Patient understands this, reports that he does not have any other medical concerns at this time.  Patient attempted to call his and, and then when he was unable to reach her he walked out of the emergency department.  Patient was pending disposition at this time, encouraged to follow-up  with behavioral health outpatient resources. Final Clinical Impression(s) / ED Diagnoses Final diagnoses:  Anxiety  Drug-seeking behavior    Rx / DC Orders ED Discharge Orders     None         Anselmo Pickler, PA-C 07/28/22 0740    Lajean Saver, MD 07/28/22 [phone]

## 2022-07-28 NOTE — ED Notes (Signed)
Pt seen leaving the ED at this time

## 2022-07-28 NOTE — ED Notes (Signed)
Attempted to call pt's aunt twice per pt request to see if she would give him a ride. No answer. Pt took off wristband and left department. Gait steady, no distress noted.

## 2022-07-28 NOTE — ED Provider Notes (Signed)
Behavioral Health Urgent Care Medical Screening Exam  Patient Name: [name] MRN: [id number] Date of Evaluation: 07/28/22 Chief Complaint:   Diagnosis:  Final diagnoses:  Homelessness    History of Present illness: [name] is a 31 y.o. male. Patient [facility] voluntarily as a walk-in accompanied by  police. Patient reports that "I don't know why I am here".    On approach, the patient is alert and oriented x 4. Patient answers questions appropriately. Patient thought process is logical, speech coherent and tone is normal. Patient is causally dressed. Patient denies suicidal thoughts. Patient denies self harm behaviors. Patient denies prior suicide attempt. Patient denies homicidal ideations. Patient denies AVH. Patient does not appear to be responding to internal, or external stimuli. Patient denies current alcohol/drug use. Per chart review, patient has been diagnosed with Schizophrenia, depression and Substance use disorders. He currently reports no symptoms and has not been taking any medications.  He admits that he is homeless and has no family around. His Vital signs are stable and he has no major signs of distress.   Patient  reports that "I just need to go ". He  has no current outpatient services and refuses to talk about it.  He reports that he is currently homeless "but I have somewhere to go". He reports that he currently does not take any medications. Patient was offered support and encouragements.    Psychiatric Specialty Exam  Presentation  General Appearance:Disheveled  Eye Contact:Poor  Speech:Clear and Coherent  Speech Volume:Normal  Handedness:Right   Mood and Affect  Mood: Euphoric  Affect: Congruent   Thought Process  Thought Processes: Coherent  Descriptions of Associations:Circumstantial  Orientation:Full (Time, Place and Person)  Thought Content:WDL   Diagnosis of Schizophrenia or Schizoaffective disorder in past: Yes  Duration of Psychotic Symptoms: Greater than six months  Hallucinations:None denies AVH  Ideas of Reference:None  Suicidal Thoughts:No  Homicidal Thoughts:No   Sensorium  Memory: Recent Fair; Immediate Fair; Remote Fair  Judgment: Fair  Insight: Fair   Art therapist  Concentration: Fair  Attention Span: Fair  Recall: [employer] of Knowledge: Fair  Language: Fair   Psychomotor Activity  Psychomotor Activity: Normal   Assets  Assets: Manufacturing systems engineer; Physical Health   Sleep  Sleep: Fair  Number of hours:  0 ("i sleep good")   No data recorded  Physical Exam: Physical Exam Vitals and nursing note reviewed.  Constitutional:      General: He is not in acute distress.    Appearance: He is well-developed and normal weight.  HENT:     Head: Normocephalic.     Right Ear: Tympanic membrane normal.     Left Ear: Tympanic membrane normal.     Mouth/Throat:     Mouth: Mucous membranes are moist.  Eyes:     Conjunctiva/sclera: Conjunctivae normal.  Cardiovascular:     Rate and Rhythm: Normal rate.     Heart sounds: No murmur heard. Pulmonary:     Effort: No respiratory distress.  Abdominal:     Tenderness: There is no abdominal tenderness.  Musculoskeletal:        General: No swelling.     Cervical back: Normal range of motion.  Neurological:     Mental Status: He is alert.  Psychiatric:        Attention and Perception: Attention and perception normal.        Mood and Affect: Mood is anxious.  Speech: Speech normal.        Behavior: Behavior is hyperactive.        Thought Content: Thought content normal.        Cognition and Memory: Cognition normal.        Judgment: Judgment normal.    Review of Systems  Constitutional: Negative.   HENT: Negative.    Eyes: Negative.   Respiratory: Negative.    Cardiovascular: Negative.   Gastrointestinal:  Negative.   Genitourinary: Negative.   Musculoskeletal: Negative.   Skin: Negative.   Neurological: Negative.   Endo/Heme/Allergies: Negative.   Psychiatric/Behavioral:  The patient is nervous/anxious.    Blood pressure 123/79, pulse 81, temperature 98.5 F (36.9 C), temperature source Oral, resp. rate 20, SpO2 99 %. There is no height or weight on file to calculate BMI.  Musculoskeletal: Strength & Muscle Tone: within normal limits Gait & Station: normal Patient leans: Right   [facility] [place] [facility] Discharge Disposition for Follow up and Recommendations: Based on my evaluation the patient does not appear to have an emergency medical condition and can be discharged with resources and follow up care in outpatient services for Medication Management, Individual Therapy, and Group Therapy Recommended to follow up with  [facility] behavioral health Outpatient services.   Ronelle Nigh, NP 07/28/2022, 9:57 AM

## 2022-07-28 NOTE — Progress Notes (Signed)
   07/28/22 0933  [facility] Triage Screening (Walk-ins at [facility] only)  How Did You Hear About [place]? Legal System  What Is the Reason for Your Visit/Call Today? Pt is a 31 yo male who presented brought in by PD after [employer] [place] [employer] called them. Pt stated he did not know why they called the PD. Pt denied SI, HI, NSSH, AVH, paranoia and any substance use in the last 24 hours.  How Long Has This Been Causing You Problems? > than 6 months  Have You Recently Had Any Thoughts About Hurting Yourself? No  Are You Planning to Commit Suicide/Harm Yourself At This time? No  Have you Recently Had Thoughts About Calcium? No  Are You Planning To Harm Someone At This Time? No  Are you currently experiencing any auditory, visual or other hallucinations? No  Have You Used Any Alcohol or Drugs in the Past 24 Hours? No  Do you have any current medical co-morbidities that require immediate attention? No  Clinician description of patient physical appearance/behavior: Pt was calm, cooperative, answered all questions appropriately and was eating crackers and drinking juice during the assessment.  What Do You Feel Would Help You the Most Today? Social Support  Determination of Need Routine (7 days)   Halynn Reitano T. Mare Ferrari, [place], [facility], [facility] Triage Specialist [facility]

## 2022-07-28 NOTE — Progress Notes (Signed)
LCSW Progress Note   Per Ronelle Nigh, NP, this pt does not require psychiatric hospitalization at this time.  Pt is psychiatrically cleared.  Discharge instructions include several resources for shelters, ACTT, and outpatient services such as therapy and medication management if needed.  EDP Lawson Radar, NP, has been notified.  Omelia Blackwater, MSW, [place] [phone] or [phone]

## 2022-07-29 ENCOUNTER — Other Ambulatory Visit: Payer: Self-pay

## 2022-07-29 ENCOUNTER — Emergency Department (HOSPITAL_COMMUNITY)
Admission: EM | Admit: 2022-07-29 | Discharge: 2022-07-30 | Disposition: A | Discharge status: Home / Self Care | Payer: Medicaid Other | Attending: Emergency Medicine | Admitting: Emergency Medicine

## 2022-07-29 ENCOUNTER — Encounter (HOSPITAL_COMMUNITY): Payer: Self-pay

## 2022-07-29 DIAGNOSIS — H5310 Unspecified subjective visual disturbances: Secondary | ICD-10-CM | POA: Diagnosis present

## 2022-07-29 DIAGNOSIS — Z5900 Homelessness unspecified: Secondary | ICD-10-CM | POA: Diagnosis not present

## 2022-07-29 DIAGNOSIS — M545 Low back pain, unspecified: Secondary | ICD-10-CM | POA: Insufficient documentation

## 2022-07-29 DIAGNOSIS — M549 Dorsalgia, unspecified: Secondary | ICD-10-CM | POA: Diagnosis not present

## 2022-07-29 NOTE — ED Provider Triage Note (Signed)
Emergency Medicine Provider Triage Evaluation Note  [name] , a 31 y.o. male  was evaluated in triage.  Pt complains of shortness of breath. States this has been going on for several months. Cannot specify what shortness of breath means to him other than he "needs an oxygen mask". Attempted several times to get him to clarify but he continues to say "I don't know". Was initially laying on the triage floor with the lights out. When asked why he was laying on the floor he said he "couldn't help it". Denies history of breathing problems. Denies any other symptoms.   Hx substance-induced disorder, cannabis use disorder, schizophrenia, alcohol abuse, malingering, homelessnesssubstance-induced disorder, cannabis use disorder, schizophrenia, alcohol abuse, malingering, homelessness. Last seen in the ER on 10/19  Review of Systems  Positive: SOB Negative: Fever, cough, congestion, chest pain, wheezing, leg pain or swelling  Physical Exam  BP 124/70 (BP Location: Left Arm)   Pulse 83   Temp 98.6 F (37 C) (Oral)   Resp 16   Ht 5\' 5"  (1.651 m)   Wt 72.5 kg   SpO2 100%   BMI 26.60 kg/m  Gen:   Awake, no distress   Resp:  Normal effort  MSK:   Moves extremities without difficulty  Other:  Laying on triage room floor, no increased work of breathing, speaking quietly  Medical Decision Making  Medically screening exam initiated at 4:59 PM.  Appropriate orders placed.  [name] was informed that the remainder of the evaluation will be completed by another provider, this initial triage assessment does not replace that evaluation, and the importance of remaining in the ED until their evaluation is complete.     Roben Tatsch T, PA-C 07/29/22 1701

## 2022-07-29 NOTE — ED Triage Notes (Signed)
Per EMS- patient reports SOB and bronchitis x 6 months. Patient demanded that EMS put oxygen on him and once they did he wanted a NRB. EMS refused. Patient was angry. O2 Sats 98% on room air.

## 2022-07-30 ENCOUNTER — Emergency Department (HOSPITAL_COMMUNITY)
Admission: EM | Admit: 2022-07-30 | Discharge: 2022-07-30 | Disposition: A | Discharge status: Home / Self Care | Payer: Medicaid Other | Source: Home / Self Care | Attending: Emergency Medicine | Admitting: Emergency Medicine

## 2022-07-30 ENCOUNTER — Other Ambulatory Visit: Payer: Self-pay

## 2022-07-30 ENCOUNTER — Encounter (HOSPITAL_COMMUNITY): Payer: Self-pay | Admitting: Emergency Medicine

## 2022-07-30 DIAGNOSIS — M545 Low back pain, unspecified: Secondary | ICD-10-CM | POA: Insufficient documentation

## 2022-07-30 DIAGNOSIS — G8929 Other chronic pain: Secondary | ICD-10-CM

## 2022-07-30 MED ORDER — NAPROXEN 250 MG PO TABS
500.0000 mg | ORAL_TABLET | Freq: Once | ORAL | Status: AC
  Administered 2022-07-30: 500 mg via ORAL
  Filled 2022-07-30: qty 2

## 2022-07-30 NOTE — ED Provider Notes (Signed)
[name] EMERGENCY DEPARTMENT Provider Note   CSN: [id number] Arrival date & time: 07/30/22  1731     History  No chief complaint on file.   [name] is a 31 y.o. male.  HPI     31 year old male comes in with chief complaint of back pain.  He was at a [employer] when he started complaining of back pain.  Patient indicates that he has been having back pain since he injured it when he threw himself through a window.  He does not know when he threw himself through the window.  He does not know what he takes for pain when he has painful episodes.  Of note, patient has been seen at multiple emergency rooms at least 5 times in the last 5 days, with different complaints.  Patient is requesting food and ginger ale.  Home Medications Prior to Admission medications   Medication Sig Start Date End Date Taking? Authorizing Provider  hydrOXYzine (ATARAX) 25 MG tablet Take 1 tablet (25 mg total) by mouth every 6 (six) hours. Patient not taking: Reported on 07/30/2022 02/24/22   Arthor Captain, PA-C  paliperidone (INVEGA) 6 MG 24 hr tablet Take 1 tablet (6 mg total) by mouth daily for 14 days. Patient not taking: No sig reported 01/02/21 02/22/21  Mare Ferrari, PA-C      Allergies    Shellfish allergy and Bee pollen    Review of Systems   Review of Systems  Physical Exam Updated Vital Signs BP 117/71 (BP Location: Right Arm)   Pulse 84   Temp 98.4 F (36.9 C) (Oral)   Resp 17   SpO2 97%  Physical Exam Vitals and nursing note reviewed.  Constitutional:      Appearance: He is well-developed.  HENT:     Head: Atraumatic.  Cardiovascular:     Rate and Rhythm: Normal rate.  Pulmonary:     Effort: Pulmonary effort is normal.  Musculoskeletal:     Cervical back: Neck supple.     Comments: Patient has no midline T or L-spine tenderness.  He is having tenderness over the right paraspinal region.  No gross deformity, no signs of infection.  Skin:     General: Skin is warm.  Neurological:     Mental Status: He is alert and oriented to person, place, and time.     ED Results / Procedures / Treatments   Labs (all labs ordered are listed, but only abnormal results are displayed) Labs Reviewed - No data to display  EKG None  Radiology No results found.  Procedures Procedures    Medications Ordered in ED Medications  naproxen (NAPROSYN) tablet 500 mg (has no administration in time range)    ED Course/ Medical Decision Making/ A&P                           Medical Decision Making 31 year old patient with history of schizophrenia comes in with chief complaint of back pain.  When asked where it hurts, is pointing towards the right paraspinal region.  He indicates that he has been having pain since he threw himself out of the window.  He is unsure when he threw himself out of the window and what he takes for pain.  He has been seen multiple times in different ERs over the last few days for different complaints.  Patient has already ambulated.  He has no numbness/tingling.  He has no midline spine  tenderness that warrants any imaging and right now.  He is requesting food.  We will give him Naprosyn for pain and food at the time of discharge.  Problems Addressed: Chronic right-sided low back pain without sciatica: chronic illness or injury  Risk Prescription drug management.    Final Clinical Impression(s) / ED Diagnoses Final diagnoses:  Chronic right-sided low back pain without sciatica    Rx / DC Orders ED Discharge Orders     None         Varney Biles, MD 07/30/22 2002

## 2022-07-30 NOTE — ED Triage Notes (Signed)
Pt here from a local a Art therapist  with c/o low back pain , pt has been seen at [facility]  for the last 3 days

## 2022-07-30 NOTE — Discharge Instructions (Addendum)
You are seen in the ER for back pain.  Our records indicate that you had been seen in different ERs at least 5 times over the last 3 days.  Please do not misuse emergency room.  Use the resources from below for appropriate care.  Substance Abuse Treatment Programs  Intensive Outpatient Programs [facility]     [address], [place]       [facility] [place] [address] [place], [place]  [facility]     (Inpatient and outpatient)     [address]           [place] [phone] (Suboxone and Methadone)  [place], [place] [address]      [place] [address] [place], [place]  [facility] (Outpatient/Inpatient, Chemical)    (insurance only) [phone]             [facility] ([place]) [place], [place]     [facility]     [address]     [place], [place]       [facility] (for caregivers and family) [phone] [address] Kristeen Mans. [place], [place]      Residential Treatment Programs [facility]      [address], [place], [place] [address]  [phone]       [facility] [address] [place], [place] [address] [phone]  [facility] [place]        [phone]       [facility] [phone]  [facility] ([place]             [place], [name] or [place] of [place], [address] [phone]  [facility]    [address]      [place], [place]       The [facility] [address] [place],  [place]  [place]   [address] [place], [place] [address]     [phone]      Admissions: 8am-3pm M-F  [facility] (RTS) [address] [place], [place]  BATS Program: Residential Program [phone] Days)   [place], [place] or [phone]     ADATC: [facility] [place], [place] (Walk in Hours over the weekend or  by referral)  [facility] [address] [place], [place], [place] [address] [phone]  Crisis Mobile: [facility]:  [phone] (for crisis response 24 hours a day) [facility] Hotline:      [phone] Outpatient Psychiatry and Counseling  [facility]: Mobile Crisis Management 24 hours:  [phone]  [facility] [employer] sliding scale fee and walk in schedule: M-F 8am-12pm/1pm-3pm [address]  [place], [place] [address] [phone]  [facility] [address] [place], [place] [address] [phone]  [facility] (Formerly known as [facility] [employer])- new patient walk-in appointments available Monday - Friday 8am -3pm.          [address] [place], [place] [address] [phone] or crisis line- [phone]  [facility] Services/ Intensive Outpatient Therapy Program [address] [place], [place] [address] [phone]  [facility]                  Crisis Services      [phone]     [place], [place] [address]                 [facility]   [facility] [phone]. [address] [place], [place] [address]   [facility]          [address]  [place], [place] [address]       [phone]  [facility] [address] [place], [place] [address] [phone]  [facility]    [address]    [place], [place] [address]     [phone]       Andee Poles, MD     [name]     [place] [address]     [phone]       [facility] [address] [place] [address]  [facility]     [address] [place], [place]      [phone]       [facility] Eulogio Ditch, MD [address] [place], [place] [address] [phone]  [facility]     [address]     [place], [place] [address]     [phone]       [facility] [address] [place], [place] [address] [phone] Resources for Temporary Residential Assistance/Crisis Centers  [facility]) M-F 8am-3pm   [address] [place], [place] [address]   [phone] Services include: laundry, barbering, support groups, case management, phone  & computer access, showers, AA/NA mtgs, mental health/substance abuse nurse, job skills class, disability information, VA assistance, spiritual classes, etc.   HOMELESS SHELTERS  [facility]     [facility]   [address] [place]     [phone]              [employer] (women and children)       [address] [place], [place] [address] [phone] [email or web address] for application and process Application Required  [facility]   [address]    [place] [address]     [phone]  [employer] [employer] [facility] [address] [place], [place] [address] [phone] application appt.) Application Required  [facility] (women only)    [address]     [place], [place] [address]     [phone]      Intake starts 6pm daily Need valid ID, SSC, & Police report [employer] [address] [place], [place] [phone] Application  Required  [employer] (men only)     [address]      [place], [place]     [phone]       [facility] [place] (Pregnant women only) [address] [place], [place] [phone]  [facility]      [address]      [place], [place] [address]     [phone]             [facility] [address] [place], [place] [phone] 90 day commitment/SA/Application process  [facility](men only)     [address]     [place], [place]     [phone]       Check-in at [facility] [address] [place], [place] [address] [phone] Men/Women/Women and Children must be there by 7 pm  [facility] [place], [place] [phone]

## 2022-07-30 NOTE — ED Provider Notes (Signed)
[facility] Emergency Department Provider Note MRN:  [id number]  Arrival date & time: 07/30/22     Chief Complaint   Shortness of Breath   History of Present Illness   [name] is a 31 y.o. year-old male presents to the ED with chief complaint of trouble seeing things for an unknown period of time.  He refuses or is not able to specify during my exam.  This is different complaint from what he had originally presented with in triage of shortness of breath.  He has extensive psychiatric history.  He is also homeless.  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  He was seen by psychiatry 2 days ago and was cleared for outpatient follow-up.  History provided by patient.   Review of Systems  Pertinent positive and negative review of systems noted in HPI.    Physical Exam   Vitals:   07/30/22 0140 07/30/22 0526  BP: (!) 118/96   Pulse: (!) 105   Resp: 18   Temp: 97.7 F (36.5 C) 98 F (36.7 C)  SpO2: 99%     CONSTITUTIONAL:  non toxic-appearing, NAD NEURO:  Alert and oriented x 3, CN 3-12 grossly intact EYES:  eyes equal and reactive ENT/NECK:  Supple, no stridor  CARDIO:  normal rate on my exam, regular rhythm, appears well-perfused  PULM:  No respiratory distress, CTAB GI/GU:  non-distended,  MSK/SPINE:  No gross deformities, no edema, moves all extremities  SKIN:  no rash, atraumatic   *Additional and/or pertinent findings included in MDM below  Diagnostic and Interventional Summary     Labs Reviewed - No data to display  No orders to display    Medications - No data to display   Procedures  /  Critical Care Procedures  ED Course and Medical Decision Making  I have reviewed the triage vital signs, the nursing notes, and pertinent available records from the EMR.  Social Determinants Affecting Complexity of Care: Patient is homelessness. Resources provided for homeless shelters.  ED  Course:    Medical Decision Making Patient here complaining of trouble seeing things for unknown period of time.  His vision does seem to be grossly intact and he correctly tracks and counts fingers.  He is able to ambulate without any difficulty.  Patient complained of shortness of breath in triage.  He does not seem short of breath, and has no signs of respiratory distress.  He does not have any wheezing, rhonchi, or rales.  He is not tachypneic nor hypoxic.  He is resting comfortably on the stretcher.  I do not see any evidence of trauma.  I am suspicious of malingering due to the patient's housing instability.  I do not think that the patient requires emergent work-up or evaluation.  I think that he appears stable for discharge and outpatient follow-up.  I agree with the recent psychiatry note stating that patient seems appropriate for outpatient psychiatric follow-up.     Consultants: No consultations were needed in caring for this patient.   Treatment and Plan: Emergency department workup does not suggest an emergent condition requiring admission or immediate intervention beyond  what has been performed at this time. The patient is safe for discharge and has  been instructed to return immediately for worsening symptoms, change in  symptoms or any other concerns    Final Clinical Impressions(s) / ED Diagnoses     ICD-10-CM   1. Subjective visual  disturbance  H53.10       ED Discharge Orders     None         Discharge Instructions Discussed with and Provided to Patient:   Discharge Instructions   None      Montine Circle, PA-C 0000000 [id number]    Delora Fuel, MD 0000000 [phone]

## 2022-07-30 NOTE — ED Notes (Signed)
All discharge instructions reviewed with patient including follow up care. Patient verbalized understanding of same and had no other questions. Patient stable and ambulatory at time of discharge.  

## 2022-07-31 ENCOUNTER — Encounter (HOSPITAL_COMMUNITY): Payer: Self-pay | Admitting: *Deleted

## 2022-07-31 ENCOUNTER — Emergency Department (HOSPITAL_COMMUNITY)
Admission: EM | Admit: 2022-07-31 | Discharge: 2022-07-31 | Discharge status: Against Medical Advice | Payer: Medicaid Other | Attending: Emergency Medicine | Admitting: Emergency Medicine

## 2022-07-31 ENCOUNTER — Other Ambulatory Visit: Payer: Self-pay

## 2022-07-31 ENCOUNTER — Ambulatory Visit (HOSPITAL_COMMUNITY)
Admission: EM | Admit: 2022-07-31 | Discharge: 2022-07-31 | Disposition: A | Discharge status: Home / Self Care | Payer: Medicaid Other | Attending: Urology | Admitting: Urology

## 2022-07-31 DIAGNOSIS — Z5321 Procedure and treatment not carried out due to patient leaving prior to being seen by health care provider: Secondary | ICD-10-CM | POA: Diagnosis not present

## 2022-07-31 DIAGNOSIS — F200 Paranoid schizophrenia: Secondary | ICD-10-CM | POA: Insufficient documentation

## 2022-07-31 DIAGNOSIS — M549 Dorsalgia, unspecified: Secondary | ICD-10-CM | POA: Insufficient documentation

## 2022-07-31 DIAGNOSIS — F419 Anxiety disorder, unspecified: Secondary | ICD-10-CM | POA: Diagnosis present

## 2022-07-31 NOTE — ED Provider Notes (Addendum)
Behavioral Health Urgent Care Medical Screening Exam  Patient Name: [name] MRN: [id number] Date of Evaluation: 07/31/22 Chief Complaint:   Diagnosis:  Final diagnoses:  Paranoid schizophrenia (HCC)    History of Present illness: [name] is a 31 y.o. male with history of paranoid schizophrenia.  Patient was brought [facility] to [facility] by law enforcement for a walk-in assessment.  Patient was seen face-to-face and his chart was reviewed by this nurse petitioner.  Of note, he was discharged from [facility] less than 2 hours ago and he has had multiple evaluations for various complaint over the past several days.  On evaluation, patient is sleeping in assessment room. He easily awakes to verbal prompts. He is oriented. He denies any medical or psychiatric complaint. He says "I want to get some sleep, rest and eat." He denies suicidal ideation, hallucination, or substance use. He does not appear to be acutely psychotic. He appears to be at his baseline.     Psychiatric Specialty Exam  Presentation  General Appearance:Appropriate for Environment  Eye Contact:Minimal  Speech:Clear and Coherent  Speech Volume:Normal  Handedness:Right   Mood and Affect  Mood: Euthymic  Affect: Congruent   Thought Process  Thought Processes: Coherent  Descriptions of Associations:Intact  Orientation:Full (Time, Place and Person)  Thought Content:WDL  Diagnosis of Schizophrenia or Schizoaffective disorder in past: Yes  Duration of Psychotic Symptoms: Greater than six months  Hallucinations:None denies AVH  Ideas of Reference:None  Suicidal Thoughts:No  Homicidal Thoughts:No   Sensorium  Memory: Recent Fair; Immediate Fair; Remote Fair  Judgment: Fair  Insight: Poor   Executive Functions  Concentration: Fair  Attention Span: Fair  Recall: [employer] of Knowledge: Fair  Language: Fair   Psychomotor Activity  Psychomotor Activity: Normal   Assets   Assets: Physical Health; Desire for Improvement   Sleep  Sleep: -- (patient will not say)  Number of hours:  0 ("i sleep good")   No data recorded  Physical Exam: Physical Exam Vitals and nursing note reviewed.  Constitutional:      General: He is not in acute distress.    Appearance: He is well-developed. He is not ill-appearing, toxic-appearing or diaphoretic.  HENT:     Head: Normocephalic and atraumatic.  Eyes:     Conjunctiva/sclera: Conjunctivae normal.  Cardiovascular:     Rate and Rhythm: Normal rate.  Pulmonary:     Effort: Pulmonary effort is normal. No respiratory distress.     Breath sounds: Normal breath sounds.  Abdominal:     Palpations: Abdomen is soft.     Tenderness: There is no abdominal tenderness.  Musculoskeletal:        General: No swelling.     Cervical back: Neck supple.  Skin:    General: Skin is warm and dry.  Neurological:     Mental Status: He is alert and oriented to person, place, and time.  Psychiatric:        Attention and Perception: Attention and perception normal.        Mood and Affect: Mood normal.        Speech: Speech normal.        Behavior: Behavior normal. Behavior is cooperative.        Thought Content: Thought content normal. Thought content is not paranoid or delusional. Thought content does not include homicidal or suicidal ideation. Thought content does not include homicidal or suicidal plan.    Review of Systems  Constitutional: Negative.   HENT: Negative.  Eyes: Negative.   Respiratory: Negative.    Cardiovascular: Negative.   Gastrointestinal: Negative.   Genitourinary: Negative.   Musculoskeletal: Negative.   Skin: Negative.   Neurological: Negative.   Endo/Heme/Allergies: Negative.   Psychiatric/Behavioral: Negative.     Blood pressure 105/64, pulse 72, temperature 98.4 F (36.9 C), temperature source Oral, resp. rate 16, SpO2 98 %. There is no height or weight on file to calculate  BMI.  Musculoskeletal: Strength & Muscle Tone: within normal limits Gait & Station: normal Patient leans: Right   [place] MSE Discharge Disposition for Follow up and Recommendations: Based on my evaluation the patient does not appear to have an emergency medical condition and can be discharged with resources and follow up care in outpatient services for Medication Management and Individual Therapy   Ophelia Shoulder, NP 07/31/2022, 6:06 AM

## 2022-07-31 NOTE — ED Triage Notes (Signed)
Pt says that he is having back pain and a "nervous breakdown". He denies SI/HI/hallucinations.

## 2022-07-31 NOTE — Progress Notes (Signed)
   07/31/22 0435  [place] (Walk-ins at [facility] only)  How Did You Hear About [place]? Self  What Is the Reason for Your Visit/Call Today? Pt is a 31 year old single male who has a diagnosis of schizophrenia and a history of chronic homelessness. Pt's medical record indicates he has presented to emergency services daily for the past four days with various complaints and was at [facility] less than two hours ago . Tonight he says that "I don't want to talk" and "I'm good." He says that he wants to take a shower and to rest. He denies current suicidal ideation, homicidal ideation, or psychotic symptoms. He denies recent substance use.  How Long Has This Been Causing You Problems? > than 6 months  Have You Recently Had Any Thoughts About Hurting Yourself? No  Are You Planning to Commit Suicide/Harm Yourself At This time? No  Have you Recently Had Thoughts About [place]? No  Are You Planning To Harm Someone At This Time? No  Are you currently experiencing any auditory, visual or other hallucinations? No  Have You Used Any Alcohol or Drugs in the Past 24 Hours? No  Do you have any current medical co-morbidities that require immediate attention? No  Clinician description of patient physical appearance/behavior: Pt is disheveled, alert and oriented x4. Pt speaks in a mumbled tone, at moderate volume and normal pace. Motor behavior appears normal. Eye contact is good. Pt's mood is euthymic and affect is blunted. Thought process is coherent and relevant. There is no indication from his behavior that he is currently responding to internal stimuli. He follows direction but is uninterested in mental health treatment.  What Do You Feel Would Help You the Most Today? Housing Assistance;Food Assistance  If [facility] to [facility] was not available, would you have sought care in the Emergency Department? Yes  Determination of Need Routine (7 days)  Options For Referral Medication Management;Outpatient  Therapy

## 2022-09-05 ENCOUNTER — Ambulatory Visit (HOSPITAL_COMMUNITY)
Admission: EM | Admit: 2022-09-05 | Discharge: 2022-09-05 | Disposition: A | Discharge status: Home / Self Care | Payer: Medicaid Other | Attending: Psychiatry | Admitting: Psychiatry

## 2022-09-05 ENCOUNTER — Emergency Department (HOSPITAL_COMMUNITY)
Admission: EM | Admit: 2022-09-05 | Discharge: 2022-09-05 | Discharge status: Against Medical Advice | Payer: Medicaid Other | Attending: Emergency Medicine | Admitting: Emergency Medicine

## 2022-09-05 DIAGNOSIS — R443 Hallucinations, unspecified: Secondary | ICD-10-CM | POA: Diagnosis present

## 2022-09-05 DIAGNOSIS — Z5321 Procedure and treatment not carried out due to patient leaving prior to being seen by health care provider: Secondary | ICD-10-CM | POA: Diagnosis not present

## 2022-09-05 DIAGNOSIS — F209 Schizophrenia, unspecified: Secondary | ICD-10-CM | POA: Insufficient documentation

## 2022-09-05 DIAGNOSIS — Z765 Malingerer [conscious simulation]: Secondary | ICD-10-CM | POA: Insufficient documentation

## 2022-09-05 DIAGNOSIS — Z5900 Homelessness unspecified: Secondary | ICD-10-CM | POA: Insufficient documentation

## 2022-09-05 NOTE — ED Provider Notes (Signed)
Behavioral Health Urgent Care Medical Screening Exam  Patient Name: [name] MRN: [id number] Date of Evaluation: 09/05/22 Chief Complaint:   Diagnosis:  Final diagnoses:  Malingering  Homelessness    History of Present illness: [name] is a 31 y.o. male.  With a history of schizophrenia disorder, homelessness, and malingering presented [facility].  Per the patient he just needs somewhere to sleep for the night.  Writer did discuss with patient that the shelters are now open tonight operating on white flag warning patient was given the information and the address patient stated he leave whenever he is ready patient is currently laying in the day room.  Face-to-face observation of patient patient is alert and oriented x 4, speech is clear, maintaining eye contact.  Patient denies SI, HI, AVH or paranoia.  According to patient he just want to sleep,  and he didn't want to be admitted.   Recommend discharge for patient to follow-up with the men's shelter.  Flowsheet Row ED from 09/05/2022 in [facility] from 07/30/2022 in [facility] EM[facility] Pre-admit (Canceled) from 02/22/2021 in [facility] ASS[facility]  C-SSRS RISK CATEGORY No Risk No Risk No Risk       Psychiatric Specialty Exam  Presentation  General Appearance:Casual  Eye Contact:Fair  Speech:Clear and Coherent  Speech Volume:Normal  Handedness:Right   Mood and Affect  Mood: Anxious  Affect: Appropriate   Thought Process  Thought Processes: Coherent  Descriptions of Associations:Circumstantial  Orientation:Full (Time, Place and Person)  Thought Content:WDL  Diagnosis of Schizophrenia or Schizoaffective disorder in past: Yes  Duration of Psychotic Symptoms: Greater than six months  Hallucinations:None denies AVH  Ideas of Reference:None  Suicidal Thoughts:No  Homicidal Thoughts:No   Sensorium   Memory: Immediate Fair  Judgment: Fair  Insight: Fair   Art therapist  Concentration: Fair  Attention Span: Fair  Recall: [employer] of Knowledge: Fair  Language: Fair   Psychomotor Activity  Psychomotor Activity: Normal   Assets  Assets: Desire for Improvement; Housing   Sleep  Sleep: Fair  Number of hours:  6   No data recorded  Physical Exam: Physical Exam HENT:     Head: Normocephalic.  Cardiovascular:     Rate and Rhythm: Normal rate.  Musculoskeletal:        General: Normal range of motion.  Neurological:     General: No focal deficit present.     Mental Status: He is alert.  Psychiatric:        Behavior: Behavior normal.    Review of Systems  Constitutional: Negative.   HENT: Negative.    Eyes: Negative.   Respiratory: Negative.    Cardiovascular: Negative.   Gastrointestinal: Negative.   Genitourinary: Negative.   Musculoskeletal: Negative.   Skin: Negative.   Neurological: Negative.   Endo/Heme/Allergies: Negative.   Psychiatric/Behavioral:  The patient is nervous/anxious.    Blood pressure 109/78, pulse 80, temperature 98.7 F (37.1 C), temperature source Oral, resp. rate 16, SpO2 99 %. There is no height or weight on file to calculate BMI.  Musculoskeletal: Strength & Muscle Tone: within normal limits Gait & Station: normal Patient leans: Right and N/A   [facility] MSE Discharge Disposition for Follow up and Recommendations: Based on my evaluation the patient does not appear to have an emergency medical condition and can be discharged with resources and follow up care in outpatient services for pt to f/u with the shelter   Sindy Guadeloupe,  NP 09/05/2022, 5:50 AM

## 2022-09-05 NOTE — ED Triage Notes (Signed)
Pt presents to [facility] voluntarily, unaccompanied at this time with complaint of " wanting some place to sleep". Pt has a diagnosis of schizophrenia and a history of chronic homelessness.Pt currently denies SI, HI, AVH and substance/alcohol use.  

## 2022-09-05 NOTE — Discharge Instructions (Signed)
[facility] and Wellness [address] [place], [place] [address] [phone]  Community health and wellness pharmacy provides patient medications in the event they are unable to afford their prescriptions.  They will ask about income, SS, and household size. [facility] [address] [place], [place]  [address] [phone] ext [phone] Monday-Friday 8am-3pm Saturday-Sunday: 8am-2Pm Safe place to rest, take care of basic needs, do laundry and etc.   [facility]: [address] [place], [place] [address] [phone]  32 beds for men experiencing homelessness.  Call for openings  [facility] [address] [place], [place] [address] [phone]  Food Pantry Monday-Friday 9"30am-3:30pm Community Lunch : 10:30am-12:30am Call for shelter openings and or present at [facility]:  [address] [place], [place] [address] [phone]  Provides shelter.  Please call and or present at 8am for openings.   Free hot meals:  [facility] [address]  [place], [place]   [employer] [place] offers a free meal and a measure of hope in [facility] each Tuesday and Thursday evening.  [facility] Address: [address], [place], [place] [address] Phone: [phone]    [facility]:   Address: [address] [place], [place] [address] Hours:  Closed ? Opens 10?AM Tue Phone: [phone]

## 2022-09-05 NOTE — Progress Notes (Signed)
[facility] entered resources into AVS  Sha Burling [facility] 

## 2022-09-05 NOTE — ED Triage Notes (Signed)
Patient stating he has been having hallucinations, unable to tell this nurse for how long or what they are, just states "I dont know."

## 2022-09-06 ENCOUNTER — Ambulatory Visit (HOSPITAL_COMMUNITY)
Admission: EM | Admit: 2022-09-06 | Discharge: 2022-09-06 | Disposition: A | Discharge status: Home / Self Care | Payer: Medicaid Other | Attending: Nurse Practitioner | Admitting: Nurse Practitioner

## 2022-09-06 DIAGNOSIS — Z765 Malingerer [conscious simulation]: Secondary | ICD-10-CM | POA: Insufficient documentation

## 2022-09-06 DIAGNOSIS — Z5900 Homelessness unspecified: Secondary | ICD-10-CM | POA: Insufficient documentation

## 2022-09-06 NOTE — ED Triage Notes (Signed)
Pt presents to [name] voluntarily, unaccompanied at this time. Pt was asked what brought him to [name] and he stated " I can't breathe". Pt vitals are within normal limits and Spo2 is 99. This Clinical research associate does observes some wheezing, but pt is alert and oriented. Pt denies SI, HI, AVH and substance/alcohol use.

## 2022-09-06 NOTE — ED Provider Notes (Signed)
Behavioral Health Urgent Care Medical Screening Exam  Patient Name: [name] MRN: [id number] Date of Evaluation: 09/06/22 Chief Complaint:  ' refused to report" Diagnosis:  Final diagnoses:  Malingering  Homelessness    History of Present illness: [name] is a 31 y.o. male.  His psychiatric history of substance induced mood disorder, cannabis use disorder, moderate dependence, paranoid schizophrenia, alcohol abuse, malingering, and homelessness who presented voluntarily to [facility] as a walk-in due to homelessness.  Patient was seen face-to-face by this provider and chart reviewed.  Patient has a extensive history of malingering due to homelessness and was seen at this facility and at [facility] yesterday with similar complaints.  Patient did not follow-up with resources provided.  On evaluation, patient is sleepy, guarded and minimally cooperative with a lot of "I don't know answers" to my questions. Speech is clear but muffled at times. Pt appears well groomed. Eye contact is fair. Mood is euthymic, affect is congruent with mood. Thought process and thought content is WDL. Pt denies SI/HI/AVH. There is no indication that the patient is responding to internal stimuli. No delusions elicited during this assessment.    On why he is here tonight, patient reports "I'm not sure, I don't know".  Patient denies SI, denies HI, denies AVH or paranoia.  Patient reports he is tired due to lack of sleep, and reports his appetite is good.  Patient denies substance use.  Patient declined to state where he lives and reports "I don't know where I live, but I live with my aunt and I don't know".  Patient reports he is employed and reports "I don't know".  Patient denies access to a gun or weapon.  Patient declined offers of area homeless shelters open tonight.  Patient informed he does not meet admission criteria and will be discharged with homeless resources and taxi voucher.  Patient is nonchalant, became  uncooperative and laid down to sleep on the couch, ignoring this provider.  Patient declined to answer further questions.  Support encouragement and reassurance provided about ongoing stressors.  Patient provided with opportunity for questions.    Flowsheet Row ED from 09/05/2022 in [facility] ED from 07/30/2022 in [facility] DEPARTMENT Pre-admit (Canceled) from [facility]  C-SSRS RISK CATEGORY No Risk No Risk No Risk       Psychiatric Specialty Exam  Presentation  General Appearance:Appropriate for Environment  Eye Contact:Minimal  Speech:Normal Rate  Speech Volume:Normal  Handedness:Right   Mood and Affect  Mood: Euthymic  Affect: Congruent   Thought Process  Thought Processes: Coherent  Descriptions of Associations:Intact  Orientation:Full (Time, Place and Person)  Thought Content:WDL  Diagnosis of Schizophrenia or Schizoaffective disorder in past: Yes  Duration of Psychotic Symptoms: Greater than six months  Hallucinations:None denies AVH  Ideas of Reference:None  Suicidal Thoughts:No  Homicidal Thoughts:No   Sensorium  Memory: Immediate Good  Judgment: Intact  Insight: Present   Executive Functions  Concentration: Fair  Attention Span: Fair  Recall: [employer] of Knowledge: Fair  Language: Fair   Psychomotor Activity  Psychomotor Activity: Normal   Assets  Assets: Manufacturing systems engineer; Desire for Improvement; Physical Health; Resilience   Sleep  Sleep: Poor  Number of hours:  6   Nutritional Assessment (For OBS and FBC admissions only) Has the patient had a weight loss or gain of 10 pounds or more in the last 3 months?: No Has the patient had a decrease in  food intake/or appetite?: No Does the patient have dental problems?: No Does the patient have eating habits or behaviors that may be indicators of  an eating disorder including binging or inducing vomiting?: No Has the patient recently lost weight without trying?: 0 Has the patient been eating poorly because of a decreased appetite?: 0 Malnutrition Screening Tool Score: 0    Physical Exam: Physical Exam Constitutional:      General: He is not in acute distress.    Appearance: He is not diaphoretic.  HENT:     Head: Normocephalic.     Right Ear: External ear normal.     Left Ear: External ear normal.     Nose: No congestion.  Eyes:     General:        Right eye: No discharge.        Left eye: No discharge.  Cardiovascular:     Rate and Rhythm: Normal rate.  Pulmonary:     Effort: No respiratory distress.  Chest:     Chest wall: No tenderness.  Neurological:     Mental Status: He is alert and oriented to person, place, and time.  Psychiatric:        Attention and Perception: Attention and perception normal.        Mood and Affect: Mood and affect normal.        Speech: Speech normal.        Behavior: Behavior is cooperative.        Thought Content: Thought content normal. Thought content is not paranoid or delusional. Thought content does not include homicidal or suicidal ideation. Thought content does not include homicidal or suicidal plan.        Cognition and Memory: Cognition and memory normal.        Judgment: Judgment normal.    Review of Systems  Constitutional:  Negative for chills, diaphoresis and fever.  HENT:  Negative for congestion.   Eyes:  Negative for discharge.  Respiratory:  Negative for cough, shortness of breath and wheezing.   Cardiovascular:  Negative for chest pain and palpitations.  Gastrointestinal:  Negative for diarrhea, nausea and vomiting.  Neurological:  Negative for tingling, seizures, loss of consciousness, weakness and headaches.  Psychiatric/Behavioral:  Negative for depression, hallucinations, substance abuse and suicidal ideas. The patient is not nervous/anxious and does not have  insomnia.    Blood pressure 116/74, pulse 84, temperature 97.7 F (36.5 C), temperature source Oral, resp. rate 18, SpO2 99 %. There is no height or weight on file to calculate BMI.  Musculoskeletal: Strength & Muscle Tone: within normal limits Gait & Station: normal Patient leans: N/A   [facility] MSE Discharge Disposition for Follow up and Recommendations: Based on my evaluation the patient does not appear to have an emergency medical condition and can be discharged with resources and follow up care in outpatient services for area shelters/homelessness.  Recommend discharge to self and follow up with outpatient psychiatric resources and area homeless shelters. LCSW provided patient with resources and taxi voucher.   Discussed methods to reduce the risk of self-injury or suicide attempts: Frequent conversations regarding unsafe thoughts. Remove all significant sharps. Remove all firearms. Remove all medications, including over-the-counter meds. Consider lockbox for medications and having a responsible person dispense medications until patient has strengthened coping skills. Room checks for sharps or other harmful objects. Secure all chemical substances that can be ingested or inhaled.   Please refrain from using alcohol or illicit substances, as they can affect  your mood and can cause depression, anxiety or other concerning symptoms.  Alcohol can increase the chance that a person will make reckless decisions, like attempting suicide, and can increase the lethality of a drug overdose.    Discussed crisis plan, calling 911 or going to the ED if condition changes or worsens.   Patient discharged and condition at discharge is stable.  Mancel Bale, NP 09/06/2022, 12:53 AM

## 2022-09-06 NOTE — Discharge Instructions (Addendum)
  Due to expected cold temperatures, the [facility]) will re-open as a [employer] from 8:00PM tonight (Monday, 09/04/2022) through Tuesday (09/05/2022) at 8:00AM. [facility] will re-open on Tuesday at 8:00PM through Wednesday (09/06/2022) at 8:00AM.  There are no restrictions for entry. The [facility] is located at: [address] [place], [place]. If you have questions, please call Delene Loll at [phone].         Holiday representative Available for families, single men with children/head of household, single women, and single women with children/ head of household. Hours: The [facility] operates from 6:30PM-9:00AM. Please call the Center for [facility] beforehand at [phone] to ensure that they have space available. Address: [address], [place] Open [employer] Available for males only. Hours: The [employer] operates from 8:00PM-8:00AM. Address: [address] [employer] Available for single women with no dependents. Hours: Operates from 5:00PM to 9:00AM, but may remain open during the day depending on the weather. Winter shelter officially opens on December 1st. Address: [address] [place]   Discharge recommendations:  Patient is to take medications as prescribed. Please see information for follow-up appointment with psychiatry and therapy. Please follow up with your primary care provider for all medical related needs.   Therapy: We recommend that patient participate in individual therapy to address mental health concerns.  Medications: The parent/guardian is to contact a medical professional and/or outpatient provider to address any new side effects that develop. Parent/guardian should update outpatient providers of any new medications and/or medication changes.   Safety:  The patient should abstain from use of illicit substances/drugs and abuse of any  medications. If symptoms worsen or do not continue to improve or if the patient becomes actively suicidal or homicidal then it is recommended that the patient return to the closest hospital emergency department, the [facility], or call 911 for further evaluation and treatment. National Suicide Prevention Lifeline 1-800-SUICIDE or 845-168-8024.  About 988 988 offers 24/7 access to trained crisis counselors who can help people experiencing mental health-related distress. People can call or text 988 or chat 988lifeline.org for themselves or if they are worried about a loved one who may need crisis support.  Crisis Mobile: [facility]:                     (346)317-6544 (for crisis response 24 hours a day) [facility] Hotline:                                            478-491-7300

## 2022-09-18 IMAGING — DX DG CHEST 1V PORT
1 series · 1 of 1 positions shown · non-contrast
Comparison: 10/06/2020

CLINICAL DATA: Dyspnea

EXAM:
PORTABLE CHEST 1 VIEW

[chest ap]
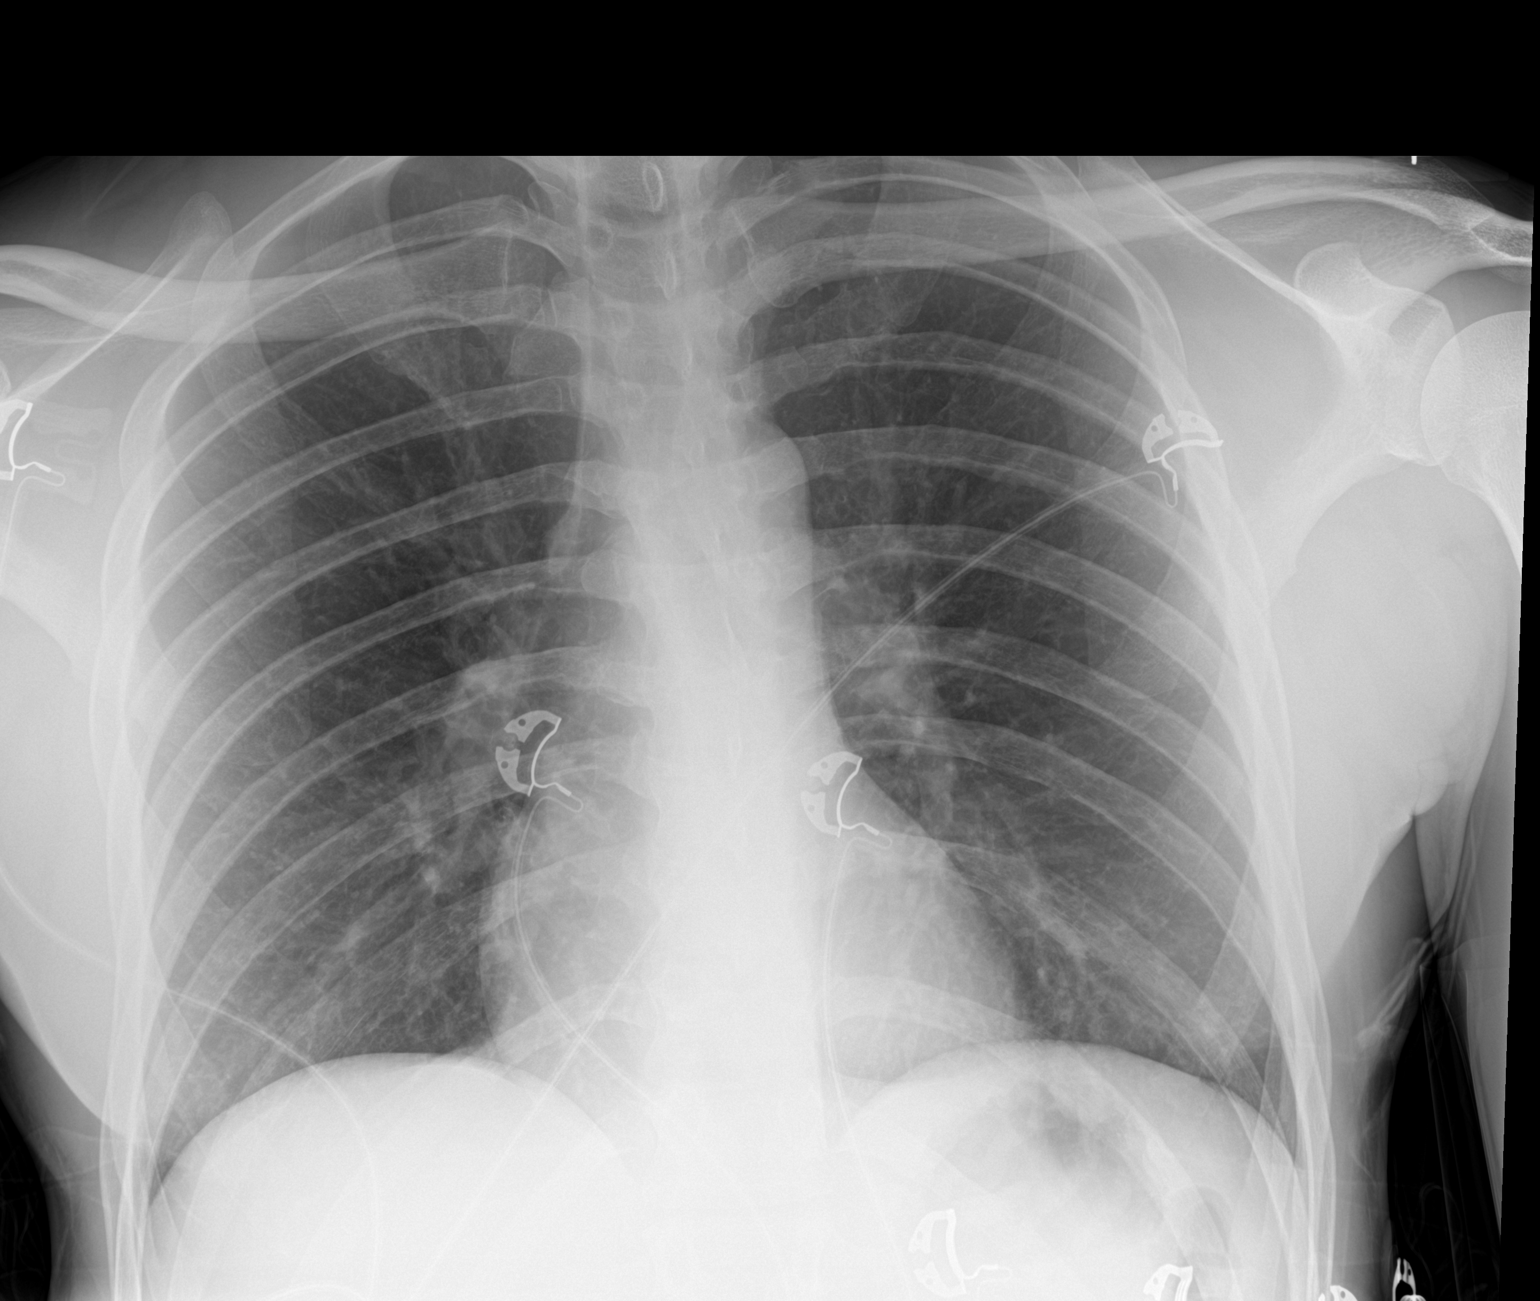

[1 of 1 positions shown; findings below may reference images not displayed]

FINDINGS: The heart size and mediastinal contours are within normal limits.
Both lungs are clear. The visualized skeletal structures are
unremarkable.
IMPRESSION: No active disease.

## 2022-10-22 ENCOUNTER — Ambulatory Visit (HOSPITAL_COMMUNITY)
Admission: EM | Admit: 2022-10-22 | Discharge: 2022-10-22 | Disposition: A | Discharge status: Home / Self Care | Payer: Medicaid Other | Attending: Urology | Admitting: Urology

## 2022-10-22 DIAGNOSIS — F200 Paranoid schizophrenia: Secondary | ICD-10-CM | POA: Insufficient documentation

## 2022-10-22 NOTE — ED Provider Notes (Signed)
Behavioral Health Urgent Care Medical Screening Exam  Patient Name: [name] MRN: [id number] Date of Evaluation: 10/22/22 Chief Complaint:   Diagnosis:  Final diagnoses:  Paranoid schizophrenia ([place])    History of Present illness: [name] is a 32 y.o. male with psychiatric history of substance abuse and paranoia.  Patient presented voluntarily to [facility] for a walk-in assessment.  Patient was evaluated face-to-face and his chart was reviewed by this nurse practitioner.  On initial approach, he requested to use the restroom and was later noted to be taking a shower. patient is alert and oriented x 4; he is calm and cooperative.  Patient denies any psychiatric or medical complaints. He is slightly irritable and requesting to be left alone so he can sleep. When ask reason for coming to [facility], he states "I don't know." He subsequently responded "I don't know" to all assessment question. He continues to deny psychiatric or medical needs. Patient is known to this psychiatric service and he appears to be at his baseline. No acute psychosis or delusional thought content noted during assessment.  He did not appear to be responding to any internal/external stimuli.  He denies suicidal ideation, homicidal ideation, auditory and visual hallucination, paranoia, and substance use.  [facility] ED from 10/22/2022 in [facility] ED from 09/05/2022 in [place] DEPT Pre-admit (Canceled) from 02/22/2021 in [place] No Risk No Risk No Risk       Psychiatric Specialty Exam  Presentation  General Appearance:Appropriate for Environment  Eye Contact:Good  Speech:Clear and Coherent  Speech Volume:Normal  Handedness:Right   Mood and Affect  Mood: Euthymic  Affect: Congruent   Thought Process  Thought Processes: Coherent  Descriptions of  Associations:Intact  Orientation:Full (Time, Place and Person)  Thought Content:WDL  Diagnosis of Schizophrenia or Schizoaffective disorder in past: Yes  Duration of Psychotic Symptoms: Greater than six months  Hallucinations:None denies AVH  Ideas of Reference:None  Suicidal Thoughts:No  Homicidal Thoughts:No   Sensorium  Memory: Immediate Fair; Recent Poor; Remote Poor  Judgment: Fair  Insight: Poor   Executive Functions  Concentration: Fair  Attention Span: Good  Recall: Good  Fund of Knowledge: Good  Language: Good   Psychomotor Activity  Psychomotor Activity: Normal   Assets  Assets: Communication Skills; Desire for Improvement; Resilience; Physical Health   Sleep  Sleep: Fair  Number of hours:  6   No data recorded  Physical Exam: Physical Exam Vitals and nursing note reviewed.  Constitutional:      General: He is not in acute distress.    Appearance: He is well-developed.  HENT:     Head: Normocephalic and atraumatic.  Eyes:     Conjunctiva/sclera: Conjunctivae normal.  Cardiovascular:     Rate and Rhythm: Normal rate.  Pulmonary:     Effort: Pulmonary effort is normal. No respiratory distress.  Abdominal:     Palpations: Abdomen is soft.     Tenderness: There is no abdominal tenderness.  Musculoskeletal:        General: No swelling.     Cervical back: Neck supple.  Skin:    General: Skin is warm and dry.     Capillary Refill: Capillary refill takes less than 2 seconds.  Neurological:     Mental Status: He is alert and oriented to person, place, and time.  Psychiatric:        Attention and Perception: Attention and perception normal.  Mood and Affect: Mood and affect normal.        Speech: Speech normal.        Behavior: Behavior normal. Behavior is cooperative.        Thought Content: Thought content normal.    Review of Systems  Constitutional: Negative.   HENT: Negative.    Eyes: Negative.    Respiratory: Negative.    Cardiovascular: Negative.   Gastrointestinal: Negative.   Genitourinary: Negative.   Musculoskeletal: Negative.   Skin: Negative.   Neurological: Negative.   Endo/Heme/Allergies: Negative.   Psychiatric/Behavioral: Negative.     Blood pressure (!) 143/94, pulse 64, temperature (!) 97.5 F (36.4 C), temperature source Oral, resp. rate 18, SpO2 96 %. There is no height or weight on file to calculate BMI.  Musculoskeletal: Strength & Muscle Tone: within normal limits Gait & Station: normal Patient leans: Right   [place] MSE Discharge Disposition for Follow up and Recommendations: Based on my evaluation the patient does not appear to have an emergency medical condition and can be discharged with resources and follow up care in outpatient services for Medication Management and Individual Therapy  No evidence of imminent danger to self or others at this time. Patient does not meet criteria for psychiatric admission or IVC. Supportive therapy provided about ongoing stressors. Discussed crisis plan, callling 911/988 or going to Emergency Dept    Ophelia Shoulder, NP 10/22/2022, 7:11 AM

## 2022-10-22 NOTE — ED Triage Notes (Addendum)
Pt presents to [facility] voluntarily. Pt reports hearing voices. Pt did not report any description of auditory hallnications. Pt denies SI/HI. Pt is routine.

## 2022-10-24 DIAGNOSIS — Z765 Malingerer [conscious simulation]: Secondary | ICD-10-CM

## 2022-10-24 DIAGNOSIS — Z59819 Housing instability, housed unspecified: Secondary | ICD-10-CM

## 2022-10-24 NOTE — Discharge Instructions (Signed)
F/u with [facility]

## 2022-10-24 NOTE — ED Provider Notes (Signed)
Behavioral Health Urgent Care Medical Screening Exam  Patient Name: [name] MRN: [id number] Date of Evaluation: 10/24/22 Chief Complaint:   Diagnosis:  Final diagnoses:  Malingering  Housing instability, housed unspecified    History of Present illness: Raymie Giammarco is a 32 y.o. male. With a history of schizophrenia,  malingering, housing instability, presented to [facility], voluntarily.  Per the patient is cold outside and he just needs somewhere to sleep.  Patient does have a history of ER visits, where he just show up needing a place to sleep.   Face-to-face observation of patient, patient is alert and oriented x 4, speech is clear, maintaining eye contact.  Patient did not want to come back to the interview room patient wanted to stay out in the lobby area.  Per the patient he just wants somewhere to sleep because it is cold outside.  Patient denies SI, HI, AVH or paranoia at this time.  Patient denies any alcohol use or recent illicit drug use.   Recommend discharge for patient to follow-up with ACT team   [place] ED from 10/24/2022 in [facility] ED from 10/22/2022 in [place] ([place]) from 02/22/2021 in [place] CATEGORY No Risk No Risk No Risk       Psychiatric Specialty Exam  Presentation  General Appearance:Disheveled  Eye Contact:Good  Speech:Clear and Coherent  Speech Volume:Normal  Handedness:Right   Mood and Affect  Mood: Anxious  Affect: Appropriate   Thought Process  Thought Processes: Coherent  Descriptions of Associations:Circumstantial  Orientation:Full (Time, Place and Person)  Thought Content:WDL  Diagnosis of Schizophrenia or Schizoaffective disorder in past: Yes  Duration of Psychotic Symptoms: Greater than six months  Hallucinations:None denies AVH  Ideas of Reference:None  Suicidal Thoughts:No  Homicidal  Thoughts:No   Sensorium  Memory: Immediate Fair  Judgment: Fair  Insight: Fair   Community education officer  Concentration: Fair  Attention Span: Good  Recall: Good  Fund of Knowledge: Good  Language: Good   Psychomotor Activity  Psychomotor Activity: Normal   Assets  Assets: Desire for Improvement; Housing   Sleep  Sleep: Fair  Number of hours:  6   No data recorded  Physical Exam: Physical Exam HENT:     Head: Normocephalic.     Nose: Nose normal.  Cardiovascular:     Rate and Rhythm: Normal rate.  Pulmonary:     Effort: Pulmonary effort is normal.  Musculoskeletal:        General: Normal range of motion.     Cervical back: Normal range of motion.  Neurological:     General: No focal deficit present.     Mental Status: He is alert.  Psychiatric:        Mood and Affect: Mood normal.    Review of Systems  Constitutional: Negative.   HENT: Negative.    Eyes: Negative.   Respiratory: Negative.    Cardiovascular: Negative.   Gastrointestinal: Negative.   Genitourinary: Negative.   Musculoskeletal: Negative.   Skin: Negative.   Neurological: Negative.   Endo/Heme/Allergies: Negative.   Psychiatric/Behavioral:  The patient is nervous/anxious.    Blood pressure 117/86, pulse 72, temperature 98.4 F (36.9 C), temperature source Oral, resp. rate 16, SpO2 98 %. There is no height or weight on file to calculate BMI.  Musculoskeletal: Strength & Muscle Tone: within normal limits Gait & Station: normal Patient leans: N/A   [place] MSE Discharge Disposition for Follow up  and Recommendations: Based on my evaluation the patient does not appear to have an emergency medical condition and can be discharged with resources and follow up care in outpatient services for Medication Management and Individual Therapy   Evette Georges, NP 10/24/2022, 8:27 PM

## 2022-10-24 NOTE — ED Triage Notes (Signed)
Pt presents to [facility] voluntarily, unaccompanied at this time with complaint of " wanting some place to sleep". Pt has a diagnosis of schizophrenia and a history of chronic homelessness.Pt currently denies SI, HI, AVH and substance/alcohol use.

## 2022-10-25 ENCOUNTER — Ambulatory Visit (HOSPITAL_COMMUNITY)
Admission: EM | Admit: 2022-10-25 | Discharge: 2022-10-25 | Disposition: A | Discharge status: Home / Self Care | Payer: Medicaid Other

## 2022-10-25 DIAGNOSIS — Z59819 Housing instability, housed unspecified: Secondary | ICD-10-CM

## 2022-10-25 DIAGNOSIS — Z765 Malingerer [conscious simulation]: Secondary | ICD-10-CM

## 2022-11-04 ENCOUNTER — Emergency Department (HOSPITAL_COMMUNITY)
Admission: EM | Admit: 2022-11-04 | Discharge: 2022-11-05 | Disposition: A | Discharge status: Home / Self Care | Payer: Medicaid Other | Attending: Emergency Medicine | Admitting: Emergency Medicine

## 2022-11-04 ENCOUNTER — Ambulatory Visit (HOSPITAL_COMMUNITY)
Admission: EM | Admit: 2022-11-04 | Discharge: 2022-11-04 | Disposition: A | Discharge status: Home / Self Care | Payer: Medicaid Other | Attending: Nurse Practitioner | Admitting: Nurse Practitioner

## 2022-11-04 ENCOUNTER — Other Ambulatory Visit: Payer: Self-pay

## 2022-11-04 DIAGNOSIS — F200 Paranoid schizophrenia: Secondary | ICD-10-CM | POA: Insufficient documentation

## 2022-11-04 DIAGNOSIS — Z59819 Housing instability, housed unspecified: Secondary | ICD-10-CM

## 2022-11-04 DIAGNOSIS — Z5321 Procedure and treatment not carried out due to patient leaving prior to being seen by health care provider: Secondary | ICD-10-CM | POA: Diagnosis not present

## 2022-11-04 DIAGNOSIS — Z5900 Homelessness unspecified: Secondary | ICD-10-CM | POA: Insufficient documentation

## 2022-11-04 DIAGNOSIS — J3489 Other specified disorders of nose and nasal sinuses: Secondary | ICD-10-CM | POA: Insufficient documentation

## 2022-11-04 DIAGNOSIS — X31XXXA Exposure to excessive natural cold, initial encounter: Secondary | ICD-10-CM | POA: Insufficient documentation

## 2022-11-04 NOTE — ED Provider Notes (Signed)
Behavioral Health Urgent Care Medical Screening Exam  Patient Name: [name] MRN: [id number] Date of Evaluation: 11/04/22 Chief Complaint:  I am sleepy Diagnosis:  Final diagnoses:  Paranoid schizophrenia ([place])  Housing instability    History of Present illness: [name] is a 32 y.o. male presenting voluntarily unaccompanied to [facility] today stating that he is homeless and sleepy. Patient is familiar to this Probation officer as he has 12 ED visits in the last 6 months with similar complaint. Patient states that he is does not have any where to live and it is unclear if this is accurate information. Patient answers most questions with, "I don't know".   Patient is observed lying down on the bench in the assessment room with eyes closed. Patient is easily aroused when his name is called. Patient denies any suicidal ideations, homicidal ideation or auditory or visual hallucinations. Patient does not appear to be responding to any internal or external stimuli at this time. Patient denies any alcohol or substance use. Per chart review patient is being followed by [facility] Act team. Recommend discharge and for patient to follow up with his ACT team.  [place] ED from 11/04/2022 in [facility] ED from 10/25/2022 in [place] ([place]) from 02/22/2021 in [place] No Risk No Risk No Risk       Psychiatric Specialty Exam  Presentation  General Appearance:Disheveled  Eye Contact:Minimal  Speech:Clear and Coherent  Speech Volume:Decreased  Handedness:Right   Mood and Affect  Mood: Euthymic  Affect: Appropriate   Thought Process  Thought Processes: Coherent  Descriptions of Associations:Circumstantial  Orientation:Full (Time, Place and Person)  Thought Content:WDL  Diagnosis of Schizophrenia or Schizoaffective disorder in past: Yes  Duration of  Psychotic Symptoms: Greater than six months  Hallucinations:None denies AVH  Ideas of Reference:None  Suicidal Thoughts:No  Homicidal Thoughts:No   Sensorium  Memory: Immediate Fair  Judgment: Poor  Insight: Poor   Executive Functions  Concentration: Fair  Attention Span: Fair  Recall: [employer] of Knowledge: Fair  Language: Fair   Psychomotor Activity  Psychomotor Activity: Normal   Assets  Assets: Armed forces logistics/support/administrative officer; Desire for Improvement; Resilience; Physical Health   Sleep  Sleep: Poor  Number of hours:  -1   Physical Exam: Physical Exam HENT:     Head: Normocephalic and atraumatic.     Nose: Nose normal.  Eyes:     Pupils: Pupils are equal, round, and reactive to light.  Cardiovascular:     Rate and Rhythm: Normal rate.  Pulmonary:     Effort: Pulmonary effort is normal.  Abdominal:     General: Abdomen is flat.  Musculoskeletal:        General: Normal range of motion.     Cervical back: Normal range of motion.  Skin:    General: Skin is warm.  Neurological:     Mental Status: He is alert and oriented to person, place, and time.  Psychiatric:        Attention and Perception: He is inattentive.        Mood and Affect: Mood normal. Affect is labile.        Speech: Speech normal.        Behavior: Behavior is cooperative.        Thought Content: Thought content normal.        Judgment: Judgment is impulsive.   Review of Systems  Constitutional: Negative.  HENT: Negative.    Eyes: Negative.   Respiratory: Negative.    Cardiovascular: Negative.   Gastrointestinal: Negative.   Genitourinary: Negative.   Musculoskeletal: Negative.   Skin: Negative.   Neurological: Negative.   Endo/Heme/Allergies: Negative.   Psychiatric/Behavioral: Negative.     Blood pressure 118/85, pulse 70, temperature 98.8 F (37.1 C), temperature source Oral, resp. rate 18, SpO2 100 %. There is no height or weight on file to calculate  BMI.  Musculoskeletal: Strength & Muscle Tone: within normal limits Gait & Station: normal Patient leans: N/A   [place] MSE Discharge Disposition for Follow up and Recommendations: Based on my evaluation the patient does not appear to have an emergency medical condition and can be discharged with resources and follow up care in outpatient services for Medication Management and Individual Therapy Patient can be safely discharged home to follow up with ACT team for outpatient medication management and therapy.   Lucia Bitter, NP 11/04/2022, 6:06 AM

## 2022-11-04 NOTE — ED Triage Notes (Signed)
Pt to triage with reports of being cold. When asked what brought pt to ER, pt reports "I'm cold, I dont know." Pt doesn't given reasons for why he is here besides being cold. Pt denies SI and denies drug use.

## 2022-11-04 NOTE — Discharge Instructions (Signed)

## 2022-11-04 NOTE — ED Triage Notes (Signed)
Pt presents to [facility] voluntarily, unaccompanied at this time due to "being sick". Pt vitals are within normal limits. Pt states he is tired and just wants to sleep. Pt denies SI, HI, AVH and substance/alcohol use.

## 2022-12-17 IMAGING — DX DG CHEST 1V PORT
1 series · 1 of 1 positions shown · non-contrast
Comparison: 03/23/2021

CLINICAL DATA: Short of breath.

EXAM:
PORTABLE CHEST 1 VIEW

[chest ap]
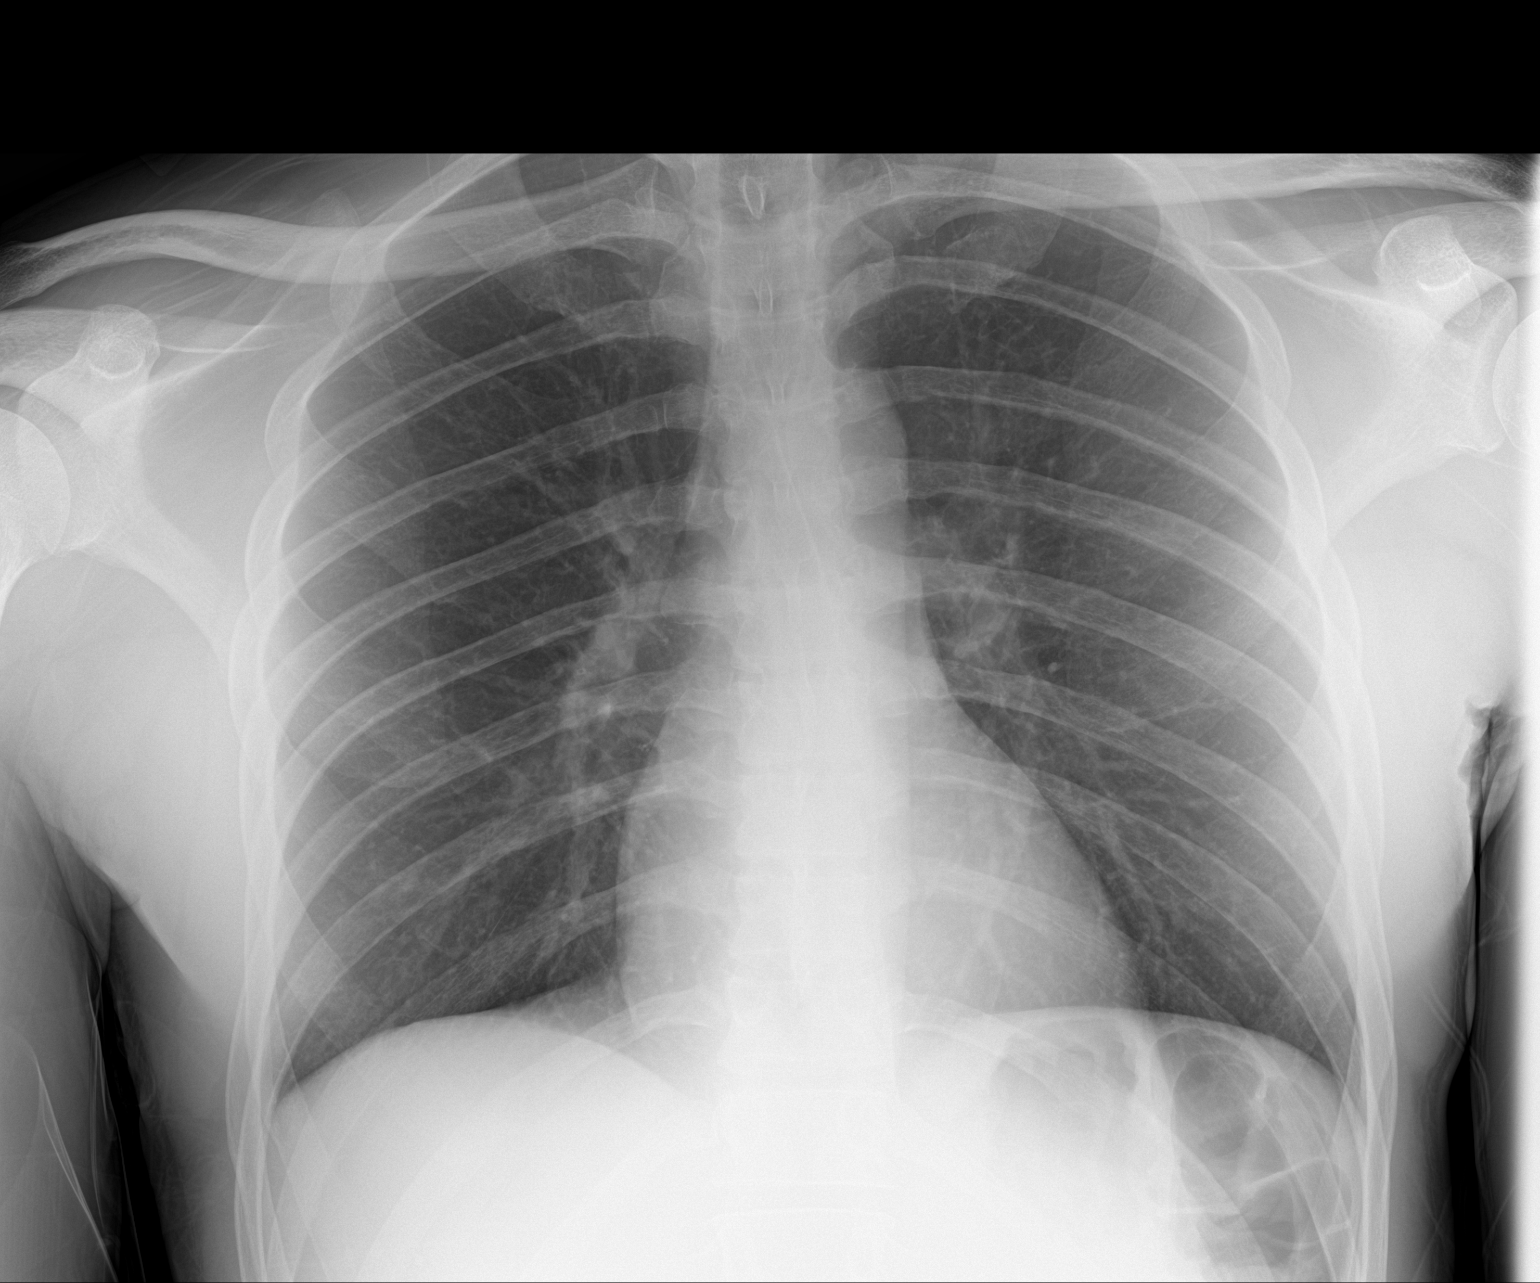

[1 of 1 positions shown; findings below may reference images not displayed]

FINDINGS: Cardiac silhouette is normal in size. Normal mediastinal and hilar
contours. Clear lungs. No pleural effusion or pneumothorax.

Skeletal structures are grossly intact.
IMPRESSION: No active disease.

## 2022-12-22 ENCOUNTER — Ambulatory Visit (HOSPITAL_COMMUNITY)
Admission: EM | Admit: 2022-12-22 | Discharge: 2022-12-23 | Disposition: A | Discharge status: Home / Self Care | Payer: Medicaid Other | Attending: Urology | Admitting: Urology

## 2022-12-22 DIAGNOSIS — Z765 Malingerer [conscious simulation]: Secondary | ICD-10-CM | POA: Insufficient documentation

## 2022-12-22 DIAGNOSIS — F200 Paranoid schizophrenia: Secondary | ICD-10-CM

## 2022-12-23 ENCOUNTER — Emergency Department (HOSPITAL_COMMUNITY)
Admission: EM | Admit: 2022-12-23 | Discharge: 2022-12-23 | Disposition: A | Discharge status: Home / Self Care | Payer: Medicaid Other | Attending: Emergency Medicine | Admitting: Emergency Medicine

## 2022-12-23 ENCOUNTER — Ambulatory Visit (HOSPITAL_COMMUNITY)
Admission: EM | Admit: 2022-12-23 | Discharge: 2022-12-23 | Disposition: A | Discharge status: Home / Self Care | Payer: Medicaid Other

## 2022-12-23 ENCOUNTER — Other Ambulatory Visit: Payer: Self-pay

## 2022-12-23 ENCOUNTER — Emergency Department (HOSPITAL_COMMUNITY)
Admission: EM | Admit: 2022-12-23 | Discharge: 2022-12-23 | Disposition: A | Discharge status: Home / Self Care | Payer: Medicaid Other | Source: Home / Self Care | Attending: Emergency Medicine | Admitting: Emergency Medicine

## 2022-12-23 ENCOUNTER — Emergency Department (HOSPITAL_COMMUNITY): Payer: Medicaid Other

## 2022-12-23 ENCOUNTER — Encounter (HOSPITAL_COMMUNITY): Payer: Self-pay

## 2022-12-23 DIAGNOSIS — R062 Wheezing: Secondary | ICD-10-CM | POA: Insufficient documentation

## 2022-12-23 DIAGNOSIS — Z5900 Homelessness unspecified: Secondary | ICD-10-CM | POA: Insufficient documentation

## 2022-12-23 DIAGNOSIS — R0602 Shortness of breath: Secondary | ICD-10-CM | POA: Insufficient documentation

## 2022-12-23 DIAGNOSIS — Z765 Malingerer [conscious simulation]: Secondary | ICD-10-CM

## 2022-12-23 MED ORDER — ALBUTEROL SULFATE HFA 108 (90 BASE) MCG/ACT IN AERS
1.0000 | INHALATION_SPRAY | Freq: Once | RESPIRATORY_TRACT | Status: DC
  Filled 2022-12-23: qty 6.7

## 2022-12-23 NOTE — ED Notes (Signed)
Informed pt will be getting discharged and obtain vitals and told him to get dress. Someone will be in to give him his papers. Pt continued to lay in bed when I was walking out.

## 2022-12-23 NOTE — ED Notes (Signed)
Pt removed monitoring equipment.  Pt resting in bed; appears to be at ease at this time with no distress noted.

## 2022-12-23 NOTE — Discharge Instructions (Signed)
You have been seen today for your complaint of shortness of breath. Follow up with: Your primary care provider as needed Please seek immediate medical care if you develop any of the following symptoms: Your shortness of breath gets worse. You have shortness of breath when you are resting. You feel light-headed or you faint. You have a cough that is not controlled with medicines. You cough up blood. You have pain with breathing. You have pain in your chest, arms, shoulders, or abdomen. You have a fever. At this time there does not appear to be the presence of an emergent medical condition, however there is always the potential for conditions to change. Please read and follow the below instructions.  Do not take your medicine if  develop an itchy rash, swelling in your mouth or lips, or difficulty breathing; call 911 and seek immediate emergency medical attention if this occurs.  You may review your lab tests and imaging results in their entirety on your MyChart account.  Please discuss all results of fully with your primary care provider and other specialist at your follow-up visit.  Note: Portions of this text may have been transcribed using voice recognition software. Every effort was made to ensure accuracy; however, inadvertent computerized transcription errors may still be present.

## 2022-12-23 NOTE — ED Provider Notes (Signed)
[facility] Provider Note   CSN: [id number] Arrival date & time: 12/23/22  1727     History  Chief Complaint  Patient presents with   Shortness of Breath    [name] is a 32 y.o. male.  With a history of polysubstance abuse, homelessness, schizophrenia who presents to the ED via EMS for difficulty breathing.  Was found outside and was complaining of shortness of breath and dehydration.  Would not answer any questions from EMS.  Would also not allow a physical exam able to hold his breath when they attempted to listen to lung sounds.  Does not know how long he has been short of breath for.  States he has used albuterol in the past but does not know what he used it for the last time that he used it.  Denies chest pain, dizziness, lightheadedness, cough.  He is not compliant with his home medications.  He was seen by behavioral health urgent care this morning and discharged with a diagnosis of malingering.  Then presented here approximately 1 hour later complaining of shortness of breath but had a normal exam.   Shortness of Breath      Home Medications Prior to Admission medications   Medication Sig Start Date End Date Taking? Authorizing Provider  hydrOXYzine (ATARAX) 25 MG tablet Take 1 tablet (25 mg total) by mouth every 6 (six) hours. Patient not taking: Reported on 07/30/2022 02/24/22   Margarita Mail, PA-C  paliperidone (INVEGA) 6 MG 24 hr tablet Take 1 tablet (6 mg total) by mouth daily for 14 days. Patient not taking: No sig reported 01/02/21 02/22/21  Garald Balding, PA-C      Allergies    Shellfish allergy and Bee pollen    Review of Systems   Review of Systems  Respiratory:  Positive for shortness of breath.   All other systems reviewed and are negative.   Physical Exam Updated Vital Signs BP 118/63   Pulse 81   Temp 99.2 F (37.3 C) (Oral)   Resp 15   Ht 5\' 5"  (1.651 m)   Wt 70 kg   SpO2 94%   BMI 25.68 kg/m   Physical Exam Vitals and nursing note reviewed.  Constitutional:      General: He is not in acute distress.    Appearance: He is well-developed.  HENT:     Head: Normocephalic and atraumatic.  Eyes:     Conjunctiva/sclera: Conjunctivae normal.  Cardiovascular:     Rate and Rhythm: Normal rate and regular rhythm.     Heart sounds: No murmur heard. Pulmonary:     Effort: Pulmonary effort is normal. No respiratory distress.     Breath sounds: Examination of the right-upper field reveals wheezing. Wheezing present.  Abdominal:     Palpations: Abdomen is soft.     Tenderness: There is no abdominal tenderness.  Musculoskeletal:        General: No swelling.     Cervical back: Neck supple.  Skin:    General: Skin is warm and dry.     Capillary Refill: Capillary refill takes less than 2 seconds.  Neurological:     Mental Status: He is alert.  Psychiatric:        Mood and Affect: Mood normal.     ED Results / Procedures / Treatments   Labs (all labs ordered are listed, but only abnormal results are displayed) Labs Reviewed - No data to display  EKG None  Radiology DG Chest Port 1 View  Result Date: 12/23/2022 CLINICAL DATA:  Dyspnea EXAM: PORTABLE CHEST 1 VIEW COMPARISON:  12/11/2022 FINDINGS: The heart size and mediastinal contours are within normal limits. Both lungs are clear. The visualized skeletal structures are unremarkable. IMPRESSION: No active disease. Electronically Signed   By: Fidela Salisbury M.D.   On: 12/23/2022 04:11    Procedures Procedures    Medications Ordered in ED Medications  albuterol (VENTOLIN HFA) 108 (90 Base) MCG/ACT inhaler 1-2 puff ( Inhalation Patient Refused/Not Given 12/23/22 1811)    ED Course/ Medical Decision Making/ A&P Clinical Course as of 12/23/22 1849  Sat Dec 23, 2022  1836 Patient declined albuterol inhaler.  States he is no longer short of breath and would like to be discharged [AS]    Clinical Course User Index [AS] Colby Reels,  [place]on Folk, PA-C                             Medical Decision Making Risk Prescription drug management.  This patient presents to the ED for concern of shortness of breath, this involves an extensive number of treatment options, and is a complaint that carries with it a high risk of complications and morbidity.  The differential diagnosis includes The emergent differential diagnosis for shortness of breath includes, but is not limited to, Pulmonary edema, bronchoconstriction, Pneumonia, Pulmonary embolism, Pneumotherax/ Hemothorax, Dysrythmia, ACS.    Co morbidities that complicate the patient evaluation  polysubstance abuse, homelessness, schizophrenia  My initial workup includes albuterol  Additional history obtained from: Nursing notes from this visit. Previous records within EMR system ED visit this morning for same  Afebrile, hemodynamically stable.  32 year old male presenting to the ED evaluation of shortness of breath and requesting albuterol inhaler.  He does not contribute much to his history and mostly answers in yes or no statements.  On exam, some faint expiratory wheezing was noted in the right upper lobe.  Albuterol inhaler was ordered and brought to the patient.  He declined at that time stating that he is no longer short of breath.  He was then requesting discharge.  He had a normal chest x-ray and EKG this morning.  Was encouraged to use the inhaler, however declined again.  He is in no respiratory distress.  His pulse ox remains near 100% on room air.  Likely a component of malingering.  He will be discharged in stable condition.  At this time there does not appear to be any evidence of an acute emergency medical condition and the patient appears stable for discharge with appropriate outpatient follow up. Diagnosis was discussed with patient who verbalizes understanding of care plan and is agreeable to discharge. I have discussed return precautions with patient who  verbalizes understanding. Patient encouraged to follow-up with their PCP within 1 week. All questions answered.  Note: Portions of this report may have been transcribed using voice recognition software. Every effort was made to ensure accuracy; however, inadvertent computerized transcription errors may still be present.        Final Clinical Impression(s) / ED Diagnoses Final diagnoses:  Wheezing  Malingering    Rx / DC Orders ED Discharge Orders     None         [name] 12/23/22 1901    Godfrey Pick, MD 12/24/22 1150

## 2022-12-23 NOTE — ED Notes (Signed)
Patient refused his albuterol inhaler. Stated he didn't know how to use it. RN said you inhale it and he shook his head no. RN asked him if he was going to take the medication and he shook his head no. EDP notified.

## 2022-12-23 NOTE — Discharge Instructions (Signed)

## 2022-12-23 NOTE — ED Notes (Signed)
Patient given discharge instructions and packet. Patient had no questions. RN also provided drink and sandwich bag.

## 2022-12-23 NOTE — ED Provider Notes (Signed)
Behavioral Health Urgent Care Medical Screening Exam  Patient Name: [name] MRN: [id number] Date of Evaluation: 12/23/22 Chief Complaint:   Diagnosis:  Final diagnoses:  Malingering    History of Present illness: [name] is a 32 y.o. male. ***  Flowsheet Row ED from 12/23/2022 in [facility] Emergency Department at [facility] Most recent reading at 12/23/2022  5:46 PM ED from 12/23/2022 in [facility] Emergency Department at [facility] Most recent reading at 12/23/2022  2:56 AM Pre-admit (Canceled) from 02/22/2021 in [place] Most recent reading at 02/22/2021  3:47 AM  C-SSRS RISK CATEGORY No Risk No Risk No Risk       Psychiatric Specialty Exam  Presentation  General Appearance:Appropriate for Environment  Eye Contact:Good  Speech:Clear and Coherent  Speech Volume:Normal  Handedness:Right   Mood and Affect  Mood: Euthymic  Affect: Appropriate   Thought Process  Thought Processes: Coherent  Descriptions of Associations:Intact  Orientation:Full (Time, Place and Person)  Thought Content:WDL  Diagnosis of Schizophrenia or Schizoaffective disorder in past: Yes  Duration of Psychotic Symptoms: Greater than six months  Hallucinations:None denies AVH  Ideas of Reference:None  Suicidal Thoughts:No  Homicidal Thoughts:No   Sensorium  Memory: Immediate Fair; Remote Poor; Recent Poor  Judgment: Poor  Insight: Poor   Executive Functions  Concentration: Fair  Attention Span: Fair  Recall: Poor  Fund of Knowledge: Poor  Language: Fair   Psychomotor Activity  Psychomotor Activity: Normal   Assets  Assets: Physical Health; Resilience   Sleep  Sleep: Poor  Number of hours:  4   Physical Exam: Physical Exam ROS Blood pressure (!) 140/85, pulse 75, temperature 98 F (36.7 C), resp. rate 18, SpO2 100 %. There is no height or weight on file to calculate  BMI.  Musculoskeletal: Strength & Muscle Tone: {desc; muscle tone:32375} Gait & Station: {PE GAIT ED NATL:22525} Patient leans: {Patient Leans:21022755}   [place] MSE Discharge Disposition for Follow up and Recommendations: {[facility] MSE Recommendations:24277}   Ophelia Shoulder, NP 12/23/2022, 8:38 PM

## 2022-12-23 NOTE — ED Triage Notes (Signed)
Patient bib [facility] from outside [facility]. [facility] reports patient stated shortness of breath and dehydration but was non compliant with all assessments. [facility] reports he refused to let them listen to lung sounds he would hold his breath . [facility] reports he does not appear short of breath. Patient has a history of schizophrenia and is non-compliant with all medications.

## 2022-12-23 NOTE — ED Provider Notes (Signed)
[facility] Provider Note  CSN: [id number] Arrival date & time: 12/23/22 0244  Chief Complaint(s) Shortness of Breath  HPI [name] is a 32 y.o. male with a past medical history listed below including schizophrenia, homelessness, polysubstance use disorder and malingering.  Patient requesting albuterol.   presents to the ED via EMS for shortness of breath.  Patient refuses to answer questions stating only that he wants albuterol.  He did admit to never requiring albuterol.  Denies any history of asthma or COPD.  On review of records, patient was just seen at behavioral health urgent care and discharged less than an hour prior to his presentation.  Now is incomplete but discharge diagnosis includes malingering.  The history is provided by the patient.    Past Medical History Past Medical History:  Diagnosis Date   Depression    Schizophrenia [facility])    Patient Active Problem List   Diagnosis Date Noted   Malingering 05/24/2021   Homelessness 05/24/2021   Alcohol abuse 03/21/2021   Substance-induced disorder ([place]) 01/02/2021   Schizophrenia, paranoid type ([employer]) 10/24/2020   Paranoid schizophrenia ([place]) 01/05/2020   Other schizophrenia ([place]) 02/14/2018   Cannabis use disorder, moderate, dependence ([place]) 05/10/2016   Home Medication(s) Prior to Admission medications   Medication Sig Start Date End Date Taking? Authorizing Provider  hydrOXYzine (ATARAX) 25 MG tablet Take 1 tablet (25 mg total) by mouth every 6 (six) hours. Patient not taking: Reported on 07/30/2022 02/24/22   Margarita Mail, PA-C  paliperidone (INVEGA) 6 MG 24 hr tablet Take 1 tablet (6 mg total) by mouth daily for 14 days. Patient not taking: No sig reported 01/02/21 02/22/21  Garald Balding, PA-C                                                                                                                                    Allergies Shellfish allergy and Bee  pollen  Review of Systems Review of Systems As noted in HPI  Physical Exam Vital Signs  I have reviewed the triage vital signs BP (!) 125/56 (BP Location: Right Arm)   Pulse 71   Temp 98.8 F (37.1 C) (Oral)   Resp 14   SpO2 96%   Physical Exam Vitals reviewed.  Constitutional:      General: He is not in acute distress.    Appearance: He is well-developed. He is not diaphoretic.  HENT:     Head: Normocephalic and atraumatic.     Nose: Nose normal.  Eyes:     General: No scleral icterus.       Right eye: No discharge.        Left eye: No discharge.     Conjunctiva/sclera: Conjunctivae normal.     Pupils: Pupils are equal, round, and reactive to light.  Cardiovascular:     Rate and Rhythm: Normal rate and regular rhythm.  Heart sounds: No murmur heard.    No friction rub. No gallop.  Pulmonary:     Effort: Pulmonary effort is normal. No respiratory distress.     Breath sounds: Normal breath sounds. No stridor. No rales.  Abdominal:     General: There is no distension.     Palpations: Abdomen is soft.     Tenderness: There is no abdominal tenderness.  Musculoskeletal:        General: No tenderness.     Cervical back: Normal range of motion and neck supple.  Skin:    General: Skin is warm and dry.     Findings: No erythema or rash.  Neurological:     Mental Status: He is alert and oriented to person, place, and time.     ED Results and Treatments Labs (all labs ordered are listed, but only abnormal results are displayed) Labs Reviewed - No data to display                                                                                                                       EKG  EKG Interpretation  Date/Time:  Saturday December 23 2022 02:51:49 EDT Ventricular Rate:  76 PR Interval:  137 QRS Duration: 83 QT Interval:  381 QTC Calculation: 429 R Axis:   67 Text Interpretation: Sinus rhythm LVH by voltage ST elev, probable normal early repol pattern Confirmed  by Addison Lank [phone]) on 12/23/2022 3:35:36 AM       Radiology DG Chest Port 1 View  Result Date: 12/23/2022 CLINICAL DATA:  Dyspnea EXAM: PORTABLE CHEST 1 VIEW COMPARISON:  12/11/2022 FINDINGS: The heart size and mediastinal contours are within normal limits. Both lungs are clear. The visualized skeletal structures are unremarkable. IMPRESSION: No active disease. Electronically Signed   By: Fidela Salisbury M.D.   On: 12/23/2022 04:11    Medications Ordered in ED Medications - No data to display                                                                                                                                   Procedures Procedures  (including critical care time)  Medical Decision Making / ED Course  Click here for ABCD2, HEART and other calculators  Medical Decision Making Amount and/or Complexity of Data Reviewed Radiology: ordered and independent interpretation performed. Decision-making details documented in ED Course. ECG/medicine tests: ordered and independent interpretation performed. Decision-making  details documented in ED Course.    No acute distress. Lungs clear. CXR negative EKG nonischemic.         Final Clinical Impression(s) / ED Diagnoses Final diagnoses:  Malingering   The patient appears reasonably screened and/or stabilized for discharge and I doubt any other medical condition or other [facility] requiring further screening, evaluation, or treatment in the ED at this time. I have discussed the findings, Dx and Tx plan with the patient/family who expressed understanding and agree(s) with the plan. Discharge instructions discussed at length. The patient/family was given strict return precautions who verbalized understanding of the instructions. No further questions at time of discharge.  Disposition: Discharge  Condition: Good  ED Discharge Orders     None        Follow Up: [facility] Address: [address], [place], [place] [address] Hours: Open 24 hours Monday through Sunday Phone: [phone] Go to  as needed            This chart was dictated using voice recognition software.  Despite best efforts to proofread,  errors can occur which can change the documentation meaning.    Fatima Blank, MD 12/23/22 [id number]

## 2022-12-23 NOTE — ED Triage Notes (Signed)
Pt presents to [facility] voluntarily. Pt was non responsive to triage assessment. Pt denies SI/HI.

## 2022-12-23 NOTE — ED Provider Notes (Signed)
Behavioral Health Urgent Care Medical Screening Exam  Patient Name: [name] MRN: [id number] Date of Evaluation: 12/24/22 Chief Complaint:   Diagnosis:  Final diagnoses:  Malingering  Paranoid schizophrenia ([place])    History of Present illness: [name] is a 32 y.o. male with Psychiatric history significant for Paranoid Schizophrenia and Substance abuse.  Patient presented voluntarily to [facility] with chief complaint of "it cold, I need a place to sleep."  Patient was evaluated face-to-face and his chart was reviewed by this nurse practitioner.  On assessment, patient is noted to be sleeping on the chair in the assessment. He is arousable to verbal prompt, he is alert and oriented x 4. He is calm and minimally cooperative; he says he is wants to go to sleep and does not to participate in assessment. He speaks in a clear tone of voice with minimal eye contact. His mood is euthymic, thought process is coherent. No objective signs of psychosis, preoccupation, mania, or delusional thought content.  He denies suicidal ideation, homicidal ideation, hallucinations, and paranoia.  He is noted with a pus filled bump on his left eyebrow. He refuses to allow this NP to assess it. When asked if the area is painful, he responses "I don't know." He denies any medical or psychiatric need at this time.   Per chart, he is followed by [facility] Team and has an apartment in [place], [place].  Patient advised to follow-up with his ACT team.   Flowsheet Row ED from 12/23/2022 in [facility] Emergency Department at [facility] Most recent reading at 12/23/2022  5:46 PM ED from 12/23/2022 in [facility] Emergency Department at [facility] Most recent reading at 12/23/2022  2:56 AM ED from 12/22/2022 in [facility] Most recent reading at 12/22/2022  9:45 PM  C-SSRS RISK CATEGORY No Risk No Risk No Risk       Psychiatric Specialty Exam  Presentation  General  Appearance:Appropriate for Environment  Eye Contact:Good  Speech:Clear and Coherent  Speech Volume:Normal  Handedness:Right   Mood and Affect  Mood: Euthymic  Affect: Appropriate   Thought Process  Thought Processes: Coherent  Descriptions of Associations:Intact  Orientation:Full (Time, Place and Person)  Thought Content:WDL  Diagnosis of Schizophrenia or Schizoaffective disorder in past: Yes  Duration of Psychotic Symptoms: Greater than six months  Hallucinations:None denies AVH  Ideas of Reference:None  Suicidal Thoughts:No  Homicidal Thoughts:No   Sensorium  Memory: Immediate Fair; Remote Poor; Recent Poor  Judgment: Poor  Insight: Poor   Executive Functions  Concentration: Fair  Attention Span: Fair  Recall: Poor  Fund of Knowledge: Poor  Language: Fair   Psychomotor Activity  Psychomotor Activity: Normal   Assets  Assets: Physical Health; Resilience   Sleep  Sleep: Poor  Number of hours:  4   Physical Exam: Physical Exam Vitals and nursing note reviewed.  Constitutional:      General: He is not in acute distress.    Appearance: He is well-developed.  HENT:     Head: Normocephalic and atraumatic.  Eyes:     Conjunctiva/sclera: Conjunctivae normal.  Cardiovascular:     Rate and Rhythm: Normal rate.     Heart sounds: No murmur heard. Pulmonary:     Effort: Pulmonary effort is normal. No respiratory distress.  Abdominal:     Palpations: Abdomen is soft.     Tenderness: There is no abdominal tenderness.  Musculoskeletal:        General: No swelling.  Cervical back: Neck supple.  Skin:    General: Skin is warm and dry.     Capillary Refill: Capillary refill takes less than 2 seconds.  Neurological:     Mental Status: He is alert and oriented to person, place, and time.  Psychiatric:        Attention and Perception: Attention and perception normal.        Mood and Affect: Mood normal.        Speech:  Speech normal.        Behavior: Behavior is uncooperative.        Thought Content: Thought content normal.        Cognition and Memory: Cognition normal.    Review of Systems  Constitutional: Negative.   HENT: Negative.    Eyes: Negative.   Respiratory: Negative.    Cardiovascular: Negative.   Gastrointestinal: Negative.   Genitourinary: Negative.   Musculoskeletal: Negative.   Skin: Negative.   Neurological: Negative.   Endo/Heme/Allergies: Negative.   Psychiatric/Behavioral: Negative.     Blood pressure 115/75, pulse 80, temperature 98.4 F (36.9 C), temperature source Oral, resp. rate 20, SpO2 100 %. There is no height or weight on file to calculate BMI.  Musculoskeletal: Strength & Muscle Tone: within normal limits Gait & Station: normal Patient leans: Right   [place] MSE Discharge Disposition for Follow up and Recommendations: Based on my evaluation the patient does not appear to have an emergency medical condition and can be discharged with resources and follow up care in outpatient services for Medication Management and Individual Therapy   Ophelia Shoulder, NP 12/24/2022, 10:58 PM

## 2022-12-23 NOTE — ED Notes (Signed)
RN walked in to triage patient and he immediately stated "I want a albuterol with the face mask". This RN stated she was not a doctor and he would have to wait on them to see him. Patient does not appear to be having difficulty breathing. Patient has also removed all monitoring equipment and is asking for food.

## 2022-12-23 NOTE — ED Triage Notes (Signed)
Chief Complaint  Patient presents with   Shortness of Breath   Pt presents to ED 22 via EMS from home with above complaint.  Per report, pt endorses SOB/wheezing; states he ran out of his Albuterol.  EMS states pt refused to answer most questions but was able to speak in complete sentences when he would speak.  EMS states pt has history of behavioral concerns.

## 2022-12-29 ENCOUNTER — Ambulatory Visit (HOSPITAL_COMMUNITY)
Admission: EM | Admit: 2022-12-29 | Discharge: 2022-12-30 | Disposition: A | Discharge status: Home / Self Care | Payer: Medicaid Other | Attending: Urology | Admitting: Urology

## 2022-12-29 DIAGNOSIS — Z765 Malingerer [conscious simulation]: Secondary | ICD-10-CM | POA: Insufficient documentation

## 2022-12-29 NOTE — ED Triage Notes (Addendum)
Pt presents to [facility] voluntarily. Pt reports feeling tired. Pt denies SI/HI. Pt reports needing somewhere safe to sleep. Pt denied additional assessment questions. Pt is routine

## 2022-12-30 NOTE — ED Provider Notes (Signed)
Behavioral Health Urgent Care Medical Screening Exam  Patient Name: [name] MRN: [id number] Date of Evaluation: 12/30/22 Chief Complaint:   Diagnosis:  Final diagnoses:  Malingering    History of Present illness: [name] is a 32 y.o. male with psychiatric history of paranoid schizophrenia, substance abuse, and malingering.  Patient presented voluntarily to [facility] due to tiredness and requesting a place to sleep.  Patient was seen face-to-face and his chart was reviewed by this nurse practitioner.  On approach, patient is noted in wet clothing. He reports "its raining, I'm tired, I need to sleep." He refused to participate further in assessment and requested for the lights to be turned off so he can sleep. Patient denies psychiatric and medical complaints.  He is alert and oriented x 4, he is not in any apparent distress.  He is speaking in a normal tone of voice at a moderate rate with minimal eye contact.  Patient's mood is irritable with a congruent affect.  Patient did not appear to be responding to any internal/external stimuli.  He denies suicidal ideation, homicidal ideation, paranoia, hallucination, and substance abuse. Patient appears to be at his baseline.   Nursing staffs assisted patient with drying his cloths.   [place] ED from 12/29/2022 in [facility] ED from 12/23/2022 in [facility] Emergency Department at [place] ([place]) from 02/22/2021 in [place] No Risk No Risk No Risk       Psychiatric Specialty Exam  Presentation  General Appearance:Appropriate for Environment  Eye Contact:Good  Speech:Clear and Coherent  Speech Volume:Decreased  Handedness:Right   Mood and Affect  Mood: Euthymic  Affect: Congruent   Thought Process  Thought Processes: Coherent  Descriptions of Associations:Intact  Orientation:None  Thought  Content:WDL  Diagnosis of Schizophrenia or Schizoaffective disorder in past: Yes  Duration of Psychotic Symptoms: Greater than six months  Hallucinations:None denies AVH  Ideas of Reference:None  Suicidal Thoughts:No  Homicidal Thoughts:No   Sensorium  Memory: Immediate Fair; Recent Poor; Remote Poor  Judgment: Poor  Insight: Lacking   Executive Functions  Concentration: Fair  Attention Span: Fair  Recall: Poor  Fund of Knowledge: Poor  Language: Fair   Psychomotor Activity  Psychomotor Activity: Normal   Assets  Assets: Desire for Improvement; Physical Health   Sleep  Sleep: Poor  Number of hours:  2   Physical Exam: Physical Exam Vitals and nursing note reviewed.  Constitutional:      General: He is not in acute distress.    Appearance: He is well-developed.  HENT:     Head: Normocephalic and atraumatic.  Eyes:     Conjunctiva/sclera: Conjunctivae normal.  Cardiovascular:     Rate and Rhythm: Normal rate.  Pulmonary:     Effort: Pulmonary effort is normal. No respiratory distress.  Abdominal:     Palpations: Abdomen is soft.     Tenderness: There is no abdominal tenderness.  Musculoskeletal:        General: No swelling.     Cervical back: Neck supple.  Skin:    General: Skin is warm and dry.  Neurological:     Mental Status: He is alert and oriented to person, place, and time.     Motor: No weakness.     Gait: Gait normal.  Psychiatric:        Attention and Perception: Attention and perception normal.        Mood and Affect:  Flat affect: irritable mood.        Behavior: Behavior is uncooperative.        Thought Content: Thought content normal. Thought content is not paranoid or delusional. Thought content does not include homicidal or suicidal ideation. Thought content does not include homicidal or suicidal plan.    Review of Systems  Constitutional: Negative.   HENT: Negative.    Eyes: Negative.   Respiratory:  Negative.    Cardiovascular: Negative.   Gastrointestinal: Negative.   Genitourinary: Negative.   Musculoskeletal: Negative.   Skin: Negative.   Neurological: Negative.   Endo/Heme/Allergies: Negative.   Psychiatric/Behavioral:  Negative for hallucinations, substance abuse and suicidal ideas. The patient is nervous/anxious.    Blood pressure 124/83, pulse 87, temperature 98 F (36.7 C), resp. rate 18, SpO2 98 %. There is no height or weight on file to calculate BMI.  Musculoskeletal: Strength & Muscle Tone: within normal limits Gait & Station: normal Patient leans: Right   [place] MSE Discharge Disposition for Follow up and Recommendations: Based on my evaluation the patient does not appear to have an emergency medical condition and can be discharged with resources and follow up care in outpatient services for Medication Management and Individual Therapy   Ophelia Shoulder, NP 12/30/2022, 12:48 AM

## 2022-12-30 NOTE — Discharge Instructions (Signed)

## 2023-01-02 ENCOUNTER — Ambulatory Visit (HOSPITAL_COMMUNITY)
Admission: EM | Admit: 2023-01-02 | Discharge: 2023-01-02 | Disposition: A | Discharge status: Home / Self Care | Payer: Medicaid Other | Attending: Psychiatry | Admitting: Psychiatry

## 2023-01-02 DIAGNOSIS — F209 Schizophrenia, unspecified: Secondary | ICD-10-CM | POA: Insufficient documentation

## 2023-01-02 DIAGNOSIS — Z5900 Homelessness unspecified: Secondary | ICD-10-CM | POA: Insufficient documentation

## 2023-01-02 DIAGNOSIS — Z765 Malingerer [conscious simulation]: Secondary | ICD-10-CM | POA: Insufficient documentation

## 2023-01-02 NOTE — ED Provider Notes (Signed)
Behavioral Health Urgent Care Medical Screening Exam  Patient Name: [name] MRN: [id number] Date of Evaluation: 01/02/23 Chief Complaint:  I'm hungry and needs to sleep Diagnosis:  Final diagnoses:  Malingering  Schizophrenia, unspecified type ([place])    History of Present illness: [name] is a 32 y.o. male.  With a history of paranoid schizophrenia, malingering, homelessness, behavioral concern problem.  Presented to  [facility] via [facility].  According to the patient he is tired the need some rest and he is hungry needs some food to eat.  Face-to-face observation of patient, patient is alert and oriented x 4, speech is clear.  Patient answered all questions appropriately.  Patient denies SI, HI, AVH or paranoia.  According to patient he is hungry and he needs some rest.  Patient was given something to eat by nursing staff.  At this time patient does not seem to be influenced by external or internal stimuli.  Patient denies substance abuse, patient seemed to be at baseline.  Recommend discharge for patient to follow-up with outpatient services and ACT team  [place] ED from 01/02/2023 in [facility] ED from 12/29/2022 in [place] ([place]) from 02/22/2021 in [place] CATEGORY No Risk No Risk No Risk       Psychiatric Specialty Exam  Presentation  General Appearance:Casual  Eye Contact:Good  Speech:Clear and Coherent  Speech Volume:Normal  Handedness:Right   Mood and Affect  Mood: Anxious  Affect: Appropriate   Thought Process  Thought Processes: Coherent  Descriptions of Associations:Circumstantial  Orientation:Full (Time, Place and Person)  Thought Content:WDL  Diagnosis of Schizophrenia or Schizoaffective disorder in past: Yes  Duration of Psychotic Symptoms: Greater than six months  Hallucinations:None denies AVH  Ideas of  Reference:None  Suicidal Thoughts:No  Homicidal Thoughts:No   Sensorium  Memory: Immediate Fair  Judgment: Poor  Insight: Lacking   Executive Functions  Concentration: Fair  Attention Span: Fair  Recall: Poor  Fund of Knowledge: Fair  Language: Fair   Psychomotor Activity  Psychomotor Activity: Normal   Assets  Assets: Desire for Improvement; Housing; Resilience   Sleep  Sleep: Poor  Number of hours:  2   Physical Exam: Physical Exam HENT:     Head: Normocephalic.     Nose: Nose normal.  Cardiovascular:     Rate and Rhythm: Normal rate.  Pulmonary:     Effort: Pulmonary effort is normal.  Musculoskeletal:        General: Normal range of motion.     Cervical back: Normal range of motion.  Neurological:     General: No focal deficit present.     Mental Status: He is alert.  Psychiatric:        Mood and Affect: Mood normal.   Review of Systems  Constitutional: Negative.   HENT: Negative.    Eyes: Negative.   Respiratory: Negative.    Cardiovascular: Negative.   Genitourinary: Negative.   Musculoskeletal: Negative.   Skin: Negative.   Neurological: Negative.   Psychiatric/Behavioral:  The patient is nervous/anxious.    Blood pressure 124/86, pulse 67, temperature 98.9 F (37.2 C), temperature source Oral, resp. rate 18, SpO2 98 %. There is no height or weight on file to calculate BMI.  Musculoskeletal: Strength & Muscle Tone: within normal limits Gait & Station: normal Patient leans: N/A   [place] MSE Discharge Disposition for Follow up and Recommendations: Based on my evaluation the patient does not appear  to have an emergency medical condition and can be discharged with resources and follow up care in outpatient services for Medication Management and Group Therapy   Evette Georges, NP 01/02/2023, 10:28 PM

## 2023-01-02 NOTE — Discharge Instructions (Signed)
F/u with ACT team 

## 2023-01-02 NOTE — ED Triage Notes (Signed)
Pt presents to [facility] voluntarily, via Event organ[facility]. Pt reports being tired and needs to sleep. Pt denies SI, HI, AVH and substance/alcohol use.

## 2023-01-05 ENCOUNTER — Ambulatory Visit (HOSPITAL_COMMUNITY)
Admission: EM | Admit: 2023-01-05 | Discharge: 2023-01-05 | Disposition: A | Discharge status: Home / Self Care | Payer: Medicaid Other | Attending: Mental Health | Admitting: Mental Health

## 2023-01-05 DIAGNOSIS — F200 Paranoid schizophrenia: Secondary | ICD-10-CM | POA: Diagnosis not present

## 2023-01-05 DIAGNOSIS — F32A Depression, unspecified: Secondary | ICD-10-CM | POA: Diagnosis not present

## 2023-01-05 DIAGNOSIS — Z5900 Homelessness unspecified: Secondary | ICD-10-CM | POA: Insufficient documentation

## 2023-01-05 NOTE — Progress Notes (Signed)
   01/05/23 2138  Patient Reported Information  How Did You Hear About [place]? Legal System  What Is the Reason for Your Visit/Call Today? Pt presents voluntary and unaccompanied to [facility], wanting to take a shower. Pt was a poor historian during the assessment and answered few questions. Pt denies, SI, HI, AVH, self-injurious behaviors and access to weapons.  How Long Has This Been Causing You Problems? > than 6 months  What Do You Feel Would Help You the Most Today? Housing Assistance  Have You Recently Had Any Thoughts About Hurting Yourself? No  Are You Planning to Commit Suicide/Harm Yourself At This time? No  Have you Recently Had Thoughts About [place]? No  Are You Planning To Harm Someone At This Time? No  Explanation: Pt denies, HI.  Have You Used Any Alcohol or Drugs in the Past 24 Hours? No  What Did You Use and How Much? Pt denies, substance use.  Do You Currently Have a Therapist/Psychiatrist? No  Name of Therapist/Psychiatrist Pt denies, being linked to a outpatient resources.  Have You Been Recently Discharged From Any Office Practice or Programs? Yes  Explanation of Discharge From Practice/Program Pt had an ACT Team however pt is unsure if his services are still in place.  [facility] Screening Triage Referral Assessment  Type of Contact Face-to-Face  Location of Assessment [facility] Assessment Services  Provider location [facility] Assessment Services  Collateral Involvement Pt consented for staff to call his payee [name]) however he does not know her contact information.  Does Patient Have a Stage manager Guardian? No  Legal Guardian Contact Information Pt is his own guardian.  Copy of Legal Guardianship Form in Chart No - copy requested  Legal Guardian Notified of Arrival   (Pt is his own guardian.)  Legal Guardian Notified of Pending Discharge   (Pt is his own guardian.)  If Minor and Not Living with Parent(s), Who has Custody? Pt is an adult, pt is his own  guardian.  Is CPS involved or ever been involved? Never  Is APS involved or ever been involved? Never  Patient Determined To Be At Risk for Harm To Self or Others Based on Review of Patient Reported Information or Presenting Complaint? No  Method No Plan  Availability of Means No access or NA  Intent Vague intent or NA  Notification Required No need or identified person  Additional Information for Danger to Others Potential  (Pt denies, HI.)  Additional Comments for Danger to Others Potential Pt denies, HI.  Are There Guns or Other Weapons in [place]? No  Types of Guns/Weapons Pt denies, access to weapons.  Are These Weapons Safely Secured?  (Pt denies, HI.)  Who Could Verify You Are Able To Have These Secured: Pt denies, HI.  Do You Have any Outstanding Charges, Pending Court Dates, Parole/Probation? Pt denies, legal involvement.  Contacted To Inform of Risk of Harm To Self or Others: Other: Comment (None.)  Does Patient Present under Involuntary Commitment? No  [place] of Residence [place]  Patient Currently Receiving the Following Services: Not Receiving Services  Determination of Need Routine (7 days)  Options For Referral Medication Management;Outpatient Therapy    Determination of need: Routine.    Vertell Novak, [place], [facility], [facility] Triage Specialist [phone]

## 2023-01-05 NOTE — BH Assessment (Signed)
Comprehensive Clinical Assessment (CCA) Note  01/05/2023 [name] [id number]  Disposition: Quintella Reichert, NP recommends pt is psych cleared.   The patient demonstrates the following risk factors for suicide: Chronic risk factors for suicide include: psychiatric disorder of Paranoid Schizophrenia ([name]) . Acute risk factors for suicide include: N/A. Protective factors for this patient include:  Pt denies, SI, pt can contract for safety . Considering these factors, the overall suicide risk at this point appears to be no risk. Patient is appropriate for outpatient follow up.  [name] is a 32 year old male who presents voluntary and unaccompanied to [facility]. Clinician asked the pt, "what brought you to the hospital?" Pt reports, "I don't know." Pt reports, he was in [place] called 911 to bring him to [facility] but is not sure why. Pt then asked if he can take a shower. Pt reports, he has an apartment in [facility] but doesn't know where he keys are. Pt reports, he wants to go to a mental health hospital. Clinician asked the pt why he wants to go, pt replied, "I don't know." Pt denies, SI, HI, AVH, self-injurious behaviors and access to weapons.   Pt denies, substance use. Pt denies, being linked to OPT resources (medication management and/or counseling.) Pt was linked to an ACT Team but hasn't seen them. Pt is unsure the last time he received his Invega shot.   Pt was guarded, a poor historian during the assessment and answered few questions. Pt's mood was euthymic. Pt's affect was congruent. Pt's insight, judgement was poor. Pt reports, if discharged he can contract for safety.    *Initially pt declined for staff to contact anyone. Pt then consented for staff to call his payee [name]) however he does not know her contact information. Pt denies, having a cell phone. Contact was not made.*  Chief Complaint: No chief complaint on file.  Visit Diagnosis:  Paranoid Schizophrenia ([name])     CCA Screening, Triage and Referral (STR)  Patient Reported Information How did you hear about [place]? Legal System  What Is the Reason for Your Visit/Call Today? Pt presents voluntary and unaccompanied to [facility], wanting to take a shower. Pt was a poor historian during the assessment and answered few questions. Pt denies, SI, HI, AVH, self-injurious behaviors and access to weapons.  How Long Has This Been Causing You Problems? > than 6 months  What Do You Feel Would Help You the Most Today? Housing Assistance   Have You Recently Had Any Thoughts About Hurting Yourself? No  Are You Planning to Commit Suicide/Harm Yourself At This time? No   Flowsheet Row ED from 01/05/2023 in [facility] from 01/02/2023 in [place] [facility] from 02/22/2021 in [place] No Risk No Risk No Risk       Have you Recently Had Thoughts About [place]? No  Are You Planning to Harm Someone at This Time? No  Explanation: Pt denies, HI.   Have You Used Any Alcohol or Drugs in the Past 24 Hours? No  What Did You Use and How Much? Pt denies, substance use.   Do You Currently Have a Therapist/Psychiatrist? No  Name of Therapist/Psychiatrist: Name of Therapist/Psychiatrist: Pt denies, being linked to a outpatient resources.   Have You Been Recently Discharged From Any Office Practice or Programs? Yes  Explanation of Discharge From Practice/Program: Pt had an ACT Team however pt is unsure  if his services are still in place.     CCA Screening Triage Referral Assessment Type of Contact: Face-to-Face  Telemedicine Service Delivery:   Is this Initial or Reassessment?   Date Telepsych consult ordered in [facility]:    Time Telepsych consult ordered in [facility]:    [facility] Assessment[facility]  Provider Location: [facility] Assessment  Services   Collateral Involvement: Pt consented for staff to call his payee [name]) however he does not know her contact information.   Does Patient Have a Stage manager Guardian? No  Legal Guardian Contact Information: Pt is his own guardian.  Copy of Legal Guardianship Form: No - copy requested  Legal Guardian Notified of Arrival: -- (Pt is his own guardian.)  Legal Guardian Notified of Pending Discharge: -- (Pt is his own guardian.)  If Minor and Not Living with Parent(s), Who has Custody? Pt is an adult, pt is his own guardian.  Is CPS involved or ever been involved? Never  Is APS involved or ever been involved? Never   Patient Determined To Be At Risk for Harm To Self or Others Based on Review of Patient Reported Information or Presenting Complaint? No  Method: No Plan  Availability of Means: No access or NA  Intent: Vague intent or NA  Notification Required: No need or identified person  Additional Information for Danger to Others Potential: -- (Pt denies, HI.)  Additional Comments for Danger to Others Potential: Pt denies, HI.  Are There Guns or Other Weapons in [place]? No  Types of Guns/Weapons: Pt denies, access to weapons.  Are These Weapons Safely Secured?                            -- (Pt denies, HI.)  Who Could Verify You Are Able To Have These Secured: Pt denies, HI.  Do You Have any Outstanding Charges, Pending Court Dates, Parole/Probation? Pt denies, legal involvement.  Contacted To Inform of Risk of Harm To Self or Others: Other: Comment (None.)    Does Patient Present under Involuntary Commitment? No    [place] of Residence: [place]   Patient Currently Receiving the Following Services: Not Receiving Services   Determination of Need: Routine (7 days)   Options For Referral: Medication Management; Outpatient Therapy     CCA Biopsychosocial Patient Reported Schizophrenia/Schizoaffective Diagnosis in Past:  No   Strengths: Pt is wanting help.   Mental Health Symptoms Depression:   None   Duration of Depressive symptoms:    Mania:   None   Anxiety:    None   Psychosis:   None   Duration of Psychotic symptoms:    Trauma:   None   Obsessions:   None   Compulsions:   None   Inattention:   None   Hyperactivity/Impulsivity:   None   Oppositional/Defiant Behaviors:   None   Emotional Irregularity:   None   Other Mood/Personality Symptoms:   None.    Mental Status Exam Appearance and self-care  Stature:   Average   Weight:   Average weight   Clothing:   Disheveled   Grooming:   Normal   Cosmetic use:   None   Posture/gait:   Normal   Motor activity:   Not Remarkable   Sensorium  Attention:   Normal   Concentration:   Normal   Orientation:   X5   Recall/memory:   Normal   Affect  and Mood  Affect:   Congruent   Mood:   Euthymic   Relating  Eye contact:   -- (Fair.)   Facial expression:   Responsive   Attitude toward examiner:   Guarded   Thought and Language  Speech flow:  Normal   Thought content:   Appropriate to Mood and Circumstances   Preoccupation:   None   Hallucinations:   None   Organization:   Circumstantial   Transport planner of Knowledge:   Poor   Intelligence:   Average   Abstraction:   Functional   Judgement:   Poor   Reality Testing:   Adequate   Insight:   Poor   Decision Making:   Impulsive   Social Functioning  Social Maturity:   Impulsive   Social Judgement:   "Street Smart"   Stress  Stressors:   Other (Comment) (Pt denies, stressors.)   Coping Ability:   Deficient supports   Skill Deficits:   Communication; Decision making; Interpersonal; Self-care; Responsibility   Supports:   Support needed     Religion: Religion/Spirituality Are You A Religious Person?:  (Pt reports, "I don't know.") How Might This Affect Treatment?:  None.  Leisure/Recreation: Leisure / Recreation Do You Have Hobbies?:  (Pt reports, "I don't know.")  Exercise/Diet: Exercise/Diet Do You Exercise?:  (Pt reports, "I don't know.") Have You Gained or Lost A Significant Amount of Weight in the Past Six Months?:  (Pt reports, "I don't know.") Do You Follow a Special Diet?:  (Pt reports, "I don't know.") Do You Have Any Trouble Sleeping?: No   CCA Employment/Education Employment/Work Situation: Employment / Work Technical sales engineer: On disability Why is Patient on Disability: Pt reports, "I don't know." How Long has Patient Been on Disability: Pt reports, "I don't know." Patient's Job has Been Impacted by Current Illness: No Has Patient ever Been in the [employer]?: No  Education: Education Is Patient Currently Attending School?: No Last Grade Completed: 9 Did You Attend College?: No Did You Have An Individualized Education Program (IIEP): No Did You Have Any Difficulty At School?: No Patient's Education Has Been Impacted by Current Illness: No   CCA Family/Childhood History Family and Relationship History: Family history Marital status: Single Does patient have children?: No  Childhood History:  Childhood History By whom was/is the patient raised?: Mother Did patient suffer any verbal/emotional/physical/sexual abuse as a child?: No Did patient suffer from severe childhood neglect?: No Has patient ever been sexually abused/assaulted/raped as an adolescent or adult?: No Was the patient ever a victim of a crime or a disaster?: No Witnessed domestic violence?:  (Pt reports, "I don't know.") Has patient been affected by domestic violence as an adult?: No       CCA Substance Use Alcohol/Drug Use: Alcohol / Drug Use Pain Medications: See MAR Prescriptions: See MAR Over the Counter: See MAR History of alcohol / drug use?: No history of alcohol / drug abuse Longest period of sobriety (when/how long): Pt  denies, substance use. Negative Consequences of Use:  (Pt denies, substance use.) Withdrawal Symptoms: None    ASAM's:  Six Dimensions of Multidimensional Assessment  Dimension 1:  Acute Intoxication and/or Withdrawal Potential:   Dimension 1:  Description of individual's past and current experiences of substance use and withdrawal: Pt denies, substance use.  Dimension 2:  Biomedical Conditions and Complications:   Dimension 2:  Description of patient's biomedical conditions and  complications: Pt denies, substance use.  Dimension 3:  Emotional, Behavioral,  or Cognitive Conditions and Complications:  Dimension 3:  Description of emotional, behavioral, or cognitive conditions and complications: Pt denies, substance use.  Dimension 4:  Readiness to Change:  Dimension 4:  Description of Readiness to Change criteria: Pt denies, substance use.  Dimension 5:  Relapse, Continued use, or Continued Problem Potential:  Dimension 5:  Relapse, continued use, or continued problem potential critiera description: Pt denies, substance use.  Dimension 6:  Recovery/Living Environment:  Dimension 6:  Recovery/Iiving environment criteria description: Pt denies, substance use.  ASAM Severity Score: ASAM's Severity Rating Score: 0  ASAM Recommended Level of Treatment: ASAM Recommended Level of Treatment:  (Pt denies, substance use.)   Substance use Disorder (SUD) Substance Use Disorder (SUD)  Checklist Symptoms of Substance Use:  (Pt denies, substance use.)  Recommendations for Services/Supports/Treatments: Recommendations for Services/Supports/Treatments Recommendations For Services/Supports/Treatments: Individual Therapy, Medication Management  Discharge Disposition: Discharge Disposition Medical Exam completed: Yes  DSM5 Diagnoses: Patient Active Problem List   Diagnosis Date Noted   Malingering 05/24/2021   Homelessness 05/24/2021   Alcohol abuse 03/21/2021   Substance-induced disorder ([place])  01/02/2021   Schizophrenia, paranoid type ([place]) 10/24/2020   Paranoid schizophrenia ([place]) 01/05/2020   Other schizophrenia ([place]) 02/14/2018   Cannabis use disorder, moderate, dependence ([place]) 05/10/2016     Referrals to Alternative Service(s): Referred to Alternative Service(s):   Place:   Date:   Time:    Referred to Alternative Service(s):   Place:   Date:   Time:    Referred to Alternative Service(s):   Place:   Date:   Time:    Referred to Alternative Service(s):   Place:   Date:   Time:     Vertell Novak, [facility] Comprehensive Clinical Assessment (CCA) Screening, Triage and Referral Note  01/05/2023 Marcelino Pellecchia [id number]  Chief Complaint: No chief complaint on file.  Visit Diagnosis:   Patient Reported Information How did you hear about [place]? Legal System  What Is the Reason for Your Visit/Call Today? Pt presents voluntary and unaccompanied to [facility], wanting to take a shower. Pt was a poor historian during the assessment and answered few questions. Pt denies, SI, HI, AVH, self-injurious behaviors and access to weapons.  How Long Has This Been Causing You Problems? > than 6 months  What Do You Feel Would Help You the Most Today? Housing Assistance   Have You Recently Had Any Thoughts About Hurting Yourself? No  Are You Planning to Commit Suicide/Harm Yourself At This time? No   Have you Recently Had Thoughts About [place]? No  Are You Planning to Harm Someone at This Time? No  Explanation: Pt denies, HI.   Have You Used Any Alcohol or Drugs in the Past 24 Hours? No  How Long Ago Did You Use Drugs or Alcohol? Pt denies, substance use. What Did You Use and How Much? Pt denies, substance use.   Do You Currently Have a Therapist/Psychiatrist? No  Name of Therapist/Psychiatrist: Pt denies, being linked to a outpatient resources.   Have You Been Recently Discharged From Any Office Practice or Programs? Yes  Explanation of Discharge From  Practice/Program: Pt had an ACT Team however pt is unsure if his services are still in place.    CCA Screening Triage Referral Assessment Type of Contact: Face-to-Face  Telemedicine Service Delivery:   Is this Initial or Reassessment?   Date Telepsych consult ordered in [facility]:    Time Telepsych consult ordered in [facility]:    [facility] of Assessment: [facility]  Provider Location: [facility] Assessment Services    Collateral Involvement: Pt consented for staff to call his payee [name]) however he does not know her contact information.   Does Patient Have a Stage manager Guardian? No. Name and Contact of Legal Guardian: Pt is his own guardian. If Minor and Not Living with Parent(s), Who has Custody? Pt is an adult, pt is his own guardian.  Is CPS involved or ever been involved? Never  Is APS involved or ever been involved? Never   Patient Determined To Be At Risk for Harm To Self or Others Based on Review of Patient Reported Information or Presenting Complaint? No  Method: No Plan  Availability of Means: No access or NA  Intent: Vague intent or NA  Notification Required: No need or identified person  Additional Information for Danger to Others Potential: -- (Pt denies, HI.)  Additional Comments for Danger to Others Potential: Pt denies, HI.  Are There Guns or Other Weapons in [place]? No  Types of Guns/Weapons: Pt denies, access to weapons.  Are These Weapons Safely Secured?                            -- (Pt denies, HI.)  Who Could Verify You Are Able To Have These Secured: Pt denies, HI.  Do You Have any Outstanding Charges, Pending Court Dates, Parole/Probation? Pt denies, legal involvement.  Contacted To Inform of Risk of Harm To Self or Others: Other: Comment (None.)   Does Patient Present under Involuntary Commitment? No    [place] of Residence: [place]   Patient Currently Receiving the Following Services: Not Receiving  Services   Determination of Need: Routine (7 days)   Options For Referral: Medication Management; Outpatient Therapy   Discharge Disposition:  Discharge Disposition Medical Exam completed: Yes  Vertell Novak, [place], [place], [facility], [facility] Triage Specialist [phone]

## 2023-01-05 NOTE — ED Provider Notes (Signed)
Behavioral Health Urgent Care Medical Screening Exam  Patient Name: [name] MRN: [id number] Date of Evaluation: 01/06/23 Chief Complaint:  I want to take a shower Diagnosis:  Final diagnoses:  Paranoid schizophrenia ([place])    History of Present illness: [name] is a 32 y.o. male with a psychiatric history of paranoid schizophrenia, malingering, homelessness and substance abuse presenting voluntarily to [facility] via [facility] for psychiatric evaluation.    Patient was evaluated face to face and is chart was reviewed by this nurse practitioner. On evaluation patient is lying on the bench in the assessment room. NP is very familiar with this patient as he had 20 ED visits in the last 6 months with a similar presentation. Patient states that he was on [employer] and he called [employer] because he wanted some help. When asked what type of help patient needed, patient responded, "I don't know". Patient responded I do not know to most questions asked during the assessment. Patient denied having any suicidal ideations, homicidal ideations, auditory or visual hallucinations. Patient any anxiety, depression, paranoia, delusions or substance use. Patient initially stated that he was homeless, but later stated that he has an apartment in [place] but he does not have his key to his apartment. Patient asked if he could take a shower. Patient denies being on any medications or being followed by any outpatient mental health provider.  Patient is alert oriented x3, is calm and cooperative, mood is euthymic with a flat, blunted affect,  speech is clear but tangential. Patient appears to be at his baseline of functioning and not in any distress. Patient does not appear to be responding to any internal or external stimuli at this time and does not appear to be a danger to himself or others. Patient does not meet criteria at this time and will discharged to follow up with outpatient community  resources.     [place] ED from 01/05/2023 in [facility] ED from 01/02/2023 in [facility] ED from 12/29/2022 in [place] No Risk No Risk No Risk       Psychiatric Specialty Exam  Presentation  General Appearance:Disheveled  Eye Contact:Good  Speech:Clear and Coherent  Speech Volume:Normal  Handedness:Right   Mood and Affect  Mood: Euthymic  Affect: Appropriate   Thought Process  Thought Processes: Coherent  Descriptions of Associations:Intact  Orientation:Full (Time, Place and Person)  Thought Content:WDL  Diagnosis of Schizophrenia or Schizoaffective disorder in past: Yes  Duration of Psychotic Symptoms: Greater than six months  Hallucinations:None denies AVH  Ideas of Reference:None  Suicidal Thoughts:No  Homicidal Thoughts:No   Sensorium  Memory: Immediate Poor; Recent Poor; Remote Fair  Judgment: Poor  Insight: Poor   Executive Functions  Concentration: Poor  Attention Span: Poor  Recall: Poor  Fund of Knowledge: Poor  Language: Good   Psychomotor Activity  Psychomotor Activity: Normal   Assets  Assets: Communication Skills   Sleep  Sleep: Fair  Number of hours:  2   Physical Exam: Physical Exam HENT:     Head: Normocephalic.     Nose: Nose normal.  Eyes:     Pupils: Pupils are equal, round, and reactive to light.  Cardiovascular:     Rate and Rhythm: Normal rate.  Pulmonary:     Effort: Pulmonary effort is normal.  Abdominal:     General: Abdomen is flat.  Musculoskeletal:        General:  Normal range of motion.     Cervical back: Normal range of motion.  Skin:    General: Skin is warm.  Neurological:     Mental Status: He is alert. Mental status is at baseline.  Psychiatric:        Attention and Perception: Attention normal.        Mood and Affect: Affect is blunt and flat.         Speech: Speech is tangential.        Behavior: Behavior is cooperative.        Thought Content: Thought content is not paranoid or delusional. Thought content does not include homicidal or suicidal ideation. Thought content does not include homicidal or suicidal plan.        Cognition and Memory: Cognition normal.        Judgment: Judgment is impulsive.    Review of Systems  Constitutional: Negative.   HENT: Negative.    Eyes: Negative.   Respiratory: Negative.    Cardiovascular: Negative.   Gastrointestinal: Negative.   Genitourinary: Negative.   Musculoskeletal: Negative.   Skin: Negative.   Neurological: Negative.   Endo/Heme/Allergies: Negative.   Psychiatric/Behavioral:  The patient is nervous/anxious.    There were no vitals taken for this visit. There is no height or weight on file to calculate BMI.  Musculoskeletal: Strength & Muscle Tone: within normal limits Gait & Station: normal Patient leans: N/A   [place] MSE Discharge Disposition for Follow up and Recommendations: Based on my evaluation the patient does not appear to have an emergency medical condition and can be discharged with resources and follow up care in outpatient services for Medication Management and Individual Therapy   Lucia Bitter, NP 01/06/2023, 12:14 AM

## 2023-01-05 NOTE — Discharge Instructions (Signed)

## 2023-01-06 ENCOUNTER — Encounter (HOSPITAL_COMMUNITY): Payer: Self-pay

## 2023-01-06 ENCOUNTER — Other Ambulatory Visit: Payer: Self-pay

## 2023-01-06 ENCOUNTER — Emergency Department (HOSPITAL_COMMUNITY)
Admission: EM | Admit: 2023-01-06 | Discharge: 2023-01-07 | Disposition: A | Discharge status: Home / Self Care | Payer: Medicaid Other | Attending: Emergency Medicine | Admitting: Emergency Medicine

## 2023-01-06 ENCOUNTER — Ambulatory Visit (HOSPITAL_COMMUNITY)
Admission: EM | Admit: 2023-01-06 | Discharge: 2023-01-06 | Disposition: A | Discharge status: Home / Self Care | Payer: Medicaid Other

## 2023-01-06 DIAGNOSIS — Z765 Malingerer [conscious simulation]: Secondary | ICD-10-CM

## 2023-01-06 DIAGNOSIS — F17210 Nicotine dependence, cigarettes, uncomplicated: Secondary | ICD-10-CM | POA: Insufficient documentation

## 2023-01-06 DIAGNOSIS — H538 Other visual disturbances: Secondary | ICD-10-CM | POA: Diagnosis present

## 2023-01-06 DIAGNOSIS — F200 Paranoid schizophrenia: Secondary | ICD-10-CM

## 2023-01-06 NOTE — ED Notes (Signed)
This is pt second time coming tonight he was given food and drinks. Refused AVS and was walked out of lobby doors

## 2023-01-06 NOTE — ED Provider Notes (Signed)
Behavioral Health Urgent Care Medical Screening Exam  Patient Name: [name] MRN: [id number] Date of Evaluation: 01/06/23 Chief Complaint:  I want to go sleep Diagnosis:  Final diagnoses:  Paranoid schizophrenia ([place])  Malingering    History of Present illness: [name] is a 32 y.o. male. with a psychiatric history of paranoid schizophrenia, malingering, homelessness and substance abuse presenting voluntarily to [facility] walk in clinic requesting somewhere to sleep. Patient presented earlier this evening with similar complaint of requesting a shower. Patient denies any SI/HI or AVH at this time.  Patient was evaluated face to face and chart was reviewed by this nurse practitioner. Patient was alert oriented x3,  NP is very familiar with this patient as he had 21 ED visits in the last 6 months with a similar presentation. Patient states that he is tired and sleepy and does not have any place to go. Patient denies any paranoia, delusions or substance use SI/HI or AVH. Patient is calm and cooperative, mood is euthymic, affect is flat and blunted. Patient does not seem to be responding to any internal or external stimuli at this time.   Patient does not meet criteria for inpatient treatment. Patient will be discharged and instructed to follow up with community resources.   [place] ED from 01/06/2023 in [facility] ED from 01/05/2023 in [place] ([place]) from 02/22/2021 in [place] No Risk No Risk No Risk       Psychiatric Specialty Exam  Presentation  General Appearance:Disheveled  Eye Contact:Fair  Speech:Clear and Coherent  Speech Volume:Normal  Handedness:Right   Mood and Affect  Mood: Euthymic  Affect: Flat   Thought Process  Thought Processes: Coherent  Descriptions of Associations:Intact  Orientation:Full (Time, Place and  Person)  Thought Content:WDL  Diagnosis of Schizophrenia or Schizoaffective disorder in past: Yes  Duration of Psychotic Symptoms: Greater than six months  Hallucinations:None denies AVH  Ideas of Reference:None  Suicidal Thoughts:No  Homicidal Thoughts:No   Sensorium  Memory: Immediate Fair; Recent Fair; Remote Fair  Judgment: Poor  Insight: Poor   Executive Functions  Concentration: Poor  Attention Span: Fair  Recall: Poor  Fund of Knowledge: Poor  Language: Good   Psychomotor Activity  Psychomotor Activity: Normal   Assets  Assets: Physical Health; Resilience   Sleep  Sleep: Fair  Number of hours:  -1   Physical Exam: Physical Exam HENT:     Head: Normocephalic.     Nose: Nose normal.  Eyes:     Pupils: Pupils are equal, round, and reactive to light.  Cardiovascular:     Rate and Rhythm: Normal rate.  Pulmonary:     Effort: Pulmonary effort is normal.  Abdominal:     General: Abdomen is flat.  Musculoskeletal:        General: Normal range of motion.     Cervical back: Normal range of motion.  Skin:    General: Skin is warm.  Neurological:     Mental Status: He is alert and oriented to person, place, and time.  Psychiatric:        Attention and Perception: Attention normal.        Mood and Affect: Affect is blunt and flat.        Speech: Speech is tangential.        Behavior: Behavior normal.        Thought Content: Thought content does not include homicidal  or suicidal ideation. Thought content does not include homicidal or suicidal plan.        Cognition and Memory: Cognition normal.        Judgment: Judgment is impulsive.    Review of Systems  Constitutional: Negative.   HENT: Negative.    Eyes: Negative.   Respiratory: Negative.    Cardiovascular: Negative.   Gastrointestinal: Negative.   Genitourinary: Negative.   Musculoskeletal: Negative.   Skin: Negative.   Neurological: Negative.   Endo/Heme/Allergies:  Negative.   Psychiatric/Behavioral:  Positive for depression.    Blood pressure 120/86, pulse 62, temperature 98.1 F (36.7 C), temperature source Oral, resp. rate 18, SpO2 100 %. There is no height or weight on file to calculate BMI.  Musculoskeletal: Strength & Muscle Tone: within normal limits Gait & Station: normal Patient leans: N/A   [place] MSE Discharge Disposition for Follow up and Recommendations: Based on my evaluation the patient does not appear to have an emergency medical condition and can be discharged with resources and follow up care in outpatient services for Medication Management and Individual Therapy   Lucia Bitter, NP 01/06/2023, 7:44 AM

## 2023-01-06 NOTE — Discharge Instructions (Signed)

## 2023-01-06 NOTE — Progress Notes (Signed)
01/06/23 0423  Patient Reported Information  How Did You Hear About [place]? Self  What Is the Reason for Your Visit/Call Today? Pt was assessed and discharged from [facility] earlier. Pt returned expressing he was sleepy but wouldn't provide additional information. When clincian asked additonal questions pt replied, "I don't know." Pt denies, being linked to linked to OPT resources (medication management and/or counseling.) Pt denies, SI, HI, AVH, self-injurious behaviors and access to weapons. Pt reports, if discharged he can contract for safety.  How Long Has This Been Causing You Problems? > than 6 months  What Do You Feel Would Help You the Most Today? Housing Assistance  Have You Recently Had Any Thoughts About Hurting Yourself? No  Are You Planning to Commit Suicide/Harm Yourself At This time? No  Have you Recently Had Thoughts About [place]? No  Are You Planning To Harm Someone At This Time? No  Explanation: Pt denies, HI.  Have You Used Any Alcohol or Drugs in the Past 24 Hours? No  What Did You Use and How Much? Pt denies, substance use.  Do You Currently Have a Therapist/Psychiatrist? No  Name of Therapist/Psychiatrist Pt denies, being linked to outpatient resources.  Have You Been Recently Discharged From Any Office Practice or Programs? Yes  Explanation of Discharge From Practice/Program Pt was linked to an ACT Team for outpatient treatment pt denies taking medications. Pt has previous admission to [facility] and EDs.  CCA Screening Triage Referral Assessment  Type of Contact Face-to-Face  Location of Assessment [facility] Assessment Services  Provider location [facility] Assessment Services  Collateral Involvement None.  Does Patient Have a Stage manager Guardian? No  Legal Guardian Contact Information Pt is his own guardian.  Copy of Legal Guardianship Form in Chart No - copy requested  Legal Guardian Notified of Arrival   (Pt is his own guardian.)  Legal Guardian  Notified of Pending Discharge   (Pt is his own guardian.)  If Minor and Not Living with Parent(s), Who has Custody? Pt is an adult, pt is his own guardian  Is CPS involved or ever been involved? Never  Is APS involved or ever been involved? Never  Patient Determined To Be At Risk for Harm To Self or Others Based on Review of Patient Reported Information or Presenting Complaint? No  Method No Plan  Availability of Means No access or NA  Intent Vague intent or NA  Notification Required No need or identified person  Additional Information for Danger to Others Potential  (Pt denies, HI.)  Additional Comments for Danger to Others Potential Pt denies, HI.  Are There Guns or Other Weapons in [place]? No  Types of Guns/Weapons Pt denies, access to weapons.  Are These Weapons Safely Secured? No  Who Could Verify You Are Able To Have These Secured: Pt denies, access to weapons.  Do You Have any Outstanding Charges, Pending Court Dates, Parole/Probation? Pt denies, legal involvement.  Contacted To Inform of Risk of Harm To Self or Others: Other: Comment (None.)  Does Patient Present under Involuntary Commitment? No  [place] of Residence [place]  Patient Currently Receiving the Following Services: Not Receiving Services  Determination of Need Routine (7 days)  Options For Referral [facility] Urgent Care;Mobile Crisis;Medication Management;Outpatient Therapy    Flowsheet Row ED from 01/06/2023 in [facility] ED from 01/05/2023 in [place] ([place]) from 02/22/2021 in [place] No  Risk No Risk No Risk      Determination of need: Routine.    Vertell Novak, [place], [facility], [facility] Triage Specialist [phone]

## 2023-01-06 NOTE — ED Triage Notes (Signed)
Pt [place] station via [facility] c/o blurred close vision, not far away. Pt does not know when it started. He states also that he needs a chill pill. Per ems on scene pt stated that he was stressed and needed to be seen. Pt refuses to talk much.

## 2023-01-07 NOTE — ED Provider Notes (Signed)
[facility] Emergency Department Provider Note MRN:  [id number]  Arrival date & time: 01/07/23     Chief Complaint   Blurred Vision   History of Present Illness   [name] is a 32 y.o. year-old male with a history of schizophrenia, depression presenting to the ED with chief complaint of vision.  Patient complained of blurred vision in triage.  Patient refusing to elaborate, refusing to participate in exam.  Does not want to talk anymore.  Review of Systems  A thorough review of systems was obtained and all systems are negative except as noted in the HPI and PMH.   Patient's Health History    Past Medical History:  Diagnosis Date   Depression    Schizophrenia [facility])     Past Surgical History:  Procedure Laterality Date   BACK SURGERY      History reviewed. No pertinent family history.  Social History   Socioeconomic History   Marital status: Single    Spouse name: Not on file   Number of children: Not on file   Years of education: Not on file   Highest education level: Not on file  Occupational History   Occupation: unemployed  Tobacco Use   Smoking status: Some Days    Types: Cigarettes   Smokeless tobacco: Never  Vaping Use   Vaping Use: Never used  Substance and Sexual Activity   Alcohol use: No   Drug use: Yes    Types: Marijuana    Comment: rare   Sexual activity: Yes    Birth control/protection: Condom  Other Topics Concern   Not on file  Social History Narrative   ** Merged History Encounter **       ** Merged History Encounter **       UTA certain topics - tangential thought process, cannot answer clearly. Pt not sure about school; reportedly is homeless.    Social Determinants of Health   Financial Resource Strain: Not on file  Food Insecurity: Not on file  Transportation Needs: Not on file  Physical Activity: Not on file  Stress: Not on file  Social Connections: Not on file  Intimate Partner Violence: Not on  file     Physical Exam   Vitals:   01/06/23 2338 01/06/23 2341  BP: 121/72   Pulse: 76   Resp: 15   Temp: 98.3 F (36.8 C)   SpO2: 96% 96%    CONSTITUTIONAL: Chronically ill-appearing, NAD NEURO/PSYCH:  Alert and oriented x 3, no focal deficits EYES:  eyes equal and reactive ENT/NECK:  no LAD, no JVD CARDIO: Regular rate, well-perfused, normal S1 and S2 PULM:  CTAB no wheezing or rhonchi GI/GU:  non-distended, non-tender MSK/SPINE:  No gross deformities, no edema SKIN:  no rash, atraumatic   *Additional and/or pertinent findings included in MDM below  Diagnostic and Interventional Summary    EKG Interpretation  Date/Time:    Ventricular Rate:    PR Interval:    QRS Duration:   QT Interval:    QTC Calculation:   R Axis:     Text Interpretation:         Labs Reviewed - No data to display  No orders to display    Medications - No data to display   Procedures  /  Critical Care Procedures  ED Course and Medical Decision Making  Initial Impression and Ddx Frequent ED visitor, per chart review also recently visiting the behavioral health urgent care.  Admitting that he  is seeking medical care because he has nowhere else to go, requested a shower at the [facility] earlier today.  Vital signs are normal, he moves all extremities equally, doubt emergent process.  Past medical/surgical history that increases complexity of ED encounter: Schizophrenia  Interpretation of Diagnostics Laboratory and/or imaging options to aid in the diagnosis/care of the patient were considered.  After careful history and physical examination, it was determined that there was no indication for diagnostics at this time.  Patient Reassessment and Ultimate Disposition/Management     Discharge  Patient management required discussion with the following services or consulting groups:  None  Complexity of Problems Addressed Acute complicated illness or Injury  Additional Data Reviewed and  Analyzed Further history obtained from: Past medical history and medications listed in the EMR and Prior ED visit notes  Additional Factors Impacting ED Encounter Risk None  Barth Kirks. Sedonia Small, MD [place] [email or web address]  Final Clinical Impressions(s) / ED Diagnoses     ICD-10-CM   1. Blurred vision  H53.8       ED Discharge Orders     None        Discharge Instructions Discussed with and Provided to Patient:    Discharge Instructions      You were evaluated in the Emergency Department and after careful evaluation, we did not find any emergent condition requiring admission or further testing in the hospital.  Your exam/testing today is overall reassuring.  Please return to the Emergency Department if you experience any worsening of your condition.   Thank you for allowing [place] to be a part of your care.      Maudie Flakes, MD 01/07/23 [phone]

## 2023-01-07 NOTE — Discharge Instructions (Signed)
You were evaluated in the Emergency Department and after careful evaluation, we did not find any emergent condition requiring admission or further testing in the hospital.  Your exam/testing today is overall reassuring.  Please return to the Emergency Department if you experience any worsening of your condition.   Thank you for allowing us to be a part of your care. 

## 2023-01-25 ENCOUNTER — Emergency Department (HOSPITAL_COMMUNITY)
Admission: EM | Admit: 2023-01-25 | Discharge: 2023-01-25 | Discharge status: Against Medical Advice | Payer: Medicaid Other | Attending: Emergency Medicine | Admitting: Emergency Medicine

## 2023-01-25 ENCOUNTER — Ambulatory Visit (HOSPITAL_COMMUNITY)
Admission: EM | Admit: 2023-01-25 | Discharge: 2023-01-25 | Disposition: A | Discharge status: Home / Self Care | Payer: Medicaid Other | Attending: Psychiatry | Admitting: Psychiatry

## 2023-01-25 ENCOUNTER — Encounter (HOSPITAL_COMMUNITY): Payer: Self-pay | Admitting: Emergency Medicine

## 2023-01-25 DIAGNOSIS — Z765 Malingerer [conscious simulation]: Secondary | ICD-10-CM | POA: Insufficient documentation

## 2023-01-25 DIAGNOSIS — Z5321 Procedure and treatment not carried out due to patient leaving prior to being seen by health care provider: Secondary | ICD-10-CM | POA: Diagnosis not present

## 2023-01-25 DIAGNOSIS — R0602 Shortness of breath: Secondary | ICD-10-CM | POA: Insufficient documentation

## 2023-01-25 DIAGNOSIS — R5383 Other fatigue: Secondary | ICD-10-CM | POA: Insufficient documentation

## 2023-01-25 DIAGNOSIS — F209 Schizophrenia, unspecified: Secondary | ICD-10-CM | POA: Insufficient documentation

## 2023-01-25 NOTE — Progress Notes (Signed)
   01/25/23 2138  [facility] Triage Screening (Walk-ins at [facility] only)  How Did You Hear About [place]? Self  What Is the Reason for Your Visit/Call Today? Pt is a 33 year old male who presents to [facility] voluntarily. Pt reports feeling tired. Pt denies SI/HI. Pt reports needing somewhere safe to sleep. Pt denied additional assessment questions.  How Long Has This Been Causing You Problems? > than 6 months  Have You Recently Had Any Thoughts About Hurting Yourself? No  Are You Planning to Commit Suicide/Harm Yourself At This time? No  Have you Recently Had Thoughts About Hurting Someone [name]? No  Are You Planning To Harm Someone At This Time? No  Are you currently experiencing any auditory, visual or other hallucinations? No  Have You Used Any Alcohol or Drugs in the Past 24 Hours? No  Do you have any current medical co-morbidities that require immediate attention? No  Clinician description of patient physical appearance/behavior: Pt was guarded and not cooperative. Pt appeared dishelved and poorly groomed. Pt was Ox4.  What Do You Feel Would Help You the Most Today? Housing Assistance;Financial Resources  If access to [facility] Urgent Care was not available, would you have sought care in the Emergency Department? No  Determination of Need Routine (7 days)  Options For Referral Outpatient Therapy    Flowsheet Row ED from 01/25/2023 in [facility] Most recent reading at 01/25/2023  9:41 PM ED from 01/25/2023 in [facility] Emergency Department at [facility] Most recent reading at 01/25/2023  9:06 PM Pre-admit (Canceled) from [facility]22/2021 [facility] Most recent reading at 02/22/2021  3:47 AM  C-SSRS RISK CATEGORY No Risk No Risk No Risk

## 2023-01-25 NOTE — ED Triage Notes (Addendum)
Pt from [facility]. States difficulty breathing. Sats WNL. No wheezing or dyspnea noted. Unable to tell EMS when it started. Pt not engaging with EMS or this RN when asked what brings him in today. Denies SI or HI and states "need chill pill". RN asked if he is feeling anxious- he denies. RN asked what he would need from the doctor and he said "I dont know". Pt then got up and walked out of room and headed to waiting room.

## 2023-01-25 NOTE — ED Provider Notes (Signed)
Behavioral Health Urgent Care Medical Screening Exam  Patient Name: [name] MRN: [id number] Date of Evaluation: 01/26/23 Chief Complaint:  need some food and rest Diagnosis:  Final diagnoses:  Malingering    History of Present illness: [name] is a 32 y.o. male. With a history of schizophrenia, malingering, homelessness.  Presented to [facility] voluntarily.  Per the patient he is hungry needs some food and needs to get some sleep.  Patient is well-known to the ED and has multiple visits in a very short extended time.  Copied from TTS: [name]: Pt is a 32 year old male who presents to [facility] voluntarily. Pt reports feeling tired. Pt denies SI/HI. Pt reports needing somewhere safe to sleep. Pt denied additional assessment questions. Pt is routine.  Face-to-face observation of patient, patient is alert and oriented x 4, speech is clear, maintaining eye contact.  Patient is calm and cooperative, mood is a euthymic affect is flat.  patient denies SI, HI, AVH or paranoia according to patient he is hungry needs some food and he wants to get some sleep.  When asked if he is going to go to his apartment patient's area and trying to get there.  Recommend discharge for patient to f/u with ACT team   Flowsheet Row ED from 01/25/2023 in [facility] Most recent reading at 01/25/2023  9:41 PM ED from 01/25/2023 in [facility] Emergency Department at [facility] Most recent reading at 01/25/2023  9:06 PM ED from 01/06/2023 in [facility] Emergency Department at [facility] Most recent reading at 01/06/2023 11:44 PM  C-SSRS RISK CATEGORY No Risk No Risk No Risk       Psychiatric Specialty Exam  Presentation  General Appearance:Casual  Eye Contact:Good  Speech:Clear and Coherent  Speech Volume:Normal  Handedness:Right   Mood and Affect  Mood: Euthymic  Affect: Flat   Thought Process  Thought Processes: Coherent  Descriptions of  Associations:Intact  Orientation:Full (Time, Place and Person)  Thought Content:WDL  Diagnosis of Schizophrenia or Schizoaffective disorder in past: Yes  Duration of Psychotic Symptoms: Greater than six months  Hallucinations:None denies AVH  Ideas of Reference:None  Suicidal Thoughts:No  Homicidal Thoughts:No   Sensorium  Memory: Immediate Fair  Judgment: Poor  Insight: Poor   Executive Functions  Concentration: Poor  Attention Span: Fair  Recall: Fair  Fund of Knowledge: Poor  Language: Good   Psychomotor Activity  Psychomotor Activity: Normal   Assets  Assets: Desire for Improvement; Housing   Sleep  Sleep: Poor  Number of hours:  -1   Physical Exam: Physical Exam HENT:     Head: Normocephalic.     Nose: Nose normal.  Cardiovascular:     Rate and Rhythm: Normal rate.  Pulmonary:     Effort: Pulmonary effort is normal.  Musculoskeletal:        General: Normal range of motion.     Cervical back: Normal range of motion.  Neurological:     General: No focal deficit present.     Mental Status: He is alert.  Psychiatric:        Mood and Affect: Mood normal.        Behavior: Behavior normal.        Thought Content: Thought content normal.        Judgment: Judgment normal.    Review of Systems  Constitutional: Negative.   HENT: Negative.    Eyes: Negative.   Respiratory: Negative.    Cardiovascular: Negative.  Gastrointestinal: Negative.   Genitourinary: Negative.   Musculoskeletal: Negative.   Skin: Negative.   Neurological: Negative.   Psychiatric/Behavioral:  The patient is nervous/anxious.    There were no vitals taken for this visit. There is no height or weight on file to calculate BMI.  Musculoskeletal: Strength & Muscle Tone: within normal limits Gait & Station: normal Patient leans: N/A   [facility] MSE Discharge Disposition for Follow up and Recommendations: Based on my evaluation the patient does not appear to  have an emergency medical condition and can be discharged with resources and follow up care in outpatient services for Individual Therapy   Sindy Guadeloupe, NP 01/26/2023, 6:13 AM

## 2023-01-25 NOTE — Discharge Instructions (Signed)
F/u with ACT team 

## 2023-02-03 ENCOUNTER — Ambulatory Visit (HOSPITAL_COMMUNITY)
Admission: EM | Admit: 2023-02-03 | Discharge: 2023-02-03 | Disposition: A | Discharge status: Home / Self Care | Payer: Medicaid Other | Attending: Urology | Admitting: Urology

## 2023-02-03 DIAGNOSIS — Z765 Malingerer [conscious simulation]: Secondary | ICD-10-CM

## 2023-02-03 DIAGNOSIS — F200 Paranoid schizophrenia: Secondary | ICD-10-CM

## 2023-02-03 NOTE — ED Triage Notes (Signed)
Pt presents to [facility] voluntarily. Pt reports being tired and needing somewhere to sleep. Pt denies SI/HI and AVH. Pt does not report any mental health symptoms at this time. Pt is routine.

## 2023-02-03 NOTE — ED Provider Notes (Signed)
Behavioral Health Urgent Care Medical Screening Exam  Patient Name: [name] MRN: [id number] Date of Evaluation: 02/03/23 Chief Complaint:   Diagnosis:  Final diagnoses:  Malingering  Paranoid schizophrenia (H[facility])    History of Present illness: [name] is a 32 y.o. male. ***  Flowsheet Row ED from 01/25/2023 in [facility] Most recent reading at 01/25/2023  9:41 PM ED from 01/25/2023 in [facility] Emergency Department at [facility] Most recent reading at 01/25/2023  9:06 PM Pre-admit (Canceled) from 02/22/2021 [facility] Most recent reading at 02/22/2021  3:47 AM  C-SSRS RISK CATEGORY No Risk No Risk No Risk       Psychiatric Specialty Exam  Presentation  General Appearance:Appropriate for Environment  Eye Contact:Good  Speech:Clear and Coherent  Speech Volume:Normal  Handedness:Right   Mood and Affect  Mood: Euthymic  Affect: Congruent   Thought Process  Thought Processes: Coherent; Linear  Descriptions of Associations:Intact  Orientation:Full (Time, Place and Person)  Thought Content:WDL  Diagnosis of Schizophrenia or Schizoaffective disorder in past: Yes  Duration of Psychotic Symptoms: Greater than six months  Hallucinations:None denies AVH  Ideas of Reference:None  Suicidal Thoughts:No  Homicidal Thoughts:No   Sensorium  Memory: Immediate Good; Recent Good; Remote Good  Judgment: Fair  Insight: Lacking   Executive Functions  Concentration: Poor  Attention Span: Poor  Recall: Poor  Fund of Knowledge: Poor  Language: Fair   Psychomotor Activity  Psychomotor Activity: Normal   Assets  Assets: Communication Skills; Desire for Improvement   Sleep  Sleep: Poor  Number of hours:  3   Physical Exam: Physical Exam ROS Blood pressure 119/80, pulse 95, temperature 98 F (36.7 C), temperature source Oral, resp. rate 18, SpO2 97 %.  There is no height or weight on file to calculate BMI.  Musculoskeletal: Strength & Muscle Tone: {desc; muscle tone:32375} Gait & Station: {PE GAIT ED NATL:22525} Patient leans: {Patient Leans:[id number]}   [facility] MSE Discharge Disposition for Follow up and Recommendations: {[facility] MSE Recommendations:24277}   Haydn Hutsell A Zakkery Dorian, NP 02/03/2023, 2:47 AM

## 2023-02-03 NOTE — ED Notes (Signed)
Patient refused to listen to any discharge instructions provided and refused AVS. Patient visibly agitated for being awoken from room and refused to pick up trash that he had disposed of throughout the entire assessment room that he was in to include his wrappers/cups of items that were supplied to him. Patient escorted to front lobby, all personal belongings from locker given to guest.

## 2023-02-18 ENCOUNTER — Ambulatory Visit (HOSPITAL_COMMUNITY)
Admission: EM | Admit: 2023-02-18 | Discharge: 2023-02-18 | Discharge status: Against Medical Advice | Payer: Medicaid Other

## 2023-02-18 NOTE — BH Assessment (Signed)
TTS went to lobby to triage [name]. He says he is not here for an assessment, that he wants sleep in the lobby. Told him if he changes his mind and would like a mental health assessment to notify the receptionist.   Pamalee Leyden, [facility], [facility] Triage Specialist [phone]

## 2023-02-18 NOTE — ED Notes (Signed)
Patient refused to be triage.

## 2023-02-18 NOTE — ED Provider Notes (Signed)
Patient is seen lying down on a bench in the lobby. He is sleeping and rouses to verbal prompts. He is no in any distress. This provider approached him and ask if he needed assistance. Patient says that he came to [facility] because he wanted to sleep. He repeatedly denied medical and psychiatric need. Patient was escorted out of the lobby by security.

## 2023-02-20 ENCOUNTER — Ambulatory Visit (HOSPITAL_COMMUNITY)
Admission: EM | Admit: 2023-02-20 | Discharge: 2023-02-20 | Disposition: A | Discharge status: Home / Self Care | Payer: Medicaid Other

## 2023-02-20 ENCOUNTER — Encounter (HOSPITAL_COMMUNITY): Payer: Self-pay

## 2023-02-20 ENCOUNTER — Emergency Department (HOSPITAL_COMMUNITY)
Admission: EM | Admit: 2023-02-20 | Discharge: 2023-02-21 | Disposition: A | Discharge status: Home / Self Care | Payer: Medicaid Other | Source: Home / Self Care | Attending: Student | Admitting: Student

## 2023-02-20 ENCOUNTER — Emergency Department (HOSPITAL_COMMUNITY)
Admission: EM | Admit: 2023-02-20 | Discharge: 2023-02-20 | Disposition: A | Discharge status: Home / Self Care | Payer: Medicaid Other | Attending: Emergency Medicine | Admitting: Emergency Medicine

## 2023-02-20 ENCOUNTER — Other Ambulatory Visit: Payer: Self-pay

## 2023-02-20 DIAGNOSIS — Z765 Malingerer [conscious simulation]: Secondary | ICD-10-CM | POA: Diagnosis not present

## 2023-02-20 DIAGNOSIS — Z5900 Homelessness unspecified: Secondary | ICD-10-CM | POA: Insufficient documentation

## 2023-02-20 DIAGNOSIS — Z1152 Encounter for screening for COVID-19: Secondary | ICD-10-CM | POA: Diagnosis not present

## 2023-02-20 DIAGNOSIS — R062 Wheezing: Secondary | ICD-10-CM | POA: Insufficient documentation

## 2023-02-20 NOTE — ED Notes (Signed)
Pt entered the building requesting food and wanting to rest. Pt indicated that he did not want to be seen. This writer was unable to triage. Pt left the building shortly after being registered.

## 2023-02-20 NOTE — ED Triage Notes (Signed)
BIBA from gas station for difficulty breathing and cold exposure, denies being sick or around anyone that was sick, stated to EMS he is high and wants to sleep

## 2023-02-20 NOTE — ED Provider Notes (Signed)
[place] EMERGENCY DEPARTMENT AT [facility] Provider Note   CSN: [id number] Arrival date & time: 02/20/23  0125     History  No chief complaint on file.   [name] is a 32 y.o. male.  HPI   Patient with medical history including polysubstance use, cannabis use, schizophrenia, housing instability presenting with complaints of cold exposure.  Patient states he told EMS that he is having difficulty breathing and that he was cold, denies any recent sick contacts.  On my evaluation patient was found sleeping, patient states that he is here because he just needs a place to stay and get some sleep he is not endorsing any chest pain, shortness of breath, no stomach  pain no nausea vomiting he has no other complaints.  Home Medications Prior to Admission medications   Medication Sig Start Date End Date Taking? Authorizing Provider  hydrOXYzine (ATARAX) 25 MG tablet Take 1 tablet (25 mg total) by mouth every 6 (six) hours. Patient not taking: Reported on 07/30/2022 02/24/22   Arthor Captain, PA-C  paliperidone (INVEGA) 6 MG 24 hr tablet Take 1 tablet (6 mg total) by mouth daily for 14 days. Patient not taking: No sig reported 01/02/21 02/22/21  Mare Ferrari, PA-C      Allergies    Shellfish allergy and Bee pollen    Review of Systems   Review of Systems  Constitutional:  Negative for chills and fever.  Respiratory:  Negative for shortness of breath.   Cardiovascular:  Negative for chest pain.  Gastrointestinal:  Negative for abdominal pain.  Neurological:  Negative for headaches.    Physical Exam Updated Vital Signs BP 120/73   Pulse 61   Temp 97.6 F (36.4 C) (Oral)   Resp 18   Ht 5\' 5"  (1.651 m)   Wt 74.8 kg   SpO2 97%   BMI 27.46 kg/m  Physical Exam Vitals and nursing note reviewed.  Constitutional:      General: He is not in acute distress.    Appearance: He is not ill-appearing.  HENT:     Head: Normocephalic and atraumatic.     Nose: No  congestion.  Eyes:     Conjunctiva/sclera: Conjunctivae normal.  Cardiovascular:     Rate and Rhythm: Normal rate and regular rhythm.  Pulmonary:     Effort: Pulmonary effort is normal.  Skin:    General: Skin is warm and dry.  Neurological:     Mental Status: He is alert.  Psychiatric:        Mood and Affect: Mood normal.     ED Results / Procedures / Treatments   Labs (all labs ordered are listed, but only abnormal results are displayed) Labs Reviewed - No data to display  EKG None  Radiology No results found.  Procedures Procedures    Medications Ordered in ED Medications - No data to display  ED Course/ Medical Decision Making/ A&P                             Medical Decision Making  This patient presents to the ED for concern of shortness of breath, this involves an extensive number of treatment options, and is a complaint that carries with it a high risk of complications and morbidity.  The differential diagnosis includes ACS, PE, pneumonia    Additional history obtained:  Additional history obtained from EMS External records from outside source obtained and reviewed including recent  ER notes   Co morbidities that complicate the patient evaluation  Psychiatric disorder  Social Determinants of Health:  House instability    Lab Tests:  I Ordered, and personally interpreted labs.  The pertinent results include:  n/a   Imaging Studies ordered:  I ordered imaging studies including n/a  I independently visualized and interpreted imaging which showed n/a I agree with the radiologist interpretation   Cardiac Monitoring:  The patient was maintained on a cardiac monitor.  I personally viewed and interpreted the cardiac monitored which showed an underlying rhythm of: n/a   Medicines ordered and prescription drug management:  I ordered medication including n/a I have reviewed the patients home medicines and have made adjustments as  needed  Critical Interventions:  N/a   Reevaluation:  Presents with shortness of breath upon my evaluation patient Dors that he has no shortness of breath he is here because he is homeless and he needs a place to stay.  Patient slept in the ER, was and remained stable, patient is ready for discharge.  Consultations Obtained:  N/a    Test Considered:  N/a    Rule out I have low suspicion for ACS as history is atypical, patient has no cardiac history, not endorsing chest pain shortness of breath.  Low suspicion for PE as he is PERC negative.  Low suspicion for AAA or aortic dissection as history is atypical, patient has low risk factors.  Low suspicion for systemic infection as patient is nontoxic-appearing, vital signs reassuring, no obvious source infection noted on exam.     Dispostion and problem list  After consideration of the diagnostic results and the patients response to treatment, I feel that the patent would benefit from discharge.  Malingering-patient is here for secondary gain, will refer him to outpatient resources within the community i.e. shelters, financial resources.            Final Clinical Impression(s) / ED Diagnoses Final diagnoses:  Malingering    Rx / DC Orders ED Discharge Orders     None         Carroll Sage, PA-C 02/20/23 1610    Mardene Sayer, MD 02/20/23 [phone]

## 2023-02-20 NOTE — Evaluation (Signed)
Behavioral Health Note  Date and Time: 02/20/2023 8:17 AM Name: Dois Billing MRN:  [id number]  [name] is a 32 y/o male who presents to [facility] requesting a place to sleep. Patient requested to be seen by a provider. On interview, patient reports that he is tired that he just needed sleep. He denies low mood, SI, HI, or audio-visual hallucinations.  He denies feeling unsafe. He did not provide information about is current living situation, denies being homeless, but states "I don't know" when asked for specifics. He declines when offered assistance for shelter placement or other resources. Per nursing, he currently lives with his aunt, and comes in often to request a place to sleep and something to eat. No somatic complaints.   Clois Dupes, MS4  I personally was present and performed or re-performed the history, physical exam and medical decision-making activities of this service and have verified that the service and findings are accurately documented in the student's note.  Dr. Liston Alba, MD PGY-1, Psychiatry Residency

## 2023-02-20 NOTE — Discharge Instructions (Signed)
I have given you resources for shelters within the area please review.  Come back to the emergency department if you develop chest pain, shortness of breath, severe abdominal pain, uncontrolled nausea, vomiting, diarrhea.

## 2023-02-21 ENCOUNTER — Emergency Department (HOSPITAL_COMMUNITY): Payer: Medicaid Other

## 2023-02-21 LAB — SARS CORONAVIRUS 2 BY RT PCR: SARS Coronavirus 2 by RT PCR: NEGATIVE

## 2023-02-21 MED ORDER — ALBUTEROL SULFATE HFA 108 (90 BASE) MCG/ACT IN AERS
2.0000 | INHALATION_SPRAY | Freq: Once | RESPIRATORY_TRACT | Status: AC
  Administered 2023-02-21: 2 via RESPIRATORY_TRACT
  Filled 2023-02-21: qty 6.7

## 2023-02-21 MED ORDER — ALBUTEROL SULFATE (2.5 MG/3ML) 0.083% IN NEBU
2.5000 mg | INHALATION_SOLUTION | Freq: Once | RESPIRATORY_TRACT | Status: AC
  Administered 2023-02-21: 2.5 mg via RESPIRATORY_TRACT
  Filled 2023-02-21: qty 3

## 2023-02-21 NOTE — ED Provider Notes (Signed)
[place] EMERGENCY DEPARTMENT AT [facility] Provider Note   CSN: [id number] Arrival date & time: 02/20/23  2352     History  Chief Complaint  Patient presents with   Cough    Pt [place] ems from gas station stating he needed his cough to stop     Julian Male is a 32 y.o. male.  HPI 32 year old male with a history of substance use disorder, schizophrenia, malingering, homelessness presents to the ER with complaints of cough.  Was here yesterday with the same complaint.  States he has a history of asthma.  He is not very participatory in the interview and is answering in one-word answers.  He does not appear in any distress.    Home Medications Prior to Admission medications   Medication Sig Start Date End Date Taking? Authorizing Provider  hydrOXYzine (ATARAX) 25 MG tablet Take 1 tablet (25 mg total) by mouth every 6 (six) hours. Patient not taking: Reported on 07/30/2022 02/24/22   Arthor Captain, PA-C  paliperidone (INVEGA) 6 MG 24 hr tablet Take 1 tablet (6 mg total) by mouth daily for 14 days. Patient not taking: No sig reported 01/02/21 02/22/21  Mare Ferrari, PA-C      Allergies    Shellfish allergy and Bee pollen    Review of Systems   Review of Systems Ten systems reviewed and are negative for acute change, except as noted in the HPI.   Physical Exam Updated Vital Signs BP 130/88 (BP Location: Left Arm)   Pulse (!) 57   Temp 98 F (36.7 C) (Oral)   Resp 17   Ht 5\' 7"  (1.702 m)   Wt 75 kg   SpO2 96%   BMI 25.90 kg/m  Physical Exam Vitals and nursing note reviewed.  Constitutional:      General: He is not in acute distress.    Appearance: He is well-developed.  HENT:     Head: Normocephalic and atraumatic.  Eyes:     Conjunctiva/sclera: Conjunctivae normal.  Cardiovascular:     Rate and Rhythm: Normal rate and regular rhythm.     Heart sounds: No murmur heard. Pulmonary:     Effort: Pulmonary effort is normal. No respiratory  distress.     Breath sounds: Wheezing present.     Comments: Scattered wheezes in the upper lobes Abdominal:     Palpations: Abdomen is soft.     Tenderness: There is no abdominal tenderness.  Musculoskeletal:        General: No swelling.     Cervical back: Neck supple.  Skin:    General: Skin is warm and dry.     Capillary Refill: Capillary refill takes less than 2 seconds.  Neurological:     General: No focal deficit present.     Mental Status: He is alert and oriented to person, place, and time.  Psychiatric:        Mood and Affect: Mood normal.     ED Results / Procedures / Treatments   Labs (all labs ordered are listed, but only abnormal results are displayed) Labs Reviewed  SARS CORONAVIRUS 2 BY RT PCR    EKG None  Radiology DG Chest 2 View  Result Date: 02/21/2023 CLINICAL DATA:  Cough and congestion EXAM: CHEST - 2 VIEW COMPARISON:  12/23/2022 FINDINGS: The heart size and mediastinal contours are within normal limits. Both lungs are clear. The visualized skeletal structures are unremarkable. IMPRESSION: No active cardiopulmonary disease. Electronically Signed   By:  Alcide Clever M.D.   On: 02/21/2023 00:38    Procedures Procedures    Medications Ordered in ED Medications  albuterol (PROVENTIL) (2.5 MG/3ML) 0.083% nebulizer solution 2.5 mg (2.5 mg Nebulization Given 02/21/23 0409)  albuterol (VENTOLIN HFA) 108 (90 Base) MCG/ACT inhaler 2 puff (2 puffs Inhalation Given 02/21/23 0409)    ED Course/ Medical Decision Making/ A&P                             Medical Decision Making Risk Prescription drug management.   32 year old gentleman presenting with shortness of breath, seen here yesterday with same complaint. Vitals are overall reassuring.  He is very curt and is only answering in yes or no answers.  He does have wheezes in the upper lobes and does report a history of asthma.  He was given a albuterol neb and albuterol to take home.  Wheezing did improve  after nebulizer.  I have low suspicion for PE, chest x-ray reviewed, no evidence of pneumonia.  Negative COVID and flu.  We discussed return precautions.  Stable for discharge. Final Clinical Impression(s) / ED Diagnoses Final diagnoses:  Wheezing    Rx / DC Orders ED Discharge Orders     None         Mare Ferrari, PA-C 02/21/23 1610    Glendora Score, MD 02/21/23 1731

## 2023-02-21 NOTE — ED Provider Triage Note (Signed)
Emergency Medicine Provider Triage Evaluation Note  [name] , a 32 y.o. male  was evaluated in triage.  Pt complains of "same thing as last night".  States has been coughing.  Cough is nonproductive.  Denies chest pain, shortness of breath, nausea, vomiting, fever.  Review of Systems  Positive: Cough Negative: Chest pain, shortness of breath  Physical Exam  BP 134/77   Pulse 68   Temp 97.8 F (36.6 C) (Oral)   Resp 18   Ht 5\' 7"  (1.702 m)   Wt 75 kg   SpO2 96%   BMI 25.90 kg/m  Gen:   Awake, no distress sleeping on floor, no distress Resp:  Normal effort  MSK:   Moves extremities without difficulty  Other:  Clear lungs  Medical Decision Making  Medically screening exam initiated at 12:11 AM.  Appropriate orders placed.  [name] was informed that the remainder of the evaluation will be completed by another provider, this initial triage assessment does not replace that evaluation, and the importance of remaining in the ED until their evaluation is complete.  Suspicion for secondary gain.   Adrian Octave, MD 02/21/23 [phone]

## 2023-02-21 NOTE — Discharge Instructions (Addendum)
Please use your albuterol inhaler as needed for wheezing.  Please follow-up with [facility] which [facility] a [facility] clinic in the area.  Return to the ER for any new or worsening symptoms.

## 2023-02-22 ENCOUNTER — Ambulatory Visit (HOSPITAL_COMMUNITY)
Admission: EM | Admit: 2023-02-22 | Discharge: 2023-02-22 | Disposition: A | Discharge status: Home / Self Care | Payer: Medicaid Other

## 2023-02-22 NOTE — BH Assessment (Addendum)
Clinician observed patient approach the front desk. Patient requested to be seen.  Patient refused to provide the front desk staff with necessary information to complete his registration. Front desk staff asked patient several times his name, spelling, and birth date. Patient did not provide the necessary information for registration. Despite efforts he was uncooperative with the registration process. Patient informed staff that he was sleepy. Patient got up and and walked out the building.

## 2023-02-22 NOTE — Progress Notes (Signed)
   02/22/23 0415  [facility] Triage Screening (Walk-ins at [facility] only)  What Is the Reason for Your Visit/Call Today? Patient presents as a voluntary walk-in to [facility]. When asked, how can we help you, patient states "I don't know, I am tired". Patient denied SI, HI, psychosis and alcohol/drug usage. Patient denied being depressed and stated he was not a threat to himself or anyone else. Patient reported not taking any psych medications. Patient stated he was not seeing a psychiatrist for medication management and unable to recall last time he was seen. Patient refused to answer any more questions and stated he was leaving. Patient signed waiver and left.  How Long Has This Been Causing You Problems?  ("don't know")  Have You Recently Had Any Thoughts About Hurting Yourself? No  Are You Planning to Commit Suicide/Harm Yourself At This time? No  Have you Recently Had Thoughts About Hurting Someone [name]? No  Are You Planning To Harm Someone At This Time? No  Explanation: n/a  Have You Used Any Alcohol or Drugs in the Past 24 Hours? No  What Did You Use and How Much? n/a  Do you have any current medical co-morbidities that require immediate attention? No  Clinician description of patient physical appearance/behavior: neat / anxious and tired  What Do You Feel Would Help You the Most Today?  ("I don't know")  If access to [facility] was not available, would you have sought care in the Emergency Department? No  Determination of Need Routine (7 days)  Options For Referral Medication Management;Outpatient Therapy    Flowsheet Row ED from 02/22/2023 in [facility] ED from 02/20/2023 in [facility] Emergency Department at [facility] Pre-admit (Canceled) from [facility]  C-SSRS RISK CATEGORY No Risk No Risk No Risk

## 2023-02-23 ENCOUNTER — Ambulatory Visit (HOSPITAL_COMMUNITY)
Admission: EM | Admit: 2023-02-23 | Discharge: 2023-02-23 | Discharge status: Against Medical Advice | Payer: Medicaid Other

## 2023-02-24 ENCOUNTER — Ambulatory Visit (HOSPITAL_COMMUNITY)
Admission: EM | Admit: 2023-02-24 | Discharge: 2023-02-24 | Disposition: A | Discharge status: Home / Self Care | Payer: Medicaid Other

## 2023-02-24 NOTE — ED Notes (Signed)
Pt came into facility@0358  am and was sitting out in the waiting area. Writer asked pt if he wanted to be seen by a NP to get some help he said no and got up and walked out. Np Ene Ajibola was made aware of pt leaving the building

## 2023-02-26 DIAGNOSIS — F259 Schizoaffective disorder, unspecified: Secondary | ICD-10-CM | POA: Insufficient documentation

## 2023-02-26 DIAGNOSIS — Z765 Malingerer [conscious simulation]: Secondary | ICD-10-CM | POA: Insufficient documentation

## 2023-02-27 ENCOUNTER — Ambulatory Visit (HOSPITAL_COMMUNITY)
Admission: EM | Admit: 2023-02-27 | Discharge: 2023-02-27 | Disposition: A | Discharge status: Home / Self Care | Payer: Medicaid Other | Attending: Psychiatry | Admitting: Psychiatry

## 2023-02-27 DIAGNOSIS — F209 Schizophrenia, unspecified: Secondary | ICD-10-CM

## 2023-02-27 DIAGNOSIS — Z765 Malingerer [conscious simulation]: Secondary | ICD-10-CM

## 2023-02-27 NOTE — Progress Notes (Signed)
   02/27/23 0001  [facility] Triage Screening (Walk-ins at [facility] only)  How Did You Hear About [place]? Self  What Is the Reason for Your Visit/Call Today? Melchor Drown is a 32 year old single male who presents voluntarily to [facility] with a complaint of being unable to sleep. Patient denies SI, HI, auditory or visual hallucinations. Patient denies substance use.  How Long Has This Been Causing You Problems? <Week  Have You Recently Had Any Thoughts About Hurting Yourself? No  Are You Planning to Commit Suicide/Harm Yourself At This time? No  Have you Recently Had Thoughts About Hurting Someone [name]? No  Are You Planning To Harm Someone At This Time? No  Explanation: N/A  Are you currently experiencing any auditory, visual or other hallucinations? No  Have You Used Any Alcohol or Drugs in the Past 24 Hours? No  What Did You Use and How Much? N/A  Do you have any current medical co-morbidities that require immediate attention? No  Clinician description of patient physical appearance/behavior: Patient presents dressed casually, disheveled, alert and oriented. Patient has mumbled speech and lacks eye contact. There is no indication patient is responding to internal stimuli.  What Do You Feel Would Help You the Most Today? Medication(s)  Determination of Need Routine (7 days)  Options For Referral Other: Comment (Outpatient resources.)

## 2023-02-27 NOTE — ED Provider Notes (Signed)
Behavioral Health Urgent Care Medical Screening Exam  Patient Name: [name] MRN: [id number] Date of Evaluation: 02/27/23 Chief Complaint:  I need a hoodie  Diagnosis:  Final diagnoses:  Malingering  Schizophrenia, unspecified type (HCC)    History of Present illness: [name] is a 32 y.o. male.  With a history of schizoaffective disorder, malingering, paranoia.  Presented to [facility] voluntarily per the patient he needs a hoodie to put on because he is cold and he needs some food.  Patient does have ACT team.  Review of patient records show patient has been to the ER on numerous occasions asking for food.  Patient does not appear to be in any distress at this time.  Patient does live alone and have ACT team that follows him.  Face-to-face observation of patient, patient is alert and oriented x 4, speech is clear however patient talking at monotone.  Patient denies SI, HI, AVH or paranoia.  Denies alcohol use.  Per the patient he just need a hoodie to put on because he is cold and he also need a sandwich.  Patient does appear to be malingering.  Patient is educated and verbalized understanding of mental health resources and other crisis services in the community patient is instructed to call 911 and presented to the nearest emergency room should he experience any suicidal thoughts or auditory or visual hallucination. Upon completion of this assessment patient was brought mentally and medically stable to be discharged and should follow up with his ACT team.   Recommend discharge  Flowsheet Row ED from 02/26/2023 in [facility] ED from 02/22/2023 in [facility] ED from 02/20/2023 in [facility] Emergency Department at [facility]  C-SSRS RISK CATEGORY No Risk No Risk No Risk       Psychiatric Specialty Exam  Presentation  General Appearance:Casual  Eye Contact:Good  Speech:Garbled  Speech  Volume:Decreased  Handedness:Right   Mood and Affect  Mood: Anxious  Affect: Congruent   Thought Process  Thought Processes: Coherent  Descriptions of Associations:Intact  Orientation:Full (Time, Place and Person)  Thought Content:WDL  Diagnosis of Schizophrenia or Schizoaffective disorder in past: Yes  Duration of Psychotic Symptoms: Greater than six months  Hallucinations:None  Ideas of Reference:None  Suicidal Thoughts:No  Homicidal Thoughts:No   Sensorium  Memory: Immediate Fair  Judgment: Fair  Insight: Lacking   Executive Functions  Concentration: Poor  Attention Span: Fair  Recall: Poor  Fund of Knowledge: Poor  Language: Poor   Psychomotor Activity  Psychomotor Activity: Normal   Assets  Assets: Manufacturing systems engineer; Desire for Improvement   Sleep  Sleep: Poor  Number of hours:  3   Physical Exam: Physical Exam HENT:     Head: Normocephalic.     Nose: Nose normal.  Cardiovascular:     Rate and Rhythm: Normal rate.  Pulmonary:     Effort: Pulmonary effort is normal.  Musculoskeletal:        General: Normal range of motion.     Cervical back: Normal range of motion.  Neurological:     General: No focal deficit present.     Mental Status: He is alert.  Psychiatric:        Mood and Affect: Mood normal.    Review of Systems  Constitutional: Negative.   HENT: Negative.    Eyes: Negative.   Respiratory: Negative.    Cardiovascular: Negative.   Gastrointestinal: Negative.   Genitourinary: Negative.   Musculoskeletal: Negative.  Skin: Negative.   Neurological: Negative.   Psychiatric/Behavioral:  The patient is nervous/anxious.    Blood pressure 132/82, pulse 72, temperature 98.3 F (36.8 C), temperature source Oral, resp. rate 18, SpO2 99 %. There is no height or weight on file to calculate BMI.  Musculoskeletal: Strength & Muscle Tone: within normal limits Gait & Station: normal Patient leans:  N/A   BH[facility] MSE Discharge Disposition for Follow up and Recommendations: Based on my evaluation the patient does not appear to have an emergency medical condition and can be discharged with resources and follow up care in outpatient services for Individual Therapy   Sindy Guadeloupe, NP 02/27/2023, 12:12 AM

## 2023-02-27 NOTE — Discharge Instructions (Signed)
Pt to f/u with ACT team

## 2023-02-28 ENCOUNTER — Encounter (HOSPITAL_COMMUNITY): Payer: Self-pay | Admitting: Registered Nurse

## 2023-02-28 ENCOUNTER — Ambulatory Visit (HOSPITAL_COMMUNITY)
Admission: EM | Admit: 2023-02-28 | Discharge: 2023-02-28 | Disposition: A | Discharge status: Home / Self Care | Payer: Medicaid Other | Attending: Registered Nurse | Admitting: Registered Nurse

## 2023-02-28 ENCOUNTER — Ambulatory Visit (HOSPITAL_COMMUNITY)
Admission: EM | Admit: 2023-02-28 | Discharge: 2023-03-01 | Disposition: A | Discharge status: Home / Self Care | Payer: Medicaid Other

## 2023-02-28 DIAGNOSIS — Z765 Malingerer [conscious simulation]: Secondary | ICD-10-CM

## 2023-02-28 DIAGNOSIS — R4689 Other symptoms and signs involving appearance and behavior: Secondary | ICD-10-CM | POA: Insufficient documentation

## 2023-02-28 NOTE — Progress Notes (Signed)
   02/28/23 1524  [facility] Triage Screening (Walk-ins at [facility] only)  What Is the Reason for Your Visit/Call Today? Patient is well known to [facility] staff/providers after numerous visits requesting a place to sleep or requests for food.  Upon discussion of why patient is presenting today, as per usual Romari respoonds to most questions, "I don't know, man.  I don't know."  Today, he admits he does stay with his mother in an apartment.  He appears well-groomed with clean and pressed clothes.  He denies compliants, howvever does ask for food.  He was offered a bus pass, however seems uncertain about how to use the bus system.  Patient then states he will eat the snacks and then he'd like to leave.  How Long Has This Been Causing You Problems? > than 6 months  Have You Recently Had Any Thoughts About Hurting Yourself? No  Are You Planning to Commit Suicide/Harm Yourself At This time? No  Have you Recently Had Thoughts About Hurting Someone [name]? No  Are You Planning To Harm Someone At This Time? No  Explanation: N/A  Are you currently experiencing any auditory, visual or other hallucinations? No  Have You Used Any Alcohol or Drugs in the Past 24 Hours? No  What Did You Use and How Much? N/A  Do you have any current medical co-morbidities that require immediate attention? No  Clinician description of patient physical appearance/behavior: Patient presents for assessment, however does not report any concerns and he does not appear to be in crisis.  Paitent is oriented, however seems to often present with limited insight.  What Do You Feel Would Help You the Most Today? Social Support  If access to [facility] Urgent Care was not available, would you have sought care in the Emergency Department? No  Determination of Need Routine (7 days)  Options For Referral Other: Comment (Patient requests food and a place to rest.)

## 2023-02-28 NOTE — Progress Notes (Signed)
   02/28/23 2230  [facility] Triage Screening (Walk-ins at [facility] only)  How Did You Hear About [place]? Self  What Is the Reason for Your Visit/Call Today? [name] is a 32 year old single male who presents voluntarily to [facility] with a complaint of being unable to sleep. Patient denies SI, HI, auditory or visual hallucinations. Patient denies substance use. Patients states, "I am tired, I am hungry".  How Long Has This Been Causing You Problems? <Week  Have You Recently Had Any Thoughts About Hurting Yourself? No  Are You Planning to Commit Suicide/Harm Yourself At This time? No  Have you Recently Had Thoughts About Hurting Someone [name]? No  Are You Planning To Harm Someone At This Time? No  Explanation: n/a  Are you currently experiencing any auditory, visual or other hallucinations? No  Have You Used Any Alcohol or Drugs in the Past 24 Hours? No  What Did You Use and How Much? n/a  Do you have any current medical co-morbidities that require immediate attention? No  Clinician description of patient physical appearance/behavior: neat / drowsy  What Do You Feel Would Help You the Most Today? Food Assistance  If access to [facility] Urgent Care was not available, would you have sought care in the Emergency Department?  ("I don't know")  Determination of Need Routine (7 days)  Options For Referral Outpatient Therapy;Medication Management;Other: Comment (resources)    Flowsheet Row ED from 02/28/2023 in [facility] ED from 02/27/2023 in [facility] Pre-admit (Canceled) from [facility]/22[facility]20[facility]  C-SSRS RISK CATEGORY No Risk No Risk No Risk

## 2023-02-28 NOTE — Discharge Instructions (Signed)
[facility] Address: [address] [place], [place], [place] [address] Phone: [phone]  Supported Employment The supported employment program is a person-centered, individualized, evidence-based support service that helps members choose, acquire, and maintain competitive employment in our community. This service supports the varying needs of individuals and promotes community inclusion and employment success. Members enrolled in the supported employment program can expect the following:  Development of an individual career plan Community based job placement Job shadowing Job development On-site job Furniture conservator/restorer and support  Supported Education Supported education helps our members receive the education and training they need to achieve their learning and recovery goals. This will assist members with becoming gainfully employed in the job or career of their choice. The program includes assistance with: Registering for disability accommodations Enrolling in school and registering for classes Learning communication skills Scheduling tutoring sessions within your school [facility] partners with [employer] to help increase the success of clients seeking employment and educational goals.  Want to learn more about our programs?   Please contact our intake department INTAKE: [phone] Ext [phone]  Mailing: [address] [address]   [place], [place] [address]   [email or web address]            [facility]: Outpatient psychiatric Services:   Please see the walk in hours listed below.  Medication Management New Patient needing Medication Management Walk-in, and Existing Patients needing to see a provider for management coming as a walk in   Monday thru Friday 8:00 AM first come first serve until slots are full.  Recommend being there by 7:15 AM to ensure a slot is open.  Therapy New Patient Therapy Intake and Existing Patients  needing to see therapist coming in as a walk in.   Monday, Wednesday, and Thursday morning at 8:00 am first come first serve.  Recommend being there by 7:15 AM to ensure a slot is open.    Every 1st, 2nd, and 3rd Friday at 1:00 PM first come first serve until slots are full.  Will still need to come in that morning at 7:15 AM to get registered for an afternoon slot.  For all walk-ins we ask that you arrive by 7:15 am because patients will be seen in there order of arrival (FIRST COME FIRST SERVE) Availability is limited, therefore you may not be seen on the same day that you walk in if all slots are full.    Our goal is to serve and meet the needs of our community to the best of our ability.

## 2023-02-28 NOTE — BH Assessment (Signed)
  [name] is a 32 year old single male who presents voluntarily to [facility] with a complaint of being unable to sleep. Patient denies SI, HI, auditory or visual hallucinations. Patient denies substance use. Patients states, "I am tired, I am hungry".

## 2023-02-28 NOTE — ED Provider Notes (Signed)
Behavioral Health Urgent Care Medical Screening Exam  Patient Name: [name] MRN: [id number] Date of Evaluation: 02/28/23 Chief Complaint:   Diagnosis:  Final diagnoses:  None    History of Present illness: [name] is a 32 y.o. male patient presented to [facility] as a walk in with complaints that he needed to get to [facility], 32 y.o., male patient seen face to face by this provider, consulted with Dr. Nelly Rout; and chart reviewed on 02/28/23.  On evaluation [name] asked what brought him in today and he first says "I don't know" and then states "I'm trying to get to [facility]. with my home boy."  Asked if he had called to see if friend could pick him up "I don't want to talk about that right now.  I need to get to the back." (Referring to continuous assessment unit or [facility]."  He denies suicidal/self-harm/homicidal ideation, psychosis, and paranoia.  He states he is not interested in psychiatrist or a therapist.  He states that he lives with his mother and not homeless.  Patient then states he is ready to leave after he was given something to eat.   During evaluation [name] is sitting on bench seat with head resting in hands.  There is no noted distress.  He is alert/oriented x 3, calm, and cooperative.  Conversation liner and most questions answered with "I don't know" but other were appropriate to assessment questions.  He spoke in a clear tone at decreased volume at normal pace with minimal eye contact.   He denies suicidal/self-harm/homicidal ideation, psychosis, and paranoia.  Objectively:  there is no evidence of psychosis/mania or delusional thinking.  At this time [name] is educated and verbalizes understanding of mental health resources and other crisis services in the community. He is instructed to call 911 and present to the nearest emergency room should he experience any suicidal/homicidal ideation, auditory/visual/hallucinations, or  detrimental worsening of his mental health condition.  He is given Facilities manager Row ED from 02/27/2023 in [facility] ED from 02/22/2023 in [facility] Pre-admit (Canceled) from 02/22/2021 in [facility]  C-SSRS RISK CATEGORY No Risk No Risk No Risk       Psychiatric Specialty Exam  Presentation  General Appearance:Appropriate for Environment; Casual  Eye Contact:Minimal  Speech:Clear and Coherent  Speech Volume:Decreased  Handedness:Right   Mood and Affect  Mood: Dysphoric  Affect: Congruent   Thought Process  Thought Processes: Coherent  Descriptions of Associations:Intact  Orientation:Full (Time, Place and Person)  Thought Content:WDL  Diagnosis of Schizophrenia or Schizoaffective disorder in past: No  Duration of Psychotic Symptoms: No data recorded Hallucinations:None  Ideas of Reference:None  Suicidal Thoughts:No  Homicidal Thoughts:No   Sensorium  Memory: Immediate Fair; Recent Fair  Judgment: Fair  Insight: Lacking   Executive Functions  Concentration: Fair  Attention Span: Fair  Recall: [employer] of Knowledge: Fair  Language: Fair   Psychomotor Activity  Psychomotor Activity: Normal   Assets  Assets: Manufacturing systems engineer; Housing; Social Support; Leisure Time; Physical Health   Sleep  Sleep: Fair  Number of hours:  3   Physical Exam: Physical Exam Vitals and nursing note reviewed.  Constitutional:      General: He is not in acute distress.    Appearance: Normal appearance. He is not ill-appearing.  HENT:     Head: Normocephalic.  Eyes:  Conjunctiva/sclera: Conjunctivae normal.  Cardiovascular:     Rate and Rhythm: Normal rate.  Pulmonary:     Effort: Pulmonary effort is normal. No respiratory distress.  Musculoskeletal:        General: Normal range of motion.     Cervical back: Normal  range of motion.  Skin:    General: Skin is warm and dry.  Neurological:     Mental Status: He is alert and oriented to person, place, and time.  Psychiatric:        Attention and Perception: Attention and perception normal. He does not perceive auditory or visual hallucinations.        Mood and Affect: Mood is depressed.        Speech: Speech normal.        Behavior: Behavior normal. Behavior is cooperative.        Thought Content: Thought content normal. Thought content is not paranoid or delusional. Thought content does not include homicidal or suicidal ideation.        Judgment: Judgment is impulsive.    Review of Systems  Constitutional:        No other verbal complaints voiced  Psychiatric/Behavioral:  Depression: Denies. Hallucinations: Denies. Substance abuse: Denies. Suicidal ideas: Denies. Nervous/anxious: Denies.        States he need to get a ride to [place]   All other systems reviewed and are negative.  Blood pressure 122/84, pulse 72, temperature 98.5 F (36.9 C), temperature source Oral, resp. rate 18, SpO2 100 %. There is no height or weight on file to calculate BMI.  Musculoskeletal: Strength & Muscle Tone: within normal limits Gait & Station: normal Patient leans: N/A   [facility] MSE Discharge Disposition for Follow up and Recommendations: Based on my evaluation the patient does not appear to have an emergency medical condition and can be discharged with resources and follow up care in outpatient services for Medication Management and Individual Therapy   Kacee Koren, NP 02/28/2023, 3:31 PM

## 2023-02-28 NOTE — Progress Notes (Signed)
   02/28/23 2356  [facility] Triage Screening (Walk-ins at [facility] only)  How Did You Hear About [place]? Self  What Is the Reason for Your Visit/Call Today? Pt presents to [facility] voluntarily, unaccompanied requesting food and snacks. Pt was at [facility] earlier today and discharged. Pt denies SI, HI, AVH and substance/alcohol use.  How Long Has This Been Causing You Problems? <Week  Have You Recently Had Any Thoughts About Hurting Yourself? No  Are You Planning to Commit Suicide/Harm Yourself At This time? No  Have you Recently Had Thoughts About Hurting Someone [name]? No  Are You Planning To Harm Someone At This Time? No  Are you currently experiencing any auditory, visual or other hallucinations? No  Have You Used Any Alcohol or Drugs in the Past 24 Hours? No  Do you have any current medical co-morbidities that require immediate attention? No  Clinician description of patient physical appearance/behavior: drowsy  What Do You Feel Would Help You the Most Today? Food Assistance;Housing Assistance  Determination of Need Routine (7 days)  Options For Referral Outpatient Therapy;Medication Management;Other: Comment

## 2023-03-01 NOTE — ED Provider Notes (Addendum)
Behavioral Health Urgent Care Medical Screening Exam  Patient Name: [name] MRN: [id number] Date of Evaluation: 03/01/23 Chief Complaint:  "I'm hungry, I need some food" Diagnosis:  Final diagnoses:  Malingering    History of Present illness: [name] is a 32 y.o. male patient with a psychiatric history of schizoaffective disorder, malingering,  and paranoia. He presented to [facility] as a walk in with complaints that he needed food and sleep. On evaluation [name] asked what brought him in today and he says "I'm hungry, I need some food" and also states that he is sleepy. When asked where he normally sleeps, he states at his moms house. Review of chart shows multiple ER visits requesting food and somewhere to sleep. Chart also notes that [name] has an ACT team that follows him.   During evaluation [name] is sitting in chair with his head on his lap, he reports it is cold. He is not in any distress.  He is alert/oriented x 3, calm, and cooperative.  Conversation liner and most questions answered with "I don't know" some answers provided where appropriate to assessment questions.  He spoke in a clear tone at decreased volume at normal pace with minimal eye contact.   He denies suicidal/self-harm/homicidal ideation, psychosis, and paranoia.  Objectively:  there is no evidence of psychosis/mania or delusional thinking.   [name] is educated and verbalizes understanding of mental health resources and other crisis services in the community. He is instructed to call 911 and present to the nearest emergency room should he experience any suicidal/homicidal ideation, auditory/visual/hallucinations, or detrimental worsening of his mental health condition. Patient does appear to be malingering. Upon completion of this assessment [name] is psychiatrically and medically stable to be discharged and should follow up with his ACT team.     Flowsheet Row ED from 02/28/2023 in [facility] ED from 02/27/2023 in [facility] Pre-admit (Canceled) from 02/22/2021 in [facility]  C-SSRS RISK CATEGORY No Risk No Risk No Risk       Psychiatric Specialty Exam  Presentation  General Appearance:Casual  Eye Contact:Minimal  Speech:Clear and Coherent  Speech Volume:Decreased  Handedness:Right   Mood and Affect  Mood: Dysphoric  Affect: Congruent   Thought Process  Thought Processes: Coherent  Descriptions of Associations:Intact  Orientation:Full (Time, Place and Person)  Thought Content:WDL  Diagnosis of Schizophrenia or Schizoaffective disorder in past: Yes  Duration of Psychotic Symptoms: No data recorded Hallucinations:None  Ideas of Reference:None  Suicidal Thoughts:No  Homicidal Thoughts:No   Sensorium  Memory: Immediate Fair; Recent Fair; Remote Fair  Judgment: Fair  Insight: Fair   Art therapist  Concentration: Fair  Attention Span: Fair  Recall: [employer] of Knowledge: Fair  Language: Fair   Psychomotor Activity  Psychomotor Activity: Decreased   Assets  Assets: Housing; Leisure Time   Sleep  Sleep: Fair  Number of hours:  3   Physical Exam: Physical Exam Constitutional:      Appearance: He is normal weight.  HENT:     Head: Normocephalic.  Cardiovascular:     Rate and Rhythm: Normal rate.  Pulmonary:     Effort: Pulmonary effort is normal. No respiratory distress.  Abdominal:     General: Abdomen is flat.  Musculoskeletal:        General: Normal range of motion.  Neurological:     General: No focal deficit present.     Mental Status:  He is alert and oriented to person, place, and time.  Psychiatric:        Behavior: Behavior normal.    Review of Systems  Respiratory:  Negative for cough, shortness of breath and wheezing.   Gastrointestinal:  Negative for abdominal pain.  Musculoskeletal:   Negative for falls and myalgias.  Neurological:  Negative for focal weakness and weakness.  Psychiatric/Behavioral:  Negative for hallucinations and suicidal ideas. The patient does not have insomnia.    Blood pressure 129/72, pulse 66, temperature 98.2 F (36.8 C), temperature source Oral, resp. rate 18, SpO2 96 %. There is no height or weight on file to calculate BMI.  Musculoskeletal: Strength & Muscle Tone: within normal limits Gait & Station: normal Patient leans: N/A   [facility] MSE Discharge Disposition for Follow up and Recommendations: Based on my evaluation the patient does not appear to have an emergency medical condition and can be discharged with resources and follow up care in outpatient services for Medication Management and Therapy   Lajoyce Corners, NP 03/01/2023, 12:46 AM

## 2023-03-01 NOTE — Discharge Instructions (Signed)
Follow up with ACT team °

## 2023-03-15 ENCOUNTER — Ambulatory Visit (HOSPITAL_COMMUNITY)
Admission: EM | Admit: 2023-03-15 | Discharge: 2023-03-15 | Discharge status: Against Medical Advice | Payer: Medicaid Other

## 2023-03-15 NOTE — BH Assessment (Signed)
Pt came in without a shirt on asking for a shirt. Pt said he wanted to go to the back and go to sleep. Pt left after getting vitals.

## 2023-03-19 ENCOUNTER — Ambulatory Visit (HOSPITAL_COMMUNITY)
Admission: EM | Admit: 2023-03-19 | Discharge: 2023-03-19 | Disposition: A | Discharge status: Home / Self Care | Payer: Medicaid Other

## 2023-03-19 NOTE — ED Triage Notes (Signed)
Pt presents to [facility] voluntarily. Pt reports being tired and needing somewhere to sleep. Pt denies SI/HI and AVH. Pt does not report any mental health symptoms at this time. Pt is routine.  

## 2023-03-19 NOTE — Progress Notes (Signed)
   03/19/23 0333  [place] Suicide Severity Rating Scale  1. Wish to be Dead No  2. Suicidal Thoughts No  6. Suicide Behavior Question No  C-SSRS RISK CATEGORY No Risk

## 2023-03-19 NOTE — ED Provider Notes (Signed)
Patient presented voluntarily to [facility] requesting food and a place to sleep. This nurse practitioner met with patient face to face in triage room in an attempt to evaluate patient. On assessment, patient reports that he is hungry and tired. When asked what brought him in today" he states "I don't know, my legs hurt, I have to go." Patient then got up and walked out of facility. MSE not completed. Per Triage NT, [address], patient denied SI/HI and AVH.

## 2023-03-19 NOTE — Progress Notes (Signed)
   03/19/23 0329  [facility] Triage Screening (Walk-ins at [facility] only)  How Did You Hear About [place]? Self  What Is the Reason for Your Visit/Call Today? Pt presents to [facility] voluntarily. Pt reports being tired and needing somewhere to sleep. Pt denies SI/HI and AVH. Pt does not report any mental health symptoms at this time. Pt is routine.  How Long Has This Been Causing You Problems? <Week  Have You Recently Had Any Thoughts About Hurting Yourself? No  Are You Planning to Commit Suicide/Harm Yourself At This time? No  Have you Recently Had Thoughts About Hurting Someone [name]? No  Are You Planning To Harm Someone At This Time? No  Are you currently experiencing any auditory, visual or other hallucinations? No  Have You Used Any Alcohol or Drugs in the Past 24 Hours? No  Do you have any current medical co-morbidities that require immediate attention? No  What Do You Feel Would Help You the Most Today? Housing Assistance  If access to [facility] Urgent Care was not available, would you have sought care in the Emergency Department? No  Determination of Need Routine (7 days)  Options For Referral Other: Comment

## 2023-03-21 NOTE — ED Notes (Signed)
Pt left premises without being seen. This Clinical research associate did not complete triage process, vitals or suicide rating scale.

## 2023-03-22 ENCOUNTER — Other Ambulatory Visit: Payer: Self-pay

## 2023-03-22 ENCOUNTER — Emergency Department (HOSPITAL_COMMUNITY)
Admission: EM | Admit: 2023-03-22 | Discharge: 2023-03-22 | Discharge status: Against Medical Advice | Payer: Medicaid Other | Attending: Emergency Medicine | Admitting: Emergency Medicine

## 2023-03-22 ENCOUNTER — Encounter (HOSPITAL_COMMUNITY): Payer: Self-pay

## 2023-03-22 DIAGNOSIS — Z5321 Procedure and treatment not carried out due to patient leaving prior to being seen by health care provider: Secondary | ICD-10-CM | POA: Insufficient documentation

## 2023-03-22 DIAGNOSIS — R0602 Shortness of breath: Secondary | ICD-10-CM | POA: Insufficient documentation

## 2023-03-22 NOTE — ED Triage Notes (Signed)
Patient BIB [facility] from hotel with complaint of SOB.  Patient uncooperative with answering questions in triage.

## 2023-04-03 IMAGING — DX DG CHEST 1V PORT
2 series · 2 of 2 positions shown · non-contrast
Comparison: 06/05/2021

CLINICAL DATA: Shortness of breath.  Wheezing.

EXAM:
PORTABLE CHEST 1 VIEW

[chest ap (1 of 2)]
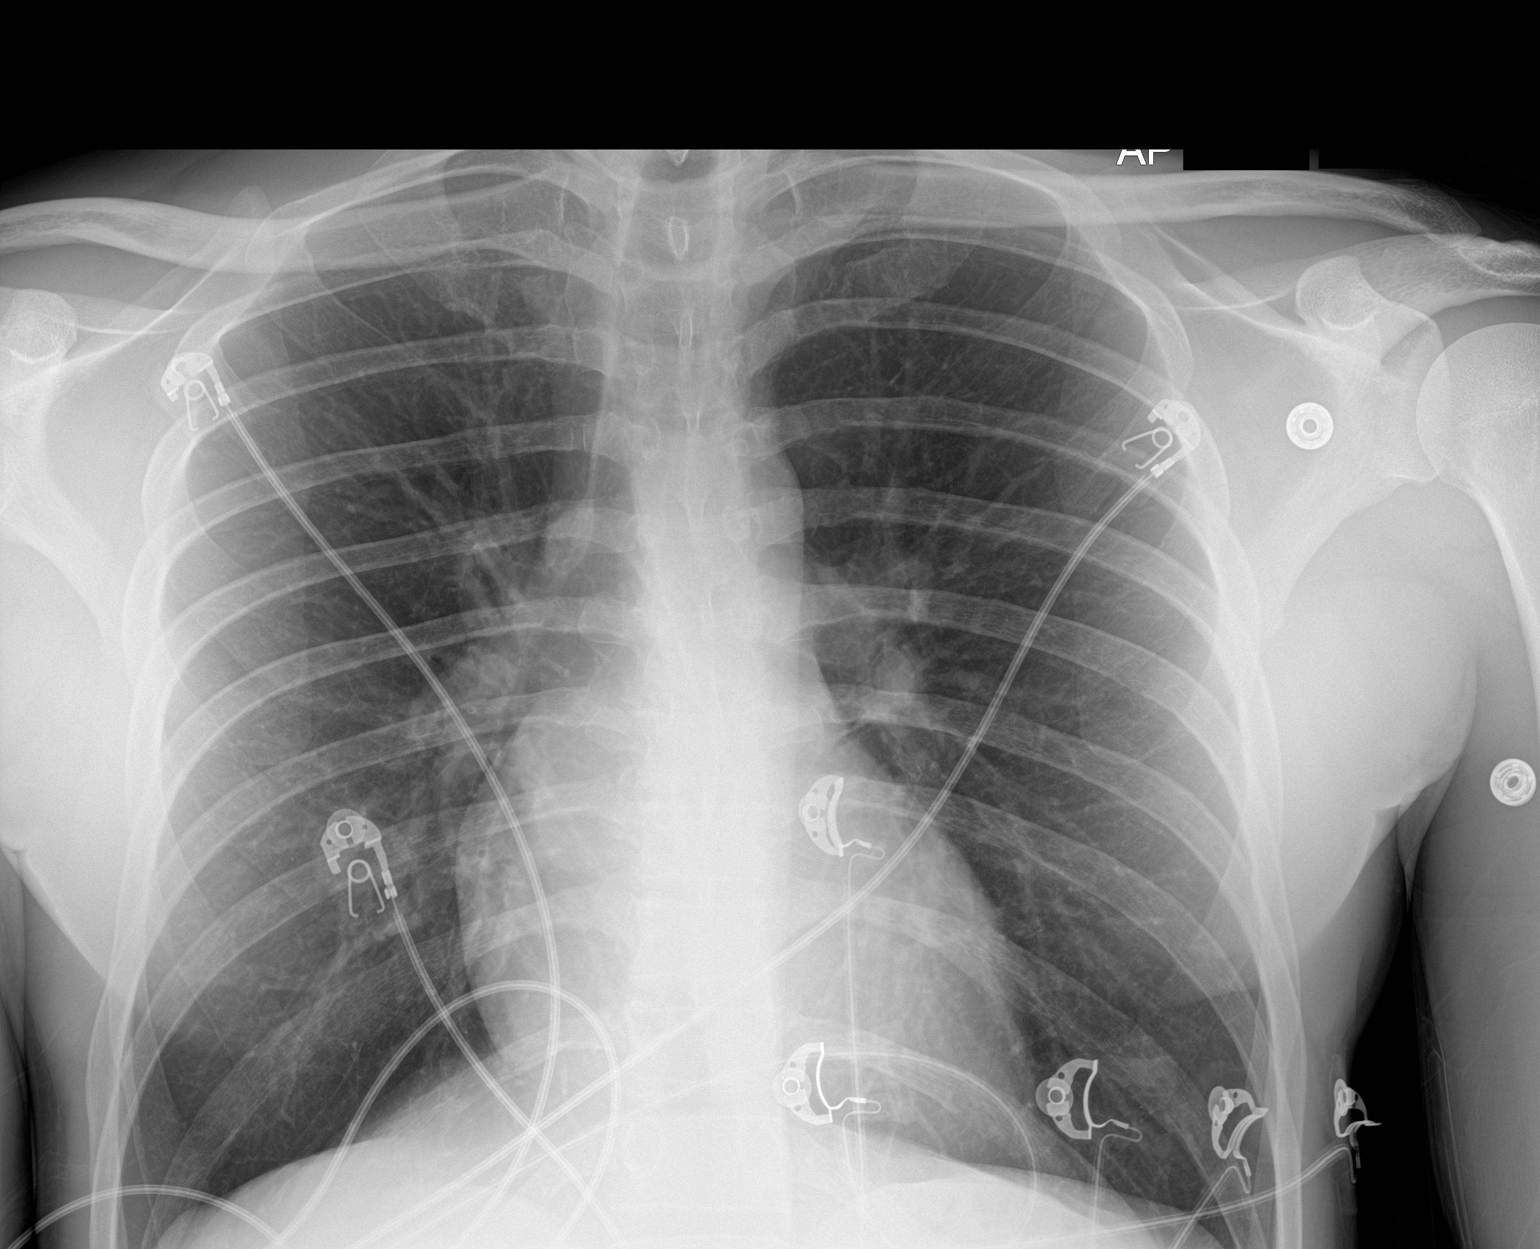

[chest ap (2 of 2)]
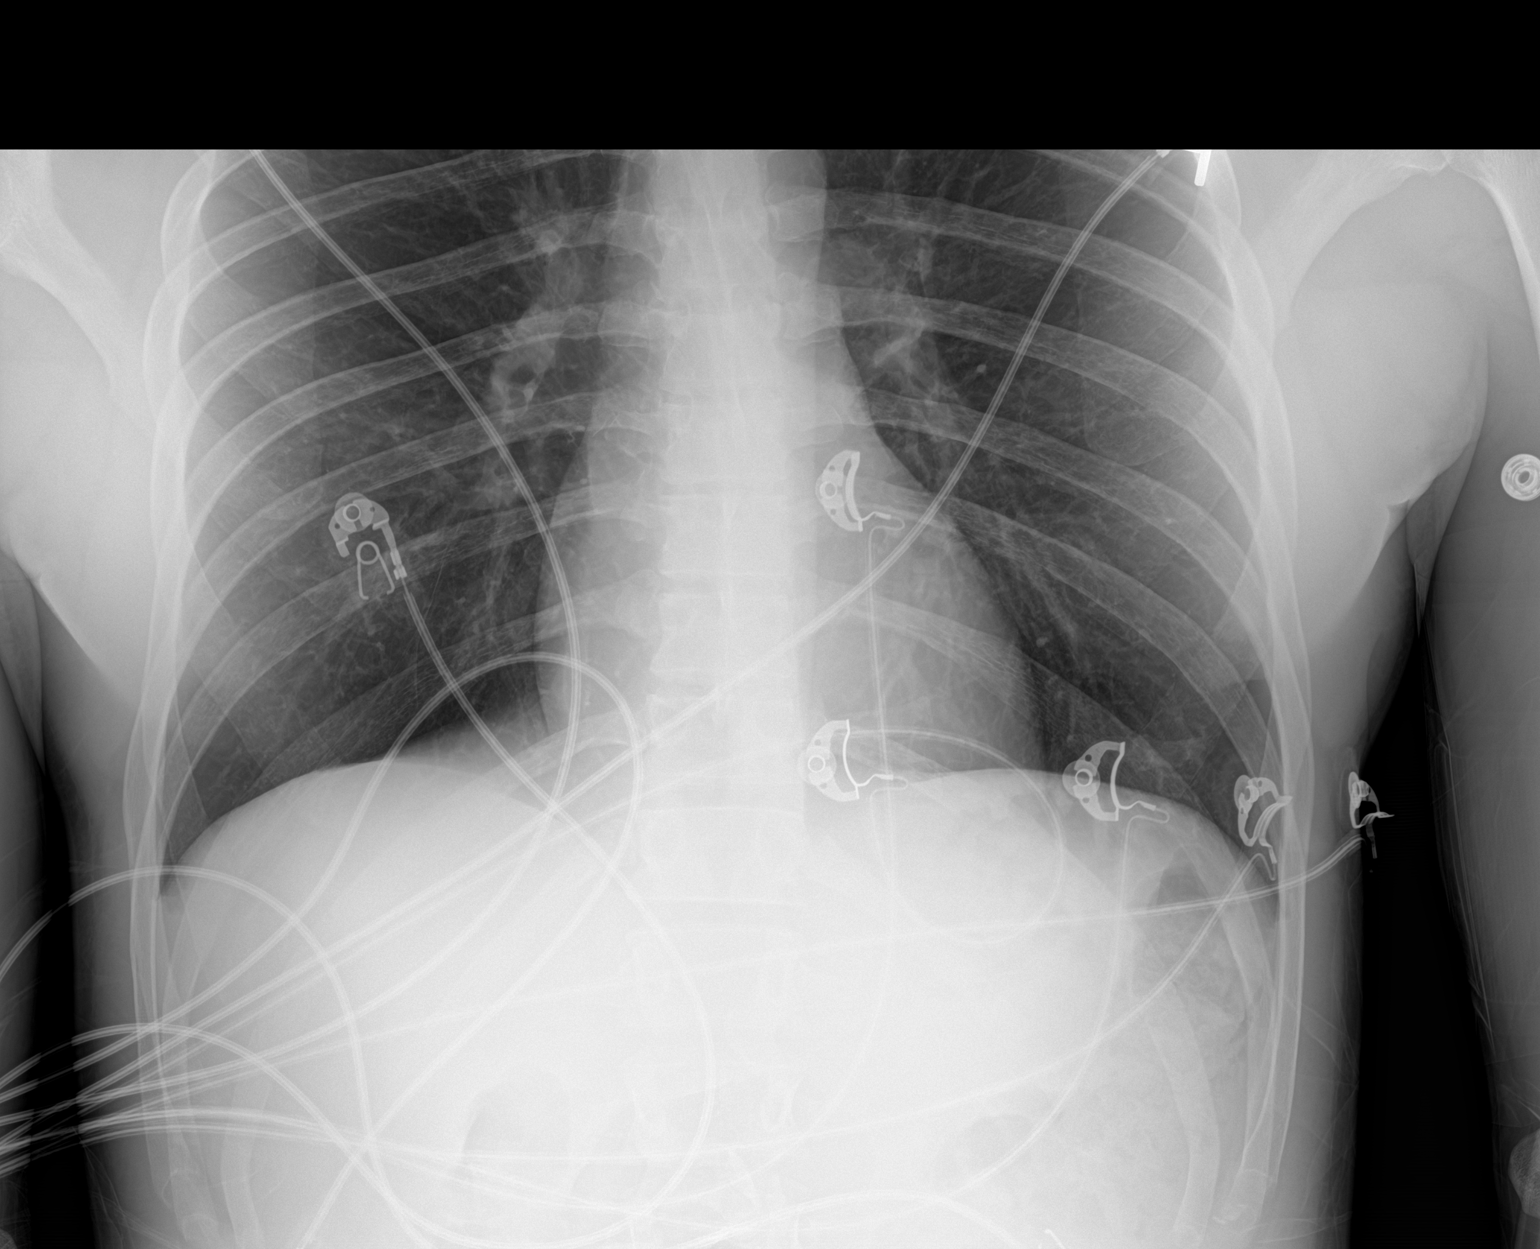

[2 of 2 positions shown; findings below may reference images not displayed]

FINDINGS: Artifact overlies the chest. Heart size is normal. Mediastinal
shadows are normal. The lungs are clear. No infiltrate, collapse or
effusion. Overall normal lung volumes.
IMPRESSION: No active disease.
